# Patient Record
Sex: Female | Born: 1955
Health system: Southern US, Community
[De-identification: ages and names within clinical notes are randomized; demographics above are authoritative.]

## PROBLEM LIST (undated history)

## (undated) ENCOUNTER — Ambulatory Visit: Discharge status: Absent without leave | Payer: PPO

## (undated) DIAGNOSIS — E119 Type 2 diabetes mellitus without complications: Secondary | ICD-10-CM

## (undated) DIAGNOSIS — D509 Iron deficiency anemia, unspecified: Secondary | ICD-10-CM

## (undated) DIAGNOSIS — J449 Chronic obstructive pulmonary disease, unspecified: Secondary | ICD-10-CM

## (undated) DIAGNOSIS — I509 Heart failure, unspecified: Secondary | ICD-10-CM

## (undated) DIAGNOSIS — I739 Peripheral vascular disease, unspecified: Secondary | ICD-10-CM

## (undated) DIAGNOSIS — I493 Ventricular premature depolarization: Secondary | ICD-10-CM

## (undated) DIAGNOSIS — R0600 Dyspnea, unspecified: Secondary | ICD-10-CM

## (undated) DIAGNOSIS — E785 Hyperlipidemia, unspecified: Secondary | ICD-10-CM

## (undated) DIAGNOSIS — K219 Gastro-esophageal reflux disease without esophagitis: Secondary | ICD-10-CM

## (undated) DIAGNOSIS — Z8709 Personal history of other diseases of the respiratory system: Secondary | ICD-10-CM

## (undated) DIAGNOSIS — I10 Essential (primary) hypertension: Secondary | ICD-10-CM

## (undated) DIAGNOSIS — Z5189 Encounter for other specified aftercare: Secondary | ICD-10-CM

## (undated) DIAGNOSIS — Z9109 Other allergy status, other than to drugs and biological substances: Secondary | ICD-10-CM

## (undated) DIAGNOSIS — D649 Anemia, unspecified: Secondary | ICD-10-CM

## (undated) DIAGNOSIS — Z8719 Personal history of other diseases of the digestive system: Secondary | ICD-10-CM

## (undated) DIAGNOSIS — I471 Supraventricular tachycardia, unspecified: Secondary | ICD-10-CM

## (undated) DIAGNOSIS — R519 Headache, unspecified: Secondary | ICD-10-CM

## (undated) DIAGNOSIS — I2510 Atherosclerotic heart disease of native coronary artery without angina pectoris: Secondary | ICD-10-CM

## (undated) DIAGNOSIS — I429 Cardiomyopathy, unspecified: Secondary | ICD-10-CM

## (undated) DIAGNOSIS — M1990 Unspecified osteoarthritis, unspecified site: Secondary | ICD-10-CM

## (undated) DIAGNOSIS — F419 Anxiety disorder, unspecified: Secondary | ICD-10-CM

## (undated) DIAGNOSIS — I70223 Atherosclerosis of native arteries of extremities with rest pain, bilateral legs: Secondary | ICD-10-CM

## (undated) HISTORY — DX: Supraventricular tachycardia, unspecified: I47.10

## (undated) HISTORY — DX: Other allergy status, other than to drugs and biological substances: Z91.09

## (undated) HISTORY — DX: Encounter for other specified aftercare: Z51.89

## (undated) HISTORY — DX: Personal history of other diseases of the digestive system: Z87.19

## (undated) HISTORY — DX: Atherosclerosis of native arteries of extremities with rest pain, bilateral legs: I70.223

## (undated) HISTORY — DX: Chronic obstructive pulmonary disease, unspecified: J44.9

## (undated) HISTORY — DX: Type 2 diabetes mellitus without complications: E11.9

## (undated) HISTORY — DX: Heart failure, unspecified: I50.9

## (undated) HISTORY — DX: Atherosclerotic heart disease of native coronary artery without angina pectoris: I25.10

## (undated) HISTORY — DX: Hyperlipidemia, unspecified: E78.5

## (undated) HISTORY — DX: Supraventricular tachycardia: I47.1

## (undated) HISTORY — DX: Anemia, unspecified: D64.9

## (undated) HISTORY — PX: ABDOMINAL HYSTERECTOMY: SHX81

## (undated) HISTORY — DX: Anxiety disorder, unspecified: F41.9

## (undated) HISTORY — DX: Gastro-esophageal reflux disease without esophagitis: K21.9

## (undated) HISTORY — DX: Ventricular premature depolarization: I49.3

## (undated) HISTORY — DX: Iron deficiency anemia, unspecified: D50.9

---

## 1998-08-11 ENCOUNTER — Ambulatory Visit (HOSPITAL_BASED_OUTPATIENT_CLINIC_OR_DEPARTMENT_OTHER): Admission: RE | Admit: 1998-08-11 | Discharge: 1998-08-11 | Payer: Self-pay | Admitting: General Surgery

## 2001-05-09 ENCOUNTER — Other Ambulatory Visit: Admission: RE | Admit: 2001-05-09 | Discharge: 2001-05-09 | Payer: Self-pay | Admitting: Gynecology

## 2002-04-17 ENCOUNTER — Ambulatory Visit (HOSPITAL_COMMUNITY): Admission: RE | Admit: 2002-04-17 | Discharge: 2002-04-17 | Payer: Self-pay | Admitting: Family Medicine

## 2002-04-17 ENCOUNTER — Encounter: Payer: Self-pay | Admitting: Family Medicine

## 2003-12-03 ENCOUNTER — Other Ambulatory Visit: Admission: RE | Admit: 2003-12-03 | Discharge: 2003-12-03 | Payer: Self-pay | Admitting: Gynecology

## 2004-01-20 ENCOUNTER — Observation Stay (HOSPITAL_COMMUNITY): Admission: RE | Admit: 2004-01-20 | Discharge: 2004-01-21 | Payer: Self-pay | Admitting: Gynecology

## 2005-02-10 ENCOUNTER — Other Ambulatory Visit: Admission: RE | Admit: 2005-02-10 | Discharge: 2005-02-10 | Payer: Self-pay | Admitting: Gynecology

## 2012-03-07 ENCOUNTER — Encounter (HOSPITAL_COMMUNITY): Payer: Self-pay | Admitting: *Deleted

## 2012-03-07 ENCOUNTER — Emergency Department (HOSPITAL_COMMUNITY): Payer: 59

## 2012-03-07 ENCOUNTER — Emergency Department (HOSPITAL_COMMUNITY)
Admission: EM | Admit: 2012-03-07 | Discharge: 2012-03-07 | Disposition: A | Discharge status: Home Health | Payer: 59 | Attending: Emergency Medicine | Admitting: Emergency Medicine

## 2012-03-07 DIAGNOSIS — I10 Essential (primary) hypertension: Secondary | ICD-10-CM | POA: Insufficient documentation

## 2012-03-07 DIAGNOSIS — M542 Cervicalgia: Secondary | ICD-10-CM | POA: Insufficient documentation

## 2012-03-07 DIAGNOSIS — F17200 Nicotine dependence, unspecified, uncomplicated: Secondary | ICD-10-CM | POA: Insufficient documentation

## 2012-03-07 DIAGNOSIS — M25519 Pain in unspecified shoulder: Secondary | ICD-10-CM | POA: Insufficient documentation

## 2012-03-07 DIAGNOSIS — R079 Chest pain, unspecified: Secondary | ICD-10-CM | POA: Insufficient documentation

## 2012-03-07 MED ORDER — ONDANSETRON 4 MG PO TBDP
4.0000 mg | ORAL_TABLET | Freq: Once | ORAL | Status: AC
  Administered 2012-03-07: 4 mg via ORAL
  Filled 2012-03-07: qty 1

## 2012-03-07 MED ORDER — NAPROXEN 375 MG PO TABS: 375.0000 mg | ORAL_TABLET | Freq: Two times a day (BID) | ORAL | Status: DC

## 2012-03-07 MED ORDER — OXYCODONE-ACETAMINOPHEN 5-325 MG PO TABS
2.0000 | ORAL_TABLET | Freq: Once | ORAL | Status: AC
  Administered 2012-03-07: 2 via ORAL
  Filled 2012-03-07 (×2): qty 2

## 2012-03-07 NOTE — ED Provider Notes (Signed)
Medical screening examination/treatment/procedure(s) were performed by non-physician practitioner and as supervising physician I was immediately available for consultation/collaboration.   Dione Booze, MD 03/07/12 [phone]

## 2012-03-07 NOTE — Progress Notes (Signed)
Orthopedic Tech Progress Note Patient Details:  Tammy Mcpherson [birth year] [id number]  Ortho Devices Type of Ortho Device: Arm foam sling Ortho Device/Splint Interventions: Application   Cammer, Mickie Bail 03/07/2012, 12:33 PM

## 2012-03-07 NOTE — ED Notes (Signed)
Patient reports she has had left arm pain, neck pain, and chest pain for 3 days.  She denies trauma.   Patient denies sob

## 2012-03-07 NOTE — Discharge Instructions (Signed)
Come back to ED if you develop fever, rash, chills, worsening pain, numbness or tingling  Acromioclavicular Injuries The AC (acromioclavicular) joint is the joint in the shoulder where the collarbone (clavicle) meets the shoulder blade (scapula). The part of the shoulder blade connected to the collarbone is called the acromion. Common problems with and treatments for the [facility] joint are detailed below. ARTHRITIS Arthritis occurs when the joint has been injured and the smooth padding between the joints (cartilage) is lost. This is the wear and tear seen in most joints of the body if they have been overused. This causes the joint to produce pain and swelling which is worse with activity.  AC JOINT SEPARATION AC joint separation means that the ligaments connecting the acromion of the shoulder blade and collarbone have been damaged, and the two bones no longer line up. AC separations can be anywhere from mild to severe, and are "graded" depending upon which ligaments are torn and how badly they are torn.  Grade I Injury: the least damage is done, and the [facility] joint still lines up.   Grade II Injury: damage to the ligaments which reinforce the [facility] joint. In a Grade II injury, these ligaments are stretched but not entirely torn. When stressed, the [facility] joint becomes painful and unstable.   Grade III Injury: AC and secondary ligaments are completely torn, and the collarbone is no longer attached to the shoulder blade. This results in deformity; a prominence of the end of the clavicle.  AC JOINT FRACTURE AC joint fracture means that there has been a break in the bones of the [facility] joint, usually the end of the clavicle. TREATMENT TREATMENT OF AC ARTHRITIS  There is currently no way to replace the cartilage damaged by arthritis. The best way to improve the condition is to decrease the activities which aggravate the problem. Application of ice to the joint helps decrease pain and soreness (inflammation). The use of  non-steroidal anti-inflammatory medication is helpful.   If less conservative measures do not work, then cortisone shots (injections) may be used. These are anti-inflammatories; they decrease the soreness in the joint and swelling.   If non-surgical measures fail, surgery may be recommended. The procedure is generally removal of a portion of the end of the clavicle. This is the part of the collarbone closest to your acromion which is stabilized with ligaments to the acromion of the shoulder blade. This surgery may be performed using a tube-like instrument with a light (arthroscope) for looking into a joint. It may also be performed as an open surgery through a small incision by the surgeon. Most patients will have good range of motion within 6 weeks and may return to all activity including sports by 8-12 weeks, barring complications.  TREATMENT OF AN AC SEPARATION  The initial treatment is to decrease pain. This is best accomplished by immobilizing the arm in a sling and placing an ice pack to the shoulder for 20 to 30 minutes every 2 hours as needed. As the pain starts to subside, it is important to begin moving the fingers, wrist, elbow and eventually the shoulder in order to prevent a stiff or "frozen" shoulder. Instruction on when and how much to move the shoulder will be provided by your caregiver. The length of time needed to regain full motion and function depends on the amount or grade of the injury. Recovery from a Grade I AC separation usually takes 10 to 14 days, whereas a Grade III may take 6 to  8 weeks.   Grade I and II separations usually do not require surgery. Even Grade III injuries usually allow return to full activity with few restrictions. Treatment is also based on the activity demands of the injured shoulder. For example, a high level quarterback with an injured throwing arm will receive more aggressive treatment than someone with a desk job who rarely uses his/her arm for strenuous  activities. In some cases, a painful lump may persist which could require a later surgery. Surgery can be very successful, but the benefits must be weighed against the potential risks.  TREATMENT OF AN AC JOINT FRACTURE Fracture treatment depends on the type of fracture. Sometimes a splint or sling may be all that is required. Other times surgery may be required for repair. This is more frequently the case when the ligaments supporting the clavicle are completely torn. Your caregiver will help you with these decisions and together you can decide what will be the best treatment. HOME CARE INSTRUCTIONS   Apply ice to the injury for 15 to 20 minutes each hour while awake for 2 days. Put the ice in a plastic bag and place a towel between the bag of ice and skin.   If a sling has been applied, wear it constantly for as long as directed by your caregiver, even at night. The sling or splint can be removed for bathing or showering or as directed. Be sure to keep the shoulder in the same place as when the sling is on. Do not lift the arm.   If a figure-of-eight splint has been applied it should be tightened gently by another person every day. Tighten it enough to keep the shoulders held back. Allow enough room to place the index finger between the body and strap. Loosen the splint immediately if there is numbness or tingling in the hands.   Take over-the-counter or prescription medicines for pain, discomfort or fever as directed by your caregiver.   If you or your child has received a follow up appointment, it is very important to keep that appointment in order to avoid long term complications, chronic pain or disability.  SEEK MEDICAL CARE IF:   The pain is not relieved with medications.   There is increased swelling or discoloration that continues to get worse rather than better.   You or your child has been unable to follow up as instructed.   There is progressive numbness and tingling in the arm,  forearm or hand.  SEEK IMMEDIATE MEDICAL CARE IF:   The arm is numb, cold or pale.   There is increasing pain in the hand, forearm or fingers.  MAKE SURE YOU:   Understand these instructions.   Will watch your condition.   Will get help right away if you are not doing well or get worse.  Document Released: 06/28/2005 Document Revised: 09/07/2011 Document Reviewed: 12/21/2008 [facility] Patient Information 2012 [place], [place].Arthralgia Your caregiver has diagnosed you as suffering from an arthralgia. Arthralgia means there is pain in a joint. This can come from many reasons including:  Bruising the joint which causes soreness (inflammation) in the joint.   Wear and tear on the joints which occur as we grow older (osteoarthritis).   Overusing the joint.   Various forms of arthritis.   Infections of the joint.  Regardless of the cause of pain in your joint, most of these different pains respond to anti-inflammatory drugs and rest. The exception to this is when a joint is infected, and  these cases are treated with antibiotics, if it is a bacterial infection. HOME CARE INSTRUCTIONS   Rest the injured area for as long as directed by your caregiver. Then slowly start using the joint as directed by your caregiver and as the pain allows. Crutches as directed may be useful if the ankles, knees or hips are involved. If the knee was splinted or casted, continue use and care as directed. If an stretchy or elastic wrapping bandage has been applied today, it should be removed and re-applied every 3 to 4 hours. It should not be applied tightly, but firmly enough to keep swelling down. Watch toes and feet for swelling, bluish discoloration, coldness, numbness or excessive pain. If any of these problems (symptoms) occur, remove the ace bandage and re-apply more loosely. If these symptoms persist, contact your caregiver or return to this location.   For the first 24 hours, keep the injured extremity  elevated on pillows while lying down.   Apply ice for 15 to 20 minutes to the sore joint every couple hours while awake for the first half day. Then 3 to 4 times per day for the first 48 hours. Put the ice in a plastic bag and place a towel between the bag of ice and your skin.   Wear any splinting, casting, elastic bandage applications, or slings as instructed.   Only take over-the-counter or prescription medicines for pain, discomfort, or fever as directed by your caregiver. Do not use aspirin immediately after the injury unless instructed by your physician. Aspirin can cause increased bleeding and bruising of the tissues.   If you were given crutches, continue to use them as instructed and do not resume weight bearing on the sore joint until instructed.  Persistent pain and inability to use the sore joint as directed for more than 2 to 3 days are warning signs indicating that you should see a caregiver for a follow-up visit as soon as possible. Initially, a hairline fracture (break in bone) may not be evident on X-rays. Persistent pain and swelling indicate that further evaluation, non-weight bearing or use of the joint (use of crutches or slings as instructed), or further X-rays are indicated. X-rays may sometimes not show a small fracture until a week or 10 days later. Make a follow-up appointment with your own caregiver or one to whom we have referred you. A radiologist (specialist in reading X-rays) may read your X-rays. Make sure you know how you are to obtain your X-ray results. Do not assume everything is normal if you do not hear from [place]. SEEK MEDICAL CARE IF: Bruising, swelling, or pain increases. SEEK IMMEDIATE MEDICAL CARE IF:   Your fingers or toes are numb or blue.   The pain is not responding to medications and continues to stay the same or get worse.   The pain in your joint becomes severe.   You develop a fever over 102 F (38.9 C).   It becomes impossible to move or use the  joint.  MAKE SURE YOU:   Understand these instructions.   Will watch your condition.   Will get help right away if you are not doing well or get worse.  Document Released: 09/18/2005 Document Revised: 09/07/2011 Document Reviewed: 05/06/2008 [facility] Patient Information 2012 [place], [place].Shoulder Pain The shoulder is a ball and socket joint. The muscles and tendons (rotator cuff) are what keep the shoulder in its joint and stable. This collection of muscles and tendons holds in the head (ball) of the  humerus (upper arm bone) in the fossa (cup) of the scapula (shoulder blade). Today no reason was found for your shoulder pain. Often pain in the shoulder may be treated conservatively with temporary immobilization. For example, holding the shoulder in one place using a sling for rest. Physical therapy may be needed if problems continue. HOME CARE INSTRUCTIONS   Apply ice to the sore area for 15 to 20 minutes, 3 to 4 times per day for the first 2 days. Put the ice in a plastic bag. Place a towel between the bag of ice and your skin.   If you have or were given a shoulder sling and straps, do not remove for as long as directed by your caregiver or until you see a caregiver for a follow-up examination. If you need to remove it to shower or bathe, move your arm as little as possible.   Sleep on several pillows at night to lessen swelling and pain.   Only take over-the-counter or prescription medicines for pain, discomfort, or fever as directed by your caregiver.   Keep any follow-up appointments in order to avoid any type of permanent shoulder disability or chronic pain problems.  SEEK MEDICAL CARE IF:   Pain in your shoulder increases or new pain develops in your arm, hand, or fingers.   Your hand or fingers are colder than your other hand.   You do not obtain pain relief with the medications or your pain becomes worse.  SEEK IMMEDIATE MEDICAL CARE IF:   Your arm, hand, or fingers are numb  or tingling.   Your arm, hand, or fingers are swollen, painful, or turn [place] or blue.   You develop chest pain or shortness of breath.  MAKE SURE YOU:   Understand these instructions.   Will watch your condition.   Will get help right away if you are not doing well or get worse.  Document Released: 06/28/2005 Document Revised: 09/07/2011 Document Reviewed: 09/02/2011 [facility] Patient Information 2012 [place], [place].

## 2012-03-07 NOTE — ED Provider Notes (Signed)
History     CSN: [id number]  Arrival date & time 03/07/12  1012   First MD Initiated Contact with Patient 03/07/12 1017      Chief Complaint  Patient presents with  . Arm Pain  . Neck Pain  . Chest Pain    (Consider location/radiation/quality/duration/timing/severity/associated sxs/prior treatment) HPI  Patient to the ED with complaints of left shoulder pain and left wrist pain. She states that it started on Saturday morning when she rolled over in bed and then since then it has been continually getting worse. The patient does not want to move her arm because it hurts to bad. It hurts to touch and to move. She denies weakness and says she can pick things up but does not want to because it hurts too bad. She denies remembering falling or injuring her arm. She denies history of heart problems that she knows of. She is a smoker. No history of shoulder dislocations.No shortness of breath, no chest pain, no generalized weakness, fevers, N/V/D.  Past Medical History  Diagnosis Date  . Hypertension     Past Surgical History  Procedure Date  . Abdominal hysterectomy     No family history on file.  History  Substance Use Topics  . Smoking status: Current Everyday Smoker  . Smokeless tobacco: Not on file  . Alcohol Use: No    OB History    Grav Para Term Preterm Abortions TAB SAB Ect Mult Living                  Review of Systems   HEENT: denies blurry vision or change in hearing PULMONARY: Denies difficulty breathing and SOB CARDIAC: denies chest pain or heart palpitations MUSCULOSKELETAL:  denies being unable to ambulate ABDOMEN AL: denies abdominal pain GU: denies loss of bowel or urinary control NEURO: denies numbness and tingling in extremities SKIN: no new rashes PSYCH: patient behavior is normal NECK: pt does have some neck tenderness    Allergies  Penicillins  Home Medications   Current Outpatient Rx  Name Route Sig Dispense Refill  . NON FORMULARY  Oral Take 1 tablet by mouth daily.    Marland Kitchen NAPROXEN 375 MG PO TABS Oral Take 1 tablet (375 mg total) by mouth 2 (two) times daily. 20 tablet 0    BP 191/96  Pulse 101  Temp(Src) 98.7 F (37.1 C) (Oral)  Resp 16  Ht 5\' 4"  (1.626 m)  Wt 124 lb (56.246 kg)  BMI 21.28 kg/m2  SpO2 96%  Physical Exam  Nursing note and vitals reviewed. Constitutional: She appears well-developed and well-nourished. No distress.  HENT:  Head: Normocephalic and atraumatic.  Eyes: Pupils are equal, round, and reactive to light.  Neck: Normal range of motion. Neck supple.  Cardiovascular: Normal rate and regular rhythm.   Pulmonary/Chest: Effort normal.  Abdominal: Soft.  Musculoskeletal:       Left shoulder: She exhibits decreased range of motion, tenderness and pain. She exhibits no bony tenderness, no swelling, no effusion, no crepitus, no deformity, no laceration, no spasm, normal pulse and normal strength.       Pt has pain to palpation from her left shoulder all the way down through her fingers. Everywhere I touch on her left arm hurts.    Equal grip strength to bilateral upper extremities. Neurosensory  function adequate to both arms. Skin color is normal. Skin is warm and moist. I see no step off deformity of the neck. Pt is able to ambulate without limp.  Pain is relieved when at rest.  ROM is decreased due to pain. No crepitus, laceration, effusion, swelling.  Pulses are normal   Neurological: She is alert.  Skin: Skin is warm and dry.    ED Course  Procedures (including critical care time)  Labs Reviewed - No data to display Dg Shoulder Left  03/07/2012  *RADIOLOGY REPORT*  Clinical Data: Left neck, shoulder and arm pain.  No known injury.  LEFT SHOULDER - 2+ VIEW  Comparison: None.  Findings: Three views of the left shoulder demonstrate an artifact overlying the scapula on the frontal views.  Mild lower cervical spine degenerative changes.  Otherwise, normal appearing bones and soft tissues.   IMPRESSION: Normal appearing left shoulder.  Mild lower cervical spine degenerative changes.  Original Report Authenticated By: Darrol Angel, M.D.     1. Shoulder pain       MDM   Date: 03/07/2012  Rate: 114  Rhythm: sinus tachycardia  QRS Axis: normal  Intervals: normal  ST/T Wave abnormalities: nonspecific ST changes  Conduction Disutrbances:none  Narrative Interpretation: bi atrial enlargment  Old EKG Reviewed: none available  Patient given 2 x Percocet tabs in ED and her pain decreased some moderately.   Patient with left arm pain. No neurological deficits. Patient is ambulatory. No warning symptoms of  pain including:  h/o cancer, IVDU, recent trauma. No concern for cauda equina, epidural abscess, or other serious cause of back pain. Conservative measures such as rest, ice/heat and pain medicine indicated. Patient given referral to Orthopedist and told to call today to schedule an appointment for as soon as possible.  The patient given shoulder sling in ED and Rx for Naproxen TID. I discussed plan with patient in depth. She has been made aware not to stay in sling all day everyday and that she needs to do shoulder exercises at least 15 minutes a day t decrease risk of frozen shoulder.  NO futher imaging done at this time because of lack of neurological deficits and pain relieves with medication. Xray of shoulder shows some mild lower cervical degenerative changes.  I told her that she she gets worsening pain, any weakness, any numbness, any new symptoms to return to the ED sooner for re-evaluation.         Dorthula Matas, PA 03/07/12 1214

## 2012-03-07 NOTE — ED Notes (Signed)
Paged and spoke with Ortho 

## 2012-11-03 ENCOUNTER — Emergency Department (HOSPITAL_COMMUNITY): Payer: 59

## 2012-11-03 ENCOUNTER — Encounter (HOSPITAL_COMMUNITY): Payer: Self-pay | Admitting: Emergency Medicine

## 2012-11-03 ENCOUNTER — Emergency Department (HOSPITAL_COMMUNITY)
Admission: EM | Admit: 2012-11-03 | Discharge: 2012-11-03 | Disposition: A | Discharge status: Home / Self Care | Payer: 59 | Attending: Emergency Medicine | Admitting: Emergency Medicine

## 2012-11-03 DIAGNOSIS — R197 Diarrhea, unspecified: Secondary | ICD-10-CM | POA: Insufficient documentation

## 2012-11-03 DIAGNOSIS — R112 Nausea with vomiting, unspecified: Secondary | ICD-10-CM | POA: Insufficient documentation

## 2012-11-03 DIAGNOSIS — R059 Cough, unspecified: Secondary | ICD-10-CM | POA: Insufficient documentation

## 2012-11-03 DIAGNOSIS — J3489 Other specified disorders of nose and nasal sinuses: Secondary | ICD-10-CM | POA: Insufficient documentation

## 2012-11-03 DIAGNOSIS — E876 Hypokalemia: Secondary | ICD-10-CM | POA: Insufficient documentation

## 2012-11-03 DIAGNOSIS — R6883 Chills (without fever): Secondary | ICD-10-CM | POA: Insufficient documentation

## 2012-11-03 DIAGNOSIS — R5381 Other malaise: Secondary | ICD-10-CM | POA: Insufficient documentation

## 2012-11-03 DIAGNOSIS — E860 Dehydration: Secondary | ICD-10-CM

## 2012-11-03 DIAGNOSIS — R071 Chest pain on breathing: Secondary | ICD-10-CM | POA: Insufficient documentation

## 2012-11-03 DIAGNOSIS — F17200 Nicotine dependence, unspecified, uncomplicated: Secondary | ICD-10-CM | POA: Insufficient documentation

## 2012-11-03 DIAGNOSIS — R05 Cough: Secondary | ICD-10-CM | POA: Insufficient documentation

## 2012-11-03 DIAGNOSIS — I10 Essential (primary) hypertension: Secondary | ICD-10-CM | POA: Insufficient documentation

## 2012-11-03 LAB — BASIC METABOLIC PANEL
Calcium: 9.6 mg/dL (ref 8.4–10.5)
GFR calc Af Amer: >90 mL/min (ref >90)
GFR calc non Af Amer: >90 mL/min (ref >90)
Potassium: 2.8 mEq/L — ABNORMAL LOW (ref 3.5–5.1)
Sodium: 139 mEq/L (ref 135–145)

## 2012-11-03 LAB — URINALYSIS, ROUTINE W REFLEX MICROSCOPIC
Leukocytes, UA: NEGATIVE
Nitrite: NEGATIVE
Protein, ur: NEGATIVE mg/dL
Specific Gravity, Urine: <1.005 — ABNORMAL LOW (ref 1.005–1.030)
Urobilinogen, UA: 0.2 mg/dL (ref 0.0–1.0)

## 2012-11-03 LAB — CBC WITH DIFFERENTIAL/PLATELET
Basophils Relative: 0 % (ref 0–1)
Hemoglobin: 14.4 g/dL (ref 12.0–15.0)
Lymphs Abs: 1.3 10*3/uL (ref 0.7–4.0)
MCHC: 34.2 g/dL (ref 30.0–36.0)
Monocytes Relative: 16 % — ABNORMAL HIGH (ref 3–12)
Neutro Abs: 5.5 10*3/uL (ref 1.7–7.7)
Neutrophils Relative %: 68 % (ref 43–77)
Platelets: 360 10*3/uL (ref 150–400)
RBC: 4.81 MIL/uL (ref 3.87–5.11)

## 2012-11-03 LAB — URINE MICROSCOPIC-ADD ON

## 2012-11-03 MED ORDER — KETOROLAC TROMETHAMINE 30 MG/ML IJ SOLN
30.0000 mg | Freq: Once | INTRAMUSCULAR | Status: AC
  Administered 2012-11-03: 30 mg via INTRAVENOUS
  Filled 2012-11-03: qty 1

## 2012-11-03 MED ORDER — OSELTAMIVIR PHOSPHATE 75 MG PO CAPS: 75.0000 mg | ORAL_CAPSULE | Freq: Two times a day (BID) | ORAL | Status: DC

## 2012-11-03 MED ORDER — POTASSIUM CHLORIDE CRYS ER 20 MEQ PO TBCR
EXTENDED_RELEASE_TABLET | ORAL | Status: AC
  Filled 2012-11-03: qty 2

## 2012-11-03 MED ORDER — MUCINEX DM 30-600 MG PO TB12: ORAL_TABLET | ORAL | Status: DC

## 2012-11-03 MED ORDER — SODIUM CHLORIDE 0.9 % IV SOLN: 1000.0000 mL | INTRAVENOUS | Status: DC

## 2012-11-03 MED ORDER — SODIUM CHLORIDE 0.9 % IV SOLN
1000.0000 mL | Freq: Once | INTRAVENOUS | Status: AC
  Administered 2012-11-03: 1000 mL via INTRAVENOUS

## 2012-11-03 MED ORDER — LOPERAMIDE HCL 2 MG PO CAPS
4.0000 mg | ORAL_CAPSULE | Freq: Once | ORAL | Status: AC
Start: 2012-11-03 — End: 2012-11-03
  Administered 2012-11-03: 4 mg via ORAL
  Filled 2012-11-03: qty 2

## 2012-11-03 MED ORDER — ONDANSETRON 8 MG PO TBDP: 8.0000 mg | ORAL_TABLET | Freq: Three times a day (TID) | ORAL | Status: DC | PRN

## 2012-11-03 MED ORDER — POTASSIUM CHLORIDE ER 10 MEQ PO TBCR: 20.0000 meq | EXTENDED_RELEASE_TABLET | Freq: Two times a day (BID) | ORAL | Status: DC

## 2012-11-03 MED ORDER — POTASSIUM CHLORIDE CRYS ER 20 MEQ PO TBCR
40.0000 meq | EXTENDED_RELEASE_TABLET | Freq: Once | ORAL | Status: AC
  Administered 2012-11-03: 40 meq via ORAL

## 2012-11-03 MED ORDER — ONDANSETRON HCL 4 MG/2ML IJ SOLN
4.0000 mg | Freq: Once | INTRAMUSCULAR | Status: AC
  Administered 2012-11-03: 4 mg via INTRAVENOUS
  Filled 2012-11-03: qty 2

## 2012-11-03 MED ORDER — POTASSIUM CHLORIDE CRYS ER 20 MEQ PO TBCR: 40.0000 meq | EXTENDED_RELEASE_TABLET | Freq: Once | ORAL | Status: DC

## 2012-11-03 NOTE — ED Notes (Signed)
Pt c/o flu like sx x 3 days-n/v/cough/chills with central cp starting this am.

## 2012-11-03 NOTE — ED Notes (Signed)
Will wait approx after zofran given to give loperamide due to nausea.

## 2012-11-03 NOTE — ED Provider Notes (Addendum)
History   This chart was scribed for Ward Givens, MD by Melba Coon, ED Scribe. The patient was seen in room APA19/APA19 and the patient's care was started at 4:38PM.    CSN: [id number]  Arrival date & time 11/03/12  1616   First MD Initiated Contact with Patient 11/03/12 1622      Chief Complaint  Patient presents with  . Cough  . Emesis  . Chest Pain    (Consider location/radiation/quality/duration/timing/severity/associated sxs/prior treatment) The history is provided by the patient. No language interpreter was used.   Tammy Mcpherson is a 57 y.o. female who presents to the Emergency Department complaining of constant, moderate to severe chest pain with some persistent dry coughing, nausea and emesis 5-6 times a day, and watery diarrhea 5-6 times a day with an onset 2 days ago. She reports her chest pain is induced from and aggravated by her coughing; change in body position also aggravates her chest pain. She reports generalized weakness, clear rhinorrhea, and decreased appetite. She reports chills but has not checked her body temperature at home. Her husband had similar symptoms last week and got better when Tammy Mcpherson became sick. Neither Tammy Mcpherson nor her husband has received the flu shot this season. Denies HA, fever, neck pain, sore throat, rash, back pain, abdominal pain, dysuria, or extremity pain, edema, weakness, numbness, or tingling. She reports history of  hypertension but never takes her medications. No other pertinent medical symptoms.  PCP: Tammy Mcpherson (years ago)   Past Medical History  Diagnosis Date  . Hypertension     Past Surgical History  Procedure Date  . Abdominal hysterectomy     No family history on file.  History  Substance Use Topics  . Smoking status: Current Every Day Smoker  . Smokeless tobacco: Not on file  . Alcohol Use: No  She smokes about half a pack a day She works in a warehouse. Lives at home Lives with  spouse  OB History    Grav Para Term Preterm Abortions TAB SAB Ect Mult Living                  Review of Systems  Respiratory: Positive for cough.   Cardiovascular: Positive for chest pain.  Gastrointestinal: Positive for vomiting.   10 Systems reviewed and all are negative for acute change except as noted in the HPI.   Allergies  Penicillins  Home Medications   Current Outpatient Rx  Name  Route  Sig  Dispense  Refill  . PHENYLEPH-CPM-DM-APAP 01-31-09-250 MG PO TBEF   Oral   Take 2 tablets by mouth every 6 (six) hours as needed. Cold/flu           BP 174/68  Pulse 91  Temp 98.1 F (36.7 C) (Oral)  Resp 20  Ht 5\' 5"  (1.651 m)  Wt 126 lb (57.153 kg)  BMI 20.97 kg/m2  SpO2 99%  Vital signs normal except hypertension   Physical Exam  Constitutional: She is oriented to person, place, and time. She appears well-developed and well-nourished.  Non-toxic appearance. She does not appear ill. No distress.  HENT:  Head: Normocephalic and atraumatic.  Right Ear: External ear normal.  Left Ear: External ear normal.  Nose: Nose normal. No mucosal edema or rhinorrhea.  Mouth/Throat: Oropharynx is clear and moist and mucous membranes are normal. No dental abscesses or uvula swelling.       Edentulous. Dry tongue.   Eyes: Conjunctivae normal and EOM  are normal. Pupils are equal, round, and reactive to light.  Neck: Normal range of motion and full passive range of motion without pain. Neck supple.  Cardiovascular: Normal rate, regular rhythm and normal heart sounds.  Exam reveals no gallop and no friction rub.   No murmur heard. Pulmonary/Chest: Effort normal and breath sounds normal. No respiratory distress. She has no wheezes. She has no rhonchi. She has no rales. She exhibits no tenderness and no crepitus.    Abdominal: Soft. Normal appearance and bowel sounds are normal. She exhibits no distension. There is tenderness. There is no rebound and no guarding.       TTP lower  costochondral joints.  Musculoskeletal: Normal range of motion. She exhibits no edema and no tenderness.       Moves all extremities well.   Neurological: She is alert and oriented to person, place, and time. She has normal strength. No cranial nerve deficit.  Skin: Skin is warm, dry and intact. No rash noted. No erythema. No pallor.  Psychiatric: She has a normal mood and affect. Her speech is normal and behavior is normal. Her mood appears not anxious.    ED Course  Procedures (including critical care time)   Medications  0.9 %  sodium chloride infusion (0 mL Intravenous Stopped 11/03/12 1754)    Followed by  0.9 %  sodium chloride infusion (1000 mL Intravenous New Bag/Given 11/03/12 1753)    Followed by  0.9 %  sodium chloride infusion (not administered)  potassium chloride SA (K-DUR,KLOR-CON) CR tablet 40 mEq (not administered)  ondansetron (ZOFRAN) injection 4 mg (4 mg Intravenous Given 11/03/12 1711)  loperamide (IMODIUM) capsule 4 mg (4 mg Oral Given 11/03/12 1753)  ketorolac (TORADOL) 30 MG/ML injection 30 mg (30 mg Intravenous Given 11/03/12 1711)     DIAGNOSTIC STUDIES: Oxygen Saturation is 99% on room air, normal by my interpretation.    COORDINATION OF CARE:  4:43PM - EKG, IV fluids, Zofran, imodium, Toradol, CXR, BMP, CBC with differential, and UA will be ordered for Tammy Mcpherson.   6:42PM - recheck; imaging and lab results reviewed.Imaging was unremarkable except for hypokalemia. Tammy Mcpherson was normal. K is low. Her condition is improved with increased voiding after 2 bags of IV fluids. She will be Rx tamiflu and K supplement pills. She is ready for d/c. Pt feeling better. Feels ready to go home.     Results for orders placed during the hospital encounter of 11/03/12  BASIC METABOLIC PANEL      Component Value Range   Sodium 139  135 - 145 mEq/L   Potassium 2.8 (*) 3.5 - 5.1 mEq/L   Chloride 101  96 - 112 mEq/L   CO2 24  19 - 32 mEq/L   Glucose, Bld 112 (*) 70 - 99  mg/dL   BUN 8  6 - 23 mg/dL   Creatinine, Ser 1.61  0.50 - 1.10 mg/dL   Calcium 9.6  8.4 - 09.6 mg/dL   GFR calc non Af Amer >90  >90 mL/min   GFR calc Af Amer >90  >90 mL/min  CBC WITH DIFFERENTIAL      Component Value Range   WBC 8.1  4.0 - 10.5 K/uL   RBC 4.81  3.87 - 5.11 MIL/uL   Hemoglobin 14.4  12.0 - 15.0 g/dL   HCT 04.5  40.9 - 81.1 %   MCV 87.5  78.0 - 100.0 fL   MCH 29.9  26.0 - 34.0 pg   MCHC  34.2  30.0 - 36.0 g/dL   RDW 16.1  09.6 - 04.5 %   Platelets 360  150 - 400 K/uL   Neutrophils Relative 68  43 - 77 %   Neutro Abs 5.5  1.7 - 7.7 K/uL   Lymphocytes Relative 16  12 - 46 %   Lymphs Abs 1.3  0.7 - 4.0 K/uL   Monocytes Relative 16 (*) 3 - 12 %   Monocytes Absolute 1.3 (*) 0.1 - 1.0 K/uL   Eosinophils Relative 0  0 - 5 %   Eosinophils Absolute 0.0  0.0 - 0.7 K/uL   Basophils Relative 0  0 - 1 %   Basophils Absolute 0.0  0.0 - 0.1 K/uL  URINALYSIS, ROUTINE W REFLEX MICROSCOPIC      Component Value Range   Color, Urine STRAW (*) YELLOW   APPearance CLEAR  CLEAR   Specific Gravity, Urine <1.005 (*) 1.005 - 1.030   pH 6.5  5.0 - 8.0   Glucose, UA NEGATIVE  NEGATIVE mg/dL   Hgb urine dipstick TRACE (*) NEGATIVE   Bilirubin Urine NEGATIVE  NEGATIVE   Ketones, ur NEGATIVE  NEGATIVE mg/dL   Protein, ur NEGATIVE  NEGATIVE mg/dL   Urobilinogen, UA 0.2  0.0 - 1.0 mg/dL   Nitrite NEGATIVE  NEGATIVE   Leukocytes, UA NEGATIVE  NEGATIVE  URINE MICROSCOPIC-ADD ON      Component Value Range   Squamous Epithelial / LPF FEW (*) RARE   WBC, UA 0-2  <3 WBC/hpf   RBC / HPF 0-2  <3 RBC/hpf   Laboratory interpretation all normal except hypokalemia   Dg Chest 2 View  11/03/2012  *RADIOLOGY REPORT*  Clinical Data: Cough.  Emesis.  Chest pain.  Weakness and vomiting.  CHEST - 2 VIEW  Comparison: None.  Findings:  Cardiopericardial silhouette within normal limits. Mediastinal contours normal. Trachea midline.  No airspace disease or effusion.  IMPRESSION: No active cardiopulmonary  disease.   Original Report Authenticated By: Andreas Newport, M.D.     Date: 11/03/2012  Rate: 106  Rhythm: sinus tachycardia  QRS Axis: normal  Intervals: normal  ST/T Wave abnormalities: normal  Conduction Disutrbances:none  Narrative Interpretation:   Old EKG Reviewed: none available      1. Influenza-like illness   2. Vomiting and diarrhea   3. Hypokalemia   4. Dehydration    New Prescriptions   DEXTROMETHORPHAN-GUAIFENESIN (MUCINEX DM) 30-600 MG TB12    1 po BID for coughing   ONDANSETRON (ZOFRAN ODT) 8 MG DISINTEGRATING TABLET    Take 1 tablet (8 mg total) by mouth every 8 (eight) hours as needed for nausea.   OSELTAMIVIR (TAMIFLU) 75 MG CAPSULE    Take 1 capsule (75 mg total) by mouth 2 (two) times daily.   POTASSIUM CHLORIDE (K-DUR) 10 MEQ TABLET    Take 2 tablets (20 mEq total) by mouth 2 (two) times daily.    Plan discharge  Devoria Albe, MD, FACEP    MDM  I personally performed the services described in this documentation, which was scribed in my presence. The recorded information has been reviewed and considered.  Devoria Albe, MD, FACEP         Ward Givens, MD 11/03/12 Avelino Leeds  Ward Givens, MD 11/03/12 [phone]

## 2012-11-03 NOTE — ED Provider Notes (Signed)
We also discussed her hypertension and that it is the "silent killer" and she needs to get back with her doctor and take her BP meds.   Ward Givens, MD 11/03/12 1901

## 2013-06-03 ENCOUNTER — Other Ambulatory Visit: Payer: Self-pay | Admitting: Podiatry

## 2013-06-19 NOTE — Addendum Note (Signed)
Addended by: Kaydenn Mclear on: 06/19/2013 01:01 PM   Modules accepted: Orders  

## 2013-06-24 ENCOUNTER — Other Ambulatory Visit: Payer: Self-pay | Admitting: Podiatry

## 2013-06-27 ENCOUNTER — Encounter (HOSPITAL_COMMUNITY): Payer: Self-pay

## 2013-06-30 NOTE — Patient Instructions (Addendum)
Tammy Mcpherson  06/30/2013   Your procedure is scheduled on:  07/10/13  [facility] to [facility] [place] [facility].  Call this number if you have problems the morning of surgery: [phone]   Remember:   Do not eat food or drink liquids after midnight.   Take these medicines the morning of surgery with A SIP OF WATER: none   Do not wear jewelry, make-up or nail polish.  Do not wear lotions, powders, or perfumes. You may wear deodorant.  Do not shave 48 hours prior to surgery. Men may shave face and neck.  Do not bring valuables to the hospital.  [facility] is not responsible                  for any belongings or valuables.               Contacts, dentures or bridgework may not be worn into surgery.  Leave suitcase in the car. After surgery it may be brought to your room.  For patients admitted to the hospital, discharge time is determined by your                treatment team.               Patients discharged the day of surgery will not be allowed to drive  home.  Name and phone number of your driver: family  Special Instructions: Shower using CHG 2 nights before surgery and the night before surgery.  If you shower the day of surgery use CHG.  Use special wash - you have one bottle of CHG for all showers.  You should use approximately 1/3 of the bottle for each shower.   Please read over the following fact sheets that you were given: Pain Booklet, Coughing and Deep Breathing, Surgical Site Infection Prevention and Anesthesia Post-op Instructions   PATIENT INSTRUCTIONS POST-ANESTHESIA  IMMEDIATELY FOLLOWING SURGERY:  Do not drive or operate machinery for the first twenty four hours after surgery.  Do not make any important decisions for twenty four hours after surgery or while taking narcotic pain medications or sedatives.  If you develop intractable nausea and vomiting or a severe headache please notify your doctor immediately.  FOLLOW-UP:  Please make an appointment with your surgeon as  instructed. You do not need to follow up with anesthesia unless specifically instructed to do so.  WOUND CARE INSTRUCTIONS (if applicable):  Keep a dry clean dressing on the anesthesia/puncture wound site if there is drainage.  Once the wound has quit draining you may leave it open to air.  Generally you should leave the bandage intact for twenty four hours unless there is drainage.  If the epidural site drains for more than 36-48 hours please call the anesthesia department.  QUESTIONS?:  Please feel free to call your physician or the hospital operator if you have any questions, and they will be happy to assist you.      Bunionectomy A bunionectomy is surgery to remove a bunion. A bunion is an enlargement of the joint at the base of the big toe. It is made up of bone and soft tissue on the inside part of the joint. Over time, a painful lump appears on the inside of the joint. The big toe begins to point inward toward the second toe. New bone growth can occur and a bone spur may form. The pain eventually causes difficulty walking. A bunion usually results from inflammation caused by the irritation  of poorly fitting shoes. It often begins later in life. A bunionectomy is performed when nonsurgical treatment no longer works. When surgery is needed, the extent of the procedure will depend on the degree of deformity of the foot. Your surgeon will discuss with you the different procedures and what will work best for you depending on your age and health. LET YOUR CAREGIVER KNOW ABOUT:   Previous problems with anesthetics or medicines used to numb the skin.  Allergies to dyes, iodine, foods, and/or latex.  Medicines taken including herbs, eye drops, prescription medicines (especially medicines used to "thin the blood"), aspirin and other over-the-counter medicines, and steroids (by mouth or as a cream).  History of bleeding or blood problems.  Possibility of pregnancy, if this applies.  History of  blood clots in your legs and/or lungs .  Previous surgery.  Other important health problems. RISKS AND COMPLICATIONS   Infection.  Pain.  Nerve damage.  Possibility that the bunion will recur. BEFORE THE PROCEDURE  You should be present 60 minutes prior to your procedure or as directed.  PROCEDURE  Surgery is often done so that you can go home the same day (outpatient). It may be done in a hospital or in an outpatient surgical center. An anesthetic will be used to help you sleep during the procedure. Sometimes, a spinal anesthetic is used to make you numb below the waist. A cut (incision) is made over the swollen area at the first joint of the big toe. The enlarged lump will be removed. If there is a need to reposition the bones of the big toe, this may require more than 1 incision. The bone itself may need to be cut. Screws and wires may be used in the repair. These can be removed at a later date. In severe cases, the entire joint may need to be removed and a joint replacement inserted. When done, the incision is closed with stitches (sutures). Skin adhesive strips may be added for reinforcement. They help hold the incision closed.  AFTER THE PROCEDURE  Compression bandages (dressings) are then wrapped around the wound. This helps to keep the foot in alignment and reduce swelling. Your foot will be monitored for bleeding and swelling. You will need to stay for a few hours in the recovery area before being discharged. This allows time for the anesthesia to wear off. You will be discharged home when you are awake, stable, and doing well. HOME CARE INSTRUCTIONS   You can expect to return to normal activities within 6 to 8 weeks after surgery. The foot is at increased risk for swelling for several months. When you can expect to bear weight on the operated foot will depend on the extent of your surgery. The milder the deformity, the less tissue is removed and the sooner the return to normal  activity level. During the recovery period, a special shoe, boot, or cast may be worn to accommodate the surgical bandage and to help provide stability to the foot.  Once you are home, an ice pack applied to the operative site may help with discomfort and keep swelling down. Stop using the ice if it causes discomfort.  Keep your feet raised (elevated) when possible to lessen swelling.  If you have an elastic bandage on your foot and you have numbness, tingling, or your foot becomes cold and blue, adjust the bandage to make it comfortable.  Change dressings as directed.  Keep the wound dry and clean. The wound may be washed  gently with soap and water. Gently blot dry without rubbing. Do not take baths or use swimming pools or hot tubs for 10 days, or as instructed by your caregiver.  Only take over-the-counter or prescription medicines for pain, discomfort, or fever as directed by your caregiver.  You may continue a normal diet as directed.  For activity, use crutches with no weight bearing or your orthopedic shoe as directed. Continue to use crutches or a cane as directed until you can stand without causing pain. SEEK MEDICAL CARE IF:   You have redness, swelling, bruising, or increasing pain in the wound.  There is pus coming from the wound.  You have drainage from a wound lasting longer than 1 day.  You have an oral temperature above 102 F (38.9 C).  You notice a bad smell coming from the wound or dressing.  The wound breaks open after sutures have been removed.  You develop dizzy episodes or fainting while standing.  You have persistent nausea or vomiting.  Your toes become cold.  Pain is not relieved with medicines. SEEK IMMEDIATE MEDICAL CARE IF:   You develop a rash.  You have difficulty breathing.  You develop any reaction or side effects to medicines given.  Your toes are numb or blue, or you have severe pain. MAKE SURE YOU:   Understand these  instructions.  Will watch your condition.  Will get help right away if you are not doing well or get worse. Document Released: 09/01/2005 Document Revised: 12/11/2011 Document Reviewed: 10/07/2007 [facility] Patient Information 2014 [place], [place].

## 2013-07-01 ENCOUNTER — Encounter (HOSPITAL_COMMUNITY): Payer: Self-pay

## 2013-07-01 ENCOUNTER — Ambulatory Visit (HOSPITAL_COMMUNITY): Payer: 59 | Admitting: *Deleted

## 2013-07-01 ENCOUNTER — Encounter (HOSPITAL_COMMUNITY)
Admission: RE | Admit: 2013-07-01 | Discharge: 2013-07-01 | Disposition: A | Discharge status: Home / Self Care | Payer: 59 | Source: Ambulatory Visit | Attending: Podiatry | Admitting: Podiatry

## 2013-07-01 DIAGNOSIS — Z01812 Encounter for preprocedural laboratory examination: Secondary | ICD-10-CM | POA: Insufficient documentation

## 2013-07-01 DIAGNOSIS — Z01818 Encounter for other preprocedural examination: Secondary | ICD-10-CM | POA: Insufficient documentation

## 2013-07-01 LAB — BASIC METABOLIC PANEL
BUN: 11 mg/dL (ref 6–23)
CO2: 24 mEq/L (ref 19–32)
Calcium: 10.3 mg/dL (ref 8.4–10.5)
Chloride: 103 mEq/L (ref 96–112)
Creatinine, Ser: 0.53 mg/dL (ref 0.50–1.10)
Glucose, Bld: 99 mg/dL (ref 70–99)

## 2013-07-01 LAB — HEMOGLOBIN AND HEMATOCRIT, BLOOD: HCT: 43.3 % (ref 36.0–46.0)

## 2013-07-01 NOTE — Progress Notes (Signed)
Dr Jayme Cloud and Dr Nolen Mu notified of High BP during PAT appointment  (rt arm 200/110, 203/108 10 mins later, 225/117 20 mins later)  (Left arm 190/100, 10 mins later 190/108, 20 mins later 188/104.  Pt to see PA at [facility] care ASAP.  Katelyn Bowen notified at [facility] to get patient seen ASAP.

## 2013-07-10 ENCOUNTER — Encounter (HOSPITAL_COMMUNITY)
Admission: RE | Disposition: A | Discharge status: Home / Self Care | Payer: Self-pay | Source: Ambulatory Visit | Attending: Podiatry

## 2013-07-10 ENCOUNTER — Encounter (HOSPITAL_COMMUNITY): Payer: Self-pay | Admitting: *Deleted

## 2013-07-10 ENCOUNTER — Encounter (HOSPITAL_COMMUNITY): Payer: 59 | Admitting: Anesthesiology

## 2013-07-10 ENCOUNTER — Ambulatory Visit (HOSPITAL_COMMUNITY): Payer: 59

## 2013-07-10 ENCOUNTER — Ambulatory Visit (HOSPITAL_COMMUNITY): Payer: 59 | Admitting: Anesthesiology

## 2013-07-10 ENCOUNTER — Ambulatory Visit (HOSPITAL_COMMUNITY)
Admission: RE | Admit: 2013-07-10 | Discharge: 2013-07-10 | Disposition: A | Discharge status: Home / Self Care | Payer: 59 | Source: Ambulatory Visit | Attending: Podiatry | Admitting: Podiatry

## 2013-07-10 DIAGNOSIS — M2040 Other hammer toe(s) (acquired), unspecified foot: Secondary | ICD-10-CM | POA: Insufficient documentation

## 2013-07-10 DIAGNOSIS — M2011 Hallux valgus (acquired), right foot: Secondary | ICD-10-CM

## 2013-07-10 DIAGNOSIS — I10 Essential (primary) hypertension: Secondary | ICD-10-CM | POA: Insufficient documentation

## 2013-07-10 DIAGNOSIS — M2010 Hallux valgus (acquired), unspecified foot: Secondary | ICD-10-CM | POA: Insufficient documentation

## 2013-07-10 DIAGNOSIS — M24573 Contracture, unspecified ankle: Secondary | ICD-10-CM | POA: Insufficient documentation

## 2013-07-10 DIAGNOSIS — Z79899 Other long term (current) drug therapy: Secondary | ICD-10-CM | POA: Insufficient documentation

## 2013-07-10 HISTORY — PX: BUNIONECTOMY: SHX129

## 2013-07-10 HISTORY — PX: AIKEN OSTEOTOMY: SHX6331

## 2013-07-10 HISTORY — PX: PROXIMAL INTERPHALANGEAL FUSION (PIP): SHX6043

## 2013-07-10 HISTORY — PX: METATARSAL HEAD EXCISION: SHX5027

## 2013-07-10 SURGERY — BUNIONECTOMY
Anesthesia: Monitor Anesthesia Care | Site: Foot | Laterality: Right | Wound class: Clean

## 2013-07-10 MED ORDER — MIDAZOLAM HCL 2 MG/2ML IJ SOLN
INTRAMUSCULAR | Status: AC
  Filled 2013-07-10: qty 2

## 2013-07-10 MED ORDER — FENTANYL CITRATE 0.05 MG/ML IJ SOLN
25.0000 ug | INTRAMUSCULAR | Status: AC
  Administered 2013-07-10 (×2): 25 ug via INTRAVENOUS

## 2013-07-10 MED ORDER — CLINDAMYCIN PHOSPHATE 600 MG/50ML IV SOLN
INTRAVENOUS | Status: AC
  Filled 2013-07-10: qty 50

## 2013-07-10 MED ORDER — LIDOCAINE HCL (PF) 1 % IJ SOLN
INTRAMUSCULAR | Status: AC
  Filled 2013-07-10: qty 30

## 2013-07-10 MED ORDER — BUPIVACAINE HCL (PF) 0.5 % IJ SOLN
INTRAMUSCULAR | Status: AC
  Filled 2013-07-10: qty 30

## 2013-07-10 MED ORDER — FENTANYL CITRATE 0.05 MG/ML IJ SOLN
INTRAMUSCULAR | Status: DC | PRN
  Administered 2013-07-10 (×4): 25 ug via INTRAVENOUS

## 2013-07-10 MED ORDER — FENTANYL CITRATE 0.05 MG/ML IJ SOLN
INTRAMUSCULAR | Status: AC
  Filled 2013-07-10: qty 2

## 2013-07-10 MED ORDER — CLINDAMYCIN PHOSPHATE 600 MG/50ML IV SOLN
600.0000 mg | Freq: Once | INTRAVENOUS | Status: AC
  Administered 2013-07-10: 600 mg via INTRAVENOUS

## 2013-07-10 MED ORDER — BUPIVACAINE HCL (PF) 0.5 % IJ SOLN
INTRAMUSCULAR | Status: DC | PRN
  Administered 2013-07-10: 10 mL
  Administered 2013-07-10: 20 mL

## 2013-07-10 MED ORDER — ONDANSETRON HCL 4 MG/2ML IJ SOLN: 4.0000 mg | Freq: Once | INTRAMUSCULAR | Status: DC | PRN

## 2013-07-10 MED ORDER — PROPOFOL 10 MG/ML IV EMUL
INTRAVENOUS | Status: AC
  Filled 2013-07-10: qty 20

## 2013-07-10 MED ORDER — MIDAZOLAM HCL 5 MG/5ML IJ SOLN
INTRAMUSCULAR | Status: DC | PRN
  Administered 2013-07-10: 2 mg via INTRAVENOUS

## 2013-07-10 MED ORDER — FENTANYL CITRATE 0.05 MG/ML IJ SOLN: 25.0000 ug | INTRAMUSCULAR | Status: DC | PRN

## 2013-07-10 MED ORDER — LACTATED RINGERS IV SOLN
INTRAVENOUS | Status: DC
  Administered 2013-07-10: 07:00:00 via INTRAVENOUS

## 2013-07-10 MED ORDER — MIDAZOLAM HCL 2 MG/2ML IJ SOLN
1.0000 mg | INTRAMUSCULAR | Status: DC | PRN
  Administered 2013-07-10: 2 mg via INTRAVENOUS

## 2013-07-10 MED ORDER — SODIUM CHLORIDE 0.9 % IR SOLN
Status: DC | PRN
  Administered 2013-07-10: 1000 mL

## 2013-07-10 MED ORDER — PROPOFOL INFUSION 10 MG/ML OPTIME
INTRAVENOUS | Status: DC | PRN
  Administered 2013-07-10: 65 ug/kg/min via INTRAVENOUS
  Administered 2013-07-10: 60 ug/kg/min via INTRAVENOUS

## 2013-07-10 SURGICAL SUPPLY — 83 items
APL SKNCLS STERI-STRIP NONHPOA (GAUZE/BANDAGES/DRESSINGS) ×1
BAG HAMPER (MISCELLANEOUS) ×2 IMPLANT
BANDAGE CONFORM 2  STR LF (GAUZE/BANDAGES/DRESSINGS) ×2 IMPLANT
BANDAGE ELASTIC 3 VELCRO NS (GAUZE/BANDAGES/DRESSINGS) ×1 IMPLANT
BANDAGE ELASTIC 4 VELCRO NS (GAUZE/BANDAGES/DRESSINGS) ×2 IMPLANT
BANDAGE ELASTIC 6 VELCRO NS (GAUZE/BANDAGES/DRESSINGS) ×2 IMPLANT
BANDAGE ESMARK 4X12 BL STRL LF (DISPOSABLE) ×1 IMPLANT
BANDAGE GAUZE ELAST BULKY 4 IN (GAUZE/BANDAGES/DRESSINGS) ×2 IMPLANT
BENZOIN TINCTURE PRP APPL 2/3 (GAUZE/BANDAGES/DRESSINGS) ×2 IMPLANT
BIT DRILL CANN 2.7 (BIT) ×2
BIT DRILL SRG 2.7XCANN AO CPLG (BIT) IMPLANT
BIT DRL SRG 2.7XCANN AO CPLNG (BIT) ×1
BLADE AVERAGE 25X9 (BLADE) ×3 IMPLANT
BLADE OSC/SAGITTAL MD 5.5X18 (BLADE) ×2 IMPLANT
BLADE OSC/SAGITTAL MD 9X18.5 (BLADE) IMPLANT
BLADE SURG 15 STRL LF DISP TIS (BLADE) ×2 IMPLANT
BLADE SURG 15 STRL SS (BLADE) ×6
BNDG CMPR 12X4 ELC STRL LF (DISPOSABLE) ×1
BNDG ESMARK 4X12 BLUE STRL LF (DISPOSABLE) ×2
CAP PIN PROTECTOR ORTHO WHT (CAP) IMPLANT
CHLORAPREP W/TINT 26ML (MISCELLANEOUS) ×2 IMPLANT
CLIP EZ FIXATION 10X10X10 (Staple) ×1 IMPLANT
CLOTH BEACON ORANGE TIMEOUT ST (SAFETY) ×2 IMPLANT
COVER LIGHT HANDLE STERIS (MISCELLANEOUS) ×4 IMPLANT
CUFF TOURN SGL LL 12 (TOURNIQUET CUFF) ×1 IMPLANT
CUFF TOURNIQUET SINGLE 18IN (TOURNIQUET CUFF) ×2 IMPLANT
CUFF TOURNIQUET SINGLE 24IN (TOURNIQUET CUFF) ×1 IMPLANT
DECANTER SPIKE VIAL GLASS SM (MISCELLANEOUS) ×2 IMPLANT
DRAIN TLS ROUND 10FR (DRAIN) IMPLANT
DRAPE OEC MINIVIEW 54X84 (DRAPES) ×2 IMPLANT
DRILL BIT ALLOFIX 2.0MM (BIT) IMPLANT
DRSG ADAPTIC 3X8 NADH LF (GAUZE/BANDAGES/DRESSINGS) ×2 IMPLANT
DURA STEPPER LG (CAST SUPPLIES) IMPLANT
DURA STEPPER MED (CAST SUPPLIES) ×1 IMPLANT
DURA STEPPER SML (CAST SUPPLIES) IMPLANT
DURA STEPPER XL (SOFTGOODS) IMPLANT
ELECT REM PT RETURN 9FT ADLT (ELECTROSURGICAL) ×2
ELECTRODE REM PT RTRN 9FT ADLT (ELECTROSURGICAL) ×1 IMPLANT
FORMALIN 10 PREFIL 480ML (MISCELLANEOUS) ×1 IMPLANT
GLOVE BIO SURGEON STRL SZ7.5 (GLOVE) ×3 IMPLANT
GLOVE BIOGEL PI IND STRL 7.0 (GLOVE) IMPLANT
GLOVE BIOGEL PI INDICATOR 7.0 (GLOVE) ×2
GLOVE ECLIPSE 6.5 STRL STRAW (GLOVE) ×1 IMPLANT
GLOVE ECLIPSE 7.0 STRL STRAW (GLOVE) ×1 IMPLANT
GLOVE EXAM NITRILE MD LF STRL (GLOVE) ×1 IMPLANT
GOWN STRL REIN XL XLG (GOWN DISPOSABLE) ×6 IMPLANT
K-WIRE 1.4 (WIRE) ×2 IMPLANT
K-WIRE 6 (WIRE)
K-WIRE DUAL TROCAR .062X9 (WIRE) ×4
KIT ROOM TURNOVER AP CYSTO (KITS) ×2 IMPLANT
KIT ROOM TURNOVER APOR (KITS) ×2 IMPLANT
KWIRE 6 (WIRE) IMPLANT
KWIRE DUAL TROCAR .062X9 (WIRE) IMPLANT
MANIFOLD NEPTUNE II (INSTRUMENTS) ×2 IMPLANT
MANIFOLD NEPTUNE WASTE (CANNULA) ×2 IMPLANT
MARKER SKIN DUAL TIP RULER LAB (MISCELLANEOUS) ×2 IMPLANT
NDL HYPO 18GX1.5 BLUNT FILL (NEEDLE) ×1 IMPLANT
NDL HYPO 27GX1-1/4 (NEEDLE) ×4 IMPLANT
NEEDLE HYPO 18GX1.5 BLUNT FILL (NEEDLE) IMPLANT
NEEDLE HYPO 27GX1-1/4 (NEEDLE) ×4 IMPLANT
NS IRRIG 1000ML POUR BTL (IV SOLUTION) ×2 IMPLANT
PACK BASIC LIMB (CUSTOM PROCEDURE TRAY) ×2 IMPLANT
PAD ARMBOARD 7.5X6 YLW CONV (MISCELLANEOUS) ×2 IMPLANT
PIN CAPS ORTHO GREEN .062 (PIN) ×1 IMPLANT
RASP SM TEAR CROSS CUT (RASP) ×2 IMPLANT
SCREW CAN FULL THREAD 4.0X20 (Screw) ×2 IMPLANT
SCREW CANNULATED 4.0X18MM (Screw) ×1 IMPLANT
SCREW PART THREAD 4.0X20MM (Screw) ×1 IMPLANT
SET BASIN LINEN APH (SET/KITS/TRAYS/PACK) ×2 IMPLANT
SPONGE GAUZE 4X4 12PLY (GAUZE/BANDAGES/DRESSINGS) ×2 IMPLANT
SPONGE LAP 18X18 X RAY DECT (DISPOSABLE) ×2 IMPLANT
STRIP CLOSURE SKIN 1/2X4 (GAUZE/BANDAGES/DRESSINGS) ×4 IMPLANT
SUT BONE WAX W31G (SUTURE) ×1 IMPLANT
SUT MON AB 5-0 PS2 18 (SUTURE) ×4 IMPLANT
SUT PROLENE 4 0 PS 2 18 (SUTURE) ×2 IMPLANT
SUT VIC AB 2-0 CT2 27 (SUTURE) ×1 IMPLANT
SUT VIC AB 3-0 SH 27 (SUTURE) ×2
SUT VIC AB 3-0 SH 27X BRD (SUTURE) ×1 IMPLANT
SUT VIC AB 4-0 PS2 27 (SUTURE) ×4 IMPLANT
SUT VICRYL AB 3-0 FS1 BRD 27IN (SUTURE) ×1 IMPLANT
SYR CONTROL 10ML LL (SYRINGE) ×6 IMPLANT
WATER STERILE IRR 1000ML POUR (IV SOLUTION) ×2 IMPLANT
k-wire ×1 IMPLANT

## 2013-07-10 NOTE — H&P (Signed)
HISTORY AND PHYSICAL INTERVAL NOTE:  07/10/2013  7:26 AM  Tammy Mcpherson  has presented today for surgery, with the diagnosis of hallux valgus right foot, hammer toe 2nd digit right foot, plantar flex 3rd metararsal right foot.  The various methods of treatment have been discussed with the patient.  No guarantees were given.  After consideration of risks, benefits and other options for treatment, the patient has consented to surgery.  I have reviewed the patients' chart and labs.    Patient Vitals for the past 24 hrs:  BP Temp Temp src Pulse Resp SpO2  07/10/13 0646 177/107 mmHg 98.2 F (36.8 C) Oral 90 18 97 %    A history and physical examination was performed in my office.  The patient was reexamined.  She has been diagnosed with hypertension.  She has started taking metoprolol.  Dallas Schimke, DPM

## 2013-07-10 NOTE — Anesthesia Postprocedure Evaluation (Signed)
  Anesthesia Post-op Note  Patient: Tammy Mcpherson  Procedure(s) Performed: Procedure(s): VOGLER BUNIONECTOMY RIGHT FOOT (Right) AIKEN OSTEOTOMY RIGHT FOOT (Right) ARTHRODESIS PIPJ  2ND DIGIT RIGHT FOOT (Right) METATARSAL HEAD RESECTION OF DIGITS 2 AND 3 RIGHT FOOT (Right)  Patient Location: PACU  Anesthesia Type:MAC  Level of Consciousness: awake, alert , oriented and patient cooperative  Airway and Oxygen Therapy: Patient Spontanous Breathing  Post-op Pain: 2 /10, mild  Post-op Assessment: Post-op Vital signs reviewed  Post-op Vital Signs: Reviewed and stable  Complications: No apparent anesthesia complications

## 2013-07-10 NOTE — Op Note (Signed)
OPERATIVE NOTE  DATE OF PROCEDURE:  07/10/2013  SURGEON:   Dallas Schimke, DPM  OR STAFF:   Circulator: Cyndie Chime, RN Relief Circulator: Rogene Houston, RN Scrub Person: Diana Eves, CST RN First Assistant: Eliane Decree Page, RN   PREOPERATIVE DIAGNOSIS:   1.  Hallux valgus, right foot. 2.  Hammer toe 2nd digit, right foot. 3.  Plantarflexed 3rd metatarsal, right foot.  POSTOPERATIVE DIAGNOSIS: Same  PROCEDURE: 1.  Vogler bunionectomy, right foot. 2.  Akin osteotomy, right foot. 3.  2nd metatarsal head resection, right foot. 4.  Hammertoe repair 2nd digit, right foot. 5.  3rd metatarsal head resection, right foot.  ANESTHESIA:  Monitor Anesthesia Care   HEMOSTASIS:   Pneumatic ankle tourniquet set at 250 mmHg  ESTIMATED BLOOD LOSS:   Minimal (<5 cc)  MATERIALS USED:  Stryker 4.21mm screw x 2 Styker EasyClip 0.062" k-wire  INJECTABLES: Marcaine 0.5% plain  PATHOLOGY:   1.  Bone 1st metatarsal, right foot. 2.  2nd metatarsal head, right foot. 3.  3rd metatarsal head, right foot.  COMPLICATIONS:  8 None  INDICATIONS:  Painful bunion deformity with overlapping 2nd toe that did not respond to alterations in shoegear.  She also complained of pain plantar to the 3rd metatarsal head.  This did not improve with non-surgical care.  DESCRIPTION OF THE PROCEDURE:   The patient was brought to the operating room and placed on the operative table in the supine position.  A pneumatic ankle tourniquet was applied to the patient's ankle.  Following sedation, the surgical site was anesthetized with 0.5% Marcaine plain.  The foot was then prepped, scrubbed, and draped in the usual sterile technique.  The foot was elevated, exsanguinated and the pneumatic ankle tourniquet inflated to 250 mmHg.    Attention was then directed to the dorsomedial aspect of the first metatarsal phalangeal joint where a 6-cm linear incision was made medial and parallel to the  course of the extensor hallucis longus tendon.  The incision was deepened through subcutaneous tissues.  Vital neurovascular structures were identified and retracted.  All bleeders were identified and cauterized.  The incision was deepened to the level of the first metatarsal phalangeal joint capsule.  An inverted L-type capsulotomy was performed over the dorsal aspect of the first metatarsal phalangeal joint.  The capsular and periosteal structures were dissected free of their osseous attachments and reflected medially and laterally thus exposing the head of the first metatarsal at the operative site.  Utilizing a sagittal saw, the medial prominence was resected and passed from the operative field.  A dorsolateral prominence was identified extending from the lateral aspect of the first metatarsal bone.  It was resected using a bone saw.  It was sent to pathology.  The bone of the first metatarsal was soft.  All rough edges were smoothed.    Attention was directed to the first interspace via the original skin incision.  Using sharp and blunt dissection, the fibular sesamoid was freed of its soft tissue attachments proximally, laterally and distally.  The conjoined tendon of the adductor hallucis muscle was identified and transected at its attachment to the base of the proximal phalanx of the hallux.  The lateral contracture present on the hallux was noted to be reduced and the sesamoid apparatus was noted to float into a more corrected medial position.    Attention was redirected to the medial aspect of the first metatarsal head where a through and through chevron osteotomy with a long  dorsal arm was created in the first metatarsal bone using a sagittal bone saw.  The apex of the osteotomy pointed distally.  Upon completion of the osteotomy the capital fragment was distracted and shifted laterally into a more corrected position.  Two 4.37mm Stryker full threaded screws were inserted across the osteotomy site.   Adequate compression was noted.  The position of the screw was confirmed with fluoroscopy and noted to be in acceptable alignment.    The remaining medial bone shelf was resected utilizing a sagittal bone saw and passed from the operative field.  Attention was then directed to the dorsal aspect of the proximal phalanx of the hallux.  A wedge shaped osteotomy was performed in the proximal phalanx.  The base was oriented medially and the apex laterally.  The wedge of bone was removed and passed from the operative field.  The lateral cortex was feathered to allow for adequate closure of the osteotomy.  A Stryker EasyClip was then inserted across the osteotomy site with adequate compression noted.  Position was confirmed with C-arm fluoroscopy.  The area was copiously irrigated. The periosteal and capsular tissues were approximated with 2-0 Vicryl suture.  4-0 Vicryl was used to approximate the subcutaneous tissues. 4-0 Vicryl was used to approximate the skin in a subcuticular manner.    Attention was directed to the dorsal aspect of the second digit.  A linear longitudinal incision was made overlying the second digit and second metatarsophalangeal joint.  Dissection was continued deep down to the level of the extensor digitorum longus tendon.  A transverse tenotomy and capsulotomy was performed at the metatarsophalangeal joint.  The extensor tendon was reflected proximally thus exposing the second metatarsophalangeal joint at the operative site.  A transverse capsulotomy was performed at the level of proximal interphalangeal joint.  Using a power bone saw an osteotomy was performed proximal to the surgical neck of the second metatarsal bone.  The head of the second metatarsal was freed of all soft tissue attachments and removed.  The bone was passed from the operative field.  It was sent to pathology for evaluation.  The contracture on the hallux was noted to be reduced at the metatarsophalangeal joint.  However  contracture at the proximal interphalangeal joint was found persist.  The articular surface from the head of the proximal phalanx and base of the middle phalanx were removed and passed from the operative field.  Due to the soft nature of the bone, I was concerned that the Stryker toe implant would not provide the best form of fixation.  A K wire was used instead.  It was inserted through the middle phalanx exiting the distal aspect of the second toe and retrograded across the proximal phalanx and into the remaining portion of the second metatarsal bone.  Alignment of the second toe was noted to be improved from the preoperative presentation.  The capsule of the second metatarsophalangeal joint was reapproximated using 4-0 Vicryl.  The extensor tendon was reapproximated using 4-0 Vicryl.  The subcutaneous structures reapproximated using 4-0 Vicryl and the skin was reapproximated using 4-0 Vicryl in a running subcuticular manner.  The incision was reinforced with 4-0 Prolene.  Attention was directed to the dorsal aspect of the third metatarsophalangeal joint.  A linear longitudinal incision was performed.  Dissection was continued deep to the level of the third metatarsophalangeal joint.  A linear capsulotomy was performed.  The capsule was reflected medially and laterally thus exposing the head of the third metatarsal to  operative site.  Using a power bone saw an osteotomy was performed proximal to the surgical neck of the third metatarsal.  The head of the third metatarsal was freed of all soft tissue attachments.  It was removed and sent to pathology for evaluation.  The wound was irrigated with copious amounts of sterile irrigant.  The capsulotomy was reapproximated using 4-0 Vicryl.  The subcutaneous structures reapproximated using 4-0 Vicryl.  The skin was reapproximated using 4-0 Vicryl and 4-0 Prolene.  A sterile compressive dressing was applied to the right foot.  The pneumatic ankle tourniquet was  deflated and a prompt hyperemic response was noted to all digits of the right foot.   The patient tolerated the procedure well.  The patient was then transferred to PACU with vital signs stable and vascular status intact to all toes of the operative foot.  Following a period of postoperative monitoring, the patient will be discharged home.

## 2013-07-10 NOTE — Progress Notes (Signed)
Cam walker on. darco post op shoe given for future use. Ice pack in place. Pt states she has walker and crutches and knows how to ambulate with both. Putting weight on surgical foot even when frequently reminded no weight bearing.

## 2013-07-10 NOTE — Anesthesia Preprocedure Evaluation (Signed)
Anesthesia Evaluation  Patient identified by MRN, date of birth, ID band Patient awake    Reviewed: Allergy & Precautions, H&P , NPO status , Patient's Chart, lab work & pertinent test results, reviewed documented beta blocker date and time   Airway Mallampati: II TM Distance: >3 FB     Dental  (+) Edentulous Upper and Edentulous Lower   Pulmonary Current Smoker (am cough),  breath sounds clear to auscultation        Cardiovascular hypertension, Pt. on medications and Pt. on home beta blockers Rhythm:Regular Rate:Normal     Neuro/Psych    GI/Hepatic negative GI ROS,   Endo/Other    Renal/GU      Musculoskeletal   Abdominal   Peds  Hematology   Anesthesia Other Findings   Reproductive/Obstetrics                           Anesthesia Physical Anesthesia Plan  ASA: II  Anesthesia Plan: MAC   Post-op Pain Management:    Induction: Intravenous  Airway Management Planned: Nasal Cannula  Additional Equipment:   Intra-op Plan:   Post-operative Plan:   Informed Consent: I have reviewed the patients History and Physical, chart, labs and discussed the procedure including the risks, benefits and alternatives for the proposed anesthesia with the patient or authorized representative who has indicated his/her understanding and acceptance.     Plan Discussed with:   Anesthesia Plan Comments:         Anesthesia Quick Evaluation

## 2013-07-10 NOTE — Brief Op Note (Signed)
BRIEF OPERATIVE NOTE  SURGEON:   Dallas Schimke, DPM  OR STAFF:   Circulator: Cyndie Chime, RN Relief Circulator: Rogene Houston, RN Scrub Person: Diana Eves, CST RN First Assistant: Eliane Decree Page, RN   PREOPERATIVE DIAGNOSIS:   1.  Hallux valgus, right foot. 2.  Hammer toe 2nd digit, right foot. 3.  Plantarflexed 3rd metararsal, right foot.  POSTOPERATIVE DIAGNOSIS: Same  PROCEDURE: 1.  Vogler bunionectomy, right foot. 2.  Akin osteotomy, right foot. 3.  2nd metatarsal head resection, right foot. 4.  Hammertoe repair 2nd digit, right foot. 5.  3rd metatarsal head resection, right foot.  ANESTHESIA:  Monitor Anesthesia Care   HEMOSTASIS:   Pneumatic ankle tourniquet set at 250 mmHg  ESTIMATED BLOOD LOSS:   Minimal (<5 cc)  MATERIALS USED:  Stryker 4.3mm screw x 2 0.062" k-wire  INJECTABLES: Marcaine 0.5% plain  PATHOLOGY:   1.  Bone 1st metatarsal, right foot. 2.  2nd metatarsal head, right foot. 3.  3rd metatarsal head, right foot.  COMPLICATIONS:   None  INDICATIONS:  Painful bunion deformity with overlapping 2nd toe that did not respond to alterations in shoegear.  She also complained of pain plantar to the 3rd metatarsal head.  This did not improve with non-surgical care.  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

## 2013-07-10 NOTE — Transfer of Care (Signed)
Immediate Anesthesia Transfer of Care Note  Patient: Tammy Mcpherson  Procedure(s) Performed: Procedure(s): VOGLER BUNIONECTOMY RIGHT FOOT (Right) AIKEN OSTEOTOMY RIGHT FOOT (Right) ARTHRODESIS PIPJ  2ND DIGIT RIGHT FOOT (Right) METATARSAL HEAD RESECTION OF DIGITS 2 AND 3 RIGHT FOOT (Right)  Patient Location: PACU  Anesthesia Type:MAC  Level of Consciousness: sedated and patient cooperative  Airway & Oxygen Therapy: Patient Spontanous Breathing and Patient connected to face mask oxygen  Post-op Assessment: Report given to PACU RN, Post -op Vital signs reviewed and stable and Patient moving all extremities  Post vital signs: Reviewed and stable  Complications: No apparent anesthesia complications

## 2013-07-15 ENCOUNTER — Encounter (HOSPITAL_COMMUNITY): Payer: Self-pay | Admitting: Podiatry

## 2013-07-18 ENCOUNTER — Ambulatory Visit: Payer: 59

## 2013-08-15 ENCOUNTER — Emergency Department (HOSPITAL_COMMUNITY): Payer: 59

## 2013-08-15 ENCOUNTER — Encounter (HOSPITAL_COMMUNITY): Payer: Self-pay | Admitting: Emergency Medicine

## 2013-08-15 ENCOUNTER — Emergency Department (HOSPITAL_COMMUNITY)
Admission: EM | Admit: 2013-08-15 | Discharge: 2013-08-15 | Disposition: A | Discharge status: Home / Self Care | Payer: 59 | Attending: Emergency Medicine | Admitting: Emergency Medicine

## 2013-08-15 DIAGNOSIS — R0789 Other chest pain: Secondary | ICD-10-CM

## 2013-08-15 DIAGNOSIS — Z79899 Other long term (current) drug therapy: Secondary | ICD-10-CM | POA: Insufficient documentation

## 2013-08-15 DIAGNOSIS — I10 Essential (primary) hypertension: Secondary | ICD-10-CM | POA: Insufficient documentation

## 2013-08-15 DIAGNOSIS — Z880 Allergy status to penicillin: Secondary | ICD-10-CM | POA: Insufficient documentation

## 2013-08-15 DIAGNOSIS — F17200 Nicotine dependence, unspecified, uncomplicated: Secondary | ICD-10-CM | POA: Insufficient documentation

## 2013-08-15 DIAGNOSIS — R071 Chest pain on breathing: Secondary | ICD-10-CM | POA: Insufficient documentation

## 2013-08-15 DIAGNOSIS — R000 Tachycardia, unspecified: Secondary | ICD-10-CM | POA: Insufficient documentation

## 2013-08-15 LAB — CBC WITH DIFFERENTIAL/PLATELET
Basophils Absolute: 0 10*3/uL (ref 0.0–0.1)
Basophils Relative: 0 % (ref 0–1)
Eosinophils Absolute: 0.1 10*3/uL (ref 0.0–0.7)
HCT: 46.3 % — ABNORMAL HIGH (ref 36.0–46.0)
Lymphocytes Relative: 13 % (ref 12–46)
MCH: 30.7 pg (ref 26.0–34.0)
MCHC: 34.6 g/dL (ref 30.0–36.0)
Monocytes Absolute: 1.9 10*3/uL — ABNORMAL HIGH (ref 0.1–1.0)
Neutro Abs: 12.2 10*3/uL — ABNORMAL HIGH (ref 1.7–7.7)
Neutrophils Relative %: 75 % (ref 43–77)
Platelets: 436 10*3/uL — ABNORMAL HIGH (ref 150–400)
RDW: 14.2 % (ref 11.5–15.5)
WBC: 16.3 10*3/uL — ABNORMAL HIGH (ref 4.0–10.5)

## 2013-08-15 LAB — BASIC METABOLIC PANEL
CO2: 23 mEq/L (ref 19–32)
Chloride: 99 mEq/L (ref 96–112)
Creatinine, Ser: 0.71 mg/dL (ref 0.50–1.10)
GFR calc Af Amer: >90 mL/min (ref >90)
Sodium: 137 mEq/L (ref 135–145)

## 2013-08-15 LAB — TROPONIN I: Troponin I: <0.3 ng/mL (ref <0.30)

## 2013-08-15 LAB — D-DIMER, QUANTITATIVE: D-Dimer, Quant: <0.27 ug/mL-FEU (ref 0.00–0.48)

## 2013-08-15 MED ORDER — PROMETHAZINE HCL 25 MG PO TABS: 25.0000 mg | ORAL_TABLET | Freq: Four times a day (QID) | ORAL | Status: DC | PRN

## 2013-08-15 MED ORDER — MORPHINE SULFATE 4 MG/ML IJ SOLN
4.0000 mg | Freq: Once | INTRAMUSCULAR | Status: AC
  Administered 2013-08-15: 4 mg via INTRAVENOUS
  Filled 2013-08-15: qty 1

## 2013-08-15 MED ORDER — IOHEXOL 300 MG/ML  SOLN
80.0000 mL | Freq: Once | INTRAMUSCULAR | Status: AC | PRN
  Administered 2013-08-15: 80 mL via INTRAVENOUS

## 2013-08-15 MED ORDER — OXYCODONE-ACETAMINOPHEN 5-325 MG PO TABS: 2.0000 | ORAL_TABLET | ORAL | Status: DC | PRN

## 2013-08-15 MED ORDER — ONDANSETRON HCL 4 MG/2ML IJ SOLN
4.0000 mg | Freq: Once | INTRAMUSCULAR | Status: AC
  Administered 2013-08-15: 4 mg via INTRAVENOUS
  Filled 2013-08-15: qty 2

## 2013-08-15 NOTE — ED Provider Notes (Signed)
CSN: [id number]     Arrival date & time 08/15/13  1249 History  This chart was scribed for Donnetta Hutching, MD by Danella Maiers, ED Scribe. This patient was seen in room APA02/APA02 and the patient's care was started at 3:30 PM.   Chief Complaint  Patient presents with  . Chest Pain   The history is provided by the patient. No language interpreter was used.   HPI Comments: Tammy Mcpherson is a 57 y.o. female who presents to the Emergency Department complaining of aching CP in the lower sternum that started when she woke this morning at 10:30am with mild SOB and tachycardia. She did not take her beta blocker today. She reports increased pain with palpation. She checked her BP and it was elevated. She states she recently switched to a new BP medication, HCTZ 25mg  once daily and metoprolol 50mg  2x daily. She denies diaphoresis, emesis. She denies recent travel. She smokes one-third ppd E-cigarettes. No family history of early MI  PCP - Dr Lenise Herald  Past Medical History  Diagnosis Date  . Hypertension     diet control   Past Surgical History  Procedure Laterality Date  . Abdominal hysterectomy    . Bunionectomy Right 07/10/2013    Procedure: VOGLER BUNIONECTOMY RIGHT FOOT;  Surgeon: Dallas Schimke, DPM;  Location: [facility];  Service: Orthopedics;  Laterality: Right;  . Aiken osteotomy Right 07/10/2013    Procedure: Quintella Reichert OSTEOTOMY RIGHT FOOT;  Surgeon: Dallas Schimke, DPM;  Location: [facility];  Service: Orthopedics;  Laterality: Right;  . Proximal interphalangeal fusion (pip) Right 07/10/2013    Procedure: ARTHRODESIS PIPJ  2ND DIGIT RIGHT FOOT;  Surgeon: Dallas Schimke, DPM;  Location: [facility];  Service: Orthopedics;  Laterality: Right;  . Metatarsal head excision Right 07/10/2013    Procedure: METATARSAL HEAD RESECTION OF DIGITS 2 AND 3 RIGHT FOOT;  Surgeon: Dallas Schimke, DPM;  Location: [facility];  Service: Orthopedics;  Laterality: Right;   History reviewed. No  pertinent family history. History  Substance Use Topics  . Smoking status: Current Every Day Smoker -- 0.25 packs/day for 37 years    Types: Cigarettes  . Smokeless tobacco: Not on file  . Alcohol Use: No   OB History   Grav Para Term Preterm Abortions TAB SAB Ect Mult Living                 Review of Systems  Constitutional: Negative for diaphoresis.  Respiratory: Positive for shortness of breath.   Cardiovascular: Positive for chest pain.  Gastrointestinal: Negative for vomiting.   A complete 10 system review of systems was obtained and all systems are negative except as noted in the HPI and PMH.   Allergies  Penicillins and Sudafed  Home Medications   Current Outpatient Rx  Name  Route  Sig  Dispense  Refill  . METOPROLOL TARTRATE PO   Oral   Take 25 mg by mouth 2 (two) times daily.         . naproxen sodium (ANAPROX) 220 MG tablet   Oral   Take 220 mg by mouth 2 (two) times daily as needed (for foot pain.).          BP 145/97  Pulse 120  Temp(Src) 98.5 F (36.9 C) (Oral)  Resp 18  Ht 5\' 5"  (1.651 m)  Wt 132 lb (59.875 kg)  BMI 21.97 kg/m2  SpO2 100% Physical Exam  Nursing note and vitals reviewed. Constitutional: She is oriented to  person, place, and time. She appears well-developed and well-nourished.  HENT:  Head: Normocephalic and atraumatic.  Eyes: Conjunctivae and EOM are normal. Pupils are equal, round, and reactive to light.  Neck: Normal range of motion. Neck supple.  Cardiovascular: Regular rhythm and normal heart sounds.  Tachycardia present.   Pulmonary/Chest: Effort normal and breath sounds normal.  TTP on sternum  Abdominal: Soft. Bowel sounds are normal.  Musculoskeletal: Normal range of motion.  Neurological: She is alert and oriented to person, place, and time.  Skin: Skin is warm and dry.  Psychiatric: She has a normal mood and affect.    ED Course  Procedures (including critical care time) Medications - No data to  display  DIAGNOSTIC STUDIES: Oxygen Saturation is 100% on RA, normal by my interpretation.    COORDINATION OF CARE: 3:54 PM- Discussed treatment plan with pt which includes CXR, EKG, blood work. Pt agrees to plan.    Labs Review Labs Reviewed  CBC WITH DIFFERENTIAL - Abnormal; Notable for the following:    WBC 16.3 (*)    RBC 5.22 (*)    Hemoglobin 16.0 (*)    HCT 46.3 (*)    Platelets 436 (*)    Neutro Abs 12.2 (*)    Monocytes Absolute 1.9 (*)    All other components within normal limits  BASIC METABOLIC PANEL - Abnormal; Notable for the following:    Potassium 3.3 (*)    Glucose, Bld 124 (*)    Calcium 10.6 (*)    All other components within normal limits  TROPONIN I   Imaging Review Dg Chest 2 View  08/15/2013   CLINICAL DATA:  Chest pain  EXAM: CHEST  2 VIEW  COMPARISON:  11/03/2012  FINDINGS: Cardiac shadow is within normal limits. The lungs are well-aerated without focal infiltrate. Mild hyperinflation is again seen. There is a vague nodular density best seen on the lateral projection in the retrosternal clear space. Further evaluation is recommended. No acute bony abnormality is seen.  IMPRESSION: Vague nodular density on the lateral projection. This was not well seen on the prior exam and CT of the chest without contrast is recommended for further evaluation.   Electronically Signed   By: Alcide Clever M.D.   On: 08/15/2013 13:47   Ct Chest W Contrast  08/15/2013   CLINICAL DATA:  Chest pain.  Lung nodule.  EXAM: CT CHEST WITH CONTRAST  TECHNIQUE: Multidetector CT imaging of the chest was performed during intravenous contrast administration.  CONTRAST:  80mL OMNIPAQUE IOHEXOL 300 MG/ML  SOLN  COMPARISON:  Chest radiograph 08/15/2013.  FINDINGS: Mediastinal lymph nodes are not enlarged by CT size criteria. No hilar or axillary adenopathy. Atherosclerotic calcification of the arterial vasculature, including at least 2 vessel involvement of the coronary arteries. Heart size  normal. No pericardial effusion. Possible mild thickening of the distal esophageal wall, which can be seen with gastroesophageal reflux disease.  Mild biapical pleural parenchymal scarring. Minimal dependent atelectasis bilaterally. No pleural fluid. Airway is unremarkable.  Incidental imaging of the upper abdomen shows no acute findings. No worrisome lytic or sclerotic lesions.  IMPRESSION: 1. No findings to account for the questioned abnormality on recent chest radiograph. 2. At least 2 vessel coronary artery calcification.   Electronically Signed   By: Leanna Battles M.D.   On: 08/15/2013 18:31    EKG Interpretation     Ventricular Rate:  106 PR Interval:  142 QRS Duration: 86 QT Interval:  368 QTC Calculation: 488 R Axis:  74 Text Interpretation:  Sinus tachycardia ST \\T \ T wave abnormality, consider inferior ischemia Abnormal ECG When compared with ECG of 03-Nov-2012 16:24, T wave inversion now evident in Inferior leads Nonspecific T wave abnormality now evident in Lateral leads            MDM  No diagnosis found. Patient is exquisitely tender in her anterior chest wall. She is slightly tachycardic, however she did not take her beta blocker today. D-dimer and troponin were negative. No obvious source of infection.     I personally performed the services described in this documentation, which was scribed in my presence. The recorded information has been reviewed and is accurate.    Donnetta Hutching, MD 08/15/13 [phone]

## 2013-08-15 NOTE — ED Notes (Addendum)
On awakening today, felt her heart was beating fast, checked her bp and it was elevated.  Says she  Is on new med for bp.  Has post op shoe on lt foot.

## 2016-04-30 ENCOUNTER — Emergency Department (HOSPITAL_COMMUNITY): Payer: PRIVATE HEALTH INSURANCE

## 2016-04-30 ENCOUNTER — Encounter (HOSPITAL_COMMUNITY): Payer: Self-pay | Admitting: Emergency Medicine

## 2016-04-30 ENCOUNTER — Inpatient Hospital Stay (HOSPITAL_COMMUNITY)
Admission: EM | Admit: 2016-04-30 | Discharge: 2016-05-03 | DRG: 291 | Disposition: A | Discharge status: Home / Self Care | Payer: PRIVATE HEALTH INSURANCE | Attending: Family Medicine | Admitting: Family Medicine

## 2016-04-30 DIAGNOSIS — D509 Iron deficiency anemia, unspecified: Secondary | ICD-10-CM | POA: Diagnosis present

## 2016-04-30 DIAGNOSIS — J441 Chronic obstructive pulmonary disease with (acute) exacerbation: Secondary | ICD-10-CM | POA: Diagnosis present

## 2016-04-30 DIAGNOSIS — I110 Hypertensive heart disease with heart failure: Principal | ICD-10-CM | POA: Diagnosis present

## 2016-04-30 DIAGNOSIS — Z79891 Long term (current) use of opiate analgesic: Secondary | ICD-10-CM | POA: Diagnosis not present

## 2016-04-30 DIAGNOSIS — I429 Cardiomyopathy, unspecified: Secondary | ICD-10-CM | POA: Diagnosis present

## 2016-04-30 DIAGNOSIS — Z8249 Family history of ischemic heart disease and other diseases of the circulatory system: Secondary | ICD-10-CM

## 2016-04-30 DIAGNOSIS — D649 Anemia, unspecified: Secondary | ICD-10-CM

## 2016-04-30 DIAGNOSIS — I428 Other cardiomyopathies: Secondary | ICD-10-CM

## 2016-04-30 DIAGNOSIS — I2510 Atherosclerotic heart disease of native coronary artery without angina pectoris: Secondary | ICD-10-CM | POA: Diagnosis present

## 2016-04-30 DIAGNOSIS — I5021 Acute systolic (congestive) heart failure: Secondary | ICD-10-CM | POA: Diagnosis not present

## 2016-04-30 DIAGNOSIS — I10 Essential (primary) hypertension: Secondary | ICD-10-CM

## 2016-04-30 DIAGNOSIS — I5041 Acute combined systolic (congestive) and diastolic (congestive) heart failure: Secondary | ICD-10-CM | POA: Diagnosis present

## 2016-04-30 DIAGNOSIS — R0609 Other forms of dyspnea: Secondary | ICD-10-CM | POA: Diagnosis present

## 2016-04-30 DIAGNOSIS — I25709 Atherosclerosis of coronary artery bypass graft(s), unspecified, with unspecified angina pectoris: Secondary | ICD-10-CM | POA: Diagnosis not present

## 2016-04-30 DIAGNOSIS — I509 Heart failure, unspecified: Secondary | ICD-10-CM

## 2016-04-30 DIAGNOSIS — J9601 Acute respiratory failure with hypoxia: Secondary | ICD-10-CM | POA: Diagnosis present

## 2016-04-30 DIAGNOSIS — F17200 Nicotine dependence, unspecified, uncomplicated: Secondary | ICD-10-CM | POA: Diagnosis not present

## 2016-04-30 DIAGNOSIS — I248 Other forms of acute ischemic heart disease: Secondary | ICD-10-CM | POA: Diagnosis present

## 2016-04-30 DIAGNOSIS — R778 Other specified abnormalities of plasma proteins: Secondary | ICD-10-CM | POA: Diagnosis present

## 2016-04-30 DIAGNOSIS — T380X5A Adverse effect of glucocorticoids and synthetic analogues, initial encounter: Secondary | ICD-10-CM | POA: Diagnosis present

## 2016-04-30 DIAGNOSIS — Z720 Tobacco use: Secondary | ICD-10-CM | POA: Diagnosis present

## 2016-04-30 DIAGNOSIS — F17210 Nicotine dependence, cigarettes, uncomplicated: Secondary | ICD-10-CM | POA: Diagnosis present

## 2016-04-30 DIAGNOSIS — I5043 Acute on chronic combined systolic (congestive) and diastolic (congestive) heart failure: Secondary | ICD-10-CM | POA: Diagnosis present

## 2016-04-30 DIAGNOSIS — D72829 Elevated white blood cell count, unspecified: Secondary | ICD-10-CM | POA: Diagnosis present

## 2016-04-30 DIAGNOSIS — J449 Chronic obstructive pulmonary disease, unspecified: Secondary | ICD-10-CM | POA: Diagnosis present

## 2016-04-30 DIAGNOSIS — R7989 Other specified abnormal findings of blood chemistry: Secondary | ICD-10-CM | POA: Diagnosis present

## 2016-04-30 HISTORY — DX: Anemia, unspecified: D64.9

## 2016-04-30 HISTORY — DX: Essential (primary) hypertension: I10

## 2016-04-30 HISTORY — DX: Cardiomyopathy, unspecified: I42.9

## 2016-04-30 HISTORY — DX: Personal history of other diseases of the respiratory system: Z87.09

## 2016-04-30 LAB — RETICULOCYTES
RBC.: 3.72 MIL/uL — ABNORMAL LOW (ref 3.87–5.11)
Retic Count, Absolute: 89.3 10*3/uL (ref 19.0–186.0)
Retic Ct Pct: 2.4 % (ref 0.4–3.1)

## 2016-04-30 LAB — CBC WITH DIFFERENTIAL/PLATELET
Basophils Absolute: 0 10*3/uL (ref 0.0–0.1)
Basophils Relative: 0 %
EOS PCT: 1 %
Eosinophils Absolute: 0.1 10*3/uL (ref 0.0–0.7)
HEMATOCRIT: 29.3 % — AB (ref 36.0–46.0)
HEMOGLOBIN: 8.9 g/dL — AB (ref 12.0–15.0)
LYMPHS ABS: 1.9 10*3/uL (ref 0.7–4.0)
LYMPHS PCT: 14 %
MCH: 22.6 pg — ABNORMAL LOW (ref 26.0–34.0)
MCHC: 30.4 g/dL (ref 30.0–36.0)
MCV: 74.6 fL — AB (ref 78.0–100.0)
MONOS PCT: 12 %
Monocytes Absolute: 1.6 10*3/uL — ABNORMAL HIGH (ref 0.1–1.0)
NEUTROS ABS: 9.8 10*3/uL — AB (ref 1.7–7.7)
Neutrophils Relative %: 73 %
Platelets: 496 10*3/uL — ABNORMAL HIGH (ref 150–400)
RBC: 3.93 MIL/uL (ref 3.87–5.11)
RDW: 17.8 % — AB (ref 11.5–15.5)
WBC: 13.4 10*3/uL — ABNORMAL HIGH (ref 4.0–10.5)

## 2016-04-30 LAB — BASIC METABOLIC PANEL
ANION GAP: 8 (ref 5–15)
BUN: 11 mg/dL (ref 6–20)
CO2: 23 mmol/L (ref 22–32)
Calcium: 8.5 mg/dL — ABNORMAL LOW (ref 8.9–10.3)
Chloride: 110 mmol/L (ref 101–111)
Creatinine, Ser: 0.67 mg/dL (ref 0.44–1.00)
GFR calc non Af Amer: >60 mL/min (ref >60)
GLUCOSE: 135 mg/dL — AB (ref 65–99)
Potassium: 3.7 mmol/L (ref 3.5–5.1)
SODIUM: 141 mmol/L (ref 135–145)

## 2016-04-30 LAB — FOLATE: FOLATE: 22.3 ng/mL (ref >5.9)

## 2016-04-30 LAB — TSH: TSH: 1.727 u[IU]/mL (ref 0.350–4.500)

## 2016-04-30 LAB — BRAIN NATRIURETIC PEPTIDE: B Natriuretic Peptide: 446 pg/mL — ABNORMAL HIGH (ref 0.0–100.0)

## 2016-04-30 LAB — TROPONIN I
Troponin I: 0.03 ng/mL (ref <0.03)
Troponin I: 0.05 ng/mL (ref <0.03)

## 2016-04-30 MED ORDER — FUROSEMIDE 10 MG/ML IJ SOLN
40.0000 mg | Freq: Two times a day (BID) | INTRAMUSCULAR | Status: DC
  Administered 2016-04-30 – 2016-05-02 (×4): 40 mg via INTRAVENOUS
  Filled 2016-04-30 (×4): qty 4

## 2016-04-30 MED ORDER — METOPROLOL TARTRATE 25 MG PO TABS
25.0000 mg | ORAL_TABLET | Freq: Two times a day (BID) | ORAL | Status: DC
  Administered 2016-04-30 – 2016-05-02 (×4): 25 mg via ORAL
  Filled 2016-04-30 (×4): qty 1

## 2016-04-30 MED ORDER — IPRATROPIUM-ALBUTEROL 0.5-2.5 (3) MG/3ML IN SOLN
3.0000 mL | Freq: Four times a day (QID) | RESPIRATORY_TRACT | Status: DC
  Administered 2016-04-30 – 2016-05-01 (×5): 3 mL via RESPIRATORY_TRACT
  Filled 2016-04-30 (×5): qty 3

## 2016-04-30 MED ORDER — METHYLPREDNISOLONE SODIUM SUCC 125 MG IJ SOLR
60.0000 mg | Freq: Two times a day (BID) | INTRAMUSCULAR | Status: DC
Start: 2016-04-30 — End: 2016-05-02
  Administered 2016-04-30 – 2016-05-02 (×4): 60 mg via INTRAVENOUS
  Filled 2016-04-30 (×4): qty 2

## 2016-04-30 MED ORDER — ACETAMINOPHEN 325 MG PO TABS
650.0000 mg | ORAL_TABLET | ORAL | Status: DC | PRN
  Administered 2016-04-30 – 2016-05-01 (×2): 650 mg via ORAL
  Filled 2016-04-30 (×2): qty 2

## 2016-04-30 MED ORDER — AZITHROMYCIN 250 MG PO TABS
500.0000 mg | ORAL_TABLET | Freq: Every day | ORAL | Status: AC
  Administered 2016-04-30: 500 mg via ORAL
  Filled 2016-04-30: qty 2

## 2016-04-30 MED ORDER — ALBUTEROL (5 MG/ML) CONTINUOUS INHALATION SOLN
10.0000 mg/h | INHALATION_SOLUTION | Freq: Once | RESPIRATORY_TRACT | Status: AC
  Administered 2016-04-30: 10 mg/h via RESPIRATORY_TRACT
  Filled 2016-04-30: qty 20

## 2016-04-30 MED ORDER — GUAIFENESIN ER 600 MG PO TB12
1200.0000 mg | ORAL_TABLET | Freq: Two times a day (BID) | ORAL | Status: DC
  Administered 2016-04-30 – 2016-05-03 (×6): 1200 mg via ORAL
  Filled 2016-04-30 (×6): qty 2

## 2016-04-30 MED ORDER — LISINOPRIL 5 MG PO TABS
5.0000 mg | ORAL_TABLET | Freq: Every day | ORAL | Status: DC
  Administered 2016-04-30 – 2016-05-02 (×3): 5 mg via ORAL
  Filled 2016-04-30 (×3): qty 1

## 2016-04-30 MED ORDER — AZITHROMYCIN 250 MG PO TABS
250.0000 mg | ORAL_TABLET | Freq: Every day | ORAL | Status: DC
  Administered 2016-05-01 – 2016-05-03 (×3): 250 mg via ORAL
  Filled 2016-04-30 (×3): qty 1

## 2016-04-30 MED ORDER — ONDANSETRON HCL 4 MG/2ML IJ SOLN
4.0000 mg | Freq: Four times a day (QID) | INTRAMUSCULAR | Status: DC | PRN
  Administered 2016-05-02: 4 mg via INTRAVENOUS
  Filled 2016-04-30: qty 2

## 2016-04-30 MED ORDER — IOPAMIDOL (ISOVUE-370) INJECTION 76%
100.0000 mL | Freq: Once | INTRAVENOUS | Status: AC | PRN
  Administered 2016-04-30: 100 mL via INTRAVENOUS

## 2016-04-30 MED ORDER — ALBUTEROL SULFATE (2.5 MG/3ML) 0.083% IN NEBU: 2.5000 mg | INHALATION_SOLUTION | RESPIRATORY_TRACT | Status: DC | PRN

## 2016-04-30 MED ORDER — SODIUM CHLORIDE 0.9% FLUSH
3.0000 mL | Freq: Two times a day (BID) | INTRAVENOUS | Status: DC
  Administered 2016-04-30 – 2016-05-03 (×6): 3 mL via INTRAVENOUS

## 2016-04-30 MED ORDER — SODIUM CHLORIDE 0.9 % IV SOLN
250.0000 mL | INTRAVENOUS | Status: DC | PRN
Start: 2016-04-30 — End: 2016-05-03

## 2016-04-30 MED ORDER — SODIUM CHLORIDE 0.9% FLUSH: 3.0000 mL | INTRAVENOUS | Status: DC | PRN

## 2016-04-30 MED ORDER — CETYLPYRIDINIUM CHLORIDE 0.05 % MT LIQD
7.0000 mL | Freq: Two times a day (BID) | OROMUCOSAL | Status: DC
  Administered 2016-04-30 – 2016-05-03 (×6): 7 mL via OROMUCOSAL

## 2016-04-30 MED ORDER — ASPIRIN EC 81 MG PO TBEC
81.0000 mg | DELAYED_RELEASE_TABLET | Freq: Every day | ORAL | Status: DC
  Administered 2016-04-30 – 2016-05-03 (×4): 81 mg via ORAL
  Filled 2016-04-30 (×4): qty 1

## 2016-04-30 MED ORDER — FUROSEMIDE 10 MG/ML IJ SOLN
40.0000 mg | INTRAMUSCULAR | Status: AC
  Administered 2016-04-30: 40 mg via INTRAVENOUS
  Filled 2016-04-30: qty 4

## 2016-04-30 MED ORDER — ENOXAPARIN SODIUM 40 MG/0.4ML ~~LOC~~ SOLN
40.0000 mg | SUBCUTANEOUS | Status: DC
  Administered 2016-04-30 – 2016-05-02 (×3): 40 mg via SUBCUTANEOUS
  Filled 2016-04-30 (×4): qty 0.4

## 2016-04-30 MED ORDER — ONDANSETRON HCL 4 MG/2ML IJ SOLN: 4.0000 mg | Freq: Three times a day (TID) | INTRAMUSCULAR | Status: DC | PRN

## 2016-04-30 NOTE — Progress Notes (Signed)
Patient arrived on unit to room 330.  Telemetry box 7 applied and verified.  O2 3L [place] in place.  Patient alert and oriented and stable.  Dr. Roderic Palau notified via text page.

## 2016-04-30 NOTE — ED Notes (Addendum)
Continuous neb completed. Minimal improvement noted. Pt reports chest pain 8/10 at this time. Pt reports increase pain with movement, increase in dyspnea with exertion. Lung fields remain clear in all fields. Chest expansion symmetrical. nad noted.

## 2016-04-30 NOTE — Progress Notes (Signed)
CRITICAL VALUE ALERT  Critical value received:  Troponin 0.05  Date of notification:  04/30/2016  Time of notification:  [id number]  Critical value read back:Yes.    Nurse who received alert:  Gershon Cull, RN  MD notified (1st page):  Dr. Roderic Palau  Time of first page:  1836  MD notified (2nd page):    Time of second page:  Responding MD:  Dr. Roderic Palau  Time MD responded:  1836  Dr. Roderic Palau returned call and gave order to continue to monitor.

## 2016-04-30 NOTE — ED Triage Notes (Addendum)
Pt reports shortness of breath x1 week. EDP at bedside.  Pt reports chest pain and tightness. Pt reports intermittent coughing. Moderate swelling noted to LLE.

## 2016-04-30 NOTE — ED Notes (Signed)
RT aware of breathing treatment

## 2016-04-30 NOTE — ED Notes (Signed)
Pt brought back from xray by Kim Marley. 

## 2016-04-30 NOTE — ED Provider Notes (Signed)
[facility] DEPT Provider Note   CSN: [id number] Arrival date & time: 04/30/16  [id number]  First Provider Contact:  First MD Initiated Contact with Patient 04/30/16 [phone]        History   Chief Complaint Chief Complaint  Patient presents with  . Shortness of Breath    HPI Tammy Mcpherson is a 60 y.o. female.  The patient is a 60 year old female, she presents to the hospital with a complaint of shortness of breath which started in the last couple of weeks, is gradually worsening and is associated with coughing, swelling of her left leg and a heaviness on her chest which is right in the middle of the chest. She denies any prior cardiac history, she denies emphysema but does state that she has been diagnosed with bronchitis in the past. She does not use home oxygen, she does not use albuterol treatments and she has not been seen by a physician for this complaint prior to this evaluation.   The history is provided by the patient and the spouse.  Shortness of Breath  This is a new problem. The average episode lasts 2 weeks. The problem occurs frequently.The current episode started more than 1 week ago. The problem has been gradually worsening. Pertinent negatives include no fever. She has tried nothing for the symptoms. She has had no prior hospitalizations. She has had no prior ED visits. She has had no prior ICU admissions. Associated medical issues do not include asthma or pneumonia.    Past Medical History:  Diagnosis Date  . Bronchitis   . Hypertension    diet control    Patient Active Problem List   Diagnosis Date Noted  . CHF (congestive heart failure) ([place]) 04/30/2016    Past Surgical History:  Procedure Laterality Date  . ABDOMINAL HYSTERECTOMY    . Barbie Banner OSTEOTOMY Right 07/10/2013   Procedure: Barbie Banner OSTEOTOMY RIGHT FOOT;  Surgeon: Marcheta Grammes, DPM;  Location: [facility];  Service: Orthopedics;  Laterality: Right;  . BUNIONECTOMY Right 07/10/2013   Procedure: VOGLER  BUNIONECTOMY RIGHT FOOT;  Surgeon: Marcheta Grammes, DPM;  Location: [facility];  Service: Orthopedics;  Laterality: Right;  . METATARSAL HEAD EXCISION Right 07/10/2013   Procedure: METATARSAL HEAD RESECTION OF DIGITS 2 AND 3 RIGHT FOOT;  Surgeon: Marcheta Grammes, DPM;  Location: [facility];  Service: Orthopedics;  Laterality: Right;  . PROXIMAL INTERPHALANGEAL FUSION (PIP) Right 07/10/2013   Procedure: ARTHRODESIS PIPJ  2ND DIGIT RIGHT FOOT;  Surgeon: Marcheta Grammes, DPM;  Location: [facility];  Service: Orthopedics;  Laterality: Right;    OB History    No data available       Home Medications    Prior to Admission medications   Medication Sig Start Date End Date Taking? Authorizing Provider  Aspirin-Salicylamide-Caffeine (BC HEADACHE) 325-95-16 MG TABS Take 1 packet by mouth daily as needed (for headache/pain).    Historical Provider, MD  hydrochlorothiazide (HYDRODIURIL) 25 MG tablet Take 25 mg by mouth daily.    Historical Provider, MD  metoprolol (LOPRESSOR) 50 MG tablet Take 50 mg by mouth daily.    Historical Provider, MD  naproxen sodium (ALEVE) 220 MG tablet Take 220 mg by mouth daily as needed (for pain).    Historical Provider, MD  oxyCODONE-acetaminophen (PERCOCET) 5-325 MG per tablet Take 2 tablets by mouth every 4 (four) hours as needed. 08/15/13   Nat Christen, MD  promethazine (PHENERGAN) 25 MG tablet Take 1 tablet (25 mg total) by mouth every 6 (six)  hours as needed for nausea or vomiting. 08/15/13   Nat Christen, MD    Family History History reviewed. No pertinent family history.  Social History Social History  Substance Use Topics  . Smoking status: Current Every Day Smoker    Packs/day: 0.25    Years: 37.00    Types: Cigarettes  . Smokeless tobacco: Never Used  . Alcohol use No     Allergies   Penicillins and Sudafed [pseudoephedrine hcl]   Review of Systems Review of Systems  Constitutional: Negative for fever.  Respiratory: Positive for  shortness of breath.   All other systems reviewed and are negative.    Physical Exam Updated Vital Signs BP 154/67   Pulse 114   Temp 98.5 F (36.9 C) (Oral)   Resp 22   Ht 5\' 5"  (1.651 m)   Wt 138 lb (62.6 kg)   SpO2 98%   BMI 22.96 kg/m   Physical Exam  Constitutional: She appears well-developed and well-nourished. She appears distressed.  HENT:  Head: Normocephalic and atraumatic.  Mouth/Throat: Oropharynx is clear and moist. No oropharyngeal exudate.  Eyes: Conjunctivae and EOM are normal. Pupils are equal, round, and reactive to light. Right eye exhibits no discharge. Left eye exhibits no discharge. No scleral icterus.  Neck: Normal range of motion. Neck supple. No JVD present. No thyromegaly present.  Cardiovascular: Regular rhythm, normal heart sounds and intact distal pulses.  Exam reveals no gallop and no friction rub.   No murmur heard. Tachycardia present, no JVD  Pulmonary/Chest: Breath sounds normal. She is in respiratory distress. She has no wheezes. She has no rales.  No obvious wheezing or rales  Abdominal: Soft. Bowel sounds are normal. She exhibits no distension and no mass. There is no tenderness.  Musculoskeletal: Normal range of motion. She exhibits edema ( Focal edema to the left lower extremity greater than the right lower extremity). She exhibits no tenderness.  Lymphadenopathy:    She has no cervical adenopathy.  Neurological: She is alert. Coordination normal.  Skin: Skin is warm and dry. No rash noted. No erythema.  Psychiatric: She has a normal mood and affect. Her behavior is normal.  Nursing note and vitals reviewed.    ED Treatments / Results  Labs (all labs ordered are listed, but only abnormal results are displayed) Labs Reviewed  BASIC METABOLIC PANEL - Abnormal; Notable for the following:       Result Value   Glucose, Bld 135 (*)    Calcium 8.5 (*)    All other components within normal limits  CBC WITH DIFFERENTIAL/PLATELET -  Abnormal; Notable for the following:    WBC 13.4 (*)    Hemoglobin 8.9 (*)    HCT 29.3 (*)    MCV 74.6 (*)    MCH 22.6 (*)    RDW 17.8 (*)    Platelets 496 (*)    Neutro Abs 9.8 (*)    Monocytes Absolute 1.6 (*)    All other components within normal limits  TROPONIN I - Abnormal; Notable for the following:    Troponin I 0.03 (*)    All other components within normal limits  BRAIN NATRIURETIC PEPTIDE - Abnormal; Notable for the following:    B Natriuretic Peptide 446.0 (*)    All other components within normal limits    EKG  EKG Interpretation  Date/Time:  Sunday April 30 2016 07:08:22 EDT Ventricular Rate:  121 PR Interval:    QRS Duration: 92 QT Interval:  338 QTC Calculation:  480 R Axis:   71 Text Interpretation:  Sinus tachycardia Probable left atrial enlargement Minimal ST depression, inferior leads Since last tracing rate faster Confirmed by Marylon Verno  MD, Rosslyn Pasion ([id number]) on 04/30/2016 7:15:51 AM       Radiology Dg Chest 2 View  Result Date: 04/30/2016 CLINICAL DATA:  Shortness of Breath worsening last night. Chest pain. EXAM: CHEST  2 VIEW COMPARISON:  10/21/2015 FINDINGS: Bibasilar opacities, likely atelectasis. Heart is normal size. No visible effusions. No acute bony abnormality. IMPRESSION: Bibasilar atelectasis. Electronically Signed   By: Rolm Baptise M.D.   On: 04/30/2016 07:46  Ct Angio Chest Pe W Or Wo Contrast  Result Date: 04/30/2016 CLINICAL DATA:  60 year old female with acute chest pain and shortness of breath for 1 week. EXAM: CT ANGIOGRAPHY CHEST WITH CONTRAST TECHNIQUE: Multidetector CT imaging of the chest was performed using the standard protocol during bolus administration of intravenous contrast. Multiplanar CT image reconstructions and MIPs were obtained to evaluate the vascular anatomy. CONTRAST:  100 cc intravenous Isovue 370 COMPARISON:  04/30/2016 and prior radiographs.  08/15/2013 chest CT FINDINGS: Cardiovascular: Technically adequate within the  upper and mid lungs but extensive respiratory motion artifact in the lower lungs significantly decreases sensitivity. No large or central pulmonary emboli are identified. Cardiomegaly and mild to moderate coronary artery calcifications noted. There is no evidence of thoracic aortic aneurysm. Mediastinum/Nodes: Upper limits normal size mediastinal and hilar lymph nodes have minimally enlarged since 2014. No mediastinal mass or pericardial effusion identified. Lungs/Pleura: Bilateral interstitial thickening identified and likely representing edema. Very small bilateral pleural effusions and bibasilar atelectasis noted. Mild paraseptal emphysema identified. No definite airspace disease, mass or pneumothorax noted. Upper Abdomen: No significant abnormality Musculoskeletal: No acute or suspicious abnormality. Review of the MIP images confirms the above findings. IMPRESSION: Cardiomegaly with interstitial pulmonary edema and very small bilateral pleural effusions. Mild bibasilar atelectasis. No pulmonary emboli identified, but sub optimal evaluation in the lower lungs. Coronary artery disease. Electronically Signed   By: Margarette Canada M.D.   On: 04/30/2016 09:22   Procedures Procedures (including critical care time)  Medications Ordered in ED Medications  albuterol (PROVENTIL,VENTOLIN) solution continuous neb (10 mg/hr Nebulization Given 04/30/16 0757)  iopamidol (ISOVUE-370) 76 % injection 100 mL (100 mLs Intravenous Contrast Given 04/30/16 0853)  furosemide (LASIX) injection 40 mg (40 mg Intravenous Given 04/30/16 0951)     Initial Impression / Assessment and Plan / ED Course  I have reviewed the triage vital signs and the nursing notes.  Pertinent labs & imaging results that were available during my care of the patient were reviewed by me and considered in my medical decision making (see chart for details).  Discussed care with the hospitalist who will admit the patient to the hospital for what appears to  be congestive heart failure. CT scan is negative for pulmonary embolism but does confirm interstitial edema. Troponin was borderline elevated but I think this is more related to her new anemia and her congestive heart failure  Clinical Course  Comment By Time  Labs reviewed CBC with new anemia BMP normal Trop leaky positive BNP elevated > 400 CXR without answer CT ordered r/o PE Noemi Chapel, MD 07/30 [phone]    The patient appears to be in respiratory distress, I would consider etiologies to include pulmonary embolism, acute coronary syndrome, pneumonia, emphysema, COPD. The patient is in agreement with the plan to start with chest x-ray labs and a breathing treatment.  Final Clinical Impressions(s) / ED Diagnoses  Final diagnoses:  Congestive heart failure, unspecified congestive heart failure chronicity, unspecified congestive heart failure type [facility])    New Prescriptions New Prescriptions   No medications on file     Noemi Chapel, MD 04/30/16 1006

## 2016-04-30 NOTE — ED Notes (Signed)
Attempted to call report. Receiving RN not available at this time.

## 2016-04-30 NOTE — ED Notes (Signed)
Pt brought back from CT to room by Gaetana Michaelis.

## 2016-04-30 NOTE — ED Notes (Signed)
Pt given 12 oz cup of water per verbal order of EDP. Pt tolerating well.

## 2016-04-30 NOTE — H&P (Signed)
History and Physical    AMA MCFERRIN [id number] DOB: 1955-10-23 DOA: 04/30/2016  Referring MD/NP/PA: Noemi Chapel, MD PCP: Collene Mares, PA-C  Outpatient Specialists: none Patient coming from: home  Chief Complaint: Shortness of breath  HPI: Tammy Mcpherson is a 60 y.o. female with medical history significant of bronchitis and HTN, who presents to the ED with complaints of gradually worsening episodes of shortness of breath which started a couple of weeks ago. Symptoms have been progressive, but got significantly worse over the weekend. She has associated chest pain that is located in the substernal area and radiates to the left. No radiation into jaw or arm. Pain is described as sharp. It has been present over the past few weeks with varying intensities, but has never really resolved. She also has associated cough. She has felt chills at home, but no documented fevers. She reports being on blood pressure medications, but discontinued them several months ago, since they made her feel unwell.   ED Course: While being evaluated in the ED, BMP was found to be unremarkable. BNP was noted to be elevated and troponin was mildly elevated. CBC showed mildly elevated WBC, hgb 8.9, and platelets 496. CXR was negative, CT angio showed cardiomegaly with pulmonary edema, small bilateral pleural effusions, and CAD. No PE was identified. Hospitalist was asked to refer for admission for treatment of acute CHF.  Review of Systems: As per HPI otherwise 10 point review of systems negative.   Past Medical History:  Diagnosis Date  . Bronchitis   . Hypertension    diet control    Past Surgical History:  Procedure Laterality Date  . ABDOMINAL HYSTERECTOMY    . Barbie Banner OSTEOTOMY Right 07/10/2013   Procedure: Barbie Banner OSTEOTOMY RIGHT FOOT;  Surgeon: Marcheta Grammes, DPM;  Location: [facility];  Service: Orthopedics;  Laterality: Right;  . BUNIONECTOMY Right 07/10/2013   Procedure: VOGLER BUNIONECTOMY RIGHT  FOOT;  Surgeon: Marcheta Grammes, DPM;  Location: [facility];  Service: Orthopedics;  Laterality: Right;  . METATARSAL HEAD EXCISION Right 07/10/2013   Procedure: METATARSAL HEAD RESECTION OF DIGITS 2 AND 3 RIGHT FOOT;  Surgeon: Marcheta Grammes, DPM;  Location: [facility];  Service: Orthopedics;  Laterality: Right;  . PROXIMAL INTERPHALANGEAL FUSION (PIP) Right 07/10/2013   Procedure: ARTHRODESIS PIPJ  2ND DIGIT RIGHT FOOT;  Surgeon: Marcheta Grammes, DPM;  Location: [facility];  Service: Orthopedics;  Laterality: Right;     reports that she has been smoking Cigarettes.  She has a 9.25 pack-year smoking history. She has never used smokeless tobacco. She reports that she does not drink alcohol or use drugs.  Allergies  Allergen Reactions  . Penicillins Swelling and Rash  . Sudafed [Pseudoephedrine Hcl] Rash   Family history: reviewed. No pertinent family history.  Prior to Admission medications   Medication Sig Start Date End Date Taking? Authorizing Provider  Aspirin-Salicylamide-Caffeine (BC HEADACHE) 325-95-16 MG TABS Take 1 packet by mouth daily as needed (for headache/pain).    Historical Provider, MD  hydrochlorothiazide (HYDRODIURIL) 25 MG tablet Take 25 mg by mouth daily.    Historical Provider, MD  metoprolol (LOPRESSOR) 50 MG tablet Take 50 mg by mouth daily.    Historical Provider, MD  naproxen sodium (ALEVE) 220 MG tablet Take 220 mg by mouth daily as needed (for pain).    Historical Provider, MD  oxyCODONE-acetaminophen (PERCOCET) 5-325 MG per tablet Take 2 tablets by mouth every 4 (four) hours as needed. 08/15/13   Nat Christen, MD  promethazine (PHENERGAN) 25 MG tablet Take 1 tablet (25 mg total) by mouth every 6 (six) hours as needed for nausea or vomiting. 08/15/13   Nat Christen, MD    Physical Exam: Vitals:   04/30/16 0757 04/30/16 0830 04/30/16 0832 04/30/16 0900  BP:   171/75 174/94  Pulse:  117 119 (!) 122  Resp:  19 24   Temp:      TempSrc:      SpO2: 94% 92%  93% 95%  Weight:      Height:        Constitutional: NAD, calm, comfortable Vitals:   04/30/16 0757 04/30/16 0830 04/30/16 0832 04/30/16 0900  BP:   171/75 174/94  Pulse:  117 119 (!) 122  Resp:  19 24   Temp:      TempSrc:      SpO2: 94% 92% 93% 95%  Weight:      Height:       Eyes: PERRL, lids and conjunctivae normal ENMT: Mucous membranes are moist. Posterior pharynx clear of any exudate or lesions.Normal dentition.  Neck: normal, supple, no masses, no thyromegaly Respiratory: bilateral wheezes and crackles. Increased respiratory effort. No accessory muscle use.  Cardiovascular: tachycardic, no murmurs / rubs / gallops. trace extremity edema. 2+ pedal pulses. No carotid bruits.  Abdomen: no tenderness, no masses palpated. No hepatosplenomegaly. Bowel sounds positive.  Musculoskeletal: no clubbing / cyanosis. No joint deformity upper and lower extremities. Good ROM, no contractures. Normal muscle tone.  Skin: no rashes, lesions, ulcers. No induration Neurologic: CN 2-12 grossly intact. Sensation intact, DTR normal. Strength 5/5 in all 4.  Psychiatric: Normal judgment and insight. Alert and oriented x 3. Normal mood.   Labs on Admission: I have personally reviewed following labs and imaging studies  CBC:  Recent Labs Lab 04/30/16 0719  WBC 13.4*  NEUTROABS 9.8*  HGB 8.9*  HCT 29.3*  MCV 74.6*  PLT Q000111Q*   Basic Metabolic Panel:  Recent Labs Lab 04/30/16 0719  NA 141  K 3.7  CL 110  CO2 23  GLUCOSE 135*  BUN 11  CREATININE 0.67  CALCIUM 8.5*   Cardiac Enzymes:  Recent Labs Lab 04/30/16 0719  TROPONINI 0.03*   Urine analysis:    Component Value Date/Time   COLORURINE STRAW (A) 11/03/2012 1701   APPEARANCEUR CLEAR 11/03/2012 1701   LABSPEC <1.005 (L) 11/03/2012 1701   PHURINE 6.5 11/03/2012 1701   GLUCOSEU NEGATIVE 11/03/2012 1701   HGBUR TRACE (A) 11/03/2012 1701   BILIRUBINUR NEGATIVE 11/03/2012 1701   KETONESUR NEGATIVE 11/03/2012 1701    PROTEINUR NEGATIVE 11/03/2012 1701   UROBILINOGEN 0.2 11/03/2012 1701   NITRITE NEGATIVE 11/03/2012 1701   LEUKOCYTESUR NEGATIVE 11/03/2012 1701   Sepsis Labs: @LABRCNTIP (procalcitonin:4,lacticidven:4) )No results found for this or any previous visit (from the past 240 hour(s)).   Radiological Exams on Admission: Dg Chest 2 View  Result Date: 04/30/2016 CLINICAL DATA:  Shortness of Breath worsening last night. Chest pain. EXAM: CHEST  2 VIEW COMPARISON:  10/21/2015 FINDINGS: Bibasilar opacities, likely atelectasis. Heart is normal size. No visible effusions. No acute bony abnormality. IMPRESSION: Bibasilar atelectasis. Electronically Signed   By: Rolm Baptise M.D.   On: 04/30/2016 07:46  Ct Angio Chest Pe W Or Wo Contrast  Result Date: 04/30/2016 CLINICAL DATA:  60 year old female with acute chest pain and shortness of breath for 1 week. EXAM: CT ANGIOGRAPHY CHEST WITH CONTRAST TECHNIQUE: Multidetector CT imaging of the chest was performed using the standard protocol during bolus administration of  intravenous contrast. Multiplanar CT image reconstructions and MIPs were obtained to evaluate the vascular anatomy. CONTRAST:  100 cc intravenous Isovue 370 COMPARISON:  04/30/2016 and prior radiographs.  08/15/2013 chest CT FINDINGS: Cardiovascular: Technically adequate within the upper and mid lungs but extensive respiratory motion artifact in the lower lungs significantly decreases sensitivity. No large or central pulmonary emboli are identified. Cardiomegaly and mild to moderate coronary artery calcifications noted. There is no evidence of thoracic aortic aneurysm. Mediastinum/Nodes: Upper limits normal size mediastinal and hilar lymph nodes have minimally enlarged since 2014. No mediastinal mass or pericardial effusion identified. Lungs/Pleura: Bilateral interstitial thickening identified and likely representing edema. Very small bilateral pleural effusions and bibasilar atelectasis noted. Mild  paraseptal emphysema identified. No definite airspace disease, mass or pneumothorax noted. Upper Abdomen: No significant abnormality Musculoskeletal: No acute or suspicious abnormality. Review of the MIP images confirms the above findings. IMPRESSION: Cardiomegaly with interstitial pulmonary edema and very small bilateral pleural effusions. Mild bibasilar atelectasis. No pulmonary emboli identified, but sub optimal evaluation in the lower lungs. Coronary artery disease. Electronically Signed   By: Margarette Canada M.D.   On: 04/30/2016 09:22   EKG: Independently reviewed. Sinus tach  Assessment/Plan Active Problems:   CHF (congestive heart failure) ([place])  1. Acute CHF. This is a new diagnosis. Possibly related to uncontrolled blood pressure. BNP elevated at 496 with minimal troponin elevation. CTA showed cardiomegaly and vascular congestion and no evidence of PE. LLE edema noted on exam but patient reports this is a chronic finding. Will order ECHO. Will start the patient on lasix, monitor I+Os. Will start the patient on BB/ACE and aspirin. Cycle cardiac markers.  2. COPD exacerbation. With her long history of smoking, she likely has some degree of copd. She is actively wheezing. Will continue on neb treatments and start steroids/abx 3. Acute resp failure with hypoxia. Likely related to CHF/COPD. Wean off oxygen as tolerated. 4. Anemia. This is a new problem. Hgb noted to be 8.9. Patient's platelets are elevated and there are no obvious signs of bleeding. Check FOBT. Check anemia panel. 5. Leukocytosis. WBC mildly elevated. Patient is afebrile. Possibly related to stress. Continue to monitor. 6. HTN. Will start the patient on antihypertensives as above. Continue to monitor. 7. Tobacco abuse. Counseled on the importance of tobacco cessation.  DVT prophylaxis: Lovenox  Code Status: Full Family Communication: discussed with patient and family at the bedside Disposition Plan: admit as inpatient to medical  floor. Discharge home once improved. Consults called: none Admission status: admit as inpatient to telemetry.  Kathie Dike, MD [facility] Pager [phone]  If 7PM-7AM, please contact night-coverage [email or web address] Password [email or web address]  04/30/2016, 9:53 AM

## 2016-04-30 NOTE — ED Notes (Signed)
CRITICAL VALUE ALERT  Critical value received:  Troponin 0.03  Date of notification:  04/30/16  Time of notification:  [id number]  Critical value read back:Yes.    Nurse who received alert:  Parthenia Ames  MD notified (1st page):  Dr. Sabra Heck  Time of first page:  58  MD notified (2nd page):  Time of second page:  Responding MD:  Dr. Sabra Heck  Time MD responded:  755

## 2016-05-01 ENCOUNTER — Inpatient Hospital Stay (HOSPITAL_COMMUNITY): Payer: PRIVATE HEALTH INSURANCE

## 2016-05-01 DIAGNOSIS — I509 Heart failure, unspecified: Secondary | ICD-10-CM

## 2016-05-01 LAB — ECHOCARDIOGRAM COMPLETE
AVLVOTPG: 6 mmHg
CHL CUP DOP CALC LVOT VTI: 26 cm
CHL CUP MV DEC (S): 280
CHL CUP RV SYS PRESS: 26 mmHg
CHL CUP TV REG PEAK VELOCITY: 239 cm/s
E decel time: 280 msec
FS: 22 % — AB (ref 28–44)
Height: 65 in
IV/PV OW: 0.98
LA diam index: 2.6 cm/m2
LA vol: 82.1 mL
LASIZE: 45 mm
LAVOLA4C: 91.8 mL
LAVOLIN: 47.4 mL/m2
LEFT ATRIUM END SYS DIAM: 45 mm
LV SIMPSON'S DISK: 50
LV dias vol: 111 mL — AB (ref 46–106)
LV sys vol index: 32 mL/m2
LVDIAVOLIN: 64 mL/m2
LVOT area: 2.84 cm2
LVOT diameter: 19 mm
LVOT peak vel: 127 cm/s
LVOTSV: 74 mL
LVSYSVOL: 56 mL — AB (ref 14–42)
Lateral S' vel: 12.9 cm/s
MVPG: 8 mmHg
MVPKAVEL: 141 m/s
MVPKEVEL: 142 m/s
PW: 12.3 mm — AB (ref 0.6–1.1)
Stroke v: 55 ml
TAPSE: 24.4 mm
TR max vel: 239 cm/s
Weight: 2291.2 oz

## 2016-05-01 LAB — BASIC METABOLIC PANEL
ANION GAP: 9 (ref 5–15)
BUN: 19 mg/dL (ref 6–20)
CALCIUM: 8.8 mg/dL — AB (ref 8.9–10.3)
CO2: 25 mmol/L (ref 22–32)
CREATININE: 0.64 mg/dL (ref 0.44–1.00)
Chloride: 104 mmol/L (ref 101–111)
Glucose, Bld: 166 mg/dL — ABNORMAL HIGH (ref 65–99)
Potassium: 3.5 mmol/L (ref 3.5–5.1)
SODIUM: 138 mmol/L (ref 135–145)

## 2016-05-01 LAB — CBC WITH DIFFERENTIAL/PLATELET
BASOS ABS: 0 10*3/uL (ref 0.0–0.1)
Basophils Relative: 0 %
EOS ABS: 0 10*3/uL (ref 0.0–0.7)
Eosinophils Relative: 0 %
HCT: 31.3 % — ABNORMAL LOW (ref 36.0–46.0)
HEMOGLOBIN: 9.2 g/dL — AB (ref 12.0–15.0)
LYMPHS PCT: 5 %
Lymphs Abs: 0.7 10*3/uL (ref 0.7–4.0)
MCH: 21.8 pg — AB (ref 26.0–34.0)
MCHC: 29.4 g/dL — ABNORMAL LOW (ref 30.0–36.0)
MCV: 74.2 fL — ABNORMAL LOW (ref 78.0–100.0)
MONO ABS: 0.4 10*3/uL (ref 0.1–1.0)
Monocytes Relative: 3 %
NEUTROS PCT: 92 %
Neutro Abs: 12.2 10*3/uL — ABNORMAL HIGH (ref 1.7–7.7)
PLATELETS: 589 10*3/uL — AB (ref 150–400)
RBC: 4.22 MIL/uL (ref 3.87–5.11)
RDW: 17.9 % — ABNORMAL HIGH (ref 11.5–15.5)
WBC: 13.3 10*3/uL — AB (ref 4.0–10.5)

## 2016-05-01 LAB — VITAMIN B12: VITAMIN B 12: 323 pg/mL (ref 180–914)

## 2016-05-01 LAB — TROPONIN I
Troponin I: 0.04 ng/mL (ref <0.03)
Troponin I: <0.03 ng/mL (ref <0.03)

## 2016-05-01 LAB — IRON AND TIBC
IRON: 14 ug/dL — AB (ref 28–170)
SATURATION RATIOS: 3 % — AB (ref 10.4–31.8)
TIBC: 511 ug/dL — AB (ref 250–450)
UIBC: 497 ug/dL

## 2016-05-01 LAB — FERRITIN: Ferritin: 7 ng/mL — ABNORMAL LOW (ref 11–307)

## 2016-05-01 MED ORDER — IPRATROPIUM-ALBUTEROL 0.5-2.5 (3) MG/3ML IN SOLN
3.0000 mL | Freq: Three times a day (TID) | RESPIRATORY_TRACT | Status: DC
  Administered 2016-05-02 – 2016-05-03 (×4): 3 mL via RESPIRATORY_TRACT
  Filled 2016-05-01 (×5): qty 3

## 2016-05-01 NOTE — Progress Notes (Signed)
PROGRESS NOTE    Tammy Mcpherson  [id number] DOB: 1955-12-11 DOA: 04/30/2016 PCP: Collene Mares, PA-C  Outpatient Specialists: None    Brief Narrative:  13 of with medical history of bronchitis and HTN presented with complaints of worsening SOB associated chest pain described as sharp located in the substernal area and radiates to the left. In the ED, CT angio showed cardiomegaly with pulmonary edema, small bilateral pleural effusions, and CAD. Referred for admission for treatment of CHF.    Assessment & Plan:   Active Problems:   CHF (congestive heart failure) (HCC)   Acute respiratory failure with hypoxia (HCC)   COPD exacerbation (HCC)   Benign essential HTN   Anemia   Tobacco use disorder  1. Acute CHF. This is a new diagnosis. Possibly related to uncontrolled blood pressure. BNP elevated at 496 with minimal troponin elevation. CTA showed cardiomegaly and vascular congestion and no evidence of PE. LLE edema noted on exam but patient reports this is a chronic finding. Follow-up ECHO. Has been started on lasix, with good UOP. Has been started on BB/ACE and aspirin. Cardiac markers negative. Still has evidence of volume overload. Continue current treatments.  2. COPD exacerbation. With her long history of smoking, she likely has some degree of copd. Wheezing has resolved. Continue on neb treatments and steroids/abx. 3. Acute resp failure with hypoxia. Likely related to CHF/COPD. Wean off oxygen as tolerated. 4. Anemia. This is a new problem. Hgb noted to be 9.2 today. Patient's platelets are elevated and there are no obvious signs of bleeding. Check FOBT. Folate wnl remaining anemia panel in process. 5. Leukocytosis. WBC mildly elevated, possibly related to stress. Patient is afebrile. Continue to monitor. 6. HTN. Will start the patient on antihypertensives as above. Continue to monitor. 7. Tobacco abuse. Counseled on the importance of tobacco cessation.   DVT prophylaxis:  Lovenox Code Status: Full Family Communication: Family bedside  Disposition Plan: Anticipate discharge in 24 hours.    Consultants:   None   Procedures:   None   Antimicrobials:   Azithromycin 7/30>>   Subjective: Feels well. Breathing has improved. SOB with walking. Swelling BLE has improved. Has not had any bowel movements today.   Objective: Vitals:   05/01/16 0529 05/01/16 0533 05/01/16 0639 05/01/16 0758  BP:  (!) 143/74    Pulse:  97    Resp:  (!) 22    Temp:  98.6 F (37 C)    TempSrc:  Oral    SpO2: 90% 92%  92%  Weight:   65 kg (143 lb 3.2 oz)   Height:        Intake/Output Summary (Last 24 hours) at 05/01/16 1045 Last data filed at 04/30/16 1900  Gross per 24 hour  Intake              240 ml  Output             1800 ml  Net            -1560 ml   Filed Weights   04/30/16 0708 04/30/16 1139 05/01/16 0639  Weight: 62.6 kg (138 lb) 68.1 kg (150 lb 2.1 oz) 65 kg (143 lb 3.2 oz)    Examination:  General exam: Appears calm and comfortable  Respiratory system: Crackles at bases. No wheezes. Respiratory effort normal. Cardiovascular system: . No JVD, murmurs, rubs, gallops or clicks. Tachycardic. Trace pedal edema. Gastrointestinal system: Abdomen is nondistended, soft and nontender. No organomegaly or masses felt. Normal bowel  sounds heard. Central nervous system: Alert and oriented. No focal neurological deficits. Extremities: Symmetric 5 x 5 power. Skin: No rashes, lesions or ulcers Psychiatry: Judgement and insight appear normal. Mood & affect appropriate.     Data Reviewed: I have personally reviewed following labs and imaging studies  CBC:  Recent Labs Lab 04/30/16 0719 05/01/16 0619  WBC 13.4* 13.3*  NEUTROABS 9.8* 12.2*  HGB 8.9* 9.2*  HCT 29.3* 31.3*  MCV 74.6* 74.2*  PLT 496* [id number]*   Basic Metabolic Panel:  Recent Labs Lab 04/30/16 0719 05/01/16 0619  NA 141 138  K 3.7 3.5  CL 110 104  CO2 23 25  GLUCOSE 135* 166*  BUN  11 19  CREATININE 0.67 0.64  CALCIUM 8.5* 8.8*   GFR: Estimated Creatinine Clearance: 69 mL/min (by C-G formula based on SCr of 0.8 mg/dL). Liver Function Tests: No results for input(s): AST, ALT, ALKPHOS, BILITOT, PROT, ALBUMIN in the last 168 hours. No results for input(s): LIPASE, AMYLASE in the last 168 hours. No results for input(s): AMMONIA in the last 168 hours. Coagulation Profile: No results for input(s): INR, PROTIME in the last 168 hours. Cardiac Enzymes:  Recent Labs Lab 04/30/16 0719 04/30/16 1714 04/30/16 2326 05/01/16 0619  TROPONINI 0.03* 0.05* 0.04* <0.03   BNP (last 3 results) No results for input(s): PROBNP in the last 8760 hours. HbA1C: No results for input(s): HGBA1C in the last 72 hours. CBG: No results for input(s): GLUCAP in the last 168 hours. Lipid Profile: No results for input(s): CHOL, HDL, LDLCALC, TRIG, CHOLHDL, LDLDIRECT in the last 72 hours. Thyroid Function Tests:  Recent Labs  04/30/16 0719  TSH 1.727   Anemia Panel:  Recent Labs  04/30/16 1714  FOLATE 22.3  RETICCTPCT 2.4   Urine analysis:    Component Value Date/Time   COLORURINE STRAW (A) 11/03/2012 1701   APPEARANCEUR CLEAR 11/03/2012 1701   LABSPEC <1.005 (L) 11/03/2012 1701   PHURINE 6.5 11/03/2012 1701   GLUCOSEU NEGATIVE 11/03/2012 1701   HGBUR TRACE (A) 11/03/2012 1701   BILIRUBINUR NEGATIVE 11/03/2012 1701   KETONESUR NEGATIVE 11/03/2012 1701   PROTEINUR NEGATIVE 11/03/2012 1701   UROBILINOGEN 0.2 11/03/2012 1701   NITRITE NEGATIVE 11/03/2012 1701   LEUKOCYTESUR NEGATIVE 11/03/2012 1701   Sepsis Labs: @LABRCNTIP (procalcitonin:4,lacticidven:4)  )No results found for this or any previous visit (from the past 240 hour(s)).       Radiology Studies: Dg Chest 2 View  Result Date: 04/30/2016 CLINICAL DATA:  Shortness of Breath worsening last night. Chest pain. EXAM: CHEST  2 VIEW COMPARISON:  10/21/2015 FINDINGS: Bibasilar opacities, likely atelectasis.  Heart is normal size. No visible effusions. No acute bony abnormality. IMPRESSION: Bibasilar atelectasis. Electronically Signed   By: Rolm Baptise M.D.   On: 04/30/2016 07:46  Ct Angio Chest Pe W Or Wo Contrast  Result Date: 04/30/2016 CLINICAL DATA:  60 year old female with acute chest pain and shortness of breath for 1 week. EXAM: CT ANGIOGRAPHY CHEST WITH CONTRAST TECHNIQUE: Multidetector CT imaging of the chest was performed using the standard protocol during bolus administration of intravenous contrast. Multiplanar CT image reconstructions and MIPs were obtained to evaluate the vascular anatomy. CONTRAST:  100 cc intravenous Isovue 370 COMPARISON:  04/30/2016 and prior radiographs.  08/15/2013 chest CT FINDINGS: Cardiovascular: Technically adequate within the upper and mid lungs but extensive respiratory motion artifact in the lower lungs significantly decreases sensitivity. No large or central pulmonary emboli are identified. Cardiomegaly and mild to moderate coronary artery calcifications noted. There is  no evidence of thoracic aortic aneurysm. Mediastinum/Nodes: Upper limits normal size mediastinal and hilar lymph nodes have minimally enlarged since 2014. No mediastinal mass or pericardial effusion identified. Lungs/Pleura: Bilateral interstitial thickening identified and likely representing edema. Very small bilateral pleural effusions and bibasilar atelectasis noted. Mild paraseptal emphysema identified. No definite airspace disease, mass or pneumothorax noted. Upper Abdomen: No significant abnormality Musculoskeletal: No acute or suspicious abnormality. Review of the MIP images confirms the above findings. IMPRESSION: Cardiomegaly with interstitial pulmonary edema and very small bilateral pleural effusions. Mild bibasilar atelectasis. No pulmonary emboli identified, but sub optimal evaluation in the lower lungs. Coronary artery disease. Electronically Signed   By: Margarette Canada M.D.   On: 04/30/2016  09:22       Scheduled Meds: . antiseptic oral rinse  7 mL Mouth Rinse BID  . aspirin EC  81 mg Oral Daily  . azithromycin  250 mg Oral Daily  . enoxaparin (LOVENOX) injection  40 mg Subcutaneous Q24H  . furosemide  40 mg Intravenous BID  . guaiFENesin  1,200 mg Oral BID  . ipratropium-albuterol  3 mL Nebulization Q6H  . lisinopril  5 mg Oral Daily  . methylPREDNISolone (SOLU-MEDROL) injection  60 mg Intravenous Q12H  . metoprolol tartrate  25 mg Oral Q12H  . sodium chloride flush  3 mL Intravenous Q12H   Continuous Infusions:    LOS: 1 day    Time spent: 20 minutes    Kathie Dike, MD  [facility] If 7PM-7AM, please contact night-coverage [email or web address] Password [email or web address] 05/01/2016, 10:45 AM    By signing my name below, I, Collene Leyden, attest that this documentation has been prepared under the direction and in the presence of Kathie Dike, MD. Electronically signed: Collene Leyden, Scribe. 05/01/16 11:25AM   I, Dr. Kathie Dike, personally performed the services described in this documentaiton. All medical record entries made by the scribe were at my direction and in my presence. I have reviewed the chart and agree that the record reflects my personal performance and is accurate and complete  Kathie Dike, MD, 05/01/2016 11:33 AM

## 2016-05-01 NOTE — Progress Notes (Signed)
*  PRELIMINARY RESULTS* Echocardiogram 2D Echocardiogram has been performed.  Tammy Mcpherson, Tammy Mcpherson 05/01/2016, 3:40 PM

## 2016-05-01 NOTE — Care Management Note (Signed)
Case Management Note  Patient Details  Name: Tammy Mcpherson MRN: [id number] Date of Birth: 15-Sep-1956  Subjective/Objective:    Patient adm from home with acute CHF, new diagnosis. Patient is ind with ADL's. Has a PCP and insurance, reports no issues.                Action/Plan: Anticipate DC home with self care. May oxygen assessment prior to DC if not weaned off. Will follow.   Expected Discharge Date:                  Expected Discharge Plan:  Home/Self Care  In-House Referral:  NA  Discharge planning Services  CM Consult  Post Acute Care Choice:  NA Choice offered to:  NA  DME Arranged:    DME Agency:     HH Arranged:    HH Agency:     Status of Service:  In process, will continue to follow  If discussed at Long Length of Stay Meetings, dates discussed:    Additional Comments:  Keyry Iracheta, Chauncey Reading, RN 05/01/2016, 12:57 PM

## 2016-05-01 NOTE — Progress Notes (Signed)
Patient sates she is feeling better her saturation is still low 90 on 4 lpm / [place].

## 2016-05-02 ENCOUNTER — Encounter (HOSPITAL_COMMUNITY): Payer: Self-pay | Admitting: Cardiology

## 2016-05-02 DIAGNOSIS — I2510 Atherosclerotic heart disease of native coronary artery without angina pectoris: Secondary | ICD-10-CM | POA: Diagnosis present

## 2016-05-02 DIAGNOSIS — I5041 Acute combined systolic (congestive) and diastolic (congestive) heart failure: Secondary | ICD-10-CM

## 2016-05-02 DIAGNOSIS — I25709 Atherosclerosis of coronary artery bypass graft(s), unspecified, with unspecified angina pectoris: Secondary | ICD-10-CM

## 2016-05-02 DIAGNOSIS — I428 Other cardiomyopathies: Secondary | ICD-10-CM

## 2016-05-02 DIAGNOSIS — I5021 Acute systolic (congestive) heart failure: Secondary | ICD-10-CM

## 2016-05-02 DIAGNOSIS — R7989 Other specified abnormal findings of blood chemistry: Secondary | ICD-10-CM

## 2016-05-02 DIAGNOSIS — I429 Cardiomyopathy, unspecified: Secondary | ICD-10-CM

## 2016-05-02 DIAGNOSIS — R778 Other specified abnormalities of plasma proteins: Secondary | ICD-10-CM | POA: Diagnosis present

## 2016-05-02 LAB — BASIC METABOLIC PANEL
ANION GAP: 9 (ref 5–15)
BUN: 30 mg/dL — AB (ref 6–20)
CHLORIDE: 104 mmol/L (ref 101–111)
CO2: 26 mmol/L (ref 22–32)
Calcium: 9.1 mg/dL (ref 8.9–10.3)
Creatinine, Ser: 1.05 mg/dL — ABNORMAL HIGH (ref 0.44–1.00)
GFR, EST NON AFRICAN AMERICAN: 57 mL/min — AB (ref >60)
Glucose, Bld: 141 mg/dL — ABNORMAL HIGH (ref 65–99)
POTASSIUM: 4.2 mmol/L (ref 3.5–5.1)
SODIUM: 139 mmol/L (ref 135–145)

## 2016-05-02 LAB — CBC
HEMATOCRIT: 32.9 % — AB (ref 36.0–46.0)
Hemoglobin: 9.6 g/dL — ABNORMAL LOW (ref 12.0–15.0)
MCH: 21.7 pg — ABNORMAL LOW (ref 26.0–34.0)
MCHC: 29.2 g/dL — ABNORMAL LOW (ref 30.0–36.0)
MCV: 74.4 fL — ABNORMAL LOW (ref 78.0–100.0)
Platelets: 710 10*3/uL — ABNORMAL HIGH (ref 150–400)
RBC: 4.42 MIL/uL (ref 3.87–5.11)
RDW: 17.7 % — AB (ref 11.5–15.5)
WBC: 29.1 10*3/uL — AB (ref 4.0–10.5)

## 2016-05-02 MED ORDER — MAGNESIUM CITRATE PO SOLN
1.0000 | Freq: Once | ORAL | Status: AC
  Administered 2016-05-02: 1 via ORAL
  Filled 2016-05-02: qty 296

## 2016-05-02 MED ORDER — PREDNISONE 20 MG PO TABS
40.0000 mg | ORAL_TABLET | Freq: Every day | ORAL | Status: DC
  Administered 2016-05-03: 40 mg via ORAL
  Filled 2016-05-02: qty 2

## 2016-05-02 MED ORDER — BISOPROLOL FUMARATE 5 MG PO TABS
5.0000 mg | ORAL_TABLET | Freq: Every day | ORAL | Status: DC
  Administered 2016-05-02 – 2016-05-03 (×2): 5 mg via ORAL
  Filled 2016-05-02 (×4): qty 1

## 2016-05-02 MED ORDER — FERROUS GLUCONATE 324 (38 FE) MG PO TABS
324.0000 mg | ORAL_TABLET | Freq: Two times a day (BID) | ORAL | Status: DC
Start: 2016-05-02 — End: 2016-05-03
  Administered 2016-05-02 – 2016-05-03 (×2): 324 mg via ORAL
  Filled 2016-05-02 (×6): qty 1

## 2016-05-02 MED ORDER — FUROSEMIDE 10 MG/ML IJ SOLN
40.0000 mg | Freq: Every day | INTRAMUSCULAR | Status: DC
  Administered 2016-05-03: 40 mg via INTRAVENOUS
  Filled 2016-05-02: qty 4

## 2016-05-02 MED ORDER — SPIRONOLACTONE 25 MG PO TABS
12.5000 mg | ORAL_TABLET | Freq: Every day | ORAL | Status: DC
  Administered 2016-05-02 – 2016-05-03 (×2): 12.5 mg via ORAL
  Filled 2016-05-02 (×2): qty 1

## 2016-05-02 MED ORDER — LOSARTAN POTASSIUM 25 MG PO TABS
12.5000 mg | ORAL_TABLET | Freq: Every day | ORAL | Status: DC
  Administered 2016-05-02 – 2016-05-03 (×2): 12.5 mg via ORAL
  Filled 2016-05-02 (×4): qty 0.5

## 2016-05-02 NOTE — Progress Notes (Signed)
PROGRESS NOTE    Tammy Mcpherson  [id number] DOB: 1956/02/12 DOA: 04/30/2016 PCP: Collene Mares, PA-C  Outpatient Specialists: None    Brief Narrative:  49 of with medical history of bronchitis and HTN presented with complaints of worsening SOB associated chest pain described as sharp located in the substernal area and radiates to the left. In the ED, CT angio showed cardiomegaly with pulmonary edema, small bilateral pleural effusions, and CAD. Referred for admission for treatment of CHF. ECHO showed systolic CHF with EF [id number]. EKG showed sinus tachycardia. She has been treated with IV lasix, nebs and steroids. Overall respiratory status is improving. Cardiology consulted for cardiomyopathy.   Assessment & Plan:   Active Problems:   Acute systolic CHF (congestive heart failure) (HCC)   Acute respiratory failure with hypoxia (HCC)   COPD exacerbation (HCC)   Benign essential HTN   Anemia   Tobacco use disorder  1. Acute systolic CHF. This is a new diagnosis. Possibly related to uncontrolled blood pressure. BNP elevated at 496 with minimal troponin elevation. CTA showed cardiomegaly and vascular congestion and no evidence of PE. ECHO showed systolic CHF of EF [id number]. She has not had any prior invasive work up. Continue lasix, with good UOP. Continue BB/ACE and ASA. Cardiac markers negative. Still has evidence of volume overload. Will consult cardiology to see if any further work up for cardiomyopathy needs to be done at this time. Continue current treatments.  2. COPD exacerbation. With her long history of smoking, she likely has some degree of copd. Wheezing has resolved. Continue on neb treatments. Continue abx and taper steroids. She will need outpatient PFTs when stable. 3. Acute resp failure with hypoxia. Likely related to CHF/COPD. Wean off oxygen as tolerated. 4. Anemia. Hgb noted to be 9.6 today. Patient's platelets continue to be elevated and there are no obvious signs of  bleeding. Check FOBT. Folate/b12 wnl. Remaining anemia panel indicates iron deficiency. Will start on iron supplements. Stool for occult blood has been ordered. Further work up for iron def anemia can be done as an outpatient. 5. Leukocytosis. WBC elevated at 29.1, possibly related to steroids. Patient is afebrile and non toxic. Continue to monitor, now that steroids are being tapered. 6. HTN. Continue on antihypertensives as above. Continue to monitor. 7. Tobacco abuse. Counseled on the importance of cessation.   DVT prophylaxis: Lovenox Code Status: Full Family Communication: discussed with patient and family at the bedside Disposition Plan: Anticipate discharge in 1-2 days    Consultants:   None   Procedures:   ECHO Study Conclusions  - Left ventricle: The cavity size was moderately dilated. Wall   thickness was increased in a pattern of mild LVH. Systolic   function was mildly to moderately reduced. The estimated ejection   fraction was in the range of 40% to 45%. Diffuse hypokinesis. The   study is not technically sufficient to allow evaluation of LV   diastolic function. Doppler parameters are consistent with both   elevated ventricular end-diastolic filling pressure and elevated   left atrial filling pressure. - Mitral valve: Mildly thickened leaflets . There was trivial   regurgitation. - Left atrium: The atrium was moderately dilated. - Right atrium: Central venous pressure (est): 3 mm Hg. - Tricuspid valve: There was trivial regurgitation. - Pulmonary arteries: PA peak pressure: 26 mm Hg (S). - Pericardium, extracardiac: There was no pericardial effusion.  Antimicrobials:   Azithromycin 7/30>>   Subjective: Breathing is doing better. Had some nausea overnight. Last bowel  movement ws 3-4 days ago  Objective: Vitals:   05/01/16 1458 05/01/16 2046 05/01/16 2148 05/02/16 0602  BP:   (!) 138/53 136/61  Pulse:   (!) 103 95  Resp:   18 17  Temp:   98.5 F (36.9  C) 98.2 F (36.8 C)  TempSrc:   Oral Oral  SpO2: 94% 97% 97% 100%  Weight:    66.4 kg (146 lb 4.8 oz)  Height:        Intake/Output Summary (Last 24 hours) at 05/02/16 0749 Last data filed at 05/02/16 0604  Gross per 24 hour  Intake              600 ml  Output             2950 ml  Net            -2350 ml   Filed Weights   04/30/16 1139 05/01/16 0639 05/02/16 0602  Weight: 68.1 kg (150 lb 2.1 oz) 65 kg (143 lb 3.2 oz) 66.4 kg (146 lb 4.8 oz)    Examination:  General exam: Appears calm and comfortable  Respiratory system: Crackles at bases. No wheezes. Respiratory effort normal. Cardiovascular system: . No JVD, murmurs, rubs, gallops or clicks. Tachycardic. Trace pedal edema. Gastrointestinal system: Abdomen is nondistended, soft and nontender. No organomegaly or masses felt. Normal bowel sounds heard. Central nervous system: Alert and oriented. No focal neurological deficits. Extremities: Symmetric 5 x 5 power. Skin: No rashes, lesions or ulcers Psychiatry: Judgement and insight appear normal. Mood & affect appropriate.     Data Reviewed:  CBC:  Recent Labs Lab 04/30/16 0719 05/01/16 0619 05/02/16 0522  WBC 13.4* 13.3* 29.1*  NEUTROABS 9.8* 12.2*  --   HGB 8.9* 9.2* 9.6*  HCT 29.3* 31.3* 32.9*  MCV 74.6* 74.2* 74.4*  PLT 496* 589* [id number]*   Basic Metabolic Panel:  Recent Labs Lab 04/30/16 0719 05/01/16 0619 05/02/16 0522  NA 141 138 139  K 3.7 3.5 4.2  CL 110 104 104  CO2 23 25 26   GLUCOSE 135* 166* 141*  BUN 11 19 30*  CREATININE 0.67 0.64 1.05*  CALCIUM 8.5* 8.8* 9.1   GFR: Estimated Creatinine Clearance: 52.6 mL/min (by C-G formula based on SCr of 1.05 mg/dL). Liver Function Tests: No results for input(s): AST, ALT, ALKPHOS, BILITOT, PROT, ALBUMIN in the last 168 hours. No results for input(s): LIPASE, AMYLASE in the last 168 hours. No results for input(s): AMMONIA in the last 168 hours. Coagulation Profile: No results for input(s): INR, PROTIME  in the last 168 hours. Cardiac Enzymes:  Recent Labs Lab 04/30/16 0719 04/30/16 1714 04/30/16 2326 05/01/16 0619  TROPONINI 0.03* 0.05* 0.04* <0.03   BNP (last 3 results) No results for input(s): PROBNP in the last 8760 hours. HbA1C: No results for input(s): HGBA1C in the last 72 hours. CBG: No results for input(s): GLUCAP in the last 168 hours. Lipid Profile: No results for input(s): CHOL, HDL, LDLCALC, TRIG, CHOLHDL, LDLDIRECT in the last 72 hours. Thyroid Function Tests:  Recent Labs  04/30/16 0719  TSH 1.727   Anemia Panel:  Recent Labs  04/30/16 1714  VITAMINB12 323  FOLATE 22.3  FERRITIN 7*  TIBC 511*  IRON 14*  RETICCTPCT 2.4   Urine analysis:    Component Value Date/Time   COLORURINE STRAW (A) 11/03/2012 1701   APPEARANCEUR CLEAR 11/03/2012 1701   LABSPEC <1.005 (L) 11/03/2012 1701   PHURINE 6.5 11/03/2012 1701   GLUCOSEU NEGATIVE 11/03/2012 1701  HGBUR TRACE (A) 11/03/2012 1701   BILIRUBINUR NEGATIVE 11/03/2012 1701   KETONESUR NEGATIVE 11/03/2012 1701   PROTEINUR NEGATIVE 11/03/2012 1701   UROBILINOGEN 0.2 11/03/2012 1701   NITRITE NEGATIVE 11/03/2012 1701   LEUKOCYTESUR NEGATIVE 11/03/2012 1701   Sepsis Labs: @LABRCNTIP (procalcitonin:4,lacticidven:4)  )No results found for this or any previous visit (from the past 240 hour(s)).       Radiology Studies: Ct Angio Chest Pe W Or Wo Contrast  Result Date: 04/30/2016 CLINICAL DATA:  60 year old female with acute chest pain and shortness of breath for 1 week. EXAM: CT ANGIOGRAPHY CHEST WITH CONTRAST TECHNIQUE: Multidetector CT imaging of the chest was performed using the standard protocol during bolus administration of intravenous contrast. Multiplanar CT image reconstructions and MIPs were obtained to evaluate the vascular anatomy. CONTRAST:  100 cc intravenous Isovue 370 COMPARISON:  04/30/2016 and prior radiographs.  08/15/2013 chest CT FINDINGS: Cardiovascular: Technically adequate within the  upper and mid lungs but extensive respiratory motion artifact in the lower lungs significantly decreases sensitivity. No large or central pulmonary emboli are identified. Cardiomegaly and mild to moderate coronary artery calcifications noted. There is no evidence of thoracic aortic aneurysm. Mediastinum/Nodes: Upper limits normal size mediastinal and hilar lymph nodes have minimally enlarged since 2014. No mediastinal mass or pericardial effusion identified. Lungs/Pleura: Bilateral interstitial thickening identified and likely representing edema. Very small bilateral pleural effusions and bibasilar atelectasis noted. Mild paraseptal emphysema identified. No definite airspace disease, mass or pneumothorax noted. Upper Abdomen: No significant abnormality Musculoskeletal: No acute or suspicious abnormality. Review of the MIP images confirms the above findings. IMPRESSION: Cardiomegaly with interstitial pulmonary edema and very small bilateral pleural effusions. Mild bibasilar atelectasis. No pulmonary emboli identified, but sub optimal evaluation in the lower lungs. Coronary artery disease. Electronically Signed   By: Margarette Canada M.D.   On: 04/30/2016 09:22       Scheduled Meds: . antiseptic oral rinse  7 mL Mouth Rinse BID  . aspirin EC  81 mg Oral Daily  . azithromycin  250 mg Oral Daily  . enoxaparin (LOVENOX) injection  40 mg Subcutaneous Q24H  . furosemide  40 mg Intravenous BID  . guaiFENesin  1,200 mg Oral BID  . ipratropium-albuterol  3 mL Nebulization TID  . lisinopril  5 mg Oral Daily  . methylPREDNISolone (SOLU-MEDROL) injection  60 mg Intravenous Q12H  . metoprolol tartrate  25 mg Oral Q12H  . sodium chloride flush  3 mL Intravenous Q12H   Continuous Infusions:    LOS: 2 days    Time spent: 25 minutes    Kathie Dike, MD  [facility] If 7PM-7AM, please contact night-coverage [email or web address] Password [facility] 05/02/2016, 7:49 AM

## 2016-05-02 NOTE — Consult Note (Signed)
Requesting provider: Dr. Kathie Dike Consulting cardiologist: Dr. Satira Sark  Reason for consultation: Newly documented cardiomyopathy  Clinical Summary Tammy Mcpherson is a 60 y.o.female with past medical history outlined below, currently admitted to the hospital with one week history of shortness of breath and atypical, anterior lower costal discomfort. She does not report any productive cough, but had some mild wheezing and thought that it was bronchitis since she has had this in the past. She also describes orthopnea over this time period, sleeping in a recliner, no major degree of leg edema beyond some mild left ankle edema at presentation. Chest CT revealed cardiomegaly with interstitial edema and very small bilateral pleural effusions. She was also noted to have mild to moderate coronary artery calcifications. She has been treated for bronchitis with antibiotics and steroids, also placed on Lasix and her echocardiogram demonstrates evidence of cardiomyopathy with LVEF 40-45% as outlined below. She is not aware of any prior history of congestive heart failure or CAD.  She does describe history of hypertension, has not been on regular medications as an outpatient. She follows at [facility] for primary care. No definite history of arrhythmias, describes frequent sense of palpitations however. She has had no dizziness or syncope.   Allergies  Allergen Reactions  . Codeine Anaphylaxis  . Penicillins Swelling and Rash    Has patient had a PCN reaction causing immediate rash, facial/tongue/throat swelling, SOB or lightheadedness with hypotension: yes Has patient had a PCN reaction causing severe rash involving mucus membranes or skin necrosis: No Has patient had a PCN reaction that required hospitalization No Has patient had a PCN reaction occurring within the last 10 years: No If all of the above answers are "NO", then may proceed with Cephalosporin use.   Tammy Mcpherson [Pseudoephedrine  Hcl] Rash    Medications Scheduled Medications: . antiseptic oral rinse  7 mL Mouth Rinse BID  . aspirin EC  81 mg Oral Daily  . azithromycin  250 mg Oral Daily  . enoxaparin (LOVENOX) injection  40 mg Subcutaneous Q24H  . ferrous gluconate  324 mg Oral BID WC  . furosemide  40 mg Intravenous BID  . guaiFENesin  1,200 mg Oral BID  . ipratropium-albuterol  3 mL Nebulization TID  . lisinopril  5 mg Oral Daily  . metoprolol tartrate  25 mg Oral Q12H  . [START ON 05/03/2016] predniSONE  40 mg Oral Q breakfast  . sodium chloride flush  3 mL Intravenous Q12H    PRN Medications: sodium chloride, acetaminophen, albuterol, ondansetron (ZOFRAN) IV, sodium chloride flush   Past Medical History:  Diagnosis Date  . Cardiomyopathy [facility])    Diagnosed July 2017  . Combined congestive systolic and diastolic heart failure ([place])   . Essential hypertension   . History of bronchitis     Past Surgical History:  Procedure Laterality Date  . ABDOMINAL HYSTERECTOMY    . Barbie Banner OSTEOTOMY Right 07/10/2013   Procedure: Barbie Banner OSTEOTOMY RIGHT FOOT;  Surgeon: Marcheta Grammes, DPM;  Location: [facility];  Service: Orthopedics;  Laterality: Right;  . BUNIONECTOMY Right 07/10/2013   Procedure: VOGLER BUNIONECTOMY RIGHT FOOT;  Surgeon: Marcheta Grammes, DPM;  Location: [facility];  Service: Orthopedics;  Laterality: Right;  . METATARSAL HEAD EXCISION Right 07/10/2013   Procedure: METATARSAL HEAD RESECTION OF DIGITS 2 AND 3 RIGHT FOOT;  Surgeon: Marcheta Grammes, DPM;  Location: [facility];  Service: Orthopedics;  Laterality: Right;  . PROXIMAL INTERPHALANGEAL FUSION (PIP) Right 07/10/2013  Procedure: ARTHRODESIS PIPJ  2ND DIGIT RIGHT FOOT;  Surgeon: Marcheta Grammes, DPM;  Location: [facility];  Service: Orthopedics;  Laterality: Right;    Family History  Problem Relation Age of Onset  . Hypertension Father     Social History Tammy Mcpherson reports that she has been smoking Cigarettes.  She has a 9.25  pack-year smoking history. She has never used smokeless tobacco. Tammy Mcpherson reports that she does not drink alcohol.  Review of Systems Complete review of systems negative except as otherwise outlined in the clinical summary and also the following. Reports mild chronic dyspnea on exertion, episodes of bronchitis.  Physical Examination Blood pressure (!) 114/49, pulse 91, temperature 98.5 F (36.9 C), temperature source Oral, resp. rate 17, height 5\' 5"  (1.651 m), weight 146 lb 4.8 oz (66.4 kg), SpO2 95 %.  Intake/Output Summary (Last 24 hours) at 05/02/16 1538 Last data filed at 05/02/16 1406  Gross per 24 hour  Intake              720 ml  Output             3200 ml  Net            -2480 ml   Telemetry: Sinus rhythm with occasional PVCs, also burst of SVT.  Gen.: Chronically ill-appearing woman in no distress. Hoarse when talking. HEENT: Conjunctiva and lids normal, oropharynx clear with poor dentition. Neck: Supple, mildly elevated JVP, no carotid bruits, no thyromegaly. Lungs: Diminished breath sounds at the bases with scattered crackles in the lower lung zones, no wheezing, nonlabored breathing at rest. Cardiac: Regular rate and rhythm, no S3, soft apical systolic murmur, no pericardial rub. Abdomen: Soft, nontender, bowel sounds present, no guarding or rebound. Extremities: No pitting edema, distal pulses 2+. Skin: Warm and dry. Musculoskeletal: No kyphosis. Neuropsychiatric: Alert and oriented x3, affect grossly appropriate.  Lab Results  Basic Metabolic Panel:  Recent Labs Lab 04/30/16 0719 05/01/16 0619 05/02/16 0522  NA 141 138 139  K 3.7 3.5 4.2  CL 110 104 104  CO2 23 25 26   GLUCOSE 135* 166* 141*  BUN 11 19 30*  CREATININE 0.67 0.64 1.05*  CALCIUM 8.5* 8.8* 9.1    CBC:  Recent Labs Lab 04/30/16 0719 05/01/16 0619 05/02/16 0522  WBC 13.4* 13.3* 29.1*  NEUTROABS 9.8* 12.2*  --   HGB 8.9* 9.2* 9.6*  HCT 29.3* 31.3* 32.9*  MCV 74.6* 74.2* 74.4*  PLT  496* 589* 710*    Cardiac Enzymes:  Recent Labs Lab 04/30/16 0719 04/30/16 1714 04/30/16 2326 05/01/16 0619  TROPONINI 0.03* 0.05* 0.04* <0.03    BNP: 446  ECG I personally reviewed the tracing from 05/01/2016 which showed sinus tachycardia with left atrial enlargement and nonspecific ST changes.  Imaging  Chest CT 04/30/2016: FINDINGS: Cardiovascular: Technically adequate within the upper and mid lungs but extensive respiratory motion artifact in the lower lungs significantly decreases sensitivity. No large or central pulmonary emboli are identified. Cardiomegaly and mild to moderate coronary artery calcifications noted. There is no evidence of thoracic aortic aneurysm. Mediastinum/Nodes: Upper limits normal size mediastinal and hilar lymph nodes have minimally enlarged since 2014. No mediastinal mass or pericardial effusion identified. Lungs/Pleura: Bilateral interstitial thickening identified and likely representing edema. Very small bilateral pleural effusions and bibasilar atelectasis noted. Mild paraseptal emphysema identified. No definite airspace disease, mass or pneumothorax noted. Upper Abdomen: No significant abnormality Musculoskeletal: No acute or suspicious abnormality. Review of the MIP images confirms the above findings. IMPRESSION:  Cardiomegaly with interstitial pulmonary edema and very small bilateral pleural effusions. Mild bibasilar atelectasis. No pulmonary emboli identified, but sub optimal evaluation in the lower lungs. Coronary artery disease.  Echocardiogram 05/01/2016: Study Conclusions  - Left ventricle: The cavity size was moderately dilated. Wall   thickness was increased in a pattern of mild LVH. Systolic   function was mildly to moderately reduced. The estimated ejection   fraction was in the range of 40% to 45%. Diffuse hypokinesis. The   study is not technically sufficient to allow evaluation of LV   diastolic function. Doppler  parameters are consistent with both   elevated ventricular end-diastolic filling pressure and elevated   left atrial filling pressure. - Mitral valve: Mildly thickened leaflets . There was trivial   regurgitation. - Left atrium: The atrium was moderately dilated. - Right atrium: Central venous pressure (est): 3 mm Hg. - Tricuspid valve: There was trivial regurgitation. - Pulmonary arteries: PA peak pressure: 26 mm Hg (S). - Pericardium, extracardiac: There was no pericardial effusion.  Impressions:  - Mild LVH with moderate LV chamber dilatation and LVEF   approximately 40-45%. There is diffuse hypokinesis overall with   indeterminate diastolic function but evidence of increased LV   filling pressure. Moderate left atrial enlargement. Mildly   thickened mitral leaflets with trivial mitral regurgitation.   Trivial tricuspid regurgitation with PASP 26 mmHg.  Impression  1. Newly documented cardiomyopathy with LVEF 40-45%, diffuse hypokinesis, and suspected diastolic dysfunction with increased LV filling pressures. PASP normal range. Presentation is consistent with acute congestive heart failure exacerbation, although duration of this cardiomyopathy is not certain. She is improving with diuresis and concurrent treatment for bronchitis. Cardiac enzymes are not consistent with ACS and her chest pain is atypical.  2. Long-standing history of tobacco abuse with recurrent episodes of bronchitis.  3. History of essential hypertension, not on standing medical therapy as an outpatient.  4. Reported palpitations, no syncope. Telemetry shows occasional PVCs and a burst of SVT.  5. Coronary artery calcifications, mild to moderate. Documented incidentally on chest CT imaging.  Recommendations  Discussed findings with patient. At this time would recommend medical therapy for cardiomyopathy and then establishing outpatient follow-up to discuss symptom control, further medication titration, and a  basic ischemic workup. Continue aspirin, use ARB instead of ACE inhibitor, change Lopressor to bisoprolol (more beta-1 selective). She has diuresed reasonably well so far, approximately 5500 cc. Cut back Lasix to once daily but continue IV formulation. We will also add low-dose Aldactone. Our service will follow with you.  Satira Sark, M.D., F.A.C.C.

## 2016-05-03 ENCOUNTER — Encounter (HOSPITAL_COMMUNITY): Payer: Self-pay | Admitting: Cardiology

## 2016-05-03 DIAGNOSIS — J9601 Acute respiratory failure with hypoxia: Secondary | ICD-10-CM

## 2016-05-03 LAB — BASIC METABOLIC PANEL
Anion gap: 8 (ref 5–15)
BUN: 37 mg/dL — AB (ref 6–20)
CHLORIDE: 102 mmol/L (ref 101–111)
CO2: 30 mmol/L (ref 22–32)
CREATININE: 1.08 mg/dL — AB (ref 0.44–1.00)
Calcium: 8.8 mg/dL — ABNORMAL LOW (ref 8.9–10.3)
GFR calc Af Amer: >60 mL/min (ref >60)
GFR calc non Af Amer: 55 mL/min — ABNORMAL LOW (ref >60)
GLUCOSE: 103 mg/dL — AB (ref 65–99)
Potassium: 3.9 mmol/L (ref 3.5–5.1)
SODIUM: 140 mmol/L (ref 135–145)

## 2016-05-03 LAB — CBC
HEMATOCRIT: 30.9 % — AB (ref 36.0–46.0)
Hemoglobin: 9.3 g/dL — ABNORMAL LOW (ref 12.0–15.0)
MCH: 22.6 pg — AB (ref 26.0–34.0)
MCHC: 30.1 g/dL (ref 30.0–36.0)
MCV: 75 fL — AB (ref 78.0–100.0)
PLATELETS: 567 10*3/uL — AB (ref 150–400)
RBC: 4.12 MIL/uL (ref 3.87–5.11)
RDW: 17.7 % — AB (ref 11.5–15.5)
WBC: 19.2 10*3/uL — ABNORMAL HIGH (ref 4.0–10.5)

## 2016-05-03 MED ORDER — SPIRONOLACTONE 25 MG PO TABS: 12.5000 mg | ORAL_TABLET | Freq: Every day | ORAL | 0 refills | Status: DC

## 2016-05-03 MED ORDER — LOSARTAN POTASSIUM 25 MG PO TABS: 12.5000 mg | ORAL_TABLET | Freq: Every day | ORAL | 0 refills | Status: DC

## 2016-05-03 MED ORDER — AZITHROMYCIN 250 MG PO TABS: 250.0000 mg | ORAL_TABLET | Freq: Every day | ORAL | 0 refills | Status: DC

## 2016-05-03 MED ORDER — FUROSEMIDE 20 MG PO TABS: 20.0000 mg | ORAL_TABLET | Freq: Every day | ORAL | Status: DC

## 2016-05-03 MED ORDER — FERROUS GLUCONATE 324 (38 FE) MG PO TABS: 324.0000 mg | ORAL_TABLET | Freq: Two times a day (BID) | ORAL | Status: DC

## 2016-05-03 MED ORDER — FUROSEMIDE 20 MG PO TABS: 20.0000 mg | ORAL_TABLET | Freq: Every day | ORAL | 0 refills | Status: DC

## 2016-05-03 MED ORDER — BISOPROLOL FUMARATE 5 MG PO TABS: 5.0000 mg | ORAL_TABLET | Freq: Every day | ORAL | 0 refills | Status: DC

## 2016-05-03 MED ORDER — PREDNISONE 10 MG PO TABS: ORAL_TABLET | ORAL | 0 refills | Status: DC

## 2016-05-03 MED ORDER — ALBUTEROL SULFATE HFA 108 (90 BASE) MCG/ACT IN AERS: 2.0000 | INHALATION_SPRAY | Freq: Four times a day (QID) | RESPIRATORY_TRACT | 0 refills | Status: DC | PRN

## 2016-05-03 MED ORDER — ASPIRIN 81 MG PO TBEC: 81.0000 mg | DELAYED_RELEASE_TABLET | Freq: Every day | ORAL | Status: DC

## 2016-05-03 NOTE — Discharge Summary (Signed)
Physician Discharge Summary  Tammy Mcpherson [id number] DOB: 09/23/1956 DOA: 04/30/2016  PCP: Collene Mares, PA-C  Admit date: 04/30/2016 Discharge date: 05/03/2016  Recommendations for Outpatient Follow-up:  1. Follow up Cardiomyopathy, systolic CHF 2. Follow suspected COPD. Consider outpatient PFTs were pulmonology evaluation. 3. Follow-up iron deficiency anemia of unclear etiology. Consider outpatient GI or GYN evaluation. 4. Continue to encourage smoking abstinence.   Follow-up Information    Charlie Pitter, PA-C Follow up on 05/09/2016.   Specialties:  Cardiology, Radiology Why:  at 3:30 pm Contact information: [place] [address] [place], Marland Kitchen, PA-C. Schedule an appointment as soon as possible for a visit today.   Specialties:  Physician Assistant, Internal Medicine Why:  follow up in 1-2 weeks  Contact information: [address] [place] [id number] [phone]          Discharge Diagnoses:  1. Acute respiratory failure with hypoxia 2. Acute systolic CHF 3. COPD exacerbation 4. Elevated troponin, Demand ischemia. 5. Iron deficiency anemia 6. Tobacco use disorder  Discharge Condition: improved Disposition: discharge home  Diet recommendation: heart healthy  Filed Weights   05/01/16 0639 05/02/16 0602 05/03/16 0603  Weight: 65 kg (143 lb 3.2 oz) 66.4 kg (146 lb 4.8 oz) 66.2 kg (145 lb 15.5 oz)    History of present illness:  60 -year-old woman presented with chest pain and shortness of breath. CT chest showed cardiomegaly and pulmonary edema. Admitted for acute respiratory failure with hypoxia and acute systolic CHF. Treated with bronchodilators, steroids. Echocardiogram revealed diffuse hypokinesis. Seen by cardiology with recommendation for medical management at this point and outpatient follow-up.  Hospital Course:  Patient was treated with diuresis with rapid improvement as well as steroids, antibiotics and  bronchodilators for COPD. She was seen by cardiology for new cardiomyopathy with recommendations for medical management and close outpatient follow-up. Condition rapidly improved and hospitalization was uncomplicated. Individual issues as below  1. Acute respiratory failure with hypoxia, secondary to systolic CHF and COPD exacerbation. Resolved. 2. Acute systolic CHF, possibly related to uncontrolled blood pressure. This is a new diagnosis. ECHO revealed systolic CHF with EF of [id number] and diffuse hypokinesis, results as below. She is currently on BB and aspirin. ACE has been transitioned to ARB. Cardiac markers have not indicated ACS. 3. COPD exacerbation. Clinically resolved. Pt has a long history of smoking. Consider outpatient PFTs. 4. Leukocytosis secondary to steroids. Trending downwards. CTA chest negative 7/30. 5. Elevated troponin secondary to demand ischemia.  6. Iron deficiency anemia with microcytosis. No obvious signs of bleeding. Anemia panel indicates iron deficiency anemia. Patient has been started on iron supplements. Further workup can be pursued as an outpatient.  7. Essential HTN. Stable. 8. CAD.  9. Tobacco abuse. Counseled on the importance of cessation. She has expressed a desire to quit smoking.  Consultants:  Cardiology   Procedures:  ECHO Study Conclusions  - Left ventricle: The cavity size was moderately dilated. Wall thickness was increased in a pattern of mild LVH. Systolic function was mildly to moderately reduced. The estimated ejection fraction was in the range of 40% to 45%. Diffuse hypokinesis. The study is not technically sufficient to allow evaluation of LV diastolic function. Doppler parameters are consistent with both elevated ventricular end-diastolic filling pressure and elevated left atrial filling pressure. - Mitral valve: Mildly thickened leaflets . There was trivial regurgitation. - Left atrium: The atrium was moderately  dilated. - Right atrium: Central venous pressure (  est): 3 mm Hg. - Tricuspid valve: There was trivial regurgitation. - Pulmonary arteries: PA peak pressure: 26 mm Hg (S). - Pericardium, extracardiac: There was no pericardial effusion.  Antimicrobials:   Azithromycin 7/30>> 8/3   Discharge Instructions  Discharge Instructions    (HEART FAILURE PATIENTS) Call MD:  Anytime you have any of the following symptoms: 1) 3 pound weight gain in 24 hours or 5 pounds in 1 week 2) shortness of breath, with or without a dry hacking cough 3) swelling in the hands, feet or stomach 4) if you have to sleep on extra pillows at night in order to breathe.    Complete by:  As directed   Diet - low sodium heart healthy    Complete by:  As directed   Discharge instructions    Complete by:  As directed   Call your physician or seek immediate medical attention for swelling, weight gain, shortness of breath, difficulty lying flat, pain or worsening of condition.   Increase activity slowly    Complete by:  As directed       Medication List    STOP taking these medications   BC HEADACHE 325-95-16 MG Tabs Generic drug:  Aspirin-Salicylamide-Caffeine     TAKE these medications   albuterol 108 (90 Base) MCG/ACT inhaler Commonly known as:  PROVENTIL HFA;VENTOLIN HFA Inhale 2 puffs into the lungs every 6 (six) hours as needed for wheezing or shortness of breath. Please instruct in proper technique.   aspirin 81 MG EC tablet Take 1 tablet (81 mg total) by mouth daily.   azithromycin 250 MG tablet Commonly known as:  ZITHROMAX Take 1 tablet (250 mg total) by mouth daily. Take only dose 8/3 in AM.   bisoprolol 5 MG tablet Commonly known as:  ZEBETA Take 1 tablet (5 mg total) by mouth daily.   ferrous gluconate 324 MG tablet Commonly known as:  FERGON Take 1 tablet (324 mg total) by mouth 2 (two) times daily with a meal.   furosemide 20 MG tablet Commonly known as:  LASIX Take 1 tablet (20 mg total) by  mouth daily.   losartan 25 MG tablet Commonly known as:  COZAAR Take 0.5 tablets (12.5 mg total) by mouth daily.   MELATONIN PO Take 1 tablet by mouth daily as needed (sleep).   predniSONE 10 MG tablet Commonly known as:  DELTASONE Take daily by mouth: 40 mg x3 days, then 20 mg x3 days, then 10 mg x3 days, then stop.   spironolactone 25 MG tablet Commonly known as:  ALDACTONE Take 0.5 tablets (12.5 mg total) by mouth daily.      Allergies  Allergen Reactions  . Codeine Anaphylaxis  . Penicillins Swelling and Rash    Has patient had a PCN reaction causing immediate rash, facial/tongue/throat swelling, SOB or lightheadedness with hypotension: yes Has patient had a PCN reaction causing severe rash involving mucus membranes or skin necrosis: No Has patient had a PCN reaction that required hospitalization No Has patient had a PCN reaction occurring within the last 10 years: No If all of the above answers are "NO", then may proceed with Cephalosporin use.   Ebbie Ridge [Pseudoephedrine Hcl] Rash    The results of significant diagnostics from this hospitalization (including imaging, microbiology, ancillary and laboratory) are listed below for reference.    Significant Diagnostic Studies: Dg Chest 2 View  Result Date: 04/30/2016 CLINICAL DATA:  Shortness of Breath worsening last night. Chest pain. EXAM: CHEST  2 VIEW COMPARISON:  10/21/2015 FINDINGS: Bibasilar opacities, likely atelectasis. Heart is normal size. No visible effusions. No acute bony abnormality. IMPRESSION: Bibasilar atelectasis. Electronically Signed   By: Rolm Baptise M.D.   On: 04/30/2016 07:46  Ct Angio Chest Pe W Or Wo Contrast  Result Date: 04/30/2016 CLINICAL DATA:  60 year old female with acute chest pain and shortness of breath for 1 week. EXAM: CT ANGIOGRAPHY CHEST WITH CONTRAST TECHNIQUE: Multidetector CT imaging of the chest was performed using the standard protocol during bolus administration of intravenous  contrast. Multiplanar CT image reconstructions and MIPs were obtained to evaluate the vascular anatomy. CONTRAST:  100 cc intravenous Isovue 370 COMPARISON:  04/30/2016 and prior radiographs.  08/15/2013 chest CT FINDINGS: Cardiovascular: Technically adequate within the upper and mid lungs but extensive respiratory motion artifact in the lower lungs significantly decreases sensitivity. No large or central pulmonary emboli are identified. Cardiomegaly and mild to moderate coronary artery calcifications noted. There is no evidence of thoracic aortic aneurysm. Mediastinum/Nodes: Upper limits normal size mediastinal and hilar lymph nodes have minimally enlarged since 2014. No mediastinal mass or pericardial effusion identified. Lungs/Pleura: Bilateral interstitial thickening identified and likely representing edema. Very small bilateral pleural effusions and bibasilar atelectasis noted. Mild paraseptal emphysema identified. No definite airspace disease, mass or pneumothorax noted. Upper Abdomen: No significant abnormality Musculoskeletal: No acute or suspicious abnormality. Review of the MIP images confirms the above findings. IMPRESSION: Cardiomegaly with interstitial pulmonary edema and very small bilateral pleural effusions. Mild bibasilar atelectasis. No pulmonary emboli identified, but sub optimal evaluation in the lower lungs. Coronary artery disease. Electronically Signed   By: Margarette Canada M.D.   On: 04/30/2016 09:22  Labs: Basic Metabolic Panel:  Recent Labs Lab 04/30/16 0719 05/01/16 0619 05/02/16 0522 05/03/16 0533  NA 141 138 139 140  K 3.7 3.5 4.2 3.9  CL 110 104 104 102  CO2 23 25 26 30   GLUCOSE 135* 166* 141* 103*  BUN 11 19 30* 37*  CREATININE 0.67 0.64 1.05* 1.08*  CALCIUM 8.5* 8.8* 9.1 8.8*   CBC:  Recent Labs Lab 04/30/16 0719 05/01/16 0619 05/02/16 0522 05/03/16 0533  WBC 13.4* 13.3* 29.1* 19.2*  NEUTROABS 9.8* 12.2*  --   --   HGB 8.9* 9.2* 9.6* 9.3*  HCT 29.3* 31.3*  32.9* 30.9*  MCV 74.6* 74.2* 74.4* 75.0*  PLT 496* 589* 710* 567*   Cardiac Enzymes:  Recent Labs Lab 04/30/16 0719 04/30/16 1714 04/30/16 2326 05/01/16 0619  TROPONINI 0.03* 0.05* 0.04* <0.03    Recent Labs  04/30/16 0719  BNP 446.0*    Principal Problem:   Acute respiratory failure with hypoxia (HCC) Active Problems:   Acute combined systolic and diastolic heart failure (HCC)   COPD exacerbation (HCC)   Benign essential HTN   Anemia   Tobacco use disorder   Cardiomyopathy- etiology not yet determined   CAD-Ca++ coronaries on CTA   Elevated troponin   Time coordinating discharge: 35 minutes  Signed:  Murray Hodgkins, MD [facility] 05/03/2016, 6:51 PM   By signing my name below, I, Delene Ruffini, attest that this documentation has been prepared under the direction and in the presence of Anish Vana P. Sarajane Jews, MD. Electronically Signed: Delene Ruffini, Scribe.  05/03/16  I personally performed the services described in this documentation. All medical record entries made by the scribe were at my direction. I have reviewed the chart and agree that the record reflects my personal performance and is accurate and complete. Murray Hodgkins, MD

## 2016-05-03 NOTE — Discharge Instructions (Signed)
Heart Failure  Heart failure means your heart has trouble pumping blood. This makes it hard for your body to work well. Heart failure is usually a long-term (chronic) condition. You must take good care of yourself and follow your doctor's treatment plan.  HOME CARE   Take your heart medicine as told by your doctor.    Do not stop taking medicine unless your doctor tells you to.    Do not skip any dose of medicine.    Refill your medicines before they run out.    Take other medicines only as told by your doctor or pharmacist.   Stay active if told by your doctor. The elderly and people with severe heart failure should talk with a doctor about physical activity.   Eat heart-healthy foods. Choose foods that are without trans fat and are low in saturated fat, cholesterol, and salt (sodium). This includes fresh or frozen fruits and vegetables, fish, lean meats, fat-free or low-fat dairy foods, whole grains, and high-fiber foods. Lentils and dried peas and beans (legumes) are also good choices.   Limit salt if told by your doctor.   Cook in a healthy way. Roast, grill, broil, bake, poach, steam, or stir-fry foods.   Limit fluids as told by your doctor.   Weigh yourself every morning. Do this after you pee (urinate) and before you eat breakfast. Write down your weight to give to your doctor.   Take your blood pressure and write it down if your doctor tells you to.   Ask your doctor how to check your pulse. Check your pulse as told.   Lose weight if told by your doctor.   Stop smoking or chewing tobacco. Do not use gum or patches that help you quit without your doctor's approval.   Schedule and go to doctor visits as told.   Nonpregnant women should have no more than 1 drink a day. Men should have no more than 2 drinks a day. Talk to your doctor about drinking alcohol.   Stop illegal drug use.   Stay current with shots (immunizations).   Manage your health conditions as told by your doctor.   Learn to  manage your stress.   Rest when you are tired.   If it is really hot outside:    Avoid intense activities.    Use air conditioning or fans, or get in a cooler place.    Avoid caffeine and alcohol.    Wear loose-fitting, lightweight, and light-colored clothing.   If it is really cold outside:    Avoid intense activities.    Layer your clothing.    Wear mittens or gloves, a hat, and a scarf when going outside.    Avoid alcohol.   Learn about heart failure and get support as needed.   Get help to maintain or improve your quality of life and your ability to care for yourself as needed.  GET HELP IF:    You gain weight quickly.   You are more short of breath than usual.   You cannot do your normal activities.   You tire easily.   You cough more than normal, especially with activity.   You have any or more puffiness (swelling) in areas such as your hands, feet, ankles, or belly (abdomen).   You cannot sleep because it is hard to breathe.   You feel like your heart is beating fast (palpitations).   You get dizzy or light-headed when you stand up.  GET HELP   RIGHT AWAY IF:    You have trouble breathing.   There is a change in mental status, such as becoming less alert or not being able to focus.   You have chest pain or discomfort.   You faint.  MAKE SURE YOU:    Understand these instructions.   Will watch your condition.   Will get help right away if you are not doing well or get worse.     This information is not intended to replace advice given to you by your health care provider. Make sure you discuss any questions you have with your health care provider.     Document Released: 06/27/2008 Document Revised: 10/09/2014 Document Reviewed: 11/04/2012  Elsevier Interactive Patient Education 2016 Elsevier Inc[employer]

## 2016-05-03 NOTE — Care Management Note (Signed)
Case Management Note  Patient Details  Name: Tammy Mcpherson MRN: [id number] Date of Birth: Apr 19, 1956   Status of Service:  Completed, signed off  If discussed at Long Length of Stay Meetings, dates discussed:    Additional Comments: Patient DC home today with self care. Does not require oxygen set up . Will sign off.  Ethelle Ola, Chauncey Reading, RN 05/03/2016, 2:48 PM

## 2016-05-03 NOTE — Progress Notes (Signed)
PROGRESS NOTE  Tammy Mcpherson [id number] DOB: 12/28/1955 DOA: 04/30/2016 PCP: Collene Mares, PA-C  Brief Narrative: 60 -year-old woman presented with chest pain and shortness of breath. CT chest showed cardiomegaly and pulmonary edema. Admitted for acute respiratory failure with hypoxia and acute systolic CHF. Treated with bronchodilators, steroids. Echocardiogram revealed diffuse hypokinesis. Seen by cardiology with recommendation for medical management at this point an outpatient follow-up.  Assessment/Plan: 1. Acute respiratory failure with hypoxia, secondary to systolic CHF and COPD exacerbation. Resolved. 2. Acute systolic CHF, possibly related to uncontrolled blood pressure. This is a new diagnosis. ECHO revealed systolic CHF with EF of [id number] and diffuse hypokinesis, results as below. She is currently on BB and aspirin. ACE has been transitioned to ARB. Cardiac markers have not indicated ACS. 3. COPD exacerbation. Clinically resolved. Pt has a long history of smoking. Consider outpatient PFTs. 4. Leukocytosis secondary to steroids. Trending downwards. CTA chest negative 7/30. 5. Elevated troponin secondary to demand ischemia.  6. Iron deficiency anemia with microcytosis. No obvious signs of bleeding. Anemia panel indicates iron deficiency anemia. Patient has been started on iron supplements. Further workup can be pursued as an outpatient.  7. Essential HTN. Stable. 8. CAD.  9. Tobacco abuse. Counseled on the importance of cessation. She has expressed a desire to quit smoking.   Doing well. Complete antibiotics, steroid taper as an outpatient. Offer bronchodilator. Consider outpatient pulmonology evaluation.  Agree with cardiology management. Continue bisoprolol, aspirin, Cozaar, Aldactone, Lasix.  Home today   DVT prophylaxis: Lovenox Code Status: Full Family Communication: discussed with patient. No family present Disposition Plan: Anticipate discharge within 1-2 days    Murray Hodgkins, MD  [facility] Direct contact: [phone] --[place] [place]  --[email or web address]; password [email or web address]  7PM-7AM contact night coverage as above 05/03/2016, 9:15 AM  LOS: 3 days   Consultants:   None   Procedures:   ECHO Study Conclusions  - Left ventricle: The cavity size was moderately dilated. Wall thickness was increased in a pattern of mild LVH. Systolic function was mildly to moderately reduced. The estimated ejection fraction was in the range of 40% to 45%. Diffuse hypokinesis. The study is not technically sufficient to allow evaluation of LV diastolic function. Doppler parameters are consistent with both elevated ventricular end-diastolic filling pressure and elevated left atrial filling pressure. - Mitral valve: Mildly thickened leaflets . There was trivial regurgitation. - Left atrium: The atrium was moderately dilated. - Right atrium: Central venous pressure (est): 3 mm Hg. - Tricuspid valve: There was trivial regurgitation. - Pulmonary arteries: PA peak pressure: 26 mm Hg (S). - Pericardium, extracardiac: There was no pericardial effusion.  Antimicrobials:   Azithromycin 7/30>>  HPI/Subjective: Feels well. Reports vomiting after eating, but has no complaints otherwise. No chest pain or wheeze. No difficulty breathing.  Objective: Vitals:   05/02/16 2012 05/02/16 2055 05/03/16 0603 05/03/16 0757  BP:  128/62 132/70   Pulse:  88 87   Resp:  18 18   Temp:  98.4 F (36.9 C) 98.2 F (36.8 C)   TempSrc:  Oral Oral   SpO2: 98% 97% 97% 93%  Weight:   66.2 kg (145 lb 15.5 oz)   Height:        Intake/Output Summary (Last 24 hours) at 05/03/16 0915 Last data filed at 05/03/16 0700  Gross per 24 hour  Intake              480 ml  Output  1950 ml  Net            -1470 ml     Filed Weights   05/01/16 0639 05/02/16 0602 05/03/16 0603  Weight: 65 kg (143 lb 3.2 oz) 66.4 kg (146 lb 4.8 oz) 66.2 kg (145 lb 15.5  oz)    Exam:    Constitutional:  . Appears calm and comfortable Eyes:  . PERRL and irises appear normal . Conjunctivae and lids appear normal ENMT:  . Lips and tongue normal Respiratory:  . Bilateral posterior bibasilar crackles . Respiratory effort normal. No retractions or accessory muscle use Cardiovascular:  . RRR, no m/r/g . No LE extremity edema   Abdomen:  . no tenderness or masses Musculoskeletal:  o Moves all extremities. Psychiatric:  . judgement and insight appear normal . Mental status o Mood, affect appropriate   I have personally reviewed following labs and imaging studies:  BUN 37, Cr 1.08  Glucose 103  WBC 19.2, trending down  Hgb 9.3, stable  Platelets 567, trending down  UOP Minus 450mL over last 24 hours. Minus 6.2L since admission   Scheduled Meds: . antiseptic oral rinse  7 mL Mouth Rinse BID  . aspirin EC  81 mg Oral Daily  . azithromycin  250 mg Oral Daily  . bisoprolol  5 mg Oral Daily  . enoxaparin (LOVENOX) injection  40 mg Subcutaneous Q24H  . ferrous gluconate  324 mg Oral BID WC  . furosemide  40 mg Intravenous Daily  . guaiFENesin  1,200 mg Oral BID  . ipratropium-albuterol  3 mL Nebulization TID  . losartan  12.5 mg Oral Daily  . predniSONE  40 mg Oral Q breakfast  . sodium chloride flush  3 mL Intravenous Q12H  . spironolactone  12.5 mg Oral Daily   Continuous Infusions:   Principal Problem:   Acute respiratory failure with hypoxia (HCC) Active Problems:   Acute combined systolic and diastolic heart failure (HCC)   COPD exacerbation (HCC)   Benign essential HTN   Anemia   Tobacco use disorder   Cardiomyopathy- etiology not yet determined   CAD-Ca++ coronaries on CTA   Elevated troponin   LOS: 3 days   Time spent 25 minutes  By signing my name below, I, Delene Ruffini, attest that this documentation has been prepared under the direction and in the presence of Tanor Glaspy P. Sarajane Jews, MD. Electronically Signed:  Delene Ruffini, Scribe.  05/03/16    I personally performed the services described in this documentation. All medical record entries made by the scribe were at my direction. I have reviewed the chart and agree that the record reflects my personal performance and is accurate and complete. Murray Hodgkins, MD

## 2016-05-03 NOTE — Progress Notes (Signed)
Consulting Cardiologist: Dr. Satira Sark  Subjective:  Pt breathing better, wants to go home  Objective:  Vital Signs in the last 24 hours: Temp:  [98.2 F (36.8 C)-98.5 F (36.9 C)] 98.2 F (36.8 C) (08/02 0603) Pulse Rate:  [87-91] 87 (08/02 0603) Resp:  [18] 18 (08/02 0603) BP: (114-132)/(49-70) 132/70 (08/02 0603) SpO2:  [93 %-98 %] 93 % (08/02 0757) Weight:  [145 lb 15.5 oz (66.2 kg)] 145 lb 15.5 oz (66.2 kg) (08/02 0603)  Intake/Output from previous day:  Intake/Output Summary (Last 24 hours) at 05/03/16 1051 Last data filed at 05/03/16 1041  Gross per 24 hour  Intake              600 ml  Output             1600 ml  Net            -1000 ml    Physical Exam: General appearance: alert, cooperative, appears older than stated age, no distress and mildly obese Neck: no carotid bruit and no JVD Lungs: scattered rhonchi, no wheezing Heart: regular rate and rhythm Abdomen: soft, non-tender; bowel sounds normal; no masses,  no organomegaly Extremities: extremities normal, atraumatic, no cyanosis or edema Skin: Skin color, texture, turgor normal. No rashes or lesions Neurologic: Grossly normal   Rate: 86  Rhythm: normal sinus rhythm  Lab Results:  Recent Labs  05/02/16 0522 05/03/16 0533  WBC 29.1* 19.2*  HGB 9.6* 9.3*  PLT 710* 567*    Recent Labs  05/02/16 0522 05/03/16 0533  NA 139 140  K 4.2 3.9  CL 104 102  CO2 26 30  GLUCOSE 141* 103*  BUN 30* 37*  CREATININE 1.05* 1.08*    Recent Labs  04/30/16 2326 05/01/16 0619  TROPONINI 0.04* <0.03   No results for input(s): INR in the last 72 hours.  Scheduled Meds: . antiseptic oral rinse  7 mL Mouth Rinse BID  . aspirin EC  81 mg Oral Daily  . azithromycin  250 mg Oral Daily  . bisoprolol  5 mg Oral Daily  . enoxaparin (LOVENOX) injection  40 mg Subcutaneous Q24H  . ferrous gluconate  324 mg Oral BID WC  . furosemide  40 mg Intravenous Daily  . guaiFENesin  1,200 mg Oral BID  .  ipratropium-albuterol  3 mL Nebulization TID  . losartan  12.5 mg Oral Daily  . predniSONE  40 mg Oral Q breakfast  . sodium chloride flush  3 mL Intravenous Q12H  . spironolactone  12.5 mg Oral Daily   Continuous Infusions:  PRN Meds:.sodium chloride, acetaminophen, albuterol, ondansetron (ZOFRAN) IV, sodium chloride flush   Imaging: Imaging results have been reviewed  Cardiac Studies: Echo 05/01/16 Impressions:  - Mild LVH with moderate LV chamber dilatation and LVEF   approximately 40-45%. There is diffuse hypokinesis overall with   indeterminate diastolic function but evidence of increased LV   filling pressure. Moderate left atrial enlargement. Mildly   thickened mitral leaflets with trivial mitral regurgitation.   Trivial tricuspid regurgitation with PASP 26 mmHg.  Assessment/Plan:  60 y/o Caucasian female with medical history of bronchitis and HTN (not taking medications) who presented to [facility] 04/30/16 with complaints of orthopnea and worsening SOB associated chest pain described as sharp located in the substernal area and radiates to the left. In the ED, CT angio showed cardiomegaly with pulmonary edema, small bilateral pleural effusions, and CAD. Echo showed an EF of 40-45% with global HK. She  has improved with treatment of her bronchitis, CHF (>5L diuresis) and her HTN.   Principal Problem:   Acute respiratory failure with hypoxia (HCC) Active Problems:   Acute combined systolic and diastolic heart failure (HCC)   COPD exacerbation (HCC)   Cardiomyopathy- etiology not yet determined   Benign essential HTN   Anemia   CAD-Ca++ coronaries on CTA   Elevated troponin   Tobacco use disorder   PLAN: Will review with MD-? OK for discharge with plans for OP Myoview.   Kerin Ransom PA-C 05/03/2016, 10:51 AM [phone]   Attending note:  Patient seen and examined. Discussed with Mr. Reino Bellis. Please refer to my consultation note from yesterday. Patient clinically  improving. Tolerating current medications including aspirin, bisoprolol, Cozaar, Aldactone, and IV Lasix. Has diuresed nearly 7000 cc during hospital stay. Creatinine stable at 1.1. Recommend change to Lasix 20 mg daily and continue other standing cardiac regimen. Patient states that she feels better today, potentially consider discharge home with close outpatient follow-up arranged. We will schedule a visit with APP for one week.  Satira Sark, M.D., F.A.C.C.

## 2016-05-03 NOTE — Progress Notes (Signed)
SATURATION QUALIFICATIONS: (This note is used to comply with regulatory documentation for home oxygen)  Patient Saturations on Room Air at Rest = 95%  Patient Saturations on Room Air while Ambulating = 96% 

## 2016-05-09 ENCOUNTER — Encounter: Payer: Self-pay | Admitting: *Deleted

## 2016-05-09 ENCOUNTER — Encounter: Payer: Self-pay | Admitting: Physician Assistant

## 2016-05-09 ENCOUNTER — Ambulatory Visit (INDEPENDENT_AMBULATORY_CARE_PROVIDER_SITE_OTHER): Payer: BLUE CROSS/BLUE SHIELD | Admitting: Physician Assistant

## 2016-05-09 VITALS — BP 132/78 | HR 88 | Ht 65.0 in | Wt 144.0 lb

## 2016-05-09 DIAGNOSIS — D509 Iron deficiency anemia, unspecified: Secondary | ICD-10-CM

## 2016-05-09 DIAGNOSIS — I5042 Chronic combined systolic (congestive) and diastolic (congestive) heart failure: Secondary | ICD-10-CM | POA: Diagnosis not present

## 2016-05-09 DIAGNOSIS — I471 Supraventricular tachycardia: Secondary | ICD-10-CM | POA: Diagnosis not present

## 2016-05-09 DIAGNOSIS — I2510 Atherosclerotic heart disease of native coronary artery without angina pectoris: Secondary | ICD-10-CM

## 2016-05-09 DIAGNOSIS — R7989 Other specified abnormal findings of blood chemistry: Secondary | ICD-10-CM

## 2016-05-09 DIAGNOSIS — I10 Essential (primary) hypertension: Secondary | ICD-10-CM | POA: Diagnosis not present

## 2016-05-09 DIAGNOSIS — I493 Ventricular premature depolarization: Secondary | ICD-10-CM

## 2016-05-09 MED ORDER — BISOPROLOL FUMARATE 10 MG PO TABS: 10.0000 mg | ORAL_TABLET | Freq: Every day | ORAL | 6 refills | Status: DC

## 2016-05-09 NOTE — Progress Notes (Signed)
Cardiology Office Note    Date:  05/09/2016  ID:  Tammy Mcpherson, DOB [birth year], MRN [id number] PCP:  Collene Mares, PA-C  Cardiologist:  Dr. Domenic Polite  Chief Complaint: f/u CHF  History of Present Illness:  Tammy Mcpherson is a 60 y.o. female with history of HTN, suspected COPD, PVCs/PSVT (by tele 05/2016), tobacco abuse, and recently diagnosed combined CHF with coronary artery calcifications who presents for f/u.  She was admitted for acute hypoxic respiratory failure, accelerated HTN, new onset heart failure, & suspected AECOPD 04/30/16 with echo 05/01/16 showing mild LVH, EF 40-45%, diffuse HK, elevated LVEDP, mod LAE. CT angio r/o PE but showed mild-mod coronary calcifications. Telemetry showed occasional PVCs and a burst of SVT. She was treated with diuresis and placed on CHF regimen to include ARB, spironolactone, BB, Lasix. Labwork was notable for newly recognized anemia - Hgb 8.9-9.6 (prev 16 in 2014), thrombocytosis which appeared similar to 2014, and leukocytosis felt perhaps due to steroids, although also elevated in 08/2013. Trop peak 0.05, question demand ischemia. Her iron indices were felt to be consistent with iron deficiency and she was started on iron.  She returns back today for follow-up. Her SOB is better. She continues to have atypical chest discomfort, sometimes more than once a day, lasting up to 15 mins at a time. It is made exquisitely worse with even light palpation of the anterior chest wall and also worse lying on her left side. She can have it with exertion or with rest. She also feels it go to certain spots on her back at times. She denies any LEE. She denies prior h/o anemia. She states that several months ago she was seeing blood in her stool about twice a week, mostly on the toilet paper. She has not had prior workup for this.  Past Medical History:  Diagnosis Date  . Anemia 04/30/2016  . Cardiomyopathy [facility])    Diagnosed July 2017  . Combined congestive systolic and  diastolic heart failure ([place])   . COPD (chronic obstructive pulmonary disease) ([place])    a. suspected COPD.  Marland Kitchen Coronary artery calcification seen on CT scan   . Essential hypertension   . History of bronchitis   . PSVT (paroxysmal supraventricular tachycardia) ([place])    a. first noted on tele during adm 7-05/2016.  Marland Kitchen PVC's (premature ventricular contractions)    a. first noted on tele during adm 7-05/2016.  Marland Kitchen Thrombocytosis ([place])     Past Surgical History:  Procedure Laterality Date  . ABDOMINAL HYSTERECTOMY    . Barbie Banner OSTEOTOMY Right 07/10/2013   Procedure: Barbie Banner OSTEOTOMY RIGHT FOOT;  Surgeon: Marcheta Grammes, DPM;  Location: [facility];  Service: Orthopedics;  Laterality: Right;  . BUNIONECTOMY Right 07/10/2013   Procedure: VOGLER BUNIONECTOMY RIGHT FOOT;  Surgeon: Marcheta Grammes, DPM;  Location: [facility];  Service: Orthopedics;  Laterality: Right;  . METATARSAL HEAD EXCISION Right 07/10/2013   Procedure: METATARSAL HEAD RESECTION OF DIGITS 2 AND 3 RIGHT FOOT;  Surgeon: Marcheta Grammes, DPM;  Location: [facility];  Service: Orthopedics;  Laterality: Right;  . PROXIMAL INTERPHALANGEAL FUSION (PIP) Right 07/10/2013   Procedure: ARTHRODESIS PIPJ  2ND DIGIT RIGHT FOOT;  Surgeon: Marcheta Grammes, DPM;  Location: [facility];  Service: Orthopedics;  Laterality: Right;    Current Medications: Outpatient Medications Prior to Visit  Medication Sig Dispense Refill  . albuterol (PROVENTIL HFA;VENTOLIN HFA) 108 (90 Base) MCG/ACT inhaler Inhale 2 puffs into the lungs every 6 (six) hours as needed  for wheezing or shortness of breath. Please instruct in proper technique. 1 Inhaler 0  . aspirin EC 81 MG EC tablet Take 1 tablet (81 mg total) by mouth daily.    . bisoprolol (ZEBETA) 5 MG tablet Take 1 tablet (5 mg total) by mouth daily. 30 tablet 0  . ferrous gluconate (FERGON) 324 MG tablet Take 1 tablet (324 mg total) by mouth 2 (two) times daily with a meal.    . furosemide (LASIX) 20 MG  tablet Take 1 tablet (20 mg total) by mouth daily. 30 tablet 0  . losartan (COZAAR) 25 MG tablet Take 0.5 tablets (12.5 mg total) by mouth daily. 30 tablet 0  . predniSONE (DELTASONE) 10 MG tablet Take daily by mouth: 40 mg x3 days, then 20 mg x3 days, then 10 mg x3 days, then stop. 21 tablet 0  . spironolactone (ALDACTONE) 25 MG tablet Take 0.5 tablets (12.5 mg total) by mouth daily. 30 tablet 0  . azithromycin (ZITHROMAX) 250 MG tablet Take 1 tablet (250 mg total) by mouth daily. Take only dose 8/3 in AM. 1 tablet 0  . MELATONIN PO Take 1 tablet by mouth daily as needed (sleep).     No facility-administered medications prior to visit.      Allergies:   Codeine; Penicillins; and Sudafed [pseudoephedrine hcl]   Social History   Social History  . Marital status: Married    Spouse name: N/A  . Number of children: N/A  . Years of education: N/A   Social History Main Topics  . Smoking status: Current Every Day Smoker    Packs/day: 0.25    Years: 37.00    Types: Cigarettes    Start date: 05/10/1975  . Smokeless tobacco: Never Used  . Alcohol use No  . Drug use: No  . Sexual activity: Yes    Birth control/ protection: Surgical   Other Topics Concern  . None   Social History Narrative  . None     Family History:  The patient's family history includes Coronary artery disease in her sister; Hypertension in her father.   ROS:   Please see the history of present illness. All other systems are reviewed and otherwise negative.    PHYSICAL EXAM:   VS:  BP 132/78   Pulse 88   Ht 5\' 5"  (1.651 m)   Wt 144 lb (65.3 kg)   SpO2 97%   BMI 23.96 kg/m   BMI: Body mass index is 23.96 kg/m. GEN: Well nourished, well developed WF, in no acute distress  HEENT: normocephalic, atraumatic Neck: no JVD, carotid bruits, or masses Cardiac: RRR; no murmurs, rubs, or gallops, no edema. Left anterior chest wall TTP even with placement of stethoscope -"that's it, that's the pain" pt  states Respiratory:  clear to auscultation bilaterally, normal work of breathing GI: soft, nontender, nondistended, + BS MS: no deformity or atrophy  Skin: warm and dry, no rash Neuro:  Alert and Oriented x 3, Strength and sensation are intact, follows commands Psych: euthymic mood, full affect  Wt Readings from Last 3 Encounters:  05/09/16 144 lb (65.3 kg)  05/03/16 145 lb 15.5 oz (66.2 kg)  08/15/13 132 lb (59.9 kg)      Studies/Labs Reviewed:   EKG:  EKG was ordered today and personally reviewed by me and demonstrates NSR 85bpm, nonspecific ST-T changes in setting of LVH with ST sagging in I, II, avL, V6. EKG from 7/30 has lots of artifact and is difficult to compare but  EKG in 2014 also had nonspecific ST-T changes.  Recent Labs: 04/30/2016: B Natriuretic Peptide 446.0; TSH 1.727 05/03/2016: BUN 37; Creatinine, Ser 1.08; Hemoglobin 9.3; Platelets 567; Potassium 3.9; Sodium 140   Lipid Panel No results found for: CHOL, TRIG, HDL, CHOLHDL, VLDL, LDLCALC, LDLDIRECT  Additional studies/ records that were reviewed today include: Summarized above.    ASSESSMENT & PLAN:   1. Recently diagnosed chronic combined CHF with cardiomyopathy (etiology TBD) - she appears euvolemic. Recheck BMET today. Continue BB, ARB, spironolactone, Lasix for now. See below re: further w/u. 2. Coronary calcification on CT with atypical chest pain - difficult situation given newly diagnosed anemia. Given her LV dysfunction ideally would proceed with LHC for definitive evaluation in light of atypical CP and cardiac risk factors.  However, with history of rectal bleeding, she is not a great candidate until further workup by GI. I had discussed the case with Dr. Harl Bowie who also came to speak with the patient. He recommends nuclear stress test to risk stratify, and further titration of antianginal regimen with increase in bisoprolol to 10mg  daily. I know she's not fasting but we have no assessment of lipids on file  for her - her PO status may only affect triglycerides this PM so will add lipid panel this afternoon along with baseline LFTs. Warning sx reviewed with patient. Continue aspirin as tolerated. 3. Essential HTN - follow with med change above. 4. PSVT/PVCs - quiescent by exam and symptoms. 5. Abnormal CBC - she does give a hx of BRBPR several months ago that was never worked up. She hasn't noticed any recently. Will recheck CBC today and refer to GI for further evaluation. She is also trying to establish care with her PCP.  Disposition: F/u with Dr. Domenic Polite after nuc or APP on a day when Domenic Polite is here. A work note was given to remain out of work until [employer]. 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  Concerns regarding medicines are outlined above. Medication changes, Labs and Tests ordered today are summarized above and listed in the Patient Instructions accessible in Encounters.   Signed, Melina Copa PA-C  05/09/2016 3:50 PM    [facility] [place], [place] [address] Ph: [phone]; Fax [phone]

## 2016-05-09 NOTE — Patient Instructions (Signed)
Your physician recommends that you schedule a follow-up appointment after stress test.   Your physician has recommended you make the following change in your medication:   Increase Bisoprolol to 10 mg Daily ( May use up 5 mg Tablets)   You have been given a note for work   Your physician recommends that you have lab work done today.   If you need a refill on your cardiac medications before your next appointment, please call your pharmacy.  Thank you for choosing [facility]!

## 2016-05-10 ENCOUNTER — Encounter: Payer: Self-pay | Admitting: Gastroenterology

## 2016-05-10 LAB — CBC WITH DIFFERENTIAL/PLATELET
Basophils Absolute: 0 cells/uL (ref 0–200)
Basophils Relative: 0 %
EOS ABS: 259 {cells}/uL (ref 15–500)
Eosinophils Relative: 1 %
HEMATOCRIT: 37.2 % (ref 35.0–45.0)
Hemoglobin: 11 g/dL — ABNORMAL LOW (ref 11.7–15.5)
LYMPHS PCT: 26 %
Lymphs Abs: 6734 cells/uL — ABNORMAL HIGH (ref 850–3900)
MCH: 21.7 pg — ABNORMAL LOW (ref 27.0–33.0)
MCHC: 29.6 g/dL — AB (ref 32.0–36.0)
MCV: 73.4 fL — AB (ref 80.0–100.0)
MONO ABS: 2331 {cells}/uL — AB (ref 200–950)
MONOS PCT: 9 %
MPV: 9.6 fL (ref 7.5–12.5)
NEUTROS PCT: 64 %
Neutro Abs: 16576 cells/uL — ABNORMAL HIGH (ref 1500–7800)
PLATELETS: 802 10*3/uL — AB (ref 140–400)
RBC: 5.07 MIL/uL (ref 3.80–5.10)
RDW: 18.4 % — AB (ref 11.0–15.0)
WBC: 25.9 10*3/uL — AB (ref 3.8–10.8)

## 2016-05-11 ENCOUNTER — Telehealth: Payer: Self-pay

## 2016-05-11 DIAGNOSIS — D473 Essential (hemorrhagic) thrombocythemia: Secondary | ICD-10-CM

## 2016-05-11 DIAGNOSIS — D75839 Thrombocytosis, unspecified: Secondary | ICD-10-CM

## 2016-05-11 DIAGNOSIS — D72829 Elevated white blood cell count, unspecified: Secondary | ICD-10-CM

## 2016-05-11 LAB — COMPREHENSIVE METABOLIC PANEL
ALBUMIN: 3.7 g/dL (ref 3.6–5.1)
ALT: 12 U/L (ref 6–29)
AST: 10 U/L (ref 10–35)
Alkaline Phosphatase: 69 U/L (ref 33–130)
BILIRUBIN TOTAL: 0.2 mg/dL (ref 0.2–1.2)
BUN: 18 mg/dL (ref 7–25)
CHLORIDE: 98 mmol/L (ref 98–110)
CO2: 24 mmol/L (ref 20–31)
CREATININE: 0.92 mg/dL (ref 0.50–1.05)
Calcium: 9.3 mg/dL (ref 8.6–10.4)
Glucose, Bld: 76 mg/dL (ref 65–99)
Potassium: 5 mmol/L (ref 3.5–5.3)
SODIUM: 136 mmol/L (ref 135–146)
TOTAL PROTEIN: 6.5 g/dL (ref 6.1–8.1)

## 2016-05-11 LAB — LIPID PANEL
Cholesterol: 209 mg/dL — ABNORMAL HIGH (ref 125–200)
HDL: 50 mg/dL (ref >=46)
LDL CALC: 121 mg/dL (ref <130)
Total CHOL/HDL Ratio: 4.2 Ratio (ref <=5.0)
Triglycerides: 188 mg/dL — ABNORMAL HIGH (ref <150)
VLDL: 38 mg/dL — ABNORMAL HIGH (ref <30)

## 2016-05-11 LAB — PATHOLOGIST SMEAR REVIEW

## 2016-05-11 NOTE — Telephone Encounter (Signed)
Lmtcb, placed ref to hematology-cc

## 2016-05-11 NOTE — Telephone Encounter (Signed)
-----   Message from Charlie Pitter, [place] sent at 05/11/2016  9:20 AM EDT ----- Please see reply from Dr. Harl Bowie - "Significantly elevated WBCs and platelets, you are right apepars to have been elevated a week or so ago while in hospital. In absence of infectious symptoms, and given the duration I would be worried about a primary hematological issues. Since she does not have a pcp can we refer her to Heme/onc at [facility] to evaluate, earliest available.   Zandra Abts MD"  Please arrange this referal ASAP. Otherwise will continue plan as discussed. She probably needs to start a statin but I think we should defer until after workup of the abnormal blood count so we do not confuse the picture. If she is experiencing any signs of infection she needs to proceed to hospital. Melina Copa PA-C

## 2016-05-12 ENCOUNTER — Other Ambulatory Visit (HOSPITAL_COMMUNITY): Payer: Self-pay | Admitting: Oncology

## 2016-05-12 ENCOUNTER — Encounter (HOSPITAL_COMMUNITY): Payer: Self-pay | Admitting: Oncology

## 2016-05-12 DIAGNOSIS — D649 Anemia, unspecified: Secondary | ICD-10-CM

## 2016-05-12 DIAGNOSIS — R1032 Left lower quadrant pain: Secondary | ICD-10-CM | POA: Insufficient documentation

## 2016-05-12 DIAGNOSIS — D473 Essential (hemorrhagic) thrombocythemia: Secondary | ICD-10-CM

## 2016-05-12 DIAGNOSIS — D75839 Thrombocytosis, unspecified: Secondary | ICD-10-CM

## 2016-05-12 DIAGNOSIS — D509 Iron deficiency anemia, unspecified: Secondary | ICD-10-CM

## 2016-05-12 DIAGNOSIS — R109 Unspecified abdominal pain: Secondary | ICD-10-CM | POA: Insufficient documentation

## 2016-05-12 HISTORY — DX: Iron deficiency anemia, unspecified: D50.9

## 2016-05-18 ENCOUNTER — Encounter (HOSPITAL_COMMUNITY): Payer: BLUE CROSS/BLUE SHIELD | Attending: Oncology

## 2016-05-18 VITALS — BP 128/54 | HR 74 | Temp 97.8°F | Resp 18

## 2016-05-18 DIAGNOSIS — D509 Iron deficiency anemia, unspecified: Secondary | ICD-10-CM | POA: Diagnosis not present

## 2016-05-18 DIAGNOSIS — D473 Essential (hemorrhagic) thrombocythemia: Secondary | ICD-10-CM | POA: Insufficient documentation

## 2016-05-18 DIAGNOSIS — D649 Anemia, unspecified: Secondary | ICD-10-CM | POA: Insufficient documentation

## 2016-05-18 MED ORDER — SODIUM CHLORIDE 0.9 % IV SOLN
510.0000 mg | Freq: Once | INTRAVENOUS | Status: AC
  Administered 2016-05-18: 510 mg via INTRAVENOUS
  Filled 2016-05-18: qty 17

## 2016-05-18 MED ORDER — SODIUM CHLORIDE 0.9 % IV SOLN
Freq: Once | INTRAVENOUS | Status: AC
  Administered 2016-05-18: 14:00:00 via INTRAVENOUS

## 2016-05-18 NOTE — Patient Instructions (Signed)
[facility] at [facility] Discharge Instructions  RECOMMENDATIONS MADE BY THE CONSULTANT AND ANY TEST RESULTS WILL BE SENT TO YOUR REFERRING PHYSICIAN.  Received Feraheme today. Follow-up as scheduled. Call clinic for any questions or concerns  Thank you for choosing [facility] at [facility] to provide your oncology and hematology care.  To afford each patient quality time with our provider, please arrive at least 15 minutes before your scheduled appointment time.   Beginning January 23rd 2017 lab work for the Cancer Center will be done in the  Main lab at [facility] on 1st floor. If you have a lab appointment with the Cancer Center please come in thru the  Main Entrance and check in at the main information desk  You need to re-schedule your appointment should you arrive 10 or more minutes late.  We strive to give you quality time with our providers, and arriving late affects you and other patients whose appointments are after yours.  Also, if you no show three or more times for appointments you may be dismissed from the clinic at the providers discretion.     Again, thank you for choosing [facility].  Our hope is that these requests will decrease the amount of time that you wait before being seen by our physicians.       _____________________________________________________________  Should you have questions after your visit to [facility], please contact our office at [phone] between the hours of 8:30 a.m. and 4:30 p.m.  Voicemails left after 4:30 p.m. will not be returned until the following business day.  For prescription refill requests, have your pharmacy contact our office.         Resources For Cancer Patients and their Caregivers ? American Cancer Society: Can assist with transportation, wigs, general needs, runs Look Good Feel Better.        1-888-227-6333 ? Cancer Care: Provides financial  assistance, online support groups, medication/co-pay assistance.  1-800-813-HOPE (4673) ? [facility] Assists [place] cancer patients and their families through emotional , educational and financial support.  336-427-4357 ? [place] DSS Where to apply for food stamps, Medicaid and utility assistance. 336-342-1394 ? [facility]: Transportation to medical appointments. 336-347-2287 ? Social Security Administration: May apply for disability if have a Stage IV cancer. 336-342-7796 1-800-772-1213 ? [place] Aging, Disability and Transit Services: Assists with nutrition, care and transit needs. 336-349-2343  Cancer Center Support Programs: @10RELATIVEDAYS@ > Cancer Support Group  2nd Tuesday of the month 1pm-2pm, Journey Room  > Creative Journey  3rd Tuesday of the month 1130am-1pm, Journey Room  > Look Good Feel Better  1st Wednesday of the month 10am-12 noon, Journey Room (Call American Cancer Society to register 1-800-395-5775)   

## 2016-05-18 NOTE — Progress Notes (Signed)
Tammy Mcpherson tolerated Feraheme infusion well. Pt did experience a little nausea which passed quickly. Pt instructed in purpose and side effects of Feraheme and understanding was verbalized. Pt discharged self ambulatory in satisfactory condition with husband

## 2016-05-19 ENCOUNTER — Encounter (HOSPITAL_COMMUNITY)
Admission: RE | Admit: 2016-05-19 | Discharge: 2016-05-19 | Disposition: A | Discharge status: Home / Self Care | Payer: PRIVATE HEALTH INSURANCE | Source: Ambulatory Visit | Attending: Physician Assistant | Admitting: Physician Assistant

## 2016-05-19 ENCOUNTER — Inpatient Hospital Stay (HOSPITAL_COMMUNITY): Admission: RE | Admit: 2016-05-19 | Payer: PRIVATE HEALTH INSURANCE | Source: Ambulatory Visit

## 2016-05-19 ENCOUNTER — Encounter (HOSPITAL_COMMUNITY): Payer: Self-pay

## 2016-05-19 DIAGNOSIS — I471 Supraventricular tachycardia: Secondary | ICD-10-CM | POA: Diagnosis not present

## 2016-05-19 DIAGNOSIS — I493 Ventricular premature depolarization: Secondary | ICD-10-CM | POA: Diagnosis not present

## 2016-05-19 DIAGNOSIS — I5042 Chronic combined systolic (congestive) and diastolic (congestive) heart failure: Secondary | ICD-10-CM

## 2016-05-19 DIAGNOSIS — R7989 Other specified abnormal findings of blood chemistry: Secondary | ICD-10-CM | POA: Diagnosis not present

## 2016-05-19 DIAGNOSIS — I2510 Atherosclerotic heart disease of native coronary artery without angina pectoris: Secondary | ICD-10-CM | POA: Diagnosis not present

## 2016-05-19 DIAGNOSIS — I10 Essential (primary) hypertension: Secondary | ICD-10-CM

## 2016-05-19 LAB — NM MYOCAR MULTI W/SPECT W/WALL MOTION / EF
CHL CUP NUCLEAR SDS: 0
CHL CUP NUCLEAR SSS: 5
LHR: 0
LV dias vol: 175 mL (ref 46–106)
LV sys vol: 155 mL
Peak HR: 95 {beats}/min
Rest HR: 74 {beats}/min
SRS: 5
TID: 0.92

## 2016-05-19 MED ORDER — REGADENOSON 0.4 MG/5ML IV SOLN
INTRAVENOUS | Status: AC
  Administered 2016-05-19: 0.4 mg via INTRAVENOUS
  Filled 2016-05-19: qty 5

## 2016-05-19 MED ORDER — SODIUM CHLORIDE 0.9% FLUSH
INTRAVENOUS | Status: AC
Start: 2016-05-19 — End: 2016-05-19
  Administered 2016-05-19: 10 mL via INTRAVENOUS
  Filled 2016-05-19: qty 10

## 2016-05-19 MED ORDER — TECHNETIUM TC 99M TETROFOSMIN IV KIT
10.0000 | PACK | Freq: Once | INTRAVENOUS | Status: AC | PRN
  Administered 2016-05-19: 11 via INTRAVENOUS

## 2016-05-19 MED ORDER — TECHNETIUM TC 99M TETROFOSMIN IV KIT
30.0000 | PACK | Freq: Once | INTRAVENOUS | Status: AC | PRN
  Administered 2016-05-19: 30 via INTRAVENOUS

## 2016-05-22 ENCOUNTER — Telehealth: Payer: Self-pay | Admitting: Cardiovascular Disease

## 2016-05-22 NOTE — Telephone Encounter (Signed)
Tammy Mcpherson is calling to get her Stress test results , please call   Thanks

## 2016-05-23 ENCOUNTER — Ambulatory Visit (INDEPENDENT_AMBULATORY_CARE_PROVIDER_SITE_OTHER): Payer: PRIVATE HEALTH INSURANCE | Admitting: Adult Health

## 2016-05-23 ENCOUNTER — Encounter: Payer: Self-pay | Admitting: *Deleted

## 2016-05-23 ENCOUNTER — Encounter: Payer: Self-pay | Admitting: Adult Health

## 2016-05-23 VITALS — BP 150/80 | HR 78 | Ht 65.0 in | Wt 147.0 lb

## 2016-05-23 DIAGNOSIS — I420 Dilated cardiomyopathy: Secondary | ICD-10-CM

## 2016-05-23 DIAGNOSIS — Z720 Tobacco use: Secondary | ICD-10-CM

## 2016-05-23 DIAGNOSIS — I10 Essential (primary) hypertension: Secondary | ICD-10-CM

## 2016-05-23 DIAGNOSIS — I429 Cardiomyopathy, unspecified: Secondary | ICD-10-CM

## 2016-05-23 DIAGNOSIS — Z79899 Other long term (current) drug therapy: Secondary | ICD-10-CM

## 2016-05-23 MED ORDER — LOSARTAN POTASSIUM 25 MG PO TABS: 25.0000 mg | ORAL_TABLET | Freq: Every day | ORAL | 3 refills | Status: DC

## 2016-05-23 NOTE — Progress Notes (Signed)
Name: Tammy Mcpherson    DOB: Sep 17, 1956  Age: 60 y.o.  MR#: [id number]       PCP:  [place]: Payor: [employer]IC COMMERCIAL / Plan: GENERIC COMMERCIAL / Product Type: *No Product type* /   CC:   No chief complaint on file.   VS Vitals:   05/23/16 1349  BP: (!) 150/80  Pulse: 78  SpO2: 95%  Weight: 147 lb (66.7 kg)  Height: 5\' 5"  (1.651 m)    Weights Current Weight  05/23/16 147 lb (66.7 kg)  05/09/16 144 lb (65.3 kg)  05/03/16 145 lb 15.5 oz (66.2 kg)    Blood Pressure  BP Readings from Last 3 Encounters:  05/23/16 (!) 150/80  05/18/16 (!) 128/54  05/09/16 132/78     Admit date:  (Not on file) Last encounter with RMR:  Visit date not found   Allergy Codeine; Penicillins; Bee venom; and Sudafed [pseudoephedrine hcl]  Current Outpatient Prescriptions  Medication Sig Dispense Refill  . albuterol (PROVENTIL HFA;VENTOLIN HFA) 108 (90 Base) MCG/ACT inhaler Inhale 2 puffs into the lungs every 6 (six) hours as needed for wheezing or shortness of breath. Please instruct in proper technique. 1 Inhaler 0  . aspirin EC 81 MG EC tablet Take 1 tablet (81 mg total) by mouth daily.    . bisoprolol (ZEBETA) 10 MG tablet Take 1 tablet (10 mg total) by mouth daily. 30 tablet 6  . ferrous gluconate (FERGON) 324 MG tablet Take 1 tablet (324 mg total) by mouth 2 (two) times daily with a meal.    . furosemide (LASIX) 20 MG tablet Take 1 tablet (20 mg total) by mouth daily. 30 tablet 0  . losartan (COZAAR) 25 MG tablet Take 0.5 tablets (12.5 mg total) by mouth daily. 30 tablet 0  . spironolactone (ALDACTONE) 25 MG tablet Take 0.5 tablets (12.5 mg total) by mouth daily. 30 tablet 0   No current facility-administered medications for this visit.     Discontinued Meds:    Medications Discontinued During This Encounter  Medication Reason  . predniSONE (DELTASONE) 10 MG tablet Error    Patient Active Problem List   Diagnosis Date Noted  . Iron deficiency  anemia 05/12/2016  . Cardiomyopathy- etiology not yet determined 05/02/2016  . CAD-Ca++ coronaries on CTA 05/02/2016  . Elevated troponin 05/02/2016  . Acute combined systolic and diastolic heart failure ([place]) 04/30/2016  . Acute respiratory failure with hypoxia ([place]) 04/30/2016  . COPD exacerbation ([place]) 04/30/2016  . Essential hypertension 04/30/2016  . Anemia 04/30/2016  . Tobacco use disorder 04/30/2016    LABS    Component Value Date/Time   NA 136 05/09/2016 1027   NA 140 05/03/2016 0533   NA 139 05/02/2016 0522   K 5.0 05/09/2016 1027   K 3.9 05/03/2016 0533   K 4.2 05/02/2016 0522   CL 98 05/09/2016 1027   CL 102 05/03/2016 0533   CL 104 05/02/2016 0522   CO2 24 05/09/2016 1027   CO2 30 05/03/2016 0533   CO2 26 05/02/2016 0522   GLUCOSE 76 05/09/2016 1027   GLUCOSE 103 (H) 05/03/2016 0533   GLUCOSE 141 (H) 05/02/2016 0522   BUN 18 05/09/2016 1027   BUN 37 (H) 05/03/2016 0533   BUN 30 (H) 05/02/2016 0522   CREATININE 0.92 05/09/2016 1027   CREATININE 1.08 (H) 05/03/2016 0533   CREATININE 1.05 (H) 05/02/2016 0522   CREATININE 0.64 05/01/2016 0619   CALCIUM 9.3 05/09/2016  1027   CALCIUM 8.8 (L) 05/03/2016 0533   CALCIUM 9.1 05/02/2016 0522   GFRNONAA 55 (L) 05/03/2016 0533   GFRNONAA 57 (L) 05/02/2016 0522   GFRNONAA >60 05/01/2016 0619   GFRAA >60 05/03/2016 0533   GFRAA >60 05/02/2016 0522   GFRAA >60 05/01/2016 0619   CMP     Component Value Date/Time   NA 136 05/09/2016 1027   K 5.0 05/09/2016 1027   CL 98 05/09/2016 1027   CO2 24 05/09/2016 1027   GLUCOSE 76 05/09/2016 1027   BUN 18 05/09/2016 1027   CREATININE 0.92 05/09/2016 1027   CALCIUM 9.3 05/09/2016 1027   PROT 6.5 05/09/2016 1027   ALBUMIN 3.7 05/09/2016 1027   AST 10 05/09/2016 1027   ALT 12 05/09/2016 1027   ALKPHOS 69 05/09/2016 1027   BILITOT 0.2 05/09/2016 1027   GFRNONAA 55 (L) 05/03/2016 0533   GFRAA >60 05/03/2016 0533       Component Value Date/Time   WBC 25.9 (H)  05/09/2016 1027   WBC 19.2 (H) 05/03/2016 0533   WBC 29.1 (H) 05/02/2016 0522   HGB 11.0 (L) 05/09/2016 1027   HGB 9.3 (L) 05/03/2016 0533   HGB 9.6 (L) 05/02/2016 0522   HCT 37.2 05/09/2016 1027   HCT 30.9 (L) 05/03/2016 0533   HCT 32.9 (L) 05/02/2016 0522   MCV 73.4 (L) 05/09/2016 1027   MCV 75.0 (L) 05/03/2016 0533   MCV 74.4 (L) 05/02/2016 0522    Lipid Panel     Component Value Date/Time   CHOL 209 (H) 05/09/2016 1027   TRIG 188 (H) 05/09/2016 1027   HDL 50 05/09/2016 1027   CHOLHDL 4.2 05/09/2016 1027   VLDL 38 (H) 05/09/2016 1027   LDLCALC 121 05/09/2016 1027    ABG No results found for: PHART, PCO2ART, PO2ART, HCO3, TCO2, ACIDBASEDEF, O2SAT   Lab Results  Component Value Date   TSH 1.727 04/30/2016   BNP (last 3 results)  Recent Labs  04/30/16 0719  BNP 446.0*    ProBNP (last 3 results) No results for input(s): PROBNP in the last 8760 hours.  Cardiac Panel (last 3 results) No results for input(s): CKTOTAL, CKMB, TROPONINI, RELINDX in the last 72 hours.  Iron/TIBC/Ferritin/ %Sat    Component Value Date/Time   IRON 14 (L) 04/30/2016 1714   TIBC 511 (H) 04/30/2016 1714   FERRITIN 7 (L) 04/30/2016 1714   IRONPCTSAT 3 (L) 04/30/2016 1714     EKG Orders placed or performed in visit on 05/09/16  . EKG 12-Lead     Prior Assessment and Plan Problem List as of 05/23/2016 Reviewed: 05/09/2016  3:50 PM by Charlie Pitter, PA-C     Cardiovascular and Mediastinum   Acute combined systolic and diastolic heart failure ([place])   Essential hypertension   Cardiomyopathy- etiology not yet determined   CAD-Ca++ coronaries on CTA     Respiratory   Acute respiratory failure with hypoxia (HCC)   COPD exacerbation (HCC)     Other   Anemia   Tobacco use disorder   Elevated troponin   Iron deficiency anemia       Imaging: Dg Chest 2 View  Result Date: 04/30/2016 CLINICAL DATA:  Shortness of Breath worsening last night. Chest pain. EXAM: CHEST  2 VIEW COMPARISON:   10/21/2015 FINDINGS: Bibasilar opacities, likely atelectasis. Heart is normal size. No visible effusions. No acute bony abnormality. IMPRESSION: Bibasilar atelectasis. Electronically Signed   By: Rolm Baptise M.D.   On: 04/30/2016  07:46  Ct Angio Chest Pe W Or Wo Contrast  Result Date: 04/30/2016 CLINICAL DATA:  60 year old female with acute chest pain and shortness of breath for 1 week. EXAM: CT ANGIOGRAPHY CHEST WITH CONTRAST TECHNIQUE: Multidetector CT imaging of the chest was performed using the standard protocol during bolus administration of intravenous contrast. Multiplanar CT image reconstructions and MIPs were obtained to evaluate the vascular anatomy. CONTRAST:  100 cc intravenous Isovue 370 COMPARISON:  04/30/2016 and prior radiographs.  08/15/2013 chest CT FINDINGS: Cardiovascular: Technically adequate within the upper and mid lungs but extensive respiratory motion artifact in the lower lungs significantly decreases sensitivity. No large or central pulmonary emboli are identified. Cardiomegaly and mild to moderate coronary artery calcifications noted. There is no evidence of thoracic aortic aneurysm. Mediastinum/Nodes: Upper limits normal size mediastinal and hilar lymph nodes have minimally enlarged since 2014. No mediastinal mass or pericardial effusion identified. Lungs/Pleura: Bilateral interstitial thickening identified and likely representing edema. Very small bilateral pleural effusions and bibasilar atelectasis noted. Mild paraseptal emphysema identified. No definite airspace disease, mass or pneumothorax noted. Upper Abdomen: No significant abnormality Musculoskeletal: No acute or suspicious abnormality. Review of the MIP images confirms the above findings. IMPRESSION: Cardiomegaly with interstitial pulmonary edema and very small bilateral pleural effusions. Mild bibasilar atelectasis. No pulmonary emboli identified, but sub optimal evaluation in the lower lungs. Coronary artery disease.  Electronically Signed   By: Margarette Canada M.D.   On: 04/30/2016 09:22  Nm Myocar Multi W/spect W/wall Motion / Ef  Result Date: 05/19/2016  Upsloping ST segment depression ST segment depression was noted during stress in the II, III, aVF, V4, V5 and V6 leads. Overall nonspecific EKG changes.  The left ventricular ejection fraction is severely decreased (<30%).  The study is normal. There are no perfusion defects consistent with prior infarct or current ischemia  This is a high risk study. High risk based on decreased LVEF. There is no myocardium currently at jeopardy.

## 2016-05-23 NOTE — Patient Instructions (Signed)
Your physician recommends that you schedule a follow-up appointment in: 1 Week.   Your physician has recommended you make the following change in your medication: Increase Cozaar to 25 mg Daily.  Your physician recommends that you return for lab work on Friday (BMET)   You have been given a letter to stay out of work until your next visit.   If you need a refill on your cardiac medications before your next appointment, please call your pharmacy.  Thank you for choosing [facility]!

## 2016-05-23 NOTE — Progress Notes (Signed)
Cardiology Office Note   Date:  05/23/2016   ID:  Sami, Farinas December 12, 1955, MRN [id number]  PCP:  [place]  Cardiologist: [place]/  Jory Sims, NP   No chief complaint on file.     History of Present Illness: Tammy Mcpherson is a 60 y.o. female who presents for ongoing assessment and follow up of hypertension, PSVT/PVC's , CAD, and CHF. On last appointment, she was being seen for hospital follow up after admission for decompensated CHF, respiratory failure, and accelerated hypertension. She continued to have chest pain, reproducible with palpation, movement onto right side, with and without exertion. She was sent for stress test for diagnostic prognostic purposes. CT angio r/o PE but showed mild-mod coronary calcifications. Telemetry showed occasional PVCs and a burst of SVT. She was treated with diuresis and placed on CHF regimen to include ARB, spironolactone, BB, Lasix.   Upsloping ST segment depression ST segment depression was noted during stress in the II, III, aVF, V4, V5 and V6 leads. Overall nonspecific EKG changes.  The left ventricular ejection fraction is severely decreased (<30%).  The study is normal. There are no perfusion defects consistent with prior infarct or current ischemia  This is a high risk study. High risk based on decreased LVEF. There is no myocardium currently at jeopardy.  Echocardiogram 05/01/2016 Left ventricle: The cavity size was moderately dilated. Wall   thickness was increased in a pattern of mild LVH. Systolic   function was mildly to moderately reduced. The estimated ejection   fraction was in the range of 40% to 45%. Diffuse hypokinesis. The   study is not technically sufficient to allow evaluation of LV   diastolic function. Doppler parameters are consistent with both   elevated ventricular end-diastolic filling pressure and elevated   left atrial filling pressure. - Mitral valve: Mildly thickened leaflets .  There was trivial   regurgitation. - Left atrium: The atrium was moderately dilated. - Right atrium: Central venous pressure (est): 3 mm Hg. - Tricuspid valve: There was trivial regurgitation. - Pulmonary arteries: PA peak pressure: 26 mm Hg (S). - Pericardium, extracardiac: There was no pericardial effusion.  She is without complaint today. She continues to have some mild fatigue but this is gotten a lot better. She has cut down on her smoking from a pack a day to 4-5 cigarettes a day. She is anxious to return to work. She has concerns that she may need to have a pacemaker or an ICD. She also worries that her heart function is too weak to get better.  She denies any symptoms of chest pain dizziness palpitations or dyspnea.  Past Medical History:  Diagnosis Date  . Anemia 04/30/2016  . Cardiomyopathy [facility])    Diagnosed July 2017  . Combined congestive systolic and diastolic heart failure ([place])   . COPD (chronic obstructive pulmonary disease) ([place])    a. suspected COPD.  Marland Kitchen Coronary artery calcification seen on CT scan   . Essential hypertension   . History of bronchitis   . Iron deficiency anemia 05/12/2016  . PSVT (paroxysmal supraventricular tachycardia) ([place])    a. first noted on tele during adm 7-05/2016.  Marland Kitchen PVC's (premature ventricular contractions)    a. first noted on tele during adm 7-05/2016.  Marland Kitchen Thrombocytosis ([place])     Past Surgical History:  Procedure Laterality Date  . ABDOMINAL HYSTERECTOMY    . Barbie Banner OSTEOTOMY Right 07/10/2013   Procedure: Barbie Banner OSTEOTOMY RIGHT FOOT;  Surgeon: Marcheta Grammes,  DPM;  Location: [facility];  Service: Orthopedics;  Laterality: Right;  . BUNIONECTOMY Right 07/10/2013   Procedure: VOGLER BUNIONECTOMY RIGHT FOOT;  Surgeon: Marcheta Grammes, DPM;  Location: [facility];  Service: Orthopedics;  Laterality: Right;  . METATARSAL HEAD EXCISION Right 07/10/2013   Procedure: METATARSAL HEAD RESECTION OF DIGITS 2 AND 3 RIGHT FOOT;  Surgeon: Marcheta Grammes, DPM;  Location: [facility];  Service: Orthopedics;  Laterality: Right;  . PROXIMAL INTERPHALANGEAL FUSION (PIP) Right 07/10/2013   Procedure: ARTHRODESIS PIPJ  2ND DIGIT RIGHT FOOT;  Surgeon: Marcheta Grammes, DPM;  Location: [facility];  Service: Orthopedics;  Laterality: Right;     Current Outpatient Prescriptions  Medication Sig Dispense Refill  . albuterol (PROVENTIL HFA;VENTOLIN HFA) 108 (90 Base) MCG/ACT inhaler Inhale 2 puffs into the lungs every 6 (six) hours as needed for wheezing or shortness of breath. Please instruct in proper technique. 1 Inhaler 0  . aspirin EC 81 MG EC tablet Take 1 tablet (81 mg total) by mouth daily.    . bisoprolol (ZEBETA) 10 MG tablet Take 1 tablet (10 mg total) by mouth daily. 30 tablet 6  . ferrous gluconate (FERGON) 324 MG tablet Take 1 tablet (324 mg total) by mouth 2 (two) times daily with a meal.    . furosemide (LASIX) 20 MG tablet Take 1 tablet (20 mg total) by mouth daily. 30 tablet 0  . spironolactone (ALDACTONE) 25 MG tablet Take 0.5 tablets (12.5 mg total) by mouth daily. 30 tablet 0  . losartan (COZAAR) 25 MG tablet Take 1 tablet (25 mg total) by mouth daily. 90 tablet 3   No current facility-administered medications for this visit.     Allergies:   Codeine; Penicillins; Bee venom; and Sudafed [pseudoephedrine hcl]    Social History:  The patient  reports that she has been smoking Cigarettes.  She started smoking about 41 years ago. She has a 9.25 pack-year smoking history. She has never used smokeless tobacco. She reports that she does not drink alcohol or use drugs.   Family History:  The patient's family history includes Coronary artery disease in her sister; Hypertension in her father.    ROS: All other systems are reviewed and negative. Unless otherwise mentioned in H&P    PHYSICAL EXAM: VS:  BP (!) 150/80   Pulse 78   Ht 5\' 5"  (1.651 m)   Wt 147 lb (66.7 kg)   SpO2 95%   BMI 24.46 kg/m  , BMI Body mass index is  24.46 kg/m. GEN: Well nourished, well developed, in no acute distress  HEENT: normal  Neck: no JVD, carotid bruits, or masses Cardiac: RRR; 1/6 systolic murmur, rubs, or gallops,no edema  Respiratory: Clear to auscultation bilaterally, normal work of breathing, soft bibasilar crackles cleared with cough GI: soft, nontender, nondistended, + BS MS: no deformity or atrophy  Skin: warm and dry, no rash Neuro:  Strength and sensation are intact Psych: euthymic mood, full affect  Recent Labs: 04/30/2016: B Natriuretic Peptide 446.0; TSH 1.727 05/09/2016: ALT 12; BUN 18; Creat 0.92; Hemoglobin 11.0; Platelets 802; Potassium 5.0; Sodium 136    Lipid Panel    Component Value Date/Time   CHOL 209 (H) 05/09/2016 1027   TRIG 188 (H) 05/09/2016 1027   HDL 50 05/09/2016 1027   CHOLHDL 4.2 05/09/2016 1027   VLDL 38 (H) 05/09/2016 1027   LDLCALC 121 05/09/2016 1027      Wt Readings from Last 3 Encounters:  05/23/16 147 lb (66.7  kg)  05/09/16 144 lb (65.3 kg)  05/03/16 145 lb 15.5 oz (66.2 kg)     ASSESSMENT AND PLAN:  1. Nonischemic cardiomypathy: Reviewed stress MPI, echocardiogram, and discharge summaries. The patient is essentially asymptomatic but has multiple concerns about her overall heart function and recovery. I explained to her that it is not indicated for have a pacemaker or any type of defibrillation device. She is given reassurance that it is possible for her heart function to return with optimal medical therapy and blood pressure control.  I discussed this with Dr. Domenic Polite to be clear that she does not need a catheterization. As she has no evidence of ischemia on stress test we will not plan any kind of further invasive testing at this time. We will optimize her medications by increasing her Cozaar to 25 mg daily from 12.5 mg daily. We'll need to get her blood pressure better controlled with this medication in the setting of reduced systolic function. She continues on bisoprolol.  Consideration for changing to carvedilol on next appointment will be discussed. The patient will keep a blood pressure record at home and she does have a machine and write down her recordings to bring to next appointment. We'll see her again in a week with the BMET. To evaluate renal function on diuretics and increased dose of ARB. She is currently not a candidate Entresto.   2. Hypertension: Blood pressure is elevated on this office visit at 150/80. This is not optimal for her chronic systolic dysfunction. As stated above will increase Cozaar to 25 mg daily and reevaluate in one week. At that point we may be able to let her return to work. A letter is provided for her today to notify employers that we will see her again in a week and we'll release her at that time if her blood pressure is well-controlled.  3. Mixed systolic and diastolic CHF: This is chronic for her. Most recent echocardiogram revealing an EF of [id number], and diastolic dysfunction. Continue diuretics along with spironolactone. Checking BMET this week.  Current medicines are reviewed at length with the patient today.    Labs/ tests ordered today include:   Orders Placed This Encounter  Procedures  . Basic Metabolic Panel (BMET)     Disposition:   FU with One week   Signed, Jory Sims, NP  05/23/2016 3:18 PM    [facility] [address], [place], [place] [address] Phone: [phone]; Fax: [phone]

## 2016-05-26 ENCOUNTER — Other Ambulatory Visit (HOSPITAL_COMMUNITY)
Admission: RE | Admit: 2016-05-26 | Discharge: 2016-05-26 | Disposition: A | Discharge status: Home / Self Care | Payer: PRIVATE HEALTH INSURANCE | Source: Ambulatory Visit | Attending: Adult Health | Admitting: Adult Health

## 2016-05-26 DIAGNOSIS — Z79899 Other long term (current) drug therapy: Secondary | ICD-10-CM | POA: Insufficient documentation

## 2016-05-26 LAB — BASIC METABOLIC PANEL
ANION GAP: 10 (ref 5–15)
BUN: 14 mg/dL (ref 6–20)
CALCIUM: 9.4 mg/dL (ref 8.9–10.3)
CO2: 23 mmol/L (ref 22–32)
Chloride: 104 mmol/L (ref 101–111)
Creatinine, Ser: 0.89 mg/dL (ref 0.44–1.00)
GFR calc Af Amer: >60 mL/min (ref >60)
GFR calc non Af Amer: >60 mL/min (ref >60)
GLUCOSE: 140 mg/dL — AB (ref 65–99)
POTASSIUM: 4.1 mmol/L (ref 3.5–5.1)
Sodium: 137 mmol/L (ref 135–145)

## 2016-05-30 ENCOUNTER — Telehealth: Payer: Self-pay | Admitting: Adult Health

## 2016-05-30 NOTE — Telephone Encounter (Signed)
Pt made aware of lab results. Copy to pcp.  

## 2016-05-31 ENCOUNTER — Encounter (HOSPITAL_COMMUNITY): Payer: BLUE CROSS/BLUE SHIELD

## 2016-05-31 ENCOUNTER — Encounter (HOSPITAL_BASED_OUTPATIENT_CLINIC_OR_DEPARTMENT_OTHER): Payer: BLUE CROSS/BLUE SHIELD | Admitting: Hematology

## 2016-05-31 ENCOUNTER — Encounter (HOSPITAL_COMMUNITY): Payer: Self-pay | Admitting: Hematology

## 2016-05-31 VITALS — BP 175/70 | HR 73 | Temp 98.2°F | Resp 20 | Ht 65.0 in | Wt 147.0 lb

## 2016-05-31 DIAGNOSIS — Z720 Tobacco use: Secondary | ICD-10-CM

## 2016-05-31 DIAGNOSIS — R1084 Generalized abdominal pain: Secondary | ICD-10-CM | POA: Diagnosis not present

## 2016-05-31 DIAGNOSIS — D473 Essential (hemorrhagic) thrombocythemia: Secondary | ICD-10-CM

## 2016-05-31 DIAGNOSIS — D72829 Elevated white blood cell count, unspecified: Secondary | ICD-10-CM

## 2016-05-31 DIAGNOSIS — D696 Thrombocytopenia, unspecified: Secondary | ICD-10-CM | POA: Diagnosis not present

## 2016-05-31 DIAGNOSIS — D509 Iron deficiency anemia, unspecified: Secondary | ICD-10-CM | POA: Diagnosis not present

## 2016-05-31 DIAGNOSIS — D649 Anemia, unspecified: Secondary | ICD-10-CM | POA: Diagnosis not present

## 2016-05-31 DIAGNOSIS — D75839 Thrombocytosis, unspecified: Secondary | ICD-10-CM

## 2016-05-31 LAB — IRON AND TIBC
Iron: 92 ug/dL (ref 28–170)
SATURATION RATIOS: 21 % (ref 10.4–31.8)
TIBC: 441 ug/dL (ref 250–450)
UIBC: 349 ug/dL

## 2016-05-31 LAB — CBC WITH DIFFERENTIAL/PLATELET
Basophils Absolute: 0 10*3/uL (ref 0.0–0.1)
Basophils Relative: 0 %
EOS ABS: 0.2 10*3/uL (ref 0.0–0.7)
EOS PCT: 3 %
HCT: 41.5 % (ref 36.0–46.0)
HEMOGLOBIN: 13 g/dL (ref 12.0–15.0)
LYMPHS ABS: 2.8 10*3/uL (ref 0.7–4.0)
LYMPHS PCT: 30 %
MCH: 25.3 pg — AB (ref 26.0–34.0)
MCHC: 31.3 g/dL (ref 30.0–36.0)
MCV: 80.9 fL (ref 78.0–100.0)
MONOS PCT: 14 %
Monocytes Absolute: 1.3 10*3/uL — ABNORMAL HIGH (ref 0.1–1.0)
NEUTROS PCT: 53 %
Neutro Abs: 4.9 10*3/uL (ref 1.7–7.7)
Platelets: 384 10*3/uL (ref 150–400)
RBC: 5.13 MIL/uL — AB (ref 3.87–5.11)
RDW: 25.2 % — ABNORMAL HIGH (ref 11.5–15.5)
WBC: 9.3 10*3/uL (ref 4.0–10.5)

## 2016-05-31 LAB — VITAMIN B12: VITAMIN B 12: 326 pg/mL (ref 180–914)

## 2016-05-31 LAB — COMPREHENSIVE METABOLIC PANEL
ALK PHOS: 81 U/L (ref 38–126)
ALT: 21 U/L (ref 14–54)
ANION GAP: 6 (ref 5–15)
AST: 23 U/L (ref 15–41)
Albumin: 3.8 g/dL (ref 3.5–5.0)
BUN: 14 mg/dL (ref 6–20)
CALCIUM: 8.9 mg/dL (ref 8.9–10.3)
CO2: 24 mmol/L (ref 22–32)
CREATININE: 0.94 mg/dL (ref 0.44–1.00)
Chloride: 104 mmol/L (ref 101–111)
Glucose, Bld: 152 mg/dL — ABNORMAL HIGH (ref 65–99)
Potassium: 4 mmol/L (ref 3.5–5.1)
SODIUM: 134 mmol/L — AB (ref 135–145)
TOTAL PROTEIN: 7.2 g/dL (ref 6.5–8.1)
Total Bilirubin: 0.2 mg/dL — ABNORMAL LOW (ref 0.3–1.2)

## 2016-05-31 LAB — RETICULOCYTES
RBC.: 5.13 MIL/uL — ABNORMAL HIGH (ref 3.87–5.11)
RETIC CT PCT: 1.6 % (ref 0.4–3.1)
Retic Count, Absolute: 82.1 10*3/uL (ref 19.0–186.0)

## 2016-05-31 LAB — LACTATE DEHYDROGENASE: LDH: 134 U/L (ref 98–192)

## 2016-05-31 LAB — FERRITIN: FERRITIN: 242 ng/mL (ref 11–307)

## 2016-05-31 LAB — SEDIMENTATION RATE: SED RATE: 12 mm/h (ref 0–22)

## 2016-05-31 LAB — FOLATE: Folate: 23.3 ng/mL (ref >5.9)

## 2016-05-31 NOTE — Progress Notes (Signed)
Marland Kitchen    HEMATOLOGY/ONCOLOGY CONSULTATION NOTE  Date of Service: 05/31/2016  Patient Care Team: [facility] as PCP - General (Family Medicine) Danie Binder, MD as Consulting Physician (Gastroenterology)  CHIEF COMPLAINTS/PURPOSE OF CONSULTATION:  Leukocytosis, thrombocytosis and iron deficiency anemia  HISTORY OF PRESENTING ILLNESS:   Tammy HOLTHAUS is a wonderful 60 y.o. female who has been referred to [place] by Dr Collene Mares PA-C.[place]  for evaluation and management of leucocytosis, thrombocytosis and iron deficiency anemia.  Patient has a history of Likely COPD, newly diagnosed systolic and diastolic CHF, hypertension who was recently admitted to [facility] on 04/30/2016 with chest pain and shortness of breath angina CTA of the chest which showed cardiomegaly and pulmonary edema without any acute infiltrates. Further workup showed newly noted acute diastolic and systolic CHF diffuse hypokinesis. Patient was treated with bronchodilators, steroids and diuretics. Her CHF management was optimized. She continues to smoke cigarettes at this time.  During the hospitalization the patient was noted to have leukocytosis as high as 29k  with predominantly neutrophilia and some mild borderline lymphocytosis of 6.7k with associated thrombocytosis of as high as 800k. Patient was on high-dose steroids which she has been tapered off of now. She was also noted to be iron deficient with a ferritin of 7 and a hemoglobin in the low 9 range with MCV of around 73 .patient received 1 dose of IV Feraheme in the hospital.   Patient is here for a posthospitalization follow-up to further workup her leukocytosis/thrombocytosis and treatment of her iron deficiency.  Patient's hemoglobin has improved to 13 with an MCV of 80 . Her leukocytosis has resolved to 9.3 k with normal neutrophils and lymphocyte count suggesting that her leukocytosis was reactive likely  related to high-dose steroids and the stress of CHF exacerbation . Part of her leukocytosis could have also been from her ongoing smoking. Patient was also noted to have thrombocytosis of 800 k which has now resolved with a platelet count today of 384 k this suggests against a clonal thrombocytosis and appears to likely be related to her physiologic stressors and iron deficiency .  Patient notes no overt evidence of GI bleeding or hematuria or other source of overt blood loss . She notes that she had some blood on tissue paper a few months ago and feels that she might have hemorrhoids . She does note tenderness to palpation in the central abdomen and in the flanks which she cannot explain . Notes that she has some constipation but has been moving her bowels well with MiraLAX .   no significant weight loss in the last 6 months . Currently smoking 3-4 cigarettes a day down to 1 pack per day before admission . She has follow-up with cardiology with her primary care physician and with gastroenterology .   MEDICAL HISTORY:  Past Medical History:  Diagnosis Date  . Anemia 04/30/2016  . Cardiomyopathy [facility])    Diagnosed July 2017  . Combined congestive systolic and diastolic heart failure ([place])   . COPD (chronic obstructive pulmonary disease) ([place])    a. suspected COPD.  Marland Kitchen Coronary artery calcification seen on CT scan   . Essential hypertension   . History of bronchitis   . Iron deficiency anemia 05/12/2016  . PSVT (paroxysmal supraventricular tachycardia) ([place])    a. first noted on tele during adm 7-05/2016.  Marland Kitchen PVC's (premature ventricular contractions)    a. first noted on tele during adm 7-05/2016.  Marland Kitchen  Thrombocytosis ([place])     SURGICAL HISTORY: Past Surgical History:  Procedure Laterality Date  . ABDOMINAL HYSTERECTOMY    . Barbie Banner OSTEOTOMY Right 07/10/2013   Procedure: Barbie Banner OSTEOTOMY RIGHT FOOT;  Surgeon: Marcheta Grammes, DPM;  Location: [facility];  Service: Orthopedics;  Laterality:  Right;  . BUNIONECTOMY Right 07/10/2013   Procedure: VOGLER BUNIONECTOMY RIGHT FOOT;  Surgeon: Marcheta Grammes, DPM;  Location: [facility];  Service: Orthopedics;  Laterality: Right;  . METATARSAL HEAD EXCISION Right 07/10/2013   Procedure: METATARSAL HEAD RESECTION OF DIGITS 2 AND 3 RIGHT FOOT;  Surgeon: Marcheta Grammes, DPM;  Location: [facility];  Service: Orthopedics;  Laterality: Right;  . PROXIMAL INTERPHALANGEAL FUSION (PIP) Right 07/10/2013   Procedure: ARTHRODESIS PIPJ  2ND DIGIT RIGHT FOOT;  Surgeon: Marcheta Grammes, DPM;  Location: [facility];  Service: Orthopedics;  Laterality: Right;    SOCIAL HISTORY: Social History   Social History  . Marital status: Married    Spouse name: N/A  . Number of children: N/A  . Years of education: N/A   Occupational History  . Not on file.   Social History Main Topics  . Smoking status: Current Every Day Smoker    Packs/day: 0.25    Years: 37.00    Types: Cigarettes    Start date: 05/10/1975  . Smokeless tobacco: Never Used  . Alcohol use No  . Drug use: No  . Sexual activity: Yes    Birth control/ protection: Surgical   Other Topics Concern  . Not on file   Social History Narrative  . No narrative on file    FAMILY HISTORY: Family History  Problem Relation Age of Onset  . Hypertension Father   . Coronary artery disease Sister     ALLERGIES:  is allergic to codeine; penicillins; bee venom; and sudafed [pseudoephedrine hcl].  MEDICATIONS:  Current Outpatient Prescriptions  Medication Sig Dispense Refill  . albuterol (PROVENTIL HFA;VENTOLIN HFA) 108 (90 Base) MCG/ACT inhaler Inhale 2 puffs into the lungs every 6 (six) hours as needed for wheezing or shortness of breath. Please instruct in proper technique. 1 Inhaler 0  . aspirin EC 81 MG EC tablet Take 1 tablet (81 mg total) by mouth daily.    . bisoprolol (ZEBETA) 10 MG tablet Take 1 tablet (10 mg total) by mouth daily. 30 tablet 6  . ferrous gluconate (FERGON) 324  MG tablet Take 1 tablet (324 mg total) by mouth 2 (two) times daily with a meal.    . furosemide (LASIX) 20 MG tablet Take 1 tablet (20 mg total) by mouth daily. 30 tablet 0  . losartan (COZAAR) 25 MG tablet Take 1 tablet (25 mg total) by mouth daily. 90 tablet 3  . spironolactone (ALDACTONE) 25 MG tablet Take 0.5 tablets (12.5 mg total) by mouth daily. 30 tablet 0   No current facility-administered medications for this visit.     REVIEW OF SYSTEMS:    10 Point review of Systems was done is negative except as noted above.  PHYSICAL EXAMINATION: ECOG PERFORMANCE STATUS: 2 - Symptomatic, <50% confined to bed  . Vitals:   05/31/16 0937  BP: (!) 175/70  Pulse: 73  Resp: 20  Temp: 98.2 F (36.8 C)   Filed Weights   05/31/16 0937  Weight: 147 lb (66.7 kg)   .Body mass index is 24.46 kg/m.  GENERAL:alert, in no acute distress and comfortable SKIN: skin color, texture, turgor are normal, no rashes or significant lesions EYES: normal, conjunctiva are  pink and non-injected, sclera clear OROPHARYNX:no exudate, no erythema and lips, buccal mucosa, and tongue normal  NECK: supple, no JVD, thyroid normal size, non-tender, without nodularity LYMPH:  no palpable lymphadenopathy in the cervical, axillary or inguinal Decreased air entry bilaterally, no rales or rhonchi HEART: regular rate & rhythm,  no murmurs and no lower extremity edema ABDOMEN:  normoactive bowel sounds ,TTPin the central abdomen and flanks with no rigidity or rebound, no palpable hepatosplenomegaly noted  Musculoskeletal: no cyanosis of digits and no clubbing  PSYCH: alert & oriented x 3 with fluent speech NEURO: no focal motor/sensory deficits  LABORATORY DATA:  I have reviewed the data as listed  . CBC Latest Ref Rng & Units 05/31/2016 05/09/2016 05/03/2016  WBC 4.0 - 10.5 K/uL 9.3 25.9(H) 19.2(H)  Hemoglobin 12.0 - 15.0 g/dL 13.0 11.0(L) 9.3(L)  Hematocrit 36.0 - 46.0 % 41.5 37.2 30.9(L)  Platelets 150 - 400 K/uL  384 802(H) 567(H)    . CMP Latest Ref Rng & Units 05/31/2016 05/26/2016 05/09/2016  Glucose 65 - 99 mg/dL 152(H) 140(H) 76  BUN 6 - 20 mg/dL 14 14 18   Creatinine 0.44 - 1.00 mg/dL 0.94 0.89 0.92  Sodium 135 - 145 mmol/L 134(L) 137 136  Potassium 3.5 - 5.1 mmol/L 4.0 4.1 5.0  Chloride 101 - 111 mmol/L 104 104 98  CO2 22 - 32 mmol/L 24 23 24   Calcium 8.9 - 10.3 mg/dL 8.9 9.4 9.3  Total Protein 6.5 - 8.1 g/dL 7.2 - 6.5  Total Bilirubin 0.3 - 1.2 mg/dL 0.2(L) - 0.2  Alkaline Phos 38 - 126 U/L 81 - 69  AST 15 - 41 U/L 23 - 10  ALT 14 - 54 U/L 21 - 12     RADIOGRAPHIC STUDIES: I have personally reviewed the radiological images as listed and agreed with the findings in the report. Nm Myocar Multi W/spect W/wall Motion / Ef  Result Date: 05/19/2016  Upsloping ST segment depression ST segment depression was noted during stress in the II, III, aVF, V4, V5 and V6 leads. Overall nonspecific EKG changes.  The left ventricular ejection fraction is severely decreased (<30%).  The study is normal. There are no perfusion defects consistent with prior infarct or current ischemia  This is a high risk study. High risk based on decreased LVEF. There is no myocardium currently at jeopardy.    CT ANGIOGRAPHY CHEST WITH CONTRAST TECHNIQUE: Multidetector CT imaging of the chest was performed using the standard protocol during bolus administration of intravenous contrast. Multiplanar CT image reconstructions and MIPs were obtained to evaluate the vascular anatomy. CONTRAST:  100 cc intravenous Isovue 370 COMPARISON:  04/30/2016 and prior radiographs.  08/15/2013 chest CT FINDINGS: Cardiovascular: Technically adequate within the upper and mid lungs but extensive respiratory motion artifact in the lower lungs significantly decreases sensitivity. No large or central pulmonary emboli are identified. Cardiomegaly and mild to moderate coronary artery calcifications noted. There is no evidence of thoracic  aortic aneurysm. Mediastinum/Nodes: Upper limits normal size mediastinal and hilar lymph nodes have minimally enlarged since 2014. No mediastinal mass or pericardial effusion identified. Lungs/Pleura: Bilateral interstitial thickening identified and likely representing edema. Very small bilateral pleural effusions and bibasilar atelectasis noted. Mild paraseptal emphysema identified. No definite airspace disease, mass or pneumothorax noted. Upper Abdomen: No significant abnormality Musculoskeletal: No acute or suspicious abnormality. Review of the MIP images confirms the above findings. IMPRESSION: Cardiomegaly with interstitial pulmonary edema and very small bilateral pleural effusions. Mild bibasilar atelectasis. No pulmonary emboli identified, but  sub optimal evaluation in the lower lungs. Coronary artery disease. Electronically Signed   By: Margarette Canada M.D.   On: 04/30/2016 09:22   ASSESSMENT & PLAN:   60 year old female with   #1 Leucocytosis up as high as 29k with predominantly neutrophilia and mild lymphocytosis. Appears to be likely related to high-dose steroid use and physiologic stressors from her recent admission: Newly diagnosed systolic CHF. Repeat labs today shows resolution of leukocytosis -strongly suggesting that this is not a clonal process. CTA of the chest did not show lung cancer. She has identified abdominal pain and iron deficiency that needs to be worked up to rule out any evidence of colon cancer or other tumors that could cause paraneoplastic leukocytosis or thrombocytosis is less likely given the normalization. Leukocytosis but also related to ongoing smoking  #2 thrombocytosis -as high as 800 k . Likely reactive has normalized and labs today . could have been from physiologic stressors or her iron deficiency anemia.  #3 iron deficiency - unclear etiology . Will need a gastrointestinal workup . Has a follow-up with a gastric oncologist as outpatient on  06/14/2016 . #4 Poorly localized abdominal pain with some tenderness to palpation .   Plan -Lab results including normalization of WBC counts and platelets reviewed with the patient today. -She received 1 dose of IV Feraheme in the hospital we will set her up for her second dose as outpatient to correct her iron deficiency completely. -She should follow-up with gastroenterology for an outpatient GI workup with EGD and colonoscopy. -CT of the abdomen to workup her abdominal pain especially in the setting of iron deficiency . -Given normalization of the patient's leukocytosis and thrombocytosis less likely myeloproliferative neoplasm. Would hold off on clonal studies including BCR ABL and Jak2 V617F at this time.  RTC with Dr Whitney Muse in 6-8 weeks with CBC with diff, CMP , ferritin and Iron profile.   All of the patients questions were answered with apparent satisfaction. The patient knows to call the clinic with any problems, questions or concerns.  I spent 60 minutes counseling the patient face to face. The total time spent in the appointment was 60 minutes and more than 50% was on counseling and direct patient cares.    Sullivan Lone MD [place] AAHIVMS [facility] Hematology/Oncology Physician [facility]  (Office):       [phone] (Work cell):  [phone] (Fax):           [phone]  05/31/2016 9:57 AM

## 2016-05-31 NOTE — Patient Instructions (Signed)
[place] at [facility]  Discharge Instructions:  CT scan in 1-2 weeks  IV iron in 1 week  Follow up with Dr. Whitney Muse in 6 weeks.   _______________________________________________________________  Thank you for choosing [place] at [facility] to provide your oncology and hematology care.  To afford each patient quality time with our providers, please arrive at least 15 minutes before your scheduled appointment.  You need to re-schedule your appointment if you arrive 10 or more minutes late.  We strive to give you quality time with our providers, and arriving late affects you and other patients whose appointments are after yours.  Also, if you no show three or more times for appointments you may be dismissed from the clinic.  Again, thank you for choosing [place] at [place] hope is that these requests will allow you access to exceptional care and in a timely manner. _______________________________________________________________  If you have questions after your visit, please contact our office at [phone] between the hours of 8:30 a.m. and 5:00 p.m. Voicemails left after 4:30 p.m. will not be returned until the following business day. _______________________________________________________________  For prescription refill requests, have your pharmacy contact our office. _______________________________________________________________  Recommendations made by the consultant and any test results will be sent to your referring physician. _______________________________________________________________

## 2016-06-01 LAB — ERYTHROPOIETIN: Erythropoietin: 8.2 m[IU]/mL (ref 2.6–18.5)

## 2016-06-06 ENCOUNTER — Encounter: Payer: Self-pay | Admitting: Adult Health

## 2016-06-06 ENCOUNTER — Ambulatory Visit (INDEPENDENT_AMBULATORY_CARE_PROVIDER_SITE_OTHER): Payer: PRIVATE HEALTH INSURANCE | Admitting: Adult Health

## 2016-06-06 VITALS — BP 160/80 | HR 87 | Ht 65.0 in | Wt 144.0 lb

## 2016-06-06 DIAGNOSIS — I429 Cardiomyopathy, unspecified: Secondary | ICD-10-CM

## 2016-06-06 DIAGNOSIS — I428 Other cardiomyopathies: Secondary | ICD-10-CM

## 2016-06-06 DIAGNOSIS — I10 Essential (primary) hypertension: Secondary | ICD-10-CM

## 2016-06-06 DIAGNOSIS — Z720 Tobacco use: Secondary | ICD-10-CM | POA: Diagnosis not present

## 2016-06-06 MED ORDER — SPIRONOLACTONE 25 MG PO TABS
12.5000 mg | ORAL_TABLET | Freq: Every day | ORAL | 6 refills | Status: DC
Start: 2016-06-06 — End: 2017-06-13

## 2016-06-06 MED ORDER — BISOPROLOL FUMARATE 10 MG PO TABS: 10.0000 mg | ORAL_TABLET | Freq: Every day | ORAL | 6 refills | Status: DC

## 2016-06-06 MED ORDER — FUROSEMIDE 20 MG PO TABS: 20.0000 mg | ORAL_TABLET | Freq: Every day | ORAL | 6 refills | Status: DC

## 2016-06-06 NOTE — Patient Instructions (Signed)
Your physician recommends that you continue on your current medications as directed. Please refer to the Current Medication list given to you today.   Your physician recommends that you schedule a follow-up appointment in: 1 month   I have refilled cardiac medications   Please email your blood pressures to Jory Sims, N.P.   Thanks for choosing [facility]!!!

## 2016-06-06 NOTE — Progress Notes (Signed)
Cardiology Office Note   Date:  06/06/2016   ID:  Tammy Mcpherson, DOB 1956-03-07, MRN [id number]  PCP:  [facility]  Cardiologist:   Jory Sims, NP   No chief complaint on file.     History of Present Illness: Tammy Mcpherson is a 60 y.o. female who presents for worsening for ongoing assessment and management of NICM, hypertension, PSVT/PVCs, CAD, and CHF. She was last seen in the office on 05/23/2016 to discuss diagnostic testing.   Upsloping ST segment depression ST segment depression was noted during stress in the II, III, aVF, V4, V5 and V6 leads. Overall nonspecific EKG changes.  The left ventricular ejection fraction is severely decreased (<30%).  The study is normal. There are no perfusion defects consistent with prior infarct or current ischemia  This is a high risk study. High risk based on decreased LVEF. There is no myocardium currently at jeopardy.  Echocardiogram 05/01/2016 Left ventricle: The cavity size was moderately dilated. Wall thickness was increased in a pattern of mild LVH. Systolic function was mildly to moderately reduced. The estimated ejection fraction was in the range of 40% to 45%. Diffuse hypokinesis. The study is not technically sufficient to allow evaluation of LV diastolic function. Doppler parameters are consistent with both elevated ventricular end-diastolic filling pressure and elevated left atrial filling pressure. - Mitral valve: Mildly thickened leaflets . There was trivial regurgitation. - Left atrium: The atrium was moderately dilated. - Right atrium: Central venous pressure (est): 3 mm Hg. - Tricuspid valve: There was trivial regurgitation. - Pulmonary arteries: PA peak pressure: 26 mm Hg (S). - Pericardium, extracardiac: There was no pericardial effusion.  On last office visit, Cozaar was increased to 25 mg daily from 12.5 mg daily I BMET was ordered and she was to return with blood pressure  recording to evaluate her response. The patient may be able to return to work if blood pressures are stable. Will continue medical management but will discuss ICD if LV function does not improve.  Labs dated 05/31/2016 revealed a sodium 134 potassium 4.0 chloride 104 CO2 24 BUN 14 creatinine 0.94.  Anemia profile revealed iron of 92 view IBC 349 TIBC 441 ferritin 242 with folate 23.3.  She comes today not feeling any better. She states her blood pressure still elevated. She is under a lot of stress. She has no income. She is applying for disability. She has multiple appointments with hematology with follow-up labs and x-rays. She does not feel as if she can return to work although she states that she needs the money. Multiple somatic complaints. Ongoing tobacco abuse.  Past Medical History:  Diagnosis Date  . Anemia 04/30/2016  . Cardiomyopathy [facility])    Diagnosed July 2017  . Combined congestive systolic and diastolic heart failure ([place])   . COPD (chronic obstructive pulmonary disease) ([place])    a. suspected COPD.  Marland Kitchen Coronary artery calcification seen on CT scan   . Essential hypertension   . History of bronchitis   . Iron deficiency anemia 05/12/2016  . PSVT (paroxysmal supraventricular tachycardia) ([place])    a. first noted on tele during adm 7-05/2016.  Marland Kitchen PVC's (premature ventricular contractions)    a. first noted on tele during adm 7-05/2016.  Marland Kitchen Thrombocytosis ([place])     Past Surgical History:  Procedure Laterality Date  . ABDOMINAL HYSTERECTOMY    . Barbie Banner OSTEOTOMY Right 07/10/2013   Procedure: Barbie Banner OSTEOTOMY RIGHT FOOT;  Surgeon: Marcheta Grammes, DPM;  Location: [facility];  Service: Orthopedics;  Laterality: Right;  . BUNIONECTOMY Right 07/10/2013   Procedure: VOGLER BUNIONECTOMY RIGHT FOOT;  Surgeon: Marcheta Grammes, DPM;  Location: [facility];  Service: Orthopedics;  Laterality: Right;  . METATARSAL HEAD EXCISION Right 07/10/2013   Procedure: METATARSAL HEAD RESECTION OF DIGITS  2 AND 3 RIGHT FOOT;  Surgeon: Marcheta Grammes, DPM;  Location: [facility];  Service: Orthopedics;  Laterality: Right;  . PROXIMAL INTERPHALANGEAL FUSION (PIP) Right 07/10/2013   Procedure: ARTHRODESIS PIPJ  2ND DIGIT RIGHT FOOT;  Surgeon: Marcheta Grammes, DPM;  Location: [facility];  Service: Orthopedics;  Laterality: Right;     Current Outpatient Prescriptions  Medication Sig Dispense Refill  . albuterol (PROVENTIL HFA;VENTOLIN HFA) 108 (90 Base) MCG/ACT inhaler Inhale 2 puffs into the lungs every 6 (six) hours as needed for wheezing or shortness of breath. Please instruct in proper technique. 1 Inhaler 0  . aspirin EC 81 MG EC tablet Take 1 tablet (81 mg total) by mouth daily.    . bisoprolol (ZEBETA) 10 MG tablet Take 1 tablet (10 mg total) by mouth daily. 30 tablet 6  . ferrous gluconate (FERGON) 324 MG tablet Take 1 tablet (324 mg total) by mouth 2 (two) times daily with a meal.    . furosemide (LASIX) 20 MG tablet Take 1 tablet (20 mg total) by mouth daily. 30 tablet 6  . losartan (COZAAR) 25 MG tablet Take 1 tablet (25 mg total) by mouth daily. 90 tablet 3  . spironolactone (ALDACTONE) 25 MG tablet Take 0.5 tablets (12.5 mg total) by mouth daily. 30 tablet 6   No current facility-administered medications for this visit.     Allergies:   Codeine; Penicillins; Bee venom; and Sudafed [pseudoephedrine hcl]    Social History:  The patient  reports that she has been smoking Cigarettes.  She started smoking about 41 years ago. She has a 9.25 pack-year smoking history. She has never used smokeless tobacco. She reports that she does not drink alcohol or use drugs.   Family History:  The patient's family history includes Coronary artery disease in her sister; Hypertension in her father.    ROS: All other systems are reviewed and negative. Unless otherwise mentioned in H&P    PHYSICAL EXAM: VS:  BP (!) 160/80   Pulse 87   Ht 5\' 5"  (1.651 m)   Wt 144 lb (65.3 kg)   SpO2 94%   BMI  23.96 kg/m  , BMI Body mass index is 23.96 kg/m. GEN: Well nourished, well developed, in no acute distress  HEENT: normal  Neck: no JVD, carotid bruits, or masses Cardiac: RRR; no murmurs, rubs, or gallops,no edema  Respiratory:  clear to auscultation bilaterally, normal work of breathing GI: soft, nontender, nondistended, + BS MS: no deformity or atrophy  Skin: warm and dry, no rash Neuro:  Strength and sensation are intact Psych: euthymic mood, full affect  Recent Labs: 04/30/2016: B Natriuretic Peptide 446.0; TSH 1.727 05/31/2016: ALT 21; BUN 14; Creatinine, Ser 0.94; Hemoglobin 13.0; Platelets 384; Potassium 4.0; Sodium 134    Lipid Panel    Component Value Date/Time   CHOL 209 (H) 05/09/2016 1027   TRIG 188 (H) 05/09/2016 1027   HDL 50 05/09/2016 1027   CHOLHDL 4.2 05/09/2016 1027   VLDL 38 (H) 05/09/2016 1027   LDLCALC 121 05/09/2016 1027      Wt Readings from Last 3 Encounters:  06/06/16 144 lb (65.3 kg)  05/31/16 147 lb (66.7 kg)  05/23/16  147 lb (66.7 kg)     ASSESSMENT AND PLAN:  1.  Nonischemic cardiomyopathy: Most recent ejection fraction 40-45%. She is compliant with her medications currently. Would like to adjust but had no records of her blood pressures at home. She is elevated today but she states she is nervous and stressed about being in the office. She is applying for disability.  2. Hypertension: She is not keeping records of her blood pressure despite increase in doses of Cozaar from 12.5 daily to 25 mg daily. Blood pressure is elevated here in the office. She states is related to being nervous coming to the office. I've again encouraged her to please take her blood pressure daily and record this. She is signed up for my chart and therefore she can send me the results from her home. May need to increase Cozaar to 50 mg daily.  3. Ongoing tobacco abuse: I have again counseled her on smoking cessation. She does not appear ready to stop smoking and she  states that it helps her nerves and anxiety. I have stressed again the need to quit.  Current medicines are reviewed at length with the patient today.    Labs/ tests ordered today include:  No orders of the defined types were placed in this encounter.    Disposition:   FU with one month  Signed, Jory Sims, NP  06/06/2016 5:04 PM    [facility] [address] [place], [place] [address] Phone: [phone]; Fax: [phone]

## 2016-06-06 NOTE — Progress Notes (Signed)
Name: Tammy Mcpherson    DOB: 11-01-1955  Age: 60 y.o.  MR#: [id number]       PCP:  [place]: Payor: [place] / Plan: BCBS OTHER / Product Type: *No Product type* /   CC:   No chief complaint on file.   VS Vitals:   06/06/16 1510  Weight: 144 lb (65.3 kg)  Height: 5\' 5"  (1.651 m)    Weights Current Weight  06/06/16 144 lb (65.3 kg)  05/31/16 147 lb (66.7 kg)  05/23/16 147 lb (66.7 kg)    Blood Pressure  BP Readings from Last 3 Encounters:  05/31/16 (!) 175/70  05/23/16 (!) 150/80  05/18/16 (!) 128/54     Admit date:  (Not on file) Last encounter with RMR:  05/30/2016   Allergy Codeine; Penicillins; Bee venom; and Sudafed [pseudoephedrine hcl]  Current Outpatient Prescriptions  Medication Sig Dispense Refill  . albuterol (PROVENTIL HFA;VENTOLIN HFA) 108 (90 Base) MCG/ACT inhaler Inhale 2 puffs into the lungs every 6 (six) hours as needed for wheezing or shortness of breath. Please instruct in proper technique. 1 Inhaler 0  . aspirin EC 81 MG EC tablet Take 1 tablet (81 mg total) by mouth daily.    . bisoprolol (ZEBETA) 10 MG tablet Take 1 tablet (10 mg total) by mouth daily. 30 tablet 6  . ferrous gluconate (FERGON) 324 MG tablet Take 1 tablet (324 mg total) by mouth 2 (two) times daily with a meal.    . furosemide (LASIX) 20 MG tablet Take 1 tablet (20 mg total) by mouth daily. 30 tablet 0  . losartan (COZAAR) 25 MG tablet Take 1 tablet (25 mg total) by mouth daily. 90 tablet 3  . spironolactone (ALDACTONE) 25 MG tablet Take 0.5 tablets (12.5 mg total) by mouth daily. 30 tablet 0   No current facility-administered medications for this visit.     Discontinued Meds:   There are no discontinued medications.  Patient Active Problem List   Diagnosis Date Noted  . Iron deficiency anemia 05/12/2016  . Cardiomyopathy- etiology not yet determined 05/02/2016  . CAD-Ca++ coronaries on CTA 05/02/2016  . Elevated troponin  05/02/2016  . Acute combined systolic and diastolic heart failure ([place]) 04/30/2016  . Acute respiratory failure with hypoxia ([place]) 04/30/2016  . COPD exacerbation ([place]) 04/30/2016  . Essential hypertension 04/30/2016  . Anemia 04/30/2016  . Tobacco use disorder 04/30/2016    LABS    Component Value Date/Time   NA 134 (L) 05/31/2016 0816   NA 137 05/26/2016 1202   NA 136 05/09/2016 1027   K 4.0 05/31/2016 0816   K 4.1 05/26/2016 1202   K 5.0 05/09/2016 1027   CL 104 05/31/2016 0816   CL 104 05/26/2016 1202   CL 98 05/09/2016 1027   CO2 24 05/31/2016 0816   CO2 23 05/26/2016 1202   CO2 24 05/09/2016 1027   GLUCOSE 152 (H) 05/31/2016 0816   GLUCOSE 140 (H) 05/26/2016 1202   GLUCOSE 76 05/09/2016 1027   BUN 14 05/31/2016 0816   BUN 14 05/26/2016 1202   BUN 18 05/09/2016 1027   CREATININE 0.94 05/31/2016 0816   CREATININE 0.89 05/26/2016 1202   CREATININE 0.92 05/09/2016 1027   CREATININE 1.08 (H) 05/03/2016 0533   CALCIUM 8.9 05/31/2016 0816   CALCIUM 9.4 05/26/2016 1202   CALCIUM 9.3 05/09/2016 1027   GFRNONAA >60 05/31/2016 0816   GFRNONAA >60 05/26/2016 1202   GFRNONAA  55 (L) 05/03/2016 0533   GFRAA >60 05/31/2016 0816   GFRAA >60 05/26/2016 1202   GFRAA >60 05/03/2016 0533   CMP     Component Value Date/Time   NA 134 (L) 05/31/2016 0816   K 4.0 05/31/2016 0816   CL 104 05/31/2016 0816   CO2 24 05/31/2016 0816   GLUCOSE 152 (H) 05/31/2016 0816   BUN 14 05/31/2016 0816   CREATININE 0.94 05/31/2016 0816   CREATININE 0.92 05/09/2016 1027   CALCIUM 8.9 05/31/2016 0816   PROT 7.2 05/31/2016 0816   ALBUMIN 3.8 05/31/2016 0816   AST 23 05/31/2016 0816   ALT 21 05/31/2016 0816   ALKPHOS 81 05/31/2016 0816   BILITOT 0.2 (L) 05/31/2016 0816   GFRNONAA >60 05/31/2016 0816   GFRAA >60 05/31/2016 0816       Component Value Date/Time   WBC 9.3 05/31/2016 0816   WBC 25.9 (H) 05/09/2016 1027   WBC 19.2 (H) 05/03/2016 0533   HGB 13.0 05/31/2016 0816   HGB 11.0  (L) 05/09/2016 1027   HGB 9.3 (L) 05/03/2016 0533   HCT 41.5 05/31/2016 0816   HCT 37.2 05/09/2016 1027   HCT 30.9 (L) 05/03/2016 0533   MCV 80.9 05/31/2016 0816   MCV 73.4 (L) 05/09/2016 1027   MCV 75.0 (L) 05/03/2016 0533    Lipid Panel     Component Value Date/Time   CHOL 209 (H) 05/09/2016 1027   TRIG 188 (H) 05/09/2016 1027   HDL 50 05/09/2016 1027   CHOLHDL 4.2 05/09/2016 1027   VLDL 38 (H) 05/09/2016 1027   LDLCALC 121 05/09/2016 1027    ABG No results found for: PHART, PCO2ART, PO2ART, HCO3, TCO2, ACIDBASEDEF, O2SAT   Lab Results  Component Value Date   TSH 1.727 04/30/2016   BNP (last 3 results)  Recent Labs  04/30/16 0719  BNP 446.0*    ProBNP (last 3 results) No results for input(s): PROBNP in the last 8760 hours.  Cardiac Panel (last 3 results) No results for input(s): CKTOTAL, CKMB, TROPONINI, RELINDX in the last 72 hours.  Iron/TIBC/Ferritin/ %Sat    Component Value Date/Time   IRON 92 05/31/2016 0817   TIBC 441 05/31/2016 0817   FERRITIN 242 05/31/2016 0817   IRONPCTSAT 21 05/31/2016 0817     EKG Orders placed or performed in visit on 05/09/16  . EKG 12-Lead     Prior Assessment and Plan Problem List as of 06/06/2016 Reviewed: 05/31/2016 10:53 AM by Sullivan Lone, MD     Cardiovascular and Mediastinum   Acute combined systolic and diastolic heart failure ([place])   Essential hypertension   Cardiomyopathy- etiology not yet determined   CAD-Ca++ coronaries on CTA     Respiratory   Acute respiratory failure with hypoxia ([facility])   COPD exacerbation ([facility])     Other   Anemia   Tobacco use disorder   Elevated troponin   Iron deficiency anemia       Imaging: Nm Myocar Multi W/spect W/wall Motion / Ef  Result Date: 05/19/2016  Upsloping ST segment depression ST segment depression was noted during stress in the II, III, aVF, V4, V5 and V6 leads. Overall nonspecific EKG changes.  The left ventricular ejection fraction is severely decreased  (<30%).  The study is normal. There are no perfusion defects consistent with prior infarct or current ischemia  This is a high risk study. High risk based on decreased LVEF. There is no myocardium currently at jeopardy.

## 2016-06-08 ENCOUNTER — Telehealth (HOSPITAL_COMMUNITY): Payer: Self-pay | Admitting: *Deleted

## 2016-06-08 NOTE — Telephone Encounter (Signed)
-----   Message from [facility], PA-C sent at 06/07/2016  5:14 PM EDT ----- Stable

## 2016-06-08 NOTE — Telephone Encounter (Signed)
LMOM that labs are stable.   

## 2016-06-09 ENCOUNTER — Encounter (HOSPITAL_COMMUNITY): Payer: Self-pay

## 2016-06-09 ENCOUNTER — Encounter (HOSPITAL_COMMUNITY): Payer: PRIVATE HEALTH INSURANCE | Attending: Oncology

## 2016-06-09 VITALS — BP 162/63 | HR 72 | Temp 98.4°F | Resp 18

## 2016-06-09 DIAGNOSIS — D509 Iron deficiency anemia, unspecified: Secondary | ICD-10-CM

## 2016-06-09 DIAGNOSIS — D649 Anemia, unspecified: Secondary | ICD-10-CM | POA: Insufficient documentation

## 2016-06-09 DIAGNOSIS — D473 Essential (hemorrhagic) thrombocythemia: Secondary | ICD-10-CM | POA: Insufficient documentation

## 2016-06-09 MED ORDER — SODIUM CHLORIDE 0.9 % IV SOLN
INTRAVENOUS | Status: DC
  Administered 2016-06-09: 15:00:00 via INTRAVENOUS

## 2016-06-09 MED ORDER — SODIUM CHLORIDE 0.9 % IV SOLN
510.0000 mg | Freq: Once | INTRAVENOUS | Status: AC
  Administered 2016-06-09: 510 mg via INTRAVENOUS
  Filled 2016-06-09: qty 17

## 2016-06-09 NOTE — Patient Instructions (Signed)
[facility] at [facility] Discharge Instructions  RECOMMENDATIONS MADE BY THE CONSULTANT AND ANY TEST RESULTS WILL BE SENT TO YOUR REFERRING PHYSICIAN.  Iron infusion today. Return as scheduled for lab work and office visit.  Thank you for choosing [facility] at [facility] to provide your oncology and hematology care.  To afford each patient quality time with our provider, please arrive at least 15 minutes before your scheduled appointment time.   Beginning January 23rd 2017 lab work for the Cancer Center will be done in the  Main lab at [place] on 1st floor. If you have a lab appointment with the Cancer Center please come in thru the  Main Entrance and check in at the main information desk  You need to re-schedule your appointment should you arrive 10 or more minutes late.  We strive to give you quality time with our providers, and arriving late affects you and other patients whose appointments are after yours.  Also, if you no show three or more times for appointments you may be dismissed from the clinic at the providers discretion.     Again, thank you for choosing [facility].  Our hope is that these requests will decrease the amount of time that you wait before being seen by our physicians.       _____________________________________________________________  Should you have questions after your visit to [facility], please contact our office at [phone] between the hours of 8:30 a.m. and 4:30 p.m.  Voicemails left after 4:30 p.m. will not be returned until the following business day.  For prescription refill requests, have your pharmacy contact our office.         Resources For Cancer Patients and their Caregivers ? American Cancer Society: Can assist with transportation, wigs, general needs, runs Look Good Feel Better.        1-888-227-6333 ? Cancer Care: Provides financial assistance, online  support groups, medication/co-pay assistance.  1-800-813-HOPE (4673) ? [facility] Assists [place] cancer patients and their families through emotional , educational and financial support.  336-427-4357 ? [place] DSS Where to apply for food stamps, Medicaid and utility assistance. 336-342-1394 ? [facility]: Transportation to medical appointments. 336-347-2287 ? Social Security Administration: May apply for disability if have a Stage IV cancer. 336-342-7796 1-800-772-1213 ? [place] Aging, Disability and Transit Services: Assists with nutrition, care and transit needs. 336-349-2343  Cancer Center Support Programs: @10RELATIVEDAYS@ > Cancer Support Group  2nd Tuesday of the month 1pm-2pm, Journey Room  > Creative Journey  3rd Tuesday of the month 1130am-1pm, Journey Room  > Look Good Feel Better  1st Wednesday of the month 10am-12 noon, Journey Room (Call American Cancer Society to register 1-800-395-5775)   

## 2016-06-09 NOTE — Progress Notes (Signed)
Tolerated infusion w/o adverse reaction.  Alert, in no distress.  VSS.  Discharged ambulatory.  

## 2016-06-14 ENCOUNTER — Ambulatory Visit: Payer: PRIVATE HEALTH INSURANCE | Admitting: Gastroenterology

## 2016-06-14 ENCOUNTER — Telehealth: Payer: Self-pay | Admitting: *Deleted

## 2016-06-14 ENCOUNTER — Ambulatory Visit (HOSPITAL_COMMUNITY)
Admission: RE | Admit: 2016-06-14 | Discharge: 2016-06-14 | Disposition: A | Discharge status: Home / Self Care | Payer: PRIVATE HEALTH INSURANCE | Source: Ambulatory Visit | Attending: Hematology | Admitting: Hematology

## 2016-06-14 DIAGNOSIS — R1084 Generalized abdominal pain: Secondary | ICD-10-CM | POA: Diagnosis present

## 2016-06-14 DIAGNOSIS — I700 Atherosclerosis of aorta: Secondary | ICD-10-CM | POA: Insufficient documentation

## 2016-06-14 DIAGNOSIS — K829 Disease of gallbladder, unspecified: Secondary | ICD-10-CM | POA: Diagnosis not present

## 2016-06-14 MED ORDER — IOPAMIDOL (ISOVUE-300) INJECTION 61%
100.0000 mL | Freq: Once | INTRAVENOUS | Status: AC | PRN
  Administered 2016-06-14: 100 mL via INTRAVENOUS

## 2016-06-14 NOTE — Telephone Encounter (Signed)
"  [place] with [facility] radiology with a call report for Dr. Irene Limbo.  Patient S/P CT Abdomen and pelvis today.   IMPRESSION: Question mass at gallbladder fundus 2.8 x 1.8 x 2.2 cm versus changes related to stone disease within a Phrygian cap; sonographic assessment recommended to exclude neoplasm.  Wall thickening of distal esophagus, question esophagitis or reflux disease, tumor not excluded, recommend either esophagram or endoscopic assessment."

## 2016-06-21 ENCOUNTER — Telehealth: Payer: Self-pay | Admitting: *Deleted

## 2016-06-21 MED ORDER — LOSARTAN POTASSIUM 50 MG PO TABS
50.0000 mg | ORAL_TABLET | Freq: Every day | ORAL | 3 refills | Status: DC
Start: 2016-06-21 — End: 2017-01-01

## 2016-06-21 NOTE — Telephone Encounter (Signed)
Message left on private voice mail to continue BP log and increase Cozaar to 50 mg Daily and call with any questions.

## 2016-06-22 ENCOUNTER — Other Ambulatory Visit: Payer: Self-pay

## 2016-06-22 ENCOUNTER — Ambulatory Visit (INDEPENDENT_AMBULATORY_CARE_PROVIDER_SITE_OTHER): Payer: PRIVATE HEALTH INSURANCE | Admitting: Gastroenterology

## 2016-06-22 ENCOUNTER — Telehealth: Payer: Self-pay

## 2016-06-22 ENCOUNTER — Encounter: Payer: Self-pay | Admitting: Gastroenterology

## 2016-06-22 DIAGNOSIS — K625 Hemorrhage of anus and rectum: Secondary | ICD-10-CM

## 2016-06-22 DIAGNOSIS — Q441 Other congenital malformations of gallbladder: Secondary | ICD-10-CM

## 2016-06-22 DIAGNOSIS — D509 Iron deficiency anemia, unspecified: Secondary | ICD-10-CM

## 2016-06-22 DIAGNOSIS — K8020 Calculus of gallbladder without cholecystitis without obstruction: Secondary | ICD-10-CM | POA: Insufficient documentation

## 2016-06-22 DIAGNOSIS — R932 Abnormal findings on diagnostic imaging of liver and biliary tract: Secondary | ICD-10-CM

## 2016-06-22 MED ORDER — OMEPRAZOLE 20 MG PO CPDR: DELAYED_RELEASE_CAPSULE | ORAL | 11 refills | Status: DC

## 2016-06-22 NOTE — Assessment & Plan Note (Signed)
MAY BE DUE TO COLON POLYPS, ESOPHAGITIS, LESS LIKLEY GI MALIGNANCY-CHOLANGIOCA.  DRINK WATER TO KEEP YOUR URINE LIGHT YELLOW.   COMPLETE MRI OF GALLBLADDER.  SEE SURGERY IN 7-10 DAYS TO DISCUSS REMOVING GALLBLADDER.  COMPLETE COLONOSCOPY AND UPPER ENDOSCOPY IN 2-3 WEEKS. HOLD LASIX ON DAY OF COLONOSCOPY. HOLD IRON FOR 7 DAYS PRIOR TO COLONOSCOPY. DISCUSSED PROCEDURE, BENEFITS, & RISKS: < 1% chance of medication reaction, bleeding, perforation, or rupture of spleen/liver. PHENERGAN 12.5 MG IV IN PREOP.  START OMEPRAZOLE.  TAKE 30 MINUTES PRIOR TO YOUR FIRST MEAL.   FOLLOW UP IN 4 MOS.

## 2016-06-22 NOTE — Progress Notes (Signed)
cc'ed to pcp °

## 2016-06-22 NOTE — Progress Notes (Signed)
Subjective:    [name], female    DOB: Dec 27, 1955, 60 y.o.   MRN: [id number]  [facility]  HPI CONCERNED  ABOUT HER LIPS BEING BLUE AND LOW BLOOD COUNT. NO TCS.EGD EVER. 6 MOS AGO A LITTLE BLOOD ON TOILET PAPER. HOSPITALIZED IN AUGUST BECAUSE SHE COULDN'T BREATHE. WEIGHT LOSS: A LITTLE NOT TRYING. EATS AND GETS NAUSEA(MODERATE AND MAKES HER NOT WANT TO EAT). LASTS ~20 MINS.  HEARTBURN IF EATS SPICY FOODS.   CHANGE IN BOWEL IN HABITS-BMs: OCCASIONAL CONSTIPATION NEW THING SINCE AUGUST. APPETITE: DOESN'T HAVE ONE. NOW SMOKING 4-5 CIGS A DAY.    PT DENIES FEVER, CHILLS, HEMATEMESIS, nausea, vomiting, melena, diarrhea, CHEST PAIN, SHORTNESS OF BREATH, abdominal pain, problems swallowing, OR problems with sedation.  Past Medical History:  Diagnosis Date  . Anemia 04/30/2016  . Cardiomyopathy [facility])    Diagnosed July 2017  . Combined congestive systolic and diastolic heart failure ([place])   . COPD (chronic obstructive pulmonary disease) ([place])    a. suspected COPD.  Marland Kitchen Coronary artery calcification seen on CT scan   . Essential hypertension   . History of bronchitis   . Iron deficiency anemia 05/12/2016  . PSVT (paroxysmal supraventricular tachycardia) ([place])    a. first noted on tele during adm 7-05/2016.  Marland Kitchen PVC's (premature ventricular contractions)    a. first noted on tele during adm 7-05/2016.  Marland Kitchen Thrombocytosis ([place])     Past Surgical History:  Procedure Laterality Date  . ABDOMINAL HYSTERECTOMY    . Barbie Banner OSTEOTOMY Right 07/10/2013   Procedure: Barbie Banner OSTEOTOMY RIGHT FOOT;  Surgeon: Marcheta Grammes, DPM;  Location: [facility];  Service: Orthopedics;  Laterality: Right;  . BUNIONECTOMY Right 07/10/2013   Procedure: VOGLER BUNIONECTOMY RIGHT FOOT;  Surgeon: Marcheta Grammes, DPM;  Location: [facility];  Service: Orthopedics;  Laterality: Right;  . METATARSAL HEAD EXCISION Right 07/10/2013   Procedure: METATARSAL HEAD RESECTION OF DIGITS 2 AND 3  RIGHT FOOT;  Surgeon: Marcheta Grammes, DPM;  Location: [facility];  Service: Orthopedics;  Laterality: Right;  . PROXIMAL INTERPHALANGEAL FUSION (PIP) Right 07/10/2013   Procedure: ARTHRODESIS PIPJ  2ND DIGIT RIGHT FOOT;  Surgeon: Marcheta Grammes, DPM;  Location: [facility];  Service: Orthopedics;  Laterality: Right;    Allergies  Allergen Reactions  . Codeine Anaphylaxis  . Penicillins Swelling and Rash      . Bee Venom   . Sudafed [Pseudoephedrine Hcl] Rash    Current Outpatient Prescriptions  Medication Sig Dispense Refill  . albuterol (PROVENTIL HFA;VENTOLIN HFA) 108 (90 Base) MCG/ACT inhaler Inhale PRN hours as needed for wheezing  1 Inhaler 0  . aspirin EC 81 MG EC tablet Take 1 tablet (81 mg total) by mouth daily.    . bisoprolol (ZEBETA) 10 MG tablet Take 1 tablet (10 mg total) by mouth daily. 30 tablet 6  . ferrous gluconate (FERGON) 324 MG tablet Take 1 tablet (324 mg total) by mouth 2 (two) times daily with a meal.    . furosemide (LASIX) 20 MG tablet Take 1 tablet (20 mg total) by mouth daily. 30 tablet 6  . losartan (COZAAR) 50 MG tablet Take 1 tablet (50 mg total) by mouth daily. 90 tablet 3  . spironolactone (ALDACTONE) 25 MG tablet Take 0.5 tablets (12.5 mg total) by mouth daily. 30 tablet 6   Family History  Problem Relation Age of Onset  . Hypertension Father   . Coronary artery disease Sister   ADOPTED  Social History   Social History  . Marital status: Married    Spouse name: N/A  . Number of children: ONE CHILD 78 YO  . Years of education: 12TH GRADE   Social History Main Topics  . Smoking status: Current Every Day Smoker    Packs/day: 0.25    Years: 37.00    Types: Cigarettes    Start date: 05/10/1975  . Smokeless tobacco: Never Used  . Alcohol use No  . Drug use: No  . 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  Review of Systems PER HPI OTHERWISE ALL SYSTEMS ARE NEGATIVE.      Objective:   Physical Exam  Constitutional: She is oriented to person, place, and time. She appears well-developed and well-nourished. No distress.  HENT:  Head: Normocephalic and atraumatic.  Mouth/Throat: Oropharynx is clear and moist. No oropharyngeal exudate.  Eyes: Pupils are equal, round, and reactive to light. No scleral icterus.  Neck: Normal range of motion. Neck supple.  Cardiovascular: Normal rate, regular rhythm and normal heart sounds.   Pulmonary/Chest: Effort normal and breath sounds normal. No respiratory distress.  Abdominal: Soft. Bowel sounds are normal. She exhibits no distension. There is no tenderness.  Musculoskeletal: She exhibits no edema.  Lymphadenopathy:    She has no cervical adenopathy.  Neurological: She is alert and oriented to person, place, and time.  NO  NEW FOCAL DEFICITS  Psychiatric: She has a normal mood and affect.  Vitals reviewed.     Assessment & Plan:

## 2016-06-22 NOTE — Telephone Encounter (Signed)
Pt came back to office after her appt and stated she does not want to do MRI of gallbladder. She is claustrophobic and upset about doing it. Reassured her that I would let [facility] know she doesn't want to do MRI. Referral had been sent to Dr. Arnoldo Morale office. Claiborne Billings from Dr. Arnoldo Morale office called and said that Dr. Arnoldo Morale had reviewed pt's info and will see pt after her upcoming oncology and cardiology appts. Informed pt and appt was set-up with Dr. Arnoldo Morale 07/08/16.

## 2016-06-22 NOTE — Telephone Encounter (Signed)
Called pt to discuss concerns. Needs CA 19-9. MAIL ORDER TO PT. ALREADY HAS BLOOD DRAW SCHEDULED FOR SEP 27. PT WOULD LIKE TO HOLD OFF ON MRI. NO NEED FOR ONCOLOGY APPT. CANCEL ONCOLOGY APPT FOR NOW.

## 2016-06-22 NOTE — Progress Notes (Signed)
ON RECALL  °

## 2016-06-22 NOTE — Patient Instructions (Signed)
DRINK WATER TO KEEP YOUR URINE LIGHT YELLOW.   COMPLETE MRI OF GALLBLADDER.  SEE SURGERY IN 7-10 DAYS TO DISCUSS REMOVING GALLBLADDER.  COMPLETE COLONOSCOPY AND UPPER ENDOSCOPY IN 2-3 WEEKS. HOLD LASIX ON DAY OF COLONOSCOPY. HOLD IRON FOR 7 DAYS PRIOR TO COLONOSCOPY.  START OMEPRAZOLE.  TAKE 30 MINUTES PRIOR TO YOUR FIRST MEAL.   FOLLOW UP IN 4 MOS.

## 2016-06-22 NOTE — Assessment & Plan Note (Addendum)
FOUND ON CT AND NOT ASSOCIATED WITH ELEVATED LIVER ENZYMES  DRINK WATER TO KEEP YOUR URINE LIGHT YELLOW.   CANCELLED DUE TO COST.  SEE SURGERY IN 7-10 DAYS TO DISCUSS REMOVING GALLBLADDER. I PERSONALLY REVIEWED THE CT WITH DR. BOLES-GB ABNORMAL. START OMEPRAZOLE.  TAKE 30 MINUTES PRIOR TO YOUR FIRST MEAL.   FOLLOW UP IN 4 MOS.

## 2016-06-22 NOTE — Telephone Encounter (Signed)
Lab order mailed to pt per Dr. Oneida Alar request.

## 2016-06-26 ENCOUNTER — Telehealth: Payer: Self-pay

## 2016-06-26 NOTE — Telephone Encounter (Signed)
Called pt to clarify appt date with Dr. Arnoldo Morale. Appt is 07/18/16 at 10:00 am.

## 2016-06-28 ENCOUNTER — Encounter (HOSPITAL_COMMUNITY): Payer: PRIVATE HEALTH INSURANCE

## 2016-06-28 ENCOUNTER — Other Ambulatory Visit (HOSPITAL_COMMUNITY)
Admission: RE | Admit: 2016-06-28 | Discharge: 2016-06-28 | Disposition: A | Discharge status: Home / Self Care | Payer: PRIVATE HEALTH INSURANCE | Source: Ambulatory Visit | Attending: Gastroenterology | Admitting: Gastroenterology

## 2016-06-28 DIAGNOSIS — D696 Thrombocytopenia, unspecified: Secondary | ICD-10-CM

## 2016-06-28 DIAGNOSIS — D509 Iron deficiency anemia, unspecified: Secondary | ICD-10-CM | POA: Diagnosis not present

## 2016-06-28 DIAGNOSIS — D72829 Elevated white blood cell count, unspecified: Secondary | ICD-10-CM

## 2016-06-28 DIAGNOSIS — Q441 Other congenital malformations of gallbladder: Secondary | ICD-10-CM | POA: Diagnosis not present

## 2016-06-28 DIAGNOSIS — D649 Anemia, unspecified: Secondary | ICD-10-CM | POA: Diagnosis not present

## 2016-06-28 DIAGNOSIS — D473 Essential (hemorrhagic) thrombocythemia: Secondary | ICD-10-CM | POA: Diagnosis not present

## 2016-06-28 LAB — COMPREHENSIVE METABOLIC PANEL
ALT: 17 U/L (ref 14–54)
AST: 20 U/L (ref 15–41)
Albumin: 3.8 g/dL (ref 3.5–5.0)
Alkaline Phosphatase: 75 U/L (ref 38–126)
Anion gap: 7 (ref 5–15)
BILIRUBIN TOTAL: 0.3 mg/dL (ref 0.3–1.2)
BUN: 9 mg/dL (ref 6–20)
CHLORIDE: 104 mmol/L (ref 101–111)
CO2: 26 mmol/L (ref 22–32)
CREATININE: 0.77 mg/dL (ref 0.44–1.00)
Calcium: 9.3 mg/dL (ref 8.9–10.3)
Glucose, Bld: 140 mg/dL — ABNORMAL HIGH (ref 65–99)
POTASSIUM: 3.7 mmol/L (ref 3.5–5.1)
Sodium: 137 mmol/L (ref 135–145)
TOTAL PROTEIN: 7.2 g/dL (ref 6.5–8.1)

## 2016-06-28 LAB — CBC WITH DIFFERENTIAL/PLATELET
BASOS PCT: 0 %
Basophils Absolute: 0 10*3/uL (ref 0.0–0.1)
EOS PCT: 1 %
Eosinophils Absolute: 0.1 10*3/uL (ref 0.0–0.7)
HEMATOCRIT: 43.3 % (ref 36.0–46.0)
Hemoglobin: 14.1 g/dL (ref 12.0–15.0)
LYMPHS ABS: 2.3 10*3/uL (ref 0.7–4.0)
LYMPHS PCT: 21 %
MCH: 27.6 pg (ref 26.0–34.0)
MCHC: 32.6 g/dL (ref 30.0–36.0)
MCV: 84.7 fL (ref 78.0–100.0)
MONOS PCT: 11 %
Monocytes Absolute: 1.2 10*3/uL — ABNORMAL HIGH (ref 0.1–1.0)
NEUTROS ABS: 7.2 10*3/uL (ref 1.7–7.7)
Neutrophils Relative %: 67 %
PLATELETS: 387 10*3/uL (ref 150–400)
RBC: 5.11 MIL/uL (ref 3.87–5.11)
RDW: 23.5 % — AB (ref 11.5–15.5)
WBC: 10.8 10*3/uL — ABNORMAL HIGH (ref 4.0–10.5)

## 2016-06-28 LAB — IRON AND TIBC
IRON: 83 ug/dL (ref 28–170)
SATURATION RATIOS: 23 % (ref 10.4–31.8)
TIBC: 356 ug/dL (ref 250–450)
UIBC: 273 ug/dL

## 2016-06-28 LAB — FERRITIN: FERRITIN: 192 ng/mL (ref 11–307)

## 2016-06-29 LAB — CANCER ANTIGEN 19-9: CA 19 9: 9 U/mL (ref 0–35)

## 2016-07-05 ENCOUNTER — Telehealth: Payer: Self-pay

## 2016-07-05 NOTE — Telephone Encounter (Signed)
Called pt to change time for TCS/EGD 07/07/16. Time changed to 10:30am. New instruction times given to pt for morning of procedure and she verbalized understanding.

## 2016-07-06 ENCOUNTER — Ambulatory Visit: Payer: BLUE CROSS/BLUE SHIELD | Admitting: Adult Health

## 2016-07-07 ENCOUNTER — Encounter (HOSPITAL_COMMUNITY)
Admission: RE | Disposition: A | Discharge status: Home / Self Care | Payer: Self-pay | Source: Ambulatory Visit | Attending: Gastroenterology

## 2016-07-07 ENCOUNTER — Encounter (HOSPITAL_COMMUNITY): Payer: Self-pay | Admitting: *Deleted

## 2016-07-07 ENCOUNTER — Ambulatory Visit (HOSPITAL_COMMUNITY)
Admission: RE | Admit: 2016-07-07 | Discharge: 2016-07-07 | Disposition: A | Discharge status: Home / Self Care | Payer: PRIVATE HEALTH INSURANCE | Source: Ambulatory Visit | Attending: Gastroenterology | Admitting: Gastroenterology

## 2016-07-07 DIAGNOSIS — I429 Cardiomyopathy, unspecified: Secondary | ICD-10-CM | POA: Diagnosis not present

## 2016-07-07 DIAGNOSIS — R932 Abnormal findings on diagnostic imaging of liver and biliary tract: Secondary | ICD-10-CM

## 2016-07-07 DIAGNOSIS — Z8249 Family history of ischemic heart disease and other diseases of the circulatory system: Secondary | ICD-10-CM | POA: Insufficient documentation

## 2016-07-07 DIAGNOSIS — I110 Hypertensive heart disease with heart failure: Secondary | ICD-10-CM | POA: Diagnosis not present

## 2016-07-07 DIAGNOSIS — I471 Supraventricular tachycardia: Secondary | ICD-10-CM | POA: Insufficient documentation

## 2016-07-07 DIAGNOSIS — K259 Gastric ulcer, unspecified as acute or chronic, without hemorrhage or perforation: Secondary | ICD-10-CM

## 2016-07-07 DIAGNOSIS — K5730 Diverticulosis of large intestine without perforation or abscess without bleeding: Secondary | ICD-10-CM

## 2016-07-07 DIAGNOSIS — K2980 Duodenitis without bleeding: Secondary | ICD-10-CM | POA: Diagnosis not present

## 2016-07-07 DIAGNOSIS — Q2733 Arteriovenous malformation of digestive system vessel: Secondary | ICD-10-CM | POA: Diagnosis not present

## 2016-07-07 DIAGNOSIS — J449 Chronic obstructive pulmonary disease, unspecified: Secondary | ICD-10-CM | POA: Diagnosis not present

## 2016-07-07 DIAGNOSIS — K2970 Gastritis, unspecified, without bleeding: Secondary | ICD-10-CM | POA: Diagnosis not present

## 2016-07-07 DIAGNOSIS — Z882 Allergy status to sulfonamides status: Secondary | ICD-10-CM | POA: Insufficient documentation

## 2016-07-07 DIAGNOSIS — I2510 Atherosclerotic heart disease of native coronary artery without angina pectoris: Secondary | ICD-10-CM | POA: Insufficient documentation

## 2016-07-07 DIAGNOSIS — K644 Residual hemorrhoidal skin tags: Secondary | ICD-10-CM | POA: Diagnosis not present

## 2016-07-07 DIAGNOSIS — K648 Other hemorrhoids: Secondary | ICD-10-CM | POA: Insufficient documentation

## 2016-07-07 DIAGNOSIS — D509 Iron deficiency anemia, unspecified: Secondary | ICD-10-CM

## 2016-07-07 DIAGNOSIS — Z880 Allergy status to penicillin: Secondary | ICD-10-CM | POA: Insufficient documentation

## 2016-07-07 DIAGNOSIS — Q438 Other specified congenital malformations of intestine: Secondary | ICD-10-CM | POA: Diagnosis not present

## 2016-07-07 DIAGNOSIS — I5042 Chronic combined systolic (congestive) and diastolic (congestive) heart failure: Secondary | ICD-10-CM | POA: Diagnosis not present

## 2016-07-07 DIAGNOSIS — F17210 Nicotine dependence, cigarettes, uncomplicated: Secondary | ICD-10-CM | POA: Insufficient documentation

## 2016-07-07 DIAGNOSIS — K31819 Angiodysplasia of stomach and duodenum without bleeding: Secondary | ICD-10-CM | POA: Diagnosis not present

## 2016-07-07 DIAGNOSIS — K2950 Unspecified chronic gastritis without bleeding: Secondary | ICD-10-CM | POA: Diagnosis not present

## 2016-07-07 DIAGNOSIS — K625 Hemorrhage of anus and rectum: Secondary | ICD-10-CM

## 2016-07-07 DIAGNOSIS — Z7982 Long term (current) use of aspirin: Secondary | ICD-10-CM | POA: Insufficient documentation

## 2016-07-07 HISTORY — PX: COLONOSCOPY: SHX5424

## 2016-07-07 HISTORY — PX: ESOPHAGOGASTRODUODENOSCOPY: SHX5428

## 2016-07-07 SURGERY — COLONOSCOPY
Anesthesia: Moderate Sedation

## 2016-07-07 MED ORDER — MIDAZOLAM HCL 5 MG/5ML IJ SOLN
INTRAMUSCULAR | Status: DC | PRN
  Administered 2016-07-07: 1 mg via INTRAVENOUS
  Administered 2016-07-07 (×3): 2 mg via INTRAVENOUS

## 2016-07-07 MED ORDER — STERILE WATER FOR IRRIGATION IR SOLN
Status: DC | PRN
  Administered 2016-07-07: 2.5 mL

## 2016-07-07 MED ORDER — LIDOCAINE VISCOUS 2 % MT SOLN
OROMUCOSAL | Status: DC | PRN
  Administered 2016-07-07: 2 mL via OROMUCOSAL

## 2016-07-07 MED ORDER — MIDAZOLAM HCL 5 MG/5ML IJ SOLN
INTRAMUSCULAR | Status: AC
  Filled 2016-07-07: qty 10

## 2016-07-07 MED ORDER — SODIUM CHLORIDE 0.9% FLUSH
INTRAVENOUS | Status: AC
  Filled 2016-07-07: qty 10

## 2016-07-07 MED ORDER — PROMETHAZINE HCL 25 MG/ML IJ SOLN
12.5000 mg | Freq: Once | INTRAMUSCULAR | Status: AC
  Administered 2016-07-07: 12.5 mg via INTRAVENOUS

## 2016-07-07 MED ORDER — SODIUM CHLORIDE 0.9 % IV SOLN
INTRAVENOUS | Status: DC
  Administered 2016-07-07: 10:00:00 via INTRAVENOUS

## 2016-07-07 MED ORDER — MEPERIDINE HCL 100 MG/ML IJ SOLN
INTRAMUSCULAR | Status: DC
Start: 2016-07-07 — End: 2016-07-07
  Filled 2016-07-07: qty 2

## 2016-07-07 MED ORDER — PROMETHAZINE HCL 25 MG/ML IJ SOLN
INTRAMUSCULAR | Status: AC
  Filled 2016-07-07: qty 1

## 2016-07-07 MED ORDER — MEPERIDINE HCL 100 MG/ML IJ SOLN
INTRAMUSCULAR | Status: DC | PRN
  Administered 2016-07-07 (×4): 25 mg via INTRAVENOUS

## 2016-07-07 NOTE — Interval H&P Note (Signed)
History and Physical Interval Note:  07/07/2016 11:37 AM  Tammy Mcpherson  has presented today for surgery, with the diagnosis of iron deficiency anemia, rectal bleeding, abnormal CT-esophagus  The various methods of treatment have been discussed with the patient and family. After consideration of risks, benefits and other options for treatment, the patient has consented to  Procedure(s) with comments: COLONOSCOPY (N/A) - 2:00 pm - moved to 10:30 - office notified pt ESOPHAGOGASTRODUODENOSCOPY (EGD) (N/A) as a surgical intervention .  The patient's history has been reviewed, patient examined, no change in status, stable for surgery.  I have reviewed the patient's chart and labs.  Questions were answered to the patient's satisfaction.     [employer]

## 2016-07-07 NOTE — Op Note (Signed)
[facility] Patient Name: Tammy Mcpherson Procedure Date: 07/07/2016 12:10 PM MRN: [id number] Date of Birth: 10-27-1955 Attending MD: Barney Drain , MD CSN: [id number] Age: 60 Admit Type: Outpatient Procedure:                Upper GI endoscopy with COLD FORCEPS BIOPSY/APC                            DUODENAL AVMs Indications:              Iron deficiency anemia Providers:                Barney Drain, MD, Jeralyn Bennett, RN, Charlyne Petrin, RN Referring MD:             Redmond School, MD Medicines:                Meperidine 25 mg IV, Midazolam 2 mg IV Complications:            No immediate complications. Estimated Blood Loss:     Estimated blood loss was minimal. Procedure:                Pre-Anesthesia Assessment:                           - Prior to the procedure, a History and Physical                            was performed, and patient medications and                            allergies were reviewed. The patient's tolerance of                            previous anesthesia was also reviewed. The risks                            and benefits of the procedure and the sedation                            options and risks were discussed with the patient.                            All questions were answered, and informed consent                            was obtained. Prior Anticoagulants: The patient has                            taken aspirin, last dose was day of procedure. ASA                            Grade Assessment: II - A patient with mild systemic  disease. After reviewing the risks and benefits,                            the patient was deemed in satisfactory condition to                            undergo the procedure. After obtaining informed                            consent, the endoscope was passed under direct                            vision. Throughout the procedure, the patient's            blood pressure, pulse, and oxygen saturations were                            monitored continuously. The [id number]                            scope was introduced through the mouth, and                            advanced to the second part of duodenum. The upper                            GI endoscopy was accomplished without difficulty.                            The patient tolerated the procedure well. Scope In: 12:15:54 PM Scope Out: 12:29:52 PM Total Procedure Duration: 0 hours 13 minutes 58 seconds  Findings:      The examined esophagus was normal.      Diffuse moderate inflammation characterized by congestion (edema),       erosions and friability was found in the gastric fundus and in the       gastric body. Biopsies were taken with a cold forceps for Helicobacter       pylori testing.      Many non-bleeding cratered gastric ulcers with no stigmata of bleeding       were found in the gastric antrum. This was biopsied with a cold forceps       for histology.      Four 2 to 3 mm angioectasias without bleeding were found in the duodenal       bulb and in the second portion of the duodenum. Coagulation for bleeding       prevention using argon plasma at 0.5 liters/minute and 20 watts was       successful. Impression:               - FEDA DUE TO EROSIVE GASTRITIS/PUD/DUODENAL AVMs                           - MODERATE Gastritis AND DUODENITIS. Marland Kitchen                           - MANY SMALL Non-bleeding gastric ulcers.  Moderate Sedation:      Moderate (conscious) sedation was administered by the endoscopy nurse       and supervised by the endoscopist. The following parameters were       monitored: oxygen saturation, heart rate, blood pressure, and response       to care. Total physician intraservice time was 34 minutes. Recommendation:           - Resume previous diet.                           - Continue present medications. HOLD ASA FOR 2                             WEEKS.                           - Use Prilosec (omeprazole) 20 mg PO daily.                           - Await pathology results.                           - Repeat upper endoscopy in 3 months for                            surveillance.                           - Return to GI office in JAN 2018.                           - Patient has a contact number available for                            emergencies. The signs and symptoms of potential                            delayed complications were discussed with the                            patient. Return to normal activities tomorrow.                            Written discharge instructions were provided to the                            patient. Procedure Code(s):        --- Professional ---                           [id number], 60, Esophagogastroduodenoscopy, flexible,                            transoral; with control of bleeding, any method                           43239, Esophagogastroduodenoscopy, flexible,  transoral; with biopsy, single or multiple                           99152, Moderate sedation services provided by the                            same physician or other qualified health care                            professional performing the diagnostic or                            therapeutic service that the sedation supports,                            requiring the presence of an independent trained                            observer to assist in the monitoring of the                            patient's level of consciousness and physiological                            status; initial 15 minutes of intraservice time,                            patient age 33 years or older                           [phone], Moderate sedation services; each additional                            15 minutes intraservice time Diagnosis Code(s):        --- Professional ---                           K29.70, Gastritis,  unspecified, without bleeding                           K25.9, Gastric ulcer, unspecified as acute or                            chronic, without hemorrhage or perforation                           K31.819, Angiodysplasia of stomach and duodenum                            without bleeding                           D50.9, Iron deficiency anemia, unspecified CPT copyright 2016 American Medical Association. All rights reserved. The codes documented in this report are preliminary and upon coder review may  be revised to meet current compliance  requirements. Barney Drain, MD Barney Drain, MD 07/07/2016 12:52:39 PM This report has been signed electronically. Number of Addenda: 0

## 2016-07-07 NOTE — Op Note (Addendum)
[facility] Patient Name: Tammy Mcpherson Procedure Date: 07/07/2016 11:25 AM MRN: [id number] Date of Birth: Nov 14, 1955 Attending MD: Barney Drain , MD CSN: [id number] Age: 60 Admit Type: Outpatient Procedure:                Colonoscopy, DIAGNOSTIC Indications:              Iron deficiency anemia Providers:                Barney Drain, MD, Jeralyn Bennett, RN, Charlyne Petrin, RN Referring MD:             Redmond School, MD Medicines:                Meperidine 75 mg IV, Midazolam 5 mg IV,                            Promethazine [id number] mg IV Complications:            No immediate complications. Estimated Blood Loss:     Estimated blood loss was minimal. Procedure:                Pre-Anesthesia Assessment:                           - Prior to the procedure, a History and Physical                            was performed, and patient medications and                            allergies were reviewed. The patient's tolerance of                            previous anesthesia was also reviewed. The risks                            and benefits of the procedure and the sedation                            options and risks were discussed with the patient.                            All questions were answered, and informed consent                            was obtained. Prior Anticoagulants: The patient has                            taken aspirin, last dose was day of procedure. ASA                            Grade Assessment: II - A patient with mild systemic  disease. After reviewing the risks and benefits,                            the patient was deemed in satisfactory condition to                            undergo the procedure. After obtaining informed                            consent, the colonoscope was passed under direct                            vision. Throughout the procedure, the patient's   blood pressure, pulse, and oxygen saturations were                            monitored continuously. The Colonoscope was                            introduced through the anus and advanced to the 10                            cm into the ileum. The colonoscopy was somewhat                            difficult due to a tortuous colon. Successful                            completion of the procedure was aided by COLOWRAP.                            The patient tolerated the procedure fairly well.                            The quality of the bowel preparation was excellent.                            The terminal ileum, ileocecal valve, appendiceal                            orifice, and rectum were photographed. Scope In: 11:56:02 AM Scope Out: 12:08:08 PM Scope Withdrawal Time: 0 hours 8 minutes 55 seconds  Total Procedure Duration: 0 hours 12 minutes 6 seconds  Findings:      The digital rectal exam findings include non-thrombosed external       hemorrhoids.      The recto-sigmoid colon and sigmoid colon were moderately redundant. AND       COLOWRAP      A few small-mouthed diverticula were found in the hepatic flexure.      The terminal ileum appeared normal.      Non-bleeding internal hemorrhoids were found. The hemorrhoids were       moderate. Impression:               - Non-thrombosed external hemorrhoids found on  digital rectal exam.                           - Redundant LEFT colon.                           - Diverticulosis at the hepatic flexure.                           - The examined portion of the ileum was normal.                           - Non-bleeding internal hemorrhoids.                           - NO SOURCE FOR FEDA IDENTIFIED Moderate Sedation:      Moderate (conscious) sedation was administered by the endoscopy nurse       and supervised by the endoscopist. The following parameters were       monitored: oxygen saturation, heart rate,  blood pressure, and response       to care. Total physician intraservice time was 38 minutes. Recommendation:           - Resume previous diet.                           - Continue present medications.                           - Repeat colonoscopy in 10 years for surveillance.                           - Return to GI office in 3 months.                           - Patient has a contact number available for                            emergencies. The signs and symptoms of potential                            delayed complications were discussed with the                            patient. Return to normal activities tomorrow.                            Written discharge instructions were provided to the                            patient. Procedure Code(s):        --- Professional ---                           [phone], Colonoscopy, flexible; diagnostic, including  collection of specimen(s) by brushing or washing,                            when performed (separate procedure)                           99152, Moderate sedation services provided by the                            same physician or other qualified health care                            professional performing the diagnostic or                            therapeutic service that the sedation supports,                            requiring the presence of an independent trained                            observer to assist in the monitoring of the                            patient's level of consciousness and physiological                            status; initial 15 minutes of intraservice time,                            patient age 83 years or older                           [phone], Moderate sedation services; each additional                            15 minutes intraservice time                           99153, Moderate sedation services; each additional                            15 minutes  intraservice time Diagnosis Code(s):        --- Professional ---                           K64.4, Residual hemorrhoidal skin tags                           K64.8, Other hemorrhoids                           D50.9, Iron deficiency anemia, unspecified                           K57.30, Diverticulosis of large intestine  without                            perforation or abscess without bleeding                           Q43.8, Other specified congenital malformations of                            intestine CPT copyright 2016 American Medical Association. All rights reserved. The codes documented in this report are preliminary and upon coder review may  be revised to meet current compliance requirements. Barney Drain, MD Barney Drain, MD 07/07/2016 12:57:12 PM This report has been signed electronically. Number of Addenda: 0

## 2016-07-07 NOTE — H&P (View-Only) (Signed)
Subjective:    [name], female    DOB: 02/26/1956, 60 y.o.   MRN: [id number]  [name]  HPI CONCERNED  ABOUT HER LIPS BEING BLUE AND LOW BLOOD COUNT. NO TCS.EGD EVER. 6 MOS AGO A LITTLE BLOOD ON TOILET PAPER. HOSPITALIZED IN AUGUST BECAUSE SHE COULDN'T BREATHE. WEIGHT LOSS: A LITTLE NOT TRYING. EATS AND GETS NAUSEA(MODERATE AND MAKES HER NOT WANT TO EAT). LASTS ~20 MINS.  HEARTBURN IF EATS SPICY FOODS.   CHANGE IN BOWEL IN HABITS-BMs: OCCASIONAL CONSTIPATION NEW THING SINCE AUGUST. APPETITE: DOESN'T HAVE ONE. NOW SMOKING 4-5 CIGS A DAY.    PT DENIES FEVER, CHILLS, HEMATEMESIS, nausea, vomiting, melena, diarrhea, CHEST PAIN, SHORTNESS OF BREATH, abdominal pain, problems swallowing, OR problems with sedation.  Past Medical History:  Diagnosis Date  . Anemia 04/30/2016  . Cardiomyopathy [facility])    Diagnosed July 2017  . Combined congestive systolic and diastolic heart failure ([place])   . COPD (chronic obstructive pulmonary disease) ([place])    a. suspected COPD.  Marland Kitchen Coronary artery calcification seen on CT scan   . Essential hypertension   . History of bronchitis   . Iron deficiency anemia 05/12/2016  . PSVT (paroxysmal supraventricular tachycardia) ([place])    a. first noted on tele during adm 7-05/2016.  Marland Kitchen PVC's (premature ventricular contractions)    a. first noted on tele during adm 7-05/2016.  Marland Kitchen Thrombocytosis ([place])     Past Surgical History:  Procedure Laterality Date  . ABDOMINAL HYSTERECTOMY    . Barbie Banner OSTEOTOMY Right 07/10/2013   Procedure: Barbie Banner OSTEOTOMY RIGHT FOOT;  Surgeon: Marcheta Grammes, DPM;  Location: [facility];  Service: Orthopedics;  Laterality: Right;  . BUNIONECTOMY Right 07/10/2013   Procedure: VOGLER BUNIONECTOMY RIGHT FOOT;  Surgeon: Marcheta Grammes, DPM;  Location: [facility];  Service: Orthopedics;  Laterality: Right;  . METATARSAL HEAD EXCISION Right 07/10/2013   Procedure: METATARSAL HEAD RESECTION OF DIGITS 2 AND 3  RIGHT FOOT;  Surgeon: Marcheta Grammes, DPM;  Location: [facility];  Service: Orthopedics;  Laterality: Right;  . PROXIMAL INTERPHALANGEAL FUSION (PIP) Right 07/10/2013   Procedure: ARTHRODESIS PIPJ  2ND DIGIT RIGHT FOOT;  Surgeon: Marcheta Grammes, DPM;  Location: [facility];  Service: Orthopedics;  Laterality: Right;    Allergies  Allergen Reactions  . Codeine Anaphylaxis  . Penicillins Swelling and Rash      . Bee Venom   . Sudafed [Pseudoephedrine Hcl] Rash    Current Outpatient Prescriptions  Medication Sig Dispense Refill  . albuterol (PROVENTIL HFA;VENTOLIN HFA) 108 (90 Base) MCG/ACT inhaler Inhale PRN hours as needed for wheezing  1 Inhaler 0  . aspirin EC 81 MG EC tablet Take 1 tablet (81 mg total) by mouth daily.    . bisoprolol (ZEBETA) 10 MG tablet Take 1 tablet (10 mg total) by mouth daily. 30 tablet 6  . ferrous gluconate (FERGON) 324 MG tablet Take 1 tablet (324 mg total) by mouth 2 (two) times daily with a meal.    . furosemide (LASIX) 20 MG tablet Take 1 tablet (20 mg total) by mouth daily. 30 tablet 6  . losartan (COZAAR) 50 MG tablet Take 1 tablet (50 mg total) by mouth daily. 90 tablet 3  . spironolactone (ALDACTONE) 25 MG tablet Take 0.5 tablets (12.5 mg total) by mouth daily. 30 tablet 6   Family History  Problem Relation Age of Onset  . Hypertension Father   . Coronary artery disease Sister   ADOPTED  Social History   Social History  . Marital status: Married    Spouse name: N/A  . Number of children: ONE CHILD 20 YO  . Years of education: 12TH GRADE   Social History Main Topics  . Smoking status: Current Every Day Smoker    Packs/day: 0.25    Years: 37.00    Types: Cigarettes    Start date: 05/10/1975  . Smokeless tobacco: Never Used  . Alcohol use No  . Drug use: No  . 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  Review of Systems PER HPI OTHERWISE ALL SYSTEMS ARE NEGATIVE.      Objective:   Physical Exam  Constitutional: She is oriented to person, place, and time. She appears well-developed and well-nourished. No distress.  HENT:  Head: Normocephalic and atraumatic.  Mouth/Throat: Oropharynx is clear and moist. No oropharyngeal exudate.  Eyes: Pupils are equal, round, and reactive to light. No scleral icterus.  Neck: Normal range of motion. Neck supple.  Cardiovascular: Normal rate, regular rhythm and normal heart sounds.   Pulmonary/Chest: Effort normal and breath sounds normal. No respiratory distress.  Abdominal: Soft. Bowel sounds are normal. She exhibits no distension. There is no tenderness.  Musculoskeletal: She exhibits no edema.  Lymphadenopathy:    She has no cervical adenopathy.  Neurological: She is alert and oriented to person, place, and time.  NO  NEW FOCAL DEFICITS  Psychiatric: She has a normal mood and affect.  Vitals reviewed.     Assessment & Plan:

## 2016-07-07 NOTE — Discharge Instructions (Signed)
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 YOU HAVE MILD DIVERTICULOSIS IN YOUR RIGHT COLON.  THE LAST PART OF YOU SMALL BOWEL IS NORMAL. I BIOPSIED YOUR STOMACH. I APPLIED CAUTERY TO THE AVMs.   HOLD ASPIRIN FOR 2 WEEKS. RE-START OCT 20.  DRINK WATER TO KEEP YOUR URINE LIGHT YELLOW.   FOLLOW A LOW FAT/HIGH FIBER DIET. AVOID ITEMS THAT CAUSE BLOATING. SEE INFO BELOW.  CONTINUE YOUR WEIGHT LOSS EFFORTS.  CONTINUE OMEPRAZOLE.  TAKE 30 MINUTES PRIOR TO YOUR FIRST MEAL.  YOUR BIOPSY RESULTS WILL BE AVAILABLE IN MY CHART  OCT 10 AND MY OFFICE WILL CONTACT YOU IN 10-14 DAYS WITH YOUR RESULTS.   FOLLOW UP IN JAN 2018.   ENDOSCOPY Care After Read the instructions outlined below and refer to this sheet in the next week. These discharge instructions provide you with general information on caring for yourself after you leave the hospital. While your treatment has been planned according to the most current medical practices available, unavoidable complications occasionally occur. If you have any problems or questions after discharge, call DR. Ivalee Strauser, [phone].  ACTIVITY  You may resume your regular activity, but move at a slower pace for the next 24 hours.   Take frequent rest periods for the next 24 hours.   Walking will help get rid of the air and reduce the bloated feeling in your belly (abdomen).   No driving for 24 hours (because of the medicine (anesthesia) used during the test).   You may shower.   Do not sign any important legal documents or operate any machinery for 24 hours (because of the anesthesia used during the test).    NUTRITION  Drink plenty of fluids.   You may resume your normal diet as instructed by your doctor.   Begin with a light meal and progress to your normal diet. Heavy or fried foods are harder to digest and may make you feel sick to your stomach (nauseated).   Avoid alcoholic beverages for 24 hours or as instructed.     MEDICATIONS  You may resume your normal medications.   WHAT YOU CAN EXPECT TODAY  Some feelings of bloating in the abdomen.   Passage of more gas than usual.   Spotting of blood in your stool or on the toilet paper  .  IF YOU HAD POLYPS REMOVED DURING THE ENDOSCOPY:  Eat a soft diet IF YOU HAVE NAUSEA, BLOATING, ABDOMINAL PAIN, OR VOMITING.    FINDING OUT THE RESULTS OF YOUR TEST Not all test results are available during your visit. DR. Oneida Alar WILL CALL YOU WITHIN 14 DAYS OF YOUR PROCEDUE WITH YOUR RESULTS. Do not assume everything is normal if you have not heard from DR. Cassondra Stachowski, CALL HER OFFICE AT [phone].  SEEK IMMEDIATE MEDICAL ATTENTION AND CALL THE OFFICE: [phone] IF:  You have more than a spotting of blood in your stool.   Your belly is swollen (abdominal distention).   You are nauseated or vomiting.   You have a temperature over 101F.   You have abdominal pain or discomfort that is severe or gets worse throughout the day.  Arteriovenous Malformation An arteriovenous malformation (AVM) is a disorder that has been present since birth (congenital). It is characterized by a complex, tangled web of arteries and veins. An AVM may occur in the STOMACH, COLON, OR SMALL BOWEL.  SYMPTOMS  The most common problems (symptoms) of AVM include:  Bleeding (hemorrhaging).   ANEMIA (LOW BLOOD COUNT)  TREATMENT  There are three general forms of treatment for AVM: **ABLATION WITH HEAT     Gastritis  Gastritis is an inflammation (the body's way of reacting to injury and/or infection) of the stomach. It is often caused by bacterial (germ) infections. It can also be caused BY ASPIRIN, BC/GOODY POWDER'S, (IBUPROFEN) MOTRIN, OR ALEVE (NAPROXEN), chemicals (including alcohol), SPICY FOODS, and medications. This illness may be associated with generalized malaise (feeling tired, not well), UPPER ABDOMINAL STOMACH cramps, and fever. One common bacterial cause of  gastritis is an organism known as H. Pylori. This can be treated with antibiotics.    High-Fiber Diet A high-fiber diet changes your normal diet to include more whole grains, legumes, fruits, and vegetables. Changes in the diet involve replacing refined carbohydrates with unrefined foods. The calorie level of the diet is essentially unchanged. The Dietary Reference Intake (recommended amount) for adult males is 38 grams per day. For adult females, it is 25 grams per day. Pregnant and lactating women should consume 28 grams of fiber per day. Fiber is the intact part of a plant that is not broken down during digestion. Functional fiber is fiber that has been isolated from the plant to provide a beneficial effect in the body. PURPOSE  Increase stool bulk.   Ease and regulate bowel movements.   Lower cholesterol.  REDUCE RISK OF COLON CANCER  INDICATIONS THAT YOU NEED MORE FIBER  Constipation and hemorrhoids.   Uncomplicated diverticulosis (intestine condition) and irritable bowel syndrome.   Weight management.   As a protective measure against hardening of the arteries (atherosclerosis), diabetes, and cancer.   GUIDELINES FOR INCREASING FIBER IN THE DIET  Start adding fiber to the diet slowly. A gradual increase of about 5 more grams (2 slices of whole-wheat bread, 2 servings of most fruits or vegetables, or 1 bowl of high-fiber cereal) per day is best. Too rapid an increase in fiber may result in constipation, flatulence, and bloating.   Drink enough water and fluids to keep your urine clear or pale yellow. Water, juice, or caffeine-free drinks are recommended. Not drinking enough fluid may cause constipation.   Eat a variety of high-fiber foods rather than one type of fiber.   Try to increase your intake of fiber through using high-fiber foods rather than fiber pills or supplements that contain small amounts of fiber.   The goal is to change the types of food eaten. Do not  supplement your present diet with high-fiber foods, but replace foods in your present diet.    INCLUDE A VARIETY OF FIBER SOURCES  Replace refined and processed grains with whole grains, canned fruits with fresh fruits, and incorporate other fiber sources. Pas[place]lli rice, [place] breads, and most bakery goods contain little or no fiber.   Brown whole-grain rice, buckwheat oats, and many fruits and vegetables are all good sources of fiber. These include: broccoli, Brussels sprouts, cabbage, cauliflower, beets, sweet potatoes, [place] potatoes (skin on), carrots, tomatoes, eggplant, squash, berries, fresh fruits, and dried fruits.   Cereals appear to be the richest source of fiber. Cereal fiber is found in whole grains and bran. Bran is the fiber-rich outer coat of cereal grain, which is largely removed in refining. In whole-grain cereals, the bran remains. In breakfast cereals, the largest amount of fiber is found in those with "bran" in their names. The fiber content is sometimes indicated on the label.   You may need to include additional fruits and vegetables each day.   In baking, for 1 cup  [place] flour, you may use the following substitutions:   1 cup whole-wheat flour minus 2 tablespoons.   1/2 cup [place] flour plus 1/2 cup whole-wheat flour.   Low-Fat Diet BREADS, CEREALS, PASTA, RICE, DRIED PEAS, AND BEANS These products are high in carbohydrates and most are low in fat. Therefore, they can be increased in the diet as substitutes for fatty foods. They too, however, contain calories and should not be eaten in excess. Cereals can be eaten for snacks as well as for breakfast.  Include foods that contain fiber (fruits, vegetables, whole grains, and legumes). Research shows that fiber may lower blood cholesterol levels, especially the water-soluble fiber found in fruits, vegetables, oat products, and legumes. FRUITS AND VEGETABLES It is good to eat fruits and vegetables. Besides being sources of  fiber, both are rich in vitamins and some minerals. They help you get the daily allowances of these nutrients. Fruits and vegetables can be used for snacks and desserts. MEATS Limit lean meat, chicken, [place], and fish to no more than 6 ounces per day. Beef, Pork, and Lamb Use lean cuts of beef, pork, and lamb. Lean cuts include:  Extra-lean ground beef.  Arm roast.  Sirloin tip.  Center-cut ham.  Round steak.  Loin chops.  Rump roast.  Tenderloin.  Trim all fat off the outside of meats before cooking. It is not necessary to severely decrease the intake of red meat, but lean choices should be made. Lean meat is rich in protein and contains a highly absorbable form of iron. Premenopausal women, in particular, should avoid reducing lean red meat because this could increase the risk for low red blood cells (iron-deficiency anemia). The organ meats, such as liver, sweetbreads, kidneys, and brain are very rich in cholesterol. They should be limited. Chicken and [place] These are good sources of protein. The fat of poultry can be reduced by removing the skin and underlying fat layers before cooking. Chicken and [place] can be substituted for lean red meat in the diet. Poultry should not be fried or covered with high-fat sauces. Fish and Shellfish Fish is a good source of protein. Shellfish contain cholesterol, but they usually are low in saturated fatty acids. The preparation of fish is important. Like chicken and [place], they should not be fried or covered with high-fat sauces. EGGS Egg whites contain no fat or cholesterol. They can be eaten often. Try 1 to 2 egg whites instead of whole eggs in recipes or use egg substitutes that do not contain yolk. MILK AND DAIRY PRODUCTS Use skim or 1% milk instead of 2% or whole milk. Decrease whole milk, natural, and processed cheeses. Use nonfat or low-fat (2%) cottage cheese or low-fat cheeses made from vegetable oils. Choose nonfat or low-fat (1 to 2%) yogurt.  Experiment with evaporated skim milk in recipes that call for heavy cream. Substitute low-fat yogurt or low-fat cottage cheese for sour cream in dips and salad dressings. Have at least 2 servings of low-fat dairy products, such as 2 glasses of skim (or 1%) milk each day to help get your daily calcium intake.  FATS AND OILS Reduce the total intake of fats, especially saturated fat. Butterfat, lard, and beef fats are high in saturated fat and cholesterol. These should be avoided as much as possible. Vegetable fats do not contain cholesterol, but certain vegetable fats, such as coconut oil, palm oil, and palm kernel oil are very high in saturated fats. These should be limited. These fats are often used in bakery  goods, processed foods, popcorn, oils, and nondairy creamers. Vegetable shortenings and some peanut butters contain hydrogenated oils, which are also saturated fats. Read the labels on these foods and check for saturated vegetable oils. Unsaturated vegetable oils and fats do not raise blood cholesterol. However, they should be limited because they are fats and are high in calories. Total fat should still be limited to 30% of your daily caloric intake. Desirable liquid vegetable oils are corn oil, cottonseed oil, olive oil, canola oil, safflower oil, soybean oil, and sunflower oil. Peanut oil is not as good, but small amounts are acceptable. Buy a heart-healthy tub margarine that has no partially hydrogenated oils in the ingredients. Mayonnaise and salad dressings often are made from unsaturated fats, but they should also be limited because of their high calorie and fat content. Seeds, nuts, peanut butter, olives, and avocados are high in fat, but the fat is mainly the unsaturated type. These foods should be limited mainly to avoid excess calories and fat. OTHER EATING TIPS Snacks  Most sweets should be limited as snacks. They tend to be rich in calories and fats, and their caloric content outweighs  their nutritional value. Some good choices in snacks are graham crackers, melba toast, soda crackers, bagels (no egg), English muffins, fruits, and vegetables. These snacks are preferable to snack crackers, [place] fries, and chips. Popcorn should be air-popped or cooked in small amounts of liquid vegetable oil. Desserts Eat fruit, low-fat yogurt, and fruit ices. AVOID pastries, cake, and cookies. Sherbet, angel food cake, gelatin dessert, frozen low-fat yogurt, or other frozen products that do not contain saturated fat (pure fruit juice bars, frozen ice pops) are also acceptable.  COOKING METHODS Choose those methods that use little or no fat. They include: Poaching.  Braising.  Steaming.  Grilling.  Baking.  Stir-frying.  Broiling.  Microwaving.  Foods can be cooked in a nonstick pan without added fat, or use a nonfat cooking spray in regular cookware. Limit fried foods and avoid frying in saturated fat. Add moisture to lean meats by using water, broth, cooking wines, and other nonfat or low-fat sauces along with the cooking methods mentioned above. Soups and stews should be chilled after cooking. The fat that forms on top after a few hours in the refrigerator should be skimmed off. When preparing meals, avoid using excess salt. Salt can contribute to raising blood pressure in some people. EATING AWAY FROM HOME Order entres, potatoes, and vegetables without sauces or butter. When meat exceeds the size of a deck of cards (3 to 4 ounces), the rest can be taken home for another meal. Choose vegetable or fruit salads and ask for low-calorie salad dressings to be served on the side. Use dressings sparingly. Limit high-fat toppings, such as bacon, crumbled eggs, cheese, sunflower seeds, and olives. Ask for heart-healthy tub margarine instead of butter.  Diverticulosis Diverticulosis is a common condition that develops when small pouches (diverticula) form in the wall of the colon OR SMALL BOWEL. The  risk of diverticulosis increases with age. It happens more often in people who eat a low-fiber diet. Most individuals with diverticulosis have no symptoms. Those individuals with symptoms usually experience belly (abdominal) pain, constipation, or loose stools (diarrhea).  HOME CARE INSTRUCTIONS Increase the amount of fiber in your diet as directed by your caregiver or dietician. This may reduce symptoms of diverticulosis.  Drink at least 6 to 8 glasses of water each day to prevent constipation.  Try not to strain when you have  a bowel movement.  Avoiding nuts and seeds to prevent complications is NOT NECESSARY.   FOODS HAVING HIGH FIBER CONTENT INCLUDE: Fruits. Apple, peach, pear, tangerine, raisins, prunes.  Vegetables. Brussels sprouts, asparagus, broccoli, cabbage, carrot, cauliflower, romaine lettuce, spinach, summer squash, tomato, winter squash, zucchini.  Starchy Vegetables. Baked beans, kidney beans, lima beans, split peas, lentils, potatoes (with skin).  Grains. Whole wheat bread, brown rice, bran flake cereal, plain oatmeal, Agent rice, shredded wheat, bran muffins.   SEEK IMMEDIATE MEDICAL CARE IF: You develop increasing pain or severe bloating.  You have an oral temperature above 101F.  You develop vomiting or bowel movements that are bloody or black.

## 2016-07-12 ENCOUNTER — Ambulatory Visit (HOSPITAL_COMMUNITY): Payer: PRIVATE HEALTH INSURANCE | Admitting: Hematology & Oncology

## 2016-07-15 NOTE — Progress Notes (Signed)
This encounter was created in error - please disregard.

## 2016-07-19 ENCOUNTER — Encounter (HOSPITAL_COMMUNITY): Payer: Self-pay | Admitting: Gastroenterology

## 2016-07-24 ENCOUNTER — Telehealth: Payer: Self-pay

## 2016-07-24 NOTE — Telephone Encounter (Signed)
Tammy Mcpherson called this morning to let us know that her husband cancled her appointment to see Dr. Arnoldo Morale

## 2016-07-24 NOTE — Telephone Encounter (Signed)
Talked with husband and he said that they will reschedule her appointment with Arnoldo Morale in the future but he soul not give a day or time.

## 2016-07-27 NOTE — Telephone Encounter (Signed)
REVIEWED-NO ADDITIONAL RECOMMENDATIONS. 

## 2016-07-28 NOTE — Telephone Encounter (Signed)
On recall  °

## 2016-07-28 NOTE — Telephone Encounter (Signed)
LMOM to call.

## 2016-07-28 NOTE — Telephone Encounter (Signed)
PLEASE CALL PT. HER RESULTS WERE RELEASED IN MY CHART. HER STOMACH BIOPSIES SHOWED GASTRITIS FROM ASPIRIN. RE-START ASPIRIN OCT 20. FOLLOW A LOW FAT/HIGH FIBER DIET.  CONTINUE OMEPRAZOLE.  TAKE 30 MINUTES PRIOR TO YOUR FIRST MEAL. FOLLOW UP IN JAN 2018 E30 ABNORMAL GALLBLADDER/GASTRITIS.

## 2016-07-31 NOTE — Telephone Encounter (Signed)
Pt's husband called and is aware.

## 2016-08-07 ENCOUNTER — Encounter: Payer: Self-pay | Admitting: Gastroenterology

## 2016-09-12 ENCOUNTER — Encounter: Payer: Self-pay | Admitting: Gastroenterology

## 2016-11-02 ENCOUNTER — Other Ambulatory Visit: Payer: Self-pay | Admitting: Adult Health

## 2016-11-13 ENCOUNTER — Other Ambulatory Visit (HOSPITAL_COMMUNITY): Payer: Self-pay | Admitting: Podiatry

## 2016-11-13 DIAGNOSIS — M79671 Pain in right foot: Secondary | ICD-10-CM

## 2016-11-13 DIAGNOSIS — I739 Peripheral vascular disease, unspecified: Secondary | ICD-10-CM

## 2016-11-13 DIAGNOSIS — M79672 Pain in left foot: Secondary | ICD-10-CM

## 2016-11-15 ENCOUNTER — Ambulatory Visit (HOSPITAL_COMMUNITY)
Admission: RE | Admit: 2016-11-15 | Discharge: 2016-11-15 | Disposition: A | Discharge status: Home / Self Care | Payer: BLUE CROSS/BLUE SHIELD | Source: Ambulatory Visit | Attending: Podiatry | Admitting: Podiatry

## 2016-11-15 DIAGNOSIS — M79671 Pain in right foot: Secondary | ICD-10-CM | POA: Diagnosis present

## 2016-11-15 DIAGNOSIS — I739 Peripheral vascular disease, unspecified: Secondary | ICD-10-CM | POA: Diagnosis present

## 2016-11-15 DIAGNOSIS — M79672 Pain in left foot: Secondary | ICD-10-CM | POA: Insufficient documentation

## 2016-11-21 ENCOUNTER — Other Ambulatory Visit: Payer: Self-pay | Admitting: Podiatry

## 2016-11-21 DIAGNOSIS — I739 Peripheral vascular disease, unspecified: Secondary | ICD-10-CM

## 2016-12-05 ENCOUNTER — Ambulatory Visit
Admission: RE | Admit: 2016-12-05 | Discharge: 2016-12-05 | Disposition: A | Discharge status: Home / Self Care | Payer: BLUE CROSS/BLUE SHIELD | Source: Ambulatory Visit | Attending: Podiatry | Admitting: Podiatry

## 2016-12-05 DIAGNOSIS — I739 Peripheral vascular disease, unspecified: Secondary | ICD-10-CM

## 2016-12-05 HISTORY — PX: IR RADIOLOGIST EVAL & MGMT: IMG5224

## 2016-12-05 NOTE — Consult Note (Signed)
Chief Complaint: Foot pain and toe color changes.   Referring Physician(s): McKinney,Benjamin  History of Present Illness: Tammy Mcpherson is a 61 y.o. female presenting as a scheduled appointment [facility] as a scheduled outpatient, kindly referred by Dr. [name], for evaluation of right great toe pain.   The patient reports that the pain has been present for several months now.  She has never had a non-healing wound.  The pain is not present at night and does not wake her.  The pain is typically worst when she bumps the toe or manipulates the toe.    She does not get routine health care, with no routine primary physician.  She tells me she gets care at the [facility], but she has not had an appointment in over 3 years.  The last appointment was before she had surgical correction of her right foot with Dr. [name].   Her known cardiovascular risk factors include: smoking (since 61yo), HTN.  She does not know of any cholesterol problems, and she is not treated for DM.    She was hospitalized in 2017 for episode of CHF, though tells me she has no definitive history of heart attack.    Non-invasive study of the bilateral lower extremities was performed 11/15/2016.  This shows normal range ABI, with unremarkable doppler signal to the lower calf/ankle bilateral.  The toe waveforms/PPG's suggest distal small vessel disease.    Past Medical History:  Diagnosis Date  . Anemia 04/30/2016  . Cardiomyopathy [facility])    Diagnosed July 2017  . Combined congestive systolic and diastolic heart failure ([place])   . COPD (chronic obstructive pulmonary disease) ([place])    a. suspected COPD.  Marland Kitchen Coronary artery calcification seen on CT scan   . Essential hypertension   . History of bronchitis   . Iron deficiency anemia 05/12/2016  . PSVT (paroxysmal supraventricular tachycardia) ([place])    a. first noted on tele during adm 7-05/2016.  Marland Kitchen PVC's  (premature ventricular contractions)    a. first noted on tele during adm 7-05/2016.  Marland Kitchen Thrombocytosis ([place])     Past Surgical History:  Procedure Laterality Date  . ABDOMINAL HYSTERECTOMY    . Barbie Banner OSTEOTOMY Right 07/10/2013   Procedure: Barbie Banner OSTEOTOMY RIGHT FOOT;  Surgeon: Marcheta Grammes, DPM;  Location: [facility];  Service: Orthopedics;  Laterality: Right;  . BUNIONECTOMY Right 07/10/2013   Procedure: VOGLER BUNIONECTOMY RIGHT FOOT;  Surgeon: Marcheta Grammes, DPM;  Location: [facility];  Service: Orthopedics;  Laterality: Right;  . COLONOSCOPY N/A 07/07/2016   Procedure: COLONOSCOPY;  Surgeon: Danie Binder, MD;  Location: [facility] SU[facility];  Service: Endoscopy;  Laterality: N/A;  2:00 pm - moved to 10:30 - office notified pt  . ESOPHAGOGASTRODUODENOSCOPY N/A 07/07/2016   Procedure: ESOPHAGOGASTRODUODENOSCOPY (EGD);  Surgeon: Danie Binder, MD;  Location: [facility];  Service: Endoscopy;  Laterality: N/A;  . METATARSAL HEAD EXCISION Right 07/10/2013   Procedure: METATARSAL HEAD RESECTION OF DIGITS 2 AND 3 RIGHT FOOT;  Surgeon: Marcheta Grammes, DPM;  Location: [facility] ORS;  Service: Orthopedics;  Laterality: Right;  . PROXIMAL INTERPHALANGEAL FUSION (PIP) Right 07/10/2013   Procedure: ARTHRODESIS PIPJ  2ND DIGIT RIGHT FOOT;  Surgeon: Marcheta Grammes, DPM;  Location: [facility] OR[facility];  Service: Orthopedics;  Laterality: Right;    Allergies: Codeine; Penicillins; Bee venom; and Sudafed [pseudoephedrine hcl]  Medications: Prior to Admission medications   Medication Sig Start Date End Date  Taking? Authorizing Provider  aspirin 325 MG tablet Take 325 mg by mouth daily.   Yes Historical Provider, MD  bisoprolol (ZEBETA) 10 MG tablet Take 1 tablet (10 mg total) by mouth daily. 06/06/16  Yes Lendon Colonel, NP  furosemide (LASIX) 20 MG tablet Take 1 tablet (20 mg total) by mouth daily. 06/06/16  Yes Lendon Colonel, NP  losartan (COZAAR) 50 MG tablet Take 50 mg by mouth daily.   Yes  Historical Provider, MD  meloxicam (MOBIC) 7.5 MG tablet Take 7.5 mg by mouth daily.   Yes Historical Provider, MD  omeprazole (PRILOSEC) 20 MG capsule 1 po every morning 30 minutes prior to your first meal. 06/22/16  Yes Danie Binder, MD  PROAIR HFA 108 (90 Base) MCG/ACT inhaler INHALE 2 PUFFS INTO THE LUNGS EVERY 6 HOURS AS NEEDED FOR WHEEZING ORSHORTNESS OF BREATH. 11/03/16  Yes Lendon Colonel, NP  spironolactone (ALDACTONE) 25 MG tablet Take 0.5 tablets (12.5 mg total) by mouth daily. 06/06/16  Yes Lendon Colonel, NP  ferrous gluconate (FERGON) 324 MG tablet Take 1 tablet (324 mg total) by mouth 2 (two) times daily with a meal. Patient not taking: Reported on 12/05/2016 05/03/16   Samuella Cota, MD  losartan (COZAAR) 50 MG tablet Take 1 tablet (50 mg total) by mouth daily. 06/21/16 09/19/16  Lendon Colonel, NP     Family History  Problem Relation Age of Onset  . Hypertension Father   . Coronary artery disease Sister     Social History   Social History  . Marital status: Married    Spouse name: N/A  . Number of children: N/A  . Years of education: N/A   Social History Main Topics  . Smoking status: Current Every Day Smoker    Packs/day: 0.25    Years: 37.00    Types: Cigarettes    Start date: 05/10/1975  . Smokeless tobacco: Never Used  . Alcohol use No  . Drug use: No  . Sexual activity: Yes    Birth control/ protection: Surgical   Other Topics Concern  . Not on file   Social History Narrative  . No narrative on file       Review of Systems: A 12 point ROS discussed and pertinent positives are indicated in the HPI above.  All other systems are negative.  Review of Systems  Vital Signs: BP (!) 195/87 (BP Location: Left Arm, Patient Position: Sitting, Cuff Size: Normal)   Pulse 86   Temp 98.7 F (37.1 C) (Oral)   Resp 14   Ht 5\' 5"  (1.651 m)   Wt 138 lb (62.6 kg)   SpO2 97%   BMI 22.96 kg/m   Physical Exam  General: 61yo appearing older than  stated age.  Well-developed, well-nourished.  No distress. HEENT: Atraumatic, normocephalic.  Conjugate gaze, extra-ocular motor intact. No scleral icterus or scleral injection. No lesions on external ears, nose, lips, or gums.  Oral mucosa moist, pink.  Neck: Symmetric with no goiter enlargement.  Chest/Lungs:  Symmetric chest with inspiration/expiration.  No labored breathing.  Clear to auscultation with no wheezes, rhonchi, or rales.  Heart:  RRR, with no third heart sounds appreciated. No JVD appreciated.  Abdomen:  Soft, NT/ND, with + bowel sounds.   Genito-urinary: Deferred Neurologic: Alert & Oriented to person, place, and time.   Normal affect and insight.  Appropriate questions.  Moving all 4 extremities with gross sensory intact.  Pulse Exam:   Non palpable DP &  PT on the right and left.   Doppler positive.   No open wound. Purplish discoloration of toes, most pronounced at the first and second toes on the right.   Mallampati Score:  3  Imaging: US Arterial Seg Multiple  Result Date: 11/15/2016 CLINICAL DATA:  61 year old female with a history of left first toe pain and discoloration. Cardiovascular risk factors include smoking, hypertension EXAM: NONINVASIVE PHYSIOLOGIC VASCULAR STUDY OF BILATERAL LOWER EXTREMITIES TECHNIQUE: Evaluation of both lower extremities was performed at rest, including calculation of ankle-brachial indices, multiple segmental pressure evaluation, segmental Doppler and segmental pulse volume recording. COMPARISON:  No prior noninvasive FINDINGS: Right: Resting ankle brachial index:  0.99 Segmental blood pressure: Symmetric upper extremity pressures. Appropriate increased to the thigh. No significant drop within the leg segments. Doppler: Segmental Doppler of the right lower extremity demonstrates triphasic waveform throughout. Pulse volume recording: Segmental PVR of the right lower extremity demonstrates maintained amplitude and waveform with loss of  augmentation from thigh to below knee. Waveform maintained at the ankle. Toe PPG: Flat line signal of the first second toes, with compromise of the waveform in the fourth digit and fifth digit. Left: Resting ankle brachial index: 0.89 Segmental blood pressure: Symmetric upper extremity pressures. Appropriate increased to the thigh. No significant drop of the leg pressures. Doppler: Segmental Doppler of the left lower extremity demonstrates biphasic waveform of the CFA, SFA, and popliteal artery. Monophasic waveform of the posterior tibial artery and biphasic dorsalis pedis. Pulse volume recording: Segmental PVR of the left lower extremity demonstrates amplitude maintained in the thigh with loss of augmentation from high thigh to below knee. Toe PPG: Abnormal waveform in the second, third digit. Maintained waveform and first fourth and fifth digits. IMPRESSION: Right: Resting ankle-brachial within normal limits, with no evidence of significant arterial occlusive disease to the level of the ankle. The toe PPG waveforms are flat lined in the first, second toes with compromise of the waveform in the fourth and fifth digit. Uncertain if this represents small vessel disease such as that seen with diabetes, embolization to the pedal arteries/toes, or artifact. Left: Resting ankle-brachial index within the mild range of arterial occlusive disease. This value may decrease after exercise given that there appears to be proximal iliac disease. The waveforms of the left lower extremity suggest developing femoral popliteal disease and tibial disease. Abnormal toe PPG waveform of the second and third digits, which may represent developing small vessel disease such as that seen with diabetes, embolization, or potentially artifact. Signed, Dulcy Fanny. Earleen Newport, DO [facility] Radiology Electronically Signed   By: Corrie Mckusick D.O.   On: 11/15/2016 15:24    Labs:  CBC:  Recent  Labs  05/03/16 0533 05/09/16 1027 05/31/16 0816 06/28/16 1155  WBC 19.2* 25.9* 9.3 10.8*  HGB 9.3* 11.0* 13.0 14.1  HCT 30.9* 37.2 41.5 43.3  PLT 567* 802* 384 387    COAGS: No results for input(s): INR, APTT in the last 8760 hours.  BMP:  Recent Labs  05/03/16 0533 05/09/16 1027 05/26/16 1202 05/31/16 0816 06/28/16 1155  NA 140 136 137 134* 137  K 3.9 5.0 4.1 4.0 3.7  CL 102 98 104 104 104  CO2 30 24 23 24 26   GLUCOSE 103* 76 140* 152* 140*  BUN 37* 18 14 14 9   CALCIUM 8.8* 9.3 9.4 8.9 9.3  CREATININE 1.08* 0.92 0.89 0.94 0.77  GFRNONAA 55*  --  >60 >60 >60  GFRAA >60  --  >60 >  60 >60    LIVER FUNCTION TESTS:  Recent Labs  05/09/16 1027 05/31/16 0816 06/28/16 1155  BILITOT 0.2 0.2* 0.3  AST 10 23 20   ALT 12 21 17   ALKPHOS 69 81 75  PROT 6.5 7.2 7.2  ALBUMIN 3.7 3.8 3.8    TUMOR MARKERS:  Recent Labs  06/28/16 1155  CA199 9    Assessment and Plan:  Ms Cerezo is 61 year old female with right great toe pain, tissue color changes, and CV risk factors that are most suggestive of small vessel disease/atherosclerotic disease.    Non-invasive lower extremity exam shows [place] that although the ABI are within normal limits and doppler signals are relatively normal to the ankles, there is evidence of small vessel/distal disease worst on the right.  The differential would include routine athero in small vessel distribution, embolic disease ("blue-toe syndrome"), or potentially entity such as Buerger's disease.    I discussed atherosclerotic disease regarding anatomy, pathology/pathophysiology, natural history, and prognosis of PAD/CLI, as well as treatment strategies  include medical management,  And/or potential surgical or endovascular options, with risk/benefit discussion.   I did discuss the atypical presentation, and that a diagnostic angiogram or non-invasive study such as MRA or CTA would be useful.  She is concerned about the financial impact of any  angiogram, CT or MR, and did not want to pursue that at this time.   I encouraged healthy foot care including daily foot inspection, good nail care, avoiding barefoot walking, properly fitted footwear, seeking care with problems, and consideration of initiating routine podiatric care with at least annual inspection3.     Regarding medical management, maximal medical therapy for reduction of risk factors is indicated as recommended by updated AHA guidelines1.  This includes anti-platelet medication, tight blood glucose control to a HbA1c < 7, tight blood pressure control, maximum-dose HMG-CoA reductase inhibitor, and smoking cessation.  Smoking cessation was addressed, with strategies including counseling and nicotine replacement. She tells me that she is attempting to quit.   Annual flu vaccination is also recommended, with Class 1 recommendation1.   Plan: - She would like to defer angiogram at this time, and will discuss with her family.  If she would like to pursue the test or alternative such as CTA/MRA, she will contact the office.  - 3 month follow up check in our office to see progression - observe future appointments with her podiatry care. - recommend establishing routine primary care -Recommend maximal medical therapy for cardiovascular risk reduction, including anti-platelet therapy and she would likely benefit from HMG-CoA reductase inhibitor. -Recommend initiating smoking cessation measures, with options discussed.   Signed,  Dulcy Fanny. Earleen Newport, DO      1Morley Kos MD, et al. 2016 AHA/ACC Guideline on the Management of Patients With Lower Extremity Peripheral Artery Disease: Executive Summary: A Report of the American College of Cardiology/American Heart Association Task Force on Clinical Practice Guidelines. J Am Coll Cardiol. 2017 Mar 21;69(11):1465-1508. doi: 10.1016/j.jacc.2016.11.008.   2 - Norgren L, et al. TASC II Working Group. Inter-society consensus for the  management of peripheral arterial disease. Int Tressia Miners. 2007 Jun;26(2):81-157. Review. PubMed PMID: [id number]  3 - Hingorani A, et al. The management of diabetic foot: A clinical practice guideline by the Society for Vascular Surgery in collaboration with the [place] and the Society  for Vascular Medicine. J Vasc Surg. 2016 Feb;63(2 Suppl):3S-21S. doi: 10.1016/j.jvs.2015.10.003. PubMed PMID: [id number].  Thank you for this interesting consult.  I greatly enjoyed meeting Maybeth  K Kurkowski and look forward to participating in their care.  A copy of this report was sent to the requesting provider on this date.  Electronically Signed: Corrie Mckusick 12/05/2016, 6:21 PM   I spent a total of  40 Minutes   in face to face in clinical consultation, greater than 50% of which was counseling/coordinating care for PAD, right great toe, likely CLI, possible angiogram.

## 2017-01-01 ENCOUNTER — Ambulatory Visit (INDEPENDENT_AMBULATORY_CARE_PROVIDER_SITE_OTHER): Payer: BLUE CROSS/BLUE SHIELD | Admitting: Family Medicine

## 2017-01-01 ENCOUNTER — Encounter: Payer: Self-pay | Admitting: Family Medicine

## 2017-01-01 VITALS — BP 200/80 | HR 88 | Temp 97.8°F | Resp 20 | Ht 65.0 in | Wt 150.1 lb

## 2017-01-01 DIAGNOSIS — K250 Acute gastric ulcer with hemorrhage: Secondary | ICD-10-CM | POA: Diagnosis not present

## 2017-01-01 DIAGNOSIS — Z7689 Persons encountering health services in other specified circumstances: Secondary | ICD-10-CM | POA: Diagnosis not present

## 2017-01-01 DIAGNOSIS — R0609 Other forms of dyspnea: Secondary | ICD-10-CM | POA: Diagnosis not present

## 2017-01-01 DIAGNOSIS — I700 Atherosclerosis of aorta: Secondary | ICD-10-CM | POA: Diagnosis not present

## 2017-01-01 DIAGNOSIS — E785 Hyperlipidemia, unspecified: Secondary | ICD-10-CM | POA: Insufficient documentation

## 2017-01-01 DIAGNOSIS — K259 Gastric ulcer, unspecified as acute or chronic, without hemorrhage or perforation: Secondary | ICD-10-CM | POA: Insufficient documentation

## 2017-01-01 LAB — CBC
HCT: 38.4 % (ref 35.0–45.0)
HEMOGLOBIN: 12.2 g/dL (ref 11.7–15.5)
MCH: 28.2 pg (ref 27.0–33.0)
MCHC: 31.8 g/dL — AB (ref 32.0–36.0)
MCV: 88.7 fL (ref 80.0–100.0)
MPV: 10.1 fL (ref 7.5–12.5)
Platelets: 500 10*3/uL — ABNORMAL HIGH (ref 140–400)
RBC: 4.33 MIL/uL (ref 3.80–5.10)
RDW: 14 % (ref 11.0–15.0)
WBC: 11.6 10*3/uL — AB (ref 3.8–10.8)

## 2017-01-01 MED ORDER — ATORVASTATIN CALCIUM 20 MG PO TABS: 20.0000 mg | ORAL_TABLET | Freq: Every day | ORAL | 3 refills | Status: DC

## 2017-01-01 MED ORDER — LOSARTAN POTASSIUM 100 MG PO TABS: 100.0000 mg | ORAL_TABLET | Freq: Every day | ORAL | 3 refills | Status: DC

## 2017-01-01 MED ORDER — BUPROPION HCL ER (SR) 100 MG PO TB12: 100.0000 mg | ORAL_TABLET | Freq: Two times a day (BID) | ORAL | 1 refills | Status: DC

## 2017-01-01 NOTE — Progress Notes (Signed)
Chief Complaint  Patient presents with  . Establish Care   New to establish I have her cardiology notes, also some GI notes and testing. She is behind on primary care testing and prevention. Does not need s PAP due to hysterectomy status. Colonoscopy 2017 unknown Dexa and mammogram Refuses flu shots, pneumonia shot.  Thinks she has had a tetanus.  Will get records. She gets no exercise She is not trying to eat a heart healthy diet I have discussed the multiple health risks associated with cigarette smoking including, but not limited to, cardiovascular disease, lung disease and cancer.  I have strongly recommended that smoking be stopped.  I have reviewed the various methods of quitting including cold [place], classes, nicotine replacements and prescription medications.  I have offered assistance in this difficult process.  We had a strong conversation about her atherosclerosis, her CAD, her PVD and her COPD - and that she cannot ever expect to feel better as long as she smokes.  She will try a Rx for wellbutrin.  She had iron def anemia - and was found to have bleeding ulcers. She seems unaware that she was not supposed to take NSAIDs and still takes ibuprofen.  Recently prescribed mobic by her podiatrist.  I told her NOT to take any of these drugs, and to take her aspirin with food.  She is overdue for follow up EGD and I made her aware of 2 letters in the chart from Dr Oneida Alar.  For unknown reasons has never been given a statin.  BP is out of control and I am doubling her cozaar   Patient Active Problem List   Diagnosis Date Noted  . Aortic atherosclerosis ([place]) 01/01/2017  . Gastric ulcer 01/01/2017  . HLD (hyperlipidemia) 01/01/2017  . Dyspnea on effort 01/01/2017  . 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 Anemia, iron deficiency 05/12/2016  . Cardiomyopathy- etiology not yet determined 05/02/2016  . CAD-Ca++ coronaries on CTA 05/02/2016  . Acute combined systolic and diastolic heart  failure ([place]) 04/30/2016  . COPD exacerbation ([place]) 04/30/2016  . Essential hypertension 04/30/2016  . Tobacco use disorder 04/30/2016    Outpatient Encounter Prescriptions as of 01/01/2017  Medication Sig  . aspirin 325 MG tablet Take 325 mg by mouth daily.  . bisoprolol (ZEBETA) 10 MG tablet Take 1 tablet (10 mg total) by mouth daily.  . ferrous gluconate (FERGON) 324 MG tablet Take 1 tablet (324 mg total) by mouth 2 (two) times daily with a meal.  . furosemide (LASIX) 20 MG tablet Take 1 tablet (20 mg total) by mouth daily.  Marland Kitchen omeprazole (PRILOSEC) 20 MG capsule 1 po every morning 30 minutes prior to your first meal.  . PROAIR HFA 108 (90 Base) MCG/ACT inhaler INHALE 2 PUFFS INTO THE LUNGS EVERY 6 HOURS AS NEEDED FOR WHEEZING ORSHORTNESS OF BREATH.  Marland Kitchen spironolactone (ALDACTONE) 25 MG tablet Take 0.5 tablets (12.5 mg total) by mouth daily.  Marland Kitchen atorvastatin (LIPITOR) 20 MG tablet Take 1 tablet (20 mg total) by mouth daily.  Marland Kitchen buPROPion (WELLBUTRIN SR) 100 MG 12 hr tablet Take 1 tablet (100 mg total) by mouth 2 (two) times daily.  Marland Kitchen losartan (COZAAR) 100 MG tablet Take 1 tablet (100 mg total) by mouth daily.   No facility-administered encounter medications on file as of 01/01/2017.     Past Medical History:  Diagnosis Date  . Allergy    pollen  . Anemia 04/30/2016  . Cardiomyopathy [facility])    Diagnosed July 2017  .  Combined congestive systolic and diastolic heart failure ([place])   . COPD (chronic obstructive pulmonary disease) ([place])    a. suspected COPD.  Marland Kitchen Coronary artery calcification seen on CT scan   . Essential hypertension   . GERD (gastroesophageal reflux disease)   . History of bronchitis   . Hyperlipidemia   . Iron deficiency anemia 05/12/2016   bleeding ulcers  . PSVT (paroxysmal supraventricular tachycardia) ([place])    a. first noted on tele during adm 7-05/2016.  Marland Kitchen PVC's (premature ventricular contractions)    a. first noted on tele during adm 7-05/2016.  . Substance abuse     . Thrombocytosis ([place])   . Ulcer [facility])     Past Surgical History:  Procedure Laterality Date  . ABDOMINAL HYSTERECTOMY     polyps  about age 6  . Barbie Banner OSTEOTOMY Right 07/10/2013   Procedure: Barbie Banner OSTEOTOMY RIGHT FOOT;  Surgeon: Marcheta Grammes, DPM;  Location: [facility] OR[facility];  Service: Orthopedics;  Laterality: Right;  . BUNIONECTOMY Right 07/10/2013   Procedure: VOGLER BUNIONECTOMY RIGHT FOOT;  Surgeon: Marcheta Grammes, DPM;  Location: [facility] ORS;  Service: Orthopedics;  Laterality: Right;  . COLONOSCOPY N/A 07/07/2016   Procedure: COLONOSCOPY;  Surgeon: Danie Binder, MD;  Location: [facility] SU[facility];  Service: Endoscopy;  Laterality: N/A;  2:00 pm - moved to 10:30 - office notified pt  . ESOPHAGOGASTRODUODENOSCOPY N/A 07/07/2016   Procedure: ESOPHAGOGASTRODUODENOSCOPY (EGD);  Surgeon: Danie Binder, MD;  Location: [facility] SU[facility];  Service: Endoscopy;  Laterality: N/A;  . METATARSAL HEAD EXCISION Right 07/10/2013   Procedure: METATARSAL HEAD RESECTION OF DIGITS 2 AND 3 RIGHT FOOT;  Surgeon: Marcheta Grammes, DPM;  Location: [facility] ORS;  Service: Orthopedics;  Laterality: Right;  . PROXIMAL INTERPHALANGEAL FUSION (PIP) Right 07/10/2013   Procedure: ARTHRODESIS PIPJ  2ND DIGIT RIGHT FOOT;  Surgeon: Marcheta Grammes, DPM;  Location: [facility] ORS;  Service: Orthopedics;  Laterality: Right;    Social History   Social History  . Marital status: Married    Spouse name: [name]  . Number of children: 1  . Years of education: 30   Occupational History  . disabled     heart   Social History Main Topics  . Smoking status: Current Every Day Smoker    Packs/day: 0.25    Years: 37.00    Types: Cigarettes    Start date: 05/10/1975  . Smokeless tobacco: Never Used  . Alcohol use No  . Drug use: No  . Sexual activity: Yes    Birth control/ protection: Surgical   Other Topics Concern  . Not on file   Social History Narrative   Disabled   Lives with husband [name]   Also helping mother in  law and her friend   Two dogs    Family History  Problem Relation Age of Onset  . Adopted: Yes  . Hypertension Father   . Coronary artery disease Sister     Review of Systems  Constitutional: Positive for malaise/fatigue. Negative for weight loss.  HENT: Negative for congestion and hearing loss.   Eyes: Negative for blurred vision and redness.  Respiratory: Positive for cough and shortness of breath.        Dyspnea with exertion and when lies down at night  Cardiovascular: Negative for chest pain, palpitations and leg swelling.  Gastrointestinal: Negative for blood in stool, constipation and diarrhea.  Genitourinary: Negative for dysuria and urgency.  Musculoskeletal: Positive for back pain, joint pain, myalgias and neck pain.  Skin: Negative for itching and rash.  Neurological: Negative for dizziness and headaches.  Psychiatric/Behavioral: Negative for substance abuse. The patient is nervous/anxious. The patient does not have insomnia.        "nerves" bad, keeps her from stopping tobacco    BP (!) 200/80   Pulse 88   Temp 97.8 F (36.6 C) (Temporal)   Resp 20   Ht 5\' 5"  (1.651 m)   Wt 150 lb 1.9 oz (68.1 kg)   SpO2 94%   BMI 24.98 kg/m   Physical Exam  Constitutional: She is oriented to person, place, and time. She appears well-developed and well-nourished.  Overweight.  Speech impediment.  HENT:  Head: Normocephalic and atraumatic.  Right Ear: External ear normal.  Left Ear: External ear normal.  Mouth/Throat: Oropharynx is clear and moist.  Denture plates  Eyes: Conjunctivae are normal. Pupils are equal, round, and reactive to light.  Neck: Normal range of motion. Neck supple. No thyromegaly present.  Cardiovascular: Normal rate, regular rhythm and normal heart sounds.   Pulmonary/Chest: Effort normal and breath sounds normal. No respiratory distress.  Abdominal: Soft. Bowel sounds are normal.  Musculoskeletal: Normal range of motion. She exhibits no edema.    Lymphadenopathy:    She has no cervical adenopathy.  Neurological: She is alert and oriented to person, place, and time.  Gait normal  Skin: Skin is warm and dry.  Psychiatric: She has a normal mood and affect. Her behavior is normal. Thought content normal.  Poor fund knowledge   Nursing note and vitals reviewed.   1. Aortic atherosclerosis ([place]) On x ray  2. Acute gastric ulcer with hemorrhage On PPI, but taking NSAID  3. Hyperlipidemia, unspecified hyperlipidemia type  start atorvastatin  4. Dyspnea on effort STOP SMOKING  5. Encounter to establish care with new doctor Come back for PE   Patient Instructions  Walk every day that you are able Continue heart healthy diet Increase the cozaar to 100 mg a day Come back in 2 weeks for a physical Take the atorvastatin once a day to reduce cholesterol  You NEED to quit smoking This is not an option, it is essential for your health Start wellbutrin once a day In one week take twice a day morning and night Once you have been on the wellbutrin for a week or two STOP SMOKING  DO NOT TAKE ANY MOTRIN,IBUPROFEN, MELOXICAM ,OR NAPROXEN Only take arthritis strength tylenol-acetaminophen for pain Make sure you take your aspirin with food  YOU NEED TO CALL Dr Oneida Alar office They have sent you 2 letters to re schedule your upper endoscopy     Raylene Everts, MD

## 2017-01-01 NOTE — Patient Instructions (Signed)
Walk every day that you are able Continue heart healthy diet Increase the cozaar to 100 mg a day Come back in 2 weeks for a physical Take the atorvastatin once a day to reduce cholesterol  You NEED to quit smoking This is not an option, it is essential for your health Start wellbutrin once a day In one week take twice a day morning and night Once you have been on the wellbutrin for a week or two STOP SMOKING  DO NOT TAKE ANY MOTRIN,IBUPROFEN, MELOXICAM ,OR NAPROXEN Only take arthritis strength tylenol-acetaminophen for pain Make sure you take your aspirin with food  YOU NEED TO CALL Dr Oneida Alar office They have sent you 2 letters to re schedule your upper endoscopy

## 2017-01-02 LAB — COMPREHENSIVE METABOLIC PANEL
ALBUMIN: 3.8 g/dL (ref 3.6–5.1)
ALT: 15 U/L (ref 6–29)
AST: 14 U/L (ref 10–35)
Alkaline Phosphatase: 102 U/L (ref 33–130)
BILIRUBIN TOTAL: 0.2 mg/dL (ref 0.2–1.2)
BUN: 11 mg/dL (ref 7–25)
CO2: 25 mmol/L (ref 20–31)
CREATININE: 0.69 mg/dL (ref 0.50–1.05)
Calcium: 9.6 mg/dL (ref 8.6–10.4)
Chloride: 107 mmol/L (ref 98–110)
Glucose, Bld: 115 mg/dL — ABNORMAL HIGH (ref 65–99)
Potassium: 4.6 mmol/L (ref 3.5–5.3)
SODIUM: 142 mmol/L (ref 135–146)
Total Protein: 6.7 g/dL (ref 6.1–8.1)

## 2017-01-02 LAB — URINALYSIS, ROUTINE W REFLEX MICROSCOPIC
BILIRUBIN URINE: NEGATIVE
Glucose, UA: NEGATIVE
Hgb urine dipstick: NEGATIVE
KETONES UR: NEGATIVE
Leukocytes, UA: NEGATIVE
Nitrite: NEGATIVE
PROTEIN: NEGATIVE
Specific Gravity, Urine: 1.009 (ref 1.001–1.035)
pH: 6.5 (ref 5.0–8.0)

## 2017-01-02 LAB — LIPID PANEL
CHOL/HDL RATIO: 6.8 ratio — AB (ref <5.0)
Cholesterol: 223 mg/dL — ABNORMAL HIGH (ref <200)
HDL: 33 mg/dL — ABNORMAL LOW (ref >50)
LDL Cholesterol: 135 mg/dL — ABNORMAL HIGH (ref <100)
Triglycerides: 273 mg/dL — ABNORMAL HIGH (ref <150)
VLDL: 55 mg/dL — AB (ref <30)

## 2017-01-02 LAB — VITAMIN D 25 HYDROXY (VIT D DEFICIENCY, FRACTURES): Vit D, 25-Hydroxy: 12 ng/mL — ABNORMAL LOW (ref 30–100)

## 2017-01-02 LAB — HEMOGLOBIN A1C
Hgb A1c MFr Bld: 5.5 % (ref <5.7)
MEAN PLASMA GLUCOSE: 111 mg/dL

## 2017-01-09 ENCOUNTER — Encounter: Payer: Self-pay | Admitting: Family Medicine

## 2017-01-15 ENCOUNTER — Ambulatory Visit (INDEPENDENT_AMBULATORY_CARE_PROVIDER_SITE_OTHER): Payer: BLUE CROSS/BLUE SHIELD | Admitting: Family Medicine

## 2017-01-15 ENCOUNTER — Encounter: Payer: Self-pay | Admitting: Family Medicine

## 2017-01-15 VITALS — BP 178/86 | HR 76 | Temp 98.0°F | Resp 18 | Ht 65.0 in | Wt 152.1 lb

## 2017-01-15 DIAGNOSIS — E559 Vitamin D deficiency, unspecified: Secondary | ICD-10-CM

## 2017-01-15 DIAGNOSIS — I739 Peripheral vascular disease, unspecified: Secondary | ICD-10-CM | POA: Diagnosis not present

## 2017-01-15 DIAGNOSIS — F17200 Nicotine dependence, unspecified, uncomplicated: Secondary | ICD-10-CM | POA: Diagnosis not present

## 2017-01-15 DIAGNOSIS — E785 Hyperlipidemia, unspecified: Secondary | ICD-10-CM | POA: Diagnosis not present

## 2017-01-15 DIAGNOSIS — Z0000 Encounter for general adult medical examination without abnormal findings: Secondary | ICD-10-CM | POA: Diagnosis not present

## 2017-01-15 MED ORDER — ALPRAZOLAM 0.5 MG PO TABS: 0.2500 mg | ORAL_TABLET | Freq: Two times a day (BID) | ORAL | 0 refills | Status: DC | PRN

## 2017-01-15 MED ORDER — VITAMIN D (ERGOCALCIFEROL) 1.25 MG (50000 UNIT) PO CAPS: 50000.0000 [IU] | ORAL_CAPSULE | ORAL | 0 refills | Status: DC

## 2017-01-15 NOTE — Progress Notes (Signed)
Chief Complaint  Patient presents with  . Annual Exam   Here for PE Needs form filled in for insurance Still refuses mammogram and dexa Refuses immunizations Labs from March reviewed Vitamin D deficiency Hyperlipidemia treated No new complaints  Is trying to quit smoking.  took one wellbutrin but felt "funny"  Her sister gave her a xanax and a half pill made her better.  We discussed that I will give her limited xanax, only 30 d, not to be refilled.  It is habit forming and needs to be limited for her safety. Has not had a cig in 2 days and is doing well except is irritable with husband.  Patient Active Problem List   Diagnosis Date Noted  . Vitamin D deficiency 01/15/2017  . Aortic atherosclerosis ([place]) 01/01/2017  . Gastric ulcer 01/01/2017  . HLD (hyperlipidemia) 01/01/2017  . Dyspnea on effort 01/01/2017  . 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 Anemia, iron deficiency 05/12/2016  . Cardiomyopathy- etiology not yet determined 05/02/2016  . CAD-Ca++ coronaries on CTA 05/02/2016  . Acute combined systolic and diastolic heart failure ([place]) 04/30/2016  . COPD exacerbation ([place]) 04/30/2016  . Essential hypertension 04/30/2016  . Tobacco use disorder 04/30/2016    Outpatient Encounter Prescriptions as of 01/15/2017  Medication Sig  . aspirin 325 MG tablet Take 325 mg by mouth daily.  Marland Kitchen atorvastatin (LIPITOR) 20 MG tablet Take 1 tablet (20 mg total) by mouth daily.  . bisoprolol (ZEBETA) 10 MG tablet Take 1 tablet (10 mg total) by mouth daily.  . ferrous gluconate (FERGON) 324 MG tablet Take 1 tablet (324 mg total) by mouth 2 (two) times daily with a meal.  . furosemide (LASIX) 20 MG tablet Take 1 tablet (20 mg total) by mouth daily.  Marland Kitchen losartan (COZAAR) 100 MG tablet Take 1 tablet (100 mg total) by mouth daily.  Marland Kitchen omeprazole (PRILOSEC) 20 MG capsule 1 po every morning 30 minutes prior to your first meal.  . PROAIR HFA 108 (90 Base) MCG/ACT inhaler INHALE 2 PUFFS INTO THE LUNGS  EVERY 6 HOURS AS NEEDED FOR WHEEZING ORSHORTNESS OF BREATH.  Marland Kitchen spironolactone (ALDACTONE) 25 MG tablet Take 0.5 tablets (12.5 mg total) by mouth daily.  Marland Kitchen ALPRAZolam (XANAX) 0.5 MG tablet Take 0.5 tablets (0.25 mg total) by mouth 2 (two) times daily as needed for anxiety.  Marland Kitchen buPROPion (WELLBUTRIN SR) 100 MG 12 hr tablet Take 1 tablet (100 mg total) by mouth 2 (two) times daily. (Patient not taking: Reported on 01/15/2017)  . Vitamin D, Ergocalciferol, (DRISDOL) 50000 units CAPS capsule Take 1 capsule (50,000 Units total) by mouth every 7 (seven) days.   No facility-administered encounter medications on file as of 01/15/2017.     Allergies  Allergen Reactions  . Codeine Anaphylaxis    Tongue swelled  . Penicillins Swelling and Rash    Has patient had a PCN reaction causing immediate rash, facial/tongue/throat swelling, SOB or lightheadedness with hypotension: yes Has patient had a PCN reaction causing severe rash involving mucus membranes or skin necrosis: No Has patient had a PCN reaction that required hospitalization No Has patient had a PCN reaction occurring within the last 10 years: No If all of the above answers are "NO", then may proceed with Cephalosporin use.   [place] [Pseudoephedrine Hcl] Rash    Review of Systems  Constitutional: Negative for activity change, appetite change and unexpected weight change.  HENT: Negative for congestion, postnasal drip and rhinorrhea.   Eyes: Negative for redness and  visual disturbance.  Respiratory: Positive for shortness of breath. Negative for cough.   Cardiovascular: Negative for chest pain, palpitations and leg swelling.  Gastrointestinal: Negative for abdominal pain, constipation and diarrhea.  Genitourinary: Negative for difficulty urinating, frequency and menstrual problem.  Musculoskeletal: Negative for arthralgias and back pain.  Neurological: Negative for dizziness and headaches.  Psychiatric/Behavioral: Negative for dysphoric  mood and sleep disturbance. The patient is nervous/anxious.       Physical Exam  BP (!) 178/86 (BP Location: Right Arm, Patient Position: Sitting, Cuff Size: Normal)   Pulse 76   Temp 98 F (36.7 C) (Temporal)   Resp 18   Ht 5\' 5"  (1.651 m)   Wt 152 lb 1.3 oz (69 kg)   SpO2 97%   BMI 25.31 kg/m   General Appearance:    Alert, cooperative, no distress, appears older than stated age  Head:    Normocephalic, without obvious abnormality, atraumatic  Eyes:    PERRL, conjunctiva/corneas clear, EOM's intact, fundi    benign, both eyes  Ears:    Normal TM's and external ear canals, both ears  Nose:   Nares normal, septum midline, mucosa normal, no drainage    or sinus tenderness  Throat:   Lips, mucosa, and tongue normal; edentulous and gums normal  Neck:   Supple, symmetrical, trachea midline, no adenopathy;    thyroid:  no enlargement/tenderness/nodules; no carotid   bruit   Back:     Symmetric, no curvature, ROM normal, no CVA tenderness  Lungs:     Clear to auscultation bilaterally, respirations unlabored  Chest Wall:    No tenderness or deformity   Heart:    Regular rate and rhythm, S1 and S2 normal, no murmur, rub   or gallop  Breast Exam:    No tenderness, masses, or nipple abnormality  Abdomen:     Soft, non-tender, bowel sounds active all four quadrants,    no masses, no organomegaly  Extremities:   Extremities normal, atraumatic, no cyanosis or edema  Pulses:   Absent in feet, but symmetric all extremities  Skin:   Skin color, texture, turgor normal, no rashes or lesions, skin very dry  Lymph nodes:   Cervical, supraclavicular, and axillary nodes normal  Neurologic:   Normal strength, sensation and reflexes    throughout     ASSESSMENT/PLAN:  1. Annual physical exam   2. Vitamin D deficiency - VITAMIN D 25 Hydroxy (Vit-D Deficiency, Fractures)  3. Hyperlipidemia, unspecified hyperlipidemia type - CBC - Comprehensive metabolic panel - Lipid panel -  Urinalysis, Routine w reflex microscopic  4. Tobacco use disorder  5. PAD   Patient Instructions  Take the xanax as needed for anxiety This is habit forming and will not be refilled It is intended to help you quit smoking congratulations on quitting - keep up the good work Need follow up and blood work in 3 months  Take the vitamin D 50000 u once a week for 12 weeks After this take 2000 u a day     DASH Eating Plan DASH stands for "Dietary Approaches to Stop Hypertension." The DASH eating plan is a healthy eating plan that has been shown to reduce high blood pressure (hypertension). It may also reduce your risk for type 2 diabetes, heart disease, and stroke. The DASH eating plan may also help with weight loss. What are tips for following this plan? General guidelines   Avoid eating more than 2,300 mg (milligrams) of salt (sodium) a day. If you  have hypertension, you may need to reduce your sodium intake to 1,500 mg a day.  Limit alcohol intake to no more than 1 drink a day for nonpregnant women and 2 drinks a day for men. One drink equals 12 oz of beer, 5 oz of wine, or 1 oz of hard liquor.  Work with your health care provider to maintain a healthy body weight or to lose weight. Ask what an ideal weight is for you.  Get at least 30 minutes of exercise that causes your heart to beat faster (aerobic exercise) most days of the week. Activities may include walking, swimming, or biking.  Work with your health care provider or diet and nutrition specialist (dietitian) to adjust your eating plan to your individual calorie needs. Reading food labels   Check food labels for the amount of sodium per serving. Choose foods with less than 5 percent of the Daily Value of sodium. Generally, foods with less than 300 mg of sodium per serving fit into this eating plan.  To find whole grains, look for the word "whole" as the first word in the ingredient list. Shopping   Buy products labeled as  "low-sodium" or "no salt added."  Buy fresh foods. Avoid canned foods and premade or frozen meals. Cooking   Avoid adding salt when cooking. Use salt-free seasonings or herbs instead of table salt or sea salt. Check with your health care provider or pharmacist before using salt substitutes.  Do not fry foods. Cook foods using healthy methods such as baking, boiling, grilling, and broiling instead.  Cook with heart-healthy oils, such as olive, canola, soybean, or sunflower oil. Meal planning    Eat a balanced diet that includes:  5 or more servings of fruits and vegetables each day. At each meal, try to fill half of your plate with fruits and vegetables.  Up to 6-8 servings of whole grains each day.  Less than 6 oz of lean meat, poultry, or fish each day. A 3-oz serving of meat is about the same size as a deck of cards. One egg equals 1 oz.  2 servings of low-fat dairy each day.  A serving of nuts, seeds, or beans 5 times each week.  Heart-healthy fats. Healthy fats called Omega-3 fatty acids are found in foods such as flaxseeds and coldwater fish, like sardines, salmon, and mackerel.  Limit how much you eat of the following:  Canned or prepackaged foods.  Food that is high in trans fat, such as fried foods.  Food that is high in saturated fat, such as fatty meat.  Sweets, desserts, sugary drinks, and other foods with added sugar.  Full-fat dairy products.  Do not salt foods before eating.  Try to eat at least 2 vegetarian meals each week.  Eat more home-cooked food and less restaurant, buffet, and fast food.  When eating at a restaurant, ask that your food be prepared with less salt or no salt, if possible. What foods are recommended? The items listed may not be a complete list. Talk with your dietitian about what dietary choices are best for you. Grains  Whole-grain or whole-wheat bread. Whole-grain or whole-wheat pasta. Brown rice. [place] Morrow. Bulgur.  Whole-grain and low-sodium cereals. Pita bread. Low-fat, low-sodium crackers. Whole-wheat flour tortillas. Vegetables  Fresh or frozen vegetables (raw, steamed, roasted, or grilled). Low-sodium or reduced-sodium tomato and vegetable juice. Low-sodium or reduced-sodium tomato sauce and tomato paste. Low-sodium or reduced-sodium canned vegetables. Fruits  All fresh, dried, or frozen fruit. Canned  fruit in natural juice (without added sugar). Meat and other protein foods  Skinless chicken or [place]. Ground chicken or [place]. Pork with fat trimmed off. Fish and seafood. Egg whites. Dried beans, peas, or lentils. Unsalted nuts, nut butters, and seeds. Unsalted canned beans. Lean cuts of beef with fat trimmed off. Low-sodium, lean deli meat. Dairy  Low-fat (1%) or fat-free (skim) milk. Fat-free, low-fat, or reduced-fat cheeses. Nonfat, low-sodium ricotta or cottage cheese. Low-fat or nonfat yogurt. Low-fat, low-sodium cheese. Fats and oils  Soft margarine without trans fats. Vegetable oil. Low-fat, reduced-fat, or light mayonnaise and salad dressings (reduced-sodium). Canola, safflower, olive, soybean, and sunflower oils. Avocado. Seasoning and other foods  Herbs. Spices. Seasoning mixes without salt. Unsalted popcorn and pretzels. Fat-free sweets. What foods are not recommended? The items listed may not be a complete list. Talk with your dietitian about what dietary choices are best for you. Grains  Baked goods made with fat, such as croissants, muffins, or some breads. Dry pasta or rice meal packs. Vegetables  Creamed or fried vegetables. Vegetables in a cheese sauce. Regular canned vegetables (not low-sodium or reduced-sodium). Regular canned tomato sauce and paste (not low-sodium or reduced-sodium). Regular tomato and vegetable juice (not low-sodium or reduced-sodium). Angie Fava. Olives. Fruits  Canned fruit in a light or heavy syrup. Fried fruit. Fruit in cream or butter sauce. Meat and other  protein foods  Fatty cuts of meat. Ribs. Fried meat. Berniece Salines. Sausage. Bologna and other processed lunch meats. Salami. Fatback. Hotdogs. Bratwurst. Salted nuts and seeds. Canned beans with added salt. Canned or smoked fish. Whole eggs or egg yolks. Chicken or [place] with skin. Dairy  Whole or 2% milk, cream, and half-and-half. Whole or full-fat cream cheese. Whole-fat or sweetened yogurt. Full-fat cheese. Nondairy creamers. Whipped toppings. Processed cheese and cheese spreads. Fats and oils  Butter. Stick margarine. Lard. Shortening. Ghee. Bacon fat. Tropical oils, such as coconut, palm kernel, or palm oil. Seasoning and other foods  Salted popcorn and pretzels. Onion salt, garlic salt, seasoned salt, table salt, and sea salt. Worcestershire sauce. Tartar sauce. Barbecue sauce. Teriyaki sauce. Soy sauce, including reduced-sodium. Steak sauce. Canned and packaged gravies. Fish sauce. Oyster sauce. Cocktail sauce. Horseradish that you find on the shelf. Ketchup. Mustard. Meat flavorings and tenderizers. Bouillon cubes. Hot sauce and Tabasco sauce. Premade or packaged marinades. Premade or packaged taco seasonings. Relishes. Regular salad dressings. Where to find more information:  National Heart, Lung, and [place]: https://wilson-eaton.com/  American Heart Association: www.heart.org Summary  The DASH eating plan is a healthy eating plan that has been shown to reduce high blood pressure (hypertension). It may also reduce your risk for type 2 diabetes, heart disease, and stroke.  With the DASH eating plan, you should limit salt (sodium) intake to 2,300 mg a day. If you have hypertension, you may need to reduce your sodium intake to 1,500 mg a day.  When on the DASH eating plan, aim to eat more fresh fruits and vegetables, whole grains, lean proteins, low-fat dairy, and heart-healthy fats.  Work with your health care provider or diet and nutrition specialist (dietitian) to adjust your eating plan to  your individual calorie needs. This information is not intended to replace advice given to you by your health care provider. Make sure you discuss any questions you have with your health care provider. Document Released: 09/07/2011 Document Revised: 09/11/2016 Document Reviewed: 09/11/2016 Elsevier Interactive Patient Education  2017 [employer] Inc.    Raylene Everts, MD

## 2017-01-15 NOTE — Patient Instructions (Addendum)
Take the xanax as needed for anxiety This is habit forming and will not be refilled It is intended to help you quit smoking congratulations on quitting - keep up the good work Need follow up and blood work in 3 months  Take the vitamin D 50000 u once a week for 12 weeks After this take 2000 u a day     DASH Eating Plan DASH stands for "Dietary Approaches to Stop Hypertension." The DASH eating plan is a healthy eating plan that has been shown to reduce high blood pressure (hypertension). It may also reduce your risk for type 2 diabetes, heart disease, and stroke. The DASH eating plan may also help with weight loss. What are tips for following this plan? General guidelines   Avoid eating more than 2,300 mg (milligrams) of salt (sodium) a day. If you have hypertension, you may need to reduce your sodium intake to 1,500 mg a day.  Limit alcohol intake to no more than 1 drink a day for nonpregnant women and 2 drinks a day for men. One drink equals 12 oz of beer, 5 oz of wine, or 1 oz of hard liquor.  Work with your health care provider to maintain a healthy body weight or to lose weight. Ask what an ideal weight is for you.  Get at least 30 minutes of exercise that causes your heart to beat faster (aerobic exercise) most days of the week. Activities may include walking, swimming, or biking.  Work with your health care provider or diet and nutrition specialist (dietitian) to adjust your eating plan to your individual calorie needs. Reading food labels   Check food labels for the amount of sodium per serving. Choose foods with less than 5 percent of the Daily Value of sodium. Generally, foods with less than 300 mg of sodium per serving fit into this eating plan.  To find whole grains, look for the word "whole" as the first word in the ingredient list. Shopping   Buy products labeled as "low-sodium" or "no salt added."  Buy fresh foods. Avoid canned foods and premade or frozen  meals. Cooking   Avoid adding salt when cooking. Use salt-free seasonings or herbs instead of table salt or sea salt. Check with your health care provider or pharmacist before using salt substitutes.  Do not fry foods. Cook foods using healthy methods such as baking, boiling, grilling, and broiling instead.  Cook with heart-healthy oils, such as olive, canola, soybean, or sunflower oil. Meal planning    Eat a balanced diet that includes:  5 or more servings of fruits and vegetables each day. At each meal, try to fill half of your plate with fruits and vegetables.  Up to 6-8 servings of whole grains each day.  Less than 6 oz of lean meat, poultry, or fish each day. A 3-oz serving of meat is about the same size as a deck of cards. One egg equals 1 oz.  2 servings of low-fat dairy each day.  A serving of nuts, seeds, or beans 5 times each week.  Heart-healthy fats. Healthy fats called Omega-3 fatty acids are found in foods such as flaxseeds and coldwater fish, like sardines, salmon, and mackerel.  Limit how much you eat of the following:  Canned or prepackaged foods.  Food that is high in trans fat, such as fried foods.  Food that is high in saturated fat, such as fatty meat.  Sweets, desserts, sugary drinks, and other foods with added sugar.  Full-fat  dairy products.  Do not salt foods before eating.  Try to eat at least 2 vegetarian meals each week.  Eat more home-cooked food and less restaurant, buffet, and fast food.  When eating at a restaurant, ask that your food be prepared with less salt or no salt, if possible. What foods are recommended? The items listed may not be a complete list. Talk with your dietitian about what dietary choices are best for you. Grains  Whole-grain or whole-wheat bread. Whole-grain or whole-wheat pasta. Brown rice. [place]. Bulgur. Whole-grain and low-sodium cereals. Pita bread. Low-fat, low-sodium crackers. Whole-wheat flour  tortillas. Vegetables  Fresh or frozen vegetables (raw, steamed, roasted, or grilled). Low-sodium or reduced-sodium tomato and vegetable juice. Low-sodium or reduced-sodium tomato sauce and tomato paste. Low-sodium or reduced-sodium canned vegetables. Fruits  All fresh, dried, or frozen fruit. Canned fruit in natural juice (without added sugar). Meat and other protein foods  Skinless chicken or [place]. Ground chicken or [place]. Pork with fat trimmed off. Fish and seafood. Egg whites. Dried beans, peas, or lentils. Unsalted nuts, nut butters, and seeds. Unsalted canned beans. Lean cuts of beef with fat trimmed off. Low-sodium, lean deli meat. Dairy  Low-fat (1%) or fat-free (skim) milk. Fat-free, low-fat, or reduced-fat cheeses. Nonfat, low-sodium ricotta or cottage cheese. Low-fat or nonfat yogurt. Low-fat, low-sodium cheese. Fats and oils  Soft margarine without trans fats. Vegetable oil. Low-fat, reduced-fat, or light mayonnaise and salad dressings (reduced-sodium). Canola, safflower, olive, soybean, and sunflower oils. Avocado. Seasoning and other foods  Herbs. Spices. Seasoning mixes without salt. Unsalted popcorn and pretzels. Fat-free sweets. What foods are not recommended? The items listed may not be a complete list. Talk with your dietitian about what dietary choices are best for you. Grains  Baked goods made with fat, such as croissants, muffins, or some breads. Dry pasta or rice meal packs. Vegetables  Creamed or fried vegetables. Vegetables in a cheese sauce. Regular canned vegetables (not low-sodium or reduced-sodium). Regular canned tomato sauce and paste (not low-sodium or reduced-sodium). Regular tomato and vegetable juice (not low-sodium or reduced-sodium). Angie Fava. Olives. Fruits  Canned fruit in a light or heavy syrup. Fried fruit. Fruit in cream or butter sauce. Meat and other protein foods  Fatty cuts of meat. Ribs. Fried meat. Berniece Salines. Sausage. Bologna and other processed  lunch meats. Salami. Fatback. Hotdogs. Bratwurst. Salted nuts and seeds. Canned beans with added salt. Canned or smoked fish. Whole eggs or egg yolks. Chicken or [place] with skin. Dairy  Whole or 2% milk, cream, and half-and-half. Whole or full-fat cream cheese. Whole-fat or sweetened yogurt. Full-fat cheese. Nondairy creamers. Whipped toppings. Processed cheese and cheese spreads. Fats and oils  Butter. Stick margarine. Lard. Shortening. Ghee. Bacon fat. Tropical oils, such as coconut, palm kernel, or palm oil. Seasoning and other foods  Salted popcorn and pretzels. Onion salt, garlic salt, seasoned salt, table salt, and sea salt. Worcestershire sauce. Tartar sauce. Barbecue sauce. Teriyaki sauce. Soy sauce, including reduced-sodium. Steak sauce. Canned and packaged gravies. Fish sauce. Oyster sauce. Cocktail sauce. Horseradish that you find on the shelf. Ketchup. Mustard. Meat flavorings and tenderizers. Bouillon cubes. Hot sauce and Tabasco sauce. Premade or packaged marinades. Premade or packaged taco seasonings. Relishes. Regular salad dressings. Where to find more information:  National Heart, Lung, and [place]: https://wilson-eaton.com/  American Heart Association: www.heart.org Summary  The DASH eating plan is a healthy eating plan that has been shown to reduce high blood pressure (hypertension). It may also reduce your risk for type  2 diabetes, heart disease, and stroke.  With the DASH eating plan, you should limit salt (sodium) intake to 2,300 mg a day. If you have hypertension, you may need to reduce your sodium intake to 1,500 mg a day.  When on the DASH eating plan, aim to eat more fresh fruits and vegetables, whole grains, lean proteins, low-fat dairy, and heart-healthy fats.  Work with your health care provider or diet and nutrition specialist (dietitian) to adjust your eating plan to your individual calorie needs. This information is not intended to replace advice given to you by  your health care provider. Make sure you discuss any questions you have with your health care provider. Document Released: 09/07/2011 Document Revised: 09/11/2016 Document Reviewed: 09/11/2016 Elsevier Interactive Patient Education  2017 [employer].

## 2017-03-19 ENCOUNTER — Telehealth: Payer: Self-pay | Admitting: Orthopaedic Surgery

## 2017-03-19 ENCOUNTER — Encounter: Payer: Self-pay | Admitting: Interventional Radiology

## 2017-03-19 NOTE — Telephone Encounter (Signed)
FYI:  Patient left message on voice mail 03-16-17 @ 3:50 to canc her appt for this Tues 06-19 @ 1:30.  I called patient back to [facility] but got voice mail.  Left message for her to return call to [facility]   cb  [phone]

## 2017-03-20 ENCOUNTER — Ambulatory Visit: Payer: BLUE CROSS/BLUE SHIELD | Admitting: Orthopaedic Surgery

## 2017-03-23 ENCOUNTER — Ambulatory Visit: Payer: BLUE CROSS/BLUE SHIELD | Admitting: Family Medicine

## 2017-03-26 ENCOUNTER — Other Ambulatory Visit: Payer: Self-pay | Admitting: Interventional Radiology

## 2017-03-26 DIAGNOSIS — M79672 Pain in left foot: Secondary | ICD-10-CM

## 2017-03-26 DIAGNOSIS — M79671 Pain in right foot: Secondary | ICD-10-CM

## 2017-03-26 DIAGNOSIS — M79604 Pain in right leg: Secondary | ICD-10-CM

## 2017-03-26 DIAGNOSIS — M79605 Pain in left leg: Principal | ICD-10-CM

## 2017-03-27 ENCOUNTER — Ambulatory Visit (HOSPITAL_COMMUNITY)
Admission: RE | Admit: 2017-03-27 | Discharge: 2017-03-27 | Disposition: A | Discharge status: Home / Self Care | Payer: BLUE CROSS/BLUE SHIELD | Source: Ambulatory Visit | Attending: Interventional Radiology | Admitting: Interventional Radiology

## 2017-03-27 DIAGNOSIS — M79604 Pain in right leg: Secondary | ICD-10-CM | POA: Insufficient documentation

## 2017-03-27 DIAGNOSIS — I708 Atherosclerosis of other arteries: Secondary | ICD-10-CM | POA: Insufficient documentation

## 2017-03-27 DIAGNOSIS — M79671 Pain in right foot: Secondary | ICD-10-CM

## 2017-03-27 DIAGNOSIS — M79672 Pain in left foot: Secondary | ICD-10-CM | POA: Diagnosis present

## 2017-03-27 DIAGNOSIS — M79605 Pain in left leg: Secondary | ICD-10-CM | POA: Insufficient documentation

## 2017-03-28 ENCOUNTER — Ambulatory Visit
Admission: RE | Admit: 2017-03-28 | Discharge: 2017-03-28 | Disposition: A | Discharge status: Home / Self Care | Payer: BLUE CROSS/BLUE SHIELD | Source: Ambulatory Visit | Attending: Interventional Radiology | Admitting: Interventional Radiology

## 2017-03-28 DIAGNOSIS — M79604 Pain in right leg: Secondary | ICD-10-CM

## 2017-03-28 DIAGNOSIS — M79672 Pain in left foot: Secondary | ICD-10-CM

## 2017-03-28 DIAGNOSIS — M79605 Pain in left leg: Principal | ICD-10-CM

## 2017-03-28 DIAGNOSIS — M79671 Pain in right foot: Secondary | ICD-10-CM

## 2017-03-28 NOTE — Progress Notes (Signed)
Chief Complaint: Pain in the toes, pain with walking  Referring Physician(s): Dr. Marcheta Grammes  History of Present Illness: Tammy Mcpherson is a 61 y.o. female returning today as a scheduled follow up with [facility] for bilateral foot/toe pain and short distance claudication.   She is here today with her husband.    She is a woman who has a history of bilateral foot and toe pain going on for several months, with associated toe color changes.  She has not had any discrete wounds occur at the toes.  She endorses left calf pain with short distance ambulation.  I cannot elicit a history of typical resting pain of the legs.    Today our discussion was again regarding vascular disease and the symptoms/sequela, including the treatment strategies of maximizing medical therapy and eliminating risk factors.  This includes a discussion about quitting smoking.  She tells me she continues to smoke about a pack every 2-3 days.  She has smoked since the age of 53.  She tells me she has had a hard time cutting back, but is proud to say that she is less than 1 pack per day at this point.    Physical exam shows no wounds.  Doppler signal is positive at the bilateral AT and PT, with strong signal.  She complains of pain with manipulation of all of her toes to check intertriginous regions.    We discussed possible diagnostic tests to investigate for occult occlusive disease or lesions that may be contributing to embolic syndrome.    She is not interested in MRI, as she says she cannot tolerate the gantry.   CT is an option, and she feels that she can tolerate.   Conventional angio is an option, though she prefers not to undergo the invasive test.    Past Medical History:  Diagnosis Date  . Allergy    pollen  . Anemia 04/30/2016  . Cardiomyopathy [facility])    Diagnosed July 2017  . Combined congestive systolic and diastolic heart failure ([place])   . COPD  (chronic obstructive pulmonary disease) ([place])    a. suspected COPD.  Marland Kitchen Coronary artery calcification seen on CT scan   . Essential hypertension   . GERD (gastroesophageal reflux disease)   . History of bronchitis   . Hyperlipidemia   . Iron deficiency anemia 05/12/2016   bleeding ulcers  . PSVT (paroxysmal supraventricular tachycardia) ([place])    a. first noted on tele during adm 7-05/2016.  Marland Kitchen PVC's (premature ventricular contractions)    a. first noted on tele during adm 7-05/2016.  . Substance abuse   . Thrombocytosis ([place])   . Ulcer     Past Surgical History:  Procedure Laterality Date  . ABDOMINAL HYSTERECTOMY     polyps  about age 68  . Barbie Banner OSTEOTOMY Right 07/10/2013   Procedure: Barbie Banner OSTEOTOMY RIGHT FOOT;  Surgeon: Marcheta Grammes, DPM;  Location: [facility] ORS;  Service: Orthopedics;  Laterality: Right;  . BUNIONECTOMY Right 07/10/2013   Procedure: VOGLER BUNIONECTOMY RIGHT FOOT;  Surgeon: Marcheta Grammes, DPM;  Location: [facility] ORS;  Service: Orthopedics;  Laterality: Right;  . COLONOSCOPY N/A 07/07/2016   Procedure: COLONOSCOPY;  Surgeon: Danie Binder, MD;  Location: [facility] SUITE;  Service: Endoscopy;  Laterality: N/A;  2:00 pm - moved to 10:30 - office notified pt  . ESOPHAGOGASTRODUODENOSCOPY N/A 07/07/2016   Procedure: ESOPHAGOGASTRODUODENOSCOPY (EGD);  Surgeon: Danie Binder, MD;  Location: [facility] END[facility]  SU[facility];  Service: Endoscopy;  Laterality: N/A;  . IR RADIOLOGIST EVAL & MGMT  12/05/2016  . METATARSAL HEAD EXCISION Right 07/10/2013   Procedure: METATARSAL HEAD RESECTION OF DIGITS 2 AND 3 RIGHT FOOT;  Surgeon: Marcheta Grammes, DPM;  Location: [facility] OR[facility];  Service: Orthopedics;  Laterality: Right;  . PROXIMAL INTERPHALANGEAL FUSION (PIP) Right 07/10/2013   Procedure: ARTHRODESIS PIPJ  2ND DIGIT RIGHT FOOT;  Surgeon: Marcheta Grammes, DPM;  Location: [facility]S;  Service: Orthopedics;  Laterality: Right;    Allergies: Codeine; Penicillins; and Sudafed [pseudoephedrine  hcl]  Medications: Prior to Admission medications   Medication Sig Start Date End Date Taking? Authorizing Provider  Acetaminophen 500 MG coapsule Take 1 capsule by mouth every 4 (four) hours as needed for fever.   Yes [provider]  ALPRAZolam Duanne Moron) 0.5 MG tablet Take 0.5 tablets (0.25 mg total) by mouth 2 (two) times daily as needed for anxiety. 01/15/17  Yes Raylene Everts, MD  aspirin 81 MG chewable tablet Chew 81 mg by mouth daily.   Yes [provider]  atorvastatin (LIPITOR) 20 MG tablet Take 1 tablet (20 mg total) by mouth daily. 01/01/17  Yes Raylene Everts, MD  buPROPion [facility] SR) 100 MG 12 hr tablet Take 1 tablet (100 mg total) by mouth 2 (two) times daily. 01/01/17  Yes Raylene Everts, MD  furosemide (LASIX) 20 MG tablet Take 1 tablet (20 mg total) by mouth daily. 06/06/16  Yes Lendon Colonel, NP  losartan (COZAAR) 100 MG tablet Take 1 tablet (100 mg total) by mouth daily. 01/01/17  Yes Raylene Everts, MD  omeprazole (PRILOSEC) 20 MG capsule 1 po every morning 30 minutes prior to your first meal. 06/22/16  Yes Fields, Marga Melnick, MD  PROAIR HFA 108 (90 Base) MCG/ACT inhaler INHALE 2 PUFFS INTO THE LUNGS EVERY 6 HOURS AS NEEDED FOR WHEEZING ORSHORTNESS OF BREATH. 11/03/16  Yes Lendon Colonel, NP  spironolactone (ALDACTONE) 25 MG tablet Take 0.5 tablets (12.5 mg total) by mouth daily. 06/06/16  Yes Lendon Colonel, NP  Vitamin D, Ergocalciferol, (DRISDOL) 50000 units CAPS capsule Take 1 capsule (50,000 Units total) by mouth every 7 (seven) days. 01/15/17  Yes Raylene Everts, MD  aspirin 325 MG tablet Take 325 mg by mouth daily.    [provider]  bisoprolol (ZEBETA) 10 MG tablet Take 1 tablet (10 mg total) by mouth daily. Patient not taking: Reported on 03/28/2017 06/06/16   Lendon Colonel, NP  ferrous gluconate (FERGON) 324 MG tablet Take 1 tablet (324 mg total) by mouth 2 (two) times daily with a meal. Patient not taking:  Reported on 03/28/2017 05/03/16   Samuella Cota, MD     Family History  Problem Relation Age of Onset  . Adopted: Yes  . Hypertension Father   . Coronary artery disease Sister     Social History   Social History  . Marital status: Married    Spouse name: [name]  . Number of children: 1  . Years of education: 21   Occupational History  . disabled     heart   Social History Main Topics  . Smoking status: Former Smoker    Packs/day: 0.25    Years: 37.00    Types: Cigarettes    Start date: 05/10/1975    Quit date: 01/12/2017  . Smokeless tobacco: Never Used  . Alcohol use No  . Drug use: No  . Sexual activity: Yes  Birth control/ protection: Surgical   Other Topics Concern  . Not on file   Social History Narrative   Disabled   Lives with husband [name]   Also helping mother in law and her friend   Two dogs       Review of Systems: A 12 point ROS discussed and pertinent positives are indicated in the HPI above.  All other systems are negative.  Review of Systems  Vital Signs: BP (!) 186/8   Pulse 90   Temp 98.3 F (36.8 C) (Oral)   Resp 15   Ht 5\' 5"  (1.651 m)   Wt 140 lb (63.5 kg)   SpO2 97%   BMI 23.30 kg/m   Physical Exam Targeted exam at the feet shows toe deformities bilateral, with doppler positive AT and PT bilateral.  No wounds.    Mallampati Score:     Imaging: US Arterial Seg Multiple  Result Date: 03/27/2017 CLINICAL DATA:  61 year old female with a history of foot pain/claudication. Cardiovascular risk factors include smoking, hypertension, hyperlipidemia EXAM: NONINVASIVE PHYSIOLOGIC VASCULAR STUDY OF BILATERAL LOWER EXTREMITIES TECHNIQUE: Evaluation of both lower extremities was performed at rest, including calculation of ankle-brachial indices, multiple segmental pressure evaluation, segmental Doppler and segmental pulse volume recording. COMPARISON:  11/15/2016 FINDINGS: Right: Resting ankle brachial index:  1.08 Segmental blood  pressure: Symmetric upper extremity pressures. Appropriate increased to the thigh. No significant drop from segment a segment. Digital pressure measures 42 systolic Doppler: Segmental Doppler of the right lower extremity demonstrates triphasic femoral artery superficial femoral artery, and popliteal artery. Amplitude is decreased from the comparison. Artifact of the tibial vessels, though there appears to be at least biphasic waveform. Pulse volume recording: Segmental PVR of the right lower extremity demonstrates waveform amplitude and quality maintained in the thigh. Augmentation maintained. Amplitude at the ankle and metatarsal segment maintain. Flat line of the right great toe. Toe PPG waveforms flat line in the first and second digit. Abnormal waveform of the third fourth and fifth digits. Left: Resting ankle brachial index: 0.7 Segmental blood pressure: Symmetric upper extremity pressures. Thigh value decreased from the upper extremity value. No significant drop from segment a segment. Digital pressure measures 55 systolic. Doppler: Segmental Doppler of the left lower extremity demonstrates some artifact of the femoral arteries. There appears to be biphasic waveform. Waveform of the left popliteal artery an the tibial vessels appears monophasic. Pulse volume recording: Segmental PVR of the left lower extremity demonstrates loss of quality and amplitude, with loss of augmentation and deterioration at the ankle and metatarsal segment. Toe PPG ease on the left demonstrate abnormal waveform of the first digit, third fourth and fifth digits. Flat line of the left second toe. Additional: IMPRESSION: Right: Resting ankle-brachial index on the right within normal limits, with the segmental exam suggesting developing tibial/small vessel disease. Left: Resting ankle-brachial index in the moderate range arterial occlusive disease. Segmental exam suggests developing iliac disease as well as femoral popliteal and  tibial/small vessel disease. Signed, Dulcy Fanny. Earleen Newport, DO [facility] Electronically Signed   By: Corrie Mckusick D.O.   On: 03/27/2017 11:04    Labs:  CBC:  Recent Labs  05/09/16 1027 05/31/16 0816 06/28/16 1155 01/01/17 1352  WBC 25.9* 9.3 10.8* 11.6*  HGB 11.0* 13.0 14.1 12.2  HCT 37.2 41.5 43.3 38.4  PLT 802* 384 387 500*    COAGS: No results for input(s): INR, APTT in the last 8760 hours.  BMP:  Recent Labs  05/03/16 0533  05/26/16 1202 05/31/16 0816 06/28/16 1155 01/01/17 1352  NA 140  < > 137 134* 137 142  K 3.9  < > 4.1 4.0 3.7 4.6  CL 102  < > 104 104 104 107  CO2 30  < > 23 24 26 25   GLUCOSE 103*  < > 140* 152* 140* 115*  BUN 37*  < > 14 14 9 11   CALCIUM 8.8*  < > 9.4 8.9 9.3 9.6  CREATININE 1.08*  < > 0.89 0.94 0.77 0.69  GFRNONAA 55*  --  >60 >60 >60  --   GFRAA >60  --  >60 >60 >60  --   < > = values in this interval not displayed.  LIVER FUNCTION TESTS:  Recent Labs  05/09/16 1027 05/31/16 0816 06/28/16 1155 01/01/17 1352  BILITOT 0.2 0.2* 0.3 0.2  AST 10 23 20 14   ALT 12 21 17 15   ALKPHOS 69 81 75 102  PROT 6.5 7.2 7.2 6.7  ALBUMIN 3.7 3.8 3.8 3.8    TUMOR MARKERS:  Recent Labs  06/28/16 1155  CA199 9    Assessment and Plan:  Ms Saulnier is a 61 year old female with multiple CV risk factors, including ongoing smoking, with symptoms of bilateral foot and toe pain and left leg short distance claudication.    Given her relatively normal segmental non-invasive exam, it is not exactly clear whether this is related to occlusive disease, or if there is another process such as cardio-embolic disease or Buerger's disease.     She still remains uncertain regarding formal angiogram, but since she continues to have pain, she is willing to undergo CTA runoff.    It would be useful if she would quit smoking for multiple reasons, which we talked about. Potentially, superimposed Buerger's  disease could be ruled out if her pain improved after cessation.    We will plan on CTA to investigate for occlusive disease.    Plan: - Schedule CTA abd/pelvis with run-off of bilateral legs.  - Continue maximal medical therapy - Continue attempts for smoking cessation - 6 month follow up office visit for surveillance if there is no significant finding on the CTA   Electronically Signed: Corrie Mckusick 03/28/2017, 3:15 PM   I spent a total of    25 Minutes in face to face in clinical consultation, greater than 50% of which was counseling/coordinating care for PAD, possible occlusive disease contributing to painful toes/claudication.

## 2017-03-30 ENCOUNTER — Encounter: Payer: Self-pay | Admitting: Family Medicine

## 2017-03-30 ENCOUNTER — Ambulatory Visit (HOSPITAL_COMMUNITY): Payer: BLUE CROSS/BLUE SHIELD

## 2017-03-30 ENCOUNTER — Ambulatory Visit (INDEPENDENT_AMBULATORY_CARE_PROVIDER_SITE_OTHER): Payer: BLUE CROSS/BLUE SHIELD | Admitting: Family Medicine

## 2017-03-30 VITALS — BP 186/70 | HR 96 | Temp 98.4°F | Resp 20 | Ht 65.0 in | Wt 156.0 lb

## 2017-03-30 DIAGNOSIS — G4762 Sleep related leg cramps: Secondary | ICD-10-CM

## 2017-03-30 DIAGNOSIS — I70219 Atherosclerosis of native arteries of extremities with intermittent claudication, unspecified extremity: Secondary | ICD-10-CM

## 2017-03-30 DIAGNOSIS — I700 Atherosclerosis of aorta: Secondary | ICD-10-CM | POA: Diagnosis not present

## 2017-03-30 DIAGNOSIS — Z720 Tobacco use: Secondary | ICD-10-CM | POA: Diagnosis not present

## 2017-03-30 LAB — CBC
HCT: 31 % — ABNORMAL LOW (ref 35.0–45.0)
Hemoglobin: 9.6 g/dL — ABNORMAL LOW (ref 11.7–15.5)
MCH: 24.2 pg — ABNORMAL LOW (ref 27.0–33.0)
MCHC: 31 g/dL — ABNORMAL LOW (ref 32.0–36.0)
MCV: 78.1 fL — ABNORMAL LOW (ref 80.0–100.0)
MPV: 9.5 fL (ref 7.5–12.5)
PLATELETS: 649 10*3/uL — AB (ref 140–400)
RBC: 3.97 MIL/uL (ref 3.80–5.10)
RDW: 15.6 % — AB (ref 11.0–15.0)
WBC: 13.9 10*3/uL — AB (ref 3.8–10.8)

## 2017-03-30 MED ORDER — TIZANIDINE HCL 4 MG PO TABS: 4.0000 mg | ORAL_TABLET | Freq: Every day | ORAL | 0 refills | Status: DC

## 2017-03-30 MED ORDER — NICOTINE 10 MG IN INHA: 1.0000 | RESPIRATORY_TRACT | 0 refills | Status: DC | PRN

## 2017-03-30 MED ORDER — OMEPRAZOLE 20 MG PO CPDR: DELAYED_RELEASE_CAPSULE | ORAL | 3 refills | Status: DC

## 2017-03-30 NOTE — Patient Instructions (Signed)
Need blood today I will send you a letter with your test results.  If there is anything of concern, we will call right away.  Try to quit smoking Use the nicotrol INSTEAD of a cigarette Gradually reduce use of nicotrol  Stop the wellbutrin  Take a muscle relaxer an hour before bed to help with cramps Drink plenty of water  See me on usual schedule

## 2017-03-30 NOTE — Progress Notes (Signed)
Chief Complaint  Patient presents with  . Leg Pain    x 1 month  Patient is here for leg pain. It is bilateral. She states it's in her calves and and sometimes in the back of her thighs. She states every time she tries to walk her calves cramp terribly. In addition she's having cramping in her legs every night when she tries to sleep. She states she hasn't slept well for days. She is under the care of vascular surgery. She recently had an imaging study of her legs. This does document that she has arterial occlusive disease. I discussed these results with her. I explained that her symptoms are typical for claudication. Her symptoms, interestingly her bilateral while her occlusive disease appears more unilateral. Perhaps she has occlusion in her iliacs that is not yet detected. We discussed management of claudication includes lowering cholesterol, reducing blood pressure, smoking cessation, medications, exercise, and possible surgery. The nocturnal cramping may also be from her arterial insufficiency. I will check electrolytes today. She has a known vitamin D deficiency as well. We spent sometime discussing her tobacco use. We discussed the various methods of quitting. I explained to her that the most important thing she could do for her health was to quit smoking. I explained that if she doesn't quit smoking she will continue to have progressive problems with her heart and vasculature. She is also placing herself at risk for other disease such as cancer. Sure he has COPD and this will worsen as well. She is very reluctant to even make an effort to quit smoking. She isn't tolerating Wellbutrin, makes her "skin crawl". I worry about giving her Chantix. She is willing to try nicotine replacement. Her husband is here with her today and is supportive. He does not smoke.   Patient Active Problem List   Diagnosis Date Noted  . Vitamin D deficiency 01/15/2017  . PAD (peripheral artery disease) ([place])  01/15/2017  . Aortic atherosclerosis ([place]) 01/01/2017  . Gastric ulcer 01/01/2017  . HLD (hyperlipidemia) 01/01/2017  . Dyspnea on effort 01/01/2017  . 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 Anemia, iron deficiency 05/12/2016  . Cardiomyopathy- etiology not yet determined 05/02/2016  . CAD-Ca++ coronaries on CTA 05/02/2016  . Acute combined systolic and diastolic heart failure ([place]) 04/30/2016  . COPD exacerbation ([place]) 04/30/2016  . Essential hypertension 04/30/2016  . Tobacco use disorder 04/30/2016    Outpatient Encounter Prescriptions as of 03/30/2017  Medication Sig  . Acetaminophen 500 MG coapsule Take 1 capsule by mouth every 4 (four) hours as needed for fever.  . ALPRAZolam (XANAX) 0.5 MG tablet Take 0.5 tablets (0.25 mg total) by mouth 2 (two) times daily as needed for anxiety.  Marland Kitchen aspirin 81 MG chewable tablet Chew 81 mg by mouth daily.  Marland Kitchen atorvastatin (LIPITOR) 20 MG tablet Take 1 tablet (20 mg total) by mouth daily.  . bisoprolol (ZEBETA) 10 MG tablet Take 1 tablet (10 mg total) by mouth daily.  . cholecalciferol (VITAMIN D) 1000 units tablet Take 2,000 Units by mouth daily.  . furosemide (LASIX) 20 MG tablet Take 1 tablet (20 mg total) by mouth daily.  Marland Kitchen losartan (COZAAR) 100 MG tablet Take 1 tablet (100 mg total) by mouth daily.  Marland Kitchen omeprazole (PRILOSEC) 20 MG capsule 1 po every morning 30 minutes prior to your first meal.  . PROAIR HFA 108 (90 Base) MCG/ACT inhaler INHALE 2 PUFFS INTO THE LUNGS EVERY 6 HOURS AS NEEDED FOR WHEEZING ORSHORTNESS OF BREATH.  Marland Kitchen  spironolactone (ALDACTONE) 25 MG tablet Take 0.5 tablets (12.5 mg total) by mouth daily.  . [DISCONTINUED] buPROPion (WELLBUTRIN SR) 100 MG 12 hr tablet Take 1 tablet (100 mg total) by mouth 2 (two) times daily. (Patient taking differently: Take 100 mg by mouth as needed. )  . [DISCONTINUED] omeprazole (PRILOSEC) 20 MG capsule 1 po every morning 30 minutes prior to your first meal.  . nicotine (NICOTROL) 10 MG inhaler Inhale 1  Cartridge (1 continuous puffing total) into the lungs as needed for smoking cessation.  Marland Kitchen tiZANidine (ZANAFLEX) 4 MG tablet Take 1 tablet (4 mg total) by mouth at bedtime. May take 2 if needed  . [DISCONTINUED] aspirin 325 MG tablet Take 325 mg by mouth daily.  . [DISCONTINUED] ferrous gluconate (FERGON) 324 MG tablet Take 1 tablet (324 mg total) by mouth 2 (two) times daily with a meal. (Patient not taking: Reported on 03/28/2017)  . [DISCONTINUED] Vitamin D, Ergocalciferol, (DRISDOL) 50000 units CAPS capsule Take 1 capsule (50,000 Units total) by mouth every 7 (seven) days. (Patient not taking: Reported on 03/30/2017)   No facility-administered encounter medications on file as of 03/30/2017.     Allergies  Allergen Reactions  . Codeine Anaphylaxis    Tongue swelled  . Penicillins Swelling and Rash    Has patient had a PCN reaction causing immediate rash, facial/tongue/throat swelling, SOB or lightheadedness with hypotension: yes Has patient had a PCN reaction causing severe rash involving mucus membranes or skin necrosis: No Has patient had a PCN reaction that required hospitalization No Has patient had a PCN reaction occurring within the last 10 years: No If all of the above answers are "NO", then may proceed with Cephalosporin use.   [place] [Pseudoephedrine Hcl] Rash    Review of Systems  Constitutional: Negative for activity change, appetite change and unexpected weight change.  HENT: Negative for congestion, dental problem, postnasal drip and rhinorrhea.   Eyes: Negative for redness and visual disturbance.  Respiratory: Positive for shortness of breath. Negative for cough.   Cardiovascular: Negative for chest pain, palpitations and leg swelling.  Gastrointestinal: Negative for abdominal pain, constipation and diarrhea.  Genitourinary: Negative for difficulty urinating and frequency.  Musculoskeletal: Positive for gait problem. Negative for arthralgias and back pain.       Leg  cramps  Neurological: Negative for dizziness and headaches.  Psychiatric/Behavioral: Positive for sleep disturbance. Negative for dysphoric mood. The patient is nervous/anxious.    BP (!) 186/70 (BP Location: Right Arm, Patient Position: Sitting, Cuff Size: Normal)   Pulse 96   Temp 98.4 F (36.9 C) (Temporal)   Resp 20   Ht 5\' 5"  (1.651 m)   Wt 156 lb (70.8 kg)   SpO2 96%   BMI 25.96 kg/m   Physical Exam  Constitutional: She appears well-developed and well-nourished.  Appears older than stated age. Smells of tobacco. Guarded movements  HENT:  Head: Normocephalic and atraumatic.  Right Ear: External ear normal.  Left Ear: External ear normal.  Mouth/Throat: Oropharynx is clear and moist.  Eyes: Conjunctivae are normal. Pupils are equal, round, and reactive to light.  Neck: Normal range of motion.  No bruit  Cardiovascular: Normal rate and regular rhythm.   Pedal pulses diminished  Pulmonary/Chest: Effort normal and breath sounds normal.  Decreased breath sounds  Musculoskeletal: She exhibits deformity.  Orthopedic deformities toes  Lymphadenopathy:    She has no cervical adenopathy.  Neurological: She is alert.  Psychiatric: She has a normal mood and affect.  ASSESSMENT/PLAN:  1. Aortic atherosclerosis ([place])  2. Extremity atherosclerosis with intermittent claudication (HCC) - CBC - COMPLETE METABOLIC PANEL WITH GFR - Lipid panel - VITAMIN D 25 Hydroxy (Vit-D Deficiency, Fractures) - Magnesium  3. Tobacco abuse Patient reluctantly agrees to try to quit. Complains about her anxiety and "nerves". We'll try Nicotrol.  4. Nocturnal leg cramps Stretching at bedtime as recommended. Adequate hydration is recommended.   Patient Instructions  Need blood today I will send you a letter with your test results.  If there is anything of concern, we will call right away.  Try to quit smoking Use the nicotrol INSTEAD of a cigarette Gradually reduce use of  nicotrol  Stop the wellbutrin  Take a muscle relaxer an hour before bed to help with cramps Drink plenty of water  See me on usual schedule   Raylene Everts, MD

## 2017-03-31 LAB — LIPID PANEL
Cholesterol: 206 mg/dL — ABNORMAL HIGH (ref <200)
HDL: 28 mg/dL — AB (ref >50)
Total CHOL/HDL Ratio: 7.4 Ratio — ABNORMAL HIGH (ref <5.0)
Triglycerides: 421 mg/dL — ABNORMAL HIGH (ref <150)

## 2017-03-31 LAB — COMPLETE METABOLIC PANEL WITH GFR
ALT: 14 U/L (ref 6–29)
AST: 13 U/L (ref 10–35)
Albumin: 3.8 g/dL (ref 3.6–5.1)
Alkaline Phosphatase: 103 U/L (ref 33–130)
BILIRUBIN TOTAL: 0.2 mg/dL (ref 0.2–1.2)
BUN: 9 mg/dL (ref 7–25)
CHLORIDE: 108 mmol/L (ref 98–110)
CO2: 21 mmol/L (ref 20–31)
CREATININE: 0.84 mg/dL (ref 0.50–1.05)
Calcium: 9.2 mg/dL (ref 8.6–10.4)
GFR, Est African American: 88 mL/min (ref >=60)
GFR, Est Non African American: 76 mL/min (ref >=60)
GLUCOSE: 77 mg/dL (ref 65–99)
Potassium: 4.4 mmol/L (ref 3.5–5.3)
SODIUM: 140 mmol/L (ref 135–146)
TOTAL PROTEIN: 6.7 g/dL (ref 6.1–8.1)

## 2017-03-31 LAB — VITAMIN D 25 HYDROXY (VIT D DEFICIENCY, FRACTURES): Vit D, 25-Hydroxy: 20 ng/mL — ABNORMAL LOW (ref 30–100)

## 2017-03-31 LAB — MAGNESIUM: Magnesium: 2.3 mg/dL (ref 1.5–2.5)

## 2017-04-02 ENCOUNTER — Other Ambulatory Visit: Payer: Self-pay | Admitting: Interventional Radiology

## 2017-04-02 ENCOUNTER — Telehealth: Payer: Self-pay | Admitting: *Deleted

## 2017-04-02 DIAGNOSIS — M79671 Pain in right foot: Secondary | ICD-10-CM

## 2017-04-02 DIAGNOSIS — M79604 Pain in right leg: Secondary | ICD-10-CM

## 2017-04-02 DIAGNOSIS — M79605 Pain in left leg: Principal | ICD-10-CM

## 2017-04-02 DIAGNOSIS — M79672 Pain in left foot: Secondary | ICD-10-CM

## 2017-04-02 NOTE — Telephone Encounter (Signed)
Patient's husband called back stating to disregard last message. Patient is scheduled for tomorrow per husband

## 2017-04-02 NOTE — Telephone Encounter (Signed)
Patient's husband called about getting patient scheduled for a CT, per patient's husband patient did her ultrasound and now she needs a CT. Please advise [phone]

## 2017-04-03 ENCOUNTER — Ambulatory Visit (HOSPITAL_COMMUNITY)
Admission: RE | Admit: 2017-04-03 | Discharge: 2017-04-03 | Disposition: A | Discharge status: Home / Self Care | Payer: BLUE CROSS/BLUE SHIELD | Source: Ambulatory Visit | Attending: Interventional Radiology | Admitting: Interventional Radiology

## 2017-04-03 ENCOUNTER — Inpatient Hospital Stay: Admission: RE | Admit: 2017-04-03 | Payer: BLUE CROSS/BLUE SHIELD | Source: Ambulatory Visit

## 2017-04-03 DIAGNOSIS — I739 Peripheral vascular disease, unspecified: Secondary | ICD-10-CM | POA: Diagnosis not present

## 2017-04-03 DIAGNOSIS — M79604 Pain in right leg: Secondary | ICD-10-CM | POA: Diagnosis present

## 2017-04-03 DIAGNOSIS — M79605 Pain in left leg: Secondary | ICD-10-CM | POA: Insufficient documentation

## 2017-04-03 DIAGNOSIS — I700 Atherosclerosis of aorta: Secondary | ICD-10-CM | POA: Insufficient documentation

## 2017-04-03 DIAGNOSIS — M79671 Pain in right foot: Secondary | ICD-10-CM

## 2017-04-03 DIAGNOSIS — M79672 Pain in left foot: Secondary | ICD-10-CM | POA: Diagnosis not present

## 2017-04-03 MED ORDER — IOPAMIDOL (ISOVUE-370) INJECTION 76%
150.0000 mL | Freq: Once | INTRAVENOUS | Status: AC | PRN
  Administered 2017-04-03: 150 mL via INTRAVENOUS

## 2017-04-05 ENCOUNTER — Encounter: Payer: Self-pay | Admitting: Family Medicine

## 2017-04-05 ENCOUNTER — Ambulatory Visit: Payer: BLUE CROSS/BLUE SHIELD | Admitting: Family Medicine

## 2017-04-09 ENCOUNTER — Telehealth: Payer: Self-pay | Admitting: Family Medicine

## 2017-04-09 NOTE — Telephone Encounter (Signed)
Patient called and left message on nurse line. States that she had a CT scan done by Dr Jacqualyn Posey at [facility]. She states he told her he didn't see anything 'exciting' on CT. She is still having bad cramps, and wants to know if Dr Meda Coffee will take a look at CT scan as sort of a second opinion to see if she sees anything. She also asks that nothing be put on MyChart. She states it confuses her and she is unable to get her labs or anything off of it.   Please return patient call at [phone]

## 2017-04-10 NOTE — Telephone Encounter (Signed)
Patient informed of message below, verbalized understanding.  

## 2017-04-10 NOTE — Telephone Encounter (Signed)
Please see below, pt left vm on nurse line again for someone to contact her back [phone]

## 2017-04-10 NOTE — Telephone Encounter (Signed)
Called patient regarding message below. No answer, left generic message for patient to return call.   

## 2017-04-10 NOTE — Telephone Encounter (Signed)
Follow up  Pt lvm on nurse line returning your call.  Please f/u

## 2017-04-10 NOTE — Telephone Encounter (Signed)
The CT scan showed hardening of the arteries and narrowing of the arteries in her abdomen and her legs.  I will try to get Dr Pasty Arch notes to further evaluate his opinion.  I have deactivated My Chart

## 2017-04-10 NOTE — Telephone Encounter (Signed)
Follow up  Pt voiced on vm her phone is near her and we can return her call.

## 2017-04-17 ENCOUNTER — Telehealth: Payer: Self-pay | Admitting: Family Medicine

## 2017-04-17 NOTE — Telephone Encounter (Signed)
New Message  Pt voiced wanting to know if we can call gastro to schedule a sooner appt than 8.27.18 per pt.  Pt voiced gastro wants to know the urgency, if it is urgent to call the office for sooner appt than 8.27.18.  Pt voiced she has questions she would like to ask nurse also.  Please f/u

## 2017-04-17 NOTE — Telephone Encounter (Signed)
This is a follow up appointment for her.  They sent her letters in the chart to come back for repeat EGD.  She failed to call.  I didn't refer her and do not know of any urgency, just told her to keep her follow up.  She has anemia being worked up.

## 2017-04-18 ENCOUNTER — Other Ambulatory Visit: Payer: Self-pay

## 2017-04-18 DIAGNOSIS — I70249 Atherosclerosis of native arteries of left leg with ulceration of unspecified site: Secondary | ICD-10-CM

## 2017-04-19 ENCOUNTER — Encounter (HOSPITAL_COMMUNITY): Payer: Self-pay | Admitting: Nurse Practitioner

## 2017-04-19 ENCOUNTER — Emergency Department (HOSPITAL_COMMUNITY)
Admission: EM | Admit: 2017-04-19 | Discharge: 2017-04-19 | Disposition: A | Discharge status: Home / Self Care | Payer: BLUE CROSS/BLUE SHIELD | Attending: Emergency Medicine | Admitting: Emergency Medicine

## 2017-04-19 DIAGNOSIS — I110 Hypertensive heart disease with heart failure: Secondary | ICD-10-CM | POA: Insufficient documentation

## 2017-04-19 DIAGNOSIS — I5040 Unspecified combined systolic (congestive) and diastolic (congestive) heart failure: Secondary | ICD-10-CM | POA: Diagnosis not present

## 2017-04-19 DIAGNOSIS — I96 Gangrene, not elsewhere classified: Secondary | ICD-10-CM | POA: Insufficient documentation

## 2017-04-19 DIAGNOSIS — I998 Other disorder of circulatory system: Secondary | ICD-10-CM | POA: Insufficient documentation

## 2017-04-19 DIAGNOSIS — Z8679 Personal history of other diseases of the circulatory system: Secondary | ICD-10-CM | POA: Diagnosis not present

## 2017-04-19 DIAGNOSIS — J449 Chronic obstructive pulmonary disease, unspecified: Secondary | ICD-10-CM | POA: Insufficient documentation

## 2017-04-19 DIAGNOSIS — Z79899 Other long term (current) drug therapy: Secondary | ICD-10-CM | POA: Insufficient documentation

## 2017-04-19 DIAGNOSIS — F17210 Nicotine dependence, cigarettes, uncomplicated: Secondary | ICD-10-CM | POA: Insufficient documentation

## 2017-04-19 DIAGNOSIS — M79672 Pain in left foot: Secondary | ICD-10-CM | POA: Diagnosis present

## 2017-04-19 DIAGNOSIS — Z7982 Long term (current) use of aspirin: Secondary | ICD-10-CM | POA: Insufficient documentation

## 2017-04-19 LAB — CBC WITH DIFFERENTIAL/PLATELET
Basophils Absolute: 0 10*3/uL (ref 0.0–0.1)
Basophils Relative: 0 %
EOS ABS: 0.1 10*3/uL (ref 0.0–0.7)
EOS PCT: 1 %
HCT: 29.5 % — ABNORMAL LOW (ref 36.0–46.0)
Hemoglobin: 8.8 g/dL — ABNORMAL LOW (ref 12.0–15.0)
LYMPHS ABS: 1.8 10*3/uL (ref 0.7–4.0)
Lymphocytes Relative: 12 %
MCH: 22.7 pg — AB (ref 26.0–34.0)
MCHC: 29.8 g/dL — AB (ref 30.0–36.0)
MCV: 76.2 fL — ABNORMAL LOW (ref 78.0–100.0)
MONO ABS: 1.7 10*3/uL — AB (ref 0.1–1.0)
MONOS PCT: 11 %
Neutro Abs: 11.9 10*3/uL — ABNORMAL HIGH (ref 1.7–7.7)
Neutrophils Relative %: 76 %
PLATELETS: 551 10*3/uL — AB (ref 150–400)
RBC: 3.87 MIL/uL (ref 3.87–5.11)
RDW: 15.9 % — ABNORMAL HIGH (ref 11.5–15.5)
WBC: 15.5 10*3/uL — ABNORMAL HIGH (ref 4.0–10.5)

## 2017-04-19 LAB — BASIC METABOLIC PANEL
Anion gap: 8 (ref 5–15)
BUN: 8 mg/dL (ref 6–20)
CHLORIDE: 110 mmol/L (ref 101–111)
CO2: 21 mmol/L — AB (ref 22–32)
CREATININE: 0.85 mg/dL (ref 0.44–1.00)
Calcium: 9 mg/dL (ref 8.9–10.3)
GFR calc non Af Amer: >60 mL/min (ref >60)
Glucose, Bld: 152 mg/dL — ABNORMAL HIGH (ref 65–99)
Potassium: 4.3 mmol/L (ref 3.5–5.1)
SODIUM: 139 mmol/L (ref 135–145)

## 2017-04-19 LAB — I-STAT CG4 LACTIC ACID, ED: LACTIC ACID, VENOUS: 2.05 mmol/L — AB (ref 0.5–1.9)

## 2017-04-19 MED ORDER — ACETAMINOPHEN 325 MG PO TABS
650.0000 mg | ORAL_TABLET | Freq: Once | ORAL | Status: AC
Start: 2017-04-19 — End: 2017-04-19
  Administered 2017-04-19: 650 mg via ORAL
  Filled 2017-04-19: qty 2

## 2017-04-19 NOTE — ED Notes (Signed)
Waiting on vascular patient updated

## 2017-04-19 NOTE — Discharge Instructions (Signed)
Arrive at [facility] on [address] by 7am on Tuesday morning for your procedure.  (angiogram of left leg with possible intervention) Nothing to eat or drink after midnight Monday night. You may take a sip of water with you medications on Tuesday morning Start taking your plavix as well as 81mg  Aspirin Tramadol as needed for pain

## 2017-04-19 NOTE — Progress Notes (Signed)
[name], female   DOB: 05/14/1956, 61 y.o.   MRN: [id number]    Referring Physician(s): Dr. Varney Biles Patient of Dr. Corrie Mckusick  Supervising Physician: Marybelle Killings  Patient Status: [facility] - ED  Chief Complaint: Pain of her left foot/toes  Subjective: This patient is well-known to Dr. Earleen Newport and is followed in the clinic.  She was seen by him in June and underwent a CTA of her left leg to evaluate her PAD.  She was found to have SFA disease, greatest on the left side with focal stenosis.  The patient at that time did not want to really pursue an invasive procedure such as an angiogram and was told to maximize medical therapy and to RTC in 6 months for evaluation.  However, over the last 2 weeks she states the pain in her toes on the left foot have worsened.  Within the last 2-3 days it has worsened even more.  She noticed that there were some color changes to the bottom of her toes and so presented to the [facility] today for evaluation.  She is followed by a podiatrist as well.    Allergies: Codeine; Penicillins; and Sudafed [pseudoephedrine hcl]  Medications: Prior to Admission medications   Medication Sig Start Date End Date Taking? Authorizing Provider  Acetaminophen 500 MG coapsule Take 1 capsule by mouth every 4 (four) hours as needed for fever.    [provider]  ALPRAZolam Duanne Moron) 0.5 MG tablet Take 0.5 tablets (0.25 mg total) by mouth 2 (two) times daily as needed for anxiety. 01/15/17   Raylene Everts, MD  aspirin 81 MG chewable tablet Chew 81 mg by mouth daily.    [provider]  atorvastatin (LIPITOR) 20 MG tablet Take 1 tablet (20 mg total) by mouth daily. 01/01/17   Raylene Everts, MD  bisoprolol (ZEBETA) 10 MG tablet Take 1 tablet (10 mg total) by mouth daily. 06/06/16   Lendon Colonel, NP  cholecalciferol (VITAMIN D) 1000 units tablet Take 2,000 Units by mouth daily.    [provider]  furosemide (LASIX) 20 MG tablet  Take 1 tablet (20 mg total) by mouth daily. 06/06/16   Lendon Colonel, NP  losartan (COZAAR) 100 MG tablet Take 1 tablet (100 mg total) by mouth daily. 01/01/17   Raylene Everts, MD  nicotine (NICOTROL) 10 MG inhaler Inhale 1 Cartridge (1 continuous puffing total) into the lungs as needed for smoking cessation. 03/30/17   Raylene Everts, MD  omeprazole (PRILOSEC) 20 MG capsule 1 po every morning 30 minutes prior to your first meal. 03/30/17   Raylene Everts, MD  PROAIR HFA [phone] Base) MCG/ACT inhaler INHALE 2 PUFFS INTO THE LUNGS EVERY 6 HOURS AS NEEDED FOR WHEEZING ORSHORTNESS OF BREATH. 11/03/16   Lendon Colonel, NP  spironolactone (ALDACTONE) 25 MG tablet Take 0.5 tablets (12.5 mg total) by mouth daily. 06/06/16   Lendon Colonel, NP  tiZANidine (ZANAFLEX) 4 MG tablet Take 1 tablet (4 mg total) by mouth at bedtime. May take 2 if needed 03/30/17   Raylene Everts, MD    Vital Signs: BP 129/78   Pulse (!) 108   Temp 97.7 F (36.5 C) (Oral)   Resp 18   SpO2 98%   Physical Exam: Gen: WD WN female in NAD Heart: regular, slightly tachycardic, no murmurs, gallops, or rubs Lungs: CTAB Abd: soft, NT, ND, +BS Ext: LLE with no edema.  Her left foot is  warm and has good palpable PT and DP pulses.  She does have a small area of gangrene on the plantar surface of her left 5th toe.  She does have some faint purple/ischemia to the plantar surface of her left great toe as well.  These are tender to touch.  She has a malformation of her left second toe.  Her RLE has no evidence of ischemia or pain.  Imaging: No results found.  Labs:  CBC:  Recent Labs  06/28/16 1155 01/01/17 1352 03/30/17 1520 04/19/17 1246  WBC 10.8* 11.6* 13.9* 15.5*  HGB 14.1 12.2 9.6* 8.8*  HCT 43.3 38.4 31.0* 29.5*  PLT 387 500* 649* 551*    COAGS: No results for input(s): INR, APTT in the last 8760 hours.  BMP:  Recent Labs  05/31/16 0816 06/28/16 1155 01/01/17 1352 03/30/17 1520  04/19/17 1246  NA 134* 137 142 140 139  K 4.0 3.7 4.6 4.4 4.3  CL 104 104 107 108 110  CO2 24 26 25 21  21*  GLUCOSE 152* 140* 115* 77 152*  BUN 14 9 11 9 8   CALCIUM 8.9 9.3 9.6 9.2 9.0  CREATININE 0.94 0.77 0.69 0.84 0.85  GFRNONAA >60 >60  --  76 >60  GFRAA >60 >60  --  88 >60    LIVER FUNCTION TESTS:  Recent Labs  05/31/16 0816 06/28/16 1155 01/01/17 1352 03/30/17 1520  BILITOT 0.2* 0.3 0.2 0.2  AST 23 20 14 13   ALT 21 17 15 14   ALKPHOS 81 75 102 103  PROT 7.2 7.2 6.7 6.7  ALBUMIN 3.8 3.8 3.8 3.8    Assessment and Plan: 1. PAD with SFA stenosis causing some ischemia of her left toes  Dr. Barbie Banner as well as myself have evaluated the patient.  We have discussed her case with multiple IR doctors including Dr. Earleen Newport whom she is followed by.  Because her foot is warm and she has no limb threatening ischemia at this time, we will place on dual antiplatelet therapy including plavix and ASA.  She has tramadol at home which she states controls her pain.  We will plan for next Tuesday morning to bring her in for an angiogram with plans for intervention at that time, including possible angioplasty, stenting, etc.  The patient and her husband are agreeable with this plan.  Electronically Signed: Henreitta Cea 04/19/2017, 3:39 PM   I spent a total of 35 Minutes at the the patient's bedside AND on the patient's hospital floor or unit, greater than 50% of which was counseling/coordinating care for PAD of LLE

## 2017-04-19 NOTE — ED Provider Notes (Signed)
[place] DEPT Provider Note   CSN: [id number] Arrival date & time: 04/19/17  1148     History   Chief Complaint Chief Complaint  Patient presents with  . Foot Pain    HPI Tammy Mcpherson is a 61 y.o. female.  HPI Pt comes in with cc of foot pain. Pt has hx of PVD, COPD. Cardiomyopathy, Anemia. She has deformities in the 1st two toes of her LLE and she is seeing Podiatry for it. Pt was seen by vascular doctors due to ongoing pain and had a CT on 04/03/17. Pt reports that since the CT consultion with vascular surgery pt has developed new redness in her toe and her pain has gotten worse. Pt was seen by her podiatrist who recommended that pt come to the hospital. Pt has no n/v/f/c. Pt applies topical cream with nifedipine and lidocaine in it which given her some relief. Pt is an active smoker.   Past Medical History:  Diagnosis Date  . Allergy    pollen  . Anemia 04/30/2016  . Cardiomyopathy [facility])    Diagnosed July 2017  . Combined congestive systolic and diastolic heart failure ([place])   . COPD (chronic obstructive pulmonary disease) ([place])    a. suspected COPD.  Marland Kitchen Coronary artery calcification seen on CT scan   . Essential hypertension   . GERD (gastroesophageal reflux disease)   . History of bronchitis   . Hyperlipidemia   . Iron deficiency anemia 05/12/2016   bleeding ulcers  . PSVT (paroxysmal supraventricular tachycardia) ([place])    a. first noted on tele during adm 7-05/2016.  Marland Kitchen PVC's (premature ventricular contractions)    a. first noted on tele during adm 7-05/2016.  . Substance abuse   . Thrombocytosis ([place])   . Ulcer     Patient Active Problem List   Diagnosis Date Noted  . Vitamin D deficiency 01/15/2017  . PAD (peripheral artery disease) ([place]) 01/15/2017  . Aortic atherosclerosis ([place]) 01/01/2017  . Gastric ulcer 01/01/2017  . HLD (hyperlipidemia) 01/01/2017  . Dyspnea on effort 01/01/2017  . 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 Anemia, iron deficiency  05/12/2016  . Cardiomyopathy- etiology not yet determined 05/02/2016  . CAD-Ca++ coronaries on CTA 05/02/2016  . Acute combined systolic and diastolic heart failure ([place]) 04/30/2016  . COPD exacerbation ([place]) 04/30/2016  . Essential hypertension 04/30/2016  . Tobacco use disorder 04/30/2016    Past Surgical History:  Procedure Laterality Date  . ABDOMINAL HYSTERECTOMY     polyps  about age 67  . Barbie Banner OSTEOTOMY Right 07/10/2013   Procedure: Barbie Banner OSTEOTOMY RIGHT FOOT;  Surgeon: Marcheta Grammes, DPM;  Location: [facility] ORS;  Service: Orthopedics;  Laterality: Right;  . BUNIONECTOMY Right 07/10/2013   Procedure: VOGLER BUNIONECTOMY RIGHT FOOT;  Surgeon: Marcheta Grammes, DPM;  Location: [facility] ORS;  Service: Orthopedics;  Laterality: Right;  . COLONOSCOPY N/A 07/07/2016   Procedure: COLONOSCOPY;  Surgeon: Danie Binder, MD;  Location: [facility] ENDO SUITE;  Service: Endoscopy;  Laterality: N/A;  2:00 pm - moved to 10:30 - office notified pt  . ESOPHAGOGASTRODUODENOSCOPY N/A 07/07/2016   Procedure: ESOPHAGOGASTRODUODENOSCOPY (EGD);  Surgeon: Danie Binder, MD;  Location: [facility] ENDO SUITE;  Service: Endoscopy;  Laterality: N/A;  . IR RADIOLOGIST EVAL & MGMT  12/05/2016  . METATARSAL HEAD EXCISION Right 07/10/2013   Procedure: METATARSAL HEAD RESECTION OF DIGITS 2 AND 3 RIGHT FOOT;  Surgeon: Marcheta Grammes, DPM;  Location: [facility] ORS;  Service: Orthopedics;  Laterality: Right;  . PROXIMAL  INTERPHALANGEAL FUSION (PIP) Right 07/10/2013   Procedure: ARTHRODESIS PIPJ  2ND DIGIT RIGHT FOOT;  Surgeon: Marcheta Grammes, DPM;  Location: [facility] ORS;  Service: Orthopedics;  Laterality: Right;    OB History    No data available       Home Medications    Prior to Admission medications   Medication Sig Start Date End Date Taking? Authorizing Provider  Acetaminophen 500 MG coapsule Take 1 capsule by mouth every 4 (four) hours as needed for fever.    [provider]  ALPRAZolam Duanne Moron) 0.5 MG  tablet Take 0.5 tablets (0.25 mg total) by mouth 2 (two) times daily as needed for anxiety. 01/15/17   Raylene Everts, MD  aspirin 81 MG chewable tablet Chew 81 mg by mouth daily.    [provider]  atorvastatin (LIPITOR) 20 MG tablet Take 1 tablet (20 mg total) by mouth daily. 01/01/17   Raylene Everts, MD  bisoprolol (ZEBETA) 10 MG tablet Take 1 tablet (10 mg total) by mouth daily. 06/06/16   Lendon Colonel, NP  cholecalciferol (VITAMIN D) 1000 units tablet Take 2,000 Units by mouth daily.    [provider]  furosemide (LASIX) 20 MG tablet Take 1 tablet (20 mg total) by mouth daily. 06/06/16   Lendon Colonel, NP  losartan (COZAAR) 100 MG tablet Take 1 tablet (100 mg total) by mouth daily. 01/01/17   Raylene Everts, MD  nicotine (NICOTROL) 10 MG inhaler Inhale 1 Cartridge (1 continuous puffing total) into the lungs as needed for smoking cessation. 03/30/17   Raylene Everts, MD  omeprazole (PRILOSEC) 20 MG capsule 1 po every morning 30 minutes prior to your first meal. 03/30/17   Raylene Everts, MD  PROAIR HFA 108 [phone] Base) MCG/ACT inhaler INHALE 2 PUFFS INTO THE LUNGS EVERY 6 HOURS AS NEEDED FOR WHEEZING ORSHORTNESS OF BREATH. 11/03/16   Lendon Colonel, NP  spironolactone (ALDACTONE) 25 MG tablet Take 0.5 tablets (12.5 mg total) by mouth daily. 06/06/16   Lendon Colonel, NP  tiZANidine (ZANAFLEX) 4 MG tablet Take 1 tablet (4 mg total) by mouth at bedtime. May take 2 if needed 03/30/17   Raylene Everts, MD    Family History Family History  Problem Relation Age of Onset  . Adopted: Yes  . Hypertension Father   . Coronary artery disease Sister     Social History Social History  Substance Use Topics  . Smoking status: Current Every Day Smoker    Packs/day: 0.25    Years: 37.00    Types: Cigarettes    Start date: 05/10/1975  . Smokeless tobacco: Never Used  . Alcohol use No     Allergies   Codeine; Penicillins; and Sudafed [pseudoephedrine  hcl]   Review of Systems Review of Systems  Constitutional: Positive for activity change.  Respiratory: Negative for shortness of breath.   Cardiovascular: Negative for chest pain.  Skin: Positive for rash.  All other systems reviewed and are negative.    Physical Exam Updated Vital Signs BP 129/78   Pulse (!) 108   Temp 97.7 F (36.5 C) (Oral)   Resp 18   SpO2 98%   Physical Exam  Constitutional: She is oriented to person, place, and time. She appears well-developed.  HENT:  Head: Normocephalic and atraumatic.  Eyes: EOM are normal.  Neck: Normal range of motion. Neck supple.  Cardiovascular: Normal rate and intact distal pulses.   Palpable DP and PT - confirmed with  dopplers  Pulmonary/Chest: Effort normal.  Abdominal: Bowel sounds are normal.  Neurological: She is alert and oriented to person, place, and time.  Skin: Skin is warm and dry. Rash noted. There is erythema.  There is streaking going from the proximal foot medially to the 5th toe, and at the plantar region of the 5th toe there is hyperpigmentation.  Nursing note and vitals reviewed.          ED Treatments / Results  Labs (all labs ordered are listed, but only abnormal results are displayed) Labs Reviewed  BASIC METABOLIC PANEL - Abnormal; Notable for the following:       Result Value   CO2 21 (*)    Glucose, Bld 152 (*)    All other components within normal limits  CBC WITH DIFFERENTIAL/PLATELET - Abnormal; Notable for the following:    WBC 15.5 (*)    Hemoglobin 8.8 (*)    HCT 29.5 (*)    MCV 76.2 (*)    MCH 22.7 (*)    MCHC 29.8 (*)    RDW 15.9 (*)    Platelets 551 (*)    Neutro Abs 11.9 (*)    Monocytes Absolute 1.7 (*)    All other components within normal limits  I-STAT CG4 LACTIC ACID, ED - Abnormal; Notable for the following:    Lactic Acid, Venous 2.05 (*)    All other components within normal limits    EKG  EKG Interpretation None       Radiology No results  found.  April 03, 2017 - CT scan  Left lower extremity:  Small caliber left common iliac artery measuring approximately 7 mm. Mixed soft plaque and calcified plaque with no significant stenosis. Hypogastric artery appears patent. External iliac artery patent of small caliber with mild atherosclerotic changes.  Common femoral artery patent. Lateral circumflex femoral artery and the profunda femoris patent.  Mild mixed soft plaque and calcified plaque of the left superficial femoral artery. There is developing stenosis in the mid third just above the canal. SFA patent through the adductor canal. Popliteal artery patent to the trifurcation.  Proximal anterior tibial artery and the tibioperoneal trunk patent.  Proximal posterior tibial artery and peroneal artery patent.  Distally there is poor filling of the peroneal artery. The anterior tibial artery and posterior tibial artery appear patent at the ankle.  Veins: Unremarkable appearance of the venous system.  Procedures Procedures (including critical care time)  Medications Ordered in ED Medications  acetaminophen (TYLENOL) tablet 650 mg (650 mg Oral Given 04/19/17 1431)     Initial Impression / Assessment and Plan / ED Course  I have reviewed the triage vital signs and the nursing notes.  Pertinent labs & imaging results that were available during my care of the patient were reviewed by me and considered in my medical decision making (see chart for details).  Clinical Course as of Apr 19 1549  Thu Apr 19, 2017  1549 Vascular surgery / IR have assessed and set up an outpatient treatment plan. Strict ER return precautions have been discussed, and patient is agreeing with the plan and is comfortable with the workup done and the recommendations from the ER.   [AN]    Clinical Course User Index [AN] Varney Biles, MD    Pt comes in with cc of foot pain. Pt has hx of PVD. Pt appears to be having gangrene in her  toes and there is red streaking from the site.  Vascular studies just few  days ago were neg for acute process, but there certainly was flow limitations.  Embolic event also possible. Pt has no hx of AF however. We will speak with IR - who is managing her vascular disease to get insight on the next best step.  Final Clinical Impressions(s) / ED Diagnoses   Final diagnoses:  Gangrene of toe (HCC)  Ischemic toe    New Prescriptions New Prescriptions   No medications on file     Varney Biles, MD 04/19/17 1550

## 2017-04-19 NOTE — ED Notes (Signed)
ED Provider at bedside. 

## 2017-04-19 NOTE — ED Notes (Signed)
Pt requesting a tylenol

## 2017-04-19 NOTE — ED Triage Notes (Signed)
Pt presents with c/o right foot pain. The pain began about two weeks ago and has been increasingly worse since onset. The pain is in all of her toes and she has noticed a reddish-blue discoloration of the toes this week. Her doctor referred her to the emergency department for further evaluation.

## 2017-04-20 ENCOUNTER — Other Ambulatory Visit (HOSPITAL_COMMUNITY): Payer: Self-pay | Admitting: Interventional Radiology

## 2017-04-20 DIAGNOSIS — I739 Peripheral vascular disease, unspecified: Secondary | ICD-10-CM

## 2017-04-23 ENCOUNTER — Other Ambulatory Visit: Payer: Self-pay | Admitting: Physician Assistant

## 2017-04-24 ENCOUNTER — Telehealth: Payer: Self-pay | Admitting: Family Medicine

## 2017-04-24 ENCOUNTER — Encounter (HOSPITAL_COMMUNITY): Payer: Self-pay

## 2017-04-24 ENCOUNTER — Observation Stay (HOSPITAL_COMMUNITY)
Admission: RE | Admit: 2017-04-24 | Discharge: 2017-04-25 | Disposition: A | Discharge status: Home / Self Care | Payer: BLUE CROSS/BLUE SHIELD | Source: Ambulatory Visit | Attending: Interventional Radiology | Admitting: Interventional Radiology

## 2017-04-24 ENCOUNTER — Other Ambulatory Visit (HOSPITAL_COMMUNITY): Payer: Self-pay | Admitting: Interventional Radiology

## 2017-04-24 DIAGNOSIS — I739 Peripheral vascular disease, unspecified: Secondary | ICD-10-CM

## 2017-04-24 DIAGNOSIS — I75022 Atheroembolism of left lower extremity: Secondary | ICD-10-CM | POA: Diagnosis not present

## 2017-04-24 DIAGNOSIS — I10 Essential (primary) hypertension: Secondary | ICD-10-CM | POA: Diagnosis not present

## 2017-04-24 DIAGNOSIS — J449 Chronic obstructive pulmonary disease, unspecified: Secondary | ICD-10-CM | POA: Insufficient documentation

## 2017-04-24 DIAGNOSIS — K219 Gastro-esophageal reflux disease without esophagitis: Secondary | ICD-10-CM | POA: Insufficient documentation

## 2017-04-24 DIAGNOSIS — I471 Supraventricular tachycardia: Secondary | ICD-10-CM | POA: Diagnosis not present

## 2017-04-24 DIAGNOSIS — E785 Hyperlipidemia, unspecified: Secondary | ICD-10-CM | POA: Diagnosis not present

## 2017-04-24 DIAGNOSIS — I2510 Atherosclerotic heart disease of native coronary artery without angina pectoris: Secondary | ICD-10-CM | POA: Diagnosis not present

## 2017-04-24 DIAGNOSIS — I429 Cardiomyopathy, unspecified: Secondary | ICD-10-CM | POA: Diagnosis not present

## 2017-04-24 DIAGNOSIS — F17210 Nicotine dependence, cigarettes, uncomplicated: Secondary | ICD-10-CM | POA: Insufficient documentation

## 2017-04-24 HISTORY — PX: IR INFUSION THROMBOL ARTERIAL INITIAL (MS): IMG5376

## 2017-04-24 HISTORY — PX: IR FEM POP ART ATHERECT INC PTA MOD SED: IMG2310

## 2017-04-24 HISTORY — PX: IR ANGIOGRAM EXTREMITY LEFT: IMG651

## 2017-04-24 HISTORY — PX: IR US GUIDE VASC ACCESS RIGHT: IMG2390

## 2017-04-24 LAB — CBC
HCT: 28.8 % — ABNORMAL LOW (ref 36.0–46.0)
Hemoglobin: 8.7 g/dL — ABNORMAL LOW (ref 12.0–15.0)
MCH: 22.8 pg — ABNORMAL LOW (ref 26.0–34.0)
MCHC: 30.2 g/dL (ref 30.0–36.0)
MCV: 75.4 fL — ABNORMAL LOW (ref 78.0–100.0)
PLATELETS: 577 10*3/uL — AB (ref 150–400)
RBC: 3.82 MIL/uL — AB (ref 3.87–5.11)
RDW: 16.2 % — ABNORMAL HIGH (ref 11.5–15.5)
WBC: 15.5 10*3/uL — ABNORMAL HIGH (ref 4.0–10.5)

## 2017-04-24 LAB — BASIC METABOLIC PANEL
Anion gap: 10 (ref 5–15)
BUN: 8 mg/dL (ref 6–20)
CHLORIDE: 107 mmol/L (ref 101–111)
CO2: 22 mmol/L (ref 22–32)
CREATININE: 0.83 mg/dL (ref 0.44–1.00)
Calcium: 9.2 mg/dL (ref 8.9–10.3)
GFR calc Af Amer: >60 mL/min (ref >60)
GFR calc non Af Amer: >60 mL/min (ref >60)
GLUCOSE: 121 mg/dL — AB (ref 65–99)
Potassium: 4.1 mmol/L (ref 3.5–5.1)
SODIUM: 139 mmol/L (ref 135–145)

## 2017-04-24 LAB — PROTIME-INR
INR: 0.98
PROTHROMBIN TIME: 13 s (ref 11.4–15.2)

## 2017-04-24 LAB — POCT ACTIVATED CLOTTING TIME: Activated Clotting Time: 169 seconds

## 2017-04-24 LAB — APTT: APTT: 28 s (ref 24–36)

## 2017-04-24 MED ORDER — HYDROMORPHONE HCL 1 MG/ML IJ SOLN
0.5000 mg | INTRAMUSCULAR | Status: DC | PRN
  Filled 2017-04-24: qty 0.5

## 2017-04-24 MED ORDER — HYDROMORPHONE HCL 1 MG/ML IJ SOLN: 0.5000 mg | Freq: Once | INTRAMUSCULAR | Status: DC

## 2017-04-24 MED ORDER — NICOTINE 7 MG/24HR TD PT24
7.0000 mg | MEDICATED_PATCH | Freq: Every day | TRANSDERMAL | Status: DC
  Filled 2017-04-24: qty 1

## 2017-04-24 MED ORDER — IOPAMIDOL (ISOVUE-300) INJECTION 61%
INTRAVENOUS | Status: AC
  Filled 2017-04-24: qty 100

## 2017-04-24 MED ORDER — HYDROMORPHONE HCL 1 MG/ML IJ SOLN
0.5000 mg | Freq: Once | INTRAMUSCULAR | Status: AC
  Administered 2017-04-24: 0.5 mg via INTRAVENOUS

## 2017-04-24 MED ORDER — ALTEPLASE 2 MG IJ SOLR
INTRAMUSCULAR | Status: AC
  Administered 2017-04-24: 4 mg via INTRA_ARTERIAL
  Filled 2017-04-24: qty 4

## 2017-04-24 MED ORDER — FENTANYL CITRATE (PF) 100 MCG/2ML IJ SOLN
INTRAMUSCULAR | Status: AC
  Filled 2017-04-24: qty 6

## 2017-04-24 MED ORDER — ONDANSETRON HCL 4 MG/2ML IJ SOLN: 4.0000 mg | Freq: Four times a day (QID) | INTRAMUSCULAR | Status: DC | PRN

## 2017-04-24 MED ORDER — CLOPIDOGREL BISULFATE 75 MG PO TABS
300.0000 mg | ORAL_TABLET | Freq: Once | ORAL | Status: AC
  Administered 2017-04-24: 300 mg via ORAL

## 2017-04-24 MED ORDER — ALTEPLASE 2 MG IJ SOLR
INTRAMUSCULAR | Status: AC
  Administered 2017-04-24: 2 mg
  Filled 2017-04-24: qty 2

## 2017-04-24 MED ORDER — FUROSEMIDE 20 MG PO TABS
20.0000 mg | ORAL_TABLET | Freq: Every day | ORAL | Status: DC
  Administered 2017-04-25: 20 mg via ORAL
  Filled 2017-04-24: qty 1

## 2017-04-24 MED ORDER — CLOPIDOGREL BISULFATE 75 MG PO TABS
75.0000 mg | ORAL_TABLET | Freq: Every day | ORAL | Status: DC
  Administered 2017-04-25: 75 mg via ORAL
  Filled 2017-04-24: qty 1

## 2017-04-24 MED ORDER — LIDOCAINE HCL (PF) 1 % IJ SOLN
INTRAMUSCULAR | Status: AC | PRN
  Administered 2017-04-24: 5 mL

## 2017-04-24 MED ORDER — SODIUM CHLORIDE 0.9 % IV SOLN
INTRAVENOUS | Status: DC
  Administered 2017-04-24: 08:00:00 via INTRAVENOUS

## 2017-04-24 MED ORDER — SODIUM CHLORIDE 0.9 % IV SOLN: INTRAVENOUS | Status: AC

## 2017-04-24 MED ORDER — PANTOPRAZOLE SODIUM 20 MG PO TBEC: 20.0000 mg | DELAYED_RELEASE_TABLET | Freq: Every day | ORAL | 3 refills | Status: DC

## 2017-04-24 MED ORDER — HEPARIN SODIUM (PORCINE) 1000 UNIT/ML IJ SOLN
INTRAMUSCULAR | Status: AC | PRN
  Administered 2017-04-24: 2000 [IU] via INTRAVENOUS
  Administered 2017-04-24: 3000 [IU] via INTRAVENOUS

## 2017-04-24 MED ORDER — IOPAMIDOL (ISOVUE-300) INJECTION 61%
INTRAVENOUS | Status: AC
  Administered 2017-04-24: 75 mL
  Filled 2017-04-24: qty 100

## 2017-04-24 MED ORDER — HEPARIN SODIUM (PORCINE) 1000 UNIT/ML IJ SOLN
INTRAMUSCULAR | Status: AC
  Filled 2017-04-24: qty 1

## 2017-04-24 MED ORDER — MIDAZOLAM HCL 2 MG/2ML IJ SOLN
INTRAMUSCULAR | Status: AC
  Filled 2017-04-24: qty 6

## 2017-04-24 MED ORDER — ALBUTEROL SULFATE (2.5 MG/3ML) 0.083% IN NEBU
3.0000 mL | INHALATION_SOLUTION | Freq: Four times a day (QID) | RESPIRATORY_TRACT | Status: DC | PRN
  Administered 2017-04-25: 3 mL via RESPIRATORY_TRACT
  Filled 2017-04-24: qty 3

## 2017-04-24 MED ORDER — FENTANYL CITRATE (PF) 100 MCG/2ML IJ SOLN
INTRAMUSCULAR | Status: AC | PRN
  Administered 2017-04-24: 25 ug via INTRAVENOUS
  Administered 2017-04-24 (×2): 50 ug via INTRAVENOUS
  Administered 2017-04-24 (×2): 25 ug via INTRAVENOUS
  Administered 2017-04-24: 50 ug via INTRAVENOUS
  Administered 2017-04-24: 25 ug via INTRAVENOUS

## 2017-04-24 MED ORDER — LOSARTAN POTASSIUM 50 MG PO TABS
100.0000 mg | ORAL_TABLET | Freq: Every day | ORAL | Status: DC
  Administered 2017-04-25: 100 mg via ORAL
  Filled 2017-04-24: qty 2

## 2017-04-24 MED ORDER — NITROGLYCERIN 1 MG/10 ML FOR IR/CATH LAB
INTRA_ARTERIAL | Status: AC
  Filled 2017-04-24: qty 10

## 2017-04-24 MED ORDER — SPIRONOLACTONE 12.5 MG HALF TABLET
12.5000 mg | ORAL_TABLET | Freq: Every day | ORAL | Status: DC
  Administered 2017-04-25: 12.5 mg via ORAL
  Filled 2017-04-24: qty 1

## 2017-04-24 MED ORDER — IOPAMIDOL (ISOVUE-300) INJECTION 61%
INTRAVENOUS | Status: AC
  Administered 2017-04-24: 60 mL
  Filled 2017-04-24: qty 150

## 2017-04-24 MED ORDER — BISOPROLOL FUMARATE 10 MG PO TABS
10.0000 mg | ORAL_TABLET | Freq: Every day | ORAL | Status: DC
  Administered 2017-04-25: 10 mg via ORAL
  Filled 2017-04-24: qty 1

## 2017-04-24 MED ORDER — KETOROLAC TROMETHAMINE 30 MG/ML IJ SOLN: 30.0000 mg | Freq: Once | INTRAMUSCULAR | Status: DC | PRN

## 2017-04-24 MED ORDER — TIZANIDINE HCL 4 MG PO TABS
4.0000 mg | ORAL_TABLET | Freq: Every day | ORAL | Status: DC
  Administered 2017-04-24: 4 mg via ORAL
  Filled 2017-04-24: qty 1

## 2017-04-24 MED ORDER — LIDOCAINE HCL (PF) 1 % IJ SOLN
INTRAMUSCULAR | Status: AC
  Administered 2017-04-24: 10:00:00
  Filled 2017-04-24: qty 30

## 2017-04-24 MED ORDER — NICOTINE 10 MG IN INHA: 1.0000 | RESPIRATORY_TRACT | Status: DC | PRN

## 2017-04-24 MED ORDER — MIDAZOLAM HCL 2 MG/2ML IJ SOLN
INTRAMUSCULAR | Status: AC | PRN
  Administered 2017-04-24 (×4): 1 mg via INTRAVENOUS

## 2017-04-24 MED ORDER — HYDROMORPHONE HCL 1 MG/ML IJ SOLN
INTRAMUSCULAR | Status: AC
  Filled 2017-04-24: qty 0.5

## 2017-04-24 MED ORDER — ASPIRIN 81 MG PO CHEW
81.0000 mg | CHEWABLE_TABLET | Freq: Every day | ORAL | Status: DC
  Administered 2017-04-25: 81 mg via ORAL
  Filled 2017-04-24: qty 1

## 2017-04-24 MED ORDER — KETOROLAC TROMETHAMINE 30 MG/ML IJ SOLN
30.0000 mg | Freq: Four times a day (QID) | INTRAMUSCULAR | Status: DC | PRN
  Administered 2017-04-24: 30 mg via INTRAVENOUS
  Filled 2017-04-24: qty 1

## 2017-04-24 MED ORDER — ATORVASTATIN CALCIUM 20 MG PO TABS
20.0000 mg | ORAL_TABLET | Freq: Every day | ORAL | Status: DC
  Administered 2017-04-25: 20 mg via ORAL
  Filled 2017-04-24: qty 1

## 2017-04-24 MED ORDER — ACETAMINOPHEN 500 MG PO TABS: 500.0000 mg | ORAL_TABLET | ORAL | Status: DC | PRN

## 2017-04-24 MED ORDER — CLOPIDOGREL BISULFATE 75 MG PO TABS
ORAL_TABLET | ORAL | Status: AC
  Filled 2017-04-24: qty 4

## 2017-04-24 NOTE — Progress Notes (Signed)
Pt is resting sleeping. Groin is stable and pressure dsg is intact. VSS, Pt is being monitored by telemetry.

## 2017-04-24 NOTE — Progress Notes (Signed)
Report given to [facility]. Upon bedside assessment with SS RN Lattie Haw pt complains of pain at right groin site. Pt is very upset and crying. Heather, IR Tech at bedside to assess site. Dr Earleen Newport unavailable at this time.

## 2017-04-24 NOTE — Progress Notes (Signed)
Pt still reporting 10/10 groin/leg pain. No further bleeding from groin site Leg warm, sensation intact.  Given persistent pain, will plan to admit for overnight observation. Pt and family in agreement as they do live about 30 minutes away in the event of an emergency at home.  Ascencion Dike PA-C Interventional Radiology 04/24/2017 4:47 PM

## 2017-04-24 NOTE — Sedation Documentation (Signed)
Patient is resting comfortably. 

## 2017-04-24 NOTE — Sedation Documentation (Signed)
Pt states her pain in her toe is better but continues to rate the pain a 7 or 8 out of 10.

## 2017-04-24 NOTE — Sedation Documentation (Addendum)
Pt rates her toe pain a 10 out of 10. Medication given.

## 2017-04-24 NOTE — Sedation Documentation (Signed)
Pt became more alert and was re-oriented.  Pt continues to report toe pain.  Pt was informed we would make her comfortable but may not be able to relieve her pain completely.

## 2017-04-24 NOTE — Progress Notes (Signed)
Upon assessment of pt's groin during handoff with radiology RN, , pt noted to have hard ridge at top of dressing very painful to touch. Much firmer than pt;s other groin and half moon to upper half of thigh and lower abd.. Pressure dsg removed and pressure held by Irving Burton until [place]  IR tech arrived and took over pressure.  Pt complains that (L) toe pain is 20 out of 10 and groin pressure is unbearable.  Dr. Earleen Newport has been notified and is coming to assess pt.

## 2017-04-24 NOTE — Progress Notes (Signed)
ANAYELLI LAI [id number] Admission Data: 04/24/2017 6:13 PM Attending Provider: Corrie Mckusick, DO  POE:[id number], Jennette Banker, MD Consults/ Treatment Team: Treatment Team:  D[facility]er Radiology, MD  DAYRIN STALLONE is a 61 y.o. female patient admitted from ED awake, alert  & orientated  X 3,  Prior, VSS - Blood pressure (!) 183/46, pulse 77, temperature 97.8 F (36.6 C), resp. rate 14, height 5\' 5"  (1.651 m), weight 63.5 kg (140 lb), SpO2 95 %.,  Tele # 6 placed and pt is currently running:normal sinus rhythm.   IV site WDL:  hand right, condition patent and no redness with a transparent dsg that's clean dry and intact.  Allergies:   Allergies  Allergen Reactions  . Bee Venom Swelling  . Codeine Anaphylaxis    Tongue swelled  . Penicillins Swelling and Rash    Has patient had a PCN reaction causing immediate rash, facial/tongue/throat swelling, SOB or lightheadedness with hypotension: yes Has patient had a PCN reaction causing severe rash involving mucus membranes or skin necrosis: No Has patient had a PCN reaction that required hospitalization No Has patient had a PCN reaction occurring within the last 10 years: No If all of the above answers are "NO", then may proceed with Cephalosporin use.   Ebbie [place] [Pseudoephedrine Hcl] Rash     Past Medical History:  Diagnosis Date  . Allergy    pollen  . Anemia 04/30/2016  . Cardiomyopathy [facility])    Diagnosed July 2017  . Combined congestive systolic and diastolic heart failure ([place])   . COPD (chronic obstructive pulmonary disease) ([place])    a. suspected COPD.  Marland Kitchen Coronary artery calcification seen on CT scan   . Essential hypertension   . GERD (gastroesophageal reflux disease)   . History of bronchitis   . Hyperlipidemia   . Iron deficiency anemia 05/12/2016   bleeding ulcers  . PSVT (paroxysmal supraventricular tachycardia) ([place])    a. first noted on tele during adm 7-05/2016.  Marland Kitchen PVC's (premature ventricular contractions)    a. first noted on tele during adm 7-05/2016.  . Substance abuse   . Thrombocytosis ([place])   . Ulcer     History:  obtained from the patient. Tobacco/alcohol: Smokes 5 cigarettes a day for last 45 years Alcohol: none  Pt orientation to unit, room and routine. Information packet given to patient/family and safety video watched.  Admission INP armband ID verified with patient/family, and in place. SR up x 2, fall risk assessment complete with Patient and family verbalizing understanding of risks associated with falls. Pt verbalizes an understanding of how to use the call bell and to call for help before getting out of bed.  Patient's L 5th toe black.     Will cont to monitor and assist as needed.  Johm Pfannenstiel Margaretha Sheffield, RN 04/24/2017 6:13 PM

## 2017-04-24 NOTE — Telephone Encounter (Signed)
Left message for pt

## 2017-04-24 NOTE — Telephone Encounter (Signed)
Done

## 2017-04-24 NOTE — Progress Notes (Signed)
Dr Earleen Newport at bedside to assess pt.

## 2017-04-24 NOTE — Progress Notes (Signed)
Per Dr Earleen Newport, reapply pressure dressing and he will prescribe pain medication.

## 2017-04-24 NOTE — Progress Notes (Signed)
Tammy Sabin RN at bedside, along with Madelyn Brunner RN. Pressure dressing reapplied. Pt states she is still having some pain but the pain is better than previously. Pt rates her pain 10/10. Pt husband at bedside. Verbalizes understanding of Dr Pasty Arch instructions. Vitals stable. Dressing to Right groin CDI.

## 2017-04-24 NOTE — Telephone Encounter (Signed)
New Message  Pt c/o medication issue:  1. Name of Medication: plavix interacting with omeprazole (Prilosec) 20 mg capsulse  2. How are you currently taking this medication (dosage and times per day)? 75 mg tablets  3. Are you having a reaction (difficulty breathing--STAT)? N/A  4. What is your medication issue?  Per pts husband and pharmacist voiced Plavix interacts with omeprazole (Prilosec) 20 mg capsule and wanting to know if another medication can be prescribed

## 2017-04-24 NOTE — Sedation Documentation (Signed)
Pt ACT was 169.

## 2017-04-24 NOTE — Progress Notes (Signed)
Dressing removed to assess right groin site.

## 2017-04-24 NOTE — Sedation Documentation (Signed)
Pt continues to state her toe hurts.

## 2017-04-24 NOTE — Telephone Encounter (Signed)
Change to pantoprazole, same dose

## 2017-04-24 NOTE — H&P (Signed)
Chief Complaint: SFA occlusion of LLE  Supervising Physician: Tammy Mcpherson  Patient Status: [facility] - Out-pt  HPI: Tammy Mcpherson is a 61 y.o. female well-known to Dr. Earleen Mcpherson and is followed in the clinic.  She was seen by him in June and underwent a CTA of her left leg to evaluate her PAD.  She was found to have SFA disease, greatest on the left side with focal stenosis.  The patient at that time did not want to really pursue an invasive procedure such as an angiogram and was told to maximize medical therapy and to RTC in 6 months for evaluation.  However, over the last 2 weeks she states the pain in her toes on the left foot have worsened.  Within the last week it has worsened even more.  She noticed that there were some color changes to the bottom of her toes and so presented to the [facility] on 7/19 for evaluation.  She was felt to have some ischemia but nothing emergent as her foot was warm with good DP and PT pulses.  She was placed on plavix and brought back today for intervention for her leg.  Past Medical History:  Past Medical History:  Diagnosis Date  . Allergy    pollen  . Anemia 04/30/2016  . Cardiomyopathy [facility])    Diagnosed July 2017  . Combined congestive systolic and diastolic heart failure ([place])   . COPD (chronic obstructive pulmonary disease) ([place])    a. suspected COPD.  Marland Kitchen Coronary artery calcification seen on CT scan   . Essential hypertension   . GERD (gastroesophageal reflux disease)   . History of bronchitis   . Hyperlipidemia   . Iron deficiency anemia 05/12/2016   bleeding ulcers  . PSVT (paroxysmal supraventricular tachycardia) ([place])    a. first noted on tele during adm 7-05/2016.  Marland Kitchen PVC's (premature ventricular contractions)    a. first noted on tele during adm 7-05/2016.  . Substance abuse   . Thrombocytosis ([place])   . Ulcer     Past Surgical History:  Past Surgical History:  Procedure Laterality Date  . ABDOMINAL HYSTERECTOMY     polyps  about age 6  .  Barbie Banner OSTEOTOMY Right 07/10/2013   Procedure: Barbie Banner OSTEOTOMY RIGHT FOOT;  Surgeon: Marcheta Grammes, DPM;  Location: [facility] ORS;  Service: Orthopedics;  Laterality: Right;  . BUNIONECTOMY Right 07/10/2013   Procedure: VOGLER BUNIONECTOMY RIGHT FOOT;  Surgeon: Marcheta Grammes, DPM;  Location: [facility] ORS;  Service: Orthopedics;  Laterality: Right;  . COLONOSCOPY N/A 07/07/2016   Procedure: COLONOSCOPY;  Surgeon: Danie Binder, MD;  Location: [facility] ENDO SU[facility];  Service: Endoscopy;  Laterality: N/A;  2:00 pm - moved to 10:30 - office notified pt  . ESOPHAGOGASTRODUODENOSCOPY N/A 07/07/2016   Procedure: ESOPHAGOGASTRODUODENOSCOPY (EGD);  Surgeon: Danie Binder, MD;  Location: [facility] END[facility];  Service: Endoscopy;  Laterality: N/A;  . IR RADIOLOGIST EVAL & MGMT  12/05/2016  . METATARSAL HEAD EXCISION Right 07/10/2013   Procedure: METATARSAL HEAD RESECTION OF DIGITS 2 AND 3 RIGHT FOOT;  Surgeon: Marcheta Grammes, DPM;  Location: [facility];  Service: Orthopedics;  Laterality: Right;  . PROXIMAL INTERPHALANGEAL FUSION (PIP) Right 07/10/2013   Procedure: ARTHRODESIS PIPJ  2ND DIGIT RIGHT FOOT;  Surgeon: Marcheta Grammes, DPM;  Location: [facility];  Service: Orthopedics;  Laterality: Right;    Family History:  Family History  Problem Relation Age of Onset  . Adopted: Yes  . Hypertension Father   .  Coronary artery disease Sister     Social History:  reports that she has been smoking Cigarettes.  She started smoking about 41 years ago. She has a 9.25 pack-year smoking history. She has never used smokeless tobacco. She reports that she does not drink alcohol or use drugs.  Allergies:  Allergies  Allergen Reactions  . Bee Venom Swelling  . Codeine Anaphylaxis    Tongue swelled  . Penicillins Swelling and Rash    Has patient had a PCN reaction causing immediate rash, facial/tongue/throat swelling, SOB or lightheadedness with hypotension: yes Has patient had a PCN reaction causing severe rash  involving mucus membranes or skin necrosis: No Has patient had a PCN reaction that required hospitalization No Has patient had a PCN reaction occurring within the last 10 years: No If all of the above answers are "NO", then may proceed with Cephalosporin use.   [place] [Pseudoephedrine Hcl] Rash    Medications: Medications reviewed in epic  Please HPI for pertinent positives, otherwise complete 10 system ROS negative.  Mallampati Score: MD Evaluation Airway: WNL Heart: WNL Abdomen: WNL Chest/ Lungs: WNL ASA  Classification: 3 Mallampati/Airway Score: Two  Physical Exam: BP (!) 187/56   Pulse 80   Temp 97.7 F (36.5 C)   Resp 20   Ht 5' 5" (1.651 m)   Wt 140 lb (63.5 kg)   SpO2 100%   BMI 23.30 kg/m  Body mass index is 23.3 kg/m. General: pleasant, [name] female who is laying in bed in NAD HEENT: head is normocephalic, atraumatic.  Sclera are noninjected.  PERRL.  Ears and nose without any masses or lesions.  Mouth is pink and moist Heart: regular, rate, and rhythm.  Normal s1,s2. No obvious murmurs, gallops, or rubs noted.  Palpable radial and pedal pulses bilaterally Lungs: CTAB, no wheezes, rhonchi, or rales noted.  Respiratory effort nonlabored Abd: soft, NT, ND, +BS, no masses, hernias, or organomegaly MS: all 4 extremities are symmetrical with no cyanosis, clubbing, or edema. With the exception of the LLE foot.  She has some slight progression of ischemia to her 5th toe and some ischemic changes now noted on her deformed second toe.  Otherwise, no other changes since 7/19 Psych: A&Ox3 with an appropriate affect.   Labs: Results for orders placed or performed during the hospital encounter of 04/24/17 (from the past 48 hour(s))  Basic metabolic panel     Status: Abnormal   Collection Time: 04/24/17  7:12 AM  Result Value Ref Range   Sodium 139 135 - 145 mmol/L   Potassium 4.1 3.5 - 5.1 mmol/L   Chloride 107 101 - 111 mmol/L   CO2 22 22 - 32 mmol/L   Glucose,  Bld 121 (H) 65 - 99 mg/dL   BUN 8 6 - 20 mg/dL   Creatinine, Ser 0.83 0.44 - 1.00 mg/dL   Calcium 9.2 8.9 - 10.3 mg/dL   GFR calc non Af Amer >60 >60 mL/min   GFR calc Af Amer >60 >60 mL/min    Comment: (NOTE) The eGFR has been calculated using the CKD EPI equation. This calculation has not been validated in all clinical situations. eGFR's persistently <60 mL/min signify possible Chronic Kidney Disease.    Anion gap 10 5 - 15  APTT upon arrival     Status: None   Collection Time: 04/24/17  7:12 AM  Result Value Ref Range   aPTT 28 24 - 36 seconds  CBC upon arrival     Status: Abnormal  Collection Time: 04/24/17  7:12 AM  Result Value Ref Range   WBC 15.5 (H) 4.0 - 10.5 K/uL   RBC 3.82 (L) 3.87 - 5.11 MIL/uL   Hemoglobin 8.7 (L) 12.0 - 15.0 g/dL   HCT 28.8 (L) 36.0 - 46.0 %   MCV 75.4 (L) 78.0 - 100.0 fL   MCH 22.8 (L) 26.0 - 34.0 pg   MCHC 30.2 30.0 - 36.0 g/dL   RDW 16.2 (H) 11.5 - 15.5 %   Platelets 577 (H) 150 - 400 K/uL  Protime-INR upon arrival     Status: None   Collection Time: 04/24/17  7:12 AM  Result Value Ref Range   Prothrombin Time 13.0 11.4 - 15.2 seconds   INR 0.98     Imaging: No results found.  Assessment/Plan 1. SFA occlusion with some limb ischemia  We will plan today for LLE angiogram with intervention to treat this occlusion.  She is on her plavix and last took it yesterday.  She will continue on this post procedure as well.  Her labs and vitals have been reviewed.  Her WBC is likely elevated secondary to a reactive/inflammatory response to her ischemia as she has no other infectious symptoms.   Risks and Benefits discussed with the patient including, but not limited to bleeding, infection, vascular injury or contrast induced renal failure. All of the patient's questions were answered, patient is agreeable to proceed. Consent signed and in chart.  Thank you for this interesting consult.  I greatly enjoyed meeting Tammy Mcpherson and look forward to  participating in their care.  A copy of this report was sent to the requesting provider on this date.  Electronically Signed: Henreitta Cea 04/24/2017, 8:51 AM   I spent a total of    25 Minutes in face to face in clinical consultation, greater than 50% of which was counseling/coordinating care for LLE ischemia, SFA occlusion

## 2017-04-24 NOTE — Progress Notes (Signed)
Pt is in short stay. Received report from [place]. Groin level 0, pressure dressing clean dry and intact. Pt denies pain at site. Pt states that left pinky toe is sore, pt states the pain is the same as it was on arrival to hospital this morning. Pt vitals stable

## 2017-04-24 NOTE — Sedation Documentation (Signed)
TPA is mixed by [employer].

## 2017-04-24 NOTE — Progress Notes (Signed)
Phoned Dr Earleen Newport, he will come and assess pt at bedside.

## 2017-04-24 NOTE — Procedures (Signed)
Interventional Radiology Procedure Note   Procedure:   US guided RCFA access.  Bilateral runoff.  Atherectomy TASC A lesion SFA as the offending lesion. Intraoperative emolectomy/thrombectomy of tibial vessels.    Closure of RCFA with Exoseal.    Intraop meds: IV heparin 5000U, 6mg  IA tPA. 300mg  PO plavix  Complications: None  Recommendations:  - Right leg straight for 4 hours - Routine dry dressing on the access unless oozing.  Then manual pressure for 30 minutes, call PA/MD, and pressure dressing application - OK for advance diet. - OK for head of bed elevated to 30/reverse trendelenburg - Anticipate DC at 4pm - Follow up with Dr. Earleen Mcpherson in [place] - Follow up with Dr. [name] as scheduled.  - continue maximal medical   Signed,  Tammy Fanny. Earleen Newport, DO

## 2017-04-25 ENCOUNTER — Other Ambulatory Visit (HOSPITAL_COMMUNITY): Payer: Self-pay | Admitting: Student

## 2017-04-25 ENCOUNTER — Other Ambulatory Visit: Payer: Self-pay | Admitting: Interventional Radiology

## 2017-04-25 DIAGNOSIS — I739 Peripheral vascular disease, unspecified: Secondary | ICD-10-CM

## 2017-04-25 NOTE — Discharge Summary (Signed)
[name] MRN: [id number] DOB/AGE: [birth year] 61 y.o.  Admit date: 04/24/2017 Discharge date: 04/25/2017  Supervising Physician: Corrie Mckusick  Patient Status: [facility] - In-pt  Admission Diagnoses: Peripheral arterial disease  Discharge Diagnoses:  Active Problems:   PAD (peripheral artery disease) [facility])   Discharged Condition: good  Hospital Course: Tammy Mcpherson is a 61 y.o. female well-known to Dr. Earleen Newport and is followed in the clinic. She was last seen by him in June and underwent a CTA of her left leg to evaluate her PAD. She was found to have SFA disease, greatest on the left side with focal stenosis. Over the last 2 weeks she developed increased pain in her toes on the left foot with some color changes to the bottom of her toes.  She presented to the [facility] on 7/19 for evaluation and was found to have ischemia.  She was placed on plavix at home and returned to radiology department yesterday for angiogram with intervention.  Patient underwent succesful atherectomy TASC A lesion SFA with intraoperative embolectomy/thromectomy of the tibial vessels. Patient did have oozing from access site at CFA associated with pain.  This was manually reduced. Pressure dressing was applied and patient was admitted overnight for observation and pain control. Patient assessed this AM.  She denies complaint of pain other than her usual chronic pain in her left foot.  She denies pain or further oozing from puncture site.  She has been able eat her meals with tolerance.  She is able to get to the bathroom and has successfully voided.  She feels she is ready to go home.  She requests pain medication for use at home.  She is given a prescription for tramadol 50mg  to be used PRN. Patient is ready for discharge.   Consults: None  Discharge Exam: Blood pressure (!) 156/56, pulse 100, temperature 98.8 F (37.1 C), temperature source Oral, resp. rate 17, height 5\' 5"  (1.651 m), weight 139 lb 15.9  oz (63.5 kg), SpO2 99 %. General appearance: alert and no distress Resp: clear to auscultation bilaterally Cardio: regular rate and rhythm GI: soft, non-tender; bowel sounds normal; no masses,  no organomegaly Extremities: no edema, redness or tenderness in the calves or thighs Pulses: 2+ and symmetric Skin: Skin color, texture, turgor normal. No rashes or lesions.  Bruising to groin which appears stable.    Disposition: 01-Home or Self Care  Discharge Instructions    Call MD for:  persistant dizziness or light-headedness    Complete by:  As directed    Call MD for:  persistant nausea and vomiting    Complete by:  As directed    Call MD for:  redness, tenderness, or signs of infection (pain, swelling, redness, odor or green/yellow discharge around incision site)    Complete by:  As directed    Call MD for:  severe uncontrolled pain    Complete by:  As directed    Call MD for:  temperature >100.4    Complete by:  As directed    Diet - low sodium heart healthy    Complete by:  As directed    Discharge instructions    Complete by:  As directed    Do not drive if taking narcotic pain medication.  Keep puncture site clean and dry.  Increase activity slowly. See Dr. Earleen Newport in follow-up in ~4 weeks.  Keep appointments with Dr. [name] as scheduled. Continue taking aspirin and Plavix as prescribed.   Increase activity  slowly    Complete by:  As directed      Allergies as of 04/25/2017      Reactions   Bee Venom Swelling   Codeine Anaphylaxis   Tongue swelled   Penicillins Swelling, Rash   Has patient had a PCN reaction causing immediate rash, facial/tongue/throat swelling, SOB or lightheadedness with hypotension: yes Has patient had a PCN reaction causing severe rash involving mucus membranes or skin necrosis: No Has patient had a PCN reaction that required hospitalization No Has patient had a PCN reaction occurring within the last 10 years: No If all of the above answers are  "NO", then may proceed with Cephalosporin use.   Sudafed [pseudoephedrine Hcl] Rash      Medication List    TAKE these medications   Acetaminophen 500 MG coapsule Take 1 capsule by mouth every 4 (four) hours as needed for fever.   aspirin 81 MG chewable tablet Chew 81 mg by mouth daily.   atorvastatin 20 MG tablet Commonly known as:  LIPITOR Take 1 tablet (20 mg total) by mouth daily.   bisoprolol 10 MG tablet Commonly known as:  ZEBETA Take 1 tablet (10 mg total) by mouth daily.   cholecalciferol 1000 units tablet Commonly known as:  VITAMIN D Take 2,000 Units by mouth daily.   clopidogrel 75 MG tablet Commonly known as:  PLAVIX Take 75 mg by mouth daily.   furosemide 20 MG tablet Commonly known as:  LASIX Take 1 tablet (20 mg total) by mouth daily.   losartan 100 MG tablet Commonly known as:  COZAAR Take 1 tablet (100 mg total) by mouth daily.   nicotine 10 MG inhaler Commonly known as:  NICOTROL Inhale 1 Cartridge (1 continuous puffing total) into the lungs as needed for smoking cessation.   pantoprazole 20 MG tablet Commonly known as:  PROTONIX Take 1 tablet (20 mg total) by mouth daily.   PROAIR HFA 108 (90 Base) MCG/ACT inhaler Generic drug:  albuterol INHALE 2 PUFFS INTO THE LUNGS EVERY 6 HOURS AS NEEDED FOR WHEEZING ORSHORTNESS OF BREATH.   spironolactone 25 MG tablet Commonly known as:  ALDACTONE Take 0.5 tablets (12.5 mg total) by mouth daily.   tiZANidine 4 MG tablet Commonly known as:  ZANAFLEX Take 1 tablet (4 mg total) by mouth at bedtime. May take 2 if needed      Follow-up Information    Corrie Mckusick, DO Follow up.   Specialty:  Interventional Radiology Contact information: [address] [place] [address] [phone]            Electronically Signed: Docia Barrier, PA 04/25/2017, 10:20 AM   I have spent Greater Than 30 Minutes discharging Tammy Mcpherson.

## 2017-04-25 NOTE — Progress Notes (Signed)
Called by RN that patient was having trouble breathing. Upon arrival patient was sitting on side of bed, increased WOB. BBS were clear, diminished. No wheezing or rhonchi noted. RN getting vitals. On room air SPO2 was 100% and HR was 87, RR 22. Breathing treatment started.

## 2017-04-25 NOTE — Progress Notes (Signed)
Husband alerted RN that pt is having difficulties breathing.  On arrival at bedside, pt is sitting on side of bed labored breathing and respiration of 22.  Oxygen sat at 100%.  Vital sign taken and stable.  Respiratory therapist notified and breathing treatment given.  Will notify MD.

## 2017-04-25 NOTE — Discharge Instructions (Signed)
Angiogram An angiogram is an X-ray test. It is used to look at your blood vessels. For this test, a dye is put into the blood vessel being checked. The dye shows up on X-rays. It helps your doctor see if there is a blockage or other problem in the blood vessel. What happens before the procedure?  Follow your doctor's instructions about limiting what you eat or drink.  Ask your doctor if you may drink enough water to take any needed medicines the morning of the test.  Plan to have someone take you home after the test.  If you go home the same day as the test, plan to have someone stay with you for 24 hours. What happens during the procedure?  An IV tube will be put into one of your veins.  You will be given a medicine that makes you relax (sedative).  Your skin will be washed where the thin tube (catheter) will be put in. Hair may be removed from this area. The tube may be put into: ? Your upper leg area (groin). ? The fold of your arm, near your elbow. ? Your wrist.  You will be given a medicine that numbs the area where the tube will be inserted (local anesthetic).  The tube will be inserted into a blood vessel.  Using a type of X-ray (fluoroscopy) to see, your doctor will move the tube into the blood vessel to check it.  Dye will be put in through the tube. X-rays of your blood vessels will then be taken. Different doctors and hospitals may do this procedure differently. What happens after the procedure?  If the test is done through the leg, you will be kept in bed lying flat for several hours. You will be told to not bend or cross your legs.  The area where the tube was inserted will be checked often.  The pulse in your feet or wrist will be checked often.  More tests or X-rays may be done. This information is not intended to replace advice given to you by your health care provider. Make sure you discuss any questions you have with your health care provider. Document  Released: 12/15/2008 Document Revised: 02/24/2016 Document Reviewed: 02/19/2013 Elsevier Interactive Patient Education  2017 [employer]  Ankle-Brachial Index Test The ankle-brachial index (ABI) test is used to find peripheral vascular disease (PVD). PVD is also known as peripheral arterial disease (PAD). PVD is the blocking or hardening of the arteries anywhere within the circulatory system beyond the heart. PVD is caused by cholesterol deposits in your blood vessels (atherosclerosis). These deposits cause arteries to narrow. The delivery of oxygen to your tissues is impaired as a result. This can cause muscle pain and fatigue. This is called claudication. PVD means there may also be buildup of cholesterol in your:  Heart. This increases the risk of heart attacks.  Brain. This increases the risk of strokes.  The ankle-brachial index test measures the blood flow in your arms and legs. This test also determines if blood vessels in your leg are narrowed by cholesterol deposits. There are additional causes of a reduced ankle-brachial index, such as inflammation of vessels or a clot in the vessels. However, these are much less common than narrowing due to cholesterol deposits. What is being tested? The test is done while you are lying down and resting. Measurements are taken of the systolic pressure:  In your arm (brachial).  In your ankle at several points along your leg.  Systolic pressure is the pressure inside your arteries when your heart pumps. The measurements are taken several times on both sides. Then, the highest systolic pressure of the ankle is divided by the highest brachial systolic pressure. The result is the ankle-brachial pressure ratio, or ABI. Sometimes this test is repeated after you have exercised on a treadmill for five minutes. You may have leg pain during the exercise portion of the test if you suffer from PAD. If the index number drops after exercise, this may show that  PAD is present. A normal ABI ratio is between 0.9 and 1.4. A value below 0.9 is considered abnormal. This information is not intended to replace advice given to you by your health care provider. Make sure you discuss any questions you have with your health care provider. Document Released: 09/22/2004 Document Revised: 02/24/2016 Document Reviewed: 04/24/2014 Elsevier Interactive Patient Education  2018 [place]          An angiogram is an X-ray test. It is used to look at your blood vessels. For this test, a dye is put into the blood vessel being checked. The dye shows up on X-rays. It helps your doctor see if there is a blockage or other problem in the blood vessel.  What happens before the procedure?  Follow your doctor's instructions about limiting what you eat or drink.  Ask your doctor if you may drink enough water to take any needed medicines the morning of the test.  Plan to have someone take you home after the test.  If you go home the same day as the test, plan to have someone stay with you for 24 hours. What happens during the procedure?  An IV tube will be put into one of your veins.  You will be given a medicine that makes you relax (sedative).  Your skin will be washed where the thin tube (catheter) will be put in. Hair may be removed from this area. The tube may be put into: ? Your upper leg area (groin). ? The fold of your arm, near your elbow. ? Your wrist.  You will be given a medicine that numbs the area where the tube will be inserted (local anesthetic).  The tube will be inserted into a blood vessel.  Using a type of X-ray (fluoroscopy) to see, your doctor will move the tube into the blood vessel to check it.  Dye will be put in through the tube. X-rays of your blood vessels will then be taken. Different doctors and hospitals may do this procedure differently. What happens after the procedure?  If the test is done through the leg, you  will be kept in bed lying flat for several hours. You will be told to not bend or cross your legs.  The area where the tube was inserted will be checked often.  The pulse in your feet or wrist will be checked often.  More tests or X-rays may be done. This information is not intended to replace advice given to you by your health care provider. Make sure you discuss any questions you have with your health care provider. Document Released: 12/15/2008 Document Revised: 02/24/2016 Document Reviewed: 02/19/2013 Elsevier Interactive Patient Education  2017 [employer].

## 2017-05-07 NOTE — Patient Instructions (Addendum)
Tammy Mcpherson  05/07/2017     @PREFPERIOPPHARMACY @   Your procedure is scheduled on  05/09/2017   Report to [facility] at  750  A.M.  Call this number if you have problems the morning of surgery:  [phone]   Remember:  Do not eat food or drink liquids after midnight.  Take these medicines the morning of surgery with A SIP OF WATER  Bisoprolol, cozaar, protonix.   Do not wear jewelry, make-up or nail polish.  Do not wear lotions, powders, or perfumes, or deoderant.  Do not shave 48 hours prior to surgery.  Men may shave face and neck.  Do not bring valuables to the hospital.  [facility] is not responsible for any belongings or valuables.  Contacts, dentures or bridgework may not be worn into surgery.  Leave your suitcase in the car.  After surgery it may be brought to your room.  For patients admitted to the hospital, discharge time will be determined by your treatment team.  Patients discharged the day of surgery will not be allowed to drive home.   Name and phone number of your driver:   family Special instructions:  None  Please read over the following fact sheets that you were given. Anesthesia Post-op Instructions and Care and Recovery After Surgery      Toe Amputation Toe amputation is a surgical procedure to remove all or part of a toe. You may have this procedure if:  Tissue in your toe is dying because of poor blood supply.  You have a severe infection in your toe.  Removing your toe keeps nearby tissue healthy. If the toe is infected, removing it helps to keep the infection from spreading. Tell a health care provider about:  Any allergies you have.  All medicines you are taking, including vitamins, herbs, eye drops, creams, and over-the-counter medicines.  Any problems you or family members have had with anesthetic medicines.  Any blood disorders you have.  Any surgeries you have had.  Any medical conditions you  have.  Whether you are pregnant or may be pregnant. What are the risks? Generally, this is a safe procedure. However, problems may occur, including:  Bleeding.  Buildup of blood and fluid (hematoma).  Infection.  Allergic reactions to medicines.  Tissue death in the flap of skin (flap necrosis).  Trouble with healing.  Minor changes in the way that you walk (your gait).  Feeling pain in the area that was removed (phantom pain). This is rare.  What happens before the procedure?  Follow instructions from your health care provider about eating or drinking restrictions.  Ask your health care provider about: ? Changing or stopping your regular medicines. This is especially important if you are taking diabetes medicines or blood thinners. ? Taking medicines such as aspirin and ibuprofen. These medicines can thin your blood. Do not take these medicines before your procedure if your health care provider instructs you not to.  You may be given antibiotic medicine to help prevent infection.  Plan to have someone take you home after the procedure.  If you will be going home right after the procedure, plan to have someone with you for 24 hours. What happens during the procedure?  You will be given one or more of the following: ? A medicine to help you relax (sedative). ? A medicine to numb the area (local anesthetic). ? A medicine to make  you fall asleep (general anesthetic). ? A medicine that is injected into your spine to numb the area below and slightly above the injection site (spinal anesthetic). ? A medicine that is injected into an area of your body to numb everything below the injection site (regional anesthetic).  To reduce your risk of infection: ? Your health care team will wash or sanitize their hands. ? Your skin will be washed with soap.  Your surgeon will mark the area of your toe for removal.  Your surgeon will make a surgical cut (incision) in your toe.  The  dead tissue and bone will be removed.  Nerves and vessels will be tied or heated with a special tool to stop bleeding.  The area will be drained and cleaned.  If only part of a toe is removed, the remaining part will be covered with a flap of skin.  The incision will be treated in one of these ways: ? It will be closed with stitches (sutures). ? It will be left open to heal if there is an infection.  The incision area may be packed with gauze and covered with bandages (dressings).  Tissue samples may be sent to a lab to be examined under a microscope. The procedure may vary among health care providers and hospitals. What happens after the procedure?  Your blood pressure, heart rate, breathing rate, and blood oxygen level will be monitored often until the medicines you were given have worn off.  Your foot will be raised up high (elevated) to relieve swelling.  You will be monitored for pain.  You will be given pain medicines and antibiotics.  Your health care provider or physical therapist will help you to move around as soon as possible.  Do not drive for 24 hours if you received a sedative. This information is not intended to replace advice given to you by your health care provider. Make sure you discuss any questions you have with your health care provider. Document Released: 08/30/2015 Document Revised: 05/22/2016 Document Reviewed: 06/12/2015 Elsevier Interactive Patient Education  2018 [place].  Toe Amputation, Care After Refer to this sheet in the next few weeks. These instructions provide you with information on caring for yourself after your procedure. Your health care provider may also give you more specific instructions. Your treatment has been planned according to current medical practices, but problems sometimes occur. Call your health care provider if you have any problems or questions after your procedure. What can I expect after the procedure? After your  procedure, it is common to have some pain. Pain usually improves within a week. Follow these instructions at home: Medicines  Take your antibiotic medicine as told by your health care provider. Do not stop taking the antibiotic even if you start to feel better.  Take over-the-counter and prescription medicines only as told by your health care provider. Managing pain, stiffness, and swelling   If directed, apply ice to the surgical area: ? Put ice in a plastic bag. ? Place a towel between your skin and the bag. ? Leave the ice on for 20 minutes, 2-3 times a day.  Raise (elevate) your foot so it is above the level of your heart. This helps to reduce swelling.  Try to walk each day. Incision care   Follow instructions from your health care provider about how to take care of your cut from surgery (incision). Make sure you: ? Wash your hands with soap and water before you change your bandage (  dressing). If soap and water are not available, use hand sanitizer. ? Change your dressing as told by your health care provider. ? If you got stitches (sutures), leave them in place. They may need to stay in place for 2 weeks or longer.  Check your incision area every day for signs of infection. Check for: ? More redness, swelling, or pain. ? More fluid or blood. ? Warmth. ? Pus or a bad smell. Bathing  Do not take baths, swim, use a hot tub, or soak your foot until your health care provider approves.  You may shower unless you were told not to. When you shower, keep your dressing dry.  If your dressing has been removed, you may wash your skin with warm water and soap. Driving  Do not drive for 24 hours if you received a sedative.  Do not drive or operate heavy machinery while taking prescription pain medicine. Activity  Do exercises as told by your health care provider.  Return to your normal activities as told by your health care provider. Ask your health care provider what activities  are safe for you. General instructions  Do not use any tobacco products, such as cigarettes, chewing tobacco, and e-cigarettes. If you need help quitting, ask your health care provider.  Ask your health care provider about wearing special shoes or using inserts to support your foot.  If you have diabetes, keep your blood sugar under control.  Keep all follow-up visits as told by your health care provider. This is important. Contact a health care provider if:  You have more redness around your incision.  You have more fluid or blood coming from your incision.  Your incision feels warm to the touch.  You have pus or a bad smell coming from your incision.  You have a fever.  Your dressing is soaked with blood.  Your sutures tear or they separate.  You have numbness or tingling in your toes or foot.  Your foot is cool or pale, or it changes color.  Your pain does not improve after you take your medicine. Get help right away if:  You have pain or swelling that gets worse or does not go away.  You have red streaks on your skin near your toes, foot, or leg.  You have pain in your calf or behind your knee.  You have shortness of breath.  You have chest pain. This information is not intended to replace advice given to you by your health care provider. Make sure you discuss any questions you have with your health care provider. Document Released: 08/30/2015 Document Revised: 05/22/2016 Document Reviewed: 06/12/2015 Elsevier Interactive Patient Education  2018 [place]. Phantom Limb Pain Phantom limb pain occurs in an arm or leg following an amputation. It is pain in an extremity that no longer exists. This pain varies with different patients. Different activities may cause the pain. Some people with an amputated limb experience the opposite of phantom pain, which is phantom pleasure. The trouble may start in a part of the brain known as the sensory cortex. The sensory cortex  is the portion of your brain that processes sensations from the rest of your body. It is hypothesized that when a body part is lost, the corresponding part of the brain is not able to handle the loss and rewires its circuitry to make up for the signals it no longer receives from the missing extremity. The exact mechanism of how phantom limb pain occurs is not known.  The severity of pain seems to be correlated with personal problems such as stress and attitude. It also seems to correlate with the amount of pain a person had before the operation. What are the causes?  Damaged nerve endings.  Scar tissue at the amputation site. How is this treated? Phantom limb pain can be severe and debilitating. Most cases of phantom limb pain only last briefly. There are a number of different therapies and medications that may give relief. Keep working with your health care provider until relief is obtained. Some treatments that may be helpful include:  Hypnosis and mental imagery. Their techniques can give patients the needed impetus to recognize their ability to regain control.  Biofeedback.  Relaxation techniques. They are related to hypnosis techniques and use the mind and body to control pain.  Acupuncture.  Massage.  Exercise.  Antidepressant medicine.  Anticonvulsant medicine.  Narcotics or pain medicines.  Contact a health care provider if: Pain is not relieved or increases. This information is not intended to replace advice given to you by your health care provider. Make sure you discuss any questions you have with your health care provider. Document Released: 12/09/2002 Document Revised: 05/30/2016 Document Reviewed: 02/18/2013 Elsevier Interactive Patient Education  2017 [place] With an Amputation The most common causes of amputation from the hip down include:  Diseases that: ? Reduce the blood flow to an area of your body. ? Decrease your body's ability to fight  infection.  Traumatic injuries that cause significant damage to body tissues.  Birth defects.  Cancerous lumps (malignant tumors). Amputation above the hip is usually the result of trauma or birth defect. Disease is a less common cause. Living with an amputation can be challenging, but you can still live a long, productive life. WHAT ARE THE COMMON CHALLENGES OF LIVING WITH AN AMPUTATION? The most common challenges are mobility and self-care. With some new habits, though, it is often possible to do all of the activities you used to do. You may be able to use a device that substitutes for your limb (prosthesis). The prosthesis helps you adapt more quickly to these challenges. Your health care provider can help select a prosthesis to meet your needs. A person who helps you choose and fits you with a prosthesis (prosthetist) may also help. A rehabilitation program can also help you gain mobility and self-reliance. Your rehabilitation team may include:  Physicians.  Physical and occupational therapists.  Prosthetists.  Nurses.  Social workers.  Psychologists.  Dietitians. Your rehabilitation team will help you with all aspects of recovery and returning to work, home, sports, and your community. You will learn to:  Get around safely.  Adjust your home.  Exercise.  Use a prosthetic.  Work through Financial risk analyst.  Connect with other people who have gone through the same experience. Additional challenges may include:  Grieving period.  Body image issues.  Lifestyle issues, such as sex.  Maintaining a healthy weight. These issues are normal. Discuss these with your rehabilitation team. WHEN CAN I RETURN TO MY REGULAR ACTIVITIES?  Returning to your normal activities is part of healing. Changes can often be made to equipment that allow you to return to a sport or hobby. Some Teacher, English as a foreign language for this. Discuss all of your leisure interests with your  health care provider and prosthetist. WHEN CAN I RETURN TO WORK? When you are ready to return to work, your therapists can perform job site evaluations and make recommendations to help you  perform your job. You may not be able to return to your same job. Your local Office of Vocational Rehabilitation can assist you in job retraining. FOR MORE INFORMATION: Visit these online resources. You can find tips on everything from getting dressed and using bathrooms to driving and travel considerations. These resources can also connect you to a network of emotional support, activities, and innovations. You may also search the Internet to find a local support group.  Amputee Coalition: http://www.amputee-coalition.org/ensuring-fall-safety/  Amputee Support Groups: http://amputee.supportgroups.com  Daily Strength: ItCheaper.dk  [employer]: http://www.nationalamputation.[facility] on Health, Physical Activity and Disability: http://www.nchpad.org  [employer] [place]: http://www.disabledsportsusa.org  American Academy of Orthotists and Prosthetists: http://www.oandp.org This information is not intended to replace advice given to you by your health care provider. Make sure you discuss any questions you have with your health care provider. Document Released: 06/10/2002 Document Revised: 10/09/2014 Document Reviewed: 02/03/2014 Elsevier Interactive Patient Education  2017 [place] Anesthesia, Adult General anesthesia is the use of medicines to make a person "go to sleep" (be unconscious) for a medical procedure. General anesthesia is often recommended when a procedure:  Is long.  Requires you to be still or in an unusual position.  Is major and can cause you to lose blood.  Is impossible to do without general anesthesia.  The medicines used for general anesthesia are called general anesthetics. In addition to making  you sleep, the medicines:  Prevent pain.  Control your blood pressure.  Relax your muscles.  Tell a health care provider about:  Any allergies you have.  All medicines you are taking, including vitamins, herbs, eye drops, creams, and over-the-counter medicines.  Any problems you or family members have had with anesthetic medicines.  Types of anesthetics you have had in the past.  Any bleeding disorders you have.  Any surgeries you have had.  Any medical conditions you have.  Any history of heart or lung conditions, such as heart failure, sleep apnea, or chronic obstructive pulmonary disease (COPD).  Whether you are pregnant or may be pregnant.  Whether you use tobacco, alcohol, marijuana, or street drugs.  Any history of Armed forces logistics/support/administrative officer.  Any history of depression or anxiety. What are the risks? Generally, this is a safe procedure. However, problems may occur, including:  Allergic reaction to anesthetics.  Lung and heart problems.  Inhaling food or liquids from your stomach into your lungs (aspiration).  Injury to nerves.  Waking up during your procedure and being unable to move (rare).  Extreme agitation or a state of mental confusion (delirium) when you wake up from the anesthetic.  Air in the bloodstream, which can lead to stroke.  These problems are more likely to develop if you are having a major surgery or if you have an advanced medical condition. You can prevent some of these complications by answering all of your health care provider's questions thoroughly and by following all pre-procedure instructions. General anesthesia can cause side effects, including:  Nausea or vomiting  A sore throat from the breathing tube.  Feeling cold or shivery.  Feeling tired, washed out, or achy.  Sleepiness or drowsiness.  Confusion or agitation.  What happens before the procedure? Staying hydrated Follow instructions from your health care provider about  hydration, which may include:  Up to 2 hours before the procedure - you may continue to drink clear liquids, such as water, clear fruit juice, black coffee, and plain tea.  Eating and drinking restrictions Follow instructions from  your health care provider about eating and drinking, which may include:  8 hours before the procedure - stop eating heavy meals or foods such as meat, fried foods, or fatty foods.  6 hours before the procedure - stop eating light meals or foods, such as toast or cereal.  6 hours before the procedure - stop drinking milk or drinks that contain milk.  2 hours before the procedure - stop drinking clear liquids.  Medicines  Ask your health care provider about: ? Changing or stopping your regular medicines. This is especially important if you are taking diabetes medicines or blood thinners. ? Taking medicines such as aspirin and ibuprofen. These medicines can thin your blood. Do not take these medicines before your procedure if your health care provider instructs you not to. ? Taking new dietary supplements or medicines. Do not take these during the week before your procedure unless your health care provider approves them.  If you are told to take a medicine or to continue taking a medicine on the day of the procedure, take the medicine with sips of water. General instructions   Ask if you will be going home the same day, the following day, or after a longer hospital stay. ? Plan to have someone take you home. ? Plan to have someone stay with you for the first 24 hours after you leave the hospital or clinic.  For 3-6 weeks before the procedure, try not to use any tobacco products, such as cigarettes, chewing tobacco, and e-cigarettes.  You may brush your teeth on the morning of the procedure, but make sure to spit out the toothpaste. What happens during the procedure?  You will be given anesthetics through a mask and through an IV tube in one of your  veins.  You may receive medicine to help you relax (sedative).  As soon as you are asleep, a breathing tube may be used to help you breathe.  An anesthesia specialist will stay with you throughout the procedure. He or she will help keep you comfortable and safe by continuing to give you medicines and adjusting the amount of medicine that you get. He or she will also watch your blood pressure, pulse, and oxygen levels to make sure that the anesthetics do not cause any problems.  If a breathing tube was used to help you breathe, it will be removed before you wake up. The procedure may vary among health care providers and hospitals. What happens after the procedure?  You will wake up, often slowly, after the procedure is complete, usually in a recovery area.  Your blood pressure, heart rate, breathing rate, and blood oxygen level will be monitored until the medicines you were given have worn off.  You may be given medicine to help you calm down if you feel anxious or agitated.  If you will be going home the same day, your health care provider may check to make sure you can stand, drink, and urinate.  Your health care providers will treat your pain and side effects before you go home.  Do not drive for 24 hours if you received a sedative.  You may: ? Feel nauseous and vomit. ? Have a sore throat. ? Have mental slowness. ? Feel cold or shivery. ? Feel sleepy. ? Feel tired. ? Feel sore or achy, even in parts of your body where you did not have surgery. This information is not intended to replace advice given to you by your health care provider. Make sure  you discuss any questions you have with your health care provider. Document Released: 12/26/2007 Document Revised: 02/29/2016 Document Reviewed: 09/02/2015 Elsevier Interactive Patient Education  2018 [place] Anesthesia, Adult, Care After These instructions provide you with information about caring for yourself after your  procedure. Your health care provider may also give you more specific instructions. Your treatment has been planned according to current medical practices, but problems sometimes occur. Call your health care provider if you have any problems or questions after your procedure. What can I expect after the procedure? After the procedure, it is common to have:  Vomiting.  A sore throat.  Mental slowness.  It is common to feel:  Nauseous.  Cold or shivery.  Sleepy.  Tired.  Sore or achy, even in parts of your body where you did not have surgery.  Follow these instructions at home: For at least 24 hours after the procedure:  Do not: ? Participate in activities where you could fall or become injured. ? Drive. ? Use heavy machinery. ? Drink alcohol. ? Take sleeping pills or medicines that cause drowsiness. ? Make important decisions or sign legal documents. ? Take care of children on your own.  Rest. Eating and drinking  If you vomit, drink water, juice, or soup when you can drink without vomiting.  Drink enough fluid to keep your urine clear or pale yellow.  Make sure you have little or no nausea before eating solid foods.  Follow the diet recommended by your health care provider. General instructions  Have a responsible adult stay with you until you are awake and alert.  Return to your normal activities as told by your health care provider. Ask your health care provider what activities are safe for you.  Take over-the-counter and prescription medicines only as told by your health care provider.  If you smoke, do not smoke without supervision.  Keep all follow-up visits as told by your health care provider. This is important. Contact a health care provider if:  You continue to have nausea or vomiting at home, and medicines are not helpful.  You cannot drink fluids or start eating again.  You cannot urinate after 8-12 hours.  You develop a skin rash.  You have  fever.  You have increasing redness at the site of your procedure. Get help right away if:  You have difficulty breathing.  You have chest pain.  You have unexpected bleeding.  You feel that you are having a life-threatening or urgent problem. This information is not intended to replace advice given to you by your health care provider. Make sure you discuss any questions you have with your health care provider. Document Released: 12/25/2000 Document Revised: 02/21/2016 Document Reviewed: 09/02/2015 Elsevier Interactive Patient Education  [employer].

## 2017-05-08 ENCOUNTER — Ambulatory Visit (HOSPITAL_COMMUNITY)
Admission: RE | Admit: 2017-05-08 | Discharge: 2017-05-08 | Disposition: A | Discharge status: Home / Self Care | Payer: BLUE CROSS/BLUE SHIELD | Source: Ambulatory Visit | Attending: Podiatry | Admitting: Podiatry

## 2017-05-08 ENCOUNTER — Encounter (HOSPITAL_COMMUNITY)
Admission: RE | Admit: 2017-05-08 | Discharge: 2017-05-08 | Disposition: A | Discharge status: Home / Self Care | Payer: BLUE CROSS/BLUE SHIELD | Source: Ambulatory Visit | Attending: Podiatry | Admitting: Podiatry

## 2017-05-08 ENCOUNTER — Encounter (HOSPITAL_COMMUNITY): Payer: Self-pay

## 2017-05-08 ENCOUNTER — Other Ambulatory Visit (HOSPITAL_COMMUNITY): Payer: Self-pay | Admitting: Podiatry

## 2017-05-08 ENCOUNTER — Other Ambulatory Visit: Payer: Self-pay | Admitting: Podiatry

## 2017-05-08 ENCOUNTER — Other Ambulatory Visit: Payer: Self-pay

## 2017-05-08 DIAGNOSIS — M2012 Hallux valgus (acquired), left foot: Secondary | ICD-10-CM | POA: Diagnosis not present

## 2017-05-08 DIAGNOSIS — R52 Pain, unspecified: Secondary | ICD-10-CM | POA: Diagnosis not present

## 2017-05-08 DIAGNOSIS — M7732 Calcaneal spur, left foot: Secondary | ICD-10-CM | POA: Insufficient documentation

## 2017-05-08 LAB — CBC WITH DIFFERENTIAL/PLATELET
Basophils Absolute: 0 10*3/uL (ref 0.0–0.1)
Basophils Relative: 0 %
EOS ABS: 0.1 10*3/uL (ref 0.0–0.7)
EOS PCT: 1 %
HCT: 29.7 % — ABNORMAL LOW (ref 36.0–46.0)
Hemoglobin: 8.8 g/dL — ABNORMAL LOW (ref 12.0–15.0)
LYMPHS ABS: 2.1 10*3/uL (ref 0.7–4.0)
LYMPHS PCT: 13 %
MCH: 22.6 pg — AB (ref 26.0–34.0)
MCHC: 29.6 g/dL — AB (ref 30.0–36.0)
MCV: 76.3 fL — AB (ref 78.0–100.0)
MONO ABS: 1.7 10*3/uL — AB (ref 0.1–1.0)
Monocytes Relative: 11 %
Neutro Abs: 12.2 10*3/uL — ABNORMAL HIGH (ref 1.7–7.7)
Neutrophils Relative %: 75 %
PLATELETS: 779 10*3/uL — AB (ref 150–400)
RBC: 3.89 MIL/uL (ref 3.87–5.11)
RDW: 16.8 % — AB (ref 11.5–15.5)
WBC: 16.1 10*3/uL — ABNORMAL HIGH (ref 4.0–10.5)

## 2017-05-08 LAB — SURGICAL PCR SCREEN
MRSA, PCR: NEGATIVE
Staphylococcus aureus: NEGATIVE

## 2017-05-08 LAB — BASIC METABOLIC PANEL
Anion gap: 10 (ref 5–15)
BUN: 11 mg/dL (ref 6–20)
CALCIUM: 9.5 mg/dL (ref 8.9–10.3)
CO2: 22 mmol/L (ref 22–32)
CREATININE: 1.04 mg/dL — AB (ref 0.44–1.00)
Chloride: 107 mmol/L (ref 101–111)
GFR calc Af Amer: >60 mL/min (ref >60)
GFR, EST NON AFRICAN AMERICAN: 58 mL/min — AB (ref >60)
GLUCOSE: 148 mg/dL — AB (ref 65–99)
Potassium: 4.3 mmol/L (ref 3.5–5.1)
SODIUM: 139 mmol/L (ref 135–145)

## 2017-05-09 ENCOUNTER — Ambulatory Visit (HOSPITAL_COMMUNITY): Payer: BLUE CROSS/BLUE SHIELD | Admitting: Anesthesiology

## 2017-05-09 ENCOUNTER — Ambulatory Visit (HOSPITAL_COMMUNITY)
Admission: RE | Admit: 2017-05-09 | Discharge: 2017-05-09 | Disposition: A | Discharge status: Home / Self Care | Payer: BLUE CROSS/BLUE SHIELD | Source: Ambulatory Visit | Attending: Podiatry | Admitting: Podiatry

## 2017-05-09 ENCOUNTER — Encounter (HOSPITAL_COMMUNITY)
Admission: RE | Disposition: A | Discharge status: Home / Self Care | Payer: Self-pay | Source: Ambulatory Visit | Attending: Podiatry

## 2017-05-09 ENCOUNTER — Ambulatory Visit (HOSPITAL_COMMUNITY): Payer: BLUE CROSS/BLUE SHIELD

## 2017-05-09 ENCOUNTER — Encounter (HOSPITAL_COMMUNITY): Payer: Self-pay

## 2017-05-09 DIAGNOSIS — J449 Chronic obstructive pulmonary disease, unspecified: Secondary | ICD-10-CM | POA: Insufficient documentation

## 2017-05-09 DIAGNOSIS — M2012 Hallux valgus (acquired), left foot: Secondary | ICD-10-CM | POA: Insufficient documentation

## 2017-05-09 DIAGNOSIS — I2510 Atherosclerotic heart disease of native coronary artery without angina pectoris: Secondary | ICD-10-CM | POA: Diagnosis not present

## 2017-05-09 DIAGNOSIS — L97529 Non-pressure chronic ulcer of other part of left foot with unspecified severity: Secondary | ICD-10-CM | POA: Insufficient documentation

## 2017-05-09 DIAGNOSIS — F17210 Nicotine dependence, cigarettes, uncomplicated: Secondary | ICD-10-CM | POA: Diagnosis not present

## 2017-05-09 DIAGNOSIS — Z79899 Other long term (current) drug therapy: Secondary | ICD-10-CM | POA: Diagnosis not present

## 2017-05-09 DIAGNOSIS — Z791 Long term (current) use of non-steroidal anti-inflammatories (NSAID): Secondary | ICD-10-CM | POA: Diagnosis not present

## 2017-05-09 DIAGNOSIS — K219 Gastro-esophageal reflux disease without esophagitis: Secondary | ICD-10-CM | POA: Diagnosis not present

## 2017-05-09 DIAGNOSIS — I96 Gangrene, not elsewhere classified: Secondary | ICD-10-CM | POA: Insufficient documentation

## 2017-05-09 DIAGNOSIS — I509 Heart failure, unspecified: Secondary | ICD-10-CM | POA: Diagnosis not present

## 2017-05-09 DIAGNOSIS — Z9889 Other specified postprocedural states: Secondary | ICD-10-CM

## 2017-05-09 DIAGNOSIS — M2042 Other hammer toe(s) (acquired), left foot: Secondary | ICD-10-CM | POA: Insufficient documentation

## 2017-05-09 DIAGNOSIS — I110 Hypertensive heart disease with heart failure: Secondary | ICD-10-CM | POA: Diagnosis not present

## 2017-05-09 DIAGNOSIS — M879 Osteonecrosis, unspecified: Secondary | ICD-10-CM | POA: Insufficient documentation

## 2017-05-09 HISTORY — PX: AMPUTATION: SHX166

## 2017-05-09 SURGERY — AMPUTATION DIGIT
Anesthesia: Monitor Anesthesia Care | Laterality: Left

## 2017-05-09 MED ORDER — LIDOCAINE HCL (PF) 1 % IJ SOLN
INTRAMUSCULAR | Status: DC | PRN
  Administered 2017-05-09: 40 mg

## 2017-05-09 MED ORDER — PHENYLEPHRINE 40 MCG/ML (10ML) SYRINGE FOR IV PUSH (FOR BLOOD PRESSURE SUPPORT)
PREFILLED_SYRINGE | INTRAVENOUS | Status: AC
  Filled 2017-05-09: qty 10

## 2017-05-09 MED ORDER — PROPOFOL 10 MG/ML IV BOLUS
INTRAVENOUS | Status: DC | PRN
  Administered 2017-05-09: 20 mg via INTRAVENOUS

## 2017-05-09 MED ORDER — FENTANYL CITRATE (PF) 100 MCG/2ML IJ SOLN: 25.0000 ug | INTRAMUSCULAR | Status: DC | PRN

## 2017-05-09 MED ORDER — LIDOCAINE HCL (PF) 1 % IJ SOLN
INTRAMUSCULAR | Status: AC
  Filled 2017-05-09: qty 5

## 2017-05-09 MED ORDER — FENTANYL CITRATE (PF) 100 MCG/2ML IJ SOLN
INTRAMUSCULAR | Status: AC
  Filled 2017-05-09: qty 2

## 2017-05-09 MED ORDER — CLINDAMYCIN PHOSPHATE 600 MG/50ML IV SOLN
600.0000 mg | Freq: Once | INTRAVENOUS | Status: AC
  Administered 2017-05-09: 600 mg via INTRAVENOUS

## 2017-05-09 MED ORDER — CLINDAMYCIN PHOSPHATE 600 MG/50ML IV SOLN
INTRAVENOUS | Status: AC
  Filled 2017-05-09: qty 50

## 2017-05-09 MED ORDER — BUPIVACAINE HCL (PF) 0.5 % IJ SOLN
INTRAMUSCULAR | Status: DC | PRN
  Administered 2017-05-09: 16 mL

## 2017-05-09 MED ORDER — EPHEDRINE SULFATE 50 MG/ML IJ SOLN
INTRAMUSCULAR | Status: AC
  Filled 2017-05-09: qty 1

## 2017-05-09 MED ORDER — PHENYLEPHRINE HCL 10 MG/ML IJ SOLN
INTRAMUSCULAR | Status: DC | PRN
Start: 2017-05-09 — End: 2017-05-09
  Administered 2017-05-09: 40 ug via INTRAVENOUS
  Administered 2017-05-09 (×4): 80 ug via INTRAVENOUS

## 2017-05-09 MED ORDER — 0.9 % SODIUM CHLORIDE (POUR BTL) OPTIME
TOPICAL | Status: DC | PRN
  Administered 2017-05-09: 1000 mL

## 2017-05-09 MED ORDER — EPHEDRINE SULFATE 50 MG/ML IJ SOLN
INTRAMUSCULAR | Status: DC | PRN
  Administered 2017-05-09 (×2): 5 mg via INTRAVENOUS

## 2017-05-09 MED ORDER — FENTANYL CITRATE (PF) 100 MCG/2ML IJ SOLN
25.0000 ug | Freq: Once | INTRAMUSCULAR | Status: AC
  Administered 2017-05-09: 25 ug via INTRAVENOUS

## 2017-05-09 MED ORDER — LACTATED RINGERS IV SOLN
INTRAVENOUS | Status: DC
  Administered 2017-05-09: 09:00:00 via INTRAVENOUS

## 2017-05-09 MED ORDER — SODIUM CHLORIDE 0.9 % IJ SOLN
INTRAMUSCULAR | Status: AC
  Filled 2017-05-09: qty 10

## 2017-05-09 MED ORDER — BUPIVACAINE HCL (PF) 0.5 % IJ SOLN
INTRAMUSCULAR | Status: AC
  Filled 2017-05-09: qty 30

## 2017-05-09 MED ORDER — MIDAZOLAM HCL 2 MG/2ML IJ SOLN
1.0000 mg | INTRAMUSCULAR | Status: AC
  Administered 2017-05-09: 2 mg via INTRAVENOUS

## 2017-05-09 MED ORDER — PROPOFOL 500 MG/50ML IV EMUL
INTRAVENOUS | Status: DC | PRN
  Administered 2017-05-09: 150 ug/kg/min via INTRAVENOUS

## 2017-05-09 MED ORDER — PROPOFOL 10 MG/ML IV BOLUS
INTRAVENOUS | Status: AC
  Filled 2017-05-09: qty 20

## 2017-05-09 MED ORDER — MIDAZOLAM HCL 2 MG/2ML IJ SOLN
INTRAMUSCULAR | Status: AC
  Filled 2017-05-09: qty 2

## 2017-05-09 SURGICAL SUPPLY — 41 items
BAG HAMPER (MISCELLANEOUS) ×2 IMPLANT
BANDAGE ELASTIC 3 VELCRO ST LF (GAUZE/BANDAGES/DRESSINGS) ×1 IMPLANT
BANDAGE ELASTIC 4 VELCRO NS (GAUZE/BANDAGES/DRESSINGS) ×2 IMPLANT
BANDAGE ESMARK 4X12 BL STRL LF (DISPOSABLE) ×1 IMPLANT
BLADE AVERAGE 25X9 (BLADE) IMPLANT
BLADE SURG 15 STRL LF DISP TIS (BLADE) ×1 IMPLANT
BLADE SURG 15 STRL SS (BLADE) ×2
BNDG CMPR 12X4 ELC STRL LF (DISPOSABLE) ×1
BNDG CONFORM 2 STRL LF (GAUZE/BANDAGES/DRESSINGS) ×2 IMPLANT
BNDG ESMARK 4X12 BLUE STRL LF (DISPOSABLE) ×2
BNDG GAUZE ELAST 4 BULKY (GAUZE/BANDAGES/DRESSINGS) ×2 IMPLANT
CLOTH BEACON ORANGE TIMEOUT ST (SAFETY) ×2 IMPLANT
COVER LIGHT HANDLE STERIS (MISCELLANEOUS) ×4 IMPLANT
CUFF TOURNIQUET SINGLE 18IN (TOURNIQUET CUFF) ×2 IMPLANT
DECANTER SPIKE VIAL GLASS SM (MISCELLANEOUS) ×2 IMPLANT
DRSG ADAPTIC 3X8 NADH LF (GAUZE/BANDAGES/DRESSINGS) ×2 IMPLANT
DRSG EMULSION OIL 3X16 NADH (GAUZE/BANDAGES/DRESSINGS) ×1 IMPLANT
ELECT REM PT RETURN 9FT ADLT (ELECTROSURGICAL) ×2
ELECTRODE REM PT RTRN 9FT ADLT (ELECTROSURGICAL) ×1 IMPLANT
GAUZE KERLIX 2X3 DERM STRL LF (GAUZE/BANDAGES/DRESSINGS) ×1 IMPLANT
GAUZE SPONGE 4X4 12PLY STRL (GAUZE/BANDAGES/DRESSINGS) ×2 IMPLANT
GLOVE BIO SURGEON STRL SZ7.5 (GLOVE) ×2 IMPLANT
GLOVE BIOGEL PI IND STRL 7.0 (GLOVE) ×1 IMPLANT
GLOVE BIOGEL PI INDICATOR 7.0 (GLOVE) ×1
GOWN STRL REUS W/TWL LRG LVL3 (GOWN DISPOSABLE) ×4 IMPLANT
KIT ROOM TURNOVER APOR (KITS) ×2 IMPLANT
MANIFOLD NEPTUNE II (INSTRUMENTS) ×2 IMPLANT
NDL HYPO 27GX1-1/4 (NEEDLE) ×2 IMPLANT
NEEDLE HYPO 27GX1-1/4 (NEEDLE) ×4 IMPLANT
NS IRRIG 1000ML POUR BTL (IV SOLUTION) ×2 IMPLANT
PACK BASIC LIMB (CUSTOM PROCEDURE TRAY) ×2 IMPLANT
PAD ARMBOARD 7.5X6 YLW CONV (MISCELLANEOUS) ×2 IMPLANT
RASP SM TEAR CROSS CUT (RASP) IMPLANT
SET BASIN LINEN APH (SET/KITS/TRAYS/PACK) ×2 IMPLANT
SOL PREP PROV IODINE SCRUB 4OZ (MISCELLANEOUS) ×2 IMPLANT
SPONGE GAUZE 4X4 12PLY STER LF (GAUZE/BANDAGES/DRESSINGS) ×1 IMPLANT
SPONGE LAP 18X18 X RAY DECT (DISPOSABLE) ×2 IMPLANT
SUT ETHILON 4 0 PS 2 18 (SUTURE) IMPLANT
SUT PROLENE 4 0 PS 2 18 (SUTURE) ×2 IMPLANT
SUT VIC AB 4-0 PS2 27 (SUTURE) IMPLANT
SYR CONTROL 10ML LL (SYRINGE) ×4 IMPLANT

## 2017-05-09 NOTE — Brief Op Note (Signed)
BRIEF OPERATIVE NOTE  DATE OF PROCEDURE 05/09/2017  SURGEON Marcheta Grammes, DPM  ASSISTANT SURGEON Jilda Panda, DPM  OR STAFF Circulator: Jayme Cloud, RN Scrub Person: Karin Lieu, CST   PREOPERATIVE DIAGNOSIS 1.  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  Peripheral vascular disease  POSTOPERATIVE DIAGNOSIS Same  PROCEDURE Amputation of the 5th toe, left foot  ANESTHESIA Monitor Anesthesia Care   HEMOSTASIS Pneumatic ankle tourniquet applied but not inflated  ESTIMATED BLOOD LOSS Minimal (<5 cc)  MATERIALS USED None  INJECTABLES 0.5% Marcaine plain  PATHOLOGY 5th toe, left foot  COMPLICATIONS None

## 2017-05-09 NOTE — Anesthesia Preprocedure Evaluation (Signed)
Anesthesia Evaluation  Patient identified by MRN, date of birth, ID band Patient awake    Reviewed: Allergy & Precautions, H&P , NPO status , Patient's Chart, lab work & pertinent test results, reviewed documented beta blocker date and time   Airway Mallampati: II  TM Distance: >3 FB     Dental  (+) Edentulous Upper, Edentulous Lower   Pulmonary COPD, Current Smoker,    breath sounds clear to auscultation       Cardiovascular hypertension, Pt. on medications and Pt. on home beta blockers + CAD, + Peripheral Vascular Disease, +CHF and + Orthopnea  + dysrhythmias Supra Ventricular Tachycardia  Rhythm:Regular Rate:Normal     Neuro/Psych    GI/Hepatic negative GI ROS, PUD, GERD  Medicated,  Endo/Other    Renal/GU      Musculoskeletal   Abdominal   Peds  Hematology  (+) Blood dyscrasia, anemia ,   Anesthesia Other Findings   Reproductive/Obstetrics                             Anesthesia Physical Anesthesia Plan  ASA: III  Anesthesia Plan: MAC   Post-op Pain Management:    Induction: Intravenous  PONV Risk Score and Plan:   Airway Management Planned: Simple Face Mask  Additional Equipment:   Intra-op Plan:   Post-operative Plan:   Informed Consent: I have reviewed the patients History and Physical, chart, labs and discussed the procedure including the risks, benefits and alternatives for the proposed anesthesia with the patient or authorized representative who has indicated his/her understanding and acceptance.     Plan Discussed with:   Anesthesia Plan Comments:         Anesthesia Quick Evaluation

## 2017-05-09 NOTE — Discharge Instructions (Signed)
These instructions will give you an idea of what to expect after surgery and how to manage issues that may arise before your first post op office visit. ° °Pain Management °Pain is best managed by “staying ahead” of it. If pain gets out of control, it is difficult to get it back under control. Local anesthesia that lasts 6-8 hours is used to numb the foot and decrease pain.  For the best pain control, take the pain medication every 4 hours for the first 2 days post op. On the third day pain medication can be taken as needed.  ° °Post Op Nausea °Nausea is common after surgery, so it is managed proactively.  °If prescribed, use the prescribed nausea medication regularly for the first 2 days post op. ° °Bandages °Do not worry if there is blood on the bandage. What looks like a lot of blood on the bandage is actually a small amount. Blood on the dressing spreads out as it is absorbed by the gauze, the same way a drop of water spreads out on a paper towel.  °If the bandages feel wet or dry, stiff and uncomfortable, call the office during office hours and we will schedule a time for you to have the bandage changed.  °Unless you are specifically told otherwise, we will do the first bandage change in the office.  °Keep your bandage dry. If the bandage becomes wet or soiled, notify the office and we will schedule a time to change the bandage. ° °Activity °It is best to spend most of the first 2 days after surgery lying down with the foot elevated above the level of your heart. °You may put weight on your heel while wearing the surgical shoe.   °You may only get up to go to the restroom. ° °Driving °Do not drive until you are able to respond in an emergency (i.e. slam on the brakes). This usually occurs after the bone has healed - 6 to 8 weeks. ° °Call the Office °If you have a fever over 101°F.  °If you have increasing pain after the initial post op pain has settled down.  °If you have increasing redness, swelling, or  drainage.  °If you have any questions or concerns.  ° ° °PATIENT INSTRUCTIONS °POST-ANESTHESIA ° °IMMEDIATELY FOLLOWING SURGERY:  Do not drive or operate machinery for the first twenty four hours after surgery.  Do not make any important decisions for twenty four hours after surgery or while taking narcotic pain medications or sedatives.  If you develop intractable nausea and vomiting or a severe headache please notify your doctor immediately. ° °FOLLOW-UP:  Please make an appointment with your surgeon as instructed. You do not need to follow up with anesthesia unless specifically instructed to do so. ° °WOUND CARE INSTRUCTIONS (if applicable):  Keep a dry clean dressing on the anesthesia/puncture wound site if there is drainage.  Once the wound has quit draining you may leave it open to air.  Generally you should leave the bandage intact for twenty four hours unless there is drainage.  If the epidural site drains for more than 36-48 hours please call the anesthesia department. ° °QUESTIONS?:  Please feel free to call your physician or the hospital operator if you have any questions, and they will be happy to assist you.    ° ° ° °

## 2017-05-09 NOTE — Op Note (Signed)
OPERATIVE NOTE  DATE OF PROCEDURE 05/09/2017  SURGEON Marcheta Grammes, DPM  ASSISTANT SURGEON Jilda Panda, DPM  OR STAFF Circulator: Jayme Cloud, RN Scrub Person: Karin Lieu, CST   PREOPERATIVE DIAGNOSIS 1.  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  Peripheral vascular disease  POSTOPERATIVE DIAGNOSIS Same  PROCEDURE Amputation of the 5th toe, left foot  ANESTHESIA Monitor Anesthesia Care   HEMOSTASIS Pneumatic ankle tourniquet applied but not inflated  ESTIMATED BLOOD LOSS Minimal (<5 cc)  MATERIALS USED None  INJECTABLES 0.5% Marcaine plain  PATHOLOGY 5th toe, left foot  COMPLICATIONS None   INDICATIONS:   Gangrenous changes of the left fifth toe.  An interventional vascular procedure has been performed to address SFA stenosis.  DESCRIPTION OF THE PROCEDURE:  The patient was brought to the operating room and placed on the operative table in the supine position.  A pneumatic ankle tourniquet was placed about the patient's left ankle.  The tourniquet was not inflated during any portion of the procedure.  The left foot was anesthetized with 0.5% Marcaine plain.  The left foot was scrubbed, prepped and draped in the usual sterile manner.  Attention was directed to the left fifth toe.  2 converging semielliptical incisions were made encompassing the fifth toe circumferentially.  Dissection was continued deep to the level of the fifth metatarsal phalangeal joint.  The joint was disarticulated.  The toe was passed from the operative field and sent to pathology for evaluation.  Adequate bleeding of the skin edges was noted.  The surgical wound was irrigated with copious amounts of sterile irrigant.  The skin was reapproximated using 4-0 Prolene in a simple suture technique.  A sterile dressing was applied to the left foot.    The patient tolerated the procedure and anesthesia well.  She was transferred from the operating room to the postanesthesia anesthesia care  unit with vital signs stable.

## 2017-05-09 NOTE — Anesthesia Postprocedure Evaluation (Signed)
Anesthesia Post Note  Patient: Tammy Mcpherson  Procedure(s) Performed: Procedure(s) (LRB): AMPUTATION 5TH TOE LEFT FOOT (Left)  Patient location during evaluation: PACU Anesthesia Type: MAC Level of consciousness: awake, awake and alert, oriented and patient cooperative Pain management: pain level controlled Vital Signs Assessment: post-procedure vital signs reviewed and stable Respiratory status: spontaneous breathing, nonlabored ventilation, respiratory function stable and patient connected to face mask oxygen Cardiovascular status: stable Postop Assessment: no signs of nausea or vomiting Anesthetic complications: no     Last Vitals:  Vitals:   05/09/17 0855 05/09/17 0900  BP: (!) 146/58 139/65  Pulse:    Resp: 18 20  Temp:      Last Pain:  Vitals:   05/09/17 0827  TempSrc: Oral                 [name]

## 2017-05-09 NOTE — H&P (Signed)
HISTORY AND PHYSICAL INTERVAL NOTE:  05/09/2017  8:57 AM  Tammy Mcpherson  has presented today for surgery, with the diagnosis of gangrene 5th toe left foot.  The various methods of treatment have been discussed with the patient.  No guarantees were given.  After consideration of risks, benefits and other options for treatment, the patient has consented to surgery.  I have reviewed the patients' chart and labs.    Patient Vitals for the past 24 hrs:  BP Temp Temp src Pulse Resp SpO2 Height Weight  05/09/17 0832 - - - - - - 5\' 5"  (1.651 m) 147 lb (66.7 kg)  05/09/17 0827 129/60 98 F (36.7 C) Oral 75 16 98 % - -    A history and physical examination was performed in my office.  The patient was reexamined.  There have been no changes to this history and physical examination.  Marcheta Grammes, DPM

## 2017-05-09 NOTE — Transfer of Care (Signed)
Immediate Anesthesia Transfer of Care Note  Patient: Tammy Mcpherson  Procedure(s) Performed: Procedure(s): AMPUTATION 5TH TOE LEFT FOOT (Left)  Patient Location: PACU  Anesthesia Type:MAC  Level of Consciousness: awake, alert , oriented and patient cooperative  Airway & Oxygen Therapy: Patient Spontanous Breathing and Patient connected to face mask oxygen  Post-op Assessment: Report given to RN and Post -op Vital signs reviewed and stable  Post vital signs: Reviewed and stable  Last Vitals:  Vitals:   05/09/17 0855 05/09/17 0900  BP: (!) 146/58 139/65  Pulse:    Resp: 18 20  Temp:      Last Pain:  Vitals:   05/09/17 0827  TempSrc: Oral         Complications: No apparent anesthesia complications

## 2017-05-10 ENCOUNTER — Encounter (HOSPITAL_COMMUNITY): Payer: Self-pay | Admitting: Podiatry

## 2017-05-21 ENCOUNTER — Telehealth: Payer: Self-pay

## 2017-05-21 MED ORDER — ALBUTEROL SULFATE HFA 108 (90 BASE) MCG/ACT IN AERS: INHALATION_SPRAY | RESPIRATORY_TRACT | 3 refills | Status: DC

## 2017-05-21 NOTE — Telephone Encounter (Signed)
Seen [phone] 

## 2017-05-28 ENCOUNTER — Ambulatory Visit: Payer: BLUE CROSS/BLUE SHIELD | Admitting: Gastroenterology

## 2017-05-30 ENCOUNTER — Encounter (HOSPITAL_COMMUNITY): Payer: BLUE CROSS/BLUE SHIELD

## 2017-05-30 ENCOUNTER — Encounter: Payer: BLUE CROSS/BLUE SHIELD | Admitting: Vascular Surgery

## 2017-06-06 ENCOUNTER — Other Ambulatory Visit: Payer: Self-pay | Admitting: Podiatry

## 2017-06-11 NOTE — Pre-Procedure Instructions (Signed)
H&H of 8.8 and 29.7 from 05/08/2017 shown to Dr Patsey Berthold. He wants H&H repeated on arrival to pre-op 06/12/2017.

## 2017-06-11 NOTE — Patient Instructions (Signed)
Tammy Mcpherson  06/11/2017     @PREFPERIOPPHARMACY @   Your procedure is scheduled on  06/13/2017   Report to [facility] at  615  A.M.  Call this number if you have problems the morning of surgery:  [phone]   Remember:  Do not eat food or drink liquids after midnight.  Take these medicines the morning of surgery with A SIP OF WATER  Bisoprolol, losartan, protonix. Use your inhaler before you come.   Do not wear jewelry, make-up or nail polish.  Do not wear lotions, powders, or perfumes, or deoderant.  Do not shave 48 hours prior to surgery.  Men may shave face and neck.  Do not bring valuables to the hospital.  [facility] is not responsible for any belongings or valuables.  Contacts, dentures or bridgework may not be worn into surgery.  Leave your suitcase in the car.  After surgery it may be brought to your room.  For patients admitted to the hospital, discharge time will be determined by your treatment team.  Patients discharged the day of surgery will not be allowed to drive home.   Name and phone number of your driver:   family Special instructions:  None  Please read over the following fact sheets that you were given. Anesthesia Post-op Instructions and Care and Recovery After Surgery      Toe Amputation Toe amputation is a surgical procedure to remove all or part of a toe. You may have this procedure if:  Tissue in your toe is dying because of poor blood supply.  You have a severe infection in your toe.  Removing your toe keeps nearby tissue healthy. If the toe is infected, removing it helps to keep the infection from spreading. Tell a health care provider about:  Any allergies you have.  All medicines you are taking, including vitamins, herbs, eye drops, creams, and over-the-counter medicines.  Any problems you or family members have had with anesthetic medicines.  Any blood disorders you have.  Any surgeries you have had.  Any medical  conditions you have.  Whether you are pregnant or may be pregnant. What are the risks? Generally, this is a safe procedure. However, problems may occur, including:  Bleeding.  Buildup of blood and fluid (hematoma).  Infection.  Allergic reactions to medicines.  Tissue death in the flap of skin (flap necrosis).  Trouble with healing.  Minor changes in the way that you walk (your gait).  Feeling pain in the area that was removed (phantom pain). This is rare.  What happens before the procedure?  Follow instructions from your health care provider about eating or drinking restrictions.  Ask your health care provider about: ? Changing or stopping your regular medicines. This is especially important if you are taking diabetes medicines or blood thinners. ? Taking medicines such as aspirin and ibuprofen. These medicines can thin your blood. Do not take these medicines before your procedure if your health care provider instructs you not to.  You may be given antibiotic medicine to help prevent infection.  Plan to have someone take you home after the procedure.  If you will be going home right after the procedure, plan to have someone with you for 24 hours. What happens during the procedure?  You will be given one or more of the following: ? A medicine to help you relax (sedative). ? A medicine to numb the area (local anesthetic). ? A medicine to  make you fall asleep (general anesthetic). ? A medicine that is injected into your spine to numb the area below and slightly above the injection site (spinal anesthetic). ? A medicine that is injected into an area of your body to numb everything below the injection site (regional anesthetic).  To reduce your risk of infection: ? Your health care team will wash or sanitize their hands. ? Your skin will be washed with soap.  Your surgeon will mark the area of your toe for removal.  Your surgeon will make a surgical cut (incision) in  your toe.  The dead tissue and bone will be removed.  Nerves and vessels will be tied or heated with a special tool to stop bleeding.  The area will be drained and cleaned.  If only part of a toe is removed, the remaining part will be covered with a flap of skin.  The incision will be treated in one of these ways: ? It will be closed with stitches (sutures). ? It will be left open to heal if there is an infection.  The incision area may be packed with gauze and covered with bandages (dressings).  Tissue samples may be sent to a lab to be examined under a microscope. The procedure may vary among health care providers and hospitals. What happens after the procedure?  Your blood pressure, heart rate, breathing rate, and blood oxygen level will be monitored often until the medicines you were given have worn off.  Your foot will be raised up high (elevated) to relieve swelling.  You will be monitored for pain.  You will be given pain medicines and antibiotics.  Your health care provider or physical therapist will help you to move around as soon as possible.  Do not drive for 24 hours if you received a sedative. This information is not intended to replace advice given to you by your health care provider. Make sure you discuss any questions you have with your health care provider. Document Released: 08/30/2015 Document Revised: 05/22/2016 Document Reviewed: 06/12/2015 Elsevier Interactive Patient Education  2018 [place].  Toe Amputation, Care After Refer to this sheet in the next few weeks. These instructions provide you with information on caring for yourself after your procedure. Your health care provider may also give you more specific instructions. Your treatment has been planned according to current medical practices, but problems sometimes occur. Call your health care provider if you have any problems or questions after your procedure. What can I expect after the  procedure? After your procedure, it is common to have some pain. Pain usually improves within a week. Follow these instructions at home: Medicines  Take your antibiotic medicine as told by your health care provider. Do not stop taking the antibiotic even if you start to feel better.  Take over-the-counter and prescription medicines only as told by your health care provider. Managing pain, stiffness, and swelling   If directed, apply ice to the surgical area: ? Put ice in a plastic bag. ? Place a towel between your skin and the bag. ? Leave the ice on for 20 minutes, 2-3 times a day.  Raise (elevate) your foot so it is above the level of your heart. This helps to reduce swelling.  Try to walk each day. Incision care   Follow instructions from your health care provider about how to take care of your cut from surgery (incision). Make sure you: ? Wash your hands with soap and water before you change your  bandage (dressing). If soap and water are not available, use hand sanitizer. ? Change your dressing as told by your health care provider. ? If you got stitches (sutures), leave them in place. They may need to stay in place for 2 weeks or longer.  Check your incision area every day for signs of infection. Check for: ? More redness, swelling, or pain. ? More fluid or blood. ? Warmth. ? Pus or a bad smell. Bathing  Do not take baths, swim, use a hot tub, or soak your foot until your health care provider approves.  You may shower unless you were told not to. When you shower, keep your dressing dry.  If your dressing has been removed, you may wash your skin with warm water and soap. Driving  Do not drive for 24 hours if you received a sedative.  Do not drive or operate heavy machinery while taking prescription pain medicine. Activity  Do exercises as told by your health care provider.  Return to your normal activities as told by your health care provider. Ask your health care  provider what activities are safe for you. General instructions  Do not use any tobacco products, such as cigarettes, chewing tobacco, and e-cigarettes. If you need help quitting, ask your health care provider.  Ask your health care provider about wearing special shoes or using inserts to support your foot.  If you have diabetes, keep your blood sugar under control.  Keep all follow-up visits as told by your health care provider. This is important. Contact a health care provider if:  You have more redness around your incision.  You have more fluid or blood coming from your incision.  Your incision feels warm to the touch.  You have pus or a bad smell coming from your incision.  You have a fever.  Your dressing is soaked with blood.  Your sutures tear or they separate.  You have numbness or tingling in your toes or foot.  Your foot is cool or pale, or it changes color.  Your pain does not improve after you take your medicine. Get help right away if:  You have pain or swelling that gets worse or does not go away.  You have red streaks on your skin near your toes, foot, or leg.  You have pain in your calf or behind your knee.  You have shortness of breath.  You have chest pain. This information is not intended to replace advice given to you by your health care provider. Make sure you discuss any questions you have with your health care provider. Document Released: 08/30/2015 Document Revised: 05/22/2016 Document Reviewed: 06/12/2015 Elsevier Interactive Patient Education  2018 [place] Anesthesia is a term that refers to techniques, procedures, and medicines that help a person stay safe and comfortable during a medical procedure. Monitored anesthesia care, or sedation, is one type of anesthesia. Your anesthesia specialist may recommend sedation if you will be having a procedure that does not require you to be unconscious, such  as:  Cataract surgery.  A dental procedure.  A biopsy.  A colonoscopy.  During the procedure, you may receive a medicine to help you relax (sedative). There are three levels of sedation:  Mild sedation. At this level, you may feel awake and relaxed. You will be able to follow directions.  Moderate sedation. At this level, you will be sleepy. You may not remember the procedure.  Deep sedation. At this level, you will be asleep.  You will not remember the procedure.  The more medicine you are given, the deeper your level of sedation will be. Depending on how you respond to the procedure, the anesthesia specialist may change your level of sedation or the type of anesthesia to fit your needs. An anesthesia specialist will monitor you closely during the procedure. Let your health care provider know about:  Any allergies you have.  All medicines you are taking, including vitamins, herbs, eye drops, creams, and over-the-counter medicines.  Any use of steroids (by mouth or as a cream).  Any problems you or family members have had with sedatives and anesthetic medicines.  Any blood disorders you have.  Any surgeries you have had.  Any medical conditions you have, such as sleep apnea.  Whether you are pregnant or may be pregnant.  Any use of cigarettes, alcohol, or street drugs. What are the risks? Generally, this is a safe procedure. However, problems may occur, including:  Getting too much medicine (oversedation).  Nausea.  Allergic reaction to medicines.  Trouble breathing. If this happens, a breathing tube may be used to help with breathing. It will be removed when you are awake and breathing on your own.  Heart trouble.  Lung trouble.  Before the procedure Staying hydrated Follow instructions from your health care provider about hydration, which may include:  Up to 2 hours before the procedure - you may continue to drink clear liquids, such as water, clear fruit  juice, black coffee, and plain tea.  Eating and drinking restrictions Follow instructions from your health care provider about eating and drinking, which may include:  8 hours before the procedure - stop eating heavy meals or foods such as meat, fried foods, or fatty foods.  6 hours before the procedure - stop eating light meals or foods, such as toast or cereal.  6 hours before the procedure - stop drinking milk or drinks that contain milk.  2 hours before the procedure - stop drinking clear liquids.  Medicines Ask your health care provider about:  Changing or stopping your regular medicines. This is especially important if you are taking diabetes medicines or blood thinners.  Taking medicines such as aspirin and ibuprofen. These medicines can thin your blood. Do not take these medicines before your procedure if your health care provider instructs you not to.  Tests and exams  You will have a physical exam.  You may have blood tests done to show: ? How well your kidneys and liver are working. ? How well your blood can clot.  General instructions  Plan to have someone take you home from the hospital or clinic.  If you will be going home right after the procedure, plan to have someone with you for 24 hours.  What happens during the procedure?  Your blood pressure, heart rate, breathing, level of pain and overall condition will be monitored.  An IV tube will be inserted into one of your veins.  Your anesthesia specialist will give you medicines as needed to keep you comfortable during the procedure. This may mean changing the level of sedation.  The procedure will be performed. After the procedure  Your blood pressure, heart rate, breathing rate, and blood oxygen level will be monitored until the medicines you were given have worn off.  Do not drive for 24 hours if you received a sedative.  You may: ? Feel sleepy, clumsy, or nauseous. ? Feel forgetful about what  happened after the procedure. ? Have a sore  throat if you had a breathing tube during the procedure. ? Vomit. This information is not intended to replace advice given to you by your health care provider. Make sure you discuss any questions you have with your health care provider. Document Released: 06/14/2005 Document Revised: 02/25/2016 Document Reviewed: 01/09/2016 Elsevier Interactive Patient Education  2018 [place], Care After These instructions provide you with information about caring for yourself after your procedure. Your health care provider may also give you more specific instructions. Your treatment has been planned according to current medical practices, but problems sometimes occur. Call your health care provider if you have any problems or questions after your procedure. What can I expect after the procedure? After your procedure, it is common to:  Feel sleepy for several hours.  Feel clumsy and have poor balance for several hours.  Feel forgetful about what happened after the procedure.  Have poor judgment for several hours.  Feel nauseous or vomit.  Have a sore throat if you had a breathing tube during the procedure.  Follow these instructions at home: For at least 24 hours after the procedure:   Do not: ? Participate in activities in which you could fall or become injured. ? Drive. ? Use heavy machinery. ? Drink alcohol. ? Take sleeping pills or medicines that cause drowsiness. ? Make important decisions or sign legal documents. ? Take care of children on your own.  Rest. Eating and drinking  Follow the diet that is recommended by your health care provider.  If you vomit, drink water, juice, or soup when you can drink without vomiting.  Make sure you have little or no nausea before eating solid foods. General instructions  Have a responsible adult stay with you until you are awake and alert.  Take over-the-counter and  prescription medicines only as told by your health care provider.  If you smoke, do not smoke without supervision.  Keep all follow-up visits as told by your health care provider. This is important. Contact a health care provider if:  You keep feeling nauseous or you keep vomiting.  You feel light-headed.  You develop a rash.  You have a fever. Get help right away if:  You have trouble breathing. This information is not intended to replace advice given to you by your health care provider. Make sure you discuss any questions you have with your health care provider. Document Released: 01/09/2016 Document Revised: 05/10/2016 Document Reviewed: 01/09/2016 Elsevier Interactive Patient Education  [employer].

## 2017-06-12 ENCOUNTER — Encounter (HOSPITAL_COMMUNITY)
Admission: RE | Admit: 2017-06-12 | Discharge: 2017-06-12 | Disposition: A | Discharge status: Home / Self Care | Payer: BLUE CROSS/BLUE SHIELD | Source: Ambulatory Visit | Attending: Podiatry | Admitting: Podiatry

## 2017-06-12 ENCOUNTER — Encounter (HOSPITAL_COMMUNITY): Payer: Self-pay

## 2017-06-12 ENCOUNTER — Ambulatory Visit (HOSPITAL_COMMUNITY)
Admission: RE | Admit: 2017-06-12 | Discharge: 2017-06-12 | Disposition: A | Discharge status: Home / Self Care | Payer: BLUE CROSS/BLUE SHIELD | Source: Ambulatory Visit | Attending: Podiatry | Admitting: Podiatry

## 2017-06-12 DIAGNOSIS — M79672 Pain in left foot: Secondary | ICD-10-CM

## 2017-06-12 DIAGNOSIS — K259 Gastric ulcer, unspecified as acute or chronic, without hemorrhage or perforation: Secondary | ICD-10-CM | POA: Diagnosis not present

## 2017-06-12 DIAGNOSIS — M216X2 Other acquired deformities of left foot: Secondary | ICD-10-CM | POA: Insufficient documentation

## 2017-06-12 DIAGNOSIS — Z01812 Encounter for preprocedural laboratory examination: Secondary | ICD-10-CM | POA: Diagnosis present

## 2017-06-12 DIAGNOSIS — Z01818 Encounter for other preprocedural examination: Secondary | ICD-10-CM | POA: Insufficient documentation

## 2017-06-12 DIAGNOSIS — D509 Iron deficiency anemia, unspecified: Secondary | ICD-10-CM | POA: Diagnosis not present

## 2017-06-12 DIAGNOSIS — I110 Hypertensive heart disease with heart failure: Secondary | ICD-10-CM | POA: Insufficient documentation

## 2017-06-12 DIAGNOSIS — E785 Hyperlipidemia, unspecified: Secondary | ICD-10-CM | POA: Diagnosis not present

## 2017-06-12 DIAGNOSIS — I700 Atherosclerosis of aorta: Secondary | ICD-10-CM | POA: Insufficient documentation

## 2017-06-12 DIAGNOSIS — I2510 Atherosclerotic heart disease of native coronary artery without angina pectoris: Secondary | ICD-10-CM | POA: Diagnosis not present

## 2017-06-12 DIAGNOSIS — I5041 Acute combined systolic (congestive) and diastolic (congestive) heart failure: Secondary | ICD-10-CM | POA: Insufficient documentation

## 2017-06-12 DIAGNOSIS — J449 Chronic obstructive pulmonary disease, unspecified: Secondary | ICD-10-CM | POA: Insufficient documentation

## 2017-06-12 LAB — HEMOGLOBIN AND HEMATOCRIT, BLOOD
HCT: 28.7 % — ABNORMAL LOW (ref 36.0–46.0)
Hemoglobin: 8.6 g/dL — ABNORMAL LOW (ref 12.0–15.0)

## 2017-06-12 LAB — SURGICAL PCR SCREEN
MRSA, PCR: NEGATIVE
STAPHYLOCOCCUS AUREUS: NEGATIVE

## 2017-06-13 ENCOUNTER — Ambulatory Visit (HOSPITAL_COMMUNITY)
Admission: RE | Admit: 2017-06-13 | Discharge: 2017-06-13 | Disposition: A | Discharge status: Home / Self Care | Payer: BLUE CROSS/BLUE SHIELD | Source: Ambulatory Visit | Attending: Podiatry | Admitting: Podiatry

## 2017-06-13 ENCOUNTER — Ambulatory Visit (HOSPITAL_COMMUNITY): Payer: BLUE CROSS/BLUE SHIELD

## 2017-06-13 ENCOUNTER — Ambulatory Visit (HOSPITAL_COMMUNITY): Payer: BLUE CROSS/BLUE SHIELD | Admitting: Anesthesiology

## 2017-06-13 ENCOUNTER — Encounter (HOSPITAL_COMMUNITY)
Admission: RE | Disposition: A | Discharge status: Home / Self Care | Payer: Self-pay | Source: Ambulatory Visit | Attending: Podiatry

## 2017-06-13 DIAGNOSIS — Z9889 Other specified postprocedural states: Secondary | ICD-10-CM

## 2017-06-13 DIAGNOSIS — I739 Peripheral vascular disease, unspecified: Secondary | ICD-10-CM | POA: Insufficient documentation

## 2017-06-13 DIAGNOSIS — I509 Heart failure, unspecified: Secondary | ICD-10-CM | POA: Diagnosis not present

## 2017-06-13 DIAGNOSIS — I472 Ventricular tachycardia: Secondary | ICD-10-CM | POA: Insufficient documentation

## 2017-06-13 DIAGNOSIS — J449 Chronic obstructive pulmonary disease, unspecified: Secondary | ICD-10-CM | POA: Diagnosis not present

## 2017-06-13 DIAGNOSIS — L97529 Non-pressure chronic ulcer of other part of left foot with unspecified severity: Secondary | ICD-10-CM | POA: Insufficient documentation

## 2017-06-13 DIAGNOSIS — Z8711 Personal history of peptic ulcer disease: Secondary | ICD-10-CM | POA: Diagnosis not present

## 2017-06-13 DIAGNOSIS — M2012 Hallux valgus (acquired), left foot: Secondary | ICD-10-CM | POA: Diagnosis present

## 2017-06-13 DIAGNOSIS — I2510 Atherosclerotic heart disease of native coronary artery without angina pectoris: Secondary | ICD-10-CM | POA: Insufficient documentation

## 2017-06-13 DIAGNOSIS — M2042 Other hammer toe(s) (acquired), left foot: Secondary | ICD-10-CM | POA: Insufficient documentation

## 2017-06-13 DIAGNOSIS — F17200 Nicotine dependence, unspecified, uncomplicated: Secondary | ICD-10-CM | POA: Diagnosis not present

## 2017-06-13 DIAGNOSIS — K219 Gastro-esophageal reflux disease without esophagitis: Secondary | ICD-10-CM | POA: Diagnosis not present

## 2017-06-13 DIAGNOSIS — I110 Hypertensive heart disease with heart failure: Secondary | ICD-10-CM | POA: Diagnosis not present

## 2017-06-13 HISTORY — PX: AMPUTATION: SHX166

## 2017-06-13 SURGERY — AMPUTATION DIGIT
Anesthesia: Monitor Anesthesia Care | Site: Second Toe | Laterality: Left

## 2017-06-13 MED ORDER — CLINDAMYCIN PHOSPHATE 600 MG/50ML IV SOLN: 600.0000 mg | Freq: Once | INTRAVENOUS | Status: DC

## 2017-06-13 MED ORDER — MIDAZOLAM HCL 2 MG/2ML IJ SOLN
INTRAMUSCULAR | Status: AC
  Filled 2017-06-13: qty 2

## 2017-06-13 MED ORDER — CEFAZOLIN SODIUM-DEXTROSE 2-4 GM/100ML-% IV SOLN
INTRAVENOUS | Status: AC
  Filled 2017-06-13: qty 100

## 2017-06-13 MED ORDER — LACTATED RINGERS IV SOLN
INTRAVENOUS | Status: DC
  Administered 2017-06-13: 07:00:00 via INTRAVENOUS

## 2017-06-13 MED ORDER — BUPIVACAINE HCL (PF) 0.5 % IJ SOLN
INTRAMUSCULAR | Status: DC | PRN
Start: 2017-06-13 — End: 2017-06-13
  Administered 2017-06-13: 7 mL

## 2017-06-13 MED ORDER — FENTANYL CITRATE (PF) 100 MCG/2ML IJ SOLN
INTRAMUSCULAR | Status: AC
  Filled 2017-06-13: qty 2

## 2017-06-13 MED ORDER — CHLORHEXIDINE GLUCONATE CLOTH 2 % EX PADS: 6.0000 | MEDICATED_PAD | Freq: Once | CUTANEOUS | Status: DC

## 2017-06-13 MED ORDER — FENTANYL CITRATE (PF) 100 MCG/2ML IJ SOLN
INTRAMUSCULAR | Status: AC
Start: 2017-06-13 — End: 2017-06-13
  Filled 2017-06-13: qty 2

## 2017-06-13 MED ORDER — PROPOFOL 500 MG/50ML IV EMUL
INTRAVENOUS | Status: DC | PRN
  Administered 2017-06-13: 35 ug/kg/min via INTRAVENOUS

## 2017-06-13 MED ORDER — 0.9 % SODIUM CHLORIDE (POUR BTL) OPTIME
TOPICAL | Status: DC | PRN
  Administered 2017-06-13: 1000 mL

## 2017-06-13 MED ORDER — BUPIVACAINE HCL (PF) 0.5 % IJ SOLN
INTRAMUSCULAR | Status: AC
  Filled 2017-06-13: qty 30

## 2017-06-13 MED ORDER — CLINDAMYCIN PHOSPHATE 600 MG/50ML IV SOLN
600.0000 mg | Freq: Once | INTRAVENOUS | Status: AC
  Administered 2017-06-13: 600 mg via INTRAVENOUS
  Filled 2017-06-13: qty 50

## 2017-06-13 MED ORDER — LIDOCAINE HCL (PF) 1 % IJ SOLN
INTRAMUSCULAR | Status: AC
  Filled 2017-06-13: qty 30

## 2017-06-13 MED ORDER — DEXAMETHASONE SODIUM PHOSPHATE 4 MG/ML IJ SOLN
INTRAMUSCULAR | Status: AC
  Filled 2017-06-13: qty 1

## 2017-06-13 MED ORDER — FENTANYL CITRATE (PF) 100 MCG/2ML IJ SOLN
25.0000 ug | Freq: Once | INTRAMUSCULAR | Status: AC
  Administered 2017-06-13: 25 ug via INTRAVENOUS

## 2017-06-13 MED ORDER — FENTANYL CITRATE (PF) 100 MCG/2ML IJ SOLN: 25.0000 ug | INTRAMUSCULAR | Status: DC | PRN

## 2017-06-13 MED ORDER — FENTANYL CITRATE (PF) 100 MCG/2ML IJ SOLN
INTRAMUSCULAR | Status: DC | PRN
  Administered 2017-06-13 (×2): 25 ug via INTRAVENOUS

## 2017-06-13 MED ORDER — MIDAZOLAM HCL 5 MG/5ML IJ SOLN
INTRAMUSCULAR | Status: DC | PRN
  Administered 2017-06-13: 0.5 mg via INTRAVENOUS
  Administered 2017-06-13: 1 mg via INTRAVENOUS
  Administered 2017-06-13: 0.5 mg via INTRAVENOUS

## 2017-06-13 MED ORDER — MIDAZOLAM HCL 2 MG/2ML IJ SOLN
1.0000 mg | INTRAMUSCULAR | Status: AC
  Administered 2017-06-13: 2 mg via INTRAVENOUS

## 2017-06-13 MED ORDER — PROPOFOL 10 MG/ML IV BOLUS
INTRAVENOUS | Status: AC
  Filled 2017-06-13: qty 40

## 2017-06-13 SURGICAL SUPPLY — 39 items
BAG HAMPER (MISCELLANEOUS) ×3 IMPLANT
BANDAGE ELASTIC 3 VELCRO ST LF (GAUZE/BANDAGES/DRESSINGS) ×2 IMPLANT
BANDAGE ELASTIC 4 VELCRO NS (GAUZE/BANDAGES/DRESSINGS) ×3 IMPLANT
BANDAGE ESMARK 4X12 BL STRL LF (DISPOSABLE) ×1 IMPLANT
BLADE AVERAGE 25MMX9MM (BLADE)
BLADE AVERAGE 25X9 (BLADE) IMPLANT
BLADE SURG 15 STRL LF DISP TIS (BLADE) ×1 IMPLANT
BLADE SURG 15 STRL SS (BLADE) ×3
BNDG CMPR 12X4 ELC STRL LF (DISPOSABLE) ×1
BNDG CONFORM 2 STRL LF (GAUZE/BANDAGES/DRESSINGS) ×3 IMPLANT
BNDG ESMARK 4X12 BLUE STRL LF (DISPOSABLE) ×3
BNDG GAUZE ELAST 4 BULKY (GAUZE/BANDAGES/DRESSINGS) ×3 IMPLANT
CLOTH BEACON ORANGE TIMEOUT ST (SAFETY) ×3 IMPLANT
COVER LIGHT HANDLE STERIS (MISCELLANEOUS) ×6 IMPLANT
CUFF TOURNIQUET SINGLE 18IN (TOURNIQUET CUFF) ×3 IMPLANT
DECANTER SPIKE VIAL GLASS SM (MISCELLANEOUS) ×3 IMPLANT
DRSG ADAPTIC 3X8 NADH LF (GAUZE/BANDAGES/DRESSINGS) ×3 IMPLANT
ELECT REM PT RETURN 9FT ADLT (ELECTROSURGICAL) ×3
ELECTRODE REM PT RTRN 9FT ADLT (ELECTROSURGICAL) ×1 IMPLANT
GAUZE SPONGE 4X4 12PLY STRL (GAUZE/BANDAGES/DRESSINGS) ×3 IMPLANT
GLOVE BIO SURGEON STRL SZ7.5 (GLOVE) ×3 IMPLANT
GLOVE BIOGEL PI IND STRL 7.0 (GLOVE) ×1 IMPLANT
GLOVE BIOGEL PI INDICATOR 7.0 (GLOVE) ×4
GOWN STRL REUS W/TWL LRG LVL3 (GOWN DISPOSABLE) ×6 IMPLANT
KIT ROOM TURNOVER APOR (KITS) ×3 IMPLANT
MANIFOLD NEPTUNE II (INSTRUMENTS) ×3 IMPLANT
NDL HYPO 27GX1-1/4 (NEEDLE) ×2 IMPLANT
NEEDLE HYPO 27GX1-1/4 (NEEDLE) ×6 IMPLANT
NS IRRIG 1000ML POUR BTL (IV SOLUTION) ×3 IMPLANT
PACK BASIC LIMB (CUSTOM PROCEDURE TRAY) ×3 IMPLANT
PAD ARMBOARD 7.5X6 YLW CONV (MISCELLANEOUS) ×3 IMPLANT
RASP SM TEAR CROSS CUT (RASP) IMPLANT
SET BASIN LINEN APH (SET/KITS/TRAYS/PACK) ×3 IMPLANT
SOL PREP PROV IODINE SCRUB 4OZ (MISCELLANEOUS) ×3 IMPLANT
SPONGE LAP 18X18 X RAY DECT (DISPOSABLE) ×3 IMPLANT
SUT ETHILON 4 0 PS 2 18 (SUTURE) IMPLANT
SUT PROLENE 4 0 PS 2 18 (SUTURE) ×3 IMPLANT
SUT VIC AB 4-0 PS2 27 (SUTURE) ×2 IMPLANT
SYR CONTROL 10ML LL (SYRINGE) ×6 IMPLANT

## 2017-06-13 NOTE — Op Note (Signed)
OPERATIVE NOTE  DATE OF PROCEDURE 06/13/2017  SURGEON Marcheta Grammes, DPM  ASSISTANT SURGEON Jilda Panda, DPM  OR STAFF Circulator: Cox, Gershon Mussel, RN Scrub Person: Towanda Malkin, RN Circulator Assistant: Cipriano Bunker, RN   PREOPERATIVE DIAGNOSIS 1.  Gangrene of 2nd toe, left foot 2.  Hammertoe deformity of 2nd toe, left foot 3.  Hallux valgus, left foot  POSTOPERATIVE DIAGNOSIS Same  PROCEDURE Amputation of 2nd toe at metatarsophalangeal joint, left foot  ANESTHESIA Monitor Anesthesia Care   HEMOSTASIS Pneumatic ankle tourniquet set at 250 mmHg (applied but never inflated)  ESTIMATED BLOOD LOSS Minimal (<5 cc)  MATERIALS USED None  INJECTABLES 0.5% Marcaine plain  PATHOLOGY 2nd toe, left foot  COMPLICATIONS None  INDICATIONS:  Gangrenous changes of the distal aspect of the left second toe.  Severe hallux valgus deformity of the left foot with overlapping second digit.  DESCRIPTION OF THE PROCEDURE:  The patient was brought to the operating room and placed on the operative table in the supine position.  A pneumatic ankle tourniquet was applied to the operative extremity.  Following sedation, the surgical site was anesthetized with 0.5% Marcaine plain.  The foot was then prepped, scrubbed, and draped in the usual sterile technique.    Attention was directed to the left foot.  2 converging semielliptical incisions were made circumferentially encompassing the second toe in its entirety.  Dissection was continued deep down to the level of the second metatarsophalangeal joint.  The second metatarsophalangeal joint was disarticulated.  The toe was freed of all soft tissue attachments, passed from the operative field and sent to pathology for evaluation.  The subcutaneous tissues appeared healthy and viable.  The surgical wound was irrigated with copious amounts of sterile saline.  The subcutaneous structures were reapproximated using 4-0 Vicryl.  The skin  was reapproximated using 4-0 Prolene. A sterile compressive dressing was applied to the left foot.  The patient tolerated the procedure well.  The patient was then transferred to PACU with vital signs stable and vascular status intact to all remaining digits of the operative foot.

## 2017-06-13 NOTE — Brief Op Note (Addendum)
BRIEF OPERATIVE NOTE  DATE OF PROCEDURE 06/13/2017  SURGEON Marcheta Grammes, DPM  ASSISTANT SURGEON Jilda Panda, DPM  OR STAFF Circulator: Cox, Gershon Mussel, RN Scrub Person: Towanda Malkin, RN Circulator Assistant: Cipriano Bunker, RN   PREOPERATIVE DIAGNOSIS 1.  Gangrene of 2nd toe, left foot 2.  Hammertoe deformity of 2nd toe, left foot 3.  Hallux valgus, left foot  POSTOPERATIVE DIAGNOSIS Same  PROCEDURE Amputation of 2nd toe at metatarsophalangeal joint, left foot  ANESTHESIA Monitor Anesthesia Care   HEMOSTASIS Pneumatic ankle tourniquet set at 250 mmHg (applied but never inflated)  ESTIMATED BLOOD LOSS Minimal (<5 cc)  MATERIALS USED None  INJECTABLES 0.5% Marcaine plain  PATHOLOGY 2nd toe, left foot  COMPLICATIONS None

## 2017-06-13 NOTE — Discharge Instructions (Signed)
These instructions will give you an idea of what to expect after surgery and how to manage issues that may arise before your first post op office visit. ° °Pain Management °Pain is best managed by “staying ahead” of it. If pain gets out of control, it is difficult to get it back under control. Local anesthesia that lasts 6-8 hours is used to numb the foot and decrease pain.  For the best pain control, take the pain medication every 4 hours for the first 2 days post op. On the third day pain medication can be taken as needed.  ° °Post Op Nausea °Nausea is common after surgery, so it is managed proactively.  °If prescribed, use the prescribed nausea medication regularly for the first 2 days post op. ° °Bandages °Do not worry if there is blood on the bandage. What looks like a lot of blood on the bandage is actually a small amount. Blood on the dressing spreads out as it is absorbed by the gauze, the same way a drop of water spreads out on a paper towel.  °If the bandages feel wet or dry, stiff and uncomfortable, call the office during office hours and we will schedule a time for you to have the bandage changed.  °Unless you are specifically told otherwise, we will do the first bandage change in the office.  °Keep your bandage dry. If the bandage becomes wet or soiled, notify the office and we will schedule a time to change the bandage. ° °Activity °It is best to spend most of the first 2 days after surgery lying down with the foot elevated above the level of your heart. °You may put weight on your heel while wearing the surgical shoe.   °You may only get up to go to the restroom. ° °Driving °Do not drive until you are able to respond in an emergency (i.e. slam on the brakes). This usually occurs after the bone has healed - 6 to 8 weeks. ° °Call the Office °If you have a fever over 101°F.  °If you have increasing pain after the initial post op pain has settled down.  °If you have increasing redness, swelling, or  drainage.  °If you have any questions or concerns.  ° ° °PATIENT INSTRUCTIONS °POST-ANESTHESIA ° °IMMEDIATELY FOLLOWING SURGERY:  Do not drive or operate machinery for the first twenty four hours after surgery.  Do not make any important decisions for twenty four hours after surgery or while taking narcotic pain medications or sedatives.  If you develop intractable nausea and vomiting or a severe headache please notify your doctor immediately. ° °FOLLOW-UP:  Please make an appointment with your surgeon as instructed. You do not need to follow up with anesthesia unless specifically instructed to do so. ° °WOUND CARE INSTRUCTIONS (if applicable):  Keep a dry clean dressing on the anesthesia/puncture wound site if there is drainage.  Once the wound has quit draining you may leave it open to air.  Generally you should leave the bandage intact for twenty four hours unless there is drainage.  If the epidural site drains for more than 36-48 hours please call the anesthesia department. ° °QUESTIONS?:  Please feel free to call your physician or the hospital operator if you have any questions, and they will be happy to assist you.    ° ° ° °

## 2017-06-13 NOTE — Anesthesia Preprocedure Evaluation (Signed)
Anesthesia Evaluation  Patient identified by MRN, date of birth, ID band Patient awake    Reviewed: Allergy & Precautions, H&P , NPO status , Patient's Chart, lab work & pertinent test results, reviewed documented beta blocker date and time   Airway Mallampati: II  TM Distance: >3 FB     Dental  (+) Edentulous Upper, Edentulous Lower   Pulmonary COPD, Current Smoker,    breath sounds clear to auscultation       Cardiovascular hypertension, Pt. on medications + CAD, + Peripheral Vascular Disease, +CHF, + Orthopnea and + DOE  + dysrhythmias Supra Ventricular Tachycardia  Rhythm:Regular Rate:Normal     Neuro/Psych    GI/Hepatic negative GI ROS, PUD, GERD  Medicated,  Endo/Other    Renal/GU      Musculoskeletal   Abdominal   Peds  Hematology  (+) Blood dyscrasia, anemia ,   Anesthesia Other Findings   Reproductive/Obstetrics                             Anesthesia Physical Anesthesia Plan  ASA: III  Anesthesia Plan: MAC   Post-op Pain Management:    Induction: Intravenous  PONV Risk Score and Plan:   Airway Management Planned: Simple Face Mask  Additional Equipment:   Intra-op Plan:   Post-operative Plan:   Informed Consent: I have reviewed the patients History and Physical, chart, labs and discussed the procedure including the risks, benefits and alternatives for the proposed anesthesia with the patient or authorized representative who has indicated his/her understanding and acceptance.     Plan Discussed with:   Anesthesia Plan Comments:         Anesthesia Quick Evaluation

## 2017-06-13 NOTE — Transfer of Care (Signed)
Immediate Anesthesia Transfer of Care Note  Patient: Tammy Mcpherson  Procedure(s) Performed: Procedure(s): AMPUTATION 2ND TOE LEFT FOOT (Left)  Patient Location: PACU  Anesthesia Type:MAC  Level of Consciousness: awake and patient cooperative  Airway & Oxygen Therapy: Patient Spontanous Breathing and Patient connected to face mask oxygen  Post-op Assessment: Report given to RN, Post -op Vital signs reviewed and stable and Patient moving all extremities  Post vital signs: Reviewed and stable  Last Vitals:  Vitals:   06/13/17 0715 06/13/17 0730  BP: (!) 147/63 132/62  Pulse:    Resp: (!) 21 (!) 21  Temp:    SpO2: 98% 98%    Last Pain:  Vitals:   06/13/17 0636  TempSrc: Oral      Patients Stated Pain Goal: 8 (24/40/10 2725)  Complications: No apparent anesthesia complications

## 2017-06-13 NOTE — H&P (Signed)
HISTORY AND PHYSICAL INTERVAL NOTE:  06/13/2017  7:22 AM  Tammy Mcpherson  has presented today for surgery, with the diagnosis of gangrene 2nd toe left foot, hammertoe deformity 2nd toe left foot, hallux valgus left foot.  The various methods of treatment have been discussed with the patient.  No guarantees were given.  After consideration of risks, benefits and other options for treatment, the patient has consented to surgery.  I have reviewed the patients' chart and labs.    Patient Vitals for the past 24 hrs:  BP Temp Temp src Pulse Resp SpO2 Height Weight  06/13/17 0636 (!) 147/60 98.9 F (37.2 C) Oral 91 (!) 22 98 % 5\' 5"  (1.651 m) 154 lb (69.9 kg)    A history and physical examination was performed in my office.  The patient was reexamined.  There have been no changes to this history and physical examination.  Marcheta Grammes, DPM

## 2017-06-13 NOTE — Anesthesia Postprocedure Evaluation (Signed)
Anesthesia Post Note  Patient: Tammy Mcpherson  Procedure(s) Performed: Procedure(s) (LRB): AMPUTATION 2ND TOE LEFT FOOT (Left)  Patient location during evaluation: PACU Anesthesia Type: MAC Level of consciousness: awake and patient cooperative Pain management: pain level controlled Vital Signs Assessment: post-procedure vital signs reviewed and stable Respiratory status: spontaneous breathing, nonlabored ventilation and respiratory function stable Cardiovascular status: blood pressure returned to baseline Postop Assessment: no signs of nausea or vomiting Anesthetic complications: no     Last Vitals:  Vitals:   06/13/17 0730 06/13/17 0845  BP: 132/62 129/64  Pulse:  85  Resp: (!) 21 18  Temp:  (P) 36.7 C  SpO2: 98% 100%    Last Pain:  Vitals:   06/13/17 0636  TempSrc: Oral                 [place] J

## 2017-06-14 ENCOUNTER — Encounter (HOSPITAL_COMMUNITY): Payer: Self-pay | Admitting: Podiatry

## 2017-07-02 ENCOUNTER — Other Ambulatory Visit: Payer: Self-pay | Admitting: Adult Health

## 2017-07-04 ENCOUNTER — Ambulatory Visit: Payer: BLUE CROSS/BLUE SHIELD | Admitting: Family Medicine

## 2017-07-04 ENCOUNTER — Ambulatory Visit (INDEPENDENT_AMBULATORY_CARE_PROVIDER_SITE_OTHER): Payer: BLUE CROSS/BLUE SHIELD | Admitting: Family Medicine

## 2017-07-04 ENCOUNTER — Encounter: Payer: Self-pay | Admitting: Family Medicine

## 2017-07-04 VITALS — BP 138/70 | HR 88 | Temp 98.6°F | Resp 18 | Ht 65.0 in | Wt 155.0 lb

## 2017-07-04 DIAGNOSIS — R7303 Prediabetes: Secondary | ICD-10-CM | POA: Diagnosis not present

## 2017-07-04 DIAGNOSIS — M25521 Pain in right elbow: Secondary | ICD-10-CM | POA: Diagnosis not present

## 2017-07-04 DIAGNOSIS — I739 Peripheral vascular disease, unspecified: Secondary | ICD-10-CM | POA: Diagnosis not present

## 2017-07-04 DIAGNOSIS — Z720 Tobacco use: Secondary | ICD-10-CM | POA: Diagnosis not present

## 2017-07-04 DIAGNOSIS — Z23 Encounter for immunization: Secondary | ICD-10-CM

## 2017-07-04 DIAGNOSIS — E785 Hyperlipidemia, unspecified: Secondary | ICD-10-CM

## 2017-07-04 MED ORDER — BISOPROLOL FUMARATE 10 MG PO TABS: 10.0000 mg | ORAL_TABLET | Freq: Every day | ORAL | 3 refills | Status: DC

## 2017-07-04 MED ORDER — PANTOPRAZOLE SODIUM 40 MG PO TBEC: 40.0000 mg | DELAYED_RELEASE_TABLET | Freq: Every day | ORAL | 3 refills | Status: DC

## 2017-07-04 NOTE — Patient Instructions (Addendum)
Double the pantoprazole Let me know if this does not improve the heartburn  You may stop the lasix (furosemide)  Fluid pill You must check daily for swelling in the ankles and take as needed  Go back to cardiology   Take the bisoprolol daily  See me in 3 months Need labs next time

## 2017-07-04 NOTE — Progress Notes (Signed)
Chief Complaint  Patient presents with  . Arm Pain    right elbow x 1 week   Has had two toes amputated for gangrene due to vascular insufficiency.  Still smokes.  discussed that she will continue to have morbidity and possible mortality from the smoking if she continues.  Is down to 4 cig a day.  She has nicotrol and e - cigarettes.  Is advised strongly to quit.  Discussed for 5 minutes ov visit.  Here for elbow pain.  Present about a week.  ahs not taken any meds.  No injury or overuse.  She is on plavix and taken off of omeprazole.  Does not think the 20 mg of protonix works as welll.  Is asked to double dose and let me know if this does not help.  Wants to go off of lasix.  Has not had swelling in some time.  dicussed taking PRN if she weights self daily and watch for fluid overload. Is overdue to see cardiology and is reminded  Is overdue to see GI and is reminded  Patient Active Problem List   Diagnosis Date Noted  . Postoperative state 05/09/2017  . Vitamin D deficiency 01/15/2017  . PAD (peripheral artery disease) ([place]) 01/15/2017  . Aortic atherosclerosis ([place]) 01/01/2017  . Gastric ulcer 01/01/2017  . HLD (hyperlipidemia) 01/01/2017  . Dyspnea on effort 01/01/2017  . 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 Anemia, iron deficiency 05/12/2016  . Cardiomyopathy- etiology not yet determined 05/02/2016  . CAD-Ca++ coronaries on CTA 05/02/2016  . Acute combined systolic and diastolic heart failure ([place]) 04/30/2016  . COPD exacerbation ([place]) 04/30/2016  . Essential hypertension 04/30/2016  . Tobacco use disorder 04/30/2016    Outpatient Encounter Prescriptions as of 07/04/2017  Medication Sig  . acetaminophen (TYLENOL) 500 MG tablet Take 500 mg by mouth at bedtime as needed for moderate pain.  Marland Kitchen albuterol (PROAIR HFA) 108 (90 Base) MCG/ACT inhaler INHALE 2 PUFFS INTO THE LUNGS EVERY 6 HOURS AS NEEDED FOR WHEEZING OR SHORTNESS OF BREATH. (Patient taking differently: Inhale 1-2  puffs into the lungs every 6 (six) hours as needed for wheezing or shortness of breath. )  . aspirin 81 MG chewable tablet Chew 81 mg by mouth daily.  Marland Kitchen atorvastatin (LIPITOR) 20 MG tablet Take 1 tablet (20 mg total) by mouth daily.  . bisoprolol (ZEBETA) 10 MG tablet Take 1 tablet (10 mg total) by mouth daily.  . cholecalciferol (VITAMIN D) 1000 units tablet Take 2,000 Units by mouth daily.  . clopidogrel (PLAVIX) 75 MG tablet Take 75 mg by mouth at bedtime.   . furosemide (LASIX) 20 MG tablet Take 1 tablet (20 mg total) by mouth daily. (Patient taking differently: Take 20 mg by mouth daily as needed for fluid. )  . losartan (COZAAR) 100 MG tablet Take 1 tablet (100 mg total) by mouth daily.  . Multiple Vitamin (MULTIVITAMIN WITH MINERALS) TABS tablet Take 1 tablet by mouth daily.  . pantoprazole (PROTONIX) 40 MG tablet Take 1 tablet (40 mg total) by mouth daily.   No facility-administered encounter medications on file as of 07/04/2017.     Allergies  Allergen Reactions  . Bee Venom Swelling  . Codeine Anaphylaxis    Tongue swelled  . Penicillins Swelling and Rash    Has patient had a PCN reaction causing immediate rash, facial/tongue/throat swelling, SOB or lightheadedness with hypotension: yes Has patient had a PCN reaction causing severe rash involving mucus membranes or skin necrosis: No Has  patient had a PCN reaction that required hospitalization No Has patient had a PCN reaction occurring within the last 10 years: No If all of the above answers are "NO", then may proceed with Cephalosporin use.   [place] [Pseudoephedrine Hcl] Rash    Review of Systems  Constitutional: Negative for activity change, appetite change and unexpected weight change.  HENT: Negative for congestion, dental problem, postnasal drip and rhinorrhea.   Eyes: Negative for redness and visual disturbance.  Respiratory: Positive for shortness of breath. Negative for cough.   Cardiovascular: Negative for chest  pain, palpitations and leg swelling.  Gastrointestinal: Negative for abdominal pain, constipation and diarrhea.  Genitourinary: Negative for difficulty urinating and frequency.  Musculoskeletal: Positive for arthralgias and gait problem. Negative for back pain.       Leg cramps  Neurological: Negative for dizziness and headaches.  Psychiatric/Behavioral: Positive for sleep disturbance. Negative for dysphoric mood. The patient is nervous/anxious.     BP 138/70   Pulse 88   Temp 98.6 F (37 C) (Temporal)   Resp 18   Ht 5\' 5"  (1.651 m)   Wt 155 lb 0.6 oz (70.3 kg)   BMI 25.80 kg/m   Physical Exam  Constitutional: She is oriented to person, place, and time. She appears well-developed and well-nourished. No distress.  HENT:  Head: Normocephalic and atraumatic.  Mouth/Throat: Oropharynx is clear and moist.  Eyes: Pupils are equal, round, and reactive to light. Conjunctivae are normal.  Neck: Normal range of motion.  Cardiovascular: Normal rate, regular rhythm and normal heart sounds.   Pulmonary/Chest: Effort normal and breath sounds normal. No respiratory distress. She has no wheezes.  Musculoskeletal:  Right foot examined.  Has large hallux valgus deformity first toe, absent second toe, 3 and 4 toes present, absent 5th toe.  Skin very dry - discussed skin care..  Wounds healed well.  R elbow normal except mild tenderness med epicondyle - tendinitis  Lymphadenopathy:    She has no cervical adenopathy.  Neurological: She is alert and oriented to person, place, and time.  Psychiatric: She has a normal mood and affect. Her behavior is normal.    ASSESSMENT/PLAN:  1. Tobacco abuse counseled  2. Right elbow pain Tendinitis.  Ice and stretching discussed  3. Needs flu shot - Flu Vaccine QUAD 36+ mos IM  4. Hyperlipidemia, unspecified hyperlipidemia type   5. PAD (peripheral artery disease) (HCC) - CBC - COMPLETE METABOLIC PANEL WITH GFR - Lipid panel - Urinalysis, Routine w  reflex microscopic  6. Prediabetes - Hemoglobin A1c - Microalbumin / creatinine urine ratio   Patient Instructions  Double the pantoprazole Let me know if this does not improve the heartburn  You may stop the lasix (furosemide)  Fluid pill You must check daily for swelling in the ankles and take as needed  Go back to cardiology   Take the bisoprolol daily  See me in 3 months Need labs next time     Raylene Everts, MD

## 2017-07-13 ENCOUNTER — Ambulatory Visit: Payer: BLUE CROSS/BLUE SHIELD | Admitting: Gastroenterology

## 2017-07-16 ENCOUNTER — Encounter: Payer: Self-pay | Admitting: Gastroenterology

## 2017-07-16 ENCOUNTER — Telehealth: Payer: Self-pay | Admitting: Gastroenterology

## 2017-07-16 NOTE — Telephone Encounter (Signed)
[place], HUSBAND STATED SHE IS HAVING WEAKNESS

## 2017-07-17 NOTE — Telephone Encounter (Signed)
[name] called back and Pt has been scheduled for 07/18/2017 at 10:00 Am with Roseanne Kaufman, NP.

## 2017-07-17 NOTE — Telephone Encounter (Signed)
LMOM for a return call. ( pt last seen here 06/22/2016 by Dr. Oneida Alar).

## 2017-07-17 NOTE — Telephone Encounter (Signed)
Pt's husband, [name], called and said pt's appt had to be rescheduled due to the inclement weather on Friday. It was rescheduled to end of Nov. He said she was referred for her low hemoglobin. I offered her an appt today or tomorrow in cancellation slot and he is going to check and call me right back.

## 2017-07-18 ENCOUNTER — Ambulatory Visit (INDEPENDENT_AMBULATORY_CARE_PROVIDER_SITE_OTHER): Payer: BLUE CROSS/BLUE SHIELD | Admitting: Gastroenterology

## 2017-07-18 ENCOUNTER — Encounter: Payer: Self-pay | Admitting: Gastroenterology

## 2017-07-18 ENCOUNTER — Other Ambulatory Visit: Payer: Self-pay

## 2017-07-18 VITALS — BP 157/70 | HR 105 | Temp 97.3°F | Ht 64.0 in | Wt 154.0 lb

## 2017-07-18 DIAGNOSIS — Q441 Other congenital malformations of gallbladder: Secondary | ICD-10-CM

## 2017-07-18 DIAGNOSIS — D649 Anemia, unspecified: Secondary | ICD-10-CM

## 2017-07-18 DIAGNOSIS — K279 Peptic ulcer, site unspecified, unspecified as acute or chronic, without hemorrhage or perforation: Secondary | ICD-10-CM

## 2017-07-18 LAB — CBC WITH DIFFERENTIAL/PLATELET
BASOS ABS: 57 {cells}/uL (ref 0–200)
Basophils Relative: 0.4 %
EOS ABS: 213 {cells}/uL (ref 15–500)
EOS PCT: 1.5 %
HCT: 26.5 % — ABNORMAL LOW (ref 35.0–45.0)
HEMOGLOBIN: 7.8 g/dL — AB (ref 11.7–15.5)
Lymphs Abs: 1832 cells/uL (ref 850–3900)
MCH: 20.8 pg — AB (ref 27.0–33.0)
MCHC: 29.4 g/dL — ABNORMAL LOW (ref 32.0–36.0)
MCV: 70.7 fL — ABNORMAL LOW (ref 80.0–100.0)
MPV: 10.1 fL (ref 7.5–12.5)
Monocytes Relative: 10.2 %
NEUTROS ABS: 10650 {cells}/uL — AB (ref 1500–7800)
NEUTROS PCT: 75 %
PLATELETS: 601 10*3/uL — AB (ref 140–400)
RBC: 3.75 10*6/uL — ABNORMAL LOW (ref 3.80–5.10)
RDW: 16.8 % — ABNORMAL HIGH (ref 11.0–15.0)
Total Lymphocyte: 12.9 %
WBC mixed population: 1448 cells/uL — ABNORMAL HIGH (ref 200–950)
WBC: 14.2 10*3/uL — ABNORMAL HIGH (ref 3.8–10.8)

## 2017-07-18 LAB — FOLATE: FOLATE: 12.4 ng/mL

## 2017-07-18 LAB — VITAMIN B12: VITAMIN B 12: 325 pg/mL (ref 200–1100)

## 2017-07-18 LAB — FERRITIN: FERRITIN: 6 ng/mL — AB (ref 10–232)

## 2017-07-18 NOTE — Patient Instructions (Signed)
We have scheduled you for an upper endoscopy with Dr. Oneida Alar in the near future.  I would like to check your blood work today. We will call with results.  It was a pleasure to see you today. I strive to create trusting relationships with patients to provide genuine, compassionate, and quality care. I value your feedback. If you receive a survey regarding your visit,  I greatly appreciate you the taking time to fill this out.   Annitta Needs, PhD, ANP-BC [facility]

## 2017-07-18 NOTE — Progress Notes (Addendum)
REVIEWED-NO ADDITIONAL RECOMMENDATIONS.  Referring Provider: Raylene Everts, MD Primary Care Physician:  Raylene Everts, MD Primary GI: Dr. Oneida Alar   Chief Complaint  Patient presents with  . low hemoglobin    C/o feeling dizzy    HPI:   Tammy Mcpherson is a 61 y.o. female presenting today with a history of PUD, IDA. Lost to follow-up after EGD in Oct 2017, which showed non-bleeding cratered gastric ulcers of gastric antrum, four 2-3 mm angioectasias without bleeding s/p APC. Colonoscopy at that time with diverticulosis and non-bleeding internal hemorrhoids. She has not had surveillance EGD. Steady decline in Hgb over past year. Hgb in the 13/14 range in Sept 2017, now most recently 8.6 in Sept 2018. History of SFA occlusion with limb ischemia, undergoing toe amputations recently. Known gallbladder anamoly on CT in Sept 2017 with questionable mass at gallbladder fundus. She was to see surgery but never completed. Did not want to pursue MRI due to cost.   Having dizzy spells, starting about a month ago. No falling. Feels short of breath. Feels fatigued, tired. No abdominal pain. No N/V. No melena. No bright red blood per rectum. Just tylenol products. No lower GI issues. Appetite is decent. No dysphagia. No weight loss. Protonix once daily.   Past Medical History:  Diagnosis Date  . Allergy    pollen  . Anemia 04/30/2016  . Cardiomyopathy [facility])    Diagnosed July 2017  . Combined congestive systolic and diastolic heart failure ([place])   . COPD (chronic obstructive pulmonary disease) ([place])    a. suspected COPD.  Marland Kitchen Coronary artery calcification seen on CT scan   . Essential hypertension   . GERD (gastroesophageal reflux disease)   . History of bronchitis   . Hyperlipidemia   . Iron deficiency anemia 05/12/2016   bleeding ulcers  . PSVT (paroxysmal supraventricular tachycardia) ([place])    a. first noted on tele during adm 7-05/2016.  Marland Kitchen PVC's (premature ventricular contractions)    a. first noted on tele during adm 7-05/2016.  . Substance abuse ([place])   . Thrombocytosis ([place])   . Ulcer     Past Surgical History:  Procedure Laterality Date  . ABDOMINAL HYSTERECTOMY     polyps  about age 66  . Barbie Banner OSTEOTOMY Right 07/10/2013   Procedure: Barbie Banner OSTEOTOMY RIGHT FOOT;  Surgeon: Marcheta Grammes, DPM;  Location: [facility];  Service: Orthopedics;  Laterality: Right;  . AMPUTATION Left 05/09/2017   Procedure: AMPUTATION 5TH TOE LEFT FOOT;  Surgeon: [name], DPM;  Location: [facility];  Service: Podiatry;  Laterality: Left;  . AMPUTATION Left 06/13/2017   Procedure: AMPUTATION 2ND TOE LEFT FOOT;  Surgeon: [name], DPM;  Location: [facility];  Service: Podiatry;  Laterality: Left;  . BUNIONECTOMY Right 07/10/2013   Procedure: VOGLER BUNIONECTOMY RIGHT FOOT;  Surgeon: Marcheta Grammes, DPM;  Location: [facility];  Service: Orthopedics;  Laterality: Right;  . COLONOSCOPY N/A 07/07/2016   Procedure: COLONOSCOPY;  Surgeon: Danie Binder, MD;  Location: [facility];  Service: Endoscopy;  Laterality: N/A;  2:00 pm - moved to 10:30 - office notified pt  . ESOPHAGOGASTRODUODENOSCOPY N/A 07/07/2016   Procedure: ESOPHAGOGASTRODUODENOSCOPY (EGD);  Surgeon: Danie Binder, MD;  Location: [facility];  Service: Endoscopy;  Laterality: N/A;  . IR ANGIOGRAM EXTREMITY LEFT  04/24/2017  . IR FEM POP ART ATHERECT INC PTA MOD SED  04/24/2017  . IR INFUSION THROMBOL ARTERIAL INITIAL (MS)  04/24/2017  . IR RADIOLOGIST  EVAL & MGMT  12/05/2016  . IR US GUIDE VASC ACCESS RIGHT  04/24/2017  . METATARSAL HEAD EXCISION Right 07/10/2013   Procedure: METATARSAL HEAD RESECTION OF DIGITS 2 AND 3 RIGHT FOOT;  Surgeon: Marcheta Grammes, DPM;  Location: [facility];  Service: Orthopedics;  Laterality: Right;  . PROXIMAL INTERPHALANGEAL FUSION (PIP) Right 07/10/2013   Procedure: ARTHRODESIS PIPJ  2ND DIGIT RIGHT FOOT;  Surgeon: Marcheta Grammes, DPM;  Location: [facility];  Service: Orthopedics;   Laterality: Right;    Current Outpatient Prescriptions  Medication Sig Dispense Refill  . acetaminophen (TYLENOL) 500 MG tablet Take 500 mg by mouth at bedtime as needed for moderate pain.    Marland Kitchen albuterol (PROAIR HFA) 108 (90 Base) MCG/ACT inhaler INHALE 2 PUFFS INTO THE LUNGS EVERY 6 HOURS AS NEEDED FOR WHEEZING OR SHORTNESS OF BREATH. (Patient taking differently: Inhale 1-2 puffs into the lungs every 6 (six) hours as needed for wheezing or shortness of breath. ) 18 g 3  . aspirin 81 MG chewable tablet Chew 81 mg by mouth daily.    Marland Kitchen atorvastatin (LIPITOR) 20 MG tablet Take 1 tablet (20 mg total) by mouth daily. 90 tablet 3  . bisoprolol (ZEBETA) 10 MG tablet Take 1 tablet (10 mg total) by mouth daily. 90 tablet 3  . cholecalciferol (VITAMIN D) 1000 units tablet Take 2,000 Units by mouth daily.    . clopidogrel (PLAVIX) 75 MG tablet Take 75 mg by mouth at bedtime.     . furosemide (LASIX) 20 MG tablet Take 1 tablet (20 mg total) by mouth daily. (Patient taking differently: Take 20 mg by mouth daily as needed for fluid. ) 30 tablet 6  . losartan (COZAAR) 100 MG tablet Take 1 tablet (100 mg total) by mouth daily. 90 tablet 3  . Multiple Vitamin (MULTIVITAMIN WITH MINERALS) TABS tablet Take 1 tablet by mouth daily.    . pantoprazole (PROTONIX) 40 MG tablet Take 1 tablet (40 mg total) by mouth daily. 90 tablet 3   No current facility-administered medications for this visit.     Allergies as of 07/18/2017 - Review Complete 07/18/2017  Allergen Reaction Noted  . Bee venom Swelling 05/19/2016  . Codeine Anaphylaxis 04/30/2016  . Penicillins Swelling and Rash 03/07/2012  . Sudafed [pseudoephedrine hcl] Rash 06/27/2013    Family History  Problem Relation Age of Onset  . Adopted: Yes  . Hypertension Father   . Coronary artery disease Sister   . Ulcers Maternal Grandmother   . Colon cancer Neg Hx        unknown, was adopted    Social History   Social History  . Marital status: Married      Spouse name: [name]  . Number of children: 1  . Years of education: 11   Occupational History  . disabled     heart   Social History Main Topics  . Smoking status: Current Every Day Smoker    Packs/day: 0.25    Years: 37.00    Types: Cigarettes    Start date: 05/10/1975  . Smokeless tobacco: Never Used     Comment: on nicotrol inhalers   . Alcohol use No  . Drug use: No  . Sexual activity: Yes    Birth control/ protection: Surgical   Other Topics Concern  . None   Social History Narrative   Disabled   Lives with husband [name]   Also helping mother in law and her friend   Two dogs  Review of Systems: Gen: Denies fever, chills, anorexia. Denies fatigue, weakness, weight loss.  CV: Denies chest pain, palpitations, syncope, peripheral edema, and claudication. Resp: Denies dyspnea at rest, cough, wheezing, coughing up blood, and pleurisy. GI: see HPI  Derm: Denies rash, itching, dry skin MSK: right arm hurts  Psych: Denies depression, anxiety, memory loss, confusion. No homicidal or suicidal ideation.  Heme: see HPI   Physical Exam: BP (!) 157/70   Pulse (!) 105   Temp (!) 97.3 F (36.3 C) (Oral)   Ht 5\' 4"  (1.626 m)   Wt 154 lb (69.9 kg)   BMI 26.43 kg/m  General:   Alert and oriented. No distress noted. Pleasant and cooperative.  Head:  Normocephalic and atraumatic. Eyes:  Conjuctiva clear without scleral icterus. Mouth:  Oral mucosa pink and moist.  Abdomen:  +BS, soft, non-tender and non-distended. No rebound or guarding. No HSM or masses noted. Msk:  Symmetrical without gross deformities. Normal posture. Extremities:  Without edema. Neurologic:  Alert and  oriented x4 Psych:  Alert and cooperative. Normal mood and affect.

## 2017-07-19 ENCOUNTER — Encounter: Payer: Self-pay | Admitting: Gastroenterology

## 2017-07-19 ENCOUNTER — Other Ambulatory Visit: Payer: Self-pay

## 2017-07-19 ENCOUNTER — Telehealth: Payer: Self-pay | Admitting: Gastroenterology

## 2017-07-19 ENCOUNTER — Encounter (HOSPITAL_COMMUNITY)
Admission: RE | Admit: 2017-07-19 | Discharge: 2017-07-19 | Disposition: A | Discharge status: Home / Self Care | Payer: BLUE CROSS/BLUE SHIELD | Source: Ambulatory Visit | Attending: Gastroenterology | Admitting: Gastroenterology

## 2017-07-19 DIAGNOSIS — D509 Iron deficiency anemia, unspecified: Secondary | ICD-10-CM

## 2017-07-19 DIAGNOSIS — E538 Deficiency of other specified B group vitamins: Secondary | ICD-10-CM | POA: Insufficient documentation

## 2017-07-19 DIAGNOSIS — K828 Other specified diseases of gallbladder: Secondary | ICD-10-CM

## 2017-07-19 DIAGNOSIS — F17210 Nicotine dependence, cigarettes, uncomplicated: Secondary | ICD-10-CM | POA: Diagnosis not present

## 2017-07-19 DIAGNOSIS — Z885 Allergy status to narcotic agent status: Secondary | ICD-10-CM | POA: Insufficient documentation

## 2017-07-19 DIAGNOSIS — K219 Gastro-esophageal reflux disease without esophagitis: Secondary | ICD-10-CM | POA: Insufficient documentation

## 2017-07-19 DIAGNOSIS — Z880 Allergy status to penicillin: Secondary | ICD-10-CM | POA: Insufficient documentation

## 2017-07-19 DIAGNOSIS — I2510 Atherosclerotic heart disease of native coronary artery without angina pectoris: Secondary | ICD-10-CM | POA: Diagnosis not present

## 2017-07-19 DIAGNOSIS — Z7982 Long term (current) use of aspirin: Secondary | ICD-10-CM | POA: Insufficient documentation

## 2017-07-19 DIAGNOSIS — I5042 Chronic combined systolic (congestive) and diastolic (congestive) heart failure: Secondary | ICD-10-CM | POA: Diagnosis not present

## 2017-07-19 DIAGNOSIS — Z79899 Other long term (current) drug therapy: Secondary | ICD-10-CM | POA: Insufficient documentation

## 2017-07-19 DIAGNOSIS — E785 Hyperlipidemia, unspecified: Secondary | ICD-10-CM | POA: Insufficient documentation

## 2017-07-19 DIAGNOSIS — I110 Hypertensive heart disease with heart failure: Secondary | ICD-10-CM | POA: Diagnosis not present

## 2017-07-19 DIAGNOSIS — I429 Cardiomyopathy, unspecified: Secondary | ICD-10-CM | POA: Insufficient documentation

## 2017-07-19 DIAGNOSIS — R7989 Other specified abnormal findings of blood chemistry: Secondary | ICD-10-CM

## 2017-07-19 DIAGNOSIS — I471 Supraventricular tachycardia: Secondary | ICD-10-CM | POA: Diagnosis not present

## 2017-07-19 LAB — PREPARE RBC (CROSSMATCH)

## 2017-07-19 LAB — HEMOGLOBIN AND HEMATOCRIT, BLOOD
HCT: 28.1 % — ABNORMAL LOW (ref 36.0–46.0)
HEMOGLOBIN: 8 g/dL — AB (ref 12.0–15.0)

## 2017-07-19 LAB — ABO/RH: ABO/RH(D): O POS

## 2017-07-19 NOTE — Telephone Encounter (Signed)
Paperwork on Tammy Mcpherson's desk to be signed.

## 2017-07-19 NOTE — Assessment & Plan Note (Signed)
Declined MRI. Abnormal gallbladder on CT. Discussed with patient. Will address acute issues first with anemia. She will need referral to surgeon as previously recommended. Will start this process in interim while evaluating anemia.

## 2017-07-19 NOTE — Telephone Encounter (Signed)
I reviewed blood work patient completed (CBC and anemia panel).   Her CBC is abnormal, and I reviewed with Mike Craze, NP with Oncology. Concern she has a hematological issue going on. There is definitely evidence of IDA. Her ferritin is significantly low, and this was normal last year. Hgb is now 7.8, down from 8.6 a month ago. She is symptomatic with shortness of breath, fatigue, feeling dizzy. I contacted patient about results. Here is what I recommend, and she agrees.   1. [facility] clinical pool: Cancel EGD on Friday, Oct 19th. This needs to be postponed to next available (ASAP if we can), as it is a priority to get her blood count back up due to symptomatic anemia. She STILL needs EGD as previously recommended, but we need to do a blood transfusion first.   2. [place]: let's set her up for 1 unit PRBCs. I have discussed this with her. She needs this Friday if at all possible, by latest Monday.   3. [facility] clinical pool: needs referral to HEM/ONC due to abnormal CBC, concern for myeloproliferative disorder, ?CML?, etc.

## 2017-07-19 NOTE — Assessment & Plan Note (Addendum)
61 year old female with history of anemia and known PUD, AVMs on EGD last year. Lost to follow-up and needs surveillance EGD. Feels fatigued, with Hgb dropping over the past year. Concern for worsening IDA. No overt GI bleeding. Colonoscopy is on file from last year.  1. CBC, anemia panel today completed: evidence of IDA with significantly low ferritin and decreased Hgb now to 7.8. Multiple abnormalities on CBC (Pancytopenia, chronic leukocytosis and thrombocytosis, etc). Will give 1 unit PRBCs now due to symptomatic anemia and refer to Hem/Onc for hematological evaluation (discussed with Mike Craze, NP).  2. Proceed with upper endoscopy in the near future with Dr. Oneida Alar. The risks, benefits, and alternatives have been discussed in detail with patient. They have stated understanding and desire to proceed.  Phenergan 12.5 mg IV on call 3. Continue Protonix once daily.  4. May need capsule study depending on EGD findings.   11/1 addendum: Completed blood transfusion, Hgb improvement to 9.7. Has established care with Hem/Onc. Await EGD.

## 2017-07-19 NOTE — Telephone Encounter (Signed)
Referral sent to Dr. Jenkins via Epic. °

## 2017-07-19 NOTE — Telephone Encounter (Signed)
[facility]

## 2017-07-19 NOTE — Progress Notes (Signed)
cc'ed to pcp °

## 2017-07-19 NOTE — Telephone Encounter (Signed)
Pt will go to the lab on Monday at [facility]. She is aware of the referral to Dr. Arnoldo Morale.

## 2017-07-19 NOTE — Telephone Encounter (Signed)
Called pt. EGD rescheduled to 08/03/17 at 9:15am. New instructions mailed. She is aware on hem/onc referral. She is also aware that Tamela Oddi will be in touch with her in regards to receiving blood. Called and informed Endo scheduler about EGD being rescheduled.  Referral sent to hem/onc via Epic.

## 2017-07-19 NOTE — Telephone Encounter (Signed)
Looks like she may be getting this Friday. Let's check CBC next week, Monday if possible.   [facility] clinical pool: please start referral to Dr. Arnoldo Morale due to gallbladder mass. Declining MRI.

## 2017-07-20 ENCOUNTER — Encounter (HOSPITAL_COMMUNITY): Payer: Self-pay

## 2017-07-20 ENCOUNTER — Encounter (HOSPITAL_COMMUNITY)
Admission: RE | Admit: 2017-07-20 | Discharge: 2017-07-20 | Disposition: A | Discharge status: Home / Self Care | Payer: BLUE CROSS/BLUE SHIELD | Source: Ambulatory Visit | Attending: Gastroenterology | Admitting: Gastroenterology

## 2017-07-20 DIAGNOSIS — D509 Iron deficiency anemia, unspecified: Secondary | ICD-10-CM | POA: Diagnosis not present

## 2017-07-20 MED ORDER — DIPHENHYDRAMINE HCL 25 MG PO CAPS
25.0000 mg | ORAL_CAPSULE | Freq: Once | ORAL | Status: AC
  Administered 2017-07-20: 25 mg via ORAL
  Filled 2017-07-20: qty 1

## 2017-07-20 MED ORDER — SODIUM CHLORIDE 0.9 % IV SOLN
Freq: Once | INTRAVENOUS | Status: AC
  Administered 2017-07-20: 11:00:00 via INTRAVENOUS

## 2017-07-20 MED ORDER — ACETAMINOPHEN 325 MG PO TABS
650.0000 mg | ORAL_TABLET | Freq: Once | ORAL | Status: AC
  Administered 2017-07-20: 650 mg via ORAL
  Filled 2017-07-20: qty 2

## 2017-07-20 NOTE — Progress Notes (Signed)
Results for NAKENYA, THEALL (MRN [id number]) as of 07/20/2017 11:50  Ref. Range 07/19/2017 13:52  Hemoglobin Latest Ref Range: 12.0 - 15.0 g/dL 8.0 (L)  HCT Latest Ref Range: 36.0 - 46.0 % 28.1 (L)  Sample Expiration Unknown   Antibody Screen Unknown   ABO/RH(D) Unknown O POS

## 2017-07-21 LAB — BPAM RBC
Blood Product Expiration Date: 201811162359
ISSUE DATE / TIME: 201810191034
Unit Type and Rh: 5100

## 2017-07-21 LAB — TYPE AND SCREEN
ABO/RH(D): O POS
ANTIBODY SCREEN: NEGATIVE
UNIT DIVISION: 0

## 2017-07-23 LAB — CBC WITH DIFFERENTIAL/PLATELET
BASOS ABS: 57 {cells}/uL (ref 0–200)
Basophils Relative: 0.5 %
EOS PCT: 1.6 %
Eosinophils Absolute: 182 cells/uL (ref 15–500)
HEMATOCRIT: 32.6 % — AB (ref 35.0–45.0)
HEMOGLOBIN: 9.7 g/dL — AB (ref 11.7–15.5)
LYMPHS ABS: 2314 {cells}/uL (ref 850–3900)
MCH: 21.5 pg — ABNORMAL LOW (ref 27.0–33.0)
MCHC: 29.8 g/dL — ABNORMAL LOW (ref 32.0–36.0)
MCV: 72.3 fL — AB (ref 80.0–100.0)
MPV: 10.2 fL (ref 7.5–12.5)
Monocytes Relative: 10.8 %
NEUTROS ABS: 7615 {cells}/uL (ref 1500–7800)
Neutrophils Relative %: 66.8 %
Platelets: 579 10*3/uL — ABNORMAL HIGH (ref 140–400)
RBC: 4.51 10*6/uL (ref 3.80–5.10)
RDW: 18.2 % — ABNORMAL HIGH (ref 11.0–15.0)
Total Lymphocyte: 20.3 %
WBC: 11.4 10*3/uL — ABNORMAL HIGH (ref 3.8–10.8)
WBCMIX: 1231 {cells}/uL — AB (ref 200–950)

## 2017-07-24 ENCOUNTER — Other Ambulatory Visit: Payer: Self-pay | Admitting: General Surgery

## 2017-07-24 ENCOUNTER — Ambulatory Visit (INDEPENDENT_AMBULATORY_CARE_PROVIDER_SITE_OTHER): Payer: BLUE CROSS/BLUE SHIELD | Admitting: General Surgery

## 2017-07-24 ENCOUNTER — Encounter: Payer: Self-pay | Admitting: General Surgery

## 2017-07-24 VITALS — BP 168/71 | HR 86 | Temp 98.2°F | Resp 18 | Ht 65.0 in | Wt 157.0 lb

## 2017-07-24 DIAGNOSIS — Q441 Other congenital malformations of gallbladder: Secondary | ICD-10-CM | POA: Diagnosis not present

## 2017-07-24 DIAGNOSIS — K828 Other specified diseases of gallbladder: Secondary | ICD-10-CM | POA: Diagnosis not present

## 2017-07-24 NOTE — Progress Notes (Signed)
[facility] History and Physical  Reason for Referral: Gallbladder Mass  Referring Physician:  Roseanne Kaufman, [facility]     Tammy Mcpherson is a 61 y.o. female.  HPI: Tammy Mcpherson is a 61 yo with multiple medical issues including CHF, PVD, toe gangrene, COPD, PUD and AVMs and anemia thought to be related to this who required blood this past Friday.  She was seen in the Gastroenterologist office to get surveillance of these issues given her continued anemia, and they noted the finding of a gallbladder mass on her CT scan that was obtained 06/2016 by the hematologist who was seeing her for chronic leukocytosis and thrombocytosis thought to be related to steroids.  She has not undergone any repeat imaging specifically for this finding, but did have a CT angiogram 04/2017 where they comment on the gallbladder mass being present and similar to 06/2016.  She denies any abdominal pain in the RUQ or nausea/bloating with food. She reports she does have reflux and some epigastric pain occasionally but she feels this is related to her ulcer issues.  She denies any fevers, chills or weight loss. She is chronically anemic, but denies any dark or tarry stools or BRBRP.    Past Medical History:  Diagnosis Date  . Allergy    pollen  . Anemia 04/30/2016  . Cardiomyopathy [facility]    Diagnosed July 2017  . Combined congestive systolic and diastolic heart failure ([place])   . COPD (chronic obstructive pulmonary disease) ([place])    a. suspected COPD.  Marland Kitchen Coronary artery calcification seen on CT scan   . Essential hypertension   . GERD (gastroesophageal reflux disease)   . History of bronchitis   . Hyperlipidemia   . Iron deficiency anemia 05/12/2016   bleeding ulcers  . PSVT (paroxysmal supraventricular tachycardia) ([place])    a. first noted on tele during adm 7-05/2016.  Marland Kitchen PVC's (premature ventricular contractions)    a. first noted on tele during adm 7-05/2016.  . Substance abuse  ([place])   . Thrombocytosis ([place])   . Ulcer     Past Surgical History:  Procedure Laterality Date  . ABDOMINAL HYSTERECTOMY     polyps  about age 21  . Barbie Banner OSTEOTOMY Right 07/10/2013   Procedure: Barbie Banner OSTEOTOMY RIGHT FOOT;  Surgeon: Marcheta Grammes, DPM;  Location: [facility];  Service: Orthopedics;  Laterality: Right;  . AMPUTATION Left 05/09/2017   Procedure: AMPUTATION 5TH TOE LEFT FOOT;  Surgeon: [name], DPM;  Location: [facility];  Service: Podiatry;  Laterality: Left;  . AMPUTATION Left 06/13/2017   Procedure: AMPUTATION 2ND TOE LEFT FOOT;  Surgeon: [name], DPM;  Location: [facility];  Service: Podiatry;  Laterality: Left;  . BUNIONECTOMY Right 07/10/2013   Procedure: VOGLER BUNIONECTOMY RIGHT FOOT;  Surgeon: Marcheta Grammes, DPM;  Location: [facility];  Service: Orthopedics;  Laterality: Right;  . COLONOSCOPY N/A 07/07/2016   Dr. Oneida Alar; redundant left colon, diverticulosis at hepatic flexure, non-bleeding internal hemorrhoids  . ESOPHAGOGASTRODUODENOSCOPY N/A 07/07/2016   Dr. Oneida Alar: many non-bleeding cratered gastric ulcers without stigmata of bleeding in gastric antrum. four 2-3 mm angioectasias without bleeding in duodenal bulb and second portion of duodenum s/p APC. Chroni gastritis on path.   . IR ANGIOGRAM EXTREMITY LEFT  04/24/2017  . IR FEM POP ART ATHERECT INC PTA MOD SED  04/24/2017  . IR INFUSION THROMBOL ARTERIAL INITIAL (MS)  04/24/2017  . IR RADIOLOGIST EVAL & MGMT  12/05/2016  . IR US GUIDE VASC  ACCESS RIGHT  04/24/2017  . METATARSAL HEAD EXCISION Right 07/10/2013   Procedure: METATARSAL HEAD RESECTION OF DIGITS 2 AND 3 RIGHT FOOT;  Surgeon: Marcheta Grammes, DPM;  Location: [facility];  Service: Orthopedics;  Laterality: Right;  . PROXIMAL INTERPHALANGEAL FUSION (PIP) Right 07/10/2013   Procedure: ARTHRODESIS PIPJ  2ND DIGIT RIGHT FOOT;  Surgeon: Marcheta Grammes, DPM;  Location: [facility];  Service: Orthopedics;  Laterality: Right;    Family  History  Problem Relation Age of Onset  . Adopted: Yes  . Hypertension Father   . Coronary artery disease Sister   . Ulcers Maternal Grandmother   . Colon cancer Neg Hx        unknown, was adopted    Social History  Substance Use Topics  . Smoking status: Current Every Day Smoker    Packs/day: 0.25    Years: 37.00    Types: Cigarettes    Start date: 05/10/1975  . Smokeless tobacco: Never Used     Comment: on nicotrol inhalers   . Alcohol use No    Medications: I have reviewed the patient's current medications. Allergies as of 07/24/2017      Reactions   Bee Venom Swelling   Codeine Anaphylaxis   Tongue swelled   Penicillins Swelling, Rash   Has patient had a PCN reaction causing immediate rash, facial/tongue/throat swelling, SOB or lightheadedness with hypotension: yes Has patient had a PCN reaction causing severe rash involving mucus membranes or skin necrosis: No Has patient had a PCN reaction that required hospitalization No Has patient had a PCN reaction occurring within the last 10 years: No If all of the above answers are "NO", then may proceed with Cephalosporin use.   Sudafed [pseudoephedrine Hcl] Rash      Medication List       Accurate as of 07/24/17  4:50 PM. Always use your most recent med list.          acetaminophen 500 MG tablet Commonly known as:  TYLENOL Take 500 mg by mouth at bedtime as needed for moderate pain.   albuterol 108 (90 Base) MCG/ACT inhaler Commonly known as:  PROAIR HFA INHALE 2 PUFFS INTO THE LUNGS EVERY 6 HOURS AS NEEDED FOR WHEEZING OR SHORTNESS OF BREATH.   aspirin 81 MG chewable tablet Chew 81 mg by mouth daily.   atorvastatin 20 MG tablet Commonly known as:  LIPITOR Take 1 tablet (20 mg total) by mouth daily.   bisoprolol 10 MG tablet Commonly known as:  ZEBETA Take 1 tablet (10 mg total) by mouth daily.   cholecalciferol 1000 units tablet Commonly known as:  VITAMIN D Take 1,000 Units by mouth daily.    clopidogrel 75 MG tablet Commonly known as:  PLAVIX Take 75 mg by mouth at bedtime.   furosemide 20 MG tablet Commonly known as:  LASIX Take 1 tablet (20 mg total) by mouth daily.   losartan 100 MG tablet Commonly known as:  COZAAR Take 1 tablet (100 mg total) by mouth daily.   multivitamin with minerals Tabs tablet Take 1 tablet by mouth daily.   pantoprazole 40 MG tablet Commonly known as:  PROTONIX Take 1 tablet (40 mg total) by mouth daily.        ROS:  A comprehensive review of systems was negative except for: Gastrointestinal: positive for reflux symptoms Hematologic/lymphatic: positive for anemia  Blood pressure (!) 168/71, pulse 86, temperature 98.2 F (36.8 C), resp. rate 18, height 5\' 5"  (1.651 m), weight 157 lb (  71.2 kg). Physical Exam  Constitutional: She is oriented to person, place, and time and well-developed, well-nourished, and in no distress.  HENT:  Head: Normocephalic.  Eyes: Pupils are equal, round, and reactive to light.  Cardiovascular: Normal rate and regular rhythm.   Pulmonary/Chest: Effort normal and breath sounds normal.  Abdominal: Soft. She exhibits no distension and no mass. There is no tenderness.  Musculoskeletal: Normal range of motion. She exhibits no edema.  Neurological: She is alert and oriented to person, place, and time.  Skin: Skin is warm and dry.  Psychiatric: Mood, memory, affect and judgment normal.  Vitals reviewed.   Results: Results for orders placed or performed in visit on 07/19/17 (from the past 48 hour(s))  CBC with Differential     Status: Abnormal   Collection Time: 07/23/17 11:36 AM  Result Value Ref Range   WBC 11.4 (H) 3.8 - 10.8 Thousand/uL   RBC 4.51 3.80 - 5.10 Million/uL   Hemoglobin 9.7 (L) 11.7 - 15.5 g/dL   HCT 32.6 (L) 35.0 - 45.0 %   MCV 72.3 (L) 80.0 - 100.0 fL   MCH 21.5 (L) 27.0 - 33.0 pg   MCHC 29.8 (L) 32.0 - 36.0 g/dL   RDW 18.2 (H) 11.0 - 15.0 %   Platelets 579 (H) 140 - 400 Thousand/uL    MPV 10.2 7.5 - 12.5 fL   Neutro Abs 7,615 1,500 - 7,800 cells/uL   Lymphs Abs 2,314 850 - 3,900 cells/uL   WBC mixed population 1,231 (H) 200 - 950 cells/uL   Eosinophils Absolute 182 15 - 500 cells/uL   Basophils Absolute 57 0 - 200 cells/uL   Neutrophils Relative % 66.8 %   Total Lymphocyte 20.3 %   Monocytes Relative 10.8 %   Eosinophils Relative 1.6 %   Basophils Relative 0.5 %    CT 06/2016 reviewed- fundus of gallbladder with some contraction/ calcification, could be mass CT 04/2017- similar in appearance    Assessment & Plan:  CATHLINE DOWEN is a 61 y.o. female with concern for a gallbladder mass from CT 06/2016. She has not undergone a dedicated US to evaluate this mass or to see if this is stones/ polyp.   -[place] RUQ to assess this further  -Will follow up after the [place]  -Given results of the [place], will discuss plan/ need for surgery and what that would require  Future Appointments Date Time Provider [place]  07/25/2017 3:20 PM [facility] COVERING PROVIDER [facility] None  07/30/2017 9:30 AM [facility] 5 [facility] [place] [facility]  09/12/2017 1:30 PM Annitta Needs, NP [facility]  10/04/2017 9:40 AM Raylene Everts, MD [facility]RPC RPC     All questions were answered to the satisfaction of the patient and family. Discussed that gallbladder masses can be polyps versus cancer, and that we would have more information after the [place] was performed.     Virl Cagey 07/24/2017, 4:50 PM

## 2017-07-24 NOTE — Patient Instructions (Signed)
[place] of your gallbladder to assess possible mass.   Abdominal or Pelvic Ultrasound An ultrasound is a test that takes pictures of the inside of the body. An abdominal ultrasound takes pictures of your belly. A pelvic ultrasound takes pictures of the area between your belly and thighs. An ultrasound may be done to check an organ or look for problems. If you are pregnant, it may be done to learn about your baby. What happens before the procedure?  Follow instructions from your doctor about what you cannot eat or drink.  Wear clothing that is easy to wash. Gel from the test might get on your clothes. What happens during the procedure?  A gel will be put on your skin. It may feel cool.  A wand called a transducer will be put on your skin.  The wand will take pictures. They will show on small TV screens. What happens after the procedure?  Get your test results. Ask your doctor or the department that did the test when your results will be ready.  Keep follow-up visits as told by your doctor. This is important. This information is not intended to replace advice given to you by your health care provider. Make sure you discuss any questions you have with your health care provider. Document Released: 10/21/2010 Document Revised: 05/18/2016 Document Reviewed: 06/11/2015 Elsevier Interactive Patient Education  [employer].

## 2017-07-25 ENCOUNTER — Encounter (HOSPITAL_BASED_OUTPATIENT_CLINIC_OR_DEPARTMENT_OTHER): Payer: BLUE CROSS/BLUE SHIELD | Admitting: Oncology

## 2017-07-25 ENCOUNTER — Encounter (HOSPITAL_COMMUNITY): Payer: Self-pay

## 2017-07-25 VITALS — BP 170/59 | HR 87 | Temp 98.3°F | Resp 20 | Wt 157.0 lb

## 2017-07-25 DIAGNOSIS — D508 Other iron deficiency anemias: Secondary | ICD-10-CM | POA: Diagnosis not present

## 2017-07-25 DIAGNOSIS — E538 Deficiency of other specified B group vitamins: Secondary | ICD-10-CM

## 2017-07-25 DIAGNOSIS — K829 Disease of gallbladder, unspecified: Secondary | ICD-10-CM

## 2017-07-25 NOTE — Progress Notes (Signed)
[facility] Follow Up Visit:  Patient Care Team: Raylene Everts, MD as PCP - General (Family Medicine) Danie Binder, MD as Consulting Physician (Gastroenterology)  CHIEF COMPLAINTS/PURPOSE OF CONSULTATION:  Iron deficiency anemia  HISTORY OF PRESENTING ILLNESS: Tammy Mcpherson 61 y.o. female Patient presents today for continued follow-up for her anemia and chronic leukocytosis.  She has been lost to follow-up since August 2017.  She recently had blood work done on 07/18/2017 which demonstrated a vitamin B12 level 325, folate 12.4, ferritin 6, hemoglobin 7.8.  She received 1 unit PRBC transfusion on 07/20/2017.  Hemoglobin today is up to 9.7 g/dL post transfusion.  Patient denies any evidence of blood loss including melena, hematochezia, hematuria, hemoptysis. Patient states that she feels fatigued.  Her appetite is 50%.  She denies any chest pain, shortness of breath, abdominal pain.  She has intermittent dizziness.  She is scheduled for an EGD on November 2 to rule out peptic ulcer disease.    She is also seeing GI for this questionable gallbladder mass. CTabd/pelvis 06/14/16: Question mass at gallbladder fundus 2.8 x 1.8 x 2.2 cm versus changes related to stone disease within a Phrygian cap. CTA 04/03/17 :Re- demonstration of irregular appearance of gallbladder fundus. Again, this configuration is likely related to "phrygian cap" , an incidental finding, however, a tumor difficult to rule out. Further evaluation with ultrasound may be considered.  An abdominal ultrasound is now scheduled for 07/30/2017.    Review of Systems - Oncology ROS as per HPI otherwise 12 point ROS is negative.  MEDICAL HISTORY: Past Medical History:  Diagnosis Date  . Allergy    pollen  . Anemia 04/30/2016  . Cardiomyopathy [facility])    Diagnosed July 2017  . Combined congestive systolic and diastolic heart failure ([place])   . COPD (chronic obstructive pulmonary disease) ([place])    a. suspected  COPD.  Marland Kitchen Coronary artery calcification seen on CT scan   . Essential hypertension   . GERD (gastroesophageal reflux disease)   . History of bronchitis   . Hyperlipidemia   . Iron deficiency anemia 05/12/2016   bleeding ulcers  . PSVT (paroxysmal supraventricular tachycardia) ([place])    a. first noted on tele during adm 7-05/2016.  Marland Kitchen PVC's (premature ventricular contractions)    a. first noted on tele during adm 7-05/2016.  . Substance abuse ([place])   . Thrombocytosis ([place])   . Ulcer     SURGICAL HISTORY: Past Surgical History:  Procedure Laterality Date  . ABDOMINAL HYSTERECTOMY     polyps  about age 89  . Barbie Banner OSTEOTOMY Right 07/10/2013   Procedure: Barbie Banner OSTEOTOMY RIGHT FOOT;  Surgeon: Marcheta Grammes, DPM;  Location: [facility];  Service: Orthopedics;  Laterality: Right;  . AMPUTATION Left 05/09/2017   Procedure: AMPUTATION 5TH TOE LEFT FOOT;  Surgeon: [name], DPM;  Location: [facility];  Service: Podiatry;  Laterality: Left;  . AMPUTATION Left 06/13/2017   Procedure: AMPUTATION 2ND TOE LEFT FOOT;  Surgeon: [name], DPM;  Location: [facility];  Service: Podiatry;  Laterality: Left;  . BUNIONECTOMY Right 07/10/2013   Procedure: VOGLER BUNIONECTOMY RIGHT FOOT;  Surgeon: Marcheta Grammes, DPM;  Location: [facility];  Service: Orthopedics;  Laterality: Right;  . COLONOSCOPY N/A 07/07/2016   Dr. Oneida Alar; redundant left colon, diverticulosis at hepatic flexure, non-bleeding internal hemorrhoids  . ESOPHAGOGASTRODUODENOSCOPY N/A 07/07/2016   Dr. Oneida Alar: many non-bleeding cratered gastric ulcers without stigmata of bleeding in gastric antrum. four 2-3 mm angioectasias without  bleeding in duodenal bulb and second portion of duodenum s/p APC. Chroni gastritis on path.   . IR ANGIOGRAM EXTREMITY LEFT  04/24/2017  . IR FEM POP ART ATHERECT INC PTA MOD SED  04/24/2017  . IR INFUSION THROMBOL ARTERIAL INITIAL (MS)  04/24/2017  . IR RADIOLOGIST EVAL & MGMT  12/05/2016  . IR US GUIDE VASC  ACCESS RIGHT  04/24/2017  . METATARSAL HEAD EXCISION Right 07/10/2013   Procedure: METATARSAL HEAD RESECTION OF DIGITS 2 AND 3 RIGHT FOOT;  Surgeon: Marcheta Grammes, DPM;  Location: [facility];  Service: Orthopedics;  Laterality: Right;  . PROXIMAL INTERPHALANGEAL FUSION (PIP) Right 07/10/2013   Procedure: ARTHRODESIS PIPJ  2ND DIGIT RIGHT FOOT;  Surgeon: Marcheta Grammes, DPM;  Location: [facility];  Service: Orthopedics;  Laterality: Right;    SOCIAL HISTORY: Social History   Social History  . Marital status: Married    Spouse name: [name]  . Number of children: 1  . Years of education: 60   Occupational History  . disabled     heart   Social History Main Topics  . Smoking status: Current Every Day Smoker    Packs/day: 0.25    Years: 37.00    Types: Cigarettes    Start date: 05/10/1975  . Smokeless tobacco: Never Used     Comment: on nicotrol inhalers   . Alcohol use No  . Drug use: No  . Sexual activity: Yes    Birth control/ protection: Surgical   Other Topics Concern  . Not on file   Social History Narrative   Disabled   Lives with husband [name]   Also helping mother in law and her friend   Two dogs    FAMILY HISTORY Family History  Problem Relation Age of Onset  . Adopted: Yes  . Hypertension Father   . Coronary artery disease Sister   . Ulcers Maternal Grandmother   . Colon cancer Neg Hx        unknown, was adopted    ALLERGIES:  is allergic to bee venom; codeine; penicillins; and sudafed [pseudoephedrine hcl].  MEDICATIONS:  Current Outpatient Prescriptions  Medication Sig Dispense Refill  . acetaminophen (TYLENOL) 500 MG tablet Take 500 mg by mouth at bedtime as needed for moderate pain.    Marland Kitchen albuterol (PROAIR HFA) 108 (90 Base) MCG/ACT inhaler INHALE 2 PUFFS INTO THE LUNGS EVERY 6 HOURS AS NEEDED FOR WHEEZING OR SHORTNESS OF BREATH. (Patient taking differently: Inhale 1-2 puffs into the lungs every 6 (six) hours as needed for wheezing or shortness of  breath. ) 18 g 3  . aspirin 81 MG chewable tablet Chew 81 mg by mouth daily.    Marland Kitchen atorvastatin (LIPITOR) 20 MG tablet Take 1 tablet (20 mg total) by mouth daily. 90 tablet 3  . bisoprolol (ZEBETA) 10 MG tablet Take 1 tablet (10 mg total) by mouth daily. 90 tablet 3  . cholecalciferol (VITAMIN D) 1000 units tablet Take 1,000 Units by mouth daily.     . clopidogrel (PLAVIX) 75 MG tablet Take 75 mg by mouth at bedtime.     . furosemide (LASIX) 20 MG tablet Take 1 tablet (20 mg total) by mouth daily. (Patient taking differently: Take 20 mg by mouth daily as needed for fluid. ) 30 tablet 6  . losartan (COZAAR) 100 MG tablet Take 1 tablet (100 mg total) by mouth daily. 90 tablet 3  . Multiple Vitamin (MULTIVITAMIN WITH MINERALS) TABS tablet Take 1 tablet by mouth daily.    Marland Kitchen  pantoprazole (PROTONIX) 40 MG tablet Take 1 tablet (40 mg total) by mouth daily. 90 tablet 3   No current facility-administered medications for this visit.     PHYSICAL EXAMINATION:   Vitals:   07/25/17 1530  BP: (!) 170/59  Pulse: 87  Resp: 20  Temp: 98.3 F (36.8 C)  SpO2: 97%    Filed Weights   07/25/17 1530  Weight: 157 lb (71.2 kg)     Physical Exam Constitutional: Well-developed, well-nourished, and in no distress.   HENT:  Head: Normocephalic and atraumatic.  Mouth/Throat: No oropharyngeal exudate. Mucosa moist. Eyes: Pupils are equal, round, and reactive to light. Conjunctivae are normal. No scleral icterus.  Neck: Normal range of motion. Neck supple. No JVD present.  Cardiovascular: Normal rate, regular rhythm and normal heart sounds.  Exam reveals no gallop and no friction rub.   No murmur heard. Pulmonary/Chest: Effort normal and breath sounds normal. No respiratory distress. No wheezes.No rales.  Abdominal: Soft. Bowel sounds are normal. No distension. There is no tenderness. There is no guarding.  Musculoskeletal: No edema or tenderness.  Lymphadenopathy:    No cervical or supraclavicular  adenopathy.  Neurological: Alert and oriented to person, place, and time. No cranial nerve deficit.  Skin: Skin is warm and dry. No rash noted. No erythema. No pallor.  Psychiatric: Affect and judgment normal.    LABORATORY DATA: I have personally reviewed the data as listed:  Hospital Outpatient Visit on 07/19/2017  Component Date Value Ref Range Status  . ABO/RH(D) 07/19/2017 O POS   Final  . Antibody Screen 07/19/2017 NEG   Final  . Sample Expiration 07/19/2017 07/22/2017   Final  . Unit Number 07/19/2017 [id number]   Final  . Blood Component Type 07/19/2017 RED CELLS,LR   Final  . Unit division 07/19/2017 00   Final  . Status of Unit 07/19/2017 ISSUED,FINAL   Final  . Transfusion Status 07/19/2017 OK TO TRANSFUSE   Final  . Crossmatch Result 07/19/2017 Compatible   Final  . Hemoglobin 07/19/2017 8.0* 12.0 - 15.0 g/dL Final  . HCT 07/19/2017 28.1* 36.0 - 46.0 % Final  . Order Confirmation 07/19/2017 ORDER PROCESSED BY BLOOD BANK   Final  . ABO/RH(D) 07/19/2017 O POS   Final  . ISSUE DATE / TIME 07/19/2017 [id number]   Final  . Blood Product Unit Number 07/19/2017 [id number]   Final  . PRODUCT CODE 07/19/2017 [id number]   Final  . Unit Type and Rh 07/19/2017 5100   Final  . Blood Product Expiration Date 07/19/2017 [id number]   Final  Orders Only on 07/19/2017  Component Date Value Ref Range Status  . WBC 07/23/2017 11.4* 3.8 - 10.8 Thousand/uL Final  . RBC 07/23/2017 4.51  3.80 - 5.10 Million/uL Final  . Hemoglobin 07/23/2017 9.7* 11.7 - 15.5 g/dL Final  . HCT 07/23/2017 32.6* 35.0 - 45.0 % Final  . MCV 07/23/2017 72.3* 80.0 - 100.0 fL Final  . MCH 07/23/2017 21.5* 27.0 - 33.0 pg Final  . MCHC 07/23/2017 29.8* 32.0 - 36.0 g/dL Final  . RDW 07/23/2017 18.2* 11.0 - 15.0 % Final  . Platelets 07/23/2017 579* 140 - 400 Thousand/uL Final  . MPV 07/23/2017 10.2  7.5 - 12.5 fL Final  . Neutro Abs 07/23/2017 7615  1,500 - 7,800 cells/uL Final  . Lymphs Abs 07/23/2017 2314   850 - 3,900 cells/uL Final  . WBC mixed population 07/23/2017 1231* 200 - 950 cells/uL Final  . Eosinophils Absolute 07/23/2017 182  15 - 500  cells/uL Final  . Basophils Absolute 07/23/2017 57  0 - 200 cells/uL Final  . Neutrophils Relative % 07/23/2017 66.8  % Final  . Total Lymphocyte 07/23/2017 20.3  % Final  . Monocytes Relative 07/23/2017 10.8  % Final  . Eosinophils Relative 07/23/2017 1.6  % Final  . Basophils Relative 07/23/2017 0.5  % Final  Office Visit on 07/18/2017  Component Date Value Ref Range Status  . WBC 07/18/2017 14.2* 3.8 - 10.8 Thousand/uL Final  . RBC 07/18/2017 3.75* 3.80 - 5.10 Million/uL Final  . Hemoglobin 07/18/2017 7.8* 11.7 - 15.5 g/dL Final  . HCT 07/18/2017 26.5* 35.0 - 45.0 % Final  . MCV 07/18/2017 70.7* 80.0 - 100.0 fL Final  . MCH 07/18/2017 20.8* 27.0 - 33.0 pg Final  . MCHC 07/18/2017 29.4* 32.0 - 36.0 g/dL Final  . RDW 07/18/2017 16.8* 11.0 - 15.0 % Final  . Platelets 07/18/2017 601* 140 - 400 Thousand/uL Final  . MPV 07/18/2017 10.1  7.5 - 12.5 fL Final  . Neutro Abs 07/18/2017 10650* 1,500 - 7,800 cells/uL Final  . Lymphs Abs 07/18/2017 1832  850 - 3,900 cells/uL Final  . WBC mixed population 07/18/2017 1448* 200 - 950 cells/uL Final  . Eosinophils Absolute 07/18/2017 213  15 - 500 cells/uL Final  . Basophils Absolute 07/18/2017 57  0 - 200 cells/uL Final  . Neutrophils Relative % 07/18/2017 75  % Final  . Total Lymphocyte 07/18/2017 12.9  % Final  . Monocytes Relative 07/18/2017 10.2  % Final  . Eosinophils Relative 07/18/2017 1.5  % Final  . Basophils Relative 07/18/2017 0.4  % Final  . Vitamin B-12 07/18/2017 325  200 - 1,100 pg/mL Final   Comment: . Please Note: Although the reference range for vitamin B12 is [phone] pg/mL, it has been reported that between 5 and 10% of patients with values between 200 and 400 pg/mL may experience neuropsychiatric and hematologic abnormalities due to occult B12 deficiency; less than 1% of patients  with values above 400 pg/mL will have symptoms. .   . Folate 07/18/2017 12.4  ng/mL Final   Comment:                            Reference Range                            Low:           <3.4                            Borderline:    3.4-5.4                            Normal:        >5.4 .   Marland Kitchen Ferritin 07/18/2017 6* 10 - 232 ng/mL Final    RADIOGRAPHIC STUDIES: I have personally reviewed the radiological images as listed and agree with the findings in the report  No results found.  ASSESSMENT/PLAN 1. Iron deficiency anemia- -Rule out GI bleed. Patient has a history of AVMs. -She is scheduled for an EGD on November 2 to rule out peptic ulcer disease.   -s/p recent 1 unit pRBC transfusion. -Ferritin is 6. Will set her up for 2 doses of feraheme.  2. Mild B12 deficiency -Set patient up for monthly B12. Check B12  level on next visit.  3. Gallbladder mass initially seen on CT abd/pelvis in 06/2016 -Agree with abd [place] which is scheduled for 07/30/17 by GI. If it is a mass, recommend biopsy of the mass.  RTC in 3 months for f/u with labs below.   Orders Placed This Encounter  Procedures  . CBC with Differential    Standing Status:   Future    Standing Expiration Date:   07/25/2018  . Comprehensive metabolic panel    Standing Status:   Future    Standing Expiration Date:   07/25/2018  . Iron and TIBC    Standing Status:   Future    Standing Expiration Date:   07/25/2018  . Ferritin    Standing Status:   Future    Standing Expiration Date:   07/25/2018  . Vitamin B12    Standing Status:   Future    Standing Expiration Date:   07/25/2018    All questions were answered. The patient knows to call the clinic with any problems, questions or concerns.  This note was electronically signed.    Twana First, MD  07/25/2017 3:47 PM

## 2017-07-25 NOTE — Patient Instructions (Signed)
[facility] at [facility] Discharge Instructions  RECOMMENDATIONS MADE BY THE CONSULTANT AND ANY TEST RESULTS WILL BE SENT TO YOUR REFERRING PHYSICIAN.  You were seen today by Dr. Louise Zhou    Thank you for choosing [facility] at [facility] to provide your oncology and hematology care.  To afford each patient quality time with our provider, please arrive at least 15 minutes before your scheduled appointment time.    If you have a lab appointment with the Cancer Center please come in thru the  Main Entrance and check in at the main information desk  You need to re-schedule your appointment should you arrive 10 or more minutes late.  We strive to give you quality time with our providers, and arriving late affects you and other patients whose appointments are after yours.  Also, if you no show three or more times for appointments you may be dismissed from the clinic at the providers discretion.     Again, thank you for choosing [facility].  Our hope is that these requests will decrease the amount of time that you wait before being seen by our physicians.       _____________________________________________________________  Should you have questions after your visit to [facility], please contact our office at [phone] between the hours of 8:30 a.m. and 4:30 p.m.  Voicemails left after 4:30 p.m. will not be returned until the following business day.  For prescription refill requests, have your pharmacy contact our office.       Resources For Cancer Patients and their Caregivers ? American Cancer Society: Can assist with transportation, wigs, general needs, runs Look Good Feel Better.        1-888-227-6333 ? Cancer Care: Provides financial assistance, online support groups, medication/co-pay assistance.  1-800-813-HOPE (4673) ? [facility] Assists [place] cancer patients and their  families through emotional , educational and financial support.  336-427-4357 ? [place] DSS Where to apply for food stamps, Medicaid and utility assistance. 336-342-1394 ? [facility]: Transportation to medical appointments. 336-347-2287 ? Social Security Administration: May apply for disability if have a Stage IV cancer. 336-342-7796 1-800-772-1213 ? [place] Aging, Disability and Transit Services: Assists with nutrition, care and transit needs. 336-349-2343  Cancer Center Support Programs: @10RELATIVEDAYS@ > Cancer Support Group  2nd Tuesday of the month 1pm-2pm, Journey Room  > Creative Journey  3rd Tuesday of the month 1130am-1pm, Journey Room  > Look Good Feel Better  1st Wednesday of the month 10am-12 noon, Journey Room (Call American Cancer Society to register 1-800-395-5775)    

## 2017-07-30 ENCOUNTER — Encounter (HOSPITAL_COMMUNITY): Payer: Self-pay

## 2017-07-30 ENCOUNTER — Encounter (HOSPITAL_BASED_OUTPATIENT_CLINIC_OR_DEPARTMENT_OTHER): Payer: BLUE CROSS/BLUE SHIELD

## 2017-07-30 ENCOUNTER — Ambulatory Visit (HOSPITAL_COMMUNITY)
Admission: RE | Admit: 2017-07-30 | Discharge: 2017-07-30 | Disposition: A | Discharge status: Home / Self Care | Payer: BLUE CROSS/BLUE SHIELD | Source: Ambulatory Visit | Attending: General Surgery | Admitting: General Surgery

## 2017-07-30 VITALS — BP 183/68 | HR 96 | Temp 98.1°F | Resp 20

## 2017-07-30 DIAGNOSIS — D508 Other iron deficiency anemias: Secondary | ICD-10-CM

## 2017-07-30 DIAGNOSIS — K769 Liver disease, unspecified: Secondary | ICD-10-CM | POA: Diagnosis not present

## 2017-07-30 DIAGNOSIS — E538 Deficiency of other specified B group vitamins: Secondary | ICD-10-CM | POA: Diagnosis not present

## 2017-07-30 DIAGNOSIS — K828 Other specified diseases of gallbladder: Secondary | ICD-10-CM

## 2017-07-30 DIAGNOSIS — K829 Disease of gallbladder, unspecified: Secondary | ICD-10-CM | POA: Insufficient documentation

## 2017-07-30 MED ORDER — CYANOCOBALAMIN 1000 MCG/ML IJ SOLN
1000.0000 ug | Freq: Once | INTRAMUSCULAR | Status: AC
  Administered 2017-07-30: 1000 ug via INTRAMUSCULAR

## 2017-07-30 MED ORDER — CYANOCOBALAMIN 1000 MCG/ML IJ SOLN
INTRAMUSCULAR | Status: AC
  Filled 2017-07-30: qty 1

## 2017-07-30 NOTE — Progress Notes (Signed)
Tammy Mcpherson presents today for injection per the provider's orders.  B12 administration without incident; see MAR for injection details.  Patient tolerated procedure well and without incident.  No questions or complaints noted at this time.  Discharged ambulatory.  

## 2017-07-31 ENCOUNTER — Ambulatory Visit (HOSPITAL_COMMUNITY): Payer: BLUE CROSS/BLUE SHIELD

## 2017-07-31 ENCOUNTER — Telehealth: Payer: Self-pay | Admitting: Radiology

## 2017-07-31 ENCOUNTER — Telehealth: Payer: Self-pay | Admitting: General Surgery

## 2017-07-31 NOTE — Telephone Encounter (Signed)
Notified patient of [place] results of likely bile / sludge at fundus and less likely any gallbladder cancer.  Discussed laparoscopic cholecystectomy to remove gallbladder. Want the patient to have preoperative risk stratification before getting surgery.   Bunnie Domino follows her for cardiology, will refer over.  Will discuss with Dr. Patsey Berthold, anesthesia regarding lap chole in patient with cardiomyopathy.  Curlene Labrum, MD [facility] [address] [place], [place] [address] [phone] (office)

## 2017-07-31 NOTE — Telephone Encounter (Signed)
Phoned patient to schedule follow up appointments as requested by Dr Corrie Mckusick.  Patient states that she prefers to postpone follow up for Dec 2018.  She will call back when ready to schedule.  Tammy Mcpherson [place], RN 07/31/2017 10:34 AM

## 2017-08-02 ENCOUNTER — Encounter (HOSPITAL_COMMUNITY): Payer: BLUE CROSS/BLUE SHIELD | Attending: Oncology

## 2017-08-02 ENCOUNTER — Encounter (HOSPITAL_COMMUNITY): Payer: Self-pay

## 2017-08-02 VITALS — BP 151/45 | HR 78 | Temp 98.1°F | Resp 18

## 2017-08-02 DIAGNOSIS — I471 Supraventricular tachycardia: Secondary | ICD-10-CM | POA: Insufficient documentation

## 2017-08-02 DIAGNOSIS — E785 Hyperlipidemia, unspecified: Secondary | ICD-10-CM | POA: Insufficient documentation

## 2017-08-02 DIAGNOSIS — I110 Hypertensive heart disease with heart failure: Secondary | ICD-10-CM | POA: Insufficient documentation

## 2017-08-02 DIAGNOSIS — D509 Iron deficiency anemia, unspecified: Secondary | ICD-10-CM | POA: Insufficient documentation

## 2017-08-02 DIAGNOSIS — K219 Gastro-esophageal reflux disease without esophagitis: Secondary | ICD-10-CM | POA: Insufficient documentation

## 2017-08-02 DIAGNOSIS — D508 Other iron deficiency anemias: Secondary | ICD-10-CM | POA: Diagnosis not present

## 2017-08-02 DIAGNOSIS — Z880 Allergy status to penicillin: Secondary | ICD-10-CM | POA: Insufficient documentation

## 2017-08-02 DIAGNOSIS — F17210 Nicotine dependence, cigarettes, uncomplicated: Secondary | ICD-10-CM | POA: Insufficient documentation

## 2017-08-02 DIAGNOSIS — Z885 Allergy status to narcotic agent status: Secondary | ICD-10-CM | POA: Insufficient documentation

## 2017-08-02 DIAGNOSIS — E538 Deficiency of other specified B group vitamins: Secondary | ICD-10-CM | POA: Insufficient documentation

## 2017-08-02 DIAGNOSIS — I429 Cardiomyopathy, unspecified: Secondary | ICD-10-CM | POA: Insufficient documentation

## 2017-08-02 DIAGNOSIS — I2510 Atherosclerotic heart disease of native coronary artery without angina pectoris: Secondary | ICD-10-CM | POA: Insufficient documentation

## 2017-08-02 DIAGNOSIS — Z79899 Other long term (current) drug therapy: Secondary | ICD-10-CM | POA: Insufficient documentation

## 2017-08-02 DIAGNOSIS — I5042 Chronic combined systolic (congestive) and diastolic (congestive) heart failure: Secondary | ICD-10-CM | POA: Insufficient documentation

## 2017-08-02 DIAGNOSIS — Z7982 Long term (current) use of aspirin: Secondary | ICD-10-CM | POA: Insufficient documentation

## 2017-08-02 MED ORDER — SODIUM CHLORIDE 0.9 % IV SOLN
510.0000 mg | Freq: Once | INTRAVENOUS | Status: AC
  Administered 2017-08-02: 510 mg via INTRAVENOUS
  Filled 2017-08-02: qty 17

## 2017-08-02 MED ORDER — SODIUM CHLORIDE 0.9 % IV SOLN
Freq: Once | INTRAVENOUS | Status: AC
  Administered 2017-08-02: 13:00:00 via INTRAVENOUS

## 2017-08-02 NOTE — Patient Instructions (Signed)
[facility] at [facility] Discharge Instructions  RECOMMENDATIONS MADE BY THE CONSULTANT AND ANY TEST RESULTS WILL BE SENT TO YOUR REFERRING PHYSICIAN.  Received Feraheme infusion today.Follow-up as scheduled. Call clinic for any questions or concerns  Thank you for choosing [facility] at [facility] to provide your oncology and hematology care.  To afford each patient quality time with our provider, please arrive at least 15 minutes before your scheduled appointment time.    If you have a lab appointment with the Cancer Center please come in thru the  Main Entrance and check in at the main information desk  You need to re-schedule your appointment should you arrive 10 or more minutes late.  We strive to give you quality time with our providers, and arriving late affects you and other patients whose appointments are after yours.  Also, if you no show three or more times for appointments you may be dismissed from the clinic at the providers discretion.     Again, thank you for choosing [facility].  Our hope is that these requests will decrease the amount of time that you wait before being seen by our physicians.       _____________________________________________________________  Should you have questions after your visit to [facility], please contact our office at [phone] between the hours of 8:30 a.m. and 4:30 p.m.  Voicemails left after 4:30 p.m. will not be returned until the following business day.  For prescription refill requests, have your pharmacy contact our office.       Resources For Cancer Patients and their Caregivers ? American Cancer Society: Can assist with transportation, wigs, general needs, runs Look Good Feel Better.        1-888-227-6333 ? Cancer Care: Provides financial assistance, online support groups, medication/co-pay assistance.  1-800-813-HOPE (4673) ? [facility] Assists [place] cancer patients and their families through emotional , educational and financial support.  336-427-4357 ? [place] DSS Where to apply for food stamps, Medicaid and utility assistance. 336-342-1394 ? RCATS: Transportation to medical appointments. 336-347-2287 ? Social Security Administration: May apply for disability if have a Stage IV cancer. 336-342-7796 1-800-772-1213 ? [place] Aging, Disability and Transit Services: Assists with nutrition, care and transit needs. 336-349-2343  Cancer Center Support Programs: @10RELATIVEDAYS@ > Cancer Support Group  2nd Tuesday of the month 1pm-2pm, Journey Room  > Creative Journey  3rd Tuesday of the month 1130am-1pm, Journey Room  > Look Good Feel Better  1st Wednesday of the month 10am-12 noon, Journey Room (Call American Cancer Society to register 1-800-395-5775)   

## 2017-08-02 NOTE — Progress Notes (Signed)
Tammy Mcpherson tolerated Feraheme infusion well without complaints or incident. VSS upon discharge. Pt discharged self ambulatory in satisfactory condition accompanied by her husband

## 2017-08-03 ENCOUNTER — Encounter (HOSPITAL_COMMUNITY)
Admission: RE | Disposition: A | Discharge status: Home / Self Care | Payer: Self-pay | Source: Ambulatory Visit | Attending: Gastroenterology

## 2017-08-03 ENCOUNTER — Ambulatory Visit (HOSPITAL_COMMUNITY)
Admission: RE | Admit: 2017-08-03 | Discharge: 2017-08-03 | Disposition: A | Discharge status: Home / Self Care | Payer: BLUE CROSS/BLUE SHIELD | Source: Ambulatory Visit | Attending: Gastroenterology | Admitting: Gastroenterology

## 2017-08-03 ENCOUNTER — Encounter (HOSPITAL_COMMUNITY): Payer: Self-pay | Admitting: *Deleted

## 2017-08-03 DIAGNOSIS — I5042 Chronic combined systolic (congestive) and diastolic (congestive) heart failure: Secondary | ICD-10-CM | POA: Diagnosis not present

## 2017-08-03 DIAGNOSIS — J449 Chronic obstructive pulmonary disease, unspecified: Secondary | ICD-10-CM | POA: Diagnosis not present

## 2017-08-03 DIAGNOSIS — Z79899 Other long term (current) drug therapy: Secondary | ICD-10-CM | POA: Insufficient documentation

## 2017-08-03 DIAGNOSIS — Z7902 Long term (current) use of antithrombotics/antiplatelets: Secondary | ICD-10-CM | POA: Insufficient documentation

## 2017-08-03 DIAGNOSIS — K31819 Angiodysplasia of stomach and duodenum without bleeding: Secondary | ICD-10-CM | POA: Diagnosis not present

## 2017-08-03 DIAGNOSIS — E785 Hyperlipidemia, unspecified: Secondary | ICD-10-CM | POA: Diagnosis not present

## 2017-08-03 DIAGNOSIS — K2960 Other gastritis without bleeding: Secondary | ICD-10-CM | POA: Diagnosis not present

## 2017-08-03 DIAGNOSIS — I429 Cardiomyopathy, unspecified: Secondary | ICD-10-CM | POA: Diagnosis not present

## 2017-08-03 DIAGNOSIS — K289 Gastrojejunal ulcer, unspecified as acute or chronic, without hemorrhage or perforation: Secondary | ICD-10-CM | POA: Insufficient documentation

## 2017-08-03 DIAGNOSIS — I471 Supraventricular tachycardia: Secondary | ICD-10-CM | POA: Diagnosis not present

## 2017-08-03 DIAGNOSIS — K3189 Other diseases of stomach and duodenum: Secondary | ICD-10-CM | POA: Diagnosis not present

## 2017-08-03 DIAGNOSIS — K219 Gastro-esophageal reflux disease without esophagitis: Secondary | ICD-10-CM | POA: Insufficient documentation

## 2017-08-03 DIAGNOSIS — Z7982 Long term (current) use of aspirin: Secondary | ICD-10-CM | POA: Diagnosis not present

## 2017-08-03 DIAGNOSIS — I110 Hypertensive heart disease with heart failure: Secondary | ICD-10-CM | POA: Diagnosis not present

## 2017-08-03 DIAGNOSIS — K5520 Angiodysplasia of colon without hemorrhage: Secondary | ICD-10-CM

## 2017-08-03 DIAGNOSIS — F17210 Nicotine dependence, cigarettes, uncomplicated: Secondary | ICD-10-CM | POA: Diagnosis not present

## 2017-08-03 DIAGNOSIS — D509 Iron deficiency anemia, unspecified: Secondary | ICD-10-CM | POA: Insufficient documentation

## 2017-08-03 DIAGNOSIS — I2510 Atherosclerotic heart disease of native coronary artery without angina pectoris: Secondary | ICD-10-CM | POA: Insufficient documentation

## 2017-08-03 DIAGNOSIS — K279 Peptic ulcer, site unspecified, unspecified as acute or chronic, without hemorrhage or perforation: Secondary | ICD-10-CM

## 2017-08-03 HISTORY — PX: ESOPHAGOGASTRODUODENOSCOPY: SHX5428

## 2017-08-03 SURGERY — EGD (ESOPHAGOGASTRODUODENOSCOPY)
Anesthesia: Moderate Sedation

## 2017-08-03 MED ORDER — SODIUM CHLORIDE 0.9 % IV SOLN
INTRAVENOUS | Status: DC
  Administered 2017-08-03: 1000 mL via INTRAVENOUS

## 2017-08-03 MED ORDER — MEPERIDINE HCL 100 MG/ML IJ SOLN
INTRAMUSCULAR | Status: AC
  Filled 2017-08-03: qty 2

## 2017-08-03 MED ORDER — STERILE WATER FOR IRRIGATION IR SOLN
Status: DC | PRN
  Administered 2017-08-03: 2.5 mL

## 2017-08-03 MED ORDER — LIDOCAINE VISCOUS 2 % MT SOLN
OROMUCOSAL | Status: DC | PRN
  Administered 2017-08-03: 1 via OROMUCOSAL

## 2017-08-03 MED ORDER — LIDOCAINE VISCOUS 2 % MT SOLN
OROMUCOSAL | Status: AC
  Filled 2017-08-03: qty 15

## 2017-08-03 MED ORDER — MIDAZOLAM HCL 5 MG/5ML IJ SOLN
INTRAMUSCULAR | Status: AC
  Filled 2017-08-03: qty 10

## 2017-08-03 MED ORDER — MEPERIDINE HCL 100 MG/ML IJ SOLN
INTRAMUSCULAR | Status: DC | PRN
  Administered 2017-08-03: 25 mg via INTRAVENOUS
  Administered 2017-08-03: 50 mg via INTRAVENOUS

## 2017-08-03 MED ORDER — MIDAZOLAM HCL 5 MG/5ML IJ SOLN
INTRAMUSCULAR | Status: DC | PRN
  Administered 2017-08-03 (×2): 2 mg via INTRAVENOUS

## 2017-08-03 NOTE — Discharge Instructions (Signed)
You have EROSIVE GASTRITIS DUE TO ASPIRIN USE. I CAUTERIZED THE RED SPOTS(AVMs) IN YOUR STOMACH AND SMALL BOWEL.    TRY TO STOP SMOKING.  DRINK WATER TO KEEP YOUR URINE LIGHT YELLOW.  FOLLOW A LOW FAT DIET. SEE INFO BELOW.  CONTINUE ASPIRIN AND PLAVIX.  CONTINUE PROTONIX. TAKE 30 MINUTES PRIOR TO BREAKFAST EVERY DAY.  FOLLOW UP IN 4 MOS.  UPPER ENDOSCOPY AFTER CARE Read the instructions outlined below and refer to this sheet in the next week. These discharge instructions provide you with general information on caring for yourself after you leave the hospital. While your treatment has been planned according to the most current medical practices available, unavoidable complications occasionally occur. If you have any problems or questions after discharge, call DR. Mahnoor Mathisen, [phone].  ACTIVITY  You may resume your regular activity, but move at a slower pace for the next 24 hours.   Take frequent rest periods for the next 24 hours.   Walking will help get rid of the air and reduce the bloated feeling in your belly (abdomen).   No driving for 24 hours (because of the medicine (anesthesia) used during the test).   You may shower.   Do not sign any important legal documents or operate any machinery for 24 hours (because of the anesthesia used during the test).    NUTRITION  Drink plenty of fluids.   You may resume your normal diet as instructed by your doctor.   Begin with a light meal and progress to your normal diet. Heavy or fried foods are harder to digest and may make you feel sick to your stomach (nauseated).   Avoid alcoholic beverages for 24 hours or as instructed.    MEDICATIONS  You may resume your normal medications.   WHAT YOU CAN EXPECT TODAY  Some feelings of bloating in the abdomen.   Passage of more gas than usual.    IF YOU HAD A BIOPSY TAKEN DURING THE UPPER ENDOSCOPY:  Eat a soft diet IF YOU HAVE NAUSEA, BLOATING, ABDOMINAL PAIN, OR  VOMITING.    FINDING OUT THE RESULTS OF YOUR TEST Not all test results are available during your visit. DR. Oneida Alar WILL CALL YOU WITHIN 14 DAYS OF YOUR PROCEDUE WITH YOUR RESULTS. Do not assume everything is normal if you have not heard from DR. Bay Wayson IN ONE WEEK, CALL HER OFFICE AT [phone].  SEEK IMMEDIATE MEDICAL ATTENTION AND CALL THE OFFICE: [phone] IF:  You have more than a spotting of blood in your stool.   Your belly is swollen (abdominal distention).   You are nauseated or vomiting.   You have a temperature over 101F.   You have abdominal pain or discomfort that is severe or gets worse throughout the day.   Gastritis  Gastritis is an inflammation (the body's way of reacting to injury and/or infection) of the stomach. It is often caused by viral or bacterial (germ) infections. It can also be caused BY ASPIRIN, BC/GOODY POWDER'S, (IBUPROFEN) MOTRIN, OR ALEVE (NAPROXEN), chemicals (including alcohol), SPICY FOODS, and medications. This illness may be associated with generalized malaise (feeling tired, not well), UPPER ABDOMINAL STOMACH cramps, and fever. One common bacterial cause of gastritis is an organism known as H. Pylori. This can be treated with antibiotics.

## 2017-08-03 NOTE — H&P (Signed)
Primary Care Physician:  Raylene Everts, MD Primary Gastroenterologist:  Dr. Oneida Alar  Pre-Procedure History & Physical: HPI:  Tammy Mcpherson is a 61 y.o. female here for PUD-Oct 2017/anemia.  Past Medical History:  Diagnosis Date  . Allergy    pollen  . Anemia 04/30/2016  . Cardiomyopathy [facility])    Diagnosed July 2017  . Combined congestive systolic and diastolic heart failure ([place])   . COPD (chronic obstructive pulmonary disease) ([place])    a. suspected COPD.  Marland Kitchen Coronary artery calcification seen on CT scan   . Essential hypertension   . GERD (gastroesophageal reflux disease)   . History of bronchitis   . Hyperlipidemia   . Iron deficiency anemia 05/12/2016   bleeding ulcers  . PSVT (paroxysmal supraventricular tachycardia) ([place])    a. first noted on tele during adm 7-05/2016.  Marland Kitchen PVC's (premature ventricular contractions)    a. first noted on tele during adm 7-05/2016.  . Substance abuse ([place])   . Thrombocytosis ([place])   . Ulcer     Past Surgical History:  Procedure Laterality Date  . ABDOMINAL HYSTERECTOMY     polyps  about age 41  . Barbie Banner OSTEOTOMY Right 07/10/2013   Procedure: Barbie Banner OSTEOTOMY RIGHT FOOT;  Surgeon: Marcheta Grammes, DPM;  Location: [facility];  Service: Orthopedics;  Laterality: Right;  . AMPUTATION Left 05/09/2017   Procedure: AMPUTATION 5TH TOE LEFT FOOT;  Surgeon: [name], DPM;  Location: [facility];  Service: Podiatry;  Laterality: Left;  . AMPUTATION Left 06/13/2017   Procedure: AMPUTATION 2ND TOE LEFT FOOT;  Surgeon: [name], DPM;  Location: [facility];  Service: Podiatry;  Laterality: Left;  . BUNIONECTOMY Right 07/10/2013   Procedure: VOGLER BUNIONECTOMY RIGHT FOOT;  Surgeon: Marcheta Grammes, DPM;  Location: [facility];  Service: Orthopedics;  Laterality: Right;  . COLONOSCOPY N/A 07/07/2016   Dr. Oneida Alar; redundant left colon, diverticulosis at hepatic flexure, non-bleeding internal hemorrhoids  . ESOPHAGOGASTRODUODENOSCOPY N/A  07/07/2016   Dr. Oneida Alar: many non-bleeding cratered gastric ulcers without stigmata of bleeding in gastric antrum. four 2-3 mm angioectasias without bleeding in duodenal bulb and second portion of duodenum s/p APC. Chroni gastritis on path.   . IR ANGIOGRAM EXTREMITY LEFT  04/24/2017  . IR FEM POP ART ATHERECT INC PTA MOD SED  04/24/2017  . IR INFUSION THROMBOL ARTERIAL INITIAL (MS)  04/24/2017  . IR RADIOLOGIST EVAL & MGMT  12/05/2016  . IR US GUIDE VASC ACCESS RIGHT  04/24/2017  . METATARSAL HEAD EXCISION Right 07/10/2013   Procedure: METATARSAL HEAD RESECTION OF DIGITS 2 AND 3 RIGHT FOOT;  Surgeon: Marcheta Grammes, DPM;  Location: [facility];  Service: Orthopedics;  Laterality: Right;  . PROXIMAL INTERPHALANGEAL FUSION (PIP) Right 07/10/2013   Procedure: ARTHRODESIS PIPJ  2ND DIGIT RIGHT FOOT;  Surgeon: Marcheta Grammes, DPM;  Location: [facility];  Service: Orthopedics;  Laterality: Right;    Prior to Admission medications   Medication Sig Start Date End Date Taking? Authorizing Provider  acetaminophen (TYLENOL) 500 MG tablet Take 500 mg by mouth at bedtime as needed for moderate pain.   Yes [provider]  albuterol (PROAIR HFA) 108 (90 Base) MCG/ACT inhaler INHALE 2 PUFFS INTO THE LUNGS EVERY 6 HOURS AS NEEDED FOR WHEEZING OR SHORTNESS OF BREATH. Patient taking differently: Inhale 1-2 puffs into the lungs every 6 (six) hours as needed for wheezing or shortness of breath.  05/21/17  Yes Raylene Everts, MD  aspirin 81 MG chewable tablet Chew 81  mg by mouth daily.   Yes [provider]  atorvastatin (LIPITOR) 20 MG tablet Take 1 tablet (20 mg total) by mouth daily. 01/01/17  Yes Raylene Everts, MD  bisoprolol (ZEBETA) 10 MG tablet Take 1 tablet (10 mg total) by mouth daily. 07/04/17  Yes Raylene Everts, MD  cholecalciferol (VITAMIN D) 1000 units tablet Take 1,000 Units by mouth daily.    Yes [provider]  clopidogrel (PLAVIX) 75 MG tablet Take 75 mg by  mouth at bedtime.    Yes [provider]  furosemide (LASIX) 20 MG tablet Take 1 tablet (20 mg total) by mouth daily. Patient taking differently: Take 20 mg by mouth daily as needed for fluid.  06/06/16  Yes Lendon Colonel, NP  losartan (COZAAR) 100 MG tablet Take 1 tablet (100 mg total) by mouth daily. 01/01/17  Yes Raylene Everts, MD  Multiple Vitamin (MULTIVITAMIN WITH MINERALS) TABS tablet Take 1 tablet by mouth daily.   Yes [provider]  pantoprazole (PROTONIX) 40 MG tablet Take 1 tablet (40 mg total) by mouth daily. 07/04/17  Yes Raylene Everts, MD    Allergies as of 07/18/2017 - Review Complete 07/18/2017  Allergen Reaction Noted  . Bee venom Swelling 05/19/2016  . Codeine Anaphylaxis 04/30/2016  . Penicillins Swelling and Rash 03/07/2012  . Sudafed [pseudoephedrine hcl] Rash 06/27/2013    Family History  Problem Relation Age of Onset  . Adopted: Yes  . Hypertension Father   . Coronary artery disease Sister   . Ulcers Maternal Grandmother   . Colon cancer Neg Hx        unknown, was adopted    Social History   Social History  . Marital status: Married    Spouse name: [name]  . Number of children: 1  . Years of education: 63   Occupational History  . disabled     heart   Social History Main Topics  . Smoking status: Current Every Day Smoker    Packs/day: 0.25    Years: 37.00    Types: Cigarettes    Start date: 05/10/1975  . Smokeless tobacco: Never Used     Comment: on nicotrol inhalers   . Alcohol use No  . Drug use: No  . Sexual activity: Yes    Birth control/ protection: Surgical   Other Topics Concern  . Not on file   Social History Narrative   Disabled   Lives with husband [name]   Also helping mother in law and her friend   Two dogs    Review of Systems: See HPI, otherwise negative ROS   Physical Exam: BP (!) 145/61   Pulse 83   Temp 97.6 F (36.4 C) (Oral)   Resp 18   Ht 5\' 5"  (1.651 m)   Wt 154 lb (69.9 kg)    SpO2 98%   BMI 25.63 kg/m  General:   Alert,  pleasant and cooperative in NAD Head:  Normocephalic and atraumatic. Neck:  Supple; Lungs:  Clear throughout to auscultation.    Heart:  Regular rate and rhythm. Abdomen:  Soft, nontender and nondistended. Normal bowel sounds, without guarding, and without rebound.   Neurologic:  Alert and  oriented x4;  grossly normal neurologically.  Impression/Plan:     PUD-Oct 2017/anemia  PLAN:  REPEAT EGD TO CONFIRM ULCERS ARE HEALED. DISCUSSED PROCEDURE, BENEFITS, & RISKS: < 1% chance of medication reaction, OR bleeding.

## 2017-08-03 NOTE — Op Note (Signed)
[facility] Patient Name: Tammy Mcpherson Procedure Date: 08/03/2017 9:09 AM MRN: [id number] Date of Birth: January 18, 1956 Attending MD: Barney Drain MD, MD CSN: [id number] Age: 61 Admit Type: Outpatient Procedure:                Upper GI endoscopy Indications:              Iron deficiency anemia due to suspected upper                            gastrointestinal bleeding, Follow-up of ulcer of                            the GI tract Providers:                Barney Drain MD, MD, Lurline Del, RN, Aram Candela Referring MD:             Lysle Morales Medicines:                Promethazine 12.5 mg IV, Meperidine 75 mg IV,                            Midazolam 4 mg IV Complications:            No immediate complications. Estimated Blood Loss:     Estimated blood loss: none. Procedure:                Pre-Anesthesia Assessment:                           - Prior to the procedure, a History and Physical                            was performed, and patient medications and                            allergies were reviewed. The patient's tolerance of                            previous anesthesia was also reviewed. The risks                            and benefits of the procedure and the sedation                            options and risks were discussed with the patient.                            All questions were answered, and informed consent                            was obtained. Prior Anticoagulants: The patient has                            taken aspirin, last dose was day of procedure. ASA  Grade Assessment: II - A patient with mild systemic                            disease. After reviewing the risks and benefits,                            the patient was deemed in satisfactory condition to                            undergo the procedure.                           After obtaining informed consent, the endoscope was                            passed under  direct vision. Throughout the                            procedure, the patient's blood pressure, pulse, and                            oxygen saturations were monitored continuously. The                            [id number] ([id number]) scope was introduced through the                            mouth, and advanced to the second part of duodenum.                            After obtaining informed consent, the endoscope was                            passed under direct vision. Throughout the                            procedure, the patient's blood pressure, pulse, and                            oxygen saturations were monitored continuously.The                            upper GI endoscopy was accomplished without                            difficulty. The patient tolerated the procedure                            well. Scope In: 9:41:37 AM Scope Out: 9:48:01 AM Total Procedure Duration: 0 hours 6 minutes 24 seconds  Findings:      The examined esophagus was normal.      A single no bleeding angioectasia was found on the greater curvature of       the gastric body. Coagulation for bleeding prevention using argon  plasma       at 0.5 liters/minute and 20 watts was successful.      A single localized, small non-bleeding erosion was found in the gastric       antrum. There were no stigmata of recent bleeding.      Four 1 to 3 mm angioectasias without bleeding were found in the duodenal       bulb and in the first portion of the duodenum. Coagulation for bleeding       prevention using argon plasma at 0.5 liters/minute and 20 watts was       successful. Impression:               - TRANSFUION DEPENDENT ANEMIA/IRON DEFICIENCY DUE                            TO EROSIVE GASTRITIS/AVMs IN SETTING OF ASA/PLAVIX                            USE                           - A single non-bleeding angioectasia in the                            stomach. Treated with argon plasma coagulation                             (APC).                           - Non-bleeding erosive gastropathy.                           - Four non-bleeding angioectasias in the duodenum.                            Treated with argon plasma coagulation (APC). Moderate Sedation:      Moderate (conscious) sedation was administered by the endoscopy nurse       and supervised by the endoscopist. The following parameters were       monitored: oxygen saturation, heart rate, blood pressure, and response       to care. Total physician intraservice time was 15 minutes. Recommendation:           - High fiber diet and low fat diet. ASA/PLAVIX OK                            DUE TO CAD.                           - Continue present medications. PROTONIX DAILY.                            CONTINUE TO FOLLOW WITH HEMATOLOGY.                           - Return to my office in 4 months.                           -  Patient has a contact number available for                            emergencies. The signs and symptoms of potential                            delayed complications were discussed with the                            patient. Return to normal activities tomorrow.                            Written discharge instructions were provided to the                            patient. Procedure Code(s):        --- Professional ---                           [phone], Esophagogastroduodenoscopy, flexible,                            transoral; with control of bleeding, any method                           99152, Moderate sedation services provided by the                            same physician or other qualified health care                            professional performing the diagnostic or                            therapeutic service that the sedation supports,                            requiring the presence of an independent trained                            observer to assist in the monitoring of the                             patient's level of consciousness and physiological                            status; initial 15 minutes of intraservice time,                            patient age 102 years or older Diagnosis Code(s):        --- Professional ---                           K31.819, Angiodysplasia of stomach and duodenum  without bleeding                           K31.89, Other diseases of stomach and duodenum                           D50.9, Iron deficiency anemia, unspecified                           K28.9, Gastrojejunal ulcer, unspecified as acute or                            chronic, without hemorrhage or perforation CPT copyright 2016 American Medical Association. All rights reserved. The codes documented in this report are preliminary and upon coder review may  be revised to meet current compliance requirements. Barney Drain, MD Barney Drain MD, MD 08/03/2017 10:00:16 AM This report has been signed electronically. Number of Addenda: 0

## 2017-08-09 ENCOUNTER — Encounter (HOSPITAL_BASED_OUTPATIENT_CLINIC_OR_DEPARTMENT_OTHER): Payer: BLUE CROSS/BLUE SHIELD

## 2017-08-09 ENCOUNTER — Encounter (HOSPITAL_COMMUNITY): Payer: Self-pay

## 2017-08-09 ENCOUNTER — Encounter: Payer: Self-pay | Admitting: Cardiology

## 2017-08-09 VITALS — BP 177/71 | HR 108 | Temp 98.8°F | Resp 20

## 2017-08-09 DIAGNOSIS — D508 Other iron deficiency anemias: Secondary | ICD-10-CM | POA: Diagnosis not present

## 2017-08-09 MED ORDER — SODIUM CHLORIDE 0.9 % IV SOLN
Freq: Once | INTRAVENOUS | Status: AC
  Administered 2017-08-09: 14:00:00 via INTRAVENOUS

## 2017-08-09 MED ORDER — SODIUM CHLORIDE 0.9 % IV SOLN
510.0000 mg | Freq: Once | INTRAVENOUS | Status: AC
  Administered 2017-08-09: 510 mg via INTRAVENOUS
  Filled 2017-08-09: qty 17

## 2017-08-09 NOTE — Progress Notes (Signed)
Tolerated infusion w/o adverse reaction.  Alert, in no distress.  VSS.  Discharged ambulatory.  

## 2017-08-09 NOTE — Progress Notes (Signed)
Cardiology Office Note  Date: 08/10/2017   ID: Tammy Mcpherson, DOB 12/01/1955, MRN [id number]  PCP: Tammy Everts, MD  Primary Cardiologist: Tammy Lesches, MD   Chief Complaint  Patient presents with  . Preoperative cardiac evaluation    History of Present Illness: Tammy Mcpherson is a 61 y.o. adult last seen in the office by Tammy Mcpherson in September 2017.  She has a history of suspected nonischemic cardiomyopathy with last assessment of LVEF 40-45% range as of 2017.  She has not maintained regular cardiology follow-up.  She is referred to the office by Tammy Mcpherson for preoperative cardiac evaluation prior to anticipated laparoscopic cholecystectomy.  She presents today reporting no progressive shortness of breath, palpitations, or chest pain.  She has some shortness of breath in the mornings when she gets up, improves with inhalers.  She states that she has been compliant with her medications.  Current cardiac regimen includes Zebeta and Cozaar.  She is also on antiplatelet regimen including aspirin and Plavix with history of PAD.  She follows with Oncology, history of chronic leukocytosis and iron deficiency anemia.  She has not had follow-up cardiac structural testing since last year.  Past Medical History:  Diagnosis Date  . Anemia 04/30/2016  . Cardiomyopathy [facility])    Diagnosed July 2017  . Coronary artery calcification seen on CT scan   . Essential hypertension   . GERD (gastroesophageal reflux disease)   . History of bronchitis   . History of GI bleed   . Hyperlipidemia   . Iron deficiency anemia 05/12/2016   Bleeding ulcers  . Pollen allergy   . PSVT (paroxysmal supraventricular tachycardia) ([place])   . PVC's (premature ventricular contractions)     Past Surgical History:  Procedure Laterality Date  . ABDOMINAL HYSTERECTOMY     polyps  about age 51  . IR ANGIOGRAM EXTREMITY LEFT  04/24/2017  . IR FEM POP ART ATHERECT INC PTA MOD SED  04/24/2017  . IR  INFUSION THROMBOL ARTERIAL INITIAL (MS)  04/24/2017  . IR RADIOLOGIST EVAL & MGMT  12/05/2016  . IR US GUIDE VASC ACCESS RIGHT  04/24/2017    Current Outpatient Medications  Medication Sig Dispense Refill  . acetaminophen (TYLENOL) 500 MG tablet Take 500 mg by mouth at bedtime as needed for moderate pain.    Marland Kitchen albuterol (PROAIR HFA) 108 (90 Base) MCG/ACT inhaler INHALE 2 PUFFS INTO THE LUNGS EVERY 6 HOURS AS NEEDED FOR WHEEZING OR SHORTNESS OF BREATH. (Patient taking differently: Inhale 1-2 puffs into the lungs every 6 (six) hours as needed for wheezing or shortness of breath. ) 18 g 3  . aspirin 81 MG chewable tablet Chew 81 mg by mouth daily.    Marland Kitchen atorvastatin (LIPITOR) 20 MG tablet Take 1 tablet (20 mg total) by mouth daily. 90 tablet 3  . bisoprolol (ZEBETA) 10 MG tablet Take 1 tablet (10 mg total) by mouth daily. 90 tablet 3  . cholecalciferol (VITAMIN D) 1000 units tablet Take 1,000 Units by mouth daily.     . clopidogrel (PLAVIX) 75 MG tablet Take 75 mg by mouth at bedtime.     . furosemide (LASIX) 20 MG tablet Take 1 tablet (20 mg total) by mouth daily. (Patient taking differently: Take 20 mg by mouth daily as needed for fluid. ) 30 tablet 6  . losartan (COZAAR) 100 MG tablet Take 1 tablet (100 mg total) by mouth daily. 90 tablet 3  . Multiple Vitamin (MULTIVITAMIN WITH MINERALS)  TABS tablet Take 1 tablet by mouth daily.    . pantoprazole (PROTONIX) 40 MG tablet Take 1 tablet (40 mg total) by mouth daily. 90 tablet 3   No current facility-administered medications for this visit.    Allergies:  Bee venom; Codeine; Penicillins; and Sudafed [pseudoephedrine hcl]   Social History: The patient  reports that she has been smoking cigarettes.  She started smoking about 42 years ago. She has a 9.25 pack-year smoking history. she has never used smokeless tobacco. She reports that she does not drink alcohol or use drugs.   Family History: The patient's family history includes Coronary artery  disease in her sister; Hypertension in her father; Ulcers in her maternal grandmother. She was adopted.   ROS:  Please see the history of present illness. Otherwise, complete review of systems is positive for intermittent abdominal pain.  All other systems are reviewed and negative.   Physical Exam: VS:  BP (!) 158/66   Pulse 93   Ht 5\' 4"  (1.626 m)   Wt 157 lb (71.2 kg)   SpO2 97%   BMI 26.95 kg/m , BMI Body mass index is 26.95 kg/m.  Wt Readings from Last 3 Encounters:  08/10/17 157 lb (71.2 kg)  08/03/17 154 lb (69.9 kg)  07/25/17 157 lb (71.2 kg)    General: Chronically ill-appearing woman in no distress. HEENT: Conjunctiva and lids normal, oropharynx clear with poor dentition. Neck: Supple, no elevated JVP or carotid bruits, no thyromegaly. Lungs: Decreased breath sounds without wheezing, nonlabored breathing at rest. Cardiac: Regular rate and rhythm, no S3, soft systolic murmur, no pericardial rub. Abdomen: Soft, nontender, bowel sounds present, no guarding or rebound. Extremities: No pitting edema, distal pulses diminished. Skin: Warm and dry. Musculoskeletal: No kyphosis. Neuropsychiatric: Alert and oriented x3, affect grossly appropriate.  ECG: I personally reviewed the tracing from 05/08/2017 which showed sinus rhythm with borderline low voltage.  Recent Labwork: 03/30/2017: ALT 14; AST 13; Magnesium 2.3 05/08/2017: BUN 11; Creatinine, Ser 1.04; Potassium 4.3; Sodium 139 07/23/2017: Hemoglobin 9.7; Platelets 579     Component Value Date/Time   CHOL 206 (H) 03/30/2017 1520   TRIG 421 (H) 03/30/2017 1520   HDL 28 (L) 03/30/2017 1520   CHOLHDL 7.4 (H) 03/30/2017 1520   VLDL NOT CALC 03/30/2017 1520   LDLCALC NOT CALC 03/30/2017 1520    Other Studies Reviewed Today:  Echocardiogram 05/01/2016: Study Conclusions  - Left ventricle: The cavity size was moderately dilated. Wall   thickness was increased in a pattern of mild LVH. Systolic   function was mildly to  moderately reduced. The estimated ejection   fraction was in the range of 40% to 45%. Diffuse hypokinesis. The   study is not technically sufficient to allow evaluation of LV   diastolic function. Doppler parameters are consistent with both   elevated ventricular end-diastolic filling pressure and elevated   left atrial filling pressure. - Mitral valve: Mildly thickened leaflets . There was trivial   regurgitation. - Left atrium: The atrium was moderately dilated. - Right atrium: Central venous pressure (est): 3 mm Hg. - Tricuspid valve: There was trivial regurgitation. - Pulmonary arteries: PA peak pressure: 26 mm Hg (S). - Pericardium, extracardiac: There was no pericardial effusion.  Impressions:  - Mild LVH with moderate LV chamber dilatation and LVEF   approximately 40-45%. There is diffuse hypokinesis overall with   indeterminate diastolic function but evidence of increased LV   filling pressure. Moderate left atrial enlargement. Mildly   thickened mitral  leaflets with trivial mitral regurgitation.   Trivial tricuspid regurgitation with PASP 26 mmHg.  Lexiscan Myoview 05/19/2016:  Upsloping ST segment depression ST segment depression was noted during stress in the II, III, aVF, V4, V5 and V6 leads. Overall nonspecific EKG changes.  The left ventricular ejection fraction is severely decreased (<30%).  The study is normal. There are no perfusion defects consistent with prior infarct or current ischemia  This is a high risk study. High risk based on decreased LVEF. There is no myocardium currently at jeopardy.  Assessment and Plan:  1.  Preoperative evaluation in a 61 year old woman with history of PAD, hypertension, PSVT, nonischemic cardiomyopathy and coronary artery calcifications by CT imaging.  LVEF was 40-45% by echocardiogram last year with diffuse hypokinesis.  She is being considered for a laparoscopic cholecystectomy.  Myoview study showed no ischemia at that time  with under calculated LVEF.  She reports relative stability in terms of chronic dyspnea on exertion on current cardiac regimen including bisoprolol and Cozaar.  We will update her echocardiogram to reassess LVEF.  Doubt that further ischemic workup will be needed at this point.  2.  Essential hypertension, continue on current regimen and keep follow-up with PCP.  3.  Mixed hyperlipidemia, she continues on Lipitor.  4.  Tobacco abuse.  Smoking cessation has been discussed.  Current medicines were reviewed with the patient today.   Orders Placed This Encounter  Procedures  . ECHOCARDIOGRAM COMPLETE    Disposition:  Signed, Satira Sark, MD, [facility] 08/10/2017 2:12 PM     [facility] [address] [place], [place] [address] Phone: [phone]; Fax: [phone]

## 2017-08-10 ENCOUNTER — Encounter: Payer: Self-pay | Admitting: Cardiology

## 2017-08-10 ENCOUNTER — Ambulatory Visit: Payer: BLUE CROSS/BLUE SHIELD | Admitting: Cardiology

## 2017-08-10 VITALS — BP 158/66 | HR 93 | Ht 64.0 in | Wt 157.0 lb

## 2017-08-10 DIAGNOSIS — Z720 Tobacco use: Secondary | ICD-10-CM | POA: Diagnosis not present

## 2017-08-10 DIAGNOSIS — I428 Other cardiomyopathies: Secondary | ICD-10-CM | POA: Diagnosis not present

## 2017-08-10 DIAGNOSIS — Z01810 Encounter for preprocedural cardiovascular examination: Secondary | ICD-10-CM | POA: Diagnosis not present

## 2017-08-10 DIAGNOSIS — I10 Essential (primary) hypertension: Secondary | ICD-10-CM | POA: Diagnosis not present

## 2017-08-10 DIAGNOSIS — E782 Mixed hyperlipidemia: Secondary | ICD-10-CM

## 2017-08-10 NOTE — Patient Instructions (Addendum)
Your physician wants you to follow-up in: 6 months with Dr McDowell You will receive a reminder letter in the mail two months in advance. If you don't receive a letter, please call our office to schedule the follow-up appointment.   Your physician has requested that you have an echocardiogram. Echocardiography is a painless test that uses sound waves to create images of your heart. It provides your doctor with information about the size and shape of your heart and how well your heart's chambers and valves are working. This procedure takes approximately one hour. There are no restrictions for this procedure.    Your physician recommends that you continue on your current medications as directed. Please refer to the Current Medication list given to you today.    If you need a refill on your cardiac medications before your next appointment, please call your pharmacy.     No lab work ordered today     Thank you for choosing [facility] !        

## 2017-08-13 ENCOUNTER — Ambulatory Visit (HOSPITAL_COMMUNITY)
Admission: RE | Admit: 2017-08-13 | Discharge: 2017-08-13 | Disposition: A | Discharge status: Home / Self Care | Payer: BLUE CROSS/BLUE SHIELD | Source: Ambulatory Visit | Attending: Cardiology | Admitting: Cardiology

## 2017-08-13 DIAGNOSIS — I428 Other cardiomyopathies: Secondary | ICD-10-CM | POA: Diagnosis not present

## 2017-08-13 DIAGNOSIS — E785 Hyperlipidemia, unspecified: Secondary | ICD-10-CM | POA: Diagnosis not present

## 2017-08-13 DIAGNOSIS — J449 Chronic obstructive pulmonary disease, unspecified: Secondary | ICD-10-CM | POA: Insufficient documentation

## 2017-08-13 DIAGNOSIS — I119 Hypertensive heart disease without heart failure: Secondary | ICD-10-CM | POA: Insufficient documentation

## 2017-08-13 DIAGNOSIS — F17210 Nicotine dependence, cigarettes, uncomplicated: Secondary | ICD-10-CM | POA: Insufficient documentation

## 2017-08-13 NOTE — Progress Notes (Signed)
*  PRELIMINARY RESULTS* Echocardiogram 2D Echocardiogram has been performed.  Leavy Cella 08/13/2017, 1:04 PM

## 2017-08-16 NOTE — H&P (Addendum)
[facility] History and Physical  Reason for Referral: Gallbladder Mass  Referring Physician:  Roseanne Kaufman, [facility]    EULALA Tammy Mcpherson is a 61 y.o. female.  HPI: Tammy Mcpherson is a 61 yo with multiple medical issues including CHF, PVD, toe gangrene, COPD, PUD and AVMs and anemia thought to be related to this who required blood this past Friday.  She was seen in the Gastroenterologist office to get surveillance of these issues given her continued anemia, and they noted the finding of a gallbladder mass on her CT scan that was obtained 06/2016 by the hematologist who was seeing her for chronic leukocytosis and thrombocytosis thought to be related to steroids.  She has not undergone any repeat imaging specifically for this finding, but did have a CT angiogram 04/2017 where they comment on the gallbladder mass being present and similar to 06/2016.  She denies any abdominal pain in the RUQ or nausea/bloating with food. She reports she does have reflux and some epigastric pain occasionally but she feels this is related to her ulcer issues.  She denies any fevers, chills or weight loss. She is chronically anemic, but denies any dark or tarry stools or BRBRP.        Past Medical History:  Diagnosis Date  . Allergy    pollen  . Anemia 04/30/2016  . Cardiomyopathy [facility])    Diagnosed July 2017  . Combined congestive systolic and diastolic heart failure ([place])   . COPD (chronic obstructive pulmonary disease) ([place])    a. suspected COPD.  Marland Kitchen Coronary artery calcification seen on CT scan   . Essential hypertension   . GERD (gastroesophageal reflux disease)   . History of bronchitis   . Hyperlipidemia   . Iron deficiency anemia 05/12/2016   bleeding ulcers  . PSVT (paroxysmal supraventricular tachycardia) ([place])    a. first noted on tele during adm 7-05/2016.  Marland Kitchen PVC's (premature ventricular contractions)    a. first noted on tele during adm  7-05/2016.  . Substance abuse ([place])   . Thrombocytosis ([place])   . Ulcer          Past Surgical History:  Procedure Laterality Date  . ABDOMINAL HYSTERECTOMY     polyps  about age 29  . Barbie Banner OSTEOTOMY Right 07/10/2013   Procedure: Barbie Banner OSTEOTOMY RIGHT FOOT;  Surgeon: Marcheta Grammes, DPM;  Location: [facility];  Service: Orthopedics;  Laterality: Right;  . AMPUTATION Left 05/09/2017   Procedure: AMPUTATION 5TH TOE LEFT FOOT;  Surgeon: [name], DPM;  Location: [facility];  Service: Podiatry;  Laterality: Left;  . AMPUTATION Left 06/13/2017   Procedure: AMPUTATION 2ND TOE LEFT FOOT;  Surgeon: [name], DPM;  Location: [facility];  Service: Podiatry;  Laterality: Left;  . BUNIONECTOMY Right 07/10/2013   Procedure: VOGLER BUNIONECTOMY RIGHT FOOT;  Surgeon: Marcheta Grammes, DPM;  Location: [facility];  Service: Orthopedics;  Laterality: Right;  . COLONOSCOPY N/A 07/07/2016   Dr. Oneida Alar; redundant left colon, diverticulosis at hepatic flexure, non-bleeding internal hemorrhoids  . ESOPHAGOGASTRODUODENOSCOPY N/A 07/07/2016   Dr. Oneida Alar: many non-bleeding cratered gastric ulcers without stigmata of bleeding in gastric antrum. four 2-3 mm angioectasias without bleeding in duodenal bulb and second portion of duodenum s/p APC. Chroni gastritis on path.   . IR ANGIOGRAM EXTREMITY LEFT  04/24/2017  . IR FEM POP ART ATHERECT INC PTA MOD SED  04/24/2017  . IR INFUSION THROMBOL ARTERIAL INITIAL (MS)  04/24/2017  . IR RADIOLOGIST EVAL & MGMT  12/05/2016  . IR US GUIDE VASC ACCESS RIGHT  04/24/2017  . METATARSAL HEAD EXCISION Right 07/10/2013   Procedure: METATARSAL HEAD RESECTION OF DIGITS 2 AND 3 RIGHT FOOT;  Surgeon: Marcheta Grammes, DPM;  Location: [facility];  Service: Orthopedics;  Laterality: Right;  . PROXIMAL INTERPHALANGEAL FUSION (PIP) Right 07/10/2013   Procedure: ARTHRODESIS PIPJ  2ND DIGIT RIGHT FOOT;  Surgeon: Marcheta Grammes, DPM;  Location: [facility];   Service: Orthopedics;  Laterality: Right;         Family History  Problem Relation Age of Onset  . Adopted: Yes  . Hypertension Father   . Coronary artery disease Sister   . Ulcers Maternal Grandmother   . Colon cancer Neg Hx        unknown, was adopted          Social History  Substance Use Topics  . Smoking status: Current Every Day Smoker    Packs/day: 0.25    Years: 37.00    Types: Cigarettes    Start date: 05/10/1975  . Smokeless tobacco: Never Used     Comment: on nicotrol inhalers   . Alcohol use No    Medications: I have reviewed the patient's current medications.     Allergies as of 07/24/2017      Reactions   Bee Venom Swelling   Codeine Anaphylaxis   Tongue swelled   Penicillins Swelling, Rash   Has patient had a PCN reaction causing immediate rash, facial/tongue/throat swelling, SOB or lightheadedness with hypotension: yes Has patient had a PCN reaction causing severe rash involving mucus membranes or skin necrosis: No Has patient had a PCN reaction that required hospitalization No Has patient had a PCN reaction occurring within the last 10 years: No If all of the above answers are "NO", then may proceed with Cephalosporin use.   Sudafed [pseudoephedrine Hcl] Rash                  Medication List            Accurate as of 07/24/17  4:50 PM. Always use your most recent med list.           acetaminophen 500 MG tablet Commonly known as:  TYLENOL Take 500 mg by mouth at bedtime as needed for moderate pain.   albuterol 108 (90 Base) MCG/ACT inhaler Commonly known as:  PROAIR HFA INHALE 2 PUFFS INTO THE LUNGS EVERY 6 HOURS AS NEEDED FOR WHEEZING OR SHORTNESS OF BREATH.   aspirin 81 MG chewable tablet Chew 81 mg by mouth daily.   atorvastatin 20 MG tablet Commonly known as:  LIPITOR Take 1 tablet (20 mg total) by mouth daily.   bisoprolol 10 MG tablet Commonly known as:  ZEBETA Take 1 tablet (10 mg  total) by mouth daily.   cholecalciferol 1000 units tablet Commonly known as:  VITAMIN D Take 1,000 Units by mouth daily.   clopidogrel 75 MG tablet Commonly known as:  PLAVIX Take 75 mg by mouth at bedtime.   furosemide 20 MG tablet Commonly known as:  LASIX Take 1 tablet (20 mg total) by mouth daily.   losartan 100 MG tablet Commonly known as:  COZAAR Take 1 tablet (100 mg total) by mouth daily.   multivitamin with minerals Tabs tablet Take 1 tablet by mouth daily.   pantoprazole 40 MG tablet Commonly known as:  PROTONIX Take 1 tablet (40 mg total) by mouth daily.        ROS:  A  comprehensive review of systems was negative except for: Gastrointestinal: positive for reflux symptoms Hematologic/lymphatic: positive for anemia  Blood pressure (!) 168/71, pulse 86, temperature 98.2 F (36.8 C), resp. rate 18, height 5\' 5"  (1.651 m), weight 157 lb (71.2 kg). Physical Exam  Constitutional: She is oriented to person, place, and time and well-developed, well-nourished, and in no distress.  HENT:  Head: Normocephalic.  Eyes: Pupils are equal, round, and reactive to light.  Cardiovascular: Normal rate and regular rhythm.   Pulmonary/Chest: Effort normal and breath sounds normal.  Abdominal: Soft. She exhibits no distension and no mass. There is no tenderness.  Musculoskeletal: Normal range of motion. She exhibits no edema.  Neurological: She is alert and oriented to person, place, and time.  Skin: Skin is warm and dry.  Psychiatric: Mood, memory, affect and judgment normal.  Vitals reviewed.   Results:      Results for orders placed or performed in visit on 07/19/17 (from the past 48 hour(s))  CBC with Differential     Status: Abnormal   Collection Time: 07/23/17 11:36 AM  Result Value Ref Range   WBC 11.4 (H) 3.8 - 10.8 Thousand/uL   RBC 4.51 3.80 - 5.10 Million/uL   Hemoglobin 9.7 (L) 11.7 - 15.5 g/dL   HCT 32.6 (L) 35.0 - 45.0 %   MCV 72.3 (L)  80.0 - 100.0 fL   MCH 21.5 (L) 27.0 - 33.0 pg   MCHC 29.8 (L) 32.0 - 36.0 g/dL   RDW 18.2 (H) 11.0 - 15.0 %   Platelets 579 (H) 140 - 400 Thousand/uL   MPV 10.2 7.5 - 12.5 fL   Neutro Abs 7,615 1,500 - 7,800 cells/uL   Lymphs Abs 2,314 850 - 3,900 cells/uL   WBC mixed population 1,231 (H) 200 - 950 cells/uL   Eosinophils Absolute 182 15 - 500 cells/uL   Basophils Absolute 57 0 - 200 cells/uL   Neutrophils Relative % 66.8 %   Total Lymphocyte 20.3 %   Monocytes Relative 10.8 %   Eosinophils Relative 1.6 %   Basophils Relative 0.5 %    CT 06/2016 reviewed- fundus of gallbladder with some contraction/ calcification, could be mass CT 04/2017- similar in appearance    Assessment & Plan:  LUBNA STEGEMAN is a 61 y.o. female with concern for a gallbladder mass from CT 06/2016. She has not undergone a dedicated US to evaluate this mass or to see if this is stones/ polyp.   -[place] RUQ to assess this further  -Will follow up after the [place]  -Given results of the [place], will discuss plan/ need for surgery and what that would require  Future Appointments Date Time Provider [place]  07/25/2017 3:20 PM [facility] COVERING PROVIDER [facility] None  07/30/2017 9:30 AM [facility] 5 [facility] [place] [facility]  09/12/2017 1:30 PM Annitta Needs, NP [facility]  10/04/2017 9:40 AM Raylene Everts, MD [facility] R[facility]     All questions were answered to the satisfaction of the patient and family. Discussed that gallbladder masses can be polyps versus cancer, and that we would have more information after the [place] was performed.     Virl Cagey 07/24/2017, 4:50 PM   Cardiology risk stratification performed. ECHO repeated, Dr. Domenic Polite reports " Results reviewed. LVEF is now normal range at 60-65%, shows significant improvement compared to last study. She should be able to proceed with planned laparoscopic cholecystectomy at an acceptable perioperative cardiac risk." [place] ordered and  demonstrates -  IMPRESSION: Chronically abnormal appearance of the gallbladder fundus with echogenic shadowing material present which may reflect inspissated bile or sludge. Malignancy cannot be excluded based on this study but given the lack of significant change since the CT scan of September 2017 it is felt less likely.  No typical gallstones. No sonographic evidence of acute cholecystitis.  Mildly increased hepatic echotexture most compatible with fatty infiltrative change.  Electronically Signed   By: David  Martinique M.D.   On: 07/30/2017 09:56  Given the [place] results, unlikely to be a malignancy. Will proceed with Lap chole as planned.   Curlene Labrum, MD [facility] [address] [place], [place] [address] [phone] (office)

## 2017-08-17 NOTE — Patient Instructions (Addendum)
Tammy Mcpherson  08/17/2017     @PREFPERIOPPHARMACY @   Your procedure is scheduled on  08/22/2017 .  Report to [facility] at  725   A.M.  Call this number if you have problems the morning of surgery:  [phone]   Remember:  Do not eat food or drink liquids after midnight.  Take these medicines the morning of surgery with A SIP OF WATER  Bisoprolol, cozaar, protonix.   Do not wear jewelry, make-up or nail polish.  Do not wear lotions, powders, or perfumes, or deoderant.  Do not shave 48 hours prior to surgery.  Men may shave face and neck.  Do not bring valuables to the hospital.  [facility] is not responsible for any belongings or valuables.  Contacts, dentures or bridgework may not be worn into surgery.  Leave your suitcase in the car.  After surgery it may be brought to your room.  For patients admitted to the hospital, discharge time will be determined by your treatment team.  Patients discharged the day of surgery will not be allowed to drive home.   Name and phone number of your driver:   family Special instructions:  None  Please read over the following fact sheets that you were given. Anesthesia Post-op Instructions and Care and Recovery After Surgery       Laparoscopic Cholecystectomy Laparoscopic cholecystectomy is surgery to remove the gallbladder. The gallbladder is a pear-shaped organ that lies beneath the liver on the right side of the body. The gallbladder stores bile, which is a fluid that helps the body to digest fats. Cholecystectomy is often done for inflammation of the gallbladder (cholecystitis). This condition is usually caused by a buildup of gallstones (cholelithiasis) in the gallbladder. Gallstones can block the flow of bile, which can result in inflammation and pain. In severe cases, emergency surgery may be required. This procedure is done though small incisions in your abdomen (laparoscopic surgery). A thin scope with a camera  (laparoscope) is inserted through one incision. Thin surgical instruments are inserted through the other incisions. In some cases, a laparoscopic procedure may be turned into a type of surgery that is done through a larger incision (open surgery). Tell a health care provider about:  Any allergies you have.  All medicines you are taking, including vitamins, herbs, eye drops, creams, and over-the-counter medicines.  Any problems you or family members have had with anesthetic medicines.  Any blood disorders you have.  Any surgeries you have had.  Any medical conditions you have.  Whether you are pregnant or may be pregnant. What are the risks? Generally, this is a safe procedure. However, problems may occur, including:  Infection.  Bleeding.  Allergic reactions to medicines.  Damage to other structures or organs.  A stone remaining in the common bile duct. The common bile duct carries bile from the gallbladder into the small intestine.  A bile leak from the cyst duct that is clipped when your gallbladder is removed.  What happens before the procedure? Staying hydrated Follow instructions from your health care provider about hydration, which may include:  Up to 2 hours before the procedure - you may continue to drink clear liquids, such as water, clear fruit juice, black coffee, and plain tea.  Eating and drinking restrictions Follow instructions from your health care provider about eating and drinking, which may include:  8 hours before the procedure - stop eating heavy meals or foods  such as meat, fried foods, or fatty foods.  6 hours before the procedure - stop eating light meals or foods, such as toast or cereal.  6 hours before the procedure - stop drinking milk or drinks that contain milk.  2 hours before the procedure - stop drinking clear liquids.  Medicines  Ask your health care provider about: ? Changing or stopping your regular medicines. This is especially  important if you are taking diabetes medicines or blood thinners. ? Taking medicines such as aspirin and ibuprofen. These medicines can thin your blood. Do not take these medicines before your procedure if your health care provider instructs you not to.  You may be given antibiotic medicine to help prevent infection. General instructions  Let your health care provider know if you develop a cold or an infection before surgery.  Plan to have someone take you home from the hospital or clinic.  Ask your health care provider how your surgical site will be marked or identified. What happens during the procedure?  To reduce your risk of infection: ? Your health care team will wash or sanitize their hands. ? Your skin will be washed with soap. ? Hair may be removed from the surgical area.  An IV tube may be inserted into one of your veins.  You will be given one or more of the following: ? A medicine to help you relax (sedative). ? A medicine to make you fall asleep (general anesthetic).  A breathing tube will be placed in your mouth.  Your surgeon will make several small cuts (incisions) in your abdomen.  The laparoscope will be inserted through one of the small incisions. The camera on the laparoscope will send images to a TV screen (monitor) in the operating room. This lets your surgeon see inside your abdomen.  Air-like gas will be pumped into your abdomen. This will expand your abdomen to give the surgeon more room to perform the surgery.  Other tools that are needed for the procedure will be inserted through the other incisions. The gallbladder will be removed through one of the incisions.  Your common bile duct may be examined. If stones are found in the common bile duct, they may be removed.  After your gallbladder has been removed, the incisions will be closed with stitches (sutures), staples, or skin glue.  Your incisions may be covered with a bandage (dressing). The  procedure may vary among health care providers and hospitals. What happens after the procedure?  Your blood pressure, heart rate, breathing rate, and blood oxygen level will be monitored until the medicines you were given have worn off.  You will be given medicines as needed to control your pain.  Do not drive for 24 hours if you were given a sedative. This information is not intended to replace advice given to you by your health care provider. Make sure you discuss any questions you have with your health care provider. Document Released: 09/18/2005 Document Revised: 04/09/2016 Document Reviewed: 03/06/2016 Elsevier Interactive Patient Education  2018 [employer].  Laparoscopic Cholecystectomy, Care After This sheet gives you information about how to care for yourself after your procedure. Your health care provider may also give you more specific instructions. If you have problems or questions, contact your health care provider. What can I expect after the procedure? After the procedure, it is common to have:  Pain at your incision sites. You will be given medicines to control this pain.  Mild nausea or vomiting.  Bloating  and possible shoulder pain from the air-like gas that was used during the procedure.  Follow these instructions at home: Incision care   Follow instructions from your health care provider about how to take care of your incisions. Make sure you: ? Wash your hands with soap and water before you change your bandage (dressing). If soap and water are not available, use hand sanitizer. ? Change your dressing as told by your health care provider. ? Leave stitches (sutures), skin glue, or adhesive strips in place. These skin closures may need to be in place for 2 weeks or longer. If adhesive strip edges start to loosen and curl up, you may trim the loose edges. Do not remove adhesive strips completely unless your health care provider tells you to do that.  Do not take  baths, swim, or use a hot tub until your health care provider approves. Ask your health care provider if you can take showers. You may only be allowed to take sponge baths for bathing.  Check your incision area every day for signs of infection. Check for: ? More redness, swelling, or pain. ? More fluid or blood. ? Warmth. ? Pus or a bad smell. Activity  Do not drive or use heavy machinery while taking prescription pain medicine.  Do not lift anything that is heavier than 10 lb (4.5 kg) until your health care provider approves.  Do not play contact sports until your health care provider approves.  Do not drive for 24 hours if you were given a medicine to help you relax (sedative).  Rest as needed. Do not return to work or school until your health care provider approves. General instructions  Take over-the-counter and prescription medicines only as told by your health care provider.  To prevent or treat constipation while you are taking prescription pain medicine, your health care provider may recommend that you: ? Drink enough fluid to keep your urine clear or pale yellow. ? Take over-the-counter or prescription medicines. ? Eat foods that are high in fiber, such as fresh fruits and vegetables, whole grains, and beans. ? Limit foods that are high in fat and processed sugars, such as fried and sweet foods. Contact a health care provider if:  You develop a rash.  You have more redness, swelling, or pain around your incisions.  You have more fluid or blood coming from your incisions.  Your incisions feel warm to the touch.  You have pus or a bad smell coming from your incisions.  You have a fever.  One or more of your incisions breaks open. Get help right away if:  You have trouble breathing.  You have chest pain.  You have increasing pain in your shoulders.  You faint or feel dizzy when you stand.  You have severe pain in your abdomen.  You have nausea or vomiting  that lasts for more than one day.  You have leg pain. This information is not intended to replace advice given to you by your health care provider. Make sure you discuss any questions you have with your health care provider. Document Released: 09/18/2005 Document Revised: 04/08/2016 Document Reviewed: 03/06/2016 Elsevier Interactive Patient Education  2017 [place] Anesthesia, Adult General anesthesia is the use of medicines to make a person "go to sleep" (be unconscious) for a medical procedure. General anesthesia is often recommended when a procedure:  Is long.  Requires you to be still or in an unusual position.  Is major and can cause you  to lose blood.  Is impossible to do without general anesthesia.  The medicines used for general anesthesia are called general anesthetics. In addition to making you sleep, the medicines:  Prevent pain.  Control your blood pressure.  Relax your muscles.  Tell a health care provider about:  Any allergies you have.  All medicines you are taking, including vitamins, herbs, eye drops, creams, and over-the-counter medicines.  Any problems you or family members have had with anesthetic medicines.  Types of anesthetics you have had in the past.  Any bleeding disorders you have.  Any surgeries you have had.  Any medical conditions you have.  Any history of heart or lung conditions, such as heart failure, sleep apnea, or chronic obstructive pulmonary disease (COPD).  Whether you are pregnant or may be pregnant.  Whether you use tobacco, alcohol, marijuana, or street drugs.  Any history of Armed forces logistics/support/administrative officer.  Any history of depression or anxiety. What are the risks? Generally, this is a safe procedure. However, problems may occur, including:  Allergic reaction to anesthetics.  Lung and heart problems.  Inhaling food or liquids from your stomach into your lungs (aspiration).  Injury to nerves.  Waking up during  your procedure and being unable to move (rare).  Extreme agitation or a state of mental confusion (delirium) when you wake up from the anesthetic.  Air in the bloodstream, which can lead to stroke.  These problems are more likely to develop if you are having a major surgery or if you have an advanced medical condition. You can prevent some of these complications by answering all of your health care provider's questions thoroughly and by following all pre-procedure instructions. General anesthesia can cause side effects, including:  Nausea or vomiting  A sore throat from the breathing tube.  Feeling cold or shivery.  Feeling tired, washed out, or achy.  Sleepiness or drowsiness.  Confusion or agitation.  What happens before the procedure? Staying hydrated Follow instructions from your health care provider about hydration, which may include:  Up to 2 hours before the procedure - you may continue to drink clear liquids, such as water, clear fruit juice, black coffee, and plain tea.  Eating and drinking restrictions Follow instructions from your health care provider about eating and drinking, which may include:  8 hours before the procedure - stop eating heavy meals or foods such as meat, fried foods, or fatty foods.  6 hours before the procedure - stop eating light meals or foods, such as toast or cereal.  6 hours before the procedure - stop drinking milk or drinks that contain milk.  2 hours before the procedure - stop drinking clear liquids.  Medicines  Ask your health care provider about: ? Changing or stopping your regular medicines. This is especially important if you are taking diabetes medicines or blood thinners. ? Taking medicines such as aspirin and ibuprofen. These medicines can thin your blood. Do not take these medicines before your procedure if your health care provider instructs you not to. ? Taking new dietary supplements or medicines. Do not take these during  the week before your procedure unless your health care provider approves them.  If you are told to take a medicine or to continue taking a medicine on the day of the procedure, take the medicine with sips of water. General instructions   Ask if you will be going home the same day, the following day, or after a longer hospital stay. ? Plan to have someone take  you home. ? Plan to have someone stay with you for the first 24 hours after you leave the hospital or clinic.  For 3-6 weeks before the procedure, try not to use any tobacco products, such as cigarettes, chewing tobacco, and e-cigarettes.  You may brush your teeth on the morning of the procedure, but make sure to spit out the toothpaste. What happens during the procedure?  You will be given anesthetics through a mask and through an IV tube in one of your veins.  You may receive medicine to help you relax (sedative).  As soon as you are asleep, a breathing tube may be used to help you breathe.  An anesthesia specialist will stay with you throughout the procedure. He or she will help keep you comfortable and safe by continuing to give you medicines and adjusting the amount of medicine that you get. He or she will also watch your blood pressure, pulse, and oxygen levels to make sure that the anesthetics do not cause any problems.  If a breathing tube was used to help you breathe, it will be removed before you wake up. The procedure may vary among health care providers and hospitals. What happens after the procedure?  You will wake up, often slowly, after the procedure is complete, usually in a recovery area.  Your blood pressure, heart rate, breathing rate, and blood oxygen level will be monitored until the medicines you were given have worn off.  You may be given medicine to help you calm down if you feel anxious or agitated.  If you will be going home the same day, your health care provider may check to make sure you can stand,  drink, and urinate.  Your health care providers will treat your pain and side effects before you go home.  Do not drive for 24 hours if you received a sedative.  You may: ? Feel nauseous and vomit. ? Have a sore throat. ? Have mental slowness. ? Feel cold or shivery. ? Feel sleepy. ? Feel tired. ? Feel sore or achy, even in parts of your body where you did not have surgery. This information is not intended to replace advice given to you by your health care provider. Make sure you discuss any questions you have with your health care provider. Document Released: 12/26/2007 Document Revised: 02/29/2016 Document Reviewed: 09/02/2015 Elsevier Interactive Patient Education  2018 [place] Anesthesia, Adult, Care After These instructions provide you with information about caring for yourself after your procedure. Your health care provider may also give you more specific instructions. Your treatment has been planned according to current medical practices, but problems sometimes occur. Call your health care provider if you have any problems or questions after your procedure. What can I expect after the procedure? After the procedure, it is common to have:  Vomiting.  A sore throat.  Mental slowness.  It is common to feel:  Nauseous.  Cold or shivery.  Sleepy.  Tired.  Sore or achy, even in parts of your body where you did not have surgery.  Follow these instructions at home: For at least 24 hours after the procedure:  Do not: ? Participate in activities where you could fall or become injured. ? Drive. ? Use heavy machinery. ? Drink alcohol. ? Take sleeping pills or medicines that cause drowsiness. ? Make important decisions or sign legal documents. ? Take care of children on your own.  Rest. Eating and drinking  If you vomit, drink water, juice, or  soup when you can drink without vomiting.  Drink enough fluid to keep your urine clear or pale  yellow.  Make sure you have little or no nausea before eating solid foods.  Follow the diet recommended by your health care provider. General instructions  Have a responsible adult stay with you until you are awake and alert.  Return to your normal activities as told by your health care provider. Ask your health care provider what activities are safe for you.  Take over-the-counter and prescription medicines only as told by your health care provider.  If you smoke, do not smoke without supervision.  Keep all follow-up visits as told by your health care provider. This is important. Contact a health care provider if:  You continue to have nausea or vomiting at home, and medicines are not helpful.  You cannot drink fluids or start eating again.  You cannot urinate after 8-12 hours.  You develop a skin rash.  You have fever.  You have increasing redness at the site of your procedure. Get help right away if:  You have difficulty breathing.  You have chest pain.  You have unexpected bleeding.  You feel that you are having a life-threatening or urgent problem. This information is not intended to replace advice given to you by your health care provider. Make sure you discuss any questions you have with your health care provider. Document Released: 12/25/2000 Document Revised: 02/21/2016 Document Reviewed: 09/02/2015 Elsevier Interactive Patient Education  [employer].

## 2017-08-20 ENCOUNTER — Other Ambulatory Visit: Payer: Self-pay

## 2017-08-20 ENCOUNTER — Encounter (HOSPITAL_COMMUNITY): Payer: Self-pay

## 2017-08-20 ENCOUNTER — Encounter (HOSPITAL_COMMUNITY)
Admission: RE | Admit: 2017-08-20 | Discharge: 2017-08-20 | Disposition: A | Discharge status: Home / Self Care | Payer: BLUE CROSS/BLUE SHIELD | Source: Ambulatory Visit | Attending: General Surgery | Admitting: General Surgery

## 2017-08-20 DIAGNOSIS — I5040 Unspecified combined systolic (congestive) and diastolic (congestive) heart failure: Secondary | ICD-10-CM | POA: Diagnosis not present

## 2017-08-20 DIAGNOSIS — K811 Chronic cholecystitis: Secondary | ICD-10-CM | POA: Diagnosis not present

## 2017-08-20 DIAGNOSIS — J449 Chronic obstructive pulmonary disease, unspecified: Secondary | ICD-10-CM | POA: Diagnosis not present

## 2017-08-20 DIAGNOSIS — D649 Anemia, unspecified: Secondary | ICD-10-CM | POA: Diagnosis not present

## 2017-08-20 DIAGNOSIS — F17210 Nicotine dependence, cigarettes, uncomplicated: Secondary | ICD-10-CM | POA: Diagnosis not present

## 2017-08-20 DIAGNOSIS — K219 Gastro-esophageal reflux disease without esophagitis: Secondary | ICD-10-CM | POA: Diagnosis not present

## 2017-08-20 DIAGNOSIS — I110 Hypertensive heart disease with heart failure: Secondary | ICD-10-CM | POA: Diagnosis not present

## 2017-08-20 DIAGNOSIS — I739 Peripheral vascular disease, unspecified: Secondary | ICD-10-CM | POA: Diagnosis not present

## 2017-08-20 DIAGNOSIS — I2510 Atherosclerotic heart disease of native coronary artery without angina pectoris: Secondary | ICD-10-CM | POA: Diagnosis not present

## 2017-08-20 DIAGNOSIS — E785 Hyperlipidemia, unspecified: Secondary | ICD-10-CM | POA: Diagnosis not present

## 2017-08-20 DIAGNOSIS — R1909 Other intra-abdominal and pelvic swelling, mass and lump: Secondary | ICD-10-CM | POA: Diagnosis present

## 2017-08-20 DIAGNOSIS — I429 Cardiomyopathy, unspecified: Secondary | ICD-10-CM | POA: Diagnosis not present

## 2017-08-20 DIAGNOSIS — K828 Other specified diseases of gallbladder: Secondary | ICD-10-CM | POA: Diagnosis not present

## 2017-08-20 LAB — BASIC METABOLIC PANEL
Anion gap: 9 (ref 5–15)
BUN: 8 mg/dL (ref 6–20)
CHLORIDE: 105 mmol/L (ref 101–111)
CO2: 21 mmol/L — AB (ref 22–32)
Calcium: 9 mg/dL (ref 8.9–10.3)
Creatinine, Ser: 0.77 mg/dL (ref 0.44–1.00)
GFR calc Af Amer: >60 mL/min (ref >60)
GFR calc non Af Amer: >60 mL/min (ref >60)
GLUCOSE: 158 mg/dL — AB (ref 65–99)
POTASSIUM: 3.7 mmol/L (ref 3.5–5.1)
SODIUM: 135 mmol/L (ref 135–145)

## 2017-08-20 LAB — CBC
HEMATOCRIT: 37.2 % (ref 36.0–46.0)
HEMOGLOBIN: 11 g/dL — AB (ref 12.0–15.0)
MCH: 25.7 pg — AB (ref 26.0–34.0)
MCHC: 29.6 g/dL — ABNORMAL LOW (ref 30.0–36.0)
MCV: 86.9 fL (ref 78.0–100.0)
Platelets: 476 10*3/uL — ABNORMAL HIGH (ref 150–400)
RBC: 4.28 MIL/uL (ref 3.87–5.11)
RDW: 28.5 % — ABNORMAL HIGH (ref 11.5–15.5)
WBC: 13 10*3/uL — ABNORMAL HIGH (ref 4.0–10.5)

## 2017-08-20 MED ORDER — CHLORHEXIDINE GLUCONATE CLOTH 2 % EX PADS: 6.0000 | MEDICATED_PAD | Freq: Once | CUTANEOUS | Status: DC

## 2017-08-20 MED ORDER — CHLORHEXIDINE GLUCONATE CLOTH 2 % EX PADS
6.0000 | MEDICATED_PAD | Freq: Once | CUTANEOUS | Status: DC
Start: 2017-08-20 — End: 2017-08-21

## 2017-08-20 NOTE — Pre-Procedure Instructions (Signed)
Patient stopped Plavix a week and a half, previous to this visit. States she feels like it is making her dizzy.  Advised her to contact the prescribing physician to make him aware.  Verbalized understanding.

## 2017-08-21 NOTE — Pre-Procedure Instructions (Signed)
Dr Constance Haw made aware of WBC 13 and non compliance with Plavix, as she hasnt taken for over 2 weeks.  No further action needed.

## 2017-08-22 ENCOUNTER — Ambulatory Visit (HOSPITAL_COMMUNITY): Payer: BLUE CROSS/BLUE SHIELD | Admitting: Anesthesiology

## 2017-08-22 ENCOUNTER — Encounter (HOSPITAL_COMMUNITY)
Admission: RE | Disposition: A | Discharge status: Home / Self Care | Payer: Self-pay | Source: Ambulatory Visit | Attending: General Surgery

## 2017-08-22 ENCOUNTER — Encounter (HOSPITAL_COMMUNITY): Payer: Self-pay | Admitting: *Deleted

## 2017-08-22 ENCOUNTER — Ambulatory Visit (HOSPITAL_COMMUNITY)
Admission: RE | Admit: 2017-08-22 | Discharge: 2017-08-22 | Disposition: A | Discharge status: Home / Self Care | Payer: BLUE CROSS/BLUE SHIELD | Source: Ambulatory Visit | Attending: General Surgery | Admitting: General Surgery

## 2017-08-22 DIAGNOSIS — K8020 Calculus of gallbladder without cholecystitis without obstruction: Secondary | ICD-10-CM

## 2017-08-22 DIAGNOSIS — K828 Other specified diseases of gallbladder: Secondary | ICD-10-CM | POA: Diagnosis not present

## 2017-08-22 DIAGNOSIS — E785 Hyperlipidemia, unspecified: Secondary | ICD-10-CM | POA: Insufficient documentation

## 2017-08-22 DIAGNOSIS — K769 Liver disease, unspecified: Secondary | ICD-10-CM | POA: Diagnosis not present

## 2017-08-22 DIAGNOSIS — I739 Peripheral vascular disease, unspecified: Secondary | ICD-10-CM | POA: Insufficient documentation

## 2017-08-22 DIAGNOSIS — D649 Anemia, unspecified: Secondary | ICD-10-CM | POA: Insufficient documentation

## 2017-08-22 DIAGNOSIS — I5040 Unspecified combined systolic (congestive) and diastolic (congestive) heart failure: Secondary | ICD-10-CM | POA: Insufficient documentation

## 2017-08-22 DIAGNOSIS — I2510 Atherosclerotic heart disease of native coronary artery without angina pectoris: Secondary | ICD-10-CM | POA: Insufficient documentation

## 2017-08-22 DIAGNOSIS — F17210 Nicotine dependence, cigarettes, uncomplicated: Secondary | ICD-10-CM | POA: Insufficient documentation

## 2017-08-22 DIAGNOSIS — I110 Hypertensive heart disease with heart failure: Secondary | ICD-10-CM | POA: Insufficient documentation

## 2017-08-22 DIAGNOSIS — K811 Chronic cholecystitis: Secondary | ICD-10-CM | POA: Diagnosis not present

## 2017-08-22 DIAGNOSIS — K219 Gastro-esophageal reflux disease without esophagitis: Secondary | ICD-10-CM | POA: Insufficient documentation

## 2017-08-22 DIAGNOSIS — I429 Cardiomyopathy, unspecified: Secondary | ICD-10-CM | POA: Insufficient documentation

## 2017-08-22 DIAGNOSIS — J449 Chronic obstructive pulmonary disease, unspecified: Secondary | ICD-10-CM | POA: Insufficient documentation

## 2017-08-22 HISTORY — PX: CHOLECYSTECTOMY: SHX55

## 2017-08-22 HISTORY — PX: LIVER BIOPSY: SHX301

## 2017-08-22 LAB — GLUCOSE, CAPILLARY
Glucose-Capillary: 158 mg/dL — ABNORMAL HIGH (ref 65–99)
Glucose-Capillary: 182 mg/dL — ABNORMAL HIGH (ref 65–99)

## 2017-08-22 SURGERY — LAPAROSCOPIC CHOLECYSTECTOMY
Anesthesia: General | Site: Abdomen

## 2017-08-22 MED ORDER — HEMOSTATIC AGENTS (NO CHARGE) OPTIME
TOPICAL | Status: DC | PRN
  Administered 2017-08-22: 1 via TOPICAL

## 2017-08-22 MED ORDER — GLYCOPYRROLATE 0.2 MG/ML IJ SOLN
INTRAMUSCULAR | Status: AC
  Filled 2017-08-22: qty 4

## 2017-08-22 MED ORDER — FENTANYL CITRATE (PF) 100 MCG/2ML IJ SOLN
25.0000 ug | INTRAMUSCULAR | Status: DC | PRN
  Administered 2017-08-22: 50 ug via INTRAVENOUS
  Filled 2017-08-22: qty 2

## 2017-08-22 MED ORDER — SUCCINYLCHOLINE 20MG/ML (10ML) SYRINGE FOR MEDFUSION PUMP - OPTIME
INTRAMUSCULAR | Status: DC | PRN
  Administered 2017-08-22: 100 mg via INTRAVENOUS

## 2017-08-22 MED ORDER — FENTANYL CITRATE (PF) 250 MCG/5ML IJ SOLN
INTRAMUSCULAR | Status: AC
  Filled 2017-08-22: qty 5

## 2017-08-22 MED ORDER — LACTATED RINGERS IV SOLN
INTRAVENOUS | Status: DC
  Administered 2017-08-22 (×2): via INTRAVENOUS

## 2017-08-22 MED ORDER — SUGAMMADEX SODIUM 500 MG/5ML IV SOLN
INTRAVENOUS | Status: AC
  Filled 2017-08-22: qty 5

## 2017-08-22 MED ORDER — BUPIVACAINE HCL (PF) 0.5 % IJ SOLN
INTRAMUSCULAR | Status: DC | PRN
  Administered 2017-08-22: 6 mL

## 2017-08-22 MED ORDER — ONDANSETRON HCL 4 MG/2ML IJ SOLN
INTRAMUSCULAR | Status: AC
  Filled 2017-08-22: qty 2

## 2017-08-22 MED ORDER — NEOSTIGMINE METHYLSULFATE 10 MG/10ML IV SOLN
INTRAVENOUS | Status: AC
  Filled 2017-08-22: qty 2

## 2017-08-22 MED ORDER — ROCURONIUM BROMIDE 50 MG/5ML IV SOLN
INTRAVENOUS | Status: AC
  Filled 2017-08-22: qty 1

## 2017-08-22 MED ORDER — SODIUM CHLORIDE 0.9 % IR SOLN
Status: DC | PRN
  Administered 2017-08-22: 1000 mL

## 2017-08-22 MED ORDER — ROCURONIUM 10MG/ML (10ML) SYRINGE FOR MEDFUSION PUMP - OPTIME
INTRAVENOUS | Status: DC | PRN
  Administered 2017-08-22: 25 mg via INTRAVENOUS
  Administered 2017-08-22: 5 mg via INTRAVENOUS

## 2017-08-22 MED ORDER — SUCCINYLCHOLINE CHLORIDE 20 MG/ML IJ SOLN
INTRAMUSCULAR | Status: AC
  Filled 2017-08-22: qty 1

## 2017-08-22 MED ORDER — NEOSTIGMINE METHYLSULFATE 10 MG/10ML IV SOLN
INTRAVENOUS | Status: DC | PRN
  Administered 2017-08-22: 3.5 mg via INTRAVENOUS
  Administered 2017-08-22: 1.5 mg via INTRAVENOUS

## 2017-08-22 MED ORDER — SODIUM CHLORIDE 0.9 % IV SOLN
1.0000 g | INTRAVENOUS | Status: AC
  Administered 2017-08-22: 1 g via INTRAVENOUS

## 2017-08-22 MED ORDER — TRAMADOL HCL 50 MG PO TABS: 50.0000 mg | ORAL_TABLET | Freq: Four times a day (QID) | ORAL | 0 refills | Status: DC | PRN

## 2017-08-22 MED ORDER — ETOMIDATE 2 MG/ML IV SOLN
INTRAVENOUS | Status: DC | PRN
  Administered 2017-08-22: 12 mg via INTRAVENOUS

## 2017-08-22 MED ORDER — LIDOCAINE HCL (PF) 1 % IJ SOLN
INTRAMUSCULAR | Status: AC
  Filled 2017-08-22: qty 5

## 2017-08-22 MED ORDER — DEXAMETHASONE SODIUM PHOSPHATE 4 MG/ML IJ SOLN
4.0000 mg | Freq: Once | INTRAMUSCULAR | Status: AC
  Administered 2017-08-22: 4 mg via INTRAVENOUS

## 2017-08-22 MED ORDER — PHENYLEPHRINE HCL 10 MG/ML IJ SOLN
INTRAMUSCULAR | Status: DC | PRN
  Administered 2017-08-22: 40 ug via INTRAVENOUS
  Administered 2017-08-22: 80 ug via INTRAVENOUS

## 2017-08-22 MED ORDER — ONDANSETRON HCL 4 MG/2ML IJ SOLN
4.0000 mg | Freq: Once | INTRAMUSCULAR | Status: AC
  Administered 2017-08-22: 4 mg via INTRAVENOUS

## 2017-08-22 MED ORDER — MIDAZOLAM HCL 2 MG/2ML IJ SOLN
INTRAMUSCULAR | Status: AC
  Filled 2017-08-22: qty 2

## 2017-08-22 MED ORDER — DOCUSATE SODIUM 100 MG PO CAPS: 100.0000 mg | ORAL_CAPSULE | Freq: Two times a day (BID) | ORAL | 2 refills | Status: DC

## 2017-08-22 MED ORDER — SUGAMMADEX SODIUM 200 MG/2ML IV SOLN
INTRAVENOUS | Status: DC | PRN
  Administered 2017-08-22: 150 mg via INTRAVENOUS

## 2017-08-22 MED ORDER — MIDAZOLAM HCL 2 MG/2ML IJ SOLN
1.0000 mg | INTRAMUSCULAR | Status: AC
  Administered 2017-08-22: 2 mg via INTRAVENOUS

## 2017-08-22 MED ORDER — BUPIVACAINE HCL (PF) 0.5 % IJ SOLN
INTRAMUSCULAR | Status: AC
  Filled 2017-08-22: qty 30

## 2017-08-22 MED ORDER — GLYCOPYRROLATE 0.2 MG/ML IJ SOLN
INTRAMUSCULAR | Status: DC | PRN
  Administered 2017-08-22: .6 mg via INTRAVENOUS
  Administered 2017-08-22: .2 mg via INTRAVENOUS

## 2017-08-22 MED ORDER — DEXAMETHASONE SODIUM PHOSPHATE 4 MG/ML IJ SOLN
INTRAMUSCULAR | Status: AC
  Filled 2017-08-22: qty 1

## 2017-08-22 MED ORDER — SODIUM CHLORIDE 0.9 % IV SOLN
INTRAVENOUS | Status: AC
  Filled 2017-08-22: qty 1

## 2017-08-22 MED ORDER — IPRATROPIUM-ALBUTEROL 0.5-2.5 (3) MG/3ML IN SOLN
3.0000 mL | Freq: Once | RESPIRATORY_TRACT | Status: AC
  Administered 2017-08-22: 3 mL via RESPIRATORY_TRACT

## 2017-08-22 MED ORDER — IPRATROPIUM-ALBUTEROL 0.5-2.5 (3) MG/3ML IN SOLN
RESPIRATORY_TRACT | Status: AC
  Filled 2017-08-22: qty 3

## 2017-08-22 MED ORDER — FENTANYL CITRATE (PF) 100 MCG/2ML IJ SOLN
INTRAMUSCULAR | Status: DC | PRN
  Administered 2017-08-22 (×3): 50 ug via INTRAVENOUS

## 2017-08-22 SURGICAL SUPPLY — 52 items
ADH SKN CLS APL DERMABOND .7 (GAUZE/BANDAGES/DRESSINGS) ×1
APPLIER CLIP ROT 10 11.4 M/L (STAPLE) ×3
APR CLP MED LRG 11.4X10 (STAPLE) ×1
BAG HAMPER (MISCELLANEOUS) ×3 IMPLANT
BAG RETRIEVAL 10 (BASKET) ×1
BAG RETRIEVAL 10MM (BASKET) ×1
BLADE SURG 15 STRL LF DISP TIS (BLADE) ×1 IMPLANT
BLADE SURG 15 STRL SS (BLADE) ×3
CHLORAPREP W/TINT 26ML (MISCELLANEOUS) ×3 IMPLANT
CLIP APPLIE ROT 10 11.4 M/L (STAPLE) ×1 IMPLANT
CLOTH BEACON ORANGE TIMEOUT ST (SAFETY) ×3 IMPLANT
COVER LIGHT HANDLE STERIS (MISCELLANEOUS) ×6 IMPLANT
DECANTER SPIKE VIAL GLASS SM (MISCELLANEOUS) ×3 IMPLANT
DERMABOND ADVANCED (GAUZE/BANDAGES/DRESSINGS) ×2
DERMABOND ADVANCED .7 DNX12 (GAUZE/BANDAGES/DRESSINGS) ×1 IMPLANT
ELECT REM PT RETURN 9FT ADLT (ELECTROSURGICAL) ×3
ELECTRODE REM PT RTRN 9FT ADLT (ELECTROSURGICAL) ×1 IMPLANT
FILTER SMOKE EVAC LAPAROSHD (FILTER) ×3 IMPLANT
FORMALIN 10 PREFIL 120ML (MISCELLANEOUS) ×3 IMPLANT
GLOVE BIO SURGEON STRL SZ 6.5 (GLOVE) ×2 IMPLANT
GLOVE BIO SURGEONS STRL SZ 6.5 (GLOVE) ×1
GLOVE BIOGEL PI IND STRL 6.5 (GLOVE) ×1 IMPLANT
GLOVE BIOGEL PI IND STRL 7.0 (GLOVE) ×1 IMPLANT
GLOVE BIOGEL PI INDICATOR 6.5 (GLOVE) ×4
GLOVE BIOGEL PI INDICATOR 7.0 (GLOVE) ×2
GOWN STRL REUS W/TWL LRG LVL3 (GOWN DISPOSABLE) ×9 IMPLANT
HEMOSTAT SNOW SURGICEL 2X4 (HEMOSTASIS) ×3 IMPLANT
INST SET LAPROSCOPIC AP (KITS) ×3 IMPLANT
IV NS IRRIG 3000ML ARTHROMATIC (IV SOLUTION) IMPLANT
KIT ROOM TURNOVER APOR (KITS) ×3 IMPLANT
MANIFOLD NEPTUNE II (INSTRUMENTS) ×3 IMPLANT
NDL BIOPSY 14X6 SOFT TISS (NEEDLE) IMPLANT
NDL INSUFFLATION 14GA 120MM (NEEDLE) ×1 IMPLANT
NEEDLE BIOPSY 14X6 SOFT TISS (NEEDLE) ×3 IMPLANT
NEEDLE INSUFFLATION 14GA 120MM (NEEDLE) ×3 IMPLANT
NS IRRIG 1000ML POUR BTL (IV SOLUTION) ×3 IMPLANT
PACK LAP CHOLE LZT030E (CUSTOM PROCEDURE TRAY) ×3 IMPLANT
PAD ARMBOARD 7.5X6 YLW CONV (MISCELLANEOUS) ×3 IMPLANT
SET BASIN LINEN APH (SET/KITS/TRAYS/PACK) ×3 IMPLANT
SET TUBE IRRIG SUCTION NO TIP (IRRIGATION / IRRIGATOR) IMPLANT
SLEEVE ENDOPATH XCEL 5M (ENDOMECHANICALS) ×3 IMPLANT
SUT MNCRL AB 4-0 PS2 18 (SUTURE) ×3 IMPLANT
SUT VICRYL 0 UR6 27IN ABS (SUTURE) ×3 IMPLANT
SYS BAG RETRIEVAL 10MM (BASKET) ×1
SYSTEM BAG RETRIEVAL 10MM (BASKET) ×1 IMPLANT
TROCAR XCEL NON-BLD 11X100MML (ENDOMECHANICALS) ×3 IMPLANT
TROCAR XCEL NON-BLD 5MMX100MML (ENDOMECHANICALS) ×3 IMPLANT
TROCAR Z-THREAD SLEEVE 11X100 (TROCAR) ×3 IMPLANT
TUBE CONNECTING 12'X1/4 (SUCTIONS) ×1
TUBE CONNECTING 12X1/4 (SUCTIONS) ×2 IMPLANT
TUBING INSUFFLATION (TUBING) ×3 IMPLANT
WARMER LAPAROSCOPE (MISCELLANEOUS) ×3 IMPLANT

## 2017-08-22 NOTE — Anesthesia Postprocedure Evaluation (Signed)
Anesthesia Post Note  Patient: Tammy Mcpherson  Procedure(s) Performed: LAPAROSCOPIC CHOLECYSTECTOMY (N/A Abdomen) LIVER BIOPSY (N/A Abdomen)  Patient location during evaluation: PACU Anesthesia Type: General Level of consciousness: awake Pain management: pain level controlled Vital Signs Assessment: post-procedure vital signs reviewed and stable Respiratory status: spontaneous breathing, nonlabored ventilation and respiratory function stable Postop Assessment: no apparent nausea or vomiting Anesthetic complications: no     Last Vitals:  Vitals:   08/22/17 1100 08/22/17 1101  BP: 140/71   Pulse: 87   Resp: 18   Temp:    SpO2: 93% 93%    Last Pain:  Vitals:   08/22/17 1045  TempSrc:   PainSc: 10-Worst pain ever                 Sostenes Kauffmann J

## 2017-08-22 NOTE — Transfer of Care (Signed)
Immediate Anesthesia Transfer of Care Note  Patient: Tammy Mcpherson  Procedure(s) Performed: LAPAROSCOPIC CHOLECYSTECTOMY (N/A Abdomen) LIVER BIOPSY (N/A Abdomen)  Patient Location: PACU  Anesthesia Type:General  Level of Consciousness: awake and alert   Airway & Oxygen Therapy: Patient Spontanous Breathing and Patient connected to face mask oxygen  Post-op Assessment: Report given to RN  Post vital signs: Reviewed and stable  Last Vitals:  Vitals:   08/22/17 0820 08/22/17 0945  BP: 136/62 (!) (P) 174/86  Resp: (!) 48   Temp:  (P) 36.4 C  SpO2: 92%     Last Pain:  Vitals:   08/22/17 0736  TempSrc: Oral  PainSc: 0-No pain      Patients Stated Pain Goal: 8 (71/85/50 1586)  Complications: No apparent anesthesia complications

## 2017-08-22 NOTE — Discharge Instructions (Signed)
Discharge Instructions: Shower per your regular routine. Take tylenol and ibuprofen as needed for pain control, alternating every 4-6 hours.  Take tramadol for breakthrough pain. Take colace for constipation related to narcotic pain medication. Do not pick at the dermabond glue on your incision sites.  Do not restart your plavix until 08/25/17 given that you had a liver biopsy.    Liver Biopsy, Care After These instructions give you information on caring for yourself after your procedure. Your doctor may also give you more specific instructions. Call your doctor if you have any problems or questions after your procedure. Follow these instructions at home:  Rest at home for 1-2 days or as told by your doctor.  Have someone stay with you for at least 24 hours.  Do not do these things in the first 24 hours: ? Drive. ? Use machinery. ? Take care of other people. ? Sign legal documents. ? Take a bath or shower.  There are many different ways to close and cover a cut (incision). For example, a cut can be closed with stitches, skin glue, or adhesive strips. Follow your doctor's instructions on: ? Taking care of your cut. ? Changing and removing your bandage (dressing). ? Removing whatever was used to close your cut.  Do not drink alcohol in the first week.  Do not lift more than 5 pounds or play contact sports for the first 2 weeks.  Take medicines only as told by your doctor. For 1 week, do not take medicine that has aspirin in it or medicines like ibuprofen.  Get your test results. Contact a doctor if:  A cut bleeds and leaves more than just a small spot of blood.  A cut is red, puffs up (swells), or hurts more than before.  Fluid or something else comes from a cut.  A cut smells bad.  You have a fever or chills. Get help right away if:  You have swelling, bloating, or pain in your belly (abdomen).  You get dizzy or faint.  You have a rash.  You feel sick to your  stomach (nauseous) or throw up (vomit).  You have trouble breathing, feel short of breath, or feel faint.  Your chest hurts.  You have problems talking or seeing.  You have trouble balancing or moving your arms or legs. This information is not intended to replace advice given to you by your health care provider. Make sure you discuss any questions you have with your health care provider. Document Released: 06/27/2008 Document Revised: 02/24/2016 Document Reviewed: 11/14/2013 Elsevier Interactive Patient Education  2018 [employer].  Laparoscopic Cholecystectomy, Care After This sheet gives you information about how to care for yourself after your procedure. Your doctor may also give you more specific instructions. If you have problems or questions, contact your doctor. Follow these instructions at home: Care for cuts from surgery (incisions)   Follow instructions from your doctor about how to take care of your cuts from surgery. Make sure you: ? Wash your hands with soap and water before you change your bandage (dressing). If you cannot use soap and water, use hand sanitizer. ? Change your bandage as told by your doctor. ? Leave stitches (sutures), skin glue, or skin tape (adhesive) strips in place. They may need to stay in place for 2 weeks or longer. If tape strips get loose and curl up, you may trim the loose edges. Do not remove tape strips completely unless your doctor says it is okay.  Do  not take baths, swim, or use a hot tub until your doctor says it is okay. Ask your doctor if you can take showers. You may only be allowed to take sponge baths for bathing.  Check your surgical cut area every day for signs of infection. Check for: ? More redness, swelling, or pain. ? More fluid or blood. ? Warmth. ? Pus or a bad smell. Activity  Do not drive or use heavy machinery while taking prescription pain medicine.  Do not lift anything that is heavier than 10 lb (4.5 kg) until your  doctor says it is okay.  Do not play contact sports until your doctor says it is okay.  Do not drive for 24 hours if you were given a medicine to help you relax (sedative).  Rest as needed. Do not return to work or school until your doctor says it is okay. General instructions  Take over-the-counter and prescription medicines only as told by your doctor.  To prevent or treat constipation while you are taking prescription pain medicine, your doctor may recommend that you: ? Drink enough fluid to keep your pee (urine) clear or pale yellow. ? Take over-the-counter or prescription medicines. ? Eat foods that are high in fiber, such as fresh fruits and vegetables, whole grains, and beans. ? Limit foods that are high in fat and processed sugars, such as fried and sweet foods. Contact a doctor if:  You develop a rash.  You have more redness, swelling, or pain around your surgical cuts.  You have more fluid or blood coming from your surgical cuts.  Your surgical cuts feel warm to the touch.  You have pus or a bad smell coming from your surgical cuts.  You have a fever.  One or more of your surgical cuts breaks open. Get help right away if:  You have trouble breathing.  You have chest pain.  You have pain that is getting worse in your shoulders.  You faint or feel dizzy when you stand.  You have very bad pain in your belly (abdomen).  You are sick to your stomach (nauseous) for more than one day.  You have throwing up (vomiting) that lasts for more than one day.  You have leg pain. This information is not intended to replace advice given to you by your health care provider. Make sure you discuss any questions you have with your health care provider. Document Released: 06/27/2008 Document Revised: 04/08/2016 Document Reviewed: 03/06/2016 Elsevier Interactive Patient Education  2017 [employer].

## 2017-08-22 NOTE — Anesthesia Preprocedure Evaluation (Signed)
Anesthesia Evaluation  Patient identified by MRN, date of birth, ID band Patient awake    Reviewed: Allergy & Precautions, H&P , NPO status , Patient's Chart, lab work & pertinent test results, reviewed documented beta blocker date and time   Airway Mallampati: II  TM Distance: >3 FB     Dental  (+) Edentulous Upper, Edentulous Lower   Pulmonary COPD, Current Smoker,    breath sounds clear to auscultation       Cardiovascular hypertension, Pt. on medications + CAD, + Peripheral Vascular Disease, +CHF, + Orthopnea and + DOE  + dysrhythmias Supra Ventricular Tachycardia  Rhythm:Regular Rate:Normal  08-13-17 echo: Left ventricle:   Wall thickness was increased in a pattern of mild LVH.   Systolic function was normal. The estimated ejection fraction was in the range of 60% to 65%. Wall motion was normal; there were no regional wall motion abnormalities.    Neuro/Psych    GI/Hepatic negative GI ROS, PUD, GERD  Medicated,  Endo/Other    Renal/GU      Musculoskeletal   Abdominal   Peds  Hematology  (+) Blood dyscrasia, anemia ,   Anesthesia Other Findings   Reproductive/Obstetrics                             Anesthesia Physical Anesthesia Plan  ASA: IV  Anesthesia Plan: General   Post-op Pain Management:    Induction: Intravenous  PONV Risk Score and Plan:   Airway Management Planned: Oral ETT  Additional Equipment:   Intra-op Plan:   Post-operative Plan: Extubation in OR  Informed Consent: I have reviewed the patients History and Physical, chart, labs and discussed the procedure including the risks, benefits and alternatives for the proposed anesthesia with the patient or authorized representative who has indicated his/her understanding and acceptance.     Plan Discussed with:   Anesthesia Plan Comments: (amidate induction)        Anesthesia Quick Evaluation

## 2017-08-22 NOTE — Anesthesia Procedure Notes (Signed)
Procedure Name: Intubation Date/Time: 08/22/2017 8:36 AM Performed by: Ollen Bowl, CRNA Pre-anesthesia Checklist: Patient identified, Patient being monitored, Timeout performed, Emergency Drugs available and Suction available Patient Re-evaluated:Patient Re-evaluated prior to induction Oxygen Delivery Method: Circle system utilized Preoxygenation: Pre-oxygenation with 100% oxygen Induction Type: IV induction Ventilation: Mask ventilation without difficulty Laryngoscope Size: Mac and 3 Grade View: Grade I Tube type: Oral Tube size: 7.0 mm Number of attempts: 1 Airway Equipment and Method: Stylet Placement Confirmation: ETT inserted through vocal cords under direct vision,  positive ETCO2 and breath sounds checked- equal and bilateral Secured at: 21 cm Tube secured with: Tape Dental Injury: Teeth and Oropharynx as per pre-operative assessment

## 2017-08-22 NOTE — Op Note (Signed)
Operative Note   Preoperative Diagnosis: Gallbladder Mass    Postoperative Diagnosis: Same   Procedure(s) Performed: Laparoscopic cholecystectomy and liver biopsy    Surgeon: Lanell Matar. Constance Haw, MD   Assistants: Aviva Signs, MD    Anesthesia: General endotracheal   Anesthesiologist: Lerry Liner, MD    Specimens: Gallbladder and liver biopsy    Estimated Blood Loss: Minimal    Blood Replacement: None    Complications: None   Wound Class: Clean contaminated    Operative Findings: Gallbladder with stones, fatty soft dome; liver was sclerosed at the dome and biopsied    Procedure: The patient was taken to the operating room and placed supine. General endotracheal anesthesia was induced. Intravenous antibiotics were administered per protocol. An orogastric tube positioned to decompress the stomach. The abdomen was prepared and draped in the usual sterile fashion.    A supraumbilical incision was made and a towel clip was used to elevate the abdominal wall.  The Veress technique was utilized to achieve pneumoperitoneum to 15 mmHg with carbon dioxide through this incision. A 11 mm optiview port was placed through the supraumbilical region, and a 10 mm 0-degree operative laparoscope was introduced. The area underlying the trocar and Veress needle were inspected and without evidence of injury.  Remaining trocars were placed under direct vision. Two 5 mm ports were placed in the right abdomen, between the anterior axillary and midclavicular line.  A final 11 mm port was placed through the mid-epigastrium, near the falciform ligament.   The gallbladder appeared soft and had minimal peritoneal attachments. These were released.  The gallbladder dome where the concerning mass was reported on CT 2017 was soft but fatty.  There was no evidence of any spread of disease or extension of any process through the wall of the gallbladder.  The liver in the area of the dome had some minor sclerosis.      The gallbladder fundus was elevated cephalad and the infundibulum was retracted to the patient's right. The gallbladder/cystic duct junction was skeletonized. The cystic artery noted in the triangle of Calot and was also skeletonized.  We then continued liberal medial and lateral dissection until the critical view of safety was achieved.    The cystic duct was triply clipped and divided and cystic artery was doubly clipped and divided. The gallbladder was then dissected from the liver bed with electrocautery. A biopsy at the liver edge where the dome of the gallbladder had been was performed in the area of sclerosis.  The specimen was placed in an Endopouch and was retrieved through the epigastric site.   Final inspection revealed acceptable hemostasis.  The biopsy site was cauterized and a surgical Emogene Morgan was placed.  The epigastric and umbilical port sites were smaller than my finger and unable to be closed.  Trocars were removed and pneumoperitoneum was released. Skin incisions were closed with 4-0 Monocryl subcuticular sutures and Dermabond. The patient was awakened from anesthesia and extubated without complication.   All counts were correct at the end of the case.    Curlene Labrum, MD [facility] [address] [place], [place] [address] [phone] (office)

## 2017-08-22 NOTE — Interval H&P Note (Signed)
History and Physical Interval Note:  08/22/2017 8:15 AM  Tammy Mcpherson  has presented today for surgery, with the diagnosis of gallbladder mass  The various methods of treatment have been discussed with the patient and family. After consideration of risks, benefits and other options for treatment, the patient has consented to  Procedure(s): LAPAROSCOPIC CHOLECYSTECTOMY (N/A) as a surgical intervention .  The patient's history has been reviewed, patient examined, no change in status, stable for surgery.  I have reviewed the patient's chart and labs.  Questions were answered to the patient's satisfaction.    No new changes. Reports that she had tenderness under the left breast with her echo.  I reviewed CTA from 04/2017 and see this area and do not see anything unusual.  Cardiology deemed acceptable risk. Discussed that this is unlikely to be a cancer but is still in the differential given her imaging history with CT last year.    Will proceed with laparoscopic cholecystectomy, warned against need for open procedure if any concern, and need for further surgery if pathology demonstrated a cancer.  Virl Cagey

## 2017-08-27 ENCOUNTER — Telehealth: Payer: Self-pay | Admitting: General Surgery

## 2017-08-27 ENCOUNTER — Encounter (HOSPITAL_COMMUNITY): Payer: Self-pay | Admitting: General Surgery

## 2017-08-27 ENCOUNTER — Ambulatory Visit: Payer: BLUE CROSS/BLUE SHIELD | Admitting: Gastroenterology

## 2017-08-27 NOTE — Telephone Encounter (Addendum)
Called patient to inform about pathology results, no answer. Called again and left message.   Diagnosis 1. Gallbladder - CHRONIC CHOLECYSTITIS WITH ROKITANSKY-ASCHOFF SINUSES (ADENOMYOMATOSIS). - NO EVIDENCE OF MALIGNANCY. 2. Liver, biopsy - BENIGN LIVER TISSUE WITH MILD STEATOSIS. - NO EVIDENCE OF MALIGNANCY.  Curlene Labrum, MD [facility] [address] [place], [place] [address] [phone] (office)

## 2017-08-30 ENCOUNTER — Encounter (HOSPITAL_BASED_OUTPATIENT_CLINIC_OR_DEPARTMENT_OTHER): Payer: BLUE CROSS/BLUE SHIELD

## 2017-08-30 ENCOUNTER — Encounter (HOSPITAL_COMMUNITY): Payer: Self-pay

## 2017-08-30 ENCOUNTER — Other Ambulatory Visit: Payer: Self-pay

## 2017-08-30 VITALS — BP 172/62 | HR 84 | Temp 98.4°F | Resp 20

## 2017-08-30 DIAGNOSIS — D508 Other iron deficiency anemias: Secondary | ICD-10-CM

## 2017-08-30 MED ORDER — CYANOCOBALAMIN 1000 MCG/ML IJ SOLN
1000.0000 ug | Freq: Once | INTRAMUSCULAR | Status: AC
  Administered 2017-08-30: 1000 ug via INTRAMUSCULAR

## 2017-08-30 NOTE — Progress Notes (Signed)
Tammy Mcpherson presents today for injection per the provider's orders.  B12 administration without incident; see MAR for injection details.  Patient tolerated procedure well and without incident.  No questions or complaints noted at this time.  Discharged ambulatory.  

## 2017-09-06 ENCOUNTER — Encounter: Payer: Self-pay | Admitting: General Surgery

## 2017-09-06 ENCOUNTER — Ambulatory Visit (INDEPENDENT_AMBULATORY_CARE_PROVIDER_SITE_OTHER): Payer: Self-pay | Admitting: General Surgery

## 2017-09-06 VITALS — BP 161/98 | HR 88 | Temp 98.9°F | Resp 18 | Ht 65.0 in | Wt 157.0 lb

## 2017-09-06 DIAGNOSIS — K811 Chronic cholecystitis: Secondary | ICD-10-CM

## 2017-09-06 NOTE — Progress Notes (Signed)
[facility] Note   HPI:  61 y.o. Adult presents to clinic for post-op follow-up evaluation after laparoscopic cholecystectomy. Patient reports no pain or nausea/vomiting. Feeling well.  Review of Systems:  No fevers or chills All other review of systems: otherwise negative   Pathology:NOSIS Diagnosis 1. Gallbladder - CHRONIC CHOLECYSTITIS WITH ROKITANSKY-ASCHOFF SINUSES (ADENOMYOMATOSIS). - NO EVIDENCE OF MALIGNANCY. 2. Liver, biopsy - BENIGN LIVER TISSUE WITH MILD STEATOSIS. - NO EVIDENCE OF MALIGNANCY.  Microscopic Comment 2. The core biopsies show liver tissue with normal architecture and focal, mild hepatocyte steatosis. The features are not diagnostic of steatohepatitis and there are no granulomas or evidence of malignancy. No increase fibrosis is identifie with trichrome or reticulin stains, no increase iron is identified with iron stain and no alpha on antitrypsin deposits are identified with PAS stain.  Vital Signs:  BP (!) 161/98   Pulse 88   Temp 98.9 F (37.2 C)   Resp 18   Ht 5\' 5"  (1.651 m)   Wt 157 lb (71.2 kg)   BMI 26.13 kg/m    Physical Exam:  Physical Exam  Constitutional: She is well-developed, well-nourished, and in no distress.  Cardiovascular: Normal rate.  Pulmonary/Chest: Effort normal.  Abdominal: Soft. She exhibits no distension. There is no tenderness.  Port sites clean, dry and intact, dermabond peeling, no erythema  Vitals reviewed.   Laboratory studies: None   Imaging:  None   Assessment:  61 y.o. yo Adult s/p laparoscopic cholecystectomy for suspected gallbladder mass with pathology confirming only cholecystitis and no malignancy. Overall doing great.  Plan:  - Follow up PRN    Curlene Labrum, MD [facility] [place],  [address] [phone] (office) [place]

## 2017-09-12 ENCOUNTER — Ambulatory Visit: Payer: BLUE CROSS/BLUE SHIELD | Admitting: Gastroenterology

## 2017-09-12 ENCOUNTER — Encounter: Payer: Self-pay | Admitting: Gastroenterology

## 2017-09-12 VITALS — BP 178/80 | HR 80 | Temp 97.0°F | Ht 65.0 in | Wt 160.6 lb

## 2017-09-12 DIAGNOSIS — D500 Iron deficiency anemia secondary to blood loss (chronic): Secondary | ICD-10-CM

## 2017-09-12 NOTE — Assessment & Plan Note (Signed)
61 year old female with IDA in setting of gastritis/AVMs, on ASA/Plavix. Denies any other NSAIDs, no aspirin powders. EGD up-to-date, colonoscopy on file from last year. Hold on capsule study as obvious source noted on recent EGD. However, we discussed following closely with Hematology, and if worsening anemia could consider small bowel evaluation. Will have her return in 6 months.

## 2017-09-12 NOTE — Patient Instructions (Signed)
I am glad you are doing well!  I will see you in 6 months. Continue to see the Hematology specialists.  Please call if you see any black stool, feel fatigued, abdominal pain.   Have a great holiday season!

## 2017-09-12 NOTE — Progress Notes (Signed)
Referring Provider: Raylene Everts, MD Primary Care Physician:  Raylene Everts, MD Primary GI: Dr. Oneida Alar   Chief Complaint  Patient presents with  . Anemia    f/u.     HPI:   Tammy Mcpherson is a 61 y.o. adult presenting today with a history of PUD, IDA. EGD for surveillance recently done with non-bleeding erosive gastropathy. Anemia due to erosive gastritis/AVMs in setting of ASA/Plavix use. Has completed cholecystectomy in interim from last visit. Referred to Hematology due to [place]. Had blood transfusion due to drop in Hgb prior to EGD.    No melena. No abdominal pain, N/V. Denies any NSAIDs or aspirin powders. Remains on aspirin and Plavix. Feels better. Most recent Hgb improved to 11 range. Close follow-up with hematology. On PPI.      Past Medical History:  Diagnosis Date  . Anemia 04/30/2016  . Cardiomyopathy [facility])    Diagnosed July 2017  . Coronary artery calcification seen on CT scan   . Essential hypertension   . GERD (gastroesophageal reflux disease)   . History of bronchitis   . History of GI bleed   . Hyperlipidemia   . Iron deficiency anemia 05/12/2016   Bleeding ulcers  . Pollen allergy   . PSVT (paroxysmal supraventricular tachycardia) ([place])   . PVC's (premature ventricular contractions)     Past Surgical History:  Procedure Laterality Date  . ABDOMINAL HYSTERECTOMY     polyps  about age 63  . Barbie Banner OSTEOTOMY Right 07/10/2013   Procedure: Barbie Banner OSTEOTOMY RIGHT FOOT;  Surgeon: Marcheta Grammes, DPM;  Location: [facility];  Service: Orthopedics;  Laterality: Right;  . AMPUTATION Left 05/09/2017   Procedure: AMPUTATION 5TH TOE LEFT FOOT;  Surgeon: [name], DPM;  Location: [facility];  Service: Podiatry;  Laterality: Left;  . AMPUTATION Left 06/13/2017   Procedure: AMPUTATION 2ND TOE LEFT FOOT;  Surgeon: [name], DPM;  Location: [facility];  Service: Podiatry;  Laterality: Left;  . BUNIONECTOMY Right 07/10/2013   Procedure: VOGLER  BUNIONECTOMY RIGHT FOOT;  Surgeon: Marcheta Grammes, DPM;  Location: [facility];  Service: Orthopedics;  Laterality: Right;  . CHOLECYSTECTOMY N/A 08/22/2017   Procedure: LAPAROSCOPIC CHOLECYSTECTOMY;  Surgeon: Virl Cagey, MD;  Location: [facility];  Service: General;  Laterality: N/A;  . COLONOSCOPY N/A 07/07/2016   Dr. Oneida Alar; redundant left colon, diverticulosis at hepatic flexure, non-bleeding internal hemorrhoids  . ESOPHAGOGASTRODUODENOSCOPY N/A 07/07/2016   Dr. Oneida Alar: many non-bleeding cratered gastric ulcers without stigmata of bleeding in gastric antrum. four 2-3 mm angioectasias without bleeding in duodenal bulb and second portion of duodenum s/p APC. Chroni gastritis on path.   . ESOPHAGOGASTRODUODENOSCOPY N/A 08/03/2017   Dr. Oneida Alar: erosive gastritis, AVMs. Found a single non-bleeding angioectasia in stomach, s/p APC therapy. Four non-bleeding angioectasias in duodenum s/p APC. Non-bleeding erosive gastropathy  . IR ANGIOGRAM EXTREMITY LEFT  04/24/2017  . IR FEM POP ART ATHERECT INC PTA MOD SED  04/24/2017  . IR INFUSION THROMBOL ARTERIAL INITIAL (MS)  04/24/2017  . IR RADIOLOGIST EVAL & MGMT  12/05/2016  . IR US GUIDE VASC ACCESS RIGHT  04/24/2017  . LIVER BIOPSY N/A 08/22/2017   Procedure: LIVER BIOPSY;  Surgeon: Virl Cagey, MD;  Location: [facility];  Service: General;  Laterality: N/A;  . METATARSAL HEAD EXCISION Right 07/10/2013   Procedure: METATARSAL HEAD RESECTION OF DIGITS 2 AND 3 RIGHT FOOT;  Surgeon: Marcheta Grammes, DPM;  Location: [facility];  Service: Orthopedics;  Laterality: Right;  . PROXIMAL INTERPHALANGEAL FUSION (PIP) Right 07/10/2013   Procedure: ARTHRODESIS PIPJ  2ND DIGIT RIGHT FOOT;  Surgeon: Marcheta Grammes, DPM;  Location: [facility];  Service: Orthopedics;  Laterality: Right;    Current Outpatient Medications  Medication Sig Dispense Refill  . acetaminophen (TYLENOL) 500 MG tablet Take 500 mg by mouth at bedtime as needed for moderate pain.    Marland Kitchen  albuterol (PROAIR HFA) 108 (90 Base) MCG/ACT inhaler INHALE 2 PUFFS INTO THE LUNGS EVERY 6 HOURS AS NEEDED FOR WHEEZING OR SHORTNESS OF BREATH. (Patient taking differently: Inhale 1-2 puffs into the lungs every 6 (six) hours as needed for wheezing or shortness of breath. ) 18 g 3  . aspirin 81 MG chewable tablet Chew 81 mg by mouth daily.    Marland Kitchen atorvastatin (LIPITOR) 20 MG tablet Take 1 tablet (20 mg total) by mouth daily. 90 tablet 3  . bisoprolol (ZEBETA) 10 MG tablet Take 1 tablet (10 mg total) by mouth daily. 90 tablet 3  . cholecalciferol (VITAMIN D) 1000 units tablet Take 1,000 Units by mouth daily.     . clopidogrel (PLAVIX) 75 MG tablet Take 75 mg by mouth at bedtime.     . docusate sodium (COLACE) 100 MG capsule Take 1 capsule (100 mg total) by mouth 2 (two) times daily. (Patient taking differently: Take 100 mg by mouth 2 (two) times daily as needed. ) 60 capsule 2  . furosemide (LASIX) 20 MG tablet Take 1 tablet (20 mg total) by mouth daily. (Patient taking differently: Take 20 mg by mouth daily as needed for fluid. ) 30 tablet 6  . losartan (COZAAR) 100 MG tablet Take 1 tablet (100 mg total) by mouth daily. 90 tablet 3  . Multiple Vitamin (MULTIVITAMIN WITH MINERALS) TABS tablet Take 1 tablet by mouth daily.    . pantoprazole (PROTONIX) 40 MG tablet Take 1 tablet (40 mg total) by mouth daily. 90 tablet 3  . traMADol (ULTRAM) 50 MG tablet Take 1 tablet (50 mg total) by mouth every 6 (six) hours as needed for severe pain. 20 tablet 0   No current facility-administered medications for this visit.     Allergies as of 09/12/2017 - Review Complete 09/12/2017  Allergen Reaction Noted  . Bee venom Swelling 05/19/2016  . Codeine Anaphylaxis 04/30/2016  . Penicillins Swelling and Rash 03/07/2012  . Sudafed [pseudoephedrine hcl] Rash 06/27/2013    Family History  Adopted: Yes  Problem Relation Age of Onset  . Hypertension Father   . Coronary artery disease Sister   . Ulcers Maternal  Grandmother   . Colon cancer Neg Hx        unknown, was adopted    Social History   Socioeconomic History  . Marital status: Married    Spouse name: [name]  . Number of children: 1  . Years of education: 54  . Highest education level: None  Social Needs  . Financial resource strain: None  . Food insecurity - worry: None  . Food insecurity - inability: None  . Transportation needs - medical: None  . Transportation needs - non-medical: None  Occupational History  . Occupation: disabled    Comment: heart  Tobacco Use  . Smoking status: Current Every Day Smoker    Packs/day: 0.25    Years: 37.00    Pack years: 9.25    Types: Cigarettes    Start date: 05/10/1975  . Smokeless tobacco: Never Used  . Tobacco comment: on nicotrol inhalers  Substance and Sexual Activity  . Alcohol use: No  . Drug use: No  . Sexual activity: Yes    Birth control/protection: Surgical  Other Topics Concern  . None  Social History Narrative   Disabled   Lives with husband [name]   Also helping mother in law and her friend   Two dogs    Review of Systems: Gen: Denies fever, chills, anorexia. Denies fatigue, weakness, weight loss.  CV: Denies chest pain, palpitations, syncope, peripheral edema, and claudication. Resp: Denies dyspnea at rest, cough, wheezing, coughing up blood, and pleurisy. GI: see HPI  Derm: Denies rash, itching, dry skin Psych: Denies depression, anxiety, memory loss, confusion. No homicidal or suicidal ideation.  Heme: Denies bruising, bleeding, and enlarged lymph nodes.  Physical Exam: BP (!) 178/80   Pulse 80   Temp (!) 97 F (36.1 C) (Oral)   Ht 5\' 5"  (1.651 m)   Wt 160 lb 9.6 oz (72.8 kg)   BMI 26.73 kg/m  General:   Alert and oriented. No distress noted. Pleasant and cooperative.  Head:  Normocephalic and atraumatic. Eyes:  Conjuctiva clear without scleral icterus. Mouth:  Oral mucosa pink and moist.  Abdomen:  +BS, soft, non-tender and non-distended. No rebound  or guarding. No HSM or masses noted. Msk:  Symmetrical without gross deformities. Normal posture. Extremities:  Without edema. Neurologic:  Alert and  oriented x4 Psych:  Alert and cooperative. Normal mood and affect.  Lab Results  Component Value Date   WBC 13.0 (H) 08/20/2017   HGB 11.0 (L) 08/20/2017   HCT 37.2 08/20/2017   MCV 86.9 08/20/2017   PLT 476 (H) 08/20/2017

## 2017-09-12 NOTE — Progress Notes (Signed)
cc'ed to pcp °

## 2017-09-29 LAB — COMPLETE METABOLIC PANEL WITH GFR
AG Ratio: 1.4 (calc) (ref 1.0–2.5)
ALKALINE PHOSPHATASE (APISO): 107 U/L (ref 33–130)
ALT: 11 U/L (ref 6–29)
AST: 11 U/L (ref 10–35)
Albumin: 3.6 g/dL (ref 3.6–5.1)
BUN: 7 mg/dL (ref 7–25)
CO2: 23 mmol/L (ref 20–32)
CREATININE: 0.73 mg/dL (ref 0.50–0.99)
Calcium: 9.2 mg/dL (ref 8.6–10.4)
Chloride: 106 mmol/L (ref 98–110)
GFR, Est African American: 104 mL/min/{1.73_m2} (ref >=60)
GFR, Est Non African American: 90 mL/min/{1.73_m2} (ref >=60)
GLOBULIN: 2.5 g/dL (ref 1.9–3.7)
GLUCOSE: 199 mg/dL — AB (ref 65–139)
Potassium: 4.2 mmol/L (ref 3.5–5.3)
SODIUM: 139 mmol/L (ref 135–146)
Total Bilirubin: 0.2 mg/dL (ref 0.2–1.2)
Total Protein: 6.1 g/dL (ref 6.1–8.1)

## 2017-09-29 LAB — CBC
HEMATOCRIT: 36.6 % (ref 35.0–45.0)
Hemoglobin: 12 g/dL (ref 11.7–15.5)
MCH: 28.8 pg (ref 27.0–33.0)
MCHC: 32.8 g/dL (ref 32.0–36.0)
MCV: 88 fL (ref 80.0–100.0)
MPV: 10.2 fL (ref 7.5–12.5)
Platelets: 450 10*3/uL — ABNORMAL HIGH (ref 140–400)
RBC: 4.16 10*6/uL (ref 3.80–5.10)
RDW: 21.1 % — AB (ref 11.0–15.0)
WBC: 10.4 10*3/uL (ref 3.8–10.8)

## 2017-09-29 LAB — URINALYSIS, ROUTINE W REFLEX MICROSCOPIC
BILIRUBIN URINE: NEGATIVE
GLUCOSE, UA: NEGATIVE
Hgb urine dipstick: NEGATIVE
KETONES UR: NEGATIVE
Leukocytes, UA: NEGATIVE
Nitrite: NEGATIVE
PH: 6 (ref 5.0–8.0)
Protein, ur: NEGATIVE
SPECIFIC GRAVITY, URINE: 1.01 (ref 1.001–1.03)

## 2017-09-29 LAB — LIPID PANEL
CHOL/HDL RATIO: 4.4 (calc) (ref <5.0)
CHOLESTEROL: 148 mg/dL (ref <200)
HDL: 34 mg/dL — AB (ref >50)
LDL Cholesterol (Calc): 88 mg/dL (calc)
Non-HDL Cholesterol (Calc): 114 mg/dL (calc) (ref <130)
Triglycerides: 164 mg/dL — ABNORMAL HIGH (ref <150)

## 2017-09-29 LAB — HEMOGLOBIN A1C
EAG (MMOL/L): 6.5 (calc)
Hgb A1c MFr Bld: 5.7 % of total Hgb — ABNORMAL HIGH (ref <5.7)
Mean Plasma Glucose: 117 (calc)

## 2017-09-29 LAB — MICROALBUMIN / CREATININE URINE RATIO
CREATININE, URINE: 58 mg/dL (ref 20–275)
Microalb Creat Ratio: 5 mcg/mg creat (ref <30)
Microalb, Ur: 0.3 mg/dL

## 2017-10-01 ENCOUNTER — Ambulatory Visit (HOSPITAL_COMMUNITY): Payer: BLUE CROSS/BLUE SHIELD

## 2017-10-03 ENCOUNTER — Encounter: Payer: Self-pay | Admitting: Family Medicine

## 2017-10-04 ENCOUNTER — Encounter: Payer: Self-pay | Admitting: Family Medicine

## 2017-10-04 ENCOUNTER — Ambulatory Visit: Payer: BLUE CROSS/BLUE SHIELD | Admitting: Family Medicine

## 2017-10-04 ENCOUNTER — Other Ambulatory Visit: Payer: Self-pay

## 2017-10-04 VITALS — BP 138/78 | HR 90 | Temp 98.6°F | Resp 18 | Ht 65.0 in | Wt 157.0 lb

## 2017-10-04 DIAGNOSIS — F411 Generalized anxiety disorder: Secondary | ICD-10-CM

## 2017-10-04 DIAGNOSIS — R7303 Prediabetes: Secondary | ICD-10-CM

## 2017-10-04 DIAGNOSIS — I739 Peripheral vascular disease, unspecified: Secondary | ICD-10-CM

## 2017-10-04 DIAGNOSIS — F17200 Nicotine dependence, unspecified, uncomplicated: Secondary | ICD-10-CM | POA: Diagnosis not present

## 2017-10-04 DIAGNOSIS — E785 Hyperlipidemia, unspecified: Secondary | ICD-10-CM

## 2017-10-04 DIAGNOSIS — R4586 Emotional lability: Secondary | ICD-10-CM

## 2017-10-04 MED ORDER — ALBUTEROL SULFATE HFA 108 (90 BASE) MCG/ACT IN AERS: INHALATION_SPRAY | RESPIRATORY_TRACT | 3 refills | Status: DC

## 2017-10-04 NOTE — Patient Instructions (Signed)
I am referring you for a psychology consultation Please make every effort to go  STOP SMOKING  I will inquire about your plavix need/safety  See me in 3 months

## 2017-10-04 NOTE — Progress Notes (Signed)
Chief Complaint  Patient presents with  . Follow-up    lab rev   Patient is here for routine follow-up.  We reviewed her recent laboratory work.  She has an improvement in her lipid panel since taking her statin.  Results for Tammy Mcpherson, Tammy Mcpherson (MRN [id number]) as of 10/04/2017 12:31  Ref. Range 01/01/2017 13:52 03/30/2017 15:20 09/28/2017 12:01  Total CHOL/HDL Ratio Latest Ref Range: <5.0 (calc) 6.8 (H) 7.4 (H) 4.4  Cholesterol Latest Ref Range: <200 mg/dL 223 (H) 206 (H) 148  HDL Cholesterol Latest Ref Range: >50 mg/dL 33 (L) 28 (L) 34 (L)  LDL (calc) Latest Ref Range: <100 mg/dL 135 (H) NOT CALC 88  Triglycerides Latest Ref Range: <150 mg/dL 273 (H) 421 (H) 164 (H)  She is encouraged to stay on a low-cholesterol diet.  She needs to exercise daily.  She needs to continue to take her statin. We discussed again today her nicotine addiction.  She is unwilling and unable to quit smoking.  She still smokes 1/2 pack a day.  She claims that her "nerves" prevent her from quitting.  Her husband tells me that she has developed violent mood swings and is quite militant about her smoking.  He calls her "bipolar".  I explained to the patient I believe she needs to see a psychiatrist regarding her anxiety and mood.  It is important for her happiness as well as her health and wellness.  She initially refuses stating she cannot talk to strangers.  I told her that I would like for her to go for 1 visit, see how she likes the provider, and at least get medical recommendations regarding her diagnosis and treatment.  She reluctantly agrees to "try".  We had a discussion that her marriage was important and of her husband has noticed her behavior changes, that perhaps both of them could go and talk to a counselor.  She says, in front of her husband, I am not sure I want to stay married. Since I saw her last she has had multiple surgeries.  She has had 2 amputations of her toes.  She had a vascular procedure to improve her  circulation.  She has had a cholecystectomy.  She was placed on Plavix for her circulation.  She had a GI bleed.  She was taken back off of the Plavix.  I told her that I did not believe she should take Plavix until cleared by gastroenterology.   Patient Active Problem List   Diagnosis Date Noted  . Gallbladder mass   . Lesion of liver   . Gastric AVM   . AVM (arteriovenous malformation) of small bowel, acquired ([place])   . Postoperative state 05/09/2017  . Vitamin D deficiency 01/15/2017  . PAD (peripheral artery disease) ([place]) 01/15/2017  . Aortic atherosclerosis ([place]) 01/01/2017  . Gastric ulcer 01/01/2017  . HLD (hyperlipidemia) 01/01/2017  . Dyspnea on effort 01/01/2017  . Calculus of gallbladder without cholecystitis without obstruction 06/22/2016  . Anemia, iron deficiency 05/12/2016  . Cardiomyopathy- etiology not yet determined 05/02/2016  . CAD-Ca++ coronaries on CTA 05/02/2016  . Acute combined systolic and diastolic heart failure ([place]) 04/30/2016  . COPD exacerbation ([place]) 04/30/2016  . Essential hypertension 04/30/2016  . Anemia 04/30/2016  . Tobacco use disorder 04/30/2016    Outpatient Encounter Medications as of 10/04/2017  Medication Sig  . acetaminophen (TYLENOL) 500 MG tablet Take 500 mg by mouth at bedtime as needed for moderate pain.  Marland Kitchen albuterol (PROAIR HFA) 108 (90  Base) MCG/ACT inhaler INHALE 2 PUFFS INTO THE LUNGS EVERY 6 HOURS AS NEEDED FOR WHEEZING OR SHORTNESS OF BREATH.  Marland Kitchen aspirin 81 MG chewable tablet Chew 81 mg by mouth daily.  Marland Kitchen atorvastatin (LIPITOR) 20 MG tablet Take 1 tablet (20 mg total) by mouth daily.  . bisoprolol (ZEBETA) 10 MG tablet Take 1 tablet (10 mg total) by mouth daily.  . cholecalciferol (VITAMIN D) 1000 units tablet Take 1,000 Units by mouth daily.   Marland Kitchen docusate sodium (COLACE) 100 MG capsule Take 1 capsule (100 mg total) by mouth 2 (two) times daily. (Patient taking differently: Take 100 mg by mouth 2 (two) times daily as needed. )  .  furosemide (LASIX) 20 MG tablet Take 1 tablet (20 mg total) by mouth daily. (Patient taking differently: Take 20 mg by mouth daily as needed for fluid. )  . losartan (COZAAR) 100 MG tablet Take 1 tablet (100 mg total) by mouth daily.  . Multiple Vitamin (MULTIVITAMIN WITH MINERALS) TABS tablet Take 1 tablet by mouth daily.  . pantoprazole (PROTONIX) 40 MG tablet Take 1 tablet (40 mg total) by mouth daily.  . traMADol (ULTRAM) 50 MG tablet Take 1 tablet (50 mg total) by mouth every 6 (six) hours as needed for severe pain.   No facility-administered encounter medications on file as of 10/04/2017.     Allergies  Allergen Reactions  . Bee Venom Swelling  . Codeine Anaphylaxis    Tongue swelled  . Penicillins Swelling and Rash    Has patient had a PCN reaction causing immediate rash, facial/tongue/throat swelling, SOB or lightheadedness with hypotension: yes Has patient had a PCN reaction causing severe rash involving mucus membranes or skin necrosis: No Has patient had a PCN reaction that required hospitalization No Has patient had a PCN reaction occurring within the last 10 years: No If all of the above answers are "NO", then may proceed with Cephalosporin use.   [place] [Pseudoephedrine Hcl] Rash    Review of Systems  Constitutional: Negative for activity change, appetite change and unexpected weight change.  HENT: Negative for congestion, dental problem, postnasal drip and rhinorrhea.   Eyes: Negative for redness and visual disturbance.  Respiratory: Positive for cough and shortness of breath.        Chronic  Cardiovascular: Negative for chest pain, palpitations and leg swelling.  Gastrointestinal: Negative for abdominal pain, constipation and diarrhea.  Genitourinary: Negative for difficulty urinating and frequency.  Musculoskeletal: Positive for arthralgias and gait problem. Negative for back pain.       Leg cramps  Neurological: Negative for dizziness and headaches.    Psychiatric/Behavioral: Positive for sleep disturbance. Negative for dysphoric mood. The patient is nervous/anxious.     BP 138/78   Pulse 90   Temp 98.6 F (37 C) (Temporal)   Resp 18   Ht 5\' 5"  (1.651 m)   Wt 157 lb 0.6 oz (71.2 kg)   SpO2 95%   BMI 26.13 kg/m   Physical Exam  Constitutional: She appears well-developed and well-nourished.  Appears older than stated age. Smells of tobacco.   HENT:  Head: Normocephalic and atraumatic.  Right Ear: External ear normal.  Left Ear: External ear normal.  Mouth/Throat: Oropharynx is clear and moist.  Eyes: Conjunctivae are normal. Pupils are equal, round, and reactive to light.  Neck: Normal range of motion.  No bruit  Cardiovascular: Normal rate and regular rhythm.  Pedal pulses diminished  Pulmonary/Chest: Effort normal and breath sounds normal.  Decreased breath sounds,  few rhonchi  Musculoskeletal: Normal range of motion. She exhibits no edema.  Lymphadenopathy:    She has no cervical adenopathy.  Neurological: She is alert.  Psychiatric: She has a normal mood and affect. Her speech is normal.  Patient becomes defensive, somewhat angry when discussing both her smoking and her anger    ASSESSMENT/PLAN:  1. Tobacco use disorder Unwilling to quit.  Intolerant of Wellbutrin.  Nicotine replacement "does not work"  2. Mood swings Per husband. - Ambulatory referral to Psychiatry  3. GAD (generalized anxiety disorder) Patient.  Her stated reason for smoking. - Ambulatory referral to Psychiatry 4.  Hyperlipidemia 5.  PAD 6.  Prediabetes  Patient Instructions  I am referring you for a psychology consultation Please make every effort to go  STOP SMOKING  I will inquire about your plavix need/safety  See me in 3 months    Raylene Everts, MD

## 2017-10-05 ENCOUNTER — Other Ambulatory Visit: Payer: Self-pay

## 2017-10-05 ENCOUNTER — Encounter (HOSPITAL_COMMUNITY): Payer: Self-pay

## 2017-10-05 ENCOUNTER — Inpatient Hospital Stay (HOSPITAL_COMMUNITY): Payer: BLUE CROSS/BLUE SHIELD | Attending: Oncology

## 2017-10-05 VITALS — BP 171/65 | HR 84 | Temp 98.6°F | Resp 18

## 2017-10-05 DIAGNOSIS — D508 Other iron deficiency anemias: Secondary | ICD-10-CM

## 2017-10-05 MED ORDER — CYANOCOBALAMIN 1000 MCG/ML IJ SOLN
1000.0000 ug | Freq: Once | INTRAMUSCULAR | Status: AC
  Administered 2017-10-05: 1000 ug via INTRAMUSCULAR

## 2017-10-05 NOTE — Progress Notes (Signed)
Tammy Mcpherson presents today for injection per MD orders. B12 1,000 mg administered IM in left Upper Arm. Administration without incident. Patient tolerated well.  Treatment given per orders. Patient tolerated it well without problems. Vitals stable and discharged home from clinic ambulatory. Follow up as scheduled.

## 2017-10-05 NOTE — Patient Instructions (Signed)
[facility] at [facility] Discharge Instructions  RECOMMENDATIONS MADE BY THE CONSULTANT AND ANY TEST RESULTS WILL BE SENT TO YOUR REFERRING PHYSICIAN.  B12 injection given Follow up as scheduled.  Thank you for choosing [facility] at [facility] to provide your oncology and hematology care.  To afford each patient quality time with our provider, please arrive at least 15 minutes before your scheduled appointment time.    If you have a lab appointment with the Cancer Center please come in thru the  Main Entrance and check in at the main information desk  You need to re-schedule your appointment should you arrive 10 or more minutes late.  We strive to give you quality time with our providers, and arriving late affects you and other patients whose appointments are after yours.  Also, if you no show three or more times for appointments you may be dismissed from the clinic at the providers discretion.     Again, thank you for choosing [facility].  Our hope is that these requests will decrease the amount of time that you wait before being seen by our physicians.       _____________________________________________________________  Should you have questions after your visit to  Cancer Center, please contact our office at [phone] between the hours of 8:30 a.m. and 4:30 p.m.  Voicemails left after 4:30 p.m. will not be returned until the following business day.  For prescription refill requests, have your pharmacy contact our office.       Resources For Cancer Patients and their Caregivers ? American Cancer Society: Can assist with transportation, wigs, general needs, runs Look Good Feel Better.        1-888-227-6333 ? Cancer Care: Provides financial assistance, online support groups, medication/co-pay assistance.  1-800-813-HOPE (4673) ? [facility] Assists [place] cancer patients and  their families through emotional , educational and financial support.  336-427-4357 ? [place] DSS Where to apply for food stamps, Medicaid and utility assistance. 336-342-1394 ? [facility]: Transportation to medical appointments. 336-347-2287 ? Social Security Administration: May apply for disability if have a Stage IV cancer. 336-342-7796 1-800-772-1213 ? [place] Aging, Disability and Transit Services: Assists with nutrition, care and transit needs. 336-349-2343  Cancer Center Support Programs: @10RELATIVEDAYS@ > Cancer Support Group  2nd Tuesday of the month 1pm-2pm, Journey Room  > Creative Journey  3rd Tuesday of the month 1130am-1pm, Journey Room  > Look Good Feel Better  1st Wednesday of the month 10am-12 noon, Journey Room (Call American Cancer Society to register 1-800-395-5775)   

## 2017-10-09 ENCOUNTER — Telehealth: Payer: Self-pay | Admitting: Family Medicine

## 2017-10-09 NOTE — Telephone Encounter (Signed)
I have sent an email to her gastroenterologist and I am awaiting a response

## 2017-10-09 NOTE — Telephone Encounter (Signed)
Called Tammy Mcpherson, aware.

## 2017-10-09 NOTE — Telephone Encounter (Signed)
-----   Message from Raylene Everts, MD sent at 10/09/2017 12:53 PM EST ----- Call Butch Penny , she may go back on her Plavix

## 2017-10-09 NOTE — Telephone Encounter (Signed)
Patient left message wanting to know she should start back taking Plavix?

## 2017-10-26 ENCOUNTER — Telehealth: Payer: Self-pay | Admitting: Family Medicine

## 2017-10-26 NOTE — Telephone Encounter (Signed)
Called patient regarding message below. No answer, left generic message for patient to return call.   

## 2017-10-26 NOTE — Telephone Encounter (Signed)
Patient called in requesting information about over the counter medication recommended for headache and stopped up nose. Cb#: [phone]

## 2017-10-26 NOTE — Telephone Encounter (Signed)
Her history of high bp, what medications are okay to recommend.

## 2017-10-26 NOTE — Telephone Encounter (Signed)
She can use chloricedin HBP. This is an OTC cold medication for patients with high BP.

## 2017-10-29 NOTE — Progress Notes (Deleted)
Psychiatric Initial Adult Assessment   Patient Identification: Tammy Mcpherson MRN:  [id number] Date of Evaluation:  10/29/2017 Referral Source: *** Chief Complaint:   Visit Diagnosis: No diagnosis found.  History of Present Illness:   Tammy Mcpherson is a 62 y.o. year old adult with a history of anxiety, cardiomyopathy, hypertension, GERD, PSVT, hyperlipidemia, who presents for follow up appointment for No diagnosis found.    Associated Signs/Symptoms: Depression Symptoms:  {DEPRESSION SYMPTOMS:20000} (Hypo) Manic Symptoms:  {BHH MANIC SYMPTOMS:22872} Anxiety Symptoms:  {BHH ANXIETY SYMPTOMS:22873} Psychotic Symptoms:  {BHH PSYCHOTIC SYMPTOMS:22874} PTSD Symptoms: {BHH PTSD SYMPTOMS:22875}  Past Psychiatric History:  Outpatient:  Psychiatry admission:  Previous suicide attempt:  Past trials of medication:  History of violence:   Previous Psychotropic Medications: {YES/NO:21197}  Substance Abuse History in the last 12 months:  {yes no:314532}  Consequences of Substance Abuse: {BHH CONSEQUENCES OF SUBSTANCE ABUSE:22880}  Past Medical History:  Past Medical History:  Diagnosis Date  . Anemia 04/30/2016  . Cardiomyopathy [facility])    Diagnosed July 2017  . Coronary artery calcification seen on CT scan   . Essential hypertension   . GERD (gastroesophageal reflux disease)   . History of bronchitis   . History of GI bleed   . Hyperlipidemia   . Iron deficiency anemia 05/12/2016   Bleeding ulcers  . Pollen allergy   . PSVT (paroxysmal supraventricular tachycardia) ([place])   . PVC's (premature ventricular contractions)     Past Surgical History:  Procedure Laterality Date  . ABDOMINAL HYSTERECTOMY     polyps  about age 15  . Barbie Banner OSTEOTOMY Right 07/10/2013   Procedure: Barbie Banner OSTEOTOMY RIGHT FOOT;  Surgeon: Marcheta Grammes, DPM;  Location: [facility];  Service: Orthopedics;  Laterality: Right;  . AMPUTATION Left 05/09/2017   Procedure: AMPUTATION 5TH TOE LEFT FOOT;  Surgeon:  [name], DPM;  Location: [facility];  Service: Podiatry;  Laterality: Left;  . AMPUTATION Left 06/13/2017   Procedure: AMPUTATION 2ND TOE LEFT FOOT;  Surgeon: [name], DPM;  Location: [facility];  Service: Podiatry;  Laterality: Left;  . BUNIONECTOMY Right 07/10/2013   Procedure: VOGLER BUNIONECTOMY RIGHT FOOT;  Surgeon: Marcheta Grammes, DPM;  Location: [facility];  Service: Orthopedics;  Laterality: Right;  . CHOLECYSTECTOMY N/A 08/22/2017   Procedure: LAPAROSCOPIC CHOLECYSTECTOMY;  Surgeon: Virl Cagey, MD;  Location: [facility];  Service: General;  Laterality: N/A;  . COLONOSCOPY N/A 07/07/2016   Dr. Oneida Alar; redundant left colon, diverticulosis at hepatic flexure, non-bleeding internal hemorrhoids  . ESOPHAGOGASTRODUODENOSCOPY N/A 07/07/2016   Dr. Oneida Alar: many non-bleeding cratered gastric ulcers without stigmata of bleeding in gastric antrum. four 2-3 mm angioectasias without bleeding in duodenal bulb and second portion of duodenum s/p APC. Chroni gastritis on path.   . ESOPHAGOGASTRODUODENOSCOPY N/A 08/03/2017   Dr. Oneida Alar: erosive gastritis, AVMs. Found a single non-bleeding angioectasia in stomach, s/p APC therapy. Four non-bleeding angioectasias in duodenum s/p APC. Non-bleeding erosive gastropathy  . IR ANGIOGRAM EXTREMITY LEFT  04/24/2017  . IR FEM POP ART ATHERECT INC PTA MOD SED  04/24/2017  . IR INFUSION THROMBOL ARTERIAL INITIAL (MS)  04/24/2017  . IR RADIOLOGIST EVAL & MGMT  12/05/2016  . IR US GUIDE VASC ACCESS RIGHT  04/24/2017  . LIVER BIOPSY N/A 08/22/2017   Procedure: LIVER BIOPSY;  Surgeon: Virl Cagey, MD;  Location: [facility];  Service: General;  Laterality: N/A;  . METATARSAL HEAD EXCISION Right 07/10/2013   Procedure: METATARSAL HEAD RESECTION OF DIGITS 2 AND 3 RIGHT FOOT;  Surgeon: Marcheta Grammes, DPM;  Location: [facility];  Service: Orthopedics;  Laterality: Right;  . PROXIMAL INTERPHALANGEAL FUSION (PIP) Right 07/10/2013   Procedure: ARTHRODESIS PIPJ   2ND DIGIT RIGHT FOOT;  Surgeon: Marcheta Grammes, DPM;  Location: [facility];  Service: Orthopedics;  Laterality: Right;    Family Psychiatric History: ***  Family History:  Family History  Adopted: Yes  Problem Relation Age of Onset  . Hypertension Father   . Coronary artery disease Sister   . Ulcers Maternal Grandmother   . Colon cancer Neg Hx        unknown, was adopted    Social History:   Social History   Socioeconomic History  . Marital status: Married    Spouse name: Tammy Mcpherson  . Number of children: 1  . Years of education: 57  . Highest education level: Not on file  Social Needs  . Financial resource strain: Not on file  . Food insecurity - worry: Not on file  . Food insecurity - inability: Not on file  . Transportation needs - medical: Not on file  . Transportation needs - non-medical: Not on file  Occupational History  . Occupation: disabled    Comment: heart  Tobacco Use  . Smoking status: Current Every Day Smoker    Packs/day: 0.25    Years: 37.00    Pack years: 9.25    Types: Cigarettes    Start date: 05/10/1975  . Smokeless tobacco: Never Used  . Tobacco comment: on nicotrol inhalers   Substance and Sexual Activity  . Alcohol use: No  . Drug use: No  . Sexual activity: Yes    Birth control/protection: Surgical  Other Topics Concern  . Not on file  Social History Narrative   Disabled   Lives with husband Tammy Mcpherson   Two dogs    Additional Social History: ***  Allergies:   Allergies  Allergen Reactions  . Bee Venom Swelling  . Codeine Anaphylaxis    Tongue swelled  . Penicillins Swelling and Rash    Has patient had a PCN reaction causing immediate rash, facial/tongue/throat swelling, SOB or lightheadedness with hypotension: No, delayed Has patient had a PCN reaction causing severe rash involving mucus membranes or skin necrosis: No Has patient had a PCN reaction that required hospitalization No Has patient had a PCN reaction occurring within the  last 10 years: No If all of the above answers are "NO", then may proceed with Cephalosporin use.   . Bupropion Other (See Comments)    "Made my skin crawl"  . Sudafed [Pseudoephedrine Hcl] Rash    Metabolic Disorder Labs: Lab Results  Component Value Date   HGBA1C 5.7 (H) 09/28/2017   MPG 117 09/28/2017   MPG 111 01/01/2017   No results found for: PROLACTIN Lab Results  Component Value Date   CHOL 148 09/28/2017   TRIG 164 (H) 09/28/2017   HDL 34 (L) 09/28/2017   CHOLHDL 4.4 09/28/2017   VLDL NOT CALC 03/30/2017   LDLCALC NOT CALC 03/30/2017   LDLCALC 135 (H) 01/01/2017     Current Medications: Current Outpatient Medications  Medication Sig Dispense Refill  . acetaminophen (TYLENOL) 500 MG tablet Take 500 mg by mouth at bedtime as needed for moderate pain.    Marland Kitchen albuterol (PROAIR HFA) 108 (90 Base) MCG/ACT inhaler INHALE 2 PUFFS INTO THE LUNGS EVERY 6 HOURS AS NEEDED FOR WHEEZING OR SHORTNESS OF BREATH. 18 g 3  . aspirin 81 MG chewable tablet Chew 81 mg by  mouth daily.    Marland Kitchen atorvastatin (LIPITOR) 20 MG tablet Take 1 tablet (20 mg total) by mouth daily. 90 tablet 3  . bisoprolol (ZEBETA) 10 MG tablet Take 1 tablet (10 mg total) by mouth daily. 90 tablet 3  . cholecalciferol (VITAMIN D) 1000 units tablet Take 1,000 Units by mouth daily.     . clopidogrel (PLAVIX) 75 MG tablet Take 75 mg by mouth at bedtime.     . docusate sodium (COLACE) 100 MG capsule Take 1 capsule (100 mg total) by mouth 2 (two) times daily. (Patient taking differently: Take 100 mg by mouth 2 (two) times daily as needed. ) 60 capsule 2  . furosemide (LASIX) 20 MG tablet Take 1 tablet (20 mg total) by mouth daily. (Patient taking differently: Take 20 mg by mouth daily as needed for fluid. ) 30 tablet 6  . losartan (COZAAR) 100 MG tablet Take 1 tablet (100 mg total) by mouth daily. 90 tablet 3  . Multiple Vitamin (MULTIVITAMIN WITH MINERALS) TABS tablet Take 1 tablet by mouth daily.    . pantoprazole  (PROTONIX) 40 MG tablet Take 1 tablet (40 mg total) by mouth daily. 90 tablet 3  . traMADol (ULTRAM) 50 MG tablet Take 1 tablet (50 mg total) by mouth every 6 (six) hours as needed for severe pain. 20 tablet 0   No current facility-administered medications for this visit.     Neurologic: Headache: No Seizure: No Paresthesias:No  Musculoskeletal: Strength & Muscle Tone: within normal limits Gait & Station: normal Patient leans: N/A  Psychiatric Specialty Exam: ROS  There were no vitals taken for this visit.There is no height or weight on file to calculate BMI.  General Appearance: Fairly Groomed  Eye Contact:  Good  Speech:  Clear and Coherent  Volume:  Normal  Mood:  {BHH MOOD:22306}  Affect:  {Affect (PAA):22687}  Thought Process:  Coherent and Goal Directed  Orientation:  Full (Time, Place, and Person)  Thought Content:  Logical  Suicidal Thoughts:  {ST/HT (PAA):22692}  Homicidal Thoughts:  {ST/HT (PAA):22692}  Memory:  Immediate;   Good Recent;   Good Remote;   Good  Judgement:  {Judgement (PAA):22694}  Insight:  {Insight (PAA):22695}  Psychomotor Activity:  Normal  Concentration:  Concentration: Good and Attention Span: Good  Recall:  Good  Fund of Knowledge:Good  Language: Good  Akathisia:  No  Handed:  Right  AIMS (if indicated):  N/A  Assets:  Communication Skills Desire for Improvement  ADL's:  Intact  Cognition: WNL  Sleep:  ***   Assessment  Plan  The patient demonstrates the following risk factors for suicide: Chronic risk factors for suicide include: {Chronic Risk Factors for XIPJASN:05397673}. Acute risk factors for suicide include: {Acute Risk Factors for ALPFXTK:24097353}. Protective factors for this patient include: {Protective Factors for Suicide GDJM:42683419}. Considering these factors, the overall suicide risk at this point appears to be {Desc; low/moderate/high:110033}. Patient {ACTION; IS/IS QQI:29798921} appropriate for outpatient follow  up.   Treatment Plan Summary: Plan as above   Norman Clay, MD 1/28/201911:47 AM

## 2017-10-29 NOTE — Telephone Encounter (Signed)
Pt lvm that she missed our call wanted to know what she could take. I called back and advised of the message from Dr Mannie Stabile

## 2017-10-30 ENCOUNTER — Telehealth: Payer: Self-pay | Admitting: Radiology

## 2017-10-30 ENCOUNTER — Other Ambulatory Visit: Payer: Self-pay | Admitting: Family Medicine

## 2017-10-30 ENCOUNTER — Ambulatory Visit (HOSPITAL_COMMUNITY): Payer: BLUE CROSS/BLUE SHIELD | Admitting: Psychiatry

## 2017-10-30 NOTE — Telephone Encounter (Signed)
clopidogrel (PLAVIX) 75 MG tablet--Please call in a refill

## 2017-10-30 NOTE — Telephone Encounter (Signed)
Patient's pharmacy called requesting refill for Plavix.  Patient has not returned for follow up appointment and [place] as recommended by Dr. Corrie Mckusick.  Although our office has left several messages for patient requesting that she call to schedule, she has not returned our calls.  Therefore, Dr Earleen Newport will not refill this prescription.  Per Dr. Earleen Newport:  If patient is willing to come in for her follow up appointments, he will discuss his recommendations with her.  Left message on patient's voice mail to advise her.    [place] Levasy, RN 10/30/2017 10:10 AM

## 2017-10-31 ENCOUNTER — Ambulatory Visit (HOSPITAL_COMMUNITY): Payer: BLUE CROSS/BLUE SHIELD

## 2017-10-31 ENCOUNTER — Ambulatory Visit (HOSPITAL_COMMUNITY): Payer: BLUE CROSS/BLUE SHIELD | Admitting: Internal Medicine

## 2017-10-31 ENCOUNTER — Other Ambulatory Visit (HOSPITAL_COMMUNITY): Payer: BLUE CROSS/BLUE SHIELD

## 2017-11-05 ENCOUNTER — Other Ambulatory Visit (HOSPITAL_COMMUNITY): Payer: Self-pay | Admitting: *Deleted

## 2017-11-05 DIAGNOSIS — D500 Iron deficiency anemia secondary to blood loss (chronic): Secondary | ICD-10-CM

## 2017-11-06 ENCOUNTER — Other Ambulatory Visit: Payer: Self-pay

## 2017-11-06 ENCOUNTER — Encounter (HOSPITAL_COMMUNITY): Payer: Self-pay | Admitting: Oncology

## 2017-11-06 ENCOUNTER — Inpatient Hospital Stay (HOSPITAL_COMMUNITY): Payer: BLUE CROSS/BLUE SHIELD | Attending: Oncology

## 2017-11-06 ENCOUNTER — Inpatient Hospital Stay (HOSPITAL_BASED_OUTPATIENT_CLINIC_OR_DEPARTMENT_OTHER): Payer: BLUE CROSS/BLUE SHIELD | Admitting: Oncology

## 2017-11-06 ENCOUNTER — Inpatient Hospital Stay (HOSPITAL_COMMUNITY): Payer: BLUE CROSS/BLUE SHIELD

## 2017-11-06 DIAGNOSIS — D508 Other iron deficiency anemias: Secondary | ICD-10-CM

## 2017-11-06 DIAGNOSIS — E538 Deficiency of other specified B group vitamins: Secondary | ICD-10-CM | POA: Diagnosis not present

## 2017-11-06 DIAGNOSIS — R5382 Chronic fatigue, unspecified: Secondary | ICD-10-CM | POA: Diagnosis not present

## 2017-11-06 DIAGNOSIS — D500 Iron deficiency anemia secondary to blood loss (chronic): Secondary | ICD-10-CM

## 2017-11-06 DIAGNOSIS — F17210 Nicotine dependence, cigarettes, uncomplicated: Secondary | ICD-10-CM

## 2017-11-06 DIAGNOSIS — D72829 Elevated white blood cell count, unspecified: Secondary | ICD-10-CM | POA: Diagnosis not present

## 2017-11-06 LAB — COMPREHENSIVE METABOLIC PANEL
ALBUMIN: 3.3 g/dL — AB (ref 3.5–5.0)
ALT: 18 U/L (ref 14–54)
AST: 25 U/L (ref 15–41)
Alkaline Phosphatase: 122 U/L (ref 38–126)
Anion gap: 11 (ref 5–15)
BUN: 12 mg/dL (ref 6–20)
CHLORIDE: 105 mmol/L (ref 101–111)
CO2: 20 mmol/L — AB (ref 22–32)
Calcium: 8.7 mg/dL — ABNORMAL LOW (ref 8.9–10.3)
Creatinine, Ser: 0.79 mg/dL (ref 0.44–1.00)
GFR calc Af Amer: >60 mL/min (ref >60)
GLUCOSE: 266 mg/dL — AB (ref 65–99)
Potassium: 4 mmol/L (ref 3.5–5.1)
SODIUM: 136 mmol/L (ref 135–145)
Total Bilirubin: 0.2 mg/dL — ABNORMAL LOW (ref 0.3–1.2)
Total Protein: 6.9 g/dL (ref 6.5–8.1)

## 2017-11-06 LAB — FERRITIN: FERRITIN: 10 ng/mL — AB (ref 11–307)

## 2017-11-06 LAB — CBC WITH DIFFERENTIAL/PLATELET
BASOS ABS: 0 10*3/uL (ref 0.0–0.1)
BASOS PCT: 0 %
EOS ABS: 0.2 10*3/uL (ref 0.0–0.7)
EOS PCT: 1 %
HCT: 37.3 % (ref 36.0–46.0)
Hemoglobin: 11.5 g/dL — ABNORMAL LOW (ref 12.0–15.0)
Lymphocytes Relative: 15 %
Lymphs Abs: 1.9 10*3/uL (ref 0.7–4.0)
MCH: 28.4 pg (ref 26.0–34.0)
MCHC: 30.8 g/dL (ref 30.0–36.0)
MCV: 92.1 fL (ref 78.0–100.0)
MONO ABS: 1.4 10*3/uL — AB (ref 0.1–1.0)
Monocytes Relative: 11 %
Neutro Abs: 9.5 10*3/uL — ABNORMAL HIGH (ref 1.7–7.7)
Neutrophils Relative %: 73 %
PLATELETS: 477 10*3/uL — AB (ref 150–400)
RBC: 4.05 MIL/uL (ref 3.87–5.11)
RDW: 16.2 % — ABNORMAL HIGH (ref 11.5–15.5)
WBC: 13 10*3/uL — AB (ref 4.0–10.5)

## 2017-11-06 LAB — IRON AND TIBC
IRON: 23 ug/dL — AB (ref 28–170)
Saturation Ratios: 5 % — ABNORMAL LOW (ref 10.4–31.8)
TIBC: 434 ug/dL (ref 250–450)
UIBC: 411 ug/dL

## 2017-11-06 LAB — VITAMIN B12: Vitamin B-12: 323 pg/mL (ref 180–914)

## 2017-11-06 MED ORDER — CYANOCOBALAMIN 1000 MCG/ML IJ SOLN
1000.0000 ug | Freq: Once | INTRAMUSCULAR | Status: AC
  Administered 2017-11-06: 1000 ug via INTRAMUSCULAR
  Filled 2017-11-06: qty 1

## 2017-11-06 NOTE — Patient Instructions (Signed)
[facility] Discharge Instructions  RECOMMENDATIONS MADE BY THE CONSULTANT AND ANY TEST RESULTS WILL BE SENT TO YOUR REFERRING PHYSICIAN.  Received Vit B12 injection today. Follow-up as scheduled. Call clinic for any questions or concerns  Thank you for choosing [facility] at [facility] to provide your oncology and hematology care.  To afford each patient quality time with our provider, please arrive at least 15 minutes before your scheduled appointment time.    If you have a lab appointment with the Cancer Center please come in thru the  Main Entrance and check in at the main information desk  You need to re-schedule your appointment should you arrive 10 or more minutes late.  We strive to give you quality time with our providers, and arriving late affects you and other patients whose appointments are after yours.  Also, if you no show three or more times for appointments you may be dismissed from the clinic at the providers discretion.     Again, thank you for choosing [facility].  Our hope is that these requests will decrease the amount of time that you wait before being seen by our physicians.       _____________________________________________________________  Should you have questions after your visit to [facility], please contact our office at [phone] between the hours of 8:30 a.m. and 4:30 p.m.  Voicemails left after 4:30 p.m. will not be returned until the following business day.  For prescription refill requests, have your pharmacy contact our office.       Resources For Cancer Patients and their Caregivers ? American Cancer Society: Can assist with transportation, wigs, general needs, runs Look Good Feel Better.        1-888-227-6333 ? Cancer Care: Provides financial assistance, online support groups, medication/co-pay assistance.  1-800-813-HOPE (4673) ? [facility] Assists [place] cancer patients and their families through emotional , educational and financial support.  336-427-4357 ? [place] DSS Where to apply for food stamps, Medicaid and utility assistance. 336-342-1394 ? [facility]: Transportation to medical appointments. 336-347-2287 ? Social Security Administration: May apply for disability if have a Stage IV cancer. 336-342-7796 1-800-772-1213 ? [place] Aging, Disability and Transit Services: Assists with nutrition, care and transit needs. 336-349-2343  Cancer Center Support Programs: @10RELATIVEDAYS@ > Cancer Support Group  2nd Tuesday of the month 1pm-2pm, Journey Room  > Creative Journey  3rd Tuesday of the month 1130am-1pm, Journey Room  > Look Good Feel Better  1st Wednesday of the month 10am-12 noon, Journey Room (Call American Cancer Society to register 1-800-395-5775)   

## 2017-11-06 NOTE — Progress Notes (Addendum)
[facility] Follow Up Visit:  Patient Care Team: Raylene Everts, MD as PCP - General (Family Medicine) Danie Binder, MD as Consulting Physician (Gastroenterology)  CHIEF COMPLAINTS/PURPOSE OF CONSULTATION:  Iron deficiency anemia  HISTORY OF PRESENTING ILLNESS:  Patient presents today for continued follow-up for her anemia and chronic leukocytosis. Today her hemoglobin has much improved and is 11.5. Her [place] blood cell count is 13.0 and platelets are 477. Electrolytes are normal.     Patient was last seen by Dr. Talbert Cage in October 2018. Blood work indicated vitamin B12 and folate deficiency, low ferritin and severe anemia at 7.8. She received 1 unit PRBCs on 07/20/2017. Additionally she received 2 doses of Feraheme on 11/1 and 08/09/2017.  Patient denied any evidence of blood loss including melena, hematochezia, hematuria, hemoptysis or gingival bleeding. Patient stated that she had been feeling fatigued. Her appetite is 50%. But she denied any chest pain, shortness of breath, abdominal pain. She does endorse some intermittent dizziness.  Patient had an upper endoscopy conducted on 08/03/2017 that showed several nonbleeding angiectasias.   Patient was seen by Dr. Blake Divine for a gallbladder mass. Mass was identified on a CT scan from 9 2017 by the hematologist who was evaluating the patient for chronic looses had leukocytosis and thrombocytosis. It was mentioned again during repeat imaging in July 2018. A laparoscopic cholecystectomy was performed on 08/22/2017. Findings were benign for cancer.    Review of Systems  Constitutional: Positive for appetite change (Getting better per patient) and fatigue (Chronically). Negative for fever and unexpected weight change.  HENT:  Negative.   Eyes: Negative.   Respiratory: Negative.   Cardiovascular: Negative.   Gastrointestinal: Negative.  Negative for abdominal pain, blood in stool, constipation, diarrhea and  nausea.  Endocrine: Negative.   Genitourinary: Negative.    Musculoskeletal: Negative.   Skin: Negative.   Neurological: Negative.   Hematological: Negative.   Psychiatric/Behavioral: Negative.    ROS as per HPI otherwise 12 point ROS is negative.  MEDICAL HISTORY: Past Medical History:  Diagnosis Date  . Anemia 04/30/2016  . Cardiomyopathy [facility])    Diagnosed July 2017  . Coronary artery calcification seen on CT scan   . Essential hypertension   . GERD (gastroesophageal reflux disease)   . History of bronchitis   . History of GI bleed   . Hyperlipidemia   . Iron deficiency anemia 05/12/2016   Bleeding ulcers  . Pollen allergy   . PSVT (paroxysmal supraventricular tachycardia) ([place])   . PVC's (premature ventricular contractions)     SURGICAL HISTORY: Past Surgical History:  Procedure Laterality Date  . ABDOMINAL HYSTERECTOMY     polyps  about age 38  . Barbie Banner OSTEOTOMY Right 07/10/2013   Procedure: Barbie Banner OSTEOTOMY RIGHT FOOT;  Surgeon: Marcheta Grammes, DPM;  Location: [facility];  Service: Orthopedics;  Laterality: Right;  . AMPUTATION Left 05/09/2017   Procedure: AMPUTATION 5TH TOE LEFT FOOT;  Surgeon: [name], DPM;  Location: [facility];  Service: Podiatry;  Laterality: Left;  . AMPUTATION Left 06/13/2017   Procedure: AMPUTATION 2ND TOE LEFT FOOT;  Surgeon: [name], DPM;  Location: [facility];  Service: Podiatry;  Laterality: Left;  . BUNIONECTOMY Right 07/10/2013   Procedure: VOGLER BUNIONECTOMY RIGHT FOOT;  Surgeon: Marcheta Grammes, DPM;  Location: [facility];  Service: Orthopedics;  Laterality: Right;  . CHOLECYSTECTOMY N/A 08/22/2017   Procedure: LAPAROSCOPIC CHOLECYSTECTOMY;  Surgeon: Virl Cagey, MD;  Location: [facility];  Service: General;  Laterality: N/A;  . COLONOSCOPY N/A 07/07/2016   Dr. Oneida Alar; redundant left colon, diverticulosis at hepatic flexure, non-bleeding internal hemorrhoids  . ESOPHAGOGASTRODUODENOSCOPY N/A 07/07/2016   Dr.  Oneida Alar: many non-bleeding cratered gastric ulcers without stigmata of bleeding in gastric antrum. four 2-3 mm angioectasias without bleeding in duodenal bulb and second portion of duodenum s/p APC. Chroni gastritis on path.   . ESOPHAGOGASTRODUODENOSCOPY N/A 08/03/2017   Dr. Oneida Alar: erosive gastritis, AVMs. Found a single non-bleeding angioectasia in stomach, s/p APC therapy. Four non-bleeding angioectasias in duodenum s/p APC. Non-bleeding erosive gastropathy  . IR ANGIOGRAM EXTREMITY LEFT  04/24/2017  . IR FEM POP ART ATHERECT INC PTA MOD SED  04/24/2017  . IR INFUSION THROMBOL ARTERIAL INITIAL (MS)  04/24/2017  . IR RADIOLOGIST EVAL & MGMT  12/05/2016  . IR US GUIDE VASC ACCESS RIGHT  04/24/2017  . LIVER BIOPSY N/A 08/22/2017   Procedure: LIVER BIOPSY;  Surgeon: Virl Cagey, MD;  Location: [facility];  Service: General;  Laterality: N/A;  . METATARSAL HEAD EXCISION Right 07/10/2013   Procedure: METATARSAL HEAD RESECTION OF DIGITS 2 AND 3 RIGHT FOOT;  Surgeon: Marcheta Grammes, DPM;  Location: [facility];  Service: Orthopedics;  Laterality: Right;  . PROXIMAL INTERPHALANGEAL FUSION (PIP) Right 07/10/2013   Procedure: ARTHRODESIS PIPJ  2ND DIGIT RIGHT FOOT;  Surgeon: Marcheta Grammes, DPM;  Location: [facility];  Service: Orthopedics;  Laterality: Right;    SOCIAL HISTORY: Social History   Socioeconomic History  . Marital status: Married    Spouse name: [name]  . Number of children: 1  . Years of education: 75  . Highest education level: Not on file  Social Needs  . Financial resource strain: Not on file  . Food insecurity - worry: Not on file  . Food insecurity - inability: Not on file  . Transportation needs - medical: Not on file  . Transportation needs - non-medical: Not on file  Occupational History  . Occupation: disabled    Comment: heart  Tobacco Use  . Smoking status: Current Every Day Smoker    Packs/day: 0.25    Years: 37.00    Pack years: 9.25    Types: Cigarettes     Start date: 05/10/1975  . Smokeless tobacco: Never Used  . Tobacco comment: on nicotrol inhalers   Substance and Sexual Activity  . Alcohol use: No  . Drug use: No  . Sexual activity: Yes    Birth control/protection: Surgical  Other Topics Concern  . Not on file  Social History Narrative   Disabled   Lives with husband [name]   Two dogs    FAMILY HISTORY Family History  Adopted: Yes  Problem Relation Age of Onset  . Hypertension Father   . Coronary artery disease Sister   . Ulcers Maternal Grandmother   . Colon cancer Neg Hx        unknown, was adopted    ALLERGIES:  is allergic to bee venom; codeine; penicillins; bupropion; and sudafed [pseudoephedrine hcl].  MEDICATIONS:  Current Outpatient Medications  Medication Sig Dispense Refill  . acetaminophen (TYLENOL) 500 MG tablet Take 500 mg by mouth at bedtime as needed for moderate pain.    Marland Kitchen albuterol (PROAIR HFA) 108 (90 Base) MCG/ACT inhaler INHALE 2 PUFFS INTO THE LUNGS EVERY 6 HOURS AS NEEDED FOR WHEEZING OR SHORTNESS OF BREATH. 18 g 3  . aspirin 81 MG chewable tablet Chew 81 mg by mouth daily.    Marland Kitchen atorvastatin (LIPITOR) 20 MG tablet  Take 1 tablet (20 mg total) by mouth daily. 90 tablet 3  . bisoprolol (ZEBETA) 10 MG tablet Take 1 tablet (10 mg total) by mouth daily. 90 tablet 3  . cholecalciferol (VITAMIN D) 1000 units tablet Take 1,000 Units by mouth daily.     . clopidogrel (PLAVIX) 75 MG tablet TAKE ONE (1) TABLET EACH DAY 30 tablet 0  . docusate sodium (COLACE) 100 MG capsule Take 1 capsule (100 mg total) by mouth 2 (two) times daily. (Patient taking differently: Take 100 mg by mouth 2 (two) times daily as needed. ) 60 capsule 2  . furosemide (LASIX) 20 MG tablet Take 1 tablet (20 mg total) by mouth daily. (Patient taking differently: Take 20 mg by mouth daily as needed for fluid. ) 30 tablet 6  . losartan (COZAAR) 100 MG tablet Take 1 tablet (100 mg total) by mouth daily. 90 tablet 3  . Multiple Vitamin (MULTIVITAMIN  WITH MINERALS) TABS tablet Take 1 tablet by mouth daily.    . pantoprazole (PROTONIX) 40 MG tablet Take 1 tablet (40 mg total) by mouth daily. 90 tablet 3  . traMADol (ULTRAM) 50 MG tablet Take 1 tablet (50 mg total) by mouth every 6 (six) hours as needed for severe pain. 20 tablet 0   No current facility-administered medications for this visit.     PHYSICAL EXAMINATION:   There were no vitals filed for this visit.  There were no vitals filed for this visit.   Physical Exam Constitutional: Well-developed, well-nourished, and in no distress.   HENT:  Head: Normocephalic and atraumatic.  Mouth/Throat: No oropharyngeal exudate. Mucosa moist. Eyes: Pupils are equal, round, and reactive to light. Conjunctivae are normal. No scleral icterus.  Neck: Normal range of motion. Neck supple. No JVD present.  Cardiovascular: Normal rate, regular rhythm and normal heart sounds.  Exam reveals no gallop and no friction rub.   No murmur heard. Pulmonary/Chest: Effort normal and breath sounds normal. No respiratory distress. No wheezes.No rales.  Abdominal: Soft. Bowel sounds are normal. No distension. There is no tenderness. There is no guarding.  Musculoskeletal: No edema or tenderness.  Lymphadenopathy:    No cervical or supraclavicular adenopathy.  Neurological: Alert and oriented to person, place, and time. No cranial nerve deficit.  Skin: Skin is warm and dry. No rash noted. No erythema. No pallor.  Psychiatric: Affect and judgment normal.    LABORATORY DATA: I have personally reviewed the data as listed:  Lab on 11/06/2017  Component Date Value Ref Range Status  . WBC 11/06/2017 13.0* 4.0 - 10.5 K/uL Final  . RBC 11/06/2017 4.05  3.87 - 5.11 MIL/uL Final  . Hemoglobin 11/06/2017 11.5* 12.0 - 15.0 g/dL Final  . HCT 11/06/2017 37.3  36.0 - 46.0 % Final  . MCV 11/06/2017 92.1  78.0 - 100.0 fL Final  . MCH 11/06/2017 28.4  26.0 - 34.0 pg Final  . MCHC 11/06/2017 30.8  30.0 - 36.0 g/dL  Final  . RDW 11/06/2017 16.2* 11.5 - 15.5 % Final  . Platelets 11/06/2017 477* 150 - 400 K/uL Final  . Neutrophils Relative % 11/06/2017 73  % Final  . Neutro Abs 11/06/2017 9.5* 1.7 - 7.7 K/uL Final  . Lymphocytes Relative 11/06/2017 15  % Final  . Lymphs Abs 11/06/2017 1.9  0.7 - 4.0 K/uL Final  . Monocytes Relative 11/06/2017 11  % Final  . Monocytes Absolute 11/06/2017 1.4* 0.1 - 1.0 K/uL Final  . Eosinophils Relative 11/06/2017 1  % Final  .  Eosinophils Absolute 11/06/2017 0.2  0.0 - 0.7 K/uL Final  . Basophils Relative 11/06/2017 0  % Final  . Basophils Absolute 11/06/2017 0.0  0.0 - 0.1 K/uL Final   Performed at [facility], [address] [place], [place] [address]  . Sodium 11/06/2017 136  135 - 145 mmol/L Final  . Potassium 11/06/2017 4.0  3.5 - 5.1 mmol/L Final  . Chloride 11/06/2017 105  101 - 111 mmol/L Final  . CO2 11/06/2017 20* 22 - 32 mmol/L Final  . Glucose, Bld 11/06/2017 266* 65 - 99 mg/dL Final  . BUN 11/06/2017 12  6 - 20 mg/dL Final  . Creatinine, Ser 11/06/2017 0.79  0.44 - 1.00 mg/dL Final  . Calcium 11/06/2017 8.7* 8.9 - 10.3 mg/dL Final  . Total Protein 11/06/2017 6.9  6.5 - 8.1 g/dL Final  . Albumin 11/06/2017 3.3* 3.5 - 5.0 g/dL Final  . AST 11/06/2017 25  15 - 41 U/L Final  . ALT 11/06/2017 18  14 - 54 U/L Final  . Alkaline Phosphatase 11/06/2017 122  38 - 126 U/L Final  . Total Bilirubin 11/06/2017 0.2* 0.3 - 1.2 mg/dL Final  . GFR calc non Af Amer 11/06/2017 >60  >60 mL/min Final  . GFR calc Af Amer 11/06/2017 >60  >60 mL/min Final   Comment: (NOTE) The eGFR has been calculated using the CKD EPI equation. This calculation has not been validated in all clinical situations. eGFR's persistently <60 mL/min signify possible Chronic Kidney Disease.   Georgiann Hahn gap 11/06/2017 11  5 - 15 Final   Performed at [facility], [address] [place], [place] [address]    RADIOGRAPHIC STUDIES: I have personally reviewed the radiological images as listed and  agree with the findings in the report  No results found.  ASSESSMENT/PLAN 1. Iron deficiency anemia- -Patient last received 2 doses of IV Feraheme at the beginning of November 2018. Has not required iron since then. -Had an upper EGD in November. Negative for active bleeding. Several nonbleeding angio actasias noted. -Iron panel, ferritin and B12 level labs pending during dictation.   2. Mild B12 deficiency -Continue monthly B12 injections.  3. Leukocytosis: -Continue to monitor this. This is an ongoing problem.  4. Thrombocytosis: Platelet count 477 today. This appears to be baseline. Will continue to monitor.  5. Peripheral vascular disease s/p angioplasty: 04/2017: left SFA atherectomy and drug eluting balloon angioplasty of TASC A lesion as the offending lesion contributing to recent atheroembolic event to the left foot. Patient was started on Plavix. She continues to take a daily. 05/2017: gangrene of second toe left foot and fifth toe left foot- amputation of both by Dr. [name].   Dispo: RTC monthly for B12 injections. Return to clinic in 3 months for labs and M.D. assessment. (CBC with diff, iron panel, ferritin, CMET).   Addendum: Iron panel reveals a ferritin of 10 and saturation ratio of 5%.  Patient will require IV iron at this time.  Please set patient up for 1 dose of IV Feraheme 510 mg.  Calculated iron deficit is ~ 381 mg with a target HGB of 14 g/dL.  Supportive therapy plan is built.  No orders of the defined types were placed in this encounter.   All questions were answered. The patient knows to call the clinic with any problems, questions or concerns.  This note was electronically signed.    Jacquelin Hawking, NP  11/06/2017 3:20 PM

## 2017-11-06 NOTE — Progress Notes (Signed)
Tammy Mcpherson tolerated Vit B12 injection well without complaints or incident. Pt discharged self ambulatory in satisfactory condition accompanied by her husband

## 2017-11-06 NOTE — Patient Instructions (Signed)
[place] at [facility] Discharge Instructions  RECOMMENDATIONS MADE BY THE CONSULTANT AND ANY TEST RESULTS WILL BE SENT TO YOUR REFERRING PHYSICIAN.  Today you saw Rulon Abide NP  Thank you for choosing [place] at [facility] to provide your oncology and hematology care.  To afford each patient quality time with our provider, please arrive at least 15 minutes before your scheduled appointment time.    If you have a lab appointment with the [facility] please come in thru the  Main Entrance and check in at the main information desk  You need to re-schedule your appointment should you arrive 10 or more minutes late.  We strive to give you quality time with our providers, and arriving late affects you and other patients whose appointments are after yours.  Also, if you no show three or more times for appointments you may be dismissed from the clinic at the providers discretion.     Again, thank you for choosing [facility].  Our hope is that these requests will decrease the amount of time that you wait before being seen by our physicians.       _____________________________________________________________  Should you have questions after your visit to [facility], please contact our office at [phone] between the hours of 8:30 a.m. and 4:30 p.m.  Voicemails left after 4:30 p.m. will not be returned until the following business day.  For prescription refill requests, have your pharmacy contact our office.       Resources For Cancer Patients and their Caregivers ? American Cancer Society: Can assist with transportation, wigs, general needs, runs Look Good Feel Better.        929-748-8114 ? Cancer Care: Provides financial assistance, online support groups, medication/co-pay assistance.  1-800-813-HOPE (606)079-2234) ? [facility] Assists [place] cancer patients and their families  through emotional , educational and financial support.  (224)675-4022 ? [place] DSS Where to apply for food stamps, Medicaid and utility assistance. 249-472-9264 ? [facility]: Transportation to medical appointments. 360-432-5513 ? Social Security Administration: May apply for disability if have a Stage IV cancer. 315-271-9257 228-166-0726 ? [employer], Disability and Transit Services: Assists with nutrition, care and transit needs. [place] Support Programs: @10RELATIVEDAYS @ > Cancer Support Group  2nd Tuesday of the month 1pm-2pm, Journey Room  > Creative Journey  3rd Tuesday of the month 1130am-1pm, Journey Room  > Look Good Feel Better  1st Wednesday of the month 10am-12 noon, Journey Room (Call [place] to register [phone])

## 2017-11-08 ENCOUNTER — Telehealth (HOSPITAL_COMMUNITY): Payer: Self-pay | Admitting: *Deleted

## 2017-11-08 ENCOUNTER — Other Ambulatory Visit (HOSPITAL_COMMUNITY): Payer: Self-pay | Admitting: Oncology

## 2017-11-08 NOTE — Telephone Encounter (Signed)
I spoke with patient via telephone and advised of lab results. Patient agrees and has been scheduled for feraheme.

## 2017-11-08 NOTE — Telephone Encounter (Signed)
-----   Message from Jacquelin Hawking, NP sent at 11/08/2017 11:52 AM EST ----- With someone be able to call this patient and let her know she needs 1 dose of IV Feraheme.  Plan is already built.  Her iron saturations 5% and her ferritin was 10.  Thanks Sonia Baller

## 2017-11-09 ENCOUNTER — Other Ambulatory Visit: Payer: Self-pay | Admitting: Adult Health

## 2017-11-09 ENCOUNTER — Other Ambulatory Visit: Payer: Self-pay | Admitting: Family Medicine

## 2017-11-12 NOTE — Telephone Encounter (Signed)
Seen 1 3 19 

## 2017-11-13 ENCOUNTER — Inpatient Hospital Stay (HOSPITAL_COMMUNITY): Payer: BLUE CROSS/BLUE SHIELD

## 2017-11-13 ENCOUNTER — Other Ambulatory Visit: Payer: Self-pay

## 2017-11-13 ENCOUNTER — Encounter (HOSPITAL_COMMUNITY): Payer: Self-pay

## 2017-11-13 DIAGNOSIS — D508 Other iron deficiency anemias: Secondary | ICD-10-CM | POA: Diagnosis not present

## 2017-11-13 MED ORDER — SODIUM CHLORIDE 0.9 % IV SOLN
INTRAVENOUS | Status: DC
  Administered 2017-11-13: 14:00:00 via INTRAVENOUS

## 2017-11-13 MED ORDER — SODIUM CHLORIDE 0.9 % IV SOLN
510.0000 mg | Freq: Once | INTRAVENOUS | Status: AC
  Administered 2017-11-13: 510 mg via INTRAVENOUS
  Filled 2017-11-13: qty 17

## 2017-11-13 NOTE — Progress Notes (Signed)
Tolerated infusion w/o adverse reaction.  Alert, in no distress.  VSS.  Discharged ambulatory in c/o spouse.  

## 2017-11-14 IMAGING — CR DG FOOT COMPLETE 3+V*L*
1 series · 3 of 3 positions shown · non-contrast
Comparison: Preoperative films from yesterday.

CLINICAL DATA: Status post amputation of the second toe.

EXAM:
LEFT FOOT - COMPLETE 3+ VIEW

[Series 1: ap · 0.17mm/px · 3 of 3 slices shown]
[im 1/3]
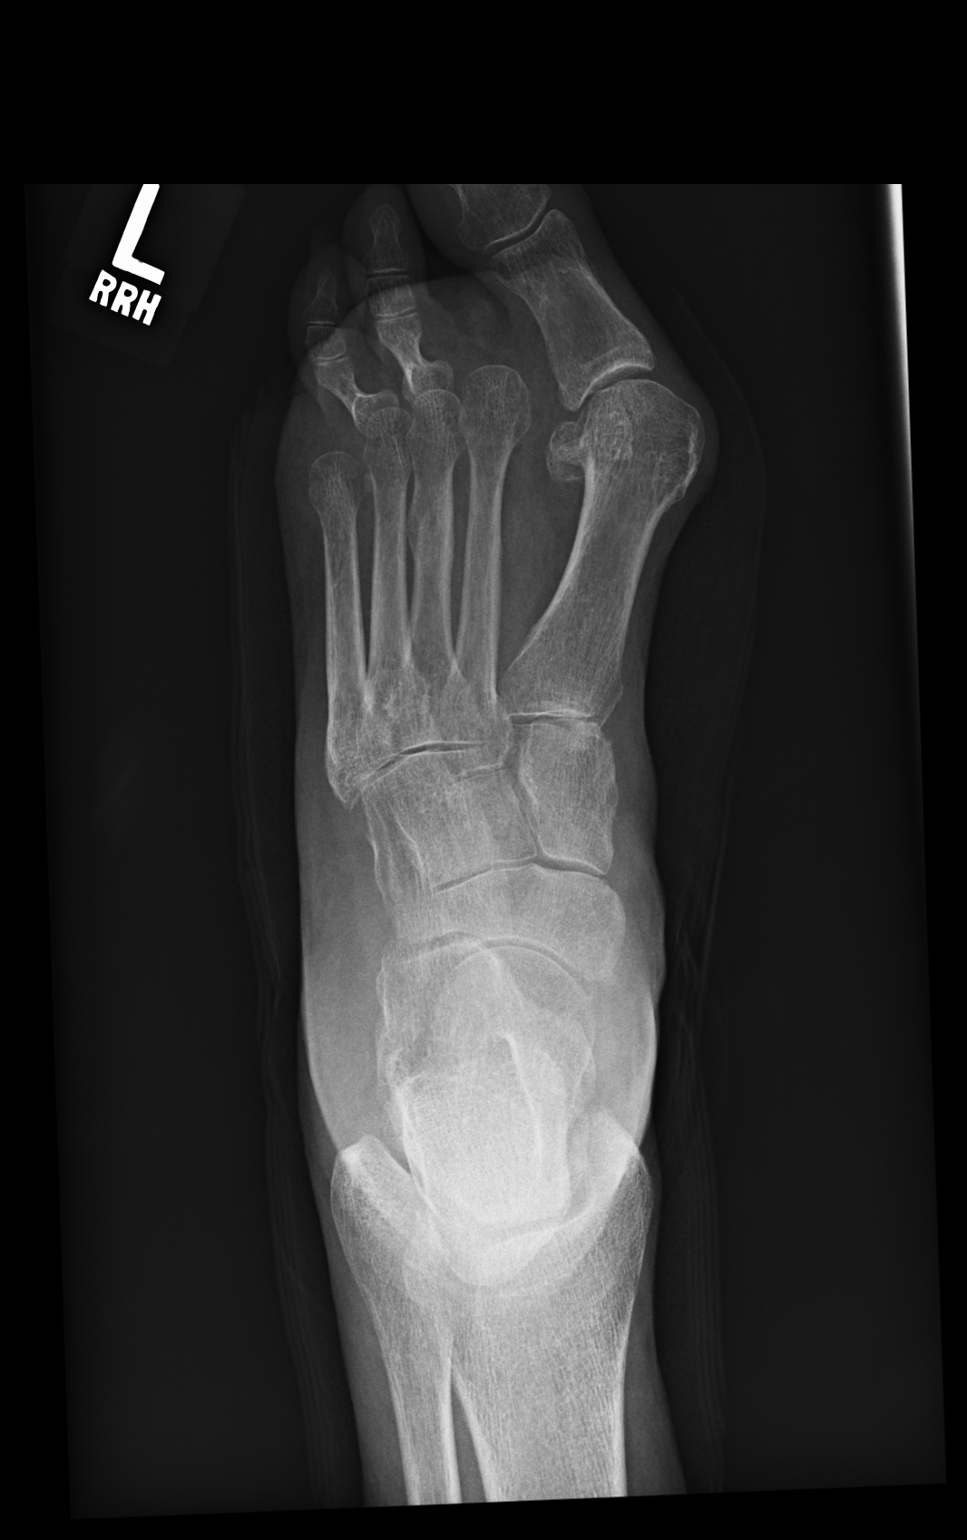
[im 2/3]
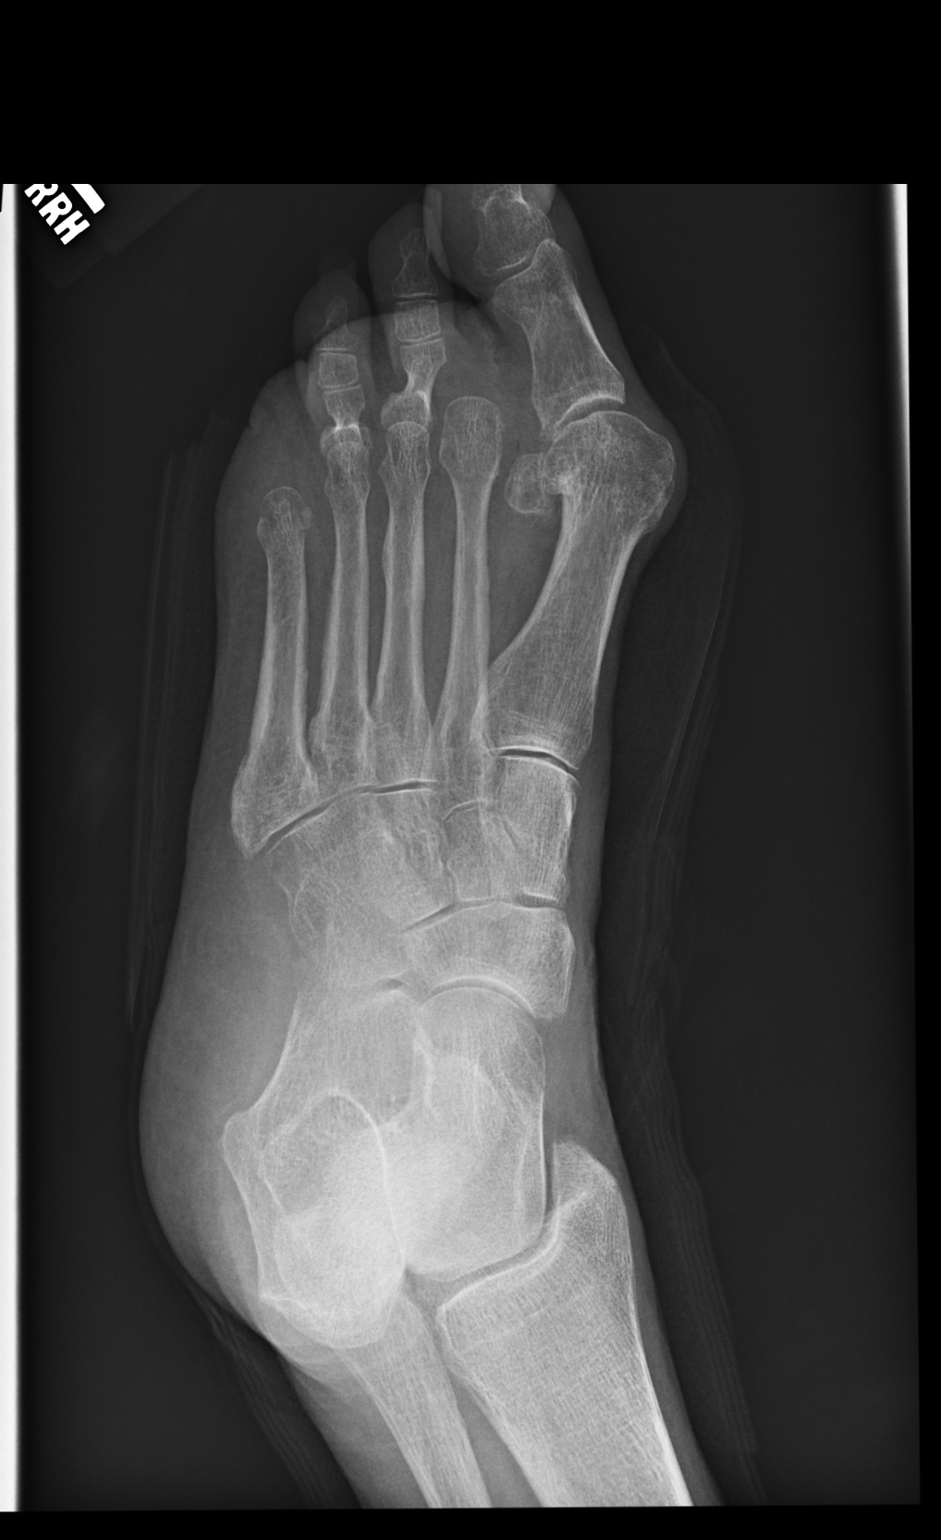
[im 3/3]
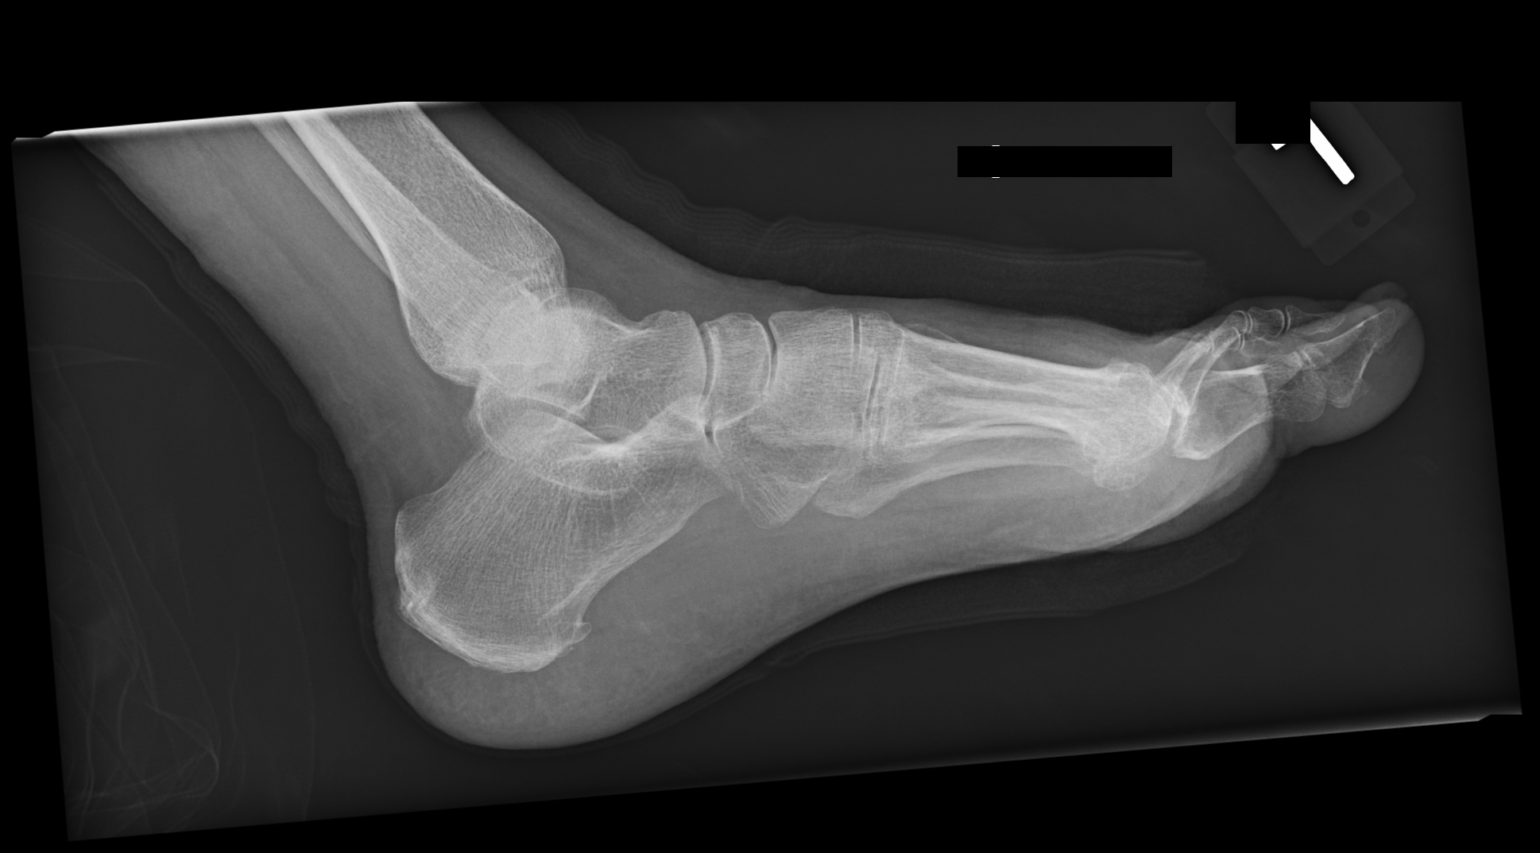

[3 of 3 positions shown; findings below may reference images not displayed]

FINDINGS: Status post amputation of the second toe. No complicating features.
The fifth toe has also been amputated. Again demonstrated is a
significant hallux valgus deformity and degenerative changes at the
first MTP joint.
IMPRESSION: Status post amputation of the second toe.  No complicating features.

## 2017-11-27 ENCOUNTER — Other Ambulatory Visit: Payer: Self-pay | Admitting: Family Medicine

## 2017-11-27 NOTE — Progress Notes (Deleted)
Psychiatric Initial Adult Assessment   Patient Identification: Tammy Mcpherson MRN:  [id number] Date of Evaluation:  11/27/2017 Referral Source: *** Chief Complaint:   Visit Diagnosis: No diagnosis found.  History of Present Illness:   Tammy Mcpherson is a 62 y.o. year old adult with a history of anxiety,cardiomyopathy, hypertension, GERD, PSVT, hyperlipidemia , who is referred for     Associated Signs/Symptoms: Depression Symptoms:  {DEPRESSION SYMPTOMS:20000} (Hypo) Manic Symptoms:  {BHH MANIC SYMPTOMS:22872} Anxiety Symptoms:  {BHH ANXIETY SYMPTOMS:22873} Psychotic Symptoms:  {BHH PSYCHOTIC SYMPTOMS:22874} PTSD Symptoms: {BHH PTSD SYMPTOMS:22875}  Past Psychiatric History:  Outpatient:  Psychiatry admission:  Previous suicide attempt:  Past trials of medication:  History of violence:   Previous Psychotropic Medications: {YES/NO:21197}  Substance Abuse History in the last 12 months:  {yes no:314532}  Consequences of Substance Abuse: {BHH CONSEQUENCES OF SUBSTANCE ABUSE:22880}  Past Medical History:  Past Medical History:  Diagnosis Date  . Anemia 04/30/2016  . Cardiomyopathy [facility])    Diagnosed July 2017  . Coronary artery calcification seen on CT scan   . Essential hypertension   . GERD (gastroesophageal reflux disease)   . History of bronchitis   . History of GI bleed   . Hyperlipidemia   . Iron deficiency anemia 05/12/2016   Bleeding ulcers  . Pollen allergy   . PSVT (paroxysmal supraventricular tachycardia) ([place])   . PVC's (premature ventricular contractions)     Past Surgical History:  Procedure Laterality Date  . ABDOMINAL HYSTERECTOMY     polyps  about age 69  . Barbie Banner OSTEOTOMY Right 07/10/2013   Procedure: Barbie Banner OSTEOTOMY RIGHT FOOT;  Surgeon: Marcheta Grammes, DPM;  Location: [facility];  Service: Orthopedics;  Laterality: Right;  . AMPUTATION Left 05/09/2017   Procedure: AMPUTATION 5TH TOE LEFT FOOT;  Surgeon: [name], DPM;  Location: [facility]  [facility];  Service: Podiatry;  Laterality: Left;  . AMPUTATION Left 06/13/2017   Procedure: AMPUTATION 2ND TOE LEFT FOOT;  Surgeon: [name], DPM;  Location: [facility];  Service: Podiatry;  Laterality: Left;  . BUNIONECTOMY Right 07/10/2013   Procedure: VOGLER BUNIONECTOMY RIGHT FOOT;  Surgeon: Marcheta Grammes, DPM;  Location: [facility];  Service: Orthopedics;  Laterality: Right;  . CHOLECYSTECTOMY N/A 08/22/2017   Procedure: LAPAROSCOPIC CHOLECYSTECTOMY;  Surgeon: Virl Cagey, MD;  Location: [facility];  Service: General;  Laterality: N/A;  . COLONOSCOPY N/A 07/07/2016   Dr. Oneida Alar; redundant left colon, diverticulosis at hepatic flexure, non-bleeding internal hemorrhoids  . ESOPHAGOGASTRODUODENOSCOPY N/A 07/07/2016   Dr. Oneida Alar: many non-bleeding cratered gastric ulcers without stigmata of bleeding in gastric antrum. four 2-3 mm angioectasias without bleeding in duodenal bulb and second portion of duodenum s/p APC. Chroni gastritis on path.   . ESOPHAGOGASTRODUODENOSCOPY N/A 08/03/2017   Dr. Oneida Alar: erosive gastritis, AVMs. Found a single non-bleeding angioectasia in stomach, s/p APC therapy. Four non-bleeding angioectasias in duodenum s/p APC. Non-bleeding erosive gastropathy  . IR ANGIOGRAM EXTREMITY LEFT  04/24/2017  . IR FEM POP ART ATHERECT INC PTA MOD SED  04/24/2017  . IR INFUSION THROMBOL ARTERIAL INITIAL (MS)  04/24/2017  . IR RADIOLOGIST EVAL & MGMT  12/05/2016  . IR US GUIDE VASC ACCESS RIGHT  04/24/2017  . LIVER BIOPSY N/A 08/22/2017   Procedure: LIVER BIOPSY;  Surgeon: Virl Cagey, MD;  Location: [facility];  Service: General;  Laterality: N/A;  . METATARSAL HEAD EXCISION Right 07/10/2013   Procedure: METATARSAL HEAD RESECTION OF DIGITS 2 AND 3 RIGHT FOOT;  Surgeon: Marcheta Grammes, DPM;  Location: [facility];  Service: Orthopedics;  Laterality: Right;  . PROXIMAL INTERPHALANGEAL FUSION (PIP) Right 07/10/2013   Procedure: ARTHRODESIS PIPJ  2ND DIGIT RIGHT FOOT;  Surgeon:  Marcheta Grammes, DPM;  Location: [facility];  Service: Orthopedics;  Laterality: Right;    Family Psychiatric History: ***  Family History:  Family History  Adopted: Yes  Problem Relation Age of Onset  . Hypertension Father   . Coronary artery disease Sister   . Ulcers Maternal Grandmother   . Colon cancer Neg Hx        unknown, was adopted    Social History:   Social History   Socioeconomic History  . Marital status: Married    Spouse name: [name]  . Number of children: 1  . Years of education: 7  . Highest education level: Not on file  Social Needs  . Financial resource strain: Not on file  . Food insecurity - worry: Not on file  . Food insecurity - inability: Not on file  . Transportation needs - medical: Not on file  . Transportation needs - non-medical: Not on file  Occupational History  . Occupation: disabled    Comment: heart  Tobacco Use  . Smoking status: Current Every Day Smoker    Packs/day: 0.25    Years: 37.00    Pack years: 9.25    Types: Cigarettes    Start date: 05/10/1975  . Smokeless tobacco: Never Used  . Tobacco comment: on nicotrol inhalers   Substance and Sexual Activity  . Alcohol use: No  . Drug use: No  . Sexual activity: Yes    Birth control/protection: Surgical  Other Topics Concern  . Not on file  Social History Narrative   Disabled   Lives with husband [name]   Two dogs    Additional Social History: ***  Allergies:   Allergies  Allergen Reactions  . Bee Venom Swelling  . Codeine Anaphylaxis    Tongue swelled  . Penicillins Swelling and Rash    Has patient had a PCN reaction causing immediate rash, facial/tongue/throat swelling, SOB or lightheadedness with hypotension: No, delayed Has patient had a PCN reaction causing severe rash involving mucus membranes or skin necrosis: No Has patient had a PCN reaction that required hospitalization No Has patient had a PCN reaction occurring within the last 10 years: No If all of the  above answers are "NO", then may proceed with Cephalosporin use.   . Bupropion Other (See Comments)    "Made my skin crawl"  . Sudafed [Pseudoephedrine Hcl] Rash    Metabolic Disorder Labs: Lab Results  Component Value Date   HGBA1C 5.7 (H) 09/28/2017   MPG 117 09/28/2017   MPG 111 01/01/2017   No results found for: PROLACTIN Lab Results  Component Value Date   CHOL 148 09/28/2017   TRIG 164 (H) 09/28/2017   HDL 34 (L) 09/28/2017   CHOLHDL 4.4 09/28/2017   VLDL NOT CALC 03/30/2017   LDLCALC NOT CALC 03/30/2017   LDLCALC 135 (H) 01/01/2017     Current Medications: Current Outpatient Medications  Medication Sig Dispense Refill  . acetaminophen (TYLENOL) 500 MG tablet Take 500 mg by mouth at bedtime as needed for moderate pain.    Marland Kitchen albuterol (PROAIR HFA) 108 (90 Base) MCG/ACT inhaler INHALE 2 PUFFS INTO THE LUNGS EVERY 6 HOURS AS NEEDED FOR WHEEZING OR SHORTNESS OF BREATH. 18 g 3  . aspirin 81 MG chewable tablet Chew 81 mg by mouth daily.    Marland Kitchen  atorvastatin (LIPITOR) 20 MG tablet Take 1 tablet (20 mg total) by mouth daily. 90 tablet 3  . bisoprolol (ZEBETA) 10 MG tablet Take 1 tablet (10 mg total) by mouth daily. 90 tablet 3  . cholecalciferol (VITAMIN D) 1000 units tablet Take 1,000 Units by mouth daily.     . clopidogrel (PLAVIX) 75 MG tablet TAKE ONE TABLET ONCE DAILY 30 tablet 0  . docusate sodium (COLACE) 100 MG capsule Take 1 capsule (100 mg total) by mouth 2 (two) times daily. (Patient taking differently: Take 100 mg by mouth 2 (two) times daily as needed. ) 60 capsule 2  . furosemide (LASIX) 20 MG tablet Take 1 tablet (20 mg total) by mouth daily. (Patient taking differently: Take 20 mg by mouth daily as needed for fluid. ) 30 tablet 6  . losartan (COZAAR) 100 MG tablet Take 1 tablet (100 mg total) by mouth daily. 90 tablet 3  . Multiple Vitamin (MULTIVITAMIN WITH MINERALS) TABS tablet Take 1 tablet by mouth daily.    . pantoprazole (PROTONIX) 40 MG tablet Take 1 tablet  (40 mg total) by mouth daily. 90 tablet 3  . spironolactone (ALDACTONE) 25 MG tablet TAKE ONE-HALF TABLET BY MOUTH DAILY. 45 tablet 0  . traMADol (ULTRAM) 50 MG tablet Take 1 tablet (50 mg total) by mouth every 6 (six) hours as needed for severe pain. 20 tablet 0   No current facility-administered medications for this visit.     Neurologic: Headache: No Seizure: No Paresthesias:No  Musculoskeletal: Strength & Muscle Tone: within normal limits Gait & Station: normal Patient leans: N/A  Psychiatric Specialty Exam: ROS  There were no vitals taken for this visit.There is no height or weight on file to calculate BMI.  General Appearance: Fairly Groomed  Eye Contact:  Good  Speech:  Clear and Coherent  Volume:  Normal  Mood:  {BHH MOOD:22306}  Affect:  {Affect (PAA):22687}  Thought Process:  Coherent and Goal Directed  Orientation:  Full (Time, Place, and Person)  Thought Content:  Logical  Suicidal Thoughts:  {ST/HT (PAA):22692}  Homicidal Thoughts:  {ST/HT (PAA):22692}  Memory:  Immediate;   Good Recent;   Good Remote;   Good  Judgement:  {Judgement (PAA):22694}  Insight:  {Insight (PAA):22695}  Psychomotor Activity:  Normal  Concentration:  Concentration: Good and Attention Span: Good  Recall:  Good  Fund of Knowledge:Good  Language: Good  Akathisia:  No  Handed:  Right  AIMS (if indicated):  N/A  Assets:  Communication Skills Desire for Improvement  ADL's:  Intact  Cognition: WNL  Sleep:  ***   Assessment  Plan  The patient demonstrates the following risk factors for suicide: Chronic risk factors for suicide include: {Chronic Risk Factors for QBHALPF:79024097}. Acute risk factors for suicide include: {Acute Risk Factors for DZHGDJM:42683419}. Protective factors for this patient include: {Protective Factors for Suicide QQIW:97989211}. Considering these factors, the overall suicide risk at this point appears to be {Desc; low/moderate/high:110033}. Patient {ACTION;  IS/IS HER:74081448} appropriate for outpatient follow up.   Treatment Plan Summary: Plan as above   Norman Clay, MD 2/26/20192:06 PM

## 2017-11-30 ENCOUNTER — Ambulatory Visit (HOSPITAL_COMMUNITY): Payer: BLUE CROSS/BLUE SHIELD | Admitting: Psychiatry

## 2017-12-04 ENCOUNTER — Inpatient Hospital Stay (HOSPITAL_COMMUNITY): Payer: BLUE CROSS/BLUE SHIELD | Attending: Oncology

## 2017-12-04 ENCOUNTER — Encounter (HOSPITAL_COMMUNITY): Payer: Self-pay

## 2017-12-04 VITALS — BP 168/51 | HR 74 | Temp 98.2°F | Resp 18

## 2017-12-04 DIAGNOSIS — D500 Iron deficiency anemia secondary to blood loss (chronic): Secondary | ICD-10-CM

## 2017-12-04 DIAGNOSIS — D649 Anemia, unspecified: Secondary | ICD-10-CM

## 2017-12-04 DIAGNOSIS — E538 Deficiency of other specified B group vitamins: Secondary | ICD-10-CM | POA: Insufficient documentation

## 2017-12-04 DIAGNOSIS — K31819 Angiodysplasia of stomach and duodenum without bleeding: Secondary | ICD-10-CM

## 2017-12-04 MED ORDER — CYANOCOBALAMIN 1000 MCG/ML IJ SOLN
1000.0000 ug | Freq: Once | INTRAMUSCULAR | Status: AC
  Administered 2017-12-04: 1000 ug via INTRAMUSCULAR
  Filled 2017-12-04: qty 1

## 2017-12-04 NOTE — Patient Instructions (Signed)
[facility]  Discharge Instructions:  You received a b12 shot today.  _______________________________________________________________  Thank you for choosing [facility] at [facility] to provide your oncology and hematology care.  To afford each patient quality time with our providers, please arrive at least 15 minutes before your scheduled appointment.  You need to re-schedule your appointment if you arrive 10 or more minutes late.  We strive to give you quality time with our providers, and arriving late affects you and other patients whose appointments are after yours.  Also, if you no show three or more times for appointments you may be dismissed from the clinic.  Again, thank you for choosing [facility] at [facility]. Our hope is that these requests will allow you access to exceptional care and in a timely manner. _______________________________________________________________  If you have questions after your visit, please contact our office at [phone] between the hours of 8:30 a.m. and 5:00 p.m. Voicemails left after 4:30 p.m. will not be returned until the following business day. _______________________________________________________________  For prescription refill requests, have your pharmacy contact our office. _______________________________________________________________  Recommendations made by the consultant and any test results will be sent to your referring physician. _______________________________________________________________ 

## 2017-12-04 NOTE — Progress Notes (Signed)
Patient tolerated b12 shot with no complaints voiced.  Injection site clean and dry with no bruising or swelling noted at site.  Band aid applied.  VSS with discharge and left ambulatory with family. No s/s of distress noted.  

## 2017-12-10 ENCOUNTER — Encounter: Payer: Self-pay | Admitting: Family Medicine

## 2017-12-31 ENCOUNTER — Other Ambulatory Visit: Payer: Self-pay

## 2017-12-31 ENCOUNTER — Other Ambulatory Visit: Payer: Self-pay | Admitting: Family Medicine

## 2017-12-31 MED ORDER — CLOPIDOGREL BISULFATE 75 MG PO TABS: 75.0000 mg | ORAL_TABLET | Freq: Every day | ORAL | 0 refills | Status: DC

## 2018-01-03 ENCOUNTER — Ambulatory Visit: Payer: BLUE CROSS/BLUE SHIELD | Admitting: Family Medicine

## 2018-01-04 ENCOUNTER — Ambulatory Visit (INDEPENDENT_AMBULATORY_CARE_PROVIDER_SITE_OTHER): Payer: BLUE CROSS/BLUE SHIELD | Admitting: Family Medicine

## 2018-01-04 ENCOUNTER — Other Ambulatory Visit: Payer: Self-pay

## 2018-01-04 ENCOUNTER — Encounter (HOSPITAL_COMMUNITY): Payer: Self-pay

## 2018-01-04 ENCOUNTER — Encounter: Payer: Self-pay | Admitting: Family Medicine

## 2018-01-04 ENCOUNTER — Inpatient Hospital Stay (HOSPITAL_COMMUNITY): Payer: BLUE CROSS/BLUE SHIELD | Attending: Oncology

## 2018-01-04 VITALS — BP 170/82 | HR 77 | Temp 98.4°F | Ht 65.0 in | Wt 161.1 lb

## 2018-01-04 VITALS — BP 185/68 | HR 84 | Temp 97.8°F | Resp 18

## 2018-01-04 DIAGNOSIS — F17200 Nicotine dependence, unspecified, uncomplicated: Secondary | ICD-10-CM

## 2018-01-04 DIAGNOSIS — J441 Chronic obstructive pulmonary disease with (acute) exacerbation: Secondary | ICD-10-CM

## 2018-01-04 DIAGNOSIS — D508 Other iron deficiency anemias: Secondary | ICD-10-CM

## 2018-01-04 DIAGNOSIS — Z1231 Encounter for screening mammogram for malignant neoplasm of breast: Secondary | ICD-10-CM | POA: Diagnosis not present

## 2018-01-04 DIAGNOSIS — E538 Deficiency of other specified B group vitamins: Secondary | ICD-10-CM | POA: Insufficient documentation

## 2018-01-04 DIAGNOSIS — R0609 Other forms of dyspnea: Secondary | ICD-10-CM

## 2018-01-04 DIAGNOSIS — I10 Essential (primary) hypertension: Secondary | ICD-10-CM

## 2018-01-04 DIAGNOSIS — Z1239 Encounter for other screening for malignant neoplasm of breast: Secondary | ICD-10-CM

## 2018-01-04 MED ORDER — IPRATROPIUM-ALBUTEROL 20-100 MCG/ACT IN AERS: 1.0000 | INHALATION_SPRAY | Freq: Four times a day (QID) | RESPIRATORY_TRACT | 11 refills | Status: DC

## 2018-01-04 MED ORDER — CYANOCOBALAMIN 1000 MCG/ML IJ SOLN
1000.0000 ug | Freq: Once | INTRAMUSCULAR | Status: AC
  Administered 2018-01-04: 1000 ug via INTRAMUSCULAR

## 2018-01-04 MED ORDER — CYANOCOBALAMIN 1000 MCG/ML IJ SOLN
INTRAMUSCULAR | Status: AC
Start: 2018-01-04 — End: 2018-01-04
  Filled 2018-01-04: qty 1

## 2018-01-04 NOTE — Progress Notes (Signed)
Chief Complaint  Patient presents with  . Nicotine Dependence    follow up  . Cough    has had x 2 mths   Patient is here for routine follow-up. She states she is compliant with her blood pressure medication, she thinks her blood pressure is elevated because she is upset today. She states that her blood pressure at home is been well controlled.  Her blood pressures taken elsewhere have been well controlled.  She does not want to start a new blood pressure medicine today.  She agrees to come back in 2 weeks for another blood pressure check.  No headache, dizzy spells, visual disturbance, nausea, or symptoms from the elevated blood pressure. She continues to smoke cigarettes.  She feels unable to quit.  She is unwilling to try any additional treatment to quit smoking at this time. At her last visit she was emotionally upset.  We discussed referral to psychiatry.  She states that she gradually improved over the last month that her health is better, her attitude is better, she is sleeping better and she does not feel the need for any additional treatment this is corroborated by her husband who is present. She is having increased shortness of breath and increased cough.  Her only treatment for her COPD currently is as needed albuterol.  She states that she is using it 3 or 4 times a day every day.  She does not want to start "steroids".  I am going to switch her to Combivent and see if this will help her.  Offered her a referral to pulmonary medicine which she declines. She goes to the cancer center regularly to monitor her anemia, she has iron infusions and B12 shots with regular follow-ups. She is concerned about the appearance of the skin on her breasts.  They both have little spider veins across the upper surface.  She wonders if this is an indicator of a problem, or cancer.  She has chronic breast tenderness, but admits that she drinks a lot of caffeine.  She does not do self breast exam.  She is  way overdue for mammogram.  After discussion today, she agrees to a mammogram.  Patient Active Problem List   Diagnosis Date Noted  . Gallbladder mass   . Lesion of liver   . Gastric AVM   . AVM (arteriovenous malformation) of small bowel, acquired   . Vitamin D deficiency 01/15/2017  . PAD (peripheral artery disease) ([place]) 01/15/2017  . Aortic atherosclerosis ([place]) 01/01/2017  . Gastric ulcer 01/01/2017  . HLD (hyperlipidemia) 01/01/2017  . Dyspnea on effort 01/01/2017  . Calculus of gallbladder without cholecystitis without obstruction 06/22/2016  . Anemia, iron deficiency 05/12/2016  . Cardiomyopathy- etiology not yet determined 05/02/2016  . CAD-Ca++ coronaries on CTA 05/02/2016  . Acute combined systolic and diastolic heart failure ([place]) 04/30/2016  . COPD exacerbation ([place]) 04/30/2016  . Essential hypertension 04/30/2016  . Tobacco use disorder 04/30/2016    Outpatient Encounter Medications as of 01/04/2018  Medication Sig  . acetaminophen (TYLENOL) 500 MG tablet Take 500 mg by mouth at bedtime as needed for moderate pain.  Marland Kitchen albuterol (PROAIR HFA) 108 (90 Base) MCG/ACT inhaler INHALE 2 PUFFS INTO THE LUNGS EVERY 6 HOURS AS NEEDED FOR WHEEZING OR SHORTNESS OF BREATH.  Marland Kitchen aspirin 81 MG chewable tablet Chew 81 mg by mouth daily.  Marland Kitchen atorvastatin (LIPITOR) 20 MG tablet Take 1 tablet (20 mg total) by mouth daily.  . bisoprolol (ZEBETA) 10 MG  tablet Take 1 tablet (10 mg total) by mouth daily.  . cholecalciferol (VITAMIN D) 1000 units tablet Take 1,000 Units by mouth daily.   . clopidogrel (PLAVIX) 75 MG tablet Take 1 tablet (75 mg total) by mouth daily.  Marland Kitchen docusate sodium (COLACE) 100 MG capsule Take 1 capsule (100 mg total) by mouth 2 (two) times daily. (Patient taking differently: Take 100 mg by mouth 2 (two) times daily as needed. )  . furosemide (LASIX) 20 MG tablet Take 1 tablet (20 mg total) by mouth daily. (Patient taking differently: Take 20 mg by mouth daily as needed for  fluid. )  . losartan (COZAAR) 100 MG tablet Take 1 tablet (100 mg total) by mouth daily.  . Multiple Vitamin (MULTIVITAMIN WITH MINERALS) TABS tablet Take 1 tablet by mouth daily.  . pantoprazole (PROTONIX) 40 MG tablet Take 1 tablet (40 mg total) by mouth daily.  Marland Kitchen spironolactone (ALDACTONE) 25 MG tablet TAKE ONE-HALF TABLET BY MOUTH DAILY.  Marland Kitchen Ipratropium-Albuterol (COMBIVENT) 20-100 MCG/ACT AERS respimat Inhale 1 puff into the lungs every 6 (six) hours.  . traMADol (ULTRAM) 50 MG tablet Take 1 tablet (50 mg total) by mouth every 6 (six) hours as needed for severe pain. (Patient not taking: Reported on 01/04/2018)   No facility-administered encounter medications on file as of 01/04/2018.     Allergies  Allergen Reactions  . Bee Venom Swelling  . Codeine Anaphylaxis    Tongue swelled  . Penicillins Swelling and Rash    Has patient had a PCN reaction causing immediate rash, facial/tongue/throat swelling, SOB or lightheadedness with hypotension: No, delayed Has patient had a PCN reaction causing severe rash involving mucus membranes or skin necrosis: No Has patient had a PCN reaction that required hospitalization No Has patient had a PCN reaction occurring within the last 10 years: No If all of the above answers are "NO", then may proceed with Cephalosporin use.   . Bupropion Other (See Comments)    "Made my skin crawl"  . Sudafed [Pseudoephedrine Hcl] Rash    Review of Systems  Constitutional: Negative for activity change, appetite change and unexpected weight change.  HENT: Negative for congestion, dental problem, postnasal drip and rhinorrhea.   Eyes: Negative for redness and visual disturbance.  Respiratory: Positive for cough and shortness of breath.        Chronic  Cardiovascular: Negative for chest pain, palpitations and leg swelling.  Gastrointestinal: Negative for abdominal pain, constipation and diarrhea.  Genitourinary: Negative for difficulty urinating and frequency.    Musculoskeletal: Positive for arthralgias and gait problem. Negative for back pain.       Leg cramps  Skin: Positive for color change.       Breasts  Neurological: Negative for dizziness and headaches.  Psychiatric/Behavioral: Positive for sleep disturbance. Negative for dysphoric mood. The patient is nervous/anxious.        Patient states improving      BP (!) 170/82   Pulse 77   Temp 98.4 F (36.9 C) (Oral)   Ht 5\' 5"  (1.651 m)   Wt 161 lb 1.3 oz (73.1 kg)   SpO2 96%   BMI 26.81 kg/m   Physical Exam  Constitutional: She appears well-developed and well-nourished.  Appears older than stated age. Smells of tobacco.   HENT:  Head: Normocephalic and atraumatic.  Right Ear: External ear normal.  Left Ear: External ear normal.  Mouth/Throat: Oropharynx is clear and moist.  Eyes: Pupils are equal, round, and reactive to light. Conjunctivae are  normal.  Neck: Normal range of motion.  No bruit  Cardiovascular: Normal rate and regular rhythm.  Pedal pulses diminished  Pulmonary/Chest: Effort normal and breath sounds normal.  Decreased breath sounds, few rhonchi  Abdominal: Soft. Bowel sounds are normal.  Rounded obese abdomen  Genitourinary:  Genitourinary Comments: Breasts appear symmetric.  No dimpling or retraction.  Examination reveals no mass.  Moderate tenderness throughout.  Axilla are clear.  Musculoskeletal: Normal range of motion. She exhibits no edema.  Lymphadenopathy:    She has no cervical adenopathy.  Neurological: She is alert.  Psychiatric: She has a normal mood and affect. Her speech is normal.  Normal mood and affect today    ASSESSMENT/PLAN:  1. Tobacco use disorder Patient pre-contemplative/not at all interested in discussing cessation today.  2. Dyspnea on effort Chronic.  Related to untreated COPD.  3. COPD exacerbation ([place]) As above.  I am going to switch her from albuterol to Combivent.  She may need Spiriva.  Discussed referral to pulmonary  medicine, declined.  4. Screening for breast cancer Mammogram is ordered - MM Digital Screening; Future  5. Essential hypertension Not well controlled.  Patient will come back in 2 weeks for another blood pressure check.   Patient Instructions  Stop the albuterol 4 times a day Start the combivent in its place It should last longer May use albuterol as needed  Go for mammogram  Follow up with Dr Anastasio Champion as scheduled     Raylene Everts, MD

## 2018-01-04 NOTE — Patient Instructions (Signed)
[facility] Discharge Instructions  Received Vit B12 injection today. Follow-up as scheduled. Call clinic for any questions or concerns   Thank you for choosing [facility] at [facility] to provide your oncology and hematology care.  To afford each patient quality time with our provider, please arrive at least 15 minutes before your scheduled appointment time.   If you have a lab appointment with the Cancer Center please come in thru the  Main Entrance and check in at the main information desk  You need to re-schedule your appointment should you arrive 10 or more minutes late.  We strive to give you quality time with our providers, and arriving late affects you and other patients whose appointments are after yours.  Also, if you no show three or more times for appointments you may be dismissed from the clinic at the providers discretion.     Again, thank you for choosing [facility].  Our hope is that these requests will decrease the amount of time that you wait before being seen by our physicians.       _____________________________________________________________  Should you have questions after your visit to [facility], please contact our office at [phone] between the hours of 8:30 a.m. and 4:30 p.m.  Voicemails left after 4:30 p.m. will not be returned until the following business day.  For prescription refill requests, have your pharmacy contact our office.       Resources For Cancer Patients and their Caregivers ? American Cancer Society: Can assist with transportation, wigs, general needs, runs Look Good Feel Better.        1-888-227-6333 ? Cancer Care: Provides financial assistance, online support groups, medication/co-pay assistance.  1-800-813-HOPE (4673) ? [facility] Assists [place] cancer patients and their families through emotional , educational and  financial support.  336-427-4357 ? [place] DSS Where to apply for food stamps, Medicaid and utility assistance. 336-342-1394 ? [facility]: Transportation to medical appointments. 336-347-2287 ? Social Security Administration: May apply for disability if have a Stage IV cancer. 336-342-7796 1-800-772-1213 ? [place] Aging, Disability and Transit Services: Assists with nutrition, care and transit needs. 336-349-2343  Cancer Center Support Programs:   > Cancer Support Group  2nd Tuesday of the month 1pm-2pm, Journey Room   > Creative Journey  3rd Tuesday of the month 1130am-1pm, Journey Room    

## 2018-01-04 NOTE — Progress Notes (Signed)
Tammy Mcpherson tolerated Vit B12 injection well without complaints or incident. VSS Pt discharged self ambulatory in satisfactory condition accompanied by her husband 

## 2018-01-04 NOTE — Patient Instructions (Addendum)
Stop the albuterol 4 times a day Start the combivent in its place It should last longer May use albuterol as needed  Go for mammogram  Follow up with Dr Anastasio Champion as scheduled

## 2018-01-15 ENCOUNTER — Other Ambulatory Visit: Payer: Self-pay | Admitting: Family Medicine

## 2018-01-21 NOTE — Progress Notes (Deleted)
Psychiatric Initial Adult Assessment   Patient Identification: Tammy Mcpherson MRN:  [id number] Date of Evaluation:  01/21/2018 Referral Source: *** Chief Complaint:   Visit Diagnosis: No diagnosis found.  History of Present Illness:   Tammy Mcpherson is a 62 y.o. year old adult with a history of anxiety, hypertension, cardiomyopathy, PAD, who is referred for mood swing.     Associated Signs/Symptoms: Depression Symptoms:  {DEPRESSION SYMPTOMS:20000} (Hypo) Manic Symptoms:  {BHH MANIC SYMPTOMS:22872} Anxiety Symptoms:  {BHH ANXIETY SYMPTOMS:22873} Psychotic Symptoms:  {BHH PSYCHOTIC SYMPTOMS:22874} PTSD Symptoms: {BHH PTSD SYMPTOMS:22875}  Past Psychiatric History:  Outpatient:  Psychiatry admission:  Previous suicide attempt:  Past trials of medication:  History of violence:   Previous Psychotropic Medications: {YES/NO:21197}  Substance Abuse History in the last 12 months:  {yes no:314532}  Consequences of Substance Abuse: {BHH CONSEQUENCES OF SUBSTANCE ABUSE:22880}  Past Medical History:  Past Medical History:  Diagnosis Date  . Anemia 04/30/2016  . Cardiomyopathy [facility])    Diagnosed July 2017  . Coronary artery calcification seen on CT scan   . Essential hypertension   . GERD (gastroesophageal reflux disease)   . History of bronchitis   . History of GI bleed   . Hyperlipidemia   . Iron deficiency anemia 05/12/2016   Bleeding ulcers  . Pollen allergy   . PSVT (paroxysmal supraventricular tachycardia) ([place])   . PVC's (premature ventricular contractions)     Past Surgical History:  Procedure Laterality Date  . ABDOMINAL HYSTERECTOMY     polyps  about age 24  . Barbie Banner OSTEOTOMY Right 07/10/2013   Procedure: Barbie Banner OSTEOTOMY RIGHT FOOT;  Surgeon: Marcheta Grammes, DPM;  Location: [facility];  Service: Orthopedics;  Laterality: Right;  . AMPUTATION Left 05/09/2017   Procedure: AMPUTATION 5TH TOE LEFT FOOT;  Surgeon: [name], DPM;  Location: [facility];  Service:  Podiatry;  Laterality: Left;  . AMPUTATION Left 06/13/2017   Procedure: AMPUTATION 2ND TOE LEFT FOOT;  Surgeon: [name], DPM;  Location: [facility];  Service: Podiatry;  Laterality: Left;  . BUNIONECTOMY Right 07/10/2013   Procedure: VOGLER BUNIONECTOMY RIGHT FOOT;  Surgeon: Marcheta Grammes, DPM;  Location: [facility];  Service: Orthopedics;  Laterality: Right;  . CHOLECYSTECTOMY N/A 08/22/2017   Procedure: LAPAROSCOPIC CHOLECYSTECTOMY;  Surgeon: Virl Cagey, MD;  Location: [facility];  Service: General;  Laterality: N/A;  . COLONOSCOPY N/A 07/07/2016   Dr. Oneida Alar; redundant left colon, diverticulosis at hepatic flexure, non-bleeding internal hemorrhoids  . ESOPHAGOGASTRODUODENOSCOPY N/A 07/07/2016   Dr. Oneida Alar: many non-bleeding cratered gastric ulcers without stigmata of bleeding in gastric antrum. four 2-3 mm angioectasias without bleeding in duodenal bulb and second portion of duodenum s/p APC. Chroni gastritis on path.   . ESOPHAGOGASTRODUODENOSCOPY N/A 08/03/2017   Dr. Oneida Alar: erosive gastritis, AVMs. Found a single non-bleeding angioectasia in stomach, s/p APC therapy. Four non-bleeding angioectasias in duodenum s/p APC. Non-bleeding erosive gastropathy  . IR ANGIOGRAM EXTREMITY LEFT  04/24/2017  . IR FEM POP ART ATHERECT INC PTA MOD SED  04/24/2017  . IR INFUSION THROMBOL ARTERIAL INITIAL (MS)  04/24/2017  . IR RADIOLOGIST EVAL & MGMT  12/05/2016  . IR US GUIDE VASC ACCESS RIGHT  04/24/2017  . LIVER BIOPSY N/A 08/22/2017   Procedure: LIVER BIOPSY;  Surgeon: Virl Cagey, MD;  Location: [facility];  Service: General;  Laterality: N/A;  . METATARSAL HEAD EXCISION Right 07/10/2013   Procedure: METATARSAL HEAD RESECTION OF DIGITS 2 AND 3 RIGHT FOOT;  Surgeon: Marcheta Grammes, DPM;  Location: [facility];  Service: Orthopedics;  Laterality: Right;  . PROXIMAL INTERPHALANGEAL FUSION (PIP) Right 07/10/2013   Procedure: ARTHRODESIS PIPJ  2ND DIGIT RIGHT FOOT;  Surgeon: Marcheta Grammes, DPM;  Location: [facility];  Service: Orthopedics;  Laterality: Right;    Family Psychiatric History: ***  Family History:  Family History  Adopted: Yes  Problem Relation Age of Onset  . Hypertension Father   . Coronary artery disease Sister   . Ulcers Maternal Grandmother   . Colon cancer Neg Hx        unknown, was adopted    Social History:   Social History   Socioeconomic History  . Marital status: Married    Spouse name: [name]  . Number of children: 1  . Years of education: 41  . Highest education level: Not on file  Occupational History  . Occupation: disabled    Comment: heart  Social Needs  . Financial resource strain: Not on file  . Food insecurity:    Worry: Not on file    Inability: Not on file  . Transportation needs:    Medical: Not on file    Non-medical: Not on file  Tobacco Use  . Smoking status: Current Every Day Smoker    Packs/day: 0.25    Years: 37.00    Pack years: 9.25    Types: Cigarettes    Start date: 05/10/1975  . Smokeless tobacco: Never Used  . Tobacco comment: on nicotrol inhalers   Substance and Sexual Activity  . Alcohol use: No  . Drug use: No  . Sexual activity: Yes    Birth control/protection: Surgical  Lifestyle  . Physical activity:    Days per week: Not on file    Minutes per session: Not on file  . Stress: Not on file  Relationships  . Social connections:    Talks on phone: Not on file    Gets together: Not on file    Attends religious service: Not on file    Active member of club or organization: Not on file    Attends meetings of clubs or organizations: Not on file    Relationship status: Not on file  Other Topics Concern  . Not on file  Social History Narrative   Disabled   Lives with husband [name]   Two dogs    Additional Social History: ***  Allergies:   Allergies  Allergen Reactions  . Bee Venom Swelling  . Codeine Anaphylaxis    Tongue swelled  . Penicillins Swelling and Rash    Has patient  had a PCN reaction causing immediate rash, facial/tongue/throat swelling, SOB or lightheadedness with hypotension: No, delayed Has patient had a PCN reaction causing severe rash involving mucus membranes or skin necrosis: No Has patient had a PCN reaction that required hospitalization No Has patient had a PCN reaction occurring within the last 10 years: No If all of the above answers are "NO", then may proceed with Cephalosporin use.   . Bupropion Other (See Comments)    "Made my skin crawl"  . Sudafed [Pseudoephedrine Hcl] Rash    Metabolic Disorder Labs: Lab Results  Component Value Date   HGBA1C 5.7 (H) 09/28/2017   MPG 117 09/28/2017   MPG 111 01/01/2017   No results found for: PROLACTIN Lab Results  Component Value Date   CHOL 148 09/28/2017   TRIG 164 (H) 09/28/2017   HDL 34 (L) 09/28/2017   CHOLHDL 4.4 09/28/2017   VLDL NOT CALC 03/30/2017  [place] 88 09/28/2017   LDLCALC NOT CALC 03/30/2017     Current Medications: Current Outpatient Medications  Medication Sig Dispense Refill  . acetaminophen (TYLENOL) 500 MG tablet Take 500 mg by mouth at bedtime as needed for moderate pain.    Marland Kitchen albuterol (PROAIR HFA) 108 (90 Base) MCG/ACT inhaler INHALE 2 PUFFS INTO THE LUNGS EVERY 6 HOURS AS NEEDED FOR WHEEZING OR SHORTNESS OF BREATH. 18 g 3  . aspirin 81 MG chewable tablet Chew 81 mg by mouth daily.    Marland Kitchen atorvastatin (LIPITOR) 20 MG tablet Take 1 tablet (20 mg total) by mouth daily. 90 tablet 3  . bisoprolol (ZEBETA) 10 MG tablet Take 1 tablet (10 mg total) by mouth daily. 90 tablet 3  . cholecalciferol (VITAMIN D) 1000 units tablet Take 1,000 Units by mouth daily.     . clopidogrel (PLAVIX) 75 MG tablet Take 1 tablet (75 mg total) by mouth daily. 90 tablet 0  . docusate sodium (COLACE) 100 MG capsule Take 1 capsule (100 mg total) by mouth 2 (two) times daily. (Patient taking differently: Take 100 mg by mouth 2 (two) times daily as needed. ) 60 capsule 2  . furosemide (LASIX)  20 MG tablet Take 1 tablet (20 mg total) by mouth daily. (Patient taking differently: Take 20 mg by mouth daily as needed for fluid. ) 30 tablet 6  . Ipratropium-Albuterol (COMBIVENT) 20-100 MCG/ACT AERS respimat Inhale 1 puff into the lungs every 6 (six) hours. 1 Inhaler 11  . losartan (COZAAR) 100 MG tablet TAKE ONE (1) TABLET BY MOUTH EVERY DAY 90 tablet 0  . Multiple Vitamin (MULTIVITAMIN WITH MINERALS) TABS tablet Take 1 tablet by mouth daily.    . pantoprazole (PROTONIX) 40 MG tablet Take 1 tablet (40 mg total) by mouth daily. 90 tablet 3  . spironolactone (ALDACTONE) 25 MG tablet TAKE ONE-HALF TABLET BY MOUTH DAILY. 45 tablet 0  . traMADol (ULTRAM) 50 MG tablet Take 1 tablet (50 mg total) by mouth every 6 (six) hours as needed for severe pain. (Patient not taking: Reported on 01/04/2018) 20 tablet 0   No current facility-administered medications for this visit.     Neurologic: Headache: No Seizure: No Paresthesias:No  Musculoskeletal: Strength & Muscle Tone: within normal limits Gait & Station: normal Patient leans: N/A  Psychiatric Specialty Exam: ROS  There were no vitals taken for this visit.There is no height or weight on file to calculate BMI.  General Appearance: Fairly Groomed  Eye Contact:  Good  Speech:  Clear and Coherent  Volume:  Normal  Mood:  {BHH MOOD:22306}  Affect:  {Affect (PAA):22687}  Thought Process:  Coherent and Goal Directed  Orientation:  Full (Time, Place, and Person)  Thought Content:  Logical  Suicidal Thoughts:  {ST/HT (PAA):22692}  Homicidal Thoughts:  {ST/HT (PAA):22692}  Memory:  Immediate;   Good  Judgement:  {Judgement (PAA):22694}  Insight:  {Insight (PAA):22695}  Psychomotor Activity:  Normal  Concentration:  Concentration: Good and Attention Span: Good  Recall:  Good  Fund of Knowledge:Good  Language: Good  Akathisia:  No  Handed:  Right  AIMS (if indicated):  N/A  Assets:  Communication Skills Desire for Improvement  ADL's:   Intact  Cognition: WNL  Sleep:  ***   Assessment  Plan  The patient demonstrates the following risk factors for suicide: Chronic risk factors for suicide include: {Chronic Risk Factors for BLTJQZE:09233007}. Acute risk factors for suicide include: {Acute Risk Factors for MAUQJFH:54562563}. Protective factors for this patient include: {  Protective Factors for Suicide EHUD:14970263}. Considering these factors, the overall suicide risk at this point appears to be {Desc; low/moderate/high:110033}. Patient {ACTION; IS/IS ZCH:88502774} appropriate for outpatient follow up.   Treatment Plan Summary: Plan as above   Norman Clay, MD 4/22/201912:57 PM

## 2018-01-23 ENCOUNTER — Ambulatory Visit (HOSPITAL_COMMUNITY): Payer: BLUE CROSS/BLUE SHIELD | Admitting: Psychiatry

## 2018-01-29 ENCOUNTER — Other Ambulatory Visit: Payer: Self-pay | Admitting: Family Medicine

## 2018-01-29 ENCOUNTER — Other Ambulatory Visit: Payer: Self-pay | Admitting: Cardiology

## 2018-01-29 MED ORDER — ATORVASTATIN CALCIUM 20 MG PO TABS: 20.0000 mg | ORAL_TABLET | Freq: Every day | ORAL | 3 refills | Status: DC

## 2018-01-29 NOTE — Telephone Encounter (Signed)
Dr. Meda Coffee is not longer practicing here in [place] and pt is needing a refill on her atorvastatin (LIPITOR) 20 MG tablet [[id number]]  It needs to be sent to [facility]

## 2018-01-29 NOTE — Telephone Encounter (Signed)
Refilled atorvastatin as requested 

## 2018-02-01 ENCOUNTER — Other Ambulatory Visit (HOSPITAL_COMMUNITY): Payer: Self-pay

## 2018-02-01 DIAGNOSIS — D75839 Thrombocytosis, unspecified: Secondary | ICD-10-CM

## 2018-02-01 DIAGNOSIS — D500 Iron deficiency anemia secondary to blood loss (chronic): Secondary | ICD-10-CM

## 2018-02-01 DIAGNOSIS — D649 Anemia, unspecified: Secondary | ICD-10-CM

## 2018-02-01 DIAGNOSIS — D508 Other iron deficiency anemias: Secondary | ICD-10-CM

## 2018-02-01 DIAGNOSIS — D696 Thrombocytopenia, unspecified: Secondary | ICD-10-CM

## 2018-02-01 DIAGNOSIS — D473 Essential (hemorrhagic) thrombocythemia: Secondary | ICD-10-CM

## 2018-02-04 ENCOUNTER — Inpatient Hospital Stay (HOSPITAL_BASED_OUTPATIENT_CLINIC_OR_DEPARTMENT_OTHER): Payer: BLUE CROSS/BLUE SHIELD | Admitting: Hematology

## 2018-02-04 ENCOUNTER — Inpatient Hospital Stay (HOSPITAL_COMMUNITY): Payer: BLUE CROSS/BLUE SHIELD | Attending: Hematology

## 2018-02-04 ENCOUNTER — Inpatient Hospital Stay (HOSPITAL_COMMUNITY): Payer: BLUE CROSS/BLUE SHIELD

## 2018-02-04 ENCOUNTER — Encounter (HOSPITAL_COMMUNITY): Payer: Self-pay | Admitting: Hematology

## 2018-02-04 ENCOUNTER — Other Ambulatory Visit: Payer: Self-pay

## 2018-02-04 DIAGNOSIS — Z7982 Long term (current) use of aspirin: Secondary | ICD-10-CM | POA: Diagnosis not present

## 2018-02-04 DIAGNOSIS — D649 Anemia, unspecified: Secondary | ICD-10-CM

## 2018-02-04 DIAGNOSIS — D500 Iron deficiency anemia secondary to blood loss (chronic): Secondary | ICD-10-CM | POA: Insufficient documentation

## 2018-02-04 DIAGNOSIS — R5382 Chronic fatigue, unspecified: Secondary | ICD-10-CM | POA: Insufficient documentation

## 2018-02-04 DIAGNOSIS — D75839 Thrombocytosis, unspecified: Secondary | ICD-10-CM

## 2018-02-04 DIAGNOSIS — Z79899 Other long term (current) drug therapy: Secondary | ICD-10-CM

## 2018-02-04 DIAGNOSIS — D473 Essential (hemorrhagic) thrombocythemia: Secondary | ICD-10-CM

## 2018-02-04 DIAGNOSIS — D508 Other iron deficiency anemias: Secondary | ICD-10-CM

## 2018-02-04 DIAGNOSIS — D696 Thrombocytopenia, unspecified: Secondary | ICD-10-CM

## 2018-02-04 LAB — CBC WITH DIFFERENTIAL/PLATELET
BASOS PCT: 0 %
Basophils Absolute: 0 10*3/uL (ref 0.0–0.1)
Eosinophils Absolute: 0.2 10*3/uL (ref 0.0–0.7)
Eosinophils Relative: 1 %
HEMATOCRIT: 32.1 % — AB (ref 36.0–46.0)
HEMOGLOBIN: 9.8 g/dL — AB (ref 12.0–15.0)
LYMPHS ABS: 2.5 10*3/uL (ref 0.7–4.0)
LYMPHS PCT: 17 %
MCH: 26.6 pg (ref 26.0–34.0)
MCHC: 30.5 g/dL (ref 30.0–36.0)
MCV: 87 fL (ref 78.0–100.0)
MONO ABS: 1.2 10*3/uL — AB (ref 0.1–1.0)
MONOS PCT: 9 %
NEUTROS ABS: 10.2 10*3/uL — AB (ref 1.7–7.7)
Neutrophils Relative %: 73 %
Platelets: 509 10*3/uL — ABNORMAL HIGH (ref 150–400)
RBC: 3.69 MIL/uL — ABNORMAL LOW (ref 3.87–5.11)
RDW: 16 % — AB (ref 11.5–15.5)
WBC: 14.1 10*3/uL — ABNORMAL HIGH (ref 4.0–10.5)

## 2018-02-04 LAB — COMPREHENSIVE METABOLIC PANEL
ALK PHOS: 111 U/L (ref 38–126)
ALT: 13 U/L — ABNORMAL LOW (ref 14–54)
ANION GAP: 9 (ref 5–15)
AST: 14 U/L — ABNORMAL LOW (ref 15–41)
Albumin: 3.3 g/dL — ABNORMAL LOW (ref 3.5–5.0)
BILIRUBIN TOTAL: 0.3 mg/dL (ref 0.3–1.2)
BUN: 12 mg/dL (ref 6–20)
CHLORIDE: 104 mmol/L (ref 101–111)
CO2: 22 mmol/L (ref 22–32)
Calcium: 8.8 mg/dL — ABNORMAL LOW (ref 8.9–10.3)
Creatinine, Ser: 0.74 mg/dL (ref 0.44–1.00)
GFR calc Af Amer: >60 mL/min (ref >60)
GLUCOSE: 239 mg/dL — AB (ref 65–99)
POTASSIUM: 3.8 mmol/L (ref 3.5–5.1)
Sodium: 135 mmol/L (ref 135–145)
Total Protein: 6.8 g/dL (ref 6.5–8.1)

## 2018-02-04 MED ORDER — CYANOCOBALAMIN 1000 MCG/ML IJ SOLN
1000.0000 ug | Freq: Once | INTRAMUSCULAR | Status: AC
  Administered 2018-02-04: 1000 ug via INTRAMUSCULAR
  Filled 2018-02-04: qty 1

## 2018-02-04 NOTE — Progress Notes (Signed)
Patient Care Team: Danie Binder, MD as Consulting Physician (Gastroenterology)  DIAGNOSIS:  Encounter Diagnosis  Name Primary?  . Iron deficiency anemia due to chronic blood loss      CHIEF COMPLIANT: Follow-up of iron deficiency anemia.  INTERVAL HISTORY: Tammy Mcpherson is a very pleasant 62 year old female seen for follow-up of iron deficiency anemia.  She denies any bleeding per rectum or melena.  However she complains of severe shortness of breath in the last 2 weeks.  She also complains of severe tiredness.  Her last Feraheme infusion was in February 2019.  Denies any infections or hospitalizations.  Denies any new onset pains.  REVIEW OF SYSTEMS:   Constitutional: Denies fevers, chills or abnormal weight loss.  Complains of severe fatigue in the last 2 weeks. Eyes: Denies blurriness of vision Ears, nose, mouth, throat, and face: Denies mucositis or sore throat Respiratory: Denies cough, dyspnea or wheezes.  Has dyspnea on exertion. Cardiovascular: Denies palpitation, chest discomfort Gastrointestinal:  Denies nausea, heartburn or change in bowel habits Skin: Denies abnormal skin rashes Lymphatics: Denies new lymphadenopathy or easy bruising Neurological:Denies numbness, tingling or new weaknesses Behavioral/Psych: Mood is stable, no new changes  Extremities: No lower extremity edema   I have reviewed the past medical history, past surgical history, social history and family history with the patient and they are unchanged from previous note.  ALLERGIES:  is allergic to bee venom; codeine; penicillins; bupropion; and sudafed [pseudoephedrine hcl].  MEDICATIONS:  Current Outpatient Medications  Medication Sig Dispense Refill  . acetaminophen (TYLENOL) 500 MG tablet Take 500 mg by mouth at bedtime as needed for moderate pain.    Marland Kitchen albuterol (PROAIR HFA) 108 (90 Base) MCG/ACT inhaler INHALE 2 PUFFS INTO THE LUNGS EVERY 6 HOURS AS NEEDED FOR WHEEZING OR SHORTNESS OF BREATH.  18 g 3  . aspirin 81 MG chewable tablet Chew 81 mg by mouth daily.    Marland Kitchen atorvastatin (LIPITOR) 20 MG tablet Take 1 tablet (20 mg total) by mouth daily. 90 tablet 3  . bisoprolol (ZEBETA) 10 MG tablet Take 1 tablet (10 mg total) by mouth daily. 90 tablet 3  . cholecalciferol (VITAMIN D) 1000 units tablet Take 1,000 Units by mouth daily.     . clopidogrel (PLAVIX) 75 MG tablet Take 1 tablet (75 mg total) by mouth daily. 90 tablet 0  . docusate sodium (COLACE) 100 MG capsule Take 1 capsule (100 mg total) by mouth 2 (two) times daily. (Patient taking differently: Take 100 mg by mouth 2 (two) times daily as needed. ) 60 capsule 2  . furosemide (LASIX) 20 MG tablet Take 1 tablet (20 mg total) by mouth daily. (Patient taking differently: Take 20 mg by mouth daily as needed for fluid. ) 30 tablet 6  . Ipratropium-Albuterol (COMBIVENT) 20-100 MCG/ACT AERS respimat Inhale 1 puff into the lungs every 6 (six) hours. 1 Inhaler 11  . losartan (COZAAR) 100 MG tablet TAKE ONE (1) TABLET BY MOUTH EVERY DAY 90 tablet 0  . Multiple Vitamin (MULTIVITAMIN WITH MINERALS) TABS tablet Take 1 tablet by mouth daily.    . pantoprazole (PROTONIX) 40 MG tablet Take 1 tablet (40 mg total) by mouth daily. 90 tablet 3  . spironolactone (ALDACTONE) 25 MG tablet TAKE ONE-HALF TABLET BY MOUTH DAILY. 45 tablet 0   No current facility-administered medications for this visit.     PHYSICAL EXAMINATION: ECOG PERFORMANCE STATUS: 1 - Symptomatic but completely ambulatory  Vitals:   02/04/18 1600  BP: (!) 160/65  Pulse: 83  Resp: 20  Temp: 98.4 F (36.9 C)  SpO2: 99%   Filed Weights   02/04/18 1600  Weight: 163 lb (73.9 kg)    GENERAL:alert, no distress and comfortable Rest of the physical exam was deferred.   LABORATORY DATA:  I have reviewed the data as listed CMP Latest Ref Rng & Units 02/04/2018 11/06/2017 09/28/2017  Glucose 65 - 99 mg/dL 239(H) 266(H) 199(H)  BUN 6 - 20 mg/dL 12 12 7   Creatinine 0.44 - 1.00 mg/dL  0.74 0.79 0.73  Sodium 135 - 145 mmol/L 135 136 139  Potassium 3.5 - 5.1 mmol/L 3.8 4.0 4.2  Chloride 101 - 111 mmol/L 104 105 106  CO2 22 - 32 mmol/L 22 20(L) 23  Calcium 8.9 - 10.3 mg/dL 8.8(L) 8.7(L) 9.2  Total Protein 6.5 - 8.1 g/dL 6.8 6.9 6.1  Total Bilirubin 0.3 - 1.2 mg/dL 0.3 0.2(L) 0.2  Alkaline Phos 38 - 126 U/L 111 122 -  AST 15 - 41 U/L 14(L) 25 11  ALT 14 - 54 U/L 13(L) 18 11   No results found for: PYP950   Lab Results  Component Value Date   WBC 14.1 (H) 02/04/2018   HGB 9.8 (L) 02/04/2018   HCT 32.1 (L) 02/04/2018   MCV 87.0 02/04/2018   PLT 509 (H) 02/04/2018   NEUTROABS 10.2 (H) 02/04/2018    ASSESSMENT & PLAN:  Anemia, iron deficiency 1.  Iron deficiency anemia: - Etiology is chronic GI loss from gastric and small bowel AVMs, patient on aspirin and Plavix.  History of having received blood transfusions. -Last Feraheme infusion on 11/13/2017, for a ferritin level of 10. Today hemoglobin is down to 9.8.  Ferritin and iron panel is pending.  She complains of severe tiredness for the past 2 weeks.  She also complains of shortness of breath on exertion.  She denies any bleeding per rectum or melena.  I have recommended to proceed with Feraheme weekly infusions x2.  We will watch her labs closely.  She will be seen back in 2 months for follow-up.  She will likely need maintenance infusions.    The patient has a good understanding of the overall plan. she agrees with it. she will call with any problems that may develop before the next visit here.   Derek Jack, MD 02/04/18

## 2018-02-04 NOTE — Progress Notes (Signed)
Tammy Mcpherson presents today for injection per the provider's orders.  B12 administration without incident; see MAR for injection details.  Patient tolerated procedure well and without incident.  No questions or complaints noted at this time.  Discharged ambulatory.  

## 2018-02-04 NOTE — Assessment & Plan Note (Signed)
1.  Iron deficiency anemia: - Etiology is chronic GI loss from gastric and small bowel AVMs, patient on aspirin and Plavix.  History of having received blood transfusions. -Last Feraheme infusion on 11/13/2017, for a ferritin level of 10. Today hemoglobin is down to 9.8.  Ferritin and iron panel is pending.  She complains of severe tiredness for the past 2 weeks.  She also complains of shortness of breath on exertion.  She denies any bleeding per rectum or melena.  I have recommended to proceed with Feraheme weekly infusions x2.  We will watch her labs closely.  She will be seen back in 2 months for follow-up.  She will likely need maintenance infusions.

## 2018-02-05 LAB — IRON AND TIBC
Iron: 24 ug/dL — ABNORMAL LOW (ref 28–170)
Saturation Ratios: 5 % — ABNORMAL LOW (ref 10.4–31.8)
TIBC: 454 ug/dL — AB (ref 250–450)
UIBC: 430 ug/dL

## 2018-02-05 LAB — FERRITIN: Ferritin: 6 ng/mL — ABNORMAL LOW (ref 11–307)

## 2018-02-06 ENCOUNTER — Other Ambulatory Visit: Payer: Self-pay

## 2018-02-06 ENCOUNTER — Inpatient Hospital Stay (HOSPITAL_COMMUNITY): Payer: BLUE CROSS/BLUE SHIELD

## 2018-02-06 ENCOUNTER — Encounter (HOSPITAL_COMMUNITY): Payer: Self-pay

## 2018-02-06 DIAGNOSIS — D500 Iron deficiency anemia secondary to blood loss (chronic): Secondary | ICD-10-CM | POA: Diagnosis not present

## 2018-02-06 MED ORDER — SODIUM CHLORIDE 0.9 % IV SOLN
510.0000 mg | Freq: Once | INTRAVENOUS | Status: AC
  Administered 2018-02-06: 510 mg via INTRAVENOUS
  Filled 2018-02-06: qty 17

## 2018-02-06 MED ORDER — SODIUM CHLORIDE 0.9 % IV SOLN
INTRAVENOUS | Status: DC
  Administered 2018-02-06: 14:00:00 via INTRAVENOUS

## 2018-02-06 NOTE — Progress Notes (Signed)
Tolerated infusion w/o adverse reaction.  Alert, in no distress.  VSS.  Discharged ambulatory in c/o family.  

## 2018-02-07 ENCOUNTER — Ambulatory Visit: Payer: BLUE CROSS/BLUE SHIELD | Admitting: Gastroenterology

## 2018-02-07 ENCOUNTER — Encounter: Payer: Self-pay | Admitting: Gastroenterology

## 2018-02-07 DIAGNOSIS — K909 Intestinal malabsorption, unspecified: Secondary | ICD-10-CM

## 2018-02-07 DIAGNOSIS — R197 Diarrhea, unspecified: Secondary | ICD-10-CM | POA: Insufficient documentation

## 2018-02-07 DIAGNOSIS — D500 Iron deficiency anemia secondary to blood loss (chronic): Secondary | ICD-10-CM | POA: Diagnosis not present

## 2018-02-07 NOTE — Patient Instructions (Addendum)
YOUR LOW BLOOD COUNT IS DUE TO Arteriovenous Malformation(AVMs), WHICH ARE SUPERFICIAL COLLECTION OF BLOOD VESSELS ON THE SURFACE OF THE SMALL BOWEL THAT CAUSE YOU TO LOSE BLOOD BUT YOU DO NOT SEE ANYTHING COME OUT.  An AVM may occur in the STOMACH, COLON, OR SMALL BOWEL.  CONTINUE TO FOLLOW WITH HEMATOLOGY FOR IV IRON INFUSIONS. YOU WILL NEED THEM FOR THE REST OF YOUR LIFE.   TO PREVENT LOOSE STOOLS AFTER EATING BECAUSE YOU DO NOT HAVE A GALLBLADDER:    1.  FOLLOW A LOW FAT DIET. MEATS SHOULD BE BAKED, BROILED, OR BOILED. AVOID FRIED FOODS.    2. CHEW ONE TUMS WITH MEALS THREE TIMES A DAY TO PREVENT DIARRHEA. IT CAN CAUSE CONSTIPATION.   TO REDUCE LEFT SIDE PAIN:    1. ICE PACK THREE TIMES A DAY FOR 2 WEEKS. DO NOT APPLY ICE PACKS DIRECTLY TO YOUR SKIN.    2. USE ARNICARE OR TURMERIC PILLS FOR LEFT SIDE PAIN.  CONTINUE TO FOLLOW WITH HEMATOLOGY FOR IV IRON INFUSIONS. YOU WILL NEED THEM FOR THE REST OF YOUR LIFE.   FOLLOW UP IN 6 MOS OR NEXT YEAR.  PLEASE CALL WITH QUESTIONS OR CONCERNS.

## 2018-02-07 NOTE — Assessment & Plan Note (Signed)
LOW BLOOD COUNT IS DUE TO Arteriovenous Malformation(AVMs)-NO BRBPR OR MELENA.  CONTINUE TO FOLLOW WITH HEMATOLOGY FOR IV IRON INFUSIONS.  FOLLOW UP IN 6 MOS OR NEXT YEAR.  PLEASE CALL WITH QUESTIONS OR CONCERNS.

## 2018-02-07 NOTE — Progress Notes (Signed)
Subjective:    [name], adult    DOB: [birth year], 62 y.o.   MRN: [id number]  Doree Albee, MD  HPI FEELS COLD ALL THE TIME. WANTS TO KNOW WHY SHE'S LOSING IRON/LOW BLOOD COUNT. SOB: SMOKING 1PK Q2 DAYS, NOT AT BASELINE. BMs: NONE. OCCASIONALLY DIARRHEA AFTER EATING. OCCASIONAL LEFT SIDE PAIN FOR PAST WEEK. HURTS WORSE AT NIGHT THAN DURING THE DAY BUT NOW NOT HURTING. USE COLACE < 1-2X/MO.   PT DENIES FEVER, CHILLS, HEMATOCHEZIA, HEMATEMESIS, nausea, vomiting, melena, CHEST PAIN, SHORTNESS OF BREATH, CHANGE IN BOWEL IN HABITS, constipation, abdominal pain, problems swallowing, OR heartburn or indigestion.  Past Medical History:  Diagnosis Date  . Anemia 04/30/2016  . Cardiomyopathy [facility])    Diagnosed July 2017  . Coronary artery calcification seen on CT scan   . Essential hypertension   . GERD (gastroesophageal reflux disease)   . History of bronchitis   . History of GI bleed   . Hyperlipidemia   . Iron deficiency anemia 05/12/2016   Bleeding ulcers  . Pollen allergy   . PSVT (paroxysmal supraventricular tachycardia) ([place])   . PVC's (premature ventricular contractions)     Past Surgical History:  Procedure Laterality Date  . ABDOMINAL HYSTERECTOMY     polyps  about age 21  . Barbie Banner OSTEOTOMY Right 07/10/2013   Procedure: Barbie Banner OSTEOTOMY RIGHT FOOT;  Surgeon: Marcheta Grammes, DPM;  Location: [facility];  Service: Orthopedics;  Laterality: Right;  . AMPUTATION Left 05/09/2017   Procedure: AMPUTATION 5TH TOE LEFT FOOT;  Surgeon: [name], DPM;  Location: [facility];  Service: Podiatry;  Laterality: Left;  . AMPUTATION Left 06/13/2017   Procedure: AMPUTATION 2ND TOE LEFT FOOT;  Surgeon: [name], DPM;  Location: [facility];  Service: Podiatry;  Laterality: Left;  . BUNIONECTOMY Right 07/10/2013   Procedure: VOGLER BUNIONECTOMY RIGHT FOOT;  Surgeon: Marcheta Grammes, DPM;  Location: [facility];  Service: Orthopedics;  Laterality: Right;  .  CHOLECYSTECTOMY N/A 08/22/2017   Procedure: LAPAROSCOPIC CHOLECYSTECTOMY;  Surgeon: Virl Cagey, MD;  Location: [facility];  Service: General;  Laterality: N/A;  . COLONOSCOPY N/A 07/07/2016   Dr. Oneida Alar; redundant left colon, diverticulosis at hepatic flexure, non-bleeding internal hemorrhoids  . ESOPHAGOGASTRODUODENOSCOPY N/A 07/07/2016   Dr. Oneida Alar: many non-bleeding cratered gastric ulcers without stigmata of bleeding in gastric antrum. four 2-3 mm angioectasias without bleeding in duodenal bulb and second portion of duodenum s/p APC. Chroni gastritis on path.   . ESOPHAGOGASTRODUODENOSCOPY N/A 08/03/2017   Dr. Oneida Alar: erosive gastritis, AVMs. Found a single non-bleeding angioectasia in stomach, s/p APC therapy. Four non-bleeding angioectasias in duodenum s/p APC. Non-bleeding erosive gastropathy  . IR ANGIOGRAM EXTREMITY LEFT  04/24/2017  . IR FEM POP ART ATHERECT INC PTA MOD SED  04/24/2017  . IR INFUSION THROMBOL ARTERIAL INITIAL (MS)  04/24/2017  . IR RADIOLOGIST EVAL & MGMT  12/05/2016  . IR US GUIDE VASC ACCESS RIGHT  04/24/2017  . LIVER BIOPSY N/A 08/22/2017   Procedure: LIVER BIOPSY;  Surgeon: Virl Cagey, MD;  Location: [facility];  Service: General;  Laterality: N/A;  . METATARSAL HEAD EXCISION Right 07/10/2013   Procedure: METATARSAL HEAD RESECTION OF DIGITS 2 AND 3 RIGHT FOOT;  Surgeon: Marcheta Grammes, DPM;  Location: [facility];  Service: Orthopedics;  Laterality: Right;  . PROXIMAL INTERPHALANGEAL FUSION (PIP) Right 07/10/2013   Procedure: ARTHRODESIS PIPJ  2ND DIGIT RIGHT FOOT;  Surgeon: Marcheta Grammes, DPM;  Location: [facility];  Service: Orthopedics;  Laterality: Right;   Allergies  Allergen Reactions  . Bee Venom Swelling  . Codeine Anaphylaxis    Tongue swelled  . Penicillins Swelling and Rash      . Bupropion Other (See Comments)    "Made my skin crawl"  . Sudafed [Pseudoephedrine Hcl] Rash   Current Outpatient Medications  Medication Sig    .  acetaminophen (TYLENOL) 500 MG tablet Take 500 mg by mouth at bedtime as needed for moderate pain.    Marland Kitchen albuterol (PROAIR HFA) 108 (90 Base) MCG/ACT inhaler INHALE 2 PUFFS INTO THE LUNGS EVERY 6 HOURS AS NEEDED FOR WHEEZING OR SHORTNESS OF BREATH.    Marland Kitchen aspirin 81 MG chewable tablet Chew 81 mg by mouth daily.    Marland Kitchen atorvastatin (LIPITOR) 20 MG tablet Take 1 tablet (20 mg total) by mouth daily.    . bisoprolol (ZEBETA) 10 MG tablet Take 1 tablet (10 mg total) by mouth daily.    . cholecalciferol (VITAMIN D) 1000 units tablet Take 1,000 Units by mouth daily.     . clopidogrel (PLAVIX) 75 MG tablet Take 1 tablet (75 mg total) by mouth daily.    . Colace 100 mg BID prn   . furosemide (LASIX) 20 MG tablet Take 1 tablet (20 mg total) by mouth daily.  as needed for fluid. ) PRN   . Ipratropium-Albuterol (COMBIVENT) 20-100 MCG/ACT AERS respimat Inhale 1 puff into the lungs every 6 (six) hours.    Marland Kitchen losartan (COZAAR) 100 MG tablet TAKE ONE (1) TABLET BY MOUTH EVERY DAY    . Multiple Vitamin (MULTIVITAMIN WITH MINERALS) TABS tablet Take 1 tablet by mouth daily.    . pantoprazole (PROTONIX) 40 MG tablet Take 1 tablet (40 mg total) by mouth daily.    Marland Kitchen spironolactone (ALDACTONE) 25 MG tablet  TAKE ONE-HALF TABLET BY MOUTH DAILY. Takes as needed)     Review of Systems PER HPI OTHERWISE ALL SYSTEMS ARE NEGATIVE.    Objective:   Physical Exam  Constitutional: She is oriented to person, place, and time. She appears well-developed and well-nourished. No distress.  HENT:  Head: Normocephalic and atraumatic.  Mouth/Throat: Oropharynx is clear and moist. No oropharyngeal exudate.  Eyes: Pupils are equal, round, and reactive to light. No scleral icterus.  Neck: Normal range of motion. Neck supple.  Cardiovascular: Normal rate, regular rhythm and normal heart sounds.  Pulmonary/Chest: Effort normal and breath sounds normal. No respiratory distress. She exhibits tenderness (LEFT T10-11 RIBS, REPRODUCES LEFT SIDE  COMPLAINT).  Abdominal: Soft. Bowel sounds are normal. She exhibits no distension. There is no tenderness.  Musculoskeletal: She exhibits no edema.  Lymphadenopathy:    She has no cervical adenopathy.  Neurological: She is alert and oriented to person, place, and time.  NO  NEW FOCAL DEFICITS  Psychiatric: She has a normal mood and affect.  Vitals reviewed.     Assessment & Plan:

## 2018-02-07 NOTE — Assessment & Plan Note (Signed)
DUE TO BILE SALT.

## 2018-02-07 NOTE — Progress Notes (Signed)
ON RECALL  °

## 2018-02-13 ENCOUNTER — Inpatient Hospital Stay (HOSPITAL_COMMUNITY): Payer: BLUE CROSS/BLUE SHIELD

## 2018-02-13 ENCOUNTER — Ambulatory Visit (HOSPITAL_COMMUNITY): Payer: BLUE CROSS/BLUE SHIELD

## 2018-02-13 ENCOUNTER — Encounter (HOSPITAL_COMMUNITY): Payer: Self-pay

## 2018-02-13 VITALS — BP 137/54 | HR 81 | Temp 98.4°F | Resp 18 | Wt 164.0 lb

## 2018-02-13 DIAGNOSIS — D500 Iron deficiency anemia secondary to blood loss (chronic): Secondary | ICD-10-CM | POA: Diagnosis not present

## 2018-02-13 DIAGNOSIS — D508 Other iron deficiency anemias: Secondary | ICD-10-CM

## 2018-02-13 DIAGNOSIS — K31819 Angiodysplasia of stomach and duodenum without bleeding: Secondary | ICD-10-CM

## 2018-02-13 MED ORDER — SODIUM CHLORIDE 0.9 % IV SOLN
INTRAVENOUS | Status: DC
  Administered 2018-02-13: 11:00:00 via INTRAVENOUS

## 2018-02-13 MED ORDER — SODIUM CHLORIDE 0.9% FLUSH
10.0000 mL | Freq: Once | INTRAVENOUS | Status: DC
Start: 2018-02-13 — End: 2018-02-13

## 2018-02-13 MED ORDER — SODIUM CHLORIDE 0.9 % IV SOLN
510.0000 mg | Freq: Once | INTRAVENOUS | Status: AC
  Administered 2018-02-13: 510 mg via INTRAVENOUS
  Filled 2018-02-13: qty 17

## 2018-02-13 MED ORDER — SODIUM CHLORIDE 0.9% FLUSH
10.0000 mL | INTRAVENOUS | Status: DC | PRN
  Administered 2018-02-13: 10 mL
  Filled 2018-02-13: qty 10

## 2018-02-13 NOTE — Progress Notes (Signed)
Patient tolerated iron infusion with no complaints.  Good blood return noted before and after administration of iron.  No complaints of pain with peripheral IV site.  Site clean and dry with no bruising or swelling noted at site.  Band aid applied.  VSS with discharge and left ambulatory with family with no s/s of distress noted.

## 2018-02-13 NOTE — Patient Instructions (Signed)
[facility]  Discharge Instructions:  You received an iron infusion today.  Ferumoxytol injection What is this medicine? FERUMOXYTOL is an iron complex. Iron is used to make healthy red blood cells, which carry oxygen and nutrients throughout the body. This medicine is used to treat iron deficiency anemia in people with chronic kidney disease. This medicine may be used for other purposes; ask your health care provider or pharmacist if you have questions. COMMON BRAND NAME(S): Feraheme What should I tell my health care provider before I take this medicine? They need to know if you have any of these conditions: -anemia not caused by low iron levels -high levels of iron in the blood -magnetic resonance imaging (MRI) test scheduled -an unusual or allergic reaction to iron, other medicines, foods, dyes, or preservatives -pregnant or trying to get pregnant -breast-feeding How should I use this medicine? This medicine is for injection into a vein. It is given by a health care professional in a hospital or clinic setting. Talk to your pediatrician regarding the use of this medicine in children. Special care may be needed. Overdosage: If you think you have taken too much of this medicine contact a poison control center or emergency room at once. NOTE: This medicine is only for you. Do not share this medicine with others. What if I miss a dose? It is important not to miss your dose. Call your doctor or health care professional if you are unable to keep an appointment. What may interact with this medicine? This medicine may interact with the following medications: -other iron products This list may not describe all possible interactions. Give your health care provider a list of all the medicines, herbs, non-prescription drugs, or dietary supplements you use. Also tell them if you smoke, drink alcohol, or use illegal drugs. Some items may interact with your  medicine. What should I watch for while using this medicine? Visit your doctor or healthcare professional regularly. Tell your doctor or healthcare professional if your symptoms do not start to get better or if they get worse. You may need blood work done while you are taking this medicine. You may need to follow a special diet. Talk to your doctor. Foods that contain iron include: whole grains/cereals, dried fruits, beans, or peas, leafy green vegetables, and organ meats (liver, kidney). What side effects may I notice from receiving this medicine? Side effects that you should report to your doctor or health care professional as soon as possible: -allergic reactions like skin rash, itching or hives, swelling of the face, lips, or tongue -breathing problems -changes in blood pressure -feeling faint or lightheaded, falls -fever or chills -flushing, sweating, or hot feelings -swelling of the ankles or feet Side effects that usually do not require medical attention (report to your doctor or health care professional if they continue or are bothersome): -diarrhea -headache -nausea, vomiting -stomach pain This list may not describe all possible side effects. Call your doctor for medical advice about side effects. You may report side effects to FDA at 1-800-FDA-1088. Where should I keep my medicine? This drug is given in a hospital or clinic and will not be stored at home. NOTE: This sheet is a summary. It may not cover all possible information. If you have questions about this medicine, talk to your doctor, pharmacist, or health care provider.  2018 Elsevier/Gold Standard (2015-10-21 12:41:49)  _______________________________________________________________  Thank you for choosing [facility] to provide   your oncology and hematology care.  To afford each patient quality time with our providers, please arrive at least 15 minutes before your scheduled  appointment.  You need to re-schedule your appointment if you arrive 10 or more minutes late.  We strive to give you quality time with our providers, and arriving late affects you and other patients whose appointments are after yours.  Also, if you no show three or more times for appointments you may be dismissed from the clinic.  Again, thank you for choosing [facility] at [facility]. Our hope is that these requests will allow you access to exceptional care and in a timely manner. _______________________________________________________________  If you have questions after your visit, please contact our office at [phone] between the hours of 8:30 a.m. and 5:00 p.m. Voicemails left after 4:30 p.m. will not be returned until the following business day. _______________________________________________________________  For prescription refill requests, have your pharmacy contact our office. _______________________________________________________________  Recommendations made by the consultant and any test results will be sent to your referring physician. _______________________________________________________________ 

## 2018-02-28 ENCOUNTER — Encounter: Payer: Self-pay | Admitting: Physician Assistant

## 2018-02-28 ENCOUNTER — Other Ambulatory Visit: Payer: Self-pay

## 2018-02-28 ENCOUNTER — Ambulatory Visit: Payer: BLUE CROSS/BLUE SHIELD | Admitting: Physician Assistant

## 2018-02-28 VITALS — BP 159/85 | HR 105 | Temp 97.6°F | Resp 20 | Ht 62.21 in | Wt 163.0 lb

## 2018-02-28 DIAGNOSIS — I739 Peripheral vascular disease, unspecified: Secondary | ICD-10-CM

## 2018-02-28 DIAGNOSIS — R7303 Prediabetes: Secondary | ICD-10-CM

## 2018-02-28 DIAGNOSIS — Z7689 Persons encountering health services in other specified circumstances: Secondary | ICD-10-CM | POA: Diagnosis not present

## 2018-02-28 DIAGNOSIS — I10 Essential (primary) hypertension: Secondary | ICD-10-CM

## 2018-02-28 DIAGNOSIS — D500 Iron deficiency anemia secondary to blood loss (chronic): Secondary | ICD-10-CM

## 2018-02-28 DIAGNOSIS — E785 Hyperlipidemia, unspecified: Secondary | ICD-10-CM | POA: Diagnosis not present

## 2018-02-28 DIAGNOSIS — K31819 Angiodysplasia of stomach and duodenum without bleeding: Secondary | ICD-10-CM

## 2018-02-28 DIAGNOSIS — F17200 Nicotine dependence, unspecified, uncomplicated: Secondary | ICD-10-CM | POA: Diagnosis not present

## 2018-02-28 DIAGNOSIS — I428 Other cardiomyopathies: Secondary | ICD-10-CM

## 2018-02-28 DIAGNOSIS — K250 Acute gastric ulcer with hemorrhage: Secondary | ICD-10-CM

## 2018-02-28 DIAGNOSIS — J449 Chronic obstructive pulmonary disease, unspecified: Secondary | ICD-10-CM | POA: Diagnosis not present

## 2018-02-28 MED ORDER — HYDROCHLOROTHIAZIDE 12.5 MG PO TABS: 12.5000 mg | ORAL_TABLET | Freq: Every day | ORAL | 0 refills | Status: DC

## 2018-02-28 NOTE — Progress Notes (Signed)
MRN: [id number] DOB: 12/24/1955  Subjective:   Tammy Mcpherson is a 62 y.o. adult presenting for follow up on Hypertension and to establish care. Was last seen by PCP on 01/04/18 and bp was elevated at 170/82. Was told to f/u in 2 weeks. Changing PCPs because her current one is leaving. CMP from 02/04/18 showed glucose of 239. Last A1C 5 mths ago was 5.7. Denies polyuria, polyphagia, polydipsia, dry mouth, blurred vision, and abdominal pain.   Chronic medical conditions: 1) HTN: Takes losartan 175m daily, bisoprolol 137mdaily. Has Rx for lasix 2023mnd spironolactone 69m88mat she uses prn for edema?Patient is not checking blood pressure at home. Denies lightheadedness, dizziness, chronic headache, double vision, chest pain, shortness of breath, heart racing, palpitations, nausea, vomiting, abdominal pain, hematuria, lower leg swelling. Lifestyle: Avoiding excessive salt intake. Does not exercise due to feet issues.    2) HLD: Takes lipitor 20mg83mly  3) Nonischemic cardiomyopathy: Last echo in 08/2017 with grade 1 diastolic dysfunction. Normal EF. Followed closely by cardiology.   4) CAD and 5)PAD: s/p amputation of 2nd and 5th toe of left foot. Takes plavix 75mg 80mASA 81mg d48m.   6) Anemia: Secondary to erosive gastritis/AVMs in setting of ASA/Plavix use. Followed closely by oncology for iron infusions and B12 shots. Last seen 02/13/18  7) PUD and 8) AVM of small bowel: Followed closely by gastro. UTD on endoscopy and colonoscopy. Takes protonix 40mg da70m  9) COPD: Dx ~2 years ago. Cannot remember if she did PFTs. Has baseline DOE and daily cough. Uses albuterol prn. Typically needs it ~1-3 x per day. Was given Rx for combivent last month but it was really expensive so she only uses that if she really needs to.   10) Tobacco use disorder: Smokes 0.5 ppd x 37 years. Not interested in quitting.  Her sig other is present today.    Ivis haDelaneeurrent medication list which includes  the following prescription(s): acetaminophen, albuterol, aspirin, atorvastatin, bisoprolol, cholecalciferol, clopidogrel, docusate sodium, furosemide, ipratropium-albuterol, losartan, multivitamin with minerals, pantoprazole, and spironolactone. Also is allergic to bee venom; codeine; penicillins; bupropion; and sudafed [pseudoephedrine hcl].  Tammy  Mcpherson medical history of Anemia (04/30/2016), Anxiety, Blood transfusion without reported diagnosis, Cardiomyopathy (HCC), [place] heart [place] (HCC), [place]chronic obstructive [place] disease[place] (HCC), [place] artery calcification seen on CT scan, Essential hypertension, GERD (gastroesophageal reflux disease), History of bronchitis, History of GI bleed, Hyperlipidemia, Iron deficiency anemia (05/12/2016), Pollen allergy, PSVT (paroxysmal supraventricular tachycardia) (HCC), anCubaVC's (premature ventricular contractions). Also  has a past surgical history that includes Bunionectomy (Right, 07/10/2013); [place] osteotomy (Right, 07/10/2013); Proximal interphalangeal fusion (pip) (Right, 07/10/2013); Metatarsal head excision (Right, 07/10/2013); Colonoscopy (N/A, 07/07/2016); Esophagogastroduodenoscopy (N/A, 07/07/2016); Abdominal hysterectomy; IR Radiologist Eval & Mgmt (12/05/2016); IR INFUSION THROMBOL ARTERIAL INITIAL (MS) (04/24/2017); IR US GuideKoreaasc Access Right (04/24/2017); IR Angiogram Extremity Left (04/24/2017); IR FEM POP ART ATHERECT INC PTA MOD SED (04/24/2017); Amputation (Left, 05/09/2017); Amputation (Left, 06/13/2017); Esophagogastroduodenoscopy (N/A, 08/03/2017); Cholecystectomy (N/A, 08/22/2017); and Liver biopsy (N/A, 08/22/2017).    Social History   Socioeconomic History  . Marital status: Married    Spouse name: Wade  . Alveta Heimlicher of children: 1  . Years of education: 12  . Hi43est education level: Not on file  Occupational History  . Occupation: disabled    Comment: heart  Social Needs  . Financial resource strain: Not on file  . Food  insecurity:    Worry: Not on file  Inability: Not on file  . Transportation needs:    Medical: Not on file    Non-medical: Not on file  Tobacco Use  . Smoking status: Current Every Day Smoker    Packs/day: 0.50    Years: 37.00    Pack years: 18.50    Types: Cigarettes    Start date: 05/10/1975  . Smokeless tobacco: Never Used  . Tobacco comment: on nicotrol inhalers   Substance and Sexual Activity  . Alcohol use: Yes    Alcohol/week: 1.2 oz    Types: 2 Glasses of wine per week  . Drug use: No  . Sexual activity: Yes    Birth control/protection: Surgical  Lifestyle  . Physical activity:    Days per week: Not on file    Minutes per session: Not on file  . Stress: Not on file  Relationships  . Social connections:    Talks on phone: Not on file    Gets together: Not on file    Attends religious service: Not on file    Active member of club or organization: Not on file    Attends meetings of clubs or organizations: Not on file    Relationship status: Not on file  . Intimate partner violence:    Fear of current or ex partner: Not on file    Emotionally abused: Not on file    Physically abused: Not on file    Forced sexual activity: Not on file  Other Topics Concern  . Not on file  Social History Narrative   Disabled   Lives with husband [name]   Two dogs    Objective:   Vitals: BP (!) 159/85 (BP Location: Right Arm, Patient Position: Sitting, Cuff Size: Normal)   Pulse (!) 105   Temp 97.6 F (36.4 C) (Oral)   Resp 20   Ht 5' 2.21" (1.58 m)   Wt 163 lb (73.9 kg)   SpO2 95%   BMI 29.62 kg/m   Physical Exam  Constitutional: She is oriented to person, place, and time. She appears well-developed and well-nourished. No distress.  Appears older than stated age. Smells of tobacco.   HENT:  Head: Normocephalic and atraumatic.  Mouth/Throat: Uvula is midline, oropharynx is clear and moist and mucous membranes are normal.  Eyes: Pupils are equal, round, and reactive  to light. Conjunctivae and EOM are normal.  Neck: Normal range of motion.  Cardiovascular: Normal rate, regular rhythm, normal heart sounds and intact distal pulses.  Pulses:      Dorsalis pedis pulses are 1+ on the right side, and 1+ on the left side.       Posterior tibial pulses are 1+ on the right side, and 1+ on the left side.  Pulmonary/Chest: Effort normal. She has decreased breath sounds (diminished breath sounds b/l). She has no wheezes. She has no rhonchi. She has no rales.  Musculoskeletal:       Right lower leg: She exhibits no swelling.       Left lower leg: She exhibits no swelling.  Neurological: She is alert and oriented to person, place, and time.  Skin: Skin is warm and dry.  S/p amputation of 2nd and 5th digit of left foot.   Skin of BLE is dry and flaky.   Psychiatric: She has a normal mood and affect.  Vitals reviewed.   No results found for this or any previous visit (from the past 24 hour(s)).   BP Readings from Last 3 Encounters:  02/28/18 Marland Kitchen)  159/85  02/13/18 (!) 137/54  02/07/18 (!) 156/77   Past pertinent OV notes and procedure notes were personally reviewed by myself.   Assessment and Plan :  This was my first day meeting the patient. She has extensive PMH.  1. Essential hypertension Uncontrolled. Recommend adding daily HCTZ 12.51m to current regimen of losartan and bisoprolol. Not sure why she has a Rx for spironolactone prn. Per pt, cardiology typically manages her HTN. She has f/u with them in 2 weeks. Recommended discussing changes made today and if she is supposed to be using spironolactone prn. She is asymptomatic today. Instructed to check bp outside of office over the next couple of weeks. Given strict ED precautions. Plan to f/u with me after she sees cardiology, bring cardiology notes with her.  - CMP14+EGFR - hydrochlorothiazide (HYDRODIURIL) 12.5 MG tablet; Take 1 tablet (12.5 mg total) by mouth daily.  Dispense: 30 tablet; Refill: 0  2.  Prediabetes A1C pending.  - Hemoglobin A1c  3. Tobacco use disorder 4. Chronic obstructive pulmonary disease, unspecified COPD type ([place] Pt is using SABA frequently. She is unwilling to d/c smoking at this time. Encouraged her to reconsider smoking cessation. Would like to do outpt PFTs, CAT, and mMRC at at f/u visit.   5. PAD (peripheral artery disease) ([place] Followed by cardiology  6. Hyperlipidemia, unspecified hyperlipidemia type 7. Gastric AVM Followed by GI 8. Nonischemic cardiomyopathy ([place] Followed by cardiology  9. Iron deficiency anemia due to chronic blood loss Followed by oncology 10. Acute gastric ulcer with hemorrhage Followed by GI 11. Encounter to establish care We are happy to take over pt's care.    A total of 45 minutes was spent in the room with the patient, greater than 50% of which was in counseling/coordination of care regarding of chronic medical conditions.   BTenna Delaine PA-C  [facility] 02/28/2018 4:24 PM

## 2018-02-28 NOTE — Patient Instructions (Addendum)
In terms of elevated blood pressure, I would like you to continue daily losartan and bisoprolol. Start daily HCTZ 12.5 mg. Check blood pressure while outside the office. IIt is best if you check the blood pressure at different times in the day. Your goal is <140/90. Follow up with cardiology in 2 weeks as planned. Schedule appointment with me afterwards. If you start to have chest pain, blurred vision, shortness of breath, severe headache, lower leg swelling, or nausea/vomiting please seek care immediately here or at the ED.   DASH Eating Plan DASH stands for "Dietary Approaches to Stop Hypertension." The DASH eating plan is a healthy eating plan that has been shown to reduce high blood pressure (hypertension). It may also reduce your risk for type 2 diabetes, heart disease, and stroke. The DASH eating plan may also help with weight loss. What are tips for following this plan? General guidelines  Avoid eating more than 2,300 mg (milligrams) of salt (sodium) a day. If you have hypertension, you may need to reduce your sodium intake to 1,500 mg a day.  Limit alcohol intake to no more than 1 drink a day for nonpregnant women and 2 drinks a day for men. One drink equals 12 oz of beer, 5 oz of wine, or 1 oz of hard liquor.  Work with your health care provider to maintain a healthy body weight or to lose weight. Ask what an ideal weight is for you.  Get at least 30 minutes of exercise that causes your heart to beat faster (aerobic exercise) most days of the week. Activities may include walking, swimming, or biking.  Work with your health care provider or diet and nutrition specialist (dietitian) to adjust your eating plan to your individual calorie needs. Reading food labels  Check food labels for the amount of sodium per serving. Choose foods with less than 5 percent of the Daily Value of sodium. Generally, foods with less than 300 mg of sodium per serving fit into this eating plan.  To find  whole grains, look for the word "whole" as the first word in the ingredient list. Shopping  Buy products labeled as "low-sodium" or "no salt added."  Buy fresh foods. Avoid canned foods and premade or frozen meals. Cooking  Avoid adding salt when cooking. Use salt-free seasonings or herbs instead of table salt or sea salt. Check with your health care provider or pharmacist before using salt substitutes.  Do not fry foods. Cook foods using healthy methods such as baking, boiling, grilling, and broiling instead.  Cook with heart-healthy oils, such as olive, canola, soybean, or sunflower oil. Meal planning   Eat a balanced diet that includes: ? 5 or more servings of fruits and vegetables each day. At each meal, try to fill half of your plate with fruits and vegetables. ? Up to 6-8 servings of whole grains each day. ? Less than 6 oz of lean meat, poultry, or fish each day. A 3-oz serving of meat is about the same size as a deck of cards. One egg equals 1 oz. ? 2 servings of low-fat dairy each day. ? A serving of nuts, seeds, or beans 5 times each week. ? Heart-healthy fats. Healthy fats called Omega-3 fatty acids are found in foods such as flaxseeds and coldwater fish, like sardines, salmon, and mackerel.  Limit how much you eat of the following: ? Canned or prepackaged foods. ? Food that is high in trans fat, such as fried foods. ? Food that is  high in saturated fat, such as fatty meat. ? Sweets, desserts, sugary drinks, and other foods with added sugar. ? Full-fat dairy products.  Do not salt foods before eating.  Try to eat at least 2 vegetarian meals each week.  Eat more home-cooked food and less restaurant, buffet, and fast food.  When eating at a restaurant, ask that your food be prepared with less salt or no salt, if possible. What foods are recommended? The items listed may not be a complete list. Talk with your dietitian about what dietary choices are best for  you. Grains Whole-grain or whole-wheat bread. Whole-grain or whole-wheat pasta. Brown rice. [place]. Bulgur. Whole-grain and low-sodium cereals. Pita bread. Low-fat, low-sodium crackers. Whole-wheat flour tortillas. Vegetables Fresh or frozen vegetables (raw, steamed, roasted, or grilled). Low-sodium or reduced-sodium tomato and vegetable juice. Low-sodium or reduced-sodium tomato sauce and tomato paste. Low-sodium or reduced-sodium canned vegetables. Fruits All fresh, dried, or frozen fruit. Canned fruit in natural juice (without added sugar). Meat and other protein foods Skinless chicken or [place]. Ground chicken or [place]. Pork with fat trimmed off. Fish and seafood. Egg whites. Dried beans, peas, or lentils. Unsalted nuts, nut butters, and seeds. Unsalted canned beans. Lean cuts of beef with fat trimmed off. Low-sodium, lean deli meat. Dairy Low-fat (1%) or fat-free (skim) milk. Fat-free, low-fat, or reduced-fat cheeses. Nonfat, low-sodium ricotta or cottage cheese. Low-fat or nonfat yogurt. Low-fat, low-sodium cheese. Fats and oils Soft margarine without trans fats. Vegetable oil. Low-fat, reduced-fat, or light mayonnaise and salad dressings (reduced-sodium). Canola, safflower, olive, soybean, and sunflower oils. Avocado. Seasoning and other foods Herbs. Spices. Seasoning mixes without salt. Unsalted popcorn and pretzels. Fat-free sweets. What foods are not recommended? The items listed may not be a complete list. Talk with your dietitian about what dietary choices are best for you. Grains Baked goods made with fat, such as croissants, muffins, or some breads. Dry pasta or rice meal packs. Vegetables Creamed or fried vegetables. Vegetables in a cheese sauce. Regular canned vegetables (not low-sodium or reduced-sodium). Regular canned tomato sauce and paste (not low-sodium or reduced-sodium). Regular tomato and vegetable juice (not low-sodium or reduced-sodium). Angie Fava.  Olives. Fruits Canned fruit in a light or heavy syrup. Fried fruit. Fruit in cream or butter sauce. Meat and other protein foods Fatty cuts of meat. Ribs. Fried meat. Berniece Salines. Sausage. Bologna and other processed lunch meats. Salami. Fatback. Hotdogs. Bratwurst. Salted nuts and seeds. Canned beans with added salt. Canned or smoked fish. Whole eggs or egg yolks. Chicken or [place] with skin. Dairy Whole or 2% milk, cream, and half-and-half. Whole or full-fat cream cheese. Whole-fat or sweetened yogurt. Full-fat cheese. Nondairy creamers. Whipped toppings. Processed cheese and cheese spreads. Fats and oils Butter. Stick margarine. Lard. Shortening. Ghee. Bacon fat. Tropical oils, such as coconut, palm kernel, or palm oil. Seasoning and other foods Salted popcorn and pretzels. Onion salt, garlic salt, seasoned salt, table salt, and sea salt. Worcestershire sauce. Tartar sauce. Barbecue sauce. Teriyaki sauce. Soy sauce, including reduced-sodium. Steak sauce. Canned and packaged gravies. Fish sauce. Oyster sauce. Cocktail sauce. Horseradish that you find on the shelf. Ketchup. Mustard. Meat flavorings and tenderizers. Bouillon cubes. Hot sauce and Tabasco sauce. Premade or packaged marinades. Premade or packaged taco seasonings. Relishes. Regular salad dressings. Where to find more information:  National Heart, Lung, and [place]: https://wilson-eaton.com/  American Heart Association: www.heart.org Summary  The DASH eating plan is a healthy eating plan that has been shown to reduce high blood pressure (hypertension). It  may also reduce your risk for type 2 diabetes, heart disease, and stroke.  With the DASH eating plan, you should limit salt (sodium) intake to 2,300 mg a day. If you have hypertension, you may need to reduce your sodium intake to 1,500 mg a day.  When on the DASH eating plan, aim to eat more fresh fruits and vegetables, whole grains, lean proteins, low-fat dairy, and heart-healthy  fats.  Work with your health care provider or diet and nutrition specialist (dietitian) to adjust your eating plan to your individual calorie needs. This information is not intended to replace advice given to you by your health care provider. Make sure you discuss any questions you have with your health care provider. Document Released: 09/07/2011 Document Revised: 09/11/2016 Document Reviewed: 09/11/2016 Elsevier Interactive Patient Education  2018 [employer].   IF you received an x-ray today, you will receive an invoice from [facility]. Please contact [facility] at [phone] with questions or concerns regarding your invoice.   IF you received labwork today, you will receive an invoice from [facility]. Please contact [facility] at [phone] with questions or concerns regarding your invoice.   Our billing staff will not be able to assist you with questions regarding bills from these companies.  You will be contacted with the lab results as soon as they are available. The fastest way to get your results is to activate your My Chart account. Instructions are located on the last page of this paperwork. If you have not heard from [place] regarding the results in 2 weeks, please contact this office.

## 2018-03-01 ENCOUNTER — Encounter: Payer: Self-pay | Admitting: Physician Assistant

## 2018-03-01 ENCOUNTER — Encounter: Payer: Self-pay | Admitting: Radiology

## 2018-03-01 DIAGNOSIS — R7303 Prediabetes: Secondary | ICD-10-CM | POA: Insufficient documentation

## 2018-03-01 LAB — CMP14+EGFR
A/G RATIO: 1.4 (ref 1.2–2.2)
ALK PHOS: 120 IU/L — AB (ref 39–117)
ALT: 13 IU/L (ref 0–32)
AST: 12 IU/L (ref 0–40)
Albumin: 4.1 g/dL (ref 3.6–4.8)
BUN/Creatinine Ratio: 13 (ref 12–28)
BUN: 10 mg/dL (ref 8–27)
CHLORIDE: 105 mmol/L (ref 96–106)
CO2: 19 mmol/L — ABNORMAL LOW (ref 20–29)
Calcium: 9.5 mg/dL (ref 8.7–10.3)
Creatinine, Ser: 0.77 mg/dL (ref 0.57–1.00)
GFR calc Af Amer: 97 mL/min/{1.73_m2} (ref >59)
GFR calc non Af Amer: 84 mL/min/{1.73_m2} (ref >59)
Globulin, Total: 2.9 g/dL (ref 1.5–4.5)
Glucose: 98 mg/dL (ref 65–99)
POTASSIUM: 4.4 mmol/L (ref 3.5–5.2)
Sodium: 143 mmol/L (ref 134–144)
Total Protein: 7 g/dL (ref 6.0–8.5)

## 2018-03-01 LAB — HEMOGLOBIN A1C
ESTIMATED AVERAGE GLUCOSE: 117 mg/dL
HEMOGLOBIN A1C: 5.7 % — AB (ref 4.8–5.6)

## 2018-03-08 ENCOUNTER — Other Ambulatory Visit: Payer: Self-pay

## 2018-03-08 ENCOUNTER — Inpatient Hospital Stay (HOSPITAL_COMMUNITY): Payer: BLUE CROSS/BLUE SHIELD | Attending: Oncology

## 2018-03-08 ENCOUNTER — Encounter (HOSPITAL_COMMUNITY): Payer: Self-pay

## 2018-03-08 VITALS — BP 147/62 | HR 85 | Temp 97.7°F | Resp 20

## 2018-03-08 DIAGNOSIS — D500 Iron deficiency anemia secondary to blood loss (chronic): Secondary | ICD-10-CM | POA: Diagnosis present

## 2018-03-08 DIAGNOSIS — K31819 Angiodysplasia of stomach and duodenum without bleeding: Secondary | ICD-10-CM | POA: Insufficient documentation

## 2018-03-08 DIAGNOSIS — D508 Other iron deficiency anemias: Secondary | ICD-10-CM

## 2018-03-08 MED ORDER — CYANOCOBALAMIN 1000 MCG/ML IJ SOLN
1000.0000 ug | Freq: Once | INTRAMUSCULAR | Status: AC
  Administered 2018-03-08: 1000 ug via INTRAMUSCULAR

## 2018-03-08 MED ORDER — CYANOCOBALAMIN 1000 MCG/ML IJ SOLN
INTRAMUSCULAR | Status: AC
Start: 2018-03-08 — End: ?
  Filled 2018-03-08: qty 1

## 2018-03-08 NOTE — Progress Notes (Signed)
Tammy Mcpherson presents today for injection per MD orders. B12 1062mcg administered IM in right Upper Arm. Administration without incident. Patient tolerated well.

## 2018-03-13 ENCOUNTER — Encounter

## 2018-03-13 ENCOUNTER — Ambulatory Visit: Payer: BLUE CROSS/BLUE SHIELD | Admitting: Gastroenterology

## 2018-03-13 NOTE — Progress Notes (Signed)
Cardiology Office Note  Date: 03/14/2018   ID: MORRISSA SHEIN, DOB [birth year], MRN [id number]  PCP: [name], PA-C  Primary Cardiologist: Rozann Lesches, MD   Chief Complaint  Patient presents with  . Cardiomyopathy    History of Present Illness: Tammy Mcpherson is a 62 y.o. adult last seen in November 2018.  She is here with her husband today for a follow-up visit.  She tells me that over the last few weeks she has felt more short of breath, vague sense of fluttering in her chest.  She has also noticed weight gain, she is up almost 10 pounds compared to November of last year.  I reviewed her medications.  She has not been taking Aldactone on a regular basis, and has not used any as needed Lasix.  She was just started on HCTZ by her PCP.  Otherwise, she reports compliance with bisoprolol, Cozaar, and Lipitor.  She remains on aspirin and Plavix with history of PAD and previous angiography/intervention. She has had no regular follow-up since procedure by interventional radiology a year ago.  She reports regular claudication symptoms.  She also has a history of iron deficiency anemia with recurrent GI bleeding, follows with Dr. Oneida Alar.  She receives iron supplementation.  It has not been specifically recommended that she stop antiplatelet therapy.  Hemoglobin in May was 9.8.  Last echocardiogram was in 2017 as outlined below.  Past Medical History:  Diagnosis Date  . Anemia 04/30/2016  . Anxiety   . Blood transfusion without reported diagnosis   . Cardiomyopathy [facility])    Diagnosed July 2017  . CHF (congestive heart failure) ([place])   . COPD (chronic obstructive pulmonary disease) ([place])   . Coronary artery calcification seen on CT scan   . Essential hypertension   . GERD (gastroesophageal reflux disease)   . History of bronchitis   . History of GI bleed   . Hyperlipidemia   . Iron deficiency anemia 05/12/2016   Bleeding ulcers  . Pollen allergy   . PSVT (paroxysmal  supraventricular tachycardia) ([place])   . PVC's (premature ventricular contractions)     Past Surgical History:  Procedure Laterality Date  . ABDOMINAL HYSTERECTOMY     polyps  about age 26  . Barbie Banner OSTEOTOMY Right 07/10/2013   Procedure: Barbie Banner OSTEOTOMY RIGHT FOOT;  Surgeon: Marcheta Grammes, DPM;  Location: [facility];  Service: Orthopedics;  Laterality: Right;  . AMPUTATION Left 05/09/2017   Procedure: AMPUTATION 5TH TOE LEFT FOOT;  Surgeon: [name], DPM;  Location: [facility];  Service: Podiatry;  Laterality: Left;  . AMPUTATION Left 06/13/2017   Procedure: AMPUTATION 2ND TOE LEFT FOOT;  Surgeon: [name], DPM;  Location: [facility];  Service: Podiatry;  Laterality: Left;  . BUNIONECTOMY Right 07/10/2013   Procedure: VOGLER BUNIONECTOMY RIGHT FOOT;  Surgeon: Marcheta Grammes, DPM;  Location: [facility];  Service: Orthopedics;  Laterality: Right;  . CHOLECYSTECTOMY N/A 08/22/2017   Procedure: LAPAROSCOPIC CHOLECYSTECTOMY;  Surgeon: Virl Cagey, MD;  Location: [facility];  Service: General;  Laterality: N/A;  . COLONOSCOPY N/A 07/07/2016   Dr. Oneida Alar; redundant left colon, diverticulosis at hepatic flexure, non-bleeding internal hemorrhoids  . ESOPHAGOGASTRODUODENOSCOPY N/A 07/07/2016   Dr. Oneida Alar: many non-bleeding cratered gastric ulcers without stigmata of bleeding in gastric antrum. four 2-3 mm angioectasias without bleeding in duodenal bulb and second portion of duodenum s/p APC. Chroni gastritis on path.   . ESOPHAGOGASTRODUODENOSCOPY N/A 08/03/2017   Dr. Oneida Alar: erosive gastritis, AVMs. Found  a single non-bleeding angioectasia in stomach, s/p APC therapy. Four non-bleeding angioectasias in duodenum s/p APC. Non-bleeding erosive gastropathy  . IR ANGIOGRAM EXTREMITY LEFT  04/24/2017  . IR FEM POP ART ATHERECT INC PTA MOD SED  04/24/2017  . IR INFUSION THROMBOL ARTERIAL INITIAL (MS)  04/24/2017  . IR RADIOLOGIST EVAL & MGMT  12/05/2016  . IR US GUIDE VASC ACCESS RIGHT   04/24/2017  . LIVER BIOPSY N/A 08/22/2017   Procedure: LIVER BIOPSY;  Surgeon: Virl Cagey, MD;  Location: [facility];  Service: General;  Laterality: N/A;  . METATARSAL HEAD EXCISION Right 07/10/2013   Procedure: METATARSAL HEAD RESECTION OF DIGITS 2 AND 3 RIGHT FOOT;  Surgeon: Marcheta Grammes, DPM;  Location: [facility];  Service: Orthopedics;  Laterality: Right;  . PROXIMAL INTERPHALANGEAL FUSION (PIP) Right 07/10/2013   Procedure: ARTHRODESIS PIPJ  2ND DIGIT RIGHT FOOT;  Surgeon: Marcheta Grammes, DPM;  Location: [facility];  Service: Orthopedics;  Laterality: Right;    Current Outpatient Medications  Medication Sig Dispense Refill  . acetaminophen (TYLENOL) 500 MG tablet Take 500 mg by mouth at bedtime as needed for moderate pain.    Marland Kitchen albuterol (PROAIR HFA) 108 (90 Base) MCG/ACT inhaler INHALE 2 PUFFS INTO THE LUNGS EVERY 6 HOURS AS NEEDED FOR WHEEZING OR SHORTNESS OF BREATH. 18 g 3  . aspirin 81 MG chewable tablet Chew 81 mg by mouth daily.    Marland Kitchen atorvastatin (LIPITOR) 20 MG tablet Take 1 tablet (20 mg total) by mouth daily. 90 tablet 3  . bisoprolol (ZEBETA) 10 MG tablet Take 1 tablet (10 mg total) by mouth daily. 90 tablet 3  . cholecalciferol (VITAMIN D) 1000 units tablet Take 1,000 Units by mouth daily.     . clopidogrel (PLAVIX) 75 MG tablet Take 1 tablet (75 mg total) by mouth daily. 90 tablet 0  . docusate sodium (COLACE) 100 MG capsule Take 1 capsule (100 mg total) by mouth 2 (two) times daily. (Patient taking differently: Take 100 mg by mouth 2 (two) times daily as needed. ) 60 capsule 2  . furosemide (LASIX) 20 MG tablet Take 1 tablet (20 mg total) by mouth daily as needed for fluid. 30 tablet 1  . Ipratropium-Albuterol (COMBIVENT) 20-100 MCG/ACT AERS respimat Inhale 1 puff into the lungs every 6 (six) hours. 1 Inhaler 11  . losartan (COZAAR) 100 MG tablet TAKE ONE (1) TABLET BY MOUTH EVERY DAY 90 tablet 0  . Multiple Vitamin (MULTIVITAMIN WITH MINERALS) TABS tablet Take 1  tablet by mouth daily.    . pantoprazole (PROTONIX) 40 MG tablet Take 1 tablet (40 mg total) by mouth daily. 90 tablet 3  . spironolactone (ALDACTONE) 25 MG tablet Take 0.5 tablets (12.5 mg total) by mouth daily. 45 tablet 3   No current facility-administered medications for this visit.    Allergies:  Bee venom; Codeine; Penicillins; Bupropion; and Sudafed [pseudoephedrine hcl]   Social History: The patient  reports that she has been smoking cigarettes.  She started smoking about 42 years ago. She has a 18.50 pack-year smoking history. She has never used smokeless tobacco. She reports that she drinks about 1.2 oz of alcohol per week. She reports that she does not use drugs.   ROS:  Please see the history of present illness. Otherwise, complete review of systems is positive for none.  All other systems are reviewed and negative.   Physical Exam: VS:  BP (!) 145/73   Pulse 79   Ht 5\' 4"  (1.626 m)  Wt 165 lb (74.8 kg)   SpO2 97%   BMI 28.32 kg/m , BMI Body mass index is 28.32 kg/m.  Wt Readings from Last 3 Encounters:  03/14/18 165 lb (74.8 kg)  02/28/18 163 lb (73.9 kg)  02/13/18 164 lb (74.4 kg)    General: Chronically ill-appearing woman, no distress. HEENT: Conjunctiva and lids normal, oropharynx clear. Neck: Supple, no elevated JVP or carotid bruits, no thyromegaly. Lungs: Decreased breath sounds, nonlabored breathing at rest. Cardiac: Regular rate and rhythm, no S3, soft systolic murmur. Abdomen: Soft, nontender, bowel sounds present. Extremities: No pitting edema, distal pulses 2+. Skin: Warm and dry. Musculoskeletal: No kyphosis. Neuropsychiatric: Alert and oriented x3, affect grossly appropriate.  ECG: I personally reviewed the tracing from 05/08/2017 which showed sinus rhythm with borderline low voltage.  Recent Labwork: 03/30/2017: Magnesium 2.3 02/04/2018: Hemoglobin 9.8; Platelets 509 02/28/2018: ALT 13; AST 12; BUN 10; Creatinine, Ser 0.77; Potassium 4.4; Sodium 143       Component Value Date/Time   CHOL 148 09/28/2017 1201   TRIG 164 (H) 09/28/2017 1201   HDL 34 (L) 09/28/2017 1201   CHOLHDL 4.4 09/28/2017 1201   VLDL NOT CALC 03/30/2017 1520   LDLCALC 88 09/28/2017 1201    Other Studies Reviewed Today:  Echocardiogram 05/01/2016: Study Conclusions  - Left ventricle: The cavity size was moderately dilated. Wall thickness was increased in a pattern of mild LVH. Systolic function was mildly to moderately reduced. The estimated ejection fraction was in the range of 40% to 45%. Diffuse hypokinesis. The study is not technically sufficient to allow evaluation of LV diastolic function. Doppler parameters are consistent with both elevated ventricular end-diastolic filling pressure and elevated left atrial filling pressure. - Mitral valve: Mildly thickened leaflets . There was trivial regurgitation. - Left atrium: The atrium was moderately dilated. - Right atrium: Central venous pressure (est): 3 mm Hg. - Tricuspid valve: There was trivial regurgitation. - Pulmonary arteries: PA peak pressure: 26 mm Hg (S). - Pericardium, extracardiac: There was no pericardial effusion.  Impressions:  - Mild LVH with moderate LV chamber dilatation and LVEF approximately 40-45%. There is diffuse hypokinesis overall with indeterminate diastolic function but evidence of increased LV filling pressure. Moderate left atrial enlargement. Mildly thickened mitral leaflets with trivial mitral regurgitation. Trivial tricuspid regurgitation with PASP 26 mmHg.  Lexiscan Myoview 05/19/2016:  Upsloping ST segment depression ST segment depression was noted during stress in the II, III, aVF, V4, V5 and V6 leads. Overall nonspecific EKG changes.  The left ventricular ejection fraction is severely decreased (<30%).  The study is normal. There are no perfusion defects consistent with prior infarct or current ischemia  This is a high risk study.  High risk based on decreased LVEF. There is no myocardium currently at jeopardy.  Assessment and Plan:  1.  History of nonischemic cardiomyopathy with chronic systolic heart failure.  She has had weight gain of about 10 pounds over the last year and recent symptoms suggesting component of fluid overload.  I reviewed her medications and we will start her back on Aldactone 12.5 mg daily.  She will also use Lasix 20 mg daily for the next 2 to 3 days checking weights to get down at least about 5 pounds if possible.  She could then go to an as needed dose.  We will obtain a follow-up echocardiogram to reassess LVEF and help guide further medication adjustments.  Follow-up visit made within the next few weeks in [place] with BMET.  2.  PAD with  previous angiography/intervention.  She has not had regular follow-up and is reporting regular claudication symptoms.  Continue aspirin and Plavix.  Referral made to [facility] for follow-up and management.  3.  Essential hypertension.  Medications being adjusted as noted above.  4.  Tobacco abuse.  Smoking cessation discussed.  5.  Mixed hyperlipidemia, continues on statin therapy.  Current medicines were reviewed with the patient today.   Orders Placed This Encounter  Procedures  . Basic metabolic panel  . Ambulatory referral to Vascular Surgery  . EKG 12-Lead  . ECHOCARDIOGRAM COMPLETE    Disposition: Follow-up in the next few weeks.  Signed, Satira Sark, MD, [facility] 03/14/2018 2:38 PM    [facility] [place], [place], [place] [address] Phone: [phone]; Fax: [phone]

## 2018-03-14 ENCOUNTER — Telehealth: Payer: Self-pay | Admitting: Cardiology

## 2018-03-14 ENCOUNTER — Encounter: Payer: Self-pay | Admitting: Cardiology

## 2018-03-14 ENCOUNTER — Ambulatory Visit (INDEPENDENT_AMBULATORY_CARE_PROVIDER_SITE_OTHER): Payer: BLUE CROSS/BLUE SHIELD | Admitting: Cardiology

## 2018-03-14 VITALS — BP 145/73 | HR 79 | Ht 64.0 in | Wt 165.0 lb

## 2018-03-14 DIAGNOSIS — I10 Essential (primary) hypertension: Secondary | ICD-10-CM | POA: Diagnosis not present

## 2018-03-14 DIAGNOSIS — I5022 Chronic systolic (congestive) heart failure: Secondary | ICD-10-CM | POA: Diagnosis not present

## 2018-03-14 DIAGNOSIS — Z720 Tobacco use: Secondary | ICD-10-CM

## 2018-03-14 DIAGNOSIS — R0789 Other chest pain: Secondary | ICD-10-CM

## 2018-03-14 DIAGNOSIS — I428 Other cardiomyopathies: Secondary | ICD-10-CM | POA: Diagnosis not present

## 2018-03-14 DIAGNOSIS — E782 Mixed hyperlipidemia: Secondary | ICD-10-CM

## 2018-03-14 DIAGNOSIS — I739 Peripheral vascular disease, unspecified: Secondary | ICD-10-CM

## 2018-03-14 MED ORDER — FUROSEMIDE 20 MG PO TABS: 20.0000 mg | ORAL_TABLET | Freq: Every day | ORAL | 1 refills | Status: DC | PRN

## 2018-03-14 MED ORDER — SPIRONOLACTONE 25 MG PO TABS: 12.5000 mg | ORAL_TABLET | Freq: Every day | ORAL | 3 refills | Status: DC

## 2018-03-14 NOTE — Patient Instructions (Signed)
Medication Instructions:  Your physician has recommended you make the following change in your medication:   STOP Hydrochlorothiazide    START Spironolactone 12.5 mg daily   INCREASE Lasix to daily until you are down about 5 pounds   Please continue all other medications as prescribed  Labwork: BMET ORDERS GIVEN TODAY  Testing/Procedures: Your physician has requested that you have an echocardiogram. Echocardiography is a painless test that uses sound waves to create images of your heart. It provides your doctor with information about the size and shape of your heart and how well your heart's chambers and valves are working. This procedure takes approximately one hour. There are no restrictions for this procedure.  Follow-Up: Your physician recommends that you schedule a follow-up appointment in: 2-3 WEEKS WITH B.STRADER  Any Other Special Instructions Will Be Listed Below (If Applicable).  If you need a refill on your cardiac medications before your next appointment, please call your pharmacy.

## 2018-03-14 NOTE — Telephone Encounter (Signed)
Echo Scheduled at [facility] March 20, 2018

## 2018-03-20 ENCOUNTER — Other Ambulatory Visit (HOSPITAL_COMMUNITY)
Admission: RE | Admit: 2018-03-20 | Discharge: 2018-03-20 | Disposition: A | Discharge status: Home / Self Care | Payer: BLUE CROSS/BLUE SHIELD | Source: Ambulatory Visit | Attending: Cardiology | Admitting: Cardiology

## 2018-03-20 ENCOUNTER — Other Ambulatory Visit: Payer: Self-pay

## 2018-03-20 ENCOUNTER — Ambulatory Visit (HOSPITAL_COMMUNITY)
Admission: RE | Admit: 2018-03-20 | Discharge: 2018-03-20 | Disposition: A | Discharge status: Home / Self Care | Payer: BLUE CROSS/BLUE SHIELD | Source: Ambulatory Visit | Attending: Cardiology | Admitting: Cardiology

## 2018-03-20 ENCOUNTER — Telehealth: Payer: Self-pay

## 2018-03-20 DIAGNOSIS — Z720 Tobacco use: Secondary | ICD-10-CM | POA: Insufficient documentation

## 2018-03-20 DIAGNOSIS — I70249 Atherosclerosis of native arteries of left leg with ulceration of unspecified site: Secondary | ICD-10-CM

## 2018-03-20 DIAGNOSIS — I428 Other cardiomyopathies: Secondary | ICD-10-CM | POA: Insufficient documentation

## 2018-03-20 DIAGNOSIS — R7303 Prediabetes: Secondary | ICD-10-CM | POA: Insufficient documentation

## 2018-03-20 DIAGNOSIS — E785 Hyperlipidemia, unspecified: Secondary | ICD-10-CM | POA: Diagnosis not present

## 2018-03-20 DIAGNOSIS — I2510 Atherosclerotic heart disease of native coronary artery without angina pectoris: Secondary | ICD-10-CM | POA: Insufficient documentation

## 2018-03-20 DIAGNOSIS — I5041 Acute combined systolic (congestive) and diastolic (congestive) heart failure: Secondary | ICD-10-CM | POA: Diagnosis not present

## 2018-03-20 DIAGNOSIS — J449 Chronic obstructive pulmonary disease, unspecified: Secondary | ICD-10-CM | POA: Insufficient documentation

## 2018-03-20 LAB — BASIC METABOLIC PANEL
ANION GAP: 11 (ref 5–15)
BUN: 11 mg/dL (ref 6–20)
CO2: 20 mmol/L — ABNORMAL LOW (ref 22–32)
Calcium: 8.9 mg/dL (ref 8.9–10.3)
Chloride: 104 mmol/L (ref 101–111)
Creatinine, Ser: 0.83 mg/dL (ref 0.44–1.00)
Glucose, Bld: 214 mg/dL — ABNORMAL HIGH (ref 65–99)
Potassium: 4.2 mmol/L (ref 3.5–5.1)
Sodium: 135 mmol/L (ref 135–145)

## 2018-03-20 NOTE — Telephone Encounter (Signed)
-----   Message from Erma Heritage, [place] sent at 03/20/2018 12:47 PM EDT ----- Covering for Dr. Domenic Polite - Please let the patient know that her echocardiogram shows normal pumping function of the heart. EF remains within a normal range at 65-70%. No regional wall motion abnormalities. No significant changes when compared to prior imaging in 08/2017. BMET also shows kidney function and electrolytes remain stable. Please forward a copy to [place], [place] D, PA-C.

## 2018-03-20 NOTE — Progress Notes (Signed)
*  PRELIMINARY RESULTS* Echocardiogram 2D Echocardiogram has been performed.  Tammy Mcpherson, Tammy Mcpherson 03/20/2018, 12:13 PM

## 2018-03-20 NOTE — Telephone Encounter (Signed)
Patient notified. Routed to PCP 

## 2018-03-26 ENCOUNTER — Encounter: Payer: Self-pay | Admitting: Physician Assistant

## 2018-03-26 ENCOUNTER — Ambulatory Visit: Payer: BLUE CROSS/BLUE SHIELD | Admitting: Physician Assistant

## 2018-03-26 ENCOUNTER — Telehealth: Payer: Self-pay | Admitting: Physician Assistant

## 2018-03-26 ENCOUNTER — Other Ambulatory Visit: Payer: Self-pay

## 2018-03-26 VITALS — BP 135/73 | HR 80 | Temp 98.1°F | Resp 16 | Ht 64.0 in | Wt 163.8 lb

## 2018-03-26 DIAGNOSIS — I10 Essential (primary) hypertension: Secondary | ICD-10-CM | POA: Diagnosis not present

## 2018-03-26 DIAGNOSIS — J449 Chronic obstructive pulmonary disease, unspecified: Secondary | ICD-10-CM | POA: Diagnosis not present

## 2018-03-26 DIAGNOSIS — F17200 Nicotine dependence, unspecified, uncomplicated: Secondary | ICD-10-CM | POA: Diagnosis not present

## 2018-03-26 NOTE — Patient Instructions (Addendum)
I am going to check with your pharmacy and find out what inhalers they cover. I will contact you with this information. Follow up in 3 months for reevaluation. Thank you for letting me participate in your health and well being.     Chronic Obstructive Pulmonary Disease Chronic obstructive pulmonary disease (COPD) is a long-term (chronic) lung problem. When you have COPD, it is hard for air to get in and out of your lungs. The way your lungs work will never return to normal. Usually the condition gets worse over time. There are things you can do to keep yourself as healthy as possible. Your doctor may treat your condition with:  Medicines.  Quitting smoking, if you smoke.  Rehabilitation. This may involve a team of specialists.  Oxygen.  Exercise and changes to your diet.  Lung surgery.  Comfort measures (palliative care).  Follow these instructions at home: Medicines  Take over-the-counter and prescription medicines only as told by your doctor.  Talk to your doctor before taking any cough or allergy medicines. You may need to avoid medicines that cause your lungs to be dry. Lifestyle  If you smoke, stop. Smoking makes the problem worse. If you need help quitting, ask your doctor.  Avoid being around things that make your breathing worse. This may include smoke, chemicals, and fumes.  Stay active, but remember to also rest.  Learn and use tips on how to relax.  Make sure you get enough sleep. Most adults need at least 7 hours a night.  Eat healthy foods. Eat smaller meals more often. Rest before meals. Controlled breathing  Learn and use tips on how to control your breathing as told by your doctor. Try: ? Breathing in (inhaling) through your nose for 1 second. Then, pucker your lips and breath out (exhale) through your lips for 2 seconds. ? Putting one hand on your belly (abdomen). Breathe in slowly through your nose for 1 second. Your hand on your belly should move out.  Pucker your lips and breathe out slowly through your lips. Your hand on your belly should move in as you breathe out. Controlled coughing  Learn and use controlled coughing to clear mucus from your lungs. The steps are: 1. Lean your head a little forward. 2. Breathe in deeply. 3. Try to hold your breath for 3 seconds. 4. Keep your mouth slightly open while coughing 2 times. 5. Spit any mucus out into a tissue. 6. Rest and do the steps again 1 or 2 times as needed. General instructions  Make sure you get all the shots (vaccines) that your doctor recommends. Ask your doctor about a flu shot and a pneumonia shot.  Use oxygen therapy and therapy to help improve your lungs (pulmonary rehabilitation) if told by your doctor. If you need home oxygen therapy, ask your doctor if you should buy a tool to measure your oxygen level (oximeter).  Make a COPD action plan with your doctor. This helps you know what to do if you feel worse than usual.  Manage any other conditions you have as told by your doctor.  Avoid going outside when it is very hot, cold, or humid.  Avoid people who have a sickness you can catch (contagious).  Keep all follow-up visits as told by your doctor. This is important. Contact a doctor if:  You cough up more mucus than usual.  There is a change in the color or thickness of the mucus.  It is harder to breathe than usual.  Your breathing is faster than usual.  You have trouble sleeping.  You need to use your medicines more often than usual.  You have trouble doing your normal activities such as getting dressed or walking around the house. Get help right away if:  You have shortness of breath while resting.  You have shortness of breath that stops you from: ? Being able to talk. ? Doing normal activities.  Your chest hurts for longer than 5 minutes.  Your skin color is more blue than usual.  Your pulse oximeter shows that you have low oxygen for longer than  5 minutes.  You have a fever.  You feel too tired to breathe normally. Summary  Chronic obstructive pulmonary disease (COPD) is a long-term lung problem.  The way your lungs work will never return to normal. Usually the condition gets worse over time. There are things you can do to keep yourself as healthy as possible.  Take over-the-counter and prescription medicines only as told by your doctor.  If you smoke, stop. Smoking makes the problem worse. This information is not intended to replace advice given to you by your health care provider. Make sure you discuss any questions you have with your health care provider. Document Released: 03/06/2008 Document Revised: 02/24/2016 Document Reviewed: 05/15/2013 Elsevier Interactive Patient Education  2017 [employer].  Steps to Quit Smoking Smoking tobacco can be bad for your health. It can also affect almost every organ in your body. Smoking puts you and people around you at risk for many serious long-lasting (chronic) diseases. Quitting smoking is hard, but it is one of the best things that you can do for your health. It is never too late to quit. What are the benefits of quitting smoking? When you quit smoking, you lower your risk for getting serious diseases and conditions. They can include:  Lung cancer or lung disease.  Heart disease.  Stroke.  Heart attack.  Not being able to have children (infertility).  Weak bones (osteoporosis) and broken bones (fractures).  If you have coughing, wheezing, and shortness of breath, those symptoms may get better when you quit. You may also get sick less often. If you are pregnant, quitting smoking can help to lower your chances of having a baby of low birth weight. What can I do to help me quit smoking? Talk with your doctor about what can help you quit smoking. Some things you can do (strategies) include:  Quitting smoking totally, instead of slowly cutting back how much you smoke over a  period of time.  Going to in-person counseling. You are more likely to quit if you go to many counseling sessions.  Using resources and support systems, such as: ? Database administrator with a Social worker. ? Phone quitlines. ? Careers information officer. ? Support groups or group counseling. ? Text messaging programs. ? Mobile phone apps or applications.  Taking medicines. Some of these medicines may have nicotine in them. If you are pregnant or breastfeeding, do not take any medicines to quit smoking unless your doctor says it is okay. Talk with your doctor about counseling or other things that can help you.  Talk with your doctor about using more than one strategy at the same time, such as taking medicines while you are also going to in-person counseling. This can help make quitting easier. What things can I do to make it easier to quit? Quitting smoking might feel very hard at first, but there is a lot that you can do  to make it easier. Take these steps:  Talk to your family and friends. Ask them to support and encourage you.  Call phone quitlines, reach out to support groups, or work with a Social worker.  Ask people who smoke to not smoke around you.  Avoid places that make you want (trigger) to smoke, such as: ? Bars. ? Parties. ? Smoke-break areas at work.  Spend time with people who do not smoke.  Lower the stress in your life. Stress can make you want to smoke. Try these things to help your stress: ? Getting regular exercise. ? Deep-breathing exercises. ? Yoga. ? Meditating. ? Doing a body scan. To do this, close your eyes, focus on one area of your body at a time from head to toe, and notice which parts of your body are tense. Try to relax the muscles in those areas.  Download or buy apps on your mobile phone or tablet that can help you stick to your quit plan. There are many free apps, such as QuitGuide from the [employer] Office manager for Disease Control and Prevention). You can find more  support from smokefree.gov and other websites.  This information is not intended to replace advice given to you by your health care provider. Make sure you discuss any questions you have with your health care provider. Document Released: 07/15/2009 Document Revised: 05/16/2016 Document Reviewed: 02/02/2015 Elsevier Interactive Patient Education  2018 [employer].    Bupropion tablets (Depression/Mood Disorders) What is this medicine? BUPROPION (byoo PROE pee on) is used to treat depression. This medicine may be used for other purposes; ask your health care provider or pharmacist if you have questions. COMMON BRAND NAME(S): Wellbutrin What should I tell my health care provider before I take this medicine? They need to know if you have any of these conditions: -an eating disorder, such as anorexia or bulimia -bipolar disorder or psychosis -diabetes or high blood sugar, treated with medication -glaucoma -heart disease, previous heart attack, or irregular heart beat -head injury or brain tumor -high blood pressure -kidney or liver disease -seizures -suicidal thoughts or a previous suicide attempt -Tourette's syndrome -weight loss -an unusual or allergic reaction to bupropion, other medicines, foods, dyes, or preservatives -breast-feeding -pregnant or trying to become pregnant How should I use this medicine? Take this medicine by mouth with a glass of water. Follow the directions on the prescription label. You can take it with or without food. If it upsets your stomach, take it with food. Take your medicine at regular intervals. Do not take your medicine more often than directed. Do not stop taking this medicine suddenly except upon the advice of your doctor. Stopping this medicine too quickly may cause serious side effects or your condition may worsen. A special MedGuide will be given to you by the pharmacist with each prescription and refill. Be sure to read this information carefully  each time. Talk to your pediatrician regarding the use of this medicine in children. Special care may be needed. Overdosage: If you think you have taken too much of this medicine contact a poison control center or emergency room at once. NOTE: This medicine is only for you. Do not share this medicine with others. What if I miss a dose? If you miss a dose, take it as soon as you can. If it is less than four hours to your next dose, take only that dose and skip the missed dose. Do not take double or extra doses. What may interact with this medicine?  Do not take this medicine with any of the following medications: -linezolid -MAOIs like Azilect, Carbex, Eldepryl, Marplan, Nardil, and Parnate -methylene blue (injected into a vein) -other medicines that contain bupropion like Zyban This medicine may also interact with the following medications: -alcohol -certain medicines for anxiety or sleep -certain medicines for blood pressure like metoprolol, propranolol -certain medicines for depression or psychotic disturbances -certain medicines for HIV or AIDS like efavirenz, lopinavir, nelfinavir, ritonavir -certain medicines for irregular heart beat like propafenone, flecainide -certain medicines for Parkinson's disease like amantadine, levodopa -certain medicines for seizures like carbamazepine, phenytoin, phenobarbital -cimetidine -clopidogrel -cyclophosphamide -digoxin -furazolidone -isoniazid -nicotine -orphenadrine -procarbazine -steroid medicines like prednisone or cortisone -stimulant medicines for attention disorders, weight loss, or to stay awake -tamoxifen -theophylline -thiotepa -ticlopidine -tramadol -warfarin This list may not describe all possible interactions. Give your health care provider a list of all the medicines, herbs, non-prescription drugs, or dietary supplements you use. Also tell them if you smoke, drink alcohol, or use illegal drugs. Some items may interact with  your medicine. What should I watch for while using this medicine? Tell your doctor if your symptoms do not get better or if they get worse. Visit your doctor or health care professional for regular checks on your progress. Because it may take several weeks to see the full effects of this medicine, it is important to continue your treatment as prescribed by your doctor. Patients and their families should watch out for new or worsening thoughts of suicide or depression. Also watch out for sudden changes in feelings such as feeling anxious, agitated, panicky, irritable, hostile, aggressive, impulsive, severely restless, overly excited and hyperactive, or not being able to sleep. If this happens, especially at the beginning of treatment or after a change in dose, call your health care professional. Avoid alcoholic drinks while taking this medicine. Drinking excessive alcoholic beverages, using sleeping or anxiety medicines, or quickly stopping the use of these agents while taking this medicine may increase your risk for a seizure. Do not drive or use heavy machinery until you know how this medicine affects you. This medicine can impair your ability to perform these tasks. Do not take this medicine close to bedtime. It may prevent you from sleeping. Your mouth may get dry. Chewing sugarless gum or sucking hard candy, and drinking plenty of water may help. Contact your doctor if the problem does not go away or is severe. What side effects may I notice from receiving this medicine? Side effects that you should report to your doctor or health care professional as soon as possible: -allergic reactions like skin rash, itching or hives, swelling of the face, lips, or tongue -breathing problems -changes in vision -confusion -elevated mood, decreased need for sleep, racing thoughts, impulsive behavior -fast or irregular heartbeat -hallucinations, loss of contact with reality -increased blood pressure -redness,  blistering, peeling or loosening of the skin, including inside the mouth -seizures -suicidal thoughts or other mood changes -unusually weak or tired -vomiting Side effects that usually do not require medical attention (report to your doctor or health care professional if they continue or are bothersome): -constipation -headache -loss of appetite -nausea -tremors -weight loss This list may not describe all possible side effects. Call your doctor for medical advice about side effects. You may report side effects to FDA at 1-800-FDA-1088. Where should I keep my medicine? Keep out of the reach of children. Store at room temperature between 20 and 25 degrees C (68 and 77 degrees F), away from  direct sunlight and moisture. Keep tightly closed. Throw away any unused medicine after the expiration date. NOTE: This sheet is a summary. It may not cover all possible information. If you have questions about this medicine, talk to your doctor, pharmacist, or health care provider.  2018 Elsevier/Gold Standard (2016-03-10 13:44:21)     IF you received an x-ray today, you will receive an invoice from [facility]. Please contact [facility] at [phone] with questions or concerns regarding your invoice.   IF you received labwork today, you will receive an invoice from [facility]. Please contact [facility] at [phone] with questions or concerns regarding your invoice.   Our billing staff will not be able to assist you with questions regarding bills from these companies.  You will be contacted with the lab results as soon as they are available. The fastest way to get your results is to activate your My Chart account. Instructions are located on the last page of this paperwork. If you have not heard from [place] regarding the results in 2 weeks, please contact this office.

## 2018-03-26 NOTE — Telephone Encounter (Signed)
Copied from [place] [phone] [phone]. Topic: General - Other >> Mar 26, 2018  2:15 PM Yvette Rack wrote: Reason for CRM: [place] with [facility] requests names of other medications for albuterol (PROAIR HFA) 108 (90 Base) MCG/ACT inhaler that may be covered by patients insurance plan. Cb# [phone] Ext [phone]

## 2018-03-26 NOTE — Progress Notes (Signed)
MRN: [id number] DOB: Jun 12, 1956  Subjective:   Tammy Mcpherson is a 62 y.o. adult presenting for one-month follow-up.  I initially met patient on 02/28/18 to establish care.  Please see that OV note for chronic medical conditions.  Last seen by cardiology on 03/14/2018.  BP mildly elevated at 145/73.  She was up some weight and reported mild shortness of breath.  Hypertension medications were altered.  Aldactone 12.5 mg was added to daily regimen.  Encouraged to take Lasix for 2 to 3 days until she is down 5 pounds and then use as needed.  Stopped HCTZ. Continue with losartan 168m daily and bisoprolol 142mdaily.  Reports her blood pressure has been controlled outside the office since last cardiology visit.  I also noted her echo which had normal EF and essentially unchanged since prior echo in 2018. Follows up with cardiology on 04/05/18.  At last visit, briefly discussed COPD. Dx ~2 years ago. She is using SABA prn.  Cannot remember if she did PFTs. Has baseline DOE and daily cough. Uses albuterol prn. Typically needs it ~1-3 x per day. Was given Rx for combivent 2 months ago but it was really expensive so she only uses that if she really needs to.  Recommended she return in 1 month and have PFTs and COPD questionnaires.  Today, patient reports that she has frequent cough with phlegm, chest tightness, shortness of breath, dyspnea on exertion, and decreased energy.  She has not had a hospitalization due to her COPD and greater than 2 years.  Recently, patient has cut down from 1 ppd to 0.5 ppd.  Smokes 0.5 ppd x 44 years.  Has tried nicotrol inhalers with no relief. Denies any other aggravating or relieving factors, no other questions or concerns.  ROS per HPI  Tammy Mcpherson a current medication list which includes the following prescription(s): acetaminophen, albuterol, aspirin, atorvastatin, bisoprolol, cholecalciferol, clopidogrel, docusate sodium, furosemide, ipratropium-albuterol, losartan,  multivitamin with minerals, pantoprazole, and spironolactone. Also is allergic to bee venom; codeine; penicillins; bupropion; and sudafed [pseudoephedrine hcl].  Tammy Mcpherson a past medical history of Anemia (04/30/2016), Anxiety, Blood transfusion without reported diagnosis, Cardiomyopathy ([facility] CHF (congestive heart failure) ([facility] COPD (chronic obstructive pulmonary disease) ([facility] Coronary artery calcification seen on CT scan, Essential hypertension, GERD (gastroesophageal reflux disease), History of bronchitis, History of GI bleed, Hyperlipidemia, Iron deficiency anemia (05/12/2016), Pollen allergy, PSVT (paroxysmal supraventricular tachycardia) ([facility] and PVC's (premature ventricular contractions). Also  has a past surgical history that includes Bunionectomy (Right, 07/10/2013); [place] osteotomy (Right, 07/10/2013); Proximal interphalangeal fusion (pip) (Right, 07/10/2013); Metatarsal head excision (Right, 07/10/2013); Colonoscopy (N/A, 07/07/2016); Esophagogastroduodenoscopy (N/A, 07/07/2016); Abdominal hysterectomy; IR Radiologist Eval & Mgmt (12/05/2016); IR INFUSION THROMBOL ARTERIAL INITIAL (MS) (04/24/2017); IR USKoreauide Vasc Access Right (04/24/2017); IR Angiogram Extremity Left (04/24/2017); IR FEM POP ART ATHERECT INC PTA MOD SED (04/24/2017); Amputation (Left, 05/09/2017); Amputation (Left, 06/13/2017); Esophagogastroduodenoscopy (N/A, 08/03/2017); Cholecystectomy (N/A, 08/22/2017); and Liver biopsy (N/A, 08/22/2017).   Objective:   Vitals: BP 135/73   Pulse 80   Temp 98.1 F (36.7 C)   Resp 16   Ht _0  (1.626 m)   Wt 163 lb 12.8 oz (74.3 kg)   SpO2 95%   BMI 28.12 kg/m   Physical Exam  Constitutional: She is oriented to person, place, and time. She appears well-developed and well-nourished.  Appears older than stated age. Smells of tobacco.    HENT:  Head: Normocephalic and atraumatic.  Eyes: Conjunctivae are normal.  Neck:  Normal range of motion.  Cardiovascular: Normal rate, regular rhythm  and normal heart sounds.  Pulmonary/Chest: Effort normal. No accessory muscle usage. No respiratory distress. She has decreased breath sounds ( Diminished breath sounds auscultated bilaterally.). She has no wheezes. She has no rhonchi. She has no rales.  Neurological: She is alert and oriented to person, place, and time.  Skin: Skin is warm and dry.  Psychiatric: She has a normal mood and affect.  Vitals reviewed.  No results found for this or any previous visit (from the past 24 hour(s)).    PFTs performed.  Pretest FVC 75%, FEV1 63% of predicted.  Albuterol breathing treatment administered.  Post test FVC 75%, FEV1 61%.  Lung age 43.  CAT questionnaire score of 20.  mMRC questionnaire score of 2.   Assessment and Plan :  1. COPD, group B, by GOLD 2017 classification ([place]) Per Gold 2019 guidelines, PFTs and COPD questionnaires consistent with GOLD grade 2, group B.  Patient would benefit from daily [place] or [place] inhaler.  Use albuterol as needed.  Discussed importance of gaining better control of COPD.  Unfortunately, patient does not know what her insurance covers in terms of inhaler use.  I have personally contacted her pharmacy and they are unaware as well.  I have sent in a prescription for tiotropium.  Educated patient to see if this is affordable.  If not, recommended patient contact her insurance company and ask which laba or lama inhalers are covered and let me know.  Plan to follow-up in 3 months for reevaluation. - Tiotropium Bromide Monohydrate 1.25 MCG/ACT AERS; Inhale 2 puffs into the lungs daily.  Dispense: 4 g; Refill: 3  2. Tobacco use disorder Discussed biochemical addiction to nicotine Also discussed that there is also a hand-to-mouth component Discussed ways to quit smoking including Chantix, Zyban, Zoloft and Nicotine replacement options Discussed smoking cessation hotlines and support groups Pt is in pre-contemplation stage of change  3. Essential  hypertension Controlled today.  Continue following with cardiology.   Tenna Delaine, PA-C  [facility]  03/26/2018 11:23 AM

## 2018-03-27 ENCOUNTER — Other Ambulatory Visit: Payer: Self-pay | Admitting: Physician Assistant

## 2018-03-27 NOTE — Telephone Encounter (Signed)
See Message below: [employer] requestw

## 2018-03-27 NOTE — Telephone Encounter (Signed)
Please call patient.  She is supposed to ask which LABA or L AMA inhalers her insurance covers.  We understand that her albuterol is covered.  We need to know which other inhalers are covered.  If she does not understand this, can we please contact her insurance and ask?

## 2018-03-27 NOTE — Telephone Encounter (Signed)
Patient spouse is calling and states that he contacted his insurance company and everything is fixed now. Patient is needing a refill on albuterol (PROAIR HFA) 108 (90 Base) MCG/ACT inhaler.  [place], [place]  [place] [address]  Phone: [phone] Fax: [phone]

## 2018-03-28 ENCOUNTER — Other Ambulatory Visit: Payer: Self-pay | Admitting: Family Medicine

## 2018-03-28 ENCOUNTER — Telehealth: Payer: Self-pay | Admitting: Physician Assistant

## 2018-03-28 MED ORDER — TIOTROPIUM BROMIDE MONOHYDRATE 1.25 MCG/ACT IN AERS: 2.0000 | INHALATION_SPRAY | Freq: Every day | RESPIRATORY_TRACT | 3 refills | Status: DC

## 2018-03-28 NOTE — Telephone Encounter (Signed)
Please see note below. 

## 2018-03-28 NOTE — Telephone Encounter (Signed)
Copied from [place] [phone]. Topic: General - Other >> Mar 28, 2018  1:48 PM Mcneil, Ja-Kwan wrote: Reason for CRM: Pt states she contacted her insurance company and they will cover the Tiotropium Bromide Monohydrate 1.25 MCG/ACT AERS. Pt states she will pick it up from the pharmacy and begin taking it. Cb# [phone]

## 2018-03-28 NOTE — Telephone Encounter (Signed)
Spoke with pts husband states he was called by pharmacist and med will be ordered  And he will pick up tomorrow.

## 2018-03-29 NOTE — Telephone Encounter (Signed)
Patient was advised. Voiced understanding 

## 2018-03-29 NOTE — Telephone Encounter (Signed)
Perfect, can you please call her and tell her to use the Tiotropium inhaler daily.  Use albuterol only as needed.  Do not use Combivent with these inhalers.  Thank you.

## 2018-04-02 ENCOUNTER — Inpatient Hospital Stay (HOSPITAL_COMMUNITY): Payer: BLUE CROSS/BLUE SHIELD | Attending: Oncology

## 2018-04-02 DIAGNOSIS — R5383 Other fatigue: Secondary | ICD-10-CM | POA: Diagnosis not present

## 2018-04-02 DIAGNOSIS — R0602 Shortness of breath: Secondary | ICD-10-CM | POA: Insufficient documentation

## 2018-04-02 DIAGNOSIS — G479 Sleep disorder, unspecified: Secondary | ICD-10-CM | POA: Diagnosis not present

## 2018-04-02 DIAGNOSIS — Z7982 Long term (current) use of aspirin: Secondary | ICD-10-CM | POA: Diagnosis not present

## 2018-04-02 DIAGNOSIS — R200 Anesthesia of skin: Secondary | ICD-10-CM | POA: Diagnosis not present

## 2018-04-02 DIAGNOSIS — D500 Iron deficiency anemia secondary to blood loss (chronic): Secondary | ICD-10-CM | POA: Diagnosis present

## 2018-04-02 DIAGNOSIS — F17210 Nicotine dependence, cigarettes, uncomplicated: Secondary | ICD-10-CM | POA: Diagnosis not present

## 2018-04-02 DIAGNOSIS — D72829 Elevated white blood cell count, unspecified: Secondary | ICD-10-CM | POA: Diagnosis not present

## 2018-04-02 LAB — COMPREHENSIVE METABOLIC PANEL
ALT: 14 U/L (ref 0–44)
ANION GAP: 7 (ref 5–15)
AST: 14 U/L — ABNORMAL LOW (ref 15–41)
Albumin: 3.5 g/dL (ref 3.5–5.0)
Alkaline Phosphatase: 118 U/L (ref 38–126)
BUN: 12 mg/dL (ref 6–20)
CO2: 23 mmol/L (ref 22–32)
CREATININE: 0.86 mg/dL (ref 0.44–1.00)
Calcium: 9 mg/dL (ref 8.9–10.3)
Chloride: 107 mmol/L (ref 98–111)
Glucose, Bld: 176 mg/dL — ABNORMAL HIGH (ref 70–99)
Potassium: 4 mmol/L (ref 3.5–5.1)
Sodium: 137 mmol/L (ref 135–145)
Total Bilirubin: 0.2 mg/dL — ABNORMAL LOW (ref 0.3–1.2)
Total Protein: 7.4 g/dL (ref 6.5–8.1)

## 2018-04-02 LAB — CBC
HCT: 33.1 % — ABNORMAL LOW (ref 36.0–46.0)
Hemoglobin: 10.2 g/dL — ABNORMAL LOW (ref 12.0–15.0)
MCH: 28.7 pg (ref 26.0–34.0)
MCHC: 30.8 g/dL (ref 30.0–36.0)
MCV: 93.2 fL (ref 78.0–100.0)
PLATELETS: 613 10*3/uL — AB (ref 150–400)
RBC: 3.55 MIL/uL — AB (ref 3.87–5.11)
RDW: 17.5 % — AB (ref 11.5–15.5)
WBC: 17.6 10*3/uL — ABNORMAL HIGH (ref 4.0–10.5)

## 2018-04-02 LAB — VITAMIN B12: VITAMIN B 12: 450 pg/mL (ref 180–914)

## 2018-04-02 LAB — FERRITIN: FERRITIN: 14 ng/mL (ref 11–307)

## 2018-04-02 LAB — IRON AND TIBC
IRON: 21 ug/dL — AB (ref 28–170)
SATURATION RATIOS: 5 % — AB (ref 10.4–31.8)
TIBC: 444 ug/dL (ref 250–450)
UIBC: 423 ug/dL

## 2018-04-02 LAB — FOLATE: Folate: 9.1 ng/mL (ref >5.9)

## 2018-04-05 ENCOUNTER — Encounter: Payer: Self-pay | Admitting: Student

## 2018-04-05 ENCOUNTER — Ambulatory Visit: Payer: BLUE CROSS/BLUE SHIELD | Admitting: Student

## 2018-04-05 VITALS — BP 128/66 | HR 78 | Ht 65.0 in | Wt 162.0 lb

## 2018-04-05 DIAGNOSIS — I420 Dilated cardiomyopathy: Secondary | ICD-10-CM

## 2018-04-05 DIAGNOSIS — E782 Mixed hyperlipidemia: Secondary | ICD-10-CM | POA: Diagnosis not present

## 2018-04-05 DIAGNOSIS — I739 Peripheral vascular disease, unspecified: Secondary | ICD-10-CM | POA: Diagnosis not present

## 2018-04-05 DIAGNOSIS — I10 Essential (primary) hypertension: Secondary | ICD-10-CM

## 2018-04-05 DIAGNOSIS — Z720 Tobacco use: Secondary | ICD-10-CM | POA: Diagnosis not present

## 2018-04-05 NOTE — Progress Notes (Signed)
Cardiology Office Note    Date:  04/05/2018   ID:  Tammy Mcpherson, DOB Feb 17, 1956, MRN [id number]  PCP:  [name], PA-C  Cardiologist: Rozann Lesches, MD    Chief Complaint  Patient presents with  . Follow-up    3 week visit; s/p echocardiogram    History of Present Illness:    Tammy Mcpherson is a 61 y.o. female with past medical history of nonischemic cardiomyopathy (EF 40-45% by echo in 04/2016, improved to 60-65% by repeat imaging in 2018), PAD, HTN, HLD, and tobacco use who presents to the office today for 3-week follow-up.  She was last examined by Dr. Domenic Polite on 03/14/2018 and reported having worsening shortness of breath and a vague sensation of fluttering in her chest over the past several weeks. She was restarted on Aldactone 12.5 mg daily and instructed to use Lasix 20 mg daily for the next 2 to 3 days. Was continued on Bisoprolol and Losartan. A repeat echocardiogram was obtained and showed a preserved EF of 65 to 70% with no regional wall motion abnormalities and grade 1 diastolic dysfunction. There were no significant changes when compared to prior imaging in 08/2017.  In talking with the patient today, she reports improvement in her respiratory status since being started on Aldactone. She did take Lasix for approximately 5 days but has not utilized this within the past 2 weeks. Reports weight had overall been stable until yesterday as her weight increased by 3 pounds overnight. She did attend a cookout yesterday for [employer] and consumed a hamburger, baked beans, potato salad, and chips.   Denies any recent chest discomfort, palpitations, orthopnea, PND, or lower extremity edema. Despite her recent dietary indiscretion yesterday, she has been trying to consume a low-sodium diet.   Past Medical History:  Diagnosis Date  . Anemia 04/30/2016  . Anxiety   . Blood transfusion without reported diagnosis   . Cardiomyopathy ([place])    a. EF 40-45% by echo in  04/2016 b. Improved to 60-65% by repeat imaging in 2018  . CHF (congestive heart failure) ([place])   . COPD (chronic obstructive pulmonary disease) ([place])   . Coronary artery calcification seen on CT scan   . Essential hypertension   . GERD (gastroesophageal reflux disease)   . History of bronchitis   . History of GI bleed   . Hyperlipidemia   . Iron deficiency anemia 05/12/2016   Bleeding ulcers  . Pollen allergy   . PSVT (paroxysmal supraventricular tachycardia) ([place])   . PVC's (premature ventricular contractions)     Past Surgical History:  Procedure Laterality Date  . ABDOMINAL HYSTERECTOMY     polyps  about age 25  . Barbie Banner OSTEOTOMY Right 07/10/2013   Procedure: Barbie Banner OSTEOTOMY RIGHT FOOT;  Surgeon: Marcheta Grammes, DPM;  Location: [facility];  Service: Orthopedics;  Laterality: Right;  . AMPUTATION Left 05/09/2017   Procedure: AMPUTATION 5TH TOE LEFT FOOT;  Surgeon: [name], DPM;  Location: [facility];  Service: Podiatry;  Laterality: Left;  . AMPUTATION Left 06/13/2017   Procedure: AMPUTATION 2ND TOE LEFT FOOT;  Surgeon: [name], DPM;  Location: [facility];  Service: Podiatry;  Laterality: Left;  . BUNIONECTOMY Right 07/10/2013   Procedure: VOGLER BUNIONECTOMY RIGHT FOOT;  Surgeon: Marcheta Grammes, DPM;  Location: [facility];  Service: Orthopedics;  Laterality: Right;  . CHOLECYSTECTOMY N/A 08/22/2017   Procedure: LAPAROSCOPIC CHOLECYSTECTOMY;  Surgeon: Virl Cagey, MD;  Location: [facility];  Service: General;  Laterality:  N/A;  . COLONOSCOPY N/A 07/07/2016   Dr. Oneida Alar; redundant left colon, diverticulosis at hepatic flexure, non-bleeding internal hemorrhoids  . ESOPHAGOGASTRODUODENOSCOPY N/A 07/07/2016   Dr. Oneida Alar: many non-bleeding cratered gastric ulcers without stigmata of bleeding in gastric antrum. four 2-3 mm angioectasias without bleeding in duodenal bulb and second portion of duodenum s/p APC. Chroni gastritis on path.   . ESOPHAGOGASTRODUODENOSCOPY  N/A 08/03/2017   Dr. Oneida Alar: erosive gastritis, AVMs. Found a single non-bleeding angioectasia in stomach, s/p APC therapy. Four non-bleeding angioectasias in duodenum s/p APC. Non-bleeding erosive gastropathy  . IR ANGIOGRAM EXTREMITY LEFT  04/24/2017  . IR FEM POP ART ATHERECT INC PTA MOD SED  04/24/2017  . IR INFUSION THROMBOL ARTERIAL INITIAL (MS)  04/24/2017  . IR RADIOLOGIST EVAL & MGMT  12/05/2016  . IR US GUIDE VASC ACCESS RIGHT  04/24/2017  . LIVER BIOPSY N/A 08/22/2017   Procedure: LIVER BIOPSY;  Surgeon: Virl Cagey, MD;  Location: [facility];  Service: General;  Laterality: N/A;  . METATARSAL HEAD EXCISION Right 07/10/2013   Procedure: METATARSAL HEAD RESECTION OF DIGITS 2 AND 3 RIGHT FOOT;  Surgeon: Marcheta Grammes, DPM;  Location: [facility];  Service: Orthopedics;  Laterality: Right;  . PROXIMAL INTERPHALANGEAL FUSION (PIP) Right 07/10/2013   Procedure: ARTHRODESIS PIPJ  2ND DIGIT RIGHT FOOT;  Surgeon: Marcheta Grammes, DPM;  Location: [facility];  Service: Orthopedics;  Laterality: Right;    Current Medications: Outpatient Medications Prior to Visit  Medication Sig Dispense Refill  . acetaminophen (TYLENOL) 500 MG tablet Take 500 mg by mouth at bedtime as needed for moderate pain.    Marland Kitchen albuterol (PROAIR HFA) 108 (90 Base) MCG/ACT inhaler INHALE 2 PUFFS INTO THE LUNGS EVERY 6 HOURS AS NEEDED FOR WHEEZING OR SHORTNESS OF BREATH. 18 g 3  . aspirin 81 MG chewable tablet Chew 81 mg by mouth daily.    Marland Kitchen atorvastatin (LIPITOR) 20 MG tablet Take 1 tablet (20 mg total) by mouth daily. 90 tablet 3  . bisoprolol (ZEBETA) 10 MG tablet Take 1 tablet (10 mg total) by mouth daily. 90 tablet 3  . cholecalciferol (VITAMIN D) 1000 units tablet Take 1,000 Units by mouth daily.     . clopidogrel (PLAVIX) 75 MG tablet Take 1 tablet (75 mg total) by mouth daily. 90 tablet 0  . docusate sodium (COLACE) 100 MG capsule Take 1 capsule (100 mg total) by mouth 2 (two) times daily. (Patient taking  differently: Take 100 mg by mouth 2 (two) times daily as needed. ) 60 capsule 2  . furosemide (LASIX) 20 MG tablet Take 1 tablet (20 mg total) by mouth daily as needed for fluid. 30 tablet 1  . losartan (COZAAR) 100 MG tablet TAKE ONE (1) TABLET BY MOUTH EVERY DAY 90 tablet 0  . Multiple Vitamin (MULTIVITAMIN WITH MINERALS) TABS tablet Take 1 tablet by mouth daily.    . pantoprazole (PROTONIX) 40 MG tablet Take 1 tablet (40 mg total) by mouth daily. 90 tablet 3  . spironolactone (ALDACTONE) 25 MG tablet Take 0.5 tablets (12.5 mg total) by mouth daily. 45 tablet 3  . Ipratropium-Albuterol (COMBIVENT) 20-100 MCG/ACT AERS respimat Inhale 1 puff into the lungs every 6 (six) hours. 1 Inhaler 11  . Tiotropium Bromide Monohydrate 1.25 MCG/ACT AERS Inhale 2 puffs into the lungs daily. 4 g 3   No facility-administered medications prior to visit.      Allergies:   Bee venom; Codeine; Penicillins; Bupropion; and Sudafed [pseudoephedrine hcl]   Social History  Socioeconomic History  . Marital status: Married    Spouse name: [name]  . Number of children: 1  . Years of education: 21  . Highest education level: Not on file  Occupational History  . Occupation: disabled    Comment: heart  Social Needs  . Financial resource strain: Not on file  . Food insecurity:    Worry: Not on file    Inability: Not on file  . Transportation needs:    Medical: Not on file    Non-medical: Not on file  Tobacco Use  . Smoking status: Current Every Day Smoker    Packs/day: 0.50    Years: 44.00    Pack years: 22.00    Types: Cigarettes    Start date: 05/10/1975  . Smokeless tobacco: Never Used  . Tobacco comment: 1 pack per 2 days   Substance and Sexual Activity  . Alcohol use: Yes    Alcohol/week: 1.2 oz    Types: 2 Glasses of wine per week  . Drug use: No  . Sexual activity: Yes    Birth control/protection: Surgical  Lifestyle  . Physical activity:    Days per week: Not on file    Minutes per session:  Not on file  . Stress: Not on file  Relationships  . Social connections:    Talks on phone: Not on file    Gets together: Not on file    Attends religious service: Not on file    Active member of club or organization: Not on file    Attends meetings of clubs or organizations: Not on file    Relationship status: Not on file  Other Topics Concern  . Not on file  Social History Narrative   Disabled   Lives with husband [name]   Two dogs     Family History:  The patient's family history includes Coronary artery disease in her sister; Hypertension in her father; Ulcers in her maternal grandmother. She was adopted.   Review of Systems:   Please see the history of present illness.     General:  No chills, fever, night sweats or weight changes.  Cardiovascular:  No chest pain, edema, orthopnea, palpitations, paroxysmal nocturnal dyspnea. Positive for dyspnea on exertion.  Dermatological: No rash, lesions/masses Respiratory: No cough, dyspnea Urologic: No hematuria, dysuria Abdominal:   No nausea, vomiting, diarrhea, bright red blood per rectum, melena, or hematemesis Neurologic:  No visual changes, wkns, changes in mental status. All other systems reviewed and are otherwise negative except as noted above.   Physical Exam:    VS:  BP 128/66   Pulse 78   Ht 5\' 5"  (1.651 m)   Wt 162 lb (73.5 kg)   SpO2 95%   BMI 26.96 kg/m    General: Well developed, well nourished Caucasian female appearing in no acute distress. Head: Normocephalic, atraumatic, sclera non-icteric, no xanthomas, nares are without discharge.  Neck: No carotid bruits. JVD not elevated.  Lungs: Respirations regular and unlabored, without wheezes or rales.  Heart: Regular rate and rhythm. No S3 or S4.  No murmur, no rubs, or gallops appreciated. Abdomen: Soft, non-tender, non-distended with normoactive bowel sounds. No hepatomegaly. No rebound/guarding. No obvious abdominal masses. Msk:  Strength and tone appear normal  for age. No joint deformities or effusions. Extremities: No clubbing or cyanosis. No lower extremity edema.  Distal pedal pulses are 2+ bilaterally. Neuro: Alert and oriented X 3. Moves all extremities spontaneously. No focal deficits noted. Psych:  Responds to questions  appropriately with a normal affect. Skin: No rashes or lesions noted  Wt Readings from Last 3 Encounters:  04/05/18 162 lb (73.5 kg)  03/26/18 163 lb 12.8 oz (74.3 kg)  03/14/18 165 lb (74.8 kg)     Studies/Labs Reviewed:   EKG:  EKG is not ordered today.   Recent Labs: 04/02/2018: ALT 14; BUN 12; Creatinine, Ser 0.86; Hemoglobin 10.2; Platelets 613; Potassium 4.0; Sodium 137   Lipid Panel    Component Value Date/Time   CHOL 148 09/28/2017 1201   TRIG 164 (H) 09/28/2017 1201   HDL 34 (L) 09/28/2017 1201   CHOLHDL 4.4 09/28/2017 1201   VLDL NOT CALC 03/30/2017 1520   LDLCALC 88 09/28/2017 1201    Additional studies/ records that were reviewed today include:   Echocardiogram: 03/20/2018 Study Conclusions  - Left ventricle: The cavity size was normal. Wall thickness was   increased in a pattern of mild LVH. Systolic function was   vigorous. The estimated ejection fraction was in the range of 65%   to 70%. Wall motion was normal; there were no regional wall   motion abnormalities. Doppler parameters are consistent with   abnormal left ventricular relaxation (grade 1 diastolic   dysfunction). Doppler parameters are consistent with   indeterminate ventricular filling pressure. - Mitral valve: Mildly thickened leaflets .  Impressions:  - LV systolic function is vigorous with grade 1 diastolic   dysfuntion. No significant changes when compared to study dated   08/13/2017.  Assessment:    1. Cardiomyopathy, dilated, nonischemic ([place])   2. PAD (peripheral artery disease) ([place])   3. Essential hypertension   4. Mixed hyperlipidemia   5. Tobacco abuse      Plan:   In order of problems listed  above:  1. History of Nonischemic Cardiomyopathy - EF previously reduced to 40-45% by echo in 04/2016, improved to 60-65% by repeat imaging in 2018.  Recently evaluated for worsening dyspnea and a repeat echocardiogram was performed which showed her EF remained preserved at 65 to 70% with no regional wall motion abnormalities identified. - Reports improvement in her symptoms with the addition of Spironolactone 12.5 mg daily. Has noticed a weight gain in the past 2 days in the setting of dietary indiscretion over the recent holiday. She appears euvolemic by examination today.  - We reviewed the importance of daily weights along with limiting sodium intake. She will continue on PRN Lasix with instructions to take a tablet for weight gain > 3 lbs overnight or > 5 lbs in one week. Continue Bisoprolol, Losartan, and Spironolactone at current dosing.   2. Peripheral Arterial Disease - She reports having multiple toe amputations in the past due to PVD but is unaware of any prior stent placement. Has scheduled lower extremity dopplers and follow-up with Dr. Donnetta Hutching scheduled in 05/2018. - continue ASA, Plavix, and statin therapy.   3. HTN -BP is well controlled at 128/66 during today's visit. Continue Bisoprolol 10 mg daily, Losartan 100 mg daily, and Spironolactone 12.5 mg daily.  4. HLD - Followed by PCP. FLP in 09/2017 showed total cholesterol 148, HDL 34, triglycerides 164, and LDL 88.  Goal LDL is < 70 in the setting of known PAD. She remains on Atorvastatin 20 mg daily.  5. Tobacco Use - She continues to smoke 0.5 packs/day. Cessation was advised.   Medication Adjustments/Labs and Tests Ordered: Current medicines are reviewed at length with the patient today.  Concerns regarding medicines are outlined above.  Medication changes, Labs  and Tests ordered today are listed in the Patient Instructions below. Patient Instructions  Limit daily fluid intake to less than 2 Liters per day. Please limit  salt intake.  Please weight yourself every morning. Take a Lasix tablet if weight increases by 3 pounds overnight or 5 pounds in a single week.   Medication Instructions:  Your physician recommends that you continue on your current medications as directed. Please refer to the Current Medication list given to you today.  Labwork: NONE   Testing/Procedures: NONE   Follow-Up: Your physician wants you to follow-up in: 6 Months You will receive a reminder letter in the mail two months in advance. If you don't receive a letter, please call our office to schedule the follow-up appointment.  Any Other Special Instructions Will Be Listed Below (If Applicable).  If you need a refill on your cardiac medications before your next appointment, please call your pharmacy.  Thank you for choosing [facility]!      Signed, Erma Heritage, PA-C  04/05/2018 5:56 PM    [address] [place], [place] [address] Phone: [phone]

## 2018-04-05 NOTE — Patient Instructions (Addendum)
Limit daily fluid intake to less than 2 Liters per day. Please limit salt intake.  Please weight yourself every morning. Take a Lasix tablet if weight increases by 3 pounds overnight or 5 pounds in a single week. Medication Instructions:  Your physician recommends that you continue on your current medications as directed. Please refer to the Current Medication list given to you today.   Labwork: NONE   Testing/Procedures: NONE   Follow-Up: Your physician wants you to follow-up in: 6 Months You will receive a reminder letter in the mail two months in advance. If you don't receive a letter, please call our office to schedule the follow-up appointment.   Any Other Special Instructions Will Be Listed Below (If Applicable).     If you need a refill on your cardiac medications before your next appointment, please call your pharmacy.  Thank you for choosing [facility]!

## 2018-04-09 ENCOUNTER — Inpatient Hospital Stay (HOSPITAL_COMMUNITY): Payer: BLUE CROSS/BLUE SHIELD

## 2018-04-09 ENCOUNTER — Inpatient Hospital Stay (HOSPITAL_BASED_OUTPATIENT_CLINIC_OR_DEPARTMENT_OTHER): Payer: BLUE CROSS/BLUE SHIELD | Admitting: Hematology

## 2018-04-09 ENCOUNTER — Encounter (HOSPITAL_COMMUNITY): Payer: Self-pay | Admitting: Hematology

## 2018-04-09 VITALS — BP 149/52 | HR 101 | Temp 98.7°F | Resp 18 | Wt 162.6 lb

## 2018-04-09 DIAGNOSIS — R5383 Other fatigue: Secondary | ICD-10-CM | POA: Diagnosis not present

## 2018-04-09 DIAGNOSIS — R0602 Shortness of breath: Secondary | ICD-10-CM | POA: Diagnosis not present

## 2018-04-09 DIAGNOSIS — R200 Anesthesia of skin: Secondary | ICD-10-CM

## 2018-04-09 DIAGNOSIS — D500 Iron deficiency anemia secondary to blood loss (chronic): Secondary | ICD-10-CM

## 2018-04-09 DIAGNOSIS — G479 Sleep disorder, unspecified: Secondary | ICD-10-CM

## 2018-04-09 DIAGNOSIS — F17210 Nicotine dependence, cigarettes, uncomplicated: Secondary | ICD-10-CM

## 2018-04-09 DIAGNOSIS — D508 Other iron deficiency anemias: Secondary | ICD-10-CM

## 2018-04-09 DIAGNOSIS — D72829 Elevated white blood cell count, unspecified: Secondary | ICD-10-CM | POA: Diagnosis not present

## 2018-04-09 DIAGNOSIS — C773 Secondary and unspecified malignant neoplasm of axilla and upper limb lymph nodes: Secondary | ICD-10-CM

## 2018-04-09 DIAGNOSIS — C50912 Malignant neoplasm of unspecified site of left female breast: Secondary | ICD-10-CM | POA: Insufficient documentation

## 2018-04-09 DIAGNOSIS — Z7982 Long term (current) use of aspirin: Secondary | ICD-10-CM

## 2018-04-09 MED ORDER — CYANOCOBALAMIN 1000 MCG/ML IJ SOLN
INTRAMUSCULAR | Status: AC
  Filled 2018-04-09: qty 1

## 2018-04-09 MED ORDER — CYANOCOBALAMIN 1000 MCG/ML IJ SOLN
1000.0000 ug | Freq: Once | INTRAMUSCULAR | Status: AC
  Administered 2018-04-09: 1000 ug via INTRAMUSCULAR

## 2018-04-09 NOTE — Progress Notes (Signed)
Tammy Mcpherson tolerated Vit B12 injection well without complaints or incident. VSS Pt to see Dr. Delton Coombes today

## 2018-04-09 NOTE — Patient Instructions (Signed)
[facility] at [facility] Discharge Instructions  Received Vit B12 injection today. Follow-up as scheduled. Call clinic for any questions or concerns   Thank you for choosing [facility] at [facility] to provide your oncology and hematology care.  To afford each patient quality time with our provider, please arrive at least 15 minutes before your scheduled appointment time.   If you have a lab appointment with the Cancer Center please come in thru the  Main Entrance and check in at the main information desk  You need to re-schedule your appointment should you arrive 10 or more minutes late.  We strive to give you quality time with our providers, and arriving late affects you and other patients whose appointments are after yours.  Also, if you no show three or more times for appointments you may be dismissed from the clinic at the providers discretion.     Again, thank you for choosing [facility].  Our hope is that these requests will decrease the amount of time that you wait before being seen by our physicians.       _____________________________________________________________  Should you have questions after your visit to [facility], please contact our office at [phone] between the hours of 8:30 a.m. and 4:30 p.m.  Voicemails left after 4:30 p.m. will not be returned until the following business day.  For prescription refill requests, have your pharmacy contact our office.       Resources For Cancer Patients and their Caregivers ? American Cancer Society: Can assist with transportation, wigs, general needs, runs Look Good Feel Better.        1-888-227-6333 ? Cancer Care: Provides financial assistance, online support groups, medication/co-pay assistance.  1-800-813-HOPE (4673) ? [facility] Assists [place] cancer patients and their families through emotional , educational and  financial support.  336-427-4357 ? [place] DSS Where to apply for food stamps, Medicaid and utility assistance. 336-342-1394 ? [facility]: Transportation to medical appointments. 336-347-2287 ? Social Security Administration: May apply for disability if have a Stage IV cancer. 336-342-7796 1-800-772-1213 ? [place] Aging, Disability and Transit Services: Assists with nutrition, care and transit needs. 336-349-2343  Cancer Center Support Programs:   > Cancer Support Group  2nd Tuesday of the month 1pm-2pm, Journey Room   > Creative Journey  3rd Tuesday of the month 1130am-1pm, Journey Room    

## 2018-04-09 NOTE — Patient Instructions (Signed)
[facility] at [facility] Discharge Instructions  You saw Dr. Katragadda today.   Thank you for choosing  [facility] Center at [facility] to provide your oncology and hematology care.  To afford each patient quality time with our provider, please arrive at least 15 minutes before your scheduled appointment time.   If you have a lab appointment with the Cancer Center please come in thru the  Main Entrance and check in at the main information desk  You need to re-schedule your appointment should you arrive 10 or more minutes late.  We strive to give you quality time with our providers, and arriving late affects you and other patients whose appointments are after yours.  Also, if you no show three or more times for appointments you may be dismissed from the clinic at the providers discretion.     Again, thank you for choosing [facility].  Our hope is that these requests will decrease the amount of time that you wait before being seen by our physicians.       _____________________________________________________________  Should you have questions after your visit to  Cancer Center, please contact our office at [phone] between the hours of 8:30 a.m. and 4:30 p.m.  Voicemails left after 4:30 p.m. will not be returned until the following business day.  For prescription refill requests, have your pharmacy contact our office.       Resources For Cancer Patients and their Caregivers ? American Cancer Society: Can assist with transportation, wigs, general needs, runs Look Good Feel Better.        1-888-227-6333 ? Cancer Care: Provides financial assistance, online support groups, medication/co-pay assistance.  1-800-813-HOPE (4673) ? [facility] Assists [place] cancer patients and their families through emotional , educational and financial support.  336-427-4357 ? [place] DSS Where to apply for  food stamps, Medicaid and utility assistance. 336-342-1394 ? [facility]: Transportation to medical appointments. 336-347-2287 ? Social Security Administration: May apply for disability if have a Stage IV cancer. 336-342-7796 1-800-772-1213 ? [place] Aging, Disability and Transit Services: Assists with nutrition, care and transit needs. 336-349-2343  Cancer Center Support Programs:   > Cancer Support Group  2nd Tuesday of the month 1pm-2pm, Journey Room   > Creative Journey  3rd Tuesday of the month 1130am-1pm, Journey Room     

## 2018-04-09 NOTE — Progress Notes (Signed)
[place], [place] [address]   CLINIC:  Medical Oncology/Hematology  PCP:  [name], PA-C [place] [address] [phone]   REASON FOR VISIT:  Follow-up for iron deficiency anemia.  CURRENT THERAPY: Intermittent Feraheme infusions.   INTERVAL HISTORY:  Tammy Mcpherson 62 y.o. adult returns for follow-up of iron deficiency anemia.  She has few specks of blood for a few days, last seen 5 days ago.  Denies any dark stools.  Denies any fevers, night sweats or weight loss.  Numbness in the feet has been stable.  Complains of fatigue and shortness of breath on exertion.  She also has problems with sleep.   REVIEW OF SYSTEMS:  Review of Systems  Constitutional: Positive for fatigue.  Respiratory: Positive for shortness of breath.   Neurological: Positive for numbness.  Psychiatric/Behavioral: Positive for sleep disturbance.  All other systems reviewed and are negative.    PAST MEDICAL/SURGICAL HISTORY:  Past Medical History:  Diagnosis Date  . Anemia 04/30/2016  . Anxiety   . Blood transfusion without reported diagnosis   . Cardiomyopathy ([place])    a. EF 40-45% by echo in 04/2016 b. Improved to 60-65% by repeat imaging in 2018  . CHF (congestive heart failure) ([place])   . COPD (chronic obstructive pulmonary disease) ([place])   . Coronary artery calcification seen on CT scan   . Essential hypertension   . GERD (gastroesophageal reflux disease)   . History of bronchitis   . History of GI bleed   . Hyperlipidemia   . Iron deficiency anemia 05/12/2016   Bleeding ulcers  . Pollen allergy   . PSVT (paroxysmal supraventricular tachycardia) ([place])   . PVC's (premature ventricular contractions)    Past Surgical History:  Procedure Laterality Date  . ABDOMINAL HYSTERECTOMY     polyps  about age 89  . Barbie Banner OSTEOTOMY Right 07/10/2013   Procedure: Barbie Banner OSTEOTOMY RIGHT FOOT;  Surgeon: Marcheta Grammes, DPM;  Location: [facility];  Service: Orthopedics;  Laterality: Right;  . AMPUTATION Left 05/09/2017   Procedure: AMPUTATION 5TH TOE LEFT FOOT;  Surgeon: [name], DPM;  Location: [facility];  Service: Podiatry;  Laterality: Left;  . AMPUTATION Left 06/13/2017   Procedure: AMPUTATION 2ND TOE LEFT FOOT;  Surgeon: [name], DPM;  Location: [facility];  Service: Podiatry;  Laterality: Left;  . BUNIONECTOMY Right 07/10/2013   Procedure: VOGLER BUNIONECTOMY RIGHT FOOT;  Surgeon: Marcheta Grammes, DPM;  Location: [facility];  Service: Orthopedics;  Laterality: Right;  . CHOLECYSTECTOMY N/A 08/22/2017   Procedure: LAPAROSCOPIC CHOLECYSTECTOMY;  Surgeon: Virl Cagey, MD;  Location: [facility];  Service: General;  Laterality: N/A;  . COLONOSCOPY N/A 07/07/2016   Dr. Oneida Alar; redundant left colon, diverticulosis at hepatic flexure, non-bleeding internal hemorrhoids  . ESOPHAGOGASTRODUODENOSCOPY N/A 07/07/2016   Dr. Oneida Alar: many non-bleeding cratered gastric ulcers without stigmata of bleeding in gastric antrum. four 2-3 mm angioectasias without bleeding in duodenal bulb and second portion of duodenum s/p APC. Chroni gastritis on path.   . ESOPHAGOGASTRODUODENOSCOPY N/A 08/03/2017   Dr. Oneida Alar: erosive gastritis, AVMs. Found a single non-bleeding angioectasia in stomach, s/p APC therapy. Four non-bleeding angioectasias in duodenum s/p APC. Non-bleeding erosive gastropathy  . IR ANGIOGRAM EXTREMITY LEFT  04/24/2017  . IR FEM POP ART ATHERECT INC PTA MOD SED  04/24/2017  . IR INFUSION THROMBOL ARTERIAL INITIAL (MS)  04/24/2017  . IR RADIOLOGIST EVAL & MGMT  12/05/2016  . IR US GUIDE  VASC ACCESS RIGHT  04/24/2017  . LIVER BIOPSY N/A 08/22/2017   Procedure: LIVER BIOPSY;  Surgeon: Virl Cagey, MD;  Location: [facility];  Service: General;  Laterality: N/A;  . METATARSAL HEAD EXCISION Right 07/10/2013   Procedure: METATARSAL HEAD RESECTION OF DIGITS 2 AND 3 RIGHT FOOT;  Surgeon: Marcheta Grammes, DPM;  Location: [facility];   Service: Orthopedics;  Laterality: Right;  . PROXIMAL INTERPHALANGEAL FUSION (PIP) Right 07/10/2013   Procedure: ARTHRODESIS PIPJ  2ND DIGIT RIGHT FOOT;  Surgeon: Marcheta Grammes, DPM;  Location: [facility];  Service: Orthopedics;  Laterality: Right;     SOCIAL HISTORY:  Social History   Socioeconomic History  . Marital status: Married    Spouse name: [name]  . Number of children: 1  . Years of education: 24  . Highest education level: Not on file  Occupational History  . Occupation: disabled    Comment: heart  Social Needs  . Financial resource strain: Not on file  . Food insecurity:    Worry: Not on file    Inability: Not on file  . Transportation needs:    Medical: Not on file    Non-medical: Not on file  Tobacco Use  . Smoking status: Current Every Day Smoker    Packs/day: 0.50    Years: 44.00    Pack years: 22.00    Types: Cigarettes    Start date: 05/10/1975  . Smokeless tobacco: Never Used  . Tobacco comment: 1 pack per 2 days   Substance and Sexual Activity  . Alcohol use: Yes    Alcohol/week: 1.2 oz    Types: 2 Glasses of wine per week  . Drug use: No  . Sexual activity: Yes    Birth control/protection: Surgical  Lifestyle  . Physical activity:    Days per week: Not on file    Minutes per session: Not on file  . Stress: Not on file  Relationships  . Social connections:    Talks on phone: Not on file    Gets together: Not on file    Attends religious service: Not on file    Active member of club or organization: Not on file    Attends meetings of clubs or organizations: Not on file    Relationship status: Not on file  . Intimate partner violence:    Fear of current or ex partner: Not on file    Emotionally abused: Not on file    Physically abused: Not on file    Forced sexual activity: Not on file  Other Topics Concern  . Not on file  Social History Narrative   Disabled   Lives with husband [name]   Two dogs    FAMILY HISTORY:  Family History    Adopted: Yes  Problem Relation Age of Onset  . Hypertension Father   . Coronary artery disease Sister   . Ulcers Maternal Grandmother   . Colon cancer Neg Hx        unknown, was adopted    CURRENT MEDICATIONS:  Outpatient Encounter Medications as of 04/09/2018  Medication Sig  . acetaminophen (TYLENOL) 500 MG tablet Take 500 mg by mouth at bedtime as needed for moderate pain.  Marland Kitchen albuterol (PROAIR HFA) 108 (90 Base) MCG/ACT inhaler INHALE 2 PUFFS INTO THE LUNGS EVERY 6 HOURS AS NEEDED FOR WHEEZING OR SHORTNESS OF BREATH.  Marland Kitchen aspirin 81 MG chewable tablet Chew 81 mg by mouth daily.  Marland Kitchen atorvastatin (LIPITOR) 20 MG tablet Take 1  tablet (20 mg total) by mouth daily.  . bisoprolol (ZEBETA) 10 MG tablet Take 1 tablet (10 mg total) by mouth daily.  . cholecalciferol (VITAMIN D) 1000 units tablet Take 1,000 Units by mouth daily.   . clopidogrel (PLAVIX) 75 MG tablet Take 1 tablet (75 mg total) by mouth daily.  Marland Kitchen docusate sodium (COLACE) 100 MG capsule Take 1 capsule (100 mg total) by mouth 2 (two) times daily. (Patient taking differently: Take 100 mg by mouth 2 (two) times daily as needed. )  . furosemide (LASIX) 20 MG tablet Take 1 tablet (20 mg total) by mouth daily as needed for fluid.  Marland Kitchen losartan (COZAAR) 100 MG tablet TAKE ONE (1) TABLET BY MOUTH EVERY DAY  . Multiple Vitamin (MULTIVITAMIN WITH MINERALS) TABS tablet Take 1 tablet by mouth daily.  . pantoprazole (PROTONIX) 40 MG tablet Take 1 tablet (40 mg total) by mouth daily.  Marland Kitchen spironolactone (ALDACTONE) 25 MG tablet Take 0.5 tablets (12.5 mg total) by mouth daily.  . [EXPIRED] cyanocobalamin ((VITAMIN B-12)) injection 1,000 mcg    No facility-administered encounter medications on file as of 04/09/2018.     ALLERGIES:  Allergies  Allergen Reactions  . Bee Venom Swelling  . Codeine Anaphylaxis    Tongue swelled  . Penicillins Swelling and Rash    Has patient had a PCN reaction causing immediate rash, facial/tongue/throat swelling, SOB  or lightheadedness with hypotension: No, delayed Has patient had a PCN reaction causing severe rash involving mucus membranes or skin necrosis: No Has patient had a PCN reaction that required hospitalization No Has patient had a PCN reaction occurring within the last 10 years: No If all of the above answers are "NO", then may proceed with Cephalosporin use.   . Bupropion Other (See Comments)    "Made my skin crawl"  . Sudafed [Pseudoephedrine Hcl] Rash     PHYSICAL EXAM:  ECOG Performance status: 1  Vitals:   04/09/18 1610  BP: (!) 149/52  Pulse: (!) 101  Resp: 18  Temp: 98.7 F (37.1 C)  SpO2: 97%   Filed Weights   04/09/18 1610  Weight: 162 lb 9.6 oz (73.8 kg)    Physical Exam   LABORATORY DATA:  I have reviewed the labs as listed.  CBC    Component Value Date/Time   WBC 17.6 (H) 04/02/2018 1235   RBC 3.55 (L) 04/02/2018 1235   HGB 10.2 (L) 04/02/2018 1235   HCT 33.1 (L) 04/02/2018 1235   PLT 613 (H) 04/02/2018 1235   MCV 93.2 04/02/2018 1235   MCH 28.7 04/02/2018 1235   MCHC 30.8 04/02/2018 1235   RDW 17.5 (H) 04/02/2018 1235   LYMPHSABS 2.5 02/04/2018 1423   MONOABS 1.2 (H) 02/04/2018 1423   EOSABS 0.2 02/04/2018 1423   BASOSABS 0.0 02/04/2018 1423   CMP Latest Ref Rng & Units 04/02/2018 03/20/2018 02/28/2018  Glucose 70 - 99 mg/dL 176(H) 214(H) 98  BUN 6 - 20 mg/dL 12 11 10   Creatinine 0.44 - 1.00 mg/dL 0.86 0.83 0.77  Sodium 135 - 145 mmol/L 137 135 143  Potassium 3.5 - 5.1 mmol/L 4.0 4.2 4.4  Chloride 98 - 111 mmol/L 107 104 105  CO2 22 - 32 mmol/L 23 20(L) 19(L)  Calcium 8.9 - 10.3 mg/dL 9.0 8.9 9.5  Total Protein 6.5 - 8.1 g/dL 7.4 - 7.0  Total Bilirubin 0.3 - 1.2 mg/dL 0.2(L) - <0.2  Alkaline Phos 38 - 126 U/L 118 - 120(H)  AST 15 - 41 U/L 14(L) -  12  ALT 0 - 44 U/L 14 - 13        ASSESSMENT & PLAN:   Anemia, iron deficiency 1.  Iron deficiency anemia: - Etiology is chronic GI loss from gastric and small bowel AVMs, patient on aspirin  and Plavix.  History of having received blood transfusions. -Last Feraheme infusion was on 02/06/2018 and 02/13/2018. - Hemoglobin improved to 10.2.  Ferritin is still low at 14.  She is also feeling very tired.  Hence I have recommended 2 more infusions of Feraheme 1 week apart starting next week.  She is concerned about increasing medical bills. -She will be seen back in 3 months for follow-up with repeat blood work.  2.  Elevated Balderrama count and platelet count: -Her Basinski count has been elevated since 2014.  This is predominantly neutrophilic leukocytosis.  She also has elevated platelet count which can be high in iron deficiency state.  However as both are elevated, I will check for myeloproliferative disorders.      Orders placed this encounter:  Orders Placed This Encounter  Procedures  . CBC with Differential  . Ferritin  . Iron and TIBC  . Vitamin B12      Derek Jack, [place] [phone]

## 2018-04-09 NOTE — Assessment & Plan Note (Signed)
1.  Iron deficiency anemia: - Etiology is chronic GI loss from gastric and small bowel AVMs, patient on aspirin and Plavix.  History of having received blood transfusions. -Last Feraheme infusion was on 02/06/2018 and 02/13/2018. - Hemoglobin improved to 10.2.  Ferritin is still low at 14.  She is also feeling very tired.  Hence I have recommended 2 more infusions of Feraheme 1 week apart starting next week.  She is concerned about increasing medical bills. -She will be seen back in 3 months for follow-up with repeat blood work.  2.  Elevated [place] count and platelet count: -Her Seier count has been elevated since 2014.  This is predominantly neutrophilic leukocytosis.  She also has elevated platelet count which can be high in iron deficiency state.  However as both are elevated, I will check for myeloproliferative disorders.

## 2018-04-12 ENCOUNTER — Other Ambulatory Visit: Payer: Self-pay

## 2018-04-12 ENCOUNTER — Encounter (HOSPITAL_COMMUNITY): Payer: Self-pay

## 2018-04-12 ENCOUNTER — Inpatient Hospital Stay (HOSPITAL_COMMUNITY): Payer: BLUE CROSS/BLUE SHIELD

## 2018-04-12 DIAGNOSIS — D500 Iron deficiency anemia secondary to blood loss (chronic): Secondary | ICD-10-CM | POA: Diagnosis not present

## 2018-04-12 MED ORDER — SODIUM CHLORIDE 0.9 % IV SOLN
510.0000 mg | Freq: Once | INTRAVENOUS | Status: AC
  Administered 2018-04-12: 510 mg via INTRAVENOUS
  Filled 2018-04-12: qty 17

## 2018-04-12 MED ORDER — SODIUM CHLORIDE 0.9 % IV SOLN
INTRAVENOUS | Status: DC
  Administered 2018-04-12: 10:00:00 via INTRAVENOUS

## 2018-04-19 ENCOUNTER — Inpatient Hospital Stay (HOSPITAL_COMMUNITY): Payer: BLUE CROSS/BLUE SHIELD

## 2018-04-19 ENCOUNTER — Encounter (HOSPITAL_COMMUNITY): Payer: Self-pay

## 2018-04-19 VITALS — BP 114/54 | HR 98 | Temp 98.0°F | Resp 18

## 2018-04-19 DIAGNOSIS — D508 Other iron deficiency anemias: Secondary | ICD-10-CM

## 2018-04-19 DIAGNOSIS — C50912 Malignant neoplasm of unspecified site of left female breast: Secondary | ICD-10-CM

## 2018-04-19 DIAGNOSIS — C773 Secondary and unspecified malignant neoplasm of axilla and upper limb lymph nodes: Secondary | ICD-10-CM

## 2018-04-19 DIAGNOSIS — D500 Iron deficiency anemia secondary to blood loss (chronic): Secondary | ICD-10-CM | POA: Diagnosis not present

## 2018-04-19 DIAGNOSIS — D649 Anemia, unspecified: Secondary | ICD-10-CM

## 2018-04-19 MED ORDER — SODIUM CHLORIDE 0.9% FLUSH
10.0000 mL | Freq: Once | INTRAVENOUS | Status: AC
  Administered 2018-04-19: 10 mL via INTRAVENOUS

## 2018-04-19 MED ORDER — SODIUM CHLORIDE 0.9 % IV SOLN
510.0000 mg | Freq: Once | INTRAVENOUS | Status: AC
  Administered 2018-04-19: 510 mg via INTRAVENOUS
  Filled 2018-04-19: qty 17

## 2018-04-19 MED ORDER — SODIUM CHLORIDE 0.9 % IV SOLN
INTRAVENOUS | Status: DC
  Administered 2018-04-19: 13:00:00 via INTRAVENOUS

## 2018-04-19 NOTE — Patient Instructions (Signed)
[facility] at [facility]  Discharge Instructions:  You received an iron infusion today.  _______________________________________________________________  Thank you for choosing [facility] at [facility] to provide your oncology and hematology care.  To afford each patient quality time with our providers, please arrive at least 15 minutes before your scheduled appointment.  You need to re-schedule your appointment if you arrive 10 or more minutes late.  We strive to give you quality time with our providers, and arriving late affects you and other patients whose appointments are after yours.  Also, if you no show three or more times for appointments you may be dismissed from the clinic.  Again, thank you for choosing [facility] at [facility]. Our hope is that these requests will allow you access to exceptional care and in a timely manner. _______________________________________________________________  If you have questions after your visit, please contact our office at [phone] between the hours of 8:30 a.m. and 5:00 p.m. Voicemails left after 4:30 p.m. will not be returned until the following business day. _______________________________________________________________  For prescription refill requests, have your pharmacy contact our office. _______________________________________________________________  Recommendations made by the consultant and any test results will be sent to your referring physician. _______________________________________________________________ 

## 2018-04-19 NOTE — Progress Notes (Signed)
Patient tolerated iron infusion with no complaints voiced.  Good blood return noted before and after administration of therapy.  Peripheral IV site clean and dry with no bruising or swelling noted at site..  Band aid applied.  VSs with discharge and left ambulatory with no s/s of distress noted.

## 2018-05-10 ENCOUNTER — Encounter (HOSPITAL_COMMUNITY): Payer: Self-pay

## 2018-05-10 ENCOUNTER — Inpatient Hospital Stay (HOSPITAL_COMMUNITY): Payer: BLUE CROSS/BLUE SHIELD | Attending: Internal Medicine

## 2018-05-10 VITALS — BP 140/49 | HR 83 | Temp 97.7°F | Resp 18

## 2018-05-10 DIAGNOSIS — D508 Other iron deficiency anemias: Secondary | ICD-10-CM

## 2018-05-10 DIAGNOSIS — Z7982 Long term (current) use of aspirin: Secondary | ICD-10-CM | POA: Diagnosis not present

## 2018-05-10 DIAGNOSIS — G479 Sleep disorder, unspecified: Secondary | ICD-10-CM | POA: Diagnosis not present

## 2018-05-10 DIAGNOSIS — F17210 Nicotine dependence, cigarettes, uncomplicated: Secondary | ICD-10-CM | POA: Diagnosis not present

## 2018-05-10 DIAGNOSIS — R5383 Other fatigue: Secondary | ICD-10-CM | POA: Insufficient documentation

## 2018-05-10 DIAGNOSIS — R0602 Shortness of breath: Secondary | ICD-10-CM | POA: Diagnosis not present

## 2018-05-10 DIAGNOSIS — D72829 Elevated white blood cell count, unspecified: Secondary | ICD-10-CM | POA: Insufficient documentation

## 2018-05-10 DIAGNOSIS — R200 Anesthesia of skin: Secondary | ICD-10-CM | POA: Diagnosis not present

## 2018-05-10 DIAGNOSIS — D500 Iron deficiency anemia secondary to blood loss (chronic): Secondary | ICD-10-CM | POA: Diagnosis not present

## 2018-05-10 MED ORDER — CYANOCOBALAMIN 1000 MCG/ML IJ SOLN
1000.0000 ug | Freq: Once | INTRAMUSCULAR | Status: AC
  Administered 2018-05-10: 1000 ug via INTRAMUSCULAR

## 2018-05-10 MED ORDER — CYANOCOBALAMIN 1000 MCG/ML IJ SOLN
INTRAMUSCULAR | Status: AC
  Filled 2018-05-10: qty 1

## 2018-05-10 NOTE — Patient Instructions (Signed)
[facility] at [facility] Discharge Instructions  Received Vit B12 injection today. Follow-up as scheduled. Call clinic for any questions or concerns   Thank you for choosing [facility] at [facility] to provide your oncology and hematology care.  To afford each patient quality time with our provider, please arrive at least 15 minutes before your scheduled appointment time.   If you have a lab appointment with the Cancer Center please come in thru the  Main Entrance and check in at the main information desk  You need to re-schedule your appointment should you arrive 10 or more minutes late.  We strive to give you quality time with our providers, and arriving late affects you and other patients whose appointments are after yours.  Also, if you no show three or more times for appointments you may be dismissed from the clinic at the providers discretion.     Again, thank you for choosing [facility].  Our hope is that these requests will decrease the amount of time that you wait before being seen by our physicians.       _____________________________________________________________  Should you have questions after your visit to [facility], please contact our office at [phone] between the hours of 8:00 a.m. and 4:30 p.m.  Voicemails left after 4:00 p.m. will not be returned until the following business day.  For prescription refill requests, have your pharmacy contact our office and allow 72 hours.    Cancer Center Support Programs:   > Cancer Support Group  2nd Tuesday of the month 1pm-2pm, Journey Room   

## 2018-05-10 NOTE — Progress Notes (Signed)
Tammy Mcpherson tolerated Vit B12 injection well without complaints or incident. VSS Pt discharged self ambulatory in satisfactory condition accompanied by her husband 

## 2018-05-14 ENCOUNTER — Encounter: Payer: BLUE CROSS/BLUE SHIELD | Admitting: Vascular Surgery

## 2018-05-14 ENCOUNTER — Encounter (HOSPITAL_COMMUNITY): Payer: BLUE CROSS/BLUE SHIELD

## 2018-06-10 ENCOUNTER — Ambulatory Visit (HOSPITAL_COMMUNITY): Payer: BLUE CROSS/BLUE SHIELD

## 2018-06-11 ENCOUNTER — Inpatient Hospital Stay (HOSPITAL_COMMUNITY): Payer: BLUE CROSS/BLUE SHIELD | Attending: Oncology

## 2018-06-11 VITALS — BP 177/55 | HR 107 | Temp 97.9°F | Resp 18

## 2018-06-11 DIAGNOSIS — Z7902 Long term (current) use of antithrombotics/antiplatelets: Secondary | ICD-10-CM | POA: Diagnosis not present

## 2018-06-11 DIAGNOSIS — G479 Sleep disorder, unspecified: Secondary | ICD-10-CM | POA: Insufficient documentation

## 2018-06-11 DIAGNOSIS — R0602 Shortness of breath: Secondary | ICD-10-CM | POA: Diagnosis not present

## 2018-06-11 DIAGNOSIS — R5383 Other fatigue: Secondary | ICD-10-CM | POA: Insufficient documentation

## 2018-06-11 DIAGNOSIS — D72829 Elevated white blood cell count, unspecified: Secondary | ICD-10-CM | POA: Insufficient documentation

## 2018-06-11 DIAGNOSIS — D500 Iron deficiency anemia secondary to blood loss (chronic): Secondary | ICD-10-CM | POA: Insufficient documentation

## 2018-06-11 DIAGNOSIS — R200 Anesthesia of skin: Secondary | ICD-10-CM | POA: Diagnosis not present

## 2018-06-11 DIAGNOSIS — F17210 Nicotine dependence, cigarettes, uncomplicated: Secondary | ICD-10-CM | POA: Insufficient documentation

## 2018-06-11 DIAGNOSIS — D508 Other iron deficiency anemias: Secondary | ICD-10-CM

## 2018-06-11 MED ORDER — CYANOCOBALAMIN 1000 MCG/ML IJ SOLN
1000.0000 ug | Freq: Once | INTRAMUSCULAR | Status: AC
  Administered 2018-06-11: 1000 ug via INTRAMUSCULAR
  Filled 2018-06-11: qty 1

## 2018-06-11 NOTE — Progress Notes (Signed)
Tammy Mcpherson tolerated B12 injection without incident or complaint. VSS. BP 177/55, pt states she just took her BP medicine but feels like it is elevated because she has not eaten yet. No distress or complaints noted. Discharged self ambulatory in satisfactory condition in presence of husband.

## 2018-06-11 NOTE — Patient Instructions (Signed)
[place] at [facility]  Discharge Instructions:  You received a B12 injection today. Follow up as scheduled. Call clinic for any questions or concerns.  _______________________________________________________________  Thank you for choosing [place] at [facility] to provide your oncology and hematology care.  To afford each patient quality time with our providers, please arrive at least 15 minutes before your scheduled appointment.  You need to re-schedule your appointment if you arrive 10 or more minutes late.  We strive to give you quality time with our providers, and arriving late affects you and other patients whose appointments are after yours.  Also, if you no show three or more times for appointments you may be dismissed from the clinic.  Again, thank you for choosing [place] at [place] hope is that these requests will allow you access to exceptional care and in a timely manner. _______________________________________________________________  If you have questions after your visit, please contact our office at [phone] between the hours of 8:30 a.m. and 5:00 p.m. Voicemails left after 4:30 p.m. will not be returned until the following business day. _______________________________________________________________  For prescription refill requests, have your pharmacy contact our office. _______________________________________________________________  Recommendations made by the consultant and any test results will be sent to your referring physician. _______________________________________________________________

## 2018-06-20 ENCOUNTER — Ambulatory Visit: Payer: BLUE CROSS/BLUE SHIELD | Admitting: Physician Assistant

## 2018-07-02 ENCOUNTER — Encounter: Payer: Self-pay | Admitting: Vascular Surgery

## 2018-07-02 ENCOUNTER — Ambulatory Visit: Payer: BLUE CROSS/BLUE SHIELD | Admitting: Vascular Surgery

## 2018-07-02 ENCOUNTER — Other Ambulatory Visit: Payer: Self-pay

## 2018-07-02 ENCOUNTER — Ambulatory Visit (HOSPITAL_COMMUNITY)
Admission: RE | Admit: 2018-07-02 | Discharge: 2018-07-02 | Disposition: A | Discharge status: Home / Self Care | Payer: BLUE CROSS/BLUE SHIELD | Source: Ambulatory Visit | Attending: Vascular Surgery | Admitting: Vascular Surgery

## 2018-07-02 VITALS — BP 145/72 | HR 114 | Temp 98.0°F | Resp 14 | Ht 65.0 in | Wt 158.0 lb

## 2018-07-02 DIAGNOSIS — I739 Peripheral vascular disease, unspecified: Secondary | ICD-10-CM

## 2018-07-02 DIAGNOSIS — I70249 Atherosclerosis of native arteries of left leg with ulceration of unspecified site: Secondary | ICD-10-CM | POA: Diagnosis present

## 2018-07-02 NOTE — Progress Notes (Signed)
Vitals:   07/02/18 1501  BP: (!) 153/74  Pulse: (!) 115  Resp: 14  Temp: 98 F (36.7 C)  TempSrc: Oral  SpO2: 98%  Weight: 158 lb (71.7 kg)  Height: 5\' 5"  (1.651 m)

## 2018-07-02 NOTE — Progress Notes (Signed)
[facility] [place]  Patient name: Tammy Mcpherson MRN: [id number] DOB: 31-Dec-1955 Sex: adult  REASON FOR CONSULT: Evaluation of bilateral lower extremity discomfort  HPI: Tammy Mcpherson is a 62 y.o. adult, who is here today for evaluation of bilateral lower extremity discomfort.  She is here with her husband.  She reports that this occurs with exercise and at rest.  She reports a discomfort that begins in her buttocks bilaterally extends to the posterior thigh or calves.  She reports that she has pain at night and has difficulty with the covers of her bed lying on her legs but also has difficulty becoming comfortable even when they are uncovered.  She has no history of tissue loss.  Has had multiple prior podiatry surgeries on her feet.  She did have event of "blue toe syndrome" in July 2018.  She underwent treatment with interventional radiology.  She had a atherectomy of a proximal superficial femoral artery lesion and drug-eluting balloon angioplasty.  She had a very good result with this.  She does have a history of chronic anemia and apparently was felt to be due to what sounds like AV malformations although I do not have documentation of this.  Past Medical History:  Diagnosis Date  . Anemia 04/30/2016  . Anxiety   . Blood transfusion without reported diagnosis   . Cardiomyopathy ([place])    a. EF 40-45% by echo in 04/2016 b. Improved to 60-65% by repeat imaging in 2018  . CHF (congestive heart failure) ([place])   . COPD (chronic obstructive pulmonary disease) ([place])   . Coronary artery calcification seen on CT scan   . Essential hypertension   . GERD (gastroesophageal reflux disease)   . History of bronchitis   . History of GI bleed   . Hyperlipidemia   . Iron deficiency anemia 05/12/2016   Bleeding ulcers  . Pollen allergy   . PSVT (paroxysmal supraventricular tachycardia) ([place])   . PVC's (premature ventricular contractions)      Family History  Adopted: Yes  Problem Relation Age of Onset  . Hypertension Father   . Coronary artery disease Sister   . Ulcers Maternal Grandmother   . Colon cancer Neg Hx        unknown, was adopted    SOCIAL HISTORY: Social History   Socioeconomic History  . Marital status: Married    Spouse name: [name]  . Number of children: 1  . Years of education: 64  . Highest education level: Not on file  Occupational History  . Occupation: disabled    Comment: heart  Social Needs  . Financial resource strain: Not on file  . Food insecurity:    Worry: Not on file    Inability: Not on file  . Transportation needs:    Medical: Not on file    Non-medical: Not on file  Tobacco Use  . Smoking status: Current Every Day Smoker    Packs/day: 0.50    Years: 44.00    Pack years: 22.00    Types: Cigarettes    Start date: 05/10/1975  . Smokeless tobacco: Never Used  . Tobacco comment: 1 pack per 2 days   Substance and Sexual Activity  . Alcohol use: Yes    Alcohol/week: 2.0 standard drinks    Types: 2 Glasses of wine per week  . Drug use: No  . Sexual activity: Yes    Birth control/protection: Surgical  Lifestyle  . Physical activity:  Days per week: Not on file    Minutes per session: Not on file  . Stress: Not on file  Relationships  . Social connections:    Talks on phone: Not on file    Gets together: Not on file    Attends religious service: Not on file    Active member of club or organization: Not on file    Attends meetings of clubs or organizations: Not on file    Relationship status: Not on file  . Intimate partner violence:    Fear of current or ex partner: Not on file    Emotionally abused: Not on file    Physically abused: Not on file    Forced sexual activity: Not on file  Other Topics Concern  . Not on file  Social History Narrative   Disabled   Lives with husband [name]   Two dogs    Allergies  Allergen Reactions  . Bee Venom Swelling  .  Codeine Anaphylaxis    Tongue swelled  . Penicillins Swelling and Rash    Has patient had a PCN reaction causing immediate rash, facial/tongue/throat swelling, SOB or lightheadedness with hypotension: No, delayed Has patient had a PCN reaction causing severe rash involving mucus membranes or skin necrosis: No Has patient had a PCN reaction that required hospitalization No Has patient had a PCN reaction occurring within the last 10 years: No If all of the above answers are "NO", then may proceed with Cephalosporin use.   . Bupropion Other (See Comments)    "Made my skin crawl"  . Sudafed [Pseudoephedrine Hcl] Rash    Current Outpatient Medications  Medication Sig Dispense Refill  . acetaminophen (TYLENOL) 500 MG tablet Take 500 mg by mouth at bedtime as needed for moderate pain.    Marland Kitchen albuterol (PROAIR HFA) 108 (90 Base) MCG/ACT inhaler INHALE 2 PUFFS INTO THE LUNGS EVERY 6 HOURS AS NEEDED FOR WHEEZING OR SHORTNESS OF BREATH. 18 g 3  . aspirin 81 MG chewable tablet Chew 81 mg by mouth daily.    Marland Kitchen atorvastatin (LIPITOR) 20 MG tablet Take 1 tablet (20 mg total) by mouth daily. 90 tablet 3  . bisoprolol (ZEBETA) 10 MG tablet Take 1 tablet (10 mg total) by mouth daily. 90 tablet 3  . cholecalciferol (VITAMIN D) 1000 units tablet Take 1,000 Units by mouth daily.     . clopidogrel (PLAVIX) 75 MG tablet Take 1 tablet (75 mg total) by mouth daily. 90 tablet 0  . furosemide (LASIX) 20 MG tablet Take 1 tablet (20 mg total) by mouth daily as needed for fluid. 30 tablet 1  . losartan (COZAAR) 100 MG tablet TAKE ONE (1) TABLET BY MOUTH EVERY DAY 90 tablet 0  . Multiple Vitamin (MULTIVITAMIN WITH MINERALS) TABS tablet Take 1 tablet by mouth daily.    . pantoprazole (PROTONIX) 40 MG tablet Take 1 tablet (40 mg total) by mouth daily. 90 tablet 3  . docusate sodium (COLACE) 100 MG capsule Take 1 capsule (100 mg total) by mouth 2 (two) times daily. (Patient not taking: Reported on 07/02/2018) 60 capsule 2  .  spironolactone (ALDACTONE) 25 MG tablet Take 0.5 tablets (12.5 mg total) by mouth daily. 45 tablet 3   No current facility-administered medications for this visit.     REVIEW OF SYSTEMS:  [X]  denotes positive finding, [ ]  denotes negative finding Cardiac  Comments:  Chest pain or chest pressure:    Shortness of breath upon exertion:    Short of  breath when lying flat: x   Irregular heart rhythm:        Vascular    Pain in calf, thigh, or hip brought on by ambulation: x   Pain in feet at night that wakes you up from your sleep:  x   Blood clot in your veins:    Leg swelling:         Pulmonary    Oxygen at home:    Productive cough:     Wheezing:         Neurologic    Sudden weakness in arms or legs:     Sudden numbness in arms or legs:  x   Sudden onset of difficulty speaking or slurred speech:    Temporary loss of vision in one eye:     Problems with dizziness:  x       Gastrointestinal    Blood in stool:     Vomited blood:         Genitourinary    Burning when urinating:     Blood in urine:        Psychiatric    Major depression:         Hematologic    Bleeding problems:    Problems with blood clotting too easily:        Skin    Rashes or ulcers:        Constitutional    Fever or chills:      PHYSICAL EXAM: Vitals:   07/02/18 1501 07/02/18 1506  BP: (!) 153/74 (!) 145/72  Pulse: (!) 115 (!) 114  Resp: 14   Temp: 98 F (36.7 C)   TempSrc: Oral   SpO2: 98%   Weight: 158 lb (71.7 kg)   Height: 5\' 5"  (1.651 m)     GENERAL: The patient is a well-nourished adult, in no acute distress. The vital signs are documented above. CARDIOVASCULAR: Carotid arteries are without bruits bilaterally.  2+ radial 2+ popliteal and 2+ dorsalis pedis and posterior tibial pulses bilaterally PULMONARY: There is good air exchange  ABDOMEN: Soft and non-tender  MUSCULOSKELETAL: There are no major deformities or cyanosis. NEUROLOGIC: No focal weakness or paresthesias are  detected. SKIN: There are no ulcers or rashes noted. PSYCHIATRIC: The patient has a normal affect.  DATA:  Lower extremity duplex studies today reveal mild narrowing in the mid superficial femoral artery bilaterally.  She has triphasic and biphasic pedal waveforms.  MEDICAL ISSUES: Had a long discussion with the patient and her husband.  I do not see any evidence of significant arterial insufficiency.  I explained that it would be nearly impossible for her resting symptoms to be attributable to arterial insufficiency.  This sounds more neurogenic with pain in her buttocks posterior thigh and extending down into her calves that is equal with exercise and at rest.  I would not recommend any further arterial evaluation.  Her arteriogram from July 2018 was reviewed and showed completely normal aortoiliac segments.  She will follow-up with her primary care provider for potential neurologic referral.   Rosetta Posner, MD [facility] Tel [phone] Pager [phone]

## 2018-07-03 ENCOUNTER — Inpatient Hospital Stay (HOSPITAL_COMMUNITY): Payer: BLUE CROSS/BLUE SHIELD | Attending: Hematology

## 2018-07-03 DIAGNOSIS — R531 Weakness: Secondary | ICD-10-CM | POA: Insufficient documentation

## 2018-07-03 DIAGNOSIS — C773 Secondary and unspecified malignant neoplasm of axilla and upper limb lymph nodes: Secondary | ICD-10-CM

## 2018-07-03 DIAGNOSIS — F17210 Nicotine dependence, cigarettes, uncomplicated: Secondary | ICD-10-CM | POA: Diagnosis not present

## 2018-07-03 DIAGNOSIS — Z79899 Other long term (current) drug therapy: Secondary | ICD-10-CM | POA: Insufficient documentation

## 2018-07-03 DIAGNOSIS — D500 Iron deficiency anemia secondary to blood loss (chronic): Secondary | ICD-10-CM | POA: Diagnosis present

## 2018-07-03 DIAGNOSIS — Z23 Encounter for immunization: Secondary | ICD-10-CM | POA: Insufficient documentation

## 2018-07-03 DIAGNOSIS — C50912 Malignant neoplasm of unspecified site of left female breast: Secondary | ICD-10-CM

## 2018-07-03 DIAGNOSIS — Z7982 Long term (current) use of aspirin: Secondary | ICD-10-CM | POA: Diagnosis not present

## 2018-07-03 DIAGNOSIS — R5382 Chronic fatigue, unspecified: Secondary | ICD-10-CM | POA: Diagnosis not present

## 2018-07-03 LAB — CBC WITH DIFFERENTIAL/PLATELET
BASOS ABS: 0 10*3/uL (ref 0.0–0.1)
Basophils Relative: 0 %
EOS PCT: 1 %
Eosinophils Absolute: 0.2 10*3/uL (ref 0.0–0.7)
HCT: 22.6 % — ABNORMAL LOW (ref 36.0–46.0)
Hemoglobin: 6.4 g/dL — CL (ref 12.0–15.0)
LYMPHS ABS: 1.7 10*3/uL (ref 0.7–4.0)
LYMPHS PCT: 11 %
MCH: 22.8 pg — ABNORMAL LOW (ref 26.0–34.0)
MCHC: 28.3 g/dL — ABNORMAL LOW (ref 30.0–36.0)
MCV: 80.4 fL (ref 78.0–100.0)
MONO ABS: 1.9 10*3/uL — AB (ref 0.1–1.0)
Monocytes Relative: 12 %
Neutro Abs: 12.2 10*3/uL — ABNORMAL HIGH (ref 1.7–7.7)
Neutrophils Relative %: 76 %
Platelets: 685 10*3/uL — ABNORMAL HIGH (ref 150–400)
RBC: 2.81 MIL/uL — ABNORMAL LOW (ref 3.87–5.11)
RDW: 20 % — AB (ref 11.5–15.5)
WBC: 16.1 10*3/uL — ABNORMAL HIGH (ref 4.0–10.5)

## 2018-07-03 LAB — FERRITIN: Ferritin: 6 ng/mL — ABNORMAL LOW (ref 11–307)

## 2018-07-03 LAB — IRON AND TIBC
IRON: 12 ug/dL — AB (ref 28–170)
SATURATION RATIOS: 2 % — AB (ref 10.4–31.8)
TIBC: 491 ug/dL — ABNORMAL HIGH (ref 250–450)
UIBC: 479 ug/dL

## 2018-07-03 LAB — VITAMIN B12: Vitamin B-12: 507 pg/mL (ref 180–914)

## 2018-07-03 NOTE — Progress Notes (Unsigned)
CRITICAL VALUE ALERT Critical value received:  Hgb 6.4 Date of notification:  07/03/18 Time of notification: 9584 Critical value read back:  Yes.   Nurse who received alert:  Venita Lick LPN MD notified (1st page):  Dr. Delton Coombes

## 2018-07-04 ENCOUNTER — Inpatient Hospital Stay (HOSPITAL_COMMUNITY): Payer: BLUE CROSS/BLUE SHIELD

## 2018-07-04 ENCOUNTER — Other Ambulatory Visit (HOSPITAL_COMMUNITY): Payer: Self-pay

## 2018-07-04 DIAGNOSIS — D509 Iron deficiency anemia, unspecified: Secondary | ICD-10-CM

## 2018-07-04 DIAGNOSIS — D500 Iron deficiency anemia secondary to blood loss (chronic): Secondary | ICD-10-CM | POA: Diagnosis not present

## 2018-07-04 LAB — PREPARE RBC (CROSSMATCH)

## 2018-07-05 ENCOUNTER — Encounter (HOSPITAL_COMMUNITY): Payer: Self-pay

## 2018-07-05 ENCOUNTER — Inpatient Hospital Stay (HOSPITAL_COMMUNITY): Payer: BLUE CROSS/BLUE SHIELD

## 2018-07-05 VITALS — BP 145/60 | HR 99 | Temp 98.2°F | Resp 18

## 2018-07-05 DIAGNOSIS — R252 Cramp and spasm: Secondary | ICD-10-CM

## 2018-07-05 DIAGNOSIS — D500 Iron deficiency anemia secondary to blood loss (chronic): Secondary | ICD-10-CM | POA: Diagnosis not present

## 2018-07-05 DIAGNOSIS — D509 Iron deficiency anemia, unspecified: Secondary | ICD-10-CM

## 2018-07-05 MED ORDER — ACETAMINOPHEN 325 MG PO TABS
650.0000 mg | ORAL_TABLET | Freq: Once | ORAL | Status: AC
  Administered 2018-07-05: 650 mg via ORAL

## 2018-07-05 MED ORDER — DIPHENHYDRAMINE HCL 25 MG PO CAPS
25.0000 mg | ORAL_CAPSULE | Freq: Once | ORAL | Status: AC
  Administered 2018-07-05: 25 mg via ORAL

## 2018-07-05 MED ORDER — SODIUM CHLORIDE 0.9% IV SOLUTION
250.0000 mL | Freq: Once | INTRAVENOUS | Status: AC
  Administered 2018-07-05: 250 mL via INTRAVENOUS

## 2018-07-05 MED ORDER — DIPHENHYDRAMINE HCL 25 MG PO CAPS
ORAL_CAPSULE | ORAL | Status: AC
  Filled 2018-07-05: qty 1

## 2018-07-05 MED ORDER — SODIUM CHLORIDE 0.9% FLUSH
10.0000 mL | INTRAVENOUS | Status: AC | PRN
  Administered 2018-07-05: 10 mL

## 2018-07-05 MED ORDER — OCTREOTIDE ACETATE 30 MG IM KIT
PACK | INTRAMUSCULAR | Status: AC
  Filled 2018-07-05: qty 1

## 2018-07-05 MED ORDER — ACETAMINOPHEN 325 MG PO TABS
ORAL_TABLET | ORAL | Status: AC
  Filled 2018-07-05: qty 2

## 2018-07-05 MED ORDER — CYCLOBENZAPRINE HCL 5 MG PO TABS
5.0000 mg | ORAL_TABLET | Freq: Once | ORAL | Status: AC
  Administered 2018-07-05: 5 mg via ORAL
  Filled 2018-07-05: qty 1

## 2018-07-05 NOTE — Patient Instructions (Signed)

## 2018-07-05 NOTE — Progress Notes (Signed)
1020-Patient stated the "bottom of my feet" are hurting but denied any other pain.  No s/s of distress noted.  1035-patient stated her lower legs were hurting and reviewed the side effects of a blood transfusion with the patient and instructed the patient I would review her symptoms with the doctor.   No s/s of distress noted.   1040-VSS with no s/s of distress noted.  Reviewed symptoms with the oncologist and Nurse Practitioner with verbal order ok to give second unit of blood.    1050-After further review with the patient she stated she has been having leg cramping prn for the past three weeks and uses tylenol for relief.  Notified the nurse practitioner with verbal order Flexeril 5mg  by mouth for one time order.  Patient rated leg cramping 5 on scale.    1116-flexeril 5mg  by mouth given.  1143-Patient stated leg cramping was better and rated 3 on scale.    Patient tolerated blood transfusions with no complaints voiced.  Information given for side effects and when to call or go to the emergency room.  Verbalized understanding.  Peripheral IV clean and dry with no bruising or swelling noted at site.  Band aid applied.  VSS with discharge and left ambulatory with family with no s/s of distress noted.

## 2018-07-06 ENCOUNTER — Telehealth: Payer: Self-pay | Admitting: Physician Assistant

## 2018-07-06 LAB — TYPE AND SCREEN
ABO/RH(D): O POS
ANTIBODY SCREEN: NEGATIVE
UNIT DIVISION: 0
Unit division: 0

## 2018-07-06 LAB — BPAM RBC
Blood Product Expiration Date: 201910272359
Blood Product Expiration Date: 201910272359
ISSUE DATE / TIME: 201910040854
ISSUE DATE / TIME: 201910041125
UNIT TYPE AND RH: 5100
Unit Type and Rh: 5100

## 2018-07-08 LAB — JAK2 GENOTYPR

## 2018-07-08 NOTE — Telephone Encounter (Signed)
Pt returning call for prescription refill of Plavix. Previous prescription was refused. Prescription was last filled by a different provider. Pt has appt with Lorelei Pont on 07/23/18.

## 2018-07-08 NOTE — Telephone Encounter (Signed)
Requested medication (s) are due for refill today: Yes  Requested medication (s) are on the active medication list: Yes  Last refill:  12/31/17  Future visit scheduled: Yes  Notes to clinic:  Unable to refill per protocol due to labs out of range     Requested Prescriptions  Pending Prescriptions Disp Refills   clopidogrel (PLAVIX) 75 MG tablet [Pharmacy Med Name: CLOPIDOGREL BISULFATE 75 MG TAB] 90 tablet 0    Sig: TAKE ONE TABLET (75MG  TOTAL) BY MOUTH DAILY     Hematology: Antiplatelets - clopidogrel Failed - 07/06/2018  9:09 AM      Failed - Evaluate AST, ALT within 2 months of therapy initiation.      Failed - AST in normal range and within 360 days    AST  Date Value Ref Range Status  04/02/2018 14 (L) 15 - 41 U/L Final         Failed - HCT in normal range and within 180 days    HCT  Date Value Ref Range Status  07/03/2018 22.6 (L) 36.0 - 46.0 % Final         Failed - HGB in normal range and within 180 days    Hemoglobin  Date Value Ref Range Status  07/03/2018 6.4 (LL) 12.0 - 15.0 g/dL Final    Comment:    REPEATED TO VERIFY CRITICAL RESULT CALLED TO, READ BACK BY AND VERIFIED WITH: ASHLEY TRAVIS AT 1325 BY HFLYNT 07/03/18          Failed - PLT in normal range and within 180 days    Platelets  Date Value Ref Range Status  07/03/2018 685 (H) 150 - 400 K/uL Final         Passed - ALT in normal range and within 360 days    ALT  Date Value Ref Range Status  04/02/2018 14 0 - 44 U/L Final    Comment:    Please note change in reference range.         Passed - Valid encounter within last 6 months    Recent Outpatient Visits          3 months ago COPD, group B, by GOLD 2017 classification [facility])   [facility] at [place], [place] D, PA-C   4 months ago Essential hypertension   [facility], Reather Laurence, PA-C      Future Appointments            In 2 days Derek Jack, MD [facility]   In 2 weeks [name], PA-C [facility] at [place], [facility]

## 2018-07-08 NOTE — Telephone Encounter (Signed)
Copied from [place] [phone]. Topic: Quick Communication - Rx Refill/Question >> Jul 08, 2018  5:11 PM Tammy Mcpherson wrote: Medication: clopidogrel (PLAVIX) 75 MG tablet [[id number]]   Has the patient contacted their pharmacy? Yes.   (Agent: If no, request that the patient contact the pharmacy for the refill.) (Agent: If yes, when and what did the pharmacy advise?)  Preferred Pharmacy (with phone number or street name): [place]: Please be advised that RX refills may take up to 3 business days. We ask that you follow-up with your pharmacy.

## 2018-07-09 ENCOUNTER — Other Ambulatory Visit: Payer: Self-pay | Admitting: Cardiology

## 2018-07-09 MED ORDER — CLOPIDOGREL BISULFATE 75 MG PO TABS: 75.0000 mg | ORAL_TABLET | Freq: Every day | ORAL | 1 refills | Status: DC

## 2018-07-09 NOTE — Telephone Encounter (Signed)
Refill completed.

## 2018-07-09 NOTE — Telephone Encounter (Signed)
It appears this was filled by her cardiologist today. Please let pt know.

## 2018-07-09 NOTE — Telephone Encounter (Signed)
Patient's husband, [name], calling to check the status of the refill. Please advise. Would like a call with an update.

## 2018-07-09 NOTE — Telephone Encounter (Signed)
Refill on Plavix sent to [facility] / [facility]

## 2018-07-10 ENCOUNTER — Encounter (HOSPITAL_COMMUNITY): Payer: Self-pay | Admitting: Hematology

## 2018-07-10 ENCOUNTER — Inpatient Hospital Stay (HOSPITAL_COMMUNITY): Payer: BLUE CROSS/BLUE SHIELD

## 2018-07-10 ENCOUNTER — Ambulatory Visit (HOSPITAL_COMMUNITY): Payer: BLUE CROSS/BLUE SHIELD

## 2018-07-10 ENCOUNTER — Inpatient Hospital Stay (HOSPITAL_COMMUNITY): Payer: BLUE CROSS/BLUE SHIELD | Attending: Hematology | Admitting: Hematology

## 2018-07-10 VITALS — BP 156/48 | HR 84 | Temp 98.0°F | Resp 20 | Wt 162.0 lb

## 2018-07-10 DIAGNOSIS — F17210 Nicotine dependence, cigarettes, uncomplicated: Secondary | ICD-10-CM | POA: Insufficient documentation

## 2018-07-10 DIAGNOSIS — Z7982 Long term (current) use of aspirin: Secondary | ICD-10-CM | POA: Diagnosis not present

## 2018-07-10 DIAGNOSIS — R5382 Chronic fatigue, unspecified: Secondary | ICD-10-CM | POA: Diagnosis not present

## 2018-07-10 DIAGNOSIS — Z79899 Other long term (current) drug therapy: Secondary | ICD-10-CM | POA: Insufficient documentation

## 2018-07-10 DIAGNOSIS — R531 Weakness: Secondary | ICD-10-CM | POA: Insufficient documentation

## 2018-07-10 DIAGNOSIS — D500 Iron deficiency anemia secondary to blood loss (chronic): Secondary | ICD-10-CM | POA: Diagnosis not present

## 2018-07-10 DIAGNOSIS — D509 Iron deficiency anemia, unspecified: Secondary | ICD-10-CM | POA: Insufficient documentation

## 2018-07-10 DIAGNOSIS — D508 Other iron deficiency anemias: Secondary | ICD-10-CM

## 2018-07-10 MED ORDER — INFLUENZA VAC SPLIT QUAD 0.5 ML IM SUSY
PREFILLED_SYRINGE | INTRAMUSCULAR | Status: AC
  Filled 2018-07-10: qty 0.5

## 2018-07-10 MED ORDER — CYANOCOBALAMIN 1000 MCG/ML IJ SOLN
1000.0000 ug | Freq: Once | INTRAMUSCULAR | Status: AC
  Administered 2018-07-10: 1000 ug via INTRAMUSCULAR
  Filled 2018-07-10: qty 1

## 2018-07-10 MED ORDER — INFLUENZA VAC SPLIT QUAD 0.5 ML IM SUSY
0.5000 mL | PREFILLED_SYRINGE | Freq: Once | INTRAMUSCULAR | Status: AC
  Administered 2018-07-10: 0.5 mL via INTRAMUSCULAR

## 2018-07-10 MED ORDER — INFLUENZA VAC SPLIT QUAD 0.5 ML IM SUSY: 0.5000 mL | PREFILLED_SYRINGE | Freq: Once | INTRAMUSCULAR | Status: DC

## 2018-07-10 NOTE — Addendum Note (Signed)
Addended by: Charlyne Petrin B on: 07/10/2018 03:40 PM   Modules accepted: Orders

## 2018-07-10 NOTE — Progress Notes (Signed)
[place], [place] [address]   CLINIC:  Medical Oncology/Hematology  PCP:  [name], PA-C [place] [address] [phone]   REASON FOR VISIT: Follow-up for iron deficiency anemia  CURRENT THERAPY: Intermittent iron infusions.    INTERVAL HISTORY:  Tammy Mcpherson 62 y.o. adult returns for routine follow-up for iron deficiency anemia. Patient is here today with her family. She is feeling fatigued and weak. She had 2 units of blood on 07/04/18. She denies any blood in her stool or dark stools. She does have diarrhea occasionally. Denies fevers, night sweats, or chills. She reports her appetite at 75%. She is maintaining her weight. Her energy level is 25%.     REVIEW OF SYSTEMS:  Review of Systems  Constitutional: Positive for fatigue.  Neurological: Positive for extremity weakness.  All other systems reviewed and are negative.    PAST MEDICAL/SURGICAL HISTORY:  Past Medical History:  Diagnosis Date  . Anemia 04/30/2016  . Anxiety   . Blood transfusion without reported diagnosis   . Cardiomyopathy ([place])    a. EF 40-45% by echo in 04/2016 b. Improved to 60-65% by repeat imaging in 2018  . CHF (congestive heart failure) ([place])   . COPD (chronic obstructive pulmonary disease) ([place])   . Coronary artery calcification seen on CT scan   . Essential hypertension   . GERD (gastroesophageal reflux disease)   . History of bronchitis   . History of GI bleed   . Hyperlipidemia   . Iron deficiency anemia 05/12/2016   Bleeding ulcers  . Pollen allergy   . PSVT (paroxysmal supraventricular tachycardia) ([place])   . PVC's (premature ventricular contractions)    Past Surgical History:  Procedure Laterality Date  . ABDOMINAL HYSTERECTOMY     polyps  about age 89  . Barbie Banner OSTEOTOMY Right 07/10/2013   Procedure: Barbie Banner OSTEOTOMY RIGHT FOOT;  Surgeon: Marcheta Grammes, DPM;  Location: [facility];  Service: Orthopedics;   Laterality: Right;  . AMPUTATION Left 05/09/2017   Procedure: AMPUTATION 5TH TOE LEFT FOOT;  Surgeon: [name], DPM;  Location: [facility];  Service: Podiatry;  Laterality: Left;  . AMPUTATION Left 06/13/2017   Procedure: AMPUTATION 2ND TOE LEFT FOOT;  Surgeon: [name], DPM;  Location: [facility];  Service: Podiatry;  Laterality: Left;  . BUNIONECTOMY Right 07/10/2013   Procedure: VOGLER BUNIONECTOMY RIGHT FOOT;  Surgeon: Marcheta Grammes, DPM;  Location: [facility];  Service: Orthopedics;  Laterality: Right;  . CHOLECYSTECTOMY N/A 08/22/2017   Procedure: LAPAROSCOPIC CHOLECYSTECTOMY;  Surgeon: Virl Cagey, MD;  Location: [facility];  Service: General;  Laterality: N/A;  . COLONOSCOPY N/A 07/07/2016   Dr. Oneida Alar; redundant left colon, diverticulosis at hepatic flexure, non-bleeding internal hemorrhoids  . ESOPHAGOGASTRODUODENOSCOPY N/A 07/07/2016   Dr. Oneida Alar: many non-bleeding cratered gastric ulcers without stigmata of bleeding in gastric antrum. four 2-3 mm angioectasias without bleeding in duodenal bulb and second portion of duodenum s/p APC. Chroni gastritis on path.   . ESOPHAGOGASTRODUODENOSCOPY N/A 08/03/2017   Dr. Oneida Alar: erosive gastritis, AVMs. Found a single non-bleeding angioectasia in stomach, s/p APC therapy. Four non-bleeding angioectasias in duodenum s/p APC. Non-bleeding erosive gastropathy  . IR ANGIOGRAM EXTREMITY LEFT  04/24/2017  . IR FEM POP ART ATHERECT INC PTA MOD SED  04/24/2017  . IR INFUSION THROMBOL ARTERIAL INITIAL (MS)  04/24/2017  . IR RADIOLOGIST EVAL & MGMT  12/05/2016  . IR US GUIDE VASC ACCESS RIGHT  04/24/2017  .  LIVER BIOPSY N/A 08/22/2017   Procedure: LIVER BIOPSY;  Surgeon: Virl Cagey, MD;  Location: [facility];  Service: General;  Laterality: N/A;  . METATARSAL HEAD EXCISION Right 07/10/2013   Procedure: METATARSAL HEAD RESECTION OF DIGITS 2 AND 3 RIGHT FOOT;  Surgeon: Marcheta Grammes, DPM;  Location: [facility];  Service: Orthopedics;   Laterality: Right;  . PROXIMAL INTERPHALANGEAL FUSION (PIP) Right 07/10/2013   Procedure: ARTHRODESIS PIPJ  2ND DIGIT RIGHT FOOT;  Surgeon: Marcheta Grammes, DPM;  Location: [facility];  Service: Orthopedics;  Laterality: Right;     SOCIAL HISTORY:  Social History   Socioeconomic History  . Marital status: Married    Spouse name: [name]  . Number of children: 1  . Years of education: 65  . Highest education level: Not on file  Occupational History  . Occupation: disabled    Comment: heart  Social Needs  . Financial resource strain: Not on file  . Food insecurity:    Worry: Not on file    Inability: Not on file  . Transportation needs:    Medical: Not on file    Non-medical: Not on file  Tobacco Use  . Smoking status: Current Every Day Smoker    Packs/day: 0.50    Years: 44.00    Pack years: 22.00    Types: Cigarettes    Start date: 05/10/1975  . Smokeless tobacco: Never Used  . Tobacco comment: 1 pack per 2 days   Substance and Sexual Activity  . Alcohol use: Yes    Alcohol/week: 2.0 standard drinks    Types: 2 Glasses of wine per week  . Drug use: No  . Sexual activity: Yes    Birth control/protection: Surgical  Lifestyle  . Physical activity:    Days per week: Not on file    Minutes per session: Not on file  . Stress: Not on file  Relationships  . Social connections:    Talks on phone: Not on file    Gets together: Not on file    Attends religious service: Not on file    Active member of club or organization: Not on file    Attends meetings of clubs or organizations: Not on file    Relationship status: Not on file  . Intimate partner violence:    Fear of current or ex partner: Not on file    Emotionally abused: Not on file    Physically abused: Not on file    Forced sexual activity: Not on file  Other Topics Concern  . Not on file  Social History Narrative   Disabled   Lives with husband [name]   Two dogs    FAMILY HISTORY:  Family History  Adopted:  Yes  Problem Relation Age of Onset  . Hypertension Father   . Coronary artery disease Sister   . Ulcers Maternal Grandmother   . Colon cancer Neg Hx        unknown, was adopted    CURRENT MEDICATIONS:  Outpatient Encounter Medications as of 07/10/2018  Medication Sig  . acetaminophen (TYLENOL) 500 MG tablet Take 500 mg by mouth at bedtime as needed for moderate pain.  Marland Kitchen albuterol (PROAIR HFA) 108 (90 Base) MCG/ACT inhaler INHALE 2 PUFFS INTO THE LUNGS EVERY 6 HOURS AS NEEDED FOR WHEEZING OR SHORTNESS OF BREATH.  Marland Kitchen aspirin 81 MG chewable tablet Chew 81 mg by mouth daily.  Marland Kitchen atorvastatin (LIPITOR) 20 MG tablet Take 1 tablet (20 mg total) by mouth daily.  Marland Kitchen  bisoprolol (ZEBETA) 10 MG tablet Take 1 tablet (10 mg total) by mouth daily.  . cholecalciferol (VITAMIN D) 1000 units tablet Take 1,000 Units by mouth daily.   . clopidogrel (PLAVIX) 75 MG tablet Take 1 tablet (75 mg total) by mouth daily.  Marland Kitchen docusate sodium (COLACE) 100 MG capsule Take 1 capsule (100 mg total) by mouth 2 (two) times daily. (Patient not taking: Reported on 07/02/2018)  . furosemide (LASIX) 20 MG tablet Take 1 tablet (20 mg total) by mouth daily as needed for fluid.  Marland Kitchen losartan (COZAAR) 100 MG tablet TAKE ONE (1) TABLET BY MOUTH EVERY DAY  . Multiple Vitamin (MULTIVITAMIN WITH MINERALS) TABS tablet Take 1 tablet by mouth daily.  . pantoprazole (PROTONIX) 40 MG tablet Take 1 tablet (40 mg total) by mouth daily.  Marland Kitchen spironolactone (ALDACTONE) 25 MG tablet Take 0.5 tablets (12.5 mg total) by mouth daily.  . [DISCONTINUED] clopidogrel (PLAVIX) 75 MG tablet Take 1 tablet (75 mg total) by mouth daily.   Facility-Administered Encounter Medications as of 07/10/2018  Medication  . [COMPLETED] cyanocobalamin ((VITAMIN B-12)) injection 1,000 mcg  . Influenza vac split quadrivalent PF (FLUARIX) injection 0.5 mL    ALLERGIES:  Allergies  Allergen Reactions  . Bee Venom Swelling  . Codeine Anaphylaxis    Tongue swelled  .  Penicillins Swelling and Rash    Has patient had a PCN reaction causing immediate rash, facial/tongue/throat swelling, SOB or lightheadedness with hypotension: No, delayed Has patient had a PCN reaction causing severe rash involving mucus membranes or skin necrosis: No Has patient had a PCN reaction that required hospitalization No Has patient had a PCN reaction occurring within the last 10 years: No If all of the above answers are "NO", then may proceed with Cephalosporin use.   . Bupropion Other (See Comments)    "Made my skin crawl"  . Sudafed [Pseudoephedrine Hcl] Rash     PHYSICAL EXAM:  ECOG Performance status: 1  Vitals:   07/10/18 1450  BP: (!) 156/48  Pulse: 84  Resp: 20  Temp: 98 F (36.7 C)  SpO2: 99%   Filed Weights   07/10/18 1450  Weight: 162 lb (73.5 kg)    Physical Exam  Constitutional: She is oriented to person, place, and time. She appears well-developed and well-nourished.  Cardiovascular: Normal rate, regular rhythm and normal heart sounds.  Pulmonary/Chest: Effort normal and breath sounds normal.  Musculoskeletal: Normal range of motion.  Neurological: She is alert and oriented to person, place, and time.  Skin: Skin is warm and dry.  Psychiatric: She has a normal mood and affect. Her behavior is normal. Judgment and thought content normal.     LABORATORY DATA:  I have reviewed the labs as listed.  CBC    Component Value Date/Time   WBC 16.1 (H) 07/03/2018 1305   RBC 2.81 (L) 07/03/2018 1305   HGB 6.4 (LL) 07/03/2018 1305   HCT 22.6 (L) 07/03/2018 1305   PLT 685 (H) 07/03/2018 1305   MCV 80.4 07/03/2018 1305   MCH 22.8 (L) 07/03/2018 1305   MCHC 28.3 (L) 07/03/2018 1305   RDW 20.0 (H) 07/03/2018 1305   LYMPHSABS 1.7 07/03/2018 1305   MONOABS 1.9 (H) 07/03/2018 1305   EOSABS 0.2 07/03/2018 1305   BASOSABS 0.0 07/03/2018 1305   CMP Latest Ref Rng & Units 04/02/2018 03/20/2018 02/28/2018  Glucose 70 - 99 mg/dL 176(H) 214(H) 98  BUN 6 - 20  mg/dL 12 11 10   Creatinine 0.44 - 1.00 mg/dL  0.86 0.83 0.77  Sodium 135 - 145 mmol/L 137 135 143  Potassium 3.5 - 5.1 mmol/L 4.0 4.2 4.4  Chloride 98 - 111 mmol/L 107 104 105  CO2 22 - 32 mmol/L 23 20(L) 19(L)  Calcium 8.9 - 10.3 mg/dL 9.0 8.9 9.5  Total Protein 6.5 - 8.1 g/dL 7.4 - 7.0  Total Bilirubin 0.3 - 1.2 mg/dL 0.2(L) - <0.2  Alkaline Phos 38 - 126 U/L 118 - 120(H)  AST 15 - 41 U/L 14(L) - 12  ALT 0 - 44 U/L 14 - 13          ASSESSMENT & PLAN:   Anemia, iron deficiency 1.  Iron deficiency anemia: - Etiology is chronic GI loss from gastric and small bowel AVMs, patient on aspirin and Plavix.  History of having received blood transfusions. - Last Feraheme infusion was on 04/12/2018 and 04/19/2018. - Her hemoglobin dropped to 6.4.  She was having dark stools for the past few weeks.  We have given her 2 units of blood transfusion.  Her ferritin also dropped to 6 from 14 in July.  I have recommended 2 more infusions of Feraheme.  We will have to watch her closely and she will likely need Feraheme once a month. -She was told to contact her PMD or cardiologist to see if she can come off of Plavix.  She thinks she was placed on Plavix for peripheral vascular disease.  2.  Elevated [place] count and platelet count: -Her Magner count has been elevated since 2014, predominantly neutrophils.  She also has elevated platelet count. -I have done Jak 2 V6 81F mutation testing which was negative.  BCR/ABL by FISH was pending at this time.      Orders placed this encounter:  Orders Placed This Encounter  Procedures  . CBC with Differential/Platelet  . Comprehensive metabolic panel  . Ferritin  . Iron and TIBC  . CBC with Differential/Platelet  . Comprehensive metabolic panel      Derek Jack, MD [place] [phone]

## 2018-07-10 NOTE — Assessment & Plan Note (Signed)
1.  Iron deficiency anemia: - Etiology is chronic GI loss from gastric and small bowel AVMs, patient on aspirin and Plavix.  History of having received blood transfusions. - Last Feraheme infusion was on 04/12/2018 and 04/19/2018. - Her hemoglobin dropped to 6.4.  She was having dark stools for the past few weeks.  We have given her 2 units of blood transfusion.  Her ferritin also dropped to 6 from 14 in July.  I have recommended 2 more infusions of Feraheme.  We will have to watch her closely and she will likely need Feraheme once a month. -She was told to contact her PMD or cardiologist to see if she can come off of Plavix.  She thinks she was placed on Plavix for peripheral vascular disease.  2.  Elevated Savant count and platelet count: -Her Tarr count has been elevated since 2014, predominantly neutrophils.  She also has elevated platelet count. -I have done Jak 2 V6 35F mutation testing which was negative.  BCR/ABL by FISH was pending at this time.

## 2018-07-10 NOTE — Progress Notes (Signed)
Tammy Mcpherson presents today for injection per the provider's orders.  B12 administration without incident; see MAR for injection details.  Patient tolerated procedure well and without incident.  No questions or complaints noted at this time.  Discharged ambulatory.

## 2018-07-10 NOTE — Patient Instructions (Signed)
[facility] at [facility] Discharge Instructions  Follow up in 4 weeks with labs    Thank you for choosing [facility] at [facility] to provide your oncology and hematology care.  To afford each patient quality time with our provider, please arrive at least 15 minutes before your scheduled appointment time.   If you have a lab appointment with the Cancer Center please come in thru the  Main Entrance and check in at the main information desk  You need to re-schedule your appointment should you arrive 10 or more minutes late.  We strive to give you quality time with our providers, and arriving late affects you and other patients whose appointments are after yours.  Also, if you no show three or more times for appointments you may be dismissed from the clinic at the providers discretion.     Again, thank you for choosing [facility].  Our hope is that these requests will decrease the amount of time that you wait before being seen by our physicians.       _____________________________________________________________  Should you have questions after your visit to [facility], please contact our office at [phone] between the hours of 8:00 a.m. and 4:30 p.m.  Voicemails left after 4:00 p.m. will not be returned until the following business day.  For prescription refill requests, have your pharmacy contact our office and allow 72 hours.    Cancer Center Support Programs:   > Cancer Support Group  2nd Tuesday of the month 1pm-2pm, Journey Room    

## 2018-07-11 ENCOUNTER — Encounter: Payer: Self-pay | Admitting: Gastroenterology

## 2018-07-11 ENCOUNTER — Encounter (HOSPITAL_COMMUNITY): Payer: Self-pay

## 2018-07-11 ENCOUNTER — Inpatient Hospital Stay (HOSPITAL_COMMUNITY): Payer: BLUE CROSS/BLUE SHIELD

## 2018-07-11 DIAGNOSIS — D509 Iron deficiency anemia, unspecified: Secondary | ICD-10-CM

## 2018-07-11 DIAGNOSIS — D500 Iron deficiency anemia secondary to blood loss (chronic): Secondary | ICD-10-CM | POA: Diagnosis not present

## 2018-07-11 LAB — CBC WITH DIFFERENTIAL/PLATELET
Abs Immature Granulocytes: 0.13 10*3/uL — ABNORMAL HIGH (ref 0.00–0.07)
BASOS ABS: 0.1 10*3/uL (ref 0.0–0.1)
BASOS PCT: 0 %
EOS PCT: 1 %
Eosinophils Absolute: 0.2 10*3/uL (ref 0.0–0.5)
HCT: 30.8 % — ABNORMAL LOW (ref 36.0–46.0)
Hemoglobin: 8.8 g/dL — ABNORMAL LOW (ref 12.0–15.0)
IMMATURE GRANULOCYTES: 1 %
Lymphocytes Relative: 11 %
Lymphs Abs: 1.5 10*3/uL (ref 0.7–4.0)
MCH: 24 pg — ABNORMAL LOW (ref 26.0–34.0)
MCHC: 28.6 g/dL — AB (ref 30.0–36.0)
MCV: 83.9 fL (ref 80.0–100.0)
Monocytes Absolute: 1.3 10*3/uL — ABNORMAL HIGH (ref 0.1–1.0)
Monocytes Relative: 10 %
NEUTROS PCT: 77 %
NRBC: 0 % (ref 0.0–0.2)
Neutro Abs: 10.7 10*3/uL — ABNORMAL HIGH (ref 1.7–7.7)
PLATELETS: 521 10*3/uL — AB (ref 150–400)
RBC: 3.67 MIL/uL — ABNORMAL LOW (ref 3.87–5.11)
RDW: 19.9 % — ABNORMAL HIGH (ref 11.5–15.5)
WBC: 13.9 10*3/uL — ABNORMAL HIGH (ref 4.0–10.5)

## 2018-07-11 LAB — COMPREHENSIVE METABOLIC PANEL
ALT: 15 U/L (ref 0–44)
AST: 14 U/L — AB (ref 15–41)
Albumin: 3.4 g/dL — ABNORMAL LOW (ref 3.5–5.0)
Alkaline Phosphatase: 119 U/L (ref 38–126)
Anion gap: 7 (ref 5–15)
BUN: 8 mg/dL (ref 6–20)
CALCIUM: 8.8 mg/dL — AB (ref 8.9–10.3)
CO2: 23 mmol/L (ref 22–32)
CREATININE: 0.68 mg/dL (ref 0.44–1.00)
Chloride: 108 mmol/L (ref 98–111)
GFR calc Af Amer: >60 mL/min (ref >60)
GFR calc non Af Amer: >60 mL/min (ref >60)
Glucose, Bld: 189 mg/dL — ABNORMAL HIGH (ref 70–99)
Potassium: 4 mmol/L (ref 3.5–5.1)
Sodium: 138 mmol/L (ref 135–145)
TOTAL PROTEIN: 7.2 g/dL (ref 6.5–8.1)
Total Bilirubin: 0.4 mg/dL (ref 0.3–1.2)

## 2018-07-11 MED ORDER — SODIUM CHLORIDE 0.9 % IV SOLN
510.0000 mg | Freq: Once | INTRAVENOUS | Status: AC
  Administered 2018-07-11: 510 mg via INTRAVENOUS
  Filled 2018-07-11: qty 17

## 2018-07-11 MED ORDER — SODIUM CHLORIDE 0.9 % IV SOLN
INTRAVENOUS | Status: DC
  Administered 2018-07-11: 15:00:00 via INTRAVENOUS

## 2018-07-11 NOTE — Patient Instructions (Signed)
[facility] at [facility] Discharge Instructions  Received Feraheme infusion today. Follow-up as scheduled. Call clinic for any questions or concerns   Thank you for choosing [facility] at [facility] to provide your oncology and hematology care.  To afford each patient quality time with our provider, please arrive at least 15 minutes before your scheduled appointment time.   If you have a lab appointment with the Cancer Center please come in thru the  Main Entrance and check in at the main information desk  You need to re-schedule your appointment should you arrive 10 or more minutes late.  We strive to give you quality time with our providers, and arriving late affects you and other patients whose appointments are after yours.  Also, if you no show three or more times for appointments you may be dismissed from the clinic at the providers discretion.     Again, thank you for choosing [facility].  Our hope is that these requests will decrease the amount of time that you wait before being seen by our physicians.       _____________________________________________________________  Should you have questions after your visit to [facility], please contact our office at [phone] between the hours of 8:00 a.m. and 4:30 p.m.  Voicemails left after 4:00 p.m. will not be returned until the following business day.  For prescription refill requests, have your pharmacy contact our office and allow 72 hours.    Cancer Center Support Programs:   > Cancer Support Group  2nd Tuesday of the month 1pm-2pm, Journey Room   

## 2018-07-11 NOTE — Progress Notes (Signed)
Tammy Mcpherson tolerated Feraheme infusion well without complaints or incident. Lab results reviewed with Dr. Delton Coombes and no new orders obtained. VSS upon discharge. Pt discharged self ambulatory in satisfactory condition accompanied by her husband

## 2018-07-15 ENCOUNTER — Telehealth: Payer: Self-pay | Admitting: Gastroenterology

## 2018-07-15 LAB — BCR-ABL1 FISH
CELLS COUNTED: 200
Cells Analyzed: 200

## 2018-07-15 NOTE — Telephone Encounter (Signed)
Lmom, waiting on a return call.  

## 2018-07-15 NOTE — Telephone Encounter (Signed)
PATIENT CALLED AND WANTS [facility] TO LOOK AT HER MOST RECENT LAB WORK THAT IS IN THE SYSTEM.  SHE FEELS Tammy Mcpherson

## 2018-07-16 ENCOUNTER — Telehealth: Payer: Self-pay | Admitting: Gastroenterology

## 2018-07-16 ENCOUNTER — Encounter: Payer: Self-pay | Admitting: Gastroenterology

## 2018-07-16 ENCOUNTER — Other Ambulatory Visit: Payer: Self-pay

## 2018-07-16 ENCOUNTER — Telehealth (HOSPITAL_COMMUNITY): Payer: Self-pay

## 2018-07-16 DIAGNOSIS — R197 Diarrhea, unspecified: Secondary | ICD-10-CM

## 2018-07-16 NOTE — Telephone Encounter (Signed)
Pt called to cancel her OV with SF on 10/23 at 1130 because she has to take her husband to the doctor. She said she would follow up in January. She also wanted DS to know that her cardiologist said it was ok to stop her Plavix.

## 2018-07-16 NOTE — Telephone Encounter (Signed)
Pt is aware and orders were faxed to [facility] lab.

## 2018-07-16 NOTE — Telephone Encounter (Signed)
PLEASE CALL PT. She STRICTLY AVOID DAIRY. SUBMIT STOOL FOR C DIFF PCR. AND USE IMODIUM TID 30 MINS PRIOR TO MEALS. HOLD FOR CONSTIPATION.

## 2018-07-16 NOTE — Telephone Encounter (Signed)
FYI to Dr. Fields.  

## 2018-07-16 NOTE — Telephone Encounter (Signed)
Pt called to get results of FISH and JAK 2 Genotype test. Results were printed and reviewed with Dr. Delton Coombes and pt was informed that both test were negative. Pt verbalized understanding

## 2018-07-16 NOTE — Telephone Encounter (Signed)
Please call patient back [phone]

## 2018-07-16 NOTE — Telephone Encounter (Signed)
I called pt and it went straight to VM. Left message for a return call to discuss.  Pt was last seen by Dr. Oneida Alar on 02/07/2018. Last seen by oncology on 07/10/2018. Labs were ordered by oncology. Will forward this FYI to Dr. Oneida Alar since I have been unable to speak to pt.

## 2018-07-16 NOTE — Telephone Encounter (Signed)
PLEASE CALL PT. She needs to talk to her cardiologist regarding the need for both ASA and PLAVIX. Those meds make it more likely that she will bleed from her AVMs. SHE NEEDS TO CONTINUE TO FOLLOW WITH HEMATOLOGY. THERE IS NO OTHER WAY TO MANAGE HER AVMs. IF SHE DEVELOPS BLACK STOOL THAT LOOKS LIKE TAR OR RECTAL BLEEDING SHE SHOULD CALL THE OFC.  SHE CAN COME SEE ME IN THE OFC oct 23@1130a , Dx: GASTRIC/DUODENAL AVMS. WE MAY NEED TO REPEAT EGD TO CAUTERIZE HER AVMs.

## 2018-07-16 NOTE — Telephone Encounter (Signed)
REVIEWED. CHECK CBC IN ONE MO.

## 2018-07-16 NOTE — Telephone Encounter (Signed)
PT is aware. OK to put in slot for 07/24/2018 at 11:30 AM. Pt did want Dr. Oneida Alar to know that everything she eats goes straight through her.

## 2018-07-17 ENCOUNTER — Other Ambulatory Visit: Payer: Self-pay

## 2018-07-17 ENCOUNTER — Telehealth: Payer: Self-pay | Admitting: Gastroenterology

## 2018-07-17 ENCOUNTER — Other Ambulatory Visit (HOSPITAL_COMMUNITY): Payer: Self-pay | Admitting: Hematology

## 2018-07-17 DIAGNOSIS — D649 Anemia, unspecified: Secondary | ICD-10-CM

## 2018-07-17 NOTE — Telephone Encounter (Signed)
Pt's husband, [name], called with questions about patient being able to have lab work done either today or tomorrow. She is scheduled tomorrow for an iron infusion. [phone]

## 2018-07-17 NOTE — Telephone Encounter (Signed)
PT's husband is aware she will need CBC in one month, see separate note. He is also aware her orders for the stool test was faxed to [facility] and she needs to stop in and get container and then do specimen and return to lab.

## 2018-07-17 NOTE — Telephone Encounter (Signed)
PATIENT SCHEDULED IN THAT APPOINTMENT SLOT

## 2018-07-18 ENCOUNTER — Inpatient Hospital Stay (HOSPITAL_COMMUNITY): Payer: BLUE CROSS/BLUE SHIELD

## 2018-07-18 ENCOUNTER — Other Ambulatory Visit (HOSPITAL_COMMUNITY)
Admission: RE | Admit: 2018-07-18 | Discharge: 2018-07-18 | Disposition: A | Discharge status: Home / Self Care | Payer: BLUE CROSS/BLUE SHIELD | Source: Ambulatory Visit | Attending: Gastroenterology | Admitting: Gastroenterology

## 2018-07-18 ENCOUNTER — Other Ambulatory Visit: Payer: Self-pay

## 2018-07-18 ENCOUNTER — Encounter (HOSPITAL_COMMUNITY): Payer: Self-pay

## 2018-07-18 VITALS — BP 150/61 | HR 92 | Temp 97.6°F | Resp 18

## 2018-07-18 DIAGNOSIS — R197 Diarrhea, unspecified: Secondary | ICD-10-CM | POA: Diagnosis not present

## 2018-07-18 DIAGNOSIS — D500 Iron deficiency anemia secondary to blood loss (chronic): Secondary | ICD-10-CM | POA: Diagnosis not present

## 2018-07-18 DIAGNOSIS — D508 Other iron deficiency anemias: Secondary | ICD-10-CM

## 2018-07-18 LAB — C DIFFICILE QUICK SCREEN W PCR REFLEX
C DIFFICLE (CDIFF) ANTIGEN: NEGATIVE
C Diff interpretation: NOT DETECTED
C Diff toxin: NEGATIVE

## 2018-07-18 MED ORDER — SODIUM CHLORIDE 0.9 % IV SOLN
510.0000 mg | Freq: Once | INTRAVENOUS | Status: AC
  Administered 2018-07-18: 510 mg via INTRAVENOUS
  Filled 2018-07-18: qty 17

## 2018-07-18 MED ORDER — SODIUM CHLORIDE 0.9 % IV SOLN
INTRAVENOUS | Status: DC
  Administered 2018-07-18: 14:00:00 via INTRAVENOUS

## 2018-07-18 NOTE — Progress Notes (Signed)
Tolerated infusion w/o adverse reaction.  Alert, in no distress.  VSS.  Discharged ambulatory in c/o spouse.  

## 2018-07-22 ENCOUNTER — Other Ambulatory Visit: Payer: Self-pay | Admitting: Gastroenterology

## 2018-07-22 NOTE — Progress Notes (Signed)
Pt is aware of results. Said diarrhea is much better and she now has it only 3-4 times a day and it is not as watery as it was. Her appt is in Jan.

## 2018-07-23 ENCOUNTER — Encounter: Payer: Self-pay | Admitting: Physician Assistant

## 2018-07-23 ENCOUNTER — Other Ambulatory Visit: Payer: Self-pay

## 2018-07-23 ENCOUNTER — Ambulatory Visit: Payer: BLUE CROSS/BLUE SHIELD | Admitting: Physician Assistant

## 2018-07-23 VITALS — BP 164/72 | HR 100 | Temp 98.0°F | Resp 18 | Ht 65.0 in | Wt 160.0 lb

## 2018-07-23 DIAGNOSIS — I10 Essential (primary) hypertension: Secondary | ICD-10-CM

## 2018-07-23 DIAGNOSIS — K31819 Angiodysplasia of stomach and duodenum without bleeding: Secondary | ICD-10-CM | POA: Diagnosis not present

## 2018-07-23 DIAGNOSIS — K279 Peptic ulcer, site unspecified, unspecified as acute or chronic, without hemorrhage or perforation: Secondary | ICD-10-CM

## 2018-07-23 DIAGNOSIS — F17200 Nicotine dependence, unspecified, uncomplicated: Secondary | ICD-10-CM | POA: Diagnosis not present

## 2018-07-23 DIAGNOSIS — J449 Chronic obstructive pulmonary disease, unspecified: Secondary | ICD-10-CM

## 2018-07-23 MED ORDER — PANTOPRAZOLE SODIUM 40 MG PO TBEC: DELAYED_RELEASE_TABLET | ORAL | 3 refills | Status: DC

## 2018-07-23 NOTE — Patient Instructions (Addendum)
Please continue with spiriva at this time. I have placed a referral to pulmonology for further evaluation. I have provided you refills for protonix. Follow up with me in 3-6 months.  In terms of elevated blood pressure, I would like you to check your blood pressure at least a couple times over the next week outside of the office and document these values. It is best if you check the blood pressure at different times in the day. Your goal is <140/90. If your values are consistently above this goal, please return to office for further evaluation. If you start to have chest pain, blurred vision, shortness of breath, severe headache, lower leg swelling, or nausea/vomiting please seek care immediately here or at the ED.     If you have lab work done today you will be contacted with your lab results within the next 2 weeks.  If you have not heard from [place] then please contact us. The fastest way to get your results is to register for My Chart.   IF you received an x-ray today, you will receive an invoice from [facility]. Please contact [facility] at [phone] with questions or concerns regarding your invoice.   IF you received labwork today, you will receive an invoice from [facility]. Please contact [facility] at [phone] with questions or concerns regarding your invoice.   Our billing staff will not be able to assist you with questions regarding bills from these companies.  You will be contacted with the lab results as soon as they are available. The fastest way to get your results is to activate your My Chart account. Instructions are located on the last page of this paperwork. If you have not heard from [place] regarding the results in 2 weeks, please contact this office.

## 2018-07-23 NOTE — Progress Notes (Signed)
Tammy Mcpherson  MRN: [id number] DOB: 11/22/1955  Subjective:  Tammy Mcpherson is a 62 y.o. adult seen in office today for a chief complaint of f/u on COPD and med refill for protonix.   Last visit on 03/26/18: Started on inhalers for COPD.  Currently taking Spiriva twice daily and albuterol as needed.  Typically has to use albuterol once a day.  Feels about 50% better in terms of her lung symptoms.  She has a history of anemia and always feels tired. It is hard to determine if her shortness of breath, fatigue, and difficulty performing ADLs is because of her anemia or her COPD.  Her cough has improved.  Still feels wheezing occasionally.  Continues to smoke about half a pack per day.  Takes Protonix 40 mg daily for PUD and AVM of small bowel.  Followed closely by GI.  Does not have an appointment with them for a few months and is already out of her Protonix.  Up-to-date on endoscopy and colonoscopy.  Was told that she would have to be on this daily.  Followed closely by cardiology for hypertension, cardiomyopathy,and CAD.   Was recently taken off Plavix due to chronic GI bleed and denies that she was evaluated by vascular specialist and not found to have any significant arterial insufficiency.  Review of Systems  Constitutional: Negative for chills, diaphoresis and fever.  Cardiovascular: Negative for chest pain and leg swelling.  Neurological: Negative for dizziness and light-headedness.    Patient Active Problem List   Diagnosis Date Noted  . Prediabetes 03/01/2018  . Diarrhea 02/07/2018  . Gallbladder mass   . Lesion of liver   . Gastric AVM   . AVM (arteriovenous malformation) of small bowel, acquired   . Vitamin D deficiency 01/15/2017  . PAD (peripheral artery disease) ([place]) 01/15/2017  . Aortic atherosclerosis ([place]) 01/01/2017  . Gastric ulcer 01/01/2017  . Hyperlipidemia 01/01/2017  . Dyspnea on effort 01/01/2017  . Calculus of gallbladder without cholecystitis without  obstruction 06/22/2016  . Anemia, iron deficiency 05/12/2016  . Nonischemic cardiomyopathy ([place]) 05/02/2016  . CAD-Ca++ coronaries on CTA 05/02/2016  . Acute combined systolic and diastolic heart failure ([place]) 04/30/2016  . Chronic obstructive pulmonary disease ([place]) 04/30/2016  . Essential hypertension 04/30/2016  . Tobacco use disorder 04/30/2016    Current Outpatient Medications on File Prior to Visit  Medication Sig Dispense Refill  . acetaminophen (TYLENOL) 500 MG tablet Take 500 mg by mouth at bedtime as needed for moderate pain.    Marland Kitchen albuterol (PROAIR HFA) 108 (90 Base) MCG/ACT inhaler INHALE 2 PUFFS INTO THE LUNGS EVERY 6 HOURS AS NEEDED FOR WHEEZING OR SHORTNESS OF BREATH. 18 g 3  . aspirin 81 MG chewable tablet Chew 81 mg by mouth daily.    Marland Kitchen atorvastatin (LIPITOR) 20 MG tablet Take 1 tablet (20 mg total) by mouth daily. 90 tablet 3  . bisoprolol (ZEBETA) 10 MG tablet Take 1 tablet (10 mg total) by mouth daily. 90 tablet 3  . cholecalciferol (VITAMIN D) 1000 units tablet Take 1,000 Units by mouth daily.     Marland Kitchen losartan (COZAAR) 100 MG tablet TAKE ONE (1) TABLET BY MOUTH EVERY DAY 90 tablet 0  . Multiple Vitamin (MULTIVITAMIN WITH MINERALS) TABS tablet Take 1 tablet by mouth daily.    . pantoprazole (PROTONIX) 40 MG tablet TAKE ONE (1) TABLET BY MOUTH EVERY DAY 90 tablet 3  . clopidogrel (PLAVIX) 75 MG tablet Take 1 tablet (75 mg total) by mouth  daily. (Patient not taking: Reported on 07/23/2018) 90 tablet 1  . docusate sodium (COLACE) 100 MG capsule Take 1 capsule (100 mg total) by mouth 2 (two) times daily. (Patient not taking: Reported on 07/02/2018) 60 capsule 2  . furosemide (LASIX) 20 MG tablet Take 1 tablet (20 mg total) by mouth daily as needed for fluid. (Patient not taking: Reported on 07/23/2018) 30 tablet 1  . spironolactone (ALDACTONE) 25 MG tablet Take 0.5 tablets (12.5 mg total) by mouth daily. 45 tablet 3   No current facility-administered medications on file prior to  visit.     Allergies  Allergen Reactions  . Bee Venom Swelling  . Codeine Anaphylaxis    Tongue swelled  . Penicillins Swelling and Rash    Has patient had a PCN reaction causing immediate rash, facial/tongue/throat swelling, SOB or lightheadedness with hypotension: No, delayed Has patient had a PCN reaction causing severe rash involving mucus membranes or skin necrosis: No Has patient had a PCN reaction that required hospitalization No Has patient had a PCN reaction occurring within the last 10 years: No If all of the above answers are "NO", then may proceed with Cephalosporin use.   . Bupropion Other (See Comments)    "Made my skin crawl"  . Sudafed [Pseudoephedrine Hcl] Rash     Objective:  BP (!) 187/69   Pulse 100   Temp 98 F (36.7 C) (Oral)   Resp 18   Ht 5\' 5"  (1.651 m)   Wt 160 lb (72.6 kg)   SpO2 96%   BMI 26.63 kg/m   Physical Exam Constitutional: She is oriented to person, place, and time. She appears well-developed and well-nourished.  Appears older than stated age. Smells of tobacco.    HENT:  Head: Normocephalic and atraumatic.  Eyes: Conjunctivae are normal.  Neck: Normal range of motion.  Cardiovascular: Normal rate, regular rhythm and normal heart sounds.  Pulmonary/Chest: Effort normal. No accessory muscle usage. No respiratory distress. She has decreased breath sounds ( Diminished breath sounds auscultated bilaterally.). She has no wheezes. She has no rhonchi. She has no rales.  Neurological: She is alert and oriented to person, place, and time.  Skin: Skin is warm and dry.  Psychiatric: She has a normal mood and affect.  Vitals reviewed.   CAT questionairre score of 14. mMRC score of 2.  Assessment and Plan :  1. COPD, group B, by GOLD 2017 classification ([place]) Pt has had some improvement with spiriva, which is reassuring.  Attempted to get peak flow today but poor effort on patient's behalf.  Refused after 3 attempts.  CAT questionnaire has  improved.  She would likely benefit from lama/laba combo.  However, with her extensive PMH of cardiomyopathy, CAD, PSVT, PVCs, and uncontrolled HTN, would like pulmonology's opinion as to which inhaler would be the safest option for patient.  Referral has been placed.  - Care order/instruction - Ambulatory referral to Pulmonology  2. Tobacco use disorder Not interested in quitting at this time.  3. Essential hypertension Uncontrolled. Asymptomatic. Instructed to check bp outside of office over the next couple of weeks. Return if consistently >140/90. Given strict ED precautions.   4. Gastric AVM Refills provided. Follow up with GI as planned.  - pantoprazole (PROTONIX) 40 MG tablet; TAKE ONE (1) TABLET BY MOUTH EVERY DAY  Dispense: 90 tablet; Refill: 3  5. Peptic ulcer disease - pantoprazole (PROTONIX) 40 MG tablet; TAKE ONE (1) TABLET BY MOUTH EVERY DAY  Dispense: 90 tablet; Refill: 3  Tenna Delaine PA-C  [facility] 07/23/2018 2:37 PM

## 2018-07-24 ENCOUNTER — Ambulatory Visit: Payer: BLUE CROSS/BLUE SHIELD | Admitting: Gastroenterology

## 2018-07-28 ENCOUNTER — Encounter: Payer: Self-pay | Admitting: Physician Assistant

## 2018-08-08 ENCOUNTER — Other Ambulatory Visit (HOSPITAL_COMMUNITY)
Admission: RE | Admit: 2018-08-08 | Discharge: 2018-08-08 | Disposition: A | Discharge status: Home / Self Care | Payer: BLUE CROSS/BLUE SHIELD | Source: Ambulatory Visit | Attending: Gastroenterology | Admitting: Gastroenterology

## 2018-08-08 ENCOUNTER — Inpatient Hospital Stay (HOSPITAL_COMMUNITY): Payer: BLUE CROSS/BLUE SHIELD | Attending: Hematology

## 2018-08-08 ENCOUNTER — Telehealth: Payer: Self-pay | Admitting: Gastroenterology

## 2018-08-08 DIAGNOSIS — K5521 Angiodysplasia of colon with hemorrhage: Secondary | ICD-10-CM | POA: Diagnosis not present

## 2018-08-08 DIAGNOSIS — Z7982 Long term (current) use of aspirin: Secondary | ICD-10-CM | POA: Diagnosis not present

## 2018-08-08 DIAGNOSIS — D500 Iron deficiency anemia secondary to blood loss (chronic): Secondary | ICD-10-CM | POA: Insufficient documentation

## 2018-08-08 DIAGNOSIS — D72819 Decreased white blood cell count, unspecified: Secondary | ICD-10-CM | POA: Diagnosis not present

## 2018-08-08 DIAGNOSIS — Z7902 Long term (current) use of antithrombotics/antiplatelets: Secondary | ICD-10-CM | POA: Insufficient documentation

## 2018-08-08 DIAGNOSIS — R7989 Other specified abnormal findings of blood chemistry: Secondary | ICD-10-CM | POA: Insufficient documentation

## 2018-08-08 DIAGNOSIS — D509 Iron deficiency anemia, unspecified: Secondary | ICD-10-CM

## 2018-08-08 DIAGNOSIS — D649 Anemia, unspecified: Secondary | ICD-10-CM | POA: Insufficient documentation

## 2018-08-08 LAB — CBC WITH DIFFERENTIAL/PLATELET
Abs Immature Granulocytes: 0.09 10*3/uL — ABNORMAL HIGH (ref 0.00–0.07)
BASOS PCT: 0 %
Basophils Absolute: 0.1 10*3/uL (ref 0.0–0.1)
EOS ABS: 0.1 10*3/uL (ref 0.0–0.5)
EOS PCT: 1 %
HEMATOCRIT: 41.6 % (ref 36.0–46.0)
Hemoglobin: 12.7 g/dL (ref 12.0–15.0)
Immature Granulocytes: 1 %
Lymphocytes Relative: 14 %
Lymphs Abs: 1.6 10*3/uL (ref 0.7–4.0)
MCH: 28 pg (ref 26.0–34.0)
MCHC: 30.5 g/dL (ref 30.0–36.0)
MCV: 91.6 fL (ref 80.0–100.0)
MONO ABS: 1.1 10*3/uL — AB (ref 0.1–1.0)
MONOS PCT: 9 %
Neutro Abs: 8.8 10*3/uL — ABNORMAL HIGH (ref 1.7–7.7)
Neutrophils Relative %: 75 %
PLATELETS: 387 10*3/uL (ref 150–400)
RBC: 4.54 MIL/uL (ref 3.87–5.11)
RDW: 22.5 % — AB (ref 11.5–15.5)
WBC: 11.8 10*3/uL — AB (ref 4.0–10.5)
nRBC: 0 % (ref 0.0–0.2)

## 2018-08-08 LAB — COMPREHENSIVE METABOLIC PANEL
ALT: 20 U/L (ref 0–44)
AST: 18 U/L (ref 15–41)
Albumin: 3.7 g/dL (ref 3.5–5.0)
Alkaline Phosphatase: 109 U/L (ref 38–126)
Anion gap: 9 (ref 5–15)
BILIRUBIN TOTAL: 0.3 mg/dL (ref 0.3–1.2)
BUN: 9 mg/dL (ref 6–20)
CHLORIDE: 106 mmol/L (ref 98–111)
CO2: 22 mmol/L (ref 22–32)
Calcium: 9.4 mg/dL (ref 8.9–10.3)
Creatinine, Ser: 0.71 mg/dL (ref 0.44–1.00)
GFR calc non Af Amer: >60 mL/min (ref >60)
GLUCOSE: 155 mg/dL — AB (ref 70–99)
POTASSIUM: 4 mmol/L (ref 3.5–5.1)
Sodium: 137 mmol/L (ref 135–145)
Total Protein: 7.5 g/dL (ref 6.5–8.1)

## 2018-08-08 LAB — IRON AND TIBC
Iron: 53 ug/dL (ref 28–170)
SATURATION RATIOS: 15 % (ref 10.4–31.8)
TIBC: 348 ug/dL (ref 250–450)
UIBC: 295 ug/dL

## 2018-08-08 LAB — FERRITIN: Ferritin: 114 ng/mL (ref 11–307)

## 2018-08-08 NOTE — Telephone Encounter (Signed)
PT is aware.

## 2018-08-08 NOTE — Telephone Encounter (Signed)
LMOM for a return call.  

## 2018-08-08 NOTE — Telephone Encounter (Signed)
[place], SHE HAS QUESTIONS ABOUT LABS

## 2018-08-08 NOTE — Telephone Encounter (Signed)
PLEASE CALL PT. HER RED CELL BLOOD COUNT IS NORMAL.

## 2018-08-08 NOTE — Telephone Encounter (Signed)
Pt just wanted to let us know that she had blood work done for another doctor this Am and to let Dr. Oneida Alar know the CBC was done.

## 2018-08-09 ENCOUNTER — Inpatient Hospital Stay (HOSPITAL_COMMUNITY): Payer: BLUE CROSS/BLUE SHIELD

## 2018-08-09 ENCOUNTER — Encounter (HOSPITAL_COMMUNITY): Payer: Self-pay | Admitting: Hematology

## 2018-08-09 ENCOUNTER — Other Ambulatory Visit: Payer: Self-pay | Admitting: Physician Assistant

## 2018-08-09 ENCOUNTER — Inpatient Hospital Stay (HOSPITAL_BASED_OUTPATIENT_CLINIC_OR_DEPARTMENT_OTHER): Payer: BLUE CROSS/BLUE SHIELD | Admitting: Hematology

## 2018-08-09 ENCOUNTER — Other Ambulatory Visit: Payer: Self-pay

## 2018-08-09 VITALS — BP 149/65 | HR 90 | Temp 98.2°F | Resp 18 | Wt 160.1 lb

## 2018-08-09 DIAGNOSIS — D508 Other iron deficiency anemias: Secondary | ICD-10-CM

## 2018-08-09 DIAGNOSIS — K5521 Angiodysplasia of colon with hemorrhage: Secondary | ICD-10-CM | POA: Diagnosis not present

## 2018-08-09 DIAGNOSIS — Z7982 Long term (current) use of aspirin: Secondary | ICD-10-CM

## 2018-08-09 DIAGNOSIS — D509 Iron deficiency anemia, unspecified: Secondary | ICD-10-CM

## 2018-08-09 DIAGNOSIS — D72829 Elevated white blood cell count, unspecified: Secondary | ICD-10-CM

## 2018-08-09 DIAGNOSIS — Z7902 Long term (current) use of antithrombotics/antiplatelets: Secondary | ICD-10-CM

## 2018-08-09 DIAGNOSIS — D500 Iron deficiency anemia secondary to blood loss (chronic): Secondary | ICD-10-CM

## 2018-08-09 DIAGNOSIS — R7989 Other specified abnormal findings of blood chemistry: Secondary | ICD-10-CM

## 2018-08-09 MED ORDER — CYANOCOBALAMIN 1000 MCG/ML IJ SOLN
INTRAMUSCULAR | Status: AC
  Filled 2018-08-09: qty 1

## 2018-08-09 MED ORDER — ALBUTEROL SULFATE HFA 108 (90 BASE) MCG/ACT IN AERS: INHALATION_SPRAY | RESPIRATORY_TRACT | 3 refills | Status: DC

## 2018-08-09 MED ORDER — CYANOCOBALAMIN 1000 MCG/ML IJ SOLN
1000.0000 ug | Freq: Once | INTRAMUSCULAR | Status: AC
  Administered 2018-08-09: 1000 ug via INTRAMUSCULAR

## 2018-08-09 NOTE — Telephone Encounter (Signed)
Requested medication (s) are due for refill today: yes  Requested medication (s) are on the active medication list: yes for albuterol but not spiriva  Last refill:  By a different provider  Future visit scheduled: No  Notes to clinic:  LOV on 07/23/18 with Brittany,PA. Office notes mention for pt to continue Spiriva but medication not on current medication list. Pt is asking for a refill of albuterol inhaler and spiriva before Brittany,PA leaves the office.     Requested Prescriptions  Pending Prescriptions Disp Refills   albuterol (PROAIR HFA) 108 (90 Base) MCG/ACT inhaler 18 g 3    Sig: INHALE 2 PUFFS INTO THE LUNGS EVERY 6 HOURS AS NEEDED FOR WHEEZING OR SHORTNESS OF BREATH.     Pulmonology:  Beta Agonists Failed - 08/09/2018  4:01 PM      Failed - One inhaler should last at least one month. If the patient is requesting refills earlier, contact the patient to check for uncontrolled symptoms.      Passed - Valid encounter within last 12 months    Recent Outpatient Visits          2 weeks ago COPD, group B, by GOLD 2017 classification [facility])   [facility] [place], [place] D, PA-C   4 months ago COPD, group B, by GOLD 2017 classification [facility])   [facility] [place], [place] D, PA-C   5 months ago Essential hypertension   [facility], Reather Laurence, PA-C      Future Appointments            In 2 months Derek Jack, MD [facility]

## 2018-08-09 NOTE — Progress Notes (Signed)
Tammy Mcpherson presents today for injection per the provider's orders.  B12 administration without incident; see MAR for injection details.  Patient tolerated procedure well and without incident.  No questions or complaints noted at this time.  Discharged ambulatory in c/o spouse.

## 2018-08-09 NOTE — Assessment & Plan Note (Signed)
1.  Iron deficiency anemia: - Etiology is chronic GI loss from gastric and small bowel AVMs, patient was on aspirin and Plavix.  History of having received blood transfusions. - Last Feraheme infusion was on 04/12/2018 and 04/19/2018. - She had received 2 more infusions of Feraheme on 07/11/2018 on 07/18/2018. -Her hemoglobin has improved to 12.7 from 8.6.  She is also feeling very well. -Her Plavix was discontinued around mid-October.  She is currently on aspirin 81 mg daily. -She does not require any parenteral iron at this time.  She will come back in 2 months with repeat blood work.   2.  Elevated [place] count and platelet count: -Her Strassner count has been elevated since 2014, predominantly neutrophils.  She also has elevated platelet count. - BCR/ABL by FISH and Jak 2 V6 76F testing were negative ruling out myeloproliferative disorders.

## 2018-08-09 NOTE — Telephone Encounter (Signed)
Copied from [place] [id number]. Topic: Quick Communication - Rx Refill/Question >> Aug 09, 2018  3:44 PM [place], [place] D wrote: Medication: Pt would like to know if the following can be refilled before [facility]'s last day. Please advise. albuterol (PROAIR HFA) 108 (90 Base) MCG/ACT inhaler / Spiriva?  Has the patient contacted their pharmacy? No. (Agent: If no, request that the patient contact the pharmacy for the refill.) (Agent: If yes, when and what did the pharmacy advise?)  Preferred Pharmacy (with phone number or street name): [facility], [place] [phone] (Phone) [phone] (Fax)    Agent: Please be advised that RX refills may take up to 3 business days. We ask that you follow-up with your pharmacy.

## 2018-08-09 NOTE — Patient Instructions (Signed)
[facility] at [facility] Discharge Instructions  Follow up in 2 months with labs prior to your visit.    Thank you for choosing [facility] at [facility] to provide your oncology and hematology care.  To afford each patient quality time with our provider, please arrive at least 15 minutes before your scheduled appointment time.   If you have a lab appointment with the Cancer Center please come in thru the  Main Entrance and check in at the main information desk  You need to re-schedule your appointment should you arrive 10 or more minutes late.  We strive to give you quality time with our providers, and arriving late affects you and other patients whose appointments are after yours.  Also, if you no show three or more times for appointments you may be dismissed from the clinic at the providers discretion.     Again, thank you for choosing [facility].  Our hope is that these requests will decrease the amount of time that you wait before being seen by our physicians.       _____________________________________________________________  Should you have questions after your visit to [facility], please contact our office at [phone] between the hours of 8:00 a.m. and 4:30 p.m.  Voicemails left after 4:00 p.m. will not be returned until the following business day.  For prescription refill requests, have your pharmacy contact our office and allow 72 hours.    Cancer Center Support Programs:   > Cancer Support Group  2nd Tuesday of the month 1pm-2pm, Journey Room    

## 2018-08-09 NOTE — Progress Notes (Signed)
[address] [place], [place] [address]   CLINIC:  Medical Oncology/Hematology  PCP:  [name], PA-C [place] [address] [phone]   REASON FOR VISIT: Follow-up for iron deficency anemia  CURRENT THERAPY: intermittent iron infusions.    INTERVAL HISTORY:  Ms. Hamme 62 y.o. adult returns for routine follow-up iron deficiency anemia. She is here today with her son and doing great. She reports her GI dr took her off blood thinners and she has felt great ever since. She has no complaints and reports more energy and able to do more of her own activities. She denies any bleeding or dark stools. Denies any nausea, vomiting, or diarrhea. Denies any new pains. Denies any easy bruising. She reports her appetite at 100% and her energy level at 50%.     REVIEW OF SYSTEMS:  Review of Systems  All other systems reviewed and are negative.    PAST MEDICAL/SURGICAL HISTORY:  Past Medical History:  Diagnosis Date  . Anemia 04/30/2016  . Anxiety   . Blood transfusion without reported diagnosis   . Cardiomyopathy ([place])    a. EF 40-45% by echo in 04/2016 b. Improved to 60-65% by repeat imaging in 2018  . CHF (congestive heart failure) ([place])   . COPD (chronic obstructive pulmonary disease) ([place])   . Coronary artery calcification seen on CT scan   . Essential hypertension   . GERD (gastroesophageal reflux disease)   . History of bronchitis   . History of GI bleed   . Hyperlipidemia   . Iron deficiency anemia 05/12/2016   Bleeding ulcers  . Pollen allergy   . PSVT (paroxysmal supraventricular tachycardia) ([place])   . PVC's (premature ventricular contractions)    Past Surgical History:  Procedure Laterality Date  . ABDOMINAL HYSTERECTOMY     polyps  about age 65  . Barbie Banner OSTEOTOMY Right 07/10/2013   Procedure: Barbie Banner OSTEOTOMY RIGHT FOOT;  Surgeon: Marcheta Grammes, DPM;  Location: [facility];  Service: Orthopedics;  Laterality:  Right;  . AMPUTATION Left 05/09/2017   Procedure: AMPUTATION 5TH TOE LEFT FOOT;  Surgeon: [name], DPM;  Location: [facility];  Service: Podiatry;  Laterality: Left;  . AMPUTATION Left 06/13/2017   Procedure: AMPUTATION 2ND TOE LEFT FOOT;  Surgeon: [name], DPM;  Location: [facility];  Service: Podiatry;  Laterality: Left;  . BUNIONECTOMY Right 07/10/2013   Procedure: VOGLER BUNIONECTOMY RIGHT FOOT;  Surgeon: Marcheta Grammes, DPM;  Location: [facility];  Service: Orthopedics;  Laterality: Right;  . CHOLECYSTECTOMY N/A 08/22/2017   Procedure: LAPAROSCOPIC CHOLECYSTECTOMY;  Surgeon: Virl Cagey, MD;  Location: [facility];  Service: General;  Laterality: N/A;  . COLONOSCOPY N/A 07/07/2016   Dr. Oneida Alar; redundant left colon, diverticulosis at hepatic flexure, non-bleeding internal hemorrhoids  . ESOPHAGOGASTRODUODENOSCOPY N/A 07/07/2016   Dr. Oneida Alar: many non-bleeding cratered gastric ulcers without stigmata of bleeding in gastric antrum. four 2-3 mm angioectasias without bleeding in duodenal bulb and second portion of duodenum s/p APC. Chroni gastritis on path.   . ESOPHAGOGASTRODUODENOSCOPY N/A 08/03/2017   Dr. Oneida Alar: erosive gastritis, AVMs. Found a single non-bleeding angioectasia in stomach, s/p APC therapy. Four non-bleeding angioectasias in duodenum s/p APC. Non-bleeding erosive gastropathy  . IR ANGIOGRAM EXTREMITY LEFT  04/24/2017  . IR FEM POP ART ATHERECT INC PTA MOD SED  04/24/2017  . IR INFUSION THROMBOL ARTERIAL INITIAL (MS)  04/24/2017  . IR RADIOLOGIST EVAL & MGMT  12/05/2016  . IR US GUIDE  VASC ACCESS RIGHT  04/24/2017  . LIVER BIOPSY N/A 08/22/2017   Procedure: LIVER BIOPSY;  Surgeon: Virl Cagey, MD;  Location: [facility];  Service: General;  Laterality: N/A;  . METATARSAL HEAD EXCISION Right 07/10/2013   Procedure: METATARSAL HEAD RESECTION OF DIGITS 2 AND 3 RIGHT FOOT;  Surgeon: Marcheta Grammes, DPM;  Location: [facility];  Service: Orthopedics;  Laterality:  Right;  . PROXIMAL INTERPHALANGEAL FUSION (PIP) Right 07/10/2013   Procedure: ARTHRODESIS PIPJ  2ND DIGIT RIGHT FOOT;  Surgeon: Marcheta Grammes, DPM;  Location: [facility];  Service: Orthopedics;  Laterality: Right;     SOCIAL HISTORY:  Social History   Socioeconomic History  . Marital status: Married    Spouse name: [name]  . Number of children: 1  . Years of education: 59  . Highest education level: Not on file  Occupational History  . Occupation: disabled    Comment: heart  Social Needs  . Financial resource strain: Not on file  . Food insecurity:    Worry: Not on file    Inability: Not on file  . Transportation needs:    Medical: Not on file    Non-medical: Not on file  Tobacco Use  . Smoking status: Current Every Day Smoker    Packs/day: 0.50    Years: 44.00    Pack years: 22.00    Types: Cigarettes    Start date: 05/10/1975  . Smokeless tobacco: Never Used  . Tobacco comment: 1 pack per 2 days   Substance and Sexual Activity  . Alcohol use: Yes    Alcohol/week: 2.0 standard drinks    Types: 2 Glasses of wine per week  . Drug use: No  . Sexual activity: Yes    Birth control/protection: Surgical  Lifestyle  . Physical activity:    Days per week: Not on file    Minutes per session: Not on file  . Stress: Not on file  Relationships  . Social connections:    Talks on phone: Not on file    Gets together: Not on file    Attends religious service: Not on file    Active member of club or organization: Not on file    Attends meetings of clubs or organizations: Not on file    Relationship status: Not on file  . Intimate partner violence:    Fear of current or ex partner: Not on file    Emotionally abused: Not on file    Physically abused: Not on file    Forced sexual activity: Not on file  Other Topics Concern  . Not on file  Social History Narrative   Disabled   Lives with husband [name]   Two dogs    FAMILY HISTORY:  Family History  Adopted: Yes    Problem Relation Age of Onset  . Hypertension Father   . Coronary artery disease Sister   . Ulcers Maternal Grandmother   . Colon cancer Neg Hx        unknown, was adopted    CURRENT MEDICATIONS:  Outpatient Encounter Medications as of 08/09/2018  Medication Sig  . acetaminophen (TYLENOL) 500 MG tablet Take 500 mg by mouth at bedtime as needed for moderate pain.  Marland Kitchen aspirin 81 MG chewable tablet Chew 81 mg by mouth daily.  Marland Kitchen atorvastatin (LIPITOR) 20 MG tablet Take 1 tablet (20 mg total) by mouth daily.  . bisoprolol (ZEBETA) 10 MG tablet Take 1 tablet (10 mg total) by mouth daily.  . cholecalciferol (  VITAMIN D) 1000 units tablet Take 1,000 Units by mouth daily.   . clopidogrel (PLAVIX) 75 MG tablet Take 1 tablet (75 mg total) by mouth daily.  Marland Kitchen losartan (COZAAR) 100 MG tablet TAKE ONE (1) TABLET BY MOUTH EVERY DAY  . Multiple Vitamin (MULTIVITAMIN WITH MINERALS) TABS tablet Take 1 tablet by mouth daily.  . pantoprazole (PROTONIX) 40 MG tablet TAKE ONE (1) TABLET BY MOUTH EVERY DAY  . [DISCONTINUED] albuterol (PROAIR HFA) 108 (90 Base) MCG/ACT inhaler INHALE 2 PUFFS INTO THE LUNGS EVERY 6 HOURS AS NEEDED FOR WHEEZING OR SHORTNESS OF BREATH.  . [DISCONTINUED] docusate sodium (COLACE) 100 MG capsule Take 1 capsule (100 mg total) by mouth 2 (two) times daily.  . [DISCONTINUED] furosemide (LASIX) 20 MG tablet Take 1 tablet (20 mg total) by mouth daily as needed for fluid.  Marland Kitchen spironolactone (ALDACTONE) 25 MG tablet Take 0.5 tablets (12.5 mg total) by mouth daily.  . [EXPIRED] cyanocobalamin ((VITAMIN B-12)) injection 1,000 mcg    No facility-administered encounter medications on file as of 08/09/2018.     ALLERGIES:  Allergies  Allergen Reactions  . Bee Venom Swelling  . Codeine Anaphylaxis    Tongue swelled  . Penicillins Swelling and Rash    Has patient had a PCN reaction causing immediate rash, facial/tongue/throat swelling, SOB or lightheadedness with hypotension: No, delayed Has  patient had a PCN reaction causing severe rash involving mucus membranes or skin necrosis: No Has patient had a PCN reaction that required hospitalization No Has patient had a PCN reaction occurring within the last 10 years: No If all of the above answers are "NO", then may proceed with Cephalosporin use.   . Bupropion Other (See Comments)    "Made my skin crawl"  . Sudafed [Pseudoephedrine Hcl] Rash     PHYSICAL EXAM:  ECOG Performance status: 1  Vitals:   08/09/18 1050  BP: (!) 149/65  Pulse: 90  Resp: 18  Temp: 98.2 F (36.8 C)  SpO2: 99%   Filed Weights   08/09/18 1050  Weight: 160 lb 1.6 oz (72.6 kg)    Physical Exam  Constitutional: She is oriented to person, place, and time. She appears well-developed and well-nourished.  Musculoskeletal: Normal range of motion.  Neurological: She is alert and oriented to person, place, and time.  Skin: Skin is warm and dry.  Psychiatric: She has a normal mood and affect. Her behavior is normal. Judgment and thought content normal.     LABORATORY DATA:  I have reviewed the labs as listed.  CBC    Component Value Date/Time   WBC 11.8 (H) 08/08/2018 1234   RBC 4.54 08/08/2018 1234   HGB 12.7 08/08/2018 1234   HCT 41.6 08/08/2018 1234   PLT 387 08/08/2018 1234   MCV 91.6 08/08/2018 1234   MCH 28.0 08/08/2018 1234   MCHC 30.5 08/08/2018 1234   RDW 22.5 (H) 08/08/2018 1234   LYMPHSABS 1.6 08/08/2018 1234   MONOABS 1.1 (H) 08/08/2018 1234   EOSABS 0.1 08/08/2018 1234   BASOSABS 0.1 08/08/2018 1234   CMP Latest Ref Rng & Units 08/08/2018 07/11/2018 04/02/2018  Glucose 70 - 99 mg/dL 155(H) 189(H) 176(H)  BUN 6 - 20 mg/dL 9 8 12   Creatinine 0.44 - 1.00 mg/dL 0.71 0.68 0.86  Sodium 135 - 145 mmol/L 137 138 137  Potassium 3.5 - 5.1 mmol/L 4.0 4.0 4.0  Chloride 98 - 111 mmol/L 106 108 107  CO2 22 - 32 mmol/L 22 23 23   Calcium 8.9 -  10.3 mg/dL 9.4 8.8(L) 9.0  Total Protein 6.5 - 8.1 g/dL 7.5 7.2 7.4  Total Bilirubin 0.3 - 1.2  mg/dL 0.3 0.4 0.2(L)  Alkaline Phos 38 - 126 U/L 109 119 118  AST 15 - 41 U/L 18 14(L) 14(L)  ALT 0 - 44 U/L 20 15 14         ASSESSMENT & PLAN:   Anemia, iron deficiency 1.  Iron deficiency anemia: - Etiology is chronic GI loss from gastric and small bowel AVMs, patient was on aspirin and Plavix.  History of having received blood transfusions. - Last Feraheme infusion was on 04/12/2018 and 04/19/2018. - She had received 2 more infusions of Feraheme on 07/11/2018 on 07/18/2018. -Her hemoglobin has improved to 12.7 from 8.6.  She is also feeling very well. -Her Plavix was discontinued around mid-October.  She is currently on aspirin 81 mg daily. -She does not require any parenteral iron at this time.  She will come back in 2 months with repeat blood work.   2.  Elevated [place] count and platelet count: -Her [place]di count has been elevated since 2014, predominantly neutrophils.  She also has elevated platelet count. - BCR/ABL by FISH and Jak 2 V6 27F testing were negative ruling out myeloproliferative disorders.       Orders placed this encounter:  Orders Placed This Encounter  Procedures  . CBC with Differential/Platelet  . Comprehensive metabolic panel  . Ferritin  . Iron and TIBC  . Folate  . Vitamin B12      Derek Jack, [place] [phone]

## 2018-08-30 ENCOUNTER — Telehealth: Payer: Self-pay | Admitting: Physician Assistant

## 2018-08-30 ENCOUNTER — Other Ambulatory Visit: Payer: Self-pay | Admitting: Physician Assistant

## 2018-08-30 DIAGNOSIS — J449 Chronic obstructive pulmonary disease, unspecified: Secondary | ICD-10-CM

## 2018-08-30 NOTE — Telephone Encounter (Signed)
Requested medication (s) are due for refill today: yes  Requested medication (s) are on the active medication list: No, shows medication was discontinued in error  Last refill:  Last filled on 07/30/18  Future visit scheduled: yes  Notes to clinic:  Medication not on current med list    Requested Prescriptions  Pending Prescriptions Disp [place] 1.25 MCG/ACT AERS [Pharmacy Med Name: SPIRIVA RESPIMAT 1.25 MCG/ACT INH A] 4 g 3    Sig: INHALE TWO PUFFS INTO THE LUNGS DAILY     Pulmonology:  Anticholinergic Agents Passed - 08/30/2018 11:28 AM      Passed - Valid encounter within last 12 months    Recent Outpatient Visits          1 month ago COPD, group B, by GOLD 2017 classification [facility])   Primary [facility] at [place], [place] D, PA-C   5 months ago COPD, group B, by GOLD 2017 classification [facility])   Primary Care at [place], [place] D, PA-C   6 months ago Essential hypertension   Primary Care at [facility], Reather Laurence, PA-C      Future Appointments            In 1 month Derek Jack, MD [facility]

## 2018-08-30 NOTE — Telephone Encounter (Signed)
Pharmacy is calling about dosage on inhaler. Please review for possible change in Rx or change in pharmacy location.

## 2018-08-30 NOTE — Telephone Encounter (Signed)
Copied from [place] [phone]. Topic: Quick Communication - Rx Refill/Question >> Aug 30, 2018  4:14 PM Virl Axe D wrote: Medication: SPIRIVA RESPIMAT 1.25 MCG/ACT AERS / Pharmacy stated that there they do not have the 1.25 strength in stock and there is not another pharmacy in [place] that does. They want to know if the pt can wait until Monday 09/02/18 for an order to come in or would PCP prefer to change rx. Please advise  Has the patient contacted their pharmacy? Yes.   (Agent: If no, request that the patient contact the pharmacy for the refill.) (Agent: If yes, when and what did the pharmacy advise?)  Preferred Pharmacy (with phone number or street name): [facility] 1.25 MCG/ACT AERS  Agent: Please be advised that RX refills may take up to 3 business days. We ask that you follow-up with your pharmacy.

## 2018-08-31 NOTE — Telephone Encounter (Signed)
Medication Spiriva refilled on 08/30/18 by stallings. Dgaddy, CMA

## 2018-09-05 ENCOUNTER — Encounter: Payer: Self-pay | Admitting: Gastroenterology

## 2018-09-10 ENCOUNTER — Inpatient Hospital Stay (HOSPITAL_COMMUNITY): Payer: BLUE CROSS/BLUE SHIELD | Attending: Oncology

## 2018-09-10 ENCOUNTER — Institutional Professional Consult (permissible substitution): Payer: BLUE CROSS/BLUE SHIELD | Admitting: Internal Medicine

## 2018-09-10 VITALS — BP 181/72 | HR 75 | Temp 98.0°F | Resp 18

## 2018-09-10 DIAGNOSIS — D508 Other iron deficiency anemias: Secondary | ICD-10-CM | POA: Diagnosis present

## 2018-09-10 MED ORDER — CYANOCOBALAMIN 1000 MCG/ML IJ SOLN
1000.0000 ug | Freq: Once | INTRAMUSCULAR | Status: AC
  Administered 2018-09-10: 1000 ug via INTRAMUSCULAR

## 2018-09-10 NOTE — Patient Instructions (Signed)
[facility] at [facility] _______________________________________________________________  Thank you for choosing [facility] at [facility] to provide your oncology and hematology care.  To afford each patient quality time with our providers, please arrive at least 15 minutes before your scheduled appointment.  You need to re-schedule your appointment if you arrive 10 or more minutes late.  We strive to give you quality time with our providers, and arriving late affects you and other patients whose appointments are after yours.  Also, if you no show three or more times for appointments you may be dismissed from the clinic.  Again, thank you for choosing [facility] at [facility]. Our hope is that these requests will allow you access to exceptional care and in a timely manner. _______________________________________________________________  If you have questions after your visit, please contact our office at [phone] between the hours of 8:30 a.m. and 5:00 p.m. Voicemails left after 4:30 p.m. will not be returned until the following business day. _______________________________________________________________  For prescription refill requests, have your pharmacy contact our office. _______________________________________________________________  Recommendations made by the consultant and any test results will be sent to your referring physician. _______________________________________________________________ 

## 2018-09-10 NOTE — Progress Notes (Signed)
Tammy Mcpherson Summa tolerated B12 injection without incident or complaint. VSS, BP elevated but pt reports she only took one of her BP medications this morning. She states she will take her other BP medication when she gets home. Pt asked about having her husband do her B12 injections at home. I told her I would speak with the MD and get a B12 Rx sent to her pharmacy and that we will instruct her husband on proper procedure for administration. Pt verbalizes understanding. Discharged self ambulatory in satisfactory condition in presence of husband.

## 2018-10-04 ENCOUNTER — Telehealth: Payer: Self-pay

## 2018-10-04 NOTE — Telephone Encounter (Signed)
LMTCB. Please reschedule patients consult appt with BI. BI has Bronchs this day and his first time slot it at 230.

## 2018-10-08 ENCOUNTER — Inpatient Hospital Stay (HOSPITAL_COMMUNITY): Payer: PPO | Attending: Hematology

## 2018-10-08 DIAGNOSIS — D509 Iron deficiency anemia, unspecified: Secondary | ICD-10-CM | POA: Insufficient documentation

## 2018-10-08 LAB — CBC WITH DIFFERENTIAL/PLATELET
ABS IMMATURE GRANULOCYTES: 0.13 10*3/uL — AB (ref 0.00–0.07)
BASOS ABS: 0.1 10*3/uL (ref 0.0–0.1)
Basophils Relative: 0 %
EOS PCT: 1 %
Eosinophils Absolute: 0.2 10*3/uL (ref 0.0–0.5)
HEMATOCRIT: 42.3 % (ref 36.0–46.0)
HEMOGLOBIN: 13.3 g/dL (ref 12.0–15.0)
Immature Granulocytes: 1 %
LYMPHS ABS: 1.8 10*3/uL (ref 0.7–4.0)
LYMPHS PCT: 12 %
MCH: 29.5 pg (ref 26.0–34.0)
MCHC: 31.4 g/dL (ref 30.0–36.0)
MCV: 93.8 fL (ref 80.0–100.0)
MONO ABS: 1.4 10*3/uL — AB (ref 0.1–1.0)
Monocytes Relative: 9 %
NEUTROS ABS: 11.6 10*3/uL — AB (ref 1.7–7.7)
Neutrophils Relative %: 77 %
Platelets: 421 10*3/uL — ABNORMAL HIGH (ref 150–400)
RBC: 4.51 MIL/uL (ref 3.87–5.11)
RDW: 14.9 % (ref 11.5–15.5)
WBC: 15.1 10*3/uL — ABNORMAL HIGH (ref 4.0–10.5)
nRBC: 0 % (ref 0.0–0.2)

## 2018-10-08 LAB — COMPREHENSIVE METABOLIC PANEL
ALBUMIN: 3.7 g/dL (ref 3.5–5.0)
ALK PHOS: 109 U/L (ref 38–126)
ALT: 22 U/L (ref 0–44)
AST: 21 U/L (ref 15–41)
Anion gap: 9 (ref 5–15)
BILIRUBIN TOTAL: 0.4 mg/dL (ref 0.3–1.2)
BUN: 9 mg/dL (ref 8–23)
CO2: 21 mmol/L — AB (ref 22–32)
CREATININE: 0.74 mg/dL (ref 0.44–1.00)
Calcium: 9.3 mg/dL (ref 8.9–10.3)
Chloride: 107 mmol/L (ref 98–111)
GFR calc Af Amer: >60 mL/min (ref >60)
GFR calc non Af Amer: >60 mL/min (ref >60)
GLUCOSE: 250 mg/dL — AB (ref 70–99)
Potassium: 3.9 mmol/L (ref 3.5–5.1)
SODIUM: 137 mmol/L (ref 135–145)
TOTAL PROTEIN: 7.4 g/dL (ref 6.5–8.1)

## 2018-10-08 LAB — IRON AND TIBC
Iron: 31 ug/dL (ref 28–170)
Saturation Ratios: 8 % — ABNORMAL LOW (ref 10.4–31.8)
TIBC: 404 ug/dL (ref 250–450)
UIBC: 373 ug/dL

## 2018-10-08 LAB — FOLATE: FOLATE: 10 ng/mL (ref >5.9)

## 2018-10-08 LAB — VITAMIN B12: VITAMIN B 12: 421 pg/mL (ref 180–914)

## 2018-10-08 LAB — FERRITIN: Ferritin: 23 ng/mL (ref 11–307)

## 2018-10-09 ENCOUNTER — Ambulatory Visit: Payer: BLUE CROSS/BLUE SHIELD | Admitting: Gastroenterology

## 2018-10-10 ENCOUNTER — Ambulatory Visit: Payer: BLUE CROSS/BLUE SHIELD | Admitting: Cardiology

## 2018-10-10 ENCOUNTER — Encounter: Payer: Self-pay | Admitting: Cardiology

## 2018-10-10 VITALS — BP 146/78 | HR 91 | Ht 65.0 in | Wt 161.0 lb

## 2018-10-10 DIAGNOSIS — R42 Dizziness and giddiness: Secondary | ICD-10-CM | POA: Diagnosis not present

## 2018-10-10 DIAGNOSIS — Z720 Tobacco use: Secondary | ICD-10-CM

## 2018-10-10 DIAGNOSIS — I428 Other cardiomyopathies: Secondary | ICD-10-CM | POA: Diagnosis not present

## 2018-10-10 DIAGNOSIS — I10 Essential (primary) hypertension: Secondary | ICD-10-CM

## 2018-10-10 MED ORDER — BISOPROLOL FUMARATE 10 MG PO TABS: 10.0000 mg | ORAL_TABLET | Freq: Every day | ORAL | 3 refills | Status: DC

## 2018-10-10 MED ORDER — LOSARTAN POTASSIUM 100 MG PO TABS: ORAL_TABLET | ORAL | 1 refills | Status: DC

## 2018-10-10 NOTE — Progress Notes (Signed)
Cardiology Office Note  Date: 10/10/2018   ID: Tammy Mcpherson, DOB [birth year], MRN [id number]  PCP: [name], PA-C  Primary Cardiologist: Rozann Lesches, MD   Chief Complaint  Patient presents with  . Cardiac follow-up    History of Present Illness: Tammy Mcpherson is a 63 y.o. adult last seen in July 2019 by Ms. Strader PA-C.  She is here today with her husband for a follow-up visit.  She does not report any obvious angina symptoms, no palpitations or syncope.  She has had stable NYHA class II dyspnea on exertion.  She does report occasional episodes of lightheadedness, usually occurs with sudden standing or with turning her head suddenly.  Orthostatic measurements were obtained today in clinic and found to be normal.  She states that she has had similar symptoms in the past when found to be anemic, however had recent lab work obtained by her hematologist and hemoglobin is normal at 13.3.  She was seen by Dr. Donnetta Hutching in October 2019 with lower extremity Dopplers at that time demonstrating mild stenosis in the mid superficial femoral artery bilaterally.  It was not felt that her leg pain was related to arterial insufficiency, possibly neurogenic.  Reviewed her medications which are outlined below.  She needed refills for Cozaar and Zebeta.  Past Medical History:  Diagnosis Date  . Anemia 04/30/2016  . Anxiety   . Blood transfusion without reported diagnosis   . Cardiomyopathy ([place])    a. EF 40-45% by echo in 04/2016 b. Improved to 60-65% by repeat imaging in 2018  . CHF (congestive heart failure) ([place])   . COPD (chronic obstructive pulmonary disease) ([place])   . Coronary artery calcification seen on CT scan   . Essential hypertension   . GERD (gastroesophageal reflux disease)   . History of bronchitis   . History of GI bleed   . Hyperlipidemia   . Iron deficiency anemia 05/12/2016   Bleeding ulcers  . Pollen allergy   . PSVT (paroxysmal supraventricular tachycardia)  ([place])   . PVC's (premature ventricular contractions)     Past Surgical History:  Procedure Laterality Date  . ABDOMINAL HYSTERECTOMY     polyps  about age 71  . Barbie Banner OSTEOTOMY Right 07/10/2013   Procedure: Barbie Banner OSTEOTOMY RIGHT FOOT;  Surgeon: Marcheta Grammes, DPM;  Location: [facility];  Service: Orthopedics;  Laterality: Right;  . AMPUTATION Left 05/09/2017   Procedure: AMPUTATION 5TH TOE LEFT FOOT;  Surgeon: [name], DPM;  Location: [facility];  Service: Podiatry;  Laterality: Left;  . AMPUTATION Left 06/13/2017   Procedure: AMPUTATION 2ND TOE LEFT FOOT;  Surgeon: [name], DPM;  Location: [facility];  Service: Podiatry;  Laterality: Left;  . BUNIONECTOMY Right 07/10/2013   Procedure: VOGLER BUNIONECTOMY RIGHT FOOT;  Surgeon: Marcheta Grammes, DPM;  Location: [facility];  Service: Orthopedics;  Laterality: Right;  . CHOLECYSTECTOMY N/A 08/22/2017   Procedure: LAPAROSCOPIC CHOLECYSTECTOMY;  Surgeon: Virl Cagey, MD;  Location: [facility];  Service: General;  Laterality: N/A;  . COLONOSCOPY N/A 07/07/2016   Dr. Oneida Alar; redundant left colon, diverticulosis at hepatic flexure, non-bleeding internal hemorrhoids  . ESOPHAGOGASTRODUODENOSCOPY N/A 07/07/2016   Dr. Oneida Alar: many non-bleeding cratered gastric ulcers without stigmata of bleeding in gastric antrum. four 2-3 mm angioectasias without bleeding in duodenal bulb and second portion of duodenum s/p APC. Chroni gastritis on path.   . ESOPHAGOGASTRODUODENOSCOPY N/A 08/03/2017   Dr. Oneida Alar: erosive gastritis, AVMs. Found a single non-bleeding angioectasia in stomach,  s/p APC therapy. Four non-bleeding angioectasias in duodenum s/p APC. Non-bleeding erosive gastropathy  . IR ANGIOGRAM EXTREMITY LEFT  04/24/2017  . IR FEM POP ART ATHERECT INC PTA MOD SED  04/24/2017  . IR INFUSION THROMBOL ARTERIAL INITIAL (MS)  04/24/2017  . IR RADIOLOGIST EVAL & MGMT  12/05/2016  . IR US GUIDE VASC ACCESS RIGHT  04/24/2017  . LIVER BIOPSY N/A  08/22/2017   Procedure: LIVER BIOPSY;  Surgeon: Virl Cagey, MD;  Location: [facility];  Service: General;  Laterality: N/A;  . METATARSAL HEAD EXCISION Right 07/10/2013   Procedure: METATARSAL HEAD RESECTION OF DIGITS 2 AND 3 RIGHT FOOT;  Surgeon: Marcheta Grammes, DPM;  Location: [facility];  Service: Orthopedics;  Laterality: Right;  . PROXIMAL INTERPHALANGEAL FUSION (PIP) Right 07/10/2013   Procedure: ARTHRODESIS PIPJ  2ND DIGIT RIGHT FOOT;  Surgeon: Marcheta Grammes, DPM;  Location: [facility];  Service: Orthopedics;  Laterality: Right;    Current Outpatient Medications  Medication Sig Dispense Refill  . acetaminophen (TYLENOL) 500 MG tablet Take 500 mg by mouth at bedtime as needed for moderate pain.    Marland Kitchen albuterol (PROAIR HFA) 108 (90 Base) MCG/ACT inhaler INHALE 2 PUFFS INTO THE LUNGS EVERY 6 HOURS AS NEEDED FOR WHEEZING OR SHORTNESS OF BREATH. 18 g 3  . aspirin 81 MG chewable tablet Chew 81 mg by mouth daily.    Marland Kitchen atorvastatin (LIPITOR) 20 MG tablet Take 1 tablet (20 mg total) by mouth daily. 90 tablet 3  . cholecalciferol (VITAMIN D) 1000 units tablet Take 1,000 Units by mouth daily.     . Multiple Vitamin (MULTIVITAMIN WITH MINERALS) TABS tablet Take 1 tablet by mouth daily.    . pantoprazole (PROTONIX) 40 MG tablet TAKE ONE (1) TABLET BY MOUTH EVERY DAY 90 tablet 3  . SPIRIVA RESPIMAT 1.25 MCG/ACT AERS INHALE TWO PUFFS INTO THE LUNGS DAILY 4 g 3  . Tiotropium Bromide Monohydrate (SPIRIVA RESPIMAT IN) Inhale into the lungs.    . bisoprolol (ZEBETA) 10 MG tablet Take 1 tablet (10 mg total) by mouth daily. 90 tablet 3  . losartan (COZAAR) 100 MG tablet TAKE ONE (1) TABLET BY MOUTH EVERY DAY 90 tablet 1   No current facility-administered medications for this visit.    Allergies:  Bee venom; Codeine; Penicillins; Bupropion; and Sudafed [pseudoephedrine hcl]   Social History: The patient  reports that she has been smoking cigarettes. She started smoking about 43 years ago. She  has a 22.00 pack-year smoking history. She has never used smokeless tobacco. She reports current alcohol use of about 2.0 standard drinks of alcohol per week. She reports that she does not use drugs.   ROS:  Please see the history of present illness. Otherwise, complete review of systems is positive for intermittent leg and back pain.  All other systems are reviewed and negative.   Physical Exam: VS:  BP (!) 146/78 (BP Location: Left Arm, Cuff Size: Normal)   Pulse 91   Ht 5\' 5"  (1.651 m)   Wt 161 lb (73 kg)   SpO2 96%   BMI 26.79 kg/m , BMI Body mass index is 26.79 kg/m.  Wt Readings from Last 3 Encounters:  10/10/18 161 lb (73 kg)  08/09/18 160 lb 1.6 oz (72.6 kg)  07/23/18 160 lb (72.6 kg)    General: Chronically ill-appearing woman, no distress. HEENT: Conjunctiva and lids normal, oropharynx clear. Neck: Supple, no elevated JVP or carotid bruits, no thyromegaly. Lungs: Diminished breath sounds without wheezing, nonlabored  breathing at rest. Cardiac: Regular rate and rhythm, no S3, soft systolic murmur. Abdomen: Soft, nontender, bowel sounds present. Extremities: No pitting edema, distal pulses 2+. Skin: Warm and dry. Musculoskeletal: No kyphosis. Neuropsychiatric: Alert and oriented x3, affect grossly appropriate.  ECG: I personally reviewed the tracing from 03/14/2018 which showed normal sinus rhythm.  Recent Labwork: 10/08/2018: ALT 22; AST 21; BUN 9; Creatinine, Ser 0.74; Hemoglobin 13.3; Platelets 421; Potassium 3.9; Sodium 137     Component Value Date/Time   CHOL 148 09/28/2017 1201   TRIG 164 (H) 09/28/2017 1201   HDL 34 (L) 09/28/2017 1201   CHOLHDL 4.4 09/28/2017 1201   VLDL NOT CALC 03/30/2017 1520   LDLCALC 88 09/28/2017 1201    Other Studies Reviewed Today:  Echocardiogram 03/20/2018: Study Conclusions  - Left ventricle: The cavity size was normal. Wall thickness was   increased in a pattern of mild LVH. Systolic function was   vigorous. The estimated  ejection fraction was in the range of 65%   to 70%. Wall motion was normal; there were no regional wall   motion abnormalities. Doppler parameters are consistent with   abnormal left ventricular relaxation (grade 1 diastolic   dysfunction). Doppler parameters are consistent with   indeterminate ventricular filling pressure. - Mitral valve: Mildly thickened leaflets .  Impressions:  - LV systolic function is vigorous with grade 1 diastolic   dysfuntion. No significant changes when compared to study dated   08/13/2017.  Assessment and Plan:  1.  Nonischemic cardiomyopathy with normalization of LVEF on medical therapy.  Echocardiogram in June 2019 revealed LVEF 65 to 70% range.  Would continue with current regimen, refills provided for Cozaar and Zebeta.  2.  Intermittent lightheadedness, orthostatics were negative today.  States that this occurs with standing sometimes with turning her head, could potentially be more vertiginous in etiology.  Keep follow-up with PCP.  3.  Ongoing tobacco abuse, smoking cessation has been discussed.  4.  Essential hypertension, no medication changes were made today.  Current medicines were reviewed with the patient today.  Disposition: Follow-up in 6 months.  Signed, Satira Sark, MD, [facility] 10/10/2018 2:18 PM    [facility] [place], [place], [place] [address] Phone: [phone]; Fax: [phone]

## 2018-10-10 NOTE — Patient Instructions (Addendum)

## 2018-10-11 ENCOUNTER — Other Ambulatory Visit: Payer: Self-pay

## 2018-10-11 ENCOUNTER — Inpatient Hospital Stay (HOSPITAL_COMMUNITY): Payer: PPO | Attending: Hematology | Admitting: Hematology

## 2018-10-11 ENCOUNTER — Inpatient Hospital Stay (HOSPITAL_COMMUNITY): Payer: PPO

## 2018-10-11 ENCOUNTER — Encounter (HOSPITAL_COMMUNITY): Payer: Self-pay | Admitting: Hematology

## 2018-10-11 VITALS — BP 153/69 | HR 77 | Temp 98.1°F | Resp 17 | Wt 161.8 lb

## 2018-10-11 DIAGNOSIS — R42 Dizziness and giddiness: Secondary | ICD-10-CM | POA: Diagnosis not present

## 2018-10-11 DIAGNOSIS — D508 Other iron deficiency anemias: Secondary | ICD-10-CM

## 2018-10-11 DIAGNOSIS — D509 Iron deficiency anemia, unspecified: Secondary | ICD-10-CM | POA: Diagnosis not present

## 2018-10-11 DIAGNOSIS — Z79899 Other long term (current) drug therapy: Secondary | ICD-10-CM | POA: Diagnosis not present

## 2018-10-11 DIAGNOSIS — Z7982 Long term (current) use of aspirin: Secondary | ICD-10-CM | POA: Diagnosis not present

## 2018-10-11 DIAGNOSIS — F17210 Nicotine dependence, cigarettes, uncomplicated: Secondary | ICD-10-CM | POA: Diagnosis not present

## 2018-10-11 DIAGNOSIS — Z8249 Family history of ischemic heart disease and other diseases of the circulatory system: Secondary | ICD-10-CM | POA: Insufficient documentation

## 2018-10-11 MED ORDER — CYANOCOBALAMIN 1000 MCG/ML IJ SOLN
1000.0000 ug | Freq: Once | INTRAMUSCULAR | Status: AC
  Administered 2018-10-11: 1000 ug via INTRAMUSCULAR

## 2018-10-11 MED ORDER — CYANOCOBALAMIN 1000 MCG/ML IJ SOLN
1000.0000 ug | Freq: Once | INTRAMUSCULAR | Status: DC
  Filled 2018-10-11: qty 1

## 2018-10-11 NOTE — Assessment & Plan Note (Signed)
1.  Iron deficiency anemia: - Etiology is chronic GI loss from gastric and small bowel AVMs, patient was on aspirin and Plavix.  History of having received blood transfusions. -Last Feraheme infusion on 07/11/2018 and 07/18/2018. -She is continuing B12 injections monthly. -Plavix was discontinued in October 2019.  She is continuing aspirin 81 mg daily. -She denies any bleeding per rectum or melena.  She does complain of fatigue in the last 2 to 3 weeks. -We reviewed her blood work.  Ferritin has decreased to 23.  I have recommended 2 more infusions of Feraheme.  We will schedule it. -We will see her back in 4 months for follow-up with repeat blood work.  B12 was within normal limits today.  2.  Elevated [place] count and platelet count: -Her [place] count has been elevated since 2014, predominantly neutrophils.  She also has elevated platelet count. - BCR/ABL by FISH and Jak 2 V6 23F testing were negative ruling out myeloproliferative disorders.

## 2018-10-11 NOTE — Patient Instructions (Addendum)
[place] at [facility] Discharge Instructions   Follow up in 4 months with labs prior to your appointment.   Thank you for choosing [place] at [facility] to provide your oncology and hematology care.  To afford each patient quality time with our provider, please arrive at least 15 minutes before your scheduled appointment time.   If you have a lab appointment with the [place] please come in thru the  Main Entrance and check in at the main information desk  You need to re-schedule your appointment should you arrive 10 or more minutes late.  We strive to give you quality time with our providers, and arriving late affects you and other patients whose appointments are after yours.  Also, if you no show three or more times for appointments you may be dismissed from the clinic at the providers discretion.     Again, thank you for choosing [facility].  Our hope is that these requests will decrease the amount of time that you wait before being seen by our physicians.       _____________________________________________________________  Should you have questions after your visit to [facility], please contact our office at [phone] between the hours of 8:00 a.m. and 4:30 p.m.  Voicemails left after 4:00 p.m. will not be returned until the following business day.  For prescription refill requests, have your pharmacy contact our office and allow 72 hours.    Cancer Center Support Programs:   > Cancer Support Group  2nd Tuesday of the month 1pm-2pm, Journey Room

## 2018-10-11 NOTE — Progress Notes (Signed)
[address], [place] [address]   CLINIC:  Medical Oncology/Hematology  PCP:  [name], PA-C [place] [address] [phone]   REASON FOR VISIT: Follow-up for iron deficency anemia  CURRENT THERAPY: intermittent iron infusions.     INTERVAL HISTORY:  Tammy Mcpherson 63 y.o. adult returns for routine follow-up for iron deficiency anemia. She is here today with her son. She felt good after her last iron infusions. She is now starting to feel low energy and fatigued again. She is having dizzy episodes they have been going on for a week now. Denies any nausea, vomiting, or diarrhea. Denies any new pains. Had not noticed any recent bleeding such as epistaxis, hematuria or hematochezia. Denies recent chest pain on exertion, shortness of breath on minimal exertion, pre-syncopal episodes, or palpitations. Denies any numbness or tingling in hands or feet. Denies any recent fevers, infections, or recent hospitalizations. She reports her appetite and energy level at 100%. She is maintaining her weight at this time.    REVIEW OF SYSTEMS:  Review of Systems  Respiratory: Positive for cough and shortness of breath.   Neurological: Positive for dizziness.  All other systems reviewed and are negative.    PAST MEDICAL/SURGICAL HISTORY:  Past Medical History:  Diagnosis Date  . Anemia 04/30/2016  . Anxiety   . Blood transfusion without reported diagnosis   . Cardiomyopathy ([place])    a. EF 40-45% by echo in 04/2016 b. Improved to 60-65% by repeat imaging in 2018  . CHF (congestive heart failure) ([place])   . COPD (chronic obstructive pulmonary disease) ([place])   . Coronary artery calcification seen on CT scan   . Essential hypertension   . GERD (gastroesophageal reflux disease)   . History of bronchitis   . History of GI bleed   . Hyperlipidemia   . Iron deficiency anemia 05/12/2016   Bleeding ulcers  . Pollen allergy   . PSVT  (paroxysmal supraventricular tachycardia) ([place])   . PVC's (premature ventricular contractions)    Past Surgical History:  Procedure Laterality Date  . ABDOMINAL HYSTERECTOMY     polyps  about age 37  . Barbie Banner OSTEOTOMY Right 07/10/2013   Procedure: Barbie Banner OSTEOTOMY RIGHT FOOT;  Surgeon: Marcheta Grammes, DPM;  Location: [facility];  Service: Orthopedics;  Laterality: Right;  . AMPUTATION Left 05/09/2017   Procedure: AMPUTATION 5TH TOE LEFT FOOT;  Surgeon: [name], DPM;  Location: [facility];  Service: Podiatry;  Laterality: Left;  . AMPUTATION Left 06/13/2017   Procedure: AMPUTATION 2ND TOE LEFT FOOT;  Surgeon: [name], DPM;  Location: [facility];  Service: Podiatry;  Laterality: Left;  . BUNIONECTOMY Right 07/10/2013   Procedure: VOGLER BUNIONECTOMY RIGHT FOOT;  Surgeon: Marcheta Grammes, DPM;  Location: [facility];  Service: Orthopedics;  Laterality: Right;  . CHOLECYSTECTOMY N/A 08/22/2017   Procedure: LAPAROSCOPIC CHOLECYSTECTOMY;  Surgeon: Virl Cagey, MD;  Location: [facility];  Service: General;  Laterality: N/A;  . COLONOSCOPY N/A 07/07/2016   Dr. Oneida Alar; redundant left colon, diverticulosis at hepatic flexure, non-bleeding internal hemorrhoids  . ESOPHAGOGASTRODUODENOSCOPY N/A 07/07/2016   Dr. Oneida Alar: many non-bleeding cratered gastric ulcers without stigmata of bleeding in gastric antrum. four 2-3 mm angioectasias without bleeding in duodenal bulb and second portion of duodenum s/p APC. Chroni gastritis on path.   . ESOPHAGOGASTRODUODENOSCOPY N/A 08/03/2017   Dr. Oneida Alar: erosive gastritis, AVMs. Found a single non-bleeding angioectasia in stomach, s/p APC therapy. Four non-bleeding angioectasias in  duodenum s/p APC. Non-bleeding erosive gastropathy  . IR ANGIOGRAM EXTREMITY LEFT  04/24/2017  . IR FEM POP ART ATHERECT INC PTA MOD SED  04/24/2017  . IR INFUSION THROMBOL ARTERIAL INITIAL (MS)  04/24/2017  . IR RADIOLOGIST EVAL & MGMT  12/05/2016  . IR US GUIDE VASC ACCESS  RIGHT  04/24/2017  . LIVER BIOPSY N/A 08/22/2017   Procedure: LIVER BIOPSY;  Surgeon: Virl Cagey, MD;  Location: [facility];  Service: General;  Laterality: N/A;  . METATARSAL HEAD EXCISION Right 07/10/2013   Procedure: METATARSAL HEAD RESECTION OF DIGITS 2 AND 3 RIGHT FOOT;  Surgeon: Marcheta Grammes, DPM;  Location: [facility];  Service: Orthopedics;  Laterality: Right;  . PROXIMAL INTERPHALANGEAL FUSION (PIP) Right 07/10/2013   Procedure: ARTHRODESIS PIPJ  2ND DIGIT RIGHT FOOT;  Surgeon: Marcheta Grammes, DPM;  Location: [facility];  Service: Orthopedics;  Laterality: Right;     SOCIAL HISTORY:  Social History   Socioeconomic History  . Marital status: Married    Spouse name: [name]  . Number of children: 1  . Years of education: 84  . Highest education level: Not on file  Occupational History  . Occupation: disabled    Comment: heart  Social Needs  . Financial resource strain: Not on file  . Food insecurity:    Worry: Not on file    Inability: Not on file  . Transportation needs:    Medical: Not on file    Non-medical: Not on file  Tobacco Use  . Smoking status: Current Every Day Smoker    Packs/day: 0.50    Years: 44.00    Pack years: 22.00    Types: Cigarettes    Start date: 05/10/1975  . Smokeless tobacco: Never Used  . Tobacco comment: 1 pack per 2 days   Substance and Sexual Activity  . Alcohol use: Yes    Alcohol/week: 2.0 standard drinks    Types: 2 Glasses of wine per week  . Drug use: No  . Sexual activity: Yes    Birth control/protection: Surgical  Lifestyle  . Physical activity:    Days per week: Not on file    Minutes per session: Not on file  . Stress: Not on file  Relationships  . Social connections:    Talks on phone: Not on file    Gets together: Not on file    Attends religious service: Not on file    Active member of club or organization: Not on file    Attends meetings of clubs or organizations: Not on file    Relationship status: Not  on file  . Intimate partner violence:    Fear of current or ex partner: Not on file    Emotionally abused: Not on file    Physically abused: Not on file    Forced sexual activity: Not on file  Other Topics Concern  . Not on file  Social History Narrative   Disabled   Lives with husband [name]   Two dogs    FAMILY HISTORY:  Family History  Adopted: Yes  Problem Relation Age of Onset  . Hypertension Father   . Coronary artery disease Sister   . Ulcers Maternal Grandmother   . Colon cancer Neg Hx        unknown, was adopted    CURRENT MEDICATIONS:  Outpatient Encounter Medications as of 10/11/2018  Medication Sig  . acetaminophen (TYLENOL) 500 MG tablet Take 500 mg by mouth at bedtime as needed for  moderate pain.  Marland Kitchen albuterol (PROAIR HFA) 108 (90 Base) MCG/ACT inhaler INHALE 2 PUFFS INTO THE LUNGS EVERY 6 HOURS AS NEEDED FOR WHEEZING OR SHORTNESS OF BREATH.  Marland Kitchen aspirin 81 MG chewable tablet Chew 81 mg by mouth daily.  Marland Kitchen atorvastatin (LIPITOR) 20 MG tablet Take 1 tablet (20 mg total) by mouth daily.  . bisoprolol (ZEBETA) 10 MG tablet Take 1 tablet (10 mg total) by mouth daily.  Marland Kitchen losartan (COZAAR) 100 MG tablet TAKE ONE (1) TABLET BY MOUTH EVERY DAY  . Multiple Vitamin (MULTIVITAMIN WITH MINERALS) TABS tablet Take 1 tablet by mouth daily.  . pantoprazole (PROTONIX) 40 MG tablet TAKE ONE (1) TABLET BY MOUTH EVERY DAY  . SPIRIVA RESPIMAT 1.25 MCG/ACT AERS INHALE TWO PUFFS INTO THE LUNGS DAILY  . Tiotropium Bromide Monohydrate (SPIRIVA RESPIMAT IN) Inhale into the lungs.  . cholecalciferol (VITAMIN D) 1000 units tablet Take 1,000 Units by mouth daily.    Facility-Administered Encounter Medications as of 10/11/2018  Medication  . [COMPLETED] cyanocobalamin ((VITAMIN B-12)) injection 1,000 mcg    ALLERGIES:  Allergies  Allergen Reactions  . Bee Venom Swelling  . Codeine Anaphylaxis    Tongue swelled  . Penicillins Swelling and Rash    Has patient had a PCN reaction causing  immediate rash, facial/tongue/throat swelling, SOB or lightheadedness with hypotension: No, delayed Has patient had a PCN reaction causing severe rash involving mucus membranes or skin necrosis: No Has patient had a PCN reaction that required hospitalization No Has patient had a PCN reaction occurring within the last 10 years: No If all of the above answers are "NO", then may proceed with Cephalosporin use.   . Bupropion Other (See Comments)    "Made my skin crawl"  . Sudafed [Pseudoephedrine Hcl] Rash     PHYSICAL EXAM:  ECOG Performance status: 1  Vitals:   10/11/18 1125  BP: (!) 153/69  Pulse: 77  Resp: 17  Temp: 98.1 F (36.7 C)  SpO2: 96%   Filed Weights   10/11/18 1125  Weight: 161 lb 12.8 oz (73.4 kg)    Physical Exam Constitutional:      Appearance: Normal appearance. She is normal weight.  Musculoskeletal: Normal range of motion.  Skin:    General: Skin is warm and dry.  Neurological:     Mental Status: She is alert and oriented to person, place, and time. Mental status is at baseline.  Psychiatric:        Mood and Affect: Mood normal.        Behavior: Behavior normal.        Thought Content: Thought content normal.        Judgment: Judgment normal.      LABORATORY DATA:  I have reviewed the labs as listed.  CBC    Component Value Date/Time   WBC 15.1 (H) 10/08/2018 1110   RBC 4.51 10/08/2018 1110   HGB 13.3 10/08/2018 1110   HCT 42.3 10/08/2018 1110   PLT 421 (H) 10/08/2018 1110   MCV 93.8 10/08/2018 1110   MCH 29.5 10/08/2018 1110   MCHC 31.4 10/08/2018 1110   RDW 14.9 10/08/2018 1110   LYMPHSABS 1.8 10/08/2018 1110   MONOABS 1.4 (H) 10/08/2018 1110   EOSABS 0.2 10/08/2018 1110   BASOSABS 0.1 10/08/2018 1110   CMP Latest Ref Rng & Units 10/08/2018 08/08/2018 07/11/2018  Glucose 70 - 99 mg/dL 250(H) 155(H) 189(H)  BUN 8 - 23 mg/dL 9 9 8   Creatinine 0.44 - 1.00 mg/dL 0.74 0.71  0.68  Sodium 135 - 145 mmol/L 137 137 138  Potassium 3.5 - 5.1  mmol/L 3.9 4.0 4.0  Chloride 98 - 111 mmol/L 107 106 108  CO2 22 - 32 mmol/L 21(L) 22 23  Calcium 8.9 - 10.3 mg/dL 9.3 9.4 8.8(L)  Total Protein 6.5 - 8.1 g/dL 7.4 7.5 7.2  Total Bilirubin 0.3 - 1.2 mg/dL 0.4 0.3 0.4  Alkaline Phos 38 - 126 U/L 109 109 119  AST 15 - 41 U/L 21 18 14(L)  ALT 0 - 44 U/L 22 20 15        DIAGNOSTIC IMAGING:  I have independently reviewed the scans and discussed with the patient.   I have reviewed Francene Finders, NP's note and agree with the documentation.  I personally performed a face-to-face visit, made revisions and my assessment and plan is as follows.    ASSESSMENT & PLAN:   Anemia, iron deficiency 1.  Iron deficiency anemia: - Etiology is chronic GI loss from gastric and small bowel AVMs, patient was on aspirin and Plavix.  History of having received blood transfusions. -Last Feraheme infusion on 07/11/2018 and 07/18/2018. -She is continuing B12 injections monthly. -Plavix was discontinued in October 2019.  She is continuing aspirin 81 mg daily. -She denies any bleeding per rectum or melena.  She does complain of fatigue in the last 2 to 3 weeks. -We reviewed her blood work.  Ferritin has decreased to 23.  I have recommended 2 more infusions of Feraheme.  We will schedule it. -We will see her back in 4 months for follow-up with repeat blood work.  B12 was within normal limits today.  2.  Elevated Turan count and platelet count: -Her Nunnery count has been elevated since 2014, predominantly neutrophils.  She also has elevated platelet count. - BCR/ABL by FISH and Jak 2 V6 23F testing were negative ruling out myeloproliferative disorders.       Orders placed this encounter:  Orders Placed This Encounter  Procedures  . CBC with Differential/Platelet  . Comprehensive metabolic panel  . Ferritin  . Iron and TIBC  . Lactate dehydrogenase  . Vitamin B12  . Folate      Derek Jack, MD [place] [phone]

## 2018-10-11 NOTE — Telephone Encounter (Signed)
This has been addressed. Nothing further is needed at this time.  

## 2018-10-11 NOTE — Progress Notes (Signed)
Tammy Mcpherson presents today for injection per MD orders. B12 1,000 mcg administered SQ in right Upper Arm. Administration without incident. Patient tolerated well.

## 2018-10-15 ENCOUNTER — Other Ambulatory Visit (HOSPITAL_COMMUNITY): Payer: Self-pay | Admitting: Pharmacist

## 2018-10-16 ENCOUNTER — Ambulatory Visit: Payer: PPO | Admitting: Pulmonary Disease

## 2018-10-16 ENCOUNTER — Encounter: Payer: Self-pay | Admitting: Pulmonary Disease

## 2018-10-16 ENCOUNTER — Institutional Professional Consult (permissible substitution): Payer: BLUE CROSS/BLUE SHIELD | Admitting: Pulmonary Disease

## 2018-10-16 VITALS — BP 164/84 | HR 89 | Ht 65.0 in | Wt 161.0 lb

## 2018-10-16 DIAGNOSIS — Z716 Tobacco abuse counseling: Secondary | ICD-10-CM

## 2018-10-16 DIAGNOSIS — Z720 Tobacco use: Secondary | ICD-10-CM

## 2018-10-16 DIAGNOSIS — R0602 Shortness of breath: Secondary | ICD-10-CM | POA: Diagnosis not present

## 2018-10-16 DIAGNOSIS — J449 Chronic obstructive pulmonary disease, unspecified: Secondary | ICD-10-CM

## 2018-10-16 DIAGNOSIS — R0609 Other forms of dyspnea: Secondary | ICD-10-CM

## 2018-10-16 MED ORDER — ALBUTEROL SULFATE (2.5 MG/3ML) 0.083% IN NEBU: 2.5000 mg | INHALATION_SOLUTION | RESPIRATORY_TRACT | 5 refills | Status: DC | PRN

## 2018-10-16 MED ORDER — BUDESONIDE-FORMOTEROL FUMARATE 160-4.5 MCG/ACT IN AERO: 2.0000 | INHALATION_SPRAY | Freq: Two times a day (BID) | RESPIRATORY_TRACT | 0 refills | Status: DC

## 2018-10-16 MED ORDER — TIOTROPIUM BROMIDE MONOHYDRATE 2.5 MCG/ACT IN AERS: 2.0000 | INHALATION_SPRAY | Freq: Every day | RESPIRATORY_TRACT | 0 refills | Status: DC

## 2018-10-16 NOTE — Addendum Note (Signed)
Addended by: Karmen Stabs on: 10/16/2018 05:07 PM   Modules accepted: Orders

## 2018-10-16 NOTE — Progress Notes (Signed)
Synopsis: Referred in Jan 2020 for COPD evaluation by [name], PA*  Subjective:   [name] GENDER: female DOB: 12-Jan-1956, MRN: [id number]  Chief Complaint  Patient presents with  . Consult    Consult for COPD. States if she is active for about 30 minutes she gets SOB. She sleeps with 2 pillows. States she does have intermittent wheezing.     This is a 63 year old female with a past medical history of significant tobacco abuse.  She has smoked since she was a teenager, highest pack-year smoking at 2 packs/day throughout her 45s and 76s.  Currently smoking 1 pack/day.  She has been told many times that she needs to quit but she gets stressed and she feels like smoking helps decrease this anxiety and stress.  At this point she is very limited on her physical activity.  She is only able to walk short distances.  She no longer goes to the grocery store or goes to [employer].  She is unable to walk around the store and push a grocery cart.  She is also unable to climb stairs.  She avoids them at any cost because she is too dyspneic with any significant exertion.  She has not had any recent chest imaging.  She has not had full PFTs ever before.  She has had office spirometry at her primary care doctor's office and was told she had COPD.  She was started on Spiriva Respimat 1.25 and has not noticed much difference in her breathing.  She has had breathing treatments in the past which have made her shortness of breath much better.  She denies hemoptysis.  She does have daily cough.   Past Medical History:  Diagnosis Date  . Anemia 04/30/2016  . Anxiety   . Blood transfusion without reported diagnosis   . Cardiomyopathy ([place])    a. EF 40-45% by echo in 04/2016 b. Improved to 60-65% by repeat imaging in 2018  . CHF (congestive heart failure) ([place])   . COPD (chronic obstructive pulmonary disease) ([place])   . Coronary artery calcification seen on CT scan   . Essential  hypertension   . GERD (gastroesophageal reflux disease)   . History of bronchitis   . History of GI bleed   . Hyperlipidemia   . Iron deficiency anemia 05/12/2016   Bleeding ulcers  . Pollen allergy   . PSVT (paroxysmal supraventricular tachycardia) ([place])   . PVC's (premature ventricular contractions)      Family History  Adopted: Yes  Problem Relation Age of Onset  . Hypertension Father   . Coronary artery disease Sister   . Ulcers Maternal Grandmother   . Colon cancer Neg Hx        unknown, was adopted     Past Surgical History:  Procedure Laterality Date  . ABDOMINAL HYSTERECTOMY     polyps  about age 3  . Barbie Banner OSTEOTOMY Right 07/10/2013   Procedure: Barbie Banner OSTEOTOMY RIGHT FOOT;  Surgeon: Marcheta Grammes, DPM;  Location: [facility];  Service: Orthopedics;  Laterality: Right;  . AMPUTATION Left 05/09/2017   Procedure: AMPUTATION 5TH TOE LEFT FOOT;  Surgeon: [name], DPM;  Location: [facility];  Service: Podiatry;  Laterality: Left;  . AMPUTATION Left 06/13/2017   Procedure: AMPUTATION 2ND TOE LEFT FOOT;  Surgeon: [name], DPM;  Location: [facility];  Service: Podiatry;  Laterality: Left;  . BUNIONECTOMY Right 07/10/2013   Procedure: VOGLER BUNIONECTOMY RIGHT FOOT;  Surgeon: Marcheta Grammes,  DPM;  Location: [facility];  Service: Orthopedics;  Laterality: Right;  . CHOLECYSTECTOMY N/A 08/22/2017   Procedure: LAPAROSCOPIC CHOLECYSTECTOMY;  Surgeon: Virl Cagey, MD;  Location: [facility];  Service: General;  Laterality: N/A;  . COLONOSCOPY N/A 07/07/2016   Dr. Oneida Alar; redundant left colon, diverticulosis at hepatic flexure, non-bleeding internal hemorrhoids  . ESOPHAGOGASTRODUODENOSCOPY N/A 07/07/2016   Dr. Oneida Alar: many non-bleeding cratered gastric ulcers without stigmata of bleeding in gastric antrum. four 2-3 mm angioectasias without bleeding in duodenal bulb and second portion of duodenum s/p APC. Chroni gastritis on path.   . ESOPHAGOGASTRODUODENOSCOPY N/A  08/03/2017   Dr. Oneida Alar: erosive gastritis, AVMs. Found a single non-bleeding angioectasia in stomach, s/p APC therapy. Four non-bleeding angioectasias in duodenum s/p APC. Non-bleeding erosive gastropathy  . IR ANGIOGRAM EXTREMITY LEFT  04/24/2017  . IR FEM POP ART ATHERECT INC PTA MOD SED  04/24/2017  . IR INFUSION THROMBOL ARTERIAL INITIAL (MS)  04/24/2017  . IR RADIOLOGIST EVAL & MGMT  12/05/2016  . IR US GUIDE VASC ACCESS RIGHT  04/24/2017  . LIVER BIOPSY N/A 08/22/2017   Procedure: LIVER BIOPSY;  Surgeon: Virl Cagey, MD;  Location: [facility];  Service: General;  Laterality: N/A;  . METATARSAL HEAD EXCISION Right 07/10/2013   Procedure: METATARSAL HEAD RESECTION OF DIGITS 2 AND 3 RIGHT FOOT;  Surgeon: Marcheta Grammes, DPM;  Location: [facility];  Service: Orthopedics;  Laterality: Right;  . PROXIMAL INTERPHALANGEAL FUSION (PIP) Right 07/10/2013   Procedure: ARTHRODESIS PIPJ  2ND DIGIT RIGHT FOOT;  Surgeon: Marcheta Grammes, DPM;  Location: [facility];  Service: Orthopedics;  Laterality: Right;    Social History   Socioeconomic History  . Marital status: Married    Spouse name: [name]  . Number of children: 1  . Years of education: 9  . Highest education level: Not on file  Occupational History  . Occupation: disabled    Comment: heart  Social Needs  . Financial resource strain: Not on file  . Food insecurity:    Worry: Not on file    Inability: Not on file  . Transportation needs:    Medical: Not on file    Non-medical: Not on file  Tobacco Use  . Smoking status: Current Every Day Smoker    Packs/day: 0.50    Years: 44.00    Pack years: 22.00    Types: Cigarettes    Start date: 05/10/1975  . Smokeless tobacco: Never Used  . Tobacco comment: pack per day 1.15.20  Substance and Sexual Activity  . Alcohol use: Yes    Alcohol/week: 2.0 standard drinks    Types: 2 Glasses of wine per week  . Drug use: No  . Sexual activity: Yes    Birth control/protection: Surgical    Lifestyle  . Physical activity:    Days per week: Not on file    Minutes per session: Not on file  . Stress: Not on file  Relationships  . Social connections:    Talks on phone: Not on file    Gets together: Not on file    Attends religious service: Not on file    Active member of club or organization: Not on file    Attends meetings of clubs or organizations: Not on file    Relationship status: Not on file  . Intimate partner violence:    Fear of current or ex partner: Not on file    Emotionally abused: Not on file    Physically abused: Not on  file    Forced sexual activity: Not on file  Other Topics Concern  . Not on file  Social History Narrative   Disabled   Lives with husband [name]   Two dogs     Allergies  Allergen Reactions  . Bee Venom Swelling  . Codeine Anaphylaxis    Tongue swelled  . Penicillins Swelling and Rash    Has patient had a PCN reaction causing immediate rash, facial/tongue/throat swelling, SOB or lightheadedness with hypotension: No, delayed Has patient had a PCN reaction causing severe rash involving mucus membranes or skin necrosis: No Has patient had a PCN reaction that required hospitalization No Has patient had a PCN reaction occurring within the last 10 years: No If all of the above answers are "NO", then may proceed with Cephalosporin use.   . Bupropion Other (See Comments)    "Made my skin crawl"  . Sudafed [Pseudoephedrine Hcl] Rash     Outpatient Medications Prior to Visit  Medication Sig Dispense Refill  . acetaminophen (TYLENOL) 500 MG tablet Take 500 mg by mouth at bedtime as needed for moderate pain.    Marland Kitchen albuterol (PROAIR HFA) 108 (90 Base) MCG/ACT inhaler INHALE 2 PUFFS INTO THE LUNGS EVERY 6 HOURS AS NEEDED FOR WHEEZING OR SHORTNESS OF BREATH. 18 g 3  . aspirin 81 MG chewable tablet Chew 81 mg by mouth daily.    Marland Kitchen atorvastatin (LIPITOR) 20 MG tablet Take 1 tablet (20 mg total) by mouth daily. 90 tablet 3  . bisoprolol (ZEBETA)  10 MG tablet Take 1 tablet (10 mg total) by mouth daily. 90 tablet 3  . losartan (COZAAR) 100 MG tablet TAKE ONE (1) TABLET BY MOUTH EVERY DAY 90 tablet 1  . Multiple Vitamin (MULTIVITAMIN WITH MINERALS) TABS tablet Take 1 tablet by mouth daily.    . pantoprazole (PROTONIX) 40 MG tablet TAKE ONE (1) TABLET BY MOUTH EVERY DAY 90 tablet 3  . SPIRIVA RESPIMAT 1.25 MCG/ACT AERS INHALE TWO PUFFS INTO THE LUNGS DAILY 4 g 3  . cholecalciferol (VITAMIN D) 1000 units tablet Take 1,000 Units by mouth daily.     . Tiotropium Bromide Monohydrate (SPIRIVA RESPIMAT IN) Inhale into the lungs.     No facility-administered medications prior to visit.     Review of Systems  Constitutional: Negative for chills, fever, malaise/fatigue and weight loss.  HENT: Negative for hearing loss, sore throat and tinnitus.   Eyes: Negative for blurred vision and double vision.  Respiratory: Positive for cough, sputum production, shortness of breath and wheezing. Negative for hemoptysis and stridor.   Cardiovascular: Negative for chest pain, palpitations, orthopnea, leg swelling and PND.  Gastrointestinal: Negative for abdominal pain, constipation, diarrhea, heartburn, nausea and vomiting.  Genitourinary: Negative for dysuria, hematuria and urgency.  Musculoskeletal: Negative for joint pain and myalgias.  Skin: Negative for itching and rash.  Neurological: Negative for dizziness, tingling, weakness and headaches.  Endo/Heme/Allergies: Negative for environmental allergies. Does not bruise/bleed easily.  Psychiatric/Behavioral: Negative for depression. The patient is not nervous/anxious and does not have insomnia.   All other systems reviewed and are negative.    Objective:  Physical Exam Vitals signs reviewed.  Constitutional:      General: She is not in acute distress.    Appearance: She is well-developed.  HENT:     Head: Normocephalic and atraumatic.     Mouth/Throat:     Pharynx: No oropharyngeal exudate.    Eyes:     Conjunctiva/sclera: Conjunctivae normal.  Pupils: Pupils are equal, round, and reactive to light.  Neck:     Vascular: No JVD.     Trachea: No tracheal deviation.     Comments: Loss of supraclavicular fat Cardiovascular:     Rate and Rhythm: Normal rate and regular rhythm.     Heart sounds: S1 normal and S2 normal.     Comments: Distant heart tones Pulmonary:     Effort: No tachypnea or accessory muscle usage.     Breath sounds: No stridor. Decreased breath sounds (throughout all lung fields) present. No wheezing, rhonchi or rales.  Abdominal:     General: Bowel sounds are normal. There is no distension.     Palpations: Abdomen is soft.     Tenderness: There is no abdominal tenderness.  Musculoskeletal:        General: Deformity (muscle wasting ) present.  Skin:    General: Skin is warm and dry.     Capillary Refill: Capillary refill takes less than 2 seconds.     Findings: No rash.  Neurological:     Mental Status: She is alert and oriented to person, place, and time.  Psychiatric:        Behavior: Behavior normal.      Vitals:   10/16/18 1420  BP: (!) 164/84  Pulse: 89  SpO2: 97%  Weight: 161 lb (73 kg)  Height: 5\' 5"  (1.651 m)   97% on RA BMI Readings from Last 3 Encounters:  10/16/18 26.79 kg/m  10/11/18 26.92 kg/m  10/10/18 26.79 kg/m   Wt Readings from Last 3 Encounters:  10/16/18 161 lb (73 kg)  10/11/18 161 lb 12.8 oz (73.4 kg)  10/10/18 161 lb (73 kg)     CBC    Component Value Date/Time   WBC 15.1 (H) 10/08/2018 1110   RBC 4.51 10/08/2018 1110   HGB 13.3 10/08/2018 1110   HCT 42.3 10/08/2018 1110   PLT 421 (H) 10/08/2018 1110   MCV 93.8 10/08/2018 1110   MCH 29.5 10/08/2018 1110   MCHC 31.4 10/08/2018 1110   RDW 14.9 10/08/2018 1110   LYMPHSABS 1.8 10/08/2018 1110   MONOABS 1.4 (H) 10/08/2018 1110   EOSABS 0.2 10/08/2018 1110   BASOSABS 0.1 10/08/2018 1110    Chest Imaging: CT chest 2017: No evidence of pulmonary  embolism, cardiomegaly, mild pulmonary edema No pulmonary nodules. The patient's images have been independently reviewed by me.    Pulmonary Functions Testing Results: No flowsheet data found.  FeNO: None   Pathology: None   Echocardiogram:  Preserved EF - 65-70%   Heart Catheterization: None     Assessment & Plan:   Chronic obstructive pulmonary disease, unspecified COPD type ([place]) - Plan: Pulmonary function test, Ambulatory Referral for DME  DOE (dyspnea on exertion)  SOB (shortness of breath)  Tobacco abuse  Tobacco abuse counseling  Discussion: This is a 63 year old female that likely has severe stage COPD based on symptomatology.  She has significant dyspnea on exertion.  She also has physical limitation to her activities of daily living to include inability to climb stairs or walk any distance.  At this point she is modifying behavior to become less mobile no longer grocery shopping for herself.  Unfortunately she is still smoking.  I spent greater than 15 minutes of the office visit discussing techniques to quit smoking and the importance of what this entails to her pulmonary health.  She is currently maintained on Spiriva Respimat 1.25. I think we should increase this to  Spiriva Respimat 2.5 We shall also add Symbicort 160, 2 puffs twice daily We will also get her an albuterol nebulizer and nebulized solution We will also get refills of her albuterol HFA inhaler for shortness of breath and wheezing. She should continue her over-the-counter vitamin D supplementation  She was offered referral to our lung cancer screening program however she has declined.  We will obtain full pulmonary function tests and she can return to have these reviewed with one of our nurse practitioners in 2 weeks.   Current Outpatient Medications:  .  acetaminophen (TYLENOL) 500 MG tablet, Take 500 mg by mouth at bedtime as needed for moderate pain., Disp: , Rfl:  .  albuterol (PROAIR HFA)  108 (90 Base) MCG/ACT inhaler, INHALE 2 PUFFS INTO THE LUNGS EVERY 6 HOURS AS NEEDED FOR WHEEZING OR SHORTNESS OF BREATH., Disp: 18 g, Rfl: 3 .  aspirin 81 MG chewable tablet, Chew 81 mg by mouth daily., Disp: , Rfl:  .  atorvastatin (LIPITOR) 20 MG tablet, Take 1 tablet (20 mg total) by mouth daily., Disp: 90 tablet, Rfl: 3 .  bisoprolol (ZEBETA) 10 MG tablet, Take 1 tablet (10 mg total) by mouth daily., Disp: 90 tablet, Rfl: 3 .  losartan (COZAAR) 100 MG tablet, TAKE ONE (1) TABLET BY MOUTH EVERY DAY, Disp: 90 tablet, Rfl: 1 .  Multiple Vitamin (MULTIVITAMIN WITH MINERALS) TABS tablet, Take 1 tablet by mouth daily., Disp: , Rfl:  .  pantoprazole (PROTONIX) 40 MG tablet, TAKE ONE (1) TABLET BY MOUTH EVERY DAY, Disp: 90 tablet, Rfl: 3 .  SPIRIVA RESPIMAT 1.25 MCG/ACT AERS, INHALE TWO PUFFS INTO THE LUNGS DAILY, Disp: 4 g, Rfl: 3 .  cholecalciferol (VITAMIN D) 1000 units tablet, Take 1,000 Units by mouth daily. , Disp: , Rfl:    Garner Nash, DO [facility] 10/16/2018 2:28 PM

## 2018-10-16 NOTE — Patient Instructions (Addendum)
Thank you for visiting Dr. [name] at [facility]. Today we recommend the following: Orders Placed This Encounter  Procedures  . Ambulatory Referral for DME  . Pulmonary function test   Meds ordered this encounter  Medications  . budesonide-formoterol (SYMBICORT) 160-4.5 MCG/ACT inhaler    Sig: Inhale 2 puffs into the lungs every 12 (twelve) hours.    Dispense:  1 Inhaler    Refill:  0    Order Specific Question:   Lot Number?    Answer:   [id number]    Order Specific Question:   Expiration Date?    Answer:   10/03/2019    Order Specific Question:   Manufacturer?    Answer:   [employer] [[id number]]    Order Specific Question:   Quantity    Answer:   1  . albuterol (PROVENTIL) (2.5 MG/3ML) 0.083% nebulizer solution    Sig: Take 3 mLs (2.5 mg total) by nebulization every 4 (four) hours as needed for wheezing or shortness of breath.    Dispense:  75 mL    Refill:  5  . Tiotropium Bromide Monohydrate (SPIRIVA RESPIMAT) 2.5 MCG/ACT AERS    Sig: Inhale 2 puffs into the lungs daily.    Dispense:  1 Inhaler    Refill:  0    Order Specific Question:   Lot Number?    Answer:   [id number]    Order Specific Question:   Expiration Date?    Answer:   11/16/2020    Order Specific Question:   Quantity    Answer:   1   Would start and over the counter vitamin D supplement 2000 IU daily.   Return in about 2 weeks (around 10/30/2018). Please schedule follow up with [name], NP to review PFTs and Inhaler regimen.

## 2018-10-17 ENCOUNTER — Ambulatory Visit (HOSPITAL_COMMUNITY): Payer: PPO

## 2018-10-17 ENCOUNTER — Inpatient Hospital Stay (HOSPITAL_COMMUNITY): Payer: PPO

## 2018-10-17 ENCOUNTER — Other Ambulatory Visit: Payer: Self-pay

## 2018-10-17 ENCOUNTER — Encounter (HOSPITAL_COMMUNITY): Payer: Self-pay

## 2018-10-17 VITALS — BP 153/62 | HR 70 | Temp 97.9°F | Resp 18

## 2018-10-17 DIAGNOSIS — D509 Iron deficiency anemia, unspecified: Secondary | ICD-10-CM | POA: Diagnosis not present

## 2018-10-17 DIAGNOSIS — D508 Other iron deficiency anemias: Secondary | ICD-10-CM

## 2018-10-17 MED ORDER — SODIUM CHLORIDE 0.9 % IV SOLN
510.0000 mg | Freq: Once | INTRAVENOUS | Status: AC
  Administered 2018-10-17: 510 mg via INTRAVENOUS
  Filled 2018-10-17: qty 17

## 2018-10-17 MED ORDER — SODIUM CHLORIDE 0.9 % IV SOLN
INTRAVENOUS | Status: DC
  Administered 2018-10-17: 09:00:00 via INTRAVENOUS

## 2018-10-17 NOTE — Progress Notes (Signed)
Pt. Here today for iron infusion. No new issues at this time.  Treatment given per orders. Patient tolerated it well without problems. Vitals stable and discharged home from clinic ambulatory. Follow up as scheduled.

## 2018-10-17 NOTE — Patient Instructions (Signed)
[facility] at [facility]  Discharge Instructions:   _______________________________________________________________  Thank you for choosing [facility] at [facility] to provide your oncology and hematology care.  To afford each patient quality time with our providers, please arrive at least 15 minutes before your scheduled appointment.  You need to re-schedule your appointment if you arrive 10 or more minutes late.  We strive to give you quality time with our providers, and arriving late affects you and other patients whose appointments are after yours.  Also, if you no show three or more times for appointments you may be dismissed from the clinic.  Again, thank you for choosing [facility] at [facility]. Our hope is that these requests will allow you access to exceptional care and in a timely manner. _______________________________________________________________  If you have questions after your visit, please contact our office at [phone] between the hours of 8:30 a.m. and 5:00 p.m. Voicemails left after 4:30 p.m. will not be returned until the following business day. _______________________________________________________________  For prescription refill requests, have your pharmacy contact our office. _______________________________________________________________  Recommendations made by the consultant and any test results will be sent to your referring physician. _______________________________________________________________ 

## 2018-10-21 ENCOUNTER — Telehealth: Payer: Self-pay | Admitting: Pulmonary Disease

## 2018-10-21 NOTE — Telephone Encounter (Signed)
Spoke with pt  She is asking about her neb machine that we ordered  Not contacted by any DME yet  We already sent the order to [facility] and so I will forward to [facility] per protocol to check on this

## 2018-10-22 DIAGNOSIS — J449 Chronic obstructive pulmonary disease, unspecified: Secondary | ICD-10-CM | POA: Diagnosis not present

## 2018-10-22 NOTE — Telephone Encounter (Signed)
I spoke to [place] w/ [facility] who was not able to locate the order that was faxed to them on 1/15 & that we rec'd a fax confirm.  Tan states that respiratory does not get in until 10 today & will have them call me.  Has asked me to fax the order them again in the meantime.  Thinks it may be possible that they are waiting on an authorization from patient's insurance.  I will contact the patient once I speak with the respiratory representative from [facility].  LM on pt's VM to give her an update.

## 2018-10-22 NOTE — Telephone Encounter (Signed)
Waiting on documentation from [facility].

## 2018-10-22 NOTE — Telephone Encounter (Signed)
Spoke to pt who verbalized she has obtained the nebulizer from [facility] & nothing further needed at this time.

## 2018-10-22 NOTE — Telephone Encounter (Signed)
I spoke to [facility] w/ Respiratory for [employer].  States they were waiting on an auth from pt's ins.  States they were able to get the pt taken care of today.  Nothing further needed at this time.

## 2018-10-24 ENCOUNTER — Encounter (HOSPITAL_COMMUNITY): Payer: Self-pay

## 2018-10-24 ENCOUNTER — Inpatient Hospital Stay (HOSPITAL_COMMUNITY): Payer: PPO

## 2018-10-24 VITALS — BP 170/60 | HR 90 | Temp 98.1°F | Resp 18

## 2018-10-24 DIAGNOSIS — D509 Iron deficiency anemia, unspecified: Secondary | ICD-10-CM | POA: Diagnosis not present

## 2018-10-24 DIAGNOSIS — D508 Other iron deficiency anemias: Secondary | ICD-10-CM

## 2018-10-24 MED ORDER — SODIUM CHLORIDE 0.9% FLUSH
10.0000 mL | INTRAVENOUS | Status: DC | PRN
  Administered 2018-10-24: 10 mL
  Filled 2018-10-24: qty 10

## 2018-10-24 MED ORDER — SODIUM CHLORIDE 0.9 % IV SOLN
510.0000 mg | Freq: Once | INTRAVENOUS | Status: AC
  Administered 2018-10-24: 510 mg via INTRAVENOUS
  Filled 2018-10-24: qty 510

## 2018-10-24 MED ORDER — SODIUM CHLORIDE 0.9 % IV SOLN
INTRAVENOUS | Status: DC
  Administered 2018-10-24: 14:00:00 via INTRAVENOUS

## 2018-10-24 NOTE — Patient Instructions (Signed)
[facility] at [facility]  Discharge Instructions:   _______________________________________________________________  Thank you for choosing [facility] at [facility] to provide your oncology and hematology care.  To afford each patient quality time with our providers, please arrive at least 15 minutes before your scheduled appointment.  You need to re-schedule your appointment if you arrive 10 or more minutes late.  We strive to give you quality time with our providers, and arriving late affects you and other patients whose appointments are after yours.  Also, if you no show three or more times for appointments you may be dismissed from the clinic.  Again, thank you for choosing [facility]. Our hope is that these requests will allow you access to exceptional care and in a timely manner. _______________________________________________________________  If you have questions after your visit, please contact our office at [phone] between the hours of 8:30 a.m. and 5:00 p.m. Voicemails left after 4:30 p.m. will not be returned until the following business day. _______________________________________________________________  For prescription refill requests, have your pharmacy contact our office. _______________________________________________________________  Recommendations made by the consultant and any test results will be sent to your referring physician. _______________________________________________________________ 

## 2018-10-24 NOTE — Progress Notes (Signed)
Patient tolerated iron infusion with no complaints voiced.  Peripheral IV site clean and dry with good blood return noted before and after treatment.  Band aid applied. No bruising or swelling noted at site.  VSS with discharge and left ambulatory with no s/s of distress noted and left with family.

## 2018-10-29 ENCOUNTER — Ambulatory Visit: Payer: PPO | Admitting: Pulmonary Disease

## 2018-11-01 ENCOUNTER — Ambulatory Visit (INDEPENDENT_AMBULATORY_CARE_PROVIDER_SITE_OTHER): Payer: PPO | Admitting: Pulmonary Disease

## 2018-11-01 ENCOUNTER — Encounter: Payer: Self-pay | Admitting: Pulmonary Disease

## 2018-11-01 VITALS — BP 148/80 | HR 97 | Ht 63.0 in | Wt 163.0 lb

## 2018-11-01 DIAGNOSIS — F17210 Nicotine dependence, cigarettes, uncomplicated: Secondary | ICD-10-CM

## 2018-11-01 DIAGNOSIS — J449 Chronic obstructive pulmonary disease, unspecified: Secondary | ICD-10-CM | POA: Diagnosis not present

## 2018-11-01 DIAGNOSIS — Z23 Encounter for immunization: Secondary | ICD-10-CM | POA: Diagnosis not present

## 2018-11-01 DIAGNOSIS — Z720 Tobacco use: Secondary | ICD-10-CM

## 2018-11-01 LAB — PULMONARY FUNCTION TEST
DL/VA % pred: 65 %
DL/VA: 3.05 ml/min/mmHg/L
DLCO unc % pred: 51 %
DLCO unc: 11.79 ml/min/mmHg
FEF 25-75 Post: 0.5 L/sec
FEF 25-75 Pre: 0.76 L/sec
FEF2575-%Change-Post: -33 %
FEF2575-%Pred-Post: 22 %
FEF2575-%Pred-Pre: 33 %
FEV1-%Change-Post: -2 %
FEV1-%Pred-Post: 62 %
FEV1-%Pred-Pre: 64 %
FEV1-POST: 1.52 L
FEV1-Pre: 1.57 L
FEV1FVC-%Change-Post: 13 %
FEV1FVC-%Pred-Pre: 83 %
FEV6-%Change-Post: -11 %
FEV6-%PRED-POST: 68 %
FEV6-%Pred-Pre: 77 %
FEV6-Post: 2.07 L
FEV6-Pre: 2.35 L
FEV6FVC-%Change-Post: 2 %
FEV6FVC-%Pred-Post: 103 %
FEV6FVC-%Pred-Pre: 101 %
FVC-%Change-Post: -14 %
FVC-%PRED-POST: 65 %
FVC-%Pred-Pre: 76 %
FVC-Post: 2.07 L
PRE FEV6/FVC RATIO: 98 %
Post FEV1/FVC ratio: 74 %
Post FEV6/FVC ratio: 100 %
Pre FEV1/FVC ratio: 65 %
RV % pred: 90 %
RV: 1.77 L
TLC % pred: 84 %
TLC: 4.12 L

## 2018-11-01 MED ORDER — VARENICLINE TARTRATE 0.5 MG X 11 & 1 MG X 42 PO MISC: ORAL | 0 refills | Status: DC

## 2018-11-01 NOTE — Assessment & Plan Note (Addendum)
Assessment: 33-pack-year smoking history Current every day smoker Willing to try to quit Has not set quit date  Plan: Chantix prescription today >>>Patient to contact our office with any concerns regarding Chantix >>> Counseled to monitor for changes in mental health status, mood swings, vivid dreams >>> Patient and spouse discussed the patient will start Chantix when spouse is on a break from work and has 4 days off to monitor for symptoms If patient fails Chantix then will prescribe 21 mg nicotine patches as well as 4 mg nicotine lozenges/or gum Referred patient to lung cancer screening program Pneumovax 23 today  Follow-up with our office in 4 weeks

## 2018-11-01 NOTE — Assessment & Plan Note (Addendum)
Assessment: Concavity and flow volume loops on pulmonary function test Ratio 74 and FEV1 of 1.52, with patient using Spiriva 2.5 this morning Maintained well on Symbicort 160 and Spiriva 2.5 Saba use 4 times daily DLCO 51 Lungs diminished but clear  Plan: Continue Symbicort 160 Continue Spiriva 2.5 Walk in office today due to decreased DLCO Pneumovax 23 today Referral to lung cancer screening program Emphasized the importance of stopping smoking Chantix prescribed today Follow-up with our office in 4 weeks

## 2018-11-01 NOTE — Patient Instructions (Addendum)
Walk today in office on room air  Continue Symbicort 160 >>> 2 puffs in the morning right when you wake up, rinse out your mouth after use, 12 hours later 2 puffs, rinse after use >>> Take this daily, no matter what >>> This is not a rescue inhaler   Spiriva Respimat 2.5 >>> 2 puffs daily >>> Do this every day >>>This is not a rescue inhaler  Albuterol every 4-6 hours as needed for shortness of breath or wheezing    Note your daily symptoms > remember "red flags" for COPD:   >>>Increase in cough >>>increase in sputum production >>>increase in shortness of breath or activity  intolerance.   If you notice these symptoms, please call the office to be seen.    We will refer you today to her lung cancer screening program >>>This is based off of your 33 pack-year smoking history >>> This is a recommendation from the [place] preventative services task force (USPSTF) >>>The USPSTF recommends annual screening for lung cancer with low-dose computed tomography (LDCT) in adults aged 43 to 80 years who have a 30 pack-year smoking history and currently smoke or have quit within the past 15 years. Screening should be discontinued once a person has not smoked for 15 years or develops a health problem that substantially limits life expectancy or the ability or willingness to have curative lung surgery.   Our office will call you and set up an appointment with Eric Form (Nurse Practitioner) who leads this program.  This appointment takes place in our office.  After completing this meeting with Eric Form NP you will get a low-dose CT as the screening >>>We will call you with those results     I have prescribed Chantix >>>Follow the instructions on the prescription >>> It is a printed prescription >>>Review the information listed below >>>If you have mood swings or worsen dreams please contact our office and stop this medication    Pneumovax23 today    We recommend that you stop smoking.    >>>You need to set a quit date >>>If you have friends or family who smoke, let them know you are trying to quit and not to smoke around you or in your living environment  Smoking Cessation Resources:  1 800 QUIT NOW  >>> Patient to call this resource and utilize it to help support her quit smoking >>> Keep up your hard work with stopping smoking  You can also contact the [facility] >>>For smoking cessation classes call [phone]  We do not recommend using e-cigarettes as a form of stopping smoking  You can sign up for smoking cessation support texts and information:  >>>https://smokefree.gov/smokefreetxt   Follow-up with Dr. [name] or [name] NP in  4 weeks to see how you are doing as well as smoking cessation   It is flu season:   >>>Remember to be washing your hands regularly, using hand sanitizer, be careful to use around herself with has contact with people who are sick will increase her chances of getting sick yourself. >>> Best ways to protect herself from the flu: Receive the yearly flu vaccine, practice good hand hygiene washing with soap and also using hand sanitizer when available, eat a nutritious meals, get adequate rest, hydrate appropriately   Please contact the office if your symptoms worsen or you have concerns that you are not improving.   Thank you for choosing [facility] for your healthcare, and for allowing [place] to partner with you  on your healthcare journey. I am thankful to be able to provide care to you today.   [name] FNP-C       Pneumococcal Vaccine, Polyvalent solution for injection What is this medicine? PNEUMOCOCCAL VACCINE, POLYVALENT (NEU mo KOK al vak SEEN, pol ee VEY luhnt) is a vaccine to prevent pneumococcus bacteria infection. These bacteria are a major cause of ear infections, Strep throat infections, and serious pneumonia, meningitis, or blood infections worldwide. These vaccines help the body to produce  antibodies (protective substances) that help your body defend against these bacteria. This vaccine is recommended for people 85 years of age and older with health problems. It is also recommended for all adults over 25 years old. This vaccine will not treat an infection. This medicine may be used for other purposes; ask your health care provider or pharmacist if you have questions. COMMON BRAND NAME(S): Pneumovax 23 What should I tell my health care provider before I take this medicine? They need to know if you have any of these conditions: -bleeding problems -bone marrow or organ transplant -cancer, Hodgkin's disease -fever -infection -immune system problems -low platelet count in the blood -seizures -an unusual or allergic reaction to pneumococcal vaccine, diphtheria toxoid, other vaccines, latex, other medicines, foods, dyes, or preservatives -pregnant or trying to get pregnant -breast-feeding How should I use this medicine? This vaccine is for injection into a muscle or under the skin. It is given by a health care professional. A copy of Vaccine Information Statements will be given before each vaccination. Read this sheet carefully each time. The sheet may change frequently. Talk to your pediatrician regarding the use of this medicine in children. While this drug may be prescribed for children as young as 35 years of age for selected conditions, precautions do apply. Overdosage: If you think you have taken too much of this medicine contact a poison control center or emergency room at once. NOTE: This medicine is only for you. Do not share this medicine with others. What if I miss a dose? It is important not to miss your dose. Call your doctor or health care professional if you are unable to keep an appointment. What may interact with this medicine? -medicines for cancer chemotherapy -medicines that suppress your immune function -medicines that treat or prevent blood clots like warfarin,  enoxaparin, and dalteparin -steroid medicines like prednisone or cortisone This list may not describe all possible interactions. Give your health care provider a list of all the medicines, herbs, non-prescription drugs, or dietary supplements you use. Also tell them if you smoke, drink alcohol, or use illegal drugs. Some items may interact with your medicine. What should I watch for while using this medicine? Mild fever and pain should go away in 3 days or less. Report any unusual symptoms to your doctor or health care professional. What side effects may I notice from receiving this medicine? Side effects that you should report to your doctor or health care professional as soon as possible: -allergic reactions like skin rash, itching or hives, swelling of the face, lips, or tongue -breathing problems -confused -fever over 102 degrees F -pain, tingling, numbness in the hands or feet -seizures -unusual bleeding or bruising -unusual muscle weakness Side effects that usually do not require medical attention (report to your doctor or health care professional if they continue or are bothersome): -aches and pains -diarrhea -fever of 102 degrees F or less -headache -irritable -loss of appetite -pain, tender at site where injected -trouble sleeping This  list may not describe all possible side effects. Call your doctor for medical advice about side effects. You may report side effects to FDA at 1-800-FDA-1088. Where should I keep my medicine? This does not apply. This vaccine is given in a clinic, pharmacy, doctor's office, or other health care setting and will not be stored at home. NOTE: This sheet is a summary. It may not cover all possible information. If you have questions about this medicine, talk to your doctor, pharmacist, or health care provider.  2019 Elsevier/Gold Standard (2008-04-24 14:32:37)     Varenicline oral tablets What is this medicine? VARENICLINE (var EN i kleen) is  used to help people quit smoking. It is used with a patient support program recommended by your physician. This medicine may be used for other purposes; ask your health care provider or pharmacist if you have questions. COMMON BRAND NAME(S): Chantix What should I tell my health care provider before I take this medicine? They need to know if you have any of these conditions: -heart disease -if you often drink alcohol -kidney disease -mental illness -on hemodialysis -seizures -history of stroke -suicidal thoughts, plans, or attempt; a previous suicide attempt by you or a family member -an unusual or allergic reaction to varenicline, other medicines, foods, dyes, or preservatives -pregnant or trying to get pregnant -breast-feeding How should I use this medicine? Take this medicine by mouth after eating. Take with a full glass of water. Follow the directions on the prescription label. Take your doses at regular intervals. Do not take your medicine more often than directed. There are 3 ways you can use this medicine to help you quit smoking; talk to your health care professional to decide which plan is right for you: 1) you can choose a quit date and start this medicine 1 week before the quit date, or, 2) you can start taking this medicine before you choose a quit date, and then pick a quit date between day 8 and 35 days of treatment, or, 3) if you are not sure that you are able or willing to quit smoking right away, start taking this medicine and slowly decrease the amount you smoke as directed by your health care professional with the goal of being cigarette-free by week 12 of treatment. Stick to your plan; ask about support groups or other ways to help you remain cigarette-free. If you are motivated to quit smoking and did not succeed during a previous attempt with this medicine for reasons other than side effects, or if you returned to smoking after this treatment, speak with your health care  professional about whether another course of this medicine may be right for you. A special MedGuide will be given to you by the pharmacist with each prescription and refill. Be sure to read this information carefully each time. Talk to your pediatrician regarding the use of this medicine in children. This medicine is not approved for use in children. Overdosage: If you think you have taken too much of this medicine contact a poison control center or emergency room at once. NOTE: This medicine is only for you. Do not share this medicine with others. What if I miss a dose? If you miss a dose, take it as soon as you can. If it is almost time for your next dose, take only that dose. Do not take double or extra doses. What may interact with this medicine? -alcohol -insulin -other medicines used to help people quit smoking -theophylline -warfarin This list may not  describe all possible interactions. Give your health care provider a list of all the medicines, herbs, non-prescription drugs, or dietary supplements you use. Also tell them if you smoke, drink alcohol, or use illegal drugs. Some items may interact with your medicine. What should I watch for while using this medicine? It is okay if you do not succeed at your attempt to quit and have a cigarette. You can still continue your quit attempt and keep using this medicine as directed. Just throw away your cigarettes and get back to your quit plan. Talk to your health care provider before using other treatments to quit smoking. Using this medicine with other treatments to quit smoking may increase the risk for side effects compared to using a treatment alone. You may get drowsy or dizzy. Do not drive, use machinery, or do anything that needs mental alertness until you know how this medicine affects you. Do not stand or sit up quickly, especially if you are an older patient. This reduces the risk of dizzy or fainting spells. Decrease the number of  alcoholic beverages that you drink during treatment with this medicine until you know if this medicine affects your ability to tolerate alcohol. Some people have experienced increased drunkenness (intoxication), unusual or sometimes aggressive behavior, or no memory of things that have happened (amnesia) during treatment with this medicine. Sleepwalking can happen during treatment with this medicine, and can sometimes lead to behavior that is harmful to you, other people, or property. Stop taking this medicine and tell your doctor if you start sleepwalking or have other unusual sleep-related activity. After taking this medicine, you may get up out of bed and do an activity that you do not know you are doing. The next morning, you may have no memory of this. Activities include driving a car ("sleep-driving"), making and eating food, talking on the phone, sexual activity, and sleep-walking. Serious injuries have occurred. Stop the medicine and call your doctor right away if you find out you have done any of these activities. Do not take this medicine if you have used alcohol that evening. Do not take it if you have taken another medicine for sleep. The risk of doing these sleep-related activities is higher. Patients and their families should watch out for new or worsening depression or thoughts of suicide. Also watch out for sudden changes in feelings such as feeling anxious, agitated, panicky, irritable, hostile, aggressive, impulsive, severely restless, overly excited and hyperactive, or not being able to sleep. If this happens, call your health care professional. If you have diabetes and you quit smoking, the effects of insulin may be increased and you may need to reduce your insulin dose. Check with your doctor or health care professional about how you should adjust your insulin dose. What side effects may I notice from receiving this medicine? Side effects that you should report to your doctor or health  care professional as soon as possible: -allergic reactions like skin rash, itching or hives, swelling of the face, lips, tongue, or throat -acting aggressive, being angry or violent, or acting on dangerous impulses -breathing problems -changes in emotions or moods -chest pain or chest tightness -feeling faint or lightheaded, falls -hallucination, loss of contact with reality -mouth sores -redness, blistering, peeling or loosening of the skin, including inside the mouth -signs and symptoms of a stroke like changes in vision; confusion; trouble speaking or understanding; severe headaches; sudden numbness or weakness of the face, arm or leg; trouble walking; dizziness; loss of balance or  coordination -seizures -sleepwalking -suicidal thoughts or other mood changes Side effects that usually do not require medical attention (report to your doctor or health care professional if they continue or are bothersome): -constipation -gas -headache -nausea, vomiting -strange dreams -trouble sleeping This list may not describe all possible side effects. Call your doctor for medical advice about side effects. You may report side effects to FDA at 1-800-FDA-1088. Where should I keep my medicine? Keep out of the reach of children. Store at room temperature between 15 and 30 degrees C (59 and 86 degrees F). Throw away any unused medicine after the expiration date. NOTE: This sheet is a summary. It may not cover all possible information. If you have questions about this medicine, talk to your doctor, pharmacist, or health care provider.  2019 Elsevier/Gold Standard (2018-03-15 12:48:08)    Health Risks of Smoking Smoking cigarettes is very bad for your health. Tobacco smoke has over 200 known poisons in it. It contains the poisonous gases nitrogen oxide and carbon monoxide. There are over 60 chemicals in tobacco smoke that cause cancer. Smoking is difficult to quit because a chemical in tobacco, called  nicotine, causes addiction or dependence. When you smoke and inhale, nicotine is absorbed rapidly into the bloodstream through your lungs. Both inhaled and non-inhaled nicotine may be addictive. What are the risks of cigarette smoke? Cigarette smokers have an increased risk of many serious medical problems, including:  Lung cancer.  Lung disease, such as pneumonia, bronchitis, and emphysema.  Chest pain (angina) and heart attack because the heart is not getting enough oxygen.  Heart disease and peripheral blood vessel disease.  High blood pressure (hypertension).  Stroke.  Oral cancer, including cancer of the lip, mouth, or voice box.  Bladder cancer.  Pancreatic cancer.  Cervical cancer.  Pregnancy complications, including premature birth.  Stillbirths and smaller newborn babies, birth defects, and genetic damage to sperm.  Early menopause.  Lower estrogen level for women.  Infertility.  Facial wrinkles.  Blindness.  Increased risk of broken bones (fractures).  Senile dementia.  Stomach ulcers and internal bleeding.  Delayed wound healing and increased risk of complications during surgery.  Even smoking lightly shortens your life expectancy by several years. Because of secondhand smoke exposure, children of smokers have an increased risk of the following:  Sudden infant death syndrome (SIDS).  Respiratory infections.  Lung cancer.  Heart disease.  Ear infections. What are the benefits of quitting? There are many health benefits of quitting smoking. Here are some of them:  Within days of quitting smoking, your risk of having a heart attack decreases, your blood flow improves, and your lung capacity improves. Blood pressure, pulse rate, and breathing patterns start returning to normal soon after quitting.  Within months, your lungs may clear up completely.  Quitting for 10 years reduces your risk of developing lung cancer and heart disease to almost  that of a nonsmoker.  People who quit may see an improvement in their overall quality of life. How do I quit smoking?     Smoking is an addiction with both physical and psychological effects, and longtime habits can be hard to change. Your health care provider can recommend:  Programs and community resources, which may include group support, education, or talk therapy.  Prescription medicines to help reduce cravings.  Nicotine replacement products, such as patches, gum, and nasal sprays. Use these products only as directed. Do not replace cigarette smoking with electronic cigarettes, which are commonly called e-cigarettes. The safety  of e-cigarettes is not known, and some may contain harmful chemicals.  A combination of two or more of these methods. Where to find more information  American Lung Association: www.lung.org  American Cancer Society: www.cancer.org Summary  Smoking cigarettes is very bad for your health. Cigarette smokers have an increased risk of many serious medical problems, including several cancers, heart disease, and stroke.  Smoking is an addiction with both physical and psychological effects, and longtime habits can be hard to change.  By stopping right away, you can greatly reduce the risk of medical problems for you and your family.  To help you quit smoking, your health care provider can recommend programs, community resources, prescription medicines, and nicotine replacement products such as patches, gum, and nasal sprays. This information is not intended to replace advice given to you by your health care provider. Make sure you discuss any questions you have with your health care provider. Document Released: 10/26/2004 Document Revised: 12/20/2017 Document Reviewed: 09/22/2016 Elsevier Interactive Patient Education  2019 [employer].   Steps to Quit Smoking  Smoking tobacco can be bad for your health. It can also affect almost every organ in your body.  Smoking puts you and people around you at risk for many serious long-lasting (chronic) diseases. Quitting smoking is hard, but it is one of the best things that you can do for your health. It is never too late to quit. What are the benefits of quitting smoking? When you quit smoking, you lower your risk for getting serious diseases and conditions. They can include:  Lung cancer or lung disease.  Heart disease.  Stroke.  Heart attack.  Not being able to have children (infertility).  Weak bones (osteoporosis) and broken bones (fractures). If you have coughing, wheezing, and shortness of breath, those symptoms may get better when you quit. You may also get sick less often. If you are pregnant, quitting smoking can help to lower your chances of having a baby of low birth weight. What can I do to help me quit smoking? Talk with your doctor about what can help you quit smoking. Some things you can do (strategies) include:  Quitting smoking totally, instead of slowly cutting back how much you smoke over a period of time.  Going to in-person counseling. You are more likely to quit if you go to many counseling sessions.  Using resources and support systems, such as: ? Database administrator with a Social worker. ? Phone quitlines. ? Careers information officer. ? Support groups or group counseling. ? Text messaging programs. ? Mobile phone apps or applications.  Taking medicines. Some of these medicines may have nicotine in them. If you are pregnant or breastfeeding, do not take any medicines to quit smoking unless your doctor says it is okay. Talk with your doctor about counseling or other things that can help you. Talk with your doctor about using more than one strategy at the same time, such as taking medicines while you are also going to in-person counseling. This can help make quitting easier. What things can I do to make it easier to quit? Quitting smoking might feel very hard at first, but there is  a lot that you can do to make it easier. Take these steps:  Talk to your family and friends. Ask them to support and encourage you.  Call phone quitlines, reach out to support groups, or work with a Social worker.  Ask people who smoke to not smoke around you.  Avoid places that make you want (  trigger) to smoke, such as: ? Bars. ? Parties. ? Smoke-break areas at work.  Spend time with people who do not smoke.  Lower the stress in your life. Stress can make you want to smoke. Try these things to help your stress: ? Getting regular exercise. ? Deep-breathing exercises. ? Yoga. ? Meditating. ? Doing a body scan. To do this, close your eyes, focus on one area of your body at a time from head to toe, and notice which parts of your body are tense. Try to relax the muscles in those areas.  Download or buy apps on your mobile phone or tablet that can help you stick to your quit plan. There are many free apps, such as QuitGuide from the [employer] Office manager for Disease Control and Prevention). You can find more support from smokefree.gov and other websites. This information is not intended to replace advice given to you by your health care provider. Make sure you discuss any questions you have with your health care provider. Document Released: 07/15/2009 Document Revised: 05/16/2016 Document Reviewed: 02/02/2015 Elsevier Interactive Patient Education  2019 Elsevier Inc.      Chronic Obstructive Pulmonary Disease Chronic obstructive pulmonary disease (COPD) is a long-term (chronic) lung problem. When you have COPD, it is hard for air to get in and out of your lungs. Usually the condition gets worse over time, and your lungs will never return to normal. There are things you can do to keep yourself as healthy as possible.  Your doctor may treat your condition with: ? Medicines. ? Oxygen. ? Lung surgery.  Your doctor may also recommend: ? Rehabilitation. This includes steps to make your body work  better. It may involve a team of specialists. ? Quitting smoking, if you smoke. ? Exercise and changes to your diet. ? Comfort measures (palliative care). Follow these instructions at home: Medicines  Take over-the-counter and prescription medicines only as told by your doctor.  Talk to your doctor before taking any cough or allergy medicines. You may need to avoid medicines that cause your lungs to be dry. Lifestyle  If you smoke, stop. Smoking makes the problem worse. If you need help quitting, ask your doctor.  Avoid being around things that make your breathing worse. This may include smoke, chemicals, and fumes.  Stay active, but remember to rest as well.  Learn and use tips on how to relax.  Make sure you get enough sleep. Most adults need at least 7 hours of sleep every night.  Eat healthy foods. Eat smaller meals more often. Rest before meals. Controlled breathing Learn and use tips on how to control your breathing as told by your doctor. Try:  Breathing in (inhaling) through your nose for 1 second. Then, pucker your lips and breath out (exhale) through your lips for 2 seconds.  Putting one hand on your belly (abdomen). Breathe in slowly through your nose for 1 second. Your hand on your belly should move out. Pucker your lips and breathe out slowly through your lips. Your hand on your belly should move in as you breathe out.  Controlled coughing Learn and use controlled coughing to clear mucus from your lungs. Follow these steps: 1. Lean your head a little forward. 2. Breathe in deeply. 3. Try to hold your breath for 3 seconds. 4. Keep your mouth slightly open while coughing 2 times. 5. Spit any mucus out into a tissue. 6. Rest and do the steps again 1 or 2 times as needed. General instructions  Make sure you get all the shots (vaccines) that your doctor recommends. Ask your doctor about a flu shot and a pneumonia shot.  Use oxygen therapy and pulmonary rehabilitation  if told by your doctor. If you need home oxygen therapy, ask your doctor if you should buy a tool to measure your oxygen level (oximeter).  Make a COPD action plan with your doctor. This helps you to know what to do if you feel worse than usual.  Manage any other conditions you have as told by your doctor.  Avoid going outside when it is very hot, cold, or humid.  Avoid people who have a sickness you can catch (contagious).  Keep all follow-up visits as told by your doctor. This is important. Contact a doctor if:  You cough up more mucus than usual.  There is a change in the color or thickness of the mucus.  It is harder to breathe than usual.  Your breathing is faster than usual.  You have trouble sleeping.  You need to use your medicines more often than usual.  You have trouble doing your normal activities such as getting dressed or walking around the house. Get help right away if:  You have shortness of breath while resting.  You have shortness of breath that stops you from: ? Being able to talk. ? Doing normal activities.  Your chest hurts for longer than 5 minutes.  Your skin color is more blue than usual.  Your pulse oximeter shows that you have low oxygen for longer than 5 minutes.  You have a fever.  You feel too tired to breathe normally. Summary  Chronic obstructive pulmonary disease (COPD) is a long-term lung problem.  The way your lungs work will never return to normal. Usually the condition gets worse over time. There are things you can do to keep yourself as healthy as possible.  Take over-the-counter and prescription medicines only as told by your doctor.  If you smoke, stop. Smoking makes the problem worse. This information is not intended to replace advice given to you by your health care provider. Make sure you discuss any questions you have with your health care provider. Document Released: 03/06/2008 Document Revised: 10/23/2016 Document  Reviewed: 10/23/2016 Elsevier Interactive Patient Education  2019 [employer].

## 2018-11-01 NOTE — Progress Notes (Signed)
@Patient  ID: Tammy Mcpherson, adult    DOB: [birth year], 63 y.o.   MRN: [id number]  Chief Complaint  Patient presents with  . Follow-up    PFT results    Referring provider: [name], PA*  HPI:  63 year old female current every day smoker followed in our office for suspected COPD  PMH: Hypertension, anemia, hyperlipidemia, gastric AVM, PAD, vitamin D deficiency, vitamin B12 deficiency, prediabetes Smoker/ Smoking History: Current Everyday Smoker.  Smoking half a pack per day.  33-pack-year smoking history Maintenance: Symbicort 160, Spiriva Respimat 2.5 Pt of: Dr. [name]   11/01/2018  - Visit   63 year old female current every day smoker (smoking about 10 cigarettes a day) presenting to our office after completing pulmonary function testing.  Pulmonary function test results listed below:  11/01/2018- pulmonary function test-FVC 2.42 (76% predicted), postbronchodilator ratio 74, postbronchodilator FEV1 1.52 (62% predicted), no significant bronchodilator response, DLCO was 51 >>>Patient did take Spiriva prior to testing >>>Patient has not taken Symbicort today  Patient reports that she continues to be short of breath but she feels this may be back to her baseline.  She continues to report adherence to Symbicort 160 as well as Spiriva 2.5.  She reports use her rescue inhaler about 4 times a day.  Patient denies baseline cough or sputum production.  MMRC - Breathlessness Score 3 - I stop for breath after walking about 100 yards or after a few minutes on level ground (isle at grocery store is 137ft)     Tests:   10/08/2018-CBC with differential-eosinophils relative 1, eosinophils absolute 0.2  04/30/2016-CT angios chest- cardiomegaly with interstitial pulmonary edema very small bilateral pleural effusions, no pulmonary emboli identified  03/20/2018-echocardiogram-LV ejection fraction 65 to 57%, grade 1 diastolic dysfunction, mild LVH  11/01/2018- pulmonary function test-FVC  2.42 (76% predicted), postbronchodilator ratio 74, postbronchodilator FEV1 1.52 (62% predicted), no significant bronchodilator response, DLCO was 51 >>>Patient did take Spiriva prior to testing >>>Patient has not taken Symbicort today  FENO:  No results found for: NITRICOXIDE  PFT: No flowsheet data found.  Imaging: No results found.    Specialty Problems      Pulmonary Problems   Chronic obstructive pulmonary disease ([place])    11/01/2018- pulmonary function test-FVC 2.42 (76% predicted), postbronchodilator ratio 74, postbronchodilator FEV1 1.52 (62% predicted), no significant bronchodilator response, DLCO was 51 >>>Patient did take Spiriva prior to testing >>>Patient has not taken Symbicort today      Dyspnea on effort      Allergies  Allergen Reactions  . Bee Venom Swelling  . Codeine Anaphylaxis    Tongue swelled  . Penicillins Swelling and Rash    Has patient had a PCN reaction causing immediate rash, facial/tongue/throat swelling, SOB or lightheadedness with hypotension: No, delayed Has patient had a PCN reaction causing severe rash involving mucus membranes or skin necrosis: No Has patient had a PCN reaction that required hospitalization No Has patient had a PCN reaction occurring within the last 10 years: No If all of the above answers are "NO", then may proceed with Cephalosporin use.   . Bupropion Other (See Comments)    "Made my skin crawl"  . Sudafed [Pseudoephedrine Hcl] Rash    Immunization History  Administered Date(s) Administered  . Influenza,inj,Quad PF,6+ Mos 07/04/2017, 07/10/2018  . Pneumococcal Polysaccharide-23 11/01/2018   Pneumovax 23 will be given today  Past Medical History:  Diagnosis Date  . Anemia 04/30/2016  . Anxiety   . Blood transfusion without reported diagnosis   .  Cardiomyopathy ([place])    a. EF 40-45% by echo in 04/2016 b. Improved to 60-65% by repeat imaging in 2018  . CHF (congestive heart failure) ([place])   . COPD (chronic  obstructive pulmonary disease) ([place])   . Coronary artery calcification seen on CT scan   . Essential hypertension   . GERD (gastroesophageal reflux disease)   . History of bronchitis   . History of GI bleed   . Hyperlipidemia   . Iron deficiency anemia 05/12/2016   Bleeding ulcers  . Pollen allergy   . PSVT (paroxysmal supraventricular tachycardia) ([place])   . PVC's (premature ventricular contractions)     Tobacco History: Social History   Tobacco Use  Smoking Status Current Every Day Smoker  . Packs/day: 0.75  . Years: 44.00  . Pack years: 33.00  . Types: Cigarettes  . Start date: 05/10/1975  Smokeless Tobacco Never Used  Tobacco Comment   pack per day 1.15.20   Ready to quit: Yes Counseling given: Yes Comment: pack per day 1.15.20  Smoking assessment and cessation counseling  Patient currently smoking: 10 cigarettes I have advised the patient to quit/stop smoking as soon as possible due to high risk for multiple medical problems.  It will also be very difficult for [place] to manage patient's  respiratory symptoms and status if we continue to expose her lungs to a known irritant.  We do not advise e-cigarettes as a form of stopping smoking.  Patient is willing to stop smoking. Pt hasnt set quit date.   I have advised the patient that we can assist and have options of nicotine replacement therapy, provided smoking cessation education today, provided smoking cessation counseling, and provided cessation resources.  Discussed with patient at length importance of stopping smoking.  Discussed nicotine replacement therapies versus Chantix.  Patient is willing to try Chantix at this time.  We will also refer patient to lung cancer screening program based off of her 33-pack-year smoking history.  Follow-up next office visit office visit for assessment of smoking cessation.  Smoking cessation counseling advised for: 14 min    Outpatient Encounter Medications as of 11/01/2018    Medication Sig  . acetaminophen (TYLENOL) 500 MG tablet Take 500 mg by mouth at bedtime as needed for moderate pain.  Marland Kitchen albuterol (PROAIR HFA) 108 (90 Base) MCG/ACT inhaler INHALE 2 PUFFS INTO THE LUNGS EVERY 6 HOURS AS NEEDED FOR WHEEZING OR SHORTNESS OF BREATH.  Marland Kitchen albuterol (PROVENTIL) (2.5 MG/3ML) 0.083% nebulizer solution Take 3 mLs (2.5 mg total) by nebulization every 4 (four) hours as needed for wheezing or shortness of breath.  Marland Kitchen aspirin 81 MG chewable tablet Chew 81 mg by mouth daily.  Marland Kitchen atorvastatin (LIPITOR) 20 MG tablet Take 1 tablet (20 mg total) by mouth daily.  . bisoprolol (ZEBETA) 10 MG tablet Take 1 tablet (10 mg total) by mouth daily.  . budesonide-formoterol (SYMBICORT) 160-4.5 MCG/ACT inhaler Inhale 2 puffs into the lungs every 12 (twelve) hours.  . cholecalciferol (VITAMIN D) 1000 units tablet Take 1,000 Units by mouth daily.   Marland Kitchen losartan (COZAAR) 100 MG tablet TAKE ONE (1) TABLET BY MOUTH EVERY DAY  . Multiple Vitamin (MULTIVITAMIN WITH MINERALS) TABS tablet Take 1 tablet by mouth daily.  . pantoprazole (PROTONIX) 40 MG tablet TAKE ONE (1) TABLET BY MOUTH EVERY DAY  . SPIRIVA RESPIMAT 1.25 MCG/ACT AERS INHALE TWO PUFFS INTO THE LUNGS DAILY  . Tiotropium Bromide Monohydrate (SPIRIVA RESPIMAT) 2.5 MCG/ACT AERS Inhale 2 puffs into the lungs daily.  Marland Kitchen  varenicline (CHANTIX PAK) 0.5 MG X 11 & 1 MG X 42 tablet Take one 0.5 mg tablet by mouth once daily for 3 days, then increase to one 0.5 mg tablet twice daily for 4 days, then increase to one 1 mg tablet twice daily.   No facility-administered encounter medications on file as of 11/01/2018.      Review of Systems  Review of Systems  Constitutional: Positive for fatigue. Negative for chills, fever and unexpected weight change.  HENT: Negative for congestion, ear pain, postnasal drip, sinus pressure and sinus pain.   Respiratory: Positive for shortness of breath. Negative for cough, chest tightness and wheezing.    Cardiovascular: Negative for chest pain and palpitations.  Gastrointestinal: Negative for diarrhea, nausea and vomiting.  Musculoskeletal: Negative for arthralgias.  Skin: Negative for color change.  Allergic/Immunologic: Negative for environmental allergies and food allergies.  Neurological: Negative for dizziness, light-headedness and headaches.  Psychiatric/Behavioral: Negative for dysphoric mood. The patient is not nervous/anxious.   All other systems reviewed and are negative.    Physical Exam  BP (!) 148/80 (BP Location: Left Arm, Cuff Size: Normal)   Pulse 97   Ht 5\' 3"  (1.6 m)   Wt 163 lb (73.9 kg)   SpO2 96%   BMI 28.87 kg/m   Wt Readings from Last 5 Encounters:  11/01/18 163 lb (73.9 kg)  10/16/18 161 lb (73 kg)  10/11/18 161 lb 12.8 oz (73.4 kg)  10/10/18 161 lb (73 kg)  08/09/18 160 lb 1.6 oz (72.6 kg)   Room air  Physical Exam  Constitutional: She is oriented to person, place, and time and well-developed, well-nourished, and in no distress. Vital signs are normal. She does not have a sickly appearance. No distress.  Frail female  HENT:  Head: Normocephalic and atraumatic.  Right Ear: Hearing, tympanic membrane, external ear and ear canal normal.  Left Ear: Hearing, tympanic membrane, external ear and ear canal normal.  Nose: Mucosal edema and rhinorrhea present. Right sinus exhibits no maxillary sinus tenderness and no frontal sinus tenderness. Left sinus exhibits no maxillary sinus tenderness and no frontal sinus tenderness.  Mouth/Throat: Uvula is midline and oropharynx is clear and moist. No oropharyngeal exudate.  + Postnasal drip  Eyes: Pupils are equal, round, and reactive to light.  Neck: Normal range of motion. Neck supple. No JVD present.  Cardiovascular: Normal rate, regular rhythm and normal heart sounds.  Pulmonary/Chest: Effort normal and breath sounds normal. No accessory muscle usage. No respiratory distress. She has no decreased breath sounds.  She has no wheezes. She has no rhonchi.  Musculoskeletal: Normal range of motion.  Lymphadenopathy:    She has no cervical adenopathy.  Neurological: She is alert and oriented to person, place, and time. Gait normal.  Skin: Skin is warm and dry. She is not diaphoretic.  Psychiatric: Mood, memory, affect and judgment normal.  Nursing note and vitals reviewed.   SIX MIN WALK 11/01/2018  Supplimental Oxygen during Test? (L/min) No  Tech Comments: Pt did not desat, however, she could not complete all 3x laps due to SOB, mild dizziness, and mild chest pain.      Lab Results:  CBC    Component Value Date/Time   WBC 15.1 (H) 10/08/2018 1110   RBC 4.51 10/08/2018 1110   HGB 13.3 10/08/2018 1110   HCT 42.3 10/08/2018 1110   PLT 421 (H) 10/08/2018 1110   MCV 93.8 10/08/2018 1110   MCH 29.5 10/08/2018 1110   MCHC 31.4 10/08/2018 1110  RDW 14.9 10/08/2018 1110   LYMPHSABS 1.8 10/08/2018 1110   MONOABS 1.4 (H) 10/08/2018 1110   EOSABS 0.2 10/08/2018 1110   BASOSABS 0.1 10/08/2018 1110    BMET    Component Value Date/Time   NA 137 10/08/2018 1110   NA 143 02/28/2018 1732   K 3.9 10/08/2018 1110   CL 107 10/08/2018 1110   CO2 21 (L) 10/08/2018 1110   GLUCOSE 250 (H) 10/08/2018 1110   BUN 9 10/08/2018 1110   BUN 10 02/28/2018 1732   CREATININE 0.74 10/08/2018 1110   CREATININE 0.73 09/28/2017 1201   CALCIUM 9.3 10/08/2018 1110   GFRNONAA >60 10/08/2018 1110   GFRNONAA 90 09/28/2017 1201   GFRAA >60 10/08/2018 1110   GFRAA 104 09/28/2017 1201    BNP    Component Value Date/Time   BNP 446.0 (H) 04/30/2016 0719    ProBNP No results found for: PROBNP    Assessment & Plan:     Chronic obstructive pulmonary disease (HCC) Assessment: Concavity and flow volume loops on pulmonary function test Ratio 74 and FEV1 of 1.52, with patient using Spiriva 2.5 this morning Maintained well on Symbicort 160 and Spiriva 2.5 [place] use 4 times daily DLCO 51 Lungs diminished but  clear  Plan: Continue Symbicort 160 Continue Spiriva 2.5 Walk in office today due to decreased DLCO Pneumovax 23 today Referral to lung cancer screening program Emphasized the importance of stopping smoking Chantix prescribed today Follow-up with our office in 4 weeks  Tobacco abuse Assessment: 33-pack-year smoking history Current every day smoker Willing to try to quit Has not set quit date  Plan: Chantix prescription today >>>Patient to contact our office with any concerns regarding Chantix >>> Counseled to monitor for changes in mental health status, mood swings, vivid dreams >>> Patient and spouse discussed the patient will start Chantix when spouse is on a break from work and has 4 days off to monitor for symptoms If patient fails Chantix then will prescribe 21 mg nicotine patches as well as 4 mg nicotine lozenges/or gum Referred patient to lung cancer screening program Pneumovax 23 today  Follow-up with our office in 4 weeks     Lauraine Rinne, NP 11/01/2018   This appointment was 42 min long with over 50% of the time in direct face-to-face patient care, assessment, plan of care, and follow-up.

## 2018-11-01 NOTE — Progress Notes (Signed)
PFT done today. 

## 2018-11-04 NOTE — Progress Notes (Signed)
PCCM: Agree. Referral to LDCT Screening program. Thanks for seeing her.  [facility] 11/04/2018 8:49 AM

## 2018-11-05 ENCOUNTER — Telehealth: Payer: Self-pay | Admitting: Pulmonary Disease

## 2018-11-05 DIAGNOSIS — F17210 Nicotine dependence, cigarettes, uncomplicated: Principal | ICD-10-CM

## 2018-11-05 DIAGNOSIS — Z122 Encounter for screening for malignant neoplasm of respiratory organs: Secondary | ICD-10-CM

## 2018-11-05 DIAGNOSIS — Z87891 Personal history of nicotine dependence: Secondary | ICD-10-CM

## 2018-11-07 NOTE — Telephone Encounter (Signed)
LMTC x 1  

## 2018-11-07 NOTE — Telephone Encounter (Signed)
Spoke with pt's husband and scheduled pt for [facility] 12/04/18 12:00 CT ordered Nothing further needed

## 2018-11-11 ENCOUNTER — Ambulatory Visit (HOSPITAL_COMMUNITY): Payer: PPO

## 2018-11-14 ENCOUNTER — Encounter (HOSPITAL_COMMUNITY): Payer: Self-pay

## 2018-11-14 ENCOUNTER — Inpatient Hospital Stay (HOSPITAL_COMMUNITY): Payer: PPO | Attending: Oncology

## 2018-11-14 VITALS — BP 165/67 | HR 83 | Temp 97.6°F | Resp 18

## 2018-11-14 DIAGNOSIS — D508 Other iron deficiency anemias: Secondary | ICD-10-CM | POA: Diagnosis not present

## 2018-11-14 MED ORDER — CYANOCOBALAMIN 1000 MCG/ML IJ SOLN
1000.0000 ug | Freq: Once | INTRAMUSCULAR | Status: AC
  Administered 2018-11-14: 1000 ug via INTRAMUSCULAR
  Filled 2018-11-14: qty 1

## 2018-11-14 NOTE — Patient Instructions (Signed)
[facility] at [facility]  Discharge Instructions:   _______________________________________________________________  Thank you for choosing [facility] at [facility] to provide your oncology and hematology care.  To afford each patient quality time with our providers, please arrive at least 15 minutes before your scheduled appointment.  You need to re-schedule your appointment if you arrive 10 or more minutes late.  We strive to give you quality time with our providers, and arriving late affects you and other patients whose appointments are after yours.  Also, if you no show three or more times for appointments you may be dismissed from the clinic.  Again, thank you for choosing [facility] at [facility]. Our hope is that these requests will allow you access to exceptional care and in a timely manner. _______________________________________________________________  If you have questions after your visit, please contact our office at [phone] between the hours of 8:30 a.m. and 5:00 p.m. Voicemails left after 4:30 p.m. will not be returned until the following business day. _______________________________________________________________  For prescription refill requests, have your pharmacy contact our office. _______________________________________________________________  Recommendations made by the consultant and any test results will be sent to your referring physician. _______________________________________________________________ 

## 2018-11-14 NOTE — Progress Notes (Signed)
Patient tolerated B12 injection with no complaints voiced.  Site clean and dry with band aid applied.  VSS with discharge and left ambulatory with no s/s of distress noted.

## 2018-11-18 ENCOUNTER — Telehealth: Payer: Self-pay | Admitting: Gastroenterology

## 2018-11-18 ENCOUNTER — Other Ambulatory Visit: Payer: Self-pay | Admitting: Gastroenterology

## 2018-11-18 DIAGNOSIS — K279 Peptic ulcer, site unspecified, unspecified as acute or chronic, without hemorrhage or perforation: Secondary | ICD-10-CM

## 2018-11-18 DIAGNOSIS — K31819 Angiodysplasia of stomach and duodenum without bleeding: Secondary | ICD-10-CM

## 2018-11-18 NOTE — Telephone Encounter (Signed)
Forwarding to refill box.  

## 2018-11-18 NOTE — Telephone Encounter (Signed)
Pt is wanting a 90 day supply of pantoprazole 40 mg called into [facility]. She has OV with [facility] on 2/19 and wants to be able to leave here and go straight to pharmacy to pick up prescription. [phone]

## 2018-11-20 ENCOUNTER — Encounter: Payer: Self-pay | Admitting: Gastroenterology

## 2018-11-20 ENCOUNTER — Ambulatory Visit: Payer: BLUE CROSS/BLUE SHIELD | Admitting: Gastroenterology

## 2018-11-20 DIAGNOSIS — D500 Iron deficiency anemia secondary to blood loss (chronic): Secondary | ICD-10-CM

## 2018-11-20 DIAGNOSIS — K909 Intestinal malabsorption, unspecified: Secondary | ICD-10-CM

## 2018-11-20 DIAGNOSIS — R197 Diarrhea, unspecified: Secondary | ICD-10-CM | POA: Diagnosis not present

## 2018-11-20 DIAGNOSIS — Z1159 Encounter for screening for other viral diseases: Secondary | ICD-10-CM | POA: Diagnosis not present

## 2018-11-20 NOTE — Patient Instructions (Addendum)
YOUR WATERY STOOLS ARE MOST LIKELY DUE TO HIGH FAT CONTENT IN YOUR DIET AND DAIRY.   TO REDUCE EPISODE OF WATERY STOOLS:   1. REDUCE FAT IN YOUR DUR DIET. OR IF YOU EAT A HIGH FAT MEAL, CHEW ONE TUMS WITH MEALS UP TO THREE TIMES A  DAY.   2. FOLLOW A LACTOSE FREE OR LOW LACTOSE DIET.    3. ADD LACTASE 2-3 PILLS WITH MEALS UP TO THREE TIMES A DAY.  DRINK WATER TO KEEP YOUR URINE LIGHT YELLOW.   EVERYONE BORN BETWEEN 1944-1965 SHOULD BE SCREENED FOR HEPATITIS C. YOU CAN COMPLETE THE SCREENING TEST WHEN YOU HAVE YOUR NEXT BLOOD DRAW.  FOLLOW UP IN 4 MOS.  PLEASE CALL or SEND me A MY CHART MESSAGE IF YOU HAVE QUESTIONS OR CONCERNS.   Lactose Free Diet Lactose is a carbohydrate that is found mainly in milk and milk products, as well as in foods with added milk or whey. Lactose must be digested by the enzyme in order to be used by the body. Lactose intolerance occurs when there is a shortage of lactase. When your body is not able to digest lactose, you may feel sick to your stomach (nausea), bloating, cramping, gas and diarrhea.  There are many dairy products that may be tolerated better than milk by some people:  The use of cultured dairy products such as yogurt, buttermilk, cottage cheese, and sweet acidophilus milk (Kefir) for lactase-deficient individuals is usually well tolerated. This is because the healthy bacteria help digest lactose.   Lactose-hydrolyzed milk (Lactaid) contains 40-90% less lactose than milk and may also be well tolerated.    SPECIAL NOTES  Lactose is a carbohydrates. The major food source is dairy products. Reading food labels is important. Many products contain lactose even when they are not made from milk. Look for the following words: whey, milk solids, dry milk solids, nonfat dry milk powder. Typical sources of lactose other than dairy products include breads, candies, cold cuts, prepared and processed foods, and commercial sauces and gravies.   All foods must  be prepared without milk, cream, or other dairy foods.   Soy milk and lactose-free supplements (LACTASE) may be used as an alternative to milk.   FOOD GROUP ALLOWED/RECOMMENDED AVOID/USE SPARINGLY  BREADS / STARCHES 4 servings or more* Breads and rolls made without milk. [place], [place], or [place] bread. Breads and rolls that contain milk. Prepared mixes such as muffins, biscuits, waffles, pancakes. Sweet rolls, donuts, [place] toast (if made with milk or lactose).  Crackers: Soda crackers, graham crackers. Any crackers prepared without lactose. Zwieback crackers, corn curls, or any that contain lactose.  Cereals: Cooked or dry cereals prepared without lactose (read labels). Cooked or dry cereals prepared with lactose (read labels). Total, Cocoa Krispies. Special K.  Potatoes / Pasta / Rice: Any prepared without milk or lactose. Popcorn. Instant potatoes, frozen [place] fries, scalloped or au gratin potatoes.  VEGETABLES 2 servings or more Fresh, frozen, and canned vegetables. Creamed or breaded vegetables. Vegetables in a cheese sauce or with lactose-containing margarines.  FRUIT 2 servings or more All fresh, canned, or frozen fruits that are not processed with lactose. Any canned or frozen fruits processed with lactose.  MEAT & SUBSTITUTES 2 servings or more (4 to 6 oz. total per day) Plain beef, chicken, fish, [place], lamb, veal, pork, or ham. Kosher prepared meat products. Strained or junior meats that do not contain milk. Eggs, soy meat substitutes, nuts. Scrambled eggs, omelets, and souffles that contain milk.  Creamed or breaded meat, fish, or fowl. Sausage products such as wieners, liver sausage, or cold cuts that contain milk solids. Cheese, cottage cheese, or cheese spreads.  MILK None. (See "BEVERAGES" for milk substitutes. See "DESSERTS" for ice cream and frozen desserts.) Milk (whole, 2%, skim, or chocolate). Evaporated, powdered, or condensed milk; malted milk.  SOUPS & COMBINATION  FOODS Bouillon, broth, vegetable soups, clear soups, consomms. Homemade soups made with allowed ingredients. Combination or prepared foods that do not contain milk or milk products (read labels). Cream soups, chowders, commercially prepared soups containing lactose. Macaroni and cheese, pizza. Combination or prepared foods that contain milk or milk products.  DESSERTS & SWEETS In moderation Water and fruit ices; gelatin; angel food cake. Homemade cookies, pies, or cakes made from allowed ingredients. Pudding (if made with water or a milk substitute). Lactose-free tofu desserts. Sugar, honey, corn syrup, jam, jelly; marmalade; molasses (beet sugar); Pure sugar candy; marshmallows. Ice cream, ice milk, sherbet, custard, pudding, frozen yogurt. Commercial cake and cookie mixes. Desserts that contain chocolate. Pie crust made with milk-containing margarine; reduced-calorie desserts made with a sugar substitute that contains lactose. Toffee, peppermint, butterscotch, chocolate, caramels.  FATS & OILS In moderation Butter (as tolerated; contains very small amounts of lactose). Margarines and dressings that do not contain milk, Vegetable oils, shortening, Miracle Whip, mayonnaise, nondairy cream & whipped toppings without lactose or milk solids added (examples: Coffee Rich, Carnation Coffeemate, Rich's Whipped Topping, PolyRich). Berniece Salines. Margarines and salad dressings containing milk; cream, cream cheese; peanut butter with added milk solids, sour cream, chip dips, made with sour cream.  BEVERAGES Carbonated drinks; tea; coffee and freeze-dried coffee; some instant coffees (check labels). Fruit drinks; fruit and vegetable juice; Rice or Soy milk. Ovaltine, hot chocolate. Some cocoas; some instant coffees; instant iced teas; powdered fruit drinks (read labels).   CONDIMENTS / MISCELLANEOUS Soy sauce, carob powder, olives, gravy made with water, baker's cocoa, pickles, pure seasonings and spices, wine, pure  monosodium glutamate, catsup, mustard. Some chewing gums, chocolate, some cocoas. Certain antibiotics and vitamin / mineral preparations. Spice blends if they contain milk products. MSG extender. Artificial sweeteners that contain lactose such as Equal (Nutra-Sweet) and Sweet 'n Low. Some nondairy creamers (read labels).

## 2018-11-20 NOTE — Assessment & Plan Note (Signed)
SYMPTOMS NOT IDEALLY CONTROLLED AND ARE MOST LIKELY DUE TO BILE SALT INDUCED DIARRHEA AND LACTOSE INTOLERANCE,LESS LIKELY SMALL INTESTINE BACTERIAL OVERGROWTH.  TO REDUCE EPISODE OF WATERY STOOLS:   1. REDUCE FAT IN YOUR DUR DIET. OR IF YOU EAT A HIGH FAT MEAL, CHEW ONE TUMS WITH MEALS UP TO THREE TIMES A  DAY.   2. FOLLOW A LACTOSE FREE OR LOW LACTOSE DIET.    3. ADD LACTASE 2-3 PILLS WITH MEALS UP TO THREE TIMES A DAY.  DRINK WATER TO KEEP YOUR URINE LIGHT YELLOW.  FOLLOW UP IN 4 MOS.  PLEASE CALL or SEND me A MY CHART MESSAGE IF YOU HAVE QUESTIONS OR CONCERNS.

## 2018-11-20 NOTE — Progress Notes (Signed)
Subjective:    [name], adult    DOB: August 05, 1956, 63 y.o.   MRN: [id number]  [name], PA-C  HPI Having watery stools 2-3 times a week: large quantity. Accidents: at least 2x/week. MILK: A 1% GLASS/OATMEAL/LITTLE DEBBIE OATMEAL PIES OR CINNAMON DONUTS: 2-3 TIMES A WEEK. CHEESE: 1/2 SLICE A DAY. ICE CREAM: NONE. NO TRAVEL OR ABX RECENTLY. WELL WATER: CITY/BOTTLED. MAY SEE A COUPLE DROPS IN STOOL. SEES NL STOOL: 2X/WEEK. MAY NOTICE WHEN SHE EATS SHE MAY NOTICE STOMACH SWELLS OR BURNS. TALES A TUMS AND IT GOES AWAY(1X/WEEK). EATS FRIED FOODS: STOPPED FOR THE MOST PART. HAS BEEN TOLD SHE WAS LACTOSE INTOLERANCE.   PT DENIES FEVER, CHILLS, HEMATEMESIS, nausea, vomiting, melena, CHEST PAIN, SHORTNESS OF BREATH,  CHANGE IN BOWEL IN HABITS, constipation, problems swallowing, problems with sedation, OR heartburn.  Past Medical History:  Diagnosis Date  . Anemia 04/30/2016  . Anxiety   . Blood transfusion without reported diagnosis   . Cardiomyopathy ([place])    a. EF 40-45% by echo in 04/2016 b. Improved to 60-65% by repeat imaging in 2018  . CHF (congestive heart failure) ([place])   . COPD (chronic obstructive pulmonary disease) ([place])   . Coronary artery calcification seen on CT scan   . Essential hypertension   . GERD (gastroesophageal reflux disease)   . History of bronchitis   . History of GI bleed   . Hyperlipidemia   . Iron deficiency anemia 05/12/2016   Bleeding ulcers  . Pollen allergy   . PSVT (paroxysmal supraventricular tachycardia) ([place])   . PVC's (premature ventricular contractions)     Past Surgical History:  Procedure Laterality Date  . ABDOMINAL HYSTERECTOMY     polyps  about age 83  . Barbie Banner OSTEOTOMY Right 07/10/2013   Procedure: Barbie Banner OSTEOTOMY RIGHT FOOT;  Surgeon: Marcheta Grammes, DPM;  Location: [facility];  Service: Orthopedics;  Laterality: Right;  . AMPUTATION Left 05/09/2017   Procedure: AMPUTATION 5TH TOE LEFT FOOT;  Surgeon:  [name], DPM;  Location: [facility];  Service: Podiatry;  Laterality: Left;  . AMPUTATION Left 06/13/2017   Procedure: AMPUTATION 2ND TOE LEFT FOOT;  Surgeon: [name], DPM;  Location: [facility];  Service: Podiatry;  Laterality: Left;  . BUNIONECTOMY Right 07/10/2013   Procedure: VOGLER BUNIONECTOMY RIGHT FOOT;  Surgeon: Marcheta Grammes, DPM;  Location: [facility];  Service: Orthopedics;  Laterality: Right;  . CHOLECYSTECTOMY N/A 08/22/2017   Procedure: LAPAROSCOPIC CHOLECYSTECTOMY;  Surgeon: Virl Cagey, MD;  Location: [facility];  Service: General;  Laterality: N/A;  . COLONOSCOPY N/A 07/07/2016   Dr. Oneida Alar; redundant left colon, diverticulosis at hepatic flexure, non-bleeding internal hemorrhoids  . ESOPHAGOGASTRODUODENOSCOPY N/A 07/07/2016   Dr. Oneida Alar: many non-bleeding cratered gastric ulcers without stigmata of bleeding in gastric antrum. four 2-3 mm angioectasias without bleeding in duodenal bulb and second portion of duodenum s/p APC. Chroni gastritis on path.   . ESOPHAGOGASTRODUODENOSCOPY N/A 08/03/2017   Dr. Oneida Alar: erosive gastritis, AVMs. Found a single non-bleeding angioectasia in stomach, s/p APC therapy. Four non-bleeding angioectasias in duodenum s/p APC. Non-bleeding erosive gastropathy  . IR ANGIOGRAM EXTREMITY LEFT  04/24/2017  . IR FEM POP ART ATHERECT INC PTA MOD SED  04/24/2017  . IR INFUSION THROMBOL ARTERIAL INITIAL (MS)  04/24/2017  . IR RADIOLOGIST EVAL & MGMT  12/05/2016  . IR US GUIDE VASC ACCESS RIGHT  04/24/2017  . LIVER BIOPSY N/A 08/22/2017   Procedure: LIVER BIOPSY;  Surgeon: Virl Cagey,  MD;  Location: [facility];  Service: General;  Laterality: N/A;  . METATARSAL HEAD EXCISION Right 07/10/2013   Procedure: METATARSAL HEAD RESECTION OF DIGITS 2 AND 3 RIGHT FOOT;  Surgeon: Marcheta Grammes, DPM;  Location: [facility];  Service: Orthopedics;  Laterality: Right;  . PROXIMAL INTERPHALANGEAL FUSION (PIP) Right 07/10/2013   Procedure: ARTHRODESIS PIPJ   2ND DIGIT RIGHT FOOT;  Surgeon: Marcheta Grammes, DPM;  Location: [facility];  Service: Orthopedics;  Laterality: Right;   Allergies  Allergen Reactions  . Bee Venom Swelling  . Codeine Anaphylaxis    Tongue swelled  . Penicillins Swelling and Rash      . Bupropion Other (See Comments)    "Made my skin crawl"  . Sudafed [Pseudoephedrine Hcl] Rash    Current Outpatient Medications  Medication Sig    . acetaminophen (TYLENOL) 500 MG tablet Take 500 mg by mouth at bedtime as needed for moderate pain.    Marland Kitchen albuterol (PROVENTIL) (2.5 MG/3ML) 0.083% nebulizer solution Take 3 mLs (2.5 mg total) by nebulization every 4 (four) hours as needed for wheezing or shortness of breath.    Marland Kitchen aspirin 81 MG chewable tablet Chew 81 mg by mouth daily.    Marland Kitchen atorvastatin (LIPITOR) 20 MG tablet Take 1 tablet (20 mg total) by mouth daily.    . bisoprolol (ZEBETA) 10 MG tablet Take 1 tablet (10 mg total) by mouth daily.    Marland Kitchen losartan (COZAAR) 100 MG tablet TAKE ONE (1) TABLET BY MOUTH EVERY DAY    . Multiple Vitamin (MULTIVITAMIN WITH MINERALS) TABS tablet Take 1 tablet by mouth daily.    . pantoprazole (PROTONIX) 40 MG tablet TAKE ONE (1) TABLET BY MOUTH EVERY DAY    . SPIRIVA RESPIMAT 1.25 MCG/ACT AERS INHALE TWO PUFFS INTO THE LUNGS DAILY    . Tiotropium Bromide Monohydrate (SPIRIVA RESPIMAT) 2.5 MCG/ACT AERS Inhale 2 puffs into the lungs daily.    .      . budesonide-formoterol (SYMBICORT) 160-4.5 MCG/ACT inhaler Inhale 2 puffs into the lungs every 12 (twelve) hours. (Patient not taking: Reported on 11/20/2018)    . cholecalciferol (VITAMIN D) 1000 units tablet Take 1,000 Units by mouth daily.     .       Review of Systems PER HPI OTHERWISE ALL SYSTEMS ARE NEGATIVE.    Objective:   Physical Exam Vitals signs reviewed.  Constitutional:      General: She is not in acute distress.    Appearance: She is well-developed.  HENT:     Head: Normocephalic and atraumatic.     Mouth/Throat:     Pharynx:  No oropharyngeal exudate.  Eyes:     General: No scleral icterus.    Pupils: Pupils are equal, round, and reactive to light.  Neck:     Musculoskeletal: Normal range of motion and neck supple.  Cardiovascular:     Rate and Rhythm: Normal rate and regular rhythm.     Heart sounds: Normal heart sounds.  Pulmonary:     Effort: Pulmonary effort is normal. No respiratory distress.     Breath sounds: Normal breath sounds.  Abdominal:     General: Bowel sounds are normal. There is no distension.     Palpations: Abdomen is soft.     Tenderness: There is no abdominal tenderness.  Musculoskeletal:     Right lower leg: No edema.     Left lower leg: No edema.  Lymphadenopathy:     Cervical: No cervical adenopathy.  Neurological:  General: No focal deficit present.     Mental Status: She is alert and oriented to person, place, and time. Mental status is at baseline.  Psychiatric:        Mood and Affect: Mood normal.        Behavior: Behavior normal.     Comments: SLIGHTLY ANXIOUS MOOD, NL AFFECT       Assessment & Plan:

## 2018-11-20 NOTE — Assessment & Plan Note (Signed)
WILL ORDER HEP C AB WITH NEXT BLOOD DRAW.

## 2018-11-20 NOTE — Assessment & Plan Note (Signed)
LAST HB JAN 2020 NL. NO MELENA. HAS LOW VOLUME HEMATOCHEZIA. TCS UP TO DATE.  CONTINUE TO MONITOR SYMPTOMS. CBC Q6 MOS. FOLLOW UP IN 4 MOS.

## 2018-11-20 NOTE — Progress Notes (Signed)
ON RECALL  °

## 2018-11-21 NOTE — Progress Notes (Signed)
CC'D TO PCP °

## 2018-11-22 DIAGNOSIS — J449 Chronic obstructive pulmonary disease, unspecified: Secondary | ICD-10-CM | POA: Diagnosis not present

## 2018-11-22 NOTE — Telephone Encounter (Signed)
Looks like EG took care of yesterday

## 2018-12-04 ENCOUNTER — Ambulatory Visit (INDEPENDENT_AMBULATORY_CARE_PROVIDER_SITE_OTHER): Payer: PPO | Admitting: Acute Care

## 2018-12-04 ENCOUNTER — Encounter: Payer: Self-pay | Admitting: Acute Care

## 2018-12-04 ENCOUNTER — Ambulatory Visit (INDEPENDENT_AMBULATORY_CARE_PROVIDER_SITE_OTHER)
Admission: RE | Admit: 2018-12-04 | Discharge: 2018-12-04 | Disposition: A | Discharge status: Home / Self Care | Payer: PPO | Source: Ambulatory Visit | Attending: Acute Care | Admitting: Acute Care

## 2018-12-04 VITALS — BP 180/80 | HR 94 | Ht 64.0 in | Wt 163.4 lb

## 2018-12-04 DIAGNOSIS — F17210 Nicotine dependence, cigarettes, uncomplicated: Secondary | ICD-10-CM | POA: Diagnosis not present

## 2018-12-04 DIAGNOSIS — Z122 Encounter for screening for malignant neoplasm of respiratory organs: Secondary | ICD-10-CM

## 2018-12-04 DIAGNOSIS — Z87891 Personal history of nicotine dependence: Secondary | ICD-10-CM

## 2018-12-04 NOTE — Progress Notes (Signed)
Shared Decision Making Visit Lung Cancer Screening Program [phone])   Eligibility:  Age 63 y.o.  Pack Years Smoking History Calculation 34 pack year smoking history (# packs/per year x # years smoked)  Recent History of coughing up blood  no  Unexplained weight loss? no ( >Than 15 pounds within the last 6 months )  Prior History Lung / other cancer no (Diagnosis within the last 5 years already requiring surveillance chest CT Scans).  Smoking Status Current Smoker  Former Smokers: Years since quit: NA  Quit Date: NA  Visit Components:  Discussion included one or more decision making aids. yes  Discussion included risk/benefits of screening. yes  Discussion included potential follow up diagnostic testing for abnormal scans. yes  Discussion included meaning and risk of over diagnosis. yes  Discussion included meaning and risk of False Positives. yes  Discussion included meaning of total radiation exposure. yes  Counseling Included:  Importance of adherence to annual lung cancer LDCT screening. yes  Impact of comorbidities on ability to participate in the program. yes  Ability and willingness to under diagnostic treatment. yes  Smoking Cessation Counseling:  Current Smokers:   Discussed importance of smoking cessation. yes  Information about tobacco cessation classes and interventions provided to patient. yes  Patient provided with "ticket" for LDCT Scan. yes  Symptomatic Patient. no  Counseling  Diagnosis Code: Tobacco Use Z72.0  Asymptomatic Patient yes  Counseling (Intermediate counseling: > three minutes counseling) M0947  Former Smokers:   Discussed the importance of maintaining cigarette abstinence. yes  Diagnosis Code: Personal History of Nicotine Dependence. S96.283  Information about tobacco cessation classes and interventions provided to patient. Yes  Patient provided with "ticket" for LDCT Scan. yes  Written Order for Lung Cancer  Screening with LDCT placed in Epic. Yes (CT Chest Lung Cancer Screening Low Dose W/O CM) [id number] Z12.2-Screening of respiratory organs Z87.891-Personal history of nicotine dependence  I have spent 25 minutes of face to face time with Tammy Mcpherson discussing the risks and benefits of lung cancer screening. We viewed a power point together that explained in detail the above noted topics. We paused at intervals to allow for questions to be asked and answered to ensure understanding.We discussed that the single most powerful action that she can take to decrease her risk of developing lung cancer is to quit smoking. We discussed whether or not she is ready to commit to setting a quit date. We discussed options for tools to aid in quitting smoking including nicotine replacement therapy, non-nicotine medications, support groups, Quit Smart classes, and behavior modification. We discussed that often times setting smaller, more achievable goals, such as eliminating 1 cigarette a day for a week and then 2 cigarettes a day for a week can be helpful in slowly decreasing the number of cigarettes smoked. This allows for a sense of accomplishment as well as providing a clinical benefit. I gave her the " Be Stronger Than Your Excuses" card with contact information for community resources, classes, free nicotine replacement therapy, and access to mobile apps, text messaging, and on-line smoking cessation help. I have also given her my card and contact information in the event she needs to contact me. We discussed the time and location of the scan, and that either Doroteo Glassman RN or I will call with the results within 24-48 hours of receiving them. I have offered her  a copy of the power point we viewed  as a resource in the event they need reinforcement of  the concepts we discussed today in the office. The patient verbalized understanding of all of  the above and had no further questions upon leaving the office. They have my  contact information in the event they have any further questions.  I spent 4 minutes counseling on smoking cessation and the health risks of continued tobacco abuse.  I explained to the patient that there has been a high incidence of coronary artery disease noted on these exams. I explained that this is a non-gated exam therefore degree or severity cannot be determined. This patient is currently on statin therapy. I have asked the patient to follow-up with their PCP regarding any incidental finding of coronary artery disease and management with diet or medication as their PCP  feels is clinically indicated. The patient verbalized understanding of the above and had no further questions upon completion of the visit.      Tammy Spatz, NP 12/04/2018 5:45 PM

## 2018-12-05 ENCOUNTER — Telehealth: Payer: Self-pay | Admitting: Acute Care

## 2018-12-05 ENCOUNTER — Other Ambulatory Visit: Payer: Self-pay | Admitting: Acute Care

## 2018-12-05 DIAGNOSIS — Z122 Encounter for screening for malignant neoplasm of respiratory organs: Secondary | ICD-10-CM

## 2018-12-05 DIAGNOSIS — F17210 Nicotine dependence, cigarettes, uncomplicated: Principal | ICD-10-CM

## 2018-12-05 DIAGNOSIS — Z87891 Personal history of nicotine dependence: Secondary | ICD-10-CM

## 2018-12-05 NOTE — Telephone Encounter (Signed)
Called and spoke with patient, she is requesting a call back from [facility] to answer some more questions and have her results faxed over.   [name] please advise thank you.

## 2018-12-09 NOTE — Telephone Encounter (Signed)
Spoke with pt and advised that I faxed results to Dr Domenic Polite.  Pt wanted to scheduled f/u with Dr [name] or [name].  Appt with Dr [name] 12/18/18 1:45.  Pt verbalized understanding.  Nothing further needed.

## 2018-12-09 NOTE — Telephone Encounter (Signed)
[place] x 1 for pt - CT results faxed to Dr Domenic Polite per pt request.

## 2018-12-09 NOTE — Telephone Encounter (Signed)
CT results reviewed.  Incidental mention of coronary atherosclerosis in the LAD and left circumflex.  This is not a new finding, she had description of coronary artery calcifications by prior chest CT in 2017 at which point she had a Myoview study that showed no perfusion defects.  Unless she is having any angina symptoms now that would require follow-up testing, would likely continue with medical therapy which now includes aspirin and statin.

## 2018-12-10 ENCOUNTER — Inpatient Hospital Stay (HOSPITAL_COMMUNITY): Payer: PPO | Attending: Oncology

## 2018-12-10 ENCOUNTER — Encounter (HOSPITAL_COMMUNITY): Payer: Self-pay

## 2018-12-10 ENCOUNTER — Other Ambulatory Visit: Payer: Self-pay

## 2018-12-10 VITALS — BP 153/67 | HR 77 | Temp 97.7°F | Resp 18

## 2018-12-10 DIAGNOSIS — D508 Other iron deficiency anemias: Secondary | ICD-10-CM | POA: Insufficient documentation

## 2018-12-10 MED ORDER — CYANOCOBALAMIN 1000 MCG/ML IJ SOLN
INTRAMUSCULAR | Status: AC
  Filled 2018-12-10: qty 1

## 2018-12-10 MED ORDER — CYANOCOBALAMIN 1000 MCG/ML IJ SOLN
1000.0000 ug | Freq: Once | INTRAMUSCULAR | Status: AC
  Administered 2018-12-10: 1000 ug via INTRAMUSCULAR

## 2018-12-10 NOTE — Progress Notes (Signed)
Tammy Mcpherson tolerated Vit B12 injection well without complaints or incident. VSS Pt discharged self ambulatory in satisfactory condition accompanied by her husband

## 2018-12-10 NOTE — Patient Instructions (Signed)
[facility] at [facility] Discharge Instructions  Received Vit B12 injection today. Follow-up as scheduled. Call clinic for any questions or concerns   Thank you for choosing [facility] at [facility] to provide your oncology and hematology care.  To afford each patient quality time with our provider, please arrive at least 15 minutes before your scheduled appointment time.   If you have a lab appointment with the Cancer Center please come in thru the  Main Entrance and check in at the main information desk  You need to re-schedule your appointment should you arrive 10 or more minutes late.  We strive to give you quality time with our providers, and arriving late affects you and other patients whose appointments are after yours.  Also, if you no show three or more times for appointments you may be dismissed from the clinic at the providers discretion.     Again, thank you for choosing [facility].  Our hope is that these requests will decrease the amount of time that you wait before being seen by our physicians.       _____________________________________________________________  Should you have questions after your visit to [facility], please contact our office at [phone] between the hours of 8:00 a.m. and 4:30 p.m.  Voicemails left after 4:00 p.m. will not be returned until the following business day.  For prescription refill requests, have your pharmacy contact our office and allow 72 hours.    Cancer Center Support Programs:   > Cancer Support Group  2nd Tuesday of the month 1pm-2pm, Journey Room   

## 2018-12-17 ENCOUNTER — Telehealth: Payer: Self-pay | Admitting: *Deleted

## 2018-12-17 NOTE — Telephone Encounter (Signed)
-----   Message from Satira Sark, MD sent at 12/09/2018  9:25 AM EDT -----   ----- Message ----- From: Christie Beckers, RN Sent: 12/09/2018   9:06 AM EDT To: Satira Sark, MD

## 2018-12-17 NOTE — Telephone Encounter (Signed)
Per Domenic Polite: CT results reviewed.  Incidental mention of coronary atherosclerosis in the LAD and left circumflex.  This is not a new finding, she had description of coronary artery calcifications by prior chest CT in 2017 at which point she had a Myoview study that showed no perfusion defects.  Unless she is having any angina symptoms now that would require follow-up testing, would likely continue with medical therapy which now includes aspirin and statin

## 2018-12-18 ENCOUNTER — Encounter: Payer: Self-pay | Admitting: Nurse Practitioner

## 2018-12-18 ENCOUNTER — Other Ambulatory Visit: Payer: Self-pay

## 2018-12-18 ENCOUNTER — Ambulatory Visit (INDEPENDENT_AMBULATORY_CARE_PROVIDER_SITE_OTHER): Payer: PPO | Admitting: Nurse Practitioner

## 2018-12-18 DIAGNOSIS — Z720 Tobacco use: Secondary | ICD-10-CM | POA: Diagnosis not present

## 2018-12-18 DIAGNOSIS — J449 Chronic obstructive pulmonary disease, unspecified: Secondary | ICD-10-CM | POA: Diagnosis not present

## 2018-12-18 MED ORDER — TIOTROPIUM BROMIDE MONOHYDRATE 1.25 MCG/ACT IN AERS: 2.0000 | INHALATION_SPRAY | Freq: Every day | RESPIRATORY_TRACT | 3 refills | Status: DC

## 2018-12-18 MED ORDER — BUDESONIDE-FORMOTEROL FUMARATE 160-4.5 MCG/ACT IN AERO: 2.0000 | INHALATION_SPRAY | Freq: Two times a day (BID) | RESPIRATORY_TRACT | 0 refills | Status: DC

## 2018-12-18 NOTE — Patient Instructions (Addendum)
Continue to cut back on smoking - please quit before next visit  Continue Symbicort 160 >>> 2 puffs in the morning right when you wake up, rinse out your mouth after use, 12 hours later 2 puffs, rinse after use >>> Take this daily, no matter what >>> This is not a rescue inhaler   Spiriva Respimat 2.5 >>> 2 puffs daily >>> Do this every day >>>This is not a rescue inhaler  Albuterol every 4-6 hours as needed for shortness of breath or wheezing    Note your daily symptoms >remember "red flags" for COPD:  >>>Increase in cough >>>increase in sputum production >>>increase in shortness of breath or activity  intolerance.   If you notice these symptoms, please call the office to be seen.    We recommend that you stop smoking.  >>>You need to set a quit date >>>If you have friends or family who smoke, let them know you are trying to quit and not to smoke around you or in your living environment  Smoking Cessation Resources:  1 800 QUIT NOW  >>> Patient to call this resource and utilize it to help support her quit smoking >>> Keep up your hard work with stopping smoking  You can also contact the [facility] >>>For smoking cessation classes call [phone]  We do not recommend using e-cigarettes as a form of stopping smoking  You can sign up for smoking cessation support texts and information:  >>>https://smokefree.gov/smokefreetxt  Follow up with Dr. [name] in 3 months or sooner if needed

## 2018-12-18 NOTE — Progress Notes (Signed)
@Patient  ID: Tammy Mcpherson, adult    DOB: Feb 17, 1956, 63 y.o.   MRN: [id number]  No chief complaint on file.   Referring provider: [name], PA*  HPI  63 year old female current smoker with COPD who is followed by Dr. [name].  Tests: CT Chest 12/19/18 - Lung-RADS 1, negative. Continue annual screening with low-dose chest CT without contrast in 12 months. Aortic Atherosclerosis  10/08/2018-CBC with differential-eosinophils relative 1, eosinophils absolute 0.2  04/30/2016-CT angios chest- cardiomegaly with interstitial pulmonary edema very small bilateral pleural effusions, no pulmonary emboli identified  03/20/2018-echocardiogram-LV ejection fraction 65 to 34%, grade 1 diastolic dysfunction, mild LVH  PFT Results Latest Ref Rng & Units 11/01/2018  FVC-Predicted Pre % 76  FVC-Post L 2.07  FVC-Predicted Post % 65  Pre FEV1/FVC % % 65  Post FEV1/FCV % % 74  FEV1-Pre L 1.57  FEV1-Predicted Pre % 64  FEV1-Post L 1.52  DLCO UNC% % 51  DLCO COR %Predicted % 65  TLC L 4.12  TLC % Predicted % 84  RV % Predicted % 90    OV 12/19/18 - Follow up Patient presents today for follow-up visit.  Patient was last seen by Judson Roch gross on 12/04/2018 for lung cancer screening.  She was prescribed Chantix by [name] on 11/01/2018 but did not take the medication.  She has been trying to quit smoking on her own.  She states that she is now down to 6 cigarettes a day and will continue to wean herself off.  She continues on Symbicort and Spiriva.  She states that these inhalers are working well for her.  She does use her albuterol neb treatments as needed. Denies f/c/s, n/v/d, hemoptysis, PND, leg swelling.   Allergies  Allergen Reactions  . Bee Venom Swelling  . Codeine Anaphylaxis    Tongue swelled  . Penicillins Swelling and Rash    Has patient had a PCN reaction causing immediate rash, facial/tongue/throat swelling, SOB or lightheadedness with hypotension: No, delayed Has patient had a  PCN reaction causing severe rash involving mucus membranes or skin necrosis: No Has patient had a PCN reaction that required hospitalization No Has patient had a PCN reaction occurring within the last 10 years: No If all of the above answers are "NO", then may proceed with Cephalosporin use.   . Bupropion Other (See Comments)    "Made my skin crawl"  . Sudafed [Pseudoephedrine Hcl] Rash    Immunization History  Administered Date(s) Administered  . Influenza,inj,Quad PF,6+ Mos 07/04/2017, 07/10/2018  . Pneumococcal Polysaccharide-23 11/01/2018    Past Medical History:  Diagnosis Date  . Anemia 04/30/2016  . Anxiety   . Blood transfusion without reported diagnosis   . Cardiomyopathy ([place])    a. EF 40-45% by echo in 04/2016 b. Improved to 60-65% by repeat imaging in 2018  . CHF (congestive heart failure) ([place])   . COPD (chronic obstructive pulmonary disease) ([place])   . Coronary artery calcification seen on CT scan   . Essential hypertension   . GERD (gastroesophageal reflux disease)   . History of bronchitis   . History of GI bleed   . Hyperlipidemia   . Iron deficiency anemia 05/12/2016   Bleeding ulcers  . Pollen allergy   . PSVT (paroxysmal supraventricular tachycardia) ([place])   . PVC's (premature ventricular contractions)     Tobacco History: Social History   Tobacco Use  Smoking Status Current Every Day Smoker  . Packs/day: 1.00  . Years: 44.00  . Pack  years: 44.00  . Types: Cigarettes  . Start date: 05/10/1975  Smokeless Tobacco Never Used  Tobacco Comment   pack per day 1.15.20   Ready to quit: Not Answered Counseling given: Not Answered Comment: pack per day 1.15.20   Outpatient Encounter Medications as of 12/18/2018  Medication Sig  . acetaminophen (TYLENOL) 500 MG tablet Take 500 mg by mouth at bedtime as needed for moderate pain.  Marland Kitchen albuterol (PROAIR HFA) 108 (90 Base) MCG/ACT inhaler INHALE 2 PUFFS INTO THE LUNGS EVERY 6 HOURS AS NEEDED FOR WHEEZING OR  SHORTNESS OF BREATH.  Marland Kitchen albuterol (PROVENTIL) (2.5 MG/3ML) 0.083% nebulizer solution Take 3 mLs (2.5 mg total) by nebulization every 4 (four) hours as needed for wheezing or shortness of breath.  Marland Kitchen aspirin 81 MG chewable tablet Chew 81 mg by mouth daily.  Marland Kitchen atorvastatin (LIPITOR) 20 MG tablet Take 1 tablet (20 mg total) by mouth daily.  . bisoprolol (ZEBETA) 10 MG tablet Take 1 tablet (10 mg total) by mouth daily.  . budesonide-formoterol (SYMBICORT) 160-4.5 MCG/ACT inhaler Inhale 2 puffs into the lungs every 12 (twelve) hours.  . cholecalciferol (VITAMIN D) 1000 units tablet Take 1,000 Units by mouth daily.   Marland Kitchen losartan (COZAAR) 100 MG tablet TAKE ONE (1) TABLET BY MOUTH EVERY DAY  . Multiple Vitamin (MULTIVITAMIN WITH MINERALS) TABS tablet Take 1 tablet by mouth daily.  . pantoprazole (PROTONIX) 40 MG tablet TAKE ONE (1) TABLET BY MOUTH EVERY DAY  . Tiotropium Bromide Monohydrate (SPIRIVA RESPIMAT) 1.25 MCG/ACT AERS Inhale 2 puffs into the lungs daily.  . [DISCONTINUED] budesonide-formoterol (SYMBICORT) 160-4.5 MCG/ACT inhaler Inhale 2 puffs into the lungs every 12 (twelve) hours.  . [DISCONTINUED] SPIRIVA RESPIMAT 1.25 MCG/ACT AERS INHALE TWO PUFFS INTO THE LUNGS DAILY  . [DISCONTINUED] Tiotropium Bromide Monohydrate (SPIRIVA RESPIMAT) 2.5 MCG/ACT AERS Inhale 2 puffs into the lungs daily.  . varenicline (CHANTIX PAK) 0.5 MG X 11 & 1 MG X 42 tablet Take one 0.5 mg tablet by mouth once daily for 3 days, then increase to one 0.5 mg tablet twice daily for 4 days, then increase to one 1 mg tablet twice daily. (Patient not taking: Reported on 12/18/2018)   No facility-administered encounter medications on file as of 12/18/2018.      Review of Systems  Review of Systems  Constitutional: Negative.  Negative for chills and fever.  HENT: Negative.   Respiratory: Negative for cough, shortness of breath and wheezing.   Cardiovascular: Negative.  Negative for chest pain, palpitations and leg swelling.   Gastrointestinal: Negative.   Allergic/Immunologic: Negative.   Neurological: Negative.   Psychiatric/Behavioral: Negative.        Physical Exam  BP (!) 158/88 (BP Location: Left Arm, Patient Position: Sitting, Cuff Size: Normal)   Pulse 79   Ht 5\' 4"  (1.626 m)   Wt 161 lb 3.2 oz (73.1 kg)   SpO2 98%   BMI 27.67 kg/m   Wt Readings from Last 5 Encounters:  12/18/18 161 lb 3.2 oz (73.1 kg)  12/04/18 163 lb 6.4 oz (74.1 kg)  11/20/18 163 lb 12.8 oz (74.3 kg)  11/01/18 163 lb (73.9 kg)  10/16/18 161 lb (73 kg)     Physical Exam Vitals signs and nursing note reviewed.  Constitutional:      General: She is not in acute distress.    Appearance: She is well-developed.  Cardiovascular:     Rate and Rhythm: Normal rate and regular rhythm.  Pulmonary:     Effort: Pulmonary effort is  normal. No respiratory distress.     Breath sounds: Normal breath sounds. No wheezing or rhonchi.  Skin:    General: Skin is warm and dry.  Neurological:     Mental Status: She is alert and oriented to person, place, and time.     Imaging: Ct Chest Lung Ca Screen Low Dose W/o Cm  Result Date: 12/04/2018 CLINICAL DATA:  62 year old female current smoker, with 35 pack-year history of smoking, for initial lung cancer screening EXAM: CT CHEST WITHOUT CONTRAST LOW-DOSE FOR LUNG CANCER SCREENING TECHNIQUE: Multidetector CT imaging of the chest was performed following the standard protocol without IV contrast. COMPARISON:  CTA chest dated 04/30/2016 FINDINGS: Cardiovascular: Heart is normal in size.  No pericardial effusion. No evidence of thoracic aortic aneurysm. Atherosclerotic calcifications of the aortic arch. Coronary atherosclerosis the LAD and left circumflex. Mediastinum/Nodes: No suspicious mediastinal lymphadenopathy. Visualized thyroid is unremarkable. Lungs/Pleura: Biapical pleural-parenchymal scarring. Mild centrilobular emphysematous changes. No focal consolidation. No suspicious pulmonary  nodules. No pleural effusion or pneumothorax. Upper Abdomen: Visualized upper abdomen is notable for a mildly nodular hepatic contour. Musculoskeletal: Mild degenerative changes of the visualized thoracolumbar spine. IMPRESSION: Lung-RADS 1, negative. Continue annual screening with low-dose chest CT without contrast in 12 months. Aortic Atherosclerosis (ICD10-I70.0) and Emphysema (ICD10-J43.9). Electronically Signed   By: Julian Hy M.D.   On: 12/04/2018 15:18     Assessment & Plan:   Chronic obstructive pulmonary disease ([place]) Patient presents today for follow-up on smoking cessation.  She is down to 6 cigarettes a day and continues to wean herself down.  He is compliant with Symbicort and Spiriva.  She states that these inhalers are working well for her.  Patient Instructions  Continue to cut back on smoking - please quit before next visit  Continue Symbicort 160 >>> 2 puffs in the morning right when you wake up, rinse out your mouth after use, 12 hours later 2 puffs, rinse after use >>> Take this daily, no matter what >>> This is not a rescue inhaler   Spiriva Respimat 2.5 >>> 2 puffs daily >>> Do this every day >>>This is not a rescue inhaler  Albuterol every 4-6 hours as needed for shortness of breath or wheezing    Note your daily symptoms >remember "red flags" for COPD:  >>>Increase in cough >>>increase in sputum production >>>increase in shortness of breath or activity  intolerance.   If you notice these symptoms, please call the office to be seen.    We recommend that you stop smoking.  >>>You need to set a quit date >>>If you have friends or family who smoke, let them know you are trying to quit and not to smoke around you or in your living environment  Smoking Cessation Resources:  1 800 QUIT NOW  >>> Patient to call this resource and utilize it to help support her quit smoking >>> Keep up your hard work with stopping smoking  You can also  contact the [facility] >>>For smoking cessation classes call [phone]  We do not recommend using e-cigarettes as a form of stopping smoking  You can sign up for smoking cessation support texts and information:  >>>https://smokefree.gov/smokefreetxt  Follow up with Dr. [name] in 3 months or sooner if needed      Tobacco abuse Patient presents today for smoking cessation she is trying to wean herself down.  Patient is currently down to 6 cigarettes/day.  She does not have a quit date set.  She did not want  to do Chantix because she was concerned about side effects.  Patient Instructions  Continue to cut back on smoking - please quit before next visit  Continue Symbicort 160 >>> 2 puffs in the morning right when you wake up, rinse out your mouth after use, 12 hours later 2 puffs, rinse after use >>> Take this daily, no matter what >>> This is not a rescue inhaler   Spiriva Respimat 2.5 >>> 2 puffs daily >>> Do this every day >>>This is not a rescue inhaler  Albuterol every 4-6 hours as needed for shortness of breath or wheezing    Note your daily symptoms >remember "red flags" for COPD:  >>>Increase in cough >>>increase in sputum production >>>increase in shortness of breath or activity  intolerance.   If you notice these symptoms, please call the office to be seen.    We recommend that you stop smoking.  >>>You need to set a quit date >>>If you have friends or family who smoke, let them know you are trying to quit and not to smoke around you or in your living environment  Smoking Cessation Resources:  1 800 QUIT NOW  >>> Patient to call this resource and utilize it to help support her quit smoking >>> Keep up your hard work with stopping smoking  You can also contact the [facility] >>>For smoking cessation classes call [phone]  We do not recommend using e-cigarettes as a form of stopping smoking  You can sign  up for smoking cessation support texts and information:  >>>https://smokefree.gov/smokefreetxt  Follow up with Dr. [name] in 3 months or sooner if needed         Fenton Foy, NP 12/19/2018

## 2018-12-19 ENCOUNTER — Encounter: Payer: Self-pay | Admitting: Nurse Practitioner

## 2018-12-19 NOTE — Assessment & Plan Note (Signed)
Patient presents today for follow-up on smoking cessation.  She is down to 6 cigarettes a day and continues to wean herself down.  He is compliant with Symbicort and Spiriva.  She states that these inhalers are working well for her.  Patient Instructions  Continue to cut back on smoking - please quit before next visit  Continue Symbicort 160 >>> 2 puffs in the morning right when you wake up, rinse out your mouth after use, 12 hours later 2 puffs, rinse after use >>> Take this daily, no matter what >>> This is not a rescue inhaler   Spiriva Respimat 2.5 >>> 2 puffs daily >>> Do this every day >>>This is not a rescue inhaler  Albuterol every 4-6 hours as needed for shortness of breath or wheezing    Note your daily symptoms >remember "red flags" for COPD:  >>>Increase in cough >>>increase in sputum production >>>increase in shortness of breath or activity  intolerance.   If you notice these symptoms, please call the office to be seen.    We recommend that you stop smoking.  >>>You need to set a quit date >>>If you have friends or family who smoke, let them know you are trying to quit and not to smoke around you or in your living environment  Smoking Cessation Resources:  1 800 QUIT NOW  >>> Patient to call this resource and utilize it to help support her quit smoking >>> Keep up your hard work with stopping smoking  You can also contact the [facility] >>>For smoking cessation classes call [phone]  We do not recommend using e-cigarettes as a form of stopping smoking  You can sign up for smoking cessation support texts and information:  >>>https://smokefree.gov/smokefreetxt  Follow up with Dr. [name] in 3 months or sooner if needed

## 2018-12-19 NOTE — Progress Notes (Signed)
PCCM: agree. Thanks for seeing.  Garner Nash, DO [facility] 12/19/2018 12:11 PM

## 2018-12-19 NOTE — Assessment & Plan Note (Signed)
Patient presents today for smoking cessation she is trying to wean herself down.  Patient is currently down to 6 cigarettes/day.  She does not have a quit date set.  She did not want to do Chantix because she was concerned about side effects.  Patient Instructions  Continue to cut back on smoking - please quit before next visit  Continue Symbicort 160 >>> 2 puffs in the morning right when you wake up, rinse out your mouth after use, 12 hours later 2 puffs, rinse after use >>> Take this daily, no matter what >>> This is not a rescue inhaler   Spiriva Respimat 2.5 >>> 2 puffs daily >>> Do this every day >>>This is not a rescue inhaler  Albuterol every 4-6 hours as needed for shortness of breath or wheezing    Note your daily symptoms >remember "red flags" for COPD:  >>>Increase in cough >>>increase in sputum production >>>increase in shortness of breath or activity  intolerance.   If you notice these symptoms, please call the office to be seen.    We recommend that you stop smoking.  >>>You need to set a quit date >>>If you have friends or family who smoke, let them know you are trying to quit and not to smoke around you or in your living environment  Smoking Cessation Resources:  1 800 QUIT NOW  >>> Patient to call this resource and utilize it to help support her quit smoking >>> Keep up your hard work with stopping smoking  You can also contact the [facility] >>>For smoking cessation classes call [phone]  We do not recommend using e-cigarettes as a form of stopping smoking  You can sign up for smoking cessation support texts and information:  >>>https://smokefree.gov/smokefreetxt  Follow up with Dr. [name] in 3 months or sooner if needed

## 2018-12-21 DIAGNOSIS — J449 Chronic obstructive pulmonary disease, unspecified: Secondary | ICD-10-CM | POA: Diagnosis not present

## 2018-12-24 ENCOUNTER — Other Ambulatory Visit (HOSPITAL_COMMUNITY): Payer: Self-pay | Admitting: *Deleted

## 2018-12-24 MED ORDER — MISC. DEVICES MISC: 12 refills | Status: DC

## 2018-12-24 MED ORDER — CYANOCOBALAMIN 1000 MCG/ML IJ SOLN: 1000.0000 ug | INTRAMUSCULAR | 11 refills | Status: DC

## 2018-12-24 NOTE — Progress Notes (Signed)
Patient called and is wanting to start her B-12 injections at home. She states her husband will give them.  I have sent her prescription to the pharmacy and instructed her to get directions from the pharmacist on how to administer the medication.  Her and her husband verbalize understanding.

## 2019-01-03 ENCOUNTER — Encounter: Payer: Self-pay | Admitting: *Deleted

## 2019-01-08 ENCOUNTER — Other Ambulatory Visit: Payer: Self-pay | Admitting: Cardiology

## 2019-01-09 ENCOUNTER — Ambulatory Visit (HOSPITAL_COMMUNITY): Payer: PPO

## 2019-01-21 ENCOUNTER — Encounter (HOSPITAL_COMMUNITY): Payer: Self-pay | Admitting: Emergency Medicine

## 2019-01-21 ENCOUNTER — Telehealth (HOSPITAL_COMMUNITY): Payer: Self-pay | Admitting: *Deleted

## 2019-01-21 ENCOUNTER — Emergency Department (HOSPITAL_COMMUNITY): Payer: PPO

## 2019-01-21 ENCOUNTER — Emergency Department (HOSPITAL_COMMUNITY)
Admission: EM | Admit: 2019-01-21 | Discharge: 2019-01-21 | Disposition: A | Discharge status: Home / Self Care | Payer: PPO | Attending: Emergency Medicine | Admitting: Emergency Medicine

## 2019-01-21 ENCOUNTER — Other Ambulatory Visit (HOSPITAL_COMMUNITY)
Admission: RE | Admit: 2019-01-21 | Discharge: 2019-01-21 | Disposition: A | Discharge status: Home / Self Care | Payer: PPO | Source: Ambulatory Visit | Attending: Gastroenterology | Admitting: Gastroenterology

## 2019-01-21 ENCOUNTER — Other Ambulatory Visit: Payer: Self-pay

## 2019-01-21 ENCOUNTER — Telehealth: Payer: Self-pay | Admitting: Family Medicine

## 2019-01-21 ENCOUNTER — Inpatient Hospital Stay (HOSPITAL_COMMUNITY): Payer: PPO | Attending: Oncology

## 2019-01-21 DIAGNOSIS — Z1159 Encounter for screening for other viral diseases: Secondary | ICD-10-CM

## 2019-01-21 DIAGNOSIS — F17210 Nicotine dependence, cigarettes, uncomplicated: Secondary | ICD-10-CM | POA: Diagnosis not present

## 2019-01-21 DIAGNOSIS — I110 Hypertensive heart disease with heart failure: Secondary | ICD-10-CM | POA: Diagnosis not present

## 2019-01-21 DIAGNOSIS — J449 Chronic obstructive pulmonary disease, unspecified: Secondary | ICD-10-CM | POA: Insufficient documentation

## 2019-01-21 DIAGNOSIS — R51 Headache: Secondary | ICD-10-CM | POA: Diagnosis not present

## 2019-01-21 DIAGNOSIS — R739 Hyperglycemia, unspecified: Secondary | ICD-10-CM | POA: Diagnosis not present

## 2019-01-21 DIAGNOSIS — I5042 Chronic combined systolic (congestive) and diastolic (congestive) heart failure: Secondary | ICD-10-CM | POA: Diagnosis not present

## 2019-01-21 DIAGNOSIS — R05 Cough: Secondary | ICD-10-CM | POA: Diagnosis not present

## 2019-01-21 DIAGNOSIS — Z7982 Long term (current) use of aspirin: Secondary | ICD-10-CM | POA: Diagnosis not present

## 2019-01-21 DIAGNOSIS — Z79899 Other long term (current) drug therapy: Secondary | ICD-10-CM | POA: Diagnosis not present

## 2019-01-21 DIAGNOSIS — I2510 Atherosclerotic heart disease of native coronary artery without angina pectoris: Secondary | ICD-10-CM | POA: Insufficient documentation

## 2019-01-21 DIAGNOSIS — D508 Other iron deficiency anemias: Secondary | ICD-10-CM

## 2019-01-21 LAB — COMPREHENSIVE METABOLIC PANEL
ALT: 22 U/L (ref 0–44)
AST: 17 U/L (ref 15–41)
Albumin: 3.7 g/dL (ref 3.5–5.0)
Alkaline Phosphatase: 133 U/L — ABNORMAL HIGH (ref 38–126)
Anion gap: 12 (ref 5–15)
BUN: 8 mg/dL (ref 8–23)
CO2: 19 mmol/L — ABNORMAL LOW (ref 22–32)
Calcium: 8.9 mg/dL (ref 8.9–10.3)
Chloride: 97 mmol/L — ABNORMAL LOW (ref 98–111)
Creatinine, Ser: 0.79 mg/dL (ref 0.44–1.00)
GFR calc Af Amer: >60 mL/min (ref >60)
GFR calc non Af Amer: >60 mL/min (ref >60)
Glucose, Bld: 552 mg/dL (ref 70–99)
Potassium: 4 mmol/L (ref 3.5–5.1)
Sodium: 128 mmol/L — ABNORMAL LOW (ref 135–145)
Total Bilirubin: 0.5 mg/dL (ref 0.3–1.2)
Total Protein: 7.2 g/dL (ref 6.5–8.1)

## 2019-01-21 LAB — CBC
HCT: 41.4 % (ref 36.0–46.0)
Hemoglobin: 13.7 g/dL (ref 12.0–15.0)
MCH: 31.3 pg (ref 26.0–34.0)
MCHC: 33.1 g/dL (ref 30.0–36.0)
MCV: 94.5 fL (ref 80.0–100.0)
Platelets: 352 10*3/uL (ref 150–400)
RBC: 4.38 MIL/uL (ref 3.87–5.11)
RDW: 13.5 % (ref 11.5–15.5)
WBC: 13.1 10*3/uL — ABNORMAL HIGH (ref 4.0–10.5)
nRBC: 0 % (ref 0.0–0.2)

## 2019-01-21 LAB — CBC WITH DIFFERENTIAL/PLATELET
Abs Immature Granulocytes: 0.08 10*3/uL — ABNORMAL HIGH (ref 0.00–0.07)
Basophils Absolute: 0.1 10*3/uL (ref 0.0–0.1)
Basophils Relative: 1 %
Eosinophils Absolute: 0.1 10*3/uL (ref 0.0–0.5)
Eosinophils Relative: 1 %
HCT: 41.8 % (ref 36.0–46.0)
Hemoglobin: 13.9 g/dL (ref 12.0–15.0)
Immature Granulocytes: 1 %
Lymphocytes Relative: 10 %
Lymphs Abs: 1.3 10*3/uL (ref 0.7–4.0)
MCH: 31.3 pg (ref 26.0–34.0)
MCHC: 33.3 g/dL (ref 30.0–36.0)
MCV: 94.1 fL (ref 80.0–100.0)
Monocytes Absolute: 1.3 10*3/uL — ABNORMAL HIGH (ref 0.1–1.0)
Monocytes Relative: 10 %
Neutro Abs: 10.5 10*3/uL — ABNORMAL HIGH (ref 1.7–7.7)
Neutrophils Relative %: 77 %
Platelets: 346 10*3/uL (ref 150–400)
RBC: 4.44 MIL/uL (ref 3.87–5.11)
RDW: 13.5 % (ref 11.5–15.5)
WBC: 13.4 10*3/uL — ABNORMAL HIGH (ref 4.0–10.5)
nRBC: 0 % (ref 0.0–0.2)

## 2019-01-21 LAB — CBG MONITORING, ED
Glucose-Capillary: 494 mg/dL — ABNORMAL HIGH (ref 70–99)
Glucose-Capillary: 423 mg/dL — ABNORMAL HIGH (ref 70–99)
Glucose-Capillary: 321 mg/dL — ABNORMAL HIGH (ref 70–99)

## 2019-01-21 LAB — BASIC METABOLIC PANEL
Anion gap: 12 (ref 5–15)
BUN: 9 mg/dL (ref 8–23)
CO2: 20 mmol/L — ABNORMAL LOW (ref 22–32)
Calcium: 9.1 mg/dL (ref 8.9–10.3)
Chloride: 99 mmol/L (ref 98–111)
Creatinine, Ser: 0.87 mg/dL (ref 0.44–1.00)
GFR calc Af Amer: >60 mL/min (ref >60)
GFR calc non Af Amer: >60 mL/min (ref >60)
Glucose, Bld: 548 mg/dL (ref 70–99)
Potassium: 4.1 mmol/L (ref 3.5–5.1)
Sodium: 131 mmol/L — ABNORMAL LOW (ref 135–145)

## 2019-01-21 LAB — IRON AND TIBC
Iron: 73 ug/dL (ref 28–170)
Saturation Ratios: 22 % (ref 10.4–31.8)
TIBC: 337 ug/dL (ref 250–450)
UIBC: 264 ug/dL

## 2019-01-21 LAB — URINALYSIS, ROUTINE W REFLEX MICROSCOPIC
Bilirubin Urine: NEGATIVE
Glucose, UA: >=500 mg/dL — AB
Hgb urine dipstick: NEGATIVE
Ketones, ur: NEGATIVE mg/dL
Leukocytes,Ua: NEGATIVE
Nitrite: NEGATIVE
Protein, ur: NEGATIVE mg/dL
Specific Gravity, Urine: 1.029 (ref 1.005–1.030)
pH: 6 (ref 5.0–8.0)

## 2019-01-21 LAB — FERRITIN: Ferritin: 114 ng/mL (ref 11–307)

## 2019-01-21 LAB — VITAMIN B12: Vitamin B-12: 900 pg/mL (ref 180–914)

## 2019-01-21 LAB — LACTATE DEHYDROGENASE: LDH: 121 U/L (ref 98–192)

## 2019-01-21 LAB — FOLATE: Folate: 11.6 ng/mL (ref >5.9)

## 2019-01-21 MED ORDER — SODIUM CHLORIDE 0.9 % IV BOLUS (SEPSIS)
1000.0000 mL | Freq: Once | INTRAVENOUS | Status: AC
  Administered 2019-01-21: 1000 mL via INTRAVENOUS

## 2019-01-21 MED ORDER — INSULIN ASPART 100 UNIT/ML ~~LOC~~ SOLN
10.0000 [IU] | Freq: Once | SUBCUTANEOUS | Status: AC
  Administered 2019-01-21: 10 [IU] via INTRAVENOUS
  Filled 2019-01-21: qty 1

## 2019-01-21 MED ORDER — BLOOD GLUCOSE MONITOR KIT: PACK | 0 refills | Status: DC

## 2019-01-21 MED ORDER — SODIUM CHLORIDE 0.9 % IV SOLN
1000.0000 mL | INTRAVENOUS | Status: DC
  Administered 2019-01-21: 1000 mL via INTRAVENOUS

## 2019-01-21 MED ORDER — METFORMIN HCL 500 MG PO TABS: ORAL_TABLET | ORAL | 0 refills | Status: DC

## 2019-01-21 MED ORDER — SODIUM CHLORIDE 0.9 % IV BOLUS (SEPSIS)
1000.0000 mL | Freq: Once | INTRAVENOUS | Status: AC
  Administered 2019-01-21: 17:00:00 1000 mL via INTRAVENOUS

## 2019-01-21 MED ORDER — SODIUM CHLORIDE 0.9 % IV SOLN: 1000.0000 mL | INTRAVENOUS | Status: DC

## 2019-01-21 NOTE — Discharge Instructions (Signed)
Your initial glucose was over 500.  This most likely indicates that you are no longer prediabetic, but a diabetic patient.  Please utilize the diabetic eating plan in your discharge instructions until you can be seen by your primary physician.  Please call your primary physician tomorrow morning and set up an appointment as soon as possible.  Please use metformin daily at dinner until you are primary physician set you up on a more permanent dosing schedule.  Please increase water.  Please increase your exercise.  Return to the emergency department if any unusual weakness, changes in your vision, changes in your condition, problems, or concerns.

## 2019-01-21 NOTE — Telephone Encounter (Signed)
01/21/2019 - WE RECEIVED A CALL FROM [name]. PATIENT WAS AT THEIR OFFICE AND HAD A BLOOD SUGAR OF 552. THEY WANTED TO KNOW IF SHE SHOULD GO TO THE EMERGENCY ROOM OR COME TO SEE HER PRIMARY CARE PHYSICIAN? SHE IS FORMERLY A [name] PATIENT WHO SHE SAW LAST IN November 2019. I ASKED CHANDA WHO IS OUR CLINICAL RN MANAGER. SHE SAID THE PATIENT NEEDS TO GO TO THE ER AND THAT WE WILL BE HAPPY TO FOLLOW-UP WITH HER. [place]

## 2019-01-21 NOTE — Telephone Encounter (Signed)
CRITICAL VALUE STICKER  CRITICAL VALUE: Glucose 552  RECEIVER (on-site recipient of call): Rhona Fusilier  DATE & TIME NOTIFIED: 1438 01/21/19  MESSENGER (representative from lab): B. Rodman Key lab tech MD NOTIFIED: Dr. Delton Coombes  TIME OF NOTIFICATION:1439  RESPONSE: Reviewed labs with Dr. Delton Coombes with verbal order to send the patient to the ER or PCP. Called pt to verify not diabetic or on any steroid medications. Pt stated she is having excessive thirst, increased urination, and increased appetite. Called the PCP for the pt who instructed [place] to send the pt to the Er due to increased glucose and hasn't been in the office in a year. Also instructed the pt per PCP to call the office to schedule an appointment for high blood sugar. Pt verbalized understanding

## 2019-01-21 NOTE — ED Notes (Signed)
CRITICAL VALUE ALERT  Critical Value:  Glucose 548  Date & Time Notied:  01/21/19, 1724  Provider Notified: Sima Matas  Orders Received/Actions taken: no new orders at this time.

## 2019-01-21 NOTE — Telephone Encounter (Signed)
Called the emergency room to notify of patients lab results and being sent to the ER by her PCP.

## 2019-01-21 NOTE — ED Provider Notes (Signed)
[facility] EMERGENCY DEPARTMENT Provider Note   CSN: [id number] Arrival date & time: 01/21/19  1550    History   Chief Complaint Chief Complaint  Patient presents with  . Hyperglycemia    HPI Tammy Mcpherson is a 63 y.o. adult.     Patient is a 63 year old female who presents to the emergency department with a complaint of hyperglycemia.  The patient states that she had blood work done today because of complaint of headache and increased urine frequency.  The patient received a call from the doctor's office that her glucose was 552, and she was advised to come to the emergency department.  The patient states that she has not had history of previous diabetic related issues.  She does have a history of cardiomyopathy with an ejection fraction of 40 to 45%.  Chronic obstructive pulmonary disease, GERD, bronchitis, stomach ulcers with bleeding, and peripheral vascular disease.  The patient states she has been having increased thirst,  some fatigue, and increased headache recently.  Patient states that last week she had numbness in the tips of the ring finger and little finger on the left hand.  Today she has numbness in the tips of the fingers on the right.  The history is provided by the patient.    Past Medical History:  Diagnosis Date  . Anemia 04/30/2016  . Anxiety   . Blood transfusion without reported diagnosis   . Cardiomyopathy ([place])    a. EF 40-45% by echo in 04/2016 b. Improved to 60-65% by repeat imaging in 2018  . CHF (congestive heart failure) ([place])   . COPD (chronic obstructive pulmonary disease) ([place])   . Coronary artery calcification seen on CT scan   . Essential hypertension   . GERD (gastroesophageal reflux disease)   . History of bronchitis   . History of GI bleed   . Hyperlipidemia   . Iron deficiency anemia 05/12/2016   Bleeding ulcers  . Pollen allergy   . PSVT (paroxysmal supraventricular tachycardia) ([place])   . PVC's (premature ventricular contractions)      Patient Active Problem List   Diagnosis Date Noted  . Need for hepatitis C screening test 11/20/2018  . Prediabetes 03/01/2018  . Diarrhea 02/07/2018  . Lesion of liver   . Gastric AVM   . AVM (arteriovenous malformation) of small bowel, acquired   . Vitamin D deficiency 01/15/2017  . PAD (peripheral artery disease) ([place]) 01/15/2017  . Aortic atherosclerosis ([place]) 01/01/2017  . Gastric ulcer 01/01/2017  . Hyperlipidemia 01/01/2017  . Dyspnea on effort 01/01/2017  . Anemia, iron deficiency 05/12/2016  . Nonischemic cardiomyopathy ([place]) 05/02/2016  . CAD-Ca++ coronaries on CTA 05/02/2016  . Acute combined systolic and diastolic heart failure ([place]) 04/30/2016  . Chronic obstructive pulmonary disease ([place]) 04/30/2016  . Essential hypertension 04/30/2016  . Tobacco abuse 04/30/2016    Past Surgical History:  Procedure Laterality Date  . ABDOMINAL HYSTERECTOMY     polyps  about age 51  . Barbie Banner OSTEOTOMY Right 07/10/2013   Procedure: Barbie Banner OSTEOTOMY RIGHT FOOT;  Surgeon: Marcheta Grammes, DPM;  Location: [facility];  Service: Orthopedics;  Laterality: Right;  . AMPUTATION Left 05/09/2017   Procedure: AMPUTATION 5TH TOE LEFT FOOT;  Surgeon: [name], DPM;  Location: [facility];  Service: Podiatry;  Laterality: Left;  . AMPUTATION Left 06/13/2017   Procedure: AMPUTATION 2ND TOE LEFT FOOT;  Surgeon: [name], DPM;  Location: [facility];  Service: Podiatry;  Laterality: Left;  . BUNIONECTOMY Right 07/10/2013  Procedure: VOGLER BUNIONECTOMY RIGHT FOOT;  Surgeon: Marcheta Grammes, DPM;  Location: [facility];  Service: Orthopedics;  Laterality: Right;  . CHOLECYSTECTOMY N/A 08/22/2017   Procedure: LAPAROSCOPIC CHOLECYSTECTOMY;  Surgeon: Virl Cagey, MD;  Location: [facility];  Service: General;  Laterality: N/A;  . COLONOSCOPY N/A 07/07/2016   Dr. Oneida Alar; redundant left colon, diverticulosis at hepatic flexure, non-bleeding internal hemorrhoids  .  ESOPHAGOGASTRODUODENOSCOPY N/A 07/07/2016   Dr. Oneida Alar: many non-bleeding cratered gastric ulcers without stigmata of bleeding in gastric antrum. four 2-3 mm angioectasias without bleeding in duodenal bulb and second portion of duodenum s/p APC. Chroni gastritis on path.   . ESOPHAGOGASTRODUODENOSCOPY N/A 08/03/2017   Dr. Oneida Alar: erosive gastritis, AVMs. Found a single non-bleeding angioectasia in stomach, s/p APC therapy. Four non-bleeding angioectasias in duodenum s/p APC. Non-bleeding erosive gastropathy  . IR ANGIOGRAM EXTREMITY LEFT  04/24/2017  . IR FEM POP ART ATHERECT INC PTA MOD SED  04/24/2017  . IR INFUSION THROMBOL ARTERIAL INITIAL (MS)  04/24/2017  . IR RADIOLOGIST EVAL & MGMT  12/05/2016  . IR US GUIDE VASC ACCESS RIGHT  04/24/2017  . LIVER BIOPSY N/A 08/22/2017   Procedure: LIVER BIOPSY;  Surgeon: Virl Cagey, MD;  Location: [facility];  Service: General;  Laterality: N/A;  . METATARSAL HEAD EXCISION Right 07/10/2013   Procedure: METATARSAL HEAD RESECTION OF DIGITS 2 AND 3 RIGHT FOOT;  Surgeon: Marcheta Grammes, DPM;  Location: [facility];  Service: Orthopedics;  Laterality: Right;  . PROXIMAL INTERPHALANGEAL FUSION (PIP) Right 07/10/2013   Procedure: ARTHRODESIS PIPJ  2ND DIGIT RIGHT FOOT;  Surgeon: Marcheta Grammes, DPM;  Location: [facility];  Service: Orthopedics;  Laterality: Right;     OB History   No obstetric history on file.      Home Medications    Prior to Admission medications   Medication Sig Start Date End Date Taking? Authorizing Provider  acetaminophen (TYLENOL) 500 MG tablet Take 500 mg by mouth at bedtime as needed for moderate pain.    [provider]  albuterol (PROAIR HFA) 108 (90 Base) MCG/ACT inhaler INHALE 2 PUFFS INTO THE LUNGS EVERY 6 HOURS AS NEEDED FOR WHEEZING OR SHORTNESS OF BREATH. 08/09/18   Tenna Delaine D, PA-C  albuterol (PROVENTIL) (2.5 MG/3ML) 0.083% nebulizer solution Take 3 mLs (2.5 mg total) by nebulization every 4 (four)  hours as needed for wheezing or shortness of breath. 10/16/18   Garner Nash, DO  aspirin 81 MG chewable tablet Chew 81 mg by mouth daily.    [provider]  atorvastatin (LIPITOR) 20 MG tablet TAKE ONE TABLET (20MG TOTAL) BY MOUTH DAILY 01/08/19   Satira Sark, MD  bisoprolol (ZEBETA) 10 MG tablet Take 1 tablet (10 mg total) by mouth daily. 10/10/18   Satira Sark, MD  budesonide-formoterol [facility]) 160-4.5 MCG/ACT inhaler Inhale 2 puffs into the lungs every 12 (twelve) hours. 12/18/18   Fenton Foy, NP  cholecalciferol (VITAMIN D) 1000 units tablet Take 1,000 Units by mouth daily.     [provider]  cyanocobalamin (,VITAMIN B-12,) 1000 MCG/ML injection Inject 1 mL (1,000 mcg total) into the muscle every 30 (thirty) days. 12/24/18   Derek Jack, MD  losartan (COZAAR) 100 MG tablet TAKE ONE (1) TABLET BY MOUTH EVERY DAY 10/10/18   Satira Sark, MD  Misc. Devices MISC Please provide supplies (needle, syringe, alcohol swabs) needed for patient to self-administer B-12 injections monthly. 12/24/18   Derek Jack, MD  Multiple Vitamin (MULTIVITAMIN WITH  MINERALS) TABS tablet Take 1 tablet by mouth daily.    [provider]  pantoprazole (PROTONIX) 40 MG tablet TAKE ONE (1) TABLET BY MOUTH EVERY DAY 11/21/18   Carlis Stable, NP  Tiotropium Bromide Monohydrate (SPIRIVA RESPIMAT) 1.25 MCG/ACT AERS Inhale 2 puffs into the lungs daily. 12/18/18   Fenton Foy, NP  varenicline (CHANTIX PAK) 0.5 MG X 11 & 1 MG X 42 tablet Take one 0.5 mg tablet by mouth once daily for 3 days, then increase to one 0.5 mg tablet twice daily for 4 days, then increase to one 1 mg tablet twice daily. Patient not taking: Reported on 12/18/2018 11/01/18   Lauraine Rinne, NP    Family History Family History  Adopted: Yes  Problem Relation Age of Onset  . Hypertension Father   . Coronary artery disease Sister   . Ulcers Maternal Grandmother   . Colon cancer Neg Hx         unknown, was adopted    Social History Social History   Tobacco Use  . Smoking status: Current Every Day Smoker    Packs/day: 1.00    Years: 44.00    Pack years: 44.00    Types: Cigarettes    Start date: 05/10/1975  . Smokeless tobacco: Never Used  . Tobacco comment: pack per day 1.15.20  Substance Use Topics  . Alcohol use: Yes    Alcohol/week: 2.0 standard drinks    Types: 2 Glasses of wine per week  . Drug use: No     Allergies   Bee venom; Codeine; Penicillins; Bupropion; and Sudafed [pseudoephedrine hcl]   Review of Systems Review of Systems  Constitutional: Positive for fatigue. Negative for activity change.       All ROS Neg except as noted in HPI  HENT: Negative for nosebleeds.   Eyes: Negative for photophobia and discharge.  Respiratory: Negative for cough, shortness of breath and wheezing.   Cardiovascular: Negative for chest pain and palpitations.  Gastrointestinal: Negative for abdominal pain, blood in stool, nausea and vomiting.  Endocrine: Positive for polydipsia.  Genitourinary: Negative for dysuria, frequency and hematuria.  Musculoskeletal: Negative for arthralgias, back pain and neck pain.  Skin: Negative.   Neurological: Positive for headaches. Negative for dizziness, seizures and speech difficulty.  Psychiatric/Behavioral: Negative for confusion and hallucinations.     Physical Exam Updated Vital Signs Pulse 89   Temp 98.3 F (36.8 C) (Oral)   Resp 17   Ht _0  (1.626 m)   Wt 72.6 kg   SpO2 96%   BMI 27.46 kg/m   Physical Exam Vitals signs and nursing note reviewed.  Constitutional:      Appearance: She is well-developed. She is not toxic-appearing.  HENT:     Head: Normocephalic.     Right Ear: Tympanic membrane and external ear normal.     Left Ear: Tympanic membrane and external ear normal.  Eyes:     General: Lids are normal.     Pupils: Pupils are equal, round, and reactive to light.  Neck:     Musculoskeletal: Normal  range of motion and neck supple.     Vascular: No carotid bruit.  Cardiovascular:     Rate and Rhythm: Normal rate and regular rhythm.     Pulses: Normal pulses.     Heart sounds: Normal heart sounds.  Pulmonary:     Effort: No respiratory distress.     Breath sounds: Rhonchi present.  Abdominal:     General:  Bowel sounds are normal.     Palpations: Abdomen is soft.     Tenderness: There is no abdominal tenderness. There is no guarding.  Musculoskeletal: Normal range of motion.  Lymphadenopathy:     Head:     Right side of head: No submandibular adenopathy.     Left side of head: No submandibular adenopathy.     Cervical: No cervical adenopathy.  Skin:    General: Skin is warm and dry.  Neurological:     Mental Status: She is alert and oriented to person, place, and time.     Cranial Nerves: No cranial nerve deficit.     Sensory: No sensory deficit.  Psychiatric:        Speech: Speech normal.      ED Treatments / Results  Labs (all labs ordered are listed, but only abnormal results are displayed) Labs Reviewed  CBC - Abnormal; Notable for the following components:      Result Value   WBC 13.1 (*)    All other components within normal limits  CBG MONITORING, ED - Abnormal; Notable for the following components:   Glucose-Capillary 494 (*)    All other components within normal limits  BASIC METABOLIC PANEL  URINALYSIS, ROUTINE W REFLEX MICROSCOPIC  CBG MONITORING, ED    EKG None  Radiology No results found.  Procedures Procedures (including critical care time)  Medications Ordered in ED Medications  sodium chloride 0.9 % bolus 1,000 mL (has no administration in time range)    Followed by  0.9 %  sodium chloride infusion (has no administration in time range)     Initial Impression / Assessment and Plan / ED Course  I have reviewed the triage vital signs and the nursing notes.  Pertinent labs & imaging results that were available during my care of the  patient were reviewed by me and considered in my medical decision making (see chart for details).          Final Clinical Impressions(s) / ED Diagnoses MDM  Vital signs reviewed.  Pulse oximetry is normal at 96% on room air by my interpretation.  Patient was noted to have an elevation in her blood glucose by outpatient lab evaluation, and was sent to the emergency department  Urine analysis shows a clear straw-colored specimen with a specific gravity of 1.029.  Glucose is elevated at greater than 500.  The urine also shows a budding yeast present.  CBG here in the emergency department elevated of 494.  IV fluids started.  The complete blood count shows the Dimitroff blood cells to be elevated at 13,100, the hemoglobin is 13.7 and hematocrit is 41.4.  The platelets are normal at 352,000.  Critical result for the glucose was noted at 548.  Sodium was 131, the CO2 was slightly low at 20, otherwise within normal limits.  The anion gap is normal at 12.  After a liter of fluid, the CBG is still elevated at 423.  Patient given another liter of fluid as well as 10 units of Humalog IV.  Recheck.  CBG down to 321.  I asked the patient to allow [place] to give an additional dose of insulin and observe her here in the emergency department.  The patient states that she is ready to go home, and she is not sure that her ride can stay any longer.  Prescription for metformin 500 mg given to the patient to use.  A prescription also for blood glucose meter kit was given  to the patient.  I have strongly advised the patient to see her primary physician tomorrow, or as soon as possible.  I have also given the patient instructions on diet and exercise to use until she is seen by her doctor.  The patient is advised to return to the emergency department immediately if any vision changes, increased thirst, unusual weakness, changes in her condition, problems, or concerns.   Final diagnoses:  Hyperglycemia    ED Discharge  Orders         Ordered    metFORMIN (GLUCOPHAGE) 500 MG tablet     01/21/19 1959    blood glucose meter kit and supplies KIT     01/21/19 2014           Lily Kocher, PA-C 01/22/19 [place], Ankit, MD 01/23/19 [phone]

## 2019-01-21 NOTE — ED Triage Notes (Signed)
Pt hd blood work today and was told her glucose 552. Pt denies being a diabetic but states increased urinary frequency and headache

## 2019-01-22 ENCOUNTER — Ambulatory Visit (HOSPITAL_COMMUNITY): Payer: PPO | Admitting: Nurse Practitioner

## 2019-01-22 LAB — HEPATITIS C ANTIBODY: HCV Ab: <0.1 s/co ratio (ref 0.0–0.9)

## 2019-01-22 LAB — HEMOGLOBIN A1C
Hgb A1c MFr Bld: 11.8 % — ABNORMAL HIGH (ref 4.8–5.6)
Mean Plasma Glucose: 291.96 mg/dL

## 2019-01-22 NOTE — Progress Notes (Signed)
Pt called office and was informed.

## 2019-01-23 NOTE — Progress Notes (Signed)
Yes I saw it. Thank you very much.

## 2019-01-24 ENCOUNTER — Ambulatory Visit (INDEPENDENT_AMBULATORY_CARE_PROVIDER_SITE_OTHER): Payer: PPO | Admitting: Family Medicine

## 2019-01-24 ENCOUNTER — Encounter: Payer: Self-pay | Admitting: Family Medicine

## 2019-01-24 VITALS — Ht 64.0 in | Wt 158.0 lb

## 2019-01-24 DIAGNOSIS — Z7689 Persons encountering health services in other specified circumstances: Secondary | ICD-10-CM | POA: Diagnosis not present

## 2019-01-24 DIAGNOSIS — E119 Type 2 diabetes mellitus without complications: Secondary | ICD-10-CM | POA: Diagnosis not present

## 2019-01-24 MED ORDER — FREESTYLE SYSTEM KIT: PACK | 0 refills | Status: DC

## 2019-01-24 MED ORDER — METFORMIN HCL 500 MG PO TABS: 500.0000 mg | ORAL_TABLET | Freq: Two times a day (BID) | ORAL | 3 refills | Status: DC

## 2019-01-24 NOTE — Progress Notes (Signed)
Virtual Visit via Video Note  I connected with Tammy Mcpherson on 01/24/19 at  2:00 PM EDT by a video enabled telemedicine application and verified that I am speaking with the correct person using two identifiers. Location patient: home Location provider: work Persons participating in the virtual visit: patient, provider  I discussed the limitations of evaluation and management by telemedicine and the availability of in person appointments. The patient expressed understanding and agreed to proceed.  Chief Complaint  Patient presents with  . Establish Care    est care/hosp f/u/ hasn't checked BP sugar since hospital visit/ hasn't started taking metformin      HPI: Tammy Mcpherson is a 63 y.o. adult to establish care with our office and to f/u on newly diagnosed DM. She had labs done as an outpatient and was told to go to ER when BS was found to be >500. A1C = 11.8.  She was discharged with Rx for metformin 527m daily. She is already on ASA 890mdaily, lipitor 2068maily, and losartan 100m63mily. Pt was given Rx for glucometer per chart but pt states she did not receive this. She has not started taking her metformin because she didn't want to do that without being able to check her BS.  Pt is not very physically active. She is not very conscious of her diet.  Pt has a h/o  CHF, COPD, HTN, CAD, hyperlipidemia.  Past Medical History:  Diagnosis Date  . Anemia 04/30/2016  . Anxiety   . Blood transfusion without reported diagnosis   . Cardiomyopathy (HCC)[place] a. EF 40-45% by echo in 04/2016 b. Improved to 60-65% by repeat imaging in 2018  . CHF (congestive heart failure) (HCC)[place]. COPD (chronic obstructive pulmonary disease) (HCC)[place]. Coronary artery calcification seen on CT scan   . Essential hypertension   . GERD (gastroesophageal reflux disease)   . History of bronchitis   . History of GI bleed   . Hyperlipidemia   . Iron deficiency anemia 05/12/2016   Bleeding ulcers  . Pollen allergy    . PSVT (paroxysmal supraventricular tachycardia) (HCC)[place]. PVC's (premature ventricular contractions)     Past Surgical History:  Procedure Laterality Date  . ABDOMINAL HYSTERECTOMY     polyps  about age 11  61AIKEBarbie BannerEOTOMY Right 07/10/2013   Procedure: AIKEBarbie BannerEOTOMY RIGHT FOOT;  Surgeon: BenjMarcheta GrammesM;  Location: [facility];  Service: Orthopedics;  Laterality: Right;  . AMPUTATION Left 05/09/2017   Procedure: AMPUTATION 5TH TOE LEFT FOOT;  Surgeon: McKiCaprice BeaverM;  Location: [facility];  Service: Podiatry;  Laterality: Left;  . AMPUTATION Left 06/13/2017   Procedure: AMPUTATION 2ND TOE LEFT FOOT;  Surgeon: McKiCaprice BeaverM;  Location: [facility];  Service: Podiatry;  Laterality: Left;  . BUNIONECTOMY Right 07/10/2013   Procedure: VOGLER BUNIONECTOMY RIGHT FOOT;  Surgeon: BenjMarcheta GrammesM;  Location: [facility];  Service: Orthopedics;  Laterality: Right;  . CHOLECYSTECTOMY N/A 08/22/2017   Procedure: LAPAROSCOPIC CHOLECYSTECTOMY;  Surgeon: BridVirl Cagey;  Location: [facility];  Service: General;  Laterality: N/A;  . COLONOSCOPY N/A 07/07/2016   Dr. FielOneida Alardundant left colon, diverticulosis at hepatic flexure, non-bleeding internal hemorrhoids  . ESOPHAGOGASTRODUODENOSCOPY N/A 07/07/2016   Dr. FielOneida Alarny non-bleeding cratered gastric ulcers without stigmata of bleeding in gastric antrum. four 2-3 mm angioectasias without bleeding in duodenal bulb and second portion of duodenum s/p APC. Chroni gastritis on path.   .Marland Kitchen  ESOPHAGOGASTRODUODENOSCOPY N/A 08/03/2017   Dr. Oneida Alar: erosive gastritis, AVMs. Found a single non-bleeding angioectasia in stomach, s/p APC therapy. Four non-bleeding angioectasias in duodenum s/p APC. Non-bleeding erosive gastropathy  . IR ANGIOGRAM EXTREMITY LEFT  04/24/2017  . IR FEM POP ART ATHERECT INC PTA MOD SED  04/24/2017  . IR INFUSION THROMBOL ARTERIAL INITIAL (MS)  04/24/2017  . IR RADIOLOGIST EVAL & MGMT  12/05/2016  . IR US GUIDE VASC  ACCESS RIGHT  04/24/2017  . LIVER BIOPSY N/A 08/22/2017   Procedure: LIVER BIOPSY;  Surgeon: Virl Cagey, MD;  Location: [facility];  Service: General;  Laterality: N/A;  . METATARSAL HEAD EXCISION Right 07/10/2013   Procedure: METATARSAL HEAD RESECTION OF DIGITS 2 AND 3 RIGHT FOOT;  Surgeon: Marcheta Grammes, DPM;  Location: [facility];  Service: Orthopedics;  Laterality: Right;  . PROXIMAL INTERPHALANGEAL FUSION (PIP) Right 07/10/2013   Procedure: ARTHRODESIS PIPJ  2ND DIGIT RIGHT FOOT;  Surgeon: Marcheta Grammes, DPM;  Location: [facility];  Service: Orthopedics;  Laterality: Right;    Family History  Adopted: Yes  Problem Relation Age of Onset  . Hypertension Father   . Coronary artery disease Sister   . Ulcers Maternal Grandmother   . Colon cancer Neg Hx        unknown, was adopted    Social History   Tobacco Use  . Smoking status: Current Every Day Smoker    Packs/day: 1.00    Years: 44.00    Pack years: 44.00    Types: Cigarettes    Start date: 05/10/1975  . Smokeless tobacco: Never Used  . Tobacco comment: pack per day 1.15.20  Substance Use Topics  . Alcohol use: Yes    Alcohol/week: 2.0 standard drinks    Types: 2 Glasses of wine per week  . Drug use: No     Current Outpatient Medications:  .  acetaminophen (TYLENOL) 500 MG tablet, Take 500 mg by mouth at bedtime as needed for moderate pain., Disp: , Rfl:  .  albuterol (PROVENTIL) (2.5 MG/3ML) 0.083% nebulizer solution, Take 3 mLs (2.5 mg total) by nebulization every 4 (four) hours as needed for wheezing or shortness of breath., Disp: 75 mL, Rfl: 5 .  aspirin 81 MG chewable tablet, Chew 81 mg by mouth every morning. , Disp: , Rfl:  .  atorvastatin (LIPITOR) 20 MG tablet, TAKE ONE TABLET (20MG TOTAL) BY MOUTH DAILY (Patient taking differently: Take 20 mg by mouth every morning. ), Disp: 90 tablet, Rfl: 3 .  bisoprolol (ZEBETA) 10 MG tablet, Take 1 tablet (10 mg total) by mouth daily., Disp: 90 tablet, Rfl: 3 .   losartan (COZAAR) 100 MG tablet, TAKE ONE (1) TABLET BY MOUTH EVERY DAY (Patient taking differently: Take 100 mg by mouth every morning. TAKE ONE (1) TABLET BY MOUTH EVERY DAY), Disp: 90 tablet, Rfl: 1 .  pantoprazole (PROTONIX) 40 MG tablet, TAKE ONE (1) TABLET BY MOUTH EVERY DAY (Patient taking differently: Take 40 mg by mouth every morning. TAKE ONE (1) TABLET BY MOUTH EVERY DAY), Disp: 90 tablet, Rfl: 3 .  Tiotropium Bromide Monohydrate (SPIRIVA RESPIMAT) 1.25 MCG/ACT AERS, Inhale 2 puffs into the lungs daily. (Patient taking differently: Inhale 1 puff into the lungs 2 (two) times a day. ), Disp: 4 g, Rfl: 3 .  albuterol (PROAIR HFA) 108 (90 Base) MCG/ACT inhaler, INHALE 2 PUFFS INTO THE LUNGS EVERY 6 HOURS AS NEEDED FOR WHEEZING OR SHORTNESS OF BREATH. (Patient not taking: Reported on 01/24/2019), Disp: 18  g, Rfl: 3 .  budesonide-formoterol (SYMBICORT) 160-4.5 MCG/ACT inhaler, Inhale 2 puffs into the lungs every 12 (twelve) hours. (Patient taking differently: Inhale 2 puffs into the lungs 2 (two) times daily as needed (for shortness of breath). ), Disp: 1 Inhaler, Rfl: 0 .  cyanocobalamin (,VITAMIN B-12,) 1000 MCG/ML injection, Inject 1 mL (1,000 mcg total) into the muscle every 30 (thirty) days., Disp: 1 mL, Rfl: 11 .  glucose monitoring kit (FREESTYLE) monitoring kit, Use to check blood sugar BID as directed, Disp: 1 each, Rfl: 0 .  metFORMIN (GLUCOPHAGE) 500 MG tablet, Take 1 tablet (500 mg total) by mouth 2 (two) times daily with a meal., Disp: 180 tablet, Rfl: 3 .  Misc. Devices MISC, Please provide supplies (needle, syringe, alcohol swabs) needed for patient to self-administer B-12 injections monthly., Disp: 1 each, Rfl: 12 .  varenicline (CHANTIX PAK) 0.5 MG X 11 & 1 MG X 42 tablet, Take one 0.5 mg tablet by mouth once daily for 3 days, then increase to one 0.5 mg tablet twice daily for 4 days, then increase to one 1 mg tablet twice daily. (Patient not taking: Reported on 12/18/2018), Disp: 53  tablet, Rfl: 0  Allergies  Allergen Reactions  . Bee Venom Swelling  . Codeine Anaphylaxis    Tongue swelled  . Penicillins Swelling and Rash    Has patient had a PCN reaction causing immediate rash, facial/tongue/throat swelling, SOB or lightheadedness with hypotension: No, delayed Has patient had a PCN reaction causing severe rash involving mucus membranes or skin necrosis: No Has patient had a PCN reaction that required hospitalization No Has patient had a PCN reaction occurring within the last 10 years: No If all of the above answers are "NO", then may proceed with Cephalosporin use.   . Bupropion Other (See Comments)    "Made my skin crawl"  . Sudafed [Pseudoephedrine Hcl] Rash      ROS: See pertinent positives and negatives per HPI.   EXAM:  VITALS per patient if applicable:  GENERAL: alert, oriented, appears well and in no acute distress  HEENT: atraumatic, conjunctiva clear, no obvious abnormalities on inspection of external nose and ears  NECK: normal movements of the head and neck  LUNGS: on inspection no signs of respiratory distress, breathing rate appears normal, no obvious gross SOB, gasping or wheezing, no conversational dyspnea  CV: no obvious cyanosis  PSYCH/NEURO: pleasant and cooperative, speech and thought processing grossly intact   ASSESSMENT AND PLAN: 1. Encounter to establish care with new doctor 2. Newly diagnosed diabetes ([place]) - A1C = 11.8 (previously 5.7 in 01/2018) Rx: - glucose monitoring kit (FREESTYLE) monitoring kit; Use to check blood sugar BID as directed  Dispense: 1 each; Refill: 0 - pt will check BS BID (fasting and post-prandial) and keep log for f/u visit in 2 mo Rx: - metFORMIN (GLUCOPHAGE) 500 MG tablet; Take 1 tablet (500 mg total) by mouth 2 (two) times daily with a meal.  Dispense: 180 tablet; Refill: 3 - cont with ASA 38m daily, statin, ARB daily - pt declines nutrition referral - stressed importance of regular  exercise/walking in order to help lower BS - f/u in 2 mo or sooner PRN   I discussed the assessment and treatment plan with the patient. The patient was provided an opportunity to ask questions and all were answered. The patient agreed with the plan and demonstrated an understanding of the instructions.   The patient was advised to call back or seek an in-person evaluation  if the symptoms worsen or if the condition fails to improve as anticipated.  I spent 30 min in direct care of the patient today and greater than 50% was spent in counseling, coordination of care, education   Letta Median, DO

## 2019-01-30 ENCOUNTER — Inpatient Hospital Stay (HOSPITAL_COMMUNITY): Payer: PPO | Admitting: Nurse Practitioner

## 2019-01-30 ENCOUNTER — Encounter (HOSPITAL_COMMUNITY): Payer: Self-pay | Admitting: Nurse Practitioner

## 2019-01-30 ENCOUNTER — Other Ambulatory Visit: Payer: Self-pay

## 2019-01-30 DIAGNOSIS — Z7951 Long term (current) use of inhaled steroids: Secondary | ICD-10-CM | POA: Diagnosis not present

## 2019-01-30 DIAGNOSIS — D72829 Elevated white blood cell count, unspecified: Secondary | ICD-10-CM

## 2019-01-30 DIAGNOSIS — Z90710 Acquired absence of both cervix and uterus: Secondary | ICD-10-CM | POA: Insufficient documentation

## 2019-01-30 DIAGNOSIS — Z7982 Long term (current) use of aspirin: Secondary | ICD-10-CM | POA: Diagnosis not present

## 2019-01-30 DIAGNOSIS — I10 Essential (primary) hypertension: Secondary | ICD-10-CM

## 2019-01-30 DIAGNOSIS — D508 Other iron deficiency anemias: Secondary | ICD-10-CM | POA: Diagnosis not present

## 2019-01-30 DIAGNOSIS — J449 Chronic obstructive pulmonary disease, unspecified: Secondary | ICD-10-CM | POA: Insufficient documentation

## 2019-01-30 DIAGNOSIS — F17210 Nicotine dependence, cigarettes, uncomplicated: Secondary | ICD-10-CM | POA: Insufficient documentation

## 2019-01-30 DIAGNOSIS — Z7902 Long term (current) use of antithrombotics/antiplatelets: Secondary | ICD-10-CM | POA: Diagnosis not present

## 2019-01-30 DIAGNOSIS — E119 Type 2 diabetes mellitus without complications: Secondary | ICD-10-CM | POA: Insufficient documentation

## 2019-01-30 DIAGNOSIS — E538 Deficiency of other specified B group vitamins: Secondary | ICD-10-CM | POA: Insufficient documentation

## 2019-01-30 DIAGNOSIS — Z79899 Other long term (current) drug therapy: Secondary | ICD-10-CM

## 2019-01-30 DIAGNOSIS — F419 Anxiety disorder, unspecified: Secondary | ICD-10-CM

## 2019-01-30 DIAGNOSIS — E785 Hyperlipidemia, unspecified: Secondary | ICD-10-CM | POA: Diagnosis not present

## 2019-01-30 DIAGNOSIS — Z7984 Long term (current) use of oral hypoglycemic drugs: Secondary | ICD-10-CM | POA: Insufficient documentation

## 2019-01-30 DIAGNOSIS — D500 Iron deficiency anemia secondary to blood loss (chronic): Secondary | ICD-10-CM

## 2019-01-30 NOTE — Progress Notes (Signed)
[name], [place] [address]   CLINIC:  Medical Oncology/Hematology  PCP:  Ronnald Nian, DO [place] [address] [phone]   REASON FOR VISIT: Follow-up for iron deficiency anemia.  CURRENT THERAPY: Intermittent iron infusions   INTERVAL HISTORY:  Tammy Mcpherson 63 y.o. adult returns for routine follow-up for iron deficiency anemia.  Tammy Mcpherson reports her COPD has been acting up and causing her shortness of breath and chest pain.  Tammy Mcpherson has seen her primary care doctor for this recently.  Tammy Mcpherson is also been recently diagnosed with diabetes.  Tammy Mcpherson was started on metformin 500 mg twice daily.  Tammy Mcpherson reports as her blood sugars are coming down Tammy Mcpherson feels fatigued and dizzy.  Tammy Mcpherson is having a hard time adjusting her diet.  Tammy Mcpherson denies any bleeding in her stool or urine. Denies any nausea, vomiting, or diarrhea. Denies any new pains. Had not noticed any recent bleeding such as epistaxis, hematuria or hematochezia. Denies recent chest pain on exertion, shortness of breath on minimal exertion, pre-syncopal episodes, or palpitations. Denies any numbness or tingling in hands or feet. Denies any recent fevers, infections, or recent hospitalizations. Patient reports appetite at 75% and energy level at 25%.  Tammy Mcpherson is eating well and maintaining her weight at this time.    REVIEW OF SYSTEMS:  Review of Systems  Constitutional: Positive for fatigue.  Respiratory: Positive for cough and shortness of breath.   Cardiovascular: Positive for chest pain.  Neurological: Positive for dizziness and numbness (fingertips).  All other systems reviewed and are negative.    PAST MEDICAL/SURGICAL HISTORY:  Past Medical History:  Diagnosis Date  . Anemia 04/30/2016  . Anxiety   . Blood transfusion without reported diagnosis   . Cardiomyopathy ([place])    a. EF 40-45% by echo in 04/2016 b. Improved to 60-65% by repeat imaging in 2018  . CHF (congestive heart  failure) ([place])   . COPD (chronic obstructive pulmonary disease) ([place])   . Coronary artery calcification seen on CT scan   . Essential hypertension   . GERD (gastroesophageal reflux disease)   . History of bronchitis   . History of GI bleed   . Hyperlipidemia   . Iron deficiency anemia 05/12/2016   Bleeding ulcers  . Pollen allergy   . PSVT (paroxysmal supraventricular tachycardia) ([place])   . PVC's (premature ventricular contractions)    Past Surgical History:  Procedure Laterality Date  . ABDOMINAL HYSTERECTOMY     polyps  about age 19  . Barbie Banner OSTEOTOMY Right 07/10/2013   Procedure: Barbie Banner OSTEOTOMY RIGHT FOOT;  Surgeon: Marcheta Grammes, DPM;  Location: [facility];  Service: Orthopedics;  Laterality: Right;  . AMPUTATION Left 05/09/2017   Procedure: AMPUTATION 5TH TOE LEFT FOOT;  Surgeon: [name], DPM;  Location: [facility];  Service: Podiatry;  Laterality: Left;  . AMPUTATION Left 06/13/2017   Procedure: AMPUTATION 2ND TOE LEFT FOOT;  Surgeon: [name], DPM;  Location: [facility];  Service: Podiatry;  Laterality: Left;  . BUNIONECTOMY Right 07/10/2013   Procedure: VOGLER BUNIONECTOMY RIGHT FOOT;  Surgeon: Marcheta Grammes, DPM;  Location: [facility];  Service: Orthopedics;  Laterality: Right;  . CHOLECYSTECTOMY N/A 08/22/2017   Procedure: LAPAROSCOPIC CHOLECYSTECTOMY;  Surgeon: Virl Cagey, MD;  Location: [facility];  Service: General;  Laterality: N/A;  . COLONOSCOPY N/A 07/07/2016   Dr. Oneida Alar; redundant left colon, diverticulosis at hepatic flexure, non-bleeding internal hemorrhoids  . ESOPHAGOGASTRODUODENOSCOPY N/A 07/07/2016  Dr. Oneida Alar: many non-bleeding cratered gastric ulcers without stigmata of bleeding in gastric antrum. four 2-3 mm angioectasias without bleeding in duodenal bulb and second portion of duodenum s/p APC. Chroni gastritis on path.   . ESOPHAGOGASTRODUODENOSCOPY N/A 08/03/2017   Dr. Oneida Alar: erosive gastritis, AVMs. Found a single non-bleeding  angioectasia in stomach, s/p APC therapy. Four non-bleeding angioectasias in duodenum s/p APC. Non-bleeding erosive gastropathy  . IR ANGIOGRAM EXTREMITY LEFT  04/24/2017  . IR FEM POP ART ATHERECT INC PTA MOD SED  04/24/2017  . IR INFUSION THROMBOL ARTERIAL INITIAL (MS)  04/24/2017  . IR RADIOLOGIST EVAL & MGMT  12/05/2016  . IR US GUIDE VASC ACCESS RIGHT  04/24/2017  . LIVER BIOPSY N/A 08/22/2017   Procedure: LIVER BIOPSY;  Surgeon: Virl Cagey, MD;  Location: [facility];  Service: General;  Laterality: N/A;  . METATARSAL HEAD EXCISION Right 07/10/2013   Procedure: METATARSAL HEAD RESECTION OF DIGITS 2 AND 3 RIGHT FOOT;  Surgeon: Marcheta Grammes, DPM;  Location: [facility];  Service: Orthopedics;  Laterality: Right;  . PROXIMAL INTERPHALANGEAL FUSION (PIP) Right 07/10/2013   Procedure: ARTHRODESIS PIPJ  2ND DIGIT RIGHT FOOT;  Surgeon: Marcheta Grammes, DPM;  Location: [facility];  Service: Orthopedics;  Laterality: Right;     SOCIAL HISTORY:  Social History   Socioeconomic History  . Marital status: Married    Spouse name: [name]  . Number of children: 1  . Years of education: 61  . Highest education level: Not on file  Occupational History  . Occupation: disabled    Comment: heart  Social Needs  . Financial resource strain: Not on file  . Food insecurity:    Worry: Not on file    Inability: Not on file  . Transportation needs:    Medical: Not on file    Non-medical: Not on file  Tobacco Use  . Smoking status: Current Every Day Smoker    Packs/day: 1.00    Years: 44.00    Pack years: 44.00    Types: Cigarettes    Start date: 05/10/1975  . Smokeless tobacco: Never Used  . Tobacco comment: pack per day 1.15.20  Substance and Sexual Activity  . Alcohol use: Yes    Alcohol/week: 2.0 standard drinks    Types: 2 Glasses of wine per week  . Drug use: No  . Sexual activity: Yes    Birth control/protection: Surgical  Lifestyle  . Physical activity:    Days per week: Not on  file    Minutes per session: Not on file  . Stress: Not on file  Relationships  . Social connections:    Talks on phone: Not on file    Gets together: Not on file    Attends religious service: Not on file    Active member of club or organization: Not on file    Attends meetings of clubs or organizations: Not on file    Relationship status: Not on file  . Intimate partner violence:    Fear of current or ex partner: Not on file    Emotionally abused: Not on file    Physically abused: Not on file    Forced sexual activity: Not on file  Other Topics Concern  . Not on file  Social History Narrative   Disabled   Lives with husband [name]   Two dogs    FAMILY HISTORY:  Family History  Adopted: Yes  Problem Relation Age of Onset  . Hypertension Father   .  Coronary artery disease Sister   . Ulcers Maternal Grandmother   . Colon cancer Neg Hx        unknown, was adopted    CURRENT MEDICATIONS:  Outpatient Encounter Medications as of 01/30/2019  Medication Sig Note  . acetaminophen (TYLENOL) 500 MG tablet Take 500 mg by mouth at bedtime as needed for moderate pain.   Marland Kitchen albuterol (PROAIR HFA) 108 (90 Base) MCG/ACT inhaler INHALE 2 PUFFS INTO THE LUNGS EVERY 6 HOURS AS NEEDED FOR WHEEZING OR SHORTNESS OF BREATH.   Marland Kitchen albuterol (PROVENTIL) (2.5 MG/3ML) 0.083% nebulizer solution Take 3 mLs (2.5 mg total) by nebulization every 4 (four) hours as needed for wheezing or shortness of breath.   Marland Kitchen aspirin 81 MG chewable tablet Chew 81 mg by mouth every morning.    Marland Kitchen atorvastatin (LIPITOR) 20 MG tablet TAKE ONE TABLET (20MG TOTAL) BY MOUTH DAILY (Patient taking differently: Take 20 mg by mouth every morning. )   . bisoprolol (ZEBETA) 10 MG tablet Take 1 tablet (10 mg total) by mouth daily.   . Blood Glucose Monitoring Suppl (ONE TOUCH ULTRA 2) w/Device KIT    . budesonide-formoterol (SYMBICORT) 160-4.5 MCG/ACT inhaler Inhale 2 puffs into the lungs every 12 (twelve) hours. (Patient taking  differently: Inhale 2 puffs into the lungs 2 (two) times daily as needed (for shortness of breath). )   . cyanocobalamin (,VITAMIN B-12,) 1000 MCG/ML injection Inject 1 mL (1,000 mcg total) into the muscle every 30 (thirty) days. 01/21/2019: 01/09/2019  . glucose monitoring kit (FREESTYLE) monitoring kit Use to check blood sugar BID as directed   . Lancets (ONETOUCH DELICA PLUS [id number]) MISC    . losartan (COZAAR) 100 MG tablet TAKE ONE (1) TABLET BY MOUTH EVERY DAY (Patient taking differently: Take 100 mg by mouth every morning. TAKE ONE (1) TABLET BY MOUTH EVERY DAY)   . metFORMIN (GLUCOPHAGE) 500 MG tablet Take 1 tablet (500 mg total) by mouth 2 (two) times daily with a meal.   . Misc. Devices MISC Please provide supplies (needle, syringe, alcohol swabs) needed for patient to self-administer B-12 injections monthly.   . ONE TOUCH ULTRA TEST test strip    . pantoprazole (PROTONIX) 40 MG tablet TAKE ONE (1) TABLET BY MOUTH EVERY DAY (Patient taking differently: Take 40 mg by mouth every morning. TAKE ONE (1) TABLET BY MOUTH EVERY DAY)   . Tiotropium Bromide Monohydrate (SPIRIVA RESPIMAT) 1.25 MCG/ACT AERS Inhale 2 puffs into the lungs daily. (Patient taking differently: Inhale 1 puff into the lungs 2 (two) times a day. )   . varenicline (CHANTIX PAK) 0.5 MG X 11 & 1 MG X 42 tablet Take one 0.5 mg tablet by mouth once daily for 3 days, then increase to one 0.5 mg tablet twice daily for 4 days, then increase to one 1 mg tablet twice daily. (Patient not taking: Reported on 12/18/2018)    No facility-administered encounter medications on file as of 01/30/2019.     ALLERGIES:  Allergies  Allergen Reactions  . Bee Venom Swelling  . Codeine Anaphylaxis    Tongue swelled  . Penicillins Swelling and Rash    Has patient had a PCN reaction causing immediate rash, facial/tongue/throat swelling, SOB or lightheadedness with hypotension: No, delayed Has patient had a PCN reaction causing severe rash involving  mucus membranes or skin necrosis: No Has patient had a PCN reaction that required hospitalization No Has patient had a PCN reaction occurring within the last 10 years: No If all of the  above answers are "NO", then may proceed with Cephalosporin use.   . Bupropion Other (See Comments)    "Made my skin crawl"  . Sudafed [Pseudoephedrine Hcl] Rash     PHYSICAL EXAM:  ECOG Performance status: 1  Vitals:   01/30/19 0900  BP: (!) 165/74  Pulse: 85  Resp: 20  Temp: 97.9 F (36.6 C)  SpO2: 100%   Filed Weights   01/30/19 0900  Weight: 154 lb 14.4 oz (70.3 kg)    Physical Exam Constitutional:      Appearance: Normal appearance. Tammy Mcpherson is normal weight.  Cardiovascular:     Rate and Rhythm: Normal rate and regular rhythm.     Heart sounds: Normal heart sounds.  Pulmonary:     Effort: Pulmonary effort is normal.     Breath sounds: Normal breath sounds.  Abdominal:     General: Bowel sounds are normal.     Palpations: Abdomen is soft.  Musculoskeletal: Normal range of motion.  Skin:    General: Skin is warm and dry.  Neurological:     Mental Status: Tammy Mcpherson is alert and oriented to person, place, and time. Mental status is at baseline.  Psychiatric:        Mood and Affect: Mood normal.        Behavior: Behavior normal.        Thought Content: Thought content normal.        Judgment: Judgment normal.      LABORATORY DATA:  I have reviewed the labs as listed.  CBC    Component Value Date/Time   WBC 13.1 (H) 01/21/2019 1620   RBC 4.38 01/21/2019 1620   HGB 13.7 01/21/2019 1620   HCT 41.4 01/21/2019 1620   PLT 352 01/21/2019 1620   MCV 94.5 01/21/2019 1620   MCH 31.3 01/21/2019 1620   MCHC 33.1 01/21/2019 1620   RDW 13.5 01/21/2019 1620   LYMPHSABS 1.3 01/21/2019 1354   MONOABS 1.3 (H) 01/21/2019 1354   EOSABS 0.1 01/21/2019 1354   BASOSABS 0.1 01/21/2019 1354   CMP Latest Ref Rng & Units 01/21/2019 01/21/2019 10/08/2018  Glucose 70 - 99 mg/dL 548(HH) 552(HH) 250(H)   BUN 8 - 23 mg/dL '9 8 9  ' Creatinine 0.44 - 1.00 mg/dL 0.87 0.79 0.74  Sodium 135 - 145 mmol/L 131(L) 128(L) 137  Potassium 3.5 - 5.1 mmol/L 4.1 4.0 3.9  Chloride 98 - 111 mmol/L 99 97(L) 107  CO2 22 - 32 mmol/L 20(L) 19(L) 21(L)  Calcium 8.9 - 10.3 mg/dL 9.1 8.9 9.3  Total Protein 6.5 - 8.1 g/dL - 7.2 7.4  Total Bilirubin 0.3 - 1.2 mg/dL - 0.5 0.4  Alkaline Phos 38 - 126 U/L - 133(H) 109  AST 15 - 41 U/L - 17 21  ALT 0 - 44 U/L - 22 22    I personally performed a face-to-face visit.  All questions were answered to patient's stated satisfaction. Encouraged patient to call with any new concerns or questions before his next visit to the cancer center and we can certain see him sooner, if needed.     ASSESSMENT & PLAN:   Anemia, iron deficiency 1.  Iron deficiency anemia: - Likely due to from chronic GI blood loss from gastric and small bowel AVMs, patient was on aspirin and Plavix. -Plavix was discontinued in October 2019.  Tammy Mcpherson does continue taking her aspirin 81 mg daily -Tammy Mcpherson has a history of receiving multiple blood transfusions. -Tammy Mcpherson denies any bleeding per rectum or melena.  Tammy Mcpherson does complain of fatigue. - Her last Feraheme infusion was on 10/17/2018 and 10/24/2018. - Tammy Mcpherson had labs on 01/21/2019 showed her hemoglobin 13.7, ferritin 114, and percent saturation 22. -Tammy Mcpherson has normally been getting iron infusions every 3 months.  Her hemoglobin ferritin are higher today.  We will spot check her labs in 1 month. -We will see her back in 2 months with repeat labs.  2.  Leukocytosis and thrombocytosis: - Her [place] count has been elevated since 2004, predominantly neutrophils.  Tammy Mcpherson also has had a elevated platelet count. - Her blood work was evaluated and BCR/ABL by FISH and Jak 2 V6 17 F testing were negative ruling out myeloproliferative disorders. -Labs on 01/21/2019 showed her WBC at 13.1 and platelets at 352. -We will continue to monitor.  3.  B12 deficiency: - Labs on 01/21/2019 showed  her B12 level at 900. -Tammy Mcpherson has been getting monthly B12 injections. -We will continue to monitor her levels.  4.  Newly diagnosed diabetes: - Her blood sugars were found to be over 500. - Her PCP placed her on metformin 500 mg twice daily. - Tammy Mcpherson reports the lowest Tammy Mcpherson seen her blood sugar was 213. - Tammy Mcpherson reports when her blood sugars are in the 200s Tammy Mcpherson feels fatigued and dizzy. -Tammy Mcpherson will follow-up with her PCP in a few weeks.      Orders placed this encounter:  Orders Placed This Encounter  Procedures  . Lactate dehydrogenase  . CBC with Differential/Platelet  . Comprehensive metabolic panel  . Vitamin B12  . VITAMIN D 25 Hydroxy (Vit-D Deficiency, Fractures)  . Folate  . CBC with Differential/Platelet  . Comprehensive metabolic panel  . Ferritin  . Iron and TIBC     Francene Finders, FNP-C [facility] [phone]

## 2019-01-30 NOTE — Patient Instructions (Signed)
[place] at [facility] Discharge Instructions  We will check your labs in 3 weeks Follow up with [place] in 2 months with labs    Thank you for choosing [place] at [facility] to provide your oncology and hematology care.  To afford each patient quality time with our provider, please arrive at least 15 minutes before your scheduled appointment time.   If you have a lab appointment with the [facility] please come in thru the  Main Entrance and check in at the main information desk  You need to re-schedule your appointment should you arrive 10 or more minutes late.  We strive to give you quality time with our providers, and arriving late affects you and other patients whose appointments are after yours.  Also, if you no show three or more times for appointments you may be dismissed from the clinic at the providers discretion.     Again, thank you for choosing [facility].  Our hope is that these requests will decrease the amount of time that you wait before being seen by our physicians.       _____________________________________________________________  Should you have questions after your visit to [facility], please contact our office at [phone] between the hours of 8:00 a.m. and 4:30 p.m.  Voicemails left after 4:00 p.m. will not be returned until the following business day.  For prescription refill requests, have your pharmacy contact our office and allow 72 hours.    Cancer Center Support Programs:   > Cancer Support Group  2nd Tuesday of the month 1pm-2pm, Journey Room

## 2019-01-30 NOTE — Assessment & Plan Note (Addendum)
1.  Iron deficiency anemia: - Likely due to from chronic GI blood loss from gastric and small bowel AVMs, patient was on aspirin and Plavix. -Plavix was discontinued in October 2019.  She does continue taking her aspirin 81 mg daily -She has a history of receiving multiple blood transfusions. -She denies any bleeding per rectum or melena.  She does complain of fatigue. - Her last Feraheme infusion was on 10/17/2018 and 10/24/2018. - She had labs on 01/21/2019 showed her hemoglobin 13.7, ferritin 114, and percent saturation 22. -She has normally been getting iron infusions every 3 months.  Her hemoglobin ferritin are higher today.  We will spot check her labs in 1 month. -We will see her back in 2 months with repeat labs.  2.  Leukocytosis and thrombocytosis: - Her [place] count has been elevated since 2004, predominantly neutrophils.  She also has had a elevated platelet count. - Her blood work was evaluated and BCR/ABL by FISH and Jak 2 V6 17 F testing were negative ruling out myeloproliferative disorders. -Labs on 01/21/2019 showed her WBC at 13.1 and platelets at 352. -We will continue to monitor.  3.  B12 deficiency: - Labs on 01/21/2019 showed her B12 level at 900. -She has been getting monthly B12 injections. -We will continue to monitor her levels.  4.  Newly diagnosed diabetes: - Her blood sugars were found to be over 500. - Her PCP placed her on metformin 500 mg twice daily. - She reports the lowest she seen her blood sugar was 213. - She reports when her blood sugars are in the 200s she feels fatigued and dizzy. -She will follow-up with her PCP in a few weeks.

## 2019-02-03 ENCOUNTER — Other Ambulatory Visit (HOSPITAL_COMMUNITY): Payer: PPO

## 2019-02-04 ENCOUNTER — Other Ambulatory Visit (HOSPITAL_COMMUNITY): Payer: PPO

## 2019-02-07 ENCOUNTER — Ambulatory Visit: Payer: BLUE CROSS/BLUE SHIELD | Admitting: Family Medicine

## 2019-02-10 ENCOUNTER — Ambulatory Visit (HOSPITAL_COMMUNITY): Payer: PPO | Admitting: Nurse Practitioner

## 2019-02-10 ENCOUNTER — Ambulatory Visit (HOSPITAL_COMMUNITY): Payer: PPO

## 2019-02-11 ENCOUNTER — Ambulatory Visit (HOSPITAL_COMMUNITY): Payer: PPO

## 2019-02-11 ENCOUNTER — Ambulatory Visit (HOSPITAL_COMMUNITY): Payer: PPO | Admitting: Nurse Practitioner

## 2019-02-12 ENCOUNTER — Other Ambulatory Visit: Payer: Self-pay | Admitting: Family Medicine

## 2019-02-12 NOTE — Telephone Encounter (Signed)
Copied from [place] [phone]. Topic: Quick Communication - Rx Refill/Question >> Feb 12, 2019  4:02 PM Reyne Dumas L wrote: Medication: ONE TOUCH ULTRA TEST test strip   Has the patient contacted their pharmacy? Yes - states that they haven't heard from the office (Agent: If no, request that the patient contact the pharmacy for the refill.) (Agent: If yes, when and what did the pharmacy advise?)  Preferred Pharmacy (with phone number or street name): [place], [place] [phone] (Phone) [phone] (Fax)  Agent: Please be advised that RX refills may take up to 3 business days. We ask that you follow-up with your pharmacy.

## 2019-02-13 MED ORDER — ONETOUCH ULTRA BLUE VI STRP: ORAL_STRIP | 3 refills | Status: DC

## 2019-02-13 NOTE — Telephone Encounter (Signed)
Sent rx to pharmacy

## 2019-02-20 ENCOUNTER — Other Ambulatory Visit: Payer: Self-pay

## 2019-02-20 ENCOUNTER — Inpatient Hospital Stay (HOSPITAL_COMMUNITY): Payer: PPO | Attending: Hematology

## 2019-02-20 DIAGNOSIS — D500 Iron deficiency anemia secondary to blood loss (chronic): Secondary | ICD-10-CM | POA: Insufficient documentation

## 2019-02-20 DIAGNOSIS — J449 Chronic obstructive pulmonary disease, unspecified: Secondary | ICD-10-CM | POA: Diagnosis not present

## 2019-02-20 LAB — COMPREHENSIVE METABOLIC PANEL
ALT: 25 U/L (ref 0–44)
AST: 21 U/L (ref 15–41)
Albumin: 3.6 g/dL (ref 3.5–5.0)
Alkaline Phosphatase: 107 U/L (ref 38–126)
Anion gap: 13 (ref 5–15)
BUN: 9 mg/dL (ref 8–23)
CO2: 22 mmol/L (ref 22–32)
Calcium: 9.4 mg/dL (ref 8.9–10.3)
Chloride: 103 mmol/L (ref 98–111)
Creatinine, Ser: 0.69 mg/dL (ref 0.44–1.00)
GFR calc Af Amer: >60 mL/min (ref >60)
GFR calc non Af Amer: >60 mL/min (ref >60)
Glucose, Bld: 259 mg/dL — ABNORMAL HIGH (ref 70–99)
Potassium: 4 mmol/L (ref 3.5–5.1)
Sodium: 138 mmol/L (ref 135–145)
Total Bilirubin: 0.6 mg/dL (ref 0.3–1.2)
Total Protein: 6.9 g/dL (ref 6.5–8.1)

## 2019-02-20 LAB — CBC WITH DIFFERENTIAL/PLATELET
Abs Immature Granulocytes: 0.1 10*3/uL — ABNORMAL HIGH (ref 0.00–0.07)
Basophils Absolute: 0.1 10*3/uL (ref 0.0–0.1)
Basophils Relative: 1 %
Eosinophils Absolute: 0.2 10*3/uL (ref 0.0–0.5)
Eosinophils Relative: 2 %
HCT: 44.3 % (ref 36.0–46.0)
Hemoglobin: 13.9 g/dL (ref 12.0–15.0)
Immature Granulocytes: 1 %
Lymphocytes Relative: 14 %
Lymphs Abs: 1.9 10*3/uL (ref 0.7–4.0)
MCH: 30.7 pg (ref 26.0–34.0)
MCHC: 31.4 g/dL (ref 30.0–36.0)
MCV: 97.8 fL (ref 80.0–100.0)
Monocytes Absolute: 1.4 10*3/uL — ABNORMAL HIGH (ref 0.1–1.0)
Monocytes Relative: 10 %
Neutro Abs: 10.5 10*3/uL — ABNORMAL HIGH (ref 1.7–7.7)
Neutrophils Relative %: 72 %
Platelets: 420 10*3/uL — ABNORMAL HIGH (ref 150–400)
RBC: 4.53 MIL/uL (ref 3.87–5.11)
RDW: 13.1 % (ref 11.5–15.5)
WBC: 14.3 10*3/uL — ABNORMAL HIGH (ref 4.0–10.5)
nRBC: 0 % (ref 0.0–0.2)

## 2019-02-20 LAB — IRON AND TIBC
Iron: 44 ug/dL (ref 28–170)
Saturation Ratios: 12 % (ref 10.4–31.8)
TIBC: 352 ug/dL (ref 250–450)
UIBC: 308 ug/dL

## 2019-02-20 LAB — FERRITIN: Ferritin: 36 ng/mL (ref 11–307)

## 2019-02-25 ENCOUNTER — Telehealth (HOSPITAL_COMMUNITY): Payer: Self-pay | Admitting: Emergency Medicine

## 2019-02-25 NOTE — Telephone Encounter (Signed)
Return call made to patient. VM left for her to call back. Needs to be made aware that Iron is on lower side of normal but HGB is stable. F/u labs in one month per Randi,NP.

## 2019-02-26 ENCOUNTER — Telehealth: Payer: Self-pay | Admitting: Family Medicine

## 2019-02-26 MED ORDER — ONETOUCH DELICA PLUS LANCET33G MISC: 1.0000 | Freq: Two times a day (BID) | 3 refills | Status: DC

## 2019-02-26 MED ORDER — GLUCOSE BLOOD VI STRP: ORAL_STRIP | 12 refills | Status: DC

## 2019-02-26 NOTE — Telephone Encounter (Signed)
rx refilled and sent to pharmacy requested.

## 2019-02-26 NOTE — Telephone Encounter (Signed)
Copied from [place] [phone]. Topic: Quick Communication - Rx Refill/Question >> Feb 26, 2019  2:44 PM Celene Kras A wrote: Medication: Lancets (ONETOUCH DELICA PLUS [id number]) [place]  Has the patient contacted their pharmacy? Yes.  Pts husband called in stating pt will be out of strips tomorrow. He also states pt will need lancets for the ultra 2 meter. Please advise.  (Agent: If no, request that the patient contact the pharmacy for the refill.) (Agent: If yes, when and what did the pharmacy advise?)  Preferred Pharmacy (with phone number or street name): [place], [place] [address] Phone: [phone] Fax: [phone] Not a 24 hour pharmacy; exact hours not known.    Agent: Please be advised that RX refills may take up to 3 business days. We ask that you follow-up with your pharmacy.

## 2019-02-28 ENCOUNTER — Other Ambulatory Visit: Payer: Self-pay | Admitting: *Deleted

## 2019-02-28 NOTE — Patient Outreach (Signed)
[place] [facility]) Care Management  02/28/2019  Tammy Mcpherson [birth year] [id number]   Health risk screening call has been completed. Patient will receive appropriate information to the [name] care management program.   Patient will be followed in the HTA, health risk assessment engaged program.  Plan Will plan follow up call in the next 6 month Will send patient [name]TA  welcome letter  Tammy Draft, RN, [place] Management Coordinator  [phone]- Mobile [phone]- [place]

## 2019-03-05 ENCOUNTER — Encounter: Payer: Self-pay | Admitting: Gastroenterology

## 2019-03-21 ENCOUNTER — Other Ambulatory Visit: Payer: Self-pay | Admitting: *Deleted

## 2019-03-21 NOTE — Patient Outreach (Signed)
[place] [facility]) Care Management  03/21/2019  SADHANA FRATER 03-19-1956 [id number]   Patient  case has been transitioned to [facility] to continue Care Management services.    [name], RN, [place] Management Coordinator  [phone]- Mobile [phone]- Toll Free Main Office

## 2019-03-23 DIAGNOSIS — J449 Chronic obstructive pulmonary disease, unspecified: Secondary | ICD-10-CM | POA: Diagnosis not present

## 2019-03-25 ENCOUNTER — Other Ambulatory Visit: Payer: Self-pay

## 2019-03-25 ENCOUNTER — Inpatient Hospital Stay (HOSPITAL_COMMUNITY): Payer: PPO | Attending: Hematology

## 2019-03-25 DIAGNOSIS — D500 Iron deficiency anemia secondary to blood loss (chronic): Secondary | ICD-10-CM

## 2019-03-25 DIAGNOSIS — E119 Type 2 diabetes mellitus without complications: Secondary | ICD-10-CM | POA: Diagnosis not present

## 2019-03-25 DIAGNOSIS — E538 Deficiency of other specified B group vitamins: Secondary | ICD-10-CM | POA: Insufficient documentation

## 2019-03-25 DIAGNOSIS — Z79899 Other long term (current) drug therapy: Secondary | ICD-10-CM | POA: Diagnosis not present

## 2019-03-25 DIAGNOSIS — Z7984 Long term (current) use of oral hypoglycemic drugs: Secondary | ICD-10-CM | POA: Diagnosis not present

## 2019-03-25 DIAGNOSIS — D508 Other iron deficiency anemias: Secondary | ICD-10-CM | POA: Insufficient documentation

## 2019-03-25 LAB — CBC WITH DIFFERENTIAL/PLATELET
Abs Immature Granulocytes: 0.09 10*3/uL — ABNORMAL HIGH (ref 0.00–0.07)
Basophils Absolute: 0.1 10*3/uL (ref 0.0–0.1)
Basophils Relative: 0 %
Eosinophils Absolute: 0.2 10*3/uL (ref 0.0–0.5)
Eosinophils Relative: 1 %
HCT: 43 % (ref 36.0–46.0)
Hemoglobin: 13.8 g/dL (ref 12.0–15.0)
Immature Granulocytes: 1 %
Lymphocytes Relative: 11 %
Lymphs Abs: 1.7 10*3/uL (ref 0.7–4.0)
MCH: 30.1 pg (ref 26.0–34.0)
MCHC: 32.1 g/dL (ref 30.0–36.0)
MCV: 93.9 fL (ref 80.0–100.0)
Monocytes Absolute: 1.4 10*3/uL — ABNORMAL HIGH (ref 0.1–1.0)
Monocytes Relative: 10 %
Neutro Abs: 11.2 10*3/uL — ABNORMAL HIGH (ref 1.7–7.7)
Neutrophils Relative %: 77 %
Platelets: 468 10*3/uL — ABNORMAL HIGH (ref 150–400)
RBC: 4.58 MIL/uL (ref 3.87–5.11)
RDW: 13.3 % (ref 11.5–15.5)
WBC: 14.7 10*3/uL — ABNORMAL HIGH (ref 4.0–10.5)
nRBC: 0 % (ref 0.0–0.2)

## 2019-03-25 LAB — LACTATE DEHYDROGENASE: LDH: 111 U/L (ref 98–192)

## 2019-03-25 LAB — COMPREHENSIVE METABOLIC PANEL
ALT: 21 U/L (ref 0–44)
AST: 18 U/L (ref 15–41)
Albumin: 3.7 g/dL (ref 3.5–5.0)
Alkaline Phosphatase: 104 U/L (ref 38–126)
Anion gap: 13 (ref 5–15)
BUN: 13 mg/dL (ref 8–23)
CO2: 21 mmol/L — ABNORMAL LOW (ref 22–32)
Calcium: 9.3 mg/dL (ref 8.9–10.3)
Chloride: 105 mmol/L (ref 98–111)
Creatinine, Ser: 0.7 mg/dL (ref 0.44–1.00)
GFR calc Af Amer: >60 mL/min (ref >60)
GFR calc non Af Amer: >60 mL/min (ref >60)
Glucose, Bld: 200 mg/dL — ABNORMAL HIGH (ref 70–99)
Potassium: 4 mmol/L (ref 3.5–5.1)
Sodium: 139 mmol/L (ref 135–145)
Total Bilirubin: 0.4 mg/dL (ref 0.3–1.2)
Total Protein: 7.2 g/dL (ref 6.5–8.1)

## 2019-03-25 LAB — FOLATE: Folate: 11.7 ng/mL

## 2019-03-25 LAB — VITAMIN B12: Vitamin B-12: 544 pg/mL (ref 180–914)

## 2019-03-26 ENCOUNTER — Encounter: Payer: Self-pay | Admitting: Family Medicine

## 2019-03-26 ENCOUNTER — Ambulatory Visit (INDEPENDENT_AMBULATORY_CARE_PROVIDER_SITE_OTHER): Payer: PPO | Admitting: Family Medicine

## 2019-03-26 DIAGNOSIS — Z89422 Acquired absence of other left toe(s): Secondary | ICD-10-CM

## 2019-03-26 DIAGNOSIS — M79672 Pain in left foot: Secondary | ICD-10-CM | POA: Diagnosis not present

## 2019-03-26 DIAGNOSIS — E1165 Type 2 diabetes mellitus with hyperglycemia: Secondary | ICD-10-CM | POA: Diagnosis not present

## 2019-03-26 DIAGNOSIS — E1140 Type 2 diabetes mellitus with diabetic neuropathy, unspecified: Secondary | ICD-10-CM

## 2019-03-26 LAB — VITAMIN D 25 HYDROXY (VIT D DEFICIENCY, FRACTURES): Vit D, 25-Hydroxy: 15.5 ng/mL — ABNORMAL LOW (ref 30.0–100.0)

## 2019-03-26 MED ORDER — FREESTYLE LIBRE SENSOR SYSTEM MISC: 0 refills | Status: DC

## 2019-03-26 MED ORDER — GABAPENTIN 300 MG PO CAPS: 300.0000 mg | ORAL_CAPSULE | Freq: Every day | ORAL | 3 refills | Status: DC

## 2019-03-26 MED ORDER — METFORMIN HCL ER 500 MG PO TB24: 1000.0000 mg | ORAL_TABLET | Freq: Every day | ORAL | 3 refills | Status: DC

## 2019-03-26 NOTE — Progress Notes (Signed)
Virtual Visit via Video Note  I connected with Tammy Mcpherson on 03/26/19 at  1:30 PM EDT by a video enabled telemedicine application and verified that I am speaking with the correct person using two identifiers. Location patient: home Location provider:  home office Persons participating in the virtual visit: patient, provider  I discussed the limitations of evaluation and management by telemedicine and the availability of in person appointments. The patient expressed understanding and agreed to proceed.  Chief Complaint  Patient presents with  . Diabetes    DM concerns pt states shes had some high readings the lowest 160 last night it was 300 and back down this morening at 190 she states she feels drained/ pts having concerns of her 2nd toe was amputated 2 years ago and hasn't healed pt states feels like its on fire/ Pt also concerned with some numbness and "deflating" of her 3 fingers on her right hand and 2 fingers on her left unable to get blood when checking her BS.     HPI: Tammy Mcpherson is a 63 y.o. adult female for DM f/u. She was diagnosed with DM in 01/2019 with A1C = 11.8 at that time.  She has been checking her BS at home. 30 day average is 205 per pts glucometer. Fasting AM - 170 this AM and this is about average Highest in PM - 309 (pt is not sure of post-prandial average) Pt takes metformin 536m qAM consistently but often does not take PM dose. She takes her PM metformin about 3x/wk. She feels it makes her feel very tired and weak in AM. She denies GI side effects.  Pt eats 1 Little Debbie cookie at bedtime nightly right before bed. No exercise.  Pt would like to try Freestyle Libre glucose monitor as her insurance will cover about 80% of the cost. [facility] glucose [facility].  She complains of numbness and tinging in fingers on both hands 4-5 mornings per week that is worse in the AM and improves within a few  hours of being up.   She complains of 1 mo h/o "burning pain" in the area on her Lt foot, 2nd MTP joint where she had 2nd toe amputated 2 years ago. She does not feel the site ever fully healed. She has pain that seems to be worse at night, at times to the point where it hurts for the bed sheet to touch the area. No redness. Maybe slight swelling at times. No drainage. No open wound. No fever, chills.   Pt denies HA, CP, SOB, n/v/d/c.   Past Medical History:  Diagnosis Date  . Anemia 04/30/2016  . Anxiety   . Blood transfusion without reported diagnosis   . [place]omyopathy [place]    a. EF 40-45% by echo in 04/2016 b. Improved to 60-65% by repeat imaging in 2018  . CHF (congestive heart failure) ([facility]   . COPD (chronic obstructive pulmonary disease) ([place]   . Coronary artery calcification seen on CT scan   . Essential hypertension   . GERD (gastroesophageal reflux disease)   . History of bronchitis   . History of GI bleed   . Hyperlipidemia   . Iron deficiency anemia 05/12/2016   Bleeding ulcers  . Pollen allergy   . PSVT (paroxysmal supraventricular tachycardia) ([facility] [place]   . PVC's (premature ventricular contractions)     Past Surgical History:  Procedure Laterality Date  . ABDOMINAL HYSTERECTOMY  polyps  about age 89  . Barbie Banner OSTEOTOMY Right 07/10/2013   Procedure: Barbie Banner OSTEOTOMY RIGHT FOOT;  Surgeon: Marcheta Grammes, DPM;  Location: [facility];  Service: Orthopedics;  Laterality: Right;  . AMPUTATION Left 05/09/2017   Procedure: AMPUTATION 5TH TOE LEFT FOOT;  Surgeon: [name], DPM;  Location: [facility];  Service: Podiatry;  Laterality: Left;  . AMPUTATION Left 06/13/2017   Procedure: AMPUTATION 2ND TOE LEFT FOOT;  Surgeon: [name], DPM;  Location: [facility];  Service: Podiatry;  Laterality: Left;  . BUNIONECTOMY Right 07/10/2013   Procedure: VOGLER BUNIONECTOMY RIGHT FOOT;  Surgeon: Marcheta Grammes, DPM;  Location: [facility];  Service: Orthopedics;  Laterality:  Right;  . CHOLECYSTECTOMY N/A 08/22/2017   Procedure: LAPAROSCOPIC CHOLECYSTECTOMY;  Surgeon: Virl Cagey, MD;  Location: [facility];  Service: General;  Laterality: N/A;  . COLONOSCOPY N/A 07/07/2016   Dr. Oneida Alar; redundant left colon, diverticulosis at hepatic flexure, non-bleeding internal hemorrhoids  . ESOPHAGOGASTRODUODENOSCOPY N/A 07/07/2016   Dr. Oneida Alar: many non-bleeding cratered gastric ulcers without stigmata of bleeding in gastric antrum. four 2-3 mm angioectasias without bleeding in duodenal bulb and second portion of duodenum s/p APC. Chroni gastritis on path.   . ESOPHAGOGASTRODUODENOSCOPY N/A 08/03/2017   Dr. Oneida Alar: erosive gastritis, AVMs. Found a single non-bleeding angioectasia in stomach, s/p APC therapy. Four non-bleeding angioectasias in duodenum s/p APC. Non-bleeding erosive gastropathy  . IR ANGIOGRAM EXTREMITY LEFT  04/24/2017  . IR FEM POP ART ATHERECT INC PTA MOD SED  04/24/2017  . IR INFUSION THROMBOL ARTERIAL INITIAL (MS)  04/24/2017  . IR RADIOLOGIST EVAL & MGMT  12/05/2016  . IR US GUIDE VASC ACCESS RIGHT  04/24/2017  . LIVER BIOPSY N/A 08/22/2017   Procedure: LIVER BIOPSY;  Surgeon: Virl Cagey, MD;  Location: [facility];  Service: General;  Laterality: N/A;  . METATARSAL HEAD EXCISION Right 07/10/2013   Procedure: METATARSAL HEAD RESECTION OF DIGITS 2 AND 3 RIGHT FOOT;  Surgeon: Marcheta Grammes, DPM;  Location: [facility];  Service: Orthopedics;  Laterality: Right;  . PROXIMAL INTERPHALANGEAL FUSION (PIP) Right 07/10/2013   Procedure: ARTHRODESIS PIPJ  2ND DIGIT RIGHT FOOT;  Surgeon: Marcheta Grammes, DPM;  Location: [facility];  Service: Orthopedics;  Laterality: Right;    Family History  Adopted: Yes  Problem Relation Age of Onset  . Hypertension Father   . Coronary artery disease Sister   . Ulcers Maternal Grandmother   . Colon cancer Neg Hx        unknown, was adopted    Social History   Tobacco Use  . Smoking status: Current Every Day Smoker     Packs/day: 1.00    Years: 44.00    Pack years: 44.00    Types: Cigarettes    Start date: 05/10/1975  . Smokeless tobacco: Never Used  . Tobacco comment: pack per day 1.15.20  Substance Use Topics  . Alcohol use: Yes    Alcohol/week: 2.0 standard drinks    Types: 2 Glasses of wine per week  . Drug use: No     Current Outpatient Medications:  .  acetaminophen (TYLENOL) 500 MG tablet, Take 500 mg by mouth at bedtime as needed for moderate pain., Disp: , Rfl:  .  albuterol (PROVENTIL) (2.5 MG/3ML) 0.083% nebulizer solution, Take 3 mLs (2.5 mg total) by nebulization every 4 (four) hours as needed for wheezing or shortness of breath., Disp: 75 mL, Rfl: 5 .  aspirin 81 MG chewable tablet, Chew 81 mg by mouth  every morning. , Disp: , Rfl:  .  atorvastatin (LIPITOR) 20 MG tablet, TAKE ONE TABLET (20MG TOTAL) BY MOUTH DAILY (Patient taking differently: Take 20 mg by mouth every morning. ), Disp: 90 tablet, Rfl: 3 .  bisoprolol (ZEBETA) 10 MG tablet, Take 1 tablet (10 mg total) by mouth daily., Disp: 90 tablet, Rfl: 3 .  Blood Glucose Monitoring Suppl (ONE TOUCH ULTRA 2) w/Device KIT, , Disp: , Rfl:  .  budesonide-formoterol (SYMBICORT) 160-4.5 MCG/ACT inhaler, Inhale 2 puffs into the lungs every 12 (twelve) hours. (Patient taking differently: Inhale 2 puffs into the lungs 2 (two) times daily as needed (for shortness of breath). ), Disp: 1 Inhaler, Rfl: 0 .  cyanocobalamin (,VITAMIN B-12,) 1000 MCG/ML injection, Inject 1 mL (1,000 mcg total) into the muscle every 30 (thirty) days., Disp: 1 mL, Rfl: 11 .  glucose blood (ONETOUCH ULTRA) test strip, Use as instructed, Disp: 100 each, Rfl: 12 .  glucose monitoring kit (FREESTYLE) monitoring kit, Use to check blood sugar BID as directed, Disp: 1 each, Rfl: 0 .  Lancets (ONETOUCH DELICA PLUS [id number]) MISC, 1 each by Does not apply route 2 (two) times a day., Disp: 100 each, Rfl: 3 .  losartan (COZAAR) 100 MG tablet, TAKE ONE (1) TABLET BY MOUTH EVERY  DAY (Patient taking differently: Take 100 mg by mouth every morning. TAKE ONE (1) TABLET BY MOUTH EVERY DAY), Disp: 90 tablet, Rfl: 1 .  metFORMIN (GLUCOPHAGE) 500 MG tablet, Take 1 tablet (500 mg total) by mouth 2 (two) times daily with a meal., Disp: 180 tablet, Rfl: 3 .  Misc. Devices MISC, Please provide supplies (needle, syringe, alcohol swabs) needed for patient to self-administer B-12 injections monthly., Disp: 1 each, Rfl: 12 .  pantoprazole (PROTONIX) 40 MG tablet, TAKE ONE (1) TABLET BY MOUTH EVERY DAY (Patient taking differently: Take 40 mg by mouth every morning. TAKE ONE (1) TABLET BY MOUTH EVERY DAY), Disp: 90 tablet, Rfl: 3 .  Tiotropium Bromide Monohydrate (SPIRIVA RESPIMAT) 1.25 MCG/ACT AERS, Inhale 2 puffs into the lungs daily. (Patient taking differently: Inhale 1 puff into the lungs 2 (two) times a day. ), Disp: 4 g, Rfl: 3 .  varenicline (CHANTIX PAK) 0.5 MG X 11 & 1 MG X 42 tablet, Take one 0.5 mg tablet by mouth once daily for 3 days, then increase to one 0.5 mg tablet twice daily for 4 days, then increase to one 1 mg tablet twice daily., Disp: 53 tablet, Rfl: 0 .  albuterol (PROAIR HFA) 108 (90 Base) MCG/ACT inhaler, INHALE 2 PUFFS INTO THE LUNGS EVERY 6 HOURS AS NEEDED FOR WHEEZING OR SHORTNESS OF BREATH. (Patient not taking: Reported on 03/26/2019), Disp: 18 g, Rfl: 3  Allergies  Allergen Reactions  . Bee Venom Swelling  . Codeine Anaphylaxis    Tongue swelled  . Penicillins Swelling and Rash    Has patient had a PCN reaction causing immediate rash, facial/tongue/throat swelling, SOB or lightheadedness with hypotension: No, delayed Has patient had a PCN reaction causing severe rash involving mucus membranes or skin necrosis: No Has patient had a PCN reaction that required hospitalization No Has patient had a PCN reaction occurring within the last 10 years: No If all of the above answers are "NO", then may proceed with Cephalosporin use.   . Bupropion Other (See Comments)     "Made my skin crawl"  . Sudafed [Pseudoephedrine Hcl] Rash      ROS: See pertinent positives and negatives per HPI.   EXAM:  VITALS per patient if applicable: There were no vitals taken for this visit.   GENERAL: alert, oriented, appears well and in no acute distress  NECK: normal movements of the head and neck  LUNGS: on inspection no signs of respiratory distress, breathing rate appears normal, no obvious gross SOB, gasping or wheezing, no conversational dyspnea  CV: no obvious cyanosis  EXTREM: pt was not able to turn camera around to show me the affected area on her Lt foot  PSYCH/NEURO: pleasant and cooperative, speech and thought processing grossly intact   ASSESSMENT AND PLAN: 1. Uncontrolled type 2 diabetes mellitus with hyperglycemia (HCC) - Hemoglobin A1c; Future - pt will go to [facility] to have lab done on Fri 6/26 - pt has not been taking metformin 540m PM dose consistently so will switch to XR [id number] to help with compliance and reduce side effects - metFORMIN (GLUCOPHAGE XR) 500 MG 24 hr tablet; Take 2 tablets (1,000 mg total) by mouth daily with breakfast.  Dispense: 90 tablet; Refill: 3 - Continuous Blood Gluc Sensor (FRL'AnseMISC; Use as directed  Dispense: 1 each; Refill: 0 - cont ASA, statin, ARB - needs to schedule appt with podiatry for foot exam - f/u in 57m9mo sooner PRN  2. Left foot pain 3. H/O amputation of lesser toe, left (HCC) - could be d/t neuropathy from pts uncontrolled DM but pt also has a h/o toe amputation x 2 and PAD - will check xray to r/o osteomyelitis  - DG Foot Complete Left; Future - advised pt to call vascular surg to schedule f/u appt for eval as well  4. Type 2 diabetes mellitus with diabetic neuropathy, without long-term current use of insulin (HCC) Rx: - gabapentin 300m70mcap qHS  - f/u in 2-4 wks or sooner PRN  I discussed the assessment and treatment plan with the patient. The  patient was provided an opportunity to ask questions and all were answered. The patient agreed with the plan and demonstrated an understanding of the instructions.   The patient was advised to call back or seek an in-person evaluation if the symptoms worsen or if the condition fails to improve as anticipated.   MaryLetta Median

## 2019-03-27 ENCOUNTER — Ambulatory Visit: Payer: PPO | Admitting: Pulmonary Disease

## 2019-03-28 ENCOUNTER — Other Ambulatory Visit (HOSPITAL_COMMUNITY)
Admission: RE | Admit: 2019-03-28 | Discharge: 2019-03-28 | Disposition: A | Discharge status: Home / Self Care | Payer: PPO | Source: Ambulatory Visit | Attending: Family Medicine | Admitting: Family Medicine

## 2019-03-28 ENCOUNTER — Other Ambulatory Visit: Payer: Self-pay

## 2019-03-28 ENCOUNTER — Ambulatory Visit (HOSPITAL_COMMUNITY)
Admission: RE | Admit: 2019-03-28 | Discharge: 2019-03-28 | Disposition: A | Discharge status: Home / Self Care | Payer: PPO | Source: Ambulatory Visit | Attending: Family Medicine | Admitting: Family Medicine

## 2019-03-28 DIAGNOSIS — M79672 Pain in left foot: Secondary | ICD-10-CM | POA: Diagnosis not present

## 2019-03-28 DIAGNOSIS — E1165 Type 2 diabetes mellitus with hyperglycemia: Secondary | ICD-10-CM | POA: Insufficient documentation

## 2019-03-28 DIAGNOSIS — Z89422 Acquired absence of other left toe(s): Secondary | ICD-10-CM | POA: Diagnosis not present

## 2019-03-28 LAB — HEMOGLOBIN A1C
Hgb A1c MFr Bld: 9.6 % — ABNORMAL HIGH (ref 4.8–5.6)
Mean Plasma Glucose: 228.82 mg/dL

## 2019-04-01 ENCOUNTER — Inpatient Hospital Stay (HOSPITAL_BASED_OUTPATIENT_CLINIC_OR_DEPARTMENT_OTHER): Payer: PPO | Admitting: Nurse Practitioner

## 2019-04-01 ENCOUNTER — Other Ambulatory Visit: Payer: Self-pay

## 2019-04-01 DIAGNOSIS — D508 Other iron deficiency anemias: Secondary | ICD-10-CM | POA: Diagnosis not present

## 2019-04-01 DIAGNOSIS — Z79899 Other long term (current) drug therapy: Secondary | ICD-10-CM | POA: Diagnosis not present

## 2019-04-01 DIAGNOSIS — E538 Deficiency of other specified B group vitamins: Secondary | ICD-10-CM

## 2019-04-01 DIAGNOSIS — Z7984 Long term (current) use of oral hypoglycemic drugs: Secondary | ICD-10-CM | POA: Diagnosis not present

## 2019-04-01 DIAGNOSIS — E119 Type 2 diabetes mellitus without complications: Secondary | ICD-10-CM | POA: Diagnosis not present

## 2019-04-01 NOTE — Progress Notes (Signed)
Virtual Visit via Telephone Note  I connected with Tammy Mcpherson on 04/01/19 at  1:00 PM EDT by telephone and verified that I am speaking with the correct person using two identifiers.   I discussed the limitations, risks, security and privacy concerns of performing an evaluation and management service by telephone and the availability of in person appointments. I also discussed with the patient that there may be a patient responsible charge related to this service. The patient expressed understanding and agreed to proceed.   History of Present Illness: -Patient was called today for a telephone interview for her iron deficiency anemia.  She reports that she has felt better since her last iron infusions, and she has no complaints at this time. Denies any nausea, vomiting, or diarrhea. Denies any new pains. Had not noticed any recent bleeding such as epistaxis, hematuria or hematochezia. Denies recent chest pain on exertion, shortness of breath on minimal exertion, pre-syncopal episodes, or palpitations. Denies any numbness or tingling in hands or feet. Denies any recent fevers, infections, or recent hospitalizations. Patient reports appetite at 100% and energy level at 75%.  Is eating well maintaining her weight at this time.    Observations/Objective: - Patient was alert and oriented over the phone and in no distress - Physical assessment deferred due to telephone visit.  Assessment and Plan: 1.  Iron deficiency anemia: - Likely due to from chronic GI blood loss from gastric and small bowel AVMs, patient was on aspirin and Plavix. -Plavix was discontinued in October 2019.  She does continue taking her aspirin 81 mg daily -She has a history of receiving multiple blood transfusions. -She denies any bleeding per rectum or melena.  She does complain of fatigue. - Her last Feraheme infusion was on 10/17/2018 and 10/24/2018. - She had labs on 03/25/2019 showed her hemoglobin holding steady at  13.8. -She has normally been getting iron infusions every 3 months.   -We will see her back in 2 months with repeat labs.  2.  Leukocytosis and thrombocytosis: - Her Batra count has been elevated since 2004, predominantly neutrophils.  She also has had a elevated platelet count. - Her blood work was evaluated and BCR/ABL by FISH and Jak 2 V6 17 F testing were negative ruling out myeloproliferative disorders. -Labs on 03/25/2019 showed her WBC at 10.7 and her platelets are at 468. -We will continue to monitor.  3.  B12 deficiency: - Labs on 03/25/2019 showed her B12 levels at 544. -She has been getting monthly B12 injections. -We will continue to monitor her levels.  4.  Newly diagnosed diabetes: - Her blood sugars were found to be over 500. - Her PCP placed her on metformin 500 mg twice daily. - She reports the lowest she seen her blood sugar was 213. - She reports when her blood sugars are in the 200s she feels fatigued and dizzy. -Her last hemoglobin A1c was 9.6 -She will follow-up with her PCP in a few weeks.  Follow Up Instructions: -She will follow-up in 2 months with repeat labs. -She will start taking vitamin D twice daily.   I discussed the assessment and treatment plan with the patient. The patient was provided an opportunity to ask questions and all were answered. The patient agreed with the plan and demonstrated an understanding of the instructions.   The patient was advised to call back or seek an in-person evaluation if the symptoms worsen or if the condition fails to improve as anticipated.  I provided  20 minutes of non-face-to-face time during this encounter. Patient did not have an option of video chat.  Glennie Isle, NP-C

## 2019-04-01 NOTE — Assessment & Plan Note (Signed)
1.  Iron deficiency anemia: - Likely due to from chronic GI blood loss from gastric and small bowel AVMs, patient was on aspirin and Plavix. -Plavix was discontinued in October 2019.  She does continue taking her aspirin 81 mg daily -She has a history of receiving multiple blood transfusions. -She denies any bleeding per rectum or melena.  She does complain of fatigue. - Her last Feraheme infusion was on 10/17/2018 and 10/24/2018. - She had labs on 01/21/2019 showed her hemoglobin 13.7, ferritin 114, and percent saturation 22. -She has normally been getting iron infusions every 3 months.  Her hemoglobin ferritin are higher today.  We will spot check her labs in 1 month. -We will see her back in 2 months with repeat labs.  2.  Leukocytosis and thrombocytosis: - Her [place] count has been elevated since 2004, predominantly neutrophils.  She also has had a elevated platelet count. - Her blood work was evaluated and BCR/ABL by FISH and Jak 2 V6 17 F testing were negative ruling out myeloproliferative disorders. -Labs on 01/21/2019 showed her WBC at 13.1 and platelets at 352. -We will continue to monitor.  3.  B12 deficiency: - Labs on 01/21/2019 showed her B12 level at 900. -She has been getting monthly B12 injections. -We will continue to monitor her levels.  4.  Newly diagnosed diabetes: - Her blood sugars were found to be over 500. - Her PCP placed her on metformin 500 mg twice daily. - She reports the lowest she seen her blood sugar was 213. - She reports when her blood sugars are in the 200s she feels fatigued and dizzy. -She will follow-up with her PCP in a few weeks.

## 2019-04-02 ENCOUNTER — Telehealth: Payer: Self-pay | Admitting: Family Medicine

## 2019-04-02 NOTE — Telephone Encounter (Signed)
Patient called with concerns about Libre 14 day Freestyle system. Patient states that she is having trouble getting blood from her fingers so she wants to know if this could be done for the patient. Please advise

## 2019-04-03 NOTE — Telephone Encounter (Signed)
LVM for the pt to call back.

## 2019-04-08 NOTE — Telephone Encounter (Signed)
Spoke with pt and also spoke with [employer]. Their faxing over some paperwork to be filled out will complete this and fax over and let pt know. thanks

## 2019-04-08 NOTE — Telephone Encounter (Signed)
Left Pt VM to call back.

## 2019-04-09 ENCOUNTER — Encounter: Payer: Self-pay | Admitting: Family Medicine

## 2019-04-09 ENCOUNTER — Ambulatory Visit: Payer: PPO | Admitting: Pulmonary Disease

## 2019-04-11 ENCOUNTER — Other Ambulatory Visit: Payer: Self-pay

## 2019-04-11 ENCOUNTER — Telehealth: Payer: Self-pay | Admitting: Family Medicine

## 2019-04-11 DIAGNOSIS — E1165 Type 2 diabetes mellitus with hyperglycemia: Secondary | ICD-10-CM

## 2019-04-11 MED ORDER — FREESTYLE LIBRE SENSOR SYSTEM MISC: 0 refills | Status: DC

## 2019-04-11 NOTE — Telephone Encounter (Signed)
Spoke with [facility] care. Let them know that I did receive the fax but the pt decided to [facility] another pharmacy that had the Velma in stock that they could get today. Thanks

## 2019-04-11 NOTE — Telephone Encounter (Signed)
Manon Hilding is calling from [facility] is calling to see if a fax was received for Medical necessity for sensors. Please advise. [phone] option 4 documentation dept

## 2019-04-11 NOTE — Progress Notes (Signed)
Resent Libre to [place] as requested.

## 2019-04-11 NOTE — Telephone Encounter (Signed)
Sent rx to [facility] as directed by pt husband. The [facility] had [facility] stock.

## 2019-04-22 ENCOUNTER — Encounter: Payer: Self-pay | Admitting: Pulmonary Disease

## 2019-04-22 ENCOUNTER — Other Ambulatory Visit: Payer: Self-pay

## 2019-04-22 ENCOUNTER — Ambulatory Visit (INDEPENDENT_AMBULATORY_CARE_PROVIDER_SITE_OTHER): Payer: PPO | Admitting: Pulmonary Disease

## 2019-04-22 VITALS — BP 138/76 | HR 78 | Temp 98.7°F | Ht 65.0 in | Wt 156.6 lb

## 2019-04-22 DIAGNOSIS — R0609 Other forms of dyspnea: Secondary | ICD-10-CM | POA: Diagnosis not present

## 2019-04-22 DIAGNOSIS — E1140 Type 2 diabetes mellitus with diabetic neuropathy, unspecified: Secondary | ICD-10-CM | POA: Diagnosis not present

## 2019-04-22 DIAGNOSIS — Z720 Tobacco use: Secondary | ICD-10-CM

## 2019-04-22 DIAGNOSIS — J449 Chronic obstructive pulmonary disease, unspecified: Secondary | ICD-10-CM | POA: Diagnosis not present

## 2019-04-22 DIAGNOSIS — F17210 Nicotine dependence, cigarettes, uncomplicated: Secondary | ICD-10-CM | POA: Diagnosis not present

## 2019-04-22 MED ORDER — ALBUTEROL SULFATE HFA 108 (90 BASE) MCG/ACT IN AERS: INHALATION_SPRAY | RESPIRATORY_TRACT | 3 refills | Status: DC

## 2019-04-22 MED ORDER — NICOTINE POLACRILEX 4 MG MT LOZG: 4.0000 mg | LOZENGE | OROMUCOSAL | 3 refills | Status: DC | PRN

## 2019-04-22 MED ORDER — TRELEGY ELLIPTA 100-62.5-25 MCG/INH IN AEPB: 1.0000 | INHALATION_SPRAY | Freq: Every day | RESPIRATORY_TRACT | 0 refills | Status: DC

## 2019-04-22 NOTE — Progress Notes (Addendum)
'@Patient'  ID: Tammy Mcpherson, adult    DOB: 02-Apr-1956, 63 y.o.   MRN: [id number]  Chief Complaint  Patient presents with  . Follow-up    COPD follow-up, still smoking    Referring provider: [name], PA*  HPI:  63 year old female current every day smoker followed in our office for COPD  PMH: Hypertension, anemia, hyperlipidemia, gastric AVM, PAD, vitamin D deficiency, vitamin B12 deficiency, prediabetes Smoker/ Smoking History: Current Everyday Smoker.  Smoking 1-2 cigarettes per day.  44-pack-year smoking history Maintenance: Symbicort 160, Spiriva Respimat 1.25 Pt of: Dr. [name]   04/22/2019  - Visit   63 year old female current every day smoker (smoking 1 to 2 cigarettes a day) currently trying to stop smoking.  Patient is presenting today for a follow-up visit for management of her COPD.  Patient reports that overall her breathing has been doing better.  Patient recently has enrolled in the lung cancer screening program and recent low-dose CT was a lung RADS 1 with plans to repeat next year.  Patient is currently maintained on Symbicort 160 and Spiriva Respimat 1.25.  Patient reports that this does manage her breathing well.  She still knows that she needs to actively work on stopping smoking.  She has been down to 1 to 2 cigarettes a day for the last 1 to 2 months.  She never started Chantix to help her with stopping smoking based off of the reported side effects.  Patient never started the medication to see if she would have the side effects.  She is willing to consider nicotine replacement therapies to help her get to 0 cigarettes daily.  Patient does have concerns regarding her inhalers as they are $30 each a month and this cost is high to her.  She is wondering if there is any other options that are available to her.  MMRC - Breathlessness Score 1 - I get short of breath when hurrying on level ground or walking up a slight hill     Tests:   10/08/2018-CBC with  differential-eosinophils relative 1, eosinophils absolute 0.2  04/30/2016-CT angios chest- cardiomegaly with interstitial pulmonary edema very small bilateral pleural effusions, no pulmonary emboli identified  12/04/2018-CT chest lung cancer screening- biapical pleural-parenchymal scarring, mild centrilobular emphysema, lung RADS 1  03/20/2018-echocardiogram-LV ejection fraction 65 to 02%, grade 1 diastolic dysfunction, mild LVH  11/01/2018- pulmonary function test-FVC 2.42 (76% predicted), postbronchodilator ratio 74, postbronchodilator FEV1 1.52 (62% predicted), no significant bronchodilator response, DLCO was 51 >>>Patient did take Spiriva prior to testing >>>Patient has not taken Symbicort today  FENO:  No results found for: NITRICOXIDE  PFT: PFT Results Latest Ref Rng & Units 11/01/2018  FVC-Predicted Pre % 76  FVC-Post L 2.07  FVC-Predicted Post % 65  Pre FEV1/FVC % % 65  Post FEV1/FCV % % 74  FEV1-Pre L 1.57  FEV1-Predicted Pre % 64  FEV1-Post L 1.52  DLCO UNC% % 51  DLCO COR %Predicted % 65  TLC L 4.12  TLC % Predicted % 84  RV % Predicted % 90    Imaging: Dg Foot Complete Left  Result Date: 03/28/2019 CLINICAL DATA:  Left foot pain history of amputation EXAM: LEFT FOOT - COMPLETE 3+ VIEW COMPARISON:  06/13/2017 FINDINGS: No acute displaced fracture. Marked hallux valgus deformity at the first MTP joint with moderate bunion formation. Lateral subluxation base of first proximal phalanx with respect to head of first metatarsal. 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 No osseous  erosion. No bone destruction. Old fracture deformity distal third metacarpal. IMPRESSION: 1. Prior amputation of the second and fifth digits at the level of the MCP joints. No acute osseous abnormality. 2. Marked hallux valgus deformity at the first MTP joint with associated degenerative change. Electronically Signed   By: Donavan Foil M.D.   On: 03/28/2019 16:39       Specialty Problems      Pulmonary Problems   Chronic obstructive pulmonary disease ([place])    11/01/2018- pulmonary function test-FVC 2.42 (76% predicted), postbronchodilator ratio 74, postbronchodilator FEV1 1.52 (62% predicted), no significant bronchodilator response, DLCO was 51 >>>Patient did take Spiriva prior to testing >>>Patient has not taken Symbicort today  12/04/2018-CT chest lung cancer screening- biapical pleural-parenchymal scarring, mild centrilobular emphysema, lung RADS 1      Dyspnea on effort      Allergies  Allergen Reactions  . Bee Venom Swelling  . Codeine Anaphylaxis    Tongue swelled  . Penicillins Swelling and Rash    Has patient had a PCN reaction causing immediate rash, facial/tongue/throat swelling, SOB or lightheadedness with hypotension: No, delayed Has patient had a PCN reaction causing severe rash involving mucus membranes or skin necrosis: No Has patient had a PCN reaction that required hospitalization No Has patient had a PCN reaction occurring within the last 10 years: No If all of the above answers are "NO", then may proceed with Cephalosporin use.   . Bupropion Other (See Comments)    "Made my skin crawl"  . Sudafed [Pseudoephedrine Hcl] Rash    Immunization History  Administered Date(s) Administered  . Influenza,inj,Quad PF,6+ Mos 07/04/2017, 07/10/2018  . Pneumococcal Polysaccharide-23 11/01/2018    Past Medical History:  Diagnosis Date  . Anemia 04/30/2016  . Anxiety   . Blood transfusion without reported diagnosis   . Cardiomyopathy ([place])    a. EF 40-45% by echo in 04/2016 b. Improved to 60-65% by repeat imaging in 2018  . CHF (congestive heart failure) ([place])   . COPD (chronic obstructive pulmonary disease) ([place])   . Coronary artery calcification seen on CT scan   . Essential hypertension   . GERD (gastroesophageal reflux disease)   . History of bronchitis   . History of GI bleed   . Hyperlipidemia   . Iron deficiency anemia  05/12/2016   Bleeding ulcers  . Pollen allergy   . PSVT (paroxysmal supraventricular tachycardia) ([place])   . PVC's (premature ventricular contractions)     Tobacco History: Social History   Tobacco Use  Smoking Status Current Every Day Smoker  . Packs/day: 1.00  . Years: 44.00  . Pack years: 44.00  . Types: Cigarettes  . Start date: 05/10/1975  Smokeless Tobacco Never Used  Tobacco Comment   pack per day 1.15.20   Ready to quit: Not Answered Counseling given: Not Answered Comment: pack per day 1.15.20   Smoking assessment and cessation counseling  Patient currently smoking: 1 to 2 cigarettes daily I have advised the patient to quit/stop smoking as soon as possible due to high risk for multiple medical problems.  It will also be very difficult for [place] to manage patient's  respiratory symptoms and status if we continue to expose her lungs to a known irritant.  We do not advise e-cigarettes as a form of stopping smoking.  Patient is willing to quit smoking.  Patient needs to set a quit date.  I have advised the patient that we can assist and have options of nicotine replacement therapy, provided  smoking cessation education today, provided smoking cessation counseling, and provided cessation resources.  Previous reaction to Wellbutrin, never started Chantix, is willing to consider nicotine replacement therapies.  Patient agrees to prescription for 4 mg nicotine lozenges.  Patient needs cigarette within the first 30 minutes of waking up with her morning coffee.  Follow-up next office visit office visit for assessment of smoking cessation.    Smoking cessation counseling advised for: 4 min     Outpatient Encounter Medications as of 04/22/2019  Medication Sig  . acetaminophen (TYLENOL) 500 MG tablet Take 500 mg by mouth at bedtime as needed for moderate pain.  Marland Kitchen albuterol (PROAIR HFA) 108 (90 Base) MCG/ACT inhaler INHALE 2 PUFFS INTO THE LUNGS EVERY 6 HOURS AS NEEDED FOR WHEEZING OR  SHORTNESS OF BREATH.  Marland Kitchen albuterol (PROVENTIL) (2.5 MG/3ML) 0.083% nebulizer solution Take 3 mLs (2.5 mg total) by nebulization every 4 (four) hours as needed for wheezing or shortness of breath.  Marland Kitchen aspirin 81 MG chewable tablet Chew 81 mg by mouth every morning.   Marland Kitchen atorvastatin (LIPITOR) 20 MG tablet TAKE ONE TABLET (20MG TOTAL) BY MOUTH DAILY (Patient taking differently: Take 20 mg by mouth every morning. )  . bisoprolol (ZEBETA) 10 MG tablet Take 1 tablet (10 mg total) by mouth daily.  . Blood Glucose Monitoring Suppl (ONE TOUCH ULTRA 2) w/Device KIT   . budesonide-formoterol (SYMBICORT) 160-4.5 MCG/ACT inhaler Inhale 2 puffs into the lungs every 12 (twelve) hours. (Patient taking differently: Inhale 2 puffs into the lungs 2 (two) times daily as needed (for shortness of breath). )  . Continuous Blood Gluc Sensor (FREESTYLE LIBRE SENSOR SYSTEM) MISC Use as directed  . cyanocobalamin (,VITAMIN B-12,) 1000 MCG/ML injection Inject 1 mL (1,000 mcg total) into the muscle every 30 (thirty) days.  Marland Kitchen gabapentin (NEURONTIN) 300 MG capsule Take 1 capsule (300 mg total) by mouth at bedtime.  Marland Kitchen glucose blood (ONETOUCH ULTRA) test strip Use as instructed  . glucose monitoring kit (FREESTYLE) monitoring kit Use to check blood sugar BID as directed  . Lancets (ONETOUCH DELICA PLUS [id number]) MISC 1 each by Does not apply route 2 (two) times a day.  . losartan (COZAAR) 100 MG tablet TAKE ONE (1) TABLET BY MOUTH EVERY DAY (Patient taking differently: Take 100 mg by mouth every morning. TAKE ONE (1) TABLET BY MOUTH EVERY DAY)  . metFORMIN (GLUCOPHAGE XR) 500 MG 24 hr tablet Take 2 tablets (1,000 mg total) by mouth daily with breakfast.  . Misc. Devices MISC Please provide supplies (needle, syringe, alcohol swabs) needed for patient to self-administer B-12 injections monthly.  . pantoprazole (PROTONIX) 40 MG tablet TAKE ONE (1) TABLET BY MOUTH EVERY DAY (Patient taking differently: Take 40 mg by mouth every morning.  TAKE ONE (1) TABLET BY MOUTH EVERY DAY)  . Tiotropium Bromide Monohydrate (SPIRIVA RESPIMAT) 1.25 MCG/ACT AERS Inhale 2 puffs into the lungs daily. (Patient taking differently: Inhale 1 puff into the lungs 2 (two) times a day. )  . varenicline (CHANTIX PAK) 0.5 MG X 11 & 1 MG X 42 tablet Take one 0.5 mg tablet by mouth once daily for 3 days, then increase to one 0.5 mg tablet twice daily for 4 days, then increase to one 1 mg tablet twice daily.  . [DISCONTINUED] albuterol (PROAIR HFA) 108 (90 Base) MCG/ACT inhaler INHALE 2 PUFFS INTO THE LUNGS EVERY 6 HOURS AS NEEDED FOR WHEEZING OR SHORTNESS OF BREATH.  Marland Kitchen Fluticasone-Umeclidin-Vilant (TRELEGY ELLIPTA) 100-62.5-25 MCG/INH AEPB Inhale 1 puff into the lungs  daily.  . nicotine polacrilex (COMMIT) 4 MG lozenge Take 1 lozenge (4 mg total) by mouth as needed for smoking cessation.   No facility-administered encounter medications on file as of 04/22/2019.      Review of Systems  Review of Systems  Constitutional: Negative for activity change, chills, fatigue, fever and unexpected weight change.  HENT: Positive for congestion. Negative for rhinorrhea, sinus pressure, sinus pain and sore throat.   Eyes: Negative.   Respiratory: Positive for shortness of breath. Negative for cough and wheezing.   Cardiovascular: Negative for chest pain and palpitations.  Gastrointestinal: Negative for diarrhea, nausea and vomiting.  Endocrine: Negative.   Genitourinary: Negative.   Musculoskeletal: Negative.   Skin: Negative.   Neurological: Positive for numbness (Patient reports she has numbness in her toes as well as fingertips). Negative for dizziness and headaches.  Psychiatric/Behavioral: Negative.  Negative for dysphoric mood. The patient is not nervous/anxious.   All other systems reviewed and are negative.    Physical Exam  BP 138/76 (BP Location: Left Arm, Patient Position: Sitting, Cuff Size: Normal)   Pulse 78   Temp 98.7 F (37.1 C)   Ht '5\' 5"'   (1.651 m)   Wt 156 lb 9.6 oz (71 kg)   SpO2 96%   BMI 26.06 kg/m   Wt Readings from Last 5 Encounters:  04/22/19 156 lb 9.6 oz (71 kg)  01/30/19 154 lb 14.4 oz (70.3 kg)  01/24/19 158 lb (71.7 kg)  01/21/19 160 lb (72.6 kg)  12/18/18 161 lb 3.2 oz (73.1 kg)     Physical Exam Vitals signs and nursing note reviewed.  Constitutional:      General: She is not in acute distress.    Appearance: Normal appearance.  HENT:     Head: Normocephalic and atraumatic.     Right Ear: Tympanic membrane, ear canal and external ear normal. There is no impacted cerumen.     Left Ear: Tympanic membrane, ear canal and external ear normal. There is no impacted cerumen.     Nose: Nose normal. No congestion.     Mouth/Throat:     Mouth: Mucous membranes are moist.     Dentition: Abnormal dentition.     Pharynx: Oropharynx is clear.  Eyes:     Pupils: Pupils are equal, round, and reactive to light.  Neck:     Musculoskeletal: Normal range of motion.  Cardiovascular:     Rate and Rhythm: Normal rate and regular rhythm.     Pulses: Normal pulses.     Heart sounds: Normal heart sounds. No murmur.  Pulmonary:     Effort: Pulmonary effort is normal. No respiratory distress.     Breath sounds: Normal breath sounds. No decreased air movement. No decreased breath sounds, wheezing or rales.  Skin:    General: Skin is warm and dry.     Capillary Refill: Capillary refill takes less than 2 seconds.  Neurological:     General: No focal deficit present.     Mental Status: She is alert and oriented to person, place, and time. Mental status is at baseline.     Gait: Gait normal.  Psychiatric:        Mood and Affect: Mood normal.        Behavior: Behavior normal.        Thought Content: Thought content normal.        Judgment: Judgment normal.      Lab Results:  CBC    Component Value Date/Time  WBC 14.7 (H) 03/25/2019 1219   RBC 4.58 03/25/2019 1219   HGB 13.8 03/25/2019 1219   HCT 43.0  03/25/2019 1219   PLT 468 (H) 03/25/2019 1219   MCV 93.9 03/25/2019 1219   MCH 30.1 03/25/2019 1219   MCHC 32.1 03/25/2019 1219   RDW 13.3 03/25/2019 1219   LYMPHSABS 1.7 03/25/2019 1219   MONOABS 1.4 (H) 03/25/2019 1219   EOSABS 0.2 03/25/2019 1219   BASOSABS 0.1 03/25/2019 1219    BMET    Component Value Date/Time   NA 139 03/25/2019 1219   NA 143 02/28/2018 1732   K 4.0 03/25/2019 1219   CL 105 03/25/2019 1219   CO2 21 (L) 03/25/2019 1219   GLUCOSE 200 (H) 03/25/2019 1219   BUN 13 03/25/2019 1219   BUN 10 02/28/2018 1732   CREATININE 0.70 03/25/2019 1219   CREATININE 0.73 09/28/2017 1201   CALCIUM 9.3 03/25/2019 1219   GFRNONAA >60 03/25/2019 1219   GFRNONAA 90 09/28/2017 1201   GFRAA >60 03/25/2019 1219   GFRAA 104 09/28/2017 1201    BNP    Component Value Date/Time   BNP 446.0 (H) 04/30/2016 0719    ProBNP No results found for: PROBNP    Assessment & Plan:   Tobacco abuse Assessment: 33-pack-year smoking history Current everyday smoker, 1 to 2 cigarettes daily Previous reaction to Wellbutrin, patient never started trial of Chantix, patient has interested in nicotine replacement therapies Lung cancer screening CT in 2020 showing lung RADS 1, repeat in 1 year  Plan: Patient needs to set a quit date within the next 4 weeks Prescription for 4 mg nicotine lozenges sent to pharmacy for patient to use in place of cigarettes to successfully get to quit date Repeat lung cancer screening CT in 2021 Follow-up in 3 months, contact her office sooner if you are unsuccessful with stopping smoking or relapse  Chronic obstructive pulmonary disease ([place]) Plan: Trial of Trelegy Ellipta inhaler Stop Symbicort 160, stop Spiriva Respimat 1.25 Complete lung cancer screening CT in March/2021 as outlined Emphasized again the importance with the patient to stop smoking [facility] of [facility] as well as assistance with inhaler cost  Follow-up in 3 months  Neuropathy due to type 2 diabetes mellitus ([place]) Suspect patient's current tingling in fingers as well as toes is likely diabetic neuropathy.  Patient is already managed on gabapentin.  Plan: Contact primary care and discuss your current symptoms May need to consider increase gabapentin Ensure blood sugars are well managed as elevated blood sugars can worsen her neuropathy    Return in about 3 months (around 07/23/2019), or if symptoms worsen or fail to improve, for Follow up with Dr. [name].   Lauraine Rinne, NP 04/22/2019   This appointment was 26 minutes long with over 50% of the time in direct face-to-face patient care, assessment, plan of care, and follow-up.

## 2019-04-22 NOTE — Assessment & Plan Note (Signed)
Plan: Trial of Trelegy Ellipta inhaler Stop Symbicort 160, stop Spiriva Respimat 1.25 Complete lung cancer screening CT in March/2021 as outlined Emphasized again the importance with the patient to stop smoking [facility] of COPD as well as assistance with inhaler cost Follow-up in 3 months

## 2019-04-22 NOTE — Patient Instructions (Addendum)
Trial of Trelegy Ellipta  >>> 1 puff daily in the morning >>>rinse mouth out after use  >>> This inhaler contains 3 medications that help manage her respiratory status, contact our office if you cannot afford this medication or unable to remain on this medication  Call me in a week and let me know how you are doing on Trelegy Ellipta   STOP symbicort 160 and Spiriva Resp 1.25  Albuterol every 4-6 hours as needed for shortness of breath or wheezing    Note your daily symptoms >remember "red flags" for COPD:  >>>Increase in cough >>>increase in sputum production >>>increase in shortness of breath or activity  intolerance.   If you notice these symptoms, please call the office to be seen.     We recommend that you stop smoking.  >>>You need to set a quit date >>>If you have friends or family who smoke, let them know you are trying to quit and not to smoke around you or in your living environment  Smoking Cessation Resources:  1 800 QUIT NOW  >>> Patient to call this resource and utilize it to help support her quit smoking >>> Keep up your hard work with stopping smoking  You can also contact the [facility] >>>For smoking cessation classes call [phone]  We do not recommend using e-cigarettes as a form of stopping smoking  You can sign up for smoking cessation support texts and information:  >>>https://smokefree.gov/smokefreetxt   >>>Avoid acidic beverages such as coffee, carbonated beverages before and during gum/lozenges use.  A soft acidic beverages lower oral pH which cause nicotine to not be absorbed properly >>>If you chew the gum too quickly or vigorously you could have nausea, vomiting, abdominal pain, constipation, hiccups, headache, sore jaw, mouth irritation ulcers  Nicotine lozenge: Lozenges are commonly uses short acting NRT product  >>>Smokers who smoke within 30 minutes of awakening should use 4 mg dose >>>Smokers who wait more  than 30 minutes after awakening to smoke should use 2 mg dose  Can use up to 1 lozenge every 1-2 hours for 6 weeks >>>Total amount of lozenges that can be used per day as 20 >>>Gradually reduce number of lozenges used per day after 2 weeks of use  Place lozenge in mouth and allowed to dissolve for 30 minutes loss and does not need to be chewed  Lozenges have advantages to be able to be used in people with TMG, poor dentition, dentures    Return in about 3 months (around 07/23/2019), or if symptoms worsen or fail to improve, for Follow up with Dr. [name].   Coronavirus (COVID-19) Are you at risk?  Are you at risk for the Coronavirus (COVID-19)?  To be considered HIGH RISK for Coronavirus (COVID-19), you have to meet the following criteria:  . Traveled to [place], [place], [place], [place] or [place]; or in the [place] to [place], [place], [place], or [place]; and have fever, cough, and shortness of breath within the last 2 weeks of travel OR . Been in close contact with a person diagnosed with COVID-19 within the last 2 weeks and have fever, cough, and shortness of breath . IF YOU DO NOT MEET THESE CRITERIA, YOU ARE CONSIDERED LOW RISK FOR COVID-19.  What to do if you are HIGH RISK for COVID-19?  Marland Kitchen If you are having a medical emergency, call 911. . Seek medical care right away. Before you go to a doctor's office, urgent care or emergency department, call  ahead and tell them about your recent travel, contact with someone diagnosed with COVID-19, and your symptoms. You should receive instructions from your physician's office regarding next steps of care.  . When you arrive at healthcare provider, tell the healthcare staff immediately you have returned from visiting [place], [place], [place], [place] or [place]; or traveled in the [place] to [place], [place], [place], or [place]; in the last two weeks or you have been in close contact with a person diagnosed with  COVID-19 in the last 2 weeks.   . Tell the health care staff about your symptoms: fever, cough and shortness of breath. . After you have been seen by a medical provider, you will be either: o Tested for (COVID-19) and discharged home on quarantine except to seek medical care if symptoms worsen, and asked to  - Stay home and avoid contact with others until you get your results (4-5 days)  - Avoid travel on public transportation if possible (such as bus, train, or airplane) or o Sent to the Emergency Department by EMS for evaluation, COVID-19 testing, and possible admission depending on your condition and test results.  What to do if you are LOW RISK for COVID-19?  Reduce your risk of any infection by using the same precautions used for avoiding the common cold or flu:  Marland Kitchen Wash your hands often with soap and warm water for at least 20 seconds.  If soap and water are not readily available, use an alcohol-based hand sanitizer with at least 60% alcohol.  . If coughing or sneezing, cover your mouth and nose by coughing or sneezing into the elbow areas of your shirt or coat, into a tissue or into your sleeve (not your hands). . Avoid shaking hands with others and consider head nods or verbal greetings only. . Avoid touching your eyes, nose, or mouth with unwashed hands.  . Avoid close contact with people who are sick. . Avoid places or events with large numbers of people in one location, like concerts or sporting events. . Carefully consider travel plans you have or are making. . If you are planning any travel outside or inside the [place], visit the CDC's Travelers' Health webpage for the latest health notices. . If you have some symptoms but not all symptoms, continue to monitor at home and seek medical attention if your symptoms worsen. . If you are having a medical emergency, call 911.   [facility] / e-Visit:  [email or web address]         [facility]: [facility]: [phone]                   [facility]: [phone]           It is flu season:   >>> Best ways to protect herself from the flu: Receive the yearly flu vaccine, practice good hand hygiene washing with soap and also using hand sanitizer when available, eat a nutritious meals, get adequate rest, hydrate appropriately   Please contact the office if your symptoms worsen or you have concerns that you are not improving.   Thank you for choosing [facility] for your healthcare, and for allowing [place] to partner with you on your healthcare journey. I am thankful to be able to provide care to you today.   [name] FNP-C

## 2019-04-22 NOTE — Assessment & Plan Note (Signed)
Assessment: 33-pack-year smoking history Current everyday smoker, 1 to 2 cigarettes daily Previous reaction to Wellbutrin, patient never started trial of Chantix, patient has interested in nicotine replacement therapies Lung cancer screening CT in 2020 showing lung RADS 1, repeat in 1 year  Plan: Patient needs to set a quit date within the next 4 weeks Prescription for 4 mg nicotine lozenges sent to pharmacy for patient to use in place of cigarettes to successfully get to quit date Repeat lung cancer screening CT in 2021 Follow-up in 3 months, contact her office sooner if you are unsuccessful with stopping smoking or relapse

## 2019-04-22 NOTE — Assessment & Plan Note (Signed)
Suspect patient's current tingling in fingers as well as toes is likely diabetic neuropathy.  Patient is already managed on gabapentin.  Plan: Contact primary care and discuss your current symptoms May need to consider increase gabapentin Ensure blood sugars are well managed as elevated blood sugars can worsen her neuropathy

## 2019-04-23 ENCOUNTER — Telehealth: Payer: Self-pay | Admitting: Pulmonary Disease

## 2019-04-23 DIAGNOSIS — J449 Chronic obstructive pulmonary disease, unspecified: Secondary | ICD-10-CM

## 2019-04-23 NOTE — Telephone Encounter (Signed)
04/23/2019 0901  Tammy Mcpherson, and Rachael,   Patient was seen in office yesterday.  Patient was placed on a trial of Trelegy Ellipta.  Sample was provided.  Patient had concerns regarding cost of Symbicort 160 as well as Spiriva Respimat.  She reports that her out-of-pocket cost was $30 for each inhaler.  Likely patient's cost for Trelegy Ellipta will also be $30.  Can we see if the patient can get qualified for any sort of patient assistance programs to help with cost of inhalers.  Ideally Trelegy Ellipta will be the inhaler that she tolerates and does well on and she may be able to qualify for [employer] for you patient assistance.  [name] FNP

## 2019-04-23 NOTE — Telephone Encounter (Signed)
Findings of benefits investigation via test claims for Trelegy Ellipta for 1 month supply :  Patient's copay is $45  Insurance: [employer]- Medicare D plan  Amber Rph will follow up with patient this Friday when she is in office.  Info needed to apply for [place] Patient Assistance:  1. Patient must meet program's Maximum monthly gross income limit: 1 < $2,658.33, 2 < $3,591.67, 3 < $4,525.00, 4 < $5458.33   For each additional person $933.33   2. Include all pages of your most recent Medicare Part D prescription drug plan statement (Explanation of Benefits - EOB) indicating you have paid a total of $600 for prescriptions in the current calendar year.  *Proof of income is not required  9:58 AM Beatriz Chancellor, CPhT

## 2019-04-25 ENCOUNTER — Other Ambulatory Visit: Payer: Self-pay | Admitting: *Deleted

## 2019-04-25 MED ORDER — TRELEGY ELLIPTA 100-62.5-25 MCG/INH IN AEPB: 1.0000 | INHALATION_SPRAY | Freq: Every day | RESPIRATORY_TRACT | 6 refills | Status: DC

## 2019-04-25 NOTE — Progress Notes (Signed)
PCCM: Thanks for seeing her.  [facility] 04/25/2019 5:12 PM

## 2019-04-25 NOTE — Telephone Encounter (Signed)
Patient is returning phone call.  Patient phone number is [phone].

## 2019-04-25 NOTE — Telephone Encounter (Signed)
Called to discuss patient assistance for Trelegy.  No answer.  Left voicemail.  Mariella Saa, PharmD, [place], [place] Clinical Specialty Pharmacist [phone]  04/25/2019 10:54 AM

## 2019-04-25 NOTE — Telephone Encounter (Signed)
Routing to Tammy Mcpherson as she was the one who had tried to reach pt in regards to patient assistance for her Trelegy.

## 2019-04-25 NOTE — Telephone Encounter (Signed)
Returned patient call. Informed patient that Trelegy would be a co-pay of $45 which would be a more cost effective alternative as she is currently paying $40 for Symbicort and $40 for Spiriva.    She has been using Trelegy samples that were provided in office for the past 2 days.  She has noticed an improvement in her symptoms and likes that it is once a day.  She would like to proceed with Trelegy.  Will send prescription to [facility] per patient request.    All questions encouraged and answered.  Instructed patient to call with any further questions or concerns.  Mariella Saa, PharmD, [place], [place] Clinical Specialty Pharmacist [phone]  04/25/2019 11:35 AM

## 2019-04-25 NOTE — Patient Outreach (Signed)
[facility]) Care Management  04/25/2019  Tammy Mcpherson July 26, 1956 [id number]   Telephone Screen  Referral Date: 04/23/19 Referral Source: MD referral Tammy Quaker NP Referral Reason: Emphysema, medication management - inhaler cost (per NP 04/22/19 notemanagement of COPD as well as assistance with inhaler cost) Insurance: HTA  Last admission on 08/22/17 Last ED visit on 01/21/19 for hyperglycemia  Outreach attempt # 1  Patient is able to verify HIPAA Reviewed and addressed referral to [facility] with patient  [facility] RN CM unable to find pt on most recent engaged [facility]/prism list    Conditions: combined systolic and diastolic HG, HTN, CAD, PAD, 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  Sample was provided from MD office .   Stopped symbicort 160 and Spirivia Respimat 1.25  Had concerns regarding cost of Symbicort 160 as well as Spiriva Respimat.  She reports that her out-of-pocket cost was $30 for each inhaler.  Likely patient's cost for Trelegy Ellipta will also be $30.   Appointments: Last virtual primary care MD visit on 03/26/19   Outreach attempt # 1 No answer. [facility] RN CM left HIPAA compliant voicemail message along with CM's contact info.   Plan: [facility] RN CM sent an unsuccessful outreach letter and scheduled this patient for another call attempt within 4 business days [facility] RN CM will  Tammy Mcpherson L. Lavina Hamman, RN, BSN, [place] Coordinator Office number [phone] Mobile number [phone]  Main [facility] number [phone] Fax number [phone]

## 2019-04-28 ENCOUNTER — Other Ambulatory Visit: Payer: Self-pay | Admitting: *Deleted

## 2019-04-28 NOTE — Patient Outreach (Addendum)
  [facility]) Care Management  04/28/2019  Tammy Mcpherson 02-09-1956 [id number]   Case closure/Referral reason resolved by MD office    Call attempts made on 04/25/19 and 04/28/19 Unsuccessful outreach letter sent on 04/25/19 without a response  Epic message received from [name], NP informing CM update that medication assistance received by the office pharmacy for this patient. Virl Cagey discussed closure of this referral for Tammy [facility] RN CM will close case- Physician request discharge from program Case closure letters sent to patient   Joelene Millin L. Lavina Hamman, RN, BSN, [place] Coordinator Office number [phone] Mobile number [phone]  Main [facility] number [phone] Fax number [phone]

## 2019-04-28 NOTE — Telephone Encounter (Signed)
Agreed.   Thank you for working with the patient.  Aaron Edelman

## 2019-04-28 NOTE — Patient Outreach (Signed)
[facility]) Care Management  04/28/2019  Tammy Mcpherson [birth year] [id number]   Telephone Screen  Referral Date: 04/23/19 Referral Source: MD referral Tammy Quaker NP Referral Reason: Emphysema, medication management - inhaler cost (per NP 04/22/19 notemanagement of COPD as well as assistance with inhaler cost) Insurance: HTA  Last admission on 08/22/17 Last ED visit on 01/21/19 for hyperglycemia  Outreach attempt # 2  Patient is able to verify HIPAA Reviewed and addressed referral to [facility] patient  [facility] RN CM unable to find pt on most recent engaged [facility]mark/prism list    Conditions: combined systolic and diastolic HG, HTN, CAD, PAD, 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 Sample was provided from MD office .  Stopped symbicort 160 and Spirivia Respimat 1.25  Had concerns regarding cost of Symbicort 160 as well as Spiriva Respimat. She reports that her out-of-pocket cost was $30 for each inhaler. Likely patient's cost for Trelegy Ellipta will also be $30.   Appointments: Last virtual primary care MD visit on 03/26/19   Outreach attempt # 1 No answer. [facility] RN CM left HIPAA compliant voicemail message along with CM's contact info.   Plan: [facility] RN CM sent an unsuccessful outreach letter on 04/25/19 [facility] RN CM left voices messages on 04/25/19 and 04/28/19 [facility] RN CM scheduled this patient for another call attempt within 4 business days  Routed note  Kimberly L. Lavina Hamman, RN, BSN, [place] Coordinator Office number [phone] Mobile number [phone]  Main [facility] number [phone] Fax number [phone]

## 2019-04-29 ENCOUNTER — Other Ambulatory Visit: Payer: Self-pay | Admitting: *Deleted

## 2019-04-29 ENCOUNTER — Ambulatory Visit: Payer: Self-pay | Admitting: *Deleted

## 2019-04-29 NOTE — Patient Outreach (Signed)
[facility]) Care Management  04/29/2019  LATECIA MILER 10-Apr-1956 [id number]   Care coordination  [facility] RN CM received a message from Mrs Strohmeier  [facility] RN CM returned a call to her  [facility] RN CM discussed the case closure per [name], NP as her medication concerns had been resolved by the office pharmacy staff. She confirms this [facility] RN CM discussed [facility] services and her upcoming HTA care management program (Prism, [facility]). She voiced understanding   Kimberly L. Lavina Hamman, RN, BSN, [place] Coordinator Office number [phone] Mobile number [phone]  Main [facility] number [phone] Fax number [phone]

## 2019-05-02 ENCOUNTER — Other Ambulatory Visit: Payer: Self-pay | Admitting: *Deleted

## 2019-05-02 NOTE — Patient Outreach (Addendum)
  [facility]) Care Management  05/02/2019  Tammy Mcpherson 05-Nov-1955 [id number]   Care coordination  [facility] RN CM received a call from [name] after the [facility] case closure letter was received for clarity [facility] RN CM explained the letter was sent to inform [name] that the initial referral from [name] had been closed as he and [name] confirmed medication assistance concern were resolved. Discussed the contact also made with [name] on 04/28/19 and [name] on 04/29/19 [name] voiced understanding. It was confirmed the letter did not state services had been d/c by [name]. Lavina Hamman, RN, BSN, [place] Coordinator Office number [phone] Mobile number [phone]  Main [facility] number [phone] Fax number [phone]

## 2019-05-07 IMAGING — CT CT CHEST LUNG CANCER SCREENING LOW DOSE W/O CM
2 of 3 series · 15 of 36 positions shown, 18 images · non-contrast
Comparison: CTA chest dated 04/30/2016

CLINICAL DATA: 62-year-old female current smoker, with 35 pack-year
history of smoking, for initial lung cancer screening

EXAM:
CT CHEST WITHOUT CONTRAST LOW-DOSE FOR LUNG CANCER SCREENING
TECHNIQUE: Multidetector CT imaging of the chest was performed following the
standard protocol without IV contrast.

[Series 2: thorax 5.0 i31f 3 · axial · 0.63mm/px · z∈[-233,-3]mm · 12 of 56 slices shown, 15 images]
[im 5/56  mediastinal]
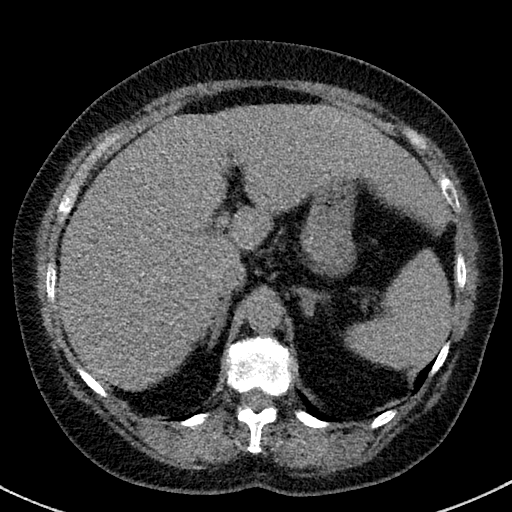
[im 5/56  lung]
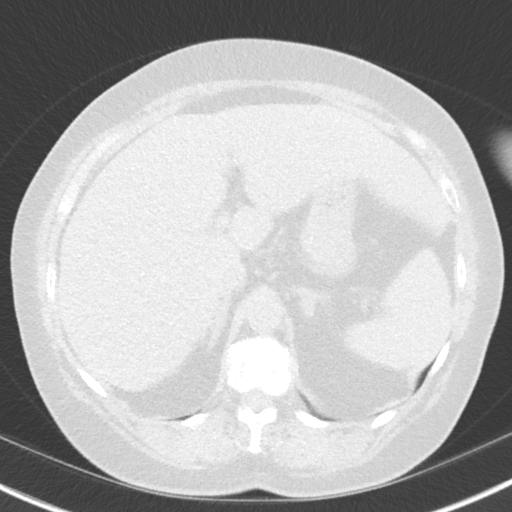
[im 9/56  lung]
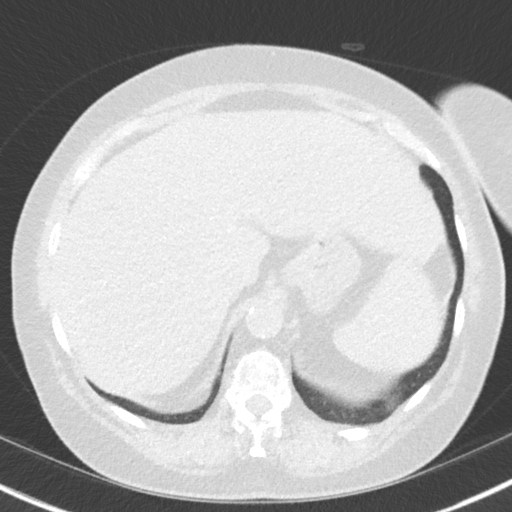
[im 13/56  lung]
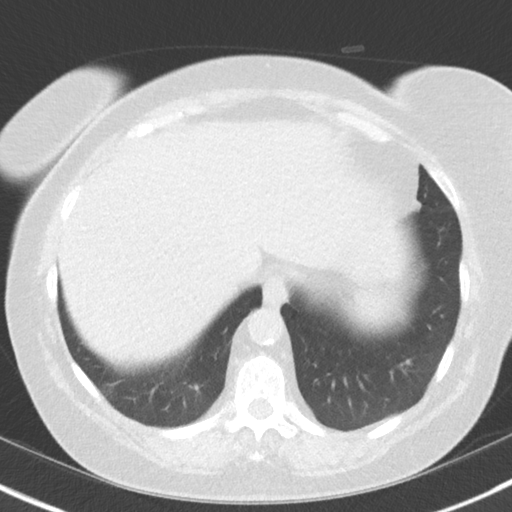
[im 17/56  lung]
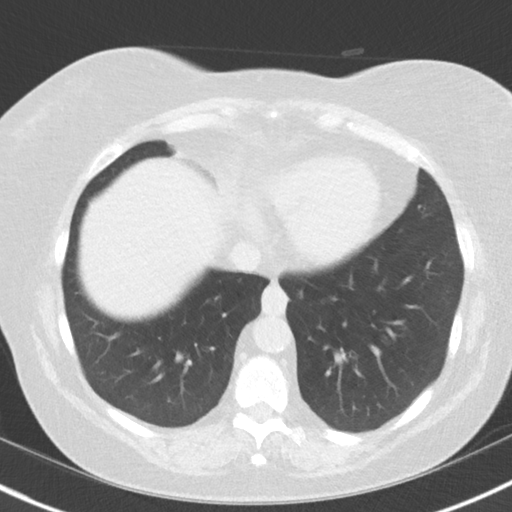
[im 21/56  mediastinal]
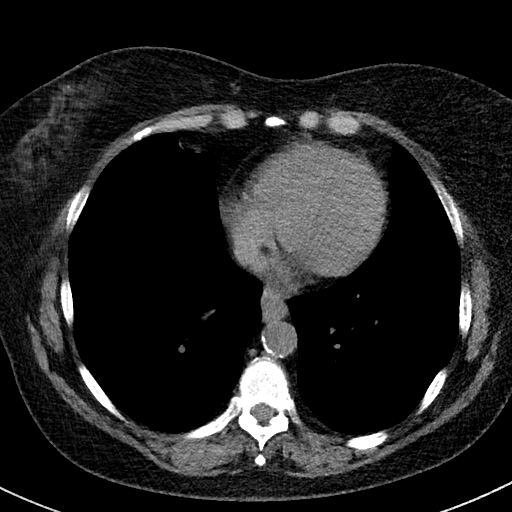
[im 21/56  lung]
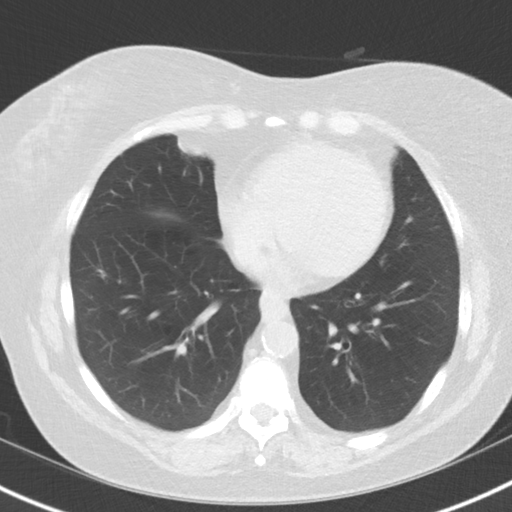
[im 25/56  lung]
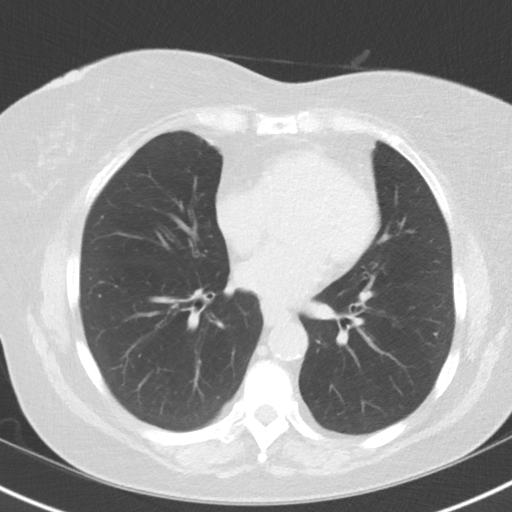
[im 31/56  lung]
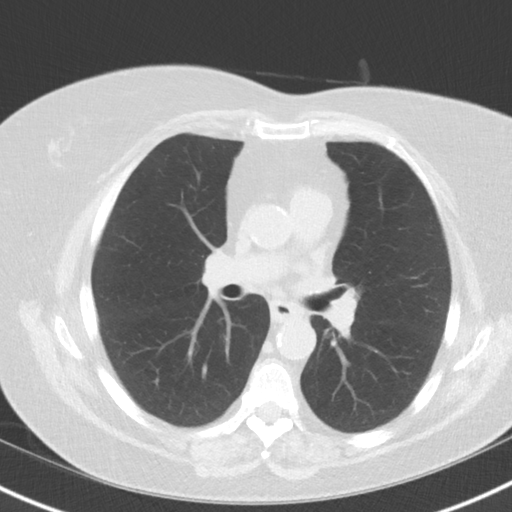
[im 35/56  lung]
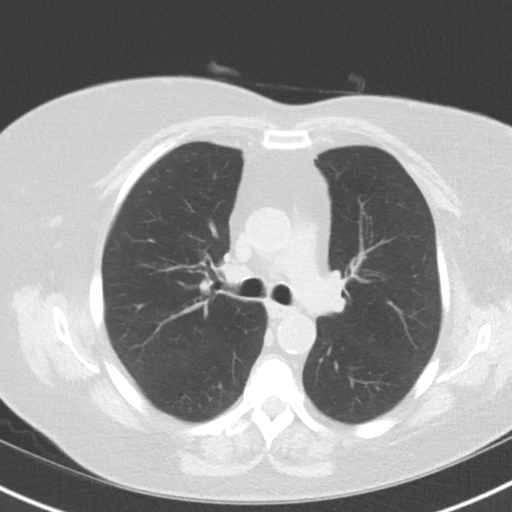
[im 39/56  mediastinal]
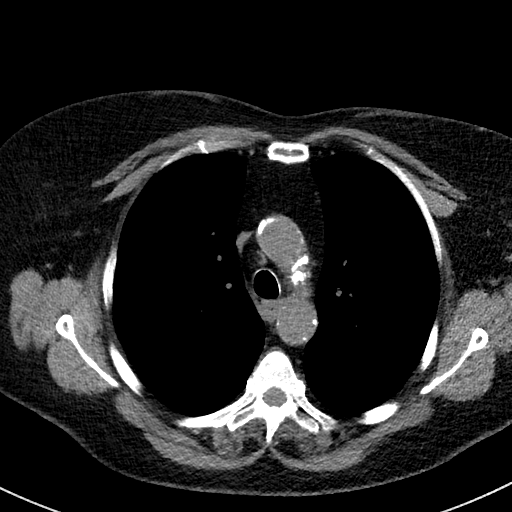
[im 39/56  lung]
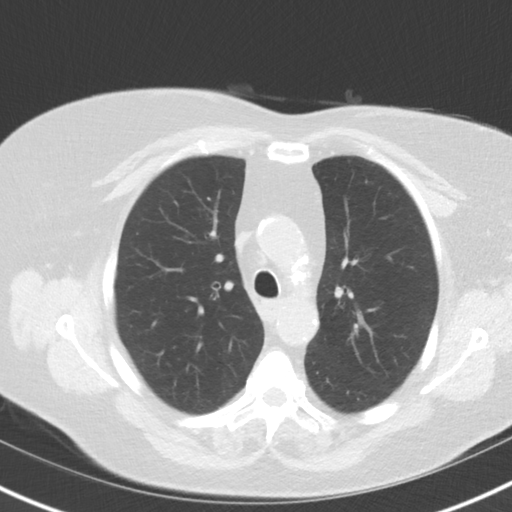
[im 43/56  lung]
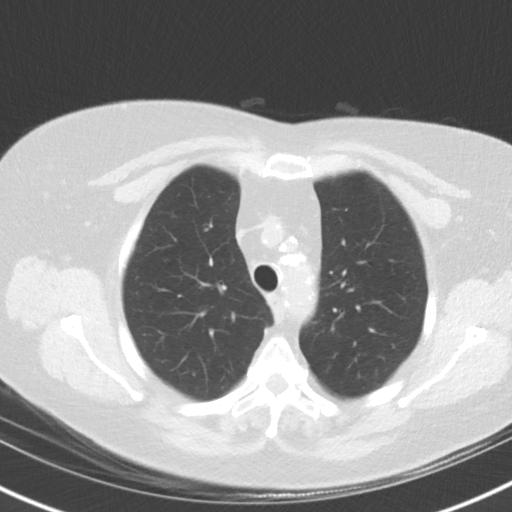
[im 47/56  lung]
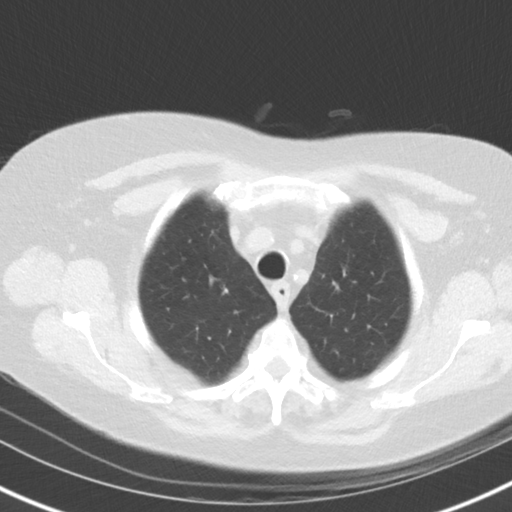
[im 51/56  lung]
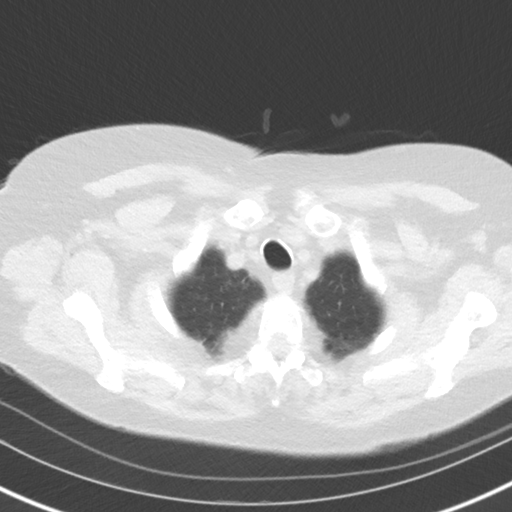

[Series 5: coronal · coronal · 0.57mm/px · 3 of 136 slices shown]
[im 28/136  lung]
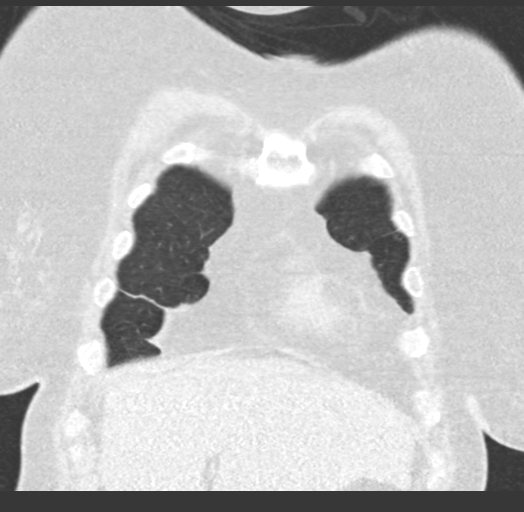
[im 55/136  lung]
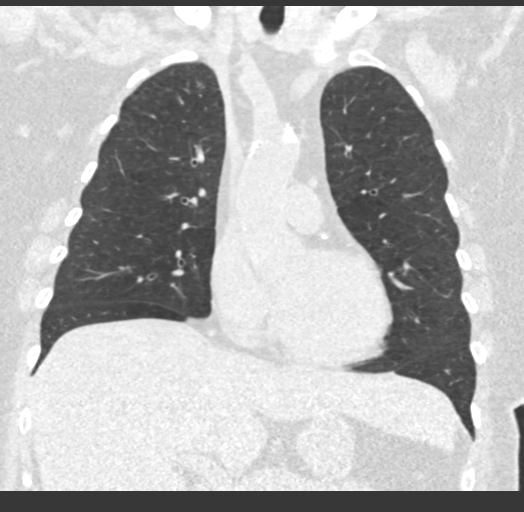
[im 82/136  lung]
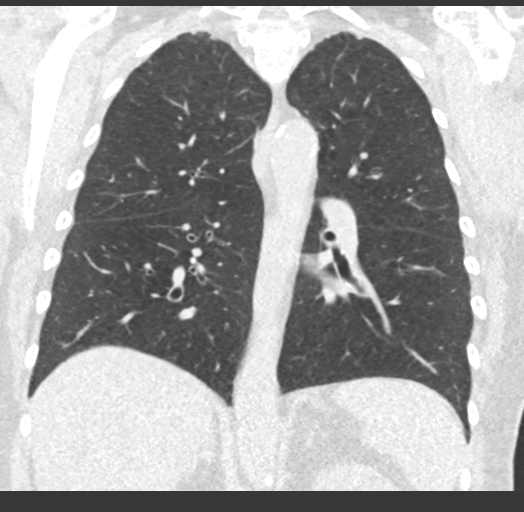

[15 of 36 positions shown; findings below may reference images not displayed]

FINDINGS: Cardiovascular: Heart is normal in size.  No pericardial effusion.

No evidence of thoracic aortic aneurysm. Atherosclerotic
calcifications of the aortic arch.

Coronary atherosclerosis the LAD and left circumflex.

Mediastinum/Nodes: No suspicious mediastinal lymphadenopathy.

Visualized thyroid is unremarkable.

Lungs/Pleura: Biapical pleural-parenchymal scarring.

Mild centrilobular emphysematous changes.

No focal consolidation.

No suspicious pulmonary nodules.

No pleural effusion or pneumothorax.

Upper Abdomen: Visualized upper abdomen is notable for a mildly
nodular hepatic contour.

Musculoskeletal: Mild degenerative changes of the visualized
thoracolumbar spine.
IMPRESSION: Lung-RADS 1, negative. Continue annual screening with low-dose chest
CT without contrast in 12 months.

Aortic Atherosclerosis (ACMEY-12J.J) and Emphysema (ACMEY-POR.6).

## 2019-05-17 ENCOUNTER — Encounter: Payer: Self-pay | Admitting: Family Medicine

## 2019-05-18 ENCOUNTER — Encounter: Payer: Self-pay | Admitting: Family Medicine

## 2019-05-21 ENCOUNTER — Telehealth: Payer: PPO | Admitting: Family Medicine

## 2019-05-22 ENCOUNTER — Encounter: Payer: Self-pay | Admitting: Family Medicine

## 2019-05-22 ENCOUNTER — Telehealth (INDEPENDENT_AMBULATORY_CARE_PROVIDER_SITE_OTHER): Payer: PPO | Admitting: Family Medicine

## 2019-05-22 DIAGNOSIS — M67431 Ganglion, right wrist: Secondary | ICD-10-CM | POA: Diagnosis not present

## 2019-05-22 DIAGNOSIS — M109 Gout, unspecified: Secondary | ICD-10-CM

## 2019-05-22 MED ORDER — COLCHICINE 0.6 MG PO TABS: ORAL_TABLET | ORAL | 0 refills | Status: DC

## 2019-05-22 NOTE — Progress Notes (Signed)
Virtual Visit via Video Note  I connected with Tammy Mcpherson on 05/22/19 at  1:00 PM EDT by a video enabled telemedicine application and verified that I am speaking with the correct person using two identifiers. Location patient: home Location provider: work Persons participating in the virtual visit: patient, provider  I discussed the limitations of evaluation and management by telemedicine and the availability of in person appointments. The patient expressed understanding and agreed to proceed.  Chief Complaint  Patient presents with  . Pain    right wrist     HPI: Tammy Mcpherson is a 63 y.o. adult who complains of Rt wrist pain and a "growth" located on the volar aspect of her wrist. Pt feels it has increased in size. Pain is present with movement and lifting. Symptoms x 6 mo. She has much smaller one on Lt wrist.   She also complains of redness, pain x 6 mo. Pain has increased and is now present if pt leans on  No injury or trauma. Pt has been using moisturizing lotion to area. She has taking tylenol 553m 5-6 tabs per day.  Pt is Rt hand dominant.    Past Medical History:  Diagnosis Date  . Anemia 04/30/2016  . Anxiety   . Blood transfusion without reported diagnosis   . Cardiomyopathy ([facility]    a. EF 40-45% by echo in 04/2016 b. Improved to 60-65% by repeat imaging in 2018  . CHF (congestive heart failure) ([facility]   . COPD (chronic obstructive pulmonary disease) ([facility]   . Coronary artery calcification seen on CT scan   . Essential hypertension   . GERD (gastroesophageal reflux disease)   . History of bronchitis   . History of GI bleed   . Hyperlipidemia   . Iron deficiency anemia 05/12/2016   Bleeding ulcers  . Pollen allergy   . PSVT (paroxysmal supraventricular tachycardia) ([facility]   . PVC's (premature ventricular contractions)     Past Surgical History:  Procedure Laterality Date  . ABDOMINAL HYSTERECTOMY     polyps  about age 63 . ABarbie BannerOSTEOTOMY Right 07/10/2013    Procedure: ABarbie BannerOSTEOTOMY RIGHT FOOT;  Surgeon: BMarcheta Grammes DPM;  Location: [facility];  Service: Orthopedics;  Laterality: Right;  . AMPUTATION Left 05/09/2017   Procedure: AMPUTATION 5TH TOE LEFT FOOT;  Surgeon: MCaprice Beaver DPM;  Location: [facility];  Service: Podiatry;  Laterality: Left;  . AMPUTATION Left 06/13/2017   Procedure: AMPUTATION 2ND TOE LEFT FOOT;  Surgeon: MCaprice Beaver DPM;  Location: [facility];  Service: Podiatry;  Laterality: Left;  . BUNIONECTOMY Right 07/10/2013   Procedure: VOGLER BUNIONECTOMY RIGHT FOOT;  Surgeon: BMarcheta Grammes DPM;  Location: [facility];  Service: Orthopedics;  Laterality: Right;  . CHOLECYSTECTOMY N/A 08/22/2017   Procedure: LAPAROSCOPIC CHOLECYSTECTOMY;  Surgeon: BVirl Cagey MD;  Location: [facility];  Service: General;  Laterality: N/A;  . COLONOSCOPY N/A 07/07/2016   Dr. FOneida Alar redundant left colon, diverticulosis at hepatic flexure, non-bleeding internal hemorrhoids  . ESOPHAGOGASTRODUODENOSCOPY N/A 07/07/2016   Dr. FOneida Alar many non-bleeding cratered gastric ulcers without stigmata of bleeding in gastric antrum. four 2-3 mm angioectasias without bleeding in duodenal bulb and second portion of duodenum s/p APC. Chroni gastritis on path.   . ESOPHAGOGASTRODUODENOSCOPY N/A 08/03/2017   Dr. FOneida Alar erosive gastritis, AVMs. Found a single non-bleeding angioectasia in stomach, s/p APC therapy. Four non-bleeding angioectasias in duodenum s/p APC. Non-bleeding erosive gastropathy  . IR ANGIOGRAM EXTREMITY LEFT  04/24/2017  . IR  FEM POP ART ATHERECT INC PTA MOD SED  04/24/2017  . IR INFUSION THROMBOL ARTERIAL INITIAL (MS)  04/24/2017  . IR RADIOLOGIST EVAL & MGMT  12/05/2016  . IR US GUIDE VASC ACCESS RIGHT  04/24/2017  . LIVER BIOPSY N/A 08/22/2017   Procedure: LIVER BIOPSY;  Surgeon: Virl Cagey, MD;  Location: [facility];  Service: General;  Laterality: N/A;  . METATARSAL HEAD EXCISION Right 07/10/2013   Procedure: METATARSAL HEAD  RESECTION OF DIGITS 2 AND 3 RIGHT FOOT;  Surgeon: Marcheta Grammes, DPM;  Location: [facility];  Service: Orthopedics;  Laterality: Right;  . PROXIMAL INTERPHALANGEAL FUSION (PIP) Right 07/10/2013   Procedure: ARTHRODESIS PIPJ  2ND DIGIT RIGHT FOOT;  Surgeon: Marcheta Grammes, DPM;  Location: [facility];  Service: Orthopedics;  Laterality: Right;    Family History  Adopted: Yes  Problem Relation Age of Onset  . Hypertension Father   . Coronary artery disease Sister   . Ulcers Maternal Grandmother   . Colon cancer Neg Hx        unknown, was adopted    Social History   Tobacco Use  . Smoking status: Current Every Day Smoker    Packs/day: 1.00    Years: 44.00    Pack years: 44.00    Types: Cigarettes    Start date: 05/10/1975  . Smokeless tobacco: Never Used  . Tobacco comment: pack per day 1.15.20  Substance Use Topics  . Alcohol use: Yes    Alcohol/week: 2.0 standard drinks    Types: 2 Glasses of wine per week  . Drug use: No     Current Outpatient Medications:  .  acetaminophen (TYLENOL) 500 MG tablet, Take 500 mg by mouth at bedtime as needed for moderate pain., Disp: , Rfl:  .  albuterol (PROAIR HFA) 108 (90 Base) MCG/ACT inhaler, INHALE 2 PUFFS INTO THE LUNGS EVERY 6 HOURS AS NEEDED FOR WHEEZING OR SHORTNESS OF BREATH., Disp: 18 g, Rfl: 3 .  albuterol (PROVENTIL) (2.5 MG/3ML) 0.083% nebulizer solution, Take 3 mLs (2.5 mg total) by nebulization every 4 (four) hours as needed for wheezing or shortness of breath., Disp: 75 mL, Rfl: 5 .  aspirin 81 MG chewable tablet, Chew 81 mg by mouth every morning. , Disp: , Rfl:  .  atorvastatin (LIPITOR) 20 MG tablet, TAKE ONE TABLET (20MG TOTAL) BY MOUTH DAILY (Patient taking differently: Take 20 mg by mouth every morning. ), Disp: 90 tablet, Rfl: 3 .  bisoprolol (ZEBETA) 10 MG tablet, Take 1 tablet (10 mg total) by mouth daily., Disp: 90 tablet, Rfl: 3 .  Blood Glucose Monitoring Suppl (ONE TOUCH ULTRA 2) w/Device KIT, , Disp: , Rfl:   .  Continuous Blood Gluc Receiver (FREESTYLE LIBRE 34 DAY READER) DEVI, See admin instructions., Disp: , Rfl:  .  Continuous Blood Gluc Sensor (FREESTYLE LIBRE SENSOR SYSTEM) MISC, Use as directed, Disp: 1 each, Rfl: 0 .  cyanocobalamin (,VITAMIN B-12,) 1000 MCG/ML injection, Inject 1 mL (1,000 mcg total) into the muscle every 30 (thirty) days., Disp: 1 mL, Rfl: 11 .  Fluticasone-Umeclidin-Vilant (TRELEGY ELLIPTA) 100-62.5-25 MCG/INH AEPB, Inhale 1 puff into the lungs daily., Disp: 60 each, Rfl: 6 .  gabapentin (NEURONTIN) 300 MG capsule, Take 1 capsule (300 mg total) by mouth at bedtime., Disp: 90 capsule, Rfl: 3 .  glucose blood (ONETOUCH ULTRA) test strip, Use as instructed, Disp: 100 each, Rfl: 12 .  glucose monitoring kit (FREESTYLE) monitoring kit, Use to check blood sugar BID as directed, Disp: 1  each, Rfl: 0 .  Lancets (ONETOUCH DELICA PLUS [id number]) MISC, 1 each by Does not apply route 2 (two) times a day., Disp: 100 each, Rfl: 3 .  losartan (COZAAR) 100 MG tablet, TAKE ONE (1) TABLET BY MOUTH EVERY DAY (Patient taking differently: Take 100 mg by mouth every morning. TAKE ONE (1) TABLET BY MOUTH EVERY DAY), Disp: 90 tablet, Rfl: 1 .  metFORMIN (GLUCOPHAGE XR) 500 MG 24 hr tablet, Take 2 tablets (1,000 mg total) by mouth daily with breakfast., Disp: 90 tablet, Rfl: 3 .  Misc. Devices MISC, Please provide supplies (needle, syringe, alcohol swabs) needed for patient to self-administer B-12 injections monthly., Disp: 1 each, Rfl: 12 .  nicotine polacrilex (COMMIT) 4 MG lozenge, Take 1 lozenge (4 mg total) by mouth as needed for smoking cessation., Disp: 100 tablet, Rfl: 3 .  pantoprazole (PROTONIX) 40 MG tablet, TAKE ONE (1) TABLET BY MOUTH EVERY DAY (Patient taking differently: Take 40 mg by mouth every morning. TAKE ONE (1) TABLET BY MOUTH EVERY DAY), Disp: 90 tablet, Rfl: 3 .  varenicline (CHANTIX PAK) 0.5 MG X 11 & 1 MG X 42 tablet, Take one 0.5 mg tablet by mouth once daily for 3 days, then  increase to one 0.5 mg tablet twice daily for 4 days, then increase to one 1 mg tablet twice daily., Disp: 53 tablet, Rfl: 0  Allergies  Allergen Reactions  . Bee Venom Swelling  . Codeine Anaphylaxis    Tongue swelled  . Penicillins Swelling and Rash    Has patient had a PCN reaction causing immediate rash, facial/tongue/throat swelling, SOB or lightheadedness with hypotension: No, delayed Has patient had a PCN reaction causing severe rash involving mucus membranes or skin necrosis: No Has patient had a PCN reaction that required hospitalization No Has patient had a PCN reaction occurring within the last 10 years: No If all of the above answers are "NO", then may proceed with Cephalosporin use.   . Bupropion Other (See Comments)    "Made my skin crawl"  . Sudafed [Pseudoephedrine Hcl] Rash      ROS: See pertinent positives and negatives per HPI.   EXAM:  VITALS per patient if applicable: There were no vitals taken for this visit.   GENERAL: alert, oriented, appears well and in no acute distress  NECK: normal movements of the head and neck  LUNGS: on inspection no signs of respiratory distress, breathing rate appears normal, no obvious gross SOB, gasping or wheezing, no conversational dyspnea  CV: no obvious cyanosis  MS: volar aspect of Rt wrist with visible subcutaneous nodule/cyst; Rt olecranon with visible erythema, mild swelling  PSYCH/NEURO: pleasant and cooperative, speech and thought processing grossly intact   ASSESSMENT AND PLAN:  1. Ganglion cyst of volar aspect of right wrist - present x 6 mo and increasing in size. Pt also with pain present with movement, lifting. - pt could benefit from aspiration, if recurrent issue will need excision - Ambulatory referral to Sports Medicine  2. Acute gout of right elbow, unspecified cause - Rt elbow pain, redness increasing x 4-6 mo, no injury/trauma - very difficult to assess virtually but doubt infection due to  length of symptoms. No trauma. Based on significant patient-reported tenderness, increasing redness, will treat as gout. Pt cannot tolerate NSAIDs and has uncontrolled DM so will avoid prednisone. Rx: - colchicine 0.6 MG tablet; 2 tab po x 1 dose then 1 tab 1hr later. Continue 1 tab po BID x 5 days  Dispense: 14  tablet; Refill: 0 - f/u PRN Discussed plan and reviewed medications with patient, including risks, benefits, and potential side effects. Pt expressed understand. All questions answered.   I discussed the assessment and treatment plan with the patient. The patient was provided an opportunity to ask questions and all were answered. The patient agreed with the plan and demonstrated an understanding of the instructions.   The patient was advised to call back or seek an in-person evaluation if the symptoms worsen or if the condition fails to improve as anticipated.   Letta Median, DO

## 2019-05-22 NOTE — Progress Notes (Signed)
Tammy Mcpherson - 63 y.o. adult MRN [id number]  Date of birth: Jul 14, 1956  SUBJECTIVE:  Including CC & ROS.  Chief Complaint  Patient presents with  . Wrist Pain    right wrist    Tammy Mcpherson is a 63 y.o. adult that is presenting with right wrist pain and a cyst.  This is ongoing for about 6 months now.  It has gotten worse as of late.  She has pain when she is writing.  She is not tried anything for it.  This is localized to the palmar surface of the radial side of the wrist.  Does not feel like it is gotten larger in size.  The symptoms are intermittent.  They are moderate in severity.  Denies any numbness or tingling.    Review of Systems  Constitutional: Negative for fever.  HENT: Negative for congestion.   Respiratory: Negative for cough.   Cardiovascular: Negative for chest pain.  Gastrointestinal: Negative for abdominal pain.  Musculoskeletal: Negative for gait problem.  Skin: Negative for color change.  Neurological: Negative for weakness.  Hematological: Negative for adenopathy.    HISTORY: Past Medical, Surgical, Social, and Family History Reviewed & Updated per EMR.   Pertinent Historical Findings include:  Past Medical History:  Diagnosis Date  . Anemia 04/30/2016  . Anxiety   . Blood transfusion without reported diagnosis   . Cardiomyopathy ([place])    a. EF 40-45% by echo in 04/2016 b. Improved to 60-65% by repeat imaging in 2018  . CHF (congestive heart failure) ([place])   . COPD (chronic obstructive pulmonary disease) ([place])   . Coronary artery calcification seen on CT scan   . Essential hypertension   . GERD (gastroesophageal reflux disease)   . History of bronchitis   . History of GI bleed   . Hyperlipidemia   . Iron deficiency anemia 05/12/2016   Bleeding ulcers  . Pollen allergy   . PSVT (paroxysmal supraventricular tachycardia) ([place])   . PVC's (premature ventricular contractions)     Past Surgical History:  Procedure Laterality Date  . ABDOMINAL  HYSTERECTOMY     polyps  about age 53  . Barbie Banner OSTEOTOMY Right 07/10/2013   Procedure: Barbie Banner OSTEOTOMY RIGHT FOOT;  Surgeon: Marcheta Grammes, DPM;  Location: [facility];  Service: Orthopedics;  Laterality: Right;  . AMPUTATION Left 05/09/2017   Procedure: AMPUTATION 5TH TOE LEFT FOOT;  Surgeon: [name], DPM;  Location: [facility];  Service: Podiatry;  Laterality: Left;  . AMPUTATION Left 06/13/2017   Procedure: AMPUTATION 2ND TOE LEFT FOOT;  Surgeon: [name], DPM;  Location: [facility];  Service: Podiatry;  Laterality: Left;  . BUNIONECTOMY Right 07/10/2013   Procedure: VOGLER BUNIONECTOMY RIGHT FOOT;  Surgeon: Marcheta Grammes, DPM;  Location: [facility];  Service: Orthopedics;  Laterality: Right;  . CHOLECYSTECTOMY N/A 08/22/2017   Procedure: LAPAROSCOPIC CHOLECYSTECTOMY;  Surgeon: Virl Cagey, MD;  Location: [facility];  Service: General;  Laterality: N/A;  . COLONOSCOPY N/A 07/07/2016   Dr. Oneida Alar; redundant left colon, diverticulosis at hepatic flexure, non-bleeding internal hemorrhoids  . ESOPHAGOGASTRODUODENOSCOPY N/A 07/07/2016   Dr. Oneida Alar: many non-bleeding cratered gastric ulcers without stigmata of bleeding in gastric antrum. four 2-3 mm angioectasias without bleeding in duodenal bulb and second portion of duodenum s/p APC. Chroni gastritis on path.   . ESOPHAGOGASTRODUODENOSCOPY N/A 08/03/2017   Dr. Oneida Alar: erosive gastritis, AVMs. Found a single non-bleeding angioectasia in stomach, s/p APC therapy. Four non-bleeding angioectasias in duodenum s/p APC. Non-bleeding erosive  gastropathy  . IR ANGIOGRAM EXTREMITY LEFT  04/24/2017  . IR FEM POP ART ATHERECT INC PTA MOD SED  04/24/2017  . IR INFUSION THROMBOL ARTERIAL INITIAL (MS)  04/24/2017  . IR RADIOLOGIST EVAL & MGMT  12/05/2016  . IR US GUIDE VASC ACCESS RIGHT  04/24/2017  . LIVER BIOPSY N/A 08/22/2017   Procedure: LIVER BIOPSY;  Surgeon: Virl Cagey, MD;  Location: [facility];  Service: General;  Laterality: N/A;  .  METATARSAL HEAD EXCISION Right 07/10/2013   Procedure: METATARSAL HEAD RESECTION OF DIGITS 2 AND 3 RIGHT FOOT;  Surgeon: Marcheta Grammes, DPM;  Location: [facility];  Service: Orthopedics;  Laterality: Right;  . PROXIMAL INTERPHALANGEAL FUSION (PIP) Right 07/10/2013   Procedure: ARTHRODESIS PIPJ  2ND DIGIT RIGHT FOOT;  Surgeon: Marcheta Grammes, DPM;  Location: [facility];  Service: Orthopedics;  Laterality: Right;    Allergies  Allergen Reactions  . Bee Venom Swelling  . Codeine Anaphylaxis    Tongue swelled  . Penicillins Swelling and Rash    Has patient had a PCN reaction causing immediate rash, facial/tongue/throat swelling, SOB or lightheadedness with hypotension: No, delayed Has patient had a PCN reaction causing severe rash involving mucus membranes or skin necrosis: No Has patient had a PCN reaction that required hospitalization No Has patient had a PCN reaction occurring within the last 10 years: No If all of the above answers are "NO", then may proceed with Cephalosporin use.   . Bupropion Other (See Comments)    "Made my skin crawl"  . Sudafed [Pseudoephedrine Hcl] Rash    Family History  Adopted: Yes  Problem Relation Age of Onset  . Hypertension Father   . Coronary artery disease Sister   . Ulcers Maternal Grandmother   . Colon cancer Neg Hx        unknown, was adopted     Social History   Socioeconomic History  . Marital status: Married    Spouse name: Tammy Mcpherson  . Number of children: 1  . Years of education: 19  . Highest education level: Not on file  Occupational History  . Occupation: disabled    Comment: heart  Social Needs  . Financial resource strain: Not on file  . Food insecurity    Worry: Not on file    Inability: Not on file  . Transportation needs    Medical: Not on file    Non-medical: Not on file  Tobacco Use  . Smoking status: Current Every Day Smoker    Packs/day: 1.00    Years: 44.00    Pack years: 44.00    Types: Cigarettes     Start date: 05/10/1975  . Smokeless tobacco: Never Used  . Tobacco comment: pack per day 1.15.20  Substance and Sexual Activity  . Alcohol use: Yes    Alcohol/week: 2.0 standard drinks    Types: 2 Glasses of wine per week  . Drug use: No  . Sexual activity: Yes    Birth control/protection: Surgical  Lifestyle  . Physical activity    Days per week: Not on file    Minutes per session: Not on file  . Stress: Not on file  Relationships  . Social Herbalist on phone: Not on file    Gets together: Not on file    Attends religious service: Not on file    Active member of club or organization: Not on file    Attends meetings of clubs or organizations: Not on file  Relationship status: Not on file  . Intimate partner violence    Fear of current or ex partner: Not on file    Emotionally abused: Not on file    Physically abused: Not on file    Forced sexual activity: Not on file  Other Topics Concern  . Not on file  Social History Narrative   Disabled   Lives with husband Tammy Mcpherson   Two dogs     PHYSICAL EXAM:  VS: BP (!) 165/82   Ht 5\' 5"  (1.651 m)   Wt 145 lb (65.8 kg)   BMI 24.13 kg/m  Physical Exam Gen: NAD, alert, cooperative with exam, well-appearing ENT: normal lips, normal nasal mucosa,  Eye: normal EOM, normal conjunctiva and lids CV:  no edema, +2 pedal pulses   Resp: no accessory muscle use, non-labored,  Skin: no rashes, no areas of induration  Neuro: normal tone, normal sensation to touch Psych:  normal insight, alert and oriented MSK:  Right wrist:  Fluctuant cyst on the palmar aspect of the distal radius Normal wrist range of motion. Normal thumb opposition and range of motion. Normal strength to resistance of the thumb. Normal grip strength. No redness or streaking. Neurovascular intact  Limited ultrasound: right wrist:  There appears to be a loculated cyst on the palmar distal radius.  There appears to be a vein coursing through but no  arterial contribution.  The does not appear to be involving any flexor tendon.  Appears to be superficial to the carpal tunnel  Summary: Ganglion cyst  Ultrasound and interpretation by Clearance Coots, MD   Aspiration/Injection Procedure Note SHONTERIA ABELN 10/14/1955  Procedure: Aspiration Indications: right wrist ganglion cyst  Procedure Details Consent: Risks of procedure as well as the alternatives and risks of each were explained to the (patient/caregiver).  Consent for procedure obtained. Time Out: Verified patient identification, verified procedure, site/side was marked, verified correct patient position, special equipment/implants available, medications/allergies/relevent history reviewed, required imaging and test results available.  Performed.  The area was cleaned with iodine and alcohol swabs.    The right wrist ganglion cyst was injected using 6 cc's of 1% lidocaine with a 25 1 1/2" needle.  An 18-gauge needle was inserted into the cyst.  Ultrasound was used. Images were obtained in long views showing the injection.    Amount of Fluid Aspirated: minimal amount Character of Fluid: Gelatinous, clear Fluid was sent for:n/a  A sterile dressing was applied.  Patient did tolerate procedure well.       ASSESSMENT & PLAN:   Ganglion cyst of volar aspect of right wrist Needle was inserted with ultrasound.  The cyst was then expressed with manipulation to achieve reduction. -Aspiration today. -Counseled on supportive care. -Can repeat if needed.

## 2019-05-22 NOTE — Patient Instructions (Signed)
Ganglion Cyst  A ganglion cyst is a non-cancerous, fluid-filled lump that occurs near a joint or tendon. The cyst grows out of a joint or the lining of a tendon. Ganglion cysts most often develop in the hand or wrist, but they can also develop in the shoulder, elbow, hip, knee, ankle, or foot. Ganglion cysts are ball-shaped or egg-shaped. Their size can range from the size of a pea to larger than a grape. Increased activity may cause the cyst to get bigger because more fluid starts to build up. What are the causes? The exact cause of this condition is not known, but it may be related to:  Inflammation or irritation around the joint.  An injury.  Repetitive movements or overuse.  Arthritis. What increases the risk? You are more likely to develop this condition if:  You are a woman.  You are 40-59 years old. What are the signs or symptoms? The main symptom of this condition is a lump. It most often appears on the hand or wrist. In many cases, there are no other symptoms, but a cyst can sometimes cause:  Tingling.  Pain.  Numbness.  Muscle weakness.  Weak grip.  Less range of motion in a joint. How is this diagnosed? Ganglion cysts are usually diagnosed based on a physical exam. Your health care provider will feel the lump and may shine a light next to it. If it is a ganglion cyst, the light will likely shine through it. Your health care provider may order an X-ray, ultrasound, or MRI to rule out other conditions. How is this treated? Ganglion cysts often go away on their own without treatment. If you have pain or other symptoms, treatment may be needed. Treatment is also needed if the ganglion cyst limits your movement or if it gets infected. Treatment may include:  Wearing a brace or splint on your wrist or finger.  Taking anti-inflammatory medicine.  Having fluid drained from the lump with a needle (aspiration).  Getting a steroid injected into the joint.  Having  surgery to remove the ganglion cyst.  Placing a pad on your shoe or wearing shoes that will not rub against the cyst if it is on your foot. Follow these instructions at home:  Do not press on the ganglion cyst, poke it with a needle, or hit it.  Take over-the-counter and prescription medicines only as told by your health care provider.  If you have a brace or splint: ? Wear it as told by your health care provider. ? Remove it as told by your health care provider. Ask if you need to remove it when you take a shower or a bath.  Watch your ganglion cyst for any changes.  Keep all follow-up visits as told by your health care provider. This is important. Contact a health care provider if:  Your ganglion cyst becomes larger or more painful.  You have pus coming from the lump.  You have weakness or numbness in the affected area.  You have a fever or chills. Get help right away if:  You have a fever and have any of these in the cyst area: ? Increased redness. ? Red streaks. ? Swelling. Summary  A ganglion cyst is a non-cancerous, fluid-filled lump that occurs near a joint or tendon.  Ganglion cysts most often develop in the hand or wrist, but they can also develop in the shoulder, elbow, hip, knee, ankle, or foot.  Ganglion cysts often go away on their own without treatment.  This information is not intended to replace advice given to you by your health care provider. Make sure you discuss any questions you have with your health care provider. Document Released: 09/15/2000 Document Revised: 08/31/2017 Document Reviewed: 05/18/2017 Elsevier Patient Education  2020 [employer]  

## 2019-05-23 ENCOUNTER — Ambulatory Visit: Payer: PPO | Admitting: Family Medicine

## 2019-05-23 ENCOUNTER — Other Ambulatory Visit: Payer: Self-pay

## 2019-05-23 ENCOUNTER — Encounter: Payer: Self-pay | Admitting: Family Medicine

## 2019-05-23 ENCOUNTER — Ambulatory Visit: Payer: Self-pay

## 2019-05-23 VITALS — BP 165/82 | Ht 65.0 in | Wt 145.0 lb

## 2019-05-23 DIAGNOSIS — M67431 Ganglion, right wrist: Secondary | ICD-10-CM

## 2019-05-23 DIAGNOSIS — J449 Chronic obstructive pulmonary disease, unspecified: Secondary | ICD-10-CM | POA: Diagnosis not present

## 2019-05-23 DIAGNOSIS — M25531 Pain in right wrist: Secondary | ICD-10-CM

## 2019-05-23 NOTE — Assessment & Plan Note (Signed)
Needle was inserted with ultrasound.  The cyst was then expressed with manipulation to achieve reduction. -Aspiration today. -Counseled on supportive care. -Can repeat if needed.

## 2019-05-23 NOTE — Patient Instructions (Signed)
Nice to meet you  Please ice the area  Please keep the area clean.  Please send [place] a message in Mychart with any questions or updates.  Please see [place] back in 4 weeks.   --Dr. Raeford Razor

## 2019-05-30 ENCOUNTER — Other Ambulatory Visit (HOSPITAL_COMMUNITY): Payer: Self-pay | Admitting: Nurse Practitioner

## 2019-05-30 DIAGNOSIS — D500 Iron deficiency anemia secondary to blood loss (chronic): Secondary | ICD-10-CM

## 2019-06-02 ENCOUNTER — Inpatient Hospital Stay (HOSPITAL_COMMUNITY): Payer: PPO | Attending: Hematology

## 2019-06-02 ENCOUNTER — Other Ambulatory Visit: Payer: Self-pay

## 2019-06-02 DIAGNOSIS — D500 Iron deficiency anemia secondary to blood loss (chronic): Secondary | ICD-10-CM | POA: Diagnosis not present

## 2019-06-02 DIAGNOSIS — K922 Gastrointestinal hemorrhage, unspecified: Secondary | ICD-10-CM | POA: Insufficient documentation

## 2019-06-02 LAB — COMPREHENSIVE METABOLIC PANEL
ALT: 43 U/L (ref 0–44)
AST: 26 U/L (ref 15–41)
Albumin: 3.8 g/dL (ref 3.5–5.0)
Alkaline Phosphatase: 119 U/L (ref 38–126)
Anion gap: 11 (ref 5–15)
BUN: 9 mg/dL (ref 8–23)
CO2: 21 mmol/L — ABNORMAL LOW (ref 22–32)
Calcium: 9.2 mg/dL (ref 8.9–10.3)
Chloride: 106 mmol/L (ref 98–111)
Creatinine, Ser: 0.74 mg/dL (ref 0.44–1.00)
GFR calc Af Amer: >60 mL/min (ref >60)
GFR calc non Af Amer: >60 mL/min (ref >60)
Glucose, Bld: 213 mg/dL — ABNORMAL HIGH (ref 70–99)
Potassium: 4.2 mmol/L (ref 3.5–5.1)
Sodium: 138 mmol/L (ref 135–145)
Total Bilirubin: 0.4 mg/dL (ref 0.3–1.2)
Total Protein: 7.1 g/dL (ref 6.5–8.1)

## 2019-06-02 LAB — IRON AND TIBC
Iron: 57 ug/dL (ref 28–170)
Saturation Ratios: 11 % (ref 10.4–31.8)
TIBC: 513 ug/dL — ABNORMAL HIGH (ref 250–450)
UIBC: 456 ug/dL

## 2019-06-02 LAB — FERRITIN: Ferritin: 15 ng/mL (ref 11–307)

## 2019-06-02 LAB — VITAMIN B12: Vitamin B-12: 518 pg/mL (ref 180–914)

## 2019-06-02 LAB — FOLATE: Folate: 11.4 ng/mL (ref >5.9)

## 2019-06-02 LAB — LACTATE DEHYDROGENASE: LDH: 137 U/L (ref 98–192)

## 2019-06-03 ENCOUNTER — Inpatient Hospital Stay (HOSPITAL_COMMUNITY): Payer: PPO

## 2019-06-03 ENCOUNTER — Inpatient Hospital Stay (HOSPITAL_COMMUNITY): Payer: PPO | Attending: Hematology | Admitting: Nurse Practitioner

## 2019-06-03 ENCOUNTER — Other Ambulatory Visit: Payer: Self-pay

## 2019-06-03 ENCOUNTER — Other Ambulatory Visit (HOSPITAL_COMMUNITY): Payer: PPO

## 2019-06-03 DIAGNOSIS — Z885 Allergy status to narcotic agent status: Secondary | ICD-10-CM | POA: Insufficient documentation

## 2019-06-03 DIAGNOSIS — Z7289 Other problems related to lifestyle: Secondary | ICD-10-CM | POA: Insufficient documentation

## 2019-06-03 DIAGNOSIS — Z880 Allergy status to penicillin: Secondary | ICD-10-CM | POA: Insufficient documentation

## 2019-06-03 DIAGNOSIS — Z7902 Long term (current) use of antithrombotics/antiplatelets: Secondary | ICD-10-CM | POA: Insufficient documentation

## 2019-06-03 DIAGNOSIS — Z7984 Long term (current) use of oral hypoglycemic drugs: Secondary | ICD-10-CM | POA: Diagnosis not present

## 2019-06-03 DIAGNOSIS — Z840 Family history of diseases of the skin and subcutaneous tissue: Secondary | ICD-10-CM | POA: Insufficient documentation

## 2019-06-03 DIAGNOSIS — E119 Type 2 diabetes mellitus without complications: Secondary | ICD-10-CM | POA: Insufficient documentation

## 2019-06-03 DIAGNOSIS — D500 Iron deficiency anemia secondary to blood loss (chronic): Secondary | ICD-10-CM | POA: Diagnosis not present

## 2019-06-03 DIAGNOSIS — Z8249 Family history of ischemic heart disease and other diseases of the circulatory system: Secondary | ICD-10-CM | POA: Insufficient documentation

## 2019-06-03 DIAGNOSIS — E538 Deficiency of other specified B group vitamins: Secondary | ICD-10-CM | POA: Insufficient documentation

## 2019-06-03 DIAGNOSIS — R5383 Other fatigue: Secondary | ICD-10-CM | POA: Diagnosis not present

## 2019-06-03 DIAGNOSIS — D72829 Elevated white blood cell count, unspecified: Secondary | ICD-10-CM | POA: Insufficient documentation

## 2019-06-03 DIAGNOSIS — F17210 Nicotine dependence, cigarettes, uncomplicated: Secondary | ICD-10-CM | POA: Diagnosis not present

## 2019-06-03 DIAGNOSIS — Z79899 Other long term (current) drug therapy: Secondary | ICD-10-CM | POA: Diagnosis not present

## 2019-06-03 DIAGNOSIS — R42 Dizziness and giddiness: Secondary | ICD-10-CM | POA: Insufficient documentation

## 2019-06-03 LAB — CBC WITH DIFFERENTIAL/PLATELET
Abs Immature Granulocytes: 0.06 10*3/uL (ref 0.00–0.07)
Basophils Absolute: 0.1 10*3/uL (ref 0.0–0.1)
Basophils Relative: 1 %
Eosinophils Absolute: 0.3 10*3/uL (ref 0.0–0.5)
Eosinophils Relative: 2 %
HCT: 40.9 % (ref 36.0–46.0)
Hemoglobin: 12.7 g/dL (ref 12.0–15.0)
Immature Granulocytes: 0 %
Lymphocytes Relative: 14 %
Lymphs Abs: 1.9 10*3/uL (ref 0.7–4.0)
MCH: 28.8 pg (ref 26.0–34.0)
MCHC: 31.1 g/dL (ref 30.0–36.0)
MCV: 92.7 fL (ref 80.0–100.0)
Monocytes Absolute: 1.6 10*3/uL — ABNORMAL HIGH (ref 0.1–1.0)
Monocytes Relative: 12 %
Neutro Abs: 9.5 10*3/uL — ABNORMAL HIGH (ref 1.7–7.7)
Neutrophils Relative %: 71 %
Platelets: 457 10*3/uL — ABNORMAL HIGH (ref 150–400)
RBC: 4.41 MIL/uL (ref 3.87–5.11)
RDW: 14.2 % (ref 11.5–15.5)
WBC: 13.4 10*3/uL — ABNORMAL HIGH (ref 4.0–10.5)
nRBC: 0 % (ref 0.0–0.2)

## 2019-06-03 LAB — VITAMIN D 25 HYDROXY (VIT D DEFICIENCY, FRACTURES): Vit D, 25-Hydroxy: 27.1 ng/mL — ABNORMAL LOW (ref 30.0–100.0)

## 2019-06-03 NOTE — Assessment & Plan Note (Signed)
1.  Iron deficiency anemia: - Likely due to from chronic GI blood loss from gastric and small bowel AVMs, patient was on aspirin and Plavix. -Plavix was discontinued in October 2019.  She does continue taking her aspirin 81 mg daily -She has a history of receiving multiple blood transfusions. -She denies any bleeding per rectum or melena.  She does complain of fatigue. - Her last Feraheme infusion was on 10/17/2018 and 10/24/2018. - She had labs on 06/03/2019 showed her hemoglobin 12.7, ferritin 15, percent saturation 11. -We will see her back in 3 months with repeat labs.  2.  Leukocytosis and thrombocytosis: - Her [place] count has been elevated since 2004, predominantly neutrophils.  She also has had a elevated platelet count. - Her blood work was evaluated and BCR/ABL by FISH and Jak 2 V6 17 F testing were negative ruling out myeloproliferative disorders. -Labs on 06/03/2019 showed her WBC at 13.4 and platelets at 457. -We will continue to monitor.  3.  B12 deficiency: - Labs on 01/21/2019 showed her B12 level at 900. -She has been getting monthly B12 injections. -We will continue to monitor her levels.  4.  Newly diagnosed diabetes: - Her blood sugars were found to be over 500. - Her PCP placed her on metformin 500 mg twice daily. - She reports the lowest she seen her blood sugar was 213. - She reports when her blood sugars are in the 200s she feels fatigued and dizzy. -She will follow-up with her PCP in a few weeks.

## 2019-06-03 NOTE — Progress Notes (Signed)
Tammy Mcpherson, [place] [address]   CLINIC:  Medical Oncology/Hematology  PCP:  Ronnald Nian, DO [place] [address] [phone]   REASON FOR VISIT: Follow-up for iron deficiency anemia, leukocytosis and thrombocytosis  CURRENT THERAPY: Intermittent iron infusions   INTERVAL HISTORY:  Tammy Mcpherson 63 y.o. adult returns for routine follow-up for iron deficiency anemia.  She reports she has been doing well since her last visit.  She denies any bright red bleeding per rectum or melena.  She denies any extreme fatigue.  She denies any easy bruising. Denies any nausea, vomiting, or diarrhea. Denies any new pains. Had not noticed any recent bleeding such as epistaxis, hematuria or hematochezia. Denies recent chest pain on exertion, shortness of breath on minimal exertion, pre-syncopal episodes, or palpitations. Denies any numbness or tingling in hands or feet. Denies any recent fevers, infections, or recent hospitalizations. Patient reports appetite at 100 % and energy level at 50 %.  She is eating well maintaining her weight at this time.     REVIEW OF SYSTEMS:  Review of Systems  Constitutional: Positive for fatigue.  All other systems reviewed and are negative.    PAST MEDICAL/SURGICAL HISTORY:  Past Medical History:  Diagnosis Date   Anemia 04/30/2016   Anxiety    Blood transfusion without reported diagnosis    Cardiomyopathy ([facility])    a. EF 40-45% by echo in 04/2016 b. Improved to 60-65% by repeat imaging in 2018   CHF (congestive heart failure) (HCC)    COPD (chronic obstructive pulmonary disease) (HCC)    Coronary artery calcification seen on CT scan    Essential hypertension    GERD (gastroesophageal reflux disease)    History of bronchitis    History of GI bleed    Hyperlipidemia    Iron deficiency anemia 05/12/2016   Bleeding ulcers   Pollen allergy    PSVT (paroxysmal supraventricular  tachycardia) (HCC)    PVC's (premature ventricular contractions)    Past Surgical History:  Procedure Laterality Date   ABDOMINAL HYSTERECTOMY     polyps  about age 3   AIKEN OSTEOTOMY Right 07/10/2013   Procedure: Barbie Banner OSTEOTOMY RIGHT FOOT;  Surgeon: Marcheta Grammes, DPM;  Location: [facility];  Service: Orthopedics;  Laterality: Right;   AMPUTATION Left 05/09/2017   Procedure: AMPUTATION 5TH TOE LEFT FOOT;  Surgeon: [name], DPM;  Location: [facility];  Service: Podiatry;  Laterality: Left;   AMPUTATION Left 06/13/2017   Procedure: AMPUTATION 2ND TOE LEFT FOOT;  Surgeon: [name], DPM;  Location: [facility];  Service: Podiatry;  Laterality: Left;   BUNIONECTOMY Right 07/10/2013   Procedure: VOGLER BUNIONECTOMY RIGHT FOOT;  Surgeon: Marcheta Grammes, DPM;  Location: [facility];  Service: Orthopedics;  Laterality: Right;   CHOLECYSTECTOMY N/A 08/22/2017   Procedure: LAPAROSCOPIC CHOLECYSTECTOMY;  Surgeon: Virl Cagey, MD;  Location: [facility];  Service: General;  Laterality: N/A;   COLONOSCOPY N/A 07/07/2016   Dr. Oneida Alar; redundant left colon, diverticulosis at hepatic flexure, non-bleeding internal hemorrhoids   ESOPHAGOGASTRODUODENOSCOPY N/A 07/07/2016   Dr. Oneida Alar: many non-bleeding cratered gastric ulcers without stigmata of bleeding in gastric antrum. four 2-3 mm angioectasias without bleeding in duodenal bulb and second portion of duodenum s/p APC. Chroni gastritis on path.    ESOPHAGOGASTRODUODENOSCOPY N/A 08/03/2017   Dr. Oneida Alar: erosive gastritis, AVMs. Found a single non-bleeding angioectasia in stomach, s/p APC therapy. Four non-bleeding angioectasias in duodenum s/p APC. Non-bleeding erosive  IR ANGIOGRAM EXTREMITY LEFT  04/24/2017  °• IR FEM POP ART ATHERECT INC PTA MOD SED  04/24/2017  °• IR INFUSION THROMBOL ARTERIAL INITIAL (MS)  04/24/2017  °• IR RADIOLOGIST EVAL & MGMT  12/05/2016  °• IR US GUIDE VASC ACCESS RIGHT  04/24/2017  °• LIVER BIOPSY  N/A 08/22/2017  ° Procedure: LIVER BIOPSY;  Surgeon: Bridges, Lindsay C, MD;  Location: [facility];  Service: General;  Laterality: N/A;  °• METATARSAL HEAD EXCISION Right 07/10/2013  ° Procedure: METATARSAL HEAD RESECTION OF DIGITS 2 AND 3 RIGHT FOOT;  Surgeon: Benjamin Ivan McKinney, DPM;  Location: [facility];  Service: Orthopedics;  Laterality: Right;  °• PROXIMAL INTERPHALANGEAL FUSION (PIP) Right 07/10/2013  ° Procedure: ARTHRODESIS PIPJ  2ND DIGIT RIGHT FOOT;  Surgeon: Benjamin Ivan McKinney, DPM;  Location: [facility];  Service: Orthopedics;  Laterality: Right;  ° ° ° °SOCIAL HISTORY:  °Social History  ° °Socioeconomic History  °• Marital status: Married  °  Spouse name: Wade  °• Number of children: 1  °• Years of education: 12  °• Highest education level: Not on file  °Occupational History  °• Occupation: disabled  °  Comment: heart  °Social Needs  °• Financial resource strain: Not on file  °• Food insecurity  °  Worry: Not on file  °  Inability: Not on file  °• Transportation needs  °  Medical: Not on file  °  Non-medical: Not on file  °Tobacco Use  °• Smoking status: Current Every Day Smoker  °  Packs/day: 1.00  °  Years: 44.00  °  Pack years: 44.00  °  Types: Cigarettes  °  Start date: 05/10/1975  °• Smokeless tobacco: Never Used  °• Tobacco comment: pack per day 1.15.20  °Substance and Sexual Activity  °• Alcohol use: Yes  °  Alcohol/week: 2.0 standard drinks  °  Types: 2 Glasses of wine per week  °• Drug use: No  °• Sexual activity: Yes  °  Birth control/protection: Surgical  °Lifestyle  °• Physical activity  °  Days per week: Not on file  °  Minutes per session: Not on file  °• Stress: Not on file  °Relationships  °• Social connections  °  Talks on phone: Not on file  °  Gets together: Not on file  °  Attends religious service: Not on file  °  Active member of club or organization: Not on file  °  Attends meetings of clubs or organizations: Not on file  °  Relationship status: Not on file  °• Intimate partner  violence  °  Fear of current or ex partner: Not on file  °  Emotionally abused: Not on file  °  Physically abused: Not on file  °  Forced sexual activity: Not on file  °Other Topics Concern  °• Not on file  °Social History Narrative  ° Disabled  ° Lives with husband Wade  ° Two dogs  ° ° °FAMILY HISTORY:  °Family History  °Adopted: Yes  °Problem Relation Age of Onset  °• Hypertension Father   °• Coronary artery disease Sister   °• Ulcers Maternal Grandmother   °• Colon cancer Neg Hx   °     unknown, was adopted  ° ° °CURRENT MEDICATIONS:  °Outpatient Encounter Medications as of 06/03/2019  °Medication Sig Note  °• acetaminophen (TYLENOL) 500 MG tablet Take 500 mg by mouth at bedtime as needed for moderate pain.   °• albuterol (PROAIR HFA) 108 (  90 Base) MCG/ACT inhaler INHALE 2 PUFFS INTO THE LUNGS EVERY 6 HOURS AS NEEDED FOR WHEEZING OR SHORTNESS OF BREATH.   °• albuterol (PROVENTIL) (2.5 MG/3ML) 0.083% nebulizer solution Take 3 mLs (2.5 mg total) by nebulization every 4 (four) hours as needed for wheezing or shortness of breath.   °• aspirin 81 MG chewable tablet Chew 81 mg by mouth every morning.    °• atorvastatin (LIPITOR) 20 MG tablet TAKE ONE TABLET (20MG TOTAL) BY MOUTH DAILY (Patient taking differently: Take 20 mg by mouth every morning. )   °• bisoprolol (ZEBETA) 10 MG tablet Take 1 tablet (10 mg total) by mouth daily.   °• Blood Glucose Monitoring Suppl (ONE TOUCH ULTRA 2) w/Device KIT    °• colchicine 0.6 MG tablet 2 tab po x 1 dose then 1 tab 1hr later. Continue 1 tab po BID x 5 days   °• Continuous Blood Gluc Receiver (FREESTYLE LIBRE 14 DAY READER) DEVI See admin instructions.   °• Continuous Blood Gluc Sensor (FREESTYLE LIBRE SENSOR SYSTEM) MISC Use as directed   °• cyanocobalamin (,VITAMIN B-12,) 1000 MCG/ML injection Inject 1 mL (1,000 mcg total) into the muscle every 30 (thirty) days. 01/21/2019: 01/09/2019  °• Fluticasone-Umeclidin-Vilant (TRELEGY ELLIPTA) 100-62.5-25 MCG/INH AEPB Inhale 1 puff into  the lungs daily.   °• gabapentin (NEURONTIN) 300 MG capsule Take 1 capsule (300 mg total) by mouth at bedtime.   °• glucose blood (ONETOUCH ULTRA) test strip Use as instructed   °• glucose monitoring kit (FREESTYLE) monitoring kit Use to check blood sugar BID as directed   °• Lancets (ONETOUCH DELICA PLUS LANCET33G) MISC 1 each by Does not apply route 2 (two) times a day.   °• losartan (COZAAR) 100 MG tablet TAKE ONE (1) TABLET BY MOUTH EVERY DAY (Patient taking differently: Take 100 mg by mouth every morning. TAKE ONE (1) TABLET BY MOUTH EVERY DAY)   °• metFORMIN (GLUCOPHAGE XR) 500 MG 24 hr tablet Take 2 tablets (1,000 mg total) by mouth daily with breakfast.   °• Misc. Devices MISC Please provide supplies (needle, syringe, alcohol swabs) needed for patient to self-administer B-12 injections monthly.   °• nicotine polacrilex (COMMIT) 4 MG lozenge Take 1 lozenge (4 mg total) by mouth as needed for smoking cessation.   °• pantoprazole (PROTONIX) 40 MG tablet TAKE ONE (1) TABLET BY MOUTH EVERY DAY (Patient taking differently: Take 40 mg by mouth every morning. TAKE ONE (1) TABLET BY MOUTH EVERY DAY)   °• varenicline (CHANTIX PAK) 0.5 MG X 11 & 1 MG X 42 tablet Take one 0.5 mg tablet by mouth once daily for 3 days, then increase to one 0.5 mg tablet twice daily for 4 days, then increase to one 1 mg tablet twice daily.   ° °No facility-administered encounter medications on file as of 06/03/2019.   ° ° °ALLERGIES:  °Allergies  °Allergen Reactions  °• Bee Venom Swelling  °• Codeine Anaphylaxis  °  Tongue swelled  °• Penicillins Swelling and Rash  °  Has patient had a PCN reaction causing immediate rash, facial/tongue/throat swelling, SOB or lightheadedness with hypotension: No, delayed °Has patient had a PCN reaction causing severe rash involving mucus membranes or skin necrosis: No °Has patient had a PCN reaction that required hospitalization No °Has patient had a PCN reaction occurring within the last 10 years: No °If  all of the above answers are "NO", then may proceed with Cephalosporin use. °  °• Bupropion Other (See Comments)  °  "Made   Made my skin crawl"   Sudafed [Pseudoephedrine Hcl] Rash     PHYSICAL EXAM:  ECOG Performance status: 1  Vital signs deferred due to telephone visit   Physical Exam Neurological:     Mental Status: She is alert.  Psychiatric:        Mood and Affect: Mood normal.        Behavior: Behavior normal.        Thought Content: Thought content normal.        Judgment: Judgment normal.   Physical assessment minimal due to telephone visit   LABORATORY DATA:  I have reviewed the labs as listed.  CBC    Component Value Date/Time   WBC 13.4 (H) 06/03/2019 1128   RBC 4.41 06/03/2019 1128   HGB 12.7 06/03/2019 1128   HCT 40.9 06/03/2019 1128   PLT 457 (H) 06/03/2019 1128   MCV 92.7 06/03/2019 1128   MCH 28.8 06/03/2019 1128   MCHC 31.1 06/03/2019 1128   RDW 14.2 06/03/2019 1128   LYMPHSABS 1.9 06/03/2019 1128   MONOABS 1.6 (H) 06/03/2019 1128   EOSABS 0.3 06/03/2019 1128   BASOSABS 0.1 06/03/2019 1128   CMP Latest Ref Rng & Units 06/02/2019 03/25/2019 02/20/2019  Glucose 70 - 99 mg/dL 213(H) 200(H) 259(H)  BUN 8 - 23 mg/dL _0 Creatinine 0.44 - 1.00 mg/dL 0.74 0.70 0.69  Sodium 135 - 145 mmol/L 138 139 138  Potassium 3.5 - 5.1 mmol/L 4.2 4.0 4.0  Chloride 98 - 111 mmol/L 106 105 103  CO2 22 - 32 mmol/L 21(L) 21(L) 22  Calcium 8.9 - 10.3 mg/dL 9.2 9.3 9.4  Total Protein 6.5 - 8.1 g/dL 7.1 7.2 6.9  Total Bilirubin 0.3 - 1.2 mg/dL 0.4 0.4 0.6  Alkaline Phos 38 - 126 U/L 119 104 107  AST 15 - 41 U/L _1 ALT 0 - 44 U/L 43 21 25     I personally performed a face-to-face visit.  All questions were answered to patient's stated satisfaction. Encouraged patient to call with any new concerns or questions before his next visit to the cancer center and we can certain see him sooner, if needed.     ASSESSMENT & PLAN:   Anemia, iron deficiency 1.  Iron  deficiency anemia: - Likely due to from chronic GI blood loss from gastric and small bowel AVMs, patient was on aspirin and Plavix. -Plavix was discontinued in October 2019.  She does continue taking her aspirin 81 mg daily -She has a history of receiving multiple blood transfusions. -She denies any bleeding per rectum or melena.  She does complain of fatigue. - Her last Feraheme infusion was on 10/17/2018 and 10/24/2018. - She had labs on 06/03/2019 showed her hemoglobin 12.7, ferritin 15, percent saturation 11. -We will see her back in 3 months with repeat labs.  2.  Leukocytosis and thrombocytosis: - Her Meline count has been elevated since 2004, predominantly neutrophils.  She also has had a elevated platelet count. - Her blood work was evaluated and BCR/ABL by FISH and Jak 2 V6 17 F testing were negative ruling out myeloproliferative disorders. -Labs on 06/03/2019 showed her WBC at 13.4 and platelets at 457. -We will continue to monitor.  3.  B12 deficiency: - Labs on 01/21/2019 showed her B12 level at 900. -She has been getting monthly B12 injections. -We will continue to monitor her levels.  4.  Newly diagnosed diabetes: - Her blood sugars were found to be over  Her PCP placed her on metformin 500 mg twice daily. °- She reports the lowest she seen her blood sugar was 213. °- She reports when her blood sugars are in the 200s she feels fatigued and dizzy. °-She will follow-up with her PCP in a few weeks. ° ° °  ° °Orders placed this encounter:  °Orders Placed This Encounter  °Procedures  °• CBC with Differential  °• Lactate dehydrogenase  °• CBC with Differential/Platelet  °• Comprehensive metabolic panel  °• Ferritin  °• Iron and TIBC  °• Vitamin B12  °• VITAMIN D 25 Hydroxy (Vit-D Deficiency, Fractures)  ° ° ° ° °Randi Lockamy, FNP-C °[facility] °[phone] ° °

## 2019-06-04 ENCOUNTER — Encounter: Payer: Self-pay | Admitting: Family Medicine

## 2019-06-10 ENCOUNTER — Telehealth: Payer: Self-pay | Admitting: Family Medicine

## 2019-06-10 DIAGNOSIS — M67431 Ganglion, right wrist: Secondary | ICD-10-CM

## 2019-06-10 NOTE — Telephone Encounter (Signed)
Patient requesting a referral to a surgeon for the ganglion cyst on her right wrist. She states the cyst came back two days after the aspiration

## 2019-06-10 NOTE — Telephone Encounter (Signed)
Spoke with patient about the cyst and will refer to ortho hand.   Rosemarie Ax, MD [facility] 06/10/2019, 5:33 PM

## 2019-06-17 DIAGNOSIS — R200 Anesthesia of skin: Secondary | ICD-10-CM | POA: Diagnosis not present

## 2019-06-17 DIAGNOSIS — M1811 Unilateral primary osteoarthritis of first carpometacarpal joint, right hand: Secondary | ICD-10-CM | POA: Diagnosis not present

## 2019-06-20 ENCOUNTER — Ambulatory Visit: Payer: PPO | Admitting: Family Medicine

## 2019-06-23 DIAGNOSIS — J449 Chronic obstructive pulmonary disease, unspecified: Secondary | ICD-10-CM | POA: Diagnosis not present

## 2019-06-30 DIAGNOSIS — R200 Anesthesia of skin: Secondary | ICD-10-CM | POA: Diagnosis not present

## 2019-07-02 ENCOUNTER — Encounter: Payer: Self-pay | Admitting: Registered Nurse

## 2019-07-02 ENCOUNTER — Other Ambulatory Visit: Payer: Self-pay

## 2019-07-02 ENCOUNTER — Ambulatory Visit (INDEPENDENT_AMBULATORY_CARE_PROVIDER_SITE_OTHER): Payer: PPO | Admitting: Registered Nurse

## 2019-07-02 VITALS — BP 152/70 | HR 96 | Temp 98.0°F | Resp 16 | Ht 62.8 in | Wt 157.0 lb

## 2019-07-02 DIAGNOSIS — E1165 Type 2 diabetes mellitus with hyperglycemia: Secondary | ICD-10-CM

## 2019-07-02 DIAGNOSIS — Z23 Encounter for immunization: Secondary | ICD-10-CM | POA: Diagnosis not present

## 2019-07-02 DIAGNOSIS — K279 Peptic ulcer, site unspecified, unspecified as acute or chronic, without hemorrhage or perforation: Secondary | ICD-10-CM

## 2019-07-02 DIAGNOSIS — Z13220 Encounter for screening for lipoid disorders: Secondary | ICD-10-CM

## 2019-07-02 NOTE — Progress Notes (Signed)
Established Patient Office Visit  Subjective:  [name], adult    DOB: 09/13/1956  Age: 63 y.o. MRN: [id number]  CC:  Chief Complaint  Patient presents with  . Establish Care    need new pcp to manage care and medications for Chronic Conditions     HPI [name] presents for visit to establish care. Had formerly established within [facility], as such, history is up to date.   She has a number of ailments that are being managed through specialists. These include: CHF, COPD, hx of PUD, Anemia. She regularly attends follow up visits with cardiology and pulmonology. Next follow up for anemia is in December. History of PUD managed with dietary changes and pantoprazole   Feels good today - no urgent concerns. No medications that need to be refilled. We will do some basic lab work to get her established and monitor her lipids and T2DM. She also requests flu shot today.   Past Medical History:  Diagnosis Date  . Anemia 04/30/2016  . Anxiety   . Blood transfusion without reported diagnosis   . Cardiomyopathy ([place])    a. EF 40-45% by echo in 04/2016 b. Improved to 60-65% by repeat imaging in 2018  . CHF (congestive heart failure) ([place])   . COPD (chronic obstructive pulmonary disease) ([place])   . Coronary artery calcification seen on CT scan   . Essential hypertension   . GERD (gastroesophageal reflux disease)   . History of bronchitis   . History of GI bleed   . Hyperlipidemia   . Iron deficiency anemia 05/12/2016   Bleeding ulcers  . Pollen allergy   . PSVT (paroxysmal supraventricular tachycardia) ([place])   . PVC's (premature ventricular contractions)     Past Surgical History:  Procedure Laterality Date  . ABDOMINAL HYSTERECTOMY     polyps  about age 9  . Barbie Banner OSTEOTOMY Right 07/10/2013   Procedure: Barbie Banner OSTEOTOMY RIGHT FOOT;  Surgeon: Marcheta Grammes, DPM;  Location: [facility];  Service: Orthopedics;  Laterality: Right;  . AMPUTATION Left 05/09/2017   Procedure: AMPUTATION 5TH TOE LEFT FOOT;  Surgeon: [name], DPM;  Location: [facility];  Service: Podiatry;  Laterality: Left;  . AMPUTATION Left 06/13/2017   Procedure: AMPUTATION 2ND TOE LEFT FOOT;  Surgeon: [name], DPM;  Location: [facility];  Service: Podiatry;  Laterality: Left;  . BUNIONECTOMY Right 07/10/2013   Procedure: VOGLER BUNIONECTOMY RIGHT FOOT;  Surgeon: Marcheta Grammes, DPM;  Location: [facility];  Service: Orthopedics;  Laterality: Right;  . CHOLECYSTECTOMY N/A 08/22/2017   Procedure: LAPAROSCOPIC CHOLECYSTECTOMY;  Surgeon: Virl Cagey, MD;  Location: [facility];  Service: General;  Laterality: N/A;  . COLONOSCOPY N/A 07/07/2016   Dr. Oneida Alar; redundant left colon, diverticulosis at hepatic flexure, non-bleeding internal hemorrhoids  . ESOPHAGOGASTRODUODENOSCOPY N/A 07/07/2016   Dr. Oneida Alar: many non-bleeding cratered gastric ulcers without stigmata of bleeding in gastric antrum. four 2-3 mm angioectasias without bleeding in duodenal bulb and second portion of duodenum s/p APC. Chroni gastritis on path.   . ESOPHAGOGASTRODUODENOSCOPY N/A 08/03/2017   Dr. Oneida Alar: erosive gastritis, AVMs. Found a single non-bleeding angioectasia in stomach, s/p APC therapy. Four non-bleeding angioectasias in duodenum s/p APC. Non-bleeding erosive gastropathy  . IR ANGIOGRAM EXTREMITY LEFT  04/24/2017  . IR FEM POP ART ATHERECT INC PTA MOD SED  04/24/2017  . IR INFUSION THROMBOL ARTERIAL INITIAL (MS)  04/24/2017  . IR RADIOLOGIST EVAL & MGMT  12/05/2016  . IR US GUIDE VASC  ACCESS RIGHT  04/24/2017  . LIVER BIOPSY N/A 08/22/2017   Procedure: LIVER BIOPSY;  Surgeon: Virl Cagey, MD;  Location: [facility];  Service: General;  Laterality: N/A;  . METATARSAL HEAD EXCISION Right 07/10/2013   Procedure: METATARSAL HEAD RESECTION OF DIGITS 2 AND 3 RIGHT FOOT;  Surgeon: Marcheta Grammes, DPM;  Location: [facility];  Service: Orthopedics;  Laterality: Right;  . PROXIMAL INTERPHALANGEAL FUSION  (PIP) Right 07/10/2013   Procedure: ARTHRODESIS PIPJ  2ND DIGIT RIGHT FOOT;  Surgeon: Marcheta Grammes, DPM;  Location: [facility];  Service: Orthopedics;  Laterality: Right;    Family History  Adopted: Yes  Problem Relation Age of Onset  . Hypertension Father   . Coronary artery disease Sister   . Ulcers Maternal Grandmother   . Colon cancer Neg Hx        unknown, was adopted    Social History   Socioeconomic History  . Marital status: Married    Spouse name: [name]  . Number of children: 1  . Years of education: 18  . Highest education level: Not on file  Occupational History  . Occupation: disabled    Comment: heart  Social Needs  . Financial resource strain: Not on file  . Food insecurity    Worry: Not on file    Inability: Not on file  . Transportation needs    Medical: Not on file    Non-medical: Not on file  Tobacco Use  . Smoking status: Current Every Day Smoker    Packs/day: 1.00    Years: 44.00    Pack years: 44.00    Types: Cigarettes    Start date: 05/10/1975  . Smokeless tobacco: Never Used  . Tobacco comment: pack per day 1.15.20  Substance and Sexual Activity  . Alcohol use: Yes    Alcohol/week: 2.0 standard drinks    Types: 2 Glasses of wine per week  . Drug use: No  . Sexual activity: Yes    Birth control/protection: Surgical  Lifestyle  . Physical activity    Days per week: Not on file    Minutes per session: Not on file  . Stress: Not on file  Relationships  . Social Herbalist on phone: Not on file    Gets together: Not on file    Attends religious service: Not on file    Active member of club or organization: Not on file    Attends meetings of clubs or organizations: Not on file    Relationship status: Not on file  . Intimate partner violence    Fear of current or ex partner: Not on file    Emotionally abused: Not on file    Physically abused: Not on file    Forced sexual activity: Not on file  Other Topics Concern  . Not  on file  Social History Narrative   Disabled   Lives with husband [name]   Two dogs    Outpatient Medications Prior to Visit  Medication Sig Dispense Refill  . acetaminophen (TYLENOL) 500 MG tablet Take 500 mg by mouth at bedtime as needed for moderate pain.    Marland Kitchen albuterol (PROAIR HFA) 108 (90 Base) MCG/ACT inhaler INHALE 2 PUFFS INTO THE LUNGS EVERY 6 HOURS AS NEEDED FOR WHEEZING OR SHORTNESS OF BREATH. 18 g 3  . albuterol (PROVENTIL) (2.5 MG/3ML) 0.083% nebulizer solution Take 3 mLs (2.5 mg total) by nebulization every 4 (four) hours as needed for wheezing or shortness of breath.  75 mL 5  . aspirin 81 MG chewable tablet Chew 81 mg by mouth every morning.     Marland Kitchen atorvastatin (LIPITOR) 20 MG tablet TAKE ONE TABLET (20MG TOTAL) BY MOUTH DAILY (Patient taking differently: Take 20 mg by mouth every morning. ) 90 tablet 3  . bisoprolol (ZEBETA) 10 MG tablet Take 1 tablet (10 mg total) by mouth daily. 90 tablet 3  . Blood Glucose Monitoring Suppl (ONE TOUCH ULTRA 2) w/Device KIT     . colchicine 0.6 MG tablet 2 tab po x 1 dose then 1 tab 1hr later. Continue 1 tab po BID x 5 days 14 tablet 0  . Continuous Blood Gluc Receiver (FREESTYLE LIBRE 14 DAY READER) DEVI See admin instructions.    . Continuous Blood Gluc Sensor (FREESTYLE LIBRE SENSOR SYSTEM) MISC Use as directed 1 each 0  . cyanocobalamin (,VITAMIN B-12,) 1000 MCG/ML injection Inject 1 mL (1,000 mcg total) into the muscle every 30 (thirty) days. 1 mL 11  . Fluticasone-Umeclidin-Vilant (TRELEGY ELLIPTA) 100-62.5-25 MCG/INH AEPB Inhale 1 puff into the lungs daily. 60 each 6  . gabapentin (NEURONTIN) 300 MG capsule Take 1 capsule (300 mg total) by mouth at bedtime. 90 capsule 3  . glucose blood (ONETOUCH ULTRA) test strip Use as instructed 100 each 12  . glucose monitoring kit (FREESTYLE) monitoring kit Use to check blood sugar BID as directed 1 each 0  . Lancets (ONETOUCH DELICA PLUS [id number]) MISC 1 each by Does not apply route 2 (two) times a  day. 100 each 3  . losartan (COZAAR) 100 MG tablet TAKE ONE (1) TABLET BY MOUTH EVERY DAY (Patient taking differently: Take 100 mg by mouth every morning. TAKE ONE (1) TABLET BY MOUTH EVERY DAY) 90 tablet 1  . metFORMIN (GLUCOPHAGE XR) 500 MG 24 hr tablet Take 2 tablets (1,000 mg total) by mouth daily with breakfast. 90 tablet 3  . Misc. Devices MISC Please provide supplies (needle, syringe, alcohol swabs) needed for patient to self-administer B-12 injections monthly. 1 each 12  . nicotine polacrilex (COMMIT) 4 MG lozenge Take 1 lozenge (4 mg total) by mouth as needed for smoking cessation. 100 tablet 3  . pantoprazole (PROTONIX) 40 MG tablet TAKE ONE (1) TABLET BY MOUTH EVERY DAY (Patient taking differently: Take 40 mg by mouth every morning. TAKE ONE (1) TABLET BY MOUTH EVERY DAY) 90 tablet 3  . varenicline (CHANTIX PAK) 0.5 MG X 11 & 1 MG X 42 tablet Take one 0.5 mg tablet by mouth once daily for 3 days, then increase to one 0.5 mg tablet twice daily for 4 days, then increase to one 1 mg tablet twice daily. 53 tablet 0   No facility-administered medications prior to visit.     Allergies  Allergen Reactions  . Bee Venom Swelling  . Codeine Anaphylaxis    Tongue swelled  . Penicillins Swelling and Rash    Has patient had a PCN reaction causing immediate rash, facial/tongue/throat swelling, SOB or lightheadedness with hypotension: No, delayed Has patient had a PCN reaction causing severe rash involving mucus membranes or skin necrosis: No Has patient had a PCN reaction that required hospitalization No Has patient had a PCN reaction occurring within the last 10 years: No If all of the above answers are "NO", then may proceed with Cephalosporin use.   . Bupropion Other (See Comments)    "Made my skin crawl"  . Sudafed [Pseudoephedrine Hcl] Rash    ROS Review of Systems  Constitutional: Negative.  HENT: Negative.   Eyes: Negative.   Respiratory: Negative.   Cardiovascular: Negative.    Gastrointestinal: Negative.   Endocrine: Negative.   Genitourinary: Negative.   Musculoskeletal: Negative.   Skin: Negative.   Allergic/Immunologic: Negative.   Neurological: Negative.   Hematological: Negative.   Psychiatric/Behavioral: Negative.   All other systems reviewed and are negative.     Objective:    Physical Exam  Constitutional: She is oriented to person, place, and time. She appears well-developed and well-nourished. No distress.  Cardiovascular: Normal rate and regular rhythm.  Pulmonary/Chest: Effort normal. No respiratory distress.  Neurological: She is alert and oriented to person, place, and time.  Skin: Skin is warm and dry. No rash noted. She is not diaphoretic. No erythema. No pallor.  Psychiatric: She has a normal mood and affect. Her behavior is normal. Judgment and thought content normal.  Nursing note and vitals reviewed.   BP (!) 152/70   Pulse 96   Temp 98 F (36.7 C) (Oral)   Resp 16   Ht 5' 2.8" (1.595 m)   Wt 157 lb (71.2 kg)   SpO2 95%   BMI 27.99 kg/m  Wt Readings from Last 3 Encounters:  07/02/19 157 lb (71.2 kg)  05/23/19 145 lb (65.8 kg)  04/22/19 156 lb 9.6 oz (71 kg)     Health Maintenance Due  Topic Date Due  . FOOT EXAM  09/01/1967  . OPHTHALMOLOGY EXAM  09/01/1967  . HIV Screening  08/31/1972  . TETANUS/TDAP  08/31/1976  . PAP SMEAR-Modifier  08/31/1978  . MAMMOGRAM  09/01/2007    There are no preventive care reminders to display for this patient.  Lab Results  Component Value Date   TSH 1.727 04/30/2016   Lab Results  Component Value Date   WBC 13.4 (H) 06/03/2019   HGB 12.7 06/03/2019   HCT 40.9 06/03/2019   MCV 92.7 06/03/2019   PLT 457 (H) 06/03/2019   Lab Results  Component Value Date   NA 138 06/02/2019   K 4.2 06/02/2019   CO2 21 (L) 06/02/2019   GLUCOSE 213 (H) 06/02/2019   BUN 9 06/02/2019   CREATININE 0.74 06/02/2019   BILITOT 0.4 06/02/2019   ALKPHOS 119 06/02/2019   AST 26 06/02/2019    ALT 43 06/02/2019   PROT 7.1 06/02/2019   ALBUMIN 3.8 06/02/2019   CALCIUM 9.2 06/02/2019   ANIONGAP 11 06/02/2019   Lab Results  Component Value Date   CHOL 148 09/28/2017   Lab Results  Component Value Date   HDL 34 (L) 09/28/2017   Lab Results  Component Value Date   LDLCALC 88 09/28/2017   Lab Results  Component Value Date   TRIG 164 (H) 09/28/2017   Lab Results  Component Value Date   CHOLHDL 4.4 09/28/2017   Lab Results  Component Value Date   HGBA1C 9.6 (H) 03/28/2019      Assessment & Plan:   Problem List Items Addressed This Visit    None    Visit Diagnoses    Flu vaccine need    -  Primary   Relevant Orders   Flu Vaccine QUAD 36+ mos IM (Completed)   Lipid screening       Relevant Orders   Lipid panel   Uncontrolled type 2 diabetes mellitus with hyperglycemia ([place])       Relevant Orders   CBC with Differential/Platelet   Comprehensive metabolic panel   Hemoglobin A1c   Peptic ulcer disease  Relevant Orders   CBC with Differential/Platelet      No orders of the defined types were placed in this encounter.   Follow-up: Return in about 6 months (around 12/30/2019).   PLAN  Reviewed history and helped coordinate care today. Patient is very on top of her care and manages appointments well.   Labs drawn - will follow up as warranted. If we see any undesirable changes in her A1c or lipids, we may plan for a sooner follow up - if no change or positive impact, plan on 6 mo follow up for med check and A1c  Suggest CPE at next visit - due for pap, mammo, diabetic foot and eye exams  Patient encouraged to call clinic with any questions, comments, or concerns.   [name], NP

## 2019-07-02 NOTE — Patient Instructions (Signed)
° ° ° °  If you have lab work done today you will be contacted with your lab results within the next 2 weeks.  If you have not heard from us then please contact us. The fastest way to get your results is to register for My Chart. ° ° °IF you received an x-ray today, you will receive an invoice from [facility]. Please contact [facility] at [phone] with questions or concerns regarding your invoice.  ° °IF you received labwork today, you will receive an invoice from [facility]. Please contact [facility] at [phone] with questions or concerns regarding your invoice.  ° °Our billing staff will not be able to assist you with questions regarding bills from these companies. ° °You will be contacted with the lab results as soon as they are available. The fastest way to get your results is to activate your My Chart account. Instructions are located on the last page of this paperwork. If you have not heard from us regarding the results in 2 weeks, please contact this office. °  ° ° ° °

## 2019-07-03 LAB — CBC WITH DIFFERENTIAL/PLATELET
Basophils Absolute: 0.1 10*3/uL (ref 0.0–0.2)
Basos: 1 %
EOS (ABSOLUTE): 0.1 10*3/uL (ref 0.0–0.4)
Eos: 1 %
Hematocrit: 35.9 % (ref 34.0–46.6)
Hemoglobin: 11.8 g/dL (ref 11.1–15.9)
Immature Grans (Abs): 0.1 10*3/uL (ref 0.0–0.1)
Immature Granulocytes: 1 %
Lymphocytes Absolute: 1.8 10*3/uL (ref 0.7–3.1)
Lymphs: 14 %
MCH: 28.2 pg (ref 26.6–33.0)
MCHC: 32.9 g/dL (ref 31.5–35.7)
MCV: 86 fL (ref 79–97)
Monocytes Absolute: 1.2 10*3/uL — ABNORMAL HIGH (ref 0.1–0.9)
Monocytes: 10 %
Neutrophils Absolute: 9.4 10*3/uL — ABNORMAL HIGH (ref 1.4–7.0)
Neutrophils: 73 %
Platelets: 518 10*3/uL — ABNORMAL HIGH (ref 150–450)
RBC: 4.18 x10E6/uL (ref 3.77–5.28)
RDW: 13.6 % (ref 11.7–15.4)
WBC: 12.7 10*3/uL — ABNORMAL HIGH (ref 3.4–10.8)

## 2019-07-03 LAB — LIPID PANEL
Chol/HDL Ratio: 5.2 ratio — ABNORMAL HIGH (ref 0.0–4.4)
Cholesterol, Total: 165 mg/dL (ref 100–199)
HDL: 32 mg/dL — ABNORMAL LOW (ref >39)
LDL Chol Calc (NIH): 98 mg/dL (ref 0–99)
Triglycerides: 200 mg/dL — ABNORMAL HIGH (ref 0–149)
VLDL Cholesterol Cal: 35 mg/dL (ref 5–40)

## 2019-07-03 LAB — COMPREHENSIVE METABOLIC PANEL
ALT: 13 IU/L (ref 0–32)
AST: 10 IU/L (ref 0–40)
Albumin/Globulin Ratio: 1.5 (ref 1.2–2.2)
Albumin: 3.9 g/dL (ref 3.8–4.8)
Alkaline Phosphatase: 140 IU/L — ABNORMAL HIGH (ref 39–117)
BUN/Creatinine Ratio: 14 (ref 12–28)
BUN: 10 mg/dL (ref 8–27)
Bilirubin Total: <0.2 mg/dL (ref 0.0–1.2)
CO2: 20 mmol/L (ref 20–29)
Calcium: 10 mg/dL (ref 8.7–10.3)
Chloride: 104 mmol/L (ref 96–106)
Creatinine, Ser: 0.72 mg/dL (ref 0.57–1.00)
GFR calc Af Amer: 105 mL/min/{1.73_m2} (ref >59)
GFR calc non Af Amer: 91 mL/min/{1.73_m2} (ref >59)
Globulin, Total: 2.6 g/dL (ref 1.5–4.5)
Glucose: 230 mg/dL — ABNORMAL HIGH (ref 65–99)
Potassium: 5 mmol/L (ref 3.5–5.2)
Sodium: 139 mmol/L (ref 134–144)
Total Protein: 6.5 g/dL (ref 6.0–8.5)

## 2019-07-03 LAB — HEMOGLOBIN A1C
Est. average glucose Bld gHb Est-mCnc: 203 mg/dL
Hgb A1c MFr Bld: 8.7 % — ABNORMAL HIGH (ref 4.8–5.6)

## 2019-07-04 ENCOUNTER — Encounter: Payer: Self-pay | Admitting: Registered Nurse

## 2019-07-04 NOTE — Progress Notes (Signed)
Results letter sent to patient via MyChart Will follow up in 6 mos with labs Plt elevated to [place], NP

## 2019-07-07 ENCOUNTER — Ambulatory Visit: Payer: PPO | Admitting: *Deleted

## 2019-07-08 ENCOUNTER — Other Ambulatory Visit: Payer: Self-pay | Admitting: Cardiology

## 2019-07-16 ENCOUNTER — Encounter: Payer: Self-pay | Admitting: Registered Nurse

## 2019-07-16 ENCOUNTER — Ambulatory Visit: Payer: Self-pay

## 2019-07-16 NOTE — Telephone Encounter (Signed)
Incoming call from Pt. Who state that she woke up this morning. Blood sugar was 315 then it went down . Pt.quested why  The variations in readings.  Pt made an appointment to have her medication reviewed and adjusted.             Reason for Disposition . [1] Blood glucose > 240 mg/dL (13.3 mmol/L) AND [2] rapid breathing  Answer Assessment - Initial Assessment Questions 1. BLOOD GLUCOSE: "What is your blood glucose level?"      315 2. ONSET: "When did you check the blood glucose?"     This am 3. USUAL RANGE: "What is your glucose level usually?" (e.g., usual fasting morning value, usual evening value)     4. KETONES: "Do you check for ketones (urine or blood test strips)?" If yes, ask: "What does the test show now?"      *No Answer* 5. TYPE 1 or 2:  "Do you know what type of diabetes you have?"  (e.g., Type 1, Type 2, Gestational; doesn't know)      Type 2 6. INSULIN: "Do you take insulin?" "What type of insulin(s) do you use? What is the mode of delivery? (syringe, pen; injection or pump)?"      denies 7. DIABETES PILLS: "Do you take any pills for your diabetes?" If yes, ask: "Have you missed taking any pills recently?"     yes 8. OTHER SYMPTOMS: "Do you have any symptoms?" (e.g., fever, frequent urination, difficulty breathing, dizziness, weakness, vomiting)    COPD , bad lungs  little  difficulty  9. PREGNANCY: "Is there any chance you are pregnant?" "When was your last menstrual period?"     Na.  Protocols used: DIABETES - HIGH BLOOD SUGAR-A-AH

## 2019-07-22 ENCOUNTER — Ambulatory Visit: Payer: Self-pay | Admitting: Registered Nurse

## 2019-07-23 DIAGNOSIS — J449 Chronic obstructive pulmonary disease, unspecified: Secondary | ICD-10-CM | POA: Diagnosis not present

## 2019-07-23 DIAGNOSIS — R200 Anesthesia of skin: Secondary | ICD-10-CM | POA: Diagnosis not present

## 2019-07-29 DIAGNOSIS — M6740 Ganglion, unspecified site: Secondary | ICD-10-CM | POA: Diagnosis not present

## 2019-07-29 DIAGNOSIS — M1811 Unilateral primary osteoarthritis of first carpometacarpal joint, right hand: Secondary | ICD-10-CM | POA: Diagnosis not present

## 2019-07-29 DIAGNOSIS — R200 Anesthesia of skin: Secondary | ICD-10-CM | POA: Diagnosis not present

## 2019-07-30 ENCOUNTER — Ambulatory Visit: Payer: Self-pay | Admitting: Registered Nurse

## 2019-08-12 ENCOUNTER — Telehealth: Payer: Self-pay | Admitting: Cardiology

## 2019-08-12 NOTE — Progress Notes (Signed)
Virtual Visit via Telephone Note   This visit type was conducted due to national recommendations for restrictions regarding the COVID-19 Pandemic (e.g. social distancing) in an effort to limit this patient's exposure and mitigate transmission in our community.  Due to her co-morbid illnesses, this patient is at least at moderate risk for complications without adequate follow up.  This format is felt to be most appropriate for this patient at this time.  The patient did not have access to video technology/had technical difficulties with video requiring transitioning to audio format only (telephone).  All issues noted in this document were discussed and addressed.  No physical exam could be performed with this format.  Please refer to the patient's chart for her  consent to telehealth for [facility].   Date:  08/13/2019   ID:  Tammy Mcpherson, DOB Jun 14, 1956, MRN [id number]  Patient Location: Home Provider Location: Office  PCP:  [name], NP  Cardiologist:  Rozann Lesches, MD Electrophysiologist:  None   Evaluation Performed:  Follow-Up Visit  Chief Complaint:  Cardiac follow-up  History of Present Illness:    Tammy Mcpherson is a 63 y.o. adult last seen in January.  We spoke by phone today.  She does not report any obvious angina symptoms or worsening shortness of breath with typical activities.  She is preparing to undergo hand surgery.  I reviewed her medications which are outlined below.  Cardiac regimen includes aspirin, Lipitor, bisoprolol, and losartan.  Echocardiogram from last year showed normal LVEF.  She has tolerated her medications well.  She does tell me that she was he was diagnosed with diabetes back in April.  She is currently on Metformin with follow-up by PCP.  The patient does not have symptoms concerning for COVID-19 infection (fever, chills, cough, or new shortness of breath).    Past Medical History:  Diagnosis Date   Anemia 04/30/2016   Anxiety     Blood transfusion without reported diagnosis    Cardiomyopathy ([place])    a. EF 40-45% by echo in 04/2016 b. Improved to 60-65% by repeat imaging in 2018   CHF (congestive heart failure) (HCC)    COPD (chronic obstructive pulmonary disease) (HCC)    Coronary artery calcification seen on CT scan    Essential hypertension    GERD (gastroesophageal reflux disease)    History of bronchitis    History of GI bleed    Hyperlipidemia    Iron deficiency anemia 05/12/2016   Bleeding ulcers   Pollen allergy    PSVT (paroxysmal supraventricular tachycardia) (HCC)    PVC's (premature ventricular contractions)    Past Surgical History:  Procedure Laterality Date   ABDOMINAL HYSTERECTOMY     polyps  about age 78   AIKEN OSTEOTOMY Right 07/10/2013   Procedure: Barbie Banner OSTEOTOMY RIGHT FOOT;  Surgeon: Marcheta Grammes, DPM;  Location: [facility];  Service: Orthopedics;  Laterality: Right;   AMPUTATION Left 05/09/2017   Procedure: AMPUTATION 5TH TOE LEFT FOOT;  Surgeon: [name], DPM;  Location: [facility];  Service: Podiatry;  Laterality: Left;   AMPUTATION Left 06/13/2017   Procedure: AMPUTATION 2ND TOE LEFT FOOT;  Surgeon: [name], DPM;  Location: [facility];  Service: Podiatry;  Laterality: Left;   BUNIONECTOMY Right 07/10/2013   Procedure: VOGLER BUNIONECTOMY RIGHT FOOT;  Surgeon: Marcheta Grammes, DPM;  Location: [facility];  Service: Orthopedics;  Laterality: Right;   CHOLECYSTECTOMY N/A 08/22/2017   Procedure: LAPAROSCOPIC CHOLECYSTECTOMY;  Surgeon: Virl Cagey, MD;  Location: [facility];  Service: General;  Laterality: N/A;   COLONOSCOPY N/A 07/07/2016   Dr. Oneida Alar; redundant left colon, diverticulosis at hepatic flexure, non-bleeding internal hemorrhoids   ESOPHAGOGASTRODUODENOSCOPY N/A 07/07/2016   Dr. Oneida Alar: many non-bleeding cratered gastric ulcers without stigmata of bleeding in gastric antrum. four 2-3 mm angioectasias without bleeding in duodenal bulb and  second portion of duodenum s/p APC. Chroni gastritis on path.    ESOPHAGOGASTRODUODENOSCOPY N/A 08/03/2017   Dr. Oneida Alar: erosive gastritis, AVMs. Found a single non-bleeding angioectasia in stomach, s/p APC therapy. Four non-bleeding angioectasias in duodenum s/p APC. Non-bleeding erosive gastropathy   IR ANGIOGRAM EXTREMITY LEFT  04/24/2017   IR FEM POP ART ATHERECT INC PTA MOD SED  04/24/2017   IR INFUSION THROMBOL ARTERIAL INITIAL (MS)  04/24/2017   IR RADIOLOGIST EVAL & MGMT  12/05/2016   IR US GUIDE VASC ACCESS RIGHT  04/24/2017   LIVER BIOPSY N/A 08/22/2017   Procedure: LIVER BIOPSY;  Surgeon: Virl Cagey, MD;  Location: [facility];  Service: General;  Laterality: N/A;   METATARSAL HEAD EXCISION Right 07/10/2013   Procedure: METATARSAL HEAD RESECTION OF DIGITS 2 AND 3 RIGHT FOOT;  Surgeon: Marcheta Grammes, DPM;  Location: [facility];  Service: Orthopedics;  Laterality: Right;   PROXIMAL INTERPHALANGEAL FUSION (PIP) Right 07/10/2013   Procedure: ARTHRODESIS PIPJ  2ND DIGIT RIGHT FOOT;  Surgeon: Marcheta Grammes, DPM;  Location: [facility];  Service: Orthopedics;  Laterality: Right;     Current Meds  Medication Sig   acetaminophen (TYLENOL) 500 MG tablet Take 500 mg by mouth at bedtime as needed for moderate pain.   albuterol (PROVENTIL) (2.5 MG/3ML) 0.083% nebulizer solution Take 3 mLs (2.5 mg total) by nebulization every 4 (four) hours as needed for wheezing or shortness of breath.   aspirin 81 MG chewable tablet Chew 81 mg by mouth every morning.    atorvastatin (LIPITOR) 20 MG tablet TAKE ONE TABLET (20MG TOTAL) BY MOUTH DAILY (Patient taking differently: Take 20 mg by mouth every morning. )   bisoprolol (ZEBETA) 10 MG tablet Take 1 tablet (10 mg total) by mouth daily.   Blood Glucose Monitoring Suppl (ONE TOUCH ULTRA 2) w/Device KIT    Continuous Blood Gluc Receiver (FREESTYLE LIBRE 14 DAY READER) DEVI See admin instructions.   Continuous Blood Gluc Sensor (FREESTYLE  LIBRE SENSOR SYSTEM) MISC Use as directed   cyanocobalamin (,VITAMIN B-12,) 1000 MCG/ML injection Inject 1 mL (1,000 mcg total) into the muscle every 30 (thirty) days.   Fluticasone-Umeclidin-Vilant (TRELEGY ELLIPTA) 100-62.5-25 MCG/INH AEPB Inhale 1 puff into the lungs daily.   gabapentin (NEURONTIN) 300 MG capsule Take 1 capsule (300 mg total) by mouth at bedtime.   glucose blood (ONETOUCH ULTRA) test strip Use as instructed   glucose monitoring kit (FREESTYLE) monitoring kit Use to check blood sugar BID as directed   Lancets (ONETOUCH DELICA PLUS [id number]) MISC 1 each by Does not apply route 2 (two) times a day.   losartan (COZAAR) 100 MG tablet TAKE ONE (1) TABLET BY MOUTH EVERY DAY   metFORMIN (GLUCOPHAGE XR) 500 MG 24 hr tablet Take 2 tablets (1,000 mg total) by mouth daily with breakfast.   Misc. Devices MISC Please provide supplies (needle, syringe, alcohol swabs) needed for patient to self-administer B-12 injections monthly.   nicotine polacrilex (COMMIT) 4 MG lozenge Take 1 lozenge (4 mg total) by mouth as needed for smoking cessation.   pantoprazole (PROTONIX) 40 MG tablet TAKE ONE (1) TABLET BY MOUTH EVERY DAY (Patient  taking differently: Take 40 mg by mouth every morning. TAKE ONE (1) TABLET BY MOUTH EVERY DAY)   [DISCONTINUED] albuterol (PROAIR HFA) 108 (90 Base) MCG/ACT inhaler INHALE 2 PUFFS INTO THE LUNGS EVERY 6 HOURS AS NEEDED FOR WHEEZING OR SHORTNESS OF BREATH.   [DISCONTINUED] colchicine 0.6 MG tablet 2 tab po x 1 dose then 1 tab 1hr later. Continue 1 tab po BID x 5 days   [DISCONTINUED] varenicline (CHANTIX PAK) 0.5 MG X 11 & 1 MG X 42 tablet Take one 0.5 mg tablet by mouth once daily for 3 days, then increase to one 0.5 mg tablet twice daily for 4 days, then increase to one 1 mg tablet twice daily.     Allergies:   Bee venom, Codeine, Penicillins, Bupropion, and Sudafed [pseudoephedrine hcl]   Social History   Tobacco Use   Smoking status: Current Every  Day Smoker    Packs/day: 1.00    Years: 44.00    Pack years: 44.00    Types: Cigarettes    Start date: 05/10/1975   Smokeless tobacco: Never Used   Tobacco comment: pack per day 1.15.20  Substance Use Topics   Alcohol use: Yes    Alcohol/week: 2.0 standard drinks    Types: 2 Glasses of wine per week   Drug use: No     Family Hx: The patient's family history includes Coronary artery disease in her sister; Hypertension in her father; Ulcers in her maternal grandmother. There is no history of Colon cancer. She was adopted.  ROS:   Please see the history of present illness. All other systems reviewed and are negative.   Prior CV studies:   The following studies were reviewed today:  Echocardiogram 03/20/2018: Study Conclusions  - Left ventricle: The cavity size was normal. Wall thickness was increased in a pattern of mild LVH. Systolic function was vigorous. The estimated ejection fraction was in the range of 65% to 70%. Wall motion was normal; there were no regional wall motion abnormalities. Doppler parameters are consistent with abnormal left ventricular relaxation (grade 1 diastolic dysfunction). Doppler parameters are consistent with indeterminate ventricular filling pressure. - Mitral valve: Mildly thickened leaflets .  Impressions:  - LV systolic function is vigorous with grade 1 diastolic dysfuntion. No significant changes when compared to study dated 08/13/2017.  Labs/Other Tests and Data Reviewed:    EKG:  An ECG dated 03/14/2018 was personally reviewed today and demonstrated:  Normal sinus rhythm.  Recent Labs: 07/02/2019: ALT 13; BUN 10; Creatinine, Ser 0.72; Hemoglobin 11.8; Platelets 518; Potassium 5.0; Sodium 139   Recent Lipid Panel Lab Results  Component Value Date/Time   CHOL 165 07/02/2019 03:39 PM   TRIG 200 (H) 07/02/2019 03:39 PM   HDL 32 (L) 07/02/2019 03:39 PM   CHOLHDL 5.2 (H) 07/02/2019 03:39 PM   CHOLHDL 4.4  09/28/2017 12:01 PM   LDLCALC 98 07/02/2019 03:39 PM   LDLCALC 88 09/28/2017 12:01 PM    Wt Readings from Last 3 Encounters:  08/13/19 154 lb (69.9 kg)  07/02/19 157 lb (71.2 kg)  05/23/19 145 lb (65.8 kg)     Objective:    Vital Signs:  BP (!) 153/63    Ht '5\' 4"'  (1.626 m)    Wt 154 lb (69.9 kg)    BMI 26.43 kg/m    Patient spoke in full sentences, not short of breath. No audible wheezing or coughing. Speech pattern normal.  ASSESSMENT & PLAN:    1.  History of nonischemic cardiomyopathy with  normalization of LVEF, 65 to 70% by echocardiogram last year.  She does not report any increasing shortness of breath or unusual weight gain.  Continue current medications including Cozaar and Zebeta.  2.  Longstanding tobacco abuse.  Smoking cessation has been discussed over time.  Continue to readdress.  3.  Essential hypertension, systolic in the 998S today.  Continue with current medications and keep follow-up with PCP.  COVID-19 Education: The signs and symptoms of COVID-19 were discussed with the patient and how to seek care for testing (follow up with PCP or arrange E-visit).  The importance of social distancing was discussed today.  Time:   Today, I have spent 7 minutes with the patient with telehealth technology discussing the above problems.     Medication Adjustments/Labs and Tests Ordered: Current medicines are reviewed at length with the patient today.  Concerns regarding medicines are outlined above.   Tests Ordered: No orders of the defined types were placed in this encounter.   Medication Changes: No orders of the defined types were placed in this encounter.   Follow Up:  In Person 6 months in the [place] office.  Signed, Rozann Lesches, MD  08/13/2019 11:07 AM    [place]

## 2019-08-12 NOTE — Telephone Encounter (Signed)
Virtual Visit Pre-Appointment Phone Call  "(Name), I am calling you today to discuss your upcoming appointment. We are currently trying to limit exposure to the virus that causes COVID-19 by seeing patients at home rather than in the office."  1. "What is the BEST phone number to call the day of the visit?" -  [phone]  2. Do you have or have access to (through a family member/friend) a smartphone with video capability that we can use for your visit?" a. If yes - list this number in appt notes as cell (if different from BEST phone #) and list the appointment type as a VIDEO visit in appointment notes b. If no - list the appointment type as a PHONE visit in appointment notes  3. Confirm consent - "In the setting of the current Covid19 crisis, you are scheduled for a (phone or video) visit with your provider on (date) at (time).  Just as we do with many in-office visits, in order for you to participate in this visit, we must obtain consent.  If you'd like, I can send this to your mychart (if signed up) or email for you to review.  Otherwise, I can obtain your verbal consent now.  All virtual visits are billed to your insurance company just like a normal visit would be.  By agreeing to a virtual visit, we'd like you to understand that the technology does not allow for your provider to perform an examination, and thus may limit your provider's ability to fully assess your condition. If your provider identifies any concerns that need to be evaluated in person, we will make arrangements to do so.  Finally, though the technology is pretty good, we cannot assure that it will always work on either your or our end, and in the setting of a video visit, we may have to convert it to a phone-only visit.  In either situation, we cannot ensure that we have a secure connection.  Are you willing to proceed?" STAFF: Did the patient verbally acknowledge consent to telehealth visit? Document YES/NO here: YES    4. Advise patient to be prepared - "Two hours prior to your appointment, go ahead and check your blood pressure, pulse, oxygen saturation, and your weight (if you have the equipment to check those) and write them all down. When your visit starts, your provider will ask you for this information. If you have an Apple Watch or Kardia device, please plan to have heart rate information ready on the day of your appointment. Please have a pen and paper handy nearby the day of the visit as well."  5. Give patient instructions for MyChart download to smartphone OR [email or web address] as below if video visit (depending on what platform provider is using)  6. Inform patient they will receive a phone call 15 minutes prior to their appointment time (may be from unknown caller ID) so they should be prepared to answer    TELEPHONE CALL NOTE  Tammy Mcpherson has been deemed a candidate for a follow-up tele-health visit to limit community exposure during the Covid-19 pandemic. I spoke with the patient via phone to ensure availability of phone/video source, confirm preferred email & phone number, and discuss instructions and expectations.  I reminded Tammy Mcpherson to be prepared with any vital sign and/or heart rhythm information that could potentially be obtained via home monitoring, at the time of her visit. I reminded Tammy Mcpherson to expect a phone call prior to her visit.  Lynnda Child Slaughter 08/12/2019 10:45 AM   INSTRUCTIONS FOR DOWNLOADING THE MYCHART APP TO SMARTPHONE  - The patient must first make sure to have activated MyChart and know their login information - If Apple, go to [employer] and type in MyChart in the search bar and download the app. If Android, ask patient to go to [employer] and type in [place] in the search bar and download the app. The app is free but as with any other app downloads, their phone may require them to verify saved payment information or Apple/Android password.  - The  patient will need to then log into the app with their MyChart username and password, and select [facility] as their healthcare provider to link the account. When it is time for your visit, go to the MyChart app, find appointments, and click Begin Video Visit. Be sure to Select Allow for your device to access the Microphone and Camera for your visit. You will then be connected, and your provider will be with you shortly.  **If they have any issues connecting, or need assistance please contact MyChart service desk [phone])**  **If using a computer, in order to ensure the best quality for their visit they will need to use either of the following Internet Browsers: [employer], or Google Chrome**  IF USING DOXIMITY or DOXY.ME - The patient will receive a link just prior to their visit by text.     FULL LENGTH CONSENT FOR TELE-HEALTH VISIT   I hereby voluntarily request, consent and authorize [facility] and its employed or contracted physicians, physician assistants, nurse practitioners or other licensed health care professionals (the Practitioner), to provide me with telemedicine health care services (the Services") as deemed necessary by the treating Practitioner. I acknowledge and consent to receive the Services by the Practitioner via telemedicine. I understand that the telemedicine visit will involve communicating with the Practitioner through live audiovisual communication technology and the disclosure of certain medical information by electronic transmission. I acknowledge that I have been given the opportunity to request an in-person assessment or other available alternative prior to the telemedicine visit and am voluntarily participating in the telemedicine visit.  I understand that I have the right to withhold or withdraw my consent to the use of telemedicine in the course of my care at any time, without affecting my right to future care or treatment, and that the  Practitioner or I may terminate the telemedicine visit at any time. I understand that I have the right to inspect all information obtained and/or recorded in the course of the telemedicine visit and may receive copies of available information for a reasonable fee.  I understand that some of the potential risks of receiving the Services via telemedicine include:   Delay or interruption in medical evaluation due to technological equipment failure or disruption;  Information transmitted may not be sufficient (e.g. poor resolution of images) to allow for appropriate medical decision making by the Practitioner; and/or   In rare instances, security protocols could fail, causing a breach of personal health information.  Furthermore, I acknowledge that it is my responsibility to provide information about my medical history, conditions and care that is complete and accurate to the best of my ability. I acknowledge that Practitioner's advice, recommendations, and/or decision may be based on factors not within their control, such as incomplete or inaccurate data provided by me or distortions of diagnostic images or specimens that may result from electronic transmissions. I understand that the  practice of medicine is not an Chief Strategy Officer and that Practitioner makes no warranties or guarantees regarding treatment outcomes. I acknowledge that I will receive a copy of this consent concurrently upon execution via email to the email address I last provided but may also request a printed copy by calling the office of [facility].    I understand that my insurance will be billed for this visit.   I have read or had this consent read to me.  I understand the contents of this consent, which adequately explains the benefits and risks of the Services being provided via telemedicine.   I have been provided ample opportunity to ask questions regarding this consent and the Services and have had my questions answered to my  satisfaction.  I give my informed consent for the services to be provided through the use of telemedicine in my medical care  By participating in this telemedicine visit I agree to the above.

## 2019-08-13 ENCOUNTER — Encounter: Payer: Self-pay | Admitting: Cardiology

## 2019-08-13 ENCOUNTER — Telehealth (INDEPENDENT_AMBULATORY_CARE_PROVIDER_SITE_OTHER): Payer: PPO | Admitting: Cardiology

## 2019-08-13 VITALS — BP 153/63 | Ht 64.0 in | Wt 154.0 lb

## 2019-08-13 DIAGNOSIS — Z8679 Personal history of other diseases of the circulatory system: Secondary | ICD-10-CM | POA: Diagnosis not present

## 2019-08-13 DIAGNOSIS — Z720 Tobacco use: Secondary | ICD-10-CM | POA: Diagnosis not present

## 2019-08-13 DIAGNOSIS — I10 Essential (primary) hypertension: Secondary | ICD-10-CM

## 2019-08-13 DIAGNOSIS — E782 Mixed hyperlipidemia: Secondary | ICD-10-CM

## 2019-08-13 NOTE — Patient Instructions (Signed)

## 2019-08-15 DIAGNOSIS — M1811 Unilateral primary osteoarthritis of first carpometacarpal joint, right hand: Secondary | ICD-10-CM | POA: Diagnosis not present

## 2019-08-15 DIAGNOSIS — M24841 Other specific joint derangements of right hand, not elsewhere classified: Secondary | ICD-10-CM | POA: Diagnosis not present

## 2019-08-15 DIAGNOSIS — G5601 Carpal tunnel syndrome, right upper limb: Secondary | ICD-10-CM | POA: Diagnosis not present

## 2019-08-15 DIAGNOSIS — M19041 Primary osteoarthritis, right hand: Secondary | ICD-10-CM | POA: Diagnosis not present

## 2019-08-19 ENCOUNTER — Ambulatory Visit (INDEPENDENT_AMBULATORY_CARE_PROVIDER_SITE_OTHER): Payer: PPO | Admitting: Registered Nurse

## 2019-08-19 ENCOUNTER — Encounter: Payer: Self-pay | Admitting: Registered Nurse

## 2019-08-19 ENCOUNTER — Other Ambulatory Visit: Payer: Self-pay

## 2019-08-19 VITALS — BP 166/74 | HR 93 | Temp 98.1°F | Resp 16 | Wt 155.0 lb

## 2019-08-19 DIAGNOSIS — E1165 Type 2 diabetes mellitus with hyperglycemia: Secondary | ICD-10-CM

## 2019-08-19 LAB — HEMOGLOBIN A1C
Est. average glucose Bld gHb Est-mCnc: 203 mg/dL
Hgb A1c MFr Bld: 8.7 % — ABNORMAL HIGH (ref 4.8–5.6)

## 2019-08-19 MED ORDER — METFORMIN HCL 1000 MG PO TABS: 1000.0000 mg | ORAL_TABLET | Freq: Two times a day (BID) | ORAL | 3 refills | Status: DC

## 2019-08-19 NOTE — Progress Notes (Signed)
Established Patient Office Visit  Subjective:  [name], adult    DOB: 1955/10/14  Age: 63 y.o. MRN: [id number]  CC:  Chief Complaint  Patient presents with  . Medication Management    pt states she had surgery on her hand Friday and they informed her that her glucose levels has been abnormal and she needed to follow-up.   . Diabetes    HPI Tammy Mcpherson presents for med check  She had surgery on her hand on Friday, and that provider noted that she had an elevated blood glucose and suggested follow up with her PCP She does check her sugars regularly - states that they are low 100s on waking, low 300s postprandial. Highest at home has been 380. No symptoms of DKA at that time - unfortunately, she is familiar with these symptoms as she was in DKA when she was first diagnosed with T2DM.  Feels well for the most part - recovering from surgery well and showing strong investment in her care and health.   Past Medical History:  Diagnosis Date  . Anemia 04/30/2016  . Anxiety   . Blood transfusion without reported diagnosis   . Cardiomyopathy ([place])    a. EF 40-45% by echo in 04/2016 b. Improved to 60-65% by repeat imaging in 2018  . CHF (congestive heart failure) ([place])   . COPD (chronic obstructive pulmonary disease) ([place])   . Coronary artery calcification seen on CT scan   . Essential hypertension   . GERD (gastroesophageal reflux disease)   . History of bronchitis   . History of GI bleed   . Hyperlipidemia   . Iron deficiency anemia 05/12/2016   Bleeding ulcers  . Pollen allergy   . PSVT (paroxysmal supraventricular tachycardia) ([place])   . PVC's (premature ventricular contractions)     Past Surgical History:  Procedure Laterality Date  . ABDOMINAL HYSTERECTOMY     polyps  about age 31  . Barbie Banner OSTEOTOMY Right 07/10/2013   Procedure: Barbie Banner OSTEOTOMY RIGHT FOOT;  Surgeon: Marcheta Grammes, DPM;  Location: [facility];  Service: Orthopedics;  Laterality: Right;   . AMPUTATION Left 05/09/2017   Procedure: AMPUTATION 5TH TOE LEFT FOOT;  Surgeon: [name], DPM;  Location: [facility];  Service: Podiatry;  Laterality: Left;  . AMPUTATION Left 06/13/2017   Procedure: AMPUTATION 2ND TOE LEFT FOOT;  Surgeon: [name], DPM;  Location: [facility];  Service: Podiatry;  Laterality: Left;  . BUNIONECTOMY Right 07/10/2013   Procedure: VOGLER BUNIONECTOMY RIGHT FOOT;  Surgeon: Marcheta Grammes, DPM;  Location: [facility];  Service: Orthopedics;  Laterality: Right;  . CHOLECYSTECTOMY N/A 08/22/2017   Procedure: LAPAROSCOPIC CHOLECYSTECTOMY;  Surgeon: Virl Cagey, MD;  Location: [facility];  Service: General;  Laterality: N/A;  . COLONOSCOPY N/A 07/07/2016   Dr. Oneida Alar; redundant left colon, diverticulosis at hepatic flexure, non-bleeding internal hemorrhoids  . ESOPHAGOGASTRODUODENOSCOPY N/A 07/07/2016   Dr. Oneida Alar: many non-bleeding cratered gastric ulcers without stigmata of bleeding in gastric antrum. four 2-3 mm angioectasias without bleeding in duodenal bulb and second portion of duodenum s/p APC. Chroni gastritis on path.   . ESOPHAGOGASTRODUODENOSCOPY N/A 08/03/2017   Dr. Oneida Alar: erosive gastritis, AVMs. Found a single non-bleeding angioectasia in stomach, s/p APC therapy. Four non-bleeding angioectasias in duodenum s/p APC. Non-bleeding erosive gastropathy  . IR ANGIOGRAM EXTREMITY LEFT  04/24/2017  . IR FEM POP ART ATHERECT INC PTA MOD SED  04/24/2017  . IR INFUSION THROMBOL ARTERIAL INITIAL (MS)  04/24/2017  .  IR RADIOLOGIST EVAL & MGMT  12/05/2016  . IR US GUIDE VASC ACCESS RIGHT  04/24/2017  . LIVER BIOPSY N/A 08/22/2017   Procedure: LIVER BIOPSY;  Surgeon: Virl Cagey, MD;  Location: [facility];  Service: General;  Laterality: N/A;  . METATARSAL HEAD EXCISION Right 07/10/2013   Procedure: METATARSAL HEAD RESECTION OF DIGITS 2 AND 3 RIGHT FOOT;  Surgeon: Marcheta Grammes, DPM;  Location: [facility];  Service: Orthopedics;  Laterality: Right;  .  PROXIMAL INTERPHALANGEAL FUSION (PIP) Right 07/10/2013   Procedure: ARTHRODESIS PIPJ  2ND DIGIT RIGHT FOOT;  Surgeon: Marcheta Grammes, DPM;  Location: [facility];  Service: Orthopedics;  Laterality: Right;    Family History  Adopted: Yes  Problem Relation Age of Onset  . Hypertension Father   . Coronary artery disease Sister   . Ulcers Maternal Grandmother   . Colon cancer Neg Hx        unknown, was adopted    Social History   Socioeconomic History  . Marital status: Married    Spouse name: [name]  . Number of children: 1  . Years of education: 49  . Highest education level: Not on file  Occupational History  . Occupation: disabled    Comment: heart  Social Needs  . Financial resource strain: Not on file  . Food insecurity    Worry: Not on file    Inability: Not on file  . Transportation needs    Medical: Not on file    Non-medical: Not on file  Tobacco Use  . Smoking status: Current Every Day Smoker    Packs/day: 1.00    Years: 44.00    Pack years: 44.00    Types: Cigarettes    Start date: 05/10/1975  . Smokeless tobacco: Never Used  . Tobacco comment: pack per day 1.15.20  Substance and Sexual Activity  . Alcohol use: Yes    Alcohol/week: 2.0 standard drinks    Types: 2 Glasses of wine per week  . Drug use: No  . Sexual activity: Yes    Birth control/protection: Surgical  Lifestyle  . Physical activity    Days per week: Not on file    Minutes per session: Not on file  . Stress: Not on file  Relationships  . Social Herbalist on phone: Not on file    Gets together: Not on file    Attends religious service: Not on file    Active member of club or organization: Not on file    Attends meetings of clubs or organizations: Not on file    Relationship status: Not on file  . Intimate partner violence    Fear of current or ex partner: Not on file    Emotionally abused: Not on file    Physically abused: Not on file    Forced sexual activity: Not on  file  Other Topics Concern  . Not on file  Social History Narrative   Disabled   Lives with husband [name]   Two dogs    Outpatient Medications Prior to Visit  Medication Sig Dispense Refill  . acetaminophen (TYLENOL) 500 MG tablet Take 500 mg by mouth at bedtime as needed for moderate pain.    Marland Kitchen albuterol (PROVENTIL) (2.5 MG/3ML) 0.083% nebulizer solution Take 3 mLs (2.5 mg total) by nebulization every 4 (four) hours as needed for wheezing or shortness of breath. 75 mL 5  . aspirin 81 MG chewable tablet Chew 81 mg by mouth every morning.     Marland Kitchen  atorvastatin (LIPITOR) 20 MG tablet TAKE ONE TABLET ('20MG'$  TOTAL) BY MOUTH DAILY (Patient taking differently: Take 20 mg by mouth every morning. ) 90 tablet 3  . bisoprolol (ZEBETA) 10 MG tablet Take 1 tablet (10 mg total) by mouth daily. 90 tablet 3  . Blood Glucose Monitoring Suppl (ONE TOUCH ULTRA 2) w/Device KIT     . Continuous Blood Gluc Receiver (FREESTYLE LIBRE 14 DAY READER) DEVI See admin instructions.    . Continuous Blood Gluc Sensor (FREESTYLE LIBRE SENSOR SYSTEM) MISC Use as directed 1 each 0  . cyanocobalamin (,VITAMIN B-12,) 1000 MCG/ML injection Inject 1 mL (1,000 mcg total) into the muscle every 30 (thirty) days. 1 mL 11  . Fluticasone-Umeclidin-Vilant (TRELEGY ELLIPTA) 100-62.5-25 MCG/INH AEPB Inhale 1 puff into the lungs daily. 60 each 6  . gabapentin (NEURONTIN) 300 MG capsule Take 1 capsule (300 mg total) by mouth at bedtime. 90 capsule 3  . glucose blood (ONETOUCH ULTRA) test strip Use as instructed 100 each 12  . glucose monitoring kit (FREESTYLE) monitoring kit Use to check blood sugar BID as directed 1 each 0  . Lancets (ONETOUCH DELICA PLUS [id number]) MISC 1 each by Does not apply route 2 (two) times a day. 100 each 3  . losartan (COZAAR) 100 MG tablet TAKE ONE (1) TABLET BY MOUTH EVERY DAY 90 tablet 0  . metFORMIN (GLUCOPHAGE XR) 500 MG 24 hr tablet Take 2 tablets (1,000 mg total) by mouth daily with breakfast. 90 tablet 3   . Misc. Devices MISC Please provide supplies (needle, syringe, alcohol swabs) needed for patient to self-administer B-12 injections monthly. 1 each 12  . nicotine polacrilex (COMMIT) 4 MG lozenge Take 1 lozenge (4 mg total) by mouth as needed for smoking cessation. 100 tablet 3  . oxyCODONE (OXY IR/ROXICODONE) 5 MG immediate release tablet Take 0-1 tablet every 6 hours as needed only for severe postop pain    . pantoprazole (PROTONIX) 40 MG tablet TAKE ONE (1) TABLET BY MOUTH EVERY DAY (Patient taking differently: Take 40 mg by mouth every morning. TAKE ONE (1) TABLET BY MOUTH EVERY DAY) 90 tablet 3  . traMADol (ULTRAM) 50 MG tablet Take 50 mg by mouth every 6 (six) hours as needed.     No facility-administered medications prior to visit.     Allergies  Allergen Reactions  . Bee Venom Swelling  . Codeine Anaphylaxis    Tongue swelled  . Penicillins Swelling and Rash    Has patient had a PCN reaction causing immediate rash, facial/tongue/throat swelling, SOB or lightheadedness with hypotension: No, delayed Has patient had a PCN reaction causing severe rash involving mucus membranes or skin necrosis: No Has patient had a PCN reaction that required hospitalization No Has patient had a PCN reaction occurring within the last 10 years: No If all of the above answers are "NO", then may proceed with Cephalosporin use.   . Bupropion Other (See Comments)    "Made my skin crawl"  . Sudafed [Pseudoephedrine Hcl] Rash    ROS Review of Systems Per hpi    Objective:    Physical Exam  Constitutional: She is oriented to person, place, and time. She appears well-developed and well-nourished. No distress.  Cardiovascular: Normal rate and regular rhythm.  Pulmonary/Chest: Effort normal. No respiratory distress.  Neurological: She is alert and oriented to person, place, and time.  Skin: Skin is warm and dry. No rash noted. She is not diaphoretic. No erythema. No pallor.  Psychiatric: She has a  normal  mood and affect. Her behavior is normal. Judgment and thought content normal.  Nursing note and vitals reviewed.   BP (!) 166/74   Pulse 93   Temp 98.1 F (36.7 C) (Oral)   Resp 16   Wt 155 lb (70.3 kg)   SpO2 93%   BMI 26.61 kg/m  Wt Readings from Last 3 Encounters:  08/19/19 155 lb (70.3 kg)  08/13/19 154 lb (69.9 kg)  07/02/19 157 lb (71.2 kg)     Health Maintenance Due  Topic Date Due  . FOOT EXAM  09/01/1967  . OPHTHALMOLOGY EXAM  09/01/1967  . HIV Screening  08/31/1972  . TETANUS/TDAP  08/31/1976  . PAP SMEAR-Modifier  08/31/1978  . MAMMOGRAM  09/01/2007    There are no preventive care reminders to display for this patient.  Lab Results  Component Value Date   TSH 1.727 04/30/2016   Lab Results  Component Value Date   WBC 12.7 (H) 07/02/2019   HGB 11.8 07/02/2019   HCT 35.9 07/02/2019   MCV 86 07/02/2019   PLT 518 (H) 07/02/2019   Lab Results  Component Value Date   NA 139 07/02/2019   K 5.0 07/02/2019   CO2 20 07/02/2019   GLUCOSE 230 (H) 07/02/2019   BUN 10 07/02/2019   CREATININE 0.72 07/02/2019   BILITOT <0.2 07/02/2019   ALKPHOS 140 (H) 07/02/2019   AST 10 07/02/2019   ALT 13 07/02/2019   PROT 6.5 07/02/2019   ALBUMIN 3.9 07/02/2019   CALCIUM 10.0 07/02/2019   ANIONGAP 11 06/02/2019   Lab Results  Component Value Date   CHOL 165 07/02/2019   Lab Results  Component Value Date   HDL 32 (L) 07/02/2019   Lab Results  Component Value Date   LDLCALC 98 07/02/2019   Lab Results  Component Value Date   TRIG 200 (H) 07/02/2019   Lab Results  Component Value Date   CHOLHDL 5.2 (H) 07/02/2019   Lab Results  Component Value Date   HGBA1C 8.7 (H) 07/02/2019      Assessment & Plan:   Problem List Items Addressed This Visit    None    Visit Diagnoses    Uncontrolled type 2 diabetes mellitus with hyperglycemia ([place])    -  Primary   Relevant Medications   metFORMIN (GLUCOPHAGE) 1000 MG tablet   Other Relevant Orders    Hemoglobin A1c      Meds ordered this encounter  Medications  . metFORMIN (GLUCOPHAGE) 1000 MG tablet    Sig: Take 1 tablet (1,000 mg total) by mouth 2 (two) times daily with a meal.    Dispense:  180 tablet    Refill:  3    Order Specific Question:   Supervising Provider    Answer:   Forrest Moron [id number]    Follow-up: Return in about 3 months (around 11/19/2019) for a1c check, med check w provider.   PLAN  Increase dose of metformin to 1064m PO bid.   Return in 3 mos for a1c med check provider visit.   Discussed some pathophysiology of diabetes - ideally her postprandial glucose will be lower - would prefer to see 200-250 range rather than any 300s.   Discussed dietary modifications to address high blood glucose.  Patient encouraged to call clinic with any questions, comments, or concerns.   RMaximiano Coss NP

## 2019-08-19 NOTE — Patient Instructions (Signed)
° ° ° °  If you have lab work done today you will be contacted with your lab results within the next 2 weeks.  If you have not heard from us then please contact us. The fastest way to get your results is to register for My Chart. ° ° °IF you received an x-ray today, you will receive an invoice from [facility]. Please contact [facility] at [phone] with questions or concerns regarding your invoice.  ° °IF you received labwork today, you will receive an invoice from [facility]. Please contact [facility] at [phone] with questions or concerns regarding your invoice.  ° °Our billing staff will not be able to assist you with questions regarding bills from these companies. ° °You will be contacted with the lab results as soon as they are available. The fastest way to get your results is to activate your My Chart account. Instructions are located on the last page of this paperwork. If you have not heard from us regarding the results in 2 weeks, please contact this office. °  ° ° ° °

## 2019-08-23 DIAGNOSIS — J449 Chronic obstructive pulmonary disease, unspecified: Secondary | ICD-10-CM | POA: Diagnosis not present

## 2019-09-01 DIAGNOSIS — M25641 Stiffness of right hand, not elsewhere classified: Secondary | ICD-10-CM | POA: Diagnosis not present

## 2019-09-01 DIAGNOSIS — M1811 Unilateral primary osteoarthritis of first carpometacarpal joint, right hand: Secondary | ICD-10-CM | POA: Diagnosis not present

## 2019-09-02 ENCOUNTER — Other Ambulatory Visit (HOSPITAL_COMMUNITY): Payer: PPO

## 2019-09-02 DIAGNOSIS — G5601 Carpal tunnel syndrome, right upper limb: Secondary | ICD-10-CM | POA: Diagnosis not present

## 2019-09-03 ENCOUNTER — Other Ambulatory Visit: Payer: Self-pay

## 2019-09-03 ENCOUNTER — Inpatient Hospital Stay (HOSPITAL_COMMUNITY): Payer: PPO | Attending: Hematology

## 2019-09-03 DIAGNOSIS — Z8719 Personal history of other diseases of the digestive system: Secondary | ICD-10-CM | POA: Diagnosis not present

## 2019-09-03 DIAGNOSIS — K922 Gastrointestinal hemorrhage, unspecified: Secondary | ICD-10-CM | POA: Insufficient documentation

## 2019-09-03 DIAGNOSIS — Z79899 Other long term (current) drug therapy: Secondary | ICD-10-CM | POA: Insufficient documentation

## 2019-09-03 DIAGNOSIS — F17210 Nicotine dependence, cigarettes, uncomplicated: Secondary | ICD-10-CM | POA: Diagnosis not present

## 2019-09-03 DIAGNOSIS — D500 Iron deficiency anemia secondary to blood loss (chronic): Secondary | ICD-10-CM | POA: Diagnosis not present

## 2019-09-03 DIAGNOSIS — Z7289 Other problems related to lifestyle: Secondary | ICD-10-CM | POA: Insufficient documentation

## 2019-09-03 DIAGNOSIS — R42 Dizziness and giddiness: Secondary | ICD-10-CM | POA: Diagnosis not present

## 2019-09-03 DIAGNOSIS — D72829 Elevated white blood cell count, unspecified: Secondary | ICD-10-CM | POA: Diagnosis not present

## 2019-09-03 DIAGNOSIS — E785 Hyperlipidemia, unspecified: Secondary | ICD-10-CM | POA: Insufficient documentation

## 2019-09-03 DIAGNOSIS — J449 Chronic obstructive pulmonary disease, unspecified: Secondary | ICD-10-CM | POA: Diagnosis not present

## 2019-09-03 DIAGNOSIS — Z8249 Family history of ischemic heart disease and other diseases of the circulatory system: Secondary | ICD-10-CM | POA: Insufficient documentation

## 2019-09-03 DIAGNOSIS — Z7902 Long term (current) use of antithrombotics/antiplatelets: Secondary | ICD-10-CM | POA: Insufficient documentation

## 2019-09-03 DIAGNOSIS — E119 Type 2 diabetes mellitus without complications: Secondary | ICD-10-CM | POA: Insufficient documentation

## 2019-09-03 DIAGNOSIS — I429 Cardiomyopathy, unspecified: Secondary | ICD-10-CM | POA: Insufficient documentation

## 2019-09-03 DIAGNOSIS — E538 Deficiency of other specified B group vitamins: Secondary | ICD-10-CM | POA: Insufficient documentation

## 2019-09-03 LAB — COMPREHENSIVE METABOLIC PANEL
ALT: 15 U/L (ref 0–44)
AST: 16 U/L (ref 15–41)
Albumin: 3.5 g/dL (ref 3.5–5.0)
Alkaline Phosphatase: 105 U/L (ref 38–126)
Anion gap: 11 (ref 5–15)
BUN: 10 mg/dL (ref 8–23)
CO2: 22 mmol/L (ref 22–32)
Calcium: 8.8 mg/dL — ABNORMAL LOW (ref 8.9–10.3)
Chloride: 103 mmol/L (ref 98–111)
Creatinine, Ser: 0.7 mg/dL (ref 0.44–1.00)
GFR calc Af Amer: >60 mL/min (ref >60)
GFR calc non Af Amer: >60 mL/min (ref >60)
Glucose, Bld: 238 mg/dL — ABNORMAL HIGH (ref 70–99)
Potassium: 4 mmol/L (ref 3.5–5.1)
Sodium: 136 mmol/L (ref 135–145)
Total Bilirubin: 0.4 mg/dL (ref 0.3–1.2)
Total Protein: 7.1 g/dL (ref 6.5–8.1)

## 2019-09-03 LAB — CBC WITH DIFFERENTIAL/PLATELET
Abs Immature Granulocytes: 0.09 10*3/uL — ABNORMAL HIGH (ref 0.00–0.07)
Basophils Absolute: 0.1 10*3/uL (ref 0.0–0.1)
Basophils Relative: 1 %
Eosinophils Absolute: 0.2 10*3/uL (ref 0.0–0.5)
Eosinophils Relative: 2 %
HCT: 37.5 % (ref 36.0–46.0)
Hemoglobin: 11 g/dL — ABNORMAL LOW (ref 12.0–15.0)
Immature Granulocytes: 1 %
Lymphocytes Relative: 12 %
Lymphs Abs: 1.8 10*3/uL (ref 0.7–4.0)
MCH: 24.4 pg — ABNORMAL LOW (ref 26.0–34.0)
MCHC: 29.3 g/dL — ABNORMAL LOW (ref 30.0–36.0)
MCV: 83.3 fL (ref 80.0–100.0)
Monocytes Absolute: 1.7 10*3/uL — ABNORMAL HIGH (ref 0.1–1.0)
Monocytes Relative: 11 %
Neutro Abs: 11.6 10*3/uL — ABNORMAL HIGH (ref 1.7–7.7)
Neutrophils Relative %: 73 %
Platelets: 628 10*3/uL — ABNORMAL HIGH (ref 150–400)
RBC: 4.5 MIL/uL (ref 3.87–5.11)
RDW: 15.4 % (ref 11.5–15.5)
WBC: 15.4 10*3/uL — ABNORMAL HIGH (ref 4.0–10.5)
nRBC: 0 % (ref 0.0–0.2)

## 2019-09-03 LAB — FERRITIN: Ferritin: 7 ng/mL — ABNORMAL LOW (ref 11–307)

## 2019-09-03 LAB — IRON AND TIBC
Iron: 49 ug/dL (ref 28–170)
Saturation Ratios: 10 % — ABNORMAL LOW (ref 10.4–31.8)
TIBC: 510 ug/dL — ABNORMAL HIGH (ref 250–450)
UIBC: 461 ug/dL

## 2019-09-03 LAB — VITAMIN D 25 HYDROXY (VIT D DEFICIENCY, FRACTURES): Vit D, 25-Hydroxy: 34.19 ng/mL (ref 30–100)

## 2019-09-03 LAB — VITAMIN B12: Vitamin B-12: 460 pg/mL (ref 180–914)

## 2019-09-03 LAB — LACTATE DEHYDROGENASE: LDH: 134 U/L (ref 98–192)

## 2019-09-08 DIAGNOSIS — M25641 Stiffness of right hand, not elsewhere classified: Secondary | ICD-10-CM | POA: Diagnosis not present

## 2019-09-08 DIAGNOSIS — M1811 Unilateral primary osteoarthritis of first carpometacarpal joint, right hand: Secondary | ICD-10-CM | POA: Diagnosis not present

## 2019-09-09 ENCOUNTER — Inpatient Hospital Stay (HOSPITAL_BASED_OUTPATIENT_CLINIC_OR_DEPARTMENT_OTHER): Payer: PPO | Admitting: Nurse Practitioner

## 2019-09-09 DIAGNOSIS — D500 Iron deficiency anemia secondary to blood loss (chronic): Secondary | ICD-10-CM | POA: Diagnosis not present

## 2019-09-09 DIAGNOSIS — D519 Vitamin B12 deficiency anemia, unspecified: Secondary | ICD-10-CM | POA: Diagnosis not present

## 2019-09-09 DIAGNOSIS — D72829 Elevated white blood cell count, unspecified: Secondary | ICD-10-CM

## 2019-09-09 DIAGNOSIS — E1165 Type 2 diabetes mellitus with hyperglycemia: Secondary | ICD-10-CM | POA: Diagnosis not present

## 2019-09-09 NOTE — Assessment & Plan Note (Signed)
1.  Iron deficiency anemia: - Likely due to from chronic GI blood loss from gastric and small bowel AVMs, patient was on aspirin and Plavix. -Plavix was discontinued in October 2019.  She does continue taking her aspirin 81 mg daily -She has a history of receiving multiple blood transfusions. -She denies any bleeding per rectum or melena.  She does complain of fatigue. - Her last Feraheme infusion was on 10/17/2018 and 10/24/2018. - She had labs on 09/03/2019 showed her hemoglobin 11.0, ferritin 7, percent saturation 10. -We will set her up with 2 iron infusions -We will see her back in 3 months with repeat labs.  2.  Leukocytosis and thrombocytosis: - Her Zbikowski count has been elevated since 2004, predominantly neutrophils.  She also has had a elevated platelet count. - Her blood work was evaluated and BCR/ABL by FISH and Jak 2 V6 17 F testing were negative ruling out myeloproliferative disorders. -Labs on 09/03/2019 showed her WBC at 15.4 and platelets at 628. -We will continue to monitor.  3.  B12 deficiency: - Labs on 09/03/2019 showed her B12 level at 460. -She has been getting monthly B12 injections. -We will continue to monitor her levels.  4.  Newly diagnosed diabetes: - Her blood sugars were found to be over 500. - Her PCP placed her on metformin 500 mg twice daily. - She reports the lowest she seen her blood sugar was 213. - She reports when her blood sugars are in the 200s she feels fatigued and dizzy. -Labs on 09/03/2019 showed her glucose was 238 -She will follow-up with her PCP in a few weeks.

## 2019-09-09 NOTE — Progress Notes (Signed)
Tammy Mcpherson, [place] [address]   CLINIC:  Medical Oncology/Hematology  PCP:  [name], NP [place] [address] [phone]   REASON FOR VISIT: Follow-up for iron deficiency anemia  CURRENT THERAPY: Intermittent iron infusions   INTERVAL HISTORY:  Tammy Mcpherson 63 y.o. adult was called for telephone visit today for iron deficiency anemia.  Patient reports she has been doing well since her last visit.  She denies any bright red bleeding per rectum or melena.  She denies any easy bruising or bleeding. Denies any nausea, vomiting, or diarrhea. Denies any new pains. Had not noticed any recent bleeding such as epistaxis, hematuria or hematochezia. Denies recent chest pain on exertion, shortness of breath on minimal exertion, pre-syncopal episodes, or palpitations. Denies any numbness or tingling in hands or feet. Denies any recent fevers, infections, or recent hospitalizations. Patient reports appetite at 100% and energy level at 50%.  She is eating well maintain her weight at this time.    REVIEW OF SYSTEMS:  Review of Systems  All other systems reviewed and are negative.    PAST MEDICAL/SURGICAL HISTORY:  Past Medical History:  Diagnosis Date  . Anemia 04/30/2016  . Anxiety   . Blood transfusion without reported diagnosis   . Cardiomyopathy ([place])    a. EF 40-45% by echo in 04/2016 b. Improved to 60-65% by repeat imaging in 2018  . CHF (congestive heart failure) ([place])   . COPD (chronic obstructive pulmonary disease) ([place])   . Coronary artery calcification seen on CT scan   . Essential hypertension   . GERD (gastroesophageal reflux disease)   . History of bronchitis   . History of GI bleed   . Hyperlipidemia   . Iron deficiency anemia 05/12/2016   Bleeding ulcers  . Pollen allergy   . PSVT (paroxysmal supraventricular tachycardia) ([place])   . PVC's (premature ventricular contractions)    Past Surgical History:  Procedure  Laterality Date  . ABDOMINAL HYSTERECTOMY     polyps  about age 57  . Barbie Banner OSTEOTOMY Right 07/10/2013   Procedure: Barbie Banner OSTEOTOMY RIGHT FOOT;  Surgeon: Marcheta Grammes, DPM;  Location: [facility];  Service: Orthopedics;  Laterality: Right;  . AMPUTATION Left 05/09/2017   Procedure: AMPUTATION 5TH TOE LEFT FOOT;  Surgeon: [name], DPM;  Location: [facility];  Service: Podiatry;  Laterality: Left;  . AMPUTATION Left 06/13/2017   Procedure: AMPUTATION 2ND TOE LEFT FOOT;  Surgeon: [name], DPM;  Location: [facility];  Service: Podiatry;  Laterality: Left;  . BUNIONECTOMY Right 07/10/2013   Procedure: VOGLER BUNIONECTOMY RIGHT FOOT;  Surgeon: Marcheta Grammes, DPM;  Location: [facility];  Service: Orthopedics;  Laterality: Right;  . CHOLECYSTECTOMY N/A 08/22/2017   Procedure: LAPAROSCOPIC CHOLECYSTECTOMY;  Surgeon: Virl Cagey, MD;  Location: [facility];  Service: General;  Laterality: N/A;  . COLONOSCOPY N/A 07/07/2016   Dr. Oneida Alar; redundant left colon, diverticulosis at hepatic flexure, non-bleeding internal hemorrhoids  . ESOPHAGOGASTRODUODENOSCOPY N/A 07/07/2016   Dr. Oneida Alar: many non-bleeding cratered gastric ulcers without stigmata of bleeding in gastric antrum. four 2-3 mm angioectasias without bleeding in duodenal bulb and second portion of duodenum s/p APC. Chroni gastritis on path.   . ESOPHAGOGASTRODUODENOSCOPY N/A 08/03/2017   Dr. Oneida Alar: erosive gastritis, AVMs. Found a single non-bleeding angioectasia in stomach, s/p APC therapy. Four non-bleeding angioectasias in duodenum s/p APC. Non-bleeding erosive gastropathy  . IR ANGIOGRAM EXTREMITY LEFT  04/24/2017  . IR FEM POP ART ATHERECT  INC PTA MOD SED  04/24/2017  . IR INFUSION THROMBOL ARTERIAL INITIAL (MS)  04/24/2017  . IR RADIOLOGIST EVAL & MGMT  12/05/2016  . IR US GUIDE VASC ACCESS RIGHT  04/24/2017  . LIVER BIOPSY N/A 08/22/2017   Procedure: LIVER BIOPSY;  Surgeon: Virl Cagey, MD;  Location: [facility];  Service:  General;  Laterality: N/A;  . METATARSAL HEAD EXCISION Right 07/10/2013   Procedure: METATARSAL HEAD RESECTION OF DIGITS 2 AND 3 RIGHT FOOT;  Surgeon: Marcheta Grammes, DPM;  Location: [facility];  Service: Orthopedics;  Laterality: Right;  . PROXIMAL INTERPHALANGEAL FUSION (PIP) Right 07/10/2013   Procedure: ARTHRODESIS PIPJ  2ND DIGIT RIGHT FOOT;  Surgeon: Marcheta Grammes, DPM;  Location: [facility];  Service: Orthopedics;  Laterality: Right;     SOCIAL HISTORY:  Social History   Socioeconomic History  . Marital status: Married    Spouse name: Tammy Mcpherson  . Number of children: 1  . Years of education: 84  . Highest education level: Not on file  Occupational History  . Occupation: disabled    Comment: heart  Social Needs  . Financial resource strain: Not on file  . Food insecurity    Worry: Not on file    Inability: Not on file  . Transportation needs    Medical: Not on file    Non-medical: Not on file  Tobacco Use  . Smoking status: Current Every Day Smoker    Packs/day: 1.00    Years: 44.00    Pack years: 44.00    Types: Cigarettes    Start date: 05/10/1975  . Smokeless tobacco: Never Used  . Tobacco comment: pack per day 1.15.20  Substance and Sexual Activity  . Alcohol use: Yes    Alcohol/week: 2.0 standard drinks    Types: 2 Glasses of wine per week  . Drug use: No  . Sexual activity: Yes    Birth control/protection: Surgical  Lifestyle  . Physical activity    Days per week: Not on file    Minutes per session: Not on file  . Stress: Not on file  Relationships  . Social Herbalist on phone: Not on file    Gets together: Not on file    Attends religious service: Not on file    Active member of club or organization: Not on file    Attends meetings of clubs or organizations: Not on file    Relationship status: Not on file  . Intimate partner violence    Fear of current or ex partner: Not on file    Emotionally abused: Not on file    Physically  abused: Not on file    Forced sexual activity: Not on file  Other Topics Concern  . Not on file  Social History Narrative   Disabled   Lives with husband Tammy Mcpherson   Two dogs    FAMILY HISTORY:  Family History  Adopted: Yes  Problem Relation Age of Onset  . Hypertension Father   . Coronary artery disease Sister   . Ulcers Maternal Grandmother   . Colon cancer Neg Hx        unknown, was adopted    CURRENT MEDICATIONS:  Outpatient Encounter Medications as of 09/09/2019  Medication Sig Note  . acetaminophen (TYLENOL) 500 MG tablet Take 500 mg by mouth at bedtime as needed for moderate pain.   Marland Kitchen albuterol (PROVENTIL) (2.5 MG/3ML) 0.083% nebulizer solution Take 3 mLs (2.5 mg total) by nebulization every 4 (  four) hours as needed for wheezing or shortness of breath.   Marland Kitchen aspirin 81 MG chewable tablet Chew 81 mg by mouth every morning.    Marland Kitchen atorvastatin (LIPITOR) 20 MG tablet TAKE ONE TABLET (20MG TOTAL) BY MOUTH DAILY (Patient taking differently: Take 20 mg by mouth every morning. )   . bisoprolol (ZEBETA) 10 MG tablet Take 1 tablet (10 mg total) by mouth daily.   . Blood Glucose Monitoring Suppl (ONE TOUCH ULTRA 2) w/Device KIT    . Continuous Blood Gluc Receiver (FREESTYLE LIBRE 14 DAY READER) DEVI See admin instructions.   . Continuous Blood Gluc Sensor (FREESTYLE LIBRE SENSOR SYSTEM) MISC Use as directed   . cyanocobalamin (,VITAMIN B-12,) 1000 MCG/ML injection Inject 1 mL (1,000 mcg total) into the muscle every 30 (thirty) days. 01/21/2019: 01/09/2019  . Fluticasone-Umeclidin-Vilant (TRELEGY ELLIPTA) 100-62.5-25 MCG/INH AEPB Inhale 1 puff into the lungs daily.   Marland Kitchen gabapentin (NEURONTIN) 300 MG capsule Take 1 capsule (300 mg total) by mouth at bedtime.   Marland Kitchen glucose blood (ONETOUCH ULTRA) test strip Use as instructed   . glucose monitoring kit (FREESTYLE) monitoring kit Use to check blood sugar BID as directed   . Lancets (ONETOUCH DELICA PLUS [id number]) MISC 1 each by Does not apply route 2  (two) times a day.   . losartan (COZAAR) 100 MG tablet TAKE ONE (1) TABLET BY MOUTH EVERY DAY   . metFORMIN (GLUCOPHAGE) 1000 MG tablet Take 1 tablet (1,000 mg total) by mouth 2 (two) times daily with a meal.   . Misc. Devices MISC Please provide supplies (needle, syringe, alcohol swabs) needed for patient to self-administer B-12 injections monthly.   . nicotine polacrilex (COMMIT) 4 MG lozenge Take 1 lozenge (4 mg total) by mouth as needed for smoking cessation.   Marland Kitchen oxyCODONE (OXY IR/ROXICODONE) 5 MG immediate release tablet Take 0-1 tablet every 6 hours as needed only for severe postop pain   . pantoprazole (PROTONIX) 40 MG tablet TAKE ONE (1) TABLET BY MOUTH EVERY DAY (Patient taking differently: Take 40 mg by mouth every morning. TAKE ONE (1) TABLET BY MOUTH EVERY DAY)   . traMADol (ULTRAM) 50 MG tablet Take 50 mg by mouth every 6 (six) hours as needed.    No facility-administered encounter medications on file as of 09/09/2019.     ALLERGIES:  Allergies  Allergen Reactions  . Bee Venom Swelling  . Codeine Anaphylaxis    Tongue swelled  . Penicillins Swelling and Rash    Has patient had a PCN reaction causing immediate rash, facial/tongue/throat swelling, SOB or lightheadedness with hypotension: No, delayed Has patient had a PCN reaction causing severe rash involving mucus membranes or skin necrosis: No Has patient had a PCN reaction that required hospitalization No Has patient had a PCN reaction occurring within the last 10 years: No If all of the above answers are "NO", then may proceed with Cephalosporin use.   . Bupropion Other (See Comments)    "Made my skin crawl"  . Sudafed [Pseudoephedrine Hcl] Rash     Vital signs: -Deferred due to telephone visit  Physical Exam -Deferred due to telephone visit -Patient was alert and oriented over the phone and in no acute distress.  LABORATORY DATA:  I have reviewed the labs as listed.  CBC    Component Value Date/Time   WBC  15.4 (H) 09/03/2019 1301   RBC 4.50 09/03/2019 1301   HGB 11.0 (L) 09/03/2019 1301   HGB 11.8 07/02/2019 1539   HCT 37.5  09/03/2019 1301   HCT 35.9 07/02/2019 1539   PLT 628 (H) 09/03/2019 1301   PLT 518 (H) 07/02/2019 1539   MCV 83.3 09/03/2019 1301   MCV 86 07/02/2019 1539   MCH 24.4 (L) 09/03/2019 1301   MCHC 29.3 (L) 09/03/2019 1301   RDW 15.4 09/03/2019 1301   RDW 13.6 07/02/2019 1539   LYMPHSABS 1.8 09/03/2019 1301   LYMPHSABS 1.8 07/02/2019 1539   MONOABS 1.7 (H) 09/03/2019 1301   EOSABS 0.2 09/03/2019 1301   EOSABS 0.1 07/02/2019 1539   BASOSABS 0.1 09/03/2019 1301   BASOSABS 0.1 07/02/2019 1539   CMP Latest Ref Rng & Units 09/03/2019 07/02/2019 06/02/2019  Glucose 70 - 99 mg/dL 238(H) 230(H) 213(H)  BUN 8 - 23 mg/dL '10 10 9  ' Creatinine 0.44 - 1.00 mg/dL 0.70 0.72 0.74  Sodium 135 - 145 mmol/L 136 139 138  Potassium 3.5 - 5.1 mmol/L 4.0 5.0 4.2  Chloride 98 - 111 mmol/L 103 104 106  CO2 22 - 32 mmol/L 22 20 21(L)  Calcium 8.9 - 10.3 mg/dL 8.8(L) 10.0 9.2  Total Protein 6.5 - 8.1 g/dL 7.1 6.5 7.1  Total Bilirubin 0.3 - 1.2 mg/dL 0.4 <0.2 0.4  Alkaline Phos 38 - 126 U/L 105 140(H) 119  AST 15 - 41 U/L '16 10 26  ' ALT 0 - 44 U/L 15 13 43    All questions were answered to patient's stated satisfaction. Encouraged patient to call with any new concerns or questions before his next visit to the cancer center and we can certain see him sooner, if needed.     ASSESSMENT & PLAN:   Anemia, iron deficiency 1.  Iron deficiency anemia: - Likely due to from chronic GI blood loss from gastric and small bowel AVMs, patient was on aspirin and Plavix. -Plavix was discontinued in October 2019.  She does continue taking her aspirin 81 mg daily -She has a history of receiving multiple blood transfusions. -She denies any bleeding per rectum or melena.  She does complain of fatigue. - Her last Feraheme infusion was on 10/17/2018 and 10/24/2018. - She had labs on 09/03/2019 showed her  hemoglobin 11.0, ferritin 7, percent saturation 10. -We will set her up with 2 iron infusions -We will see her back in 3 months with repeat labs.  2.  Leukocytosis and thrombocytosis: - Her Levario count has been elevated since 2004, predominantly neutrophils.  She also has had a elevated platelet count. - Her blood work was evaluated and BCR/ABL by FISH and Jak 2 V6 17 F testing were negative ruling out myeloproliferative disorders. -Labs on 09/03/2019 showed her WBC at 15.4 and platelets at 628. -We will continue to monitor.  3.  B12 deficiency: - Labs on 09/03/2019 showed her B12 level at 460. -She has been getting monthly B12 injections. -We will continue to monitor her levels.  4.  Newly diagnosed diabetes: - Her blood sugars were found to be over 500. - Her PCP placed her on metformin 500 mg twice daily. - She reports the lowest she seen her blood sugar was 213. - She reports when her blood sugars are in the 200s she feels fatigued and dizzy. -Labs on 09/03/2019 showed her glucose was 238 -She will follow-up with her PCP in a few weeks.      Orders placed this encounter:  Orders Placed This Encounter  Procedures  . Lactate dehydrogenase  . CBC with Differential/Platelet  . Comprehensive metabolic panel  . Ferritin  . Iron and  TIBC  . Vitamin B12  . Vitamin D 25 hydroxy  . Folate      Francene Finders, FNP-C [facility] [phone]

## 2019-09-16 DIAGNOSIS — M25641 Stiffness of right hand, not elsewhere classified: Secondary | ICD-10-CM | POA: Diagnosis not present

## 2019-09-16 DIAGNOSIS — M1811 Unilateral primary osteoarthritis of first carpometacarpal joint, right hand: Secondary | ICD-10-CM | POA: Diagnosis not present

## 2019-09-18 ENCOUNTER — Inpatient Hospital Stay (HOSPITAL_COMMUNITY): Payer: PPO

## 2019-09-18 ENCOUNTER — Encounter (HOSPITAL_COMMUNITY): Payer: Self-pay

## 2019-09-18 ENCOUNTER — Other Ambulatory Visit: Payer: Self-pay

## 2019-09-18 VITALS — BP 132/65 | HR 79 | Temp 96.9°F | Resp 18

## 2019-09-18 DIAGNOSIS — D508 Other iron deficiency anemias: Secondary | ICD-10-CM

## 2019-09-18 DIAGNOSIS — D500 Iron deficiency anemia secondary to blood loss (chronic): Secondary | ICD-10-CM | POA: Diagnosis not present

## 2019-09-18 MED ORDER — CYANOCOBALAMIN 1000 MCG/ML IJ SOLN: 1000.0000 ug | Freq: Once | INTRAMUSCULAR | Status: DC

## 2019-09-18 MED ORDER — SODIUM CHLORIDE 0.9 % IV SOLN: INTRAVENOUS | Status: DC

## 2019-09-18 MED ORDER — SODIUM CHLORIDE 0.9 % IV SOLN
510.0000 mg | Freq: Once | INTRAVENOUS | Status: AC
  Administered 2019-09-18: 510 mg via INTRAVENOUS
  Filled 2019-09-18: qty 510

## 2019-09-18 NOTE — Progress Notes (Signed)
Patient presents today for Feraheme infusion. Patient has no complaints of any changes since her last visit. Vital signs stable.   Feraheme given today per MD orders. Tolerated infusion without adverse affects. Vital signs stable. No complaints at this time. Discharged from clinic ambulatory. F/U with [facility] as scheduled.

## 2019-09-18 NOTE — Patient Instructions (Signed)
[facility] at [facility]  Discharge Instructions:   _______________________________________________________________  Thank you for choosing [facility] at [facility] to provide your oncology and hematology care.  To afford each patient quality time with our providers, please arrive at least 15 minutes before your scheduled appointment.  You need to re-schedule your appointment if you arrive 10 or more minutes late.  We strive to give you quality time with our providers, and arriving late affects you and other patients whose appointments are after yours.  Also, if you no show three or more times for appointments you may be dismissed from the clinic.  Again, thank you for choosing [facility] at [facility]. Our hope is that these requests will allow you access to exceptional care and in a timely manner. _______________________________________________________________  If you have questions after your visit, please contact our office at [phone] between the hours of 8:30 a.m. and 5:00 p.m. Voicemails left after 4:30 p.m. will not be returned until the following business day. _______________________________________________________________  For prescription refill requests, have your pharmacy contact our office. _______________________________________________________________  Recommendations made by the consultant and any test results will be sent to your referring physician. _______________________________________________________________ 

## 2019-09-22 DIAGNOSIS — M25641 Stiffness of right hand, not elsewhere classified: Secondary | ICD-10-CM | POA: Diagnosis not present

## 2019-09-22 DIAGNOSIS — M1811 Unilateral primary osteoarthritis of first carpometacarpal joint, right hand: Secondary | ICD-10-CM | POA: Diagnosis not present

## 2019-09-22 DIAGNOSIS — J449 Chronic obstructive pulmonary disease, unspecified: Secondary | ICD-10-CM | POA: Diagnosis not present

## 2019-09-25 ENCOUNTER — Ambulatory Visit (HOSPITAL_COMMUNITY): Payer: PPO

## 2019-09-29 ENCOUNTER — Inpatient Hospital Stay (HOSPITAL_COMMUNITY): Payer: PPO

## 2019-09-29 ENCOUNTER — Other Ambulatory Visit: Payer: Self-pay

## 2019-09-29 ENCOUNTER — Encounter (HOSPITAL_COMMUNITY): Payer: Self-pay

## 2019-09-29 VITALS — BP 182/75 | HR 88 | Temp 98.0°F | Resp 18

## 2019-09-29 DIAGNOSIS — D500 Iron deficiency anemia secondary to blood loss (chronic): Secondary | ICD-10-CM | POA: Diagnosis not present

## 2019-09-29 DIAGNOSIS — D508 Other iron deficiency anemias: Secondary | ICD-10-CM

## 2019-09-29 MED ORDER — SODIUM CHLORIDE 0.9 % IV SOLN
510.0000 mg | Freq: Once | INTRAVENOUS | Status: AC
  Administered 2019-09-29: 510 mg via INTRAVENOUS
  Filled 2019-09-29: qty 510

## 2019-09-29 MED ORDER — SODIUM CHLORIDE 0.9 % IV SOLN: INTRAVENOUS | Status: DC

## 2019-09-29 NOTE — Progress Notes (Signed)
Tammy Mcpherson tolerated Feraheme infusion well without complaints or incident. Peripheral IV site checked with positive blood return noted prior to and after infusion. VSS Pt discharged self ambulatory in satisfactory condition

## 2019-09-29 NOTE — Patient Instructions (Signed)
[facility] at [facility] Discharge Instructions  Received Feraheme infusion today. Follow-up as scheduled. Call clinic for any questions or concerns   Thank you for choosing [facility] at [facility] to provide your oncology and hematology care.  To afford each patient quality time with our provider, please arrive at least 15 minutes before your scheduled appointment time.   If you have a lab appointment with the Cancer Center please come in thru the Main Entrance and check in at the main information desk.  You need to re-schedule your appointment should you arrive 10 or more minutes late.  We strive to give you quality time with our providers, and arriving late affects you and other patients whose appointments are after yours.  Also, if you no show three or more times for appointments you may be dismissed from the clinic at the providers discretion.     Again, thank you for choosing [facility].  Our hope is that these requests will decrease the amount of time that you wait before being seen by our physicians.       _____________________________________________________________  Should you have questions after your visit to [facility], please contact our office at [phone] between the hours of 8:00 a.m. and 4:30 p.m.  Voicemails left after 4:00 p.m. will not be returned until the following business day.  For prescription refill requests, have your pharmacy contact our office and allow 72 hours.    Due to Covid, you will need to wear a mask upon entering the hospital. If you do not have a mask, a mask will be given to you at the Main Entrance upon arrival. For doctor visits, patients may have 1 support person with them. For treatment visits, patients can not have anyone with them due to social distancing guidelines and our immunocompromised population.     

## 2019-10-01 DIAGNOSIS — M1811 Unilateral primary osteoarthritis of first carpometacarpal joint, right hand: Secondary | ICD-10-CM | POA: Diagnosis not present

## 2019-10-01 DIAGNOSIS — M25641 Stiffness of right hand, not elsewhere classified: Secondary | ICD-10-CM | POA: Diagnosis not present

## 2019-10-08 ENCOUNTER — Other Ambulatory Visit: Payer: Self-pay | Admitting: Cardiology

## 2019-10-09 ENCOUNTER — Encounter: Payer: Self-pay | Admitting: Registered Nurse

## 2019-10-09 DIAGNOSIS — M1811 Unilateral primary osteoarthritis of first carpometacarpal joint, right hand: Secondary | ICD-10-CM | POA: Diagnosis not present

## 2019-10-09 DIAGNOSIS — M25641 Stiffness of right hand, not elsewhere classified: Secondary | ICD-10-CM | POA: Diagnosis not present

## 2019-10-13 ENCOUNTER — Other Ambulatory Visit: Payer: Self-pay

## 2019-10-13 ENCOUNTER — Ambulatory Visit (INDEPENDENT_AMBULATORY_CARE_PROVIDER_SITE_OTHER): Payer: PPO | Admitting: Registered Nurse

## 2019-10-13 ENCOUNTER — Other Ambulatory Visit: Payer: Self-pay | Admitting: Registered Nurse

## 2019-10-13 DIAGNOSIS — E162 Hypoglycemia, unspecified: Secondary | ICD-10-CM

## 2019-10-13 NOTE — Progress Notes (Signed)
Labs placed Pt has visit later this week with myself  Kathrin Ruddy, NP

## 2019-10-14 ENCOUNTER — Encounter: Payer: Self-pay | Admitting: Registered Nurse

## 2019-10-14 DIAGNOSIS — M6740 Ganglion, unspecified site: Secondary | ICD-10-CM | POA: Diagnosis not present

## 2019-10-14 DIAGNOSIS — R200 Anesthesia of skin: Secondary | ICD-10-CM | POA: Diagnosis not present

## 2019-10-14 DIAGNOSIS — M25641 Stiffness of right hand, not elsewhere classified: Secondary | ICD-10-CM | POA: Diagnosis not present

## 2019-10-14 DIAGNOSIS — M1811 Unilateral primary osteoarthritis of first carpometacarpal joint, right hand: Secondary | ICD-10-CM | POA: Diagnosis not present

## 2019-10-14 LAB — COMPREHENSIVE METABOLIC PANEL
ALT: 19 IU/L (ref 0–32)
AST: 14 IU/L (ref 0–40)
Albumin/Globulin Ratio: 1.4 (ref 1.2–2.2)
Albumin: 4.1 g/dL (ref 3.8–4.8)
Alkaline Phosphatase: 124 IU/L — ABNORMAL HIGH (ref 39–117)
BUN/Creatinine Ratio: 13 (ref 12–28)
BUN: 8 mg/dL (ref 8–27)
Bilirubin Total: 0.3 mg/dL (ref 0.0–1.2)
CO2: 21 mmol/L (ref 20–29)
Calcium: 10.2 mg/dL (ref 8.7–10.3)
Chloride: 104 mmol/L (ref 96–106)
Creatinine, Ser: 0.6 mg/dL (ref 0.57–1.00)
GFR calc Af Amer: 113 mL/min/{1.73_m2} (ref >59)
GFR calc non Af Amer: 98 mL/min/{1.73_m2} (ref >59)
Globulin, Total: 2.9 g/dL (ref 1.5–4.5)
Glucose: 201 mg/dL — ABNORMAL HIGH (ref 65–99)
Potassium: 4.5 mmol/L (ref 3.5–5.2)
Sodium: 139 mmol/L (ref 134–144)
Total Protein: 7 g/dL (ref 6.0–8.5)

## 2019-10-14 LAB — HEMOGLOBIN A1C
Est. average glucose Bld gHb Est-mCnc: 154 mg/dL
Hgb A1c MFr Bld: 7 % — ABNORMAL HIGH (ref 4.8–5.6)

## 2019-10-14 LAB — LIPASE: Lipase: 51 U/L (ref 14–72)

## 2019-10-14 LAB — AMYLASE: Amylase: 57 U/L (ref 31–110)

## 2019-10-15 ENCOUNTER — Encounter: Payer: Self-pay | Admitting: Registered Nurse

## 2019-10-15 ENCOUNTER — Ambulatory Visit (INDEPENDENT_AMBULATORY_CARE_PROVIDER_SITE_OTHER): Payer: PPO | Admitting: Registered Nurse

## 2019-10-15 ENCOUNTER — Other Ambulatory Visit: Payer: Self-pay

## 2019-10-15 VITALS — BP 138/76 | HR 75 | Temp 97.8°F | Ht 64.0 in | Wt 140.0 lb

## 2019-10-15 DIAGNOSIS — E1142 Type 2 diabetes mellitus with diabetic polyneuropathy: Secondary | ICD-10-CM

## 2019-10-15 NOTE — Patient Instructions (Signed)
° ° ° °  If you have lab work done today you will be contacted with your lab results within the next 2 weeks.  If you have not heard from us then please contact us. The fastest way to get your results is to register for My Chart. ° ° °IF you received an x-ray today, you will receive an invoice from [facility]. Please contact [facility] at [phone] with questions or concerns regarding your invoice.  ° °IF you received labwork today, you will receive an invoice from [facility]. Please contact [facility] at [phone] with questions or concerns regarding your invoice.  ° °Our billing staff will not be able to assist you with questions regarding bills from these companies. ° °You will be contacted with the lab results as soon as they are available. The fastest way to get your results is to activate your My Chart account. Instructions are located on the last page of this paperwork. If you have not heard from us regarding the results in 2 weeks, please contact this office. °  ° ° ° °

## 2019-10-16 ENCOUNTER — Telehealth: Payer: Self-pay | Admitting: Registered Nurse

## 2019-10-16 ENCOUNTER — Telehealth: Payer: Self-pay | Admitting: *Deleted

## 2019-10-16 NOTE — Telephone Encounter (Signed)
Schedule awv  

## 2019-10-16 NOTE — Telephone Encounter (Signed)
Pt called back to schedule awv. Pt said best time to reach her is after 10 AM, any day

## 2019-10-17 NOTE — Progress Notes (Signed)
Established Patient Office Visit  Subjective:  [name], adult    DOB: 12/24/1955  Age: 64 y.o. MRN: [id number]  CC:  Chief Complaint  Patient presents with  . Diabetes    patient been having issues with blood sugar    HPI Tammy Mcpherson presents for concern for BP.  Been waking up hypoglycemic in 50s - once as low as 49. Typical hypoglycemic symptoms as well, but able to eat and bring sugars up. Highest have been mid 100s. Notes that she has already cut her metformin in half - only taking 572m PO bid. Has stopped for three days with concern for hypoglycemia.  Otherwise no complaints   Past Medical History:  Diagnosis Date  . Anemia 04/30/2016  . Anxiety   . Blood transfusion without reported diagnosis   . Cardiomy[place] ([place]    a. EF 40-45% by echo in 04/2016 b. Improved to 60-65% by repeat imaging in 2018  . CHF (congestive heart failure) ([facility]   . COPD (chronic obstructive pulmonary disease) ([facility]   . Coronary artery calcification seen on CT scan   . Essential hypertension   . GERD (gastroesophageal reflux disease)   . History of bronchitis   . History of GI bleed   . Hyperlipidemia   . Iron deficiency anemia 05/12/2016   Bleeding ulcers  . Pollen allergy   . PSVT (paroxysmal supraventricular tachycardia) ([facility]   . PVC's (premature ventricular contractions)     Past Surgical History:  Procedure Laterality Date  . ABDOMINAL HYSTERECTOMY     polyps  about age 64 . ABarbie BannerOSTEOTOMY Right 07/10/2013   Procedure: ABarbie BannerOSTEOTOMY RIGHT FOOT;  Surgeon: BMarcheta Grammes DPM;  Location: [facility];  Service: Orthopedics;  Laterality: Right;  . AMPUTATION Left 05/09/2017   Procedure: AMPUTATION 5TH TOE LEFT FOOT;  Surgeon: MCaprice Beaver DPM;  Location: [facility];  Service: Podiatry;  Laterality: Left;  . AMPUTATION Left 06/13/2017   Procedure: AMPUTATION 2ND TOE LEFT FOOT;  Surgeon: MCaprice Beaver DPM;  Location: [facility];  Service: Podiatry;   Laterality: Left;  . BUNIONECTOMY Right 07/10/2013   Procedure: VOGLER BUNIONECTOMY RIGHT FOOT;  Surgeon: BMarcheta Grammes DPM;  Location: [facility];  Service: Orthopedics;  Laterality: Right;  . CHOLECYSTECTOMY N/A 08/22/2017   Procedure: LAPAROSCOPIC CHOLECYSTECTOMY;  Surgeon: BVirl Cagey MD;  Location: [facility];  Service: General;  Laterality: N/A;  . COLONOSCOPY N/A 07/07/2016   Dr. FOneida Alar redundant left colon, diverticulosis at hepatic flexure, non-bleeding internal hemorrhoids  . ESOPHAGOGASTRODUODENOSCOPY N/A 07/07/2016   Dr. FOneida Alar many non-bleeding cratered gastric ulcers without stigmata of bleeding in gastric antrum. four 2-3 mm angioectasias without bleeding in duodenal bulb and second portion of duodenum s/p APC. Chroni gastritis on path.   . ESOPHAGOGASTRODUODENOSCOPY N/A 08/03/2017   Dr. FOneida Alar erosive gastritis, AVMs. Found a single non-bleeding angioectasia in stomach, s/p APC therapy. Four non-bleeding angioectasias in duodenum s/p APC. Non-bleeding erosive gastropathy  . IR ANGIOGRAM EXTREMITY LEFT  04/24/2017  . IR FEM POP ART ATHERECT INC PTA MOD SED  04/24/2017  . IR INFUSION THROMBOL ARTERIAL INITIAL (MS)  04/24/2017  . IR RADIOLOGIST EVAL & MGMT  12/05/2016  . IR [place] VASC ACCESS RIGHT  04/24/2017  . LIVER BIOPSY N/A 08/22/2017   Procedure: LIVER BIOPSY;  Surgeon: BVirl Cagey MD;  Location: [facility];  Service: General;  Laterality: N/A;  . METATARSAL HEAD EXCISION Right 07/10/2013   Procedure: METATARSAL HEAD RESECTION OF DIGITS  2 AND 3 RIGHT FOOT;  Surgeon: Marcheta Grammes, DPM;  Location: [facility];  Service: Orthopedics;  Laterality: Right;  . PROXIMAL INTERPHALANGEAL FUSION (PIP) Right 07/10/2013   Procedure: ARTHRODESIS PIPJ  2ND DIGIT RIGHT FOOT;  Surgeon: Marcheta Grammes, DPM;  Location: [facility];  Service: Orthopedics;  Laterality: Right;    Family History  Adopted: Yes  Problem Relation Age of Onset  . Hypertension Father   . Coronary  artery disease Sister   . Ulcers Maternal Grandmother   . Colon cancer Neg Hx        unknown, was adopted    Social History   Socioeconomic History  . Marital status: Married    Spouse name: [name]  . Number of children: 1  . Years of education: 64  . Highest education level: Not on file  Occupational History  . Occupation: disabled    Comment: heart  Tobacco Use  . Smoking status: Current Every Day Smoker    Packs/day: 1.00    Years: 44.00    Pack years: 44.00    Types: Cigarettes    Start date: 05/10/1975  . Smokeless tobacco: Never Used  . Tobacco comment: pack per day 1.15.20  Substance and Sexual Activity  . Alcohol use: Yes    Alcohol/week: 2.0 standard drinks    Types: 2 Glasses of wine per week  . Drug use: No  . Sexual activity: Yes    Birth control/protection: Surgical  Other Topics Concern  . Not on file  Social History Narrative   Disabled   Lives with husband [name]   Two dogs   Social Determinants of Health   Financial Resource Strain:   . Difficulty of Paying Living Expenses: Not on file  Food Insecurity:   . Worried About Charity fundraiser in the Last Year: Not on file  . Ran Out of Food in the Last Year: Not on file  Transportation Needs:   . Lack of Transportation (Medical): Not on file  . Lack of Transportation (Non-Medical): Not on file  Physical Activity:   . Days of Exercise per Week: Not on file  . Minutes of Exercise per Session: Not on file  Stress:   . Feeling of Stress : Not on file  Social Connections:   . Frequency of Communication with Friends and Family: Not on file  . Frequency of Social Gatherings with Friends and Family: Not on file  . Attends Religious Services: Not on file  . Active Member of Clubs or Organizations: Not on file  . Attends Archivist Meetings: Not on file  . Marital Status: Not on file  Intimate Partner Violence:   . Fear of Current or Ex-Partner: Not on file  . Emotionally Abused: Not on file   . Physically Abused: Not on file  . Sexually Abused: Not on file    Outpatient Medications Prior to Visit  Medication Sig Dispense Refill  . acetaminophen (TYLENOL) 500 MG tablet Take 500 mg by mouth at bedtime as needed for moderate pain.    Marland Kitchen albuterol (PROVENTIL) (2.5 MG/3ML) 0.083% nebulizer solution Take 3 mLs (2.5 mg total) by nebulization every 4 (four) hours as needed for wheezing or shortness of breath. 75 mL 5  . aspirin 81 MG chewable tablet Chew 81 mg by mouth every morning.     Marland Kitchen atorvastatin (LIPITOR) 20 MG tablet TAKE ONE TABLET (20MG TOTAL) BY MOUTH DAILY (Patient taking differently: Take 20 mg by mouth every morning. )  90 tablet 3  . bisoprolol (ZEBETA) 10 MG tablet Take 1 tablet (10 mg total) by mouth daily. 90 tablet 3  . Blood Glucose Monitoring Suppl (ONE TOUCH ULTRA 2) w/Device KIT     . Continuous Blood Gluc Receiver (FREESTYLE LIBRE 14 DAY READER) DEVI See admin instructions.    . Continuous Blood Gluc Sensor (FREESTYLE LIBRE SENSOR SYSTEM) MISC Use as directed 1 each 0  . cyanocobalamin (,VITAMIN B-12,) 1000 MCG/ML injection Inject 1 mL (1,000 mcg total) into the muscle every 30 (thirty) days. 1 mL 11  . Fluticasone-Umeclidin-Vilant (TRELEGY ELLIPTA) 100-62.5-25 MCG/INH AEPB Inhale 1 puff into the lungs daily. 60 each 6  . gabapentin (NEURONTIN) 300 MG capsule Take 1 capsule (300 mg total) by mouth at bedtime. 90 capsule 3  . glucose blood (ONETOUCH ULTRA) test strip Use as instructed 100 each 12  . glucose monitoring kit (FREESTYLE) monitoring kit Use to check blood sugar BID as directed 1 each 0  . Lancets (ONETOUCH DELICA PLUS [id number]) MISC 1 each by Does not apply route 2 (two) times a day. 100 each 3  . losartan (COZAAR) 100 MG tablet TAKE ONE (1) TABLET BY MOUTH EVERY DAY 90 tablet 3  . metFORMIN (GLUCOPHAGE) 1000 MG tablet Take 1 tablet (1,000 mg total) by mouth 2 (two) times daily with a meal. 180 tablet 3  . Misc. Devices MISC Please provide supplies  (needle, syringe, alcohol swabs) needed for patient to self-administer B-12 injections monthly. 1 each 12  . nicotine polacrilex (COMMIT) 4 MG lozenge Take 1 lozenge (4 mg total) by mouth as needed for smoking cessation. 100 tablet 3  . oxyCODONE (OXY IR/ROXICODONE) 5 MG immediate release tablet Take 0-1 tablet every 6 hours as needed only for severe postop pain    . pantoprazole (PROTONIX) 40 MG tablet TAKE ONE (1) TABLET BY MOUTH EVERY DAY (Patient taking differently: Take 40 mg by mouth every morning. TAKE ONE (1) TABLET BY MOUTH EVERY DAY) 90 tablet 3  . traMADol (ULTRAM) 50 MG tablet Take 50 mg by mouth every 6 (six) hours as needed.     No facility-administered medications prior to visit.    Allergies  Allergen Reactions  . Bee Venom Swelling  . Codeine Anaphylaxis    Tongue swelled  . Penicillins Swelling and Rash    Has patient had a PCN reaction causing immediate rash, facial/tongue/throat swelling, SOB or lightheadedness with hypotension: No, delayed Has patient had a PCN reaction causing severe rash involving mucus membranes or skin necrosis: No Has patient had a PCN reaction that required hospitalization No Has patient had a PCN reaction occurring within the last 10 years: No If all of the above answers are "NO", then may proceed with Cephalosporin use.   . Bupropion Other (See Comments)    "Made my skin crawl"  . Sudafed [Pseudoephedrine Hcl] Rash    ROS Review of Systems  Constitutional: Negative.   HENT: Negative.   Eyes: Negative.   Respiratory: Negative.   Cardiovascular: Negative.   Gastrointestinal: Negative.   Endocrine: Negative.   Genitourinary: Negative.   Musculoskeletal: Negative.   Skin: Negative.   Allergic/Immunologic: Negative.   Neurological: Negative.   Hematological: Negative.   Psychiatric/Behavioral: Negative.   All other systems reviewed and are negative.     Objective:    Physical Exam  Constitutional: She is oriented to person,  place, and time. She appears well-developed and well-nourished. No distress.  Cardiovascular: Normal rate and regular rhythm.  Pulmonary/Chest: Effort normal.  No respiratory distress.  Neurological: She is alert and oriented to person, place, and time.  Skin: Skin is warm and dry. No rash noted. She is not diaphoretic. No erythema. No pallor.  Psychiatric: She has a normal mood and affect. Her behavior is normal. Judgment and thought content normal.  Nursing note and vitals reviewed.   BP 138/76 (BP Location: Right Arm, Patient Position: Sitting, Cuff Size: Normal)   Pulse 75   Temp 97.8 F (36.6 C) (Oral)   Ht _0  (1.626 m)   Wt 140 lb (63.5 kg)   SpO2 95%   BMI 24.03 kg/m  Wt Readings from Last 3 Encounters:  10/15/19 140 lb (63.5 kg)  08/19/19 155 lb (70.3 kg)  08/13/19 154 lb (69.9 kg)     Health Maintenance Due  Topic Date Due  . FOOT EXAM  09/01/1967  . OPHTHALMOLOGY EXAM  09/01/1967  . HIV Screening  08/31/1972  . TETANUS/TDAP  08/31/1976  . PAP SMEAR-Modifier  08/31/1978  . MAMMOGRAM  09/01/2007    There are no preventive care reminders to display for this patient.  Lab Results  Component Value Date   TSH 1.727 04/30/2016   Lab Results  Component Value Date   WBC 15.4 (H) 09/03/2019   HGB 11.0 (L) 09/03/2019   HCT 37.5 09/03/2019   MCV 83.3 09/03/2019   PLT 628 (H) 09/03/2019   Lab Results  Component Value Date   NA 139 10/13/2019   K 4.5 10/13/2019   CO2 21 10/13/2019   GLUCOSE 201 (H) 10/13/2019   BUN 8 10/13/2019   CREATININE 0.60 10/13/2019   BILITOT 0.3 10/13/2019   ALKPHOS 124 (H) 10/13/2019   AST 14 10/13/2019   ALT 19 10/13/2019   PROT 7.0 10/13/2019   ALBUMIN 4.1 10/13/2019   CALCIUM 10.2 10/13/2019   ANIONGAP 11 09/03/2019   Lab Results  Component Value Date   CHOL 165 07/02/2019   Lab Results  Component Value Date   HDL 32 (L) 07/02/2019   Lab Results  Component Value Date   LDLCALC 98 07/02/2019   Lab Results   Component Value Date   TRIG 200 (H) 07/02/2019   Lab Results  Component Value Date   CHOLHDL 5.2 (H) 07/02/2019   Lab Results  Component Value Date   HGBA1C 7.0 (H) 10/13/2019      Assessment & Plan:   Problem List Items Addressed This Visit    None    Visit Diagnoses    Controlled type 2 diabetes mellitus with diabetic polyneuropathy, without long-term current use of insulin ([place])    -  Primary      No orders of the defined types were placed in this encounter.   Follow-up: No follow-ups on file.   PLAN  A1c has dropped from 8.7 to 7.0 over one month - she will need to stop metformin entirely,  Continue to manage with diet and lifestyle modifications  Return in 3 mo for A1c check  Patient encouraged to call clinic with any questions, comments, or concerns.  [name], NP

## 2019-10-21 DIAGNOSIS — M25641 Stiffness of right hand, not elsewhere classified: Secondary | ICD-10-CM | POA: Diagnosis not present

## 2019-10-21 DIAGNOSIS — M1811 Unilateral primary osteoarthritis of first carpometacarpal joint, right hand: Secondary | ICD-10-CM | POA: Diagnosis not present

## 2019-10-23 DIAGNOSIS — J449 Chronic obstructive pulmonary disease, unspecified: Secondary | ICD-10-CM | POA: Diagnosis not present

## 2019-10-27 ENCOUNTER — Ambulatory Visit: Payer: PPO | Admitting: Registered Nurse

## 2019-10-27 VITALS — BP 138/76 | Ht 64.0 in | Wt 140.0 lb

## 2019-10-27 DIAGNOSIS — Z0000 Encounter for general adult medical examination without abnormal findings: Secondary | ICD-10-CM

## 2019-10-27 NOTE — Progress Notes (Signed)
Presents today for [employer] Visit   Date of last exam: 10/15/2019   Interpreter used for this visit?  No  I connected with  Tammy Mcpherson on 10/27/19 by a telepehone and verified that I am speaking with the correct person using two identifiers.   I discussed the limitations of evaluation and management by telemedicine. The patient expressed understanding and agreed to proceed.   Patient Care Team: [name], NP as PCP - General (Adult Health Nurse Practitioner) Satira Sark, MD as PCP - Cardiology (Cardiology) Danie Binder, MD as Consulting Physician (Gastroenterology) Satira Sark, MD as Consulting Physician (Cardiology) Lauraine Rinne, NP as Nurse Practitioner (Pulmonary Disease)   Other items to address today:   Discussed immunizations Discussed Eye/Dental Follow up scheduled 11/19/2019    Other Screening: Last screening for diabetes: 08/12/2020 Last lipid screening: 06-05-2019  ADVANCE DIRECTIVES: Discussed: yes On File: no Materials Provided: yes   Immunization status:  Immunization History  Administered Date(s) Administered  . Influenza,inj,Quad PF,6+ Mos 07/04/2017, 07/10/2018, 07/02/2019  . Pneumococcal Polysaccharide-23 11/01/2018     Health Maintenance Due  Topic Date Due  . FOOT EXAM  09/01/1967  . OPHTHALMOLOGY EXAM  09/01/1967  . HIV Screening  08/31/1972  . TETANUS/TDAP  08/31/1976  . PAP SMEAR-Modifier  08/31/1978  . MAMMOGRAM  09/01/2007     Functional Status Survey: Is the patient deaf or have difficulty hearing?: No Does the patient have difficulty seeing, even when wearing glasses/contacts?: No Does the patient have difficulty concentrating, remembering, or making decisions?: No Does the patient have difficulty walking or climbing stairs?: No Does the patient have difficulty dressing or bathing?: No Does the patient have difficulty doing errands alone such as visiting a doctor's office or  shopping?: No   6CIT Screen 10/27/2019  What Year? 0 points  What month? 0 points  What time? 0 points  Count back from 20 0 points  Months in reverse 0 points  Repeat phrase 0 points  Total Score 0        Clinical Support from 10/27/2019 in [facility] at [facility]  AUDIT-C Score  0       Home Environment:   One story home No grab bars in bathroom No scattered rugs No trouble climbing stairs Adequate lighting/ no clutter   Patient Active Problem List   Diagnosis Date Noted  . Ganglion cyst of volar aspect of right wrist 05/23/2019  . Neuropathy due to type 2 diabetes mellitus ([place]) 04/22/2019  . Need for hepatitis C screening test 11/20/2018  . Prediabetes 03/01/2018  . Diarrhea 02/07/2018  . Lesion of liver   . Gastric AVM   . AVM (arteriovenous malformation) of small bowel, acquired   . Vitamin D deficiency 01/15/2017  . PAD (peripheral artery disease) ([place]) 01/15/2017  . Aortic atherosclerosis ([place]) 01/01/2017  . Gastric ulcer 01/01/2017  . Hyperlipidemia 01/01/2017  . Dyspnea on effort 01/01/2017  . Anemia, iron deficiency 05/12/2016  . Nonischemic cardiomyopathy ([place]) 05/02/2016  . CAD-Ca++ coronaries on CTA 05/02/2016  . Acute combined systolic and diastolic heart failure ([place]) 04/30/2016  . Chronic obstructive pulmonary disease ([place]) 04/30/2016  . Essential hypertension 04/30/2016  . Tobacco abuse 04/30/2016     Past Medical History:  Diagnosis Date  . Anemia 04/30/2016  . Anxiety   . Blood transfusion without reported diagnosis   . Cardiomyopathy ([place])    a. EF 40-45% by echo in 04/2016 b. Improved to 60-65% by repeat  imaging in 2018  . CHF (congestive heart failure) ([place])   . COPD (chronic obstructive pulmonary disease) ([place])   . Coronary artery calcification seen on CT scan   . Essential hypertension   . GERD (gastroesophageal reflux disease)   . History of bronchitis   . History of GI bleed   . Hyperlipidemia   . Iron deficiency anemia  05/12/2016   Bleeding ulcers  . Pollen allergy   . PSVT (paroxysmal supraventricular tachycardia) ([place])   . PVC's (premature ventricular contractions)      Past Surgical History:  Procedure Laterality Date  . ABDOMINAL HYSTERECTOMY     polyps  about age 40  . Barbie Banner OSTEOTOMY Right 07/10/2013   Procedure: Barbie Banner OSTEOTOMY RIGHT FOOT;  Surgeon: Marcheta Grammes, DPM;  Location: [facility];  Service: Orthopedics;  Laterality: Right;  . AMPUTATION Left 05/09/2017   Procedure: AMPUTATION 5TH TOE LEFT FOOT;  Surgeon: [name], DPM;  Location: [facility];  Service: Podiatry;  Laterality: Left;  . AMPUTATION Left 06/13/2017   Procedure: AMPUTATION 2ND TOE LEFT FOOT;  Surgeon: [name], DPM;  Location: [facility];  Service: Podiatry;  Laterality: Left;  . BUNIONECTOMY Right 07/10/2013   Procedure: VOGLER BUNIONECTOMY RIGHT FOOT;  Surgeon: Marcheta Grammes, DPM;  Location: [facility];  Service: Orthopedics;  Laterality: Right;  . CHOLECYSTECTOMY N/A 08/22/2017   Procedure: LAPAROSCOPIC CHOLECYSTECTOMY;  Surgeon: Virl Cagey, MD;  Location: [facility];  Service: General;  Laterality: N/A;  . COLONOSCOPY N/A 07/07/2016   Dr. Oneida Alar; redundant left colon, diverticulosis at hepatic flexure, non-bleeding internal hemorrhoids  . ESOPHAGOGASTRODUODENOSCOPY N/A 07/07/2016   Dr. Oneida Alar: many non-bleeding cratered gastric ulcers without stigmata of bleeding in gastric antrum. four 2-3 mm angioectasias without bleeding in duodenal bulb and second portion of duodenum s/p APC. Chroni gastritis on path.   . ESOPHAGOGASTRODUODENOSCOPY N/A 08/03/2017   Dr. Oneida Alar: erosive gastritis, AVMs. Found a single non-bleeding angioectasia in stomach, s/p APC therapy. Four non-bleeding angioectasias in duodenum s/p APC. Non-bleeding erosive gastropathy  . IR ANGIOGRAM EXTREMITY LEFT  04/24/2017  . IR FEM POP ART ATHERECT INC PTA MOD SED  04/24/2017  . IR INFUSION THROMBOL ARTERIAL INITIAL (MS)  04/24/2017  . IR  RADIOLOGIST EVAL & MGMT  12/05/2016  . IR US GUIDE VASC ACCESS RIGHT  04/24/2017  . LIVER BIOPSY N/A 08/22/2017   Procedure: LIVER BIOPSY;  Surgeon: Virl Cagey, MD;  Location: [facility];  Service: General;  Laterality: N/A;  . METATARSAL HEAD EXCISION Right 07/10/2013   Procedure: METATARSAL HEAD RESECTION OF DIGITS 2 AND 3 RIGHT FOOT;  Surgeon: Marcheta Grammes, DPM;  Location: [facility];  Service: Orthopedics;  Laterality: Right;  . PROXIMAL INTERPHALANGEAL FUSION (PIP) Right 07/10/2013   Procedure: ARTHRODESIS PIPJ  2ND DIGIT RIGHT FOOT;  Surgeon: Marcheta Grammes, DPM;  Location: [facility];  Service: Orthopedics;  Laterality: Right;     Family History  Adopted: Yes  Problem Relation Age of Onset  . Hypertension Father   . Coronary artery disease Sister   . Ulcers Maternal Grandmother   . Colon cancer Neg Hx        unknown, was adopted     Social History   Socioeconomic History  . Marital status: Married    Spouse name: [name]  . Number of children: 1  . Years of education: 28  . Highest education level: Not on file  Occupational History  . Occupation: disabled    Comment: heart  Tobacco Use  .  Smoking status: Current Every Day Smoker    Packs/day: 1.00    Years: 44.00    Pack years: 44.00    Types: Cigarettes    Start date: 05/10/1975  . Smokeless tobacco: Never Used  . Tobacco comment: pack per day 1.15.20  Substance and Sexual Activity  . Alcohol use: Yes    Alcohol/week: 2.0 standard drinks    Types: 2 Glasses of wine per week  . Drug use: No  . Sexual activity: Yes    Birth control/protection: Surgical  Other Topics Concern  . Not on file  Social History Narrative   Disabled   Lives with husband [name]   Two dogs   Social Determinants of Health   Financial Resource Strain:   . Difficulty of Paying Living Expenses: Not on file  Food Insecurity:   . Worried About Charity fundraiser in the Last Year: Not on file  . Ran Out of Food in the Last Year:  Not on file  Transportation Needs:   . Lack of Transportation (Medical): Not on file  . Lack of Transportation (Non-Medical): Not on file  Physical Activity:   . Days of Exercise per Week: Not on file  . Minutes of Exercise per Session: Not on file  Stress:   . Feeling of Stress : Not on file  Social Connections:   . Frequency of Communication with Friends and Family: Not on file  . Frequency of Social Gatherings with Friends and Family: Not on file  . Attends Religious Services: Not on file  . Active Member of Clubs or Organizations: Not on file  . Attends Archivist Meetings: Not on file  . Marital Status: Not on file  Intimate Partner Violence:   . Fear of Current or Ex-Partner: Not on file  . Emotionally Abused: Not on file  . Physically Abused: Not on file  . Sexually Abused: Not on file     Allergies  Allergen Reactions  . Bee Venom Swelling  . Codeine Anaphylaxis    Tongue swelled  . Penicillins Swelling and Rash    Has patient had a PCN reaction causing immediate rash, facial/tongue/throat swelling, SOB or lightheadedness with hypotension: No, delayed Has patient had a PCN reaction causing severe rash involving mucus membranes or skin necrosis: No Has patient had a PCN reaction that required hospitalization No Has patient had a PCN reaction occurring within the last 10 years: No If all of the above answers are "NO", then may proceed with Cephalosporin use.   . Bupropion Other (See Comments)    "Made my skin crawl"  . Sudafed [Pseudoephedrine Hcl] Rash     Prior to Admission medications   Medication Sig Start Date End Date Taking? Authorizing Provider  acetaminophen (TYLENOL) 500 MG tablet Take 500 mg by mouth at bedtime as needed for moderate pain.   Yes [provider]  albuterol (PROVENTIL) (2.5 MG/3ML) 0.083% nebulizer solution Take 3 mLs (2.5 mg total) by nebulization every 4 (four) hours as needed for wheezing or shortness of breath.  10/16/18  Yes Icard, Octavio Graves, DO  aspirin 81 MG chewable tablet Chew 81 mg by mouth every morning.    Yes [provider]  atorvastatin (LIPITOR) 20 MG tablet TAKE ONE TABLET (20MG TOTAL) BY MOUTH DAILY Patient taking differently: Take 20 mg by mouth every morning.  01/08/19  Yes Satira Sark, MD  bisoprolol (ZEBETA) 10 MG tablet Take 1 tablet (10 mg total) by mouth daily. 10/10/18  Yes Satira Sark, MD  Blood Glucose Monitoring Suppl (ONE TOUCH ULTRA 2) w/Device KIT  01/24/19  Yes [provider]  Continuous Blood Gluc Receiver (FREESTYLE LIBRE 14 DAY READER) [place] See admin instructions. 04/11/19  Yes [provider]  Continuous Blood Gluc Sensor (FREESTYLE LIBRE SENSOR SYSTEM) MISC Use as directed 04/11/19  Yes Cirigliano, Mary K, DO  cyanocobalamin (,VITAMIN B-12,) 1000 MCG/ML injection Inject 1 mL (1,000 mcg total) into the muscle every 30 (thirty) days. 12/24/18  Yes Derek Jack, MD  Fluticasone-Umeclidin-Vilant (TRELEGY ELLIPTA) 100-62.5-25 MCG/INH AEPB Inhale 1 puff into the lungs daily. 04/25/19  Yes Lauraine Rinne, NP  gabapentin (NEURONTIN) 300 MG capsule Take 1 capsule (300 mg total) by mouth at bedtime. 03/26/19  Yes Cirigliano, Garvin Fila, DO  glucose blood (ONETOUCH ULTRA) test strip Use as instructed 02/26/19  Yes Cirigliano, Mary K, DO  glucose monitoring kit (FREESTYLE) monitoring kit Use to check blood sugar BID as directed 01/24/19  Yes Cirigliano, Mary K, DO  Lancets (ONETOUCH DELICA PLUS [id number]) MISC 1 each by Does not apply route 2 (two) times a day. 02/26/19  Yes Cirigliano, Mary K, DO  losartan (COZAAR) 100 MG tablet TAKE ONE (1) TABLET BY MOUTH EVERY DAY 10/08/19  Yes Satira Sark, MD  metFORMIN (GLUCOPHAGE) 1000 MG tablet Take 1 tablet (1,000 mg total) by mouth 2 (two) times daily with a meal. 08/19/19  Yes [name], NP  Misc. Devices MISC Please provide supplies (needle, syringe, alcohol swabs) needed for patient to  self-administer B-12 injections monthly. 12/24/18  Yes Derek Jack, MD  pantoprazole (PROTONIX) 40 MG tablet TAKE ONE (1) TABLET BY MOUTH EVERY DAY Patient taking differently: Take 40 mg by mouth every morning. TAKE ONE (1) TABLET BY MOUTH EVERY DAY 11/21/18  Yes Carlis Stable, NP  nicotine polacrilex (COMMIT) 4 MG lozenge Take 1 lozenge (4 mg total) by mouth as needed for smoking cessation. 04/22/19   Lauraine Rinne, NP  oxyCODONE (OXY IR/ROXICODONE) 5 MG immediate release tablet Take 0-1 tablet every 6 hours as needed only for severe postop pain 08/15/19   [provider]     Depression screen [facility] 2/9 10/27/2019 10/15/2019 08/19/2019 07/02/2019 07/23/2018  Decreased Interest 0 0 0 0 0  Down, Depressed, Hopeless 0 0 0 0 0  PHQ - 2 Score 0 0 0 0 0  Some recent data might be hidden     Fall Risk  10/27/2019 10/15/2019 08/19/2019 07/02/2019 07/23/2018  Falls in the past year? 0 0 0 0 No  Number falls in past yr: 0 0 0 0 -  Injury with Fall? 0 0 0 0 -  Follow up Falls evaluation completed;Education provided - - - -      PHYSICAL EXAM: BP 138/76   Ht '5\' 4"'  (1.626 m)   Wt 140 lb (63.5 kg)   BMI 24.03 kg/m    Wt Readings from Last 3 Encounters:  10/27/19 140 lb (63.5 kg)  10/15/19 140 lb (63.5 kg)  08/19/19 155 lb (70.3 kg)       Education/Counseling provided regarding diet and exercise, prevention of chronic diseases, smoking/tobacco cessation, if applicable, and reviewed "Covered Medicare Preventive Services."

## 2019-10-27 NOTE — Patient Instructions (Signed)
Thank you for taking time to come for your Medicare Wellness Visit. I appreciate your ongoing commitment to your health goals. Please review the following plan we discussed and let me know if I can assist you in the future.  Leroy Kennedy LPN  Preventive Care 54-63 Years Old, Female Preventive care refers to visits with your health care provider and lifestyle choices that can promote health and wellness. This includes:  A yearly physical exam. This may also be called an annual well check.  Regular dental visits and eye exams.  Immunizations.  Screening for certain conditions.  Healthy lifestyle choices, such as eating a healthy diet, getting regular exercise, not using drugs or products that contain nicotine and tobacco, and limiting alcohol use. What can I expect for my preventive care visit? Physical exam Your health care provider will check your:  Height and weight. This may be used to calculate body mass index (BMI), which tells if you are at a healthy weight.  Heart rate and blood pressure.  Skin for abnormal spots. Counseling Your health care provider may ask you questions about your:  Alcohol, tobacco, and drug use.  Emotional well-being.  Home and relationship well-being.  Sexual activity.  Eating habits.  Work and work Statistician.  Method of birth control.  Menstrual cycle.  Pregnancy history. What immunizations do I need?  Influenza (flu) vaccine  This is recommended every year. Tetanus, diphtheria, and pertussis (Tdap) vaccine  You may need a Td booster every 10 years. Varicella (chickenpox) vaccine  You may need this if you have not been vaccinated. Zoster (shingles) vaccine  You may need this after age 25. Measles, mumps, and rubella (MMR) vaccine  You may need at least one dose of MMR if you were born in 1957 or later. You may also need a second dose. Pneumococcal conjugate (PCV13) vaccine  You may need this if you have certain conditions  and were not previously vaccinated. Pneumococcal polysaccharide (PPSV23) vaccine  You may need one or two doses if you smoke cigarettes or if you have certain conditions. Meningococcal conjugate (MenACWY) vaccine  You may need this if you have certain conditions. Hepatitis A vaccine  You may need this if you have certain conditions or if you travel or work in places where you may be exposed to hepatitis A. Hepatitis B vaccine  You may need this if you have certain conditions or if you travel or work in places where you may be exposed to hepatitis B. Haemophilus influenzae type b (Hib) vaccine  You may need this if you have certain conditions. Human papillomavirus (HPV) vaccine  If recommended by your health care provider, you may need three doses over 6 months. You may receive vaccines as individual doses or as more than one vaccine together in one shot (combination vaccines). Talk with your health care provider about the risks and benefits of combination vaccines. What tests do I need? Blood tests  Lipid and cholesterol levels. These may be checked every 5 years, or more frequently if you are over 63 years old.  Hepatitis C test.  Hepatitis B test. Screening  Lung cancer screening. You may have this screening every year starting at age 8 if you have a 30-pack-year history of smoking and currently smoke or have quit within the past 15 years.  Colorectal cancer screening. All adults should have this screening starting at age 103 and continuing until age 42. Your health care provider may recommend screening at age 57 if you are  at increased risk. You will have tests every 1-10 years, depending on your results and the type of screening test.  Diabetes screening. This is done by checking your blood sugar (glucose) after you have not eaten for a while (fasting). You may have this done every 1-3 years.  Mammogram. This may be done every 1-2 years. Talk with your health care provider  about when you should start having regular mammograms. This may depend on whether you have a family history of breast cancer.  BRCA-related cancer screening. This may be done if you have a family history of breast, ovarian, tubal, or peritoneal cancers.  Pelvic exam and Pap test. This may be done every 3 years starting at age 28. Starting at age 45, this may be done every 5 years if you have a Pap test in combination with an HPV test. Other tests  Sexually transmitted disease (STD) testing.  Bone density scan. This is done to screen for osteoporosis. You may have this scan if you are at high risk for osteoporosis. Follow these instructions at home: Eating and drinking  Eat a diet that includes fresh fruits and vegetables, whole grains, lean protein, and low-fat dairy.  Take vitamin and mineral supplements as recommended by your health care provider.  Do not drink alcohol if: ? Your health care provider tells you not to drink. ? You are pregnant, may be pregnant, or are planning to become pregnant.  If you drink alcohol: ? Limit how much you have to 0-1 drink a day. ? Be aware of how much alcohol is in your drink. In the [place] one drink equals one 12 oz bottle of beer (355 mL), one 5 oz glass of wine (148 mL), or one 1 oz glass of hard liquor (44 mL). Lifestyle  Take daily care of your teeth and gums.  Stay active. Exercise for at least 30 minutes on 5 or more days each week.  Do not use any products that contain nicotine or tobacco, such as cigarettes, e-cigarettes, and chewing tobacco. If you need help quitting, ask your health care provider.  If you are sexually active, practice safe sex. Use a condom or other form of birth control (contraception) in order to prevent pregnancy and STIs (sexually transmitted infections).  If told by your health care provider, take low-dose aspirin daily starting at age 67. What's next?  Visit your health care provider once a year for a well  check visit.  Ask your health care provider how often you should have your eyes and teeth checked.  Stay up to date on all vaccines. This information is not intended to replace advice given to you by your health care provider. Make sure you discuss any questions you have with your health care provider. Document Revised: 05/30/2018 Document Reviewed: 05/30/2018 Elsevier Patient Education  2020 [employer].

## 2019-10-28 ENCOUNTER — Other Ambulatory Visit: Payer: Self-pay | Admitting: Cardiology

## 2019-10-28 DIAGNOSIS — M25641 Stiffness of right hand, not elsewhere classified: Secondary | ICD-10-CM | POA: Diagnosis not present

## 2019-10-28 DIAGNOSIS — M1811 Unilateral primary osteoarthritis of first carpometacarpal joint, right hand: Secondary | ICD-10-CM | POA: Diagnosis not present

## 2019-10-29 ENCOUNTER — Encounter: Payer: Self-pay | Admitting: Registered Nurse

## 2019-10-30 DIAGNOSIS — M25641 Stiffness of right hand, not elsewhere classified: Secondary | ICD-10-CM | POA: Diagnosis not present

## 2019-10-30 DIAGNOSIS — M1811 Unilateral primary osteoarthritis of first carpometacarpal joint, right hand: Secondary | ICD-10-CM | POA: Diagnosis not present

## 2019-11-03 ENCOUNTER — Other Ambulatory Visit: Payer: Self-pay

## 2019-11-03 ENCOUNTER — Ambulatory Visit (INDEPENDENT_AMBULATORY_CARE_PROVIDER_SITE_OTHER): Payer: PPO | Admitting: Pulmonary Disease

## 2019-11-03 ENCOUNTER — Encounter: Payer: Self-pay | Admitting: Pulmonary Disease

## 2019-11-03 DIAGNOSIS — J449 Chronic obstructive pulmonary disease, unspecified: Secondary | ICD-10-CM | POA: Diagnosis not present

## 2019-11-03 DIAGNOSIS — Z7951 Long term (current) use of inhaled steroids: Secondary | ICD-10-CM

## 2019-11-03 DIAGNOSIS — Z7902 Long term (current) use of antithrombotics/antiplatelets: Secondary | ICD-10-CM | POA: Insufficient documentation

## 2019-11-03 DIAGNOSIS — Z7189 Other specified counseling: Secondary | ICD-10-CM | POA: Diagnosis not present

## 2019-11-03 DIAGNOSIS — F17210 Nicotine dependence, cigarettes, uncomplicated: Secondary | ICD-10-CM | POA: Diagnosis not present

## 2019-11-03 DIAGNOSIS — Z720 Tobacco use: Secondary | ICD-10-CM

## 2019-11-03 DIAGNOSIS — Z0000 Encounter for general adult medical examination without abnormal findings: Secondary | ICD-10-CM | POA: Insufficient documentation

## 2019-11-03 DIAGNOSIS — Z716 Tobacco abuse counseling: Secondary | ICD-10-CM | POA: Diagnosis not present

## 2019-11-03 MED ORDER — PREDNISONE 10 MG PO TABS: ORAL_TABLET | ORAL | 0 refills | Status: DC

## 2019-11-03 MED ORDER — AZITHROMYCIN 250 MG PO TABS: ORAL_TABLET | ORAL | 0 refills | Status: DC

## 2019-11-03 NOTE — Progress Notes (Signed)
Virtual Visit via Telephone Note  I connected with Tammy Mcpherson on 11/03/19 at 11:00 AM EST by telephone and verified that I am speaking with the correct person using two identifiers.  Location: Patient: Home Provider: [facility] - [id number], [place], [place], [place] [address]   I discussed the limitations, risks, security and privacy concerns of performing an evaluation and management service by telephone and the availability of in person appointments. I also discussed with the patient that there may be a patient responsible charge related to this service. The patient expressed understanding and agreed to proceed.  Patient consented to consult via telephone: Yes People present and their role in pt care: Pt   History of Present Illness:  64 year old female current every day smoker followed in our office for COPD  PMH: Hypertension, anemia, hyperlipidemia, gastric AVM, PAD, vitamin D deficiency, vitamin B12 deficiency, prediabetes Smoker/ Smoking History: Current Everyday Smoker.  Smoking 2-3 cigarettes per day.  44-pack-year smoking history Maintenance: Trelegy Ellipta  Pt of: Dr. [name]   Chief complaint: Inhaler questions   64 year old female current everyday smoker followed in our office for COPD.  Patient contacted our office today on 11/03/2019 to report that she does not feel like the trilogy Ellipta inhaler is working as well has her Symbicort 160.  She is interested in transitioning back to her previous inhaler combination.  Patient does continue to smoke 2 to 3 cigarettes a day.  She is not interested in stopping smoking at this time.  She is currently using her Nicotrol inhaler 4-5 times a day from primary care.  Patient reports she does have ongoing cough, congestion, intermittent wheezing.  She feels that her breathing is worse in the evening.  Patient is also unsure whether not she would be a good candidate for the Covid vaccine.  We will discuss this  today.  Smoking assessment and cessation counseling  Patient currently smoking: 2-3 cigarettes a day  I have advised the patient to quit/stop smoking as soon as possible due to high risk for multiple medical problems.  It will also be very difficult for [place] to manage patient's  respiratory symptoms and status if we continue to expose her lungs to a known irritant.  We do not advise e-cigarettes as a form of stopping smoking.  Patient is not willing to quit smoking. Hasnt set quit date.   Pt using nicotrol inhalers from PCP. Using 4-5x a day.  Not using any other NRT besides.   I have advised the patient that we can assist and have options of nicotine replacement therapy, provided smoking cessation education today, provided smoking cessation counseling, and provided cessation resources.  Follow-up next office visit office visit for assessment of smoking cessation.  Smoking cessation counseling advised for: 5 min   Observations/Objective:  10/08/2018-CBC with differential-eosinophils relative 1, eosinophils absolute 0.2  04/30/2016-CT angios chest- cardiomegaly with interstitial pulmonary edema very small bilateral pleural effusions, no pulmonary emboli identified  12/04/2018-CT chest lung cancer screening- biapical pleural-parenchymal scarring, mild centrilobular emphysema, lung RADS 1  03/20/2018-echocardiogram-LV ejection fraction 65 to [id number], grade 1 diastolic dysfunction, mild LVH  11/01/2018- pulmonary function test-FVC 2.42 (76% predicted), postbronchodilator ratio 74, postbronchodilator FEV1 1.52 (62% predicted), no significant bronchodilator response, DLCO was 51 >>>Patient did take Spiriva prior to testing >>>Patient has not taken Symbicort today  Social History   Tobacco Use  Smoking Status Current Every Day Smoker  . Packs/day: 1.00  . Years: 44.00  . Pack years:  44.00  . Types: Cigarettes  . Start date: 05/10/1975  Smokeless Tobacco Never Used  Tobacco Comment   pack per  day 1.15.20   Immunization History  Administered Date(s) Administered  . Influenza,inj,Quad PF,6+ Mos 07/04/2017, 07/10/2018, 07/02/2019  . Pneumococcal Polysaccharide-23 11/01/2018      Assessment and Plan:  Chronic obstructive pulmonary disease ([place]) Plan: Z-Pak today Prednisone taper today Close follow-up in our office in 1 to 2 weeks, patient would be a great candidate for trial of Breztri inhaler  Emphasized need for patient to stop smoking Patient declined pharmacy referral for smoking cessation Continue Nicotrol inhaler use as managed by primary care   Healthcare maintenance Reviewed Covid vaccine with patient.  She would be an excellent candidate for the Covid vaccine.  Her having type 2 diabetes does not exclude her from receiving the Covid vaccine if and when it is available to her.  Our office recommendation is that she proceed forward with receiving the Covid vaccine when it is available to her.  Tobacco abuse Assessment: 33-pack-year smoking history Current everyday smoker, 2-3 cigarettes daily Previous reaction to Wellbutrin, patient never started trial of Chantix, patient has interested in nicotine replacement therapies Lung cancer screening CT in 2020 showing lung RADS 1, repeat in 1 year  Plan: [facility] clinical pharmacy team referral for smoking cessation, patient declined Reviewed that patient can contact our office sooner if she would like Repeat lung cancer screening CT in 2021 Follow-up in 1 to 2 weeks   Follow Up Instructions:  Return in about 2 weeks (around 11/17/2019), or if symptoms worsen or fail to improve, for Follow up with [name] FNP-C.   I discussed the assessment and treatment plan with the patient. The patient was provided an opportunity to ask questions and all were answered. The patient agreed with the plan and demonstrated an understanding of the instructions.   The patient was advised to call back or  seek an in-person evaluation if the symptoms worsen or if the condition fails to improve as anticipated.  I provided 24 minutes of non-face-to-face time during this encounter.   Lauraine Rinne, NP

## 2019-11-03 NOTE — Assessment & Plan Note (Signed)
Reviewed Covid vaccine with patient.  She would be an excellent candidate for the Covid vaccine.  Her having type 2 diabetes does not exclude her from receiving the Covid vaccine if and when it is available to her.  Our office recommendation is that she proceed forward with receiving the Covid vaccine when it is available to her.

## 2019-11-03 NOTE — Assessment & Plan Note (Signed)
Assessment: 33-pack-year smoking history Current everyday smoker, 2-3 cigarettes daily Previous reaction to Wellbutrin, patient never started trial of Chantix, patient has interested in nicotine replacement therapies Lung cancer screening CT in 2020 showing lung RADS 1, repeat in 1 year  Plan: [place] clinical pharmacy team referral for smoking cessation, patient declined Reviewed that patient can contact our office sooner if she would like Repeat lung cancer screening CT in 2021 Follow-up in 1 to 2 weeks

## 2019-11-03 NOTE — Patient Instructions (Addendum)
You were seen today by Lauraine Rinne, NP  for:   1. Chronic obstructive pulmonary disease, unspecified COPD type ([place])  - azithromycin (ZITHROMAX) 250 MG tablet; 500mg  (two tablets) today, then 250mg  (1 tablet) for the next 4 days  Dispense: 6 tablet; Refill: 0 - predniSONE (DELTASONE) 10 MG tablet; 4 tabs for 2 days, then 3 tabs for 2 days, 2 tabs for 2 days, then 1 tab for 2 days, then stop  Dispense: 20 tablet; Refill: 0  Trelegy Ellipta  >>> 1 puff daily in the morning >>>rinse mouth out after use  >>> This inhaler contains 3 medications that help manage her respiratory status, contact our office if you cannot afford this medication or unable to remain on this medication  Note your daily symptoms > remember "red flags" for COPD:   >>>Increase in cough >>>increase in sputum production >>>increase in shortness of breath or activity  intolerance.   If you notice these symptoms, please call the office to be seen.    2. Tobacco abuse  We recommend that you stop smoking.  >>>You need to set a quit date >>>If you have friends or family who smoke, let them know you are trying to quit and not to smoke around you or in your living environment  Smoking Cessation Resources:  1 800 QUIT NOW  >>> Patient to call this resource and utilize it to help support her quit smoking >>> Keep up your hard work with stopping smoking  You can also contact the [facility] >>>For smoking cessation classes call [phone]  We do not recommend using e-cigarettes as a form of stopping smoking  You can sign up for smoking cessation support texts and information:  >>>https://smokefree.gov/smokefreetxt    3. Healthcare maintenance  .COVID-19 Vaccine Information can be found at: ShippingScam.co.uk For questions related to vaccine distribution or appointments, please email [email or web address] [email or web address] or call 4070215601.      We  recommend today:   Meds ordered this encounter  Medications  . azithromycin (ZITHROMAX) 250 MG tablet    Sig: 500mg  (two tablets) today, then 250mg  (1 tablet) for the next 4 days    Dispense:  6 tablet    Refill:  0  . predniSONE (DELTASONE) 10 MG tablet    Sig: 4 tabs for 2 days, then 3 tabs for 2 days, 2 tabs for 2 days, then 1 tab for 2 days, then stop    Dispense:  20 tablet    Refill:  0    Follow Up:    Return in about 2 weeks (around 11/17/2019), or if symptoms worsen or fail to improve, for Follow up with [name] FNP-C.   Please do your part to reduce the spread of COVID-19:      Reduce your risk of any infection  and COVID19 by using the similar precautions used for avoiding the common cold or flu:  Marland Kitchen Wash your hands often with soap and warm water for at least 20 seconds.  If soap and water are not readily available, use an alcohol-based hand sanitizer with at least 60% alcohol.  . If coughing or sneezing, cover your mouth and nose by coughing or sneezing into the elbow areas of your shirt or coat, into a tissue or into your sleeve (not your hands). Langley Gauss A MASK when in public  . Avoid shaking hands with others and consider head nods or verbal greetings only. . Avoid touching your eyes, nose, or mouth with unwashed hands.  Marland Kitchen  Avoid close contact with people who are sick. . Avoid places or events with large numbers of people in one location, like concerts or sporting events. . If you have some symptoms but not all symptoms, continue to monitor at home and seek medical attention if your symptoms worsen. . If you are having a medical emergency, call 911.   [place] / e-Visit: [email or web address]         [facility]: [facility]: [id number]                   [facility]: [id number]     It is flu season:     >>> Best ways to protect herself from the flu: Receive the yearly flu vaccine, practice good hand hygiene washing with soap and also using hand sanitizer when available, eat a nutritious meals, get adequate rest, hydrate appropriately   Please contact the office if your symptoms worsen or you have concerns that you are not improving.   Thank you for choosing [facility] for your healthcare, and for allowing [place] to partner with you on your healthcare journey. I am thankful to be able to provide care to you today.   [name] FNP-C

## 2019-11-03 NOTE — Assessment & Plan Note (Signed)
Plan: Z-Pak today Prednisone taper today Close follow-up in our office in 1 to 2 weeks, patient would be a great candidate for trial of Breztri inhaler  Emphasized need for patient to stop smoking Patient declined pharmacy referral for smoking cessation Continue Nicotrol inhaler use as managed by primary care

## 2019-11-04 NOTE — Progress Notes (Signed)
PCCM: I agree the most important opportunity for her to breath better will be smoking cessation Garner Nash, DO [facility] 11/04/2019 4:57 PM

## 2019-11-11 DIAGNOSIS — R200 Anesthesia of skin: Secondary | ICD-10-CM | POA: Diagnosis not present

## 2019-11-11 DIAGNOSIS — M1811 Unilateral primary osteoarthritis of first carpometacarpal joint, right hand: Secondary | ICD-10-CM | POA: Diagnosis not present

## 2019-11-11 NOTE — Progress Notes (Signed)
'@Patient'  ID: Tammy Mcpherson, adult    DOB: [birth year], 64 y.o.   MRN: [id number]  Chief Complaint  Patient presents with  . Follow-up    1 wk f/u for COPD. States she is still coughing. Non-productive but it does leave a bitter taste in her mouth. Still having issues with SOB. Mid back pain.     Referring provider: [name], NP  HPI:  64 year old female current every day smoker followed in our office for COPD  PMH: Hypertension, anemia, hyperlipidemia, gastric AVM, PAD, vitamin D deficiency, vitamin B12 deficiency, prediabetes Smoker/ Smoking History: Current Everyday Smoker.  Smoking 2-3 cigarettes per day.  44-pack-year smoking history Maintenance: Trelegy Ellipta  Pt of: Dr. [name]   11/12/2019  - Visit   64 year old female current everyday smoker followed in our office for COPD.  Patient was treated telephonically as a COPD exacerbation 2 weeks ago.  Unfortunately patient only took 1 day of the antibiotic due to her suspicion that this is what caused her blood sugars to go up into the 500s.  She did not contact our office regarding the symptoms.  Patient reported that she took her azithromycin for 1 day as well as her prednisone for 1 day.  She stopped both medications when her blood sugars were elevated in the 500s.  She did not realize that she should contact our office for these matters.  She is reporting also today that her blood sugars are ranging between 50s in the morning and above 400-500 when she took the prednisone for 1 day.  She has not yet followed up with primary care regarding this.  Her only diabetic medication is Metformin.  She also has concerns with Trelegy Ellipta as she believes that it is not working as well for her as it initially did.  She is unsure if she would benefit from having inhaler that would have a second dose in the afternoon.  We can discuss this today  Thankfully the patient is working diligently to stop smoking.  She has found success by  using a Nicotrol inhaler.  She has decreased her smoking from 1 pack a day to 2 to 3 cigarettes a day.   Questionaires / Pulmonary Flowsheets:   MMRC: mMRC Dyspnea Scale mMRC Score  11/12/2019 1    Tests:   10/08/2018-CBC with differential-eosinophils relative 1, eosinophils absolute 0.2  04/30/2016-CT angios chest- cardiomegaly with interstitial pulmonary edema very small bilateral pleural effusions, no pulmonary emboli identified  12/04/2018-CT chest lung cancer screening- biapical pleural-parenchymal scarring, mild centrilobular emphysema, lung RADS 1  03/20/2018-echocardiogram-LV ejection fraction 65 to 88%, grade 1 diastolic dysfunction, mild LVH  11/01/2018- pulmonary function test-FVC 2.42 (76% predicted), postbronchodilator ratio 74, postbronchodilator FEV1 1.52 (62% predicted), no significant bronchodilator response, DLCO was 51 >>>Patient did take Spiriva prior to testing >>>Patient has not taken Symbicort today  FENO:  No results found for: NITRICOXIDE  PFT: PFT Results Latest Ref Rng & Units 11/01/2018  FVC-Predicted Pre % 76  FVC-Post L 2.07  FVC-Predicted Post % 65  Pre FEV1/FVC % % 65  Post FEV1/FCV % % 74  FEV1-Pre L 1.57  FEV1-Predicted Pre % 64  FEV1-Post L 1.52  DLCO UNC% % 51  DLCO COR %Predicted % 65  TLC L 4.12  TLC % Predicted % 84  RV % Predicted % 90    WALK:  SIX MIN WALK 11/01/2018  Supplimental Oxygen during Test? (L/min) No  Tech Comments: Pt did not desat, however, she could not complete  all 3x laps due to SOB, mild dizziness, and mild chest pain.     Imaging: No results found.  Lab Results:  CBC    Component Value Date/Time   WBC 15.4 (H) 09/03/2019 1301   RBC 4.50 09/03/2019 1301   HGB 11.0 (L) 09/03/2019 1301   HGB 11.8 07/02/2019 1539   HCT 37.5 09/03/2019 1301   HCT 35.9 07/02/2019 1539   PLT 628 (H) 09/03/2019 1301   PLT 518 (H) 07/02/2019 1539   MCV 83.3 09/03/2019 1301   MCV 86 07/02/2019 1539   MCH 24.4 (L) 09/03/2019  1301   MCHC 29.3 (L) 09/03/2019 1301   RDW 15.4 09/03/2019 1301   RDW 13.6 07/02/2019 1539   LYMPHSABS 1.8 09/03/2019 1301   LYMPHSABS 1.8 07/02/2019 1539   MONOABS 1.7 (H) 09/03/2019 1301   EOSABS 0.2 09/03/2019 1301   EOSABS 0.1 07/02/2019 1539   BASOSABS 0.1 09/03/2019 1301   BASOSABS 0.1 07/02/2019 1539    BMET    Component Value Date/Time   NA 139 10/13/2019 1431   K 4.5 10/13/2019 1431   CL 104 10/13/2019 1431   CO2 21 10/13/2019 1431   GLUCOSE 201 (H) 10/13/2019 1431   GLUCOSE 238 (H) 09/03/2019 1301   BUN 8 10/13/2019 1431   CREATININE 0.60 10/13/2019 1431   CREATININE 0.73 09/28/2017 1201   CALCIUM 10.2 10/13/2019 1431   GFRNONAA 98 10/13/2019 1431   GFRNONAA 90 09/28/2017 1201   GFRAA 113 10/13/2019 1431   GFRAA 104 09/28/2017 1201    BNP    Component Value Date/Time   BNP 446.0 (H) 04/30/2016 0719    ProBNP No results found for: PROBNP  Specialty Problems      Pulmonary Problems   Chronic obstructive pulmonary disease ([place])    11/01/2018- pulmonary function test-FVC 2.42 (76% predicted), postbronchodilator ratio 74, postbronchodilator FEV1 1.52 (62% predicted), no significant bronchodilator response, DLCO was 51 >>>Patient did take Spiriva prior to testing >>>Patient has not taken Symbicort today  12/04/2018-CT chest lung cancer screening- biapical pleural-parenchymal scarring, mild centrilobular emphysema, lung RADS 1      Dyspnea on effort      Allergies  Allergen Reactions  . Bee Venom Swelling  . Codeine Anaphylaxis    Tongue swelled  . Penicillins Swelling and Rash    Has patient had a PCN reaction causing immediate rash, facial/tongue/throat swelling, SOB or lightheadedness with hypotension: No, delayed Has patient had a PCN reaction causing severe rash involving mucus membranes or skin necrosis: No Has patient had a PCN reaction that required hospitalization No Has patient had a PCN reaction occurring within the last 10 years: No If all  of the above answers are "NO", then may proceed with Cephalosporin use.   . Bupropion Other (See Comments)    "Made my skin crawl"  . Sudafed [Pseudoephedrine Hcl] Rash    Immunization History  Administered Date(s) Administered  . Influenza,inj,Quad PF,6+ Mos 07/04/2017, 07/10/2018, 07/02/2019  . Pneumococcal Polysaccharide-23 11/01/2018    Past Medical History:  Diagnosis Date  . Anemia 04/30/2016  . Anxiety   . Blood transfusion without reported diagnosis   . Cardiomyopathy ([place])    a. EF 40-45% by echo in 04/2016 b. Improved to 60-65% by repeat imaging in 2018  . CHF (congestive heart failure) ([place])   . COPD (chronic obstructive pulmonary disease) ([place])   . Coronary artery calcification seen on CT scan   . Essential hypertension   . GERD (gastroesophageal reflux disease)   .  History of bronchitis   . History of GI bleed   . Hyperlipidemia   . Iron deficiency anemia 05/12/2016   Bleeding ulcers  . Pollen allergy   . PSVT (paroxysmal supraventricular tachycardia) ([place])   . PVC's (premature ventricular contractions)     Tobacco History: Social History   Tobacco Use  Smoking Status Current Every Day Smoker  . Packs/day: 1.00  . Years: 44.00  . Pack years: 44.00  . Types: Cigarettes  . Start date: 05/10/1975  Smokeless Tobacco Never Used  Tobacco Comment   pack per day 1.15.20   Ready to quit: Yes Counseling given: Yes Comment: pack per day 1.15.20  Smoking assessment and cessation counseling  Patient currently smoking: 2-3 cigs a day  I have advised the patient to quit/stop smoking as soon as possible due to high risk for multiple medical problems.  It will also be very difficult for [place] to manage patient's  respiratory symptoms and status if we continue to expose her lungs to a known irritant.  We do not advise e-cigarettes as a form of stopping smoking.  Patient is willing to quit smoking. Trying to quit. Using NRT - nicotrol inhaler.  Offered long-term nicotine  replacement like a patch prescription.  Patient declined today.  Also offered appointment with clinical pharmacy team, patient declined again.  I have advised the patient that we can assist and have options of nicotine replacement therapy, provided smoking cessation education today, provided smoking cessation counseling, and provided cessation resources.  Follow-up next office visit office visit for assessment of smoking cessation.   Smoking cessation counseling advised for: 5 min   Outpatient Encounter Medications as of 11/12/2019  Medication Sig  . acetaminophen (TYLENOL) 500 MG tablet Take 500 mg by mouth at bedtime as needed for moderate pain.  Marland Kitchen albuterol (PROVENTIL) (2.5 MG/3ML) 0.083% nebulizer solution Take 3 mLs (2.5 mg total) by nebulization every 4 (four) hours as needed for wheezing or shortness of breath.  Marland Kitchen aspirin 81 MG chewable tablet Chew 81 mg by mouth every morning.   Marland Kitchen atorvastatin (LIPITOR) 20 MG tablet TAKE ONE TABLET (20MG TOTAL) BY MOUTH DAILY (Patient taking differently: Take 20 mg by mouth every morning. )  . bisoprolol (ZEBETA) 10 MG tablet TAKE ONE (1) TABLET BY MOUTH EVERY DAY  . Blood Glucose Monitoring Suppl (ONE TOUCH ULTRA 2) w/Device KIT   . Continuous Blood Gluc Receiver (FREESTYLE LIBRE 14 DAY READER) DEVI See admin instructions.  . Continuous Blood Gluc Sensor (FREESTYLE LIBRE SENSOR SYSTEM) MISC Use as directed  . cyanocobalamin (,VITAMIN B-12,) 1000 MCG/ML injection Inject 1 mL (1,000 mcg total) into the muscle every 30 (thirty) days.  Marland Kitchen gabapentin (NEURONTIN) 300 MG capsule Take 1 capsule (300 mg total) by mouth at bedtime.  Marland Kitchen glucose blood (ONETOUCH ULTRA) test strip Use as instructed  . glucose monitoring kit (FREESTYLE) monitoring kit Use to check blood sugar BID as directed  . Lancets (ONETOUCH DELICA PLUS [id number]) MISC 1 each by Does not apply route 2 (two) times a day.  . losartan (COZAAR) 100 MG tablet TAKE ONE (1) TABLET BY MOUTH EVERY DAY  .  metFORMIN (GLUCOPHAGE) 1000 MG tablet Take 1 tablet (1,000 mg total) by mouth 2 (two) times daily with a meal.  . Misc. Devices MISC Please provide supplies (needle, syringe, alcohol swabs) needed for patient to self-administer B-12 injections monthly.  . nicotine polacrilex (COMMIT) 4 MG lozenge Take 1 lozenge (4 mg total) by mouth as needed for smoking cessation.  Marland Kitchen  pantoprazole (PROTONIX) 40 MG tablet TAKE ONE (1) TABLET BY MOUTH EVERY DAY (Patient taking differently: Take 40 mg by mouth every morning. TAKE ONE (1) TABLET BY MOUTH EVERY DAY)  . [DISCONTINUED] albuterol (PROVENTIL) (2.5 MG/3ML) 0.083% nebulizer solution Take 3 mLs (2.5 mg total) by nebulization every 4 (four) hours as needed for wheezing or shortness of breath.  . [DISCONTINUED] albuterol (PROVENTIL) (2.5 MG/3ML) 0.083% nebulizer solution Take 3 mLs (2.5 mg total) by nebulization every 4 (four) hours as needed for wheezing or shortness of breath.  . [DISCONTINUED] albuterol (PROVENTIL) (2.5 MG/3ML) 0.083% nebulizer solution Take 3 mLs (2.5 mg total) by nebulization every 4 (four) hours as needed for wheezing or shortness of breath.  . [DISCONTINUED] Fluticasone-Umeclidin-Vilant (TRELEGY ELLIPTA) 100-62.5-25 MCG/INH AEPB Inhale 1 puff into the lungs daily.  . [DISCONTINUED] oxyCODONE (OXY IR/ROXICODONE) 5 MG immediate release tablet Take 0-1 tablet every 6 hours as needed only for severe postop pain  . azithromycin (ZITHROMAX) 250 MG tablet 529m (two tablets) today, then 2524m(1 tablet) for the next 4 days  . Budeson-Glycopyrrol-Formoterol (BREZTRI AEROSPHERE) 160-9-4.8 MCG/ACT AERO Inhale 2 puffs into the lungs 2 (two) times daily.  . [DISCONTINUED] azithromycin (ZITHROMAX) 250 MG tablet 50066mtwo tablets) today, then 250m44m tablet) for the next 4 days  . [DISCONTINUED] predniSONE (DELTASONE) 10 MG tablet 4 tabs for 2 days, then 3 tabs for 2 days, 2 tabs for 2 days, then 1 tab for 2 days, then stop   No facility-administered  encounter medications on file as of 11/12/2019.     Review of Systems  Review of Systems  Constitutional: Positive for fatigue. Negative for activity change, chills, fever and unexpected weight change.  HENT: Negative for postnasal drip, rhinorrhea, sinus pressure, sinus pain and sore throat.   Eyes: Negative.   Respiratory: Positive for cough, shortness of breath and wheezing.   Cardiovascular: Negative for chest pain and palpitations.  Gastrointestinal: Negative for constipation, diarrhea, nausea and vomiting.  Endocrine: Negative.   Genitourinary: Negative.   Musculoskeletal: Negative.   Skin: Negative.   Neurological: Negative for dizziness and headaches.  Psychiatric/Behavioral: Negative.  Negative for dysphoric mood. The patient is not nervous/anxious.   All other systems reviewed and are negative.    Physical Exam  BP 136/82 (BP Location: Right Arm, Patient Position: Sitting, Cuff Size: Normal)   Pulse 84   Temp (!) 97.2 F (36.2 C) (Temporal)   Ht '5\' 4"'  (1.626 m)   Wt 156 lb (70.8 kg)   SpO2 96%   BMI 26.78 kg/m   Wt Readings from Last 5 Encounters:  11/12/19 156 lb (70.8 kg)  10/27/19 140 lb (63.5 kg)  10/15/19 140 lb (63.5 kg)  08/19/19 155 lb (70.3 kg)  08/13/19 154 lb (69.9 kg)    BMI Readings from Last 5 Encounters:  11/12/19 26.78 kg/m  10/27/19 24.03 kg/m  10/15/19 24.03 kg/m  08/19/19 26.61 kg/m  08/13/19 26.43 kg/m     Physical Exam Vitals and nursing note reviewed.  Constitutional:      General: She is not in acute distress.    Appearance: Normal appearance. She is normal weight.  HENT:     Head: Normocephalic and atraumatic.     Right Ear: Hearing and external ear normal.     Left Ear: Hearing and external ear normal.     Nose: Nose normal. No mucosal edema or rhinorrhea.     Right Turbinates: Not enlarged.     Left Turbinates: Not enlarged.  Mouth/Throat:     Mouth: Mucous membranes are dry.     Pharynx: Oropharynx is clear.  No oropharyngeal exudate.  Eyes:     Pupils: Pupils are equal, round, and reactive to light.  Cardiovascular:     Rate and Rhythm: Normal rate and regular rhythm.     Pulses: Normal pulses.     Heart sounds: Normal heart sounds. No murmur.  Pulmonary:     Effort: Pulmonary effort is normal.     Breath sounds: No decreased breath sounds, wheezing or rales.     Comments: Diminished throughout exam Musculoskeletal:     Cervical back: Normal range of motion.     Right lower leg: No edema.     Left lower leg: No edema.  Lymphadenopathy:     Cervical: No cervical adenopathy.  Skin:    General: Skin is warm and dry.     Capillary Refill: Capillary refill takes less than 2 seconds.     Findings: No erythema or rash.  Neurological:     General: No focal deficit present.     Mental Status: She is alert and oriented to person, place, and time.     Motor: No weakness.     Coordination: Coordination normal.     Gait: Gait is intact. Gait normal.  Psychiatric:        Mood and Affect: Mood normal.        Behavior: Behavior normal. Behavior is cooperative.        Thought Content: Thought content normal.        Judgment: Judgment normal.       Assessment & Plan:   Chronic obstructive pulmonary disease (HCC) Plan: Trial of Breztri inhaler today  Hold Trelegy Ellipta Z-Pak today 1 week follow-up with our office We will hold prednisone taper as patient had elevated blood sugars as well as has no audible wheezing on exam today Emphasized again the importance of patient stopping smoking completely, praised patient for her success so far   Type 2 diabetes mellitus ([place]) Questionable range of blood sugars between 50-500. Patient only on metformin  Plan: Instructed patient to follow-up with primary care regarding her blood sugars   Tobacco abuse Praised patient for her success so far by reducing her smoking  Plan: Continue Nicotrol Inhaler Offered clinical pharmacy team referral,  patient declined Offered 21 mg nicotine patch, patient declined Repeat lung cancer screening CT in March/2021 Follow-up in 1 week    Return in about 1 week (around 11/19/2019), or if symptoms worsen or fail to improve, for Follow up with [name] FNP-C.   Lauraine Rinne, NP 11/12/2019   This appointment required 26 minutes of patient care (this includes precharting, chart review, review of results, face-to-face care, etc.).

## 2019-11-12 ENCOUNTER — Encounter: Payer: Self-pay | Admitting: Pulmonary Disease

## 2019-11-12 ENCOUNTER — Ambulatory Visit: Payer: PPO | Admitting: Pulmonary Disease

## 2019-11-12 ENCOUNTER — Other Ambulatory Visit: Payer: Self-pay

## 2019-11-12 VITALS — BP 136/82 | HR 84 | Temp 97.2°F | Ht 64.0 in | Wt 156.0 lb

## 2019-11-12 DIAGNOSIS — E1169 Type 2 diabetes mellitus with other specified complication: Secondary | ICD-10-CM

## 2019-11-12 DIAGNOSIS — J449 Chronic obstructive pulmonary disease, unspecified: Secondary | ICD-10-CM

## 2019-11-12 DIAGNOSIS — Z720 Tobacco use: Secondary | ICD-10-CM

## 2019-11-12 DIAGNOSIS — E119 Type 2 diabetes mellitus without complications: Secondary | ICD-10-CM | POA: Insufficient documentation

## 2019-11-12 DIAGNOSIS — F17210 Nicotine dependence, cigarettes, uncomplicated: Secondary | ICD-10-CM

## 2019-11-12 MED ORDER — BREZTRI AEROSPHERE 160-9-4.8 MCG/ACT IN AERO: 2.0000 | INHALATION_SPRAY | Freq: Two times a day (BID) | RESPIRATORY_TRACT | 0 refills | Status: DC

## 2019-11-12 MED ORDER — ALBUTEROL SULFATE (2.5 MG/3ML) 0.083% IN NEBU: 2.5000 mg | INHALATION_SOLUTION | RESPIRATORY_TRACT | 5 refills | Status: DC | PRN

## 2019-11-12 MED ORDER — AZITHROMYCIN 250 MG PO TABS: ORAL_TABLET | ORAL | 0 refills | Status: DC

## 2019-11-12 NOTE — Assessment & Plan Note (Signed)
Questionable range of blood sugars between 50-500. Patient only on metformin  Plan: Instructed patient to follow-up with primary care regarding her blood sugars

## 2019-11-12 NOTE — Assessment & Plan Note (Signed)
Praised patient for her success so far by reducing her smoking  Plan: Continue Nicotrol Inhaler Offered clinical pharmacy team referral, patient declined Offered 21 mg nicotine patch, patient declined Repeat lung cancer screening CT in March/2021 Follow-up in 1 week

## 2019-11-12 NOTE — Assessment & Plan Note (Signed)
Plan: Trial of Breztri inhaler today  Hold Trelegy Ellipta Z-Pak today 1 week follow-up with our office We will hold prednisone taper as patient had elevated blood sugars as well as has no audible wheezing on exam today Emphasized again the importance of patient stopping smoking completely, praised patient for her success so far

## 2019-11-12 NOTE — Patient Instructions (Addendum)
You were seen today by Lauraine Rinne, NP  for:   1. Chronic obstructive pulmonary disease, unspecified COPD type (HCC)  Azithromycin 250mg  tablet  >>>Take 2 tablets (500mg  total) today, and then 1 tablet (250mg ) for the next four days  >>>take with food  >>>can also take probiotic and / or yogurt while on antibiotic   Trial of Breztri >>> 2 puffs in the morning right when you wake up, rinse out your mouth after use, 12 hours later 2 puffs, rinse after use >>> Take this daily, no matter what >>> This is not a rescue inhaler   STOP Trelegy Ellipta   Note your daily symptoms > remember "red flags" for COPD:   >>>Increase in cough >>>increase in sputum production >>>increase in shortness of breath or activity  intolerance.   If you notice these symptoms, please call the office to be seen.   Continue to work on stopping smoking  Follow-up with our office in 1 week  2. Tobacco abuse  Great progress on stopping smoking keep up the hard work!  Continue to use your Nicotrol Inhaler  We recommend that you stop smoking.  >>>You need to set a quit date >>>If you have friends or family who smoke, let them know you are trying to quit and not to smoke around you or in your living environment  Smoking Cessation Resources:  1 800 QUIT NOW  >>> Patient to call this resource and utilize it to help support her quit smoking >>> Keep up your hard work with stopping smoking  You can also contact the [facility] >>>For smoking cessation classes call [phone]  We do not recommend using e-cigarettes as a form of stopping smoking  You can sign up for smoking cessation support texts and information:  >>>https://smokefree.gov/smokefreetxt    3. Type 2 diabetes mellitus with other specified complication, unspecified whether long term insulin use ([place])  Please contact primary care regarding your blood sugars.  I do not believe that the blood sugars that you are describing are  directly related to your Metformin but I do believe they need to be evaluated.   We recommend today:   Meds ordered this encounter  Medications  . DISCONTD: albuterol (PROVENTIL) (2.5 MG/3ML) 0.083% nebulizer solution    Sig: Take 3 mLs (2.5 mg total) by nebulization every 4 (four) hours as needed for wheezing or shortness of breath.    Dispense:  75 mL    Refill:  5  . azithromycin (ZITHROMAX) 250 MG tablet    Sig: 500mg  (two tablets) today, then 250mg  (1 tablet) for the next 4 days    Dispense:  6 tablet    Refill:  0  . DISCONTD: albuterol (PROVENTIL) (2.5 MG/3ML) 0.083% nebulizer solution    Sig: Take 3 mLs (2.5 mg total) by nebulization every 4 (four) hours as needed for wheezing or shortness of breath.    Dispense:  75 mL    Refill:  5  . albuterol (PROVENTIL) (2.5 MG/3ML) 0.083% nebulizer solution    Sig: Take 3 mLs (2.5 mg total) by nebulization every 4 (four) hours as needed for wheezing or shortness of breath.    Dispense:  75 mL    Refill:  5    Dx: J44.9    Follow Up:    Return in about 1 week (around 11/19/2019), or if symptoms worsen or fail to improve, for Follow up with [name] FNP-C.   Please do your part to reduce the  spread of COVID-19:      Reduce your risk of any infection  and COVID19 by using the similar precautions used for avoiding the common cold or flu:  Marland Kitchen Wash your hands often with soap and warm water for at least 20 seconds.  If soap and water are not readily available, use an alcohol-based hand sanitizer with at least 60% alcohol.  . If coughing or sneezing, cover your mouth and nose by coughing or sneezing into the elbow areas of your shirt or coat, into a tissue or into your sleeve (not your hands). Langley Gauss A MASK when in public  . Avoid shaking hands with others and consider head nods or verbal greetings only. . Avoid touching your eyes, nose, or mouth with unwashed hands.  . Avoid close contact with people who are sick. . Avoid places or  events with large numbers of people in one location, like concerts or sporting events. . If you have some symptoms but not all symptoms, continue to monitor at home and seek medical attention if your symptoms worsen. . If you are having a medical emergency, call 911.   [place] / e-Visit: [email or web address]         [facility]: [facility]: [id number]                   [facility]: [id number]     It is flu season:   >>> Best ways to protect herself from the flu: Receive the yearly flu vaccine, practice good hand hygiene washing with soap and also using hand sanitizer when available, eat a nutritious meals, get adequate rest, hydrate appropriately   Please contact the office if your symptoms worsen or you have concerns that you are not improving.   Thank you for choosing [facility] for your healthcare, and for allowing [place] to partner with you on your healthcare journey. I am thankful to be able to provide care to you today.   [name] FNP-C

## 2019-11-13 DIAGNOSIS — M25641 Stiffness of right hand, not elsewhere classified: Secondary | ICD-10-CM | POA: Diagnosis not present

## 2019-11-13 DIAGNOSIS — M1811 Unilateral primary osteoarthritis of first carpometacarpal joint, right hand: Secondary | ICD-10-CM | POA: Diagnosis not present

## 2019-11-18 NOTE — Progress Notes (Signed)
'@Patient'  ID: Tammy Mcpherson, adult    DOB: Dec 20, 1955, 64 y.o.   MRN: [id number]  Chief Complaint  Patient presents with  . Follow-up    1 wk f/u. States she is feeling better. Still has a productive cough with clear phlegm with a bitter taste. Only has SOB after a coughing spell.     Referring provider: [name], NP  HPI:  64 year old female current every day smoker followed in our office for COPD  PMH: Hypertension, anemia, hyperlipidemia, gastric AVM, PAD, vitamin D deficiency, vitamin B12 deficiency, prediabetes Smoker/ Smoking History: Current Everyday Smoker.  Smoking 2-3 cigarettes per day.  44-pack-year smoking history Maintenance: Breztri Pt of: Dr. [name]   11/19/2019  - Visit   64 year old female current everyday smoker followed in our office for COPD.  Patient completing 1 week follow-up after being treated as a COPD exacerbation.  Patient continues to work on stopping smoking.  She is using a Nicotrol inhaler.  She has found success by cutting down her smoking by using this.  She has not set a quit date.  Smoking 2 to 3 cigarettes a day.  Overall she reports today that she has been doing better since last being seen.  She has less shortness of breath, less coughing episodes and is starting to feel better.  She still does have occasional cough with some wheezing.  She feels that she is returning back to baseline.  Questionaires / Pulmonary Flowsheets:   MMRC: mMRC Dyspnea Scale mMRC Score  11/19/2019 1  11/12/2019 1    Tests:   10/08/2018-CBC with differential-eosinophils relative 1, eosinophils absolute 0.2  04/30/2016-CT angios chest- cardiomegaly with interstitial pulmonary edema very small bilateral pleural effusions, no pulmonary emboli identified  12/04/2018-CT chest lung cancer screening- biapical pleural-parenchymal scarring, mild centrilobular emphysema, lung RADS 1  03/20/2018-echocardiogram-LV ejection fraction 65 to 73%, grade 1 diastolic dysfunction,  mild LVH  11/01/2018- pulmonary function test-FVC 2.42 (76% predicted), postbronchodilator ratio 74, postbronchodilator FEV1 1.52 (62% predicted), no significant bronchodilator response, DLCO was 51 >>>Patient did take Spiriva prior to testing >>>Patient has not taken Symbicort today  FENO:  No results found for: NITRICOXIDE  PFT: PFT Results Latest Ref Rng & Units 11/01/2018  FVC-Predicted Pre % 76  FVC-Post L 2.07  FVC-Predicted Post % 65  Pre FEV1/FVC % % 65  Post FEV1/FCV % % 74  FEV1-Pre L 1.57  FEV1-Predicted Pre % 64  FEV1-Post L 1.52  DLCO UNC% % 51  DLCO COR %Predicted % 65  TLC L 4.12  TLC % Predicted % 84  RV % Predicted % 90    WALK:  SIX MIN WALK 11/01/2018  Supplimental Oxygen during Test? (L/min) No  Tech Comments: Pt did not desat, however, she could not complete all 3x laps due to SOB, mild dizziness, and mild chest pain.     Imaging: No results found.  Lab Results:  CBC    Component Value Date/Time   WBC 15.4 (H) 09/03/2019 1301   RBC 4.50 09/03/2019 1301   HGB 11.0 (L) 09/03/2019 1301   HGB 11.8 07/02/2019 1539   HCT 37.5 09/03/2019 1301   HCT 35.9 07/02/2019 1539   PLT 628 (H) 09/03/2019 1301   PLT 518 (H) 07/02/2019 1539   MCV 83.3 09/03/2019 1301   MCV 86 07/02/2019 1539   MCH 24.4 (L) 09/03/2019 1301   MCHC 29.3 (L) 09/03/2019 1301   RDW 15.4 09/03/2019 1301   RDW 13.6 07/02/2019 1539   LYMPHSABS 1.8 09/03/2019  1301   LYMPHSABS 1.8 07/02/2019 1539   MONOABS 1.7 (H) 09/03/2019 1301   EOSABS 0.2 09/03/2019 1301   EOSABS 0.1 07/02/2019 1539   BASOSABS 0.1 09/03/2019 1301   BASOSABS 0.1 07/02/2019 1539    BMET    Component Value Date/Time   NA 139 10/13/2019 1431   K 4.5 10/13/2019 1431   CL 104 10/13/2019 1431   CO2 21 10/13/2019 1431   GLUCOSE 201 (H) 10/13/2019 1431   GLUCOSE 238 (H) 09/03/2019 1301   BUN 8 10/13/2019 1431   CREATININE 0.60 10/13/2019 1431   CREATININE 0.73 09/28/2017 1201   CALCIUM 10.2 10/13/2019 1431    GFRNONAA 98 10/13/2019 1431   GFRNONAA 90 09/28/2017 1201   GFRAA 113 10/13/2019 1431   GFRAA 104 09/28/2017 1201    BNP    Component Value Date/Time   BNP 446.0 (H) 04/30/2016 0719    ProBNP No results found for: PROBNP  Specialty Problems      Pulmonary Problems   Chronic obstructive pulmonary disease ([place])    11/01/2018- pulmonary function test-FVC 2.42 (76% predicted), postbronchodilator ratio 74, postbronchodilator FEV1 1.52 (62% predicted), no significant bronchodilator response, DLCO was 51 >>>Patient did take Spiriva prior to testing >>>Patient has not taken Symbicort today  12/04/2018-CT chest lung cancer screening- biapical pleural-parenchymal scarring, mild centrilobular emphysema, lung RADS 1      Dyspnea on effort      Allergies  Allergen Reactions  . Bee Venom Swelling  . Codeine Anaphylaxis    Tongue swelled  . Penicillins Swelling and Rash    Has patient had a PCN reaction causing immediate rash, facial/tongue/throat swelling, SOB or lightheadedness with hypotension: No, delayed Has patient had a PCN reaction causing severe rash involving mucus membranes or skin necrosis: No Has patient had a PCN reaction that required hospitalization No Has patient had a PCN reaction occurring within the last 10 years: No If all of the above answers are "NO", then may proceed with Cephalosporin use.   . Bupropion Other (See Comments)    "Made my skin crawl"  . Sudafed [Pseudoephedrine Hcl] Rash    Immunization History  Administered Date(s) Administered  . Influenza,inj,Quad PF,6+ Mos 07/04/2017, 07/10/2018, 07/02/2019  . Pneumococcal Polysaccharide-23 11/01/2018    Past Medical History:  Diagnosis Date  . Anemia 04/30/2016  . Anxiety   . Blood transfusion without reported diagnosis   . Cardiomyopathy ([place])    a. EF 40-45% by echo in 04/2016 b. Improved to 60-65% by repeat imaging in 2018  . CHF (congestive heart failure) ([place])   . COPD (chronic obstructive  pulmonary disease) ([place])   . Coronary artery calcification seen on CT scan   . Essential hypertension   . GERD (gastroesophageal reflux disease)   . History of bronchitis   . History of GI bleed   . Hyperlipidemia   . Iron deficiency anemia 05/12/2016   Bleeding ulcers  . Pollen allergy   . PSVT (paroxysmal supraventricular tachycardia) ([place])   . PVC's (premature ventricular contractions)     Tobacco History: Social History   Tobacco Use  Smoking Status Current Every Day Smoker  . Packs/day: 1.00  . Years: 44.00  . Pack years: 44.00  . Types: Cigarettes  . Start date: 05/10/1975  Smokeless Tobacco Never Used  Tobacco Comment   pack per day 1.15.20   Ready to quit: Yes Counseling given: Yes Comment: pack per day 1.15.20  Patient continuing to work on stopping smoking.  She is using her  Nicotrol Inhaler.  We have recommended for her to set a quit date.  We emphasized the importance of her stopping smoking.  Outpatient Encounter Medications as of 11/19/2019  Medication Sig  . acetaminophen (TYLENOL) 500 MG tablet Take 500 mg by mouth at bedtime as needed for moderate pain.  Marland Kitchen albuterol (PROVENTIL) (2.5 MG/3ML) 0.083% nebulizer solution Take 3 mLs (2.5 mg total) by nebulization every 4 (four) hours as needed for wheezing or shortness of breath.  Marland Kitchen aspirin 81 MG chewable tablet Chew 81 mg by mouth every morning.   Marland Kitchen atorvastatin (LIPITOR) 20 MG tablet TAKE ONE TABLET (20MG TOTAL) BY MOUTH DAILY (Patient taking differently: Take 20 mg by mouth every morning. )  . bisoprolol (ZEBETA) 10 MG tablet TAKE ONE (1) TABLET BY MOUTH EVERY DAY  . Blood Glucose Monitoring Suppl (ONE TOUCH ULTRA 2) w/Device KIT   . Budeson-Glycopyrrol-Formoterol (BREZTRI AEROSPHERE) 160-9-4.8 MCG/ACT AERO Inhale 2 puffs into the lungs 2 (two) times daily.  . Continuous Blood Gluc Receiver (FREESTYLE LIBRE 14 DAY READER) DEVI See admin instructions.  . Continuous Blood Gluc Sensor (FREESTYLE LIBRE SENSOR  SYSTEM) MISC Use as directed  . cyanocobalamin (,VITAMIN B-12,) 1000 MCG/ML injection Inject 1 mL (1,000 mcg total) into the muscle every 30 (thirty) days.  Marland Kitchen gabapentin (NEURONTIN) 300 MG capsule Take 1 capsule (300 mg total) by mouth at bedtime.  Marland Kitchen glucose blood (ONETOUCH ULTRA) test strip Use as instructed  . glucose monitoring kit (FREESTYLE) monitoring kit Use to check blood sugar BID as directed  . Lancets (ONETOUCH DELICA PLUS [id number]) MISC 1 each by Does not apply route 2 (two) times a day.  . losartan (COZAAR) 100 MG tablet TAKE ONE (1) TABLET BY MOUTH EVERY DAY  . metFORMIN (GLUCOPHAGE) 1000 MG tablet Take 1 tablet (1,000 mg total) by mouth 2 (two) times daily with a meal.  . Misc. Devices MISC Please provide supplies (needle, syringe, alcohol swabs) needed for patient to self-administer B-12 injections monthly.  . nicotine polacrilex (COMMIT) 4 MG lozenge Take 1 lozenge (4 mg total) by mouth as needed for smoking cessation.  . pantoprazole (PROTONIX) 40 MG tablet TAKE ONE (1) TABLET BY MOUTH EVERY DAY (Patient taking differently: Take 40 mg by mouth every morning. TAKE ONE (1) TABLET BY MOUTH EVERY DAY)  . [DISCONTINUED] Budeson-Glycopyrrol-Formoterol (BREZTRI AEROSPHERE) 160-9-4.8 MCG/ACT AERO Inhale 2 puffs into the lungs 2 (two) times daily.  . Budeson-Glycopyrrol-Formoterol (BREZTRI AEROSPHERE) 160-9-4.8 MCG/ACT AERO Inhale 2 puffs into the lungs 2 (two) times daily.  . [DISCONTINUED] azithromycin (ZITHROMAX) 250 MG tablet 538m (two tablets) today, then 2563m(1 tablet) for the next 4 days   No facility-administered encounter medications on file as of 11/19/2019.     Review of Systems  Review of Systems  Constitutional: Positive for activity change. Negative for chills, fatigue, fever and unexpected weight change.  HENT: Negative for postnasal drip, rhinorrhea, sinus pressure, sinus pain and sore throat.   Eyes: Negative.   Respiratory: Positive for cough. Negative for  shortness of breath and wheezing.   Cardiovascular: Negative for chest pain and palpitations.  Endocrine: Negative.   Genitourinary: Negative.   Musculoskeletal: Negative.   Skin: Negative.   Neurological: Negative for dizziness and headaches.  Psychiatric/Behavioral: Negative.  Negative for dysphoric mood. The patient is not nervous/anxious.   All other systems reviewed and are negative.    Physical Exam  BP 126/80 (BP Location: Left Arm, Patient Position: Sitting, Cuff Size: Normal)   Pulse 81   Temp (!Marland Kitchen  97.1 F (36.2 C) (Temporal)   Ht '5\' 4"'  (1.626 m)   Wt 157 lb (71.2 kg)   SpO2 95% Comment: on RA  BMI 26.95 kg/m   Wt Readings from Last 5 Encounters:  11/19/19 157 lb (71.2 kg)  11/12/19 156 lb (70.8 kg)  10/27/19 140 lb (63.5 kg)  10/15/19 140 lb (63.5 kg)  08/19/19 155 lb (70.3 kg)    BMI Readings from Last 5 Encounters:  11/19/19 26.95 kg/m  11/12/19 26.78 kg/m  10/27/19 24.03 kg/m  10/15/19 24.03 kg/m  08/19/19 26.61 kg/m     Physical Exam Vitals and nursing note reviewed.  Constitutional:      General: She is not in acute distress.    Appearance: She is normal weight.  HENT:     Head: Normocephalic and atraumatic.     Right Ear: External ear normal.     Left Ear: External ear normal.     Nose: Nose normal.     Mouth/Throat:     Mouth: Mucous membranes are moist.     Pharynx: Oropharynx is clear.  Eyes:     Pupils: Pupils are equal, round, and reactive to light.  Cardiovascular:     Rate and Rhythm: Normal rate and regular rhythm.     Pulses: Normal pulses.     Heart sounds: Normal heart sounds. No murmur.  Pulmonary:     Effort: Pulmonary effort is normal.     Breath sounds: Normal breath sounds. No decreased air movement. No decreased breath sounds, wheezing or rales.  Musculoskeletal:     Cervical back: Normal range of motion.  Skin:    General: Skin is warm and dry.     Capillary Refill: Capillary refill takes less than 2 seconds.   Neurological:     General: No focal deficit present.     Mental Status: She is alert and oriented to person, place, and time. Mental status is at baseline.     Gait: Gait normal.  Psychiatric:        Mood and Affect: Mood normal.        Behavior: Behavior normal.        Thought Content: Thought content normal.        Judgment: Judgment normal.       Assessment & Plan:   Chronic obstructive pulmonary disease ([place]) Plan:  Continue Breztri  Follow-up with our office in 3 weeks Emphasized importance of the patient to stop smoking  Type 2 diabetes mellitus ([place]) Plan: Patient needs to follow-up with primary care regarding her blood sugars I personally contacted patient's PCP, Tammy Coss, NP reports that he will coordinate getting the patient seen in office within the week.  Unfortunately the patient had an office visit today which she had canceled.  Tobacco abuse Praised patient for her success so far by reducing her smoking  Plan: Continue Nicotrol Inhaler Repeat lung cancer screening CT in March/2021 Follow-up in 3 week    Return in about 3 weeks (around 12/10/2019), or if symptoms worsen or fail to improve, for Follow up with [name] FNP-C, Follow up with Dr. [name].   Lauraine Rinne, NP 11/19/2019   This appointment required 24 minutes of patient care (this includes precharting, chart review, review of results, face-to-face care, etc.).

## 2019-11-19 ENCOUNTER — Encounter: Payer: Self-pay | Admitting: Pulmonary Disease

## 2019-11-19 ENCOUNTER — Ambulatory Visit: Payer: PPO | Admitting: Registered Nurse

## 2019-11-19 ENCOUNTER — Ambulatory Visit: Payer: PPO | Admitting: Pulmonary Disease

## 2019-11-19 ENCOUNTER — Other Ambulatory Visit: Payer: Self-pay

## 2019-11-19 VITALS — BP 126/80 | HR 81 | Temp 97.1°F | Ht 64.0 in | Wt 157.0 lb

## 2019-11-19 DIAGNOSIS — Z720 Tobacco use: Secondary | ICD-10-CM | POA: Diagnosis not present

## 2019-11-19 DIAGNOSIS — J449 Chronic obstructive pulmonary disease, unspecified: Secondary | ICD-10-CM | POA: Diagnosis not present

## 2019-11-19 DIAGNOSIS — E1169 Type 2 diabetes mellitus with other specified complication: Secondary | ICD-10-CM | POA: Diagnosis not present

## 2019-11-19 MED ORDER — BREZTRI AEROSPHERE 160-9-4.8 MCG/ACT IN AERO: 2.0000 | INHALATION_SPRAY | Freq: Two times a day (BID) | RESPIRATORY_TRACT | 0 refills | Status: DC

## 2019-11-19 MED ORDER — BREZTRI AEROSPHERE 160-9-4.8 MCG/ACT IN AERO: 2.0000 | INHALATION_SPRAY | Freq: Two times a day (BID) | RESPIRATORY_TRACT | 5 refills | Status: DC

## 2019-11-19 NOTE — Assessment & Plan Note (Signed)
Plan:  Continue Breztri  Follow-up with our office in 3 weeks Emphasized importance of the patient to stop smoking

## 2019-11-19 NOTE — Patient Instructions (Addendum)
You were seen today by Lauraine Rinne, NP  for:   1. Chronic obstructive pulmonary disease, unspecified COPD type ([place])  Breztri >>> 2 puffs in the morning right when you wake up, rinse out your mouth after use, 12 hours later 2 puffs, rinse after use >>> Take this daily, no matter what >>> This is not a rescue inhaler   Only use your albuterol as a rescue medication to be used if you can't catch your breath by resting or doing a relaxed purse lip breathing pattern.  - The less you use it, the better it will work when you need it. - Ok to use up to 2 puffs  every 4 hours if you must but call for immediate appointment if use goes up over your usual need - Don't leave home without it !!  (think of it like the spare tire for your car)   Note your daily symptoms > remember "red flags" for COPD:   >>>Increase in cough >>>increase in sputum production >>>increase in shortness of breath or activity  intolerance.   If you notice these symptoms, please call the office to be seen.   Follow-up with our office in 2 to 3 weeks  2. Tobacco abuse  Continue to work on stopping smoking  Continue Nicotrol inhaler use  We recommend that you stop smoking.  >>>You need to set a quit date >>>If you have friends or family who smoke, let them know you are trying to quit and not to smoke around you or in your living environment  Smoking Cessation Resources:  1 800 QUIT NOW  >>> Patient to call this resource and utilize it to help support her quit smoking >>> Keep up your hard work with stopping smoking  You can also contact the [facility] >>>For smoking cessation classes call [phone]  We do not recommend using e-cigarettes as a form of stopping smoking  You can sign up for smoking cessation support texts and information:  >>>https://smokefree.gov/smokefreetxt  3. Type 2 diabetes mellitus with other specified complication, unspecified whether long term insulin use ([place])   Contact your primary care office and schedule an in person office visit to further evaluate your blood sugar levels   We recommend today:   Meds ordered this encounter  Medications  . Budeson-Glycopyrrol-Formoterol (BREZTRI AEROSPHERE) 160-9-4.8 MCG/ACT AERO    Sig: Inhale 2 puffs into the lungs 2 (two) times daily.    Dispense:  11.8 g    Refill:  0    Order Specific Question:   Lot Number?    Answer:   [id number]    Order Specific Question:   Expiration Date?    Answer:   05/01/2021    Order Specific Question:   Manufacturer?    Answer:   [employer] [[id number]]  . Budeson-Glycopyrrol-Formoterol (BREZTRI AEROSPHERE) 160-9-4.8 MCG/ACT AERO    Sig: Inhale 2 puffs into the lungs 2 (two) times daily.    Dispense:  10.7 g    Refill:  5    Order Specific Question:   Lot Number?    Answer:   [id number]    Order Specific Question:   Manufacturer?    Answer:   [employer] [[id number]]    Follow Up:    Return in about 3 weeks (around 12/10/2019), or if symptoms worsen or fail to improve, for Follow up with [name] FNP-C, Follow up with Dr. [name].   Please do your part to reduce the spread of COVID-19:  Reduce your risk of any infection  and COVID19 by using the similar precautions used for avoiding the common cold or flu:  Marland Kitchen Wash your hands often with soap and warm water for at least 20 seconds.  If soap and water are not readily available, use an alcohol-based hand sanitizer with at least 60% alcohol.  . If coughing or sneezing, cover your mouth and nose by coughing or sneezing into the elbow areas of your shirt or coat, into a tissue or into your sleeve (not your hands). Langley Gauss A MASK when in public  . Avoid shaking hands with others and consider head nods or verbal greetings only. . Avoid touching your eyes, nose, or mouth with unwashed hands.  . Avoid close contact with people who are sick. . Avoid places or events with large numbers of people in one location, like  concerts or sporting events. . If you have some symptoms but not all symptoms, continue to monitor at home and seek medical attention if your symptoms worsen. . If you are having a medical emergency, call 911.   [place] / e-Visit: [email or web address]         [facility]: [facility]: [id number]                   [facility]: [id number]     It is flu season:   >>> Best ways to protect herself from the flu: Receive the yearly flu vaccine, practice good hand hygiene washing with soap and also using hand sanitizer when available, eat a nutritious meals, get adequate rest, hydrate appropriately   Please contact the office if your symptoms worsen or you have concerns that you are not improving.   Thank you for choosing [facility] for your healthcare, and for allowing [place] to partner with you on your healthcare journey. I am thankful to be able to provide care to you today.   [name] FNP-C

## 2019-11-19 NOTE — Assessment & Plan Note (Signed)
Plan: Patient needs to follow-up with primary care regarding her blood sugars I personally contacted patient's PCP, [name], NP reports that he will coordinate getting the patient seen in office within the week.  Unfortunately the patient had an office visit today which she had canceled.

## 2019-11-19 NOTE — Assessment & Plan Note (Signed)
Praised patient for her success so far by reducing her smoking  Plan: Continue Nicotrol Inhaler Repeat lung cancer screening CT in March/2021 Follow-up in 3 week

## 2019-11-21 ENCOUNTER — Telehealth (INDEPENDENT_AMBULATORY_CARE_PROVIDER_SITE_OTHER): Payer: PPO | Admitting: Registered Nurse

## 2019-11-21 ENCOUNTER — Ambulatory Visit: Payer: PPO | Admitting: Registered Nurse

## 2019-11-21 ENCOUNTER — Other Ambulatory Visit: Payer: Self-pay

## 2019-11-21 VITALS — BP 126/85 | Wt 155.0 lb

## 2019-11-21 DIAGNOSIS — E1142 Type 2 diabetes mellitus with diabetic polyneuropathy: Secondary | ICD-10-CM | POA: Diagnosis not present

## 2019-11-21 NOTE — Progress Notes (Signed)
PCCM: Thanks for seeing her.  [facility] 11/21/2019 9:46 AM

## 2019-11-21 NOTE — Progress Notes (Signed)
Telemedicine Encounter- SOAP NOTE Established Patient  This telephone encounter was conducted with the patient's (or proxy's) verbal consent via audio telecommunications: yes Patient was instructed to have this encounter in a suitably private space; and to only have persons present to whom they give permission to participate. In addition, patient identity was confirmed by use of name plus two identifiers (DOB and address).  I discussed the limitations, risks, security and privacy concerns of performing an evaluation and management service by telephone and the availability of in person appointments. I also discussed with the patient that there may be a patient responsible charge related to this service. The patient expressed understanding and agreed to proceed.  I spent a total of TIME; 0 MIN TO 60 MIN: 25 minutes talking with the patient or their proxy.  No chief complaint on file.   Subjective   Tammy Mcpherson is a 64 y.o. established patient. Telephone visit today for labile blood sugars.  HPI Pt had dramatically improved A1c at 7.0 in office. We suggested cessation of metformin and attempt at dietary control at that time. However, since then, pt reports blood sugars have become extremely labile with lows of 39 at waking and highs of 386 2-4 hours postprandial. Feels symptomatic with lows, not as much with elevations, but is concerned for the effects it may have on her chronic conditions. Additionally, had hand surgery a number of months ago, feels that her healing is delayed due to her blood sugars.  She is consistent with blood sugar monitoring and has restarted her metformin 5107m PO bid. Suggest she only take this mornings to avoid nocturnal hypoglycemia.  Patient Active Problem List   Diagnosis Date Noted  . Type 2 diabetes mellitus ([place] 11/12/2019  . Healthcare maintenance 11/03/2019  . Ganglion cyst of volar aspect of right wrist 05/23/2019  . Neuropathy due to type 2  diabetes mellitus ([facility] 04/22/2019  . Need for hepatitis C screening test 11/20/2018  . Prediabetes 03/01/2018  . Diarrhea 02/07/2018  . Lesion of liver   . Gastric AVM   . AVM (arteriovenous malformation) of small bowel, acquired   . Vitamin D deficiency 01/15/2017  . PAD (peripheral artery disease) ([facility] 01/15/2017  . Aortic atherosclerosis ([facility] 01/01/2017  . Gastric ulcer 01/01/2017  . Hyperlipidemia 01/01/2017  . Dyspnea on effort 01/01/2017  . Anemia, iron deficiency 05/12/2016  . Nonischemic cardiomyopathy ([facility] 05/02/2016  . CAD-Ca++ coronaries on CTA 05/02/2016  . Acute combined systolic and diastolic heart failure ([facility] 04/30/2016  . Chronic obstructive pulmonary disease ([facility] 04/30/2016  . Essential hypertension 04/30/2016  . Tobacco abuse 04/30/2016    Past Medical History:  Diagnosis Date  . Anemia 04/30/2016  . Anxiety   . Blood transfusion without reported diagnosis   . Cardiomyopathy ([facility]    a. EF 40-45% by echo in 04/2016 b. Improved to 60-65% by repeat imaging in 2018  . CHF (congestive heart failure) ([place]   . COPD (chronic obstructive pulmonary disease) ([facility]   . Coronary artery calcification seen on CT scan   . Essential hypertension   . GERD (gastroesophageal reflux disease)   . History of bronchitis   . History of GI bleed   . Hyperlipidemia   . Iron deficiency anemia 05/12/2016   Bleeding ulcers  . Pollen allergy   . PSVT (paroxysmal supraventricular tachycardia) ([facility]   . PVC's (premature ventricular contractions)     Current Outpatient Medications  Medication Sig Dispense Refill  . acetaminophen (TYLENOL) 500 MG  tablet Take 500 mg by mouth at bedtime as needed for moderate pain.    Marland Kitchen albuterol (PROVENTIL) (2.5 MG/3ML) 0.083% nebulizer solution Take 3 mLs (2.5 mg total) by nebulization every 4 (four) hours as needed for wheezing or shortness of breath. 75 mL 5  . aspirin 81 MG chewable tablet Chew 81 mg by mouth every morning.     Marland Kitchen  atorvastatin (LIPITOR) 20 MG tablet TAKE ONE TABLET (20MG TOTAL) BY MOUTH DAILY (Patient taking differently: Take 20 mg by mouth every morning. ) 90 tablet 3  . bisoprolol (ZEBETA) 10 MG tablet TAKE ONE (1) TABLET BY MOUTH EVERY DAY 90 tablet 3  . Blood Glucose Monitoring Suppl (ONE TOUCH ULTRA 2) w/Device KIT     . Budeson-Glycopyrrol-Formoterol (BREZTRI AEROSPHERE) 160-9-4.8 MCG/ACT AERO Inhale 2 puffs into the lungs 2 (two) times daily. 11.8 g 0  . Budeson-Glycopyrrol-Formoterol (BREZTRI AEROSPHERE) 160-9-4.8 MCG/ACT AERO Inhale 2 puffs into the lungs 2 (two) times daily. 10.7 g 5  . Continuous Blood Gluc Receiver (FREESTYLE LIBRE 14 DAY READER) DEVI See admin instructions.    . Continuous Blood Gluc Sensor (FREESTYLE LIBRE SENSOR SYSTEM) MISC Use as directed 1 each 0  . cyanocobalamin (,VITAMIN B-12,) 1000 MCG/ML injection Inject 1 mL (1,000 mcg total) into the muscle every 30 (thirty) days. 1 mL 11  . gabapentin (NEURONTIN) 300 MG capsule Take 1 capsule (300 mg total) by mouth at bedtime. 90 capsule 3  . glucose blood (ONETOUCH ULTRA) test strip Use as instructed 100 each 12  . glucose monitoring kit (FREESTYLE) monitoring kit Use to check blood sugar BID as directed 1 each 0  . Lancets (ONETOUCH DELICA PLUS [id number]) MISC 1 each by Does not apply route 2 (two) times a day. 100 each 3  . losartan (COZAAR) 100 MG tablet TAKE ONE (1) TABLET BY MOUTH EVERY DAY 90 tablet 3  . metFORMIN (GLUCOPHAGE) 1000 MG tablet Take 1 tablet (1,000 mg total) by mouth 2 (two) times daily with a meal. 180 tablet 3  . Misc. Devices MISC Please provide supplies (needle, syringe, alcohol swabs) needed for patient to self-administer B-12 injections monthly. 1 each 12  . nicotine polacrilex (COMMIT) 4 MG lozenge Take 1 lozenge (4 mg total) by mouth as needed for smoking cessation. 100 tablet 3  . pantoprazole (PROTONIX) 40 MG tablet TAKE ONE (1) TABLET BY MOUTH EVERY DAY (Patient taking differently: Take 40 mg by mouth  every morning. TAKE ONE (1) TABLET BY MOUTH EVERY DAY) 90 tablet 3   No current facility-administered medications for this visit.    Allergies  Allergen Reactions  . Bee Venom Swelling  . Codeine Anaphylaxis    Tongue swelled  . Penicillins Swelling and Rash    Has patient had a PCN reaction causing immediate rash, facial/tongue/throat swelling, SOB or lightheadedness with hypotension: No, delayed Has patient had a PCN reaction causing severe rash involving mucus membranes or skin necrosis: No Has patient had a PCN reaction that required hospitalization No Has patient had a PCN reaction occurring within the last 10 years: No If all of the above answers are "NO", then may proceed with Cephalosporin use.   . Bupropion Other (See Comments)    "Made my skin crawl"  . Sudafed [Pseudoephedrine Hcl] Rash    Social History   Socioeconomic History  . Marital status: Married    Spouse name: [name]  . Number of children: 1  . Years of education: 42  . Highest education level: Not on file  Occupational History  . Occupation: disabled    Comment: heart  Tobacco Use  . Smoking status: Current Every Day Smoker    Packs/day: 1.00    Years: 44.00    Pack years: 44.00    Types: Cigarettes    Start date: 05/10/1975  . Smokeless tobacco: Never Used  . Tobacco comment: pack per day 1.15.20  Substance and Sexual Activity  . Alcohol use: Yes    Alcohol/week: 2.0 standard drinks    Types: 2 Glasses of wine per week  . Drug use: No  . Sexual activity: Yes    Birth control/protection: Surgical  Other Topics Concern  . Not on file  Social History Narrative   Disabled   Lives with husband [name]   Two dogs   Social Determinants of Health   Financial Resource Strain:   . Difficulty of Paying Living Expenses: Not on file  Food Insecurity:   . Worried About Charity fundraiser in the Last Year: Not on file  . Ran Out of Food in the Last Year: Not on file  Transportation Needs:   . Lack of  Transportation (Medical): Not on file  . Lack of Transportation (Non-Medical): Not on file  Physical Activity:   . Days of Exercise per Week: Not on file  . Minutes of Exercise per Session: Not on file  Stress:   . Feeling of Stress : Not on file  Social Connections:   . Frequency of Communication with Friends and Family: Not on file  . Frequency of Social Gatherings with Friends and Family: Not on file  . Attends Religious Services: Not on file  . Active Member of Clubs or Organizations: Not on file  . Attends Archivist Meetings: Not on file  . Marital Status: Not on file  Intimate Partner Violence:   . Fear of Current or Ex-Partner: Not on file  . Emotionally Abused: Not on file  . Physically Abused: Not on file  . Sexually Abused: Not on file    ROS Per hpi   Objective   Vitals as reported by the patient: Today's Vitals   11/21/19 0935  BP: 126/85  Weight: 155 lb (70.3 kg)    Diagnoses and all orders for this visit:  Controlled type 2 diabetes mellitus with diabetic polyneuropathy, without long-term current use of insulin ([place]) -     Ambulatory referral to Endocrinology   PLAN  Given her sugar lability and the concerning low of 39 - she needs a consult with endocrinology at this time. She has not been seen by endo before.   Given her CHF and other cardiac concerns, elevated glucose is also of concern. While she is asymptomatic with hyperglycemia at this time, I worry about the adverse effects should it rise into 400s-500s.  Discussed plan with patient who demonstrated understanding.  Patient encouraged to call clinic with any questions, comments, or concerns.   I discussed the assessment and treatment plan with the patient. The patient was provided an opportunity to ask questions and all were answered. The patient agreed with the plan and demonstrated an understanding of the instructions.   The patient was advised to call back or seek an in-person  evaluation if the symptoms worsen or if the condition fails to improve as anticipated.  I provided 25 minutes of non-face-to-face time during this encounter.  [name], NP  Primary Care at [facility]

## 2019-11-21 NOTE — Progress Notes (Signed)
Patient states you have trouble with diabetes states her level readings keeps going up, and coming down very low within the last two weeks , and would like to discuss this with provider. Patient had hand surgery and hand is not healing properly thinks its because of her sugar levels.

## 2019-11-22 ENCOUNTER — Encounter: Payer: Self-pay | Admitting: Registered Nurse

## 2019-11-24 ENCOUNTER — Other Ambulatory Visit: Payer: Self-pay | Admitting: Registered Nurse

## 2019-11-24 DIAGNOSIS — E1140 Type 2 diabetes mellitus with diabetic neuropathy, unspecified: Secondary | ICD-10-CM

## 2019-11-24 MED ORDER — GABAPENTIN 300 MG PO CAPS: 300.0000 mg | ORAL_CAPSULE | Freq: Every day | ORAL | 3 refills | Status: DC

## 2019-11-24 NOTE — Telephone Encounter (Signed)
Patient would like a refill on Gabapentin due to increasing her dosage after hand surgery.  Please Advise.

## 2019-11-27 ENCOUNTER — Ambulatory Visit (INDEPENDENT_AMBULATORY_CARE_PROVIDER_SITE_OTHER): Payer: PPO | Admitting: Gastroenterology

## 2019-11-27 ENCOUNTER — Encounter: Payer: Self-pay | Admitting: Gastroenterology

## 2019-11-27 DIAGNOSIS — K31819 Angiodysplasia of stomach and duodenum without bleeding: Secondary | ICD-10-CM | POA: Diagnosis not present

## 2019-11-27 DIAGNOSIS — K279 Peptic ulcer, site unspecified, unspecified as acute or chronic, without hemorrhage or perforation: Secondary | ICD-10-CM | POA: Diagnosis not present

## 2019-11-27 DIAGNOSIS — M1811 Unilateral primary osteoarthritis of first carpometacarpal joint, right hand: Secondary | ICD-10-CM | POA: Diagnosis not present

## 2019-11-27 DIAGNOSIS — M25641 Stiffness of right hand, not elsewhere classified: Secondary | ICD-10-CM | POA: Diagnosis not present

## 2019-11-27 MED ORDER — PANTOPRAZOLE SODIUM 40 MG PO TBEC: DELAYED_RELEASE_TABLET | ORAL | 3 refills | Status: DC

## 2019-11-27 NOTE — Progress Notes (Signed)
Cc'ed to pcp °

## 2019-11-27 NOTE — Progress Notes (Signed)
Subjective:    [name], adult    DOB: [birth year], 64 y.o.   MRN: [id number]  [name], NP   Primary Care Physician:  [name], NP  Primary GI:  Barney Drain, MD   Patient Location: home   Provider Location: [facility] office   Reason for Visit: DIARRHEA, REFILLS   Persons present on the virtual encounter, with roles: patient, myself (provider), [name] (update meds/allergies)   Total time (minutes) spent on medical discussion:  MINUTES   Due to COVID-19, visit was VIA TELEPHONE VISIT DUE TO COVID 19. VISIT IS CONDUCTED VIRTUALLY AND WAS REQUESTED BY PATIENT.   Virtual Visit via TELEPHONE   I connected with Tammy Mcpherson and verified that I am speaking with the correct person using two identifiers.   I discussed the limitations, risks, security and privacy concerns of performing an evaluation and management service by telephone/video and the availability of in person appointments. I also discussed with the patient that there may be a patient responsible charge related to this service. The patient expressed understanding and agreed to proceed.  HPI DIARRHEA BETTER AFTER CUTTING OUT MILK AND GREASY FOODS AND USING LACTASE OR LACTAID. BMs: 1X/DAY-NL. NO HEARTBURN.  PT DENIES FEVER, CHILLS, HEMATOCHEZIA, HEMATEMESIS, nausea, vomiting, melena, diarrhea, CHEST PAIN, SHORTNESS OF BREATH, CHANGE IN BOWEL IN HABITS, constipation, abdominal pain, problems swallowing, OR heartburn or indigestion.  Past Medical History:  Diagnosis Date  . Anemia 04/30/2016  . Anxiety   . Blood transfusion without reported diagnosis   . Cardiomyopathy ([place])    a. EF 40-45% by echo in 04/2016 b. Improved to 60-65% by repeat imaging in 2018  . CHF (congestive heart failure) ([place])   . COPD (chronic obstructive pulmonary disease) ([place])   . Coronary artery calcification seen on CT scan   . Essential hypertension   . GERD (gastroesophageal reflux disease)   . History of  bronchitis   . History of GI bleed   . Hyperlipidemia   . Iron deficiency anemia 05/12/2016   Bleeding ulcers  . Pollen allergy   . PSVT (paroxysmal supraventricular tachycardia) ([place])   . PVC's (premature ventricular contractions)    Past Surgical History:  Procedure Laterality Date  . ABDOMINAL HYSTERECTOMY     polyps  about age 44  . Barbie Banner OSTEOTOMY Right 07/10/2013   Procedure: Barbie Banner OSTEOTOMY RIGHT FOOT;  Surgeon: Marcheta Grammes, DPM;  Location: [facility];  Service: Orthopedics;  Laterality: Right;  . AMPUTATION Left 05/09/2017   Procedure: AMPUTATION 5TH TOE LEFT FOOT;  Surgeon: [name], DPM;  Location: [facility];  Service: Podiatry;  Laterality: Left;  . AMPUTATION Left 06/13/2017   Procedure: AMPUTATION 2ND TOE LEFT FOOT;  Surgeon: [name], DPM;  Location: [facility];  Service: Podiatry;  Laterality: Left;  . BUNIONECTOMY Right 07/10/2013   Procedure: VOGLER BUNIONECTOMY RIGHT FOOT;  Surgeon: Marcheta Grammes, DPM;  Location: [facility];  Service: Orthopedics;  Laterality: Right;  . CHOLECYSTECTOMY N/A 08/22/2017   Procedure: LAPAROSCOPIC CHOLECYSTECTOMY;  Surgeon: Virl Cagey, MD;  Location: [facility];  Service: General;  Laterality: N/A;  . COLONOSCOPY N/A 07/07/2016   Dr. Oneida Alar; redundant left colon, diverticulosis at hepatic flexure, non-bleeding internal hemorrhoids  . ESOPHAGOGASTRODUODENOSCOPY N/A 07/07/2016   Dr. Oneida Alar: many non-bleeding cratered gastric ulcers without stigmata of bleeding in gastric antrum. four 2-3 mm angioectasias without bleeding in duodenal bulb and second portion of duodenum s/p APC. Chroni gastritis on path.   . ESOPHAGOGASTRODUODENOSCOPY  N/A 08/03/2017   Dr. Oneida Alar: erosive gastritis, AVMs. Found a single non-bleeding angioectasia in stomach, s/p APC therapy. Four non-bleeding angioectasias in duodenum s/p APC. Non-bleeding erosive gastropathy  . IR ANGIOGRAM EXTREMITY LEFT  04/24/2017  . IR FEM POP ART ATHERECT INC PTA MOD SED   04/24/2017  . IR INFUSION THROMBOL ARTERIAL INITIAL (MS)  04/24/2017  . IR RADIOLOGIST EVAL & MGMT  12/05/2016  . IR US GUIDE VASC ACCESS RIGHT  04/24/2017  . LIVER BIOPSY N/A 08/22/2017   Procedure: LIVER BIOPSY;  Surgeon: Virl Cagey, MD;  Location: [facility];  Service: General;  Laterality: N/A;  . METATARSAL HEAD EXCISION Right 07/10/2013   Procedure: METATARSAL HEAD RESECTION OF DIGITS 2 AND 3 RIGHT FOOT;  Surgeon: Marcheta Grammes, DPM;  Location: [facility];  Service: Orthopedics;  Laterality: Right;  . PROXIMAL INTERPHALANGEAL FUSION (PIP) Right 07/10/2013   Procedure: ARTHRODESIS PIPJ  2ND DIGIT RIGHT FOOT;  Surgeon: Marcheta Grammes, DPM;  Location: [facility];  Service: Orthopedics;  Laterality: Right;   Allergies  Allergen Reactions  . Bee Venom Swelling  . Codeine Anaphylaxis    Tongue swelled  . Penicillins Swelling and Rash    Has patient had a PCN reaction causing immediate rash, facial/tongue/throat swelling, SOB or lightheadedness with hypotension: No, delayed Has patient had a PCN reaction causing severe rash involving mucus membranes or skin necrosis: No Has patient had a PCN reaction that required hospitalization No Has patient had a PCN reaction occurring within the last 10 years: No If all of the above answers are "NO", then may proceed with Cephalosporin use.   . Bupropion Other (See Comments)    "Made my skin crawl"  . Sudafed [Pseudoephedrine Hcl] Rash    Current Outpatient Medications  Medication Sig    . acetaminophen (TYLENOL) 500 MG tablet Take 500 mg by mouth at bedtime as needed for moderate pain.    Marland Kitchen albuterol (PROVENTIL) (2.5 MG/3ML) 0.083% nebulizer solution Take 3 mLs (2.5 mg total) by nebulization every 4 (four) hours as needed for wheezing or shortness of breath.    Marland Kitchen aspirin 81 MG chewable tablet Chew 81 mg by mouth every morning.     Marland Kitchen atorvastatin (LIPITOR) 20 MG tablet Take 20 mg by mouth every morning.     . bisoprolol (ZEBETA) 10 MG  tablet TAKE ONE (1) TABLET BY MOUTH EVERY DAY    . Blood Glucose Monitoring Suppl (ONE TOUCH ULTRA 2) w/Device KIT     . Budeson-Glycopyrrol-Formoterol (BREZTRI AEROSPHERE) 160-9-4.8 MCG/ACT AERO Inhale 2 puffs into the lungs 2 (two) times daily.    . Continuous Blood Gluc Receiver (FREESTYLE LIBRE 14 DAY READER) DEVI See admin instructions.    . Continuous Blood Gluc Sensor (FREESTYLE LIBRE SENSOR SYSTEM) MISC Use as directed    . cyanocobalamin (,VITAMIN B-12,) 1000 MCG/ML injection Inject 1 mL (1,000 mcg total) into the muscle every 30 (thirty) days.    Marland Kitchen gabapentin (NEURONTIN) 300 MG capsule Take 1 capsule (300 mg total) by mouth at bedtime. (Patient taking differently: Take 300 mg by mouth 2 (two) times daily. )    . glucose blood (ONETOUCH ULTRA) test strip Use as instructed    . glucose monitoring kit (FREESTYLE) monitoring kit Use to check blood sugar BID as directed    . Lancets (ONETOUCH DELICA PLUS [id number]) MISC 1 each by Does not apply route 2 (two) times a day.    . losartan (COZAAR) 100 MG tablet TAKE ONE (1) TABLET BY  MOUTH EVERY DAY    . metFORMIN (GLUCOPHAGE) 1000 MG tablet Take 1 tablet (1,000 mg total) by mouth 2 (two) times daily with a meal.    . Misc. Devices MISC Please provide supplies (needle, syringe, alcohol swabs) needed for patient to self-administer B-12 injections monthly.    . nicotine polacrilex (COMMIT) 4 MG lozenge Take 1 lozenge (4 mg total) by mouth as needed for smoking cessation.    . pantoprazole (PROTONIX) 40 MG tablet TAKE ONE (1) TABLET BY MOUTH EVERY DAY)     Review of Systems PER HPI OTHERWISE ALL SYSTEMS ARE NEGATIVE.    Objective:   Physical Exam TELEPHONE VISIT DUE TO COVID 19, VISIT IS CONDUCTED VIRTUALLY AND WAS REQUESTED BY PATIENT.    Assessment & Plan:

## 2019-12-02 ENCOUNTER — Encounter: Payer: Self-pay | Admitting: Gastroenterology

## 2019-12-08 NOTE — Progress Notes (Signed)
'@Patient'  ID: Tammy Mcpherson, adult    DOB: 07-09-1956, 64 y.o.   MRN: [id number]  Chief Complaint  Patient presents with  . Follow-up    COPD, current smoker     Referring provider: [name], NP  HPI:  64 year old female current every day smoker followed in our office for COPD  PMH: Hypertension, anemia, hyperlipidemia, gastric AVM, PAD, vitamin D deficiency, vitamin B12 deficiency, prediabetes Smoker/ Smoking History: Current Everyday Smoker.  Smoking 2-3 cigarettes per day.  44-pack-year smoking history Maintenance: Breztri Pt of: Dr. [name]   12/10/2019  - Visit   64 year old female current everyday smoker followed in our office for COPD.  Patient presenting to office today as a 3-week follow-up.  Since last being seen she has followed up with primary care and they have referred her to endocrinology who she will be seeing next week.  She believes this is Dr. [name].  Patient continues to work on stopping smoking.  She reports that she is now successfully set a quit date her quit date is 12/19/2019.  She continues to be maintained on Edwards which she likes.  We will see today if her insurance covers it we sent a prescription.  She also has upcoming scheduled lung cancer screening CT for next week which is managed by our office.  Questionaires / Pulmonary Flowsheets:   MMRC: mMRC Dyspnea Scale mMRC Score  12/10/2019 1  11/19/2019 1  11/12/2019 1    Tests:   10/08/2018-CBC with differential-eosinophils relative 1, eosinophils absolute 0.2  04/30/2016-CT angios chest- cardiomegaly with interstitial pulmonary edema very small bilateral pleural effusions, no pulmonary emboli identified  12/04/2018-CT chest lung cancer screening- biapical pleural-parenchymal scarring, mild centrilobular emphysema, lung RADS 1  03/20/2018-echocardiogram-LV ejection fraction 65 to 40%, grade 1 diastolic dysfunction, mild LVH  11/01/2018- pulmonary function test-FVC 2.42 (76% predicted),  postbronchodilator ratio 74, postbronchodilator FEV1 1.52 (62% predicted), no significant bronchodilator response, DLCO was 51 >>>Patient did take Spiriva prior to testing >>>Patient has not taken Symbicort today  FENO:  No results found for: NITRICOXIDE  PFT: PFT Results Latest Ref Rng & Units 11/01/2018  FVC-Predicted Pre % 76  FVC-Post L 2.07  FVC-Predicted Post % 65  Pre FEV1/FVC % % 65  Post FEV1/FCV % % 74  FEV1-Pre L 1.57  FEV1-Predicted Pre % 64  FEV1-Post L 1.52  DLCO UNC% % 51  DLCO COR %Predicted % 65  TLC L 4.12  TLC % Predicted % 84  RV % Predicted % 90    WALK:  SIX MIN WALK 11/01/2018  Supplimental Oxygen during Test? (L/min) No  Tech Comments: Pt did not desat, however, she could not complete all 3x laps due to SOB, mild dizziness, and mild chest pain.     Imaging: No results found.  Lab Results:  CBC    Component Value Date/Time   WBC 15.2 (H) 12/09/2019 1107   RBC 4.60 12/09/2019 1107   HGB 13.9 12/09/2019 1107   HGB 11.8 07/02/2019 1539   HCT 43.6 12/09/2019 1107   HCT 35.9 07/02/2019 1539   PLT 391 12/09/2019 1107   PLT 518 (H) 07/02/2019 1539   MCV 94.8 12/09/2019 1107   MCV 86 07/02/2019 1539   MCH 30.2 12/09/2019 1107   MCHC 31.9 12/09/2019 1107   RDW 17.9 (H) 12/09/2019 1107   RDW 13.6 07/02/2019 1539   LYMPHSABS 2.2 12/09/2019 1107   LYMPHSABS 1.8 07/02/2019 1539   MONOABS 1.4 (H) 12/09/2019 1107   EOSABS 0.2 12/09/2019 1107  EOSABS 0.1 07/02/2019 1539   BASOSABS 0.1 12/09/2019 1107   BASOSABS 0.1 07/02/2019 1539    BMET    Component Value Date/Time   NA 141 12/09/2019 1107   NA 139 10/13/2019 1431   K 3.8 12/09/2019 1107   CL 105 12/09/2019 1107   CO2 25 12/09/2019 1107   GLUCOSE 208 (H) 12/09/2019 1107   BUN 9 12/09/2019 1107   BUN 8 10/13/2019 1431   CREATININE 0.62 12/09/2019 1107   CREATININE 0.73 09/28/2017 1201   CALCIUM 9.3 12/09/2019 1107   GFRNONAA >60 12/09/2019 1107   GFRNONAA 90 09/28/2017 1201   GFRAA  >60 12/09/2019 1107   GFRAA 104 09/28/2017 1201    BNP    Component Value Date/Time   BNP 446.0 (H) 04/30/2016 0719    ProBNP No results found for: PROBNP  Specialty Problems      Pulmonary Problems   Chronic obstructive pulmonary disease ([place])    11/01/2018- pulmonary function test-FVC 2.42 (76% predicted), postbronchodilator ratio 74, postbronchodilator FEV1 1.52 (62% predicted), no significant bronchodilator response, DLCO was 51 >>>Patient did take Spiriva prior to testing >>>Patient has not taken Symbicort today  12/04/2018-CT chest lung cancer screening- biapical pleural-parenchymal scarring, mild centrilobular emphysema, lung RADS 1      Dyspnea on effort      Allergies  Allergen Reactions  . Bee Venom Swelling  . Codeine Anaphylaxis    Tongue swelled  . Penicillins Swelling and Rash    Has patient had a PCN reaction causing immediate rash, facial/tongue/throat swelling, SOB or lightheadedness with hypotension: No, delayed Has patient had a PCN reaction causing severe rash involving mucus membranes or skin necrosis: No Has patient had a PCN reaction that required hospitalization No Has patient had a PCN reaction occurring within the last 10 years: No If all of the above answers are "NO", then may proceed with Cephalosporin use.   . Bupropion Other (See Comments)    "Made my skin crawl"  . Sudafed [Pseudoephedrine Hcl] Rash    Immunization History  Administered Date(s) Administered  . Influenza,inj,Quad PF,6+ Mos 07/04/2017, 07/10/2018, 07/02/2019  . Pneumococcal Polysaccharide-23 11/01/2018    Past Medical History:  Diagnosis Date  . Anemia 04/30/2016  . Anxiety   . Blood transfusion without reported diagnosis   . Cardiomyopathy ([place])    a. EF 40-45% by echo in 04/2016 b. Improved to 60-65% by repeat imaging in 2018  . CHF (congestive heart failure) ([place])   . COPD (chronic obstructive pulmonary disease) ([place])   . Coronary artery calcification seen on CT  scan   . Essential hypertension   . GERD (gastroesophageal reflux disease)   . History of bronchitis   . History of GI bleed   . Hyperlipidemia   . Iron deficiency anemia 05/12/2016   Bleeding ulcers  . Pollen allergy   . PSVT (paroxysmal supraventricular tachycardia) ([place])   . PVC's (premature ventricular contractions)     Tobacco History: Social History   Tobacco Use  Smoking Status Current Every Day Smoker  . Packs/day: 0.50  . Years: 44.00  . Pack years: 22.00  . Types: Cigarettes  . Start date: 05/10/1975  Smokeless Tobacco Never Used  Tobacco Comment   2 cigarettes per day 12/10/2019   Ready to quit: Yes Counseling given: Yes Comment: 2 cigarettes per day 12/10/2019   Smoking assessment and cessation counseling  Patient currently smoking: 1-2 cigarettes a week  I have advised the patient to quit/stop smoking as soon as possible  due to high risk for multiple medical problems.  It will also be very difficult for [place] to manage patient's  respiratory symptoms and status if we continue to expose her lungs to a known irritant.  We do not advise e-cigarettes as a form of stopping smoking.  Patient is willing to quit smoking. Quit date set for 12/19/19  I have advised the patient that we can assist and have options of nicotine replacement therapy, provided smoking cessation education today, provided smoking cessation counseling, and provided cessation resources.  Follow-up next office visit office visit for assessment of smoking cessation.    Smoking cessation counseling advised for: 4 min     Outpatient Encounter Medications as of 12/10/2019  Medication Sig  . acetaminophen (TYLENOL) 500 MG tablet Take 500 mg by mouth at bedtime as needed for moderate pain.  Marland Kitchen albuterol (PROVENTIL) (2.5 MG/3ML) 0.083% nebulizer solution Take 3 mLs (2.5 mg total) by nebulization every 4 (four) hours as needed for wheezing or shortness of breath.  Marland Kitchen aspirin 81 MG chewable tablet Chew 81 mg  by mouth every morning.   Marland Kitchen atorvastatin (LIPITOR) 20 MG tablet TAKE ONE TABLET (20MG TOTAL) BY MOUTH DAILY (Patient taking differently: Take 20 mg by mouth every morning. )  . bisoprolol (ZEBETA) 10 MG tablet TAKE ONE (1) TABLET BY MOUTH EVERY DAY  . Blood Glucose Monitoring Suppl (ONE TOUCH ULTRA 2) w/Device KIT   . Budeson-Glycopyrrol-Formoterol (BREZTRI AEROSPHERE) 160-9-4.8 MCG/ACT AERO Inhale 2 puffs into the lungs 2 (two) times daily.  . Continuous Blood Gluc Receiver (FREESTYLE LIBRE 14 DAY READER) DEVI See admin instructions.  . Continuous Blood Gluc Sensor (FREESTYLE LIBRE SENSOR SYSTEM) MISC Use as directed  . cyanocobalamin (,VITAMIN B-12,) 1000 MCG/ML injection Inject 1 mL (1,000 mcg total) into the muscle every 30 (thirty) days.  Marland Kitchen gabapentin (NEURONTIN) 300 MG capsule Take 1 capsule (300 mg total) by mouth at bedtime. (Patient taking differently: Take 300 mg by mouth 2 (two) times daily. )  . glucose blood (ONETOUCH ULTRA) test strip Use as instructed  . glucose monitoring kit (FREESTYLE) monitoring kit Use to check blood sugar BID as directed  . Lancets (ONETOUCH DELICA PLUS [id number]) MISC 1 each by Does not apply route 2 (two) times a day.  . losartan (COZAAR) 100 MG tablet TAKE ONE (1) TABLET BY MOUTH EVERY DAY  . metFORMIN (GLUCOPHAGE) 1000 MG tablet Take 1 tablet (1,000 mg total) by mouth 2 (two) times daily with a meal.  . metFORMIN (GLUCOPHAGE-XR) 500 MG 24 hr tablet TAKE 2 TABLETS (1000 MG TOTAL) BY MOUTH DAILY, WITH BREAKFAST.  . Misc. Devices MISC Please provide supplies (needle, syringe, alcohol swabs) needed for patient to self-administer B-12 injections monthly.  . nicotine polacrilex (COMMIT) 4 MG lozenge Take 1 lozenge (4 mg total) by mouth as needed for smoking cessation.  . pantoprazole (PROTONIX) 40 MG tablet TAKE ONE (1) TABLET 30 MINS BEFORE YOUR FIRST MEAL.  . Budeson-Glycopyrrol-Formoterol (BREZTRI AEROSPHERE) 160-9-4.8 MCG/ACT AERO Inhale 2 puffs into the  lungs 2 (two) times daily.   No facility-administered encounter medications on file as of 12/10/2019.     Review of Systems  Review of Systems  Constitutional: Positive for fatigue. Negative for activity change, chills, fever and unexpected weight change.  HENT: Negative for postnasal drip, rhinorrhea, sinus pressure, sinus pain and sore throat.   Eyes: Negative.   Respiratory: Positive for cough. Negative for shortness of breath and wheezing.   Cardiovascular: Negative for chest pain and palpitations.  Gastrointestinal: Negative for constipation, diarrhea, nausea and vomiting.  Endocrine: Negative.   Genitourinary: Negative.   Musculoskeletal: Negative.   Skin: Negative.   Neurological: Negative for dizziness and headaches.  Psychiatric/Behavioral: Negative.  Negative for dysphoric mood. The patient is not nervous/anxious.   All other systems reviewed and are negative.    Physical Exam  BP 122/80 (BP Location: Left Arm, Patient Position: Sitting, Cuff Size: Normal)   Pulse 80   Temp 98.4 F (36.9 C) (Temporal)   Ht '5\' 4"'  (1.626 m)   Wt 152 lb (68.9 kg)   SpO2 96% Comment: on RA  BMI 26.09 kg/m   Wt Readings from Last 5 Encounters:  12/10/19 152 lb (68.9 kg)  11/21/19 155 lb (70.3 kg)  11/19/19 157 lb (71.2 kg)  11/12/19 156 lb (70.8 kg)  10/27/19 140 lb (63.5 kg)    BMI Readings from Last 5 Encounters:  12/10/19 26.09 kg/m  11/21/19 26.61 kg/m  11/19/19 26.95 kg/m  11/12/19 26.78 kg/m  10/27/19 24.03 kg/m     Physical Exam Vitals and nursing note reviewed.  Constitutional:      General: She is not in acute distress.    Appearance: Normal appearance. She is obese.  HENT:     Head: Normocephalic and atraumatic.     Right Ear: Hearing, tympanic membrane, ear canal and external ear normal. There is no impacted cerumen.     Left Ear: Hearing, tympanic membrane, ear canal and external ear normal. There is no impacted cerumen.     Nose: Nose normal. No  mucosal edema or rhinorrhea.     Right Turbinates: Not enlarged.     Left Turbinates: Not enlarged.     Mouth/Throat:     Mouth: Mucous membranes are dry.     Pharynx: Oropharynx is clear. No oropharyngeal exudate.  Eyes:     Pupils: Pupils are equal, round, and reactive to light.  Cardiovascular:     Rate and Rhythm: Normal rate and regular rhythm.     Pulses: Normal pulses.     Heart sounds: Normal heart sounds. No murmur.  Pulmonary:     Effort: Pulmonary effort is normal.     Breath sounds: No decreased breath sounds, wheezing or rales.     Comments: Diminished breath sounds on exam Musculoskeletal:     Cervical back: Normal range of motion.     Right lower leg: No edema.     Left lower leg: No edema.  Lymphadenopathy:     Cervical: No cervical adenopathy.  Skin:    General: Skin is warm and dry.     Capillary Refill: Capillary refill takes less than 2 seconds.     Findings: No erythema or rash.  Neurological:     General: No focal deficit present.     Mental Status: She is alert and oriented to person, place, and time.     Motor: No weakness.     Coordination: Coordination normal.     Gait: Gait is intact. Gait normal.  Psychiatric:        Mood and Affect: Mood normal.        Behavior: Behavior normal. Behavior is cooperative.        Thought Content: Thought content normal.        Judgment: Judgment normal.       Assessment & Plan:   Chronic obstructive pulmonary disease ([place]) Plan:  Continue Breztri  Prescription sent to pharmacy Follow-up with our office in 3 months Congratulated patient  on setting quit date, encourage patient to stop smoking Complete lung cancer screening CT this month   Healthcare maintenance Plan: We do recommend the Covid vaccine Patient reports that she plans on obtaining it  Tobacco abuse Patient reporting that she is set a quit date on 12/19/2019 Congratulated patient on setting this date  Plan: Continue forward with  stopping smoking Complete lung cancer screening CT in March/2021    Return in about 3 months (around 03/11/2020), or if symptoms worsen or fail to improve, for Follow up with Dr. [name].   Lauraine Rinne, NP 12/10/2019   This appointment required 25 minutes of patient care (this includes precharting, chart review, review of results, face-to-face care, etc.).

## 2019-12-09 ENCOUNTER — Other Ambulatory Visit: Payer: Self-pay

## 2019-12-09 ENCOUNTER — Inpatient Hospital Stay (HOSPITAL_COMMUNITY): Payer: PPO

## 2019-12-09 ENCOUNTER — Other Ambulatory Visit: Payer: Self-pay | Admitting: Family Medicine

## 2019-12-09 ENCOUNTER — Inpatient Hospital Stay (HOSPITAL_COMMUNITY): Payer: PPO | Attending: Hematology

## 2019-12-09 DIAGNOSIS — M1811 Unilateral primary osteoarthritis of first carpometacarpal joint, right hand: Secondary | ICD-10-CM | POA: Diagnosis not present

## 2019-12-09 DIAGNOSIS — E1165 Type 2 diabetes mellitus with hyperglycemia: Secondary | ICD-10-CM

## 2019-12-09 DIAGNOSIS — R200 Anesthesia of skin: Secondary | ICD-10-CM | POA: Diagnosis not present

## 2019-12-09 DIAGNOSIS — D500 Iron deficiency anemia secondary to blood loss (chronic): Secondary | ICD-10-CM

## 2019-12-09 DIAGNOSIS — D509 Iron deficiency anemia, unspecified: Secondary | ICD-10-CM | POA: Insufficient documentation

## 2019-12-09 LAB — CBC WITH DIFFERENTIAL/PLATELET
Abs Immature Granulocytes: 0.09 10*3/uL — ABNORMAL HIGH (ref 0.00–0.07)
Basophils Absolute: 0.1 10*3/uL (ref 0.0–0.1)
Basophils Relative: 0 %
Eosinophils Absolute: 0.2 10*3/uL (ref 0.0–0.5)
Eosinophils Relative: 2 %
HCT: 43.6 % (ref 36.0–46.0)
Hemoglobin: 13.9 g/dL (ref 12.0–15.0)
Immature Granulocytes: 1 %
Lymphocytes Relative: 14 %
Lymphs Abs: 2.2 10*3/uL (ref 0.7–4.0)
MCH: 30.2 pg (ref 26.0–34.0)
MCHC: 31.9 g/dL (ref 30.0–36.0)
MCV: 94.8 fL (ref 80.0–100.0)
Monocytes Absolute: 1.4 10*3/uL — ABNORMAL HIGH (ref 0.1–1.0)
Monocytes Relative: 9 %
Neutro Abs: 11.3 10*3/uL — ABNORMAL HIGH (ref 1.7–7.7)
Neutrophils Relative %: 74 %
Platelets: 391 10*3/uL (ref 150–400)
RBC: 4.6 MIL/uL (ref 3.87–5.11)
RDW: 17.9 % — ABNORMAL HIGH (ref 11.5–15.5)
WBC: 15.2 10*3/uL — ABNORMAL HIGH (ref 4.0–10.5)
nRBC: 0 % (ref 0.0–0.2)

## 2019-12-09 LAB — IRON AND TIBC
Iron: 48 ug/dL (ref 28–170)
Saturation Ratios: 13 % (ref 10.4–31.8)
TIBC: 373 ug/dL (ref 250–450)
UIBC: 325 ug/dL

## 2019-12-09 LAB — COMPREHENSIVE METABOLIC PANEL
ALT: 19 U/L (ref 0–44)
AST: 15 U/L (ref 15–41)
Albumin: 3.8 g/dL (ref 3.5–5.0)
Alkaline Phosphatase: 103 U/L (ref 38–126)
Anion gap: 11 (ref 5–15)
BUN: 9 mg/dL (ref 8–23)
CO2: 25 mmol/L (ref 22–32)
Calcium: 9.3 mg/dL (ref 8.9–10.3)
Chloride: 105 mmol/L (ref 98–111)
Creatinine, Ser: 0.62 mg/dL (ref 0.44–1.00)
GFR calc Af Amer: >60 mL/min (ref >60)
GFR calc non Af Amer: >60 mL/min (ref >60)
Glucose, Bld: 208 mg/dL — ABNORMAL HIGH (ref 70–99)
Potassium: 3.8 mmol/L (ref 3.5–5.1)
Sodium: 141 mmol/L (ref 135–145)
Total Bilirubin: 0.4 mg/dL (ref 0.3–1.2)
Total Protein: 7.2 g/dL (ref 6.5–8.1)

## 2019-12-09 LAB — FOLATE: Folate: 13.5 ng/mL (ref >5.9)

## 2019-12-09 LAB — FERRITIN: Ferritin: 50 ng/mL (ref 11–307)

## 2019-12-09 LAB — VITAMIN B12: Vitamin B-12: 455 pg/mL (ref 180–914)

## 2019-12-09 LAB — LACTATE DEHYDROGENASE: LDH: 108 U/L (ref 98–192)

## 2019-12-09 LAB — VITAMIN D 25 HYDROXY (VIT D DEFICIENCY, FRACTURES): Vit D, 25-Hydroxy: 28.98 ng/mL — ABNORMAL LOW (ref 30–100)

## 2019-12-09 NOTE — Telephone Encounter (Signed)
Last VV 05/22/19 Last fill 08/19/19  #180/3 Pt need an office visit before any other refills.

## 2019-12-10 ENCOUNTER — Other Ambulatory Visit: Payer: Self-pay

## 2019-12-10 ENCOUNTER — Telehealth: Payer: Self-pay | Admitting: Pulmonary Disease

## 2019-12-10 ENCOUNTER — Ambulatory Visit: Payer: PPO | Admitting: Pulmonary Disease

## 2019-12-10 ENCOUNTER — Encounter: Payer: Self-pay | Admitting: Pulmonary Disease

## 2019-12-10 VITALS — BP 122/80 | HR 80 | Temp 98.4°F | Ht 64.0 in | Wt 152.0 lb

## 2019-12-10 DIAGNOSIS — Z720 Tobacco use: Secondary | ICD-10-CM

## 2019-12-10 DIAGNOSIS — J449 Chronic obstructive pulmonary disease, unspecified: Secondary | ICD-10-CM

## 2019-12-10 DIAGNOSIS — E1169 Type 2 diabetes mellitus with other specified complication: Secondary | ICD-10-CM

## 2019-12-10 DIAGNOSIS — Z0000 Encounter for general adult medical examination without abnormal findings: Secondary | ICD-10-CM

## 2019-12-10 DIAGNOSIS — F17210 Nicotine dependence, cigarettes, uncomplicated: Secondary | ICD-10-CM

## 2019-12-10 MED ORDER — BREZTRI AEROSPHERE 160-9-4.8 MCG/ACT IN AERO: 2.0000 | INHALATION_SPRAY | Freq: Two times a day (BID) | RESPIRATORY_TRACT | 5 refills | Status: DC

## 2019-12-10 NOTE — Assessment & Plan Note (Signed)
Plan:  Continue Breztri  Prescription sent to pharmacy Follow-up with our office in 3 months Congratulated patient on setting quit date, encourage patient to stop smoking Complete lung cancer screening CT this month

## 2019-12-10 NOTE — Patient Instructions (Addendum)
You were seen today by Lauraine Rinne, NP  for:   [facility] seeing you today.  Proud of all the progress you are making.  Great job setting a quit date.  Let us know if you have any questions or concerns.  Good luck with endocrinology.  We will bring you back here in about 3 months.  We will send a prescription for Breo straight to your pharmacy to see if it is affordable.  Complete the lung cancer screening CT as scheduled.  Take care of yourself and stay safe,  Tammy Mcpherson  1. Chronic obstructive pulmonary disease, unspecified COPD type ([place])  Breztri >>> 2 puffs in the morning right when you wake up, rinse out your mouth after use, 12 hours later 2 puffs, rinse after use >>> Take this daily, no matter what >>> This is not a rescue inhaler   Only use your albuterol as a rescue medication to be used if you can't catch your breath by resting or doing a relaxed purse lip breathing pattern.  - The less you use it, the better it will work when you need it. - Ok to use up to 2 puffs  every 4 hours if you must but call for immediate appointment if use goes up over your usual need - Don't leave home without it !!  (think of it like the spare tire for your car)   Note your daily symptoms > remember "red flags" for COPD:   >>>Increase in cough >>>increase in sputum production >>>increase in shortness of breath or activity  intolerance.   If you notice these symptoms, please call the office to be seen.    2. Tobacco abuse  Great job setting a quit date for stopping smoking!  Were proud of you excavation mark  Complete lung cancer screening CT as scheduled next week  3. Healthcare maintenance  We do recommend the Covid vaccine for you  COVID-19 Vaccine Information can be found at: ShippingScam.co.uk For questions related to vaccine distribution or appointments, please email [email or web address] or call [phone].     Follow Up:     Return in about 3 months (around 03/11/2020), or if symptoms worsen or fail to improve, for Follow up with Dr. [name].   Please do your part to reduce the spread of COVID-19:      Reduce your risk of any infection  and COVID19 by using the similar precautions used for avoiding the common cold or flu:  Marland Kitchen Wash your hands often with soap and warm water for at least 20 seconds.  If soap and water are not readily available, use an alcohol-based hand sanitizer with at least 60% alcohol.  . If coughing or sneezing, cover your mouth and nose by coughing or sneezing into the elbow areas of your shirt or coat, into a tissue or into your sleeve (not your hands). Langley Gauss A MASK when in public  . Avoid shaking hands with others and consider head nods or verbal greetings only. . Avoid touching your eyes, nose, or mouth with unwashed hands.  . Avoid close contact with people who are sick. . Avoid places or events with large numbers of people in one location, like concerts or sporting events. . If you have some symptoms but not all symptoms, continue to monitor at home and seek medical attention if your symptoms worsen. . If you are having a medical emergency, call 911.   ADDITIONAL HEALTHCARE OPTIONS FOR PATIENTS  [facility] / e-Visit: [email or web address]  [facility]: [facility]: [id number]                   [facility]: [id number]     It is flu season:   >>> Best ways to protect herself from the flu: Receive the yearly flu vaccine, practice good hand hygiene washing with soap and also using hand sanitizer when available, eat a nutritious meals, get adequate rest, hydrate appropriately   Please contact the office if your symptoms worsen or you have concerns that you are not improving.   Thank you for choosing [facility] for your healthcare, and for allowing [place]  to partner with you on your healthcare journey. I am thankful to be able to provide care to you today.   [name] FNP-C

## 2019-12-10 NOTE — Assessment & Plan Note (Signed)
Patient reporting that she is set a quit date on 12/19/2019 Congratulated patient on setting this date  Plan: Continue forward with stopping smoking Complete lung cancer screening CT in March/2021

## 2019-12-10 NOTE — Telephone Encounter (Signed)
Assessment & Plan Note by Lauraine Rinne, NP at 12/10/2019 12:28 PM Author: Lauraine Rinne, NP Author Type: Nurse Practitioner Filed: 12/10/2019 12:29 PM  Note Status: Written Cosign: Cosign Not Required Encounter Date: 12/10/2019  Problem: Chronic obstructive pulmonary disease [facility])  Editor: Lauraine Rinne, NP (Nurse Practitioner)    Plan:  Jim Like  Prescription sent to pharmacy Follow-up with our office in 3 months Congratulated patient on setting quit date, encourage patient to stop smoking Complete lung cancer screening CT this month     Called and spoke with pt. Pt stated her pharmacy was to send info to our office in regards to her Breztri inhaler for Aaron Edelman to review. Asked pt if it was PA and she stated she did not know.  [place] and spoke with Tammy to get more info and she stated a PA request was sent to our office. PA has been initiated through [email or web address]   Medication name and strength: breztri Provider: [name] Pharmacy: [facility] Patient insurance ID: [id number] Phone: [phone] Fax: [phone]  Was the PA started on CMM?  yes If yes, please enter the Key: [id number] Timeframe for approval/denial: was not given a time frame. Message stated on the PA that was initiated stated that a determination would be stated electronically on the request that was started.  Routing this to [employer].

## 2019-12-10 NOTE — Assessment & Plan Note (Signed)
Plan: Keep follow-up with primary care Establish with endocrinology

## 2019-12-10 NOTE — Assessment & Plan Note (Signed)
Plan: We do recommend the Covid vaccine Patient reports that she plans on obtaining it

## 2019-12-11 NOTE — Telephone Encounter (Signed)
Checked on status of PA. PA has been approved until 10/01/2020.   [facility]. They are aware of the approval.   Nothing further needed at time of call.

## 2019-12-12 NOTE — Progress Notes (Signed)
PCCM: Thanks for seeing  Tammy Nash, DO [facility] 12/12/2019 6:02 PM

## 2019-12-16 ENCOUNTER — Encounter (HOSPITAL_COMMUNITY): Payer: Self-pay | Admitting: Hematology

## 2019-12-16 ENCOUNTER — Other Ambulatory Visit: Payer: Self-pay

## 2019-12-16 ENCOUNTER — Inpatient Hospital Stay (HOSPITAL_BASED_OUTPATIENT_CLINIC_OR_DEPARTMENT_OTHER): Payer: PPO | Admitting: Hematology

## 2019-12-16 DIAGNOSIS — D500 Iron deficiency anemia secondary to blood loss (chronic): Secondary | ICD-10-CM | POA: Diagnosis not present

## 2019-12-16 NOTE — Progress Notes (Signed)
Virtual Visit via Telephone Note  I connected with Tammy Mcpherson on 12/16/19 at  2:20 PM EDT by telephone and verified that I am speaking with the correct person using two identifiers.   I discussed the limitations, risks, security and privacy concerns of performing an evaluation and management service by telephone and the availability of in person appointments. I also discussed with the patient that there may be a patient responsible charge related to this service. The patient expressed understanding and agreed to proceed.   History of Present Illness: She was seen in the clinic for iron deficiency anemia and receives intermittent iron infusions.  She was also worked up previously for leukocytosis.    Observations/Objective: She denies any bleeding per rectum or melena.  Her energy has gotten better after last Feraheme infusion in December.  Appetite is reported as 100%.  Energy levels are 50%.  Denies any fevers, night sweats or weight loss.  Assessment and Plan:  1.  Iron deficiency anemia: -Thought to be from chronic GI blood loss from gastric and small bowel AVMs.  Patient on aspirin 81 mg.  Plavix discontinued in October 2019.  She had a history of multiple blood transfusions. -Last Feraheme infusion on 09/18/2019 and 09/29/2019. -We reviewed labs from 12/09/2019.  Ferritin is 50 and % saturation is 13.  Previous ferritin on 09/03/2019 was 7. -Hemoglobin is 13.9. -Her energy levels improved after her last iron infusion. -We will see her back in 3 months with repeat labs.  2.  Leukocytosis and thrombocytosis: -Sobieski count on 12/09/2019 was 15.2.  Differential shows 74% neutrophils, 14% lymphocytes and 9% monocytes and 2% eosinophils. -Prior work-up for JAK2 V617F testing and BCR/ABL by FISH was negative. -Platelets are normalized to 391.  This is reactive from iron deficiency. -She is on steroid inhalers which could be contributing to leukocytosis.  3.  B12 deficiency: -She is on  monthly B12 injections. -Her B12 on 12/09/2019 was 455.   Follow Up Instructions: RTC 3 months with labs.   I discussed the assessment and treatment plan with the patient. The patient was provided an opportunity to ask questions and all were answered. The patient agreed with the plan and demonstrated an understanding of the instructions.   The patient was advised to call back or seek an in-person evaluation if the symptoms worsen or if the condition fails to improve as anticipated.  I provided 11 minutes of non-face-to-face time during this encounter.   Derek Jack, MD

## 2019-12-17 ENCOUNTER — Ambulatory Visit (HOSPITAL_COMMUNITY)
Admission: RE | Admit: 2019-12-17 | Discharge: 2019-12-17 | Disposition: A | Discharge status: Home / Self Care | Payer: PPO | Source: Ambulatory Visit | Attending: Acute Care | Admitting: Acute Care

## 2019-12-17 DIAGNOSIS — Z87891 Personal history of nicotine dependence: Secondary | ICD-10-CM

## 2019-12-17 DIAGNOSIS — F17200 Nicotine dependence, unspecified, uncomplicated: Secondary | ICD-10-CM | POA: Diagnosis not present

## 2019-12-17 DIAGNOSIS — Z122 Encounter for screening for malignant neoplasm of respiratory organs: Secondary | ICD-10-CM | POA: Diagnosis not present

## 2019-12-17 DIAGNOSIS — F17210 Nicotine dependence, cigarettes, uncomplicated: Secondary | ICD-10-CM

## 2019-12-18 ENCOUNTER — Other Ambulatory Visit: Payer: Self-pay | Admitting: *Deleted

## 2019-12-18 DIAGNOSIS — F17210 Nicotine dependence, cigarettes, uncomplicated: Secondary | ICD-10-CM

## 2019-12-18 DIAGNOSIS — Z87891 Personal history of nicotine dependence: Secondary | ICD-10-CM

## 2019-12-18 NOTE — Progress Notes (Signed)
Please call patient and let them  know their  low dose Ct was read as a Lung RADS 2: nodules that are benign in appearance and behavior with a very low likelihood of becoming a clinically active cancer due to size or lack of growth. Recommendation per radiology is for a repeat LDCT in 12 months. .Please let them  know we will order and schedule their  annual screening scan for 11/2020 Please let them  know there was notation of CAD on their  scan.  Please remind the patient  that this is a non-gated exam therefore degree or severity of disease  cannot be determined. Please have them  follow up with their PCP regarding potential risk factor modification, dietary therapy or pharmacologic therapy if clinically indicated. Pt.  is  currently on statin therapy. Please place order for annual  screening scan for  11/2020 and fax results to PCP. Thanks so much.

## 2019-12-19 ENCOUNTER — Other Ambulatory Visit: Payer: Self-pay

## 2019-12-23 ENCOUNTER — Ambulatory Visit (INDEPENDENT_AMBULATORY_CARE_PROVIDER_SITE_OTHER): Payer: PPO | Admitting: Internal Medicine

## 2019-12-23 ENCOUNTER — Encounter: Payer: Self-pay | Admitting: Internal Medicine

## 2019-12-23 ENCOUNTER — Other Ambulatory Visit: Payer: Self-pay

## 2019-12-23 VITALS — BP 172/82 | HR 75 | Temp 98.2°F | Ht 64.0 in | Wt 156.0 lb

## 2019-12-23 DIAGNOSIS — E1165 Type 2 diabetes mellitus with hyperglycemia: Secondary | ICD-10-CM | POA: Insufficient documentation

## 2019-12-23 DIAGNOSIS — E1140 Type 2 diabetes mellitus with diabetic neuropathy, unspecified: Secondary | ICD-10-CM

## 2019-12-23 DIAGNOSIS — E1159 Type 2 diabetes mellitus with other circulatory complications: Secondary | ICD-10-CM

## 2019-12-23 DIAGNOSIS — E119 Type 2 diabetes mellitus without complications: Secondary | ICD-10-CM | POA: Insufficient documentation

## 2019-12-23 LAB — POCT GLYCOSYLATED HEMOGLOBIN (HGB A1C): Hemoglobin A1C: 8.7 % — AB (ref 4.0–5.6)

## 2019-12-23 MED ORDER — JARDIANCE 10 MG PO TABS: 10.0000 mg | ORAL_TABLET | Freq: Every day | ORAL | 4 refills | Status: DC

## 2019-12-23 NOTE — Patient Instructions (Signed)
-   Continue Metformin 1000 m g Twice a day  - Start Jardiance 10 mg, 1 tablet with Breakfast       HOW TO TREAT LOW BLOOD SUGARS (Blood sugar LESS THAN 70 MG/DL)  Please follow the RULE OF 15 for the treatment of hypoglycemia treatment (when your (blood sugars are less than 70 mg/dL)    STEP 1: Take 15 grams of carbohydrates when your blood sugar is low, which includes:   3-4 GLUCOSE TABS  OR  3-4 OZ OF JUICE OR REGULAR SODA OR  ONE TUBE OF GLUCOSE GEL     STEP 2: RECHECK blood sugar in 15 MINUTES STEP 3: If your blood sugar is still low at the 15 minute recheck --> then, go back to STEP 1 and treat AGAIN with another 15 grams of carbohydrates.

## 2019-12-23 NOTE — Progress Notes (Signed)
Name: Tammy Mcpherson  MRN/ DOB: [id number], 07-30-1956   Age/ Sex: 64 y.o., adult    PCP: Tammy Coss, NP   Reason for Endocrinology Evaluation: Type 2 Diabetes Mellitus     Date of Initial Endocrinology Visit: 12/23/2019     PATIENT IDENTIFIER: Tammy Mcpherson is a 64 y.o. adult with a past medical history of T2DM, CHF, Dyslipidemia and PVD. The patient presented for initial endocrinology clinic visit on 12/23/2019 for consultative assistance with her diabetes management.    HPI: Tammy Mcpherson was    Diagnosed with DM in 01/2019 Prior Medications tried/Intolerance: Metformin  Currently checking blood sugars multiple times a day through the CGM  Hypoglycemia episodes : yes- per cgm            Symptoms:                 Frequency: 2/ month  Hemoglobin A1c has ranged from 7.0% in 2021, peaking at 11.8% in 2020. Patient required assistance for hypoglycemia: no  Patient has required hospitalization within the last 1 year from hyper or hypoglycemia: no   In terms of diet, the patient eats 3 meals a day, does not snack, drinks diet drinks.    HOME DIABETES REGIMEN: Metformin 1000 mg BID    Statin: yes ACE-I/ARB: yes  Prior Diabetic Education: no    CONTINUOUS GLUCOSE MONITORING RECORD INTERPRETATION    Dates of Recording: 3/10-3/23/2021  Sensor description:Freestyle   Results statistics:   CGM use % of time 99  Average and SD 181  Time in range   48     %  % Time Above 180 48  % Time above 250 4  % Time Below target 0    Glycemic patterns summary: Hyperglycemia through the day and night  Hyperglycemic episodes  After lunch   Hypoglycemic episodes occurred n/a  Overnight periods: high        DIABETIC COMPLICATIONS: Microvascular complications:   Neuropathy  Denies: CKD  Last eye exam: Completed  > 15 yrs ago   Macrovascular complications:   CHF, PAD  Denies: CVA   PAST HISTORY: Past Medical History:  Past Medical History:  Diagnosis Date    . Anemia 04/30/2016  . Anxiety   . Blood transfusion without reported diagnosis   . Cardiomyopathy ([place])    a. EF 40-45% by echo in 04/2016 b. Improved to 60-65% by repeat imaging in 2018  . CHF (congestive heart failure) ([place])   . COPD (chronic obstructive pulmonary disease) ([place])   . Coronary artery calcification seen on CT scan   . Essential hypertension   . GERD (gastroesophageal reflux disease)   . History of bronchitis   . History of GI bleed   . Hyperlipidemia   . Iron deficiency anemia 05/12/2016   Bleeding ulcers  . Pollen allergy   . PSVT (paroxysmal supraventricular tachycardia) ([place])   . PVC's (premature ventricular contractions)    Past Surgical History:  Past Surgical History:  Procedure Laterality Date  . ABDOMINAL HYSTERECTOMY     polyps  about age 42  . Barbie Banner OSTEOTOMY Right 07/10/2013   Procedure: Barbie Banner OSTEOTOMY RIGHT FOOT;  Surgeon: Marcheta Grammes, DPM;  Location: [facility];  Service: Orthopedics;  Laterality: Right;  . AMPUTATION Left 05/09/2017   Procedure: AMPUTATION 5TH TOE LEFT FOOT;  Surgeon: [name], DPM;  Location: [facility];  Service: Podiatry;  Laterality: Left;  . AMPUTATION Left 06/13/2017   Procedure: AMPUTATION 2ND TOE LEFT FOOT;  Surgeon: [name], DPM;  Location: [facility];  Service: Podiatry;  Laterality: Left;  . BUNIONECTOMY Right 07/10/2013   Procedure: VOGLER BUNIONECTOMY RIGHT FOOT;  Surgeon: Marcheta Grammes, DPM;  Location: [facility];  Service: Orthopedics;  Laterality: Right;  . CHOLECYSTECTOMY N/A 08/22/2017   Procedure: LAPAROSCOPIC CHOLECYSTECTOMY;  Surgeon: Virl Cagey, MD;  Location: [facility];  Service: General;  Laterality: N/A;  . COLONOSCOPY N/A 07/07/2016   Dr. Oneida Alar; redundant left colon, diverticulosis at hepatic flexure, non-bleeding internal hemorrhoids  . ESOPHAGOGASTRODUODENOSCOPY N/A 07/07/2016   Dr. Oneida Alar: many non-bleeding cratered gastric ulcers without stigmata of bleeding in gastric antrum.  four 2-3 mm angioectasias without bleeding in duodenal bulb and second portion of duodenum s/p APC. Chroni gastritis on path.   . ESOPHAGOGASTRODUODENOSCOPY N/A 08/03/2017   Dr. Oneida Alar: erosive gastritis, AVMs. Found a single non-bleeding angioectasia in stomach, s/p APC therapy. Four non-bleeding angioectasias in duodenum s/p APC. Non-bleeding erosive gastropathy  . IR ANGIOGRAM EXTREMITY LEFT  04/24/2017  . IR FEM POP ART ATHERECT INC PTA MOD SED  04/24/2017  . IR INFUSION THROMBOL ARTERIAL INITIAL (MS)  04/24/2017  . IR RADIOLOGIST EVAL & MGMT  12/05/2016  . IR US GUIDE VASC ACCESS RIGHT  04/24/2017  . LIVER BIOPSY N/A 08/22/2017   Procedure: LIVER BIOPSY;  Surgeon: Virl Cagey, MD;  Location: [facility];  Service: General;  Laterality: N/A;  . METATARSAL HEAD EXCISION Right 07/10/2013   Procedure: METATARSAL HEAD RESECTION OF DIGITS 2 AND 3 RIGHT FOOT;  Surgeon: Marcheta Grammes, DPM;  Location: [facility];  Service: Orthopedics;  Laterality: Right;  . PROXIMAL INTERPHALANGEAL FUSION (PIP) Right 07/10/2013   Procedure: ARTHRODESIS PIPJ  2ND DIGIT RIGHT FOOT;  Surgeon: Marcheta Grammes, DPM;  Location: [facility];  Service: Orthopedics;  Laterality: Right;      Social History:  reports that she has been smoking cigarettes. She started smoking about 44 years ago. She has a 22.00 pack-year smoking history. She has never used smokeless tobacco. She reports previous alcohol use of about 2.0 standard drinks of alcohol per week. She reports that she does not use drugs. Family History:  Family History  Adopted: Yes  Problem Relation Age of Onset  . Hypertension Father   . Coronary artery disease Sister   . Ulcers Maternal Grandmother   . Colon cancer Neg Hx        unknown, was adopted     HOME MEDICATIONS: Allergies as of 12/23/2019      Reactions   Bee Venom Swelling   Codeine Anaphylaxis   Tongue swelled   Penicillins Swelling, Rash   Has patient had a PCN reaction causing immediate  rash, facial/tongue/throat swelling, SOB or lightheadedness with hypotension: No, delayed Has patient had a PCN reaction causing severe rash involving mucus membranes or skin necrosis: No Has patient had a PCN reaction that required hospitalization No Has patient had a PCN reaction occurring within the last 10 years: No If all of the above answers are "NO", then may proceed with Cephalosporin use.   Bupropion Other (See Comments)   "Made my skin crawl"   Sudafed [pseudoephedrine Hcl] Rash      Medication List       Accurate as of December 23, 2019  1:52 PM. If you have any questions, ask your nurse or doctor.        STOP taking these medications   nicotine polacrilex 4 MG lozenge Commonly known as: Commit Stopped by: Dorita Sciara, MD  TAKE these medications   acetaminophen 500 MG tablet Commonly known as: TYLENOL Take 500 mg by mouth at bedtime as needed for moderate pain.   albuterol (2.5 MG/3ML) 0.083% nebulizer solution Commonly known as: PROVENTIL Take 3 mLs (2.5 mg total) by nebulization every 4 (four) hours as needed for wheezing or shortness of breath.   aspirin 81 MG chewable tablet Chew 81 mg by mouth every morning.   atorvastatin 20 MG tablet Commonly known as: LIPITOR TAKE ONE TABLET (20MG TOTAL) BY MOUTH DAILY What changed: See the new instructions.   bisoprolol 10 MG tablet Commonly known as: ZEBETA TAKE ONE (1) TABLET BY MOUTH EVERY DAY   Breztri Aerosphere 160-9-4.8 MCG/ACT Aero Generic drug: Budeson-Glycopyrrol-Formoterol Inhale 2 puffs into the lungs 2 (two) times daily.   cyanocobalamin 1000 MCG/ML injection Commonly known as: (VITAMIN B-12) Inject 1 mL (1,000 mcg total) into the muscle every 30 (thirty) days.   FreeStyle Libre 14 Day Reader Kerrin Mo See admin instructions.   FreeStyle [employer] Use as directed   gabapentin 300 MG capsule Commonly known as: NEURONTIN Take 1 capsule (300 mg total) by mouth at  bedtime. What changed: when to take this   glucose blood test strip Commonly known as: OneTouch Ultra Use as instructed   glucose monitoring kit monitoring kit Use to check blood sugar BID as directed   ONE TOUCH ULTRA 2 w/Device Kit   losartan 100 MG tablet Commonly known as: COZAAR TAKE ONE (1) TABLET BY MOUTH EVERY DAY   metFORMIN 1000 MG tablet Commonly known as: GLUCOPHAGE Take 1 tablet (1,000 mg total) by mouth 2 (two) times daily with a meal. What changed: Another medication with the same name was removed. Continue taking this medication, and follow the directions you see here. Changed by: Dorita Sciara, MD   Misc. Devices Misc Please provide supplies (needle, syringe, alcohol swabs) needed for patient to self-administer B-12 injections monthly.   OneTouch Delica Plus [id number] Misc 1 each by Does not apply route 2 (two) times a day.   pantoprazole 40 MG tablet Commonly known as: PROTONIX TAKE ONE (1) TABLET 30 MINS BEFORE YOUR FIRST MEAL.        ALLERGIES: Allergies  Allergen Reactions  . Bee Venom Swelling  . Codeine Anaphylaxis    Tongue swelled  . Penicillins Swelling and Rash    Has patient had a PCN reaction causing immediate rash, facial/tongue/throat swelling, SOB or lightheadedness with hypotension: No, delayed Has patient had a PCN reaction causing severe rash involving mucus membranes or skin necrosis: No Has patient had a PCN reaction that required hospitalization No Has patient had a PCN reaction occurring within the last 10 years: No If all of the above answers are "NO", then may proceed with Cephalosporin use.   . Bupropion Other (See Comments)    "Made my skin crawl"  . Sudafed [Pseudoephedrine Hcl] Rash     REVIEW OF SYSTEMS: A comprehensive ROS was conducted with the patient and is negative except as per HPI and below:  Review of Systems  Gastrointestinal: Negative for diarrhea and nausea.  Neurological: Positive for tingling.       OBJECTIVE:   VITAL SIGNS: BP (!) 172/82 (BP Location: Left Arm, Patient Position: Sitting, Cuff Size: Normal)   Pulse 75   Temp 98.2 F (36.8 C)   Ht '5\' 4"'  (1.626 m)   Wt 156 lb (70.8 kg)   SpO2 95%   BMI 26.78 kg/m    PHYSICAL EXAM:  General: Pt appears well and is in NAD  Neck: General: Supple without adenopathy or carotid bruits. Thyroid: Thyroid size normal.  No goiter or nodules appreciated. No thyroid bruit.  Lungs: Clear with good BS bilat with no rales, rhonchi, or wheezes  Heart: RRR with normal S1 and S2 and no gallops; no murmurs; no rub  Abdomen: Normoactive bowel sounds, soft, nontender, without masses or organomegaly palpable  Extremities:  Lower extremities - No pretibial edema. No lesions.  Skin: Normal texture and temperature to palpation. No rash noted. No Acanthosis nigricans/skin tags. No lipohypertrophy.  Neuro: MS is good with appropriate affect, pt is alert and Ox3    DM Foot exam:  Left 4th and 5th toe amputations secondary to gangrene  Left bunion  The pedal pulses are 1+ on right and 1+ on left. The sensation is decreased  to a screening 5.07, 10 gram monofilament bilaterally   DATA REVIEWED:  Lab Results  Component Value Date   HGBA1C 8.7 (A) 12/23/2019   HGBA1C 7.0 (H) 10/13/2019   HGBA1C 8.7 (H) 08/19/2019   Lab Results  Component Value Date   MICROALBUR 0.3 09/28/2017   LDLCALC 98 07/02/2019   CREATININE 0.62 12/09/2019   Lab Results  Component Value Date   MICRALBCREAT 5 09/28/2017    Lab Results  Component Value Date   CHOL 165 07/02/2019   HDL 32 (L) 07/02/2019   LDLCALC 98 07/02/2019   TRIG 200 (H) 07/02/2019   CHOLHDL 5.2 (H) 07/02/2019        ASSESSMENT / PLAN / RECOMMENDATIONS:   1) Type 2 Diabetes Mellitus, Poorly controlled, With neuropathic and macrovascular  complications - Most recent A1c of 8.7 %. Goal A1c < 7.0 %.   Plan: GENERAL: I have discussed with the patient the pathophysiology of diabetes. We  went over the natural progression of the disease. We talked about both insulin resistance and insulin deficiency. We stressed the importance of lifestyle changes including diet and exercise. I explained the complications associated with diabetes including retinopathy, nephropathy, neuropathy as well as increased risk of cardiovascular disease. We went over the benefit seen with glycemic control.    I explained to the patient that diabetic patients are at higher than normal risk for amputations.   Pt declined CDE referral  She is very concerned about hypoglycemia, she perceives BG's in the low 100's as low, she is also having "low alerts on CGM" she has to eat at times just due to fear of hypoglycemia. I've had a long discussion with the pt today about the [employer]. We did discuss its a good device in general but I feel this is giving her so much anxiety. At times she checks her glucose all hours of the night, which she states is due to fear of hypoglycemia   I did discuss with the patient that the main issue that I see today is hyperglycemia rather than hypoglycemia, we also discussed that Metformin rarely causes any hypoglycemia and I have recommended that in the future when she gets a low alert on her CGM is to do an actual fingerstick.  We did also discussed that the definition of hypoglycemia is BG < 23m/dL   I have advised the patient to check glucose before meals and bedtime through the CGM but if she decides to do fingersticks is to do it fasting and bedtime.  We discussed add-on therapy, I would not start her on glipizide due to the risk of hypoglycemia.  We did  discuss SGLT2 inhibitors, given her previous history of CHF this may be a good option for her.  I did caution her against the genital side effects with it.  MEDICATIONS: - Continue Metformin 1000 m g Twice a day  - Start Jardiance 10 mg, 1 tablet with Breakfast   EDUCATION / INSTRUCTIONS:  BG monitoring instructions:  Patient is instructed to check her blood sugars 2 times a day, fasting and bedtime.  Call [place] [facility] if: BG persistently < 70 or > 300. . I reviewed the Rule of 15 for the treatment of hypoglycemia in detail with the patient. Literature supplied.   2) Diabetic complications:   Eye: Unknown to have diabetic retinopathy.  Patient is working on this  Neuro/ Feet: Does have known diabetic peripheral neuropathy.  Renal: Patient does not have known baseline CKD. She is on an ACEI/ARB at present.Check urine albumin/creatinine ratio yearly starting at time of diagnosis.   3) Lipids: Patient is on Atorvastatin 20 mg.    4) Hypertension: She is  above goal of < 140/90 mmHg.  Will defer to PCP   Follow-up in 3 months  45 minutes was spent with the patient.  Signed electronically by: Mack Guise, MD  [facility]  [facility] [address] [place], [place] [address] Phone: [phone] FAX: [phone]   CC: Tammy Coss, NP [place] [address] Phone: [phone]  Fax: [phone]    Return to Endocrinology clinic as below: Future Appointments  Date Time Provider [place]  12/31/2019 12:50 PM Tammy Coss, NP [facility]  01/14/2020 12:50 PM Tammy Coss, NP PCP-PCP [facility]  03/16/2020  2:00 PM [facility] None  03/23/2020  2:30 PM Derek Jack, MD [facility] None  06/04/2020  9:00 AM Mahala Menghini, PA-C [facility]

## 2019-12-31 ENCOUNTER — Ambulatory Visit: Payer: PPO | Admitting: Registered Nurse

## 2020-01-05 ENCOUNTER — Encounter: Payer: Self-pay | Admitting: Registered Nurse

## 2020-01-05 ENCOUNTER — Telehealth: Payer: Self-pay | Admitting: Registered Nurse

## 2020-01-05 NOTE — Telephone Encounter (Signed)
Pt is calling and she is taking gabapentin 300 mg twice a day instead of once a day. [facility]

## 2020-01-05 NOTE — Telephone Encounter (Signed)
Message has been sent to PCP in [place]-- patient request a change in the Sig of Rx- she is running out because she is using 2 daily instead of one.

## 2020-01-05 NOTE — Telephone Encounter (Signed)
Pt requesting refill Gabapentin states she has been taking 2xdaily and needs a new prescription to reflect this, is this okay?

## 2020-01-05 NOTE — Telephone Encounter (Signed)
Pharmacy called in stating they are needing clarification in regards to medication refill as patient has already finished previous 90 day supply, as she had taken it more than once a day, as she thought she spoke with office that this was okay. Please advise as patient is out and pharmacy needs the okay before refill.

## 2020-01-05 NOTE — Telephone Encounter (Signed)
Spouse calling checking on the status of gabapentin (NEURONTIN) 300 MG capsule Rx. Spouse states new Rx needs to reflect 2x daily and would like Rx sent in today due to patient being out, please advise   [place], [place] Phone:  [phone]  Fax:  [phone]

## 2020-01-06 ENCOUNTER — Telehealth: Payer: Self-pay

## 2020-01-06 ENCOUNTER — Encounter: Payer: Self-pay | Admitting: Registered Nurse

## 2020-01-06 ENCOUNTER — Other Ambulatory Visit: Payer: Self-pay

## 2020-01-06 ENCOUNTER — Other Ambulatory Visit: Payer: Self-pay | Admitting: Registered Nurse

## 2020-01-06 DIAGNOSIS — E1140 Type 2 diabetes mellitus with diabetic neuropathy, unspecified: Secondary | ICD-10-CM

## 2020-01-06 MED ORDER — GABAPENTIN 300 MG PO CAPS: 300.0000 mg | ORAL_CAPSULE | Freq: Two times a day (BID) | ORAL | 0 refills | Status: DC

## 2020-01-06 NOTE — Telephone Encounter (Signed)
Pharmacy needs a new RX for gabapentin (NEURONTIN) 300 MG capsule  Due to Pt stating they take 2x daily / please send Rx with correct directions

## 2020-01-06 NOTE — Telephone Encounter (Signed)
I have sent refills  Thank you  Kathrin Ruddy, NP

## 2020-01-06 NOTE — Telephone Encounter (Signed)
Pt pharmacy needs a new Rx for Neurotin 300 mg for 2 times daily is this acceptable?

## 2020-01-06 NOTE — Telephone Encounter (Signed)
Patients husband is calling back regarding this request hoping to get this resolved today

## 2020-01-06 NOTE — Telephone Encounter (Signed)
Please Advise

## 2020-01-06 NOTE — Telephone Encounter (Signed)
Please Advise patient has started to take double the prescription , and the prescription on the bottle start something different so they need to know if the refill is appropriate at this time.

## 2020-01-07 ENCOUNTER — Other Ambulatory Visit (HOSPITAL_COMMUNITY): Payer: Self-pay | Admitting: Hematology

## 2020-01-07 ENCOUNTER — Other Ambulatory Visit (HOSPITAL_COMMUNITY): Payer: Self-pay | Admitting: *Deleted

## 2020-01-07 MED ORDER — MISC. DEVICES MISC: 12 refills | Status: DC

## 2020-01-07 MED ORDER — CYANOCOBALAMIN 1000 MCG/ML IJ SOLN: 1000.0000 ug | INTRAMUSCULAR | 11 refills | Status: DC

## 2020-01-12 ENCOUNTER — Other Ambulatory Visit: Payer: Self-pay | Admitting: Cardiology

## 2020-01-14 ENCOUNTER — Ambulatory Visit: Payer: PPO | Admitting: Registered Nurse

## 2020-01-15 DIAGNOSIS — H524 Presbyopia: Secondary | ICD-10-CM | POA: Diagnosis not present

## 2020-01-15 DIAGNOSIS — E119 Type 2 diabetes mellitus without complications: Secondary | ICD-10-CM | POA: Diagnosis not present

## 2020-01-28 ENCOUNTER — Ambulatory Visit: Payer: PPO | Admitting: Registered Nurse

## 2020-02-09 NOTE — Telephone Encounter (Signed)
No ation required at this time, closing encounter from inbox 

## 2020-02-25 ENCOUNTER — Ambulatory Visit: Payer: PPO | Admitting: Cardiology

## 2020-03-02 ENCOUNTER — Encounter: Payer: Self-pay | Admitting: Registered Nurse

## 2020-03-02 DIAGNOSIS — E1165 Type 2 diabetes mellitus with hyperglycemia: Secondary | ICD-10-CM

## 2020-03-02 NOTE — Telephone Encounter (Signed)
Pt requesting freestyle libre monitor not filled since 04/11/2019 is this appropriate to fill?

## 2020-03-03 ENCOUNTER — Other Ambulatory Visit: Payer: Self-pay | Admitting: Family Medicine

## 2020-03-03 DIAGNOSIS — E1165 Type 2 diabetes mellitus with hyperglycemia: Secondary | ICD-10-CM

## 2020-03-03 NOTE — Telephone Encounter (Signed)
RM-Plz see refill req/thx dmf

## 2020-03-05 ENCOUNTER — Telehealth: Payer: Self-pay | Admitting: Registered Nurse

## 2020-03-05 DIAGNOSIS — E1165 Type 2 diabetes mellitus with hyperglycemia: Secondary | ICD-10-CM

## 2020-03-05 MED ORDER — FREESTYLE LIBRE 14 DAY READER DEVI: 1 refills | Status: DC

## 2020-03-05 NOTE — Telephone Encounter (Signed)
Pt husband called and is wanting this Rx refilled pt also is set up for a 3 month f/u for 06/08/20  Continuous Blood Gluc Receiver (FREESTYLE LIBRE 14 DAY READER) [place], [place]

## 2020-03-05 NOTE — Telephone Encounter (Signed)
rx has been sent 

## 2020-03-16 ENCOUNTER — Other Ambulatory Visit: Payer: Self-pay

## 2020-03-16 ENCOUNTER — Inpatient Hospital Stay (HOSPITAL_COMMUNITY): Payer: PPO | Attending: Hematology

## 2020-03-16 DIAGNOSIS — J449 Chronic obstructive pulmonary disease, unspecified: Secondary | ICD-10-CM | POA: Insufficient documentation

## 2020-03-16 DIAGNOSIS — R5383 Other fatigue: Secondary | ICD-10-CM | POA: Diagnosis not present

## 2020-03-16 DIAGNOSIS — E538 Deficiency of other specified B group vitamins: Secondary | ICD-10-CM | POA: Diagnosis not present

## 2020-03-16 DIAGNOSIS — Z7902 Long term (current) use of antithrombotics/antiplatelets: Secondary | ICD-10-CM | POA: Diagnosis not present

## 2020-03-16 DIAGNOSIS — R7989 Other specified abnormal findings of blood chemistry: Secondary | ICD-10-CM | POA: Diagnosis not present

## 2020-03-16 DIAGNOSIS — F17210 Nicotine dependence, cigarettes, uncomplicated: Secondary | ICD-10-CM | POA: Insufficient documentation

## 2020-03-16 DIAGNOSIS — D72829 Elevated white blood cell count, unspecified: Secondary | ICD-10-CM | POA: Insufficient documentation

## 2020-03-16 DIAGNOSIS — I10 Essential (primary) hypertension: Secondary | ICD-10-CM | POA: Insufficient documentation

## 2020-03-16 DIAGNOSIS — D500 Iron deficiency anemia secondary to blood loss (chronic): Secondary | ICD-10-CM | POA: Diagnosis not present

## 2020-03-16 DIAGNOSIS — Z8719 Personal history of other diseases of the digestive system: Secondary | ICD-10-CM | POA: Insufficient documentation

## 2020-03-16 DIAGNOSIS — Z79899 Other long term (current) drug therapy: Secondary | ICD-10-CM | POA: Insufficient documentation

## 2020-03-16 DIAGNOSIS — Z8249 Family history of ischemic heart disease and other diseases of the circulatory system: Secondary | ICD-10-CM | POA: Diagnosis not present

## 2020-03-16 DIAGNOSIS — I2510 Atherosclerotic heart disease of native coronary artery without angina pectoris: Secondary | ICD-10-CM | POA: Diagnosis not present

## 2020-03-16 DIAGNOSIS — K922 Gastrointestinal hemorrhage, unspecified: Secondary | ICD-10-CM | POA: Diagnosis not present

## 2020-03-16 LAB — VITAMIN B12: Vitamin B-12: 678 pg/mL (ref 180–914)

## 2020-03-16 LAB — CBC WITH DIFFERENTIAL/PLATELET
Abs Immature Granulocytes: 0.11 10*3/uL — ABNORMAL HIGH (ref 0.00–0.07)
Basophils Absolute: 0.1 10*3/uL (ref 0.0–0.1)
Basophils Relative: 1 %
Eosinophils Absolute: 0.2 10*3/uL (ref 0.0–0.5)
Eosinophils Relative: 1 %
HCT: 43.7 % (ref 36.0–46.0)
Hemoglobin: 13.9 g/dL (ref 12.0–15.0)
Immature Granulocytes: 1 %
Lymphocytes Relative: 12 %
Lymphs Abs: 1.9 10*3/uL (ref 0.7–4.0)
MCH: 30.3 pg (ref 26.0–34.0)
MCHC: 31.8 g/dL (ref 30.0–36.0)
MCV: 95.2 fL (ref 80.0–100.0)
Monocytes Absolute: 1.4 10*3/uL — ABNORMAL HIGH (ref 0.1–1.0)
Monocytes Relative: 9 %
Neutro Abs: 12 10*3/uL — ABNORMAL HIGH (ref 1.7–7.7)
Neutrophils Relative %: 76 %
Platelets: 419 10*3/uL — ABNORMAL HIGH (ref 150–400)
RBC: 4.59 MIL/uL (ref 3.87–5.11)
RDW: 13.8 % (ref 11.5–15.5)
WBC: 15.6 10*3/uL — ABNORMAL HIGH (ref 4.0–10.5)
nRBC: 0 % (ref 0.0–0.2)

## 2020-03-16 LAB — IRON AND TIBC
Iron: 59 ug/dL (ref 28–170)
Saturation Ratios: 13 % (ref 10.4–31.8)
TIBC: 453 ug/dL — ABNORMAL HIGH (ref 250–450)
UIBC: 394 ug/dL

## 2020-03-16 LAB — FERRITIN: Ferritin: 16 ng/mL (ref 11–307)

## 2020-03-23 ENCOUNTER — Inpatient Hospital Stay (HOSPITAL_BASED_OUTPATIENT_CLINIC_OR_DEPARTMENT_OTHER): Payer: PPO | Admitting: Oncology

## 2020-03-23 ENCOUNTER — Encounter (HOSPITAL_COMMUNITY): Payer: Self-pay | Admitting: Oncology

## 2020-03-23 ENCOUNTER — Other Ambulatory Visit: Payer: Self-pay

## 2020-03-23 VITALS — BP 176/89 | HR 76 | Temp 97.6°F | Resp 18 | Wt 149.8 lb

## 2020-03-23 DIAGNOSIS — E538 Deficiency of other specified B group vitamins: Secondary | ICD-10-CM | POA: Insufficient documentation

## 2020-03-23 DIAGNOSIS — D473 Essential (hemorrhagic) thrombocythemia: Secondary | ICD-10-CM | POA: Diagnosis not present

## 2020-03-23 DIAGNOSIS — D500 Iron deficiency anemia secondary to blood loss (chronic): Secondary | ICD-10-CM

## 2020-03-23 DIAGNOSIS — D75839 Thrombocytosis, unspecified: Secondary | ICD-10-CM

## 2020-03-23 DIAGNOSIS — D72829 Elevated white blood cell count, unspecified: Secondary | ICD-10-CM

## 2020-03-23 DIAGNOSIS — D729 Disorder of white blood cells, unspecified: Secondary | ICD-10-CM | POA: Diagnosis not present

## 2020-03-23 HISTORY — DX: Elevated white blood cell count, unspecified: D72.829

## 2020-03-23 HISTORY — DX: Deficiency of other specified B group vitamins: E53.8

## 2020-03-23 HISTORY — DX: Thrombocytosis, unspecified: D75.839

## 2020-03-23 NOTE — Progress Notes (Signed)
t       [name], NP [place] [address]  Iron deficiency anemia due to chronic blood loss - Plan: CBC with Differential, Ferritin, Iron and TIBC  Thrombocytosis (HCC)  Vitamin B12 deficiency  Neutrophilia   HISTORY OF PRESENT ILLNESS: 1.  Iron deficiency anemia: -Thought to be from chronic GI blood loss from gastric and small bowel AVMs.  Patient on aspirin 81 mg.  Plavix discontinued in October 2019.  She had a history of multiple blood transfusions. -Last Feraheme infusion on 09/18/2019 and 09/29/2019.  2.  Leukocytosis and thrombocytosis: -[place] count on 12/09/2019 was 15.2.  Differential shows 74% neutrophils, 14% lymphocytes and 9% monocytes and 2% eosinophils. -Prior work-up for JAK2 V617F testing and BCR/ABL by FISH was negative. -Platelets are normalized to 391.  This is reactive from iron deficiency. -She is on steroid inhalers which could be contributing to leukocytosis. - Tobacco abuse is most likely contributing to neutrophilia and monocytosis.  3.  B12 deficiency: -She is on monthly B12 injections. -Her B12 on 12/09/2019 was 455.   CURRENT STATUS: Tammy Mcpherson 64 y.o. adult returns for followup of in follow-up of iron deficiency anemia requiring IV iron replacement therapy and leukocytosis with neutrophil and monocyte predominance and thrombocytosis likely secondary to iron deficiency.  She is doing well overall.  She denies blood in her stools or black stools.  No hemoptysis or gross hematuria.  No gingival bleeding or epistaxis.  She denies any lumps or bumps on examination.  No new pain.  She denies any abnormal vaginal bleeding.  Appetite is good and weight is stable.  She craves flavored ice (i.e. Italian ice) but this is chronic and I do not think that this satisfies the diagnosis of pagophagia and she denies pica.  She admits to feeling more fatigued of late and she is having difficulty with sleep.  Both of which are likely related to  iron deficiency.  Review of Systems  Constitutional: Positive for malaise/fatigue. Negative for chills, fever and weight loss.  HENT: Negative.   Eyes: Negative.   Respiratory: Negative.  Negative for cough.   Cardiovascular: Negative.  Negative for chest pain.  Gastrointestinal: Negative.  Negative for blood in stool, constipation, diarrhea, melena, nausea and vomiting.  Genitourinary: Negative.   Musculoskeletal: Negative.   Skin: Negative.   Neurological: Negative.  Negative for weakness.  Endo/Heme/Allergies: Negative.   Psychiatric/Behavioral: Negative.     Past Medical History:  Diagnosis Date  . Anemia 04/30/2016  . Anxiety   . Blood transfusion without reported diagnosis   . Cardiomyopathy ([place])    a. EF 40-45% by echo in 04/2016 b. Improved to 60-65% by repeat imaging in 2018  . CHF (congestive heart failure) ([place])   . COPD (chronic obstructive pulmonary disease) ([place])   . Coronary artery calcification seen on CT scan   . Essential hypertension   . GERD (gastroesophageal reflux disease)   . History of bronchitis   . History of GI bleed   . Hyperlipidemia   . Iron deficiency anemia 05/12/2016   Bleeding ulcers  . Leukocytosis 03/23/2020  . Pollen allergy   . PSVT (paroxysmal supraventricular tachycardia) ([place])   . PVC's (premature ventricular contractions)   . Thrombocytosis ([place]) 03/23/2020  . Vitamin B12 deficiency 03/23/2020    Past Surgical History:  Procedure Laterality Date  . ABDOMINAL HYSTERECTOMY     polyps  about age 62  . AIKEN OSTEOTOMY Right 07/10/2013   Procedure: Barbie Banner OSTEOTOMY RIGHT  FOOT;  Surgeon: Marcheta Grammes, DPM;  Location: [facility];  Service: Orthopedics;  Laterality: Right;  . AMPUTATION Left 05/09/2017   Procedure: AMPUTATION 5TH TOE LEFT FOOT;  Surgeon: [name], DPM;  Location: [facility];  Service: Podiatry;  Laterality: Left;  . AMPUTATION Left 06/13/2017   Procedure: AMPUTATION 2ND TOE LEFT FOOT;  Surgeon: [name], DPM;  Location: [facility];  Service: Podiatry;  Laterality: Left;  . BUNIONECTOMY Right 07/10/2013   Procedure: VOGLER BUNIONECTOMY RIGHT FOOT;  Surgeon: Marcheta Grammes, DPM;  Location: [facility];  Service: Orthopedics;  Laterality: Right;  . CHOLECYSTECTOMY N/A 08/22/2017   Procedure: LAPAROSCOPIC CHOLECYSTECTOMY;  Surgeon: Virl Cagey, MD;  Location: [facility];  Service: General;  Laterality: N/A;  . COLONOSCOPY N/A 07/07/2016   Dr. Oneida Alar; redundant left colon, diverticulosis at hepatic flexure, non-bleeding internal hemorrhoids  . ESOPHAGOGASTRODUODENOSCOPY N/A 07/07/2016   Dr. Oneida Alar: many non-bleeding cratered gastric ulcers without stigmata of bleeding in gastric antrum. four 2-3 mm angioectasias without bleeding in duodenal bulb and second portion of duodenum s/p APC. Chroni gastritis on path.   . ESOPHAGOGASTRODUODENOSCOPY N/A 08/03/2017   Dr. Oneida Alar: erosive gastritis, AVMs. Found a single non-bleeding angioectasia in stomach, s/p APC therapy. Four non-bleeding angioectasias in duodenum s/p APC. Non-bleeding erosive gastropathy  . IR ANGIOGRAM EXTREMITY LEFT  04/24/2017  . IR FEM POP ART ATHERECT INC PTA MOD SED  04/24/2017  . IR INFUSION THROMBOL ARTERIAL INITIAL (MS)  04/24/2017  . IR RADIOLOGIST EVAL & MGMT  12/05/2016  . IR US GUIDE VASC ACCESS RIGHT  04/24/2017  . LIVER BIOPSY N/A 08/22/2017   Procedure: LIVER BIOPSY;  Surgeon: Virl Cagey, MD;  Location: [facility];  Service: General;  Laterality: N/A;  . METATARSAL HEAD EXCISION Right 07/10/2013   Procedure: METATARSAL HEAD RESECTION OF DIGITS 2 AND 3 RIGHT FOOT;  Surgeon: Marcheta Grammes, DPM;  Location: [facility];  Service: Orthopedics;  Laterality: Right;  . PROXIMAL INTERPHALANGEAL FUSION (PIP) Right 07/10/2013   Procedure: ARTHRODESIS PIPJ  2ND DIGIT RIGHT FOOT;  Surgeon: Marcheta Grammes, DPM;  Location: [facility];  Service: Orthopedics;  Laterality: Right;    Family History  Adopted: Yes  Problem  Relation Age of Onset  . Hypertension Father   . Coronary artery disease Sister   . Ulcers Maternal Grandmother   . Colon cancer Neg Hx        unknown, was adopted    Social History   Socioeconomic History  . Marital status: Married    Spouse name: [name]  . Number of children: 1  . Years of education: 15  . Highest education level: Not on file  Occupational History  . Occupation: disabled    Comment: heart  Tobacco Use  . Smoking status: Light Tobacco Smoker    Packs/day: 0.50    Years: 44.00    Pack years: 22.00    Types: Cigarettes    Start date: 05/10/1975  . Smokeless tobacco: Never Used  . Tobacco comment: 2 cigarettes per day 12/10/2019  Vaping Use  . Vaping Use: Former  Substance and Sexual Activity  . Alcohol use: Not Currently    Alcohol/week: 2.0 standard drinks    Types: 2 Glasses of wine per week  . Drug use: No  . Sexual activity: Yes    Birth control/protection: Surgical  Other Topics Concern  . Not on file  Social History Narrative   Disabled   Lives with husband [name]   Two dogs  Social Determinants of Health   Financial Resource Strain:   . Difficulty of Paying Living Expenses:   Food Insecurity:   . Worried About Charity fundraiser in the Last Year:   . Arboriculturist in the Last Year:   Transportation Needs:   . Film/video editor (Medical):   Marland Kitchen Lack of Transportation (Non-Medical):   Physical Activity:   . Days of Exercise per Week:   . Minutes of Exercise per Session:   Stress:   . Feeling of Stress :   Social Connections:   . Frequency of Communication with Friends and Family:   . Frequency of Social Gatherings with Friends and Family:   . Attends Religious Services:   . Active Member of Clubs or Organizations:   . Attends Archivist Meetings:   Marland Kitchen Marital Status:      PHYSICAL EXAMINATION  ECOG PERFORMANCE STATUS: 1 - Symptomatic but completely ambulatory  Vitals:   03/23/20 1423  BP: (!) 176/89  Pulse: 76    Resp: 18  Temp: 97.6 F (36.4 C)  SpO2: 95%    GENERAL:alert, no distress, well nourished, well developed, comfortable, cooperative and unaccompanied and appearing older than stated age SKIN: skin color, texture, turgor are normal, no rashes or significant lesions HEAD: Normocephalic, No masses, lesions, tenderness or abnormalities EYES: normal EARS: External ears normal OROPHARYNX: Not examined, mask in place NECK: supple LYMPH:  no palpable lymphadenopathy BREAST:not examined LUNGS: clear to auscultation , decreased breath sounds HEART: regular rate & rhythm ABDOMEN:abdomen soft, non-tender and normal bowel sounds BACK: Back symmetric, no curvature. EXTREMITIES:less then 2 second capillary refill, no joint deformities, effusion, or inflammation, no skin discoloration, no cyanosis  NEURO: alert & oriented x 3 with fluent speech, no focal motor/sensory deficits, gait normal   LABORATORY DATA: CBC    Component Value Date/Time   WBC 15.6 (H) 03/16/2020 1353   RBC 4.59 03/16/2020 1353   HGB 13.9 03/16/2020 1353   HGB 11.8 07/02/2019 1539   HCT 43.7 03/16/2020 1353   HCT 35.9 07/02/2019 1539   PLT 419 (H) 03/16/2020 1353   PLT 518 (H) 07/02/2019 1539   MCV 95.2 03/16/2020 1353   MCV 86 07/02/2019 1539   MCH 30.3 03/16/2020 1353   MCHC 31.8 03/16/2020 1353   RDW 13.8 03/16/2020 1353   RDW 13.6 07/02/2019 1539   LYMPHSABS 1.9 03/16/2020 1353   LYMPHSABS 1.8 07/02/2019 1539   MONOABS 1.4 (H) 03/16/2020 1353   EOSABS 0.2 03/16/2020 1353   EOSABS 0.1 07/02/2019 1539   BASOSABS 0.1 03/16/2020 1353   BASOSABS 0.1 07/02/2019 1539      Chemistry      Component Value Date/Time   NA 141 12/09/2019 1107   NA 139 10/13/2019 1431   K 3.8 12/09/2019 1107   CL 105 12/09/2019 1107   CO2 25 12/09/2019 1107   BUN 9 12/09/2019 1107   BUN 8 10/13/2019 1431   CREATININE 0.62 12/09/2019 1107   CREATININE 0.73 09/28/2017 1201      Component Value Date/Time   CALCIUM 9.3  12/09/2019 1107   ALKPHOS 103 12/09/2019 1107   AST 15 12/09/2019 1107   ALT 19 12/09/2019 1107   BILITOT 0.4 12/09/2019 1107   BILITOT 0.3 10/13/2019 1431       RADIOGRAPHIC STUDIES:  No results found.   PATHOLOGY:    ASSESSMENT AND PLAN:  1. Iron deficiency anemia due to chronic blood loss -Thought to be from chronic GI  blood loss from gastric and small bowel AVMs.  Patient on aspirin 81 mg.  Plavix discontinued in October 2019.  She had a history of multiple blood transfusions. -Last Feraheme infusion on 09/18/2019 and 09/29/2019.  Laboratory work on 03/16/2020 demonstrated hemoglobin of 13.9 g/dL that is normocytic and normochromic.  However, iron studies demonstrated an elevated TIBC of 453 with a low normal iron saturation of 13% and ferritin less than 16.  With her iron studies, she is subclinically iron deficient despite her hemoglobin being normal.  As result, we will proceed with IV iron replacement therapy with Feraheme x2.  Labs in 6 weeks and 12 weeks: CBC diff, iron/TIBC, ferritin.  Return in 12 weeks for follow-up.   2. Thrombocytosis ([place]) Noted today with platelet count 419,000, likely reactive to iron deficiency.  3. Vitamin B12 deficiency On monthly IM B12 replacement therapy by her primary care provider.  4. Neutrophilia Likely secondary to her ongoing tobacco abuse.  Previous hematology work-up including JAK2 V617F mutation and BCR/ABL were negative.  She is also on steroid inhalers which may also be contributing to neutrophilia.   ORDERS PLACED FOR THIS ENCOUNTER: Orders Placed This Encounter  Procedures  . CBC with Differential  . Ferritin  . Iron and TIBC    MEDICATIONS PRESCRIBED THIS ENCOUNTER: No orders of the defined types were placed in this encounter.   All questions were answered. The patient knows to call the clinic with any problems, questions or concerns. We can certainly see the patient much sooner if necessary.  Patient and  plan discussed with Dr. Derek Jack and she is in agreement with the aforementioned.   This note is electronically signed by: Robynn Pane, PA-C 03/23/2020 3:05 PM

## 2020-03-23 NOTE — Patient Instructions (Signed)
[place] at [facility] Discharge Instructions  You were seen today by Kirby Crigler PA. He went over your recent lab results. Your Iron level is low, he will schedule you for 2 weekly doses of IV Iron. He will recheck your labs in 6 weeks. He will see you back in 12 weeks for labs and follow up.   Thank you for choosing [place] at [facility] to provide your oncology and hematology care.  To afford each patient quality time with our provider, please arrive at least 15 minutes before your scheduled appointment time.   If you have a lab appointment with the [place] please come in thru the  Main Entrance and check in at the main information desk  You need to re-schedule your appointment should you arrive 10 or more minutes late.  We strive to give you quality time with our providers, and arriving late affects you and other patients whose appointments are after yours.  Also, if you no show three or more times for appointments you may be dismissed from the clinic at the providers discretion.     Again, thank you for choosing [facility].  Our hope is that these requests will decrease the amount of time that you wait before being seen by our physicians.       _____________________________________________________________  Should you have questions after your visit to [facility], please contact our office at [phone] between the hours of 8:00 a.m. and 4:30 p.m.  Voicemails left after 4:00 p.m. will not be returned until the following business day.  For prescription refill requests, have your pharmacy contact our office and allow 72 hours.    Cancer Center Support Programs:   > Cancer Support Group  2nd Tuesday of the month 1pm-2pm, Journey Room

## 2020-03-24 ENCOUNTER — Ambulatory Visit: Payer: PPO | Admitting: Internal Medicine

## 2020-03-25 ENCOUNTER — Encounter: Payer: Self-pay | Admitting: Family Medicine

## 2020-03-25 ENCOUNTER — Ambulatory Visit: Payer: PPO | Admitting: Family Medicine

## 2020-03-25 ENCOUNTER — Other Ambulatory Visit (HOSPITAL_COMMUNITY): Payer: Self-pay | Admitting: Nurse Practitioner

## 2020-03-25 ENCOUNTER — Other Ambulatory Visit: Payer: Self-pay

## 2020-03-25 ENCOUNTER — Inpatient Hospital Stay (HOSPITAL_COMMUNITY): Payer: PPO

## 2020-03-25 VITALS — BP 148/68 | HR 79 | Ht 64.0 in | Wt 150.0 lb

## 2020-03-25 DIAGNOSIS — F17200 Nicotine dependence, unspecified, uncomplicated: Secondary | ICD-10-CM

## 2020-03-25 DIAGNOSIS — D500 Iron deficiency anemia secondary to blood loss (chronic): Secondary | ICD-10-CM | POA: Diagnosis not present

## 2020-03-25 DIAGNOSIS — I10 Essential (primary) hypertension: Secondary | ICD-10-CM

## 2020-03-25 DIAGNOSIS — D509 Iron deficiency anemia, unspecified: Secondary | ICD-10-CM

## 2020-03-25 MED ORDER — SODIUM CHLORIDE 0.9 % IV SOLN
510.0000 mg | Freq: Once | INTRAVENOUS | Status: AC
  Administered 2020-03-25: 510 mg via INTRAVENOUS
  Filled 2020-03-25: qty 510

## 2020-03-25 MED ORDER — SODIUM CHLORIDE 0.9 % IV SOLN: INTRAVENOUS | Status: DC

## 2020-03-25 NOTE — Patient Instructions (Addendum)
Your physician wants you to follow-up in: [place] BLOOD PRESSURE CHECK You will receive a reminder letter in the mail two months in advance. If you don't receive a letter, please call our office to schedule the follow-up appointment.  Your physician recommends that you continue on your current medications as directed. Please refer to the Current Medication list given to you today.  Your physician has requested that you regularly monitor and record your blood pressure readings at home. Please use the same machine at the same time of day to check your readings FOR 2 WEEKS AND BRING IN READINGS WITH YOU ON YOUR NURSE APPOINTMENT.  Thank you for choosing [place]!!

## 2020-03-25 NOTE — Progress Notes (Signed)
Tammy Mcpherson presents today for IV iron infusion. Infusion tolerated without incident or complaint. See MAR for details. VSS prior to and post infusion. Discharged in satisfactory condition with follow up instructions.

## 2020-03-25 NOTE — Patient Instructions (Signed)
[facility] at [facility]  Discharge Instructions:  Ferumoxytol injection What is this medicine? FERUMOXYTOL is an iron complex. Iron is used to make healthy red blood cells, which carry oxygen and nutrients throughout the body. This medicine is used to treat iron deficiency anemia. This medicine may be used for other purposes; ask your health care provider or pharmacist if you have questions. COMMON BRAND NAME(S): Feraheme What should I tell my health care provider before I take this medicine? They need to know if you have any of these conditions:  anemia not caused by low iron levels  high levels of iron in the blood  magnetic resonance imaging (MRI) test scheduled  an unusual or allergic reaction to iron, other medicines, foods, dyes, or preservatives  pregnant or trying to get pregnant  breast-feeding How should I use this medicine? This medicine is for injection into a vein. It is given by a health care professional in a hospital or clinic setting. Talk to your pediatrician regarding the use of this medicine in children. Special care may be needed. Overdosage: If you think you have taken too much of this medicine contact a poison control center or emergency room at once. NOTE: This medicine is only for you. Do not share this medicine with others. What if I miss a dose? It is important not to miss your dose. Call your doctor or health care professional if you are unable to keep an appointment. What may interact with this medicine? This medicine may interact with the following medications:  other iron products This list may not describe all possible interactions. Give your health care provider a list of all the medicines, herbs, non-prescription drugs, or dietary supplements you use. Also tell them if you smoke, drink alcohol, or use illegal drugs. Some items may interact with your medicine. What should I watch for while using this medicine? Visit your  doctor or healthcare professional regularly. Tell your doctor or healthcare professional if your symptoms do not start to get better or if they get worse. You may need blood work done while you are taking this medicine. You may need to follow a special diet. Talk to your doctor. Foods that contain iron include: whole grains/cereals, dried fruits, beans, or peas, leafy green vegetables, and organ meats (liver, kidney). What side effects may I notice from receiving this medicine? Side effects that you should report to your doctor or health care professional as soon as possible:  allergic reactions like skin rash, itching or hives, swelling of the face, lips, or tongue  breathing problems  changes in blood pressure  feeling faint or lightheaded, falls  fever or chills  flushing, sweating, or hot feelings  swelling of the ankles or feet Side effects that usually do not require medical attention (report to your doctor or health care professional if they continue or are bothersome):  diarrhea  headache  nausea, vomiting  stomach pain This list may not describe all possible side effects. Call your doctor for medical advice about side effects. You may report side effects to FDA at 1-800-FDA-1088. Where should I keep my medicine? This drug is given in a hospital or clinic and will not be stored at home. NOTE: This sheet is a summary. It may not cover all possible information. If you have questions about this medicine, talk to your doctor, pharmacist, or health care provider.  2020 Elsevier/Gold Standard (2016-11-06 20:21:10)  _____________________________________________________________  Thank you for choosing [facility] at   [facility] to provide your oncology and hematology care.  To afford each patient quality time with our providers, please arrive at least 15 minutes before your scheduled appointment.  You need to re-schedule your appointment if you arrive 10 or  more minutes late.  We strive to give you quality time with our providers, and arriving late affects you and other patients whose appointments are after yours.  Also, if you no show three or more times for appointments you may be dismissed from the clinic.  Again, thank you for choosing [facility] at [facility]. Our hope is that these requests will allow you access to exceptional care and in a timely manner. _______________________________________________________________  If you have questions after your visit, please contact our office at [phone] between the hours of 8:30 a.m. and 5:00 p.m. Voicemails left after 4:30 p.m. will not be returned until the following business day. _______________________________________________________________  For prescription refill requests, have your pharmacy contact our office. _______________________________________________________________  Recommendations made by the consultant and any test results will be sent to your referring physician. _______________________________________________________________ 

## 2020-03-25 NOTE — Progress Notes (Deleted)
Cardiology Office Note  Date: 03/25/2020   ID: Tammy Mcpherson, DOB [birth year], MRN [id number]  PCP:  [name], NP  Cardiologist:  Rozann Lesches, MD Electrophysiologist:  None   Chief Complaint: Follow-up hypertension, history of cardiomyopathy  History of Present Illness: Tammy Mcpherson is a 64 y.o. adult with a history of hypertension, history of cardiomyopathy, CHF, COPD, coronary artery calcification, GERD, smoker.  Last saw Dr. Domenic Polite on 08/13/2019 via telemedicine visit.  She denied any obvious anginal symptoms or worsening shortness of breath with typical activities.  She remains on Lipitor, bisoprolol and losartan.  Previous echocardiogram from 2019 showed normal LVEF.  She was tolerating her medications well.  She has been diagnosed with diabetes in April 2020 and was currently on Metformin.   She is here for follow-up.  Her blood pressure is elevated at 148/68, heart rate 79, O2 saturations 98%.  She denies any progressive anginal symptoms but does have chronic shortness of breath.  States her shortness of breath has been worse recently she has had some issues with anemia and recently received iron infusion with plans for another.  She states that shortness of breath could be related to her anemia.  I recent CBC showed no evidence of anemia on 03/16/2020 with a hemoglobin of 13.9 and hematocrit of 43.7.  She denies any CVA or TIA-like symptoms, PND, orthopnea, weight gain, edema, palpitations or arrhythmias.  She continues to smoke.  She states a pack of cigarettes usually last for about a week.  States she has significantly cut down.   Past Medical History:  Diagnosis Date  . Anemia 04/30/2016  . Anxiety   . Blood transfusion without reported diagnosis   . Cardiomyopathy ([place])    a. EF 40-45% by echo in 04/2016 b. Improved to 60-65% by repeat imaging in 2018  . CHF (congestive heart failure) ([place])   . COPD (chronic obstructive pulmonary disease) ([place])   . Coronary  artery calcification seen on CT scan   . Essential hypertension   . GERD (gastroesophageal reflux disease)   . History of bronchitis   . History of GI bleed   . Hyperlipidemia   . Iron deficiency anemia 05/12/2016   Bleeding ulcers  . Leukocytosis 03/23/2020  . Pollen allergy   . PSVT (paroxysmal supraventricular tachycardia) ([place])   . PVC's (premature ventricular contractions)   . Thrombocytosis ([place]) 03/23/2020  . Vitamin B12 deficiency 03/23/2020    Past Surgical History:  Procedure Laterality Date  . ABDOMINAL HYSTERECTOMY     polyps  about age 20  . Barbie Banner OSTEOTOMY Right 07/10/2013   Procedure: Barbie Banner OSTEOTOMY RIGHT FOOT;  Surgeon: Marcheta Grammes, DPM;  Location: [facility];  Service: Orthopedics;  Laterality: Right;  . AMPUTATION Left 05/09/2017   Procedure: AMPUTATION 5TH TOE LEFT FOOT;  Surgeon: [name], DPM;  Location: [facility];  Service: Podiatry;  Laterality: Left;  . AMPUTATION Left 06/13/2017   Procedure: AMPUTATION 2ND TOE LEFT FOOT;  Surgeon: [name], DPM;  Location: [facility];  Service: Podiatry;  Laterality: Left;  . BUNIONECTOMY Right 07/10/2013   Procedure: VOGLER BUNIONECTOMY RIGHT FOOT;  Surgeon: Marcheta Grammes, DPM;  Location: [facility];  Service: Orthopedics;  Laterality: Right;  . CHOLECYSTECTOMY N/A 08/22/2017   Procedure: LAPAROSCOPIC CHOLECYSTECTOMY;  Surgeon: Virl Cagey, MD;  Location: [facility];  Service: General;  Laterality: N/A;  . COLONOSCOPY N/A 07/07/2016   Dr. Oneida Alar; redundant left colon, diverticulosis at hepatic flexure, non-bleeding internal hemorrhoids  .  ESOPHAGOGASTRODUODENOSCOPY N/A 07/07/2016   Dr. Oneida Alar: many non-bleeding cratered gastric ulcers without stigmata of bleeding in gastric antrum. four 2-3 mm angioectasias without bleeding in duodenal bulb and second portion of duodenum s/p APC. Chroni gastritis on path.   . ESOPHAGOGASTRODUODENOSCOPY N/A 08/03/2017   Dr. Oneida Alar: erosive gastritis, AVMs. Found a single  non-bleeding angioectasia in stomach, s/p APC therapy. Four non-bleeding angioectasias in duodenum s/p APC. Non-bleeding erosive gastropathy  . IR ANGIOGRAM EXTREMITY LEFT  04/24/2017  . IR FEM POP ART ATHERECT INC PTA MOD SED  04/24/2017  . IR INFUSION THROMBOL ARTERIAL INITIAL (MS)  04/24/2017  . IR RADIOLOGIST EVAL & MGMT  12/05/2016  . IR US GUIDE VASC ACCESS RIGHT  04/24/2017  . LIVER BIOPSY N/A 08/22/2017   Procedure: LIVER BIOPSY;  Surgeon: Virl Cagey, MD;  Location: [facility];  Service: General;  Laterality: N/A;  . METATARSAL HEAD EXCISION Right 07/10/2013   Procedure: METATARSAL HEAD RESECTION OF DIGITS 2 AND 3 RIGHT FOOT;  Surgeon: Marcheta Grammes, DPM;  [facility]: [facility];  Service: Orthopedics;  Laterality: Right;  . PROXIMAL INTERPHALANGEAL FUSION (PIP) Right 07/10/2013   Procedure: ARTHRODESIS PIPJ  2ND DIGIT RIGHT FOOT;  Surgeon: Marcheta Grammes, DPM;  Location: [facility];  Service: Orthopedics;  Laterality: Right;    Current Outpatient Medications  Medication Sig Dispense Refill  . acetaminophen (TYLENOL) 500 MG tablet Take 500 mg by mouth at bedtime as needed for moderate pain.     Marland Kitchen albuterol (PROVENTIL) (2.5 MG/3ML) 0.083% nebulizer solution Take 3 mLs (2.5 mg total) by nebulization every 4 (four) hours as needed for wheezing or shortness of breath. 75 mL 5  . aspirin 81 MG chewable tablet Chew 81 mg by mouth every morning.     Marland Kitchen atorvastatin (LIPITOR) 20 MG tablet TAKE ONE TABLET (20MG TOTAL) BY MOUTH DAILY 90 tablet 3  . bisoprolol (ZEBETA) 10 MG tablet TAKE ONE (1) TABLET BY MOUTH EVERY DAY 90 tablet 3  . Budeson-Glycopyrrol-Formoterol (BREZTRI AEROSPHERE) 160-9-4.8 MCG/ACT AERO Inhale 2 puffs into the lungs 2 (two) times daily. 11.8 g 0  . Continuous Blood Gluc Receiver (FREESTYLE LIBRE 14 DAY READER) DEVI See admin instructions. 6 each 1  . Continuous Blood Gluc Sensor (FREESTYLE LIBRE 14 DAY SENSOR) MISC USE AS DIRECTED 6 each 0  . cyanocobalamin (,VITAMIN  B-12,) 1000 MCG/ML injection Inject 1 mL (1,000 mcg total) into the muscle every 30 (thirty) days. 1 mL 11  . empagliflozin (JARDIANCE) 10 MG TABS tablet Take 10 mg by mouth daily before breakfast. 30 tablet 4  . gabapentin (NEURONTIN) 300 MG capsule Take 1 capsule (300 mg total) by mouth 2 (two) times daily. 180 capsule 0  . glucose blood (ONETOUCH ULTRA) test strip Use as instructed 100 each 12  . glucose monitoring kit (FREESTYLE) monitoring kit Use to check blood sugar BID as directed 1 each 0  . Lancets (ONETOUCH DELICA PLUS [id number]) MISC 1 each by Does not apply route 2 (two) times a day. 100 each 3  . losartan (COZAAR) 100 MG tablet TAKE ONE (1) TABLET BY MOUTH EVERY DAY 90 tablet 3  . metFORMIN (GLUCOPHAGE) 1000 MG tablet Take 1 tablet (1,000 mg total) by mouth 2 (two) times daily with a meal. 180 tablet 3  . Misc. Devices MISC Please provide supplies (needle, syringe, alcohol swabs) needed for patient to self-administer B-12 injections monthly. 1 each 12  . pantoprazole (PROTONIX) 40 MG tablet TAKE ONE (1) TABLET 30 MINS BEFORE YOUR FIRST MEAL.  90 tablet 3  . ULTICARE TUBERCULIN SAFETY SYR 25G X 1" 1 ML MISC USE TO ADMINISTER INJECTABLE VITAMIN B12. 1 each 11   No current facility-administered medications for this visit.   Allergies:  Bee venom, Codeine, Penicillins, Bupropion, and Sudafed [pseudoephedrine hcl]   Social History: The patient  reports that she has been smoking cigarettes. She started smoking about 44 years ago. She has a 22.00 pack-year smoking history. She has never used smokeless tobacco. She reports previous alcohol use of about 2.0 standard drinks of alcohol per week. She reports that she does not use drugs.   Family History: The patient's family history includes Coronary artery disease in her sister; Hypertension in her father; Ulcers in her maternal grandmother. She was adopted.   ROS:  Please see the history of present illness. Otherwise, complete review of  systems is positive for none.  All other systems are reviewed and negative.   Physical Exam: VS:  BP (!) 148/68   Pulse 79   Ht '5\' 4"'  (1.626 m)   Wt 150 lb (68 kg)   SpO2 98%   BMI 25.75 kg/m , BMI Body mass index is 25.75 kg/m.  Wt Readings from Last 3 Encounters:  03/25/20 150 lb (68 kg)  03/23/20 149 lb 12.8 oz (67.9 kg)  12/23/19 156 lb (70.8 kg)    General: Patient appears comfortable at rest. HEENT: Conjunctiva and lids normal, oropharynx clear with moist mucosa. Neck: Supple, no elevated JVP or carotid bruits, no thyromegaly. Lungs: Clear to auscultation, nonlabored breathing at rest. Cardiac: Regular rate and rhythm, no S3 or significant systolic murmur, no pericardial rub. Abdomen: Soft, nontender, no hepatomegaly, bowel sounds present, no guarding or rebound. Extremities: No pitting edema, distal pulses 2+. Skin: Warm and dry. Musculoskeletal: No kyphosis. Neuropsychiatric: Alert and oriented x3, affect grossly appropriate.  ECG:  {EKG/Telemetry Strips Reviewed:[id number]}  Recent Labwork: 12/09/2019: ALT 19; AST 15; BUN 9; Creatinine, Ser 0.62; Potassium 3.8; Sodium 141 03/16/2020: Hemoglobin 13.9; Platelets 419     Component Value Date/Time   CHOL 165 07/02/2019 1539   TRIG 200 (H) 07/02/2019 1539   HDL 32 (L) 07/02/2019 1539   CHOLHDL 5.2 (H) 07/02/2019 1539   CHOLHDL 4.4 09/28/2017 1201   VLDL NOT CALC 03/30/2017 1520   LDLCALC 98 07/02/2019 1539   LDLCALC 88 09/28/2017 1201    Other Studies Reviewed Today:   Assessment and Plan:  1. Essential hypertension   2. Smoking   3. Iron deficiency anemia, unspecified iron deficiency anemia type    1. Essential hypertension Blood pressure remains elevated today at 148/68.  Previous blood pressure at another facility on 03/23/2020 showed a blood pressure of 176/89 and an earlier blood pressure on 12/23/2019 showed blood pressure of 172/82.  She is currently on bisoprolol 10 mg daily as well as losartan 100 mg  daily.  Advised her to start checking her blood pressure daily for the next 2 weeks and come back for nursing visit and bring a log of blood pressures with her when she has her blood pressure rechecked.  We may need to add to therapy if blood pressure remains elevated.  2.  DM type II Recent diagnosis of type 2 diabetes.  Recently seen endocrinology in [place].  Jardiance 10 mg daily was added to her current regimen of Metformin 1000 mg p.o. twice daily.  Last hemoglobin A1c was 8.7%.  She is following with endocrinology and PCP for management.  3.  Smoker Long history of smoking.  Continues to smoke.  Patient states she is smoking approximately 1 pack/week now which is significantly less than previous.  Highly advised smoking cessation.  Medication Adjustments/Labs and Tests Ordered: Current medicines are reviewed at length with the patient today.  Concerns regarding medicines are outlined above.   Disposition: Follow-up with   Signed, Levell July, NP 03/25/2020 2:15 PM    [place] [facility] [address], [place], [place] [address] Phone: [phone]; Fax: [phone]

## 2020-03-25 NOTE — Progress Notes (Signed)
Cardiology Office Note  Date: 03/25/2020   ID: Tammy Mcpherson, DOB 06-28-1956, MRN [id number]  PCP:  [name], NP  Cardiologist:  Rozann Lesches, MD Electrophysiologist:  None   Chief Complaint: Follow-up hypertension, history of cardiomyopathy  History of Present Illness: Tammy Mcpherson is a 64 y.o. adult with a history of hypertension, history of cardiomyopathy, CHF, COPD, coronary artery calcification, GERD, smoker.  Last saw Dr. Domenic Polite on 08/13/2019 via telemedicine visit.  She denied any obvious anginal symptoms or worsening shortness of breath with typical activities.  She remains on Lipitor, bisoprolol and losartan.  Previous echocardiogram from 2019 showed normal LVEF.  She was tolerating her medications well.  She has been diagnosed with diabetes in April 2020 and was currently on Metformin.   She is here for follow-up.  Her blood pressure is elevated at 148/68, heart rate 79, O2 saturations 98%.  She denies any progressive anginal symptoms but does have chronic shortness of breath.  States her shortness of breath has been worse recently.  She has had some issues with anemia and recently received iron infusion with plans for another 7.  She states that shortness of breath could be related to her anemia.  A recent CBC showed no evidence of anemia on 03/16/2020 with a hemoglobin of 13.9 and hematocrit of 43.7.  She denies any CVA or TIA-like symptoms, PND, orthopnea, weight gain, edema, palpitations or arrhythmias.  She continues to smoke.  She states a pack of cigarettes usually lasts about a week.  States she has significantly cut down on smoking.   Past Medical History:  Diagnosis Date   Anemia 04/30/2016   Anxiety    Blood transfusion without reported diagnosis    Cardiomyopathy ([place])    a. EF 40-45% by echo in 04/2016 b. Improved to 60-65% by repeat imaging in 2018   CHF (congestive heart failure) ([facility])    COPD (chronic obstructive pulmonary disease) ([facility])     Coronary artery calcification seen on CT scan    Essential hypertension    GERD (gastroesophageal reflux disease)    History of bronchitis    History of GI bleed    Hyperlipidemia    Iron deficiency anemia 05/12/2016   Bleeding ulcers   Leukocytosis 03/23/2020   Pollen allergy    PSVT (paroxysmal supraventricular tachycardia) ([facility])    PVC's (premature ventricular contractions)    Thrombocytosis ([place]) 03/23/2020   Vitamin B12 deficiency 03/23/2020    Past Surgical History:  Procedure Laterality Date   ABDOMINAL HYSTERECTOMY     polyps  about age 48   AIKEN OSTEOTOMY Right 07/10/2013   Procedure: Barbie Banner OSTEOTOMY RIGHT FOOT;  Surgeon: Marcheta Grammes, DPM;  Location: [facility];  Service: Orthopedics;  Laterality: Right;   AMPUTATION Left 05/09/2017   Procedure: AMPUTATION 5TH TOE LEFT FOOT;  Surgeon: [name], DPM;  Location: [facility];  Service: Podiatry;  Laterality: Left;   AMPUTATION Left 06/13/2017   Procedure: AMPUTATION 2ND TOE LEFT FOOT;  Surgeon: [name], DPM;  Location: [facility];  Service: Podiatry;  Laterality: Left;   BUNIONECTOMY Right 07/10/2013   Procedure: VOGLER BUNIONECTOMY RIGHT FOOT;  Surgeon: Marcheta Grammes, DPM;  Location: [facility];  Service: Orthopedics;  Laterality: Right;   CHOLECYSTECTOMY N/A 08/22/2017   Procedure: LAPAROSCOPIC CHOLECYSTECTOMY;  Surgeon: Virl Cagey, MD;  Location: [facility];  Service: General;  Laterality: N/A;   COLONOSCOPY N/A 07/07/2016   Dr. Oneida Alar; redundant left colon, diverticulosis at hepatic flexure, non-bleeding internal  hemorrhoids   ESOPHAGOGASTRODUODENOSCOPY N/A 07/07/2016   Dr. Oneida Alar: many non-bleeding cratered gastric ulcers without stigmata of bleeding in gastric antrum. four 2-3 mm angioectasias without bleeding in duodenal bulb and second portion of duodenum s/p APC. Chroni gastritis on path.    ESOPHAGOGASTRODUODENOSCOPY N/A 08/03/2017   Dr. Oneida Alar: erosive gastritis, AVMs. Found a  single non-bleeding angioectasia in stomach, s/p APC therapy. Four non-bleeding angioectasias in duodenum s/p APC. Non-bleeding erosive gastropathy   IR ANGIOGRAM EXTREMITY LEFT  04/24/2017   IR FEM POP ART ATHERECT INC PTA MOD SED  04/24/2017   IR INFUSION THROMBOL ARTERIAL INITIAL (MS)  04/24/2017   IR RADIOLOGIST EVAL & MGMT  12/05/2016   IR US GUIDE VASC ACCESS RIGHT  04/24/2017   LIVER BIOPSY N/A 08/22/2017   Procedure: LIVER BIOPSY;  Surgeon: Virl Cagey, MD;  Location: [facility];  Service: General;  Laterality: N/A;   METATARSAL HEAD EXCISION Right 07/10/2013   Procedure: METATARSAL HEAD RESECTION OF DIGITS 2 AND 3 RIGHT FOOT;  Surgeon: Marcheta Grammes, DPM;  Location: [facility];  Service: Orthopedics;  Laterality: Right;   PROXIMAL INTERPHALANGEAL FUSION (PIP) Right 07/10/2013   Procedure: ARTHRODESIS PIPJ  2ND DIGIT RIGHT FOOT;  Surgeon: Marcheta Grammes, DPM;  Location: [facility];  Service: Orthopedics;  Laterality: Right;    Current Outpatient Medications  Medication Sig Dispense Refill   acetaminophen (TYLENOL) 500 MG tablet Take 500 mg by mouth at bedtime as needed for moderate pain.      albuterol (PROVENTIL) (2.5 MG/3ML) 0.083% nebulizer solution Take 3 mLs (2.5 mg total) by nebulization every 4 (four) hours as needed for wheezing or shortness of breath. 75 mL 5   aspirin 81 MG chewable tablet Chew 81 mg by mouth every morning.      atorvastatin (LIPITOR) 20 MG tablet TAKE ONE TABLET (20MG TOTAL) BY MOUTH DAILY 90 tablet 3   bisoprolol (ZEBETA) 10 MG tablet TAKE ONE (1) TABLET BY MOUTH EVERY DAY 90 tablet 3   Budeson-Glycopyrrol-Formoterol (BREZTRI AEROSPHERE) 160-9-4.8 MCG/ACT AERO Inhale 2 puffs into the lungs 2 (two) times daily. 11.8 g 0   Continuous Blood Gluc Receiver (FREESTYLE LIBRE 14 DAY READER) DEVI See admin instructions. 6 each 1   Continuous Blood Gluc Sensor (FREESTYLE LIBRE 14 DAY SENSOR) MISC USE AS DIRECTED 6 each 0   cyanocobalamin  (,VITAMIN B-12,) 1000 MCG/ML injection Inject 1 mL (1,000 mcg total) into the muscle every 30 (thirty) days. 1 mL 11   empagliflozin (JARDIANCE) 10 MG TABS tablet Take 10 mg by mouth daily before breakfast. 30 tablet 4   gabapentin (NEURONTIN) 300 MG capsule Take 1 capsule (300 mg total) by mouth 2 (two) times daily. 180 capsule 0   glucose blood (ONETOUCH ULTRA) test strip Use as instructed 100 each 12   glucose monitoring kit (FREESTYLE) monitoring kit Use to check blood sugar BID as directed 1 each 0   Lancets (ONETOUCH DELICA PLUS [id number]) MISC 1 each by Does not apply route 2 (two) times a day. 100 each 3   losartan (COZAAR) 100 MG tablet TAKE ONE (1) TABLET BY MOUTH EVERY DAY 90 tablet 3   metFORMIN (GLUCOPHAGE) 1000 MG tablet Take 1 tablet (1,000 mg total) by mouth 2 (two) times daily with a meal. 180 tablet 3   Misc. Devices MISC Please provide supplies (needle, syringe, alcohol swabs) needed for patient to self-administer B-12 injections monthly. 1 each 12   pantoprazole (PROTONIX) 40 MG tablet TAKE ONE (1) TABLET 30 MINS BEFORE  YOUR FIRST MEAL. 90 tablet 3   ULTICARE TUBERCULIN SAFETY SYR 25G X 1" 1 ML MISC USE TO ADMINISTER INJECTABLE VITAMIN B12. 1 each 11   No current facility-administered medications for this visit.   Allergies:  Bee venom, Codeine, Penicillins, Bupropion, and Sudafed [pseudoephedrine hcl]   Social History: The patient  reports that she has been smoking cigarettes. She started smoking about 44 years ago. She has a 22.00 pack-year smoking history. She has never used smokeless tobacco. She reports previous alcohol use of about 2.0 standard drinks of alcohol per week. She reports that she does not use drugs.   Family History: The patient's family history includes Coronary artery disease in her sister; Hypertension in her father; Ulcers in her maternal grandmother. She was adopted.   ROS:  Please see the history of present illness. Otherwise, complete review  of systems is positive for none.  All other systems are reviewed and negative.   Physical Exam: VS:  BP (!) 148/68    Pulse 79    Ht '5\' 4"'  (1.626 m)    Wt 150 lb (68 kg)    SpO2 98%    BMI 25.75 kg/m , BMI Body mass index is 25.75 kg/m.  Wt Readings from Last 3 Encounters:  03/25/20 150 lb (68 kg)  03/23/20 149 lb 12.8 oz (67.9 kg)  12/23/19 156 lb (70.8 kg)    General: Patient appears comfortable at rest. Neck: Supple, no elevated JVP or carotid bruits, no thyromegaly. Lungs: Clear to auscultation, nonlabored breathing at rest. Cardiac: Regular rate and rhythm, no S3 or significant systolic murmur, no pericardial rub. Extremities: No pitting edema, distal pulses 2+. Skin: Warm and dry. Musculoskeletal: No kyphosis. Neuropsychiatric: Alert and oriented x3, affect grossly appropriate.  ECG:  03/25/2020 EKG shows sinus rhythm rate of 77, old anterior septal infarct, nonspecific ST depression/nondiagnostic.  Recent Labwork: 12/09/2019: ALT 19; AST 15; BUN 9; Creatinine, Ser 0.62; Potassium 3.8; Sodium 141 03/16/2020: Hemoglobin 13.9; Platelets 419     Component Value Date/Time   CHOL 165 07/02/2019 1539   TRIG 200 (H) 07/02/2019 1539   HDL 32 (L) 07/02/2019 1539   CHOLHDL 5.2 (H) 07/02/2019 1539   CHOLHDL 4.4 09/28/2017 1201   VLDL NOT CALC 03/30/2017 1520   LDLCALC 98 07/02/2019 1539   LDLCALC 88 09/28/2017 1201    Other Studies Reviewed Today:   Assessment and Plan:  1. Essential hypertension   2. Smoking   3. Iron deficiency anemia, unspecified iron deficiency anemia type    1. Essential hypertension Blood pressure remains elevated today at 148/68.  Previous blood pressure  on 03/23/2020 showed a blood pressure of 176/89 and an earlier blood pressure on 12/23/2019 showed blood pressure of 172/82.  She is currently on bisoprolol 10 mg daily as well as losartan 100 mg daily.  Advised her to start checking her blood pressure daily for the next 2 weeks and come back for nursing  visit and bring a log of blood pressures with her when she has her blood pressure rechecked.  We may need to add to therapy if blood pressure remains elevated.  2.  DM type II Recent diagnosis of type 2 diabetes.  Recently seen endocrinology in [place].  Jardiance 10 mg daily was added to her current regimen of Metformin 1000 mg p.o. twice daily.  Last hemoglobin A1c was 8.7%.  She is following with endocrinology and PCP for management.  3.  Smoker Long history of smoking.  Continues to smoke.  Patient states she is smoking approximately 1 pack/week now which is significantly less than previous.  Highly advised smoking cessation.  Medication Adjustments/Labs and Tests Ordered: Current medicines are reviewed at length with the patient today.  Concerns regarding medicines are outlined above.   Disposition: Follow-up with Dr. Domenic Polite or APP 6 months  Signed, Levell July, NP 03/25/2020 2:15 PM    [facility] at [address], [place], [place] [address] Phone: [phone]; Fax: [phone]

## 2020-03-30 ENCOUNTER — Encounter: Payer: Self-pay | Admitting: Emergency Medicine

## 2020-03-30 ENCOUNTER — Ambulatory Visit
Admission: EM | Admit: 2020-03-30 | Discharge: 2020-03-30 | Disposition: A | Discharge status: Home / Self Care | Payer: PPO | Attending: Emergency Medicine | Admitting: Emergency Medicine

## 2020-03-30 DIAGNOSIS — M25511 Pain in right shoulder: Secondary | ICD-10-CM | POA: Diagnosis not present

## 2020-03-30 MED ORDER — PREDNISONE 10 MG PO TABS
10.0000 mg | ORAL_TABLET | Freq: Every day | ORAL | 0 refills | Status: AC
Start: 2020-03-30 — End: 2020-04-02

## 2020-03-30 MED ORDER — KETOROLAC TROMETHAMINE 60 MG/2ML IM SOLN
60.0000 mg | Freq: Once | INTRAMUSCULAR | Status: AC
  Administered 2020-03-30: 60 mg via INTRAMUSCULAR

## 2020-03-30 MED ORDER — CYCLOBENZAPRINE HCL 5 MG PO TABS: 5.0000 mg | ORAL_TABLET | Freq: Three times a day (TID) | ORAL | 0 refills | Status: DC | PRN

## 2020-03-30 NOTE — ED Triage Notes (Signed)
RT shoulder pain with movement started last Monday.  Denies any injuries.  Has tried tylenol and muscle rubs with no relief.

## 2020-03-30 NOTE — Discharge Instructions (Addendum)
Toradol IM was given in office Low-dose prednisone was prescribed for 3 days Flexeril was prescribed, do not use machinery or drive while taking this medication. Continue to take OTC Tylenol as needed for pain Follow RICE instruction days attached Follow-up with PCP Return or go to ED for worsening of symptoms

## 2020-03-30 NOTE — ED Provider Notes (Signed)
[name]   [id number] 03/30/20 Arrival Time: 6283   Chief Complaint  Patient presents with  . Shoulder Pain     SUBJECTIVE: History from: patient.  Tammy Mcpherson is a 64 y.o. adult with a complaint of right shoulder pain for the past 1 to 2 days.  Report pain on movement.  Denies any precipitating, injury or trauma.  She localizes the pain to the right shoulder.  She describes the pain as constant and achy, rated at 10 on a scale of 1-10.  She has tried OTC medications without relief.  Her symptoms are made worse with ROM.  She denies similar symptoms in the past.  Denies chills, fever, nausea, vomiting, diarrhea.   ROS: As per HPI.  All other pertinent ROS negative.     Past Medical History:  Diagnosis Date  . Anemia 04/30/2016  . Anxiety   . Blood transfusion without reported diagnosis   . Cardiomyopathy ([place])    a. EF 40-45% by echo in 04/2016 b. Improved to 60-65% by repeat imaging in 2018  . CHF (congestive heart failure) ([place])   . COPD (chronic obstructive pulmonary disease) ([place])   . Coronary artery calcification seen on CT scan   . Essential hypertension   . GERD (gastroesophageal reflux disease)   . History of bronchitis   . History of GI bleed   . Hyperlipidemia   . Iron deficiency anemia 05/12/2016   Bleeding ulcers  . Leukocytosis 03/23/2020  . Pollen allergy   . PSVT (paroxysmal supraventricular tachycardia) ([place])   . PVC's (premature ventricular contractions)   . Thrombocytosis ([place]) 03/23/2020  . Vitamin B12 deficiency 03/23/2020   Past Surgical History:  Procedure Laterality Date  . ABDOMINAL HYSTERECTOMY     polyps  about age 46  . Barbie Banner OSTEOTOMY Right 07/10/2013   Procedure: Barbie Banner OSTEOTOMY RIGHT FOOT;  Surgeon: Marcheta Grammes, DPM;  Location: [facility];  Service: Orthopedics;  Laterality: Right;  . AMPUTATION Left 05/09/2017   Procedure: AMPUTATION 5TH TOE LEFT FOOT;  Surgeon: [name], DPM;  Location: [facility];  Service:  Podiatry;  Laterality: Left;  . AMPUTATION Left 06/13/2017   Procedure: AMPUTATION 2ND TOE LEFT FOOT;  Surgeon: [name], DPM;  Location: [facility];  Service: Podiatry;  Laterality: Left;  . BUNIONECTOMY Right 07/10/2013   Procedure: VOGLER BUNIONECTOMY RIGHT FOOT;  Surgeon: Marcheta Grammes, DPM;  Location: [facility];  Service: Orthopedics;  Laterality: Right;  . CHOLECYSTECTOMY N/A 08/22/2017   Procedure: LAPAROSCOPIC CHOLECYSTECTOMY;  Surgeon: Virl Cagey, MD;  Location: [facility];  Service: General;  Laterality: N/A;  . COLONOSCOPY N/A 07/07/2016   Dr. Oneida Alar; redundant left colon, diverticulosis at hepatic flexure, non-bleeding internal hemorrhoids  . ESOPHAGOGASTRODUODENOSCOPY N/A 07/07/2016   Dr. Oneida Alar: many non-bleeding cratered gastric ulcers without stigmata of bleeding in gastric antrum. four 2-3 mm angioectasias without bleeding in duodenal bulb and second portion of duodenum s/p APC. Chroni gastritis on path.   . ESOPHAGOGASTRODUODENOSCOPY N/A 08/03/2017   Dr. Oneida Alar: erosive gastritis, AVMs. Found a single non-bleeding angioectasia in stomach, s/p APC therapy. Four non-bleeding angioectasias in duodenum s/p APC. Non-bleeding erosive gastropathy  . IR ANGIOGRAM EXTREMITY LEFT  04/24/2017  . IR FEM POP ART ATHERECT INC PTA MOD SED  04/24/2017  . IR INFUSION THROMBOL ARTERIAL INITIAL (MS)  04/24/2017  . IR RADIOLOGIST EVAL & MGMT  12/05/2016  . IR US GUIDE VASC ACCESS RIGHT  04/24/2017  . LIVER BIOPSY N/A 08/22/2017   Procedure: LIVER BIOPSY;  Surgeon: Virl Cagey, MD;  Location: [facility];  Service: General;  Laterality: N/A;  . METATARSAL HEAD EXCISION Right 07/10/2013   Procedure: METATARSAL HEAD RESECTION OF DIGITS 2 AND 3 RIGHT FOOT;  Surgeon: Marcheta Grammes, DPM;  Location: [facility];  Service: Orthopedics;  Laterality: Right;  . PROXIMAL INTERPHALANGEAL FUSION (PIP) Right 07/10/2013   Procedure: ARTHRODESIS PIPJ  2ND DIGIT RIGHT FOOT;  Surgeon: Marcheta Grammes, DPM;  Location: [facility];  Service: Orthopedics;  Laterality: Right;   Allergies  Allergen Reactions  . Bee Venom Swelling  . Codeine Anaphylaxis    Tongue swelled  . Penicillins Swelling and Rash    Has patient had a PCN reaction causing immediate rash, facial/tongue/throat swelling, SOB or lightheadedness with hypotension: No, delayed Has patient had a PCN reaction causing severe rash involving mucus membranes or skin necrosis: No Has patient had a PCN reaction that required hospitalization No Has patient had a PCN reaction occurring within the last 10 years: No If all of the above answers are "NO", then may proceed with Cephalosporin use.   . Bupropion Other (See Comments)    "Made my skin crawl"  . Sudafed [Pseudoephedrine Hcl] Rash   No current facility-administered medications on file prior to encounter.   Current Outpatient Medications on File Prior to Encounter  Medication Sig Dispense Refill  . acetaminophen (TYLENOL) 500 MG tablet Take 500 mg by mouth at bedtime as needed for moderate pain.     Marland Kitchen albuterol (PROVENTIL) (2.5 MG/3ML) 0.083% nebulizer solution Take 3 mLs (2.5 mg total) by nebulization every 4 (four) hours as needed for wheezing or shortness of breath. 75 mL 5  . aspirin 81 MG chewable tablet Chew 81 mg by mouth every morning.     Marland Kitchen atorvastatin (LIPITOR) 20 MG tablet TAKE ONE TABLET (20MG TOTAL) BY MOUTH DAILY 90 tablet 3  . bisoprolol (ZEBETA) 10 MG tablet TAKE ONE (1) TABLET BY MOUTH EVERY DAY 90 tablet 3  . Budeson-Glycopyrrol-Formoterol (BREZTRI AEROSPHERE) 160-9-4.8 MCG/ACT AERO Inhale 2 puffs into the lungs 2 (two) times daily. 11.8 g 0  . Continuous Blood Gluc Receiver (FREESTYLE LIBRE 14 DAY READER) DEVI See admin instructions. 6 each 1  . Continuous Blood Gluc Sensor (FREESTYLE LIBRE 14 DAY SENSOR) MISC USE AS DIRECTED 6 each 0  . cyanocobalamin (,VITAMIN B-12,) 1000 MCG/ML injection Inject 1 mL (1,000 mcg total) into the muscle every 30 (thirty)  days. 1 mL 11  . empagliflozin (JARDIANCE) 10 MG TABS tablet Take 10 mg by mouth daily before breakfast. 30 tablet 4  . gabapentin (NEURONTIN) 300 MG capsule Take 1 capsule (300 mg total) by mouth 2 (two) times daily. 180 capsule 0  . glucose blood (ONETOUCH ULTRA) test strip Use as instructed 100 each 12  . glucose monitoring kit (FREESTYLE) monitoring kit Use to check blood sugar BID as directed 1 each 0  . Lancets (ONETOUCH DELICA PLUS [id number]) MISC 1 each by Does not apply route 2 (two) times a day. 100 each 3  . losartan (COZAAR) 100 MG tablet TAKE ONE (1) TABLET BY MOUTH EVERY DAY 90 tablet 3  . metFORMIN (GLUCOPHAGE) 1000 MG tablet Take 1 tablet (1,000 mg total) by mouth 2 (two) times daily with a meal. 180 tablet 3  . Misc. Devices MISC Please provide supplies (needle, syringe, alcohol swabs) needed for patient to self-administer B-12 injections monthly. 1 each 12  . pantoprazole (PROTONIX) 40 MG tablet TAKE ONE (1) TABLET 30 MINS BEFORE YOUR FIRST  MEAL. 90 tablet 3  . ULTICARE TUBERCULIN SAFETY SYR 25G X 1" 1 ML MISC USE TO ADMINISTER INJECTABLE VITAMIN B12. 1 each 11   Social History   Socioeconomic History  . Marital status: Married    Spouse name: [name]  . Number of children: 1  . Years of education: 25  . Highest education level: Not on file  Occupational History  . Occupation: disabled    Comment: heart  Tobacco Use  . Smoking status: Light Tobacco Smoker    Packs/day: 0.50    Years: 44.00    Pack years: 22.00    Types: Cigarettes    Start date: 05/10/1975  . Smokeless tobacco: Never Used  . Tobacco comment: 2 cigarettes per day 12/10/2019  Vaping Use  . Vaping Use: Former  Substance and Sexual Activity  . Alcohol use: Not Currently    Alcohol/week: 2.0 standard drinks    Types: 2 Glasses of wine per week  . Drug use: No  . Sexual activity: Yes    Birth control/protection: Surgical  Other Topics Concern  . Not on file  Social History Narrative   Disabled    Lives with husband [name]   Two dogs   Social Determinants of Health   Financial Resource Strain:   . Difficulty of Paying Living Expenses:   Food Insecurity:   . Worried About Charity fundraiser in the Last Year:   . Arboriculturist in the Last Year:   Transportation Needs:   . Film/video editor (Medical):   Marland Kitchen Lack of Transportation (Non-Medical):   Physical Activity:   . Days of Exercise per Week:   . Minutes of Exercise per Session:   Stress:   . Feeling of Stress :   Social Connections:   . Frequency of Communication with Friends and Family:   . Frequency of Social Gatherings with Friends and Family:   . Attends Religious Services:   . Active Member of Clubs or Organizations:   . Attends Archivist Meetings:   Marland Kitchen Marital Status:   Intimate Partner Violence:   . Fear of Current or Ex-Partner:   . Emotionally Abused:   Marland Kitchen Physically Abused:   . Sexually Abused:    Family History  Adopted: Yes  Problem Relation Age of Onset  . Hypertension Father   . Coronary artery disease Sister   . Ulcers Maternal Grandmother   . Colon cancer Neg Hx        unknown, was adopted    OBJECTIVE:  Vitals:   03/30/20 1238 03/30/20 1255  BP: (!) 186/81   Pulse: 83   Resp: 16   Temp: 98 F (36.7 C)   TempSrc: Oral   SpO2: 95%   Weight:  145 lb (65.8 kg)  Height:  _0  (1.626 m)     Physical Exam Vitals and nursing note reviewed.  Constitutional:      General: She is not in acute distress.    Appearance: Normal appearance. She is normal weight. She is not ill-appearing, toxic-appearing or diaphoretic.  Cardiovascular:     Rate and Rhythm: Normal rate and regular rhythm.     Pulses: Normal pulses.     Heart sounds: Normal heart sounds. No murmur heard.  No friction rub. No gallop.   Pulmonary:     Effort: Pulmonary effort is normal. No respiratory distress.     Breath sounds: Normal breath sounds. No stridor. No wheezing, rhonchi or rales.  Chest:  Chest  wall: No tenderness.  Musculoskeletal:        General: Tenderness present.     Comments: The right shoulder is is without any obvious asymmetry or deformity when compared to the left shoulder.  No surface trauma, ecchymosis.  Tenderness and spasm present over shoulder muscle.  Limited range of motion due to pain.  No axillary lymphadenopathy.  Neurovascular status intact  Neurological:     Mental Status: She is alert.      LABS:  No results found for this or any previous visit (from the past 24 hour(s)).   ASSESSMENT & PLAN:  1. Acute pain of right shoulder     Meds ordered this encounter  Medications  . ketorolac (TORADOL) injection 60 mg  . predniSONE (DELTASONE) 10 MG tablet    Sig: Take 1 tablet (10 mg total) by mouth daily for 3 days.    Dispense:  3 tablet    Refill:  0  . cyclobenzaprine (FLEXERIL) 5 MG tablet    Sig: Take 1 tablet (5 mg total) by mouth 3 (three) times daily as needed for muscle spasms.    Dispense:  30 tablet    Refill:  0    Discharge Instructions  Toradol IM was given in office Low-dose prednisone was prescribed for 3 days Flexeril was prescribed, do not use machinery or drive while taking this medication. Continue to take OTC Tylenol as needed for pain Follow RICE instruction days attached Follow-up with PCP Return or go to ED for worsening of symptoms  Reviewed expectations re: course of current medical issues. Questions answered. Outlined signs and symptoms indicating need for more acute intervention. Patient verbalized understanding. After Visit Summary given.      Note: This document was prepared using Dragon voice recognition software and may include unintentional dictation errors.    Emerson Monte, FNP 03/30/20 1315

## 2020-04-01 ENCOUNTER — Encounter (HOSPITAL_COMMUNITY): Payer: Self-pay

## 2020-04-01 ENCOUNTER — Inpatient Hospital Stay (HOSPITAL_COMMUNITY): Payer: PPO | Attending: Hematology

## 2020-04-01 VITALS — BP 136/82 | HR 75 | Temp 97.5°F | Resp 18

## 2020-04-01 DIAGNOSIS — D720 Genetic anomalies of leukocytes: Secondary | ICD-10-CM | POA: Diagnosis not present

## 2020-04-01 DIAGNOSIS — D473 Essential (hemorrhagic) thrombocythemia: Secondary | ICD-10-CM | POA: Insufficient documentation

## 2020-04-01 DIAGNOSIS — K922 Gastrointestinal hemorrhage, unspecified: Secondary | ICD-10-CM | POA: Insufficient documentation

## 2020-04-01 DIAGNOSIS — Z8249 Family history of ischemic heart disease and other diseases of the circulatory system: Secondary | ICD-10-CM | POA: Diagnosis not present

## 2020-04-01 DIAGNOSIS — F17210 Nicotine dependence, cigarettes, uncomplicated: Secondary | ICD-10-CM | POA: Insufficient documentation

## 2020-04-01 DIAGNOSIS — D72829 Elevated white blood cell count, unspecified: Secondary | ICD-10-CM | POA: Diagnosis not present

## 2020-04-01 DIAGNOSIS — D500 Iron deficiency anemia secondary to blood loss (chronic): Secondary | ICD-10-CM | POA: Diagnosis not present

## 2020-04-01 DIAGNOSIS — D508 Other iron deficiency anemias: Secondary | ICD-10-CM

## 2020-04-01 DIAGNOSIS — R5383 Other fatigue: Secondary | ICD-10-CM | POA: Diagnosis not present

## 2020-04-01 DIAGNOSIS — Z79899 Other long term (current) drug therapy: Secondary | ICD-10-CM | POA: Insufficient documentation

## 2020-04-01 MED ORDER — SODIUM CHLORIDE 0.9 % IV SOLN
510.0000 mg | Freq: Once | INTRAVENOUS | Status: AC
  Administered 2020-04-01: 510 mg via INTRAVENOUS
  Filled 2020-04-01: qty 510

## 2020-04-01 MED ORDER — SODIUM CHLORIDE 0.9 % IV SOLN: INTRAVENOUS | Status: DC

## 2020-04-01 NOTE — Progress Notes (Signed)
       Feraheme  given today per MD orders. Tolerated infusion without adverse affects. Vital signs stable. No complaints at this time. Patient requests to leave clinic without waiting 30 minutes post infusion. Patient teaching performed. Understanding verbalized.  Discharged from clinic ambulatory. F/U with [facility] as scheduled.

## 2020-04-01 NOTE — Patient Instructions (Signed)
[facility]  Discharge Instructions:   _______________________________________________________________  Thank you for choosing [facility] at [facility] to provide your oncology and hematology care.  To afford each patient quality time with our providers, please arrive at least 15 minutes before your scheduled appointment.  You need to re-schedule your appointment if you arrive 10 or more minutes late.  We strive to give you quality time with our providers, and arriving late affects you and other patients whose appointments are after yours.  Also, if you no show three or more times for appointments you may be dismissed from the clinic.  Again, thank you for choosing [facility] at [facility]. Our hope is that these requests will allow you access to exceptional care and in a timely manner. _______________________________________________________________  If you have questions after your visit, please contact our office at [phone] between the hours of 8:30 a.m. and 5:00 p.m. Voicemails left after 4:30 p.m. will not be returned until the following business day. _______________________________________________________________  For prescription refill requests, have your pharmacy contact our office. _______________________________________________________________  Recommendations made by the consultant and any test results will be sent to your referring physician. _______________________________________________________________ 

## 2020-04-06 ENCOUNTER — Encounter: Payer: Self-pay | Admitting: Registered Nurse

## 2020-04-06 ENCOUNTER — Other Ambulatory Visit: Payer: Self-pay

## 2020-04-06 ENCOUNTER — Ambulatory Visit (INDEPENDENT_AMBULATORY_CARE_PROVIDER_SITE_OTHER): Payer: PPO | Admitting: Registered Nurse

## 2020-04-06 VITALS — BP 126/88 | HR 89 | Temp 97.4°F | Resp 18 | Ht 64.0 in | Wt 148.8 lb

## 2020-04-06 DIAGNOSIS — M549 Dorsalgia, unspecified: Secondary | ICD-10-CM | POA: Diagnosis not present

## 2020-04-06 DIAGNOSIS — Z1231 Encounter for screening mammogram for malignant neoplasm of breast: Secondary | ICD-10-CM | POA: Diagnosis not present

## 2020-04-06 DIAGNOSIS — M25511 Pain in right shoulder: Secondary | ICD-10-CM

## 2020-04-06 LAB — POCT URINALYSIS DIP (CLINITEK)
Bilirubin, UA: NEGATIVE
Blood, UA: NEGATIVE
Glucose, UA: NEGATIVE mg/dL
Ketones, POC UA: NEGATIVE mg/dL
Leukocytes, UA: NEGATIVE
Nitrite, UA: NEGATIVE
POC PROTEIN,UA: NEGATIVE
Spec Grav, UA: 1.02 (ref 1.010–1.025)
Urobilinogen, UA: 0.2 E.U./dL
pH, UA: 6 (ref 5.0–8.0)

## 2020-04-06 MED ORDER — PREDNISONE 10 MG PO TABS: 10.0000 mg | ORAL_TABLET | Freq: Every day | ORAL | 0 refills | Status: DC

## 2020-04-06 NOTE — Patient Instructions (Signed)
° ° ° °  If you have lab work done today you will be contacted with your lab results within the next 2 weeks.  If you have not heard from us then please contact us. The fastest way to get your results is to register for My Chart. ° ° °IF you received an x-ray today, you will receive an invoice from [facility]. Please contact [facility] at [phone] with questions or concerns regarding your invoice.  ° °IF you received labwork today, you will receive an invoice from [facility]. Please contact [facility] at [phone] with questions or concerns regarding your invoice.  ° °Our billing staff will not be able to assist you with questions regarding bills from these companies. ° °You will be contacted with the lab results as soon as they are available. The fastest way to get your results is to activate your My Chart account. Instructions are located on the last page of this paperwork. If you have not heard from us regarding the results in 2 weeks, please contact this office. °  ° ° ° °

## 2020-04-07 ENCOUNTER — Encounter: Payer: Self-pay | Admitting: Registered Nurse

## 2020-04-07 ENCOUNTER — Other Ambulatory Visit: Payer: Self-pay | Admitting: Registered Nurse

## 2020-04-07 ENCOUNTER — Telehealth: Payer: Self-pay | Admitting: Registered Nurse

## 2020-04-07 DIAGNOSIS — M549 Dorsalgia, unspecified: Secondary | ICD-10-CM

## 2020-04-07 MED ORDER — METHOCARBAMOL 500 MG PO TABS: 500.0000 mg | ORAL_TABLET | Freq: Four times a day (QID) | ORAL | 0 refills | Status: DC

## 2020-04-07 NOTE — Telephone Encounter (Signed)
Patient states there were suppose to be 3 medications sent to the pharmacy but she only has Prednisone. Please Advise

## 2020-04-07 NOTE — Telephone Encounter (Signed)
Pts husband called and stated that the pharmacy has not received the two drugs listed below that were suppose to be proscribed to pt from last visit. Pt would like a call when these are sent in.  lexitrol and tramadol Please advise.

## 2020-04-08 ENCOUNTER — Encounter: Payer: Self-pay | Admitting: *Deleted

## 2020-04-08 ENCOUNTER — Other Ambulatory Visit: Payer: Self-pay

## 2020-04-08 ENCOUNTER — Ambulatory Visit (INDEPENDENT_AMBULATORY_CARE_PROVIDER_SITE_OTHER): Payer: PPO | Admitting: *Deleted

## 2020-04-08 VITALS — BP 140/70 | HR 76 | Ht 64.0 in | Wt 149.0 lb

## 2020-04-08 DIAGNOSIS — I10 Essential (primary) hypertension: Secondary | ICD-10-CM | POA: Diagnosis not present

## 2020-04-08 NOTE — Progress Notes (Signed)
Presents to office for nurse BP check per last office visit. Brought home BP readings to office today for review. Reports all home BP readings were taken in the PM and at various times ranging from 8:20 pm to 11:15 pm. Has taken all doses of medication without side effects. Denies dizziness, chest pain or sob. Medications reviewed.

## 2020-04-08 NOTE — Patient Instructions (Addendum)
   Continue monitoring home blood pressures taking them at the same time each day, same arm and same cuff.   Check blood pressures in the mornings vs evenings about 1-2 hours after blood pressure medications.   Contact our office in 2 weeks with her BP readings through mychart.   Follow up as planned

## 2020-04-08 NOTE — Telephone Encounter (Signed)
I do not see that these meds has been sent into the pharmacy and I do not see where these meds was mentioned on the AVS. Please advise

## 2020-04-09 NOTE — Telephone Encounter (Signed)
Lexitrol is not a medication I would have prescribed her, and unfortunately she is allergic to codeine, precluding tramadol use.  Thank you  Kathrin Ruddy, NP

## 2020-04-12 ENCOUNTER — Other Ambulatory Visit: Payer: Self-pay | Admitting: Registered Nurse

## 2020-04-12 ENCOUNTER — Telehealth: Payer: Self-pay | Admitting: Registered Nurse

## 2020-04-12 DIAGNOSIS — Z1231 Encounter for screening mammogram for malignant neoplasm of breast: Secondary | ICD-10-CM

## 2020-04-12 NOTE — Telephone Encounter (Signed)
Done,  Thanks,  Rich

## 2020-04-12 NOTE — Telephone Encounter (Signed)
Tammy Mcpherson requested the type of Mammo be changed to a screening mammogram rather than diagnostic as the Dx code is screening. Please and thank you!

## 2020-04-12 NOTE — Telephone Encounter (Signed)
Can you please change the order to a screening mammogram instead of a diagnostic since the diagnosis is screening mammogram? Thanks

## 2020-04-14 ENCOUNTER — Encounter: Payer: Self-pay | Admitting: Registered Nurse

## 2020-04-14 ENCOUNTER — Other Ambulatory Visit: Payer: Self-pay | Admitting: Registered Nurse

## 2020-04-14 DIAGNOSIS — E1140 Type 2 diabetes mellitus with diabetic neuropathy, unspecified: Secondary | ICD-10-CM

## 2020-04-14 NOTE — Telephone Encounter (Signed)
Patient medication was sent to the pharmacy 

## 2020-05-05 ENCOUNTER — Other Ambulatory Visit: Payer: Self-pay | Admitting: *Deleted

## 2020-05-05 ENCOUNTER — Telehealth: Payer: Self-pay | Admitting: *Deleted

## 2020-05-05 DIAGNOSIS — I10 Essential (primary) hypertension: Secondary | ICD-10-CM

## 2020-05-05 MED ORDER — POTASSIUM CHLORIDE ER 10 MEQ PO TBCR
10.0000 meq | EXTENDED_RELEASE_TABLET | Freq: Every day | ORAL | 3 refills | Status: DC
Start: 2020-05-05 — End: 2020-09-07

## 2020-05-05 MED ORDER — CHLORTHALIDONE 25 MG PO TABS
25.0000 mg | ORAL_TABLET | Freq: Every day | ORAL | 3 refills | Status: DC
Start: 2020-05-05 — End: 2020-06-04

## 2020-05-05 NOTE — Telephone Encounter (Signed)
After home BP readings reviewed by Domenic Polite he requested that patient start chlorthalidone 25 mg daily as well as potassium chloride 10 meq daily and check BMET in 7-10 days. Patient informed and verbalized understanding of plan. Both rx's sent to [facility] and lab order faxed to [facility].

## 2020-05-06 ENCOUNTER — Inpatient Hospital Stay (HOSPITAL_COMMUNITY): Payer: PPO | Attending: Hematology

## 2020-05-06 ENCOUNTER — Ambulatory Visit (HOSPITAL_COMMUNITY): Payer: PPO

## 2020-05-06 ENCOUNTER — Other Ambulatory Visit: Payer: Self-pay

## 2020-05-06 DIAGNOSIS — D500 Iron deficiency anemia secondary to blood loss (chronic): Secondary | ICD-10-CM | POA: Diagnosis not present

## 2020-05-06 LAB — CBC WITH DIFFERENTIAL/PLATELET
Abs Immature Granulocytes: 0.09 10*3/uL — ABNORMAL HIGH (ref 0.00–0.07)
Basophils Absolute: 0.1 10*3/uL (ref 0.0–0.1)
Basophils Relative: 0 %
Eosinophils Absolute: 0.2 10*3/uL (ref 0.0–0.5)
Eosinophils Relative: 1 %
HCT: 44.9 % (ref 36.0–46.0)
Hemoglobin: 14.6 g/dL (ref 12.0–15.0)
Immature Granulocytes: 1 %
Lymphocytes Relative: 12 %
Lymphs Abs: 1.8 10*3/uL (ref 0.7–4.0)
MCH: 30.9 pg (ref 26.0–34.0)
MCHC: 32.5 g/dL (ref 30.0–36.0)
MCV: 94.9 fL (ref 80.0–100.0)
Monocytes Absolute: 1.5 10*3/uL — ABNORMAL HIGH (ref 0.1–1.0)
Monocytes Relative: 11 %
Neutro Abs: 10.9 10*3/uL — ABNORMAL HIGH (ref 1.7–7.7)
Neutrophils Relative %: 75 %
Platelets: 401 10*3/uL — ABNORMAL HIGH (ref 150–400)
RBC: 4.73 MIL/uL (ref 3.87–5.11)
RDW: 15 % (ref 11.5–15.5)
WBC: 14.6 10*3/uL — ABNORMAL HIGH (ref 4.0–10.5)
nRBC: 0 % (ref 0.0–0.2)

## 2020-05-06 LAB — IRON AND TIBC
Iron: 128 ug/dL (ref 28–170)
Saturation Ratios: 37 % — ABNORMAL HIGH (ref 10.4–31.8)
TIBC: 351 ug/dL (ref 250–450)
UIBC: 223 ug/dL

## 2020-05-06 LAB — FERRITIN: Ferritin: 204 ng/mL (ref 11–307)

## 2020-05-19 ENCOUNTER — Other Ambulatory Visit: Payer: Self-pay

## 2020-05-19 ENCOUNTER — Ambulatory Visit (HOSPITAL_COMMUNITY)
Admission: RE | Admit: 2020-05-19 | Discharge: 2020-05-19 | Disposition: A | Discharge status: Home / Self Care | Payer: PPO | Source: Ambulatory Visit | Attending: Registered Nurse | Admitting: Registered Nurse

## 2020-05-19 ENCOUNTER — Encounter: Payer: Self-pay | Admitting: Registered Nurse

## 2020-05-19 ENCOUNTER — Other Ambulatory Visit (HOSPITAL_COMMUNITY)
Admission: RE | Admit: 2020-05-19 | Discharge: 2020-05-19 | Disposition: A | Discharge status: Home / Self Care | Payer: PPO | Source: Ambulatory Visit | Attending: Cardiology | Admitting: Cardiology

## 2020-05-19 DIAGNOSIS — Z1231 Encounter for screening mammogram for malignant neoplasm of breast: Secondary | ICD-10-CM | POA: Insufficient documentation

## 2020-05-19 DIAGNOSIS — I10 Essential (primary) hypertension: Secondary | ICD-10-CM | POA: Insufficient documentation

## 2020-05-19 LAB — BASIC METABOLIC PANEL
Anion gap: 11 (ref 5–15)
BUN: 14 mg/dL (ref 8–23)
CO2: 22 mmol/L (ref 22–32)
Calcium: 9.4 mg/dL (ref 8.9–10.3)
Chloride: 103 mmol/L (ref 98–111)
Creatinine, Ser: 0.66 mg/dL (ref 0.44–1.00)
GFR calc Af Amer: >60 mL/min (ref >60)
GFR calc non Af Amer: >60 mL/min (ref >60)
Glucose, Bld: 254 mg/dL — ABNORMAL HIGH (ref 70–99)
Potassium: 4.3 mmol/L (ref 3.5–5.1)
Sodium: 136 mmol/L (ref 135–145)

## 2020-05-20 ENCOUNTER — Telehealth: Payer: Self-pay | Admitting: *Deleted

## 2020-05-20 NOTE — Telephone Encounter (Signed)
-----   Message from Satira Sark, MD sent at 05/19/2020  4:29 PM EDT ----- Results reviewed.  Potassium and renal function are normal.  Continue with current medications.

## 2020-05-20 NOTE — Telephone Encounter (Signed)
Left detailed message per DPR on VM

## 2020-05-24 NOTE — Telephone Encounter (Signed)
Pt is calling back asking for results.

## 2020-05-24 NOTE — Telephone Encounter (Signed)
We do not have these results we will call her when these have been received and reviewed

## 2020-05-24 NOTE — Telephone Encounter (Signed)
Pt called in and stated she did not get a VM .  She stated she would like someone to call her back with those  Best number [phone]

## 2020-05-24 NOTE — Telephone Encounter (Signed)
Pt returned call - says she already received lab results VM - pt is asking for mammogram results - ordered by Dr Orland Mustard

## 2020-06-04 ENCOUNTER — Other Ambulatory Visit: Payer: Self-pay

## 2020-06-04 ENCOUNTER — Encounter: Payer: Self-pay | Admitting: Gastroenterology

## 2020-06-04 ENCOUNTER — Telehealth: Payer: Self-pay

## 2020-06-04 ENCOUNTER — Ambulatory Visit: Payer: PPO | Admitting: Gastroenterology

## 2020-06-04 ENCOUNTER — Other Ambulatory Visit: Payer: Self-pay | Admitting: Registered Nurse

## 2020-06-04 VITALS — BP 156/72 | HR 78 | Temp 97.0°F | Ht 64.0 in | Wt 145.4 lb

## 2020-06-04 DIAGNOSIS — R1010 Upper abdominal pain, unspecified: Secondary | ICD-10-CM

## 2020-06-04 DIAGNOSIS — E1165 Type 2 diabetes mellitus with hyperglycemia: Secondary | ICD-10-CM

## 2020-06-04 DIAGNOSIS — R197 Diarrhea, unspecified: Secondary | ICD-10-CM | POA: Diagnosis not present

## 2020-06-04 DIAGNOSIS — R1032 Left lower quadrant pain: Secondary | ICD-10-CM | POA: Diagnosis not present

## 2020-06-04 DIAGNOSIS — D500 Iron deficiency anemia secondary to blood loss (chronic): Secondary | ICD-10-CM | POA: Diagnosis not present

## 2020-06-04 MED ORDER — DICYCLOMINE HCL 10 MG PO CAPS: 10.0000 mg | ORAL_CAPSULE | ORAL | 1 refills | Status: DC | PRN

## 2020-06-04 MED ORDER — FREESTYLE LIBRE 14 DAY SENSOR MISC: 0 refills | Status: DC

## 2020-06-04 MED ORDER — FREESTYLE LIBRE 14 DAY READER DEVI: 1 refills | Status: DC

## 2020-06-04 NOTE — Patient Instructions (Signed)
1. CT scan of your abdomen as scheduled. 2. Try bentyl 10mg  before meals or shopping to prevent abdominal cramping and stool urgency. You can take up to four times daily but lowest dose to control your symptoms.

## 2020-06-04 NOTE — Telephone Encounter (Signed)
Pt. Called requesting refills on glucose sensors from [facility]

## 2020-06-04 NOTE — Telephone Encounter (Signed)
Noted. RX sent to [facility].

## 2020-06-04 NOTE — Progress Notes (Signed)
Primary Care Physician: [name], NP  Primary Gastroenterologist:  Elon Alas. Abbey Chatters, DO   Chief Complaint  Patient presents with  . Follow-up    fu diarrhea, cramps after eating like labor pains, will have diarrhea if she doesn't take lactaid    HPI: Tammy Mcpherson is a 64 y.o. adult here for follow-up of diarrhea.  Last seen in February 2021 through virtual visit.  She has a history of diarrhea felt to be related to bile salt induced diarrhea and lactose intolerance.  Prior history of peptic ulcer disease in 2017 and gastric/duodenal AVMs status post ablation in 2018.  Last colonoscopy in 2017 diverticulosis, nonbleeding internal hemorrhoids.  Symptoms started around early 2020. Postprandial loose stools with urgency and abdominal cramping. She had her gallbladder removed in 2018. She has noticed symptoms worse with greasy foods. Tried to avoid. Certain restaurants are worse, like Mexican.scared to eat out and shop. Taking Lactaid helps when she consumes dairy. Having symptoms about three times per week. Postprandial stools about three days a week. Starts with LLQ cramping, leads to BM. Urgency. Loose stools. Will go a couple of times and then better. No melena, brbpr. Some days stools are normal. Denies previous history of IBS. No heartburn, vomiting.   Weight fluctuates. In 2020, she weighed anywhere from 145 to 165. She weighed 140 in 10/2019. 12/2019 up to 156. 03/2020 she weighed 145, is the same today.    Cut back on smoking to one pack every four days.  Continues to follow-up with hematology for iron deficiency.  History of gastric and small bowel AVMs, felt to be contributing to her chronic anemia.  She with 6 months between her last iron infusions.  Recently completed 2 infusions in June/July 2021.  She also has chronic leukocytosis and thrombocytosis, followed by hematology.  On monthly B12 injections for B12 deficiency.   Current Outpatient Medications  Medication Sig  Dispense Refill  . acetaminophen (TYLENOL) 500 MG tablet Take 500 mg by mouth at bedtime as needed for moderate pain.     Marland Kitchen albuterol (PROVENTIL) (2.5 MG/3ML) 0.083% nebulizer solution Take 3 mLs (2.5 mg total) by nebulization every 4 (four) hours as needed for wheezing or shortness of breath. 75 mL 5  . aspirin 81 MG chewable tablet Chew 81 mg by mouth every morning.     Marland Kitchen atorvastatin (LIPITOR) 20 MG tablet TAKE ONE TABLET (20MG TOTAL) BY MOUTH DAILY 90 tablet 3  . bisoprolol (ZEBETA) 10 MG tablet TAKE ONE (1) TABLET BY MOUTH EVERY DAY 90 tablet 3  . Budeson-Glycopyrrol-Formoterol (BREZTRI AEROSPHERE) 160-9-4.8 MCG/ACT AERO Inhale 2 puffs into the lungs 2 (two) times daily. 11.8 g 0  . Cholecalciferol (VITAMIN D) 50 MCG (2000 UT) CAPS Take by mouth daily.    . Continuous Blood Gluc Receiver (FREESTYLE LIBRE 14 DAY READER) DEVI See admin instructions. 6 each 1  . Continuous Blood Gluc Sensor (FREESTYLE LIBRE 14 DAY SENSOR) MISC USE AS DIRECTED 6 each 0  . cyanocobalamin (,VITAMIN B-12,) 1000 MCG/ML injection Inject 1 mL (1,000 mcg total) into the muscle every 30 (thirty) days. 1 mL 11  . empagliflozin (JARDIANCE) 10 MG TABS tablet Take 10 mg by mouth daily before breakfast. 30 tablet 4  . gabapentin (NEURONTIN) 300 MG capsule TAKE ONE CAPSULE (300 MG TOTAL) BY MOUTHTWO TIMES DAILY. 180 capsule 0  . glucose blood (ONETOUCH ULTRA) test strip Use as instructed 100 each 12  . glucose monitoring kit (FREESTYLE) monitoring kit Use  to check blood sugar BID as directed 1 each 0  . lactase (LACTAID) 3000 units tablet Take by mouth as needed.    . Lancets (ONETOUCH DELICA PLUS [id number]) MISC 1 each by Does not apply route 2 (two) times a day. 100 each 3  . losartan (COZAAR) 100 MG tablet TAKE ONE (1) TABLET BY MOUTH EVERY DAY 90 tablet 3  . metFORMIN (GLUCOPHAGE) 1000 MG tablet Take 1 tablet (1,000 mg total) by mouth 2 (two) times daily with a meal. (Patient taking differently: Take 500 mg by mouth 2 (two)  times daily with a meal. Takes 500 mg twice daily.  Total of 1000 mg daily.) 180 tablet 3  . Misc. Devices MISC Please provide supplies (needle, syringe, alcohol swabs) needed for patient to self-administer B-12 injections monthly. 1 each 12  . pantoprazole (PROTONIX) 40 MG tablet TAKE ONE (1) TABLET 30 MINS BEFORE YOUR FIRST MEAL. 90 tablet 3  . potassium chloride (KLOR-CON) 10 MEQ tablet Take 1 tablet (10 mEq total) by mouth daily. 90 tablet 3  . ULTICARE TUBERCULIN SAFETY SYR 25G X 1" 1 ML MISC USE TO ADMINISTER INJECTABLE VITAMIN B12. 1 each 11   No current facility-administered medications for this visit.    Allergies as of 06/04/2020 - Review Complete 06/04/2020  Allergen Reaction Noted  . Bee venom Swelling 05/19/2016  . Codeine Anaphylaxis 04/30/2016  . Penicillins Swelling and Rash 03/07/2012  . Bupropion Other (See Comments) 10/04/2017  . Sudafed [pseudoephedrine hcl] Rash 06/27/2013    ROS:  General: Negative for anorexia, weight loss, fever, chills, fatigue, weakness.  See HPI ENT: Negative for hoarseness, difficulty swallowing , nasal congestion. CV: Negative for chest pain, angina, palpitations, dyspnea on exertion, peripheral edema.  Respiratory: Negative for dyspnea at rest, dyspnea on exertion, cough, sputum, wheezing.  GI: See history of present illness. GU:  Negative for dysuria, hematuria, urinary incontinence, urinary frequency, nocturnal urination.  Endo: Negative for unusual weight change.    Physical Examination:   BP (!) 156/72   Pulse 78   Temp (!) 97 F (36.1 C) (Oral)   Ht '5\' 4"'  (1.626 m)   Wt 145 lb 6.4 oz (66 kg)   BMI 24.96 kg/m   General: Well-nourished, well-developed in no acute distress.  Eyes: No icterus. Mouth: masked Lungs: Clear to auscultation bilaterally.  Heart: Regular rate and rhythm, no murmurs rubs or gallops.  Abdomen: Bowel sounds are normal,  nondistended, no hepatosplenomegaly or masses, no abdominal bruits or hernia , no  rebound or guarding.  Moderate epig tenderness and LLQ tenderness, ?fullness left of umbilicus Extremities: No lower extremity edema. No clubbing or deformities. Neuro: Alert and oriented x 4   Skin: Warm and dry, no jaundice.   Psych: Alert and cooperative, normal mood and affect.  Labs:  Lab Results  Component Value Date   CREATININE 0.66 05/19/2020   BUN 14 05/19/2020   NA 136 05/19/2020   K 4.3 05/19/2020   CL 103 05/19/2020   CO2 22 05/19/2020   Lab Results  Component Value Date   ALT 19 12/09/2019   AST 15 12/09/2019   ALKPHOS 103 12/09/2019   BILITOT 0.4 12/09/2019   Lab Results  Component Value Date   WBC 14.6 (H) 05/06/2020   HGB 14.6 05/06/2020   HCT 44.9 05/06/2020   MCV 94.9 05/06/2020   PLT 401 (H) 05/06/2020   Lab Results  Component Value Date   IRON 128 05/06/2020   TIBC 351 05/06/2020   FERRITIN  204 05/06/2020   Lab Results  Component Value Date   OIPPGFQM21 031 03/16/2020   Lab Results  Component Value Date   FOLATE 13.5 12/09/2019     Lab Results  Component Value Date   HGBA1C 8.7 (A) 12/23/2019      Imaging Studies: MM 3D SCREEN BREAST BILATERAL  Result Date: 05/24/2020 CLINICAL DATA:  Screening. EXAM: DIGITAL SCREENING BILATERAL MAMMOGRAM WITH TOMO AND CAD COMPARISON:  None. ACR Breast Density Category b: There are scattered areas of fibroglandular density. FINDINGS: There are no findings suspicious for malignancy. Images were processed with CAD. IMPRESSION: No mammographic evidence of malignancy. A result letter of this screening mammogram will be mailed directly to the patient. RECOMMENDATION: Screening mammogram in one year. (Code:SM-B-01Y) BI-RADS CATEGORY  1: Negative. Electronically Signed   By: Evangeline Dakin M.D.   On: 05/24/2020 16:16    Impression/plan:  64 year old female presenting for follow-up of diarrhea, iron deficiency anemia with prior history of peptic ulcer disease, gastric/duodenal AVMs.  Diarrhea: Some lactose  intolerance, with improvement on Lactaid.  Symptoms present since early 2020, really denies any previous history of IBS.  Gallbladder removed in 2018 but no significant issues with diarrhea postoperatively, cannot exclude an element of bile salt diarrhea.  She has a significant postprandial component with abdominal cramping and urgency.  Also has normal bowel movements.  This makes microscopic colitis less likely.  We will treat with antispasmodic, dicyclomine.  If no significant improvement, would consider updating colonoscopy for change in bowel habits.  IDA: Continues received iron infusions under the direction of hematology.  History of gastric and duodenal AVMs seen on prior upper endoscopy status post ablation.  Colonoscopy as outlined above.  Continue to follow with hematology.  Abdominal pain on exam today.  Moderate left lower quadrant and moderate epigastric tenderness.  Given chronic diarrhea, abdominal pain, history of diverticulosis, recommend CT abdomen and pelvis with contrast for further evaluation.

## 2020-06-08 ENCOUNTER — Ambulatory Visit: Payer: PPO | Admitting: Registered Nurse

## 2020-06-09 ENCOUNTER — Telehealth: Payer: Self-pay

## 2020-06-09 ENCOUNTER — Ambulatory Visit (HOSPITAL_COMMUNITY): Payer: PPO

## 2020-06-09 NOTE — Telephone Encounter (Signed)
Pt called, she is scheduled for a ct this evening. She has a glucose monitor- Freestyle Libre 14- and was wondering if it would interfere with the scan. I called radiology and spoke with them about it. Was told that it wouldn't interfere with the scan but the scan would mess up the glucose monitor and it would not read correctly and would need to be removed. Informed the pt, she stated she is scheduled to remove it next week to replace it and would need to reschedule the ct. I gave her the number to scheduling to have it rescheduled. Routing to [place] for [employer].

## 2020-06-09 NOTE — Telephone Encounter (Signed)
noted 

## 2020-06-17 ENCOUNTER — Ambulatory Visit (HOSPITAL_COMMUNITY)
Admission: RE | Admit: 2020-06-17 | Discharge: 2020-06-17 | Disposition: A | Discharge status: Home / Self Care | Payer: PPO | Source: Ambulatory Visit | Attending: Gastroenterology | Admitting: Gastroenterology

## 2020-06-17 ENCOUNTER — Ambulatory Visit
Admission: EM | Admit: 2020-06-17 | Discharge: 2020-06-17 | Disposition: A | Discharge status: Home / Self Care | Payer: PPO | Attending: Emergency Medicine | Admitting: Emergency Medicine

## 2020-06-17 ENCOUNTER — Inpatient Hospital Stay (HOSPITAL_COMMUNITY): Payer: PPO | Attending: Hematology

## 2020-06-17 ENCOUNTER — Encounter: Payer: Self-pay | Admitting: Emergency Medicine

## 2020-06-17 ENCOUNTER — Other Ambulatory Visit: Payer: Self-pay

## 2020-06-17 DIAGNOSIS — J449 Chronic obstructive pulmonary disease, unspecified: Secondary | ICD-10-CM | POA: Diagnosis not present

## 2020-06-17 DIAGNOSIS — E538 Deficiency of other specified B group vitamins: Secondary | ICD-10-CM | POA: Insufficient documentation

## 2020-06-17 DIAGNOSIS — I700 Atherosclerosis of aorta: Secondary | ICD-10-CM | POA: Diagnosis not present

## 2020-06-17 DIAGNOSIS — R300 Dysuria: Secondary | ICD-10-CM

## 2020-06-17 DIAGNOSIS — Z885 Allergy status to narcotic agent status: Secondary | ICD-10-CM | POA: Insufficient documentation

## 2020-06-17 DIAGNOSIS — Z7982 Long term (current) use of aspirin: Secondary | ICD-10-CM | POA: Insufficient documentation

## 2020-06-17 DIAGNOSIS — D509 Iron deficiency anemia, unspecified: Secondary | ICD-10-CM | POA: Insufficient documentation

## 2020-06-17 DIAGNOSIS — R1013 Epigastric pain: Secondary | ICD-10-CM | POA: Diagnosis not present

## 2020-06-17 DIAGNOSIS — Z880 Allergy status to penicillin: Secondary | ICD-10-CM | POA: Diagnosis not present

## 2020-06-17 DIAGNOSIS — R1032 Left lower quadrant pain: Secondary | ICD-10-CM | POA: Diagnosis not present

## 2020-06-17 DIAGNOSIS — I110 Hypertensive heart disease with heart failure: Secondary | ICD-10-CM | POA: Diagnosis not present

## 2020-06-17 DIAGNOSIS — F17210 Nicotine dependence, cigarettes, uncomplicated: Secondary | ICD-10-CM | POA: Diagnosis not present

## 2020-06-17 DIAGNOSIS — Z888 Allergy status to other drugs, medicaments and biological substances status: Secondary | ICD-10-CM | POA: Diagnosis not present

## 2020-06-17 DIAGNOSIS — R197 Diarrhea, unspecified: Secondary | ICD-10-CM | POA: Insufficient documentation

## 2020-06-17 DIAGNOSIS — Z79899 Other long term (current) drug therapy: Secondary | ICD-10-CM | POA: Insufficient documentation

## 2020-06-17 DIAGNOSIS — D72829 Elevated white blood cell count, unspecified: Secondary | ICD-10-CM | POA: Insufficient documentation

## 2020-06-17 DIAGNOSIS — G8929 Other chronic pain: Secondary | ICD-10-CM | POA: Diagnosis not present

## 2020-06-17 DIAGNOSIS — I509 Heart failure, unspecified: Secondary | ICD-10-CM | POA: Insufficient documentation

## 2020-06-17 DIAGNOSIS — I10 Essential (primary) hypertension: Secondary | ICD-10-CM | POA: Insufficient documentation

## 2020-06-17 DIAGNOSIS — R1030 Lower abdominal pain, unspecified: Secondary | ICD-10-CM | POA: Insufficient documentation

## 2020-06-17 DIAGNOSIS — Z9049 Acquired absence of other specified parts of digestive tract: Secondary | ICD-10-CM | POA: Diagnosis not present

## 2020-06-17 DIAGNOSIS — Z90710 Acquired absence of both cervix and uterus: Secondary | ICD-10-CM | POA: Insufficient documentation

## 2020-06-17 DIAGNOSIS — N390 Urinary tract infection, site not specified: Secondary | ICD-10-CM | POA: Insufficient documentation

## 2020-06-17 DIAGNOSIS — Z8249 Family history of ischemic heart disease and other diseases of the circulatory system: Secondary | ICD-10-CM | POA: Insufficient documentation

## 2020-06-17 DIAGNOSIS — Z8489 Family history of other specified conditions: Secondary | ICD-10-CM | POA: Diagnosis not present

## 2020-06-17 DIAGNOSIS — D500 Iron deficiency anemia secondary to blood loss (chronic): Secondary | ICD-10-CM

## 2020-06-17 LAB — CBC WITH DIFFERENTIAL/PLATELET
Abs Immature Granulocytes: 0.13 10*3/uL — ABNORMAL HIGH (ref 0.00–0.07)
Basophils Absolute: 0.1 10*3/uL (ref 0.0–0.1)
Basophils Relative: 1 %
Eosinophils Absolute: 0.2 10*3/uL (ref 0.0–0.5)
Eosinophils Relative: 1 %
HCT: 44.1 % (ref 36.0–46.0)
Hemoglobin: 14.2 g/dL (ref 12.0–15.0)
Immature Granulocytes: 1 %
Lymphocytes Relative: 13 %
Lymphs Abs: 1.9 10*3/uL (ref 0.7–4.0)
MCH: 31.4 pg (ref 26.0–34.0)
MCHC: 32.2 g/dL (ref 30.0–36.0)
MCV: 97.6 fL (ref 80.0–100.0)
Monocytes Absolute: 1.6 10*3/uL — ABNORMAL HIGH (ref 0.1–1.0)
Monocytes Relative: 10 %
Neutro Abs: 11.4 10*3/uL — ABNORMAL HIGH (ref 1.7–7.7)
Neutrophils Relative %: 74 %
Platelets: 423 10*3/uL — ABNORMAL HIGH (ref 150–400)
RBC: 4.52 MIL/uL (ref 3.87–5.11)
RDW: 14.4 % (ref 11.5–15.5)
WBC: 15.3 10*3/uL — ABNORMAL HIGH (ref 4.0–10.5)
nRBC: 0 % (ref 0.0–0.2)

## 2020-06-17 LAB — FERRITIN: Ferritin: 117 ng/mL (ref 11–307)

## 2020-06-17 LAB — POCT URINALYSIS DIP (MANUAL ENTRY)
Bilirubin, UA: NEGATIVE
Blood, UA: NEGATIVE
Glucose, UA: 100 mg/dL — AB
Ketones, POC UA: NEGATIVE mg/dL
Nitrite, UA: NEGATIVE
Protein Ur, POC: NEGATIVE mg/dL
Spec Grav, UA: <=1.005 — AB (ref 1.010–1.025)
Urobilinogen, UA: 0.2 E.U./dL
pH, UA: 6 (ref 5.0–8.0)

## 2020-06-17 LAB — IRON AND TIBC
Iron: 79 ug/dL (ref 28–170)
Saturation Ratios: 20 % (ref 10.4–31.8)
TIBC: 393 ug/dL (ref 250–450)
UIBC: 314 ug/dL

## 2020-06-17 MED ORDER — IOHEXOL 300 MG/ML  SOLN
100.0000 mL | Freq: Once | INTRAMUSCULAR | Status: AC | PRN
  Administered 2020-06-17: 100 mL via INTRAVENOUS

## 2020-06-17 MED ORDER — FLUCONAZOLE 150 MG PO TABS: 150.0000 mg | ORAL_TABLET | Freq: Once | ORAL | 0 refills | Status: AC

## 2020-06-17 MED ORDER — SULFAMETHOXAZOLE-TRIMETHOPRIM 800-160 MG PO TABS: 1.0000 | ORAL_TABLET | Freq: Two times a day (BID) | ORAL | 0 refills | Status: AC

## 2020-06-17 NOTE — Discharge Instructions (Addendum)
Urine culture sent.  We will call you with the results.   Push fluids and get plenty of rest.   Take antibiotic as directed and to completion Take OTC Azo as needed for symptomatic relief Follow up with PCP if symptoms persists Return here or go to ER if you have any new or worsening symptoms such as fever, worsening abdominal pain, nausea/vomiting, flank pain,

## 2020-06-17 NOTE — ED Provider Notes (Signed)
[facility]   CC: Burning with urination  SUBJECTIVE:  Tammy Mcpherson is a 64 y.o. adult who complains of urinary frequency, urgency and dysuria for the past 2 to 3 days.  Patient denies a precipitating event, recent sexual encounter, excessive caffeine intake.  Localizes the pain to the lower abdomen.  Pain is intermittent and described as achy in character.  Has tried OTC medications without relief.  Symptoms are made worse with urination.  Admits to similar symptoms in the past.  Denies fever, chills, nausea, vomiting, abdominal pain, flank pain, abnormal vaginal discharge or bleeding, hematuria.    LMP: No LMP recorded. Patient has had a hysterectomy.  ROS: As in HPI.  All other pertinent ROS negative.     Past Medical History:  Diagnosis Date  . Anemia 04/30/2016  . Anxiety   . Blood transfusion without reported diagnosis   . Cardiomyopathy ([place])    a. EF 40-45% by echo in 04/2016 b. Improved to 60-65% by repeat imaging in 2018  . CHF (congestive heart failure) ([place])   . COPD (chronic obstructive pulmonary disease) ([place])   . Coronary artery calcification seen on CT scan   . Essential hypertension   . GERD (gastroesophageal reflux disease)   . History of bronchitis   . History of GI bleed   . Hyperlipidemia   . Iron deficiency anemia 05/12/2016   Bleeding ulcers  . Leukocytosis 03/23/2020  . Pollen allergy   . PSVT (paroxysmal supraventricular tachycardia) ([place])   . PVC's (premature ventricular contractions)   . Thrombocytosis ([place]) 03/23/2020  . Vitamin B12 deficiency 03/23/2020   Past Surgical History:  Procedure Laterality Date  . ABDOMINAL HYSTERECTOMY     polyps  about age 84  . Barbie Banner OSTEOTOMY Right 07/10/2013   Procedure: Barbie Banner OSTEOTOMY RIGHT FOOT;  Surgeon: Marcheta Grammes, DPM;  Location: [facility];  Service: Orthopedics;  Laterality: Right;  . AMPUTATION Left 05/09/2017   Procedure: AMPUTATION 5TH TOE LEFT FOOT;  Surgeon: [name], DPM;   Location: [facility];  Service: Podiatry;  Laterality: Left;  . AMPUTATION Left 06/13/2017   Procedure: AMPUTATION 2ND TOE LEFT FOOT;  Surgeon: [name], DPM;  Location: [facility];  Service: Podiatry;  Laterality: Left;  . BUNIONECTOMY Right 07/10/2013   Procedure: VOGLER BUNIONECTOMY RIGHT FOOT;  Surgeon: Marcheta Grammes, DPM;  Location: [facility];  Service: Orthopedics;  Laterality: Right;  . CHOLECYSTECTOMY N/A 08/22/2017   Procedure: LAPAROSCOPIC CHOLECYSTECTOMY;  Surgeon: Virl Cagey, MD;  Location: [facility];  Service: General;  Laterality: N/A;  . COLONOSCOPY N/A 07/07/2016   Dr. Oneida Alar; redundant left colon, diverticulosis at hepatic flexure, non-bleeding internal hemorrhoids  . ESOPHAGOGASTRODUODENOSCOPY N/A 07/07/2016   Dr. Oneida Alar: many non-bleeding cratered gastric ulcers without stigmata of bleeding in gastric antrum. four 2-3 mm angioectasias without bleeding in duodenal bulb and second portion of duodenum s/p APC. Chroni gastritis on path.   . ESOPHAGOGASTRODUODENOSCOPY N/A 08/03/2017   Dr. Oneida Alar: erosive gastritis, AVMs. Found a single non-bleeding angioectasia in stomach, s/p APC therapy. Four non-bleeding angioectasias in duodenum s/p APC. Non-bleeding erosive gastropathy  . IR ANGIOGRAM EXTREMITY LEFT  04/24/2017  . IR FEM POP ART ATHERECT INC PTA MOD SED  04/24/2017  . IR INFUSION THROMBOL ARTERIAL INITIAL (MS)  04/24/2017  . IR RADIOLOGIST EVAL & MGMT  12/05/2016  . IR US GUIDE VASC ACCESS RIGHT  04/24/2017  . LIVER BIOPSY N/A 08/22/2017   Procedure: LIVER BIOPSY;  Surgeon: Virl Cagey, MD;  Location:  [facility];  Service: General;  Laterality: N/A;  . METATARSAL HEAD EXCISION Right 07/10/2013   Procedure: METATARSAL HEAD RESECTION OF DIGITS 2 AND 3 RIGHT FOOT;  Surgeon: Marcheta Grammes, DPM;  Location: [facility];  Service: Orthopedics;  Laterality: Right;  . PROXIMAL INTERPHALANGEAL FUSION (PIP) Right 07/10/2013   Procedure: ARTHRODESIS PIPJ  2ND DIGIT RIGHT FOOT;   Surgeon: Marcheta Grammes, DPM;  Location: [facility];  Service: Orthopedics;  Laterality: Right;   Allergies  Allergen Reactions  . Bee Venom Swelling  . Codeine Anaphylaxis    Tongue swelled  . Penicillins Swelling and Rash    Has patient had a PCN reaction causing immediate rash, facial/tongue/throat swelling, SOB or lightheadedness with hypotension: No, delayed Has patient had a PCN reaction causing severe rash involving mucus membranes or skin necrosis: No Has patient had a PCN reaction that required hospitalization No Has patient had a PCN reaction occurring within the last 10 years: No If all of the above answers are "NO", then may proceed with Cephalosporin use.   . Bupropion Other (See Comments)    "Made my skin crawl"  . Sudafed [Pseudoephedrine Hcl] Rash   No current facility-administered medications on file prior to encounter.   Current Outpatient Medications on File Prior to Encounter  Medication Sig Dispense Refill  . acetaminophen (TYLENOL) 500 MG tablet Take 500 mg by mouth at bedtime as needed for moderate pain.     Marland Kitchen albuterol (PROVENTIL) (2.5 MG/3ML) 0.083% nebulizer solution Take 3 mLs (2.5 mg total) by nebulization every 4 (four) hours as needed for wheezing or shortness of breath. 75 mL 5  . aspirin 81 MG chewable tablet Chew 81 mg by mouth every morning.     Marland Kitchen atorvastatin (LIPITOR) 20 MG tablet TAKE ONE TABLET (20MG TOTAL) BY MOUTH DAILY 90 tablet 3  . bisoprolol (ZEBETA) 10 MG tablet TAKE ONE (1) TABLET BY MOUTH EVERY DAY 90 tablet 3  . Budeson-Glycopyrrol-Formoterol (BREZTRI AEROSPHERE) 160-9-4.8 MCG/ACT AERO Inhale 2 puffs into the lungs 2 (two) times daily. 11.8 g 0  . Cholecalciferol (VITAMIN D) 50 MCG (2000 UT) CAPS Take by mouth daily.    . Continuous Blood Gluc Receiver (FREESTYLE LIBRE 14 DAY READER) DEVI See admin instructions. 6 each 1  . Continuous Blood Gluc Sensor (FREESTYLE LIBRE 14 DAY SENSOR) MISC USE AS DIRECTED 6 each 0  . cyanocobalamin  (,VITAMIN B-12,) 1000 MCG/ML injection Inject 1 mL (1,000 mcg total) into the muscle every 30 (thirty) days. 1 mL 11  . dicyclomine (BENTYL) 10 MG capsule Take 1 capsule (10 mg total) by mouth as needed for spasms (30 mins before meals and at bedtime as needed for diarrhea and abdominal cramps.). Take no more than four times daily but take only as needed 120 capsule 1  . empagliflozin (JARDIANCE) 10 MG TABS tablet Take 10 mg by mouth daily before breakfast. 30 tablet 4  . gabapentin (NEURONTIN) 300 MG capsule TAKE ONE CAPSULE (300 MG TOTAL) BY MOUTHTWO TIMES DAILY. 180 capsule 0  . glucose blood (ONETOUCH ULTRA) test strip Use as instructed 100 each 12  . glucose monitoring kit (FREESTYLE) monitoring kit Use to check blood sugar BID as directed 1 each 0  . lactase (LACTAID) 3000 units tablet Take by mouth as needed.    . Lancets (ONETOUCH DELICA PLUS [id number]) MISC 1 each by Does not apply route 2 (two) times a day. 100 each 3  . losartan (COZAAR) 100 MG tablet TAKE ONE (1) TABLET BY MOUTH  EVERY DAY 90 tablet 3  . metFORMIN (GLUCOPHAGE) 1000 MG tablet Take 1 tablet (1,000 mg total) by mouth 2 (two) times daily with a meal. (Patient taking differently: Take 500 mg by mouth 2 (two) times daily with a meal. Takes 500 mg twice daily.  Total of 1000 mg daily.) 180 tablet 3  . Misc. Devices MISC Please provide supplies (needle, syringe, alcohol swabs) needed for patient to self-administer B-12 injections monthly. 1 each 12  . pantoprazole (PROTONIX) 40 MG tablet TAKE ONE (1) TABLET 30 MINS BEFORE YOUR FIRST MEAL. 90 tablet 3  . potassium chloride (KLOR-CON) 10 MEQ tablet Take 1 tablet (10 mEq total) by mouth daily. 90 tablet 3  . ULTICARE TUBERCULIN SAFETY SYR 25G X 1" 1 ML MISC USE TO ADMINISTER INJECTABLE VITAMIN B12. 1 each 11   Social History   Socioeconomic History  . Marital status: Married    Spouse name: [name]  . Number of children: 1  . Years of education: 32  . Highest education level: Not  on file  Occupational History  . Occupation: disabled    Comment: heart  Tobacco Use  . Smoking status: Light Tobacco Smoker    Packs/day: 0.25    Years: 44.00    Pack years: 11.00    Types: Cigarettes    Start date: 05/10/1975  . Smokeless tobacco: Never Used  . Tobacco comment: 2 cigarettes per day 12/10/2019  Vaping Use  . Vaping Use: Former  Substance and Sexual Activity  . Alcohol use: Not Currently    Alcohol/week: 2.0 standard drinks    Types: 2 Glasses of wine per week  . Drug use: No  . Sexual activity: Yes    Birth control/protection: Surgical  Other Topics Concern  . Not on file  Social History Narrative   Disabled   Lives with husband [name]   Two dogs   Social Determinants of Health   Financial Resource Strain:   . Difficulty of Paying Living Expenses: Not on file  Food Insecurity:   . Worried About Charity fundraiser in the Last Year: Not on file  . Ran Out of Food in the Last Year: Not on file  Transportation Needs:   . Lack of Transportation (Medical): Not on file  . Lack of Transportation (Non-Medical): Not on file  Physical Activity:   . Days of Exercise per Week: Not on file  . Minutes of Exercise per Session: Not on file  Stress:   . Feeling of Stress : Not on file  Social Connections:   . Frequency of Communication with Friends and Family: Not on file  . Frequency of Social Gatherings with Friends and Family: Not on file  . Attends Religious Services: Not on file  . Active Member of Clubs or Organizations: Not on file  . Attends Archivist Meetings: Not on file  . Marital Status: Not on file  Intimate Partner Violence:   . Fear of Current or Ex-Partner: Not on file  . Emotionally Abused: Not on file  . Physically Abused: Not on file  . Sexually Abused: Not on file   Family History  Adopted: Yes  Problem Relation Age of Onset  . Hypertension Father   . Coronary artery disease Sister   . Ulcers Maternal Grandmother   . Colon  cancer Neg Hx        unknown, was adopted    OBJECTIVE:  Vitals:   06/17/20 1515 06/17/20 1517  BP: (!) 151/80  Pulse: 79   Resp: 20   Temp: 97.7 F (36.5 C)   TempSrc: Oral   SpO2: 94%   Weight:  146 lb (66.2 kg)  Height:  5' 4" (1.626 m)   General appearance: AOx3 in no acute distress HEENT: NCAT.  Oropharynx clear.  Lungs: clear to auscultation bilaterally without adventitious breath sounds Heart: regular rate and rhythm.  Radial pulses 2+ symmetrical bilaterally Abdomen: soft; non-distended; no tenderness; bowel sounds present; no guarding or rebound tenderness Back: no CVA tenderness Extremities: no edema; symmetrical with no gross deformities Skin: warm and dry Neurologic: Ambulates from chair to exam table without difficulty Psychological: alert and cooperative; normal mood and affect  Labs Reviewed  POCT URINALYSIS DIP (MANUAL ENTRY) - Abnormal; Notable for the following components:      Result Value   Color, UA light yellow (*)    Glucose, UA =100 (*)    Spec Grav, UA <=1.005 (*)    Leukocytes, UA Moderate (2+) (*)    All other components within normal limits  URINE CULTURE    ASSESSMENT & PLAN:  1. Dysuria   2. Acute lower UTI     Meds ordered this encounter  Medications  . sulfamethoxazole-trimethoprim (BACTRIM DS) 800-160 MG tablet    Sig: Take 1 tablet by mouth 2 (two) times daily for 7 days.    Dispense:  14 tablet    Refill:  0  . fluconazole (DIFLUCAN) 150 MG tablet    Sig: Take 1 tablet (150 mg total) by mouth once for 1 dose. May take the second dose 72 hours after the first if symptom does not resolve    Dispense:  2 tablet    Refill:  0    Urine culture sent.  We will call you with the results.   Push fluids and get plenty of rest.   Take antibiotic as directed and to completion Take OTC Azo as needed for symptomatic relief Follow up with PCP if symptoms persists Return here or go to ER if you have any new or worsening symptoms such  as fever, worsening abdominal pain, nausea/vomiting, flank pain, etc...  Outlined signs and symptoms indicating need for more acute intervention. Patient verbalized understanding. After Visit Summary given.      Emerson Monte, [place] 06/17/20 1554

## 2020-06-17 NOTE — ED Triage Notes (Signed)
Burns with urination and frequency x 1 week

## 2020-06-18 ENCOUNTER — Inpatient Hospital Stay (HOSPITAL_COMMUNITY): Payer: PPO | Attending: Hematology | Admitting: Nurse Practitioner

## 2020-06-18 DIAGNOSIS — E538 Deficiency of other specified B group vitamins: Secondary | ICD-10-CM | POA: Insufficient documentation

## 2020-06-18 DIAGNOSIS — K922 Gastrointestinal hemorrhage, unspecified: Secondary | ICD-10-CM | POA: Diagnosis not present

## 2020-06-18 DIAGNOSIS — Z79899 Other long term (current) drug therapy: Secondary | ICD-10-CM | POA: Insufficient documentation

## 2020-06-18 DIAGNOSIS — F17210 Nicotine dependence, cigarettes, uncomplicated: Secondary | ICD-10-CM | POA: Diagnosis not present

## 2020-06-18 DIAGNOSIS — D72829 Elevated white blood cell count, unspecified: Secondary | ICD-10-CM | POA: Diagnosis not present

## 2020-06-18 DIAGNOSIS — Z800 Family history of malignant neoplasm of digestive organs: Secondary | ICD-10-CM | POA: Diagnosis not present

## 2020-06-18 DIAGNOSIS — D500 Iron deficiency anemia secondary to blood loss (chronic): Secondary | ICD-10-CM | POA: Diagnosis not present

## 2020-06-18 DIAGNOSIS — E119 Type 2 diabetes mellitus without complications: Secondary | ICD-10-CM | POA: Diagnosis not present

## 2020-06-18 DIAGNOSIS — Z8249 Family history of ischemic heart disease and other diseases of the circulatory system: Secondary | ICD-10-CM | POA: Insufficient documentation

## 2020-06-18 DIAGNOSIS — D473 Essential (hemorrhagic) thrombocythemia: Secondary | ICD-10-CM | POA: Insufficient documentation

## 2020-06-18 NOTE — Assessment & Plan Note (Addendum)
1.  Iron deficiency anemia: - Likely due to from chronic GI blood loss from gastric and small bowel AVMs, patient was on aspirin and Plavix. -Plavix was discontinued in October 2019.  She does continue taking her aspirin 81 mg daily -She has a history of receiving multiple blood transfusions. -She denies any bleeding per rectum or melena.  She does complain of fatigue. - Her last Feraheme infusion was on 03/25/2020 and 04/01/2020. - She had labs on 06/17/2020 showed her hemoglobin 14.2, ferritin 117, percent saturation 20. -We will set her up with 2 iron infusions due to her extreme fatigue.  -We will see her back in 4 months with repeat labs.  2.  Leukocytosis and thrombocytosis: - Her [place] count has been elevated since 2004, predominantly neutrophils.  She also has had a elevated platelet count. - Her blood work was evaluated and BCR/ABL by FISH and Jak 2 V6 17 F testing were negative ruling out myeloproliferative disorders. -Labs on 06/17/2020 showed her WBC at 15.3 and platelets at 423. -She is on steroid inhalers which could be contributing to his leukocytosis. -We will continue to monitor.  3.  B12 deficiency: - Labs on 09/03/2019 showed her B12 level at 460. -She has been getting monthly B12 injections. -We will continue to monitor her levels.

## 2020-06-18 NOTE — Progress Notes (Signed)
[place], [place] [address]   CLINIC:  Medical Oncology/Hematology  PCP:  [name], NP [place] [id number] [phone]   REASON FOR VISIT: Follow-up for iron deficiency anemia   CURRENT THERAPY: Intermittent IV iron   INTERVAL HISTORY:  Tammy Mcpherson 64 y.o. adult returns for routine follow-up for iron deficiency anemia.  Patient reports she is doing well since her last visit.  She denies any bright red bleeding per rectum or melena.  She denies any easy bruising or bleeding. Denies any nausea, vomiting, or diarrhea. Denies any new pains. Had not noticed any recent bleeding such as epistaxis, hematuria or hematochezia. Denies recent chest pain on exertion, shortness of breath on minimal exertion, pre-syncopal episodes, or palpitations. Denies any numbness or tingling in hands or feet. Denies any recent fevers, infections, or recent hospitalizations. Patient reports appetite at 100% and energy level at 75%.  She is eating well maintain her weight this time.     REVIEW OF SYSTEMS:  Review of Systems  All other systems reviewed and are negative.    PAST MEDICAL/SURGICAL HISTORY:  Past Medical History:  Diagnosis Date   Anemia 04/30/2016   Anxiety    Blood transfusion without reported diagnosis    Cardiomyopathy ([place])    a. EF 40-45% by echo in 04/2016 b. Improved to 60-65% by repeat imaging in 2018   CHF (congestive heart failure) (HCC)    COPD (chronic obstructive pulmonary disease) (HCC)    Coronary artery calcification seen on CT scan    Essential hypertension    GERD (gastroesophageal reflux disease)    History of bronchitis    History of GI bleed    Hyperlipidemia    Iron deficiency anemia 05/12/2016   Bleeding ulcers   Leukocytosis 03/23/2020   Pollen allergy    PSVT (paroxysmal supraventricular tachycardia) (HCC)    PVC's (premature ventricular contractions)    Thrombocytosis ([place])  03/23/2020   Vitamin B12 deficiency 03/23/2020   Past Surgical History:  Procedure Laterality Date   ABDOMINAL HYSTERECTOMY     polyps  about age 89   AIKEN OSTEOTOMY Right 07/10/2013   Procedure: Barbie Banner OSTEOTOMY RIGHT FOOT;  Surgeon: Marcheta Grammes, DPM;  Location: [facility];  Service: Orthopedics;  Laterality: Right;   AMPUTATION Left 05/09/2017   Procedure: AMPUTATION 5TH TOE LEFT FOOT;  Surgeon: [name], DPM;  Location: [facility];  Service: Podiatry;  Laterality: Left;   AMPUTATION Left 06/13/2017   Procedure: AMPUTATION 2ND TOE LEFT FOOT;  Surgeon: [name], DPM;  Location: [facility];  Service: Podiatry;  Laterality: Left;   BUNIONECTOMY Right 07/10/2013   Procedure: VOGLER BUNIONECTOMY RIGHT FOOT;  Surgeon: Marcheta Grammes, DPM;  Location: [facility];  Service: Orthopedics;  Laterality: Right;   CHOLECYSTECTOMY N/A 08/22/2017   Procedure: LAPAROSCOPIC CHOLECYSTECTOMY;  Surgeon: Virl Cagey, MD;  Location: [facility];  Service: General;  Laterality: N/A;   COLONOSCOPY N/A 07/07/2016   Dr. Oneida Alar; redundant left colon, diverticulosis at hepatic flexure, non-bleeding internal hemorrhoids   ESOPHAGOGASTRODUODENOSCOPY N/A 07/07/2016   Dr. Oneida Alar: many non-bleeding cratered gastric ulcers without stigmata of bleeding in gastric antrum. four 2-3 mm angioectasias without bleeding in duodenal bulb and second portion of duodenum s/p APC. Chroni gastritis on path.    ESOPHAGOGASTRODUODENOSCOPY N/A 08/03/2017   Dr. Oneida Alar: erosive gastritis, AVMs. Found a single non-bleeding angioectasia in stomach, s/p APC therapy. Four non-bleeding angioectasias in duodenum s/p APC. Non-bleeding erosive gastropathy  IR ANGIOGRAM EXTREMITY LEFT  04/24/2017   IR FEM POP ART ATHERECT INC PTA MOD SED  04/24/2017   IR INFUSION THROMBOL ARTERIAL INITIAL (MS)  04/24/2017   IR RADIOLOGIST EVAL & MGMT  12/05/2016   IR US GUIDE VASC ACCESS RIGHT  04/24/2017   LIVER BIOPSY N/A 08/22/2017    Procedure: LIVER BIOPSY;  Surgeon: Virl Cagey, MD;  Location: [facility];  Service: General;  Laterality: N/A;   METATARSAL HEAD EXCISION Right 07/10/2013   Procedure: METATARSAL HEAD RESECTION OF DIGITS 2 AND 3 RIGHT FOOT;  Surgeon: Marcheta Grammes, DPM;  Location: [facility];  Service: Orthopedics;  Laterality: Right;   PROXIMAL INTERPHALANGEAL FUSION (PIP) Right 07/10/2013   Procedure: ARTHRODESIS PIPJ  2ND DIGIT RIGHT FOOT;  Surgeon: Marcheta Grammes, DPM;  Location: [facility];  Service: Orthopedics;  Laterality: Right;     SOCIAL HISTORY:  Social History   Socioeconomic History   Marital status: Married    Spouse name: [name]   Number of children: 1   Years of education: 12   Highest education level: Not on file  Occupational History   Occupation: disabled    Comment: heart  Tobacco Use   Smoking status: Light Tobacco Smoker    Packs/day: 0.25    Years: 44.00    Pack years: 11.00    Types: Cigarettes    Start date: 05/10/1975   Smokeless tobacco: Never Used   Tobacco comment: 2 cigarettes per day 12/10/2019  Vaping Use   Vaping Use: Former  Substance and Sexual Activity   Alcohol use: Not Currently    Alcohol/week: 2.0 standard drinks    Types: 2 Glasses of wine per week   Drug use: No   Sexual activity: Yes    Birth control/protection: Surgical  Other Topics Concern   Not on file  Social History Narrative   Disabled   Lives with husband [name]   Two dogs   Social Determinants of Health   Financial Resource Strain:    Difficulty of Paying Living Expenses: Not on file  Food Insecurity:    Worried About Charity fundraiser in the Last Year: Not on file   [employer] of Food in the Last Year: Not on file  Transportation Needs:    Lack of Transportation (Medical): Not on file   Lack of Transportation (Non-Medical): Not on file  Physical Activity:    Days of Exercise per Week: Not on file   Minutes of Exercise per Session: Not on file    Stress:    Feeling of Stress : Not on file  Social Connections:    Frequency of Communication with Friends and Family: Not on file   Frequency of Social Gatherings with Friends and Family: Not on file   Attends Religious Services: Not on file   Active Member of Clubs or Organizations: Not on file   Attends Archivist Meetings: Not on file   Marital Status: Not on file  Intimate Partner Violence:    Fear of Current or Ex-Partner: Not on file   Emotionally Abused: Not on file   Physically Abused: Not on file   Sexually Abused: Not on file    FAMILY HISTORY:  Family History  Adopted: Yes  Problem Relation Age of Onset   Hypertension Father    Coronary artery disease Sister    Ulcers Maternal Grandmother    Colon cancer Neg Hx        unknown, was adopted  CURRENT MEDICATIONS:  Outpatient Encounter Medications as of 06/18/2020  Medication Sig   acetaminophen (TYLENOL) 500 MG tablet Take 500 mg by mouth at bedtime as needed for moderate pain.    albuterol (PROVENTIL) (2.5 MG/3ML) 0.083% nebulizer solution Take 3 mLs (2.5 mg total) by nebulization every 4 (four) hours as needed for wheezing or shortness of breath.   aspirin 81 MG chewable tablet Chew 81 mg by mouth every morning.    atorvastatin (LIPITOR) 20 MG tablet TAKE ONE TABLET (20MG TOTAL) BY MOUTH DAILY   bisoprolol (ZEBETA) 10 MG tablet TAKE ONE (1) TABLET BY MOUTH EVERY DAY   Budeson-Glycopyrrol-Formoterol (BREZTRI AEROSPHERE) 160-9-4.8 MCG/ACT AERO Inhale 2 puffs into the lungs 2 (two) times daily.   Cholecalciferol (VITAMIN D) 50 MCG (2000 UT) CAPS Take by mouth daily.   Continuous Blood Gluc Receiver (FREESTYLE LIBRE 14 DAY READER) DEVI See admin instructions.   Continuous Blood Gluc Sensor (FREESTYLE LIBRE 14 DAY SENSOR) MISC USE AS DIRECTED   cyanocobalamin (,VITAMIN B-12,) 1000 MCG/ML injection Inject 1 mL (1,000 mcg total) into the muscle every 30 (thirty) days.   dicyclomine  (BENTYL) 10 MG capsule Take 1 capsule (10 mg total) by mouth as needed for spasms (30 mins before meals and at bedtime as needed for diarrhea and abdominal cramps.). Take no more than four times daily but take only as needed   empagliflozin (JARDIANCE) 10 MG TABS tablet Take 10 mg by mouth daily before breakfast.   gabapentin (NEURONTIN) 300 MG capsule TAKE ONE CAPSULE (300 MG TOTAL) BY MOUTHTWO TIMES DAILY.   glucose blood (ONETOUCH ULTRA) test strip Use as instructed   glucose monitoring kit (FREESTYLE) monitoring kit Use to check blood sugar BID as directed   lactase (LACTAID) 3000 units tablet Take by mouth as needed.   Lancets (ONETOUCH DELICA PLUS [id number]) MISC 1 each by Does not apply route 2 (two) times a day.   losartan (COZAAR) 100 MG tablet TAKE ONE (1) TABLET BY MOUTH EVERY DAY   metFORMIN (GLUCOPHAGE) 1000 MG tablet Take 1 tablet (1,000 mg total) by mouth 2 (two) times daily with a meal. (Patient taking differently: Take 500 mg by mouth 2 (two) times daily with a meal. Takes 500 mg twice daily.  Total of 1000 mg daily.)   Misc. Devices MISC Please provide supplies (needle, syringe, alcohol swabs) needed for patient to self-administer B-12 injections monthly.   pantoprazole (PROTONIX) 40 MG tablet TAKE ONE (1) TABLET 30 MINS BEFORE YOUR FIRST MEAL.   potassium chloride (KLOR-CON) 10 MEQ tablet Take 1 tablet (10 mEq total) by mouth daily.   sulfamethoxazole-trimethoprim (BACTRIM DS) 800-160 MG tablet Take 1 tablet by mouth 2 (two) times daily for 7 days.   [employer]ARE TUBERCULIN SAFETY SYR 25G X 1" 1 ML MISC USE TO ADMINISTER INJECTABLE VITAMIN B12.   No facility-administered encounter medications on file as of 06/18/2020.    ALLERGIES:  Allergies  Allergen Reactions   Bee Venom Swelling   Codeine Anaphylaxis    Tongue swelled   Penicillins Swelling and Rash    Has patient had a PCN reaction causing immediate rash, facial/tongue/throat swelling, SOB or  lightheadedness with hypotension: No, delayed Has patient had a PCN reaction causing severe rash involving mucus membranes or skin necrosis: No Has patient had a PCN reaction that required hospitalization No Has patient had a PCN reaction occurring within the last 10 years: No If all of the above answers are "NO", then may proceed with Cephalosporin use.  Bupropion Other (See Comments)    "Made my skin crawl"   Sudafed [Pseudoephedrine Hcl] Rash     PHYSICAL EXAM:  ECOG Performance status: 1  Vitals:   06/18/20 1334  BP: (!) 148/76  Pulse: 88  Resp: 16  Temp: (!) 97.3 F (36.3 C)  SpO2: 93%   Filed Weights   06/18/20 1334  Weight: 146 lb 1.6 oz (66.3 kg)      Physical Exam Constitutional:      Appearance: Normal appearance. She is normal weight.  Cardiovascular:     Rate and Rhythm: Normal rate and regular rhythm.     Heart sounds: Normal heart sounds.  Pulmonary:     Effort: Pulmonary effort is normal.     Breath sounds: Normal breath sounds.  Abdominal:     General: Bowel sounds are normal.     Palpations: Abdomen is soft.  Musculoskeletal:        General: Normal range of motion.  Skin:    General: Skin is warm.  Neurological:     Mental Status: She is alert and oriented to person, place, and time. Mental status is at baseline.  Psychiatric:        Mood and Affect: Mood normal.        Behavior: Behavior normal.        Thought Content: Thought content normal.        Judgment: Judgment normal.      LABORATORY DATA:  I have reviewed the labs as listed.  CBC    Component Value Date/Time   WBC 15.3 (H) 06/17/2020 1319   RBC 4.52 06/17/2020 1319   HGB 14.2 06/17/2020 1319   HGB 11.8 07/02/2019 1539   HCT 44.1 06/17/2020 1319   HCT 35.9 07/02/2019 1539   PLT 423 (H) 06/17/2020 1319   PLT 518 (H) 07/02/2019 1539   MCV 97.6 06/17/2020 1319   MCV 86 07/02/2019 1539   MCH 31.4 06/17/2020 1319   MCHC 32.2 06/17/2020 1319   RDW 14.4 06/17/2020 1319     RDW 13.6 07/02/2019 1539   LYMPHSABS 1.9 06/17/2020 1319   LYMPHSABS 1.8 07/02/2019 1539   MONOABS 1.6 (H) 06/17/2020 1319   EOSABS 0.2 06/17/2020 1319   EOSABS 0.1 07/02/2019 1539   BASOSABS 0.1 06/17/2020 1319   BASOSABS 0.1 07/02/2019 1539   CMP Latest Ref Rng & Units 05/19/2020 12/09/2019 10/13/2019  Glucose 70 - 99 mg/dL 254(H) 208(H) 201(H)  BUN 8 - 23 mg/dL '14 9 8  ' Creatinine 0.44 - 1.00 mg/dL 0.66 0.62 0.60  Sodium 135 - 145 mmol/L 136 141 139  Potassium 3.5 - 5.1 mmol/L 4.3 3.8 4.5  Chloride 98 - 111 mmol/L 103 105 104  CO2 22 - 32 mmol/L '22 25 21  ' Calcium 8.9 - 10.3 mg/dL 9.4 9.3 10.2  Total Protein 6.5 - 8.1 g/dL - 7.2 7.0  Total Bilirubin 0.3 - 1.2 mg/dL - 0.4 0.3  Alkaline Phos 38 - 126 U/L - 103 124(H)  AST 15 - 41 U/L - 15 14  ALT 0 - 44 U/L - 19 19    All questions were answered to patient's stated satisfaction. Encouraged patient to call with any new concerns or questions before his next visit to the cancer center and we can certain see him sooner, if needed.     ASSESSMENT & PLAN:  Anemia, iron deficiency 1.  Iron deficiency anemia: - Likely due to from chronic GI blood loss from gastric and small bowel AVMs, patient  was on aspirin and Plavix. -Plavix was discontinued in October 2019.  She does continue taking her aspirin 81 mg daily -She has a history of receiving multiple blood transfusions. -She denies any bleeding per rectum or melena.  She does complain of fatigue. - Her last Feraheme infusion was on 03/25/2020 and 04/01/2020. - She had labs on 06/17/2020 showed her hemoglobin 14.2, ferritin 117, percent saturation 20. -We will set her up with 2 iron infusions due to her extreme fatigue.  -We will see her back in 4 months with repeat labs.  2.  Leukocytosis and thrombocytosis: - Her Im count has been elevated since 2004, predominantly neutrophils.  She also has had a elevated platelet count. - Her blood work was evaluated and BCR/ABL by FISH and Jak 2 V6  17 F testing were negative ruling out myeloproliferative disorders. -Labs on 06/17/2020 showed her WBC at 15.3 and platelets at 423. -She is on steroid inhalers which could be contributing to his leukocytosis. -We will continue to monitor.  3.  B12 deficiency: - Labs on 09/03/2019 showed her B12 level at 460. -She has been getting monthly B12 injections. -We will continue to monitor her levels.       Orders placed this encounter:  Orders Placed This Encounter  Procedures   Lactate dehydrogenase   CBC with Differential/Platelet   Comprehensive metabolic panel   Ferritin   Iron and TIBC   Vitamin B12   VITAMIN D 25 Hydroxy (Vit-D Deficiency, Fractures)      Francene Finders, FNP-C [place] [phone]

## 2020-06-19 LAB — URINE CULTURE: Culture: >=100000 — AB

## 2020-06-20 ENCOUNTER — Telehealth (HOSPITAL_COMMUNITY): Payer: Self-pay | Admitting: *Deleted

## 2020-06-20 MED ORDER — NITROFURANTOIN MONOHYD MACRO 100 MG PO CAPS
100.0000 mg | ORAL_CAPSULE | Freq: Two times a day (BID) | ORAL | 0 refills | Status: AC
Start: 2020-06-20 — End: 2020-06-25

## 2020-06-20 NOTE — Telephone Encounter (Signed)
Tammy Mcpherson,   We have received the results of your urine culture, ensuring we treated you with the correct antibiotic for the bacteria causing your UTI. The antibiotic prescribed is not sensitive to the bacteria growing and we will need to change it to Nitrofurantoin 100mg  PO twice a day x 5 days. This has been sent to the [facility].   Please complete the course of the antibiotic and let us know if you continue to have symptoms after the antibiotic is completed. We hope you are feeling better.   [phone]  Thank you, Latrelle Dodrill, RN

## 2020-06-25 ENCOUNTER — Other Ambulatory Visit: Payer: Self-pay

## 2020-06-25 ENCOUNTER — Inpatient Hospital Stay (HOSPITAL_COMMUNITY): Payer: PPO

## 2020-06-25 ENCOUNTER — Encounter (HOSPITAL_COMMUNITY): Payer: Self-pay

## 2020-06-25 VITALS — BP 164/56 | HR 70 | Temp 96.9°F | Resp 17

## 2020-06-25 DIAGNOSIS — D508 Other iron deficiency anemias: Secondary | ICD-10-CM

## 2020-06-25 DIAGNOSIS — D509 Iron deficiency anemia, unspecified: Secondary | ICD-10-CM | POA: Diagnosis not present

## 2020-06-25 MED ORDER — SODIUM CHLORIDE 0.9 % IV SOLN: INTRAVENOUS | Status: DC

## 2020-06-25 MED ORDER — SODIUM CHLORIDE 0.9 % IV SOLN
510.0000 mg | Freq: Once | INTRAVENOUS | Status: AC
  Administered 2020-06-25: 510 mg via INTRAVENOUS
  Filled 2020-06-25: qty 17

## 2020-06-25 NOTE — Progress Notes (Signed)
Iron infusion given per orders. Patient tolerated it well without problems. Vitals stable and discharged home from clinic ambulatory in stable condition. Follow up as scheduled.  

## 2020-06-28 ENCOUNTER — Other Ambulatory Visit: Payer: Self-pay | Admitting: *Deleted

## 2020-06-28 ENCOUNTER — Telehealth: Payer: Self-pay | Admitting: Internal Medicine

## 2020-06-28 ENCOUNTER — Encounter: Payer: Self-pay | Admitting: Registered Nurse

## 2020-06-28 DIAGNOSIS — E278 Other specified disorders of adrenal gland: Secondary | ICD-10-CM

## 2020-06-28 NOTE — Progress Notes (Signed)
Acute Office Visit  Subjective:    [name], adult    DOB: 05-Feb-1956, 64 y.o.   MRN: [id number]  Chief Complaint  Patient presents with  . Back Pain    PAtient states she is having some servere back Pain says it feels like a pinched nerve. Per patient she has went to the urgent care and was prescribed muscle relaxers but it was only 3 and seemed to help for only a short period of time. She is kept up at night due to her back pain    HPI Patient is in today for back pain  Severe - keeps her from sleep Lower back pain, some radiation to legs Has not happened before Was seen in urgent care, given 3 doses of a muscle relaxer - temporary relief but does not feel it was enough Still in near constant pain No acute injury No hx of back injury No saddle anesthesia No headaches No numbness/weakness in extremities.  Past Medical History:  Diagnosis Date  . Anemia 04/30/2016  . Anxiety   . Blood transfusion without reported diagnosis   . Cardiomyopathy ([place])    a. EF 40-45% by echo in 04/2016 b. Improved to 60-65% by repeat imaging in 2018  . CHF (congestive heart failure) ([place])   . COPD (chronic obstructive pulmonary disease) ([place])   . Coronary artery calcification seen on CT scan   . Essential hypertension   . GERD (gastroesophageal reflux disease)   . History of bronchitis   . History of GI bleed   . Hyperlipidemia   . Iron deficiency anemia 05/12/2016   Bleeding ulcers  . Leukocytosis 03/23/2020  . Pollen allergy   . PSVT (paroxysmal supraventricular tachycardia) ([place])   . PVC's (premature ventricular contractions)   . Thrombocytosis ([place]) 03/23/2020  . Vitamin B12 deficiency 03/23/2020    Past Surgical History:  Procedure Laterality Date  . ABDOMINAL HYSTERECTOMY     polyps  about age 64  . Barbie Banner OSTEOTOMY Right 07/10/2013   Procedure: Barbie Banner OSTEOTOMY RIGHT FOOT;  Surgeon: Marcheta Grammes, DPM;  Location: [facility];  Service: Orthopedics;   Laterality: Right;  . AMPUTATION Left 05/09/2017   Procedure: AMPUTATION 5TH TOE LEFT FOOT;  Surgeon: [name], DPM;  Location: [facility];  Service: Podiatry;  Laterality: Left;  . AMPUTATION Left 06/13/2017   Procedure: AMPUTATION 2ND TOE LEFT FOOT;  Surgeon: [name], DPM;  Location: [facility];  Service: Podiatry;  Laterality: Left;  . BUNIONECTOMY Right 07/10/2013   Procedure: VOGLER BUNIONECTOMY RIGHT FOOT;  Surgeon: Marcheta Grammes, DPM;  Location: [facility];  Service: Orthopedics;  Laterality: Right;  . CHOLECYSTECTOMY N/A 08/22/2017   Procedure: LAPAROSCOPIC CHOLECYSTECTOMY;  Surgeon: Virl Cagey, MD;  Location: [facility];  Service: General;  Laterality: N/A;  . COLONOSCOPY N/A 07/07/2016   Dr. Oneida Alar; redundant left colon, diverticulosis at hepatic flexure, non-bleeding internal hemorrhoids  . ESOPHAGOGASTRODUODENOSCOPY N/A 07/07/2016   Dr. Oneida Alar: many non-bleeding cratered gastric ulcers without stigmata of bleeding in gastric antrum. four 2-3 mm angioectasias without bleeding in duodenal bulb and second portion of duodenum s/p APC. Chroni gastritis on path.   . ESOPHAGOGASTRODUODENOSCOPY N/A 08/03/2017   Dr. Oneida Alar: erosive gastritis, AVMs. Found a single non-bleeding angioectasia in stomach, s/p APC therapy. Four non-bleeding angioectasias in duodenum s/p APC. Non-bleeding erosive gastropathy  . IR ANGIOGRAM EXTREMITY LEFT  04/24/2017  . IR FEM POP ART ATHERECT INC PTA MOD SED  04/24/2017  . IR INFUSION THROMBOL ARTERIAL  INITIAL (MS)  04/24/2017  . IR RADIOLOGIST EVAL & MGMT  12/05/2016  . IR US GUIDE VASC ACCESS RIGHT  04/24/2017  . LIVER BIOPSY N/A 08/22/2017   Procedure: LIVER BIOPSY;  Surgeon: Virl Cagey, MD;  Location: [facility];  Service: General;  Laterality: N/A;  . METATARSAL HEAD EXCISION Right 07/10/2013   Procedure: METATARSAL HEAD RESECTION OF DIGITS 2 AND 3 RIGHT FOOT;  Surgeon: Marcheta Grammes, DPM;  Location: [facility];  Service: Orthopedics;   Laterality: Right;  . PROXIMAL INTERPHALANGEAL FUSION (PIP) Right 07/10/2013   Procedure: ARTHRODESIS PIPJ  2ND DIGIT RIGHT FOOT;  Surgeon: Marcheta Grammes, DPM;  Location: [facility];  Service: Orthopedics;  Laterality: Right;    Family History  Adopted: Yes  Problem Relation Age of Onset  . Hypertension Father   . Coronary artery disease Sister   . Ulcers Maternal Grandmother   . Colon cancer Neg Hx        unknown, was adopted    Social History   Socioeconomic History  . Marital status: Married    Spouse name: [name]  . Number of children: 1  . Years of education: 32  . Highest education level: Not on file  Occupational History  . Occupation: disabled    Comment: heart  Tobacco Use  . Smoking status: Light Tobacco Smoker    Packs/day: 0.25    Years: 44.00    Pack years: 11.00    Types: Cigarettes    Start date: 05/10/1975  . Smokeless tobacco: Never Used  . Tobacco comment: 2 cigarettes per day 12/10/2019  Vaping Use  . Vaping Use: Former  Substance and Sexual Activity  . Alcohol use: Not Currently    Alcohol/week: 2.0 standard drinks    Types: 2 Glasses of wine per week  . Drug use: No  . Sexual activity: Yes    Birth control/protection: Surgical  Other Topics Concern  . Not on file  Social History Narrative   Disabled   Lives with husband [name]   Two dogs   Social Determinants of Health   Financial Resource Strain:   . Difficulty of Paying Living Expenses: Not on file  Food Insecurity:   . Worried About Charity fundraiser in the Last Year: Not on file  . Ran Out of Food in the Last Year: Not on file  Transportation Needs:   . Lack of Transportation (Medical): Not on file  . Lack of Transportation (Non-Medical): Not on file  Physical Activity:   . Days of Exercise per Week: Not on file  . Minutes of Exercise per Session: Not on file  Stress:   . Feeling of Stress : Not on file  Social Connections:   . Frequency of Communication with Friends and  Family: Not on file  . Frequency of Social Gatherings with Friends and Family: Not on file  . Attends Religious Services: Not on file  . Active Member of Clubs or Organizations: Not on file  . Attends Archivist Meetings: Not on file  . Marital Status: Not on file  Intimate Partner Violence:   . Fear of Current or Ex-Partner: Not on file  . Emotionally Abused: Not on file  . Physically Abused: Not on file  . Sexually Abused: Not on file    Outpatient Medications Prior to Visit  Medication Sig Dispense Refill  . acetaminophen (TYLENOL) 500 MG tablet Take 500 mg by mouth at bedtime as needed for moderate pain.     Marland Kitchen  albuterol (PROVENTIL) (2.5 MG/3ML) 0.083% nebulizer solution Take 3 mLs (2.5 mg total) by nebulization every 4 (four) hours as needed for wheezing or shortness of breath. 75 mL 5  . aspirin 81 MG chewable tablet Chew 81 mg by mouth every morning.     Marland Kitchen atorvastatin (LIPITOR) 20 MG tablet TAKE ONE TABLET (20MG TOTAL) BY MOUTH DAILY 90 tablet 3  . bisoprolol (ZEBETA) 10 MG tablet TAKE ONE (1) TABLET BY MOUTH EVERY DAY 90 tablet 3  . Budeson-Glycopyrrol-Formoterol (BREZTRI AEROSPHERE) 160-9-4.8 MCG/ACT AERO Inhale 2 puffs into the lungs 2 (two) times daily. 11.8 g 0  . cyanocobalamin (,VITAMIN B-12,) 1000 MCG/ML injection Inject 1 mL (1,000 mcg total) into the muscle every 30 (thirty) days. 1 mL 11  . empagliflozin (JARDIANCE) 10 MG TABS tablet Take 10 mg by mouth daily before breakfast. 30 tablet 4  . glucose blood (ONETOUCH ULTRA) test strip Use as instructed 100 each 12  . glucose monitoring kit (FREESTYLE) monitoring kit Use to check blood sugar BID as directed 1 each 0  . Lancets (ONETOUCH DELICA PLUS [id number]) MISC 1 each by Does not apply route 2 (two) times a day. 100 each 3  . losartan (COZAAR) 100 MG tablet TAKE ONE (1) TABLET BY MOUTH EVERY DAY 90 tablet 3  . metFORMIN (GLUCOPHAGE) 1000 MG tablet Take 1 tablet (1,000 mg total) by mouth 2 (two) times daily with  a meal. (Patient taking differently: Take 500 mg by mouth 2 (two) times daily with a meal. Takes 500 mg twice daily.  Total of 1000 mg daily.) 180 tablet 3  . Misc. Devices MISC Please provide supplies (needle, syringe, alcohol swabs) needed for patient to self-administer B-12 injections monthly. 1 each 12  . pantoprazole (PROTONIX) 40 MG tablet TAKE ONE (1) TABLET 30 MINS BEFORE YOUR FIRST MEAL. 90 tablet 3  . ULTICARE TUBERCULIN SAFETY SYR 25G X 1" 1 ML MISC USE TO ADMINISTER INJECTABLE VITAMIN B12. 1 each 11  . Continuous Blood Gluc Receiver (FREESTYLE LIBRE 14 DAY READER) DEVI See admin instructions. 6 each 1  . Continuous Blood Gluc Sensor (FREESTYLE LIBRE 14 DAY SENSOR) MISC USE AS DIRECTED 6 each 0  . cyclobenzaprine (FLEXERIL) 5 MG tablet Take 1 tablet (5 mg total) by mouth 3 (three) times daily as needed for muscle spasms. (Patient not taking: Reported on 06/04/2020) 30 tablet 0  . gabapentin (NEURONTIN) 300 MG capsule Take 1 capsule (300 mg total) by mouth 2 (two) times daily. 180 capsule 0   No facility-administered medications prior to visit.    Allergies  Allergen Reactions  . Bee Venom Swelling  . Codeine Anaphylaxis    Tongue swelled  . Penicillins Swelling and Rash    Has patient had a PCN reaction causing immediate rash, facial/tongue/throat swelling, SOB or lightheadedness with hypotension: No, delayed Has patient had a PCN reaction causing severe rash involving mucus membranes or skin necrosis: No Has patient had a PCN reaction that required hospitalization No Has patient had a PCN reaction occurring within the last 10 years: No If all of the above answers are "NO", then may proceed with Cephalosporin use.   . Bupropion Other (See Comments)    "Made my skin crawl"  . Sudafed [Pseudoephedrine Hcl] Rash    Review of Systems Per hpi      Objective:    Physical Exam Vitals and nursing note reviewed.  Constitutional:      General: She is not in acute distress.     Appearance:  Normal appearance. She is not ill-appearing, toxic-appearing or diaphoretic.  Cardiovascular:     Rate and Rhythm: Normal rate and regular rhythm.     Heart sounds: Normal heart sounds.  Pulmonary:     Effort: Pulmonary effort is normal. No respiratory distress.  Musculoskeletal:        General: Tenderness (lumbar paraspinal bilat) present. No swelling or deformity. Normal range of motion.     Right lower leg: No edema.     Left lower leg: No edema.  Skin:    General: Skin is warm and dry.  Neurological:     General: No focal deficit present.     Mental Status: She is alert and oriented to person, place, and time. Mental status is at baseline.     Sensory: No sensory deficit.     Motor: No weakness.  Psychiatric:        Mood and Affect: Mood normal.        Behavior: Behavior normal.        Thought Content: Thought content normal.        Judgment: Judgment normal.     BP 126/88   Pulse 89   Temp (!) 97.4 F (36.3 C) (Temporal)   Resp 18   Ht 5' 4" (1.626 m)   Wt 148 lb 12.8 oz (67.5 kg)   SpO2 95%   BMI 25.54 kg/m  Wt Readings from Last 3 Encounters:  06/18/20 146 lb 1.6 oz (66.3 kg)  06/17/20 146 lb (66.2 kg)  06/04/20 145 lb 6.4 oz (66 kg)    Health Maintenance Due  Topic Date Due  . FOOT EXAM  Never done  . OPHTHALMOLOGY EXAM  Never done  . COVID-19 Vaccine (1) Never done  . PAP SMEAR-Modifier  Never done  . HEMOGLOBIN A1C  06/24/2020    There are no preventive care reminders to display for this patient.   Lab Results  Component Value Date   TSH 1.727 04/30/2016   Lab Results  Component Value Date   WBC 15.3 (H) 06/17/2020   HGB 14.2 06/17/2020   HCT 44.1 06/17/2020   MCV 97.6 06/17/2020   PLT 423 (H) 06/17/2020   Lab Results  Component Value Date   NA 136 05/19/2020   K 4.3 05/19/2020   CO2 22 05/19/2020   GLUCOSE 254 (H) 05/19/2020   BUN 14 05/19/2020   CREATININE 0.66 05/19/2020   BILITOT 0.4 12/09/2019   ALKPHOS 103  12/09/2019   AST 15 12/09/2019   ALT 19 12/09/2019   PROT 7.2 12/09/2019   ALBUMIN 3.8 12/09/2019   CALCIUM 9.4 05/19/2020   ANIONGAP 11 05/19/2020   Lab Results  Component Value Date   CHOL 165 07/02/2019   Lab Results  Component Value Date   HDL 32 (L) 07/02/2019   Lab Results  Component Value Date   LDLCALC 98 07/02/2019   Lab Results  Component Value Date   TRIG 200 (H) 07/02/2019   Lab Results  Component Value Date   CHOLHDL 5.2 (H) 07/02/2019   Lab Results  Component Value Date   HGBA1C 8.7 (A) 12/23/2019       Assessment & Plan:   Problem List Items Addressed This Visit    None    Visit Diagnoses    Pain in joint of right shoulder    -  Primary   Back pain, unspecified back location, unspecified back pain laterality, unspecified chronicity       Relevant Orders  POCT URINALYSIS DIP (CLINITEK) (Completed)   Screening mammogram, encounter for       Relevant Orders   MM Digital Diagnostic Bilat       Meds ordered this encounter  Medications  . DISCONTD: predniSONE (DELTASONE) 10 MG tablet    Sig: Take 1 tablet (10 mg total) by mouth daily with breakfast.    Dispense:  7 tablet    Refill:  0    Order Specific Question:   Supervising Provider    AnswerRutherford Guys [[id number]]   PLAN  POCT UA shows not likely UTI  Due for mammo - will place order  Will give 7 days of prednisone at limited dose of 29m PO qd with regard to t2dm  At home stretches reviewed. PT is next option if needed  Patient encouraged to call clinic with any questions, comments, or concerns.  RMaximiano Coss NP

## 2020-06-28 NOTE — Telephone Encounter (Signed)
Noted, see result note  

## 2020-06-28 NOTE — Telephone Encounter (Signed)
Patient returned call from Friday    Please call back

## 2020-06-29 ENCOUNTER — Telehealth: Payer: Self-pay

## 2020-06-29 NOTE — Telephone Encounter (Signed)
Dr Dorris Fetch I just want to confirm you would like me to schedule this as a new pt. The referral says adrenal hyperplasia. If so, can you check to make sure we have everything you would need for the appt  Thanks

## 2020-06-29 NOTE — Telephone Encounter (Signed)
Yes, we can see her and will order labs as necessary.

## 2020-07-02 ENCOUNTER — Ambulatory Visit (HOSPITAL_COMMUNITY): Payer: PPO

## 2020-07-06 ENCOUNTER — Other Ambulatory Visit: Payer: Self-pay

## 2020-07-06 ENCOUNTER — Telehealth: Payer: Self-pay | Admitting: Internal Medicine

## 2020-07-06 DIAGNOSIS — Z7901 Long term (current) use of anticoagulants: Secondary | ICD-10-CM

## 2020-07-06 DIAGNOSIS — R197 Diarrhea, unspecified: Secondary | ICD-10-CM

## 2020-07-06 DIAGNOSIS — Z79899 Other long term (current) drug therapy: Secondary | ICD-10-CM

## 2020-07-06 NOTE — Telephone Encounter (Signed)
See result note.  

## 2020-07-06 NOTE — Telephone Encounter (Signed)
Patient returned call, please call back  

## 2020-07-07 ENCOUNTER — Other Ambulatory Visit (HOSPITAL_COMMUNITY)
Admission: RE | Admit: 2020-07-07 | Discharge: 2020-07-07 | Disposition: A | Discharge status: Home / Self Care | Payer: PPO | Source: Ambulatory Visit | Attending: Gastroenterology | Admitting: Gastroenterology

## 2020-07-07 ENCOUNTER — Other Ambulatory Visit: Payer: Self-pay

## 2020-07-07 ENCOUNTER — Encounter (HOSPITAL_COMMUNITY): Payer: Self-pay

## 2020-07-07 ENCOUNTER — Inpatient Hospital Stay (HOSPITAL_COMMUNITY): Payer: PPO | Attending: Hematology

## 2020-07-07 VITALS — BP 155/68 | HR 72 | Temp 97.2°F | Resp 18

## 2020-07-07 DIAGNOSIS — R197 Diarrhea, unspecified: Secondary | ICD-10-CM | POA: Diagnosis not present

## 2020-07-07 DIAGNOSIS — Z79899 Other long term (current) drug therapy: Secondary | ICD-10-CM | POA: Diagnosis not present

## 2020-07-07 DIAGNOSIS — D500 Iron deficiency anemia secondary to blood loss (chronic): Secondary | ICD-10-CM | POA: Insufficient documentation

## 2020-07-07 DIAGNOSIS — K922 Gastrointestinal hemorrhage, unspecified: Secondary | ICD-10-CM | POA: Insufficient documentation

## 2020-07-07 DIAGNOSIS — D72829 Elevated white blood cell count, unspecified: Secondary | ICD-10-CM | POA: Diagnosis not present

## 2020-07-07 DIAGNOSIS — D75839 Thrombocytosis, unspecified: Secondary | ICD-10-CM | POA: Diagnosis not present

## 2020-07-07 DIAGNOSIS — E538 Deficiency of other specified B group vitamins: Secondary | ICD-10-CM | POA: Insufficient documentation

## 2020-07-07 DIAGNOSIS — Z23 Encounter for immunization: Secondary | ICD-10-CM | POA: Diagnosis not present

## 2020-07-07 DIAGNOSIS — D508 Other iron deficiency anemias: Secondary | ICD-10-CM

## 2020-07-07 MED ORDER — SODIUM CHLORIDE 0.9 % IV SOLN
510.0000 mg | Freq: Once | INTRAVENOUS | Status: AC
  Administered 2020-07-07: 510 mg via INTRAVENOUS
  Filled 2020-07-07: qty 510

## 2020-07-07 MED ORDER — INFLUENZA VAC SPLIT QUAD 0.5 ML IM SUSY
0.5000 mL | PREFILLED_SYRINGE | Freq: Once | INTRAMUSCULAR | Status: AC
  Administered 2020-07-07: 0.5 mL via INTRAMUSCULAR
  Filled 2020-07-07: qty 0.5

## 2020-07-07 MED ORDER — SODIUM CHLORIDE 0.9 % IV SOLN: Freq: Once | INTRAVENOUS | Status: AC

## 2020-07-07 NOTE — Progress Notes (Signed)
Feraheme given today per MD orders. Tolerated infusion without adverse affects. Vital signs stable. No complaints at this time. Discharged from clinic ambulatory in stable condition. Alert and oriented x 3. F/U with [facility] as scheduled.  

## 2020-07-07 NOTE — Patient Instructions (Signed)
[facility]  Discharge Instructions:   _______________________________________________________________  Thank you for choosing [facility] at [facility] to provide your oncology and hematology care.  To afford each patient quality time with our providers, please arrive at least 15 minutes before your scheduled appointment.  You need to re-schedule your appointment if you arrive 10 or more minutes late.  We strive to give you quality time with our providers, and arriving late affects you and other patients whose appointments are after yours.  Also, if you no show three or more times for appointments you may be dismissed from the clinic.  Again, thank you for choosing [facility] at [facility]. Our hope is that these requests will allow you access to exceptional care and in a timely manner. _______________________________________________________________  If you have questions after your visit, please contact our office at [phone] between the hours of 8:30 a.m. and 5:00 p.m. Voicemails left after 4:30 p.m. will not be returned until the following business day. _______________________________________________________________  For prescription refill requests, have your pharmacy contact our office. _______________________________________________________________  Recommendations made by the consultant and any test results will be sent to your referring physician. _______________________________________________________________ 

## 2020-07-08 ENCOUNTER — Telehealth: Payer: Self-pay | Admitting: Internal Medicine

## 2020-07-08 LAB — IGA: IgA: 548 mg/dL — ABNORMAL HIGH (ref 87–352)

## 2020-07-08 NOTE — Telephone Encounter (Signed)
Pt calling for her lab results. [phone]

## 2020-07-08 NOTE — Telephone Encounter (Signed)
Pt is inquiring about labs.

## 2020-07-08 NOTE — Telephone Encounter (Signed)
Waiting on the TTG to come back. Labs were drawn yesterday.

## 2020-07-09 LAB — TISSUE TRANSGLUTAMINASE, IGG: Tissue Transglut Ab: <2 U/mL (ref 0–5)

## 2020-07-09 NOTE — Telephone Encounter (Signed)
Noted, documentation will be in result note.

## 2020-07-12 ENCOUNTER — Other Ambulatory Visit: Payer: Self-pay | Admitting: *Deleted

## 2020-07-12 DIAGNOSIS — Z79899 Other long term (current) drug therapy: Secondary | ICD-10-CM

## 2020-07-12 DIAGNOSIS — D649 Anemia, unspecified: Secondary | ICD-10-CM

## 2020-07-12 DIAGNOSIS — R197 Diarrhea, unspecified: Secondary | ICD-10-CM

## 2020-07-12 DIAGNOSIS — K279 Peptic ulcer, site unspecified, unspecified as acute or chronic, without hemorrhage or perforation: Secondary | ICD-10-CM

## 2020-07-12 DIAGNOSIS — D500 Iron deficiency anemia secondary to blood loss (chronic): Secondary | ICD-10-CM

## 2020-07-12 DIAGNOSIS — R1010 Upper abdominal pain, unspecified: Secondary | ICD-10-CM

## 2020-07-15 ENCOUNTER — Telehealth: Payer: Self-pay | Admitting: Internal Medicine

## 2020-07-15 NOTE — Telephone Encounter (Signed)
Pt called to say that she had seen Neil Crouch, PA and we were going to refer her to a specialist. She was calling to follow up. [phone]

## 2020-07-15 NOTE — Telephone Encounter (Signed)
Pt was referred to Dr. Dorris Fetch. pt advised.

## 2020-08-01 ENCOUNTER — Encounter: Payer: Self-pay | Admitting: Registered Nurse

## 2020-08-02 ENCOUNTER — Other Ambulatory Visit: Payer: Self-pay | Admitting: Registered Nurse

## 2020-08-02 DIAGNOSIS — E1140 Type 2 diabetes mellitus with diabetic neuropathy, unspecified: Secondary | ICD-10-CM

## 2020-08-02 NOTE — Telephone Encounter (Signed)
Rx has been filled however pt will need a f/u appointment.   Please assist in getting it scheduled.

## 2020-08-03 ENCOUNTER — Ambulatory Visit: Payer: PPO | Admitting: "Endocrinology

## 2020-08-05 NOTE — Telephone Encounter (Signed)
08/05/2020 - PATIENT REQUESTED A REFILL ON HER GABAPENTIN 300 mg. I HAVE SCHEDULED HER TO FOLLOW-UP WITH RICH MORROW ON Wednesday (09/01/2020) AT 2:30 pm. MEI HAS GIVEN HER A REFILL SO I DO NOT HAVE TO ROUTE BACK TO THE CLINICAL TEAM AT THIS TIME. [place]

## 2020-08-13 ENCOUNTER — Encounter: Payer: Self-pay | Admitting: Registered Nurse

## 2020-08-18 ENCOUNTER — Ambulatory Visit: Payer: PPO | Admitting: "Endocrinology

## 2020-08-18 ENCOUNTER — Other Ambulatory Visit: Payer: Self-pay

## 2020-08-18 ENCOUNTER — Encounter: Payer: Self-pay | Admitting: "Endocrinology

## 2020-08-18 VITALS — BP 148/82 | HR 72 | Ht 64.0 in | Wt 145.2 lb

## 2020-08-18 DIAGNOSIS — E278 Other specified disorders of adrenal gland: Secondary | ICD-10-CM

## 2020-08-18 NOTE — Progress Notes (Signed)
Endocrinology Consult Note                                            08/18/2020, 2:15 PM   Subjective:    [name], adult    DOB: [birth year], PCP [name], NP   Past Medical History:  Diagnosis Date  . Anemia 04/30/2016  . Anxiety   . Blood transfusion without reported diagnosis   . Cardiomyopathy ([place])    a. EF 40-45% by echo in 04/2016 b. Improved to 60-65% by repeat imaging in 2018  . CHF (congestive heart failure) ([place])   . COPD (chronic obstructive pulmonary disease) ([place])   . Coronary artery calcification seen on CT scan   . Essential hypertension   . GERD (gastroesophageal reflux disease)   . History of bronchitis   . History of GI bleed   . Hyperlipidemia   . Iron deficiency anemia 05/12/2016   Bleeding ulcers  . Leukocytosis 03/23/2020  . Pollen allergy   . PSVT (paroxysmal supraventricular tachycardia) ([place])   . PVC's (premature ventricular contractions)   . Thrombocytosis 03/23/2020  . Vitamin B12 deficiency 03/23/2020   Past Surgical History:  Procedure Laterality Date  . ABDOMINAL HYSTERECTOMY     polyps  about age 16  . Barbie Banner OSTEOTOMY Right 07/10/2013   Procedure: Barbie Banner OSTEOTOMY RIGHT FOOT;  Surgeon: Marcheta Grammes, DPM;  Location: [facility];  Service: Orthopedics;  Laterality: Right;  . AMPUTATION Left 05/09/2017   Procedure: AMPUTATION 5TH TOE LEFT FOOT;  Surgeon: [name], DPM;  Location: [facility];  Service: Podiatry;  Laterality: Left;  . AMPUTATION Left 06/13/2017   Procedure: AMPUTATION 2ND TOE LEFT FOOT;  Surgeon: [name], DPM;  Location: [facility];  Service: Podiatry;  Laterality: Left;  . BUNIONECTOMY Right 07/10/2013   Procedure: VOGLER BUNIONECTOMY RIGHT FOOT;  Surgeon: Marcheta Grammes, DPM;  Location: [facility];  Service: Orthopedics;  Laterality: Right;  . CHOLECYSTECTOMY N/A 08/22/2017   Procedure: LAPAROSCOPIC CHOLECYSTECTOMY;  Surgeon: Virl Cagey, MD;  Location: [facility];  Service:  General;  Laterality: N/A;  . COLONOSCOPY N/A 07/07/2016   Dr. Oneida Alar; redundant left colon, diverticulosis at hepatic flexure, non-bleeding internal hemorrhoids  . ESOPHAGOGASTRODUODENOSCOPY N/A 07/07/2016   Dr. Oneida Alar: many non-bleeding cratered gastric ulcers without stigmata of bleeding in gastric antrum. four 2-3 mm angioectasias without bleeding in duodenal bulb and second portion of duodenum s/p APC. Chroni gastritis on path.   . ESOPHAGOGASTRODUODENOSCOPY N/A 08/03/2017   Dr. Oneida Alar: erosive gastritis, AVMs. Found a single non-bleeding angioectasia in stomach, s/p APC therapy. Four non-bleeding angioectasias in duodenum s/p APC. Non-bleeding erosive gastropathy  . IR ANGIOGRAM EXTREMITY LEFT  04/24/2017  . IR FEM POP ART ATHERECT INC PTA MOD SED  04/24/2017  . IR INFUSION THROMBOL ARTERIAL INITIAL (MS)  04/24/2017  . IR RADIOLOGIST EVAL & MGMT  12/05/2016  . IR US GUIDE VASC ACCESS RIGHT  04/24/2017  . LIVER BIOPSY N/A 08/22/2017   Procedure: LIVER BIOPSY;  Surgeon: Virl Cagey, MD;  Location: [facility];  Service: General;  Laterality: N/A;  . METATARSAL HEAD EXCISION Right 07/10/2013   Procedure: METATARSAL HEAD RESECTION OF DIGITS 2 AND 3 RIGHT FOOT;  Surgeon: Marcheta Grammes, DPM;  Location: [facility];  Service: Orthopedics;  Laterality: Right;  . PROXIMAL INTERPHALANGEAL FUSION (PIP) Right 07/10/2013  Procedure: ARTHRODESIS PIPJ  2ND DIGIT RIGHT FOOT;  Surgeon: Marcheta Grammes, DPM;  Location: [facility];  Service: Orthopedics;  Laterality: Right;   Social History   Socioeconomic History  . Marital status: Married    Spouse name: [name]  . Number of children: 1  . Years of education: 17  . Highest education level: Not on file  Occupational History  . Occupation: disabled    Comment: heart  Tobacco Use  . Smoking status: Light Tobacco Smoker    Packs/day: 0.25    Years: 44.00    Pack years: 11.00    Types: Cigarettes    Start date: 05/10/1975  . Smokeless tobacco: Never  Used  . Tobacco comment: 2 cigarettes per day 12/10/2019  Vaping Use  . Vaping Use: Former  Substance and Sexual Activity  . Alcohol use: Not Currently    Alcohol/week: 2.0 standard drinks    Types: 2 Glasses of wine per week  . Drug use: No  . Sexual activity: Yes    Birth control/protection: Surgical  Other Topics Concern  . Not on file  Social History Narrative   Disabled   Lives with husband [name]   Two dogs   Social Determinants of Health   Financial Resource Strain:   . Difficulty of Paying Living Expenses: Not on file  Food Insecurity:   . Worried About Charity fundraiser in the Last Year: Not on file  . Ran Out of Food in the Last Year: Not on file  Transportation Needs:   . Lack of Transportation (Medical): Not on file  . Lack of Transportation (Non-Medical): Not on file  Physical Activity:   . Days of Exercise per Week: Not on file  . Minutes of Exercise per Session: Not on file  Stress:   . Feeling of Stress : Not on file  Social Connections:   . Frequency of Communication with Friends and Family: Not on file  . Frequency of Social Gatherings with Friends and Family: Not on file  . Attends Religious Services: Not on file  . Active Member of Clubs or Organizations: Not on file  . Attends Archivist Meetings: Not on file  . Marital Status: Not on file   Family History  Adopted: Yes  Problem Relation Age of Onset  . Hypertension Father   . Coronary artery disease Sister   . Ulcers Maternal Grandmother   . Colon cancer Neg Hx        unknown, was adopted   Outpatient Encounter Medications as of 08/18/2020  Medication Sig  . acetaminophen (TYLENOL) 500 MG tablet Take 500 mg by mouth at bedtime as needed for moderate pain.   Marland Kitchen albuterol (PROVENTIL) (2.5 MG/3ML) 0.083% nebulizer solution Take 3 mLs (2.5 mg total) by nebulization every 4 (four) hours as needed for wheezing or shortness of breath.  Marland Kitchen aspirin 81 MG chewable tablet Chew 81 mg by mouth  every morning.   Marland Kitchen atorvastatin (LIPITOR) 20 MG tablet TAKE ONE TABLET ($RemoveBef'20MG'XMVznPldXM$  TOTAL) BY MOUTH DAILY  . bisoprolol (ZEBETA) 10 MG tablet TAKE ONE (1) TABLET BY MOUTH EVERY DAY  . Budeson-Glycopyrrol-Formoterol (BREZTRI AEROSPHERE) 160-9-4.8 MCG/ACT AERO Inhale 2 puffs into the lungs 2 (two) times daily.  . Cholecalciferol (VITAMIN D) 50 MCG (2000 UT) CAPS Take by mouth daily.  . Continuous Blood Gluc Receiver (FREESTYLE LIBRE 14 DAY READER) DEVI See admin instructions.  . Continuous Blood Gluc Sensor (FREESTYLE LIBRE 14 DAY SENSOR) MISC USE AS DIRECTED  .  cyanocobalamin (,VITAMIN B-12,) 1000 MCG/ML injection Inject 1 mL (1,000 mcg total) into the muscle every 30 (thirty) days.  Marland Kitchen dicyclomine (BENTYL) 10 MG capsule Take 1 capsule (10 mg total) by mouth as needed for spasms (30 mins before meals and at bedtime as needed for diarrhea and abdominal cramps.). Take no more than four times daily but take only as needed  . dicyclomine (BENTYL) 10 MG capsule Take 10 mg by mouth 4 (four) times daily -  before meals and at bedtime.  . empagliflozin (JARDIANCE) 10 MG TABS tablet Take 10 mg by mouth daily before breakfast.  . gabapentin (NEURONTIN) 300 MG capsule TAKE ONE CAPSULE (300 MG TOTAL) BY MOUTHTWO TIMES DAILY.  Marland Kitchen glucose blood (ONETOUCH ULTRA) test strip Use as instructed  . glucose monitoring kit (FREESTYLE) monitoring kit Use to check blood sugar BID as directed  . lactase (LACTAID) 3000 units tablet Take by mouth as needed.  . Lancets (ONETOUCH DELICA PLUS [id number]) MISC 1 each by Does not apply route 2 (two) times a day.  . losartan (COZAAR) 100 MG tablet TAKE ONE (1) TABLET BY MOUTH EVERY DAY  . metFORMIN (GLUCOPHAGE) 1000 MG tablet Take 1 tablet (1,000 mg total) by mouth 2 (two) times daily with a meal. (Patient taking differently: Take 500 mg by mouth 2 (two) times daily with a meal. Takes 500 mg twice daily.  Total of 1000 mg daily.)  . Misc. Devices MISC Please provide supplies (needle,  syringe, alcohol swabs) needed for patient to self-administer B-12 injections monthly.  . pantoprazole (PROTONIX) 40 MG tablet TAKE ONE (1) TABLET 30 MINS BEFORE YOUR FIRST MEAL.  Marland Kitchen potassium chloride (KLOR-CON) 10 MEQ tablet Take 1 tablet (10 mEq total) by mouth daily.  Marland Kitchen SHINGRIX injection   . ULTICARE TUBERCULIN SAFETY SYR 25G X 1" 1 ML MISC USE TO ADMINISTER INJECTABLE VITAMIN B12.   No facility-administered encounter medications on file as of 08/18/2020.   ALLERGIES: Allergies  Allergen Reactions  . Bee Venom Swelling  . Codeine Anaphylaxis    Tongue swelled  . Penicillins Swelling and Rash    Has patient had a PCN reaction causing immediate rash, facial/tongue/throat swelling, SOB or lightheadedness with hypotension: No, delayed Has patient had a PCN reaction causing severe rash involving mucus membranes or skin necrosis: No Has patient had a PCN reaction that required hospitalization No Has patient had a PCN reaction occurring within the last 10 years: No If all of the above answers are "NO", then may proceed with Cephalosporin use.   . Bupropion Other (See Comments)    "Made my skin crawl"  . Sudafed [Pseudoephedrine Hcl] Rash    VACCINATION STATUS: Immunization History  Administered Date(s) Administered  . Influenza,inj,Quad PF,6+ Mos 07/04/2017, 07/10/2018, 07/02/2019, 07/07/2020  . Pneumococcal Polysaccharide-23 11/01/2018    HPI KIYLA RINGLER is 64 y.o. adult who presents today with a medical history as above. she is being seen in consultation for adrenal hyperplasia requested by [name], NP.  History is obtained directly from the patient and chart review.  She denies any prior history of adrenal, thyroid, pituitary, parathyroid dysfunction.  She has type 2 diabetes on treatment with  Jardiance, and Metformin by her PMD.  Her most recent A1c was 8.7% in March 2021.  On June 17, 2020 she underwent abdominal/pelvic CT scan with contrast for work-up of  diarrhea and epigastric pain.  Incidentally she was found to have vaguely described thickening of the adrenal glands most consistent with hyperplasia, no enhancing  lesions ,  no discrete adenoma/mass lesion.  No further functional studies were performed.  Of note, in 2017 abdominal imaging did not mention the adrenal hyperplasia.  She is a chronic heavy smoker with COPD on bronchodilators.  She denies any family history of adrenal, thyroid, pituitary dysfunction. She denies any history of difficult to control hypertension, diaphoresis, nor paroxysmal headaches. She reports fluctuating body weight, currently weighs 145 pounds with BMI of 24.9.  Review of Systems  Constitutional: + Minimally fluctuating body weight, + fatigue, no subjective hyperthermia, no subjective hypothermia Eyes: no blurry vision, no xerophthalmia ENT: no sore throat, no nodules palpated in throat, no dysphagia/odynophagia, no hoarseness Cardiovascular: no Chest Pain, no Shortness of Breath, no palpitations, no leg swelling Respiratory: + cough, no shortness of breath Gastrointestinal: no Nausea/Vomiting/Diarhhea Musculoskeletal: no muscle/joint aches Skin: no rashes Neurological: no tremors, no numbness, no tingling, no dizziness Psychiatric: no depression, no anxiety  Objective:    Vitals with BMI 08/18/2020 07/07/2020 07/07/2020  Height _0  - -  Weight 145 lbs 3 oz - -  BMI 79.89 - -  Systolic 211 941 740  Diastolic 82 68 82  Pulse 72 72 76    BP (!) 148/82   Pulse 72   Ht _1  (1.626 m)   Wt 145 lb 3.2 oz (65.9 kg)   BMI 24.92 kg/m   Wt Readings from Last 3 Encounters:  08/18/20 145 lb 3.2 oz (65.9 kg)  06/18/20 146 lb 1.6 oz (66.3 kg)  06/17/20 146 lb (66.2 kg)    Physical Exam  Constitutional:  Body mass index is 24.92 kg/m.,  not in acute distress, normal state of mind Eyes: PERRLA, EOMI, no exophthalmos ENT: moist mucous membranes, no gross thyromegaly, no gross cervical  lymphadenopathy Cardiovascular: normal precordial activity, Regular Rate and Rhythm, no Murmur/Rubs/Gallops Respiratory:  + Tight chest with poor air entry, no gross chest deformity, Clear to auscultation bilaterally Gastrointestinal: abdomen soft, Non -tender, No distension, Bowel Sounds present, no gross organomegaly Musculoskeletal: no gross deformities, strength intact in all four extremities Skin: moist, warm, no rashes Neurological: no tremor with outstretched hands, Deep tendon reflexes normal in bilateral lower extremities.  CMP ( most recent) CMP     Component Value Date/Time   NA 136 05/19/2020 1415   NA 139 10/13/2019 1431   K 4.3 05/19/2020 1415   CL 103 05/19/2020 1415   CO2 22 05/19/2020 1415   GLUCOSE 254 (H) 05/19/2020 1415   BUN 14 05/19/2020 1415   BUN 8 10/13/2019 1431   CREATININE 0.66 05/19/2020 1415   CREATININE 0.73 09/28/2017 1201   CALCIUM 9.4 05/19/2020 1415   PROT 7.2 12/09/2019 1107   PROT 7.0 10/13/2019 1431   ALBUMIN 3.8 12/09/2019 1107   ALBUMIN 4.1 10/13/2019 1431   AST 15 12/09/2019 1107   ALT 19 12/09/2019 1107   ALKPHOS 103 12/09/2019 1107   BILITOT 0.4 12/09/2019 1107   BILITOT 0.3 10/13/2019 1431   GFRNONAA >60 05/19/2020 1415   GFRNONAA 90 09/28/2017 1201   GFRAA >60 05/19/2020 1415   GFRAA 104 09/28/2017 1201     Diabetic Labs (most recent): Lab Results  Component Value Date   HGBA1C 8.7 (A) 12/23/2019   HGBA1C 7.0 (H) 10/13/2019   HGBA1C 8.7 (H) 08/19/2019     Lipid Panel ( most recent) Lipid Panel     Component Value Date/Time   CHOL 165 07/02/2019 1539   TRIG 200 (H) 07/02/2019 1539   HDL 32 (L) 07/02/2019 1539  CHOLHDL 5.2 (H) 07/02/2019 1539   CHOLHDL 4.4 09/28/2017 1201   VLDL NOT CALC 03/30/2017 1520   LDLCALC 98 07/02/2019 1539   LDLCALC 88 09/28/2017 1201   LABVLDL 35 07/02/2019 1539      Lab Results  Component Value Date   TSH 1.727 04/30/2016        CT abdomen/pelvis June 17, 2020 -------- Adrenals/urinary tract: Thickening of the adrenal glands most consistent with hyperplasia. No enhancing renal lesion. No renal obstruction. Ureters and bladder normal. ------------  IMPRESSION: 1. No acute findings in the abdomen pelvis. 2. No diverticulosis.  No bowel inflammation. 3. Postcholecystectomy and hysterectomy. 4. Aortic Atherosclerosis (ICD10-I70.0).   Assessment & Plan:   1. Adrenal hyperplasia ([place])  - SAKARA LEHTINEN  is being seen at a kind request of [name], NP. - I have reviewed her available endocrine records and clinically evaluated the patient. - Based on these reviews, she has incidental finding of thickening of bilateral adrenals with no discrete mass lesions or enhancing lesions.  -Patient will be offered functional studies including 24-hour urine measurements of catecholamines, metanephrines, and cortisol. -If she has functional abnormalities, will be addressed accordingly.  If she is found to have nonfunctioning adrenal hyperplasia, she will be put on observation status.  -Her next CT scan will be adrenal protocol, 65-monthfrom the last one ( in March 2022) to characterize the adrenal findings better.  She is extensively counseled against smoking. The patient was counseled on the dangers of tobacco use, and was advised to quit.  Reviewed strategies to maximize success, including removing cigarettes and smoking materials from environment.   She wishes to follow-up with her PMD about diabetes management.  She is currently on Jardiance 10 mg p.o. daily and Metformin 1000 mg p.o. twice daily.  She did not have recent A1c, already has a follow-up appointment with her PMD.  -She will return in 2 weeks with her lab studies. - I did not initiate any new prescriptions today. - she is advised to maintain close follow up with MMaximiano Coss NP for primary care needs.   - Time spent with the patient: 45 minutes, of which >50% was spent in  discussing her adrenal findings, smoking and the rest in obtaining information about her symptoms, reviewing her previous labs/studies ( including abstractions from other facilities),  evaluations, and treatments,  and developing a plan to confirm diagnosis and long term treatment based on the latest standards of care/guidelines; and documenting her care.  DSuszanne Finchparticipated in the discussions, expressed understanding, and voiced agreement with the above plans.  All questions were answered to her satisfaction. she is encouraged to contact clinic should she have any questions or concerns prior to her return visit.  Follow up plan: Return in about 2 weeks (around 09/01/2020) for 24 Hr Urine Free Cortisol & Cr, F/U with Pre-visit Labs.   GGlade Lloyd MD [facility]Group RReconstruct[facility] Surgery [facility] Of [facility] Inc[address] StreetRHuntington Urbank [address]Phone: [phone] Fax: [phone]    08/18/2020, 2:15 PM  This note was partially dictated with voice recognition software. Similar sounding words can be transcribed inadequately or may not  be corrected upon review.

## 2020-08-19 ENCOUNTER — Encounter: Payer: Self-pay | Admitting: "Endocrinology

## 2020-08-25 DIAGNOSIS — E278 Other specified disorders of adrenal gland: Secondary | ICD-10-CM | POA: Diagnosis not present

## 2020-08-26 LAB — CREATININE, URINE, 24 HOUR
Creatinine, 24H Ur: 915 mg/24 hr (ref 800–1800)
Creatinine, Urine: 30.5 mg/dL

## 2020-08-29 LAB — CORTISOL, URINE, FREE
Cortisol (Ur), Free: 40 ug/24 hr (ref 6–42)
Cortisol,F,ug/L,U: 13 ug/L

## 2020-08-31 ENCOUNTER — Other Ambulatory Visit: Payer: Self-pay

## 2020-08-31 ENCOUNTER — Telehealth: Payer: Self-pay

## 2020-08-31 DIAGNOSIS — E278 Other specified disorders of adrenal gland: Secondary | ICD-10-CM

## 2020-08-31 NOTE — Telephone Encounter (Signed)
If patient calls back, she needs to go back to the lab and get another jug for her to collect her urine. It looks like the lab only did 2 out of the 4 that were ordered. We probably will have to move her appt on 12/7 as well.

## 2020-09-01 ENCOUNTER — Ambulatory Visit (INDEPENDENT_AMBULATORY_CARE_PROVIDER_SITE_OTHER): Payer: PPO | Admitting: Registered Nurse

## 2020-09-01 ENCOUNTER — Encounter: Payer: Self-pay | Admitting: Registered Nurse

## 2020-09-01 ENCOUNTER — Other Ambulatory Visit: Payer: Self-pay

## 2020-09-01 VITALS — BP 180/84 | HR 74 | Temp 97.8°F | Ht 65.0 in | Wt 145.2 lb

## 2020-09-01 DIAGNOSIS — E1140 Type 2 diabetes mellitus with diabetic neuropathy, unspecified: Secondary | ICD-10-CM

## 2020-09-01 DIAGNOSIS — L299 Pruritus, unspecified: Secondary | ICD-10-CM | POA: Diagnosis not present

## 2020-09-01 DIAGNOSIS — E1165 Type 2 diabetes mellitus with hyperglycemia: Secondary | ICD-10-CM

## 2020-09-01 LAB — CATECHOLAMINES, FRACTIONATED, URINE, 24 HOUR
Dopamine , 24H Ur: 626 ug/24 hr — ABNORMAL HIGH (ref 0–510)
Dopamine, Rand Ur: 202 ug/L
Epinephrine, 24H Ur: 9 ug/24 hr (ref 0–20)
Epinephrine, Rand Ur: 3 ug/L
Norepinephrine, 24H Ur: 90 ug/24 hr (ref 0–135)
Norepinephrine, Rand Ur: 29 ug/L

## 2020-09-01 LAB — POCT GLYCOSYLATED HEMOGLOBIN (HGB A1C): Hemoglobin A1C: 8.6 % — AB (ref 4.0–5.6)

## 2020-09-01 MED ORDER — TRIAMCINOLONE ACETONIDE 0.1 % EX CREA: 1.0000 "application " | TOPICAL_CREAM | Freq: Two times a day (BID) | CUTANEOUS | 0 refills | Status: DC

## 2020-09-01 MED ORDER — GABAPENTIN 300 MG PO CAPS: ORAL_CAPSULE | ORAL | 1 refills | Status: DC

## 2020-09-01 MED ORDER — FREESTYLE LIBRE 14 DAY SENSOR MISC: 1.0000 | 1 refills | Status: DC

## 2020-09-01 NOTE — Patient Instructions (Signed)
° ° ° °  If you have lab work done today you will be contacted with your lab results within the next 2 weeks.  If you have not heard from us then please contact us. The fastest way to get your results is to register for My Chart. ° ° °IF you received an x-ray today, you will receive an invoice from [facility]. Please contact  Radiology at [phone] with questions or concerns regarding your invoice.  ° °IF you received labwork today, you will receive an invoice from [facility]. Please contact [facility] at [phone] with questions or concerns regarding your invoice.  ° °Our billing staff will not be able to assist you with questions regarding bills from these companies. ° °You will be contacted with the lab results as soon as they are available. The fastest way to get your results is to activate your My Chart account. Instructions are located on the last page of this paperwork. If you have not heard from us regarding the results in 2 weeks, please contact this office. °  ° ° ° °

## 2020-09-02 ENCOUNTER — Encounter: Payer: Self-pay | Admitting: Registered Nurse

## 2020-09-02 ENCOUNTER — Ambulatory Visit: Payer: PPO | Admitting: Internal Medicine

## 2020-09-02 ENCOUNTER — Telehealth: Payer: Self-pay | Admitting: Registered Nurse

## 2020-09-02 ENCOUNTER — Encounter: Payer: Self-pay | Admitting: Internal Medicine

## 2020-09-02 DIAGNOSIS — K279 Peptic ulcer, site unspecified, unspecified as acute or chronic, without hemorrhage or perforation: Secondary | ICD-10-CM

## 2020-09-02 DIAGNOSIS — K31819 Angiodysplasia of stomach and duodenum without bleeding: Secondary | ICD-10-CM

## 2020-09-02 LAB — COMPREHENSIVE METABOLIC PANEL
ALT: 17 IU/L (ref 0–32)
AST: 12 IU/L (ref 0–40)
Albumin/Globulin Ratio: 1.4 (ref 1.2–2.2)
Albumin: 4.2 g/dL (ref 3.8–4.8)
Alkaline Phosphatase: 126 IU/L — ABNORMAL HIGH (ref 44–121)
BUN/Creatinine Ratio: 13 (ref 12–28)
BUN: 7 mg/dL — ABNORMAL LOW (ref 8–27)
Bilirubin Total: 0.3 mg/dL (ref 0.0–1.2)
CO2: 23 mmol/L (ref 20–29)
Calcium: 10 mg/dL (ref 8.7–10.3)
Chloride: 104 mmol/L (ref 96–106)
Creatinine, Ser: 0.55 mg/dL — ABNORMAL LOW (ref 0.57–1.00)
GFR calc Af Amer: 115 mL/min/{1.73_m2} (ref >59)
GFR calc non Af Amer: 100 mL/min/{1.73_m2} (ref >59)
Globulin, Total: 2.9 g/dL (ref 1.5–4.5)
Glucose: 177 mg/dL — ABNORMAL HIGH (ref 65–99)
Potassium: 4.4 mmol/L (ref 3.5–5.2)
Sodium: 141 mmol/L (ref 134–144)
Total Protein: 7.1 g/dL (ref 6.0–8.5)

## 2020-09-02 LAB — CBC
Hematocrit: 45.1 % (ref 34.0–46.6)
Hemoglobin: 15.5 g/dL (ref 11.1–15.9)
MCH: 32.2 pg (ref 26.6–33.0)
MCHC: 34.4 g/dL (ref 31.5–35.7)
MCV: 94 fL (ref 79–97)
Platelets: 370 10*3/uL (ref 150–450)
RBC: 4.81 x10E6/uL (ref 3.77–5.28)
RDW: 12.8 % (ref 11.7–15.4)
WBC: 12.3 10*3/uL — ABNORMAL HIGH (ref 3.4–10.8)

## 2020-09-02 LAB — LIPID PANEL
Chol/HDL Ratio: 6.5 ratio — ABNORMAL HIGH (ref 0.0–4.4)
Cholesterol, Total: 202 mg/dL — ABNORMAL HIGH (ref 100–199)
HDL: 31 mg/dL — ABNORMAL LOW (ref >39)
LDL Chol Calc (NIH): 113 mg/dL — ABNORMAL HIGH (ref 0–99)
Triglycerides: 335 mg/dL — ABNORMAL HIGH (ref 0–149)
VLDL Cholesterol Cal: 58 mg/dL — ABNORMAL HIGH (ref 5–40)

## 2020-09-02 LAB — METANEPHRINES, URINE, 24 HOUR
Metaneph Total, Ur: 85 ug/L
Metanephrines, 24H Ur: 255 ug/24 hr — ABNORMAL HIGH (ref 36–209)
Normetanephrine, 24H Ur: 618 ug/24 hr — ABNORMAL HIGH (ref 131–612)
Normetanephrine, Ur: 206 ug/L

## 2020-09-02 MED ORDER — PANTOPRAZOLE SODIUM 40 MG PO TBEC: DELAYED_RELEASE_TABLET | ORAL | 3 refills | Status: DC

## 2020-09-02 NOTE — Progress Notes (Signed)
Referring Provider: [name], NP Primary Care Physician:  [name], NP Primary GI:  Dr. Abbey Chatters  Chief Complaint  Patient presents with  . Anemia    HPI:   Tammy Mcpherson is a 64 y.o. adult who presents to clinic today for follow-up visit.  She has a history of irritable bowel syndrome diarrhea predominant which is maintained on as needed dicyclomine.  She states this is helping her immensely.  Does not take it every day.  Also has chronic reflux which is well controlled on pantoprazole daily.  She is requesting refill in office today.  History of iron deficiency anemia with gastric and duodenal AVMs status post ablation.  She is followed by hematology and receiving intermittent IV iron infusions.  Currently being worked up by endocrinology for potential adrenal hyperplasia.  Otherwise she has no other complaints.  States she is doing well from a GI standpoint.   Past Medical History:  Diagnosis Date  . Anemia 04/30/2016  . Anxiety   . Blood transfusion without reported diagnosis   . Cardiomyopathy ([place])    a. EF 40-45% by echo in 04/2016 b. Improved to 60-65% by repeat imaging in 2018  . CHF (congestive heart failure) ([place])   . COPD (chronic obstructive pulmonary disease) ([place])   . Coronary artery calcification seen on CT scan   . Essential hypertension   . GERD (gastroesophageal reflux disease)   . History of bronchitis   . History of GI bleed   . Hyperlipidemia   . Iron deficiency anemia 05/12/2016   Bleeding ulcers  . Leukocytosis 03/23/2020  . Pollen allergy   . PSVT (paroxysmal supraventricular tachycardia) ([place])   . PVC's (premature ventricular contractions)   . Thrombocytosis 03/23/2020  . Vitamin B12 deficiency 03/23/2020    Past Surgical History:  Procedure Laterality Date  . ABDOMINAL HYSTERECTOMY     polyps  about age 71  . Barbie Banner OSTEOTOMY Right 07/10/2013   Procedure: Barbie Banner OSTEOTOMY RIGHT FOOT;  Surgeon: Marcheta Grammes, DPM;  Location: [facility];  Service: Orthopedics;  Laterality: Right;  . AMPUTATION Left 05/09/2017   Procedure: AMPUTATION 5TH TOE LEFT FOOT;  Surgeon: [name], DPM;  Location: [facility];  Service: Podiatry;  Laterality: Left;  . AMPUTATION Left 06/13/2017   Procedure: AMPUTATION 2ND TOE LEFT FOOT;  Surgeon: [name], DPM;  Location: [facility];  Service: Podiatry;  Laterality: Left;  . BUNIONECTOMY Right 07/10/2013   Procedure: VOGLER BUNIONECTOMY RIGHT FOOT;  Surgeon: Marcheta Grammes, DPM;  Location: [facility];  Service: Orthopedics;  Laterality: Right;  . CHOLECYSTECTOMY N/A 08/22/2017   Procedure: LAPAROSCOPIC CHOLECYSTECTOMY;  Surgeon: Virl Cagey, MD;  Location: [facility];  Service: General;  Laterality: N/A;  . COLONOSCOPY N/A 07/07/2016   Dr. Oneida Alar; redundant left colon, diverticulosis at hepatic flexure, non-bleeding internal hemorrhoids  . ESOPHAGOGASTRODUODENOSCOPY N/A 07/07/2016   Dr. Oneida Alar: many non-bleeding cratered gastric ulcers without stigmata of bleeding in gastric antrum. four 2-3 mm angioectasias without bleeding in duodenal bulb and second portion of duodenum s/p APC. Chroni gastritis on path.   . ESOPHAGOGASTRODUODENOSCOPY N/A 08/03/2017   Dr. Oneida Alar: erosive gastritis, AVMs. Found a single non-bleeding angioectasia in stomach, s/p APC therapy. Four non-bleeding angioectasias in duodenum s/p APC. Non-bleeding erosive gastropathy  . IR ANGIOGRAM EXTREMITY LEFT  04/24/2017  . IR FEM POP ART ATHERECT INC PTA MOD SED  04/24/2017  . IR INFUSION THROMBOL ARTERIAL INITIAL (MS)  04/24/2017  . IR RADIOLOGIST EVAL & MGMT  12/05/2016  . IR US GUIDE VASC ACCESS RIGHT  04/24/2017  . LIVER BIOPSY N/A 08/22/2017   Procedure: LIVER BIOPSY;  Surgeon: Virl Cagey, MD;  Location: [facility];  Service: General;  Laterality: N/A;  . METATARSAL HEAD EXCISION Right 07/10/2013   Procedure: METATARSAL HEAD RESECTION OF DIGITS 2 AND 3 RIGHT FOOT;  Surgeon: Marcheta Grammes, DPM;  Location: [facility];   Service: Orthopedics;  Laterality: Right;  . PROXIMAL INTERPHALANGEAL FUSION (PIP) Right 07/10/2013   Procedure: ARTHRODESIS PIPJ  2ND DIGIT RIGHT FOOT;  Surgeon: Marcheta Grammes, DPM;  Location: [facility];  Service: Orthopedics;  Laterality: Right;    Current Outpatient Medications  Medication Sig Dispense Refill  . acetaminophen (TYLENOL) 500 MG tablet Take 500 mg by mouth at bedtime as needed for moderate pain.     Marland Kitchen albuterol (PROVENTIL) (2.5 MG/3ML) 0.083% nebulizer solution Take 3 mLs (2.5 mg total) by nebulization every 4 (four) hours as needed for wheezing or shortness of breath. 75 mL 5  . aspirin 81 MG chewable tablet Chew 81 mg by mouth every morning.     Marland Kitchen atorvastatin (LIPITOR) 20 MG tablet TAKE ONE TABLET (20MG TOTAL) BY MOUTH DAILY 90 tablet 3  . bisoprolol (ZEBETA) 10 MG tablet TAKE ONE (1) TABLET BY MOUTH EVERY DAY 90 tablet 3  . Budeson-Glycopyrrol-Formoterol (BREZTRI AEROSPHERE) 160-9-4.8 MCG/ACT AERO Inhale 2 puffs into the lungs 2 (two) times daily. 11.8 g 0  . Cholecalciferol (VITAMIN D) 50 MCG (2000 UT) CAPS Take by mouth daily.    . Continuous Blood Gluc Receiver (FREESTYLE LIBRE 14 DAY READER) DEVI See admin instructions. 6 each 1  . Continuous Blood Gluc Sensor (FREESTYLE LIBRE 14 DAY SENSOR) MISC Inject 1 each into the skin See admin instructions. 6 each 1  . cyanocobalamin (,VITAMIN B-12,) 1000 MCG/ML injection Inject 1 mL (1,000 mcg total) into the muscle every 30 (thirty) days. 1 mL 11  . dicyclomine (BENTYL) 10 MG capsule Take 1 capsule (10 mg total) by mouth as needed for spasms (30 mins before meals and at bedtime as needed for diarrhea and abdominal cramps.). Take no more than four times daily but take only as needed 120 capsule 1  . empagliflozin (JARDIANCE) 10 MG TABS tablet Take 10 mg by mouth daily before breakfast. 30 tablet 4  . gabapentin (NEURONTIN) 300 MG capsule TAKE ONE CAPSULE (300 MG TOTAL) BY MOUTHTWO TIMES DAILY. 180 capsule 1  . glucose blood  (ONETOUCH ULTRA) test strip Use as instructed 100 each 12  . glucose monitoring kit (FREESTYLE) monitoring kit Use to check blood sugar BID as directed 1 each 0  . lactase (LACTAID) 3000 units tablet Take by mouth as needed.    . Lancets (ONETOUCH DELICA PLUS INOMVE72C) MISC 1 each by Does not apply route 2 (two) times a day. 100 each 3  . losartan (COZAAR) 100 MG tablet TAKE ONE (1) TABLET BY MOUTH EVERY DAY 90 tablet 3  . metFORMIN (GLUCOPHAGE) 1000 MG tablet Take 1 tablet (1,000 mg total) by mouth 2 (two) times daily with a meal. (Patient taking differently: Take 500 mg by mouth 2 (two) times daily with a meal. Takes 500 mg twice daily.  Total of 1000 mg daily.) 180 tablet 3  . Misc. Devices MISC Please provide supplies (needle, syringe, alcohol swabs) needed for patient to self-administer B-12 injections monthly. 1 each 12  . pantoprazole (PROTONIX) 40 MG tablet TAKE ONE (1) TABLET 30 MINS BEFORE YOUR FIRST MEAL. 90 tablet 3  .  potassium chloride (KLOR-CON) 10 MEQ tablet Take 1 tablet (10 mEq total) by mouth daily. 90 tablet 3  . triamcinolone (KENALOG) 0.1 % Apply 1 application topically 2 (two) times daily. 30 g 0  . ULTICARE TUBERCULIN SAFETY SYR 25G X 1" 1 ML MISC USE TO ADMINISTER INJECTABLE VITAMIN B12. 1 each 11   No current facility-administered medications for this visit.    Allergies as of 09/02/2020 - Review Complete 09/02/2020  Allergen Reaction Noted  . Bee venom Swelling 05/19/2016  . Codeine Anaphylaxis 04/30/2016  . Penicillins Swelling and Rash 03/07/2012  . Bupropion Other (See Comments) 10/04/2017  . Sudafed [pseudoephedrine hcl] Rash 06/27/2013    Family History  Adopted: Yes  Problem Relation Age of Onset  . Hypertension Father   . Coronary artery disease Sister   . Ulcers Maternal Grandmother   . Colon cancer Neg Hx        unknown, was adopted    Social History   Socioeconomic History  . Marital status: Married    Spouse name: [name]  . Number of  children: 1  . Years of education: 69  . Highest education level: Not on file  Occupational History  . Occupation: disabled    Comment: heart  Tobacco Use  . Smoking status: Light Tobacco Smoker    Packs/day: 0.25    Years: 44.00    Pack years: 11.00    Types: Cigarettes    Start date: 05/10/1975  . Smokeless tobacco: Never Used  . Tobacco comment: 2 cigarettes per day 12/10/2019  Vaping Use  . Vaping Use: Former  Substance and Sexual Activity  . Alcohol use: Not Currently    Alcohol/week: 2.0 standard drinks    Types: 2 Glasses of wine per week  . Drug use: No  . Sexual activity: Yes    Birth control/protection: Surgical  Other Topics Concern  . Not on file  Social History Narrative   Disabled   Lives with husband [name]   Two dogs   Social Determinants of Health   Financial Resource Strain:   . Difficulty of Paying Living Expenses: Not on file  Food Insecurity:   . Worried About Charity fundraiser in the Last Year: Not on file  . Ran Out of Food in the Last Year: Not on file  Transportation Needs:   . Lack of Transportation (Medical): Not on file  . Lack of Transportation (Non-Medical): Not on file  Physical Activity:   . Days of Exercise per Week: Not on file  . Minutes of Exercise per Session: Not on file  Stress:   . Feeling of Stress : Not on file  Social Connections:   . Frequency of Communication with Friends and Family: Not on file  . Frequency of Social Gatherings with Friends and Family: Not on file  . Attends Religious Services: Not on file  . Active Member of Clubs or Organizations: Not on file  . Attends Archivist Meetings: Not on file  . Marital Status: Not on file    Subjective: Review of Systems  Constitutional: Negative for chills and fever.  HENT: Negative for congestion and hearing loss.   Eyes: Negative for blurred vision and double vision.  Respiratory: Negative for cough and shortness of breath.   Cardiovascular: Negative for  chest pain and palpitations.  Gastrointestinal: Positive for diarrhea and heartburn. Negative for abdominal pain, blood in stool, constipation, melena and vomiting.  Genitourinary: Negative for dysuria and urgency.  Musculoskeletal: Negative for  joint pain and myalgias.  Skin: Negative for itching and rash.  Neurological: Negative for dizziness and headaches.  Psychiatric/Behavioral: Negative for depression. The patient is not nervous/anxious.      Objective: BP (!) 177/77   Pulse 82   Temp (!) 97.1 F (36.2 C) (Temporal)   Ht '5\' 4"'  (1.626 m)   Wt 145 lb 12.8 oz (66.1 kg)   BMI 25.03 kg/m  Physical Exam Constitutional:      Appearance: Normal appearance.  HENT:     Head: Normocephalic and atraumatic.  Eyes:     Extraocular Movements: Extraocular movements intact.     Conjunctiva/sclera: Conjunctivae normal.  Cardiovascular:     Rate and Rhythm: Normal rate and regular rhythm.  Pulmonary:     Effort: Pulmonary effort is normal.     Breath sounds: Normal breath sounds.  Abdominal:     General: Bowel sounds are normal.     Palpations: Abdomen is soft.  Musculoskeletal:        General: No swelling. Normal range of motion.     Cervical back: Normal range of motion and neck supple.  Skin:    General: Skin is warm and dry.     Coloration: Skin is not jaundiced.  Neurological:     General: No focal deficit present.     Mental Status: She is alert and oriented to person, place, and time.  Psychiatric:        Mood and Affect: Mood normal.        Behavior: Behavior normal.      Assessment: *Irritable bowel syndrome-diarrhea predominant *Chronic reflux-well-controlled on daily pantoprazole *Iron deficiency anemia  Plan: Patient irritable bowel syndrome appears to be relatively well controlled on as needed dicyclomine.  We will continue.  Chronic reflux well controlled on daily pantoprazole.  Year supply of refills sent in from office today.  Iron deficiency anemia is  being managed by hematology.  Does have a history of gastric and duodenal AVMs status post ablation.  Patient currently getting worked up for adrenal hyperplasia by endocrinology.  Possible this could be playing a role in her GI symptoms.  Will await final results.  Patient follow-up in 6 months or sooner if needed.  09/02/2020 2:27 PM   Disclaimer: This note was dictated with voice recognition software. Similar sounding words can inadvertently be transcribed and may not be corrected upon review.

## 2020-09-02 NOTE — Patient Instructions (Signed)
Continue on pantoprazole for chronic reflux/heartburn.  I sent in a year supply refills today in clinic.  Continue on dicyclomine as needed for diarrhea/abdominal pain.  Follow-up with GI in 6 months or sooner if needed.  At [facility] we value your feedback. You may receive a survey about your visit today. Please share your experience as we strive to create trusting relationships with our patients to provide genuine, compassionate, quality care.  We appreciate your understanding and patience as we review any laboratory studies, imaging, and other diagnostic tests that are ordered as we care for you. Our office policy is 5 business days for review of these results, and any emergent or urgent results are addressed in a timely manner for your best interest. If you do not hear from our office in 1 week, please contact us.   We also encourage the use of MyChart, which contains your medical information for your review as well. If you are not enrolled in this feature, an access code is on this after visit summary for your convenience. Thank you for allowing [place] to be involved in your care.  It was great to see you today!  I hope you have a great rest of your winter!!    Tammy Mcpherson. Tammy Mcpherson, D.O. Gastroenterology and Hepatology [facility]

## 2020-09-02 NOTE — Telephone Encounter (Signed)
Tammy Pharmacist at  [facility] is calling to clarify the rx for freestyle libre rx written  #6 is that six boxes . Please confirm

## 2020-09-02 NOTE — Telephone Encounter (Signed)
Pls clarify sig the free style libre dispense. Pharmacist is requesting understanding.

## 2020-09-03 ENCOUNTER — Encounter: Payer: Self-pay | Admitting: Registered Nurse

## 2020-09-03 NOTE — Telephone Encounter (Signed)
Pt was recommended by GI to take Rx for anxiety she's asking you to prescribe this I have asked her to make an appointment but she states you just said she could call for you please advise do you need an appointment? She does not name the medication

## 2020-09-03 NOTE — Telephone Encounter (Signed)
Please advise 

## 2020-09-07 ENCOUNTER — Encounter: Payer: Self-pay | Admitting: "Endocrinology

## 2020-09-07 ENCOUNTER — Other Ambulatory Visit: Payer: Self-pay

## 2020-09-07 ENCOUNTER — Encounter: Payer: Self-pay | Admitting: Registered Nurse

## 2020-09-07 ENCOUNTER — Ambulatory Visit (INDEPENDENT_AMBULATORY_CARE_PROVIDER_SITE_OTHER): Payer: PPO | Admitting: "Endocrinology

## 2020-09-07 VITALS — BP 170/84 | HR 76 | Ht 64.0 in | Wt 149.0 lb

## 2020-09-07 DIAGNOSIS — E278 Other specified disorders of adrenal gland: Secondary | ICD-10-CM

## 2020-09-07 MED ORDER — SPIRONOLACTONE 25 MG PO TABS: 25.0000 mg | ORAL_TABLET | Freq: Every day | ORAL | 3 refills | Status: DC

## 2020-09-07 NOTE — Progress Notes (Signed)
09/07/2020, 4:50 PM  Endocrinology follow-up note   Subjective:    [name], adult    DOB: 10/26/1955, PCP [name], NP   Past Medical History:  Diagnosis Date  . Anemia 04/30/2016  . Anxiety   . Blood transfusion without reported diagnosis   . Cardiomyopathy ([place])    a. EF 40-45% by echo in 04/2016 b. Improved to 60-65% by repeat imaging in 2018  . CHF (congestive heart failure) ([place])   . COPD (chronic obstructive pulmonary disease) ([place])   . Coronary artery calcification seen on CT scan   . Essential hypertension   . GERD (gastroesophageal reflux disease)   . History of bronchitis   . History of GI bleed   . Hyperlipidemia   . Iron deficiency anemia 05/12/2016   Bleeding ulcers  . Leukocytosis 03/23/2020  . Pollen allergy   . PSVT (paroxysmal supraventricular tachycardia) ([place])   . PVC's (premature ventricular contractions)   . Thrombocytosis 03/23/2020  . Vitamin B12 deficiency 03/23/2020   Past Surgical History:  Procedure Laterality Date  . ABDOMINAL HYSTERECTOMY     polyps  about age 60  . Barbie Banner OSTEOTOMY Right 07/10/2013   Procedure: Barbie Banner OSTEOTOMY RIGHT FOOT;  Surgeon: Marcheta Grammes, DPM;  Location: [facility];  Service: Orthopedics;  Laterality: Right;  . AMPUTATION Left 05/09/2017   Procedure: AMPUTATION 5TH TOE LEFT FOOT;  Surgeon: [name], DPM;  Location: [facility];  Service: Podiatry;  Laterality: Left;  . AMPUTATION Left 06/13/2017   Procedure: AMPUTATION 2ND TOE LEFT FOOT;  Surgeon: [name], DPM;  Location: [facility];  Service: Podiatry;  Laterality: Left;  . BUNIONECTOMY Right 07/10/2013   Procedure: VOGLER BUNIONECTOMY RIGHT FOOT;  Surgeon: Marcheta Grammes, DPM;  Location: [facility];  Service: Orthopedics;  Laterality: Right;  . CHOLECYSTECTOMY N/A 08/22/2017   Procedure: LAPAROSCOPIC CHOLECYSTECTOMY;  Surgeon: Virl Cagey, MD;  Location: [facility];  Service:  General;  Laterality: N/A;  . COLONOSCOPY N/A 07/07/2016   Dr. Oneida Alar; redundant left colon, diverticulosis at hepatic flexure, non-bleeding internal hemorrhoids  . ESOPHAGOGASTRODUODENOSCOPY N/A 07/07/2016   Dr. Oneida Alar: many non-bleeding cratered gastric ulcers without stigmata of bleeding in gastric antrum. four 2-3 mm angioectasias without bleeding in duodenal bulb and second portion of duodenum s/p APC. Chroni gastritis on path.   . ESOPHAGOGASTRODUODENOSCOPY N/A 08/03/2017   Dr. Oneida Alar: erosive gastritis, AVMs. Found a single non-bleeding angioectasia in stomach, s/p APC therapy. Four non-bleeding angioectasias in duodenum s/p APC. Non-bleeding erosive gastropathy  . IR ANGIOGRAM EXTREMITY LEFT  04/24/2017  . IR FEM POP ART ATHERECT INC PTA MOD SED  04/24/2017  . IR INFUSION THROMBOL ARTERIAL INITIAL (MS)  04/24/2017  . IR RADIOLOGIST EVAL & MGMT  12/05/2016  . IR US GUIDE VASC ACCESS RIGHT  04/24/2017  . LIVER BIOPSY N/A 08/22/2017   Procedure: LIVER BIOPSY;  Surgeon: Virl Cagey, MD;  Location: [facility];  Service: General;  Laterality: N/A;  . METATARSAL HEAD EXCISION Right 07/10/2013   Procedure: METATARSAL HEAD RESECTION OF DIGITS 2 AND 3 RIGHT FOOT;  Surgeon: Marcheta Grammes, DPM;  Location: [facility];  Service: Orthopedics;  Laterality: Right;  . PROXIMAL INTERPHALANGEAL FUSION (PIP) Right 07/10/2013   Procedure: ARTHRODESIS PIPJ  2ND DIGIT RIGHT FOOT;  Surgeon: Marcheta Grammes, DPM;  Location: [facility];  Service: Orthopedics;  Laterality: Right;   Social History   Socioeconomic History  . Marital status: Married    Spouse name: Tammy Mcpherson  . Number of children: 1  . Years of education: 12  . Highest education level: Not on file  Occupational History  . Occupation: disabled    Comment: heart  Tobacco Use  . Smoking status: Light Tobacco Smoker    Packs/day: 0.25    Years: 44.00    Pack years: 11.00    Types: Cigarettes    Start date: 05/10/1975  . Smokeless tobacco: Never  Used  . Tobacco comment: 2 cigarettes per day 12/10/2019  Vaping Use  . Vaping Use: Former  Substance and Sexual Activity  . Alcohol use: Not Currently    Alcohol/week: 2.0 standard drinks    Types: 2 Glasses of wine per week  . Drug use: No  . Sexual activity: Yes    Birth control/protection: Surgical  Other Topics Concern  . Not on file  Social History Narrative   Disabled   Lives with husband Tammy Mcpherson   Two dogs   Social Determinants of Health   Financial Resource Strain:   . Difficulty of Paying Living Expenses: Not on file  Food Insecurity:   . Worried About Charity fundraiser in the Last Year: Not on file  . Ran Out of Food in the Last Year: Not on file  Transportation Needs:   . Lack of Transportation (Medical): Not on file  . Lack of Transportation (Non-Medical): Not on file  Physical Activity:   . Days of Exercise per Week: Not on file  . Minutes of Exercise per Session: Not on file  Stress:   . Feeling of Stress : Not on file  Social Connections:   . Frequency of Communication with Friends and Family: Not on file  . Frequency of Social Gatherings with Friends and Family: Not on file  . Attends Religious Services: Not on file  . Active Member of Clubs or Organizations: Not on file  . Attends Archivist Meetings: Not on file  . Marital Status: Not on file   Family History  Adopted: Yes  Problem Relation Age of Onset  . Hypertension Father   . Coronary artery disease Sister   . Ulcers Maternal Grandmother   . Colon cancer Neg Hx        unknown, was adopted   Outpatient Encounter Medications as of 09/07/2020  Medication Sig  . acetaminophen (TYLENOL) 500 MG tablet Take 500 mg by mouth at bedtime as needed for moderate pain.   Marland Kitchen albuterol (PROVENTIL) (2.5 MG/3ML) 0.083% nebulizer solution Take 3 mLs (2.5 mg total) by nebulization every 4 (four) hours as needed for wheezing or shortness of breath.  Marland Kitchen aspirin 81 MG chewable tablet Chew 81 mg by mouth  every morning.   Marland Kitchen atorvastatin (LIPITOR) 20 MG tablet TAKE ONE TABLET (20MG TOTAL) BY MOUTH DAILY  . bisoprolol (ZEBETA) 10 MG tablet TAKE ONE (1) TABLET BY MOUTH EVERY DAY  . Budeson-Glycopyrrol-Formoterol (BREZTRI AEROSPHERE) 160-9-4.8 MCG/ACT AERO Inhale 2 puffs into the lungs 2 (two) times daily.  . Cholecalciferol (VITAMIN D) 50 MCG (2000 UT) CAPS Take by mouth daily.  . Continuous Blood Gluc Receiver (FREESTYLE LIBRE 14 DAY READER) DEVI See admin instructions.  . Continuous Blood Gluc Sensor (FREESTYLE LIBRE 14 DAY SENSOR) MISC Inject 1 each into the skin See admin instructions.  Marland Kitchen  cyanocobalamin (,VITAMIN B-12,) 1000 MCG/ML injection Inject 1 mL (1,000 mcg total) into the muscle every 30 (thirty) days.  Marland Kitchen dicyclomine (BENTYL) 10 MG capsule Take 1 capsule (10 mg total) by mouth as needed for spasms (30 mins before meals and at bedtime as needed for diarrhea and abdominal cramps.). Take no more than four times daily but take only as needed  . empagliflozin (JARDIANCE) 10 MG TABS tablet Take 10 mg by mouth daily before breakfast.  . gabapentin (NEURONTIN) 300 MG capsule TAKE ONE CAPSULE (300 MG TOTAL) BY MOUTHTWO TIMES DAILY.  Marland Kitchen glucose blood (ONETOUCH ULTRA) test strip Use as instructed  . glucose monitoring kit (FREESTYLE) monitoring kit Use to check blood sugar BID as directed  . lactase (LACTAID) 3000 units tablet Take by mouth as needed.  . Lancets (ONETOUCH DELICA PLUS [id number]) MISC 1 each by Does not apply route 2 (two) times a day.  . losartan (COZAAR) 100 MG tablet TAKE ONE (1) TABLET BY MOUTH EVERY DAY  . metFORMIN (GLUCOPHAGE) 1000 MG tablet Take 1 tablet (1,000 mg total) by mouth 2 (two) times daily with a meal. (Patient taking differently: Take 500 mg by mouth 2 (two) times daily with a meal. Takes 500 mg twice daily.  Total of 1000 mg daily.)  . Misc. Devices MISC Please provide supplies (needle, syringe, alcohol swabs) needed for patient to self-administer B-12 injections  monthly.  . pantoprazole (PROTONIX) 40 MG tablet TAKE ONE (1) TABLET 30 MINS BEFORE YOUR FIRST MEAL.  Marland Kitchen spironolactone (ALDACTONE) 25 MG tablet Take 1 tablet (25 mg total) by mouth daily.  Marland Kitchen triamcinolone (KENALOG) 0.1 % Apply 1 application topically 2 (two) times daily.  Marland Kitchen ULTICARE TUBERCULIN SAFETY SYR 25G X 1" 1 ML MISC USE TO ADMINISTER INJECTABLE VITAMIN B12.  . [DISCONTINUED] potassium chloride (KLOR-CON) 10 MEQ tablet Take 1 tablet (10 mEq total) by mouth daily.   No facility-administered encounter medications on file as of 09/07/2020.   ALLERGIES: Allergies  Allergen Reactions  . Bee Venom Swelling  . Codeine Anaphylaxis    Tongue swelled  . Penicillins Swelling and Rash    Has patient had a PCN reaction causing immediate rash, facial/tongue/throat swelling, SOB or lightheadedness with hypotension: No, delayed Has patient had a PCN reaction causing severe rash involving mucus membranes or skin necrosis: No Has patient had a PCN reaction that required hospitalization No Has patient had a PCN reaction occurring within the last 10 years: No If all of the above answers are "NO", then may proceed with Cephalosporin use.   . Bupropion Other (See Comments)    "Made my skin crawl"  . Sudafed [Pseudoephedrine Hcl] Rash    VACCINATION STATUS: Immunization History  Administered Date(s) Administered  . Influenza,inj,Quad PF,6+ Mos 07/04/2017, 07/10/2018, 07/02/2019, 07/07/2020  . Pneumococcal Polysaccharide-23 11/01/2018    HPI MITZY NARON is 64 y.o. adult who presents today with her lab results after she was seen in consultation for adrenal hyperplasia requested by her PCP   [name], NP.  See notes from prior visit. History is obtained directly from the patient and chart review.  She denies any prior history of adrenal, thyroid, pituitary, parathyroid dysfunction.   On June 17, 2020 she underwent abdominal/pelvic CT scan with contrast for work-up of diarrhea and  epigastric pain.  Incidentally she was found to have vaguely described thickening of the adrenal glands most consistent with hyperplasia, no enhancing lesions ,  no discrete adenoma/mass lesion.  Of note, in 2017 abdominal imaging did  not mention the adrenal hyperplasia. Her previsit 24-hour urine studies reveal mildly elevated 24-hour urine dopamine at 626, 24-hour urine metanephrines slightly elevated at 255.  Normal urine free cortisol in 24 hours.   She has type 2 diabetes on treatment with  Jardiance, and Metformin by her PMD.  Her most recent A1c was 8.7% in March 2021. She has beta-blocker and ARB for blood pressure management. She is a chronic heavy smoker with COPD on bronchodilators.  She denies any family history of adrenal, thyroid, pituitary dysfunction. She denies any history of difficult to control hypertension, diaphoresis, nor paroxysmal headaches. She reports fluctuating body weight, currently weighs 145 pounds with BMI of 24.9.  Review of Systems  Limited as above.  Objective:    Vitals with BMI 09/07/2020 09/02/2020 09/01/2020  Height 5' 4" 5' 4" -  Weight 149 lbs 145 lbs 13 oz -  BMI 40.10 27.25 -  Systolic 366 440 347  Diastolic 84 77 84  Pulse 76 82 -    BP (!) 170/84   Pulse 76   Ht 5' 4" (1.626 m)   Wt 149 lb (67.6 kg)   BMI 25.58 kg/m   Wt Readings from Last 3 Encounters:  09/07/20 149 lb (67.6 kg)  09/02/20 145 lb 12.8 oz (66.1 kg)  09/01/20 145 lb 3.2 oz (65.9 kg)    Physical Exam  CMP ( most recent) CMP     Component Value Date/Time   NA 141 09/01/2020 1510   K 4.4 09/01/2020 1510   CL 104 09/01/2020 1510   CO2 23 09/01/2020 1510   GLUCOSE 177 (H) 09/01/2020 1510   GLUCOSE 254 (H) 05/19/2020 1415   BUN 7 (L) 09/01/2020 1510   CREATININE 0.55 (L) 09/01/2020 1510   CREATININE 0.73 09/28/2017 1201   CALCIUM 10.0 09/01/2020 1510   PROT 7.1 09/01/2020 1510   ALBUMIN 4.2 09/01/2020 1510   AST 12 09/01/2020 1510   ALT 17 09/01/2020 1510    ALKPHOS 126 (H) 09/01/2020 1510   BILITOT 0.3 09/01/2020 1510   GFRNONAA 100 09/01/2020 1510   GFRNONAA 90 09/28/2017 1201   GFRAA 115 09/01/2020 1510   GFRAA 104 09/28/2017 1201     Diabetic Labs (most recent): Lab Results  Component Value Date   HGBA1C 8.6 (A) 09/01/2020   HGBA1C 8.7 (A) 12/23/2019   HGBA1C 7.0 (H) 10/13/2019     Lipid Panel ( most recent) Lipid Panel     Component Value Date/Time   CHOL 202 (H) 09/01/2020 1510   TRIG 335 (H) 09/01/2020 1510   HDL 31 (L) 09/01/2020 1510   CHOLHDL 6.5 (H) 09/01/2020 1510   CHOLHDL 4.4 09/28/2017 1201   VLDL NOT CALC 03/30/2017 1520   LDLCALC 113 (H) 09/01/2020 1510   LDLCALC 88 09/28/2017 1201   LABVLDL 58 (H) 09/01/2020 1510      Lab Results  Component Value Date   TSH 1.727 04/30/2016    Recent Results (from the past 2160 hour(s))  CBC with Differential     Status: Abnormal   Collection Time: 06/17/20  1:19 PM  Result Value Ref Range   WBC 15.3 (H) 4.0 - 10.5 K/uL   RBC 4.52 3.87 - 5.11 MIL/uL   Hemoglobin 14.2 12.0 - 15.0 g/dL   HCT 44.1 36 - 46 %   MCV 97.6 80.0 - 100.0 fL   MCH 31.4 26.0 - 34.0 pg   MCHC 32.2 30.0 - 36.0 g/dL   RDW 14.4 11.5 - 15.5 %  Platelets 423 (H) 150 - 400 K/uL   nRBC 0.0 0.0 - 0.2 %   Neutrophils Relative % 74 %   Neutro Abs 11.4 (H) 1.7 - 7.7 K/uL   Lymphocytes Relative 13 %   Lymphs Abs 1.9 0.7 - 4.0 K/uL   Monocytes Relative 10 %   Monocytes Absolute 1.6 (H) 0.1 - 1.0 K/uL   Eosinophils Relative 1 %   Eosinophils Absolute 0.2 0.0 - 0.5 K/uL   Basophils Relative 1 %   Basophils Absolute 0.1 0.0 - 0.1 K/uL   Immature Granulocytes 1 %   Abs Immature Granulocytes 0.13 (H) 0.00 - 0.07 K/uL    Comment: Performed at [facility], [address] [place], [place] [address]  Ferritin     Status: None   Collection Time: 06/17/20  1:19 PM  Result Value Ref Range   Ferritin 117 11 - 307 ng/mL    Comment: Performed at [facility], [address] [place], [place] [address]  Iron  and TIBC     Status: None   Collection Time: 06/17/20  1:19 PM  Result Value Ref Range   Iron 79 28 - 170 ug/dL   TIBC 393 250 - 450 ug/dL   Saturation Ratios 20 10.4 - 31.8 %   UIBC 314 ug/dL    Comment: Performed at [facility], [address] [place], [place] [address]  POCT urinalysis dipstick     Status: Abnormal   Collection Time: 06/17/20  3:34 PM  Result Value Ref Range   Color, UA light yellow (A) yellow   Clarity, UA clear clear   Glucose, UA =100 (A) negative mg/dL   Bilirubin, UA negative negative   Ketones, POC UA negative negative mg/dL   Spec Grav, UA <=1.005 (A) 1.010 - 1.025   Blood, UA negative negative   pH, UA 6.0 5.0 - 8.0   Protein Ur, POC negative negative mg/dL   Urobilinogen, UA 0.2 0.2 or 1.0 E.U./dL   Nitrite, UA Negative Negative   Leukocytes, UA Moderate (2+) (A) Negative  Urine culture     Status: Abnormal   Collection Time: 06/17/20  3:34 PM   Specimen: Urine, Random  Result Value Ref Range   Specimen Description      URINE, RANDOM Performed at [facility], [address] [place], [place] [address]    Special Requests      NONE Performed at [facility], [address], [place], [place] [address]    Culture >=100,000 COLONIES/mL ESCHERICHIA COLI (A)    Report Status 06/19/2020 FINAL    Organism ID, Bacteria ESCHERICHIA COLI (A)       Susceptibility   Escherichia coli - MIC*    AMPICILLIN <=2 SENSITIVE Sensitive     CEFAZOLIN <=4 SENSITIVE Sensitive     CEFTRIAXONE <=0.25 SENSITIVE Sensitive     CIPROFLOXACIN <=0.25 SENSITIVE Sensitive     GENTAMICIN <=1 SENSITIVE Sensitive     IMIPENEM <=0.25 SENSITIVE Sensitive     NITROFURANTOIN <=16 SENSITIVE Sensitive     TRIMETH/SULFA >=320 RESISTANT Resistant     AMPICILLIN/SULBACTAM <=2 SENSITIVE Sensitive     PIP/TAZO <=4 SENSITIVE Sensitive     * >=100,000 COLONIES/mL ESCHERICHIA COLI  IgA     Status: Abnormal   Collection Time: 07/07/20  9:51 AM  Result Value Ref Range   IgA 548  (H) 87 - 352 mg/dL    Comment: (NOTE) Performed At: [facility] [address] [place], [place] [id number] Rush Farmer  MD [id number]   Tissue transglutaminase, IgG     Status: None   Collection Time: 07/07/20  9:51 AM  Result Value Ref Range   Tissue Transglut Ab <2 0 - 5 U/mL    Comment: (NOTE)                              Negative        0 - 5                              Weak Positive   6 - 9                              Positive           >9 Performed At: [facility] [place], [place] [id number] Rush Farmer MD [id number]   Cortisol, urine, free     Status: None   Collection Time: 08/25/20  2:00 PM  Result Value Ref Range   Cortisol,F,ug/L,U 13 Undefined ug/L   Cortisol (Ur), Free 40 6 - 42 ug/24 hr  Creatinine, urine, 24 hour     Status: None   Collection Time: 08/25/20  2:00 PM  Result Value Ref Range   Creatinine, Urine 30.5 Not Estab. mg/dL   Creatinine, 24H Ur 915 800 - 1,800 mg/24 hr  Metanephrines, urine, 24 hour     Status: Abnormal   Collection Time: 08/25/20  2:00 PM  Result Value Ref Range   Normetanephrine, Ur 206 Undefined ug/L   Normetanephrine, 24H Ur 618 (H) 131 - 612 ug/24 hr   Metaneph Total, Ur 85 Undefined ug/L   Metanephrines, 24H Ur 255 (H) 36 - 209 ug/24 hr  Catecholamines, fractionated, urine, 24 hour     Status: Abnormal   Collection Time: 08/25/20  2:00 PM  Result Value Ref Range   Epinephrine, Rand Ur 3 Undefined ug/L   Epinephrine, 24H Ur 9 0 - 20 ug/24 hr   Norepinephrine, Rand Ur 29 Undefined ug/L   Norepinephrine, 24H Ur 90 0 - 135 ug/24 hr   Dopamine, Rand Ur 202 Undefined ug/L   Dopamine , 24H Ur 626 (H) 0 - 510 ug/24 hr  Comprehensive metabolic panel     Status: Abnormal   Collection Time: 09/01/20  3:10 PM  Result Value Ref Range   Glucose 177 (H) 65 - 99 mg/dL   BUN 7 (L) 8 - 27 mg/dL   Creatinine, Ser 0.55 (L) 0.57 - 1.00 mg/dL   GFR calc non Af Amer 100 >59 mL/min/1.73   GFR calc Af  Amer 115 >59 mL/min/1.73    Comment: **In accordance with recommendations from the NKF-ASN Task force,**   [facility] is in the process of updating its eGFR calculation to the   2021 CKD-EPI creatinine equation that estimates kidney function   without a race variable.    BUN/Creatinine Ratio 13 12 - 28   Sodium 141 134 - 144 mmol/L   Potassium 4.4 3.5 - 5.2 mmol/L   Chloride 104 96 - 106 mmol/L   CO2 23 20 - 29 mmol/L   Calcium 10.0 8.7 - 10.3 mg/dL   Total Protein 7.1 6.0 - 8.5 g/dL   Albumin 4.2 3.8 - 4.8 g/dL   Globulin, Total 2.9 1.5 - 4.5 g/dL   Albumin/Globulin Ratio 1.4 1.2 -  2.2   Bilirubin Total 0.3 0.0 - 1.2 mg/dL   Alkaline Phosphatase 126 (H) 44 - 121 IU/L    Comment:               **Please note reference interval change**   AST 12 0 - 40 IU/L   ALT 17 0 - 32 IU/L  CBC     Status: Abnormal   Collection Time: 09/01/20  3:10 PM  Result Value Ref Range   WBC 12.3 (H) 3.4 - 10.8 x10E3/uL   RBC 4.81 3.77 - 5.28 x10E6/uL   Hemoglobin 15.5 11.1 - 15.9 g/dL   Hematocrit 45.1 34.0 - 46.6 %   MCV 94 79 - 97 fL   MCH 32.2 26.6 - 33.0 pg   MCHC 34.4 31 - 35 g/dL   RDW 12.8 11.7 - 15.4 %   Platelets 370 150 - 450 x10E3/uL  Lipid panel     Status: Abnormal   Collection Time: 09/01/20  3:10 PM  Result Value Ref Range   Cholesterol, Total 202 (H) 100 - 199 mg/dL   Triglycerides 335 (H) 0 - 149 mg/dL   HDL 31 (L) >39 mg/dL   VLDL Cholesterol Cal 58 (H) 5 - 40 mg/dL   LDL Chol Calc (NIH) 113 (H) 0 - 99 mg/dL   Chol/HDL Ratio 6.5 (H) 0.0 - 4.4 ratio    Comment:                                   T. Chol/HDL Ratio                                             Men  Women                               1/2 Avg.Risk  3.4    3.3                                   Avg.Risk  5.0    4.4                                2X Avg.Risk  9.6    7.1                                3X Avg.Risk 23.4   11.0   POCT glycosylated hemoglobin (Hb A1C)     Status: Abnormal   Collection Time: 09/01/20  3:16  PM  Result Value Ref Range   Hemoglobin A1C 8.6 (A) 4.0 - 5.6 %   HbA1c POC (<> result, manual entry)     HbA1c, POC (prediabetic range)     HbA1c, POC (controlled diabetic range)          CT abdomen/pelvis June 17, 2020 -------- Adrenals/urinary tract: Thickening of the adrenal glands most consistent with hyperplasia. No enhancing renal lesion. No renal obstruction. Ureters and bladder normal. ------------  IMPRESSION: 1. No acute findings in the abdomen pelvis. 2. No diverticulosis.  No bowel inflammation. 3. Postcholecystectomy and hysterectomy. 4. Aortic  Atherosclerosis (ICD10-I70.0).   Assessment & Plan:   1. Adrenal hyperplasia ([place])  - Based on these reviews, she has incidental finding of thickening of bilateral adrenals with no discrete mass lesions or enhancing lesions. -Her previsit 24-hour urine studies are not conclusive for pheochromocytoma.  She does not have hypercortisolemia. She has uncontrolled hypertension, may benefit from spironolactone.  I discussed and added spironolactone 25 mg p.o. daily at breakfast, advised to discontinue potassium supplement. -She is also taking Tylenol upto 1500 mg daily which may interfere with urinary metanephrine measurement.  She will have free plasma metanephrines before her next visit.  -Her next CT scan will be adrenal protocol, 5-monthfrom the last one ( in March 2022) to characterize the adrenal findings better.  She is extensively counseled against smoking. The patient was counseled on the dangers of tobacco use, and was advised to quit.  Reviewed strategies to maximize success, including removing cigarettes and smoking materials from environment.   She wishes to follow-up with her PMD about diabetes management.  She is currently on Jardiance 10 mg p.o. daily and Metformin 1000 mg p.o. twice daily.  She did not have recent A1c, already has a follow-up appointment with her PMD.  - she is advised to maintain close  follow up with MMaximiano Coss NP for primary care needs.      - Time spent on this patient care encounter:  20 minutes of which 50% was spent in  counseling and the rest reviewing  her current and  previous labs / studies and medications  doses and developing a plan for long term care. DSuszanne Finch participated in the discussions, expressed understanding, and voiced agreement with the above plans.  All questions were answered to her satisfaction. she is encouraged to contact clinic should she have any questions or concerns prior to her return visit.   Follow up plan: Return for F/U with Pre-visit Labs.   GGlade Lloyd MD CAdventist Health VallejoGroup RArrow[facility] LLC[address]RFostoria Steele [id number]: [phone] Fax: [phone]    09/07/2020, 4:50 PM  This note was partially dictated with voice recognition software. Similar sounding words can be transcribed inadequately or may not  be corrected upon review.

## 2020-09-09 DIAGNOSIS — R0600 Dyspnea, unspecified: Secondary | ICD-10-CM

## 2020-09-09 DIAGNOSIS — R0609 Other forms of dyspnea: Secondary | ICD-10-CM

## 2020-09-09 DIAGNOSIS — J449 Chronic obstructive pulmonary disease, unspecified: Secondary | ICD-10-CM | POA: Diagnosis not present

## 2020-09-09 MED ORDER — ALBUTEROL SULFATE (2.5 MG/3ML) 0.083% IN NEBU: 2.5000 mg | INHALATION_SOLUTION | RESPIRATORY_TRACT | 5 refills | Status: DC | PRN

## 2020-09-09 NOTE — Telephone Encounter (Signed)
Called and spoke with patient to verify pharmacy she would like refill sent to. Nothing further needed at this time.

## 2020-09-09 NOTE — Telephone Encounter (Signed)
Aaron Edelman please advise on patient mychart message  I need a replacement nebulizer hose but [facility] in [place] needs a prescription refill to replace it. I also need a refill for my albutiral fluid packs. Can you call that in to [facility].  I will schedule a follow-up appointment in early January.  Thank you

## 2020-09-09 NOTE — Telephone Encounter (Signed)
Yes this is okay to place the order.  [name], FNP

## 2020-09-10 ENCOUNTER — Ambulatory Visit: Payer: PPO | Admitting: Cardiology

## 2020-09-10 ENCOUNTER — Other Ambulatory Visit: Payer: Self-pay

## 2020-09-10 ENCOUNTER — Encounter: Payer: Self-pay | Admitting: Cardiology

## 2020-09-10 VITALS — BP 160/82 | HR 82 | Resp 16 | Ht 65.0 in | Wt 145.0 lb

## 2020-09-10 DIAGNOSIS — Z8679 Personal history of other diseases of the circulatory system: Secondary | ICD-10-CM | POA: Diagnosis not present

## 2020-09-10 DIAGNOSIS — I471 Supraventricular tachycardia, unspecified: Secondary | ICD-10-CM

## 2020-09-10 DIAGNOSIS — I10 Essential (primary) hypertension: Secondary | ICD-10-CM

## 2020-09-10 NOTE — Progress Notes (Signed)
Cardiology Office Note  Date: 09/10/2020   ID: Mcpherson, Tammy 07-Jul-1956, MRN [id number]  PCP:  [name], NP  Cardiologist:  Rozann Lesches, MD Electrophysiologist:  None   Chief Complaint  Patient presents with  . Follow-up    6 mth    History of Present Illness: Tammy Mcpherson is a 64 y.o. adult last seen in June by Mr. Leonides Sake NP.  She presents for a follow-up visit.  From a cardiac perspective, she has not had any change in baseline stamina.  She has a history of nonischemic cardiomyopathy, last assessed by echocardiogram in 2019.  At that point LVEF was normal in the range of 65 to 70%.  She had a recent visit with Dr. Dorris Fetch for follow-up of adrenal hyperplasia. Urine studies were not conclusive for pheochromocytoma.  She is being treated for essential hypertension, Aldactone was just recently added.  She otherwise continues on Zebeta and Cozaar.  Past Medical History:  Diagnosis Date  . Anemia 04/30/2016  . Anxiety   . Blood transfusion without reported diagnosis   . Cardiomyopathy ([place])    a. EF 40-45% by echo in 04/2016 b. Improved to 60-65% by repeat imaging in 2018  . CHF (congestive heart failure) ([place])   . COPD (chronic obstructive pulmonary disease) ([place])   . Coronary artery calcification seen on CT scan   . Essential hypertension   . GERD (gastroesophageal reflux disease)   . History of bronchitis   . History of GI bleed   . Hyperlipidemia   . Iron deficiency anemia 05/12/2016   Bleeding ulcers  . Leukocytosis 03/23/2020  . Pollen allergy   . PSVT (paroxysmal supraventricular tachycardia) ([place])   . PVC's (premature ventricular contractions)   . Thrombocytosis 03/23/2020  . Vitamin B12 deficiency 03/23/2020    Past Surgical History:  Procedure Laterality Date  . ABDOMINAL HYSTERECTOMY     polyps  about age 30  . Barbie Banner OSTEOTOMY Right 07/10/2013   Procedure: Barbie Banner OSTEOTOMY RIGHT FOOT;  Surgeon: Marcheta Grammes, DPM;  Location: [facility];   Service: Orthopedics;  Laterality: Right;  . AMPUTATION Left 05/09/2017   Procedure: AMPUTATION 5TH TOE LEFT FOOT;  Surgeon: [name], DPM;  Location: [facility];  Service: Podiatry;  Laterality: Left;  . AMPUTATION Left 06/13/2017   Procedure: AMPUTATION 2ND TOE LEFT FOOT;  Surgeon: [name], DPM;  Location: [facility];  Service: Podiatry;  Laterality: Left;  . BUNIONECTOMY Right 07/10/2013   Procedure: VOGLER BUNIONECTOMY RIGHT FOOT;  Surgeon: Marcheta Grammes, DPM;  Location: [facility];  Service: Orthopedics;  Laterality: Right;  . CHOLECYSTECTOMY N/A 08/22/2017   Procedure: LAPAROSCOPIC CHOLECYSTECTOMY;  Surgeon: Virl Cagey, MD;  Location: [facility];  Service: General;  Laterality: N/A;  . COLONOSCOPY N/A 07/07/2016   Dr. Oneida Alar; redundant left colon, diverticulosis at hepatic flexure, non-bleeding internal hemorrhoids  . ESOPHAGOGASTRODUODENOSCOPY N/A 07/07/2016   Dr. Oneida Alar: many non-bleeding cratered gastric ulcers without stigmata of bleeding in gastric antrum. four 2-3 mm angioectasias without bleeding in duodenal bulb and second portion of duodenum s/p APC. Chroni gastritis on path.   . ESOPHAGOGASTRODUODENOSCOPY N/A 08/03/2017   Dr. Oneida Alar: erosive gastritis, AVMs. Found a single non-bleeding angioectasia in stomach, s/p APC therapy. Four non-bleeding angioectasias in duodenum s/p APC. Non-bleeding erosive gastropathy  . IR ANGIOGRAM EXTREMITY LEFT  04/24/2017  . IR FEM POP ART ATHERECT INC PTA MOD SED  04/24/2017  . IR INFUSION THROMBOL ARTERIAL INITIAL (MS)  04/24/2017  . IR RADIOLOGIST  EVAL & MGMT  12/05/2016  . IR US GUIDE VASC ACCESS RIGHT  04/24/2017  . LIVER BIOPSY N/A 08/22/2017   Procedure: LIVER BIOPSY;  Surgeon: Virl Cagey, MD;  Location: [facility];  Service: General;  Laterality: N/A;  . METATARSAL HEAD EXCISION Right 07/10/2013   Procedure: METATARSAL HEAD RESECTION OF DIGITS 2 AND 3 RIGHT FOOT;  Surgeon: Marcheta Grammes, DPM;  Location: [facility];   Service: Orthopedics;  Laterality: Right;  . PROXIMAL INTERPHALANGEAL FUSION (PIP) Right 07/10/2013   Procedure: ARTHRODESIS PIPJ  2ND DIGIT RIGHT FOOT;  Surgeon: Marcheta Grammes, DPM;  Location: [facility];  Service: Orthopedics;  Laterality: Right;    Current Outpatient Medications  Medication Sig Dispense Refill  . acetaminophen (TYLENOL) 500 MG tablet Take 500 mg by mouth at bedtime as needed for moderate pain.     Marland Kitchen albuterol (PROVENTIL) (2.5 MG/3ML) 0.083% nebulizer solution Take 3 mLs (2.5 mg total) by nebulization every 4 (four) hours as needed for wheezing or shortness of breath. 75 mL 5  . aspirin 81 MG chewable tablet Chew 81 mg by mouth every morning.     Marland Kitchen atorvastatin (LIPITOR) 20 MG tablet TAKE ONE TABLET (20MG TOTAL) BY MOUTH DAILY 90 tablet 3  . bisoprolol (ZEBETA) 10 MG tablet TAKE ONE (1) TABLET BY MOUTH EVERY DAY 90 tablet 3  . Budeson-Glycopyrrol-Formoterol (BREZTRI AEROSPHERE) 160-9-4.8 MCG/ACT AERO Inhale 2 puffs into the lungs 2 (two) times daily. 11.8 g 0  . Cholecalciferol (VITAMIN D) 50 MCG (2000 UT) CAPS Take by mouth daily.    . Continuous Blood Gluc Receiver (FREESTYLE LIBRE 14 DAY READER) DEVI See admin instructions. 6 each 1  . Continuous Blood Gluc Sensor (FREESTYLE LIBRE 14 DAY SENSOR) MISC Inject 1 each into the skin See admin instructions. 6 each 1  . cyanocobalamin (,VITAMIN B-12,) 1000 MCG/ML injection Inject 1 mL (1,000 mcg total) into the muscle every 30 (thirty) days. 1 mL 11  . dicyclomine (BENTYL) 10 MG capsule Take 1 capsule (10 mg total) by mouth as needed for spasms (30 mins before meals and at bedtime as needed for diarrhea and abdominal cramps.). Take no more than four times daily but take only as needed 120 capsule 1  . empagliflozin (JARDIANCE) 10 MG TABS tablet Take 10 mg by mouth daily before breakfast. 30 tablet 4  . gabapentin (NEURONTIN) 300 MG capsule TAKE ONE CAPSULE (300 MG TOTAL) BY MOUTHTWO TIMES DAILY. 180 capsule 1  . glucose blood  (ONETOUCH ULTRA) test strip Use as instructed 100 each 12  . glucose monitoring kit (FREESTYLE) monitoring kit Use to check blood sugar BID as directed 1 each 0  . lactase (LACTAID) 3000 units tablet Take by mouth as needed.    . Lancets (ONETOUCH DELICA PLUS [id number]) MISC 1 each by Does not apply route 2 (two) times a day. 100 each 3  . losartan (COZAAR) 100 MG tablet TAKE ONE (1) TABLET BY MOUTH EVERY DAY 90 tablet 3  . metFORMIN (GLUCOPHAGE) 1000 MG tablet Take 1 tablet (1,000 mg total) by mouth 2 (two) times daily with a meal. (Patient taking differently: Take 500 mg by mouth 2 (two) times daily with a meal. Takes 500 mg twice daily.  Total of 1000 mg daily.) 180 tablet 3  . Misc. Devices MISC Please provide supplies (needle, syringe, alcohol swabs) needed for patient to self-administer B-12 injections monthly. 1 each 12  . pantoprazole (PROTONIX) 40 MG tablet TAKE ONE (1) TABLET 30 MINS BEFORE YOUR FIRST  MEAL. 90 tablet 3  . spironolactone (ALDACTONE) 25 MG tablet Take 1 tablet (25 mg total) by mouth daily. 90 tablet 3  . triamcinolone (KENALOG) 0.1 % Apply 1 application topically 2 (two) times daily. 30 g 0  . ULTICARE TUBERCULIN SAFETY SYR 25G X 1" 1 ML MISC USE TO ADMINISTER INJECTABLE VITAMIN B12. 1 each 11   No current facility-administered medications for this visit.   Allergies:  Bee venom, Codeine, Penicillins, Bupropion, and Sudafed [pseudoephedrine hcl]   ROS: Reports trouble with intermittent panic attacks.  Physical Exam: VS:  BP (!) 160/82   Pulse 82   Resp 16   Ht '5\' 5"'  (1.651 m)   Wt 145 lb (65.8 kg)   SpO2 96%   BMI 24.13 kg/m , BMI Body mass index is 24.13 kg/m.  Wt Readings from Last 3 Encounters:  09/10/20 145 lb (65.8 kg)  09/07/20 149 lb (67.6 kg)  09/02/20 145 lb 12.8 oz (66.1 kg)    General: Patient appears comfortable at rest. HEENT: Conjunctiva and lids normal, wearing a mask. Neck: Supple, no elevated JVP or carotid bruits, no thyromegaly. Lungs:  Clear to auscultation, nonlabored breathing at rest. Cardiac: Regular rate and rhythm, no S3 or significant systolic murmur. Extremities: No pitting edema.  ECG:  An ECG dated 03/25/2020 was personally reviewed today and demonstrated:  Sinus rhythm with anteroseptal Q waves, nonspecific ST changes.  Recent Labwork: 09/01/2020: ALT 17; AST 12; BUN 7; Creatinine, Ser 0.55; Hemoglobin 15.5; Platelets 370; Potassium 4.4; Sodium 141     Component Value Date/Time   CHOL 202 (H) 09/01/2020 1510   TRIG 335 (H) 09/01/2020 1510   HDL 31 (L) 09/01/2020 1510   CHOLHDL 6.5 (H) 09/01/2020 1510   CHOLHDL 4.4 09/28/2017 1201   VLDL NOT CALC 03/30/2017 1520   LDLCALC 113 (H) 09/01/2020 1510   [place] 88 09/28/2017 1201    Other Studies Reviewed Today:  Echocardiogram 03/20/2018: Study Conclusions  - Left ventricle: The cavity size was normal. Wall thickness was increased in a pattern of mild LVH. Systolic function was vigorous. The estimated ejection fraction was in the range of 65% to 70%. Wall motion was normal; there were no regional wall motion abnormalities. Doppler parameters are consistent with abnormal left ventricular relaxation (grade 1 diastolic dysfunction). Doppler parameters are consistent with indeterminate ventricular filling pressure. - Mitral valve: Mildly thickened leaflets .  Impressions:  - LV systolic function is vigorous with grade 1 diastolic dysfuntion. No significant changes when compared to study dated 08/13/2017.  Assessment and Plan:  1.  History of nonischemic cardiomyopathy with normalization of LVEF, last assessed in 2019.  Follow-up echocardiogram will be obtained to ensure stability.  She continues on Zebeta, Cozaar, and has had recent addition of Aldactone mainly for additional blood pressure control.  2.  History of PSVT, currently quiescent.  She is on beta-blocker in the form of Zebeta.  Medication Adjustments/Labs and Tests  Ordered: Current medicines are reviewed at length with the patient today.  Concerns regarding medicines are outlined above.   Tests Ordered: Orders Placed This Encounter  Procedures  . ECHOCARDIOGRAM COMPLETE    Medication Changes: No orders of the defined types were placed in this encounter.   Disposition:  Follow up 1 year in the [place] office.  Signed, Satira Sark, MD, [facility] 09/10/2020 4:20 PM    [facility] [place], [place], [place] [address] Phone: [phone]; Fax: [phone]

## 2020-09-10 NOTE — Patient Instructions (Addendum)
Medication Instructions:   Your physician recommends that you continue on your current medications as directed. Please refer to the Current Medication list given to you today.  Labwork:  None  Testing/Procedures: Your physician has requested that you have an echocardiogram. Echocardiography is a painless test that uses sound waves to create images of your heart. It provides your doctor with information about the size and shape of your heart and how well your heart's chambers and valves are working. This procedure takes approximately one hour. There are no restrictions for this procedure.  Follow-Up:  Your physician recommends that you schedule a follow-up appointment in: 1 year. You will receive a reminder letter in the mail in about 10 months reminding you to call and schedule your appointment. If you don't receive this letter, please contact our office.  Any Other Special Instructions Will Be Listed Below (If Applicable).  If you need a refill on your cardiac medications before your next appointment, please call your pharmacy. 

## 2020-09-14 ENCOUNTER — Encounter: Payer: Self-pay | Admitting: Registered Nurse

## 2020-09-14 ENCOUNTER — Ambulatory Visit (INDEPENDENT_AMBULATORY_CARE_PROVIDER_SITE_OTHER): Payer: PPO | Admitting: Registered Nurse

## 2020-09-14 ENCOUNTER — Other Ambulatory Visit: Payer: Self-pay

## 2020-09-14 ENCOUNTER — Telehealth: Payer: Self-pay | Admitting: Registered Nurse

## 2020-09-14 VITALS — BP 187/70 | HR 77 | Temp 97.8°F | Resp 18 | Ht 65.0 in | Wt 145.4 lb

## 2020-09-14 DIAGNOSIS — F411 Generalized anxiety disorder: Secondary | ICD-10-CM

## 2020-09-14 MED ORDER — FLUOXETINE HCL 10 MG PO CAPS: 10.0000 mg | ORAL_CAPSULE | Freq: Every day | ORAL | 0 refills | Status: DC

## 2020-09-14 MED ORDER — ALPRAZOLAM 0.5 MG PO TBDP: 0.5000 mg | ORAL_TABLET | Freq: Two times a day (BID) | ORAL | 0 refills | Status: DC | PRN

## 2020-09-14 NOTE — Patient Instructions (Signed)
° ° ° °  If you have lab work done today you will be contacted with your lab results within the next 2 weeks.  If you have not heard from us then please contact us. The fastest way to get your results is to register for My Chart. ° ° °IF you received an x-ray today, you will receive an invoice from [facility]. Please contact [facility] at [phone] with questions or concerns regarding your invoice.  ° °IF you received labwork today, you will receive an invoice from [facility]. Please contact [facility] at [phone] with questions or concerns regarding your invoice.  ° °Our billing staff will not be able to assist you with questions regarding bills from these companies. ° °You will be contacted with the lab results as soon as they are available. The fastest way to get your results is to activate your My Chart account. Instructions are located on the last page of this paperwork. If you have not heard from us regarding the results in 2 weeks, please contact this office. °  ° ° ° °

## 2020-09-14 NOTE — Telephone Encounter (Signed)
Spoke with nurse Jerene Pitch. Provider will call in prescription shortly.

## 2020-09-14 NOTE — Telephone Encounter (Signed)
Pharmacy is calling  regarding   The following script  ALPRAZolam (NIRAVAM) 0.5 MG dissolvable tablet [[id number]]   Not available through their wholesaler  Can this be changed to non dissolvable  [name], NP  [address] [place] [address]  Phone:  [phone]  Fax:  [phone]  DEA #:  [id number]  NPI:  [id number] Supervising Provider:   Wendie Agreste, MD  [address], [place] [address]  Phone:  [phone]  Fax:  [phone]  DEA #:  [id number]  NPI:  [id number]

## 2020-09-14 NOTE — Telephone Encounter (Signed)
Patient is at the pharmacy now. Prozac hasn't been called in (fluoxetine).   [facility]  [place], [place] [address]  Phone:  [phone] Fax:  [phone]  DEA #:  --  DAW Reason: --   Patient waiting for it to be called in. Please advise at [phone]

## 2020-09-15 ENCOUNTER — Encounter: Payer: Self-pay | Admitting: Registered Nurse

## 2020-09-15 ENCOUNTER — Other Ambulatory Visit: Payer: Self-pay | Admitting: Registered Nurse

## 2020-09-15 DIAGNOSIS — F411 Generalized anxiety disorder: Secondary | ICD-10-CM

## 2020-09-15 MED ORDER — ALPRAZOLAM 0.5 MG PO TABS: 0.5000 mg | ORAL_TABLET | Freq: Every evening | ORAL | 0 refills | Status: DC | PRN

## 2020-09-15 NOTE — Telephone Encounter (Signed)
Called pharmacy and they have informed me that the pt has picked up 3 pills and ordered the rest of them. Once the rest comes in the pharmacy will give to the pt.

## 2020-09-15 NOTE — Telephone Encounter (Signed)
Can the Alprazolam be send in as not dissolvable.?

## 2020-09-15 NOTE — Telephone Encounter (Signed)
Medication was sent to the pharmacy yesterday.

## 2020-09-15 NOTE — Progress Notes (Signed)
Established Patient Office Visit  Subjective:  [name], adult    DOB: [birth year]  Age: 64 y.o. MRN: [id number]  CC:  Chief Complaint  Patient presents with  . Anxiety    Patient states she is here for follow up on anxiety. Gad7=17 patient states she has no other concerns.    HPI Tammy Mcpherson presents for anxiety  Ongoing. Sometimes interferes with sleep. Finds that at least once weekly, if not 3-4 times, she feels too wound up to sleep, too anxious, too energetic.  Has had lab workup for this in the past Has had diarrhea with his - GI suspects anxiety No notes from endocrinology on potential cause  Pt is interested in PRN and daily SSRI. Denies HI/SI  Past Medical History:  Diagnosis Date  . Anemia 04/30/2016  . Anxiety   . Blood transfusion without reported diagnosis   . Cardiomyopathy ([place])    a. EF 40-45% by echo in 04/2016 b. Improved to 60-65% by repeat imaging in 2018  . CHF (congestive heart failure) ([place])   . COPD (chronic obstructive pulmonary disease) ([place])   . Coronary artery calcification seen on CT scan   . Essential hypertension   . GERD (gastroesophageal reflux disease)   . History of bronchitis   . History of GI bleed   . Hyperlipidemia   . Iron deficiency anemia 05/12/2016   Bleeding ulcers  . Leukocytosis 03/23/2020  . Pollen allergy   . PSVT (paroxysmal supraventricular tachycardia) ([place])   . PVC's (premature ventricular contractions)   . Thrombocytosis 03/23/2020  . Vitamin B12 deficiency 03/23/2020    Past Surgical History:  Procedure Laterality Date  . ABDOMINAL HYSTERECTOMY     polyps  about age 3  . Barbie Banner OSTEOTOMY Right 07/10/2013   Procedure: Barbie Banner OSTEOTOMY RIGHT FOOT;  Surgeon: Marcheta Grammes, DPM;  Location: [facility];  Service: Orthopedics;  Laterality: Right;  . AMPUTATION Left 05/09/2017   Procedure: AMPUTATION 5TH TOE LEFT FOOT;  Surgeon: [name], DPM;  Location: [facility];  Service: Podiatry;   Laterality: Left;  . AMPUTATION Left 06/13/2017   Procedure: AMPUTATION 2ND TOE LEFT FOOT;  Surgeon: [name], DPM;  Location: [facility];  Service: Podiatry;  Laterality: Left;  . BUNIONECTOMY Right 07/10/2013   Procedure: VOGLER BUNIONECTOMY RIGHT FOOT;  Surgeon: Marcheta Grammes, DPM;  Location: [facility];  Service: Orthopedics;  Laterality: Right;  . CHOLECYSTECTOMY N/A 08/22/2017   Procedure: LAPAROSCOPIC CHOLECYSTECTOMY;  Surgeon: Virl Cagey, MD;  Location: [facility];  Service: General;  Laterality: N/A;  . COLONOSCOPY N/A 07/07/2016   Dr. Oneida Alar; redundant left colon, diverticulosis at hepatic flexure, non-bleeding internal hemorrhoids  . ESOPHAGOGASTRODUODENOSCOPY N/A 07/07/2016   Dr. Oneida Alar: many non-bleeding cratered gastric ulcers without stigmata of bleeding in gastric antrum. four 2-3 mm angioectasias without bleeding in duodenal bulb and second portion of duodenum s/p APC. Chroni gastritis on path.   . ESOPHAGOGASTRODUODENOSCOPY N/A 08/03/2017   Dr. Oneida Alar: erosive gastritis, AVMs. Found a single non-bleeding angioectasia in stomach, s/p APC therapy. Four non-bleeding angioectasias in duodenum s/p APC. Non-bleeding erosive gastropathy  . IR ANGIOGRAM EXTREMITY LEFT  04/24/2017  . IR FEM POP ART ATHERECT INC PTA MOD SED  04/24/2017  . IR INFUSION THROMBOL ARTERIAL INITIAL (MS)  04/24/2017  . IR RADIOLOGIST EVAL & MGMT  12/05/2016  . IR US GUIDE VASC ACCESS RIGHT  04/24/2017  . LIVER BIOPSY N/A 08/22/2017   Procedure: LIVER BIOPSY;  Surgeon: Virl Cagey,  MD;  Location: [facility];  Service: General;  Laterality: N/A;  . METATARSAL HEAD EXCISION Right 07/10/2013   Procedure: METATARSAL HEAD RESECTION OF DIGITS 2 AND 3 RIGHT FOOT;  Surgeon: Marcheta Grammes, DPM;  Location: [facility];  Service: Orthopedics;  Laterality: Right;  . PROXIMAL INTERPHALANGEAL FUSION (PIP) Right 07/10/2013   Procedure: ARTHRODESIS PIPJ  2ND DIGIT RIGHT FOOT;  Surgeon: Marcheta Grammes, DPM;   Location: [facility];  Service: Orthopedics;  Laterality: Right;    Family History  Adopted: Yes  Problem Relation Age of Onset  . Hypertension Father   . Coronary artery disease Sister   . Ulcers Maternal Grandmother   . Colon cancer Neg Hx        unknown, was adopted    Social History   Socioeconomic History  . Marital status: Married    Spouse name: [name]  . Number of children: 1  . Years of education: 18  . Highest education level: Not on file  Occupational History  . Occupation: disabled    Comment: heart  Tobacco Use  . Smoking status: Light Tobacco Smoker    Packs/day: 0.25    Years: 44.00    Pack years: 11.00    Types: Cigarettes    Start date: 05/10/1975  . Smokeless tobacco: Never Used  . Tobacco comment: 2 cigarettes per day 12/10/2019  Vaping Use  . Vaping Use: Former  Substance and Sexual Activity  . Alcohol use: Not Currently    Alcohol/week: 2.0 standard drinks    Types: 2 Glasses of wine per week  . Drug use: No  . Sexual activity: Yes    Birth control/protection: Surgical  Other Topics Concern  . Not on file  Social History Narrative   Disabled   Lives with husband [name]   Two dogs   Social Determinants of Health   Financial Resource Strain: Not on file  Food Insecurity: Not on file  Transportation Needs: Not on file  Physical Activity: Not on file  Stress: Not on file  Social Connections: Not on file  Intimate Partner Violence: Not on file    Outpatient Medications Prior to Visit  Medication Sig Dispense Refill  . acetaminophen (TYLENOL) 500 MG tablet Take 500 mg by mouth at bedtime as needed for moderate pain.     Marland Kitchen albuterol (PROVENTIL) (2.5 MG/3ML) 0.083% nebulizer solution Take 3 mLs (2.5 mg total) by nebulization every 4 (four) hours as needed for wheezing or shortness of breath. 75 mL 5  . aspirin 81 MG chewable tablet Chew 81 mg by mouth every morning.     Marland Kitchen atorvastatin (LIPITOR) 20 MG tablet TAKE ONE TABLET (20MG TOTAL) BY MOUTH DAILY  90 tablet 3  . bisoprolol (ZEBETA) 10 MG tablet TAKE ONE (1) TABLET BY MOUTH EVERY DAY 90 tablet 3  . Budeson-Glycopyrrol-Formoterol (BREZTRI AEROSPHERE) 160-9-4.8 MCG/ACT AERO Inhale 2 puffs into the lungs 2 (two) times daily. 11.8 g 0  . Cholecalciferol (VITAMIN D) 50 MCG (2000 UT) CAPS Take by mouth daily.    . Continuous Blood Gluc Receiver (FREESTYLE LIBRE 14 DAY READER) DEVI See admin instructions. 6 each 1  . Continuous Blood Gluc Sensor (FREESTYLE LIBRE 14 DAY SENSOR) MISC Inject 1 each into the skin See admin instructions. 6 each 1  . cyanocobalamin (,VITAMIN B-12,) 1000 MCG/ML injection Inject 1 mL (1,000 mcg total) into the muscle every 30 (thirty) days. 1 mL 11  . dicyclomine (BENTYL) 10 MG capsule Take 1 capsule (10 mg total) by  mouth as needed for spasms (30 mins before meals and at bedtime as needed for diarrhea and abdominal cramps.). Take no more than four times daily but take only as needed 120 capsule 1  . empagliflozin (JARDIANCE) 10 MG TABS tablet Take 10 mg by mouth daily before breakfast. 30 tablet 4  . gabapentin (NEURONTIN) 300 MG capsule TAKE ONE CAPSULE (300 MG TOTAL) BY MOUTHTWO TIMES DAILY. 180 capsule 1  . glucose blood (ONETOUCH ULTRA) test strip Use as instructed 100 each 12  . glucose monitoring kit (FREESTYLE) monitoring kit Use to check blood sugar BID as directed 1 each 0  . lactase (LACTAID) 3000 units tablet Take by mouth as needed.    . Lancets (ONETOUCH DELICA PLUS [id number]) MISC 1 each by Does not apply route 2 (two) times a day. 100 each 3  . losartan (COZAAR) 100 MG tablet TAKE ONE (1) TABLET BY MOUTH EVERY DAY 90 tablet 3  . metFORMIN (GLUCOPHAGE) 1000 MG tablet Take 1 tablet (1,000 mg total) by mouth 2 (two) times daily with a meal. (Patient taking differently: Take 500 mg by mouth 2 (two) times daily with a meal. Takes 500 mg twice daily.  Total of 1000 mg daily.) 180 tablet 3  . Misc. Devices MISC Please provide supplies (needle, syringe, alcohol swabs)  needed for patient to self-administer B-12 injections monthly. 1 each 12  . pantoprazole (PROTONIX) 40 MG tablet TAKE ONE (1) TABLET 30 MINS BEFORE YOUR FIRST MEAL. 90 tablet 3  . spironolactone (ALDACTONE) 25 MG tablet Take 1 tablet (25 mg total) by mouth daily. 90 tablet 3  . triamcinolone (KENALOG) 0.1 % Apply 1 application topically 2 (two) times daily. 30 g 0  . ULTICARE TUBERCULIN SAFETY SYR 25G X 1" 1 ML MISC USE TO ADMINISTER INJECTABLE VITAMIN B12. 1 each 11   No facility-administered medications prior to visit.    Allergies  Allergen Reactions  . Bee Venom Swelling  . Codeine Anaphylaxis    Tongue swelled  . Penicillins Swelling and Rash    Has patient had a PCN reaction causing immediate rash, facial/tongue/throat swelling, SOB or lightheadedness with hypotension: No, delayed Has patient had a PCN reaction causing severe rash involving mucus membranes or skin necrosis: No Has patient had a PCN reaction that required hospitalization No Has patient had a PCN reaction occurring within the last 10 years: No If all of the above answers are "NO", then may proceed with Cephalosporin use.   . Bupropion Other (See Comments)    "Made my skin crawl"  . Sudafed [Pseudoephedrine Hcl] Rash    ROS Review of Systems  Constitutional: Negative.   HENT: Negative.   Eyes: Negative.   Respiratory: Negative.   Cardiovascular: Negative.   Gastrointestinal: Negative.   Genitourinary: Negative.   Musculoskeletal: Negative.   Skin: Negative.   Neurological: Negative.   Psychiatric/Behavioral: Negative.   All other systems reviewed and are negative.     Objective:    Physical Exam Constitutional:      General: She is not in acute distress.    Appearance: Normal appearance. She is normal weight. She is not ill-appearing, toxic-appearing or diaphoretic.  Cardiovascular:     Rate and Rhythm: Normal rate and regular rhythm.     Heart sounds: Normal heart sounds. No murmur heard. No  friction rub. No gallop.   Pulmonary:     Effort: Pulmonary effort is normal. No respiratory distress.     Breath sounds: Normal breath sounds. No stridor. No  wheezing, rhonchi or rales.  Chest:     Chest wall: No tenderness.  Neurological:     General: No focal deficit present.     Mental Status: She is alert and oriented to person, place, and time. Mental status is at baseline.  Psychiatric:        Mood and Affect: Mood normal.        Behavior: Behavior normal.        Thought Content: Thought content normal.        Judgment: Judgment normal.     BP (!) 187/70   Pulse 77   Temp 97.8 F (36.6 C) (Temporal)   Resp 18   Ht '5\' 5"'  (1.651 m)   Wt 145 lb 6.4 oz (66 kg)   SpO2 94%   BMI 24.20 kg/m  Wt Readings from Last 3 Encounters:  09/14/20 145 lb 6.4 oz (66 kg)  09/10/20 145 lb (65.8 kg)  09/07/20 149 lb (67.6 kg)     Health Maintenance Due  Topic Date Due  . OPHTHALMOLOGY EXAM  Never done  . COVID-19 Vaccine (1) Never done  . PAP SMEAR-Modifier  Never done    There are no preventive care reminders to display for this patient.  Lab Results  Component Value Date   TSH 1.727 04/30/2016   Lab Results  Component Value Date   WBC 12.3 (H) 09/01/2020   HGB 15.5 09/01/2020   HCT 45.1 09/01/2020   MCV 94 09/01/2020   PLT 370 09/01/2020   Lab Results  Component Value Date   NA 141 09/01/2020   K 4.4 09/01/2020   CO2 23 09/01/2020   GLUCOSE 177 (H) 09/01/2020   BUN 7 (L) 09/01/2020   CREATININE 0.55 (L) 09/01/2020   BILITOT 0.3 09/01/2020   ALKPHOS 126 (H) 09/01/2020   AST 12 09/01/2020   ALT 17 09/01/2020   PROT 7.1 09/01/2020   ALBUMIN 4.2 09/01/2020   CALCIUM 10.0 09/01/2020   ANIONGAP 11 05/19/2020   Lab Results  Component Value Date   CHOL 202 (H) 09/01/2020   Lab Results  Component Value Date   HDL 31 (L) 09/01/2020   Lab Results  Component Value Date   LDLCALC 113 (H) 09/01/2020   Lab Results  Component Value Date   TRIG 335 (H)  09/01/2020   Lab Results  Component Value Date   CHOLHDL 6.5 (H) 09/01/2020   Lab Results  Component Value Date   HGBA1C 8.6 (A) 09/01/2020      Assessment & Plan:   Problem List Items Addressed This Visit   None   Visit Diagnoses    GAD (generalized anxiety disorder)    -  Primary   Relevant Medications   FLUoxetine (PROZAC) 10 MG capsule   ALPRAZolam (NIRAVAM) 0.5 MG dissolvable tablet      Meds ordered this encounter  Medications  . FLUoxetine (PROZAC) 10 MG capsule    Sig: Take 1 capsule (10 mg total) by mouth daily. After 2 weeks, increase to 2 capsules (35m total) by mouth daily.    Dispense:  90 capsule    Refill:  0    Order Specific Question:   Supervising Provider    Answer:   GCarlota Raspberry JEFFREY R [[id number]]  . ALPRAZolam (NIRAVAM) 0.5 MG dissolvable tablet    Sig: Take 1 tablet (0.5 mg total) by mouth 2 (two) times daily as needed for anxiety.    Dispense:  30 tablet    Refill:  0  Order Specific Question:   Supervising Provider    Answer:   Carlota Raspberry, JEFFREY R [[id number]]    Follow-up: No follow-ups on file.   PLAN  Will start on low dose fluoxetine with option to titrate up to 57m after 2 weeks. Med check in 5-6 weeks. Discussed that this may have GI side effects when starting but should resolve. Discussed other r/b/se of this medication  PRN alprazolam 0.518mqhs - for nights when sleep is difficult to achieve. Discussed risks surrounding benzodiazepines, particularly in patients with existing fall risks. Pt demonstrates understanding and intends to only use as prn, hopefully short term. PDMP consulted.  Patient encouraged to call clinic with any questions, comments, or concerns.  RiMaximiano CossNP

## 2020-09-20 ENCOUNTER — Other Ambulatory Visit: Payer: Self-pay | Admitting: Cardiology

## 2020-09-20 DIAGNOSIS — I429 Cardiomyopathy, unspecified: Secondary | ICD-10-CM

## 2020-09-27 ENCOUNTER — Other Ambulatory Visit (HOSPITAL_COMMUNITY): Payer: Self-pay | Admitting: Surgery

## 2020-09-27 ENCOUNTER — Other Ambulatory Visit (HOSPITAL_COMMUNITY): Payer: PPO

## 2020-09-27 DIAGNOSIS — D500 Iron deficiency anemia secondary to blood loss (chronic): Secondary | ICD-10-CM

## 2020-09-28 ENCOUNTER — Other Ambulatory Visit (HOSPITAL_COMMUNITY): Payer: PPO

## 2020-09-28 ENCOUNTER — Other Ambulatory Visit: Payer: Self-pay

## 2020-09-28 ENCOUNTER — Encounter: Payer: Self-pay | Admitting: Registered Nurse

## 2020-09-28 ENCOUNTER — Inpatient Hospital Stay (HOSPITAL_COMMUNITY): Payer: PPO | Attending: Hematology

## 2020-09-28 DIAGNOSIS — D500 Iron deficiency anemia secondary to blood loss (chronic): Secondary | ICD-10-CM | POA: Diagnosis not present

## 2020-09-28 DIAGNOSIS — E1165 Type 2 diabetes mellitus with hyperglycemia: Secondary | ICD-10-CM

## 2020-09-28 LAB — CBC WITH DIFFERENTIAL/PLATELET
Abs Immature Granulocytes: 0.11 10*3/uL — ABNORMAL HIGH (ref 0.00–0.07)
Basophils Absolute: 0.1 10*3/uL (ref 0.0–0.1)
Basophils Relative: 1 %
Eosinophils Absolute: 0.2 10*3/uL (ref 0.0–0.5)
Eosinophils Relative: 1 %
HCT: 45.3 % (ref 36.0–46.0)
Hemoglobin: 14.7 g/dL (ref 12.0–15.0)
Immature Granulocytes: 1 %
Lymphocytes Relative: 15 %
Lymphs Abs: 2.3 10*3/uL (ref 0.7–4.0)
MCH: 32.1 pg (ref 26.0–34.0)
MCHC: 32.5 g/dL (ref 30.0–36.0)
MCV: 98.9 fL (ref 80.0–100.0)
Monocytes Absolute: 1.7 10*3/uL — ABNORMAL HIGH (ref 0.1–1.0)
Monocytes Relative: 12 %
Neutro Abs: 10.6 10*3/uL — ABNORMAL HIGH (ref 1.7–7.7)
Neutrophils Relative %: 70 %
Platelets: 365 10*3/uL (ref 150–400)
RBC: 4.58 MIL/uL (ref 3.87–5.11)
RDW: 13.1 % (ref 11.5–15.5)
WBC: 15 10*3/uL — ABNORMAL HIGH (ref 4.0–10.5)
nRBC: 0 % (ref 0.0–0.2)

## 2020-09-28 LAB — COMPREHENSIVE METABOLIC PANEL
ALT: 18 U/L (ref 0–44)
AST: 15 U/L (ref 15–41)
Albumin: 3.9 g/dL (ref 3.5–5.0)
Alkaline Phosphatase: 98 U/L (ref 38–126)
Anion gap: 9 (ref 5–15)
BUN: 11 mg/dL (ref 8–23)
CO2: 24 mmol/L (ref 22–32)
Calcium: 9.3 mg/dL (ref 8.9–10.3)
Chloride: 102 mmol/L (ref 98–111)
Creatinine, Ser: 0.59 mg/dL (ref 0.44–1.00)
GFR, Estimated: >60 mL/min (ref >60)
Glucose, Bld: 223 mg/dL — ABNORMAL HIGH (ref 70–99)
Potassium: 3.7 mmol/L (ref 3.5–5.1)
Sodium: 135 mmol/L (ref 135–145)
Total Bilirubin: 0.6 mg/dL (ref 0.3–1.2)
Total Protein: 7.3 g/dL (ref 6.5–8.1)

## 2020-09-28 LAB — IRON AND TIBC
Iron: 100 ug/dL (ref 28–170)
Saturation Ratios: 28 % (ref 10.4–31.8)
TIBC: 360 ug/dL (ref 250–450)
UIBC: 260 ug/dL

## 2020-09-28 LAB — LACTATE DEHYDROGENASE: LDH: 103 U/L (ref 98–192)

## 2020-09-28 LAB — FERRITIN: Ferritin: 266 ng/mL (ref 11–307)

## 2020-09-28 LAB — VITAMIN B12: Vitamin B-12: 381 pg/mL (ref 180–914)

## 2020-09-28 MED ORDER — FREESTYLE LIBRE 14 DAY READER DEVI: 0 refills | Status: DC

## 2020-09-28 MED ORDER — FREESTYLE LIBRE 14 DAY SENSOR MISC: 1.0000 | 0 refills | Status: DC

## 2020-09-29 ENCOUNTER — Other Ambulatory Visit: Payer: Self-pay | Admitting: Registered Nurse

## 2020-09-29 DIAGNOSIS — E1165 Type 2 diabetes mellitus with hyperglycemia: Secondary | ICD-10-CM

## 2020-09-29 MED ORDER — FREESTYLE LIBRE 2 SENSOR MISC: 1.0000 | Freq: Every day | 0 refills | Status: DC

## 2020-09-30 LAB — VITAMIN D 25 HYDROXY (VIT D DEFICIENCY, FRACTURES): Vit D, 25-Hydroxy: 26.1 ng/mL — ABNORMAL LOW (ref 30–100)

## 2020-10-04 ENCOUNTER — Ambulatory Visit (HOSPITAL_COMMUNITY): Payer: PPO | Admitting: Hematology

## 2020-10-04 ENCOUNTER — Other Ambulatory Visit: Payer: Self-pay | Admitting: Cardiology

## 2020-10-05 ENCOUNTER — Inpatient Hospital Stay (HOSPITAL_COMMUNITY): Payer: PPO | Attending: Hematology | Admitting: Hematology

## 2020-10-05 ENCOUNTER — Other Ambulatory Visit: Payer: Self-pay

## 2020-10-05 VITALS — BP 190/68 | HR 85 | Temp 97.2°F | Resp 18 | Wt 150.4 lb

## 2020-10-05 DIAGNOSIS — G479 Sleep disorder, unspecified: Secondary | ICD-10-CM | POA: Diagnosis not present

## 2020-10-05 DIAGNOSIS — D500 Iron deficiency anemia secondary to blood loss (chronic): Secondary | ICD-10-CM | POA: Insufficient documentation

## 2020-10-05 DIAGNOSIS — D75839 Thrombocytosis, unspecified: Secondary | ICD-10-CM | POA: Insufficient documentation

## 2020-10-05 DIAGNOSIS — D72829 Elevated white blood cell count, unspecified: Secondary | ICD-10-CM | POA: Insufficient documentation

## 2020-10-05 DIAGNOSIS — E538 Deficiency of other specified B group vitamins: Secondary | ICD-10-CM | POA: Diagnosis not present

## 2020-10-05 DIAGNOSIS — Z79899 Other long term (current) drug therapy: Secondary | ICD-10-CM | POA: Insufficient documentation

## 2020-10-05 DIAGNOSIS — R5383 Other fatigue: Secondary | ICD-10-CM | POA: Diagnosis not present

## 2020-10-05 DIAGNOSIS — F17210 Nicotine dependence, cigarettes, uncomplicated: Secondary | ICD-10-CM | POA: Diagnosis not present

## 2020-10-05 DIAGNOSIS — R519 Headache, unspecified: Secondary | ICD-10-CM | POA: Insufficient documentation

## 2020-10-05 DIAGNOSIS — Z8249 Family history of ischemic heart disease and other diseases of the circulatory system: Secondary | ICD-10-CM | POA: Diagnosis not present

## 2020-10-05 DIAGNOSIS — I10 Essential (primary) hypertension: Secondary | ICD-10-CM | POA: Diagnosis not present

## 2020-10-05 DIAGNOSIS — K922 Gastrointestinal hemorrhage, unspecified: Secondary | ICD-10-CM | POA: Insufficient documentation

## 2020-10-05 NOTE — Patient Instructions (Signed)
[facility] at [facility] Discharge Instructions  You were seen today by Dr. Ellin Saba. He went over your recent results. Start taking 2,000 units of vitamin D daily. Dr. Ellin Saba will see you back in 4 months for labs and follow up.   Thank you for choosing [facility] at [facility] to provide your oncology and hematology care.  To afford each patient quality time with our provider, please arrive at least 15 minutes before your scheduled appointment time.   If you have a lab appointment with the Cancer Center please come in thru the Main Entrance and check in at the main information desk  You need to re-schedule your appointment should you arrive 10 or more minutes late.  We strive to give you quality time with our providers, and arriving late affects you and other patients whose appointments are after yours.  Also, if you no show three or more times for appointments you may be dismissed from the clinic at the providers discretion.     Again, thank you for choosing [facility].  Our hope is that these requests will decrease the amount of time that you wait before being seen by our physicians.       _____________________________________________________________  Should you have questions after your visit to [facility], please contact our office at [phone] between the hours of 8:00 a.m. and 4:30 p.m.  Voicemails left after 4:00 p.m. will not be returned until the following business day.  For prescription refill requests, have your pharmacy contact our office and allow 72 hours.    Cancer Center Support Programs:   > Cancer Support Group  2nd Tuesday of the month 1pm-2pm, Journey Room

## 2020-10-05 NOTE — Progress Notes (Signed)
[place],  [address]   CLINIC:  Medical Oncology/Hematology  PCP:  Tammy Coss, NP [address] / [place] [address]  [phone]  REASON FOR VISIT:  Follow-up for IDA  PRIOR THERAPY: None  CURRENT THERAPY: Intermittent Feraheme last on 07/07/2020  INTERVAL HISTORY:  [name], a 65 y.o. adult, returns for routine follow-up for her IDA. Tammy Mcpherson was last seen contacted via telephone on 12/16/2019.  Today she reports feeling well. She denies having any hematochezia or melena. She felt better after receiving her last Feraheme, though she has been feeling more sluggish and sleepy 3 days of the week. She continues taking 1,000 units of vitamin D daily, as well as ASA and Plavix.   REVIEW OF SYSTEMS:  Review of Systems  Constitutional: Positive for appetite change (50%) and fatigue (25%).  Neurological: Positive for headaches.  Psychiatric/Behavioral: Positive for sleep disturbance. The patient is nervous/anxious.   All other systems reviewed and are negative.   PAST MEDICAL/SURGICAL HISTORY:  Past Medical History:  Diagnosis Date  . Anemia 04/30/2016  . Anxiety   . Blood transfusion without reported diagnosis   . Cardiomyopathy ([facility])    a. EF 40-45% by echo in 04/2016 b. Improved to 60-65% by repeat imaging in 2018  . CHF (congestive heart failure) ([facility])   . COPD (chronic obstructive pulmonary disease) ([facility])   . Coronary artery calcification seen on CT scan   . Essential hypertension   . GERD (gastroesophageal reflux disease)   . History of bronchitis   . History of GI bleed   . Hyperlipidemia   . Iron deficiency anemia 05/12/2016   Bleeding ulcers  . Leukocytosis 03/23/2020  . Pollen allergy   . PSVT (paroxysmal supraventricular tachycardia) ([facility])   . PVC's (premature ventricular contractions)   . Thrombocytosis 03/23/2020  . Vitamin B12 deficiency 03/23/2020   Past Surgical History:  Procedure Laterality  Date  . ABDOMINAL HYSTERECTOMY     polyps  about age 17  . Barbie Banner OSTEOTOMY Right 07/10/2013   Procedure: Barbie Banner OSTEOTOMY RIGHT FOOT;  Surgeon: Marcheta Grammes, DPM;  Location: [facility];  Service: Orthopedics;  Laterality: Right;  . AMPUTATION Left 05/09/2017   Procedure: AMPUTATION 5TH TOE LEFT FOOT;  Surgeon: [name], DPM;  Location: [facility];  Service: Podiatry;  Laterality: Left;  . AMPUTATION Left 06/13/2017   Procedure: AMPUTATION 2ND TOE LEFT FOOT;  Surgeon: [name], DPM;  Location: [facility];  Service: Podiatry;  Laterality: Left;  . BUNIONECTOMY Right 07/10/2013   Procedure: VOGLER BUNIONECTOMY RIGHT FOOT;  Surgeon: Marcheta Grammes, DPM;  Location: [facility];  Service: Orthopedics;  Laterality: Right;  . CHOLECYSTECTOMY N/A 08/22/2017   Procedure: LAPAROSCOPIC CHOLECYSTECTOMY;  Surgeon: Virl Cagey, MD;  Location: [facility];  Service: General;  Laterality: N/A;  . COLONOSCOPY N/A 07/07/2016   Dr. Oneida Alar; redundant left colon, diverticulosis at hepatic flexure, non-bleeding internal hemorrhoids  . ESOPHAGOGASTRODUODENOSCOPY N/A 07/07/2016   Dr. Oneida Alar: many non-bleeding cratered gastric ulcers without stigmata of bleeding in gastric antrum. four 2-3 mm angioectasias without bleeding in duodenal bulb and second portion of duodenum s/p APC. Chroni gastritis on path.   . ESOPHAGOGASTRODUODENOSCOPY N/A 08/03/2017   Dr. Oneida Alar: erosive gastritis, AVMs. Found a single non-bleeding angioectasia in stomach, s/p APC therapy. Four non-bleeding angioectasias in duodenum s/p APC. Non-bleeding erosive gastropathy  . IR ANGIOGRAM EXTREMITY LEFT  04/24/2017  . IR FEM POP ART ATHERECT INC PTA MOD SED  04/24/2017  . IR INFUSION THROMBOL ARTERIAL INITIAL (MS)  04/24/2017  . IR RADIOLOGIST EVAL & MGMT  12/05/2016  . IR US GUIDE VASC ACCESS RIGHT  04/24/2017  . LIVER BIOPSY N/A 08/22/2017   Procedure: LIVER BIOPSY;  Surgeon: Virl Cagey, MD;  Location: [facility];  Service: General;   Laterality: N/A;  . METATARSAL HEAD EXCISION Right 07/10/2013   Procedure: METATARSAL HEAD RESECTION OF DIGITS 2 AND 3 RIGHT FOOT;  Surgeon: Marcheta Grammes, DPM;  Location: [facility];  Service: Orthopedics;  Laterality: Right;  . PROXIMAL INTERPHALANGEAL FUSION (PIP) Right 07/10/2013   Procedure: ARTHRODESIS PIPJ  2ND DIGIT RIGHT FOOT;  Surgeon: Marcheta Grammes, DPM;  Location: [facility];  Service: Orthopedics;  Laterality: Right;    SOCIAL HISTORY:  Social History   Socioeconomic History  . Marital status: Married    Spouse name: Tammy Mcpherson  . Number of children: 1  . Years of education: 36  . Highest education level: Not on file  Occupational History  . Occupation: disabled    Comment: heart  Tobacco Use  . Smoking status: Light Tobacco Smoker    Packs/day: 0.25    Years: 44.00    Pack years: 11.00    Types: Cigarettes    Start date: 05/10/1975  . Smokeless tobacco: Never Used  . Tobacco comment: 2 cigarettes per day 12/10/2019  Vaping Use  . Vaping Use: Former  Substance and Sexual Activity  . Alcohol use: Not Currently    Alcohol/week: 2.0 standard drinks    Types: 2 Glasses of wine per week  . Drug use: No  . Sexual activity: Yes    Birth control/protection: Surgical  Other Topics Concern  . Not on file  Social History Narrative   Disabled   Lives with husband Tammy Mcpherson   Two dogs   Social Determinants of Health   Financial Resource Strain: Not on file  Food Insecurity: Not on file  Transportation Needs: Not on file  Physical Activity: Not on file  Stress: Not on file  Social Connections: Not on file  Intimate Partner Violence: Not At Risk  . Fear of Current or Ex-Partner: No  . Emotionally Abused: No  . Physically Abused: No  . Sexually Abused: No    FAMILY HISTORY:  Family History  Adopted: Yes  Problem Relation Age of Onset  . Hypertension Father   . Coronary artery disease Sister   . Ulcers Maternal Grandmother   . Colon cancer Neg Hx         unknown, was adopted    CURRENT MEDICATIONS:  Current Outpatient Medications  Medication Sig Dispense Refill  . acetaminophen (TYLENOL) 500 MG tablet Take 500 mg by mouth at bedtime as needed for moderate pain.     Marland Kitchen albuterol (PROVENTIL) (2.5 MG/3ML) 0.083% nebulizer solution Take 3 mLs (2.5 mg total) by nebulization every 4 (four) hours as needed for wheezing or shortness of breath. 75 mL 5  . ALPRAZolam (XANAX) 0.5 MG tablet Take 1 tablet (0.5 mg total) by mouth at bedtime as needed for anxiety. 30 tablet 0  . aspirin 81 MG chewable tablet Chew 81 mg by mouth every morning.     Marland Kitchen atorvastatin (LIPITOR) 20 MG tablet TAKE ONE TABLET (20MG TOTAL) BY MOUTH DAILY 90 tablet 3  . bisoprolol (ZEBETA) 10 MG tablet TAKE ONE (1) TABLET BY MOUTH EVERY DAY 90 tablet 3  . Budeson-Glycopyrrol-Formoterol (BREZTRI AEROSPHERE) 160-9-4.8 MCG/ACT AERO Inhale 2 puffs into the lungs 2 (two) times  daily. 11.8 g 0  . Cholecalciferol (VITAMIN D) 50 MCG (2000 UT) CAPS Take by mouth daily.    . Continuous Blood Gluc Receiver (FREESTYLE LIBRE 14 DAY READER) DEVI See admin instructions. 4 each 0  . Continuous Blood Gluc Sensor (FREESTYLE LIBRE 14 DAY SENSOR) MISC Inject 1 each into the skin See admin instructions. 4 each 0  . Continuous Blood Gluc Sensor (FREESTYLE LIBRE 2 SENSOR) MISC 1 each by Does not apply route daily. 1 each 0  . cyanocobalamin (,VITAMIN B-12,) 1000 MCG/ML injection Inject 1 mL (1,000 mcg total) into the muscle every 30 (thirty) days. 1 mL 11  . dicyclomine (BENTYL) 10 MG capsule Take 1 capsule (10 mg total) by mouth as needed for spasms (30 mins before meals and at bedtime as needed for diarrhea and abdominal cramps.). Take no more than four times daily but take only as needed 120 capsule 1  . empagliflozin (JARDIANCE) 10 MG TABS tablet Take 10 mg by mouth daily before breakfast. 30 tablet 4  . FLUoxetine (PROZAC) 10 MG capsule Take 1 capsule (10 mg total) by mouth daily. After 2 weeks, increase to  2 capsules (71m total) by mouth daily. 90 capsule 0  . gabapentin (NEURONTIN) 300 MG capsule TAKE ONE CAPSULE (300 MG TOTAL) BY MOUTHTWO TIMES DAILY. 180 capsule 1  . glucose blood (ONETOUCH ULTRA) test strip Use as instructed 100 each 12  . glucose monitoring kit (FREESTYLE) monitoring kit Use to check blood sugar BID as directed 1 each 0  . lactase (LACTAID) 3000 units tablet Take by mouth as needed.    . Lancets (ONETOUCH DELICA PLUS [id number] MISC 1 each by Does not apply route 2 (two) times a day. 100 each 3  . losartan (COZAAR) 100 MG tablet TAKE ONE (1) TABLET BY MOUTH EVERY DAY 90 tablet 3  . metFORMIN (GLUCOPHAGE) 1000 MG tablet Take 1 tablet (1,000 mg total) by mouth 2 (two) times daily with a meal. (Patient taking differently: Take 500 mg by mouth 2 (two) times daily with a meal. Takes 500 mg twice daily.  Total of 1000 mg daily.) 180 tablet 3  . Misc. Devices MISC Please provide supplies (needle, syringe, alcohol swabs) needed for patient to self-administer B-12 injections monthly. 1 each 12  . pantoprazole (PROTONIX) 40 MG tablet TAKE ONE (1) TABLET 30 MINS BEFORE YOUR FIRST MEAL. 90 tablet 3  . spironolactone (ALDACTONE) 25 MG tablet Take 1 tablet (25 mg total) by mouth daily. 90 tablet 3  . triamcinolone (KENALOG) 0.1 % Apply 1 application topically 2 (two) times daily. 30 g 0  . ULTICARE TUBERCULIN SAFETY SYR 25G X 1" 1 ML MISC USE TO ADMINISTER INJECTABLE VITAMIN B12. 1 each 11   No current facility-administered medications for this visit.    ALLERGIES:  Allergies  Allergen Reactions  . Bee Venom Swelling  . Codeine Anaphylaxis    Tongue swelled  . Penicillins Swelling and Rash    Has patient had a PCN reaction causing immediate rash, facial/tongue/throat swelling, SOB or lightheadedness with hypotension: No, delayed Has patient had a PCN reaction causing severe rash involving mucus membranes or skin necrosis: No Has patient had a PCN reaction that required  hospitalization No Has patient had a PCN reaction occurring within the last 10 years: No If all of the above answers are "NO", then may proceed with Cephalosporin use.   . Bupropion Other (See Comments)    "Made my skin crawl"  . Sudafed [Pseudoephedrine Hcl] Rash  PHYSICAL EXAM:  Performance status (ECOG): 1 - Symptomatic but completely ambulatory  Vitals:   10/05/20 1446  BP: (!) 190/68  Pulse: 85  Resp: 18  Temp: (!) 97.2 F (36.2 C)  SpO2: 96%   Wt Readings from Last 3 Encounters:  10/05/20 150 lb 6.4 oz (68.2 kg)  09/14/20 145 lb 6.4 oz (66 kg)  09/10/20 145 lb (65.8 kg)   Physical Exam Vitals reviewed.  Constitutional:      Appearance: Normal appearance.  Neurological:     General: No focal deficit present.     Mental Status: She is alert and oriented to person, place, and time.  Psychiatric:        Mood and Affect: Mood normal.        Behavior: Behavior normal.     LABORATORY DATA:  I have reviewed the labs as listed.  CBC Latest Ref Rng & Units 09/28/2020 09/01/2020 06/17/2020  WBC 4.0 - 10.5 K/uL 15.0(H) 12.3(H) 15.3(H)  Hemoglobin 12.0 - 15.0 g/dL 14.7 15.5 14.2  Hematocrit 36.0 - 46.0 % 45.3 45.1 44.1  Platelets 150 - 400 K/uL 365 370 423(H)   CMP Latest Ref Rng & Units 09/28/2020 09/01/2020 05/19/2020  Glucose 70 - 99 mg/dL 223(H) 177(H) 254(H)  BUN 8 - 23 mg/dL 11 7(L) 14  Creatinine 0.44 - 1.00 mg/dL 0.59 0.55(L) 0.66  Sodium 135 - 145 mmol/L 135 141 136  Potassium 3.5 - 5.1 mmol/L 3.7 4.4 4.3  Chloride 98 - 111 mmol/L 102 104 103  CO2 22 - 32 mmol/L '24 23 22  ' Calcium 8.9 - 10.3 mg/dL 9.3 10.0 9.4  Total Protein 6.5 - 8.1 g/dL 7.3 7.1 -  Total Bilirubin 0.3 - 1.2 mg/dL 0.6 0.3 -  Alkaline Phos 38 - 126 U/L 98 126(H) -  AST 15 - 41 U/L 15 12 -  ALT 0 - 44 U/L 18 17 -      Component Value Date/Time   RBC 4.58 09/28/2020 0949   MCV 98.9 09/28/2020 0949   MCV 94 09/01/2020 1510   MCH 32.1 09/28/2020 0949   MCHC 32.5 09/28/2020 0949   RDW  13.1 09/28/2020 0949   RDW 12.8 09/01/2020 1510   LYMPHSABS 2.3 09/28/2020 0949   LYMPHSABS 1.8 07/02/2019 1539   MONOABS 1.7 (H) 09/28/2020 0949   EOSABS 0.2 09/28/2020 0949   EOSABS 0.1 07/02/2019 1539   BASOSABS 0.1 09/28/2020 0949   BASOSABS 0.1 07/02/2019 1539   Lab Results  Component Value Date   LDH 103 09/28/2020   LDH 108 12/09/2019   LDH 134 09/03/2019   Lab Results  Component Value Date   VD25OH 26.1 (L) 09/28/2020   VD25OH 28.98 (L) 12/09/2019   VD25OH 34.19 09/03/2019   Lab Results  Component Value Date   TIBC 360 09/28/2020   TIBC 393 06/17/2020   TIBC 351 05/06/2020   FERRITIN 266 09/28/2020   FERRITIN 117 06/17/2020   FERRITIN 204 05/06/2020   IRONPCTSAT 28 09/28/2020   IRONPCTSAT 20 06/17/2020   IRONPCTSAT 37 (H) 05/06/2020    DIAGNOSTIC IMAGING:  I have independently reviewed the scans and discussed with the patient. No results found.   ASSESSMENT:  1.  Iron deficiency anemia: -Thought to be from chronic GI blood loss from gastric and small bowel AVMs.  Patient on aspirin 81 mg.  Plavix discontinued in October 2019.  She had a history of multiple blood transfusions. -Last Feraheme infusion on 06/25/2020 on 07/07/2020.  2.  Leukocytosis and thrombocytosis: -Ahlgren  count on 12/09/2019 was 15.2.  Differential shows 74% neutrophils, 14% lymphocytes and 9% monocytes and 2% eosinophils. -Prior work-up for JAK2 V617F testing and BCR/ABL by FISH was negative. -Platelets are normalized to 391.  This is reactive from iron deficiency. -She is on steroid inhalers which could be contributing to leukocytosis.  3.  B12 deficiency: -She is on monthly B12 injections. -Her B12 on 12/09/2019 was 455.   PLAN:  1.  Iron deficiency anemia: -She has felt improvement in her energy levels after the last Feraheme infusion on 07/07/2020. -She has intermittent weakness 1 or 2 days/week. -Reviewed labs from 09/20/2020. Hemoglobin improved to 14.7. Ferritin is 266 and percent  saturation is 28. Vitamin B12 was normal. -Did not recommend any parenteral iron therapy. We will reevaluate her in 3 to 4 months with repeat labs. She was told to call us sooner should she develop any severe weakness for repeat labs.  2.  Leukocytosis and thrombocytosis: -She was tested for myeloproliferative disorders which was negative. -Artus count is remaining stable around 15 K with elevated neutrophils and monocytes. No B symptoms. No further testing needed.  3.  B12 deficiency: -B12 level was normal. Continue B12 supplements.   Orders placed this encounter:  No orders of the defined types were placed in this encounter.    Derek Jack, MD [place] [phone]   I, Milinda Antis, am acting as a scribe for Dr. Sanda Linger.  I, Derek Jack MD, have reviewed the above documentation for accuracy and completeness, and I agree with the above.

## 2020-10-06 ENCOUNTER — Ambulatory Visit (INDEPENDENT_AMBULATORY_CARE_PROVIDER_SITE_OTHER): Payer: PPO

## 2020-10-06 DIAGNOSIS — I429 Cardiomyopathy, unspecified: Secondary | ICD-10-CM | POA: Diagnosis not present

## 2020-10-06 LAB — ECHOCARDIOGRAM COMPLETE
Area-P 1/2: 3.93 cm2
Calc EF: 65.3 %
MV M vel: 1.75 m/s
MV Peak grad: 12.3 mmHg
S' Lateral: 3.06 cm
Single Plane A2C EF: 70.8 %
Single Plane A4C EF: 57 %

## 2020-10-07 ENCOUNTER — Encounter (HOSPITAL_COMMUNITY): Payer: Self-pay

## 2020-10-08 ENCOUNTER — Telehealth: Payer: Self-pay | Admitting: Cardiology

## 2020-10-08 NOTE — Telephone Encounter (Signed)
Calling for Echo results  

## 2020-10-08 NOTE — Telephone Encounter (Signed)
I spoke  with patient, echo results given, pcp copied

## 2020-10-11 ENCOUNTER — Encounter: Payer: Self-pay | Admitting: Registered Nurse

## 2020-10-12 ENCOUNTER — Encounter: Payer: Self-pay | Admitting: Registered Nurse

## 2020-10-15 ENCOUNTER — Encounter: Payer: Self-pay | Admitting: Registered Nurse

## 2020-10-18 ENCOUNTER — Ambulatory Visit: Payer: PPO | Admitting: Pulmonary Disease

## 2020-10-19 ENCOUNTER — Other Ambulatory Visit: Payer: Self-pay | Admitting: Registered Nurse

## 2020-10-19 ENCOUNTER — Other Ambulatory Visit: Payer: Self-pay | Admitting: Cardiology

## 2020-10-19 DIAGNOSIS — F411 Generalized anxiety disorder: Secondary | ICD-10-CM

## 2020-10-19 NOTE — Telephone Encounter (Signed)
Have sent a refill

## 2020-10-20 DIAGNOSIS — M549 Dorsalgia, unspecified: Secondary | ICD-10-CM | POA: Diagnosis not present

## 2020-10-21 ENCOUNTER — Ambulatory Visit: Payer: PPO | Admitting: Pulmonary Disease

## 2020-10-22 ENCOUNTER — Other Ambulatory Visit: Payer: Self-pay

## 2020-10-22 ENCOUNTER — Ambulatory Visit (INDEPENDENT_AMBULATORY_CARE_PROVIDER_SITE_OTHER): Payer: PPO | Admitting: Family Medicine

## 2020-10-22 ENCOUNTER — Encounter: Payer: Self-pay | Admitting: Family Medicine

## 2020-10-22 VITALS — BP 132/62 | HR 81 | Temp 96.9°F | Ht 65.0 in | Wt 144.0 lb

## 2020-10-22 DIAGNOSIS — M5442 Lumbago with sciatica, left side: Secondary | ICD-10-CM | POA: Diagnosis not present

## 2020-10-22 DIAGNOSIS — M549 Dorsalgia, unspecified: Secondary | ICD-10-CM

## 2020-10-22 DIAGNOSIS — M5441 Lumbago with sciatica, right side: Secondary | ICD-10-CM

## 2020-10-22 MED ORDER — CYCLOBENZAPRINE HCL 5 MG PO TABS: ORAL_TABLET | ORAL | 0 refills | Status: DC

## 2020-10-22 NOTE — Patient Instructions (Addendum)
Okay to continue Tylenol, do not exceed maximum dosing on label.  I did write muscle relaxant up to 3 times per day but be very careful with that medication as it can cause sedation or dizziness.  I did order x-rays of your mid and lower back, those can be performed at [facility] on [address] today.  You can show up for that imaging.  See other information below including ice or heat and other treatments you can do at home.  If back pain is not improving into next week or worsening sooner follow-up here, urgent care or emergency room.   Return to the clinic or go to the nearest emergency room if any of your symptoms worsen or new symptoms occur.   Acute Back Pain, Adult Acute back pain is sudden and usually short-lived. It is often caused by an injury to the muscles and tissues in the back. The injury may result from:  A muscle or ligament getting overstretched or torn (strained). Ligaments are tissues that connect bones to each other. Lifting something improperly can cause a back strain.  Wear and tear (degeneration) of the spinal disks. Spinal disks are circular tissue that provide cushioning between the bones of the spine (vertebrae).  Twisting motions, such as while playing sports or doing yard work.  A hit to the back.  Arthritis. You may have a physical exam, lab tests, and imaging tests to find the cause of your pain. Acute back pain usually goes away with rest and home care. Follow these instructions at home: Managing pain, stiffness, and swelling  Treatment may include medicines for pain and inflammation that are taken by mouth or applied to the skin, prescription pain medicine, or muscle relaxants. Take over-the-counter and prescription medicines only as told by your health care provider.  Your health care provider may recommend applying ice during the first 24-48 hours after your pain starts. To do this: ? Put ice in a plastic bag. ? Place a towel between your  skin and the bag. ? Leave the ice on for 20 minutes, 2-3 times a day.  If directed, apply heat to the affected area as often as told by your health care provider. Use the heat source that your health care provider recommends, such as a moist heat pack or a heating pad. ? Place a towel between your skin and the heat source. ? Leave the heat on for 20-30 minutes. ? Remove the heat if your skin turns bright red. This is especially important if you are unable to feel pain, heat, or cold. You have a greater risk of getting burned. Activity  Do not stay in bed. Staying in bed for more than 1-2 days can delay your recovery.  Sit up and stand up straight. Avoid leaning forward when you sit or hunching over when you stand. ? If you work at a desk, sit close to it so you do not need to lean over. Keep your chin tucked in. Keep your neck drawn back, and keep your elbows bent at a 90-degree angle (right angle). ? Sit high and close to the steering wheel when you drive. Add lower back (lumbar) support to your car seat, if needed.  Take short walks on even surfaces as soon as you are able. Try to increase the length of time you walk each day.  Do not sit, drive, or stand in one place for more than 30 minutes at a time. Sitting or standing for long periods  of time can put stress on your back.  Do not drive or use heavy machinery while taking prescription pain medicine.  Use proper lifting techniques. When you bend and lift, use positions that put less stress on your back: ? [place] your knees. ? Keep the load close to your body. ? Avoid twisting.  Exercise regularly as told by your health care provider. Exercising helps your back heal faster and helps prevent back injuries by keeping muscles strong and flexible.  Work with a physical therapist to make a safe exercise program, as recommended by your health care provider. Do any exercises as told by your physical therapist.   Lifestyle  Maintain a healthy  weight. Extra weight puts stress on your back and makes it difficult to have good posture.  Avoid activities or situations that make you feel anxious or stressed. Stress and anxiety increase muscle tension and can make back pain worse. Learn ways to manage anxiety and stress, such as through exercise. General instructions  Sleep on a firm mattress in a comfortable position. Try lying on your side with your knees slightly bent. If you lie on your back, put a pillow under your knees.  Follow your treatment plan as told by your health care provider. This may include: ? Cognitive or behavioral therapy. ? Acupuncture or massage therapy. ? Meditation or yoga. Contact a health care provider if:  You have pain that is not relieved with rest or medicine.  You have increasing pain going down into your legs or buttocks.  Your pain does not improve after 2 weeks.  You have pain at night.  You lose weight without trying.  You have a fever or chills. Get help right away if:  You develop new bowel or bladder control problems.  You have unusual weakness or numbness in your arms or legs.  You develop nausea or vomiting.  You develop abdominal pain.  You feel faint. Summary  Acute back pain is sudden and usually short-lived.  Use proper lifting techniques. When you bend and lift, use positions that put less stress on your back.  Take over-the-counter and prescription medicines and apply heat or ice as directed by your health care provider. This information is not intended to replace advice given to you by your health care provider. Make sure you discuss any questions you have with your health care provider. Document Revised: 06/11/2020 Document Reviewed: 06/11/2020 Elsevier Patient Education  2021 [employer].   If you have lab work done today you will be contacted with your lab results within the next 2 weeks.  If you have not heard from [place] then please contact us. The fastest way to  get your results is to register for My Chart.   IF you received an x-ray today, you will receive an invoice from [facility]. Please contact [facility] at [phone] with questions or concerns regarding your invoice.   IF you received labwork today, you will receive an invoice from [facility]. Please contact [facility] at [phone] with questions or concerns regarding your invoice.   Our billing staff will not be able to assist you with questions regarding bills from these companies.  You will be contacted with the lab results as soon as they are available. The fastest way to get your results is to activate your My Chart account. Instructions are located on the last page of this paperwork. If you have not heard from [place] regarding the results in 2 weeks, please contact this office.

## 2020-10-22 NOTE — Progress Notes (Signed)
Subjective:  [name], adult    DOB: Jul 14, 1956  Age: 65 y.o. MRN: [id number]  CC:  Chief Complaint  Patient presents with  . Back Pain    Going on 2 week this time and worse the last week. Wakes pt from sleep     HPI Tammy Mcpherson presents for   Back pain Past 2 weeks. No fall/injury. Getting out of bed. Upper to lower back pain. Pain goes down both legs, mostly at night. Pain on both sides of back. Feels like getting worse - more sore.  No new activities or exercises prior to onset. No interference with ADL;s but just slower.  No bowel or bladder incontinence, no saddle anesthesia, no lower extremity weakness.  No fever/night sweats or weight loss.  No prior back pain, surgeries, injuries.  No dysuria, abd pain, or new urinary symptoms.  No known hx of osteoporosis.   Tx: tylenol 3-5 times per day, min relief.  Unable to take nsaids with prior gastric ulcer.   History Patient Active Problem List   Diagnosis Date Noted  . Adrenal hyperplasia ([place]) 08/18/2020  . Neutrophilia 03/23/2020  . Thrombocytosis 03/23/2020  . Vitamin B12 deficiency 03/23/2020  . Type 2 diabetes mellitus with hyperglycemia, without long-term current use of insulin ([place]) 12/23/2019  . Diabetes mellitus ([place]) 12/23/2019  . Type 2 diabetes mellitus ([place]) 11/12/2019  . Healthcare maintenance 11/03/2019  . Ganglion cyst of volar aspect of right wrist 05/23/2019  . Neuropathy due to type 2 diabetes mellitus ([place]) 04/22/2019  . Need for hepatitis C screening test 11/20/2018  . Prediabetes 03/01/2018  . Diarrhea 02/07/2018  . Lesion of liver   . Gastric AVM   . AVM (arteriovenous malformation) of small bowel, acquired   . Vitamin D deficiency 01/15/2017  . PAD (peripheral artery disease) ([place]) 01/15/2017  . Aortic atherosclerosis ([place]) 01/01/2017  . Gastric ulcer 01/01/2017  . Hyperlipidemia 01/01/2017  . Dyspnea on effort 01/01/2017  . Anemia, iron deficiency 05/12/2016  .  Abdominal pain 05/12/2016  . LLQ abdominal pain 05/12/2016  . Nonischemic cardiomyopathy ([place]) 05/02/2016  . CAD-Ca++ coronaries on CTA 05/02/2016  . Acute combined systolic and diastolic heart failure ([place]) 04/30/2016  . Chronic obstructive pulmonary disease ([place]) 04/30/2016  . Essential hypertension 04/30/2016  . Tobacco abuse 04/30/2016   Past Medical History:  Diagnosis Date  . Anemia 04/30/2016  . Anxiety   . Blood transfusion without reported diagnosis   . Cardiomyopathy ([place])    a. EF 40-45% by echo in 04/2016 b. Improved to 60-65% by repeat imaging in 2018  . CHF (congestive heart failure) ([place])   . COPD (chronic obstructive pulmonary disease) ([place])   . Coronary artery calcification seen on CT scan   . Essential hypertension   . GERD (gastroesophageal reflux disease)   . History of bronchitis   . History of GI bleed   . Hyperlipidemia   . Iron deficiency anemia 05/12/2016   Bleeding ulcers  . Leukocytosis 03/23/2020  . Pollen allergy   . PSVT (paroxysmal supraventricular tachycardia) ([place])   . PVC's (premature ventricular contractions)   . Thrombocytosis 03/23/2020  . Vitamin B12 deficiency 03/23/2020   Past Surgical History:  Procedure Laterality Date  . ABDOMINAL HYSTERECTOMY     polyps  about age 23  . Barbie Banner OSTEOTOMY Right 07/10/2013   Procedure: Barbie Banner OSTEOTOMY RIGHT FOOT;  Surgeon: Marcheta Grammes, DPM;  Location: [facility];  Service: Orthopedics;  Laterality: Right;  . AMPUTATION  Left 05/09/2017   Procedure: AMPUTATION 5TH TOE LEFT FOOT;  Surgeon: [name], DPM;  Location: [facility];  Service: Podiatry;  Laterality: Left;  . AMPUTATION Left 06/13/2017   Procedure: AMPUTATION 2ND TOE LEFT FOOT;  Surgeon: [name], DPM;  Location: [facility];  Service: Podiatry;  Laterality: Left;  . BUNIONECTOMY Right 07/10/2013   Procedure: VOGLER BUNIONECTOMY RIGHT FOOT;  Surgeon: Marcheta Grammes, DPM;  Location: [facility];  Service: Orthopedics;  Laterality:  Right;  . CHOLECYSTECTOMY N/A 08/22/2017   Procedure: LAPAROSCOPIC CHOLECYSTECTOMY;  Surgeon: Virl Cagey, MD;  Location: [facility];  Service: General;  Laterality: N/A;  . COLONOSCOPY N/A 07/07/2016   Dr. Oneida Alar; redundant left colon, diverticulosis at hepatic flexure, non-bleeding internal hemorrhoids  . ESOPHAGOGASTRODUODENOSCOPY N/A 07/07/2016   Dr. Oneida Alar: many non-bleeding cratered gastric ulcers without stigmata of bleeding in gastric antrum. four 2-3 mm angioectasias without bleeding in duodenal bulb and second portion of duodenum s/p APC. Chroni gastritis on path.   . ESOPHAGOGASTRODUODENOSCOPY N/A 08/03/2017   Dr. Oneida Alar: erosive gastritis, AVMs. Found a single non-bleeding angioectasia in stomach, s/p APC therapy. Four non-bleeding angioectasias in duodenum s/p APC. Non-bleeding erosive gastropathy  . IR ANGIOGRAM EXTREMITY LEFT  04/24/2017  . IR FEM POP ART ATHERECT INC PTA MOD SED  04/24/2017  . IR INFUSION THROMBOL ARTERIAL INITIAL (MS)  04/24/2017  . IR RADIOLOGIST EVAL & MGMT  12/05/2016  . IR US GUIDE VASC ACCESS RIGHT  04/24/2017  . LIVER BIOPSY N/A 08/22/2017   Procedure: LIVER BIOPSY;  Surgeon: Virl Cagey, MD;  Location: [facility];  Service: General;  Laterality: N/A;  . METATARSAL HEAD EXCISION Right 07/10/2013   Procedure: METATARSAL HEAD RESECTION OF DIGITS 2 AND 3 RIGHT FOOT;  Surgeon: Marcheta Grammes, DPM;  Location: [facility];  Service: Orthopedics;  Laterality: Right;  . PROXIMAL INTERPHALANGEAL FUSION (PIP) Right 07/10/2013   Procedure: ARTHRODESIS PIPJ  2ND DIGIT RIGHT FOOT;  Surgeon: Marcheta Grammes, DPM;  Location: [facility];  Service: Orthopedics;  Laterality: Right;   Allergies  Allergen Reactions  . Bee Venom Swelling  . Codeine Anaphylaxis    Tongue swelled  . Penicillins Swelling and Rash    Has patient had a PCN reaction causing immediate rash, facial/tongue/throat swelling, SOB or lightheadedness with hypotension: No, delayed Has patient had a  PCN reaction causing severe rash involving mucus membranes or skin necrosis: No Has patient had a PCN reaction that required hospitalization No Has patient had a PCN reaction occurring within the last 10 years: No If all of the above answers are "NO", then may proceed with Cephalosporin use.   . Bupropion Other (See Comments)    "Made my skin crawl"  . Sudafed [Pseudoephedrine Hcl] Rash   Prior to Admission medications   Medication Sig Start Date End Date Taking? Authorizing Provider  acetaminophen (TYLENOL) 500 MG tablet Take 500 mg by mouth at bedtime as needed for moderate pain.    Yes [provider]  albuterol (PROVENTIL) (2.5 MG/3ML) 0.083% nebulizer solution Take 3 mLs (2.5 mg total) by nebulization every 4 (four) hours as needed for wheezing or shortness of breath. 09/09/20  Yes Lauraine Rinne, NP  ALPRAZolam Duanne Moron) 0.5 MG tablet TAKE 1 TABLET BY MOUTH AT BEDTIME AS NEEDED FOR ANXIETY 10/19/20  Yes [name], NP  aspirin 81 MG chewable tablet Chew 81 mg by mouth every morning.    Yes [provider]  atorvastatin (LIPITOR) 20 MG tablet TAKE ONE TABLET (20MG TOTAL)  BY MOUTH DAILY 01/12/20  Yes Satira Sark, MD  bisoprolol (ZEBETA) 10 MG tablet TAKE ONE (1) TABLET BY MOUTH EVERY DAY 10/19/20  Yes Satira Sark, MD  Budeson-Glycopyrrol-Formoterol (BREZTRI AEROSPHERE) 160-9-4.8 MCG/ACT AERO Inhale 2 puffs into the lungs 2 (two) times daily. 11/19/19  Yes Lauraine Rinne, NP  Cholecalciferol (VITAMIN D) 50 MCG (2000 UT) CAPS Take by mouth daily.   Yes [provider]  Continuous Blood Gluc Receiver (FREESTYLE LIBRE 14 DAY READER) [place] See admin instructions. 09/28/20  Yes [name], NP  Continuous Blood Gluc Sensor (FREESTYLE LIBRE 14 DAY SENSOR) MISC Inject 1 each into the skin See admin instructions. 09/28/20  Yes [name], NP  Continuous Blood Gluc Sensor (FREESTYLE LIBRE 2 SENSOR) MISC 1 each by Does not apply route daily. 09/29/20  Yes  [name], NP  cyanocobalamin (,VITAMIN B-12,) 1000 MCG/ML injection Inject 1 mL (1,000 mcg total) into the muscle every 30 (thirty) days. 01/07/20  Yes Derek Jack, MD  dicyclomine (BENTYL) 10 MG capsule Take 1 capsule (10 mg total) by mouth as needed for spasms (30 mins before meals and at bedtime as needed for diarrhea and abdominal cramps.). Take no more than four times daily but take only as needed 06/04/20  Yes Lewis, Laureen Ochs, PA-C  empagliflozin (JARDIANCE) 10 MG TABS tablet Take 10 mg by mouth daily before breakfast. 12/23/19  Yes Shamleffer, Melanie Crazier, MD  FLUoxetine (PROZAC) 10 MG capsule Take 1 capsule (10 mg total) by mouth daily. After 2 weeks, increase to 2 capsules (57m total) by mouth daily. 09/14/20  Yes MMaximiano Coss NP  gabapentin (NEURONTIN) 300 MG capsule TAKE ONE CAPSULE (300 MG TOTAL) BY MOUTHTWO TIMES DAILY. 09/01/20  Yes MMaximiano Coss NP  glucose blood ([facility]) test strip Use as instructed 02/26/19  Yes Cirigliano, Mary K, DO  glucose monitoring kit (FREESTYLE) monitoring kit Use to check blood sugar BID as directed 01/24/19  Yes Cirigliano, Mary K, DO  lactase (LACTAID) 3000 units tablet Take by mouth as needed.   Yes [provider]  Lancets (ONETOUCH DELICA PLUS [id number] [place]ast [place]1 each by Does not apply route 2 (two) times a day. 02/26/19  Yes Cirigliano, Mary K, DO  losartan (COZAAR) 100 MG tablet TAKE ONE (1) TABLET BY MOUTH EVERY DAY 10/04/20  Yes MSatira Sark MD  metFORMIN (GLUCOPHAGE) 1000 MG tablet Take 1 tablet (1,000 mg total) by mouth 2 (two) times daily with a meal. Patient taking differently: Take 500 mg by mouth 2 (two) times daily with a meal. Takes 500 mg twice daily.  Total of 1000 mg daily. 08/19/19  Yes MMaximiano Coss NP  Misc. Devices MISC Please provide supplies (needle, syringe, alcohol swabs) needed for patient to self-administer B-12 injections monthly. 01/07/20  Yes KDerek Jack MD  pantoprazole  (PROTONIX) 40 MG tablet TAKE ONE (1) TABLET 30 MINS BEFORE YOUR FIRST MEAL. 09/02/20  Yes CEloise Harman DO  spironolactone (ALDACTONE) 25 MG tablet Take 1 tablet (25 mg total) by mouth daily. 09/07/20  Yes Nida, GMarella Chimes MD  UFlossie BuffyTUBERCULIN SAFETY SYR 25G X 1" 1 ML MISC USE TO ADMINISTER INJECTABLE VITAMIN B12. 01/07/20  Yes KDerek Jack MD   Social History   Socioeconomic History  . Marital status: Married    Spouse name: WAlveta Heimlich . Number of children: 1  . Years of education: 140 . Highest education level: Not on file  Occupational History  . Occupation: disabled    Comment: heart  Tobacco Use  . Smoking status: Light Tobacco Smoker    Packs/day: 0.25    Years: 44.00    Pack years: 11.00    Types: Cigarettes    Start date: 05/10/1975  . Smokeless tobacco: Never Used  . Tobacco comment: 2 cigarettes per day 12/10/2019  Vaping Use  . Vaping Use: Former  Substance and Sexual Activity  . Alcohol use: Not Currently    Alcohol/week: 2.0 standard drinks    Types: 2 Glasses of wine per week  . Drug use: No  . Sexual activity: Yes    Birth control/protection: Surgical  Other Topics Concern  . Not on file  Social History Narrative   Disabled   Lives with husband [name]   Two dogs   Social Determinants of Health   Financial Resource Strain: Not on file  Food Insecurity: Not on file  Transportation Needs: Not on file  Physical Activity: Not on file  Stress: Not on file  Social Connections: Not on file  Intimate Partner Violence: Not At Risk  . Fear of Current or Ex-Partner: No  . Emotionally Abused: No  . Physically Abused: No  . Sexually Abused: No    Review of Systems Per HPI.   Objective:   Vitals:   10/22/20 1053 10/22/20 1101  BP: (!) 145/80 132/62  Pulse: 81   Temp: (!) 96.9 F (36.1 C)   TempSrc: Temporal   SpO2: 96%   Weight: 144 lb (65.3 kg)   Height: _0  (1.651 m)      Physical Exam Vitals reviewed.  Constitutional:       General: She is not in acute distress.    Appearance: She is well-developed and well-nourished.  HENT:     Head: Normocephalic and atraumatic.  Cardiovascular:     Rate and Rhythm: Normal rate.  Pulmonary:     Effort: Pulmonary effort is normal.  Musculoskeletal:     Thoracic back: Tenderness present.     Lumbar back: Tenderness and bony tenderness present. Negative right straight leg raise test and negative left straight leg raise test.       Back:     Comments: Tender to palpation mid thoracic spine, no apparent soft tissue swelling.  Slight tenderness along the paraspinals of the left lower thoracic spine, tender to palpation diffusely at the mid lumbar spine and slightly to the paraspinals bilaterally.  Negative seated straight leg raise.  Able to heel and toe walk bilaterally.  Range of motion slightly decreased with lateral flexion, rotation with some discomfort, flexion intact.  Slight discomfort from flexion to extension.  Reflexes 2+ at patella and Achilles with Babinski negative bilaterally.  Neurological:     Mental Status: She is alert and oriented to person, place, and time.  Psychiatric:        Mood and Affect: Mood and affect normal.       Assessment & Plan:  Tammy Mcpherson is a 65 y.o. adult . Mid back pain - Plan: DG Thoracic Spine 2 View, cyclobenzaprine (FLEXERIL) 5 MG tablet  Acute bilateral low back pain with bilateral sciatica - Plan: DG Lumbar Spine Complete, cyclobenzaprine (FLEXERIL) 5 MG tablet  Suspected musculoskeletal cause of back pain, no known injury.  No red flags on history or exam.  Will check imaging as above, but initially can try low-dose muscle relaxant with potential side effects and risk discussed.  Tylenol is fine for now as well.  Avoid NSAIDs with previous gastric ulcer.  RTC/ER precautions  given, handout given on back pain for nonpharmacologic treatment as well.  Meds ordered this encounter  Medications  . cyclobenzaprine (FLEXERIL) 5 MG  tablet    Sig: 1 pill by mouth up to every 8 hours as needed. Start with one pill by mouth each bedtime as needed due to sedation    Dispense:  15 tablet    Refill:  0   Patient Instructions    Okay to continue Tylenol, do not exceed maximum dosing on label.  I did write muscle relaxant up to 3 times per day but be very careful with that medication as it can cause sedation or dizziness.  I did order x-rays of your mid and lower back, those can be performed at [facility] on [address] today.  You can show up for that imaging.  See other information below including ice or heat and other treatments you can do at home.  If back pain is not improving into next week or worsening sooner follow-up here, urgent care or emergency room.   Return to the clinic or go to the nearest emergency room if any of your symptoms worsen or new symptoms occur.   Acute Back Pain, Adult Acute back pain is sudden and usually short-lived. It is often caused by an injury to the muscles and tissues in the back. The injury may result from:  A muscle or ligament getting overstretched or torn (strained). Ligaments are tissues that connect bones to each other. Lifting something improperly can cause a back strain.  Wear and tear (degeneration) of the spinal disks. Spinal disks are circular tissue that provide cushioning between the bones of the spine (vertebrae).  Twisting motions, such as while playing sports or doing yard work.  A hit to the back.  Arthritis. You may have a physical exam, lab tests, and imaging tests to find the cause of your pain. Acute back pain usually goes away with rest and home care. Follow these instructions at home: Managing pain, stiffness, and swelling  Treatment may include medicines for pain and inflammation that are taken by mouth or applied to the skin, prescription pain medicine, or muscle relaxants. Take over-the-counter and prescription medicines only as told by your  health care provider.  Your health care provider may recommend applying ice during the first 24-48 hours after your pain starts. To do this: ? Put ice in a plastic bag. ? Place a towel between your skin and the bag. ? Leave the ice on for 20 minutes, 2-3 times a day.  If directed, apply heat to the affected area as often as told by your health care provider. Use the heat source that your health care provider recommends, such as a moist heat pack or a heating pad. ? Place a towel between your skin and the heat source. ? Leave the heat on for 20-30 minutes. ? Remove the heat if your skin turns bright red. This is especially important if you are unable to feel pain, heat, or cold. You have a greater risk of getting burned. Activity  Do not stay in bed. Staying in bed for more than 1-2 days can delay your recovery.  Sit up and stand up straight. Avoid leaning forward when you sit or hunching over when you stand. ? If you work at a desk, sit close to it so you do not need to lean over. Keep your chin tucked in. Keep your neck drawn back, and keep your elbows bent at a 90-degree  angle (right angle). ? Sit high and close to the steering wheel when you drive. Add lower back (lumbar) support to your car seat, if needed.  Take short walks on even surfaces as soon as you are able. Try to increase the length of time you walk each day.  Do not sit, drive, or stand in one place for more than 30 minutes at a time. Sitting or standing for long periods of time can put stress on your back.  Do not drive or use heavy machinery while taking prescription pain medicine.  Use proper lifting techniques. When you bend and lift, use positions that put less stress on your back: ? [place] your knees. ? Keep the load close to your body. ? Avoid twisting.  Exercise regularly as told by your health care provider. Exercising helps your back heal faster and helps prevent back injuries by keeping muscles strong and  flexible.  Work with a physical therapist to make a safe exercise program, as recommended by your health care provider. Do any exercises as told by your physical therapist.   Lifestyle  Maintain a healthy weight. Extra weight puts stress on your back and makes it difficult to have good posture.  Avoid activities or situations that make you feel anxious or stressed. Stress and anxiety increase muscle tension and can make back pain worse. Learn ways to manage anxiety and stress, such as through exercise. General instructions  Sleep on a firm mattress in a comfortable position. Try lying on your side with your knees slightly bent. If you lie on your back, put a pillow under your knees.  Follow your treatment plan as told by your health care provider. This may include: ? Cognitive or behavioral therapy. ? Acupuncture or massage therapy. ? Meditation or yoga. Contact a health care provider if:  You have pain that is not relieved with rest or medicine.  You have increasing pain going down into your legs or buttocks.  Your pain does not improve after 2 weeks.  You have pain at night.  You lose weight without trying.  You have a fever or chills. Get help right away if:  You develop new bowel or bladder control problems.  You have unusual weakness or numbness in your arms or legs.  You develop nausea or vomiting.  You develop abdominal pain.  You feel faint. Summary  Acute back pain is sudden and usually short-lived.  Use proper lifting techniques. When you bend and lift, use positions that put less stress on your back.  Take over-the-counter and prescription medicines and apply heat or ice as directed by your health care provider. This information is not intended to replace advice given to you by your health care provider. Make sure you discuss any questions you have with your health care provider. Document Revised: 06/11/2020 Document Reviewed: 06/11/2020 Elsevier Patient  Education  2021 [employer].   If you have lab work done today you will be contacted with your lab results within the next 2 weeks.  If you have not heard from [place] then please contact us. The fastest way to get your results is to register for My Chart.   IF you received an x-ray today, you will receive an invoice from [facility]. Please contact [facility] at [phone] with questions or concerns regarding your invoice.   IF you received labwork today, you will receive an invoice from [facility]. Please contact [facility] at [phone] with questions or concerns regarding your invoice.   Our  billing staff will not be able to assist you with questions regarding bills from these companies.  You will be contacted with the lab results as soon as they are available. The fastest way to get your results is to activate your My Chart account. Instructions are located on the last page of this paperwork. If you have not heard from [place] regarding the results in 2 weeks, please contact this office.          Signed, Merri Ray, MD [facility]

## 2020-10-24 DIAGNOSIS — I10 Essential (primary) hypertension: Secondary | ICD-10-CM | POA: Diagnosis not present

## 2020-10-24 DIAGNOSIS — R9431 Abnormal electrocardiogram [ECG] [EKG]: Secondary | ICD-10-CM | POA: Diagnosis not present

## 2020-10-24 DIAGNOSIS — Z79899 Other long term (current) drug therapy: Secondary | ICD-10-CM | POA: Diagnosis not present

## 2020-10-24 DIAGNOSIS — F17200 Nicotine dependence, unspecified, uncomplicated: Secondary | ICD-10-CM | POA: Diagnosis not present

## 2020-10-24 DIAGNOSIS — R531 Weakness: Secondary | ICD-10-CM | POA: Diagnosis not present

## 2020-10-24 DIAGNOSIS — J9811 Atelectasis: Secondary | ICD-10-CM | POA: Diagnosis not present

## 2020-10-24 DIAGNOSIS — Z720 Tobacco use: Secondary | ICD-10-CM | POA: Diagnosis not present

## 2020-10-24 DIAGNOSIS — R29810 Facial weakness: Secondary | ICD-10-CM | POA: Diagnosis not present

## 2020-10-24 DIAGNOSIS — E7800 Pure hypercholesterolemia, unspecified: Secondary | ICD-10-CM | POA: Diagnosis not present

## 2020-10-24 DIAGNOSIS — G510 Bell's palsy: Secondary | ICD-10-CM | POA: Diagnosis not present

## 2020-10-25 ENCOUNTER — Ambulatory Visit
Admission: RE | Admit: 2020-10-25 | Discharge: 2020-10-25 | Disposition: A | Discharge status: Home / Self Care | Payer: PPO | Source: Ambulatory Visit | Attending: Family Medicine | Admitting: Family Medicine

## 2020-10-25 ENCOUNTER — Encounter: Payer: Self-pay | Admitting: Pulmonary Disease

## 2020-10-25 ENCOUNTER — Ambulatory Visit (INDEPENDENT_AMBULATORY_CARE_PROVIDER_SITE_OTHER): Payer: PPO | Admitting: Pulmonary Disease

## 2020-10-25 ENCOUNTER — Other Ambulatory Visit: Payer: Self-pay | Admitting: Family Medicine

## 2020-10-25 ENCOUNTER — Other Ambulatory Visit: Payer: Self-pay

## 2020-10-25 DIAGNOSIS — I700 Atherosclerosis of aorta: Secondary | ICD-10-CM | POA: Diagnosis not present

## 2020-10-25 DIAGNOSIS — Z720 Tobacco use: Secondary | ICD-10-CM

## 2020-10-25 DIAGNOSIS — F17209 Nicotine dependence, unspecified, with unspecified nicotine-induced disorders: Secondary | ICD-10-CM

## 2020-10-25 DIAGNOSIS — Z0000 Encounter for general adult medical examination without abnormal findings: Secondary | ICD-10-CM

## 2020-10-25 DIAGNOSIS — M5442 Lumbago with sciatica, left side: Secondary | ICD-10-CM

## 2020-10-25 DIAGNOSIS — M549 Dorsalgia, unspecified: Secondary | ICD-10-CM

## 2020-10-25 DIAGNOSIS — J449 Chronic obstructive pulmonary disease, unspecified: Secondary | ICD-10-CM

## 2020-10-25 DIAGNOSIS — M545 Low back pain, unspecified: Secondary | ICD-10-CM | POA: Diagnosis not present

## 2020-10-25 DIAGNOSIS — M5441 Lumbago with sciatica, right side: Secondary | ICD-10-CM

## 2020-10-25 DIAGNOSIS — M4804 Spinal stenosis, thoracic region: Secondary | ICD-10-CM | POA: Diagnosis not present

## 2020-10-25 MED ORDER — BREZTRI AEROSPHERE 160-9-4.8 MCG/ACT IN AERO: 2.0000 | INHALATION_SPRAY | Freq: Two times a day (BID) | RESPIRATORY_TRACT | 12 refills | Status: DC

## 2020-10-25 NOTE — Assessment & Plan Note (Signed)
Current smoker Patient reports that she has ongoing stress right now does not prepare to stop smoking Already established in lung cancer screening program   Plan: Strongly recommend patient consider stopping smoking Can further evaluate at office visit in February/2022 with Dr. Ancil Linsey established in lung cancer screening program

## 2020-10-25 NOTE — Progress Notes (Signed)
Virtual Visit via Telephone Note  I connected with Tammy Mcpherson  on 10/25/20 at  3:30 PM EST by telephone and verified that I am speaking with the correct person using two identifiers.  Location: Patient: Home Provider: [facility] - [address], [place], [place], [place] [address]   I discussed the limitations, risks, security and privacy concerns of performing an evaluation and management service by telephone and the availability of in person appointments. I also discussed with the patient that there may be a patient responsible charge related to this service. The patient expressed understanding and agreed to proceed.  Patient consented to consult via telephone: Yes People present and their role in pt care: Pt    History of Present Illness:  65 year old female current every day smoker followed in our office for COPD  PMH: Hypertension, anemia, hyperlipidemia, gastric AVM, PAD, vitamin D deficiency, vitamin B12 deficiency, prediabetes Smoker/ Smoking History: Current Everyday Smoker.  Smoking 2-3 cigarettes per day.  44-pack-year smoking history Maintenance: Breztri Pt of: Dr. [name]   Chief complaint: follow up   65 year old female current everyday smoker followed in our office for COPD.  Patient completing follow-up with our office today telephonically.  She reports that overall her breathing is doing quite well.  She does continue to smoke 2 to 3 cigarettes a day.  She does not feel she is ready to stop smoking at this point in time.  She is adherent to using her ICS/LABA/LAMA inhaler.  She is requesting a refill of this today.  She does use her albuterol nebs 1 time daily at night.  She is currently vaccinated COVID-19 with booster.  She is also obtain her shingles vaccine.  Patient was recently seen at [facility] emergency room and diagnosed with Bell's palsy.  She has an upcoming appointment with primary care on 10/26/2020.  She reports that primary care plans on  referring her to neurology after this visit.  Smoking assessment and cessation counseling  Patient currently smoking: 2-3 cigarettes a day  I have advised the patient to quit/stop smoking as soon as possible due to high risk for multiple medical problems.  It will also be very difficult for [place] to manage patient's  respiratory symptoms and status if we continue to expose her lungs to a known irritant.  We do not advise e-cigarettes as a form of stopping smoking.  Patient is not willing to quit smoking.  Not ready to set quit date.  We will schedule an office visit in February/2022 with Dr. [name] to further review.  Could consider additional measures such as nicotine replacement therapy at that time.  Patient already established in lung cancer screening program.  Patient is aware to contact our office sooner if she becomes interested in smoking cessation.  Continue  I have advised the patient that we can assist and have options of nicotine replacement therapy, provided smoking cessation education today, provided smoking cessation counseling, and provided cessation resources.  Follow-up next office visit office visit for assessment of smoking cessation.    Smoking cessation counseling advised for: 3 min    Observations/Objective:  10/08/2018-CBC with differential-eosinophils relative 1, eosinophils absolute 0.2  04/30/2016-CT angios chest- cardiomegaly with interstitial pulmonary edema very small bilateral pleural effusions, no pulmonary emboli identified  12/04/2018-CT chest lung cancer screening- biapical pleural-parenchymal scarring, mild centrilobular emphysema, lung RADS 1  03/20/2018-echocardiogram-LV ejection fraction 65 to 40%, grade 1 diastolic dysfunction, mild LVH  11/01/2018- pulmonary function test-FVC 2.42 (76% predicted), postbronchodilator  ratio 74, postbronchodilator FEV1 1.52 (62% predicted), no significant bronchodilator response, DLCO was 51 >>>Patient did take Spiriva prior  to testing >>>Patient has not taken Symbicort today  Social History   Tobacco Use  Smoking Status Light Tobacco Smoker  . Packs/day: 0.25  . Years: 44.00  . Pack years: 11.00  . Types: Cigarettes  . Start date: 05/10/1975  Smokeless Tobacco Never Used  Tobacco Comment   2 cigarettes per day 12/10/2019   Immunization History  Administered Date(s) Administered  . Influenza,inj,Quad PF,6+ Mos 07/04/2017, 07/10/2018, 07/02/2019, 07/07/2020  . Pneumococcal Polysaccharide-23 11/01/2018      Assessment and Plan:  Chronic obstructive pulmonary disease ([place]) Plan:  Continue Breztri  Continue albuterol rescue inhaler or nebulizer every 6 hours as needed for shortness of breath or wheezing Strongly recommend stopping smoking We will coordinate close follow-up with our office for a in office evaluation  Healthcare maintenance Up-to-date with COVID-19 vaccination Up-to-date with shingles vaccination Up-to-date with pneumonia vaccination Up-to-date with flu vaccination  Tobacco abuse Current smoker Patient reports that she has ongoing stress right now does not prepare to stop smoking Already established in lung cancer screening program   Plan: Strongly recommend patient consider stopping smoking Can further evaluate at office visit in February/2022 with Dr. Ancil Linsey established in lung cancer screening program   Follow Up Instructions:  Return in about 4 weeks (around 11/22/2020), or if symptoms worsen or fail to improve, for Follow up with Dr. [name].   I discussed the assessment and treatment plan with the patient. The patient was provided an opportunity to ask questions and all were answered. The patient agreed with the plan and demonstrated an understanding of the instructions.   The patient was advised to call back or seek an in-person evaluation if the symptoms worsen or if the condition fails to improve as anticipated.  I provided 23 minutes of non-face-to-face time  during this encounter.   Lauraine Rinne, NP

## 2020-10-25 NOTE — Patient Instructions (Addendum)
You were seen today by Lauraine Rinne, NP  for:   1. Chronic obstructive pulmonary disease, unspecified COPD type ([place])  Breztri >>> 2 puffs in the morning right when you wake up, rinse out your mouth after use, 12 hours later 2 puffs, rinse after use >>> Take this daily, no matter what >>> This is not a rescue inhaler   Only use your albuterol as a rescue medication to be used if you can't catch your breath by resting or doing a relaxed purse lip breathing pattern.  - The less you use it, the better it will work when you need it. - Ok to use up to 2 puffs  every 4 hours if you must but call for immediate appointment if use goes up over your usual need - Don't leave home without it !!  (think of it like the spare tire for your car)   Note your daily symptoms > remember "red flags" for COPD:   >>>Increase in cough >>>increase in sputum production >>>increase in shortness of breath or activity  intolerance.   If you notice these symptoms, please call the office to be seen.    2. Tobacco abuse  Remain established in lung cancer screening program  Your next lung cancer screening CT is due in March/2022  We recommend that you stop smoking.  >>>You need to set a quit date >>>If you have friends or family who smoke, let them know you are trying to quit and not to smoke around you or in your living environment  Smoking Cessation Resources:  1 800 QUIT NOW  >>> Patient to call this resource and utilize it to help support her quit smoking >>> Keep up your hard work with stopping smoking  You can also contact the [facility] >>>For smoking cessation classes call [phone]  We do not recommend using e-cigarettes as a form of stopping smoking  You can sign up for smoking cessation support texts and information:  >>>https://smokefree.gov/smokefreetxt  3. Healthcare maintenance  Up-to-date with vaccinations great job  Recommend that you stop smoking  Keep upcoming  appointment with primary care on 10/26/2020   We recommend today:  No orders of the defined types were placed in this encounter.  No orders of the defined types were placed in this encounter.  Meds ordered this encounter  Medications  . Budeson-Glycopyrrol-Formoterol (BREZTRI AEROSPHERE) 160-9-4.8 MCG/ACT AERO    Sig: Inhale 2 puffs into the lungs 2 (two) times daily.    Dispense:  11.8 g    Refill:  12    Order Specific Question:   Lot Number?    Answer:   [id number]    Order Specific Question:   Expiration Date?    Answer:   05/01/2021    Order Specific Question:   Manufacturer?    Answer:   [employer] [[id number]]    Follow Up:    Return in about 4 weeks (around 11/22/2020), or if symptoms worsen or fail to improve, for Follow up with Dr. [name].   Notification of test results are managed in the following manner: If there are  any recommendations or changes to the  plan of care discussed in office today,  we will contact you and let you know what they are. If you do not hear from [place], then your results are normal and you can view them through your  MyChart account , or a letter will be sent to you. Thank you again for trusting [place] with your  care  - Thank you, [facility]    It is flu season:   >>> Best ways to protect herself from the flu: Receive the yearly flu vaccine, practice good hand hygiene washing with soap and also using hand sanitizer when available, eat a nutritious meals, get adequate rest, hydrate appropriately       Please contact the office if your symptoms worsen or you have concerns that you are not improving.   Thank you for choosing [facility] for your healthcare, and for allowing [place] to partner with you on your healthcare journey. I am thankful to be able to provide care to you today.   [name] FNP-C

## 2020-10-25 NOTE — Assessment & Plan Note (Signed)
Up-to-date with COVID-19 vaccination Up-to-date with shingles vaccination Up-to-date with pneumonia vaccination Up-to-date with flu vaccination

## 2020-10-25 NOTE — Assessment & Plan Note (Signed)
Plan:  Continue Breztri  Continue albuterol rescue inhaler or nebulizer every 6 hours as needed for shortness of breath or wheezing Strongly recommend stopping smoking We will coordinate close follow-up with our office for a in office evaluation

## 2020-10-26 ENCOUNTER — Encounter: Payer: Self-pay | Admitting: Registered Nurse

## 2020-10-26 ENCOUNTER — Other Ambulatory Visit: Payer: Self-pay

## 2020-10-26 ENCOUNTER — Ambulatory Visit (INDEPENDENT_AMBULATORY_CARE_PROVIDER_SITE_OTHER): Payer: PPO | Admitting: Registered Nurse

## 2020-10-26 VITALS — BP 114/70 | HR 90 | Temp 98.0°F | Resp 18 | Ht 65.0 in | Wt 143.4 lb

## 2020-10-26 DIAGNOSIS — M545 Low back pain, unspecified: Secondary | ICD-10-CM | POA: Diagnosis not present

## 2020-10-26 DIAGNOSIS — G510 Bell's palsy: Secondary | ICD-10-CM

## 2020-10-26 DIAGNOSIS — G8929 Other chronic pain: Secondary | ICD-10-CM | POA: Diagnosis not present

## 2020-10-26 NOTE — Patient Instructions (Signed)
° ° ° °  If you have lab work done today you will be contacted with your lab results within the next 2 weeks.  If you have not heard from us then please contact us. The fastest way to get your results is to register for My Chart. ° ° °IF you received an x-ray today, you will receive an invoice from [facility]. Please contact [facility] at [phone] with questions or concerns regarding your invoice.  ° °IF you received labwork today, you will receive an invoice from [facility]. Please contact [facility] at [phone] with questions or concerns regarding your invoice.  ° °Our billing staff will not be able to assist you with questions regarding bills from these companies. ° °You will be contacted with the lab results as soon as they are available. The fastest way to get your results is to activate your My Chart account. Instructions are located on the last page of this paperwork. If you have not heard from us regarding the results in 2 weeks, please contact this office. °  ° ° ° °

## 2020-10-26 NOTE — Progress Notes (Signed)
Established Patient Office Visit  Subjective:  [name], adult    DOB: [birth year]  Age: 65 y.o. MRN: [id number]  CC:  Chief Complaint  Patient presents with  . Hospitalization Follow-up    Patient states she went to the ED 2 days ago and was dx with Bell Palsy. Per patient she noticed her face changing and her right eye wouldn't blink.    HPI Tammy Mcpherson presents for ED follow up   Seen two days ago Dx with Bells Palsy of R side Started on prednisone 24m PO qd and valacyclovir 10037mPO qd Has been taking without issue  Otherwise, wants to review xrays Seen by Dr. GrCarlota Raspberryor lower back pain Somewhat improved xrays taken yesterday   Past Medical History:  Diagnosis Date  . Anemia 04/30/2016  . Anxiety   . Blood transfusion without reported diagnosis   . Cardiomyopathy ([facility]   a. EF 40-45% by echo in 04/2016 b. Improved to 60-65% by repeat imaging in 2018  . CHF (congestive heart failure) ([facility]  . COPD (chronic obstructive pulmonary disease) ([facility]  . Coronary artery calcification seen on CT scan   . Essential hypertension   . GERD (gastroesophageal reflux disease)   . History of bronchitis   . History of GI bleed   . Hyperlipidemia   . Iron deficiency anemia 05/12/2016   Bleeding ulcers  . Leukocytosis 03/23/2020  . Pollen allergy   . PSVT (paroxysmal supraventricular tachycardia) ([facility]  . PVC's (premature ventricular contractions)   . Thrombocytosis 03/23/2020  . Vitamin B12 deficiency 03/23/2020    Past Surgical History:  Procedure Laterality Date  . ABDOMINAL HYSTERECTOMY     polyps  about age 65. AIBarbie BannerSTEOTOMY Right 07/10/2013   Procedure: AIBarbie BannerSTEOTOMY RIGHT FOOT;  Surgeon: BeMarcheta GrammesDPM;  Location: [facility];  Service: Orthopedics;  Laterality: Right;  . AMPUTATION Left 05/09/2017   Procedure: AMPUTATION 5TH TOE LEFT FOOT;  Surgeon: McCaprice BeaverDPM;  Location: [facility];  Service: Podiatry;  Laterality: Left;  .  AMPUTATION Left 06/13/2017   Procedure: AMPUTATION 2ND TOE LEFT FOOT;  Surgeon: McCaprice BeaverDPM;  Location: [facility];  Service: Podiatry;  Laterality: Left;  . BUNIONECTOMY Right 07/10/2013   Procedure: VOGLER BUNIONECTOMY RIGHT FOOT;  Surgeon: BeMarcheta GrammesDPM;  Location: [facility];  Service: Orthopedics;  Laterality: Right;  . CHOLECYSTECTOMY N/A 08/22/2017   Procedure: LAPAROSCOPIC CHOLECYSTECTOMY;  Surgeon: BrVirl CageyMD;  Location: [facility];  Service: General;  Laterality: N/A;  . COLONOSCOPY N/A 07/07/2016   Dr. FiOneida Alarredundant left colon, diverticulosis at hepatic flexure, non-bleeding internal hemorrhoids  . ESOPHAGOGASTRODUODENOSCOPY N/A 07/07/2016   Dr. FiOneida Alarmany non-bleeding cratered gastric ulcers without stigmata of bleeding in gastric antrum. four 2-3 mm angioectasias without bleeding in duodenal bulb and second portion of duodenum s/p APC. Chroni gastritis on path.   . ESOPHAGOGASTRODUODENOSCOPY N/A 08/03/2017   Dr. FiOneida Alarerosive gastritis, AVMs. Found a single non-bleeding angioectasia in stomach, s/p APC therapy. Four non-bleeding angioectasias in duodenum s/p APC. Non-bleeding erosive gastropathy  . IR ANGIOGRAM EXTREMITY LEFT  04/24/2017  . IR FEM POP ART ATHERECT INC PTA MOD SED  04/24/2017  . IR INFUSION THROMBOL ARTERIAL INITIAL (MS)  04/24/2017  . IR RADIOLOGIST EVAL & MGMT  12/05/2016  . IR US[place]UIDE VASC ACCESS RIGHT  04/24/2017  . LIVER BIOPSY N/A 08/22/2017   Procedure: LIVER BIOPSY;  Surgeon: BrVirl CageyMD;  Location: [facility];  Service: General;  Laterality: N/A;  . METATARSAL HEAD EXCISION Right 07/10/2013   Procedure: METATARSAL HEAD RESECTION OF DIGITS 2 AND 3 RIGHT FOOT;  Surgeon: Marcheta Grammes, DPM;  Location: [facility];  Service: Orthopedics;  Laterality: Right;  . PROXIMAL INTERPHALANGEAL FUSION (PIP) Right 07/10/2013   Procedure: ARTHRODESIS PIPJ  2ND DIGIT RIGHT FOOT;  Surgeon: Marcheta Grammes, DPM;  Location: [facility];   Service: Orthopedics;  Laterality: Right;    Family History  Adopted: Yes  Problem Relation Age of Onset  . Hypertension Father   . Coronary artery disease Sister   . Ulcers Maternal Grandmother   . Colon cancer Neg Hx        unknown, was adopted    Social History   Socioeconomic History  . Marital status: Married    Spouse name: [name]  . Number of children: 1  . Years of education: 105  . Highest education level: Not on file  Occupational History  . Occupation: disabled    Comment: heart  Tobacco Use  . Smoking status: Light Tobacco Smoker    Packs/day: 0.25    Years: 44.00    Pack years: 11.00    Types: Cigarettes    Start date: 05/10/1975  . Smokeless tobacco: Never Used  . Tobacco comment: 2 cigarettes per day 12/10/2019  Vaping Use  . Vaping Use: Former  Substance and Sexual Activity  . Alcohol use: Not Currently    Alcohol/week: 2.0 standard drinks    Types: 2 Glasses of wine per week  . Drug use: No  . Sexual activity: Yes    Birth control/protection: Surgical  Other Topics Concern  . Not on file  Social History Narrative   Disabled   Lives with husband [name]   Two dogs   Social Determinants of Health   Financial Resource Strain: Not on file  Food Insecurity: Not on file  Transportation Needs: Not on file  Physical Activity: Not on file  Stress: Not on file  Social Connections: Not on file  Intimate Partner Violence: Not At Risk  . Fear of Current or Ex-Partner: No  . Emotionally Abused: No  . Physically Abused: No  . Sexually Abused: No    Outpatient Medications Prior to Visit  Medication Sig Dispense Refill  . acetaminophen (TYLENOL) 500 MG tablet Take 500 mg by mouth at bedtime as needed for moderate pain.     Marland Kitchen albuterol (PROVENTIL) (2.5 MG/3ML) 0.083% nebulizer solution Take 3 mLs (2.5 mg total) by nebulization every 4 (four) hours as needed for wheezing or shortness of breath. 75 mL 5  . ALPRAZolam (XANAX) 0.5 MG tablet TAKE 1 TABLET BY MOUTH  AT BEDTIME AS NEEDED FOR ANXIETY 30 tablet 0  . aspirin 81 MG chewable tablet Chew 81 mg by mouth every morning.     Marland Kitchen atorvastatin (LIPITOR) 20 MG tablet TAKE ONE TABLET (20MG TOTAL) BY MOUTH DAILY 90 tablet 3  . bisoprolol (ZEBETA) 10 MG tablet TAKE ONE (1) TABLET BY MOUTH EVERY DAY 90 tablet 3  . Budeson-Glycopyrrol-Formoterol (BREZTRI AEROSPHERE) 160-9-4.8 MCG/ACT AERO Inhale 2 puffs into the lungs 2 (two) times daily. 11.8 g 12  . Cholecalciferol (VITAMIN D) 50 MCG (2000 UT) CAPS Take by mouth daily.    . Continuous Blood Gluc Receiver (FREESTYLE LIBRE 14 DAY READER) DEVI See admin instructions. 4 each 0  . Continuous Blood Gluc Sensor (FREESTYLE LIBRE 14 DAY SENSOR) MISC Inject 1 each into the skin See admin  instructions. 4 each 0  . Continuous Blood Gluc Sensor (FREESTYLE LIBRE 2 SENSOR) MISC 1 each by Does not apply route daily. 1 each 0  . cyanocobalamin (,VITAMIN B-12,) 1000 MCG/ML injection Inject 1 mL (1,000 mcg total) into the muscle every 30 (thirty) days. 1 mL 11  . cyclobenzaprine (FLEXERIL) 5 MG tablet 1 pill by mouth up to every 8 hours as needed. Start with one pill by mouth each bedtime as needed due to sedation 15 tablet 0  . dicyclomine (BENTYL) 10 MG capsule Take 1 capsule (10 mg total) by mouth as needed for spasms (30 mins before meals and at bedtime as needed for diarrhea and abdominal cramps.). Take no more than four times daily but take only as needed 120 capsule 1  . empagliflozin (JARDIANCE) 10 MG TABS tablet Take 10 mg by mouth daily before breakfast. 30 tablet 4  . FLUoxetine (PROZAC) 10 MG capsule Take 1 capsule (10 mg total) by mouth daily. After 2 weeks, increase to 2 capsules (13m total) by mouth daily. 90 capsule 0  . gabapentin (NEURONTIN) 300 MG capsule TAKE ONE CAPSULE (300 MG TOTAL) BY MOUTHTWO TIMES DAILY. 180 capsule 1  . glucose blood (ONETOUCH ULTRA) test strip Use as instructed 100 each 12  . glucose monitoring kit (FREESTYLE) monitoring kit Use to check  blood sugar BID as directed 1 each 0  . lactase (LACTAID) 3000 units tablet Take by mouth as needed.    . Lancets (ONETOUCH DELICA PLUS [id number] MISC 1 each by Does not apply route 2 (two) times a day. 100 each 3  . losartan (COZAAR) 100 MG tablet TAKE ONE (1) TABLET BY MOUTH EVERY DAY 90 tablet 3  . metFORMIN (GLUCOPHAGE) 1000 MG tablet Take 1 tablet (1,000 mg total) by mouth 2 (two) times daily with a meal. (Patient taking differently: Take 500 mg by mouth 2 (two) times daily with a meal. Takes 500 mg twice daily.  Total of 1000 mg daily.) 180 tablet 3  . Misc. Devices MISC Please provide supplies (needle, syringe, alcohol swabs) needed for patient to self-administer B-12 injections monthly. 1 each 12  . pantoprazole (PROTONIX) 40 MG tablet TAKE ONE (1) TABLET 30 MINS BEFORE YOUR FIRST MEAL. 90 tablet 3  . predniSONE (DELTASONE) 20 MG tablet Take 20 mg by mouth daily with breakfast.    . spironolactone (ALDACTONE) 25 MG tablet Take 1 tablet (25 mg total) by mouth daily. 90 tablet 3  . ULTICARE TUBERCULIN SAFETY SYR 25G X 1" 1 ML MISC USE TO ADMINISTER INJECTABLE VITAMIN B12. 1 each 11  . valACYclovir (VALTREX) 500 MG tablet Take 500 mg by mouth 2 (two) times daily.     No facility-administered medications prior to visit.    Allergies  Allergen Reactions  . Bee Venom Swelling  . Codeine Anaphylaxis    Tongue swelled  . Penicillins Swelling and Rash    Has patient had a PCN reaction causing immediate rash, facial/tongue/throat swelling, SOB or lightheadedness with hypotension: No, delayed Has patient had a PCN reaction causing severe rash involving mucus membranes or skin necrosis: No Has patient had a PCN reaction that required hospitalization No Has patient had a PCN reaction occurring within the last 10 years: No If all of the above answers are "NO", then may proceed with Cephalosporin use.   . Bupropion Other (See Comments)    "Made my skin crawl"  . Sudafed [Pseudoephedrine Hcl]  Rash    ROS Review of Systems  Constitutional: Negative.  HENT: Negative.   Eyes: Negative.   Respiratory: Negative.   Cardiovascular: Negative.   Gastrointestinal: Negative.   Genitourinary: Negative.   Musculoskeletal: Negative.   Skin: Negative.   Neurological: Negative.   Psychiatric/Behavioral: Negative.   All other systems reviewed and are negative.     Objective:    Physical Exam Constitutional:      General: She is not in acute distress.    Appearance: Normal appearance. She is normal weight. She is not ill-appearing, toxic-appearing or diaphoretic.  Cardiovascular:     Rate and Rhythm: Normal rate and regular rhythm.     Heart sounds: Normal heart sounds. No murmur heard. No friction rub. No gallop.   Pulmonary:     Effort: Pulmonary effort is normal. No respiratory distress.     Breath sounds: Normal breath sounds. No stridor. No wheezing, rhonchi or rales.  Chest:     Chest wall: No tenderness.  Neurological:     General: No focal deficit present.     Mental Status: She is alert and oriented to person, place, and time. Mental status is at baseline.  Psychiatric:        Mood and Affect: Mood normal.        Behavior: Behavior normal.        Thought Content: Thought content normal.        Judgment: Judgment normal.     BP 114/70   Pulse 90   Temp 98 F (36.7 C) (Temporal)   Resp 18   Ht '5\' 5"'  (1.651 m)   Wt 143 lb 6.4 oz (65 kg)   SpO2 98%   BMI 23.86 kg/m  Wt Readings from Last 3 Encounters:  10/26/20 143 lb 6.4 oz (65 kg)  10/22/20 144 lb (65.3 kg)  10/05/20 150 lb 6.4 oz (68.2 kg)     There are no preventive care reminders to display for this patient.  There are no preventive care reminders to display for this patient.  Lab Results  Component Value Date   TSH 1.727 04/30/2016   Lab Results  Component Value Date   WBC 15.0 (H) 09/28/2020   HGB 14.7 09/28/2020   HCT 45.3 09/28/2020   MCV 98.9 09/28/2020   PLT 365 09/28/2020    Lab Results  Component Value Date   NA 135 09/28/2020   K 3.7 09/28/2020   CO2 24 09/28/2020   GLUCOSE 223 (H) 09/28/2020   BUN 11 09/28/2020   CREATININE 0.59 09/28/2020   BILITOT 0.6 09/28/2020   ALKPHOS 98 09/28/2020   AST 15 09/28/2020   ALT 18 09/28/2020   PROT 7.3 09/28/2020   ALBUMIN 3.9 09/28/2020   CALCIUM 9.3 09/28/2020   ANIONGAP 9 09/28/2020   Lab Results  Component Value Date   CHOL 202 (H) 09/01/2020   Lab Results  Component Value Date   HDL 31 (L) 09/01/2020   Lab Results  Component Value Date   LDLCALC 113 (H) 09/01/2020   Lab Results  Component Value Date   TRIG 335 (H) 09/01/2020   Lab Results  Component Value Date   CHOLHDL 6.5 (H) 09/01/2020   Lab Results  Component Value Date   HGBA1C 8.6 (A) 09/01/2020      Assessment & Plan:   Problem List Items Addressed This Visit   None   Visit Diagnoses    Right-sided Bell's palsy    -  Primary   Chronic bilateral low back pain, unspecified whether sciatica present  Relevant Medications   predniSONE (DELTASONE) 20 MG tablet      No orders of the defined types were placed in this encounter.   Follow-up: No follow-ups on file.   PLAN  Continue current regimen  Discussed course of treatment of Bells Palsy  If no improvement can get her to see neuro  Back pain should improve with prednisone  Patient encouraged to call clinic with any questions, comments, or concerns.  [name], NP

## 2020-10-29 ENCOUNTER — Ambulatory Visit: Payer: PPO | Admitting: Registered Nurse

## 2020-10-31 ENCOUNTER — Encounter: Payer: Self-pay | Admitting: Registered Nurse

## 2020-10-31 ENCOUNTER — Encounter: Payer: Self-pay | Admitting: Family Medicine

## 2020-10-31 DIAGNOSIS — F411 Generalized anxiety disorder: Secondary | ICD-10-CM

## 2020-10-31 DIAGNOSIS — M549 Dorsalgia, unspecified: Secondary | ICD-10-CM

## 2020-10-31 DIAGNOSIS — E1140 Type 2 diabetes mellitus with diabetic neuropathy, unspecified: Secondary | ICD-10-CM

## 2020-10-31 DIAGNOSIS — M5441 Lumbago with sciatica, right side: Secondary | ICD-10-CM

## 2020-11-01 ENCOUNTER — Other Ambulatory Visit: Payer: Self-pay | Admitting: Family Medicine

## 2020-11-01 ENCOUNTER — Other Ambulatory Visit: Payer: Self-pay | Admitting: Registered Nurse

## 2020-11-01 DIAGNOSIS — M549 Dorsalgia, unspecified: Secondary | ICD-10-CM

## 2020-11-01 DIAGNOSIS — M5441 Lumbago with sciatica, right side: Secondary | ICD-10-CM

## 2020-11-01 DIAGNOSIS — F411 Generalized anxiety disorder: Secondary | ICD-10-CM

## 2020-11-01 DIAGNOSIS — M5442 Lumbago with sciatica, left side: Secondary | ICD-10-CM

## 2020-11-01 NOTE — Telephone Encounter (Signed)
Requested medication (s) are due for refill today - unsure  Requested medication (s) are on the active medication list -yes  Future visit scheduled -no  Last refill: 10/22/20  Notes to clinic: Request RF non delegated rx  Requested Prescriptions  Pending Prescriptions Disp Refills   cyclobenzaprine (FLEXERIL) 5 MG tablet [Pharmacy Med Name: CYCLOBENZAPRINE HCL 5 MG TAB] 15 tablet 0    Sig: TAKE ONE TABLET BY MOUTH UP TO EVERY 8 HOURS AS NEEDED. START WITH ONE TABLET AT BEDTIME AS NEEDED DUE TO SEDATION.      Not Delegated - Analgesics:  Muscle Relaxants Failed - 11/01/2020  9:17 AM      Failed - This refill cannot be delegated      Passed - Valid encounter within last 6 months    Recent Outpatient Visits           6 days ago Right-sided Bell's palsy   [facility], [place], NP   1 week ago Mid back pain   [facility], Ranell Patrick, MD   1 month ago GAD (generalized anxiety disorder)   [facility], Delfino Lovett, NP   2 months ago Itchy skin   [facility], [place], NP   6 months ago Pain in joint of right shoulder   [facility], Delfino Lovett, NP       Future Appointments             In 1 week Icard, Octavio Graves, DO [facility]   In 3 months Derek Jack, MD [facility]                 Requested Prescriptions  Pending Prescriptions Disp Refills   cyclobenzaprine (FLEXERIL) 5 MG tablet [Pharmacy Med Name: CYCLOBENZAPRINE HCL 5 MG TAB] 15 tablet 0    Sig: TAKE ONE TABLET BY MOUTH UP TO EVERY 8 HOURS AS NEEDED. START WITH ONE TABLET AT BEDTIME AS NEEDED DUE TO SEDATION.      Not Delegated - Analgesics:  Muscle Relaxants Failed - 11/01/2020  9:17 AM      Failed - This refill cannot be delegated      Passed - Valid encounter within last 6 months    Recent Outpatient Visits           6 days ago Right-sided Bell's palsy   [facility], [place],  NP   1 week ago Mid back pain   [facility], Ranell Patrick, MD   1 month ago GAD (generalized anxiety disorder)   [facility], Delfino Lovett, NP   2 months ago Itchy skin   [facility], [place], NP   6 months ago Pain in joint of right shoulder   [facility], Delfino Lovett, NP       Future Appointments             In 1 week Icard, Octavio Graves, DO [facility]   In 3 months Derek Jack, MD [facility]

## 2020-11-01 NOTE — Telephone Encounter (Signed)
Patient is requesting a refill of the following medications: Requested Prescriptions   Pending Prescriptions Disp Refills  . cyclobenzaprine (FLEXERIL) 5 MG tablet 15 tablet 0    Sig: 1 pill by mouth up to every 8 hours as needed. Start with one pill by mouth each bedtime as needed due to sedation  . FLUoxetine (PROZAC) 10 MG capsule 90 capsule 0    Sig: Take 1 capsule (10 mg total) by mouth daily. After 2 weeks, increase to 2 capsules (20mg  total) by mouth daily.  Marland Kitchen gabapentin (NEURONTIN) 300 MG capsule 180 capsule 1    Sig: TAKE ONE CAPSULE (300 MG TOTAL) BY MOUTHTWO TIMES DAILY.    Date of patient request: 10/31/2020 Last office visit: 10/26/2020 Date of last refill: 09/01/21, 09/14/20, 10/22/20 Last refill amount: 15, 90, 180

## 2020-11-01 NOTE — Telephone Encounter (Signed)
Requested medication (s) are due for refill today - yes  Requested medication (s) are on the active medication list -yes  Future visit scheduled -no  Last refill: 09/14/21  Notes to clinic: Rx may need new directions for current dosing- sent for review  Requested Prescriptions  Pending Prescriptions Disp Refills   FLUoxetine (PROZAC) 10 MG capsule [Pharmacy Med Name: FLUOXETINE HCL 10 MG CAP] 90 capsule 0    Sig: TAKE ONE CAPSULE (10MG  TOTAL) BY MOUTH DAILY, AFTER TWO WEEKS, INCREASE TO TWO CAPSULES (20MG  TOTAL) BY MOUTH DAILY      Psychiatry:  Antidepressants - SSRI Passed - 11/01/2020  9:17 AM      Passed - Valid encounter within last 6 months    Recent Outpatient Visits           6 days ago Right-sided Bell's palsy   [facility], [place], NP   1 week ago Mid back pain   [facility], Ranell Patrick, MD   1 month ago GAD (generalized anxiety disorder)   [facility], Delfino Lovett, NP   2 months ago Itchy skin   [facility], [place], NP   6 months ago Pain in joint of right shoulder   [facility], Delfino Lovett, NP       Future Appointments             In 1 week Icard, Octavio Graves, DO [facility]   In 3 months Derek Jack, MD [facility]                 Requested Prescriptions  Pending Prescriptions Disp Refills   FLUoxetine (PROZAC) 10 MG capsule [Pharmacy Med Name: FLUOXETINE HCL 10 MG CAP] 90 capsule 0    Sig: TAKE ONE CAPSULE (10MG  TOTAL) BY MOUTH DAILY, AFTER TWO WEEKS, INCREASE TO TWO CAPSULES (20MG  TOTAL) BY MOUTH DAILY      Psychiatry:  Antidepressants - SSRI Passed - 11/01/2020  9:17 AM      Passed - Valid encounter within last 6 months    Recent Outpatient Visits           6 days ago Right-sided Bell's palsy   [facility], Delfino Lovett, NP   1 week ago Mid back pain   [facility], Ranell Patrick, MD   1 month ago GAD  (generalized anxiety disorder)   [facility], Delfino Lovett, NP   2 months ago Itchy skin   [facility], [place], NP   6 months ago Pain in joint of right shoulder   [facility], Delfino Lovett, NP       Future Appointments             In 1 week Icard, Octavio Graves, DO [facility]   In 3 months Derek Jack, MD [facility]

## 2020-11-03 MED ORDER — FLUOXETINE HCL 10 MG PO CAPS: 10.0000 mg | ORAL_CAPSULE | Freq: Every day | ORAL | 0 refills | Status: DC

## 2020-11-03 MED ORDER — CYCLOBENZAPRINE HCL 5 MG PO TABS
ORAL_TABLET | ORAL | 0 refills | Status: DC
Start: 2020-11-03 — End: 2020-11-22

## 2020-11-03 MED ORDER — GABAPENTIN 300 MG PO CAPS: ORAL_CAPSULE | ORAL | 1 refills | Status: DC

## 2020-11-10 ENCOUNTER — Encounter: Payer: Self-pay | Admitting: Family Medicine

## 2020-11-11 ENCOUNTER — Ambulatory Visit: Payer: PPO | Admitting: Pulmonary Disease

## 2020-11-11 NOTE — Progress Notes (Signed)
PCCM:  Thanks for seeing   Garner Nash, DO [facility] 11/11/2020 5:32 PM

## 2020-11-15 ENCOUNTER — Ambulatory Visit: Payer: PPO | Admitting: Pulmonary Disease

## 2020-11-15 ENCOUNTER — Other Ambulatory Visit: Payer: Self-pay

## 2020-11-15 ENCOUNTER — Encounter: Payer: Self-pay | Admitting: Pulmonary Disease

## 2020-11-15 VITALS — BP 140/80 | HR 84 | Temp 97.3°F | Ht 65.0 in | Wt 143.5 lb

## 2020-11-15 DIAGNOSIS — Z720 Tobacco use: Secondary | ICD-10-CM

## 2020-11-15 DIAGNOSIS — R0600 Dyspnea, unspecified: Secondary | ICD-10-CM | POA: Diagnosis not present

## 2020-11-15 DIAGNOSIS — Z716 Tobacco abuse counseling: Secondary | ICD-10-CM

## 2020-11-15 DIAGNOSIS — F17210 Nicotine dependence, cigarettes, uncomplicated: Secondary | ICD-10-CM

## 2020-11-15 DIAGNOSIS — J449 Chronic obstructive pulmonary disease, unspecified: Secondary | ICD-10-CM | POA: Diagnosis not present

## 2020-11-15 DIAGNOSIS — R0609 Other forms of dyspnea: Secondary | ICD-10-CM

## 2020-11-15 DIAGNOSIS — J438 Other emphysema: Secondary | ICD-10-CM

## 2020-11-15 DIAGNOSIS — J432 Centrilobular emphysema: Secondary | ICD-10-CM

## 2020-11-15 MED ORDER — ALBUTEROL SULFATE (2.5 MG/3ML) 0.083% IN NEBU: 2.5000 mg | INHALATION_SOLUTION | RESPIRATORY_TRACT | 5 refills | Status: DC | PRN

## 2020-11-15 MED ORDER — BREZTRI AEROSPHERE 160-9-4.8 MCG/ACT IN AERO: 2.0000 | INHALATION_SPRAY | Freq: Two times a day (BID) | RESPIRATORY_TRACT | 12 refills | Status: DC

## 2020-11-15 NOTE — Progress Notes (Signed)
Smoking Cessation Counseling:   The patient's current tobacco use: 0.5 ppd, highest 1 pdd, started age 65, 54 years smoking  The patient was advised to quit and impact of smoking on their health.  I assessed the patient's willingness to attempt to quit. I provided methods and skills for cessation. We reviewed medication management of smoking session drugs if appropriate. Resources to help quit smoking were provided. A smoking cessation quit date was set: 12/31/2020 Follow-up was arranged in our clinic.  The amount of time spent counseling patient was 5 mins   Garner Nash, DO [facility] 11/15/2020 11:06 AM

## 2020-11-15 NOTE — Progress Notes (Signed)
Synopsis: Referred in Jan 2020 for COPD evaluation by [name], NP  Subjective:   [name] GENDER: female DOB: 08-24-1956, MRN: [id number]  Chief Complaint  Patient presents with  . Follow-up    Breathing has been doing well.  She has 3 slipped discs in her back.      This is a 65 year old female with a past medical history of significant tobacco abuse.  She has smoked since she was a teenager, highest pack-year smoking at 2 packs/day throughout her 99s and 44s.  Currently smoking 1 pack/day.  She has been told many times that she needs to quit but she gets stressed and she feels like smoking helps decrease this anxiety and stress.  At this point she is very limited on her physical activity.  She is only able to walk short distances.  She no longer goes to the grocery store or goes to [employer].  She is unable to walk around the store and push a grocery cart.  She is also unable to climb stairs.  She avoids them at any cost because she is too dyspneic with any significant exertion.  She has not had any recent chest imaging.  She has not had full PFTs ever before.  She has had office spirometry at her primary care doctor's office and was told she had COPD.  She was started on Spiriva Respimat 1.25 and has not noticed much difference in her breathing.  She has had breathing treatments in the past which have made her shortness of breath much better.  She denies hemoptysis.  She does have daily cough.  OV 11/15/2020: Patient here today for follow-up of ongoing tobacco abuse.  COPD.  From a respiratory standpoint she still has shortness of breath and dyspnea on exertion.  Compliant with her current inhaler regimen.  Denies fevers chills night sweats weight loss.  Patient was counseled on smoking cessation.  Please see separate documentation.   Past Medical History:  Diagnosis Date  . Anemia 04/30/2016  . Anxiety   . Blood transfusion without reported diagnosis   .  Cardiomyopathy ([place])    a. EF 40-45% by echo in 04/2016 b. Improved to 60-65% by repeat imaging in 2018  . CHF (congestive heart failure) ([place])   . COPD (chronic obstructive pulmonary disease) ([place])   . Coronary artery calcification seen on CT scan   . Essential hypertension   . GERD (gastroesophageal reflux disease)   . History of bronchitis   . History of GI bleed   . Hyperlipidemia   . Iron deficiency anemia 05/12/2016   Bleeding ulcers  . Leukocytosis 03/23/2020  . Pollen allergy   . PSVT (paroxysmal supraventricular tachycardia) ([place])   . PVC's (premature ventricular contractions)   . Thrombocytosis 03/23/2020  . Vitamin B12 deficiency 03/23/2020     Family History  Adopted: Yes  Problem Relation Age of Onset  . Hypertension Father   . Coronary artery disease Sister   . Ulcers Maternal Grandmother   . Colon cancer Neg Hx        unknown, was adopted     Past Surgical History:  Procedure Laterality Date  . ABDOMINAL HYSTERECTOMY     polyps  about age 40  . Barbie Banner OSTEOTOMY Right 07/10/2013   Procedure: Barbie Banner OSTEOTOMY RIGHT FOOT;  Surgeon: Marcheta Grammes, DPM;  Location: [facility];  Service: Orthopedics;  Laterality: Right;  . AMPUTATION Left 05/09/2017   Procedure: AMPUTATION 5TH TOE LEFT FOOT;  Surgeon:  [name], DPM;  Location: [facility];  Service: Podiatry;  Laterality: Left;  . AMPUTATION Left 06/13/2017   Procedure: AMPUTATION 2ND TOE LEFT FOOT;  Surgeon: [name], DPM;  Location: [facility];  Service: Podiatry;  Laterality: Left;  . BUNIONECTOMY Right 07/10/2013   Procedure: VOGLER BUNIONECTOMY RIGHT FOOT;  Surgeon: Marcheta Grammes, DPM;  Location: [facility];  Service: Orthopedics;  Laterality: Right;  . CHOLECYSTECTOMY N/A 08/22/2017   Procedure: LAPAROSCOPIC CHOLECYSTECTOMY;  Surgeon: Virl Cagey, MD;  Location: [facility];  Service: General;  Laterality: N/A;  . COLONOSCOPY N/A 07/07/2016   Dr. Oneida Alar; redundant left colon, diverticulosis at  hepatic flexure, non-bleeding internal hemorrhoids  . ESOPHAGOGASTRODUODENOSCOPY N/A 07/07/2016   Dr. Oneida Alar: many non-bleeding cratered gastric ulcers without stigmata of bleeding in gastric antrum. four 2-3 mm angioectasias without bleeding in duodenal bulb and second portion of duodenum s/p APC. Chroni gastritis on path.   . ESOPHAGOGASTRODUODENOSCOPY N/A 08/03/2017   Dr. Oneida Alar: erosive gastritis, AVMs. Found a single non-bleeding angioectasia in stomach, s/p APC therapy. Four non-bleeding angioectasias in duodenum s/p APC. Non-bleeding erosive gastropathy  . IR ANGIOGRAM EXTREMITY LEFT  04/24/2017  . IR FEM POP ART ATHERECT INC PTA MOD SED  04/24/2017  . IR INFUSION THROMBOL ARTERIAL INITIAL (MS)  04/24/2017  . IR RADIOLOGIST EVAL & MGMT  12/05/2016  . IR US GUIDE VASC ACCESS RIGHT  04/24/2017  . LIVER BIOPSY N/A 08/22/2017   Procedure: LIVER BIOPSY;  Surgeon: Virl Cagey, MD;  Location: [facility];  Service: General;  Laterality: N/A;  . METATARSAL HEAD EXCISION Right 07/10/2013   Procedure: METATARSAL HEAD RESECTION OF DIGITS 2 AND 3 RIGHT FOOT;  Surgeon: Marcheta Grammes, DPM;  Location: [facility];  Service: Orthopedics;  Laterality: Right;  . PROXIMAL INTERPHALANGEAL FUSION (PIP) Right 07/10/2013   Procedure: ARTHRODESIS PIPJ  2ND DIGIT RIGHT FOOT;  Surgeon: Marcheta Grammes, DPM;  Location: [facility];  Service: Orthopedics;  Laterality: Right;    Social History   Socioeconomic History  . Marital status: Married    Spouse name: [name]  . Number of children: 1  . Years of education: 69  . Highest education level: Not on file  Occupational History  . Occupation: disabled    Comment: heart  Tobacco Use  . Smoking status: Light Tobacco Smoker    Packs/day: 0.25    Years: 44.00    Pack years: 11.00    Types: Cigarettes    Start date: 05/10/1975  . Smokeless tobacco: Never Used  . Tobacco comment: 2 cigarettes per day 12/10/2019  Vaping Use  . Vaping Use: Former  Substance and  Sexual Activity  . Alcohol use: Not Currently    Alcohol/week: 2.0 standard drinks    Types: 2 Glasses of wine per week  . Drug use: No  . Sexual activity: Yes    Birth control/protection: Surgical  Other Topics Concern  . Not on file  Social History Narrative   Disabled   Lives with husband [name]   Two dogs   Social Determinants of Health   Financial Resource Strain: Not on file  Food Insecurity: Not on file  Transportation Needs: Not on file  Physical Activity: Not on file  Stress: Not on file  Social Connections: Not on file  Intimate Partner Violence: Not At Risk  . Fear of Current or Ex-Partner: No  . Emotionally Abused: No  . Physically Abused: No  . Sexually Abused: No     Allergies  Allergen Reactions  .  Bee Venom Swelling  . Codeine Anaphylaxis    Tongue swelled  . Penicillins Swelling and Rash    Has patient had a PCN reaction causing immediate rash, facial/tongue/throat swelling, SOB or lightheadedness with hypotension: No, delayed Has patient had a PCN reaction causing severe rash involving mucus membranes or skin necrosis: No Has patient had a PCN reaction that required hospitalization No Has patient had a PCN reaction occurring within the last 10 years: No If all of the above answers are "NO", then may proceed with Cephalosporin use.   . Bupropion Other (See Comments)    "Made my skin crawl"  . Sudafed [Pseudoephedrine Hcl] Rash     Outpatient Medications Prior to Visit  Medication Sig Dispense Refill  . acetaminophen (TYLENOL) 500 MG tablet Take 500 mg by mouth at bedtime as needed for moderate pain.     Marland Kitchen albuterol (PROVENTIL) (2.5 MG/3ML) 0.083% nebulizer solution Take 3 mLs (2.5 mg total) by nebulization every 4 (four) hours as needed for wheezing or shortness of breath. 75 mL 5  . ALPRAZolam (XANAX) 0.5 MG tablet TAKE 1 TABLET BY MOUTH AT BEDTIME AS NEEDED FOR ANXIETY 30 tablet 0  . aspirin 81 MG chewable tablet Chew 81 mg by mouth every morning.      Marland Kitchen atorvastatin (LIPITOR) 20 MG tablet TAKE ONE TABLET (20MG TOTAL) BY MOUTH DAILY 90 tablet 3  . bisoprolol (ZEBETA) 10 MG tablet TAKE ONE (1) TABLET BY MOUTH EVERY DAY 90 tablet 3  . Budeson-Glycopyrrol-Formoterol (BREZTRI AEROSPHERE) 160-9-4.8 MCG/ACT AERO Inhale 2 puffs into the lungs 2 (two) times daily. 11.8 g 12  . Cholecalciferol (VITAMIN D) 50 MCG (2000 UT) CAPS Take by mouth daily.    . Continuous Blood Gluc Receiver (FREESTYLE LIBRE 14 DAY READER) DEVI See admin instructions. 4 each 0  . Continuous Blood Gluc Sensor (FREESTYLE LIBRE 14 DAY SENSOR) MISC Inject 1 each into the skin See admin instructions. 4 each 0  . Continuous Blood Gluc Sensor (FREESTYLE LIBRE 2 SENSOR) MISC 1 each by Does not apply route daily. 1 each 0  . cyanocobalamin (,VITAMIN B-12,) 1000 MCG/ML injection Inject 1 mL (1,000 mcg total) into the muscle every 30 (thirty) days. 1 mL 11  . cyclobenzaprine (FLEXERIL) 5 MG tablet 1 pill by mouth up to every 8 hours as needed. Start with one pill by mouth each bedtime as needed due to sedation 15 tablet 0  . dicyclomine (BENTYL) 10 MG capsule Take 1 capsule (10 mg total) by mouth as needed for spasms (30 mins before meals and at bedtime as needed for diarrhea and abdominal cramps.). Take no more than four times daily but take only as needed 120 capsule 1  . empagliflozin (JARDIANCE) 10 MG TABS tablet Take 10 mg by mouth daily before breakfast. 30 tablet 4  . FLUoxetine (PROZAC) 10 MG capsule Take 1 capsule (10 mg total) by mouth daily. After 2 weeks, increase to 2 capsules (35m total) by mouth daily. 90 capsule 0  . gabapentin (NEURONTIN) 300 MG capsule TAKE ONE CAPSULE (300 MG TOTAL) BY MOUTHTWO TIMES DAILY. 180 capsule 1  . glucose blood (ONETOUCH ULTRA) test strip Use as instructed 100 each 12  . glucose monitoring kit (FREESTYLE) monitoring kit Use to check blood sugar BID as directed 1 each 0  . lactase (LACTAID) 3000 units tablet Take by mouth as needed.    . Lancets  (ONETOUCH DELICA PLUS [id number] MISC 1 each by Does not apply route 2 (two) times a day.  100 each 3  . losartan (COZAAR) 100 MG tablet TAKE ONE (1) TABLET BY MOUTH EVERY DAY 90 tablet 3  . metFORMIN (GLUCOPHAGE) 1000 MG tablet Take 1 tablet (1,000 mg total) by mouth 2 (two) times daily with a meal. (Patient taking differently: Take 500 mg by mouth 2 (two) times daily with a meal. Takes 500 mg twice daily.  Total of 1000 mg daily.) 180 tablet 3  . Misc. Devices MISC Please provide supplies (needle, syringe, alcohol swabs) needed for patient to self-administer B-12 injections monthly. 1 each 12  . pantoprazole (PROTONIX) 40 MG tablet TAKE ONE (1) TABLET 30 MINS BEFORE YOUR FIRST MEAL. 90 tablet 3  . predniSONE (DELTASONE) 20 MG tablet Take 20 mg by mouth daily with breakfast.    . spironolactone (ALDACTONE) 25 MG tablet Take 1 tablet (25 mg total) by mouth daily. 90 tablet 3  . ULTICARE TUBERCULIN SAFETY SYR 25G X 1" 1 ML MISC USE TO ADMINISTER INJECTABLE VITAMIN B12. 1 each 11  . valACYclovir (VALTREX) 500 MG tablet Take 500 mg by mouth 2 (two) times daily.     No facility-administered medications prior to visit.    Review of Systems  Constitutional: Negative for chills, fever, malaise/fatigue and weight loss.  HENT: Negative for hearing loss, sore throat and tinnitus.   Eyes: Negative for blurred vision and double vision.  Respiratory: Positive for cough, sputum production, shortness of breath and wheezing. Negative for hemoptysis and stridor.   Cardiovascular: Negative for chest pain, palpitations, orthopnea, leg swelling and PND.  Gastrointestinal: Negative for abdominal pain, constipation, diarrhea, heartburn, nausea and vomiting.  Genitourinary: Negative for dysuria, hematuria and urgency.  Musculoskeletal: Negative for joint pain and myalgias.  Skin: Negative for itching and rash.  Neurological: Negative for dizziness, tingling, weakness and headaches.  Endo/Heme/Allergies: Negative for  environmental allergies. Does not bruise/bleed easily.  Psychiatric/Behavioral: Negative for depression. The patient is not nervous/anxious and does not have insomnia.   All other systems reviewed and are negative.    Objective:  Physical Exam Vitals reviewed.  Constitutional:      General: She is not in acute distress.    Appearance: She is well-developed.  HENT:     Head: Normocephalic and atraumatic.     Mouth/Throat:     Mouth: Oropharynx is clear and moist.     Pharynx: No oropharyngeal exudate.  Eyes:     Extraocular Movements: EOM normal.     Conjunctiva/sclera: Conjunctivae normal.     Pupils: Pupils are equal, round, and reactive to light.  Neck:     Vascular: No JVD.     Trachea: No tracheal deviation.     Comments: Loss of supraclavicular fat Cardiovascular:     Rate and Rhythm: Normal rate and regular rhythm.     Pulses: Intact distal pulses.     Heart sounds: S1 normal and S2 normal.     Comments: Distant heart tones Pulmonary:     Effort: No tachypnea or accessory muscle usage.     Breath sounds: No stridor. Decreased breath sounds (throughout all lung fields) present. No wheezing, rhonchi or rales.     Comments: Increased AP chest diameter Abdominal:     General: Bowel sounds are normal. There is no distension.     Palpations: Abdomen is soft.     Tenderness: There is no abdominal tenderness.  Musculoskeletal:        General: Deformity (muscle wasting ) present. No edema.  Skin:    General: Skin  is warm and dry.     Capillary Refill: Capillary refill takes less than 2 seconds.     Findings: No rash.  Neurological:     Mental Status: She is alert and oriented to person, place, and time.  Psychiatric:        Mood and Affect: Mood and affect normal.        Behavior: Behavior normal.      Vitals:   11/15/20 1045  BP: 140/80  Pulse: 84  Temp: (!) 97.3 F (36.3 C)  TempSrc: Tympanic  SpO2: 96%  Weight: 143 lb 8 oz (65.1 kg)  Height: _0  (1.651  m)   96% on RA BMI Readings from Last 3 Encounters:  11/15/20 23.88 kg/m  10/26/20 23.86 kg/m  10/22/20 23.96 kg/m   Wt Readings from Last 3 Encounters:  11/15/20 143 lb 8 oz (65.1 kg)  10/26/20 143 lb 6.4 oz (65 kg)  10/22/20 144 lb (65.3 kg)     CBC    Component Value Date/Time   WBC 15.0 (H) 09/28/2020 0949   RBC 4.58 09/28/2020 0949   HGB 14.7 09/28/2020 0949   HGB 15.5 09/01/2020 1510   HCT 45.3 09/28/2020 0949   HCT 45.1 09/01/2020 1510   PLT 365 09/28/2020 0949   PLT 370 09/01/2020 1510   MCV 98.9 09/28/2020 0949   MCV 94 09/01/2020 1510   MCH 32.1 09/28/2020 0949   MCHC 32.5 09/28/2020 0949   RDW 13.1 09/28/2020 0949   RDW 12.8 09/01/2020 1510   LYMPHSABS 2.3 09/28/2020 0949   LYMPHSABS 1.8 07/02/2019 1539   MONOABS 1.7 (H) 09/28/2020 0949   EOSABS 0.2 09/28/2020 0949   EOSABS 0.1 07/02/2019 1539   BASOSABS 0.1 09/28/2020 0949   BASOSABS 0.1 07/02/2019 1539    Chest Imaging: CT chest 2017: No evidence of pulmonary embolism, cardiomegaly, mild pulmonary edema No pulmonary nodules. The patient's images have been independently reviewed by me.  CT chest 12/17/2019 lung cancer screening: Lung RADS 2 three-vessel coronary disease evidence of emphysema centrilobular and paraseptal no concerning nodule. The patient's images have been independently reviewed by me.    Pulmonary Functions Testing Results: PFT Results Latest Ref Rng & Units 11/01/2018  FVC-Predicted Pre % 76  FVC-Post L 2.07  FVC-Predicted Post % 65  Pre FEV1/FVC % % 65  Post FEV1/FCV % % 74  FEV1-Pre L 1.57  FEV1-Predicted Pre % 64  FEV1-Post L 1.52  DLCO uncorrected ml/min/mmHg 11.79  DLCO [facility]% % 51  DLVA Predicted % 65  TLC L 4.12  TLC % Predicted % 84  RV % Predicted % 90    FeNO: None   Pathology: None   Echocardiogram:  Preserved EF - 65-70%   Heart Catheterization: None     Assessment & Plan:   Chronic obstructive pulmonary disease, unspecified COPD type  (HCC)  Encounter for smoking cessation counseling  Tobacco abuse  DOE (dyspnea on exertion)  Cigarette smoker  Centrilobular emphysema (HCC)  Paraseptal emphysema (HCC)  Discussion:  65 year old female COPD currently on triple therapy inhaler.  She overall is doing well with this.  Currently enrolled in our lung cancer screening program repeat CT planned for March 2022.  Plan: Refills today of inhalers Refills of albuterol Continue annual screening for lung cancer. Patient was counseled on smoking cessation today. Continue vitamin D supplementation    Current Outpatient Medications:  .  acetaminophen (TYLENOL) 500 MG tablet, Take 500 mg by mouth at bedtime as needed for moderate pain. ,  Disp: , Rfl:  .  albuterol (PROVENTIL) (2.5 MG/3ML) 0.083% nebulizer solution, Take 3 mLs (2.5 mg total) by nebulization every 4 (four) hours as needed for wheezing or shortness of breath., Disp: 75 mL, Rfl: 5 .  ALPRAZolam (XANAX) 0.5 MG tablet, TAKE 1 TABLET BY MOUTH AT BEDTIME AS NEEDED FOR ANXIETY, Disp: 30 tablet, Rfl: 0 .  aspirin 81 MG chewable tablet, Chew 81 mg by mouth every morning. , Disp: , Rfl:  .  atorvastatin (LIPITOR) 20 MG tablet, TAKE ONE TABLET (20MG TOTAL) BY MOUTH DAILY, Disp: 90 tablet, Rfl: 3 .  bisoprolol (ZEBETA) 10 MG tablet, TAKE ONE (1) TABLET BY MOUTH EVERY DAY, Disp: 90 tablet, Rfl: 3 .  Budeson-Glycopyrrol-Formoterol (BREZTRI AEROSPHERE) 160-9-4.8 MCG/ACT AERO, Inhale 2 puffs into the lungs 2 (two) times daily., Disp: 11.8 g, Rfl: 12 .  Cholecalciferol (VITAMIN D) 50 MCG (2000 UT) CAPS, Take by mouth daily., Disp: , Rfl:  .  Continuous Blood Gluc Receiver (FREESTYLE LIBRE 42 DAY READER) DEVI, See admin instructions., Disp: 4 each, Rfl: 0 .  Continuous Blood Gluc Sensor (FREESTYLE LIBRE 14 DAY SENSOR) MISC, Inject 1 each into the skin See admin instructions., Disp: 4 each, Rfl: 0 .  Continuous Blood Gluc Sensor (FREESTYLE LIBRE 2 SENSOR) MISC, 1 each by Does not  apply route daily., Disp: 1 each, Rfl: 0 .  cyanocobalamin (,VITAMIN B-12,) 1000 MCG/ML injection, Inject 1 mL (1,000 mcg total) into the muscle every 30 (thirty) days., Disp: 1 mL, Rfl: 11 .  cyclobenzaprine (FLEXERIL) 5 MG tablet, 1 pill by mouth up to every 8 hours as needed. Start with one pill by mouth each bedtime as needed due to sedation, Disp: 15 tablet, Rfl: 0 .  dicyclomine (BENTYL) 10 MG capsule, Take 1 capsule (10 mg total) by mouth as needed for spasms (30 mins before meals and at bedtime as needed for diarrhea and abdominal cramps.). Take no more than four times daily but take only as needed, Disp: 120 capsule, Rfl: 1 .  empagliflozin (JARDIANCE) 10 MG TABS tablet, Take 10 mg by mouth daily before breakfast., Disp: 30 tablet, Rfl: 4 .  FLUoxetine (PROZAC) 10 MG capsule, Take 1 capsule (10 mg total) by mouth daily. After 2 weeks, increase to 2 capsules (33m total) by mouth daily., Disp: 90 capsule, Rfl: 0 .  gabapentin (NEURONTIN) 300 MG capsule, TAKE ONE CAPSULE (300 MG TOTAL) BY MOUTHTWO TIMES DAILY., Disp: 180 capsule, Rfl: 1 .  glucose blood (ONETOUCH ULTRA) test strip, Use as instructed, Disp: 100 each, Rfl: 12 .  glucose monitoring kit (FREESTYLE) monitoring kit, Use to check blood sugar BID as directed, Disp: 1 each, Rfl: 0 .  lactase (LACTAID) 3000 units tablet, Take by mouth as needed., Disp: , Rfl:  .  Lancets (ONETOUCH DELICA PLUS [id number] MISC, 1 each by Does not apply route 2 (two) times a day., Disp: 100 each, Rfl: 3 .  losartan (COZAAR) 100 MG tablet, TAKE ONE (1) TABLET BY MOUTH EVERY DAY, Disp: 90 tablet, Rfl: 3 .  metFORMIN (GLUCOPHAGE) 1000 MG tablet, Take 1 tablet (1,000 mg total) by mouth 2 (two) times daily with a meal. (Patient taking differently: Take 500 mg by mouth 2 (two) times daily with a meal. Takes 500 mg twice daily.  Total of 1000 mg daily.), Disp: 180 tablet, Rfl: 3 .  Misc. Devices MISC, Please provide supplies (needle, syringe, alcohol swabs) needed  for patient to self-administer B-12 injections monthly., Disp: 1 each, Rfl: 12 .  pantoprazole (  PROTONIX) 40 MG tablet, TAKE ONE (1) TABLET 30 MINS BEFORE YOUR FIRST MEAL., Disp: 90 tablet, Rfl: 3 .  predniSONE (DELTASONE) 20 MG tablet, Take 20 mg by mouth daily with breakfast., Disp: , Rfl:  .  spironolactone (ALDACTONE) 25 MG tablet, Take 1 tablet (25 mg total) by mouth daily., Disp: 90 tablet, Rfl: 3 .  ULTICARE TUBERCULIN SAFETY SYR 25G X 1" 1 ML MISC, USE TO ADMINISTER INJECTABLE VITAMIN B12., Disp: 1 each, Rfl: 11 .  valACYclovir (VALTREX) 500 MG tablet, Take 500 mg by mouth 2 (two) times daily., Disp: , Rfl:    Garner Nash, DO  Pulmonary Critical Care 11/15/2020 11:05 AM

## 2020-11-15 NOTE — Patient Instructions (Addendum)
Thank you for visiting Dr. [name] at [facility]. Today we recommend the following:  Meds ordered this encounter  Medications  . albuterol (PROVENTIL) (2.5 MG/3ML) 0.083% nebulizer solution    Sig: Take 3 mLs (2.5 mg total) by nebulization every 4 (four) hours as needed for wheezing or shortness of breath.    Dispense:  75 mL    Refill:  5    Dx: J44.9  . Budeson-Glycopyrrol-Formoterol (BREZTRI AEROSPHERE) 160-9-4.8 MCG/ACT AERO    Sig: Inhale 2 puffs into the lungs 2 (two) times daily.    Dispense:  11.8 g    Refill:  12    Order Specific Question:   Lot Number?    Answer:   [id number]    Order Specific Question:   Expiration Date?    Answer:   05/01/2021    Order Specific Question:   Manufacturer?    Answer:   [employer] [[id number]]   Return in about 6 months (around 05/15/2021) for with APP or Dr. [name].    Please do your part to reduce the spread of COVID-19.   You must quit smoking or vaping. This is the single most important thing that you can do to improve your lung health.   S = Set a quit date. T = Tell family, friends, and the people around you that you plan to quit. A = Anticipate or plan ahead for the tough times you'll face while quitting. R = Remove cigarettes and other tobacco products from your home, car, and work T = Talk to [place] about getting help to quit  If you need help feel free to reach out to our office, [place] [facility]ess[facility]: (226) 367-3790, call 1-800-QUIT-NOW, or visit [email or web address].[email or web address]/.    Smoking Tobacco Information, Adult Smoking tobacco can be harmful to your health. Tobacco contains a poisonous (toxic), colorless chemical called nicotine. Nicotine is addictive. It changes the brain and can make it hard to stop smoking. Tobacco also has other toxic chemicals that can hurt your body and raise your risk of many cancers. How can smoking tobacco affect me? Smoking tobacco puts you at risk for:  Cancer. Smoking is most  commonly associated with lung cancer, but can also lead to cancer in other parts of the body.  Chronic obstructive pulmonary disease (COPD). This is a long-term lung condition that makes it hard to breathe. It also gets worse over time.  High blood pressure (hypertension), heart disease, stroke, or heart attack.  Lung infections, such as pneumonia.  Cataracts. This is when the lenses in the eyes become clouded.  Digestive problems. This may include peptic ulcers, heartburn, and gastroesophageal reflux disease (GERD).  Oral health problems, such as gum disease and tooth loss.  Loss of taste and smell. Smoking can affect your appearance by causing:  Wrinkles.  Yellow or stained teeth, fingers, and fingernails. Smoking tobacco can also affect your social life, because:  It may be challenging to find places to smoke when away from home. Many workplaces, [employer], hotels, and public places are tobacco-free.  Smoking is expensive. This is due to the cost of tobacco and the long-term costs of treating health problems from smoking.  Secondhand smoke may affect those around you. Secondhand smoke can cause lung cancer, breathing problems, and heart disease. Children of smokers have a higher risk for: ? Sudden infant death syndrome (SIDS). ? Ear infections. ? Lung infections. If you currently smoke tobacco, quitting now can help you:  Lead a longer and healthier  life.  Look, smell, breathe, and feel better over time.  Save money.  Protect others from the harms of secondhand smoke. What actions can I take to prevent health problems? Quit smoking  Do not start smoking. Quit if you already do.  Make a plan to quit smoking and commit to it. Look for programs to help you and ask your health care provider for recommendations and ideas.  Set a date and write down all the reasons you want to quit.  Let your friends and family know you are quitting so they can help and support you.  Consider finding friends who also want to quit. It can be easier to quit with someone else, so that you can support each other.  Talk with your health care provider about using nicotine replacement medicines to help you quit, such as gum, lozenges, patches, sprays, or pills.  Do not replace cigarette smoking with electronic cigarettes, which are commonly called e-cigarettes. The safety of e-cigarettes is not known, and some may contain harmful chemicals.  If you try to quit but return to smoking, stay positive. It is common to slip up when you first quit, so take it one day at a time.  Be prepared for cravings. When you feel the urge to smoke, chew gum or suck on hard candy.   Lifestyle  Stay busy and take care of your body.  Drink enough fluid to keep your urine pale yellow.  Get plenty of exercise and eat a healthy diet. This can help prevent weight gain after quitting.  Monitor your eating habits. Quitting smoking can cause you to have a larger appetite than when you smoke.  Find ways to relax. Go out with friends or family to a movie or a restaurant where people do not smoke.  Ask your health care provider about having regular tests (screenings) to check for cancer. This may include blood tests, imaging tests, and other tests.  Find ways to manage your stress, such as meditation, yoga, or exercise. Where to find support To get support to quit smoking, consider:  Asking your health care provider for more information and resources.  Taking classes to learn more about quitting smoking.  Looking for local organizations that offer resources about quitting smoking.  Joining a support group for people who want to quit smoking in your local community.  Calling the smokefree.gov counselor helpline: 1-800-Quit-Now 782-374-5165) Where to find more information You may find more information about quitting smoking from:  HelpGuide.org: www.helpguide.org  https://hall.com/:  smokefree.gov  American Lung Association: www.lung.org Contact a health care provider if you:  Have problems breathing.  Notice that your lips, nose, or fingers turn blue.  Have chest pain.  Are coughing up blood.  Feel faint or you pass out.  Have other health changes that cause you to worry. Summary  Smoking tobacco can negatively affect your health, the health of those around you, your finances, and your social life.  Do not start smoking. Quit if you already do. If you need help quitting, ask your health care provider.  Think about joining a support group for people who want to quit smoking in your local community. There are many effective programs that will help you to quit this behavior. This information is not intended to replace advice given to you by your health care provider. Make sure you discuss any questions you have with your health care provider. Document Revised: 06/13/2019 Document Reviewed: 10/03/2016 Elsevier Patient Education  2021 [employer].

## 2020-11-17 ENCOUNTER — Encounter: Payer: Self-pay | Admitting: Registered Nurse

## 2020-11-17 ENCOUNTER — Other Ambulatory Visit (HOSPITAL_COMMUNITY)
Admission: RE | Admit: 2020-11-17 | Discharge: 2020-11-17 | Disposition: A | Discharge status: Home / Self Care | Payer: PPO | Source: Ambulatory Visit | Attending: "Endocrinology | Admitting: "Endocrinology

## 2020-11-17 DIAGNOSIS — E278 Other specified disorders of adrenal gland: Secondary | ICD-10-CM | POA: Insufficient documentation

## 2020-11-19 ENCOUNTER — Other Ambulatory Visit: Payer: Self-pay | Admitting: Registered Nurse

## 2020-11-19 DIAGNOSIS — F411 Generalized anxiety disorder: Secondary | ICD-10-CM

## 2020-11-19 DIAGNOSIS — M5442 Lumbago with sciatica, left side: Secondary | ICD-10-CM

## 2020-11-19 DIAGNOSIS — M549 Dorsalgia, unspecified: Secondary | ICD-10-CM

## 2020-11-19 DIAGNOSIS — M5441 Lumbago with sciatica, right side: Secondary | ICD-10-CM

## 2020-11-19 NOTE — Telephone Encounter (Signed)
Refill? Last filled 10/19/20 #30  of Xanax.

## 2020-11-19 NOTE — Telephone Encounter (Signed)
Requested medication (s) are due for refill today: Yes  Requested medication (s) are on the active medication list: Yes  Last refill:  10/19/20, 11/03/20  Future visit scheduled: No  Notes to clinic:  Unable to refill per protocol, cannot delegate.      Requested Prescriptions  Pending Prescriptions Disp Refills   cyclobenzaprine (FLEXERIL) 5 MG tablet [Pharmacy Med Name: CYCLOBENZAPRINE HCL 5 MG TAB] 15 tablet 0    Sig: TAKE ONE TABLET BY MOUTH UP TO EVERY 8 HOURS AS NEEDED. START WITH ONE TABLET AT BEDTIME AS NEEDED DUE TO SEDATION.      Not Delegated - Analgesics:  Muscle Relaxants Failed - 11/19/2020 11:54 AM      Failed - This refill cannot be delegated      Passed - Valid encounter within last 6 months    Recent Outpatient Visits           3 weeks ago Right-sided Bell's palsy   [facility], [place], NP   4 weeks ago Mid back pain   [facility], Ranell Patrick, MD   2 months ago GAD (generalized anxiety disorder)   [facility], Delfino Lovett, NP   2 months ago Itchy skin   [facility], [place], NP   7 months ago Pain in joint of right shoulder   [facility], Delfino Lovett, NP       Future Appointments             In 2 months Derek Jack, MD [place]               ALPRAZolam Duanne Moron) 0.5 MG tablet [Pharmacy Med Name: ALPRAZOLAM 0.5 MG TAB] 30 tablet     Sig: TAKE 1 TABLET BY MOUTH AT BEDTIME AS NEEDED FOR ANXIETY      Not Delegated - Psychiatry:  Anxiolytics/Hypnotics Failed - 11/19/2020 11:54 AM      Failed - This refill cannot be delegated      Failed - Urine Drug Screen completed in last 360 days      Passed - Valid encounter within last 6 months    Recent Outpatient Visits           3 weeks ago Right-sided Bell's palsy   [facility], Delfino Lovett, NP   4 weeks ago Mid back pain   [facility], Ranell Patrick, MD   2 months ago  GAD (generalized anxiety disorder)   [facility], Delfino Lovett, NP   2 months ago Itchy skin   [facility], [place], NP   7 months ago Pain in joint of right shoulder   [facility], Delfino Lovett, NP       Future Appointments             In 2 months Derek Jack, MD [facility]

## 2020-11-22 ENCOUNTER — Other Ambulatory Visit: Payer: Self-pay | Admitting: Registered Nurse

## 2020-11-22 ENCOUNTER — Other Ambulatory Visit: Payer: Self-pay | Admitting: Emergency Medicine

## 2020-11-22 ENCOUNTER — Telehealth: Payer: Self-pay | Admitting: Registered Nurse

## 2020-11-22 DIAGNOSIS — F411 Generalized anxiety disorder: Secondary | ICD-10-CM

## 2020-11-22 DIAGNOSIS — E1140 Type 2 diabetes mellitus with diabetic neuropathy, unspecified: Secondary | ICD-10-CM

## 2020-11-22 DIAGNOSIS — M5442 Lumbago with sciatica, left side: Secondary | ICD-10-CM

## 2020-11-22 DIAGNOSIS — M549 Dorsalgia, unspecified: Secondary | ICD-10-CM

## 2020-11-22 MED ORDER — BLOOD GLUCOSE METER KIT: PACK | 0 refills | Status: DC

## 2020-11-22 NOTE — Telephone Encounter (Signed)
Patient is requesting a refill of the following medications: Requested Prescriptions   Pending Prescriptions Disp Refills   cyclobenzaprine (FLEXERIL) 5 MG tablet 15 tablet 0    Sig: 1 pill by mouth up to every 8 hours as needed. Start with one pill by mouth each bedtime as needed due to sedation   ALPRAZolam (XANAX) 0.5 MG tablet 30 tablet 0    Sig: TAKE 1 TABLET BY MOUTH AT BEDTIME AS NEEDED FOR ANXIETY    Date of patient request: 11/22/20 Last office visit: 10/26/20 Date of last refill: 10/19/20 and 11/03/20 Last refill amount:  Follow up time period per chart:

## 2020-11-22 NOTE — Telephone Encounter (Signed)
Left message on patient's voicemail to schedule an appointment for med refill. Awaiting callback.

## 2020-11-22 NOTE — Telephone Encounter (Signed)
Pt is calling to request a new script for Continuous Blood Gluc Receiver (FREESTYLE LIBRE II  complete system. Pt states that the freestyle Elenor Legato I is being discontinued. Please advise [phone] Preferred Pharmacy- [place], [place]

## 2020-11-22 NOTE — Telephone Encounter (Signed)
Requested medication (s) are due for refill today: yes  Requested medication (s) are on the active medication list: yes  Last refill: 10/19/2020  Future visit scheduled: No  Notes to clinic:  this refill cannot be delegated    Requested Prescriptions  Pending Prescriptions Disp Refills   cyclobenzaprine (FLEXERIL) 5 MG tablet 15 tablet 0    Sig: 1 pill by mouth up to every 8 hours as needed. Start with one pill by mouth each bedtime as needed due to sedation      Not Delegated - Analgesics:  Muscle Relaxants Failed - 11/22/2020  4:27 PM      Failed - This refill cannot be delegated      Passed - Valid encounter within last 6 months    Recent Outpatient Visits           3 weeks ago Right-sided Bell's palsy   [facility], [place], NP   1 month ago Mid back pain   [facility], Ranell Patrick, MD   2 months ago GAD (generalized anxiety disorder)   [facility], Delfino Lovett, NP   2 months ago Itchy skin   [facility], [place], NP   7 months ago Pain in joint of right shoulder   [facility], Delfino Lovett, NP       Future Appointments             In 2 months Derek Jack, MD [place]               ALPRAZolam (XANAX) 0.5 MG tablet 30 tablet 0    Sig: TAKE 1 TABLET BY MOUTH AT BEDTIME AS NEEDED FOR ANXIETY      Not Delegated - Psychiatry:  Anxiolytics/Hypnotics Failed - 11/22/2020  4:27 PM      Failed - This refill cannot be delegated      Failed - Urine Drug Screen completed in last 360 days      Passed - Valid encounter within last 6 months    Recent Outpatient Visits           3 weeks ago Right-sided Bell's palsy   [facility], Delfino Lovett, NP   1 month ago Mid back pain   [facility], Ranell Patrick, MD   2 months ago GAD (generalized anxiety disorder)   [facility], Delfino Lovett, NP   2 months ago Itchy skin   [facility], [place], NP   7 months ago Pain in joint of right shoulder   [facility], Delfino Lovett, NP       Future Appointments             In 2 months Derek Jack, MD [facility]

## 2020-11-22 NOTE — Telephone Encounter (Signed)
New glucose machine has been faxed to [facility]

## 2020-11-22 NOTE — Telephone Encounter (Signed)
Medication: ALPRAZolam (XANAX) 0.5 MG tablet [[id number]], cyclobenzaprine (FLEXERIL) 5 MG tablet [[id number]] ,   Has the patient contacted their pharmacy? YES  (Agent: If no, request that the patient contact the pharmacy for the refill.) (Agent: If yes, when and what did the pharmacy advise?)  Preferred Pharmacy (with phone number or street name): [facility] [address]  [place], [place] [address]  Phone:  [phone] Fax:  [phone]   Agent: Please be advised that RX refills may take up to 3 business days. We ask that you follow-up with your pharmacy.

## 2020-11-24 NOTE — Telephone Encounter (Signed)
Please Advise

## 2020-11-30 LAB — METANEPHRINES, PLASMA
Metanephrine, Free: 65.8 pg/mL (ref 0.0–88.0)
Normetanephrine, Free: 109.1 pg/mL (ref 0.0–285.2)

## 2020-12-02 ENCOUNTER — Other Ambulatory Visit (HOSPITAL_COMMUNITY): Payer: Self-pay | Admitting: Hematology

## 2020-12-03 ENCOUNTER — Encounter: Payer: Self-pay | Admitting: Registered Nurse

## 2020-12-03 NOTE — Progress Notes (Signed)
Established Patient Office Visit  Subjective:  [name], adult    DOB: [birth year]  Age: 65 y.o. MRN: [id number]  CC:  Chief Complaint  Patient presents with  . right elbow redness and sore    a couple of months.   . Medication Refill  . Medical Management of Chronic Issues    HPI Tammy Mcpherson presents for management of chronic conditions, med refills, R elbow soreness.  Last A1c:  Lab Results  Component Value Date   HGBA1C 8.6 (A) 09/01/2020    Currently taking: jardiance 12m, metformin 5049mPO bid ac No new complications Reports good compliance with medications Diet has been up and down Exercise habits have been limited  Elbow pain Ongoing for a few months Soreness waxes and wanes No swelling heat or redness Notes this mostly on lateral aspect No acute injury   Past Medical History:  Diagnosis Date  . Anemia 04/30/2016  . Anxiety   . Blood transfusion without reported diagnosis   . Cardiomy[place] ([facility]   a. EF 40-45% by echo in 04/2016 b. Improved to 60-65% by repeat imaging in 2018  . CH[place] ([place]estive heart failure) ([place]  . COPD (ch[place] obstructive pulmonary disease) ([place]  . Coronary artery calcification seen on CT scan   . Essential hypertension   . GERD (gastroesophageal reflux disease)   . History of bronchitis   . History of GI bleed   . Hyperlipidemia   . Iron deficiency anemia 05/12/2016   Bleeding ulcers  . Leukocytosis 03/23/2020  . Pollen allergy   . PSVT (paroxysmal supraventricular tachycardia) ([place]  . PVC's (premature ventricular contractions)   . Thrombocytosis 03/23/2020  . Vitamin B12 deficiency 03/23/2020    Past Surgical History:  Procedure Laterality Date  . ABDOMINAL HYSTERECTOMY     polyps  about age 65. AIBarbie BannerSTEOTOMY Right 07/10/2013   Procedure: AIBarbie BannerSTEOTOMY RIGHT FOOT;  Surgeon: BeMarcheta GrammesDPM;  Location: [facility];  Service: Orthopedics;  Laterality: Right;  . AMPUTATION Left  05/09/2017   Procedure: AMPUTATION 5TH TOE LEFT FOOT;  Surgeon: McCaprice BeaverDPM;  Location: [facility];  Service: Podiatry;  Laterality: Left;  . AMPUTATION Left 06/13/2017   Procedure: AMPUTATION 2ND TOE LEFT FOOT;  Surgeon: McCaprice BeaverDPM;  Location: [facility];  Service: Podiatry;  Laterality: Left;  . BUNIONECTOMY Right 07/10/2013   Procedure: VOGLER BUNIONECTOMY RIGHT FOOT;  Surgeon: BeMarcheta GrammesDPM;  Location: [facility];  Service: Orthopedics;  Laterality: Right;  . CHOLECYSTECTOMY N/A 08/22/2017   Procedure: LAPAROSCOPIC CHOLECYSTECTOMY;  Surgeon: BrVirl CageyMD;  Location: [facility];  Service: General;  Laterality: N/A;  . COLONOSCOPY N/A 07/07/2016   Dr. FiOneida Alarredundant left colon, diverticulosis at hepatic flexure, non-bleeding internal hemorrhoids  . ESOPHAGOGASTRODUODENOSCOPY N/A 07/07/2016   Dr. FiOneida Alarmany non-bleeding cratered gastric ulcers without stigmata of bleeding in gastric antrum. four 2-3 mm angioectasias without bleeding in duodenal bulb and second portion of duodenum s/p APC. Chroni gastritis on path.   . ESOPHAGOGASTRODUODENOSCOPY N/A 08/03/2017   Dr. FiOneida Alarerosive gastritis, AVMs. Found a single non-bleeding angioectasia in stomach, s/p APC therapy. Four non-bleeding angioectasias in duodenum s/p APC. Non-bleeding erosive gastropathy  . IR ANGIOGRAM EXTREMITY LEFT  04/24/2017  . IR FEM POP ART ATHERECT INC PTA MOD SED  04/24/2017  . IR INFUSION THROMBOL ARTERIAL INITIAL (MS)  04/24/2017  . IR RADIOLOGIST EVAL & MGMT  12/05/2016  . IR [place]IDE VASC  ACCESS RIGHT  04/24/2017  . LIVER BIOPSY N/A 08/22/2017   Procedure: LIVER BIOPSY;  Surgeon: Virl Cagey, MD;  Location: [facility];  Service: General;  Laterality: N/A;  . METATARSAL HEAD EXCISION Right 07/10/2013   Procedure: METATARSAL HEAD RESECTION OF DIGITS 2 AND 3 RIGHT FOOT;  Surgeon: Marcheta Grammes, DPM;  Location: [facility];  Service: Orthopedics;  Laterality: Right;  . PROXIMAL  INTERPHALANGEAL FUSION (PIP) Right 07/10/2013   Procedure: ARTHRODESIS PIPJ  2ND DIGIT RIGHT FOOT;  Surgeon: Marcheta Grammes, DPM;  Location: [facility];  Service: Orthopedics;  Laterality: Right;    Family History  Adopted: Yes  Problem Relation Age of Onset  . Hypertension Father   . Coronary artery disease Sister   . Ulcers Maternal Grandmother   . Colon cancer Neg Hx        unknown, was adopted    Social History   Socioeconomic History  . Marital status: Married    Spouse name: [name]  . Number of children: 1  . Years of education: 32  . Highest education level: Not on file  Occupational History  . Occupation: disabled    Comment: heart  Tobacco Use  . Smoking status: Light Tobacco Smoker    Packs/day: 0.25    Years: 44.00    Pack years: 11.00    Types: Cigarettes    Start date: 05/10/1975  . Smokeless tobacco: Never Used  . Tobacco comment: 2 cigarettes per day 12/10/2019  Vaping Use  . Vaping Use: Former  Substance and Sexual Activity  . Alcohol use: Not Currently    Alcohol/week: 2.0 standard drinks    Types: 2 Glasses of wine per week  . Drug use: No  . Sexual activity: Yes    Birth control/protection: Surgical  Other Topics Concern  . Not on file  Social History Narrative   Disabled   Lives with husband [name]   Two dogs   Social Determinants of Health   Financial Resource Strain: Not on file  Food Insecurity: Not on file  Transportation Needs: Not on file  Physical Activity: Not on file  Stress: Not on file  Social Connections: Not on file  Intimate Partner Violence: Not At Risk  . Fear of Current or Ex-Partner: No  . Emotionally Abused: No  . Physically Abused: No  . Sexually Abused: No    Outpatient Medications Prior to Visit  Medication Sig Dispense Refill  . acetaminophen (TYLENOL) 500 MG tablet Take 500 mg by mouth at bedtime as needed for moderate pain.     Marland Kitchen aspirin 81 MG chewable tablet Chew 81 mg by mouth every morning.     Marland Kitchen  atorvastatin (LIPITOR) 20 MG tablet TAKE ONE TABLET (20MG TOTAL) BY MOUTH DAILY 90 tablet 3  . Cholecalciferol (VITAMIN D) 50 MCG (2000 UT) CAPS Take by mouth daily.    Marland Kitchen dicyclomine (BENTYL) 10 MG capsule Take 1 capsule (10 mg total) by mouth as needed for spasms (30 mins before meals and at bedtime as needed for diarrhea and abdominal cramps.). Take no more than four times daily but take only as needed 120 capsule 1  . empagliflozin (JARDIANCE) 10 MG TABS tablet Take 10 mg by mouth daily before breakfast. 30 tablet 4  . glucose blood (ONETOUCH ULTRA) test strip Use as instructed 100 each 12  . glucose monitoring kit (FREESTYLE) monitoring kit Use to check blood sugar BID as directed 1 each 0  . lactase (LACTAID) 3000 units tablet Take  by mouth as needed.    . Lancets (ONETOUCH DELICA PLUS [id number]) MISC 1 each by Does not apply route 2 (two) times a day. 100 each 3  . metFORMIN (GLUCOPHAGE) 1000 MG tablet Take 1 tablet (1,000 mg total) by mouth 2 (two) times daily with a meal. (Patient taking differently: Take 500 mg by mouth 2 (two) times daily with a meal. Takes 500 mg twice daily.  Total of 1000 mg daily.) 180 tablet 3  . Misc. Devices MISC Please provide supplies (needle, syringe, alcohol swabs) needed for patient to self-administer B-12 injections monthly. 1 each 12  . albuterol (PROVENTIL) (2.5 MG/3ML) 0.083% nebulizer solution Take 3 mLs (2.5 mg total) by nebulization every 4 (four) hours as needed for wheezing or shortness of breath. 75 mL 5  . bisoprolol (ZEBETA) 10 MG tablet TAKE ONE (1) TABLET BY MOUTH EVERY DAY 90 tablet 3  . Budeson-Glycopyrrol-Formoterol (BREZTRI AEROSPHERE) 160-9-4.8 MCG/ACT AERO Inhale 2 puffs into the lungs 2 (two) times daily. 11.8 g 0  . Continuous Blood Gluc Receiver (FREESTYLE LIBRE 14 DAY READER) DEVI See admin instructions. 6 each 1  . Continuous Blood Gluc Sensor (FREESTYLE LIBRE 14 DAY SENSOR) MISC USE AS DIRECTED 6 each 0  . cyanocobalamin (,VITAMIN B-12,)  1000 MCG/ML injection Inject 1 mL (1,000 mcg total) into the muscle every 30 (thirty) days. 1 mL 11  . dicyclomine (BENTYL) 10 MG capsule Take 10 mg by mouth 4 (four) times daily -  before meals and at bedtime.    . gabapentin (NEURONTIN) 300 MG capsule TAKE ONE CAPSULE (300 MG TOTAL) BY MOUTHTWO TIMES DAILY. 180 capsule 0  . losartan (COZAAR) 100 MG tablet TAKE ONE (1) TABLET BY MOUTH EVERY DAY 90 tablet 3  . pantoprazole (PROTONIX) 40 MG tablet TAKE ONE (1) TABLET 30 MINS BEFORE YOUR FIRST MEAL. 90 tablet 3  . potassium chloride (KLOR-CON) 10 MEQ tablet Take 1 tablet (10 mEq total) by mouth daily. 90 tablet 3  . ULTICARE TUBERCULIN SAFETY SYR 25G X 1" 1 ML MISC USE TO ADMINISTER INJECTABLE VITAMIN B12. 1 each 11  . SHINGRIX injection      No facility-administered medications prior to visit.    Allergies  Allergen Reactions  . Bee Venom Swelling  . Codeine Anaphylaxis    Tongue swelled  . Penicillins Swelling and Rash    Has patient had a PCN reaction causing immediate rash, facial/tongue/throat swelling, SOB or lightheadedness with hypotension: No, delayed Has patient had a PCN reaction causing severe rash involving mucus membranes or skin necrosis: No Has patient had a PCN reaction that required hospitalization No Has patient had a PCN reaction occurring within the last 10 years: No If all of the above answers are "NO", then may proceed with Cephalosporin use.   . Bupropion Other (See Comments)    "Made my skin crawl"  . Sudafed [Pseudoephedrine Hcl] Rash    ROS Review of Systems Per hpi     Objective:    Physical Exam Constitutional:      General: She is not in acute distress.    Appearance: Normal appearance. She is normal weight. She is not ill-appearing, toxic-appearing or diaphoretic.  Cardiovascular:     Rate and Rhythm: Normal rate and regular rhythm.     Heart sounds: Normal heart sounds. No murmur heard. No friction rub. No gallop.   Pulmonary:     Effort:  Pulmonary effort is normal. No respiratory distress.     Breath sounds: Normal breath sounds.  No stridor. No wheezing, rhonchi or rales.  Chest:     Chest wall: No tenderness.  Musculoskeletal:        General: Tenderness (lateral epicondyle) present. No swelling. Normal range of motion.  Skin:    General: Skin is warm and dry.  Neurological:     General: No focal deficit present.     Mental Status: She is alert and oriented to person, place, and time. Mental status is at baseline.  Psychiatric:        Mood and Affect: Mood normal.        Behavior: Behavior normal.        Thought Content: Thought content normal.        Judgment: Judgment normal.     BP (!) 180/84   Pulse 74   Temp 97.8 F (36.6 C) (Temporal)   Ht '5\' 5"'  (1.651 m)   Wt 145 lb 3.2 oz (65.9 kg)   SpO2 96%   BMI 24.16 kg/m  Wt Readings from Last 3 Encounters:  11/15/20 143 lb 8 oz (65.1 kg)  10/26/20 143 lb 6.4 oz (65 kg)  10/22/20 144 lb (65.3 kg)     Health Maintenance Due  Topic Date Due  . OPHTHALMOLOGY EXAM  Never done  . COVID-19 Vaccine (1) Never done  . PAP SMEAR-Modifier  Never done    There are no preventive care reminders to display for this patient.  Lab Results  Component Value Date   TSH 1.727 04/30/2016   Lab Results  Component Value Date   WBC 15.0 (H) 09/28/2020   HGB 14.7 09/28/2020   HCT 45.3 09/28/2020   MCV 98.9 09/28/2020   PLT 365 09/28/2020   Lab Results  Component Value Date   NA 135 09/28/2020   K 3.7 09/28/2020   CO2 24 09/28/2020   GLUCOSE 223 (H) 09/28/2020   BUN 11 09/28/2020   CREATININE 0.59 09/28/2020   BILITOT 0.6 09/28/2020   ALKPHOS 98 09/28/2020   AST 15 09/28/2020   ALT 18 09/28/2020   PROT 7.3 09/28/2020   ALBUMIN 3.9 09/28/2020   CALCIUM 9.3 09/28/2020   ANIONGAP 9 09/28/2020   Lab Results  Component Value Date   CHOL 202 (H) 09/01/2020   Lab Results  Component Value Date   HDL 31 (L) 09/01/2020   Lab Results  Component Value Date    LDLCALC 113 (H) 09/01/2020   Lab Results  Component Value Date   TRIG 335 (H) 09/01/2020   Lab Results  Component Value Date   CHOLHDL 6.5 (H) 09/01/2020   Lab Results  Component Value Date   HGBA1C 8.6 (A) 09/01/2020      Assessment & Plan:   Problem List Items Addressed This Visit      Endocrine   Diabetes mellitus ([place])   Relevant Orders   POCT glycosylated hemoglobin (Hb A1C) (Completed)    Other Visit Diagnoses    Itchy skin    -  Primary   Uncontrolled type 2 diabetes mellitus with hyperglycemia (HCC)       Relevant Orders   POCT glycosylated hemoglobin (Hb A1C) (Completed)   Comprehensive metabolic panel (Completed)   CBC (Completed)   Lipid panel (Completed)      Meds ordered this encounter  Medications  . DISCONTD: Continuous Blood Gluc Sensor (FREESTYLE LIBRE 14 DAY SENSOR) MISC    Sig: Inject 1 each into the skin See admin instructions.    Dispense:  6 each    Refill:  1  . DISCONTD: gabapentin (NEURONTIN) 300 MG capsule    Sig: TAKE ONE CAPSULE (300 MG TOTAL) BY MOUTHTWO TIMES DAILY.    Dispense:  180 capsule    Refill:  1  . DISCONTD: triamcinolone (KENALOG) 0.1 %    Sig: Apply 1 application topically 2 (two) times daily.    Dispense:  30 g    Refill:  0    Order Specific Question:   Supervising Provider    Answer:   Carlota Raspberry, JEFFREY R [[id number]]    Follow-up: No follow-ups on file.    PLAN  Refill triamcinolone  Start constant glucose monitoring d/t continued elevation of A1c. Can consider endo referral in future.  Will try gabapentin and nonpharm for pain. Discussed RICE method  Patient encouraged to call clinic with any questions, comments, or concerns.  [name], NP

## 2020-12-06 ENCOUNTER — Ambulatory Visit: Payer: PPO | Admitting: "Endocrinology

## 2020-12-07 ENCOUNTER — Other Ambulatory Visit: Payer: Self-pay

## 2020-12-07 ENCOUNTER — Ambulatory Visit: Payer: PPO | Admitting: "Endocrinology

## 2020-12-07 ENCOUNTER — Encounter: Payer: Self-pay | Admitting: "Endocrinology

## 2020-12-07 VITALS — BP 145/78 | HR 76 | Ht 65.0 in | Wt 143.6 lb

## 2020-12-07 DIAGNOSIS — E278 Other specified disorders of adrenal gland: Secondary | ICD-10-CM

## 2020-12-07 MED ORDER — SPIRONOLACTONE 50 MG PO TABS: 50.0000 mg | ORAL_TABLET | Freq: Every day | ORAL | 1 refills | Status: DC

## 2020-12-07 NOTE — Progress Notes (Signed)
12/07/2020, 5:11 PM  Endocrinology follow-up note   Subjective:    [name], adult    DOB: 06/28/1956, PCP [name], NP   Past Medical History:  Diagnosis Date  . Anemia 04/30/2016  . Anxiety   . Blood transfusion without reported diagnosis   . Cardiomyopathy ([place])    a. EF 40-45% by echo in 04/2016 b. Improved to 60-65% by repeat imaging in 2018  . CHF (congestive heart failure) ([place])   . COPD (chronic obstructive pulmonary disease) ([place])   . Coronary artery calcification seen on CT scan   . Essential hypertension   . GERD (gastroesophageal reflux disease)   . History of bronchitis   . History of GI bleed   . Hyperlipidemia   . Iron deficiency anemia 05/12/2016   Bleeding ulcers  . Leukocytosis 03/23/2020  . Pollen allergy   . PSVT (paroxysmal supraventricular tachycardia) ([place])   . PVC's (premature ventricular contractions)   . Thrombocytosis 03/23/2020  . Vitamin B12 deficiency 03/23/2020   Past Surgical History:  Procedure Laterality Date  . ABDOMINAL HYSTERECTOMY     polyps  about age 8  . Barbie Banner OSTEOTOMY Right 07/10/2013   Procedure: Barbie Banner OSTEOTOMY RIGHT FOOT;  Surgeon: Marcheta Grammes, DPM;  Location: [facility];  Service: Orthopedics;  Laterality: Right;  . AMPUTATION Left 05/09/2017   Procedure: AMPUTATION 5TH TOE LEFT FOOT;  Surgeon: [name], DPM;  Location: [facility];  Service: Podiatry;  Laterality: Left;  . AMPUTATION Left 06/13/2017   Procedure: AMPUTATION 2ND TOE LEFT FOOT;  Surgeon: [name], DPM;  Location: [facility];  Service: Podiatry;  Laterality: Left;  . BUNIONECTOMY Right 07/10/2013   Procedure: VOGLER BUNIONECTOMY RIGHT FOOT;  Surgeon: Marcheta Grammes, DPM;  Location: [facility];  Service: Orthopedics;  Laterality: Right;  . CHOLECYSTECTOMY N/A 08/22/2017   Procedure: LAPAROSCOPIC CHOLECYSTECTOMY;  Surgeon: Virl Cagey, MD;  Location: [facility];  Service:  General;  Laterality: N/A;  . COLONOSCOPY N/A 07/07/2016   Dr. Oneida Alar; redundant left colon, diverticulosis at hepatic flexure, non-bleeding internal hemorrhoids  . ESOPHAGOGASTRODUODENOSCOPY N/A 07/07/2016   Dr. Oneida Alar: many non-bleeding cratered gastric ulcers without stigmata of bleeding in gastric antrum. four 2-3 mm angioectasias without bleeding in duodenal bulb and second portion of duodenum s/p APC. Chroni gastritis on path.   . ESOPHAGOGASTRODUODENOSCOPY N/A 08/03/2017   Dr. Oneida Alar: erosive gastritis, AVMs. Found a single non-bleeding angioectasia in stomach, s/p APC therapy. Four non-bleeding angioectasias in duodenum s/p APC. Non-bleeding erosive gastropathy  . IR ANGIOGRAM EXTREMITY LEFT  04/24/2017  . IR FEM POP ART ATHERECT INC PTA MOD SED  04/24/2017  . IR INFUSION THROMBOL ARTERIAL INITIAL (MS)  04/24/2017  . IR RADIOLOGIST EVAL & MGMT  12/05/2016  . IR US GUIDE VASC ACCESS RIGHT  04/24/2017  . LIVER BIOPSY N/A 08/22/2017   Procedure: LIVER BIOPSY;  Surgeon: Virl Cagey, MD;  Location: [facility];  Service: General;  Laterality: N/A;  . METATARSAL HEAD EXCISION Right 07/10/2013   Procedure: METATARSAL HEAD RESECTION OF DIGITS 2 AND 3 RIGHT FOOT;  Surgeon: Marcheta Grammes, DPM;  Location: [facility];  Service: Orthopedics;  Laterality: Right;  . PROXIMAL INTERPHALANGEAL FUSION (PIP) Right 07/10/2013   Procedure: ARTHRODESIS PIPJ  2ND DIGIT RIGHT FOOT;  Surgeon: Marcheta Grammes, DPM;  Location: [facility];  Service: Orthopedics;  Laterality: Right;   Social History   Socioeconomic History  . Marital status: Married    Spouse name: [name]  . Number of children: 1  . Years of education: 33  . Highest education level: Not on file  Occupational History  . Occupation: disabled    Comment: heart  Tobacco Use  . Smoking status: Light Tobacco Smoker    Packs/day: 0.25    Years: 44.00    Pack years: 11.00    Types: Cigarettes    Start date: 05/10/1975  . Smokeless tobacco: Never  Used  . Tobacco comment: 2 cigarettes per day 12/10/2019  Vaping Use  . Vaping Use: Former  Substance and Sexual Activity  . Alcohol use: Not Currently    Alcohol/week: 2.0 standard drinks    Types: 2 Glasses of wine per week  . Drug use: No  . Sexual activity: Yes    Birth control/protection: Surgical  Other Topics Concern  . Not on file  Social History Narrative   Disabled   Lives with husband [name]   Two dogs   Social Determinants of Health   Financial Resource Strain: Not on file  Food Insecurity: Not on file  Transportation Needs: Not on file  Physical Activity: Not on file  Stress: Not on file  Social Connections: Not on file   Family History  Adopted: Yes  Problem Relation Age of Onset  . Hypertension Father   . Coronary artery disease Sister   . Ulcers Maternal Grandmother   . Colon cancer Neg Hx        unknown, was adopted   Outpatient Encounter Medications as of 12/07/2020  Medication Sig  . acetaminophen (TYLENOL) 500 MG tablet Take 500 mg by mouth at bedtime as needed for moderate pain.   Marland Kitchen albuterol (PROVENTIL) (2.5 MG/3ML) 0.083% nebulizer solution Take 3 mLs (2.5 mg total) by nebulization every 4 (four) hours as needed for wheezing or shortness of breath.  . ALPRAZolam (XANAX) 0.5 MG tablet TAKE 1 TABLET BY MOUTH AT BEDTIME AS NEEDED FOR ANXIETY  . aspirin 81 MG chewable tablet Chew 81 mg by mouth every morning.   Marland Kitchen atorvastatin (LIPITOR) 20 MG tablet TAKE ONE TABLET (20MG TOTAL) BY MOUTH DAILY  . bisoprolol (ZEBETA) 10 MG tablet TAKE ONE (1) TABLET BY MOUTH EVERY DAY  . blood glucose meter kit and supplies Dispense based on patient and insurance preference. Use up to four times daily as directed. (FOR ICD-10 E10.9, E11.9).  . Budeson-Glycopyrrol-Formoterol (BREZTRI AEROSPHERE) 160-9-4.8 MCG/ACT AERO Inhale 2 puffs into the lungs 2 (two) times daily.  . Cholecalciferol (VITAMIN D) 50 MCG (2000 UT) CAPS Take by mouth daily.  . Continuous Blood Gluc Receiver  (FREESTYLE LIBRE 14 DAY READER) DEVI See admin instructions.  . Continuous Blood Gluc Sensor (FREESTYLE LIBRE 14 DAY SENSOR) MISC Inject 1 each into the skin See admin instructions.  . Continuous Blood Gluc Sensor (FREESTYLE LIBRE 2 SENSOR) MISC 1 each by Does not apply route daily. (Patient not taking: Reported on 12/07/2020)  . cyanocobalamin (,VITAMIN B-12,) 1000 MCG/ML injection INJECT 1 ML INTO THE MUSCLE EVERY 30 DAYS.  Marland Kitchen cyclobenzaprine (FLEXERIL) 5 MG tablet TAKE ONE TABLET BY MOUTH UP TO EVERY 8 HOURS AS NEEDED. START WITH ONE TABLET AT BEDTIME AS NEEDED DUE TO SEDATION.  Marland Kitchen dicyclomine (BENTYL) 10 MG capsule Take 1 capsule (10 mg total) by mouth as needed  for spasms (30 mins before meals and at bedtime as needed for diarrhea and abdominal cramps.). Take no more than four times daily but take only as needed  . empagliflozin (JARDIANCE) 10 MG TABS tablet Take 10 mg by mouth daily before breakfast.  . FLUoxetine (PROZAC) 10 MG capsule Take 1 capsule (10 mg total) by mouth daily. After 2 weeks, increase to 2 capsules (7m total) by mouth daily.  .Marland Kitchengabapentin (NEURONTIN) 300 MG capsule TAKE ONE CAPSULE (300 MG TOTAL) BY MOUTHTWO TIMES DAILY.  .Marland Kitchenglucose blood (ONETOUCH ULTRA) test strip Use as instructed  . glucose monitoring kit (FREESTYLE) monitoring kit Use to check blood sugar BID as directed  . lactase (LACTAID) 3000 units tablet Take by mouth as needed.  . Lancets (ONETOUCH DELICA PLUS [id number] MISC 1 each by Does not apply route 2 (two) times a day.  . losartan (COZAAR) 100 MG tablet TAKE ONE (1) TABLET BY MOUTH EVERY DAY  . metFORMIN (GLUCOPHAGE) 1000 MG tablet Take 1 tablet (1,000 mg total) by mouth 2 (two) times daily with a meal. (Patient taking differently: Take 500 mg by mouth 2 (two) times daily with a meal. Takes 500 mg twice daily.  Total of 1000 mg daily.)  . Misc. Devices MISC Please provide supplies (needle, syringe, alcohol swabs) needed for patient to self-administer B-12  injections monthly.  . pantoprazole (PROTONIX) 40 MG tablet TAKE ONE (1) TABLET 30 MINS BEFORE YOUR FIRST MEAL.  . predniSONE (DELTASONE) 20 MG tablet Take 20 mg by mouth daily with breakfast.  . spironolactone (ALDACTONE) 50 MG tablet Take 1 tablet (50 mg total) by mouth daily.  .Marland KitchenULTICARE TUBERCULIN SAFETY SYR 25G X 1" 1 ML MISC USE TO ADMINISTER INJECTABLE VITAMIN B12.  . valACYclovir (VALTREX) 500 MG tablet Take 500 mg by mouth 2 (two) times daily.  . [DISCONTINUED] spironolactone (ALDACTONE) 25 MG tablet Take 1 tablet (25 mg total) by mouth daily.   No facility-administered encounter medications on file as of 12/07/2020.   ALLERGIES: Allergies  Allergen Reactions  . Bee Venom Swelling  . Codeine Anaphylaxis    Tongue swelled  . Penicillins Swelling and Rash    Has patient had a PCN reaction causing immediate rash, facial/tongue/throat swelling, SOB or lightheadedness with hypotension: No, delayed Has patient had a PCN reaction causing severe rash involving mucus membranes or skin necrosis: No Has patient had a PCN reaction that required hospitalization No Has patient had a PCN reaction occurring within the last 10 years: No If all of the above answers are "NO", then may proceed with Cephalosporin use.   . Bupropion Other (See Comments)    "Made my skin crawl"  . Sudafed [Pseudoephedrine Hcl] Rash    VACCINATION STATUS: Immunization History  Administered Date(s) Administered  . Influenza,inj,Quad PF,6+ Mos 07/04/2017, 07/10/2018, 07/02/2019, 07/07/2020  . Pneumococcal Polysaccharide-23 11/01/2018    HPI Tammy MONTELONGOis 65y.o. adult who presents to follow-up after she was seen in consultation for incidental finding of bilateral adrenal hyperplasia  requested by her PCP   MMaximiano Coss NP.  See notes from prior visit. -Her previous work-up did not show adrenal overcirculation off hormones.  She has chronically uncontrolled hypertension currently on losartan and recently  initiated spironolactone.  On June 17, 2020 she underwent abdominal/pelvic CT scan with contrast for work-up of diarrhea and epigastric pain.  Incidentally she was found to have vaguely described thickening of the adrenal glands most consistent with hyperplasia, no enhancing lesions ,  no discrete  adenoma/mass lesion.  Of note, in 2017 abdominal imaging did not mention the adrenal hyperplasia. Her previsit 24-hour urine studies reveal mildly elevated 24-hour urine dopamine at 626, 24-hour urine metanephrines slightly elevated at 255.  Normal urine free cortisol in 24 hours. Her previsit plasma metanephrines were also within normal limits.   She has type 2 diabetes on treatment with  Jardiance, and Metformin by her PMD.  Her most recent A1c was 8.7% in March 2021. She has beta-blocker and ARB for blood pressure management. She is a chronic heavy smoker with COPD on bronchodilators.  She denies any family history of adrenal, thyroid, pituitary dysfunction. She denies any history of difficult to control hypertension, diaphoresis, nor paroxysmal headaches. She reports fluctuating body weight, currently weighs 145 pounds with BMI of 24.9.  Review of Systems  Limited as above.  Objective:    Vitals with BMI 12/07/2020 11/15/2020 10/26/2020  Height '5\' 5"'  '5\' 5"'  '5\' 5"'   Weight 143 lbs 10 oz 143 lbs 8 oz 143 lbs 6 oz  BMI 23.9 16.01 09.32  Systolic 355 732 202  Diastolic 78 80 70  Pulse 76 84 90    BP (!) 145/78   Pulse 76   Ht '5\' 5"'  (1.651 m)   Wt 143 lb 9.6 oz (65.1 kg)   BMI 23.90 kg/m   Wt Readings from Last 3 Encounters:  12/07/20 143 lb 9.6 oz (65.1 kg)  11/15/20 143 lb 8 oz (65.1 kg)  10/26/20 143 lb 6.4 oz (65 kg)    Physical Exam  CMP ( most recent) CMP     Component Value Date/Time   NA 135 09/28/2020 0949   NA 141 09/01/2020 1510   K 3.7 09/28/2020 0949   CL 102 09/28/2020 0949   CO2 24 09/28/2020 0949   GLUCOSE 223 (H) 09/28/2020 0949   BUN 11 09/28/2020 0949    BUN 7 (L) 09/01/2020 1510   CREATININE 0.59 09/28/2020 0949   CREATININE 0.73 09/28/2017 1201   CALCIUM 9.3 09/28/2020 0949   PROT 7.3 09/28/2020 0949   PROT 7.1 09/01/2020 1510   ALBUMIN 3.9 09/28/2020 0949   ALBUMIN 4.2 09/01/2020 1510   AST 15 09/28/2020 0949   ALT 18 09/28/2020 0949   ALKPHOS 98 09/28/2020 0949   BILITOT 0.6 09/28/2020 0949   BILITOT 0.3 09/01/2020 1510   GFRNONAA >60 09/28/2020 0949   GFRNONAA 90 09/28/2017 1201   GFRAA 115 09/01/2020 1510   GFRAA 104 09/28/2017 1201     Diabetic Labs (most recent): Lab Results  Component Value Date   HGBA1C 8.6 (A) 09/01/2020   HGBA1C 8.7 (A) 12/23/2019   HGBA1C 7.0 (H) 10/13/2019     Lipid Panel ( most recent) Lipid Panel     Component Value Date/Time   CHOL 202 (H) 09/01/2020 1510   TRIG 335 (H) 09/01/2020 1510   HDL 31 (L) 09/01/2020 1510   CHOLHDL 6.5 (H) 09/01/2020 1510   CHOLHDL 4.4 09/28/2017 1201   VLDL NOT CALC 03/30/2017 1520   LDLCALC 113 (H) 09/01/2020 1510   LDLCALC 88 09/28/2017 1201   LABVLDL 58 (H) 09/01/2020 1510      Lab Results  Component Value Date   TSH 1.727 04/30/2016    Recent Results (from the past 2160 hour(s))  CBC with Differential     Status: Abnormal   Collection Time: 09/28/20  9:49 AM  Result Value Ref Range   WBC 15.0 (H) 4.0 - 10.5 K/uL   RBC 4.58 3.87 - 5.11  MIL/uL   Hemoglobin 14.7 12.0 - 15.0 g/dL   HCT 45.3 36.0 - 46.0 %   MCV 98.9 80.0 - 100.0 fL   MCH 32.1 26.0 - 34.0 pg   MCHC 32.5 30.0 - 36.0 g/dL   RDW 13.1 11.5 - 15.5 %   Platelets 365 150 - 400 K/uL   nRBC 0.0 0.0 - 0.2 %   Neutrophils Relative % 70 %   Neutro Abs 10.6 (H) 1.7 - 7.7 K/uL   Lymphocytes Relative 15 %   Lymphs Abs 2.3 0.7 - 4.0 K/uL   Monocytes Relative 12 %   Monocytes Absolute 1.7 (H) 0.1 - 1.0 K/uL   Eosinophils Relative 1 %   Eosinophils Absolute 0.2 0.0 - 0.5 K/uL   Basophils Relative 1 %   Basophils Absolute 0.1 0.0 - 0.1 K/uL   Immature Granulocytes 1 %   Abs Immature  Granulocytes 0.11 (H) 0.00 - 0.07 K/uL    Comment: Performed at [facility], [address] [place], [place] [address]  Comprehensive metabolic panel     Status: Abnormal   Collection Time: 09/28/20  9:49 AM  Result Value Ref Range   Sodium 135 135 - 145 mmol/L   Potassium 3.7 3.5 - 5.1 mmol/L   Chloride 102 98 - 111 mmol/L   CO2 24 22 - 32 mmol/L   Glucose, Bld 223 (H) 70 - 99 mg/dL    Comment: Glucose reference range applies only to samples taken after fasting for at least 8 hours.   BUN 11 8 - 23 mg/dL   Creatinine, Ser 0.59 0.44 - 1.00 mg/dL   Calcium 9.3 8.9 - 10.3 mg/dL   Total Protein 7.3 6.5 - 8.1 g/dL   Albumin 3.9 3.5 - 5.0 g/dL   AST 15 15 - 41 U/L   ALT 18 0 - 44 U/L   Alkaline Phosphatase 98 38 - 126 U/L   Total Bilirubin 0.6 0.3 - 1.2 mg/dL   GFR, Estimated >60 >60 mL/min    Comment: (NOTE) Calculated using the CKD-EPI Creatinine Equation (2021)    Anion gap 9 5 - 15    Comment: Performed at [facility], [address], [place], [place] [address]  Lactate dehydrogenase     Status: None   Collection Time: 09/28/20  9:49 AM  Result Value Ref Range   LDH 103 98 - 192 U/L    Comment: Performed at [facility], [address], [place], [place] [address]  Ferritin     Status: None   Collection Time: 09/28/20  9:49 AM  Result Value Ref Range   Ferritin 266 11 - 307 ng/mL    Comment: Performed at [facility], [address], [place], [place] [address]  Iron and TIBC     Status: None   Collection Time: 09/28/20  9:49 AM  Result Value Ref Range   Iron 100 28 - 170 ug/dL   TIBC 360 250 - 450 ug/dL   Saturation Ratios 28 10.4 - 31.8 %   UIBC 260 ug/dL    Comment: Performed at [facility], [address] [place], [place] [address]  Vitamin B12     Status: None   Collection Time: 09/28/20  9:49 AM  Result Value Ref Range   Vitamin B-12 381 180 - 914 pg/mL    Comment: (NOTE) This assay is not validated for testing neonatal or myeloproliferative syndrome specimens for  Vitamin B12 levels. Performed at [facility], [address] [place], [place] [address]  Vitamin D 25 hydroxy     Status: Abnormal   Collection Time: 09/28/20  9:49 AM  Result Value Ref Range   Vit D, 25-Hydroxy 26.1 (L) 30 - 100 ng/mL    Comment: Performed at [employer] (NOTE) Vitamin D deficiency has been defined by the [place] practice guideline as a level of serum 25-OH  vitamin D less than 20 ng/mL (1,2). The Endocrine Society went on to  further define vitamin D insufficiency as a level between 21 and 29  ng/mL (2).  1. IOM (Institute of Medicine). 2010. Dietary reference intakes for  calcium and D. [place]: The [employer]. 2. Holick MF, Binkley Doyline, Bischoff-Ferrari HA, et al. Evaluation,  treatment, and prevention of vitamin D deficiency: an Endocrine  Society clinical practice guideline, JCEM. 2011 Jul; 96(7): 1911-30.  Performed at [facility], [place] [address] [place], [place] [address]   ECHOCARDIOGRAM COMPLETE     Status: None   Collection Time: 10/06/20  3:07 PM  Result Value Ref Range   S' Lateral 3.06 cm   Area-P 1/2 3.93 cm2   Single Plane A2C EF 70.8 %   Single Plane A4C EF 57.0 %   Calc EF 65.3 %   MV M vel 1.75 m/s   MV Peak grad 12.3 mmHg  Metanephrines, plasma     Status: None   Collection Time: 11/17/20 12:38 PM  Result Value Ref Range   Normetanephrine, Free 109.1 0.0 - 285.2 pg/mL    Comment: (NOTE) This test was developed and its performance characteristics determined by [facility]. It has not been cleared or approved by the Food and Drug Administration.    Metanephrine, Free 65.8 0.0 - 88.0 pg/mL    Comment: (NOTE) This test was developed and its performance characteristics determined by [facility]. It has not been cleared or approved by the Food and Drug Administration. Performed At: [facility] [address] [place], [place] [id number] Rush Farmer MD  QZ:[id number]         CT abdomen/pelvis June 17, 2020 -------- Adrenals/urinary tract: Thickening of the adrenal glands most consistent with hyperplasia. No enhancing renal lesion. No renal obstruction. Ureters and bladder normal. ------------  IMPRESSION: 1. No acute findings in the abdomen pelvis. 2. No diverticulosis.  No bowel inflammation. 3. Postcholecystectomy and hysterectomy. 4. Aortic Atherosclerosis (ICD10-I70.0).   Assessment & Plan:   1. Adrenal hyperplasia ([place])  - Based on reviews of her medical records,  she has incidental finding of thickening of bilateral adrenals with no discrete mass lesions or enhancing lesions. -Her previsit 24-hour urine studies are not conclusive for pheochromocytoma.  She does not have hypercortisolemia. She has uncontrolled hypertension, responding better with spironolactone.  I discussed and increase her spironolactone to 50 mg p.o. daily at breakfast. -Her recent chest CT did not include adrenals, patient is scheduled to have regular chest CTs . All of the studies should be adrenal protocol  to characterize the adrenal findings better.  She is extensively counseled against smoking. The patient was counseled on the dangers of tobacco use, and was advised to quit.  Reviewed strategies to maximize success, including removing cigarettes and smoking materials from environment.   She wishes to follow-up with her PMD about diabetes management.  She is currently on Jardiance 10 mg p.o. daily and Metformin 1000 mg p.o. twice daily.  She did not have recent A1c, already has a follow-up appointment with her PMD.  - she  is advised to maintain close follow up with [name], NP for primary care needs.     - Time spent on this patient care encounter:  30 minutes of which 50% was spent in  counseling and the rest reviewing  her current and  previous labs / studies and medications  doses and developing a plan for long term care, and  documenting this care. Suszanne Finch  participated in the discussions, expressed understanding, and voiced agreement with the above plans.  All questions were answered to her satisfaction. she is encouraged to contact clinic should she have any questions or concerns prior to her return visit.   Follow up plan: Return in about 1 year (around 12/07/2021) for F/U with Pre-visit Labs.   Glade Lloyd, MD [facility] [address] [place], [place] [address] Phone: [phone]  Fax: [phone]     12/07/2020, 5:11 PM  This note was partially dictated with voice recognition software. Similar sounding words can be transcribed inadequately or may not  be corrected upon review.

## 2020-12-08 ENCOUNTER — Encounter: Payer: Self-pay | Admitting: Registered Nurse

## 2020-12-08 ENCOUNTER — Ambulatory Visit (INDEPENDENT_AMBULATORY_CARE_PROVIDER_SITE_OTHER): Payer: PPO | Admitting: Registered Nurse

## 2020-12-08 VITALS — BP 171/86 | HR 90 | Temp 98.2°F | Resp 16 | Ht 65.0 in | Wt 143.0 lb

## 2020-12-08 DIAGNOSIS — R233 Spontaneous ecchymoses: Secondary | ICD-10-CM | POA: Diagnosis not present

## 2020-12-08 DIAGNOSIS — T300 Burn of unspecified body region, unspecified degree: Secondary | ICD-10-CM

## 2020-12-08 MED ORDER — SILVER SULFADIAZINE 1 % EX CREA: 1.0000 "application " | TOPICAL_CREAM | Freq: Every day | CUTANEOUS | 0 refills | Status: DC

## 2020-12-08 NOTE — Patient Instructions (Signed)
° ° ° °  If you have lab work done today you will be contacted with your lab results within the next 2 weeks.  If you have not heard from us then please contact us. The fastest way to get your results is to register for My Chart. ° ° °IF you received an x-ray today, you will receive an invoice from [facility]. Please contact [facility] at [phone] with questions or concerns regarding your invoice.  ° °IF you received labwork today, you will receive an invoice from [facility]. Please contact [facility] at [phone] with questions or concerns regarding your invoice.  ° °Our billing staff will not be able to assist you with questions regarding bills from these companies. ° °You will be contacted with the lab results as soon as they are available. The fastest way to get your results is to activate your My Chart account. Instructions are located on the last page of this paperwork. If you have not heard from us regarding the results in 2 weeks, please contact this office. °  ° ° ° °

## 2020-12-09 ENCOUNTER — Encounter: Payer: Self-pay | Admitting: Registered Nurse

## 2020-12-09 DIAGNOSIS — E1165 Type 2 diabetes mellitus with hyperglycemia: Secondary | ICD-10-CM

## 2020-12-10 MED ORDER — FREESTYLE LIBRE 2 SENSOR MISC: 1 refills | Status: DC

## 2020-12-10 MED ORDER — FREESTYLE LIBRE 2 READER DEVI: 0 refills | Status: DC

## 2020-12-14 ENCOUNTER — Other Ambulatory Visit: Payer: Self-pay | Admitting: Pulmonary Disease

## 2020-12-14 DIAGNOSIS — J449 Chronic obstructive pulmonary disease, unspecified: Secondary | ICD-10-CM

## 2020-12-15 ENCOUNTER — Other Ambulatory Visit: Payer: Self-pay | Admitting: Registered Nurse

## 2020-12-15 DIAGNOSIS — E1165 Type 2 diabetes mellitus with hyperglycemia: Secondary | ICD-10-CM

## 2020-12-15 NOTE — Telephone Encounter (Signed)
Notes to clinic: script is expired  Review for refill    Requested Prescriptions  Pending Prescriptions Disp Refills   metFORMIN (GLUCOPHAGE) 1000 MG tablet [Pharmacy Med Name: METFORMIN HCL 1000 MG TAB] 180 tablet 3    Sig: TAKE ONE TABLET (1000MG TOTAL) BY MOUTH TWO TIMES DAILY WITH A MEAL      Endocrinology:  Diabetes - Biguanides Failed - 12/15/2020 10:39 AM      Failed - HBA1C is between 0 and 7.9 and within 180 days    Hemoglobin A1C  Date Value Ref Range Status  09/01/2020 8.6 (A) 4.0 - 5.6 % Final   Hgb A1c MFr Bld  Date Value Ref Range Status  10/13/2019 7.0 (H) 4.8 - 5.6 % Final    Comment:             Prediabetes: 5.7 - 6.4          Diabetes: >6.4          Glycemic control for adults with diabetes: <7.0           Passed - Cr in normal range and within 360 days    Creat  Date Value Ref Range Status  09/28/2017 0.73 0.50 - 0.99 mg/dL Final    Comment:    For patients >77 years of age, the reference limit for Creatinine is approximately 13% higher for people identified as African-American. .    Creatinine, Ser  Date Value Ref Range Status  09/28/2020 0.59 0.44 - 1.00 mg/dL Final   Creatinine, Urine  Date Value Ref Range Status  09/28/2017 58 20 - 275 mg/dL Final          Passed - eGFR in normal range and within 360 days    GFR, Est African American  Date Value Ref Range Status  09/28/2017 104 > OR = 60 mL/min/1.50m Final   GFR calc Af Amer  Date Value Ref Range Status  09/01/2020 115 >59 mL/min/1.73 Final    Comment:    **In accordance with recommendations from the NKF-ASN Task force,**   [facility] is in the process of updating its eGFR calculation to the   2021 CKD-EPI creatinine equation that estimates kidney function   without a race variable.    GFR, Est Non African American  Date Value Ref Range Status  09/28/2017 90 > OR = 60 mL/min/1.761mFinal   GFR, Estimated  Date Value Ref Range Status  09/28/2020 >60 >60 mL/min Final     Comment:    (NOTE) Calculated using the CKD-EPI Creatinine Equation (2021)           Passed - Valid encounter within last 6 months    Recent Outpatient Visits           1 week ago Burn   [facility]   1 month ago Right-sided Bell's palsy   [facility]   1 month ago Mid back pain   [facility]   3 months ago GAD (generalized anxiety disorder)   [facility]   3 months ago Itchy skin   [facility]       Future Appointments             In 2 months [facility]

## 2020-12-15 NOTE — Telephone Encounter (Signed)
   Patient requesting refill on Albuterol HFA Patient's med list only has Albuterol neb solution. Are you ok to fill?  11/15/20 Assessment & Plan:   Chronic obstructive pulmonary disease, unspecified COPD type ([place])  Encounter for smoking cessation counseling  Tobacco abuse  DOE (dyspnea on exertion)  Cigarette smoker  Centrilobular emphysema (HCC)  Paraseptal emphysema (HCC)  Discussion:  65 year old female COPD currently on triple therapy inhaler.  She overall is doing well with this.  Currently enrolled in our lung cancer screening program repeat CT planned for March 2022.  Plan: Refills today of inhalers Refills of albuterol Continue annual screening for lung cancer. Patient was counseled on smoking cessation today. Continue vitamin D supplementation

## 2020-12-21 ENCOUNTER — Other Ambulatory Visit: Payer: Self-pay | Admitting: Registered Nurse

## 2020-12-21 DIAGNOSIS — F411 Generalized anxiety disorder: Secondary | ICD-10-CM

## 2020-12-21 NOTE — Telephone Encounter (Signed)
Requested medication (s) are due for refill today: yes  Requested medication (s) are on the active medication list: yes  Last refill: 11/23/20  Future visit scheduled: no  Notes to clinic: not delegated    Requested Prescriptions  Pending Prescriptions Disp Refills   ALPRAZolam (XANAX) 0.5 MG tablet [Pharmacy Med Name: ALPRAZOLAM 0.5 MG TAB] 30 tablet     Sig: TAKE 1 TABLET BY MOUTH AT BEDTIME AS NEEDED FOR ANXIETY      Not Delegated - Psychiatry:  Anxiolytics/Hypnotics Failed - 12/21/2020 11:31 AM      Failed - This refill cannot be delegated      Failed - Urine Drug Screen completed in last 360 days      Passed - Valid encounter within last 6 months    Recent Outpatient Visits           1 week ago Burn   [facility], Delfino Lovett, NP   1 month ago Right-sided Bell's palsy   [facility], [place], NP   2 months ago Mid back pain   [facility] [place], MD   3 months ago GAD (generalized anxiety disorder)   [facility], Delfino Lovett, NP   3 months ago Itchy skin   [facility], Delfino Lovett, NP       Future Appointments             In 1 month Derek Jack, MD [facility]

## 2020-12-21 NOTE — Telephone Encounter (Signed)
Patient is requesting a refill of the following medications: Requested Prescriptions   Pending Prescriptions Disp Refills  . ALPRAZolam (XANAX) 0.5 MG tablet [Pharmacy Med Name: ALPRAZOLAM 0.5 MG TAB] 30 tablet     Sig: TAKE 1 TABLET BY MOUTH AT BEDTIME AS NEEDED FOR ANXIETY    Date of patient request: 12/20/2020 Last office visit: 12/08/2020 Date of last refill: 11/22/2020 Last refill amount: 30 tablets Follow up time period per chart: None

## 2020-12-22 ENCOUNTER — Other Ambulatory Visit: Payer: Self-pay | Admitting: Registered Nurse

## 2020-12-22 ENCOUNTER — Encounter: Payer: Self-pay | Admitting: Registered Nurse

## 2020-12-22 DIAGNOSIS — F411 Generalized anxiety disorder: Secondary | ICD-10-CM

## 2020-12-23 ENCOUNTER — Other Ambulatory Visit: Payer: Self-pay | Admitting: Registered Nurse

## 2020-12-23 DIAGNOSIS — M549 Dorsalgia, unspecified: Secondary | ICD-10-CM

## 2020-12-23 DIAGNOSIS — M5441 Lumbago with sciatica, right side: Secondary | ICD-10-CM

## 2020-12-23 DIAGNOSIS — M5442 Lumbago with sciatica, left side: Secondary | ICD-10-CM

## 2020-12-23 NOTE — Telephone Encounter (Signed)
Requested medication (s) are due for refill today: yes  Requested medication (s) are on the active medication list: yes  Last refill:  11/22/20 #15 0 refills  Future visit scheduled: no  Notes to clinic:  not delegated per protocol     Requested Prescriptions  Pending Prescriptions Disp Refills   cyclobenzaprine (FLEXERIL) 5 MG tablet [Pharmacy Med Name: CYCLOBENZAPRINE HCL 5 MG TAB] 15 tablet 0    Sig: TAKE ONE TABLET BY MOUTH UP TO EVERY 8 HOURS AS NEEDED. START WITH ONE TABLET AT BEDTIME AS NEEDED DUE TO SEDATION.      Not Delegated - Analgesics:  Muscle Relaxants Failed - 12/23/2020  1:57 PM      Failed - This refill cannot be delegated      Passed - Valid encounter within last 6 months    Recent Outpatient Visits           2 weeks ago Burn   [facility] [place], NP   1 month ago Right-sided Bell's palsy   [facility], Delfino Lovett, NP   2 months ago Mid back pain   [facility] [place], MD   3 months ago GAD (generalized anxiety disorder)   [facility], Delfino Lovett, NP   3 months ago Itchy skin   [facility], Delfino Lovett, NP       Future Appointments             In 1 month Derek Jack, MD [facility]

## 2020-12-24 ENCOUNTER — Encounter: Payer: Self-pay | Admitting: Registered Nurse

## 2020-12-24 NOTE — Telephone Encounter (Signed)
Attempted to call patient about medication cream. Patient cream was filled this month on 12/08/2020.

## 2021-01-10 ENCOUNTER — Other Ambulatory Visit: Payer: Self-pay | Admitting: *Deleted

## 2021-01-10 DIAGNOSIS — F17210 Nicotine dependence, cigarettes, uncomplicated: Secondary | ICD-10-CM

## 2021-01-10 DIAGNOSIS — Z87891 Personal history of nicotine dependence: Secondary | ICD-10-CM

## 2021-01-11 ENCOUNTER — Other Ambulatory Visit: Payer: Self-pay | Admitting: Cardiology

## 2021-01-26 ENCOUNTER — Other Ambulatory Visit: Payer: Self-pay | Admitting: Registered Nurse

## 2021-01-26 DIAGNOSIS — F411 Generalized anxiety disorder: Secondary | ICD-10-CM

## 2021-02-03 ENCOUNTER — Other Ambulatory Visit (HOSPITAL_COMMUNITY): Payer: Self-pay | Admitting: Hematology

## 2021-02-03 ENCOUNTER — Encounter (HOSPITAL_COMMUNITY): Payer: Self-pay

## 2021-02-03 MED ORDER — "ULTICARE TUBERCULIN SAFETY SYR 25G X 1"" 1 ML MISC": 0 refills | Status: DC

## 2021-02-07 ENCOUNTER — Other Ambulatory Visit (HOSPITAL_COMMUNITY): Payer: Self-pay | Admitting: Surgery

## 2021-02-07 DIAGNOSIS — E538 Deficiency of other specified B group vitamins: Secondary | ICD-10-CM

## 2021-02-07 DIAGNOSIS — D500 Iron deficiency anemia secondary to blood loss (chronic): Secondary | ICD-10-CM

## 2021-02-07 DIAGNOSIS — D75839 Thrombocytosis, unspecified: Secondary | ICD-10-CM

## 2021-02-08 ENCOUNTER — Encounter: Payer: Self-pay | Admitting: Registered Nurse

## 2021-02-08 ENCOUNTER — Other Ambulatory Visit: Payer: Self-pay | Admitting: Registered Nurse

## 2021-02-08 ENCOUNTER — Inpatient Hospital Stay (HOSPITAL_COMMUNITY): Payer: PPO | Attending: Hematology

## 2021-02-08 DIAGNOSIS — J439 Emphysema, unspecified: Secondary | ICD-10-CM | POA: Diagnosis not present

## 2021-02-08 DIAGNOSIS — D500 Iron deficiency anemia secondary to blood loss (chronic): Secondary | ICD-10-CM | POA: Insufficient documentation

## 2021-02-08 DIAGNOSIS — I700 Atherosclerosis of aorta: Secondary | ICD-10-CM | POA: Diagnosis not present

## 2021-02-08 DIAGNOSIS — F17210 Nicotine dependence, cigarettes, uncomplicated: Secondary | ICD-10-CM | POA: Diagnosis not present

## 2021-02-08 DIAGNOSIS — Z8249 Family history of ischemic heart disease and other diseases of the circulatory system: Secondary | ICD-10-CM | POA: Diagnosis not present

## 2021-02-08 DIAGNOSIS — D72829 Elevated white blood cell count, unspecified: Secondary | ICD-10-CM | POA: Diagnosis not present

## 2021-02-08 DIAGNOSIS — K922 Gastrointestinal hemorrhage, unspecified: Secondary | ICD-10-CM | POA: Diagnosis not present

## 2021-02-08 DIAGNOSIS — E538 Deficiency of other specified B group vitamins: Secondary | ICD-10-CM | POA: Diagnosis not present

## 2021-02-08 DIAGNOSIS — D75839 Thrombocytosis, unspecified: Secondary | ICD-10-CM | POA: Insufficient documentation

## 2021-02-08 DIAGNOSIS — F411 Generalized anxiety disorder: Secondary | ICD-10-CM

## 2021-02-08 DIAGNOSIS — Z79899 Other long term (current) drug therapy: Secondary | ICD-10-CM | POA: Insufficient documentation

## 2021-02-08 DIAGNOSIS — R5383 Other fatigue: Secondary | ICD-10-CM | POA: Diagnosis not present

## 2021-02-08 DIAGNOSIS — J449 Chronic obstructive pulmonary disease, unspecified: Secondary | ICD-10-CM | POA: Insufficient documentation

## 2021-02-08 LAB — CBC WITH DIFFERENTIAL/PLATELET
Abs Immature Granulocytes: 0.11 10*3/uL — ABNORMAL HIGH (ref 0.00–0.07)
Basophils Absolute: 0.1 10*3/uL (ref 0.0–0.1)
Basophils Relative: 1 %
Eosinophils Absolute: 0.2 10*3/uL (ref 0.0–0.5)
Eosinophils Relative: 1 %
HCT: 46.6 % — ABNORMAL HIGH (ref 36.0–46.0)
Hemoglobin: 15 g/dL (ref 12.0–15.0)
Immature Granulocytes: 1 %
Lymphocytes Relative: 15 %
Lymphs Abs: 2.3 10*3/uL (ref 0.7–4.0)
MCH: 31.9 pg (ref 26.0–34.0)
MCHC: 32.2 g/dL (ref 30.0–36.0)
MCV: 99.1 fL (ref 80.0–100.0)
Monocytes Absolute: 1.5 10*3/uL — ABNORMAL HIGH (ref 0.1–1.0)
Monocytes Relative: 10 %
Neutro Abs: 11.3 10*3/uL — ABNORMAL HIGH (ref 1.7–7.7)
Neutrophils Relative %: 72 %
Platelets: 427 10*3/uL — ABNORMAL HIGH (ref 150–400)
RBC: 4.7 MIL/uL (ref 3.87–5.11)
RDW: 13.2 % (ref 11.5–15.5)
WBC: 15.5 10*3/uL — ABNORMAL HIGH (ref 4.0–10.5)
nRBC: 0 % (ref 0.0–0.2)

## 2021-02-08 LAB — COMPREHENSIVE METABOLIC PANEL
ALT: 15 U/L (ref 0–44)
AST: 14 U/L — ABNORMAL LOW (ref 15–41)
Albumin: 3.7 g/dL (ref 3.5–5.0)
Alkaline Phosphatase: 86 U/L (ref 38–126)
Anion gap: 12 (ref 5–15)
BUN: 11 mg/dL (ref 8–23)
CO2: 22 mmol/L (ref 22–32)
Calcium: 9.3 mg/dL (ref 8.9–10.3)
Chloride: 102 mmol/L (ref 98–111)
Creatinine, Ser: 0.73 mg/dL (ref 0.44–1.00)
GFR, Estimated: >60 mL/min (ref >60)
Glucose, Bld: 183 mg/dL — ABNORMAL HIGH (ref 70–99)
Potassium: 4 mmol/L (ref 3.5–5.1)
Sodium: 136 mmol/L (ref 135–145)
Total Bilirubin: 0.4 mg/dL (ref 0.3–1.2)
Total Protein: 7 g/dL (ref 6.5–8.1)

## 2021-02-08 LAB — LACTATE DEHYDROGENASE: LDH: 105 U/L (ref 98–192)

## 2021-02-08 LAB — IRON AND TIBC
Iron: 66 ug/dL (ref 28–170)
Saturation Ratios: 18 % (ref 10.4–31.8)
TIBC: 364 ug/dL (ref 250–450)
UIBC: 298 ug/dL

## 2021-02-08 LAB — FERRITIN: Ferritin: 147 ng/mL (ref 11–307)

## 2021-02-08 LAB — VITAMIN D 25 HYDROXY (VIT D DEFICIENCY, FRACTURES): Vit D, 25-Hydroxy: 38.47 ng/mL (ref 30–100)

## 2021-02-09 ENCOUNTER — Ambulatory Visit (HOSPITAL_COMMUNITY)
Admission: RE | Admit: 2021-02-09 | Discharge: 2021-02-09 | Disposition: A | Discharge status: Home / Self Care | Payer: PPO | Source: Ambulatory Visit | Attending: Acute Care | Admitting: Acute Care

## 2021-02-09 ENCOUNTER — Other Ambulatory Visit: Payer: Self-pay

## 2021-02-09 DIAGNOSIS — Z87891 Personal history of nicotine dependence: Secondary | ICD-10-CM | POA: Insufficient documentation

## 2021-02-09 DIAGNOSIS — F17210 Nicotine dependence, cigarettes, uncomplicated: Secondary | ICD-10-CM | POA: Diagnosis not present

## 2021-02-10 ENCOUNTER — Telehealth: Payer: Self-pay

## 2021-02-10 NOTE — Telephone Encounter (Signed)
Patient medication was sent the Pharmacy on 02/08/2021

## 2021-02-10 NOTE — Telephone Encounter (Signed)
Patients medication was sent 

## 2021-02-14 NOTE — Progress Notes (Signed)
[name], [place] [address]   CLINIC:  Medical Oncology/Hematology  PCP:  Tammy Coss, NP [address] [place] [address]  [phone]  REASON FOR VISIT:  Follow-up for IDA  PRIOR THERAPY: none  CURRENT THERAPY: Intermittent Feraheme  INTERVAL HISTORY:  Tammy Mcpherson, a 65 y.o. adult, returns for routine follow-up for her IDA. Tammy Mcpherson was last seen on 10/05/2020.  Today she reports feeling fair. She reports decreased energy levels which she believes may be caused by seasonal changes. She denies blood in the stool. She denies any cravings for ice. She denies n/v/d/c. She has been taking 2000 units of vitamin D daily. Her has tolerated her recent iron infusion well.   REVIEW OF SYSTEMS:  Review of Systems  Constitutional: Positive for appetite change (50%) and fatigue (50%).  Gastrointestinal: Negative for blood in stool, constipation, diarrhea, nausea and vomiting.  All other systems reviewed and are negative.   PAST MEDICAL/SURGICAL HISTORY:  Past Medical History:  Diagnosis Date  . Anemia 04/30/2016  . Anxiety   . Blood transfusion without reported diagnosis   . Cardiomyopathy ([place])    a. EF 40-45% by echo in 04/2016 b. Improved to 60-65% by repeat imaging in 2018  . CHF (congestive heart failure) ([place])   . COPD (chronic obstructive pulmonary disease) ([place])   . Coronary artery calcification seen on CT scan   . Essential hypertension   . GERD (gastroesophageal reflux disease)   . History of bronchitis   . History of GI bleed   . Hyperlipidemia   . Iron deficiency anemia 05/12/2016   Bleeding ulcers  . Leukocytosis 03/23/2020  . Pollen allergy   . PSVT (paroxysmal supraventricular tachycardia) ([place])   . PVC's (premature ventricular contractions)   . Thrombocytosis 03/23/2020  . Vitamin B12 deficiency 03/23/2020   Past Surgical History:  Procedure Laterality Date  . ABDOMINAL HYSTERECTOMY     polyps  about  age 70  . Barbie Banner OSTEOTOMY Right 07/10/2013   Procedure: Barbie Banner OSTEOTOMY RIGHT FOOT;  Surgeon: Marcheta Grammes, DPM;  Location: [facility];  Service: Orthopedics;  Laterality: Right;  . AMPUTATION Left 05/09/2017   Procedure: AMPUTATION 5TH TOE LEFT FOOT;  Surgeon: [name], DPM;  Location: [facility];  Service: Podiatry;  Laterality: Left;  . AMPUTATION Left 06/13/2017   Procedure: AMPUTATION 2ND TOE LEFT FOOT;  Surgeon: [name], DPM;  Location: [facility];  Service: Podiatry;  Laterality: Left;  . BUNIONECTOMY Right 07/10/2013   Procedure: VOGLER BUNIONECTOMY RIGHT FOOT;  Surgeon: Marcheta Grammes, DPM;  Location: [facility];  Service: Orthopedics;  Laterality: Right;  . CHOLECYSTECTOMY N/A 08/22/2017   Procedure: LAPAROSCOPIC CHOLECYSTECTOMY;  Surgeon: Virl Cagey, MD;  Location: [facility];  Service: General;  Laterality: N/A;  . COLONOSCOPY N/A 07/07/2016   Dr. Oneida Alar; redundant left colon, diverticulosis at hepatic flexure, non-bleeding internal hemorrhoids  . ESOPHAGOGASTRODUODENOSCOPY N/A 07/07/2016   Dr. Oneida Alar: many non-bleeding cratered gastric ulcers without stigmata of bleeding in gastric antrum. four 2-3 mm angioectasias without bleeding in duodenal bulb and second portion of duodenum s/p APC. Chroni gastritis on path.   . ESOPHAGOGASTRODUODENOSCOPY N/A 08/03/2017   Dr. Oneida Alar: erosive gastritis, AVMs. Found a single non-bleeding angioectasia in stomach, s/p APC therapy. Four non-bleeding angioectasias in duodenum s/p APC. Non-bleeding erosive gastropathy  . IR ANGIOGRAM EXTREMITY LEFT  04/24/2017  . IR FEM POP ART ATHERECT INC PTA MOD SED  04/24/2017  . IR  INFUSION THROMBOL ARTERIAL INITIAL (MS)  04/24/2017  . IR RADIOLOGIST EVAL & MGMT  12/05/2016  . IR US GUIDE VASC ACCESS RIGHT  04/24/2017  . LIVER BIOPSY N/A 08/22/2017   Procedure: LIVER BIOPSY;  Surgeon: Virl Cagey, MD;  Location: [facility];  Service: General;  Laterality: N/A;  . METATARSAL HEAD EXCISION Right  07/10/2013   Procedure: METATARSAL HEAD RESECTION OF DIGITS 2 AND 3 RIGHT FOOT;  Surgeon: Marcheta Grammes, DPM;  Location: [facility];  Service: Orthopedics;  Laterality: Right;  . PROXIMAL INTERPHALANGEAL FUSION (PIP) Right 07/10/2013   Procedure: ARTHRODESIS PIPJ  2ND DIGIT RIGHT FOOT;  Surgeon: Marcheta Grammes, DPM;  Location: [facility];  Service: Orthopedics;  Laterality: Right;    SOCIAL HISTORY:  Social History   Socioeconomic History  . Marital status: Married    Spouse name: Tammy Mcpherson  . Number of children: 1  . Years of education: 48  . Highest education level: Not on file  Occupational History  . Occupation: disabled    Comment: heart  Tobacco Use  . Smoking status: Light Tobacco Smoker    Packs/day: 0.25    Years: 44.00    Pack years: 11.00    Types: Cigarettes    Start date: 05/10/1975  . Smokeless tobacco: Never Used  . Tobacco comment: 2 cigarettes per day 12/10/2019  Vaping Use  . Vaping Use: Former  Substance and Sexual Activity  . Alcohol use: Not Currently    Alcohol/week: 2.0 standard drinks    Types: 2 Glasses of wine per week  . Drug use: No  . Sexual activity: Yes    Birth control/protection: Surgical  Other Topics Concern  . Not on file  Social History Narrative   Disabled   Lives with husband Tammy Mcpherson   Two dogs   Social Determinants of Health   Financial Resource Strain: Not on file  Food Insecurity: Not on file  Transportation Needs: Not on file  Physical Activity: Not on file  Stress: Not on file  Social Connections: Not on file  Intimate Partner Violence: Not At Risk  . Fear of Current or Ex-Partner: No  . Emotionally Abused: No  . Physically Abused: No  . Sexually Abused: No    FAMILY HISTORY:  Family History  Adopted: Yes  Problem Relation Age of Onset  . Hypertension Father   . Coronary artery disease Sister   . Ulcers Maternal Grandmother   . Colon cancer Neg Hx        unknown, was adopted    CURRENT MEDICATIONS:  Current  Outpatient Medications  Medication Sig Dispense Refill  . acetaminophen (TYLENOL) 500 MG tablet Take 500 mg by mouth at bedtime as needed for moderate pain.     Marland Kitchen albuterol (PROVENTIL) (2.5 MG/3ML) 0.083% nebulizer solution Take 3 mLs (2.5 mg total) by nebulization every 4 (four) hours as needed for wheezing or shortness of breath. 75 mL 5  . albuterol (VENTOLIN HFA) 108 (90 Base) MCG/ACT inhaler INHALE TWO PUFFS INTO THE LUNGS EVERY SIX HOURS AS NEEDED FOR WHEEZING OR SHORTNESS OF BREATH 8.5 g 5  . ALPRAZolam (XANAX) 0.5 MG tablet TAKE 1 TABLET BY MOUTH AT BEDTIME AS NEEDED FOR ANXIETY 30 tablet 2  . aspirin 81 MG chewable tablet Chew 81 mg by mouth every morning.     Marland Kitchen atorvastatin (LIPITOR) 20 MG tablet TAKE ONE TABLET (20MG TOTAL) BY MOUTH DAILY 90 tablet 1  . bisoprolol (ZEBETA) 10 MG tablet TAKE ONE (1) TABLET BY  MOUTH EVERY DAY 90 tablet 3  . blood glucose meter kit and supplies Dispense based on patient and insurance preference. Use up to four times daily as directed. (FOR ICD-10 E10.9, E11.9). 1 each 0  . Budeson-Glycopyrrol-Formoterol (BREZTRI AEROSPHERE) 160-9-4.8 MCG/ACT AERO Inhale 2 puffs into the lungs 2 (two) times daily. 11.8 g 12  . Cholecalciferol (VITAMIN D) 50 MCG (2000 UT) CAPS Take by mouth daily.    . Continuous Blood Gluc Receiver (FREESTYLE LIBRE 14 DAY READER) DEVI See admin instructions. 4 each 0  . Continuous Blood Gluc Receiver (FREESTYLE LIBRE 2 READER) DEVI Check blood glucose up to 4 times daily 1 each 0  . Continuous Blood Gluc Sensor (FREESTYLE LIBRE 14 DAY SENSOR) MISC Inject 1 each into the skin See admin instructions. 4 each 0  . Continuous Blood Gluc Sensor (FREESTYLE LIBRE 2 SENSOR) MISC Apply one sensor to the upper arm avery 14 days. 6 each 1  . cyanocobalamin (,VITAMIN B-12,) 1000 MCG/ML injection INJECT 1 ML INTO THE MUSCLE EVERY 30 DAYS. 1 mL 0  . cyclobenzaprine (FLEXERIL) 5 MG tablet TAKE ONE TABLET BY MOUTH UP TO EVERY 8 HOURS AS NEEDED. START WITH ONE  TABLET AT BEDTIME AS NEEDED DUE TO SEDATION. 15 tablet 0  . dicyclomine (BENTYL) 10 MG capsule Take 1 capsule (10 mg total) by mouth as needed for spasms (30 mins before meals and at bedtime as needed for diarrhea and abdominal cramps.). Take no more than four times daily but take only as needed 120 capsule 1  . empagliflozin (JARDIANCE) 10 MG TABS tablet Take 10 mg by mouth daily before breakfast. 30 tablet 4  . FLUoxetine (PROZAC) 10 MG capsule TAKE TWO CASPULES (20MG TOTAL) BY MOUTH DAILY. 90 capsule 0  . gabapentin (NEURONTIN) 300 MG capsule TAKE ONE CAPSULE (300 MG TOTAL) BY MOUTHTWO TIMES DAILY. 180 capsule 1  . glucose blood (ONETOUCH ULTRA) test strip Use as instructed 100 each 12  . glucose monitoring kit (FREESTYLE) monitoring kit Use to check blood sugar BID as directed 1 each 0  . lactase (LACTAID) 3000 units tablet Take by mouth as needed.    . Lancets (ONETOUCH DELICA PLUS [id number]) MISC 1 each by Does not apply route 2 (two) times a day. 100 each 3  . losartan (COZAAR) 100 MG tablet TAKE ONE (1) TABLET BY MOUTH EVERY DAY 90 tablet 3  . metFORMIN (GLUCOPHAGE) 1000 MG tablet Take 0.5 tablets (500 mg total) by mouth 2 (two) times daily with a meal. Takes 500 mg twice daily.  Total of 1000 mg daily. 180 tablet 3  . Misc. Devices MISC Please provide supplies (needle, syringe, alcohol swabs) needed for patient to self-administer B-12 injections monthly. 1 each 12  . pantoprazole (PROTONIX) 40 MG tablet TAKE ONE (1) TABLET 30 MINS BEFORE YOUR FIRST MEAL. 90 tablet 3  . predniSONE (DELTASONE) 20 MG tablet Take 20 mg by mouth daily with breakfast.    . silver sulfADIAZINE (SILVADENE) 1 % cream Apply 1 application topically daily. 50 g 0  . spironolactone (ALDACTONE) 50 MG tablet Take 1 tablet (50 mg total) by mouth daily. 90 tablet 1  . Tuberculin-Allergy Syringes (ULTICARE TUBERCULIN SAFETY SYR) 25G X 1" 1 ML MISC USE TO ADMINISTER INJECTABLE VITAMIN B12. 1 each 0  . valACYclovir (VALTREX)  500 MG tablet Take 500 mg by mouth 2 (two) times daily.     No current facility-administered medications for this visit.    ALLERGIES:  Allergies  Allergen Reactions  .  Bee Venom Swelling  . Codeine Anaphylaxis    Tongue swelled  . Penicillins Swelling and Rash    Has patient had a PCN reaction causing immediate rash, facial/tongue/throat swelling, SOB or lightheadedness with hypotension: No, delayed Has patient had a PCN reaction causing severe rash involving mucus membranes or skin necrosis: No Has patient had a PCN reaction that required hospitalization No Has patient had a PCN reaction occurring within the last 10 years: No If all of the above answers are "NO", then may proceed with Cephalosporin use.   . Bupropion Other (See Comments)    "Made my skin crawl"  . Sudafed [Pseudoephedrine Hcl] Rash    PHYSICAL EXAM:  Performance status (ECOG): 1 - Symptomatic but completely ambulatory  There were no vitals filed for this visit. Wt Readings from Last 3 Encounters:  12/08/20 143 lb (64.9 kg)  12/07/20 143 lb 9.6 oz (65.1 kg)  11/15/20 143 lb 8 oz (65.1 kg)   Physical Exam Vitals reviewed.  Constitutional:      Appearance: Normal appearance.  Cardiovascular:     Rate and Rhythm: Normal rate and regular rhythm.     Pulses: Normal pulses.     Heart sounds: Normal heart sounds.  Pulmonary:     Effort: Pulmonary effort is normal.     Breath sounds: Normal breath sounds.  Musculoskeletal:     Right lower leg: No edema.     Left lower leg: No edema.  Neurological:     General: No focal deficit present.     Mental Status: She is alert and oriented to person, place, and time.  Psychiatric:        Mood and Affect: Mood normal.        Behavior: Behavior normal.     LABORATORY DATA:  I have reviewed the labs as listed.  CBC Latest Ref Rng & Units 02/08/2021 09/28/2020 09/01/2020  WBC 4.0 - 10.5 K/uL 15.5(H) 15.0(H) 12.3(H)  Hemoglobin 12.0 - 15.0 g/dL 15.0 14.7 15.5   Hematocrit 36.0 - 46.0 % 46.6(H) 45.3 45.1  Platelets 150 - 400 K/uL 427(H) 365 370   CMP Latest Ref Rng & Units 02/08/2021 09/28/2020 09/01/2020  Glucose 70 - 99 mg/dL 183(H) 223(H) 177(H)  BUN 8 - 23 mg/dL 11 11 7(L)  Creatinine 0.44 - 1.00 mg/dL 0.73 0.59 0.55(L)  Sodium 135 - 145 mmol/L 136 135 141  Potassium 3.5 - 5.1 mmol/L 4.0 3.7 4.4  Chloride 98 - 111 mmol/L 102 102 104  CO2 22 - 32 mmol/L _0 Calcium 8.9 - 10.3 mg/dL 9.3 9.3 10.0  Total Protein 6.5 - 8.1 g/dL 7.0 7.3 7.1  Total Bilirubin 0.3 - 1.2 mg/dL 0.4 0.6 0.3  Alkaline Phos 38 - 126 U/L 86 98 126(H)  AST 15 - 41 U/L 14(L) 15 12  ALT 0 - 44 U/L _1 Component Value Date/Time   RBC 4.70 02/08/2021 1336   MCV 99.1 02/08/2021 1336   MCV 94 09/01/2020 1510   MCH 31.9 02/08/2021 1336   MCHC 32.2 02/08/2021 1336   RDW 13.2 02/08/2021 1336   RDW 12.8 09/01/2020 1510   LYMPHSABS 2.3 02/08/2021 1336   LYMPHSABS 1.8 07/02/2019 1539   MONOABS 1.5 (H) 02/08/2021 1336   EOSABS 0.2 02/08/2021 1336   EOSABS 0.1 07/02/2019 1539   BASOSABS 0.1 02/08/2021 1336   BASOSABS 0.1 07/02/2019 1539    DIAGNOSTIC IMAGING:  I have independently reviewed the scans and discussed  with the patient. CT CHEST LUNG CA SCREEN LOW DOSE W/O CM  Result Date: 02/10/2021 CLINICAL DATA:  65 year old female with 37 pack-year history of smoking. Lung cancer screening. EXAM: CT CHEST WITHOUT CONTRAST LOW-DOSE FOR LUNG CANCER SCREENING TECHNIQUE: Multidetector CT imaging of the chest was performed following the standard protocol without IV contrast. COMPARISON:  12/17/2019 FINDINGS: Cardiovascular: The heart size is normal. No substantial pericardial effusion. Coronary artery calcification is evident. Atherosclerotic calcification is noted in the wall of the thoracic aorta. Mediastinum/Nodes: No mediastinal lymphadenopathy. No evidence for gross hilar lymphadenopathy although assessment is limited by the lack of intravenous contrast on  today's study. The esophagus has normal imaging features. There is no axillary lymphadenopathy. Lungs/Pleura: Centrilobular emphsyema noted. Tiny posterior right lower lobe pulmonary nodule identified previously is stable in the interval. No new suspicious pulmonary nodule or mass. No focal airspace consolidation. No pleural effusion. Upper Abdomen: Unremarkable. Musculoskeletal: No worrisome lytic or sclerotic osseous abnormality. IMPRESSION: 1. Lung-RADS 2, benign appearance or behavior. Continue annual screening with low-dose chest CT without contrast in 12 months. 2. Aortic Atherosclerosis (ICD10-I70.0) and Emphysema (ICD10-J43.9). Electronically Signed   By: Misty Stanley M.D.   On: 02/10/2021 15:43     ASSESSMENT:  1. Iron deficiency anemia: -Thought to be from chronic GI blood loss from gastric and small bowel AVMs. Patient on aspirin 81 mg. Plavix discontinued in October 2019. She had a history of multiple blood transfusions. -Last Feraheme infusion on 06/25/2020 on 07/07/2020.  2. Leukocytosis and thrombocytosis: -Petrich count on 12/09/2019 was 15.2. Differential shows 74% neutrophils, 14% lymphocytes and 9% monocytes and 2% eosinophils. -Prior work-up for JAK2 V617F testing and BCR/ABL by FISH was negative. -Platelets are normalized to 391. This is reactive from iron deficiency. -She is on steroid inhalers which could be contributing to leukocytosis.  3. B12 deficiency: -She is on monthly B12 injections. -Her B12 on 12/09/2019 was 455.   PLAN:  1. Iron deficiency anemia: -Last Feraheme was on 07/07/2020. - Reports energy levels are 50%.  Reviewed labs from 02/08/2021.  Hemoglobin is 15 and ferritin is 147, decreased from 266. - I have recommended repeat labs in 4 months and possible intravenous iron.  2. Leukocytosis and thrombocytosis: -She was tested for myeloproliferative disorders in the past which was negative. - Belvin count is remaining stable around 15 K with elevated  neutrophils and monocytes. - No further work-up needed.  3.  Vitamin D deficiency: -Continue vitamin D daily.  Levels are 38.4.  Orders placed this encounter:  No orders of the defined types were placed in this encounter.    Derek Jack, MD [place] [phone]   I, Thana Ates, am acting as a scribe for Dr. Derek Jack.  I, Derek Jack MD, have reviewed the above documentation for accuracy and completeness, and I agree with the above.

## 2021-02-15 ENCOUNTER — Inpatient Hospital Stay (HOSPITAL_COMMUNITY): Payer: PPO | Admitting: Hematology

## 2021-02-15 ENCOUNTER — Other Ambulatory Visit: Payer: Self-pay

## 2021-02-15 VITALS — BP 138/50 | HR 84 | Temp 96.8°F | Resp 20 | Wt 140.1 lb

## 2021-02-15 DIAGNOSIS — E538 Deficiency of other specified B group vitamins: Secondary | ICD-10-CM | POA: Diagnosis not present

## 2021-02-15 DIAGNOSIS — D75839 Thrombocytosis, unspecified: Secondary | ICD-10-CM

## 2021-02-15 DIAGNOSIS — D500 Iron deficiency anemia secondary to blood loss (chronic): Secondary | ICD-10-CM

## 2021-02-15 NOTE — Patient Instructions (Signed)
[facility] °Discharge Instructions ° °You were seen today by Dr. Katragadda. He went over your recent results. Dr. Katragadda will see you back in 4 months for labs and follow up. ° ° °Thank you for choosing [facility] at [facility] to provide your oncology and hematology care.  To afford each patient quality time with our provider, please arrive at least 15 minutes before your scheduled appointment time.  ° °If you have a lab appointment with the Cancer Center please come in thru the Main Entrance and check in at the main information desk ° °You need to re-schedule your appointment should you arrive 10 or more minutes late.  We strive to give you quality time with our providers, and arriving late affects you and other patients whose appointments are after yours.  Also, if you no show three or more times for appointments you may be dismissed from the clinic at the providers discretion.     °Again, thank you for choosing [facility].  Our hope is that these requests will decrease the amount of time that you wait before being seen by our physicians.       °_____________________________________________________________ ° °Should you have questions after your visit to  Cancer Center, please contact our office at [phone] between the hours of 8:00 a.m. and 4:30 p.m.  Voicemails left after 4:00 p.m. will not be returned until the following business day.  For prescription refill requests, have your pharmacy contact our office and allow 72 hours.   ° °Cancer Center Support Programs:  ° °> Cancer Support Group  °2nd Tuesday of the month 1pm-2pm, Journey Room  ° ° °

## 2021-02-16 ENCOUNTER — Other Ambulatory Visit: Payer: Self-pay | Admitting: Registered Nurse

## 2021-02-17 ENCOUNTER — Encounter: Payer: Self-pay | Admitting: *Deleted

## 2021-02-17 DIAGNOSIS — F17210 Nicotine dependence, cigarettes, uncomplicated: Secondary | ICD-10-CM

## 2021-02-17 DIAGNOSIS — Z87891 Personal history of nicotine dependence: Secondary | ICD-10-CM

## 2021-02-17 NOTE — Progress Notes (Signed)
Please call patient and let them  know their  low dose Ct was read as a Lung RADS 2: nodules that are benign in appearance and behavior with a very low likelihood of becoming a clinically active cancer due to size or lack of growth. Recommendation per radiology is for a repeat LDCT in 12 months. .Please let them  know we will order and schedule their  annual screening scan for 01/2022. Please let them  know there was notation of CAD on their  scan.  Please remind the patient  that this is a non-gated exam therefore degree or severity of disease  cannot be determined. Please have them  follow up with their PCP regarding potential risk factor modification, dietary therapy or pharmacologic therapy if clinically indicated. Pt.  is  currently on statin therapy. Please place order for annual  screening scan for  01/2022 and fax results to PCP. Thanks so much. 

## 2021-02-21 ENCOUNTER — Encounter: Payer: Self-pay | Admitting: Registered Nurse

## 2021-02-21 DIAGNOSIS — J439 Emphysema, unspecified: Secondary | ICD-10-CM | POA: Insufficient documentation

## 2021-03-01 ENCOUNTER — Other Ambulatory Visit: Payer: Self-pay

## 2021-03-01 ENCOUNTER — Encounter: Payer: Self-pay | Admitting: Registered Nurse

## 2021-03-01 DIAGNOSIS — E1165 Type 2 diabetes mellitus with hyperglycemia: Secondary | ICD-10-CM

## 2021-03-01 MED ORDER — FREESTYLE LIBRE 14 DAY SENSOR MISC: 1.0000 | 0 refills | Status: DC

## 2021-03-09 ENCOUNTER — Encounter (HOSPITAL_COMMUNITY): Payer: Self-pay

## 2021-03-09 ENCOUNTER — Other Ambulatory Visit (HOSPITAL_COMMUNITY): Payer: Self-pay | Admitting: Hematology

## 2021-03-09 ENCOUNTER — Encounter: Payer: Self-pay | Admitting: Gastroenterology

## 2021-03-09 ENCOUNTER — Other Ambulatory Visit: Payer: Self-pay

## 2021-03-09 ENCOUNTER — Ambulatory Visit: Payer: PPO | Admitting: Gastroenterology

## 2021-03-09 VITALS — BP 140/76 | HR 80 | Temp 97.6°F | Ht 65.0 in | Wt 140.2 lb

## 2021-03-09 DIAGNOSIS — R197 Diarrhea, unspecified: Secondary | ICD-10-CM

## 2021-03-09 MED ORDER — RIFAXIMIN 550 MG PO TABS: 550.0000 mg | ORAL_TABLET | Freq: Three times a day (TID) | ORAL | 0 refills | Status: AC

## 2021-03-09 NOTE — Progress Notes (Signed)
Referring Provider: [name], NP Primary Care Physician:  [name], NP Primary GI: Dr. Abbey Chatters  Chief Complaint  Patient presents with  . Diarrhea    30 min after eating a meal. Take bentyl when she is going to go out to eat but does not seem to help like before    HPI:   Tammy Mcpherson is a 65 y.o. adult presenting today with a history of chronic diarrhea felt to be multifactorial in setting of IBS-D,bile salt induced diarrhea, and lactose intolerance, PUD in 2017 and gastric/duodenal AVMs s/p ablation 2018, IDA followed by Hematology, last colonoscopy in 2017 with diverticulosis. Negative celiac serologies.   Currently maintained on dicyclomine. Mainly taking only when she goes out to eat. At home, will have a looser BM but usually once. Will have urgent stool when going out to eat with postprandial diarrhea. Dicyclomine used to help with this but now doesn't feel it is working as well. Will take one before eating. Has intermittent cramping at home as well. Has been on metformin many years. The times she was off metformin, she felt good with better consistency of BMs.   Past Medical History:  Diagnosis Date  . Anemia 04/30/2016  . Anxiety   . Blood transfusion without reported diagnosis   . Cardiomyopathy ([place])    a. EF 40-45% by echo in 04/2016 b. Improved to 60-65% by repeat imaging in 2018  . CHF (congestive heart failure) ([place])   . COPD (chronic obstructive pulmonary disease) ([place])   . Coronary artery calcification seen on CT scan   . Essential hypertension   . GERD (gastroesophageal reflux disease)   . History of bronchitis   . History of GI bleed   . Hyperlipidemia   . Iron deficiency anemia 05/12/2016   Bleeding ulcers  . Leukocytosis 03/23/2020  . Pollen allergy   . PSVT (paroxysmal supraventricular tachycardia) ([place])   . PVC's (premature ventricular contractions)   . Thrombocytosis 03/23/2020  . Vitamin B12 deficiency 03/23/2020    Past Surgical  History:  Procedure Laterality Date  . ABDOMINAL HYSTERECTOMY     polyps  about age 48  . Barbie Banner OSTEOTOMY Right 07/10/2013   Procedure: Barbie Banner OSTEOTOMY RIGHT FOOT;  Surgeon: Marcheta Grammes, DPM;  Location: [facility];  Service: Orthopedics;  Laterality: Right;  . AMPUTATION Left 05/09/2017   Procedure: AMPUTATION 5TH TOE LEFT FOOT;  Surgeon: [name], DPM;  Location: [facility];  Service: Podiatry;  Laterality: Left;  . AMPUTATION Left 06/13/2017   Procedure: AMPUTATION 2ND TOE LEFT FOOT;  Surgeon: [name], DPM;  Location: [facility];  Service: Podiatry;  Laterality: Left;  . BUNIONECTOMY Right 07/10/2013   Procedure: VOGLER BUNIONECTOMY RIGHT FOOT;  Surgeon: Marcheta Grammes, DPM;  Location: [facility];  Service: Orthopedics;  Laterality: Right;  . CHOLECYSTECTOMY N/A 08/22/2017   Procedure: LAPAROSCOPIC CHOLECYSTECTOMY;  Surgeon: Virl Cagey, MD;  Location: [facility];  Service: General;  Laterality: N/A;  . COLONOSCOPY N/A 07/07/2016   Dr. Oneida Alar; redundant left colon, diverticulosis at hepatic flexure, non-bleeding internal hemorrhoids  . ESOPHAGOGASTRODUODENOSCOPY N/A 07/07/2016   Dr. Oneida Alar: many non-bleeding cratered gastric ulcers without stigmata of bleeding in gastric antrum. four 2-3 mm angioectasias without bleeding in duodenal bulb and second portion of duodenum s/p APC. Chroni gastritis on path.   . ESOPHAGOGASTRODUODENOSCOPY N/A 08/03/2017   Dr. Oneida Alar: erosive gastritis, AVMs. Found a single non-bleeding angioectasia in stomach, s/p APC therapy. Four non-bleeding angioectasias in duodenum s/p APC. Non-bleeding erosive  gastropathy  . IR ANGIOGRAM EXTREMITY LEFT  04/24/2017  . IR FEM POP ART ATHERECT INC PTA MOD SED  04/24/2017  . IR INFUSION THROMBOL ARTERIAL INITIAL (MS)  04/24/2017  . IR RADIOLOGIST EVAL & MGMT  12/05/2016  . IR US GUIDE VASC ACCESS RIGHT  04/24/2017  . LIVER BIOPSY N/A 08/22/2017   Procedure: LIVER BIOPSY;  Surgeon: Virl Cagey, MD;   Location: [facility];  Service: General;  Laterality: N/A;  . METATARSAL HEAD EXCISION Right 07/10/2013   Procedure: METATARSAL HEAD RESECTION OF DIGITS 2 AND 3 RIGHT FOOT;  Surgeon: Marcheta Grammes, DPM;  Location: [facility];  Service: Orthopedics;  Laterality: Right;  . PROXIMAL INTERPHALANGEAL FUSION (PIP) Right 07/10/2013   Procedure: ARTHRODESIS PIPJ  2ND DIGIT RIGHT FOOT;  Surgeon: Marcheta Grammes, DPM;  Location: [facility];  Service: Orthopedics;  Laterality: Right;    Current Outpatient Medications  Medication Sig Dispense Refill  . acetaminophen (TYLENOL) 500 MG tablet Take 500 mg by mouth at bedtime as needed for moderate pain.    Marland Kitchen albuterol (PROVENTIL) (2.5 MG/3ML) 0.083% nebulizer solution Take 3 mLs (2.5 mg total) by nebulization every 4 (four) hours as needed for wheezing or shortness of breath. 75 mL 5  . albuterol (VENTOLIN HFA) 108 (90 Base) MCG/ACT inhaler INHALE TWO PUFFS INTO THE LUNGS EVERY SIX HOURS AS NEEDED FOR WHEEZING OR SHORTNESS OF BREATH 8.5 g 5  . ALPRAZolam (XANAX) 0.5 MG tablet TAKE 1 TABLET BY MOUTH AT BEDTIME AS NEEDED FOR ANXIETY 30 tablet 2  . aspirin 81 MG chewable tablet Chew 81 mg by mouth every morning.     Marland Kitchen atorvastatin (LIPITOR) 20 MG tablet TAKE ONE TABLET (20MG TOTAL) BY MOUTH DAILY 90 tablet 1  . bisoprolol (ZEBETA) 10 MG tablet TAKE ONE (1) TABLET BY MOUTH EVERY DAY 90 tablet 3  . blood glucose meter kit and supplies Dispense based on patient and insurance preference. Use up to four times daily as directed. (FOR ICD-10 E10.9, E11.9). 1 each 0  . Budeson-Glycopyrrol-Formoterol (BREZTRI AEROSPHERE) 160-9-4.8 MCG/ACT AERO Inhale 2 puffs into the lungs 2 (two) times daily. 11.8 g 12  . Cholecalciferol (VITAMIN D) 50 MCG (2000 UT) CAPS Take by mouth daily.    . Continuous Blood Gluc Receiver (FREESTYLE LIBRE 14 DAY READER) DEVI See admin instructions. 4 each 0  . Continuous Blood Gluc Receiver (FREESTYLE LIBRE 2 READER) DEVI Check blood glucose up to 4  times daily 1 each 0  . cyanocobalamin (,VITAMIN B-12,) 1000 MCG/ML injection INJECT 1 ML INTO THE MUSCLE EVERY 30 DAYS. 1 mL 0  . cyclobenzaprine (FLEXERIL) 5 MG tablet TAKE ONE TABLET BY MOUTH UP TO EVERY 8 HOURS AS NEEDED. START WITH ONE TABLET AT BEDTIME AS NEEDED DUE TO SEDATION. 15 tablet 2  . dicyclomine (BENTYL) 10 MG capsule Take 10 mg by mouth as needed for spasms.    . empagliflozin (JARDIANCE) 10 MG TABS tablet Take 10 mg by mouth daily before breakfast. 30 tablet 4  . FLUoxetine (PROZAC) 10 MG capsule TAKE TWO CASPULES (20MG TOTAL) BY MOUTH DAILY. 90 capsule 0  . gabapentin (NEURONTIN) 300 MG capsule TAKE ONE CAPSULE (300 MG TOTAL) BY MOUTHTWO TIMES DAILY. 180 capsule 1  . lactase (LACTAID) 3000 units tablet Take by mouth as needed.    Marland Kitchen losartan (COZAAR) 100 MG tablet TAKE ONE (1) TABLET BY MOUTH EVERY DAY 90 tablet 3  . metFORMIN (GLUCOPHAGE) 1000 MG tablet Take 0.5 tablets (500 mg total) by mouth  2 (two) times daily with a meal. Takes 500 mg twice daily.  Total of 1000 mg daily. 180 tablet 3  . pantoprazole (PROTONIX) 40 MG tablet TAKE ONE (1) TABLET 30 MINS BEFORE YOUR FIRST MEAL. 90 tablet 3  . rifaximin (XIFAXAN) 550 MG TABS tablet Take 1 tablet (550 mg total) by mouth 3 (three) times daily for 14 days. 42 tablet 0  . spironolactone (ALDACTONE) 50 MG tablet Take 1 tablet (50 mg total) by mouth daily. 90 tablet 1  . glucose blood (ONETOUCH ULTRA) test strip Use as instructed 100 each 12  . glucose monitoring kit (FREESTYLE) monitoring kit Use to check blood sugar BID as directed 1 each 0  . Lancets (ONETOUCH DELICA PLUS [id number]) MISC 1 each by Does not apply route 2 (two) times a day. 100 each 3  . Misc. Devices MISC Please provide supplies (needle, syringe, alcohol swabs) needed for patient to self-administer B-12 injections monthly. 1 each 12  . Tuberculin-Allergy Syringes (ULTICARE TUBERCULIN SAFETY SYR) 25G X 1" 1 ML MISC USE TO ADMINISTER INJECTABLE VITAMIN B12. 1 each 0    No current facility-administered medications for this visit.    Allergies as of 03/09/2021 - Review Complete 03/09/2021  Allergen Reaction Noted  . Bee venom Swelling 05/19/2016  . Codeine Anaphylaxis 04/30/2016  . Penicillins Swelling and Rash 03/07/2012  . Bupropion Other (See Comments) 10/04/2017  . Sudafed [pseudoephedrine hcl] Rash 06/27/2013    Family History  Adopted: Yes  Problem Relation Age of Onset  . Hypertension Father   . Coronary artery disease Sister   . Ulcers Maternal Grandmother   . Colon cancer Neg Hx        unknown, was adopted    Social History   Socioeconomic History  . Marital status: Married    Spouse name: [name]  . Number of children: 1  . Years of education: 78  . Highest education level: Not on file  Occupational History  . Occupation: disabled    Comment: heart  Tobacco Use  . Smoking status: Light Tobacco Smoker    Packs/day: 0.25    Years: 44.00    Pack years: 11.00    Types: Cigarettes    Start date: 05/10/1975  . Smokeless tobacco: Never Used  . Tobacco comment: 2 cigarettes per day 12/10/2019  Vaping Use  . Vaping Use: Former  Substance and Sexual Activity  . Alcohol use: Not Currently    Alcohol/week: 2.0 standard drinks    Types: 2 Glasses of wine per week  . Drug use: No  . Sexual activity: Yes    Birth control/protection: Surgical  Other Topics Concern  . Not on file  Social History Narrative   Disabled   Lives with husband [name]   Two dogs   Social Determinants of Health   Financial Resource Strain: Not on file  Food Insecurity: Not on file  Transportation Mcpherson: Not on file  Physical Activity: Not on file  Stress: Not on file  Social Connections: Not on file    Review of Systems: Gen: Denies fever, chills, anorexia. Denies fatigue, weakness, weight loss.  CV: Denies chest pain, palpitations, syncope, peripheral edema, and claudication. Resp: Denies dyspnea at rest, cough, wheezing, coughing up blood, and  pleurisy. GI: see HPI Derm: Denies rash, itching, dry skin Psych: Denies depression, anxiety, memory loss, confusion. No homicidal or suicidal ideation.  Heme: Denies bruising, bleeding, and enlarged lymph nodes.  Physical Exam: BP 140/76   Pulse 80   Temp  97.6 F (36.4 C)   Ht 5' 5" (1.651 m)   Wt 140 lb 3.2 oz (63.6 kg)   BMI 23.33 kg/m  General:   Alert and oriented. No distress noted. Pleasant and cooperative.  Head:  Normocephalic and atraumatic. Eyes:  Conjuctiva clear without scleral icterus. Mouth:  Mask in place Abdomen:  +BS, soft, LLQ TTP and non-distended. No rebound or guarding. No HSM or masses noted. Msk:  Symmetrical without gross deformities. Normal posture. Extremities:  Without edema. Neurologic:  Alert and  oriented x4 Psych:  Alert and cooperative. Normal mood and affect.  ASSESSMENT/PLAN: BRITANI BEATTIE is a 65 y.o. adult presenting today with a history of chronic diarrhea felt to be multifactorial in setting of IBS-D,bile salt induced diarrhea, and lactose intolerance, PUD in 2017 and gastric/duodenal AVMs s/p ablation 2018, IDA followed by Hematology, last colonoscopy in 2017 with diverticulosis. Negative celiac serologies.   Diarrhea: interesting that she notes that when not taking metformin, she has improvement in symptoms. Although she has been on metformin many years, this could certainly be playing a role. I have sent a message to PCP to inquire about a drug holiday. I have also sent in Xifaxan as we may need to trial a 2 week course of this. She may increase dicyclomine to 20 mg when going out to eat if necessary.   IDA: felt due to gastric/duodenal AVMs. Colonoscopy 2017, last EGD in 2018. No prior capsule study. Last Ferritin 147, Hgb 15 in May 2022. Continue follow-up with Hematology.   Return in 6 months or sooner. We will be in contact regarding drug holiday for metformin once we receive word from PCP.   Tammy Needs, PhD, ANP-BC [facility]  Gastroenterology

## 2021-03-09 NOTE — Progress Notes (Signed)
CC'ED TO PCP 

## 2021-03-09 NOTE — Patient Instructions (Signed)
I have sent a message to your PCP to see about taking a break from metformin for now. I will message you on MyChart!  I have sent in Xifaxan to your pharmacy just to have on hand. We may need to try this.  You can still continue dicyclomine as needed and can take up to 2 capsules at one time if necessary but no more than 2 every 6 hours.  We will see you in 6 months!  I enjoyed seeing you again today! As you know, I value our relationship and want to provide genuine, compassionate, and quality care. I welcome your feedback. If you receive a survey regarding your visit,  I greatly appreciate you taking time to fill this out. See you next time!  Annitta Needs, PhD, ANP-BC [facility] Gastroenterology

## 2021-03-10 ENCOUNTER — Encounter (HOSPITAL_COMMUNITY): Payer: Self-pay | Admitting: Hematology

## 2021-03-10 ENCOUNTER — Other Ambulatory Visit (HOSPITAL_COMMUNITY): Payer: Self-pay | Admitting: *Deleted

## 2021-03-10 MED ORDER — VITAMIN D 50 MCG (2000 UT) PO CAPS: 1.0000 | ORAL_CAPSULE | Freq: Every day | ORAL | 4 refills | Status: DC

## 2021-03-22 ENCOUNTER — Other Ambulatory Visit: Payer: Self-pay

## 2021-03-22 ENCOUNTER — Other Ambulatory Visit: Payer: Self-pay | Admitting: Registered Nurse

## 2021-03-22 DIAGNOSIS — F411 Generalized anxiety disorder: Secondary | ICD-10-CM

## 2021-03-22 MED ORDER — FLUOXETINE HCL 10 MG PO CAPS: ORAL_CAPSULE | ORAL | 0 refills | Status: DC

## 2021-03-22 NOTE — Telephone Encounter (Signed)
Medication has been sent to pharmacy.  °

## 2021-04-25 ENCOUNTER — Other Ambulatory Visit: Payer: Self-pay | Admitting: Registered Nurse

## 2021-04-25 DIAGNOSIS — F411 Generalized anxiety disorder: Secondary | ICD-10-CM

## 2021-04-26 ENCOUNTER — Other Ambulatory Visit: Payer: Self-pay

## 2021-04-26 ENCOUNTER — Encounter: Payer: Self-pay | Admitting: Registered Nurse

## 2021-04-26 ENCOUNTER — Ambulatory Visit (INDEPENDENT_AMBULATORY_CARE_PROVIDER_SITE_OTHER): Payer: PPO | Admitting: Registered Nurse

## 2021-04-26 VITALS — BP 158/75 | HR 84 | Temp 98.3°F | Resp 18 | Ht 65.0 in | Wt 139.4 lb

## 2021-04-26 DIAGNOSIS — S9031XA Contusion of right foot, initial encounter: Secondary | ICD-10-CM

## 2021-04-26 DIAGNOSIS — M25476 Effusion, unspecified foot: Secondary | ICD-10-CM | POA: Diagnosis not present

## 2021-04-26 DIAGNOSIS — E1165 Type 2 diabetes mellitus with hyperglycemia: Secondary | ICD-10-CM | POA: Diagnosis not present

## 2021-04-26 DIAGNOSIS — B351 Tinea unguium: Secondary | ICD-10-CM

## 2021-04-26 DIAGNOSIS — F411 Generalized anxiety disorder: Secondary | ICD-10-CM

## 2021-04-26 LAB — POCT GLYCOSYLATED HEMOGLOBIN (HGB A1C): Hemoglobin A1C: 7.3 % — AB (ref 4.0–5.6)

## 2021-04-26 LAB — COMPREHENSIVE METABOLIC PANEL
ALT: 14 U/L (ref 0–35)
AST: 12 U/L (ref 0–37)
Albumin: 4 g/dL (ref 3.5–5.2)
Alkaline Phosphatase: 101 U/L (ref 39–117)
BUN: 9 mg/dL (ref 6–23)
CO2: 24 mEq/L (ref 19–32)
Calcium: 10 mg/dL (ref 8.4–10.5)
Chloride: 102 mEq/L (ref 96–112)
Creatinine, Ser: 0.63 mg/dL (ref 0.40–1.20)
GFR: 94.26 mL/min (ref >60.00)
Glucose, Bld: 145 mg/dL — ABNORMAL HIGH (ref 70–99)
Potassium: 4.1 mEq/L (ref 3.5–5.1)
Sodium: 138 mEq/L (ref 135–145)
Total Bilirubin: 0.4 mg/dL (ref 0.2–1.2)
Total Protein: 6.9 g/dL (ref 6.0–8.3)

## 2021-04-26 LAB — CBC WITH DIFFERENTIAL/PLATELET
Basophils Absolute: 0.1 10*3/uL (ref 0.0–0.1)
Basophils Relative: 0.9 % (ref 0.0–3.0)
Eosinophils Absolute: 0.2 10*3/uL (ref 0.0–0.7)
Eosinophils Relative: 1.2 % (ref 0.0–5.0)
HCT: 43.4 % (ref 36.0–46.0)
Hemoglobin: 14.4 g/dL (ref 12.0–15.0)
Lymphocytes Relative: 14.4 % (ref 12.0–46.0)
Lymphs Abs: 2.2 10*3/uL (ref 0.7–4.0)
MCHC: 33.2 g/dL (ref 30.0–36.0)
MCV: 95.5 fl (ref 78.0–100.0)
Monocytes Absolute: 1.6 10*3/uL — ABNORMAL HIGH (ref 0.1–1.0)
Monocytes Relative: 10.6 % (ref 3.0–12.0)
Neutro Abs: 10.9 10*3/uL — ABNORMAL HIGH (ref 1.4–7.7)
Neutrophils Relative %: 72.9 % (ref 43.0–77.0)
Platelets: 404 10*3/uL — ABNORMAL HIGH (ref 150.0–400.0)
RBC: 4.54 Mil/uL (ref 3.87–5.11)
RDW: 14.8 % (ref 11.5–15.5)
WBC: 14.9 10*3/uL — ABNORMAL HIGH (ref 4.0–10.5)

## 2021-04-26 LAB — TSH: TSH: 2.25 u[IU]/mL (ref 0.35–5.50)

## 2021-04-26 LAB — LIPID PANEL
Cholesterol: 199 mg/dL (ref 0–200)
HDL: 28.8 mg/dL — ABNORMAL LOW (ref >39.00)
NonHDL: 170.43
Total CHOL/HDL Ratio: 7
Triglycerides: 375 mg/dL — ABNORMAL HIGH (ref 0.0–149.0)
VLDL: 75 mg/dL — ABNORMAL HIGH (ref 0.0–40.0)

## 2021-04-26 LAB — URIC ACID: Uric Acid, Serum: 3.1 mg/dL (ref 2.4–7.0)

## 2021-04-26 LAB — LDL CHOLESTEROL, DIRECT: Direct LDL: 111 mg/dL

## 2021-04-26 MED ORDER — FLUOXETINE HCL 20 MG PO CAPS
20.0000 mg | ORAL_CAPSULE | Freq: Every day | ORAL | 3 refills | Status: DC
Start: 2021-04-26 — End: 2021-06-17

## 2021-04-26 MED ORDER — DICLOFENAC SODIUM 75 MG PO TBEC
75.0000 mg | DELAYED_RELEASE_TABLET | Freq: Two times a day (BID) | ORAL | 0 refills | Status: DC
Start: 2021-04-26 — End: 2021-05-10

## 2021-04-26 NOTE — Progress Notes (Signed)
Established Patient Office Visit  Subjective:  [name], adult    DOB: 11/01/1955  Age: 65 y.o. MRN: [id number]  CC:  Chief Complaint  Patient presents with   Foot Pain    Patient states she is here for right foot pain. Patient states she has been in pain for two weeks and thought her foot was getting better. She his her foot on the leg of the table and her toe nail look like its going to come off.    HPI Tammy Mcpherson presents for foot pain R side, had struck foot on leg of table around 2 weeks ago.  Pain has improved, but now appears as nail may detach No open sores    Also notes overdue for t2dm follow up  Last A1c: 8.6 on 09/01/20 Currently taking: metformin 545m PO bid ac, jardiance 168mPO qd No new complications Reports good compliance with medications Diet has been much improved from previosuly Exercise habits have been steady  Anxiety Ongoing Fluoxetine 204mO qd helpful Also using alprazolam prn No acute concerns No changes requested at this time  Past Medical History:  Diagnosis Date   Anemia 04/30/2016   Anxiety    Blood transfusion without reported diagnosis    Cardiomyopathy ([facility]  a. EF 40-45% by echo in 04/2016 b. Improved to 60-65% by repeat imaging in 2018   CHF (congestive heart failure) (HCC)    COPD (chronic obstructive pulmonary disease) (HCC)    Coronary artery calcification seen on CT scan    Essential hypertension    GERD (gastroesophageal reflux disease)    History of bronchitis    History of GI bleed    Hyperlipidemia    Iron deficiency anemia 05/12/2016   Bleeding ulcers   Leukocytosis 03/23/2020   Pollen allergy    PSVT (paroxysmal supraventricular tachycardia) (HCC)    PVC's (premature ventricular contractions)    Thrombocytosis 03/23/2020   Vitamin B12 deficiency 03/23/2020    Past Surgical History:  Procedure Laterality Date   ABDOMINAL HYSTERECTOMY     polyps  about age [age]AIKEN OSTEOTOMY Right  07/10/2013   Procedure: AIKBarbie BannerTEOTOMY RIGHT FOOT;  Surgeon: BenMarcheta GrammesPM;  Location: [facility];  Service: Orthopedics;  Laterality: Right;   AMPUTATION Left 05/09/2017   Procedure: AMPUTATION 5TH TOE LEFT FOOT;  Surgeon: McKCaprice BeaverPM;  Location: [facility];  Service: Podiatry;  Laterality: Left;   AMPUTATION Left 06/13/2017   Procedure: AMPUTATION 2ND TOE LEFT FOOT;  Surgeon: McKCaprice BeaverPM;  Location: [facility];  Service: Podiatry;  Laterality: Left;   BUNIONECTOMY Right 07/10/2013   Procedure: VOGLER BUNIONECTOMY RIGHT FOOT;  Surgeon: BenMarcheta GrammesPM;  Location: [facility];  Service: Orthopedics;  Laterality: Right;   CHOLECYSTECTOMY N/A 08/22/2017   Procedure: LAPAROSCOPIC CHOLECYSTECTOMY;  Surgeon: BriVirl CageyD;  Location: [facility];  Service: General;  Laterality: N/A;   COLONOSCOPY N/A 07/07/2016   Dr. FieOneida Alaredundant left colon, diverticulosis at hepatic flexure, non-bleeding internal hemorrhoids   ESOPHAGOGASTRODUODENOSCOPY N/A 07/07/2016   Dr. FieOneida Alarany non-bleeding cratered gastric ulcers without stigmata of bleeding in gastric antrum. four 2-3 mm angioectasias without bleeding in duodenal bulb and second portion of duodenum s/p APC. Chroni gastritis on path.    ESOPHAGOGASTRODUODENOSCOPY N/A 08/03/2017   Dr. FieOneida Alarrosive gastritis, AVMs. Found a single non-bleeding angioectasia in stomach, s/p APC therapy. Four non-bleeding angioectasias in duodenum s/p APC. Non-bleeding erosive gastropathy   IR ANGIOGRAM  EXTREMITY LEFT  04/24/2017   IR FEM POP ART ATHERECT INC PTA MOD SED  04/24/2017   IR INFUSION THROMBOL ARTERIAL INITIAL (MS)  04/24/2017   IR RADIOLOGIST EVAL & MGMT  12/05/2016   IR US GUIDE VASC ACCESS RIGHT  04/24/2017   LIVER BIOPSY N/A 08/22/2017   Procedure: LIVER BIOPSY;  Surgeon: Virl Cagey, MD;  Location: [facility];  Service: General;  Laterality: N/A;   METATARSAL HEAD EXCISION Right 07/10/2013   Procedure: METATARSAL HEAD  RESECTION OF DIGITS 2 AND 3 RIGHT FOOT;  Surgeon: Marcheta Grammes, DPM;  Location: [facility];  Service: Orthopedics;  Laterality: Right;   PROXIMAL INTERPHALANGEAL FUSION (PIP) Right 07/10/2013   Procedure: ARTHRODESIS PIPJ  2ND DIGIT RIGHT FOOT;  Surgeon: Marcheta Grammes, DPM;  Location: [facility];  Service: Orthopedics;  Laterality: Right;    Family History  Adopted: Yes  Problem Relation Age of Onset   Hypertension Father    Coronary artery disease Sister    Ulcers Maternal Grandmother    Colon cancer Neg Hx        unknown, was adopted    Social History   Socioeconomic History   Marital status: Married    Spouse name: [name]   Number of children: 1   Years of education: 12   Highest education level: Not on file  Occupational History   Occupation: disabled    Comment: heart  Tobacco Use   Smoking status: Light Smoker    Packs/day: 0.25    Years: 44.00    Pack years: 11.00    Types: Cigarettes    Start date: 05/10/1975   Smokeless tobacco: Never   Tobacco comments:    2 cigarettes per day 12/10/2019  Vaping Use   Vaping Use: Former  Substance and Sexual Activity   Alcohol use: Not Currently    Alcohol/week: 2.0 standard drinks    Types: 2 Glasses of wine per week   Drug use: No   Sexual activity: Yes    Birth control/protection: Surgical  Other Topics Concern   Not on file  Social History Narrative   Disabled   Lives with husband [name]   Two dogs   Social Determinants of Health   Financial Resource Strain: Not on file  Food Insecurity: Not on file  Transportation Needs: Not on file  Physical Activity: Not on file  Stress: Not on file  Social Connections: Not on file  Intimate Partner Violence: Not At Risk   Fear of Current or Ex-Partner: No   Emotionally Abused: No   Physically Abused: No   Sexually Abused: No    Outpatient Medications Prior to Visit  Medication Sig Dispense Refill   acetaminophen (TYLENOL) 500 MG tablet Take 500 mg by mouth at  bedtime as needed for moderate pain.     albuterol (PROVENTIL) (2.5 MG/3ML) 0.083% nebulizer solution Take 3 mLs (2.5 mg total) by nebulization every 4 (four) hours as needed for wheezing or shortness of breath. 75 mL 5   albuterol (VENTOLIN HFA) 108 (90 Base) MCG/ACT inhaler INHALE TWO PUFFS INTO THE LUNGS EVERY SIX HOURS AS NEEDED FOR WHEEZING OR SHORTNESS OF BREATH 8.5 g 5   ALPRAZolam (XANAX) 0.5 MG tablet TAKE 1 TABLET BY MOUTH AT BEDTIME AS NEEDED FOR ANXIETY 30 tablet 2   aspirin 81 MG chewable tablet Chew 81 mg by mouth every morning.      atorvastatin (LIPITOR) 20 MG tablet TAKE ONE TABLET (20MG TOTAL) BY MOUTH DAILY 90 tablet  1   bisoprolol (ZEBETA) 10 MG tablet TAKE ONE (1) TABLET BY MOUTH EVERY DAY 90 tablet 3   blood glucose meter kit and supplies Dispense based on patient and insurance preference. Use up to four times daily as directed. (FOR ICD-10 E10.9, E11.9). 1 each 0   Budeson-Glycopyrrol-Formoterol (BREZTRI AEROSPHERE) 160-9-4.8 MCG/ACT AERO Inhale 2 puffs into the lungs 2 (two) times daily. 11.8 g 12   Cholecalciferol (VITAMIN D) 50 MCG (2000 UT) CAPS Take 1 capsule (2,000 Units total) by mouth daily. 90 capsule 4   Continuous Blood Gluc Receiver (FREESTYLE LIBRE 14 DAY READER) DEVI See admin instructions. 4 each 0   Continuous Blood Gluc Receiver (FREESTYLE LIBRE 2 READER) DEVI Check blood glucose up to 4 times daily 1 each 0   cyanocobalamin (,VITAMIN B-12,) 1000 MCG/ML injection INJECT 1 ML INTO THE MUSCLE EVERY 30 DAYS. 1 mL 11   cyclobenzaprine (FLEXERIL) 5 MG tablet TAKE ONE TABLET BY MOUTH UP TO EVERY 8 HOURS AS NEEDED. START WITH ONE TABLET AT BEDTIME AS NEEDED DUE TO SEDATION. 15 tablet 2   dicyclomine (BENTYL) 10 MG capsule Take 10 mg by mouth as needed for spasms.     empagliflozin (JARDIANCE) 10 MG TABS tablet Take 10 mg by mouth daily before breakfast. 30 tablet 4   FLUoxetine (PROZAC) 10 MG capsule TAKE TWO CASPULES (20MG TOTAL) BY MOUTH DAILY. 60 capsule 0    gabapentin (NEURONTIN) 300 MG capsule TAKE ONE CAPSULE (300 MG TOTAL) BY MOUTHTWO TIMES DAILY. 180 capsule 1   glucose blood (ONETOUCH ULTRA) test strip Use as instructed 100 each 12   glucose monitoring kit (FREESTYLE) monitoring kit Use to check blood sugar BID as directed 1 each 0   lactase (LACTAID) 3000 units tablet Take by mouth as needed.     Lancets (ONETOUCH DELICA PLUS [id number]) MISC 1 each by Does not apply route 2 (two) times a day. 100 each 3   losartan (COZAAR) 100 MG tablet TAKE ONE (1) TABLET BY MOUTH EVERY DAY 90 tablet 3   metFORMIN (GLUCOPHAGE) 1000 MG tablet Take 0.5 tablets (500 mg total) by mouth 2 (two) times daily with a meal. Takes 500 mg twice daily.  Total of 1000 mg daily. 180 tablet 3   Misc. Devices MISC Please provide supplies (needle, syringe, alcohol swabs) needed for patient to self-administer B-12 injections monthly. 1 each 12   pantoprazole (PROTONIX) 40 MG tablet TAKE ONE (1) TABLET 30 MINS BEFORE YOUR FIRST MEAL. 90 tablet 3   spironolactone (ALDACTONE) 50 MG tablet Take 1 tablet (50 mg total) by mouth daily. 90 tablet 1   Tuberculin-Allergy Syringes (ULTICARE TUBERCULIN SAFETY SYR) 25G X 1" 1 ML MISC USE TO ADMINISTER INJECTABLE VITAMIN B12. 1 each 0   No facility-administered medications prior to visit.    Allergies  Allergen Reactions   Bee Venom Swelling   Codeine Anaphylaxis    Tongue swelled   Penicillins Swelling and Rash    Has patient had a PCN reaction causing immediate rash, facial/tongue/throat swelling, SOB or lightheadedness with hypotension: No, delayed Has patient had a PCN reaction causing severe rash involving mucus membranes or skin necrosis: No Has patient had a PCN reaction that required hospitalization No Has patient had a PCN reaction occurring within the last 10 years: No If all of the above answers are "NO", then may proceed with Cephalosporin use.    Bupropion Other (See Comments)    "Made my skin crawl"   Sudafed  [Pseudoephedrine Hcl] Rash  ROS Review of Systems  Constitutional: Negative.   HENT: Negative.    Eyes: Negative.   Respiratory: Negative.    Cardiovascular: Negative.   Gastrointestinal: Negative.   Genitourinary: Negative.   Musculoskeletal: Negative.   Skin: Negative.   Neurological: Negative.   Psychiatric/Behavioral: Negative.    All other systems reviewed and are negative.    Objective:    Physical Exam Constitutional:      General: She is not in acute distress.    Appearance: Normal appearance. She is normal weight. She is not ill-appearing, toxic-appearing or diaphoretic.  Cardiovascular:     Rate and Rhythm: Normal rate and regular rhythm.     Heart sounds: Normal heart sounds. No murmur heard.   No friction rub. No gallop.  Pulmonary:     Effort: Pulmonary effort is normal. No respiratory distress.     Breath sounds: Normal breath sounds. No stridor. No wheezing, rhonchi or rales.  Chest:     Chest wall: No tenderness.  Skin:    General: Skin is warm and dry.     Capillary Refill: Capillary refill takes less than 2 seconds.     Findings: Bruising (R great toe, first mtp, bottom lateral of foot) present.     Comments: Multiple nails on both feet yellowed and thickened with onychomycosis  Neurological:     General: No focal deficit present.     Mental Status: She is alert and oriented to person, place, and time. Mental status is at baseline.  Psychiatric:        Mood and Affect: Mood normal.        Behavior: Behavior normal.        Thought Content: Thought content normal.        Judgment: Judgment normal.    BP (!) 158/75   Pulse 84   Temp 98.3 F (36.8 C) (Temporal)   Resp 18   Ht '5\' 5"'  (1.651 m)   Wt 139 lb 6.4 oz (63.2 kg)   SpO2 96%   BMI 23.20 kg/m  Wt Readings from Last 3 Encounters:  04/26/21 139 lb 6.4 oz (63.2 kg)  03/09/21 140 lb 3.2 oz (63.6 kg)  02/15/21 140 lb 1.6 oz (63.5 kg)     Health Maintenance Due  Topic Date Due    HEMOGLOBIN A1C  03/02/2021    There are no preventive care reminders to display for this patient.  Lab Results  Component Value Date   TSH 1.727 04/30/2016   Lab Results  Component Value Date   WBC 15.5 (H) 02/08/2021   HGB 15.0 02/08/2021   HCT 46.6 (H) 02/08/2021   MCV 99.1 02/08/2021   PLT 427 (H) 02/08/2021   Lab Results  Component Value Date   NA 136 02/08/2021   K 4.0 02/08/2021   CO2 22 02/08/2021   GLUCOSE 183 (H) 02/08/2021   BUN 11 02/08/2021   CREATININE 0.73 02/08/2021   BILITOT 0.4 02/08/2021   ALKPHOS 86 02/08/2021   AST 14 (L) 02/08/2021   ALT 15 02/08/2021   PROT 7.0 02/08/2021   ALBUMIN 3.7 02/08/2021   CALCIUM 9.3 02/08/2021   ANIONGAP 12 02/08/2021   Lab Results  Component Value Date   CHOL 202 (H) 09/01/2020   Lab Results  Component Value Date   HDL 31 (L) 09/01/2020   Lab Results  Component Value Date   LDLCALC 113 (H) 09/01/2020   Lab Results  Component Value Date   TRIG 335 (H) 09/01/2020  Lab Results  Component Value Date   CHOLHDL 6.5 (H) 09/01/2020   Lab Results  Component Value Date   HGBA1C 8.6 (A) 09/01/2020      Assessment & Plan:   Problem List Items Addressed This Visit   None Visit Diagnoses     Uncontrolled type 2 diabetes mellitus with hyperglycemia ([place])    -  Primary   Relevant Orders   POCT glycosylated hemoglobin (Hb A1C)   GAD (generalized anxiety disorder)           No orders of the defined types were placed in this encounter.   Follow-up: No follow-ups on file.   PLAN Suspect gout vs. Injury vs. Neuropathy. Will check labs for uric acid, xray for injury. Will refer to podiatry for t2dm foot exam Will also refer to podiatry for onychomycosis - nail appears as it is starting to detach from great toe of R foot. Multiple toes affected by onychomycosis.  A1c greatly improved to 7.3 today. Continue jardiance. She is no longer taking metformin and has not been for some time. Diclofenac for foot pain  relief. Use PRN. Discussed risks, benefits, side effects. Return in 3 mo for t2dm Labs collected. Will follow up with the patient as warranted. Refill fluoxetine and jardiance Patient encouraged to call clinic with any questions, comments, or concerns.  I spent 46 minutes with this patient managing multiple chronic conditions and multiple acute conditions, monitoring poct and send out labs, and coordinating follow up care with myself and with a specialist.  [name], NP

## 2021-04-26 NOTE — Patient Instructions (Addendum)
Tammy Mcpherson -   Always a pleasure to see you  Sorry that your foot isn't doing well at the moment Xray will take place at [place] at [facility]. They can often accommodate walk ins for xrays, but feel free to give them a call to check: [phone]  I will order labs to check on gout. I will let you know how they look soon  I have sent a message to Dr. Arvil Persons office asking them to contact you to get an appt scheduled. They should call sooner rather than later.   Great work with the A1c. Just need to bring it down a bit more to have adequate control of your diabetes  Refills on fluoxetine sent  Thank you  [name]     If you have lab work done today you will be contacted with your lab results within the next 2 weeks.  If you have not heard from [place] then please contact us. The fastest way to get your results is to register for My Chart.   IF you received an x-ray today, you will receive an invoice from [facility]. Please contact [facility] at [phone] with questions or concerns regarding your invoice.   IF you received labwork today, you will receive an invoice from [facility]. Please contact [facility] at [phone] with questions or concerns regarding your invoice.   Our billing staff will not be able to assist you with questions regarding bills from these companies.  You will be contacted with the lab results as soon as they are available. The fastest way to get your results is to activate your My Chart account. Instructions are located on the last page of this paperwork. If you have not heard from [place] regarding the results in 2 weeks, please contact this office.

## 2021-05-01 ENCOUNTER — Encounter: Payer: Self-pay | Admitting: Registered Nurse

## 2021-05-03 ENCOUNTER — Encounter: Payer: Self-pay | Admitting: Registered Nurse

## 2021-05-03 ENCOUNTER — Ambulatory Visit (HOSPITAL_COMMUNITY)
Admission: RE | Admit: 2021-05-03 | Discharge: 2021-05-03 | Disposition: A | Discharge status: Home / Self Care | Payer: PPO | Source: Ambulatory Visit | Attending: Registered Nurse | Admitting: Registered Nurse

## 2021-05-03 ENCOUNTER — Other Ambulatory Visit: Payer: Self-pay

## 2021-05-03 DIAGNOSIS — F411 Generalized anxiety disorder: Secondary | ICD-10-CM | POA: Insufficient documentation

## 2021-05-03 DIAGNOSIS — Z9889 Other specified postprocedural states: Secondary | ICD-10-CM | POA: Diagnosis not present

## 2021-05-03 DIAGNOSIS — Z981 Arthrodesis status: Secondary | ICD-10-CM | POA: Diagnosis not present

## 2021-05-03 DIAGNOSIS — E1140 Type 2 diabetes mellitus with diabetic neuropathy, unspecified: Secondary | ICD-10-CM

## 2021-05-03 DIAGNOSIS — M7731 Calcaneal spur, right foot: Secondary | ICD-10-CM | POA: Diagnosis not present

## 2021-05-03 DIAGNOSIS — S99921A Unspecified injury of right foot, initial encounter: Secondary | ICD-10-CM | POA: Diagnosis not present

## 2021-05-03 MED ORDER — GABAPENTIN 300 MG PO CAPS: ORAL_CAPSULE | ORAL | 1 refills | Status: DC

## 2021-05-06 ENCOUNTER — Other Ambulatory Visit (HOSPITAL_COMMUNITY): Payer: Self-pay | Admitting: Hematology

## 2021-05-08 ENCOUNTER — Encounter: Payer: Self-pay | Admitting: Registered Nurse

## 2021-05-09 ENCOUNTER — Encounter: Payer: Self-pay | Admitting: Registered Nurse

## 2021-05-10 ENCOUNTER — Other Ambulatory Visit: Payer: Self-pay

## 2021-05-10 ENCOUNTER — Encounter: Payer: Self-pay | Admitting: Registered Nurse

## 2021-05-10 DIAGNOSIS — S9031XA Contusion of right foot, initial encounter: Secondary | ICD-10-CM

## 2021-05-10 DIAGNOSIS — M25476 Effusion, unspecified foot: Secondary | ICD-10-CM

## 2021-05-10 MED ORDER — DICLOFENAC SODIUM 75 MG PO TBEC: 75.0000 mg | DELAYED_RELEASE_TABLET | Freq: Two times a day (BID) | ORAL | 0 refills | Status: DC

## 2021-05-10 NOTE — Telephone Encounter (Signed)
Can you call in a refill for my diclofenac sodium  LFD 04/26/21 #30 with no refills LOV 04/26/21 NOV none

## 2021-05-10 NOTE — Telephone Encounter (Signed)
Patient would also like something stronger for pain. Has sent messages through patient advice with pictures attached. See additional messages for reference.

## 2021-05-17 ENCOUNTER — Other Ambulatory Visit: Payer: Self-pay

## 2021-05-17 ENCOUNTER — Encounter: Payer: Self-pay | Admitting: Emergency Medicine

## 2021-05-17 ENCOUNTER — Ambulatory Visit
Admission: EM | Admit: 2021-05-17 | Discharge: 2021-05-17 | Disposition: A | Discharge status: Home / Self Care | Payer: PPO | Attending: Family Medicine | Admitting: Family Medicine

## 2021-05-17 ENCOUNTER — Emergency Department (HOSPITAL_COMMUNITY): Payer: PPO

## 2021-05-17 ENCOUNTER — Encounter (HOSPITAL_COMMUNITY): Payer: Self-pay | Admitting: Emergency Medicine

## 2021-05-17 ENCOUNTER — Inpatient Hospital Stay (HOSPITAL_COMMUNITY)
Admission: EM | Admit: 2021-05-17 | Discharge: 2021-05-20 | DRG: 252 | Disposition: A | Discharge status: Home / Self Care | Payer: PPO | Attending: Family Medicine | Admitting: Family Medicine

## 2021-05-17 DIAGNOSIS — F32A Depression, unspecified: Secondary | ICD-10-CM | POA: Diagnosis present

## 2021-05-17 DIAGNOSIS — I10 Essential (primary) hypertension: Secondary | ICD-10-CM | POA: Diagnosis present

## 2021-05-17 DIAGNOSIS — I110 Hypertensive heart disease with heart failure: Secondary | ICD-10-CM | POA: Diagnosis not present

## 2021-05-17 DIAGNOSIS — I429 Cardiomyopathy, unspecified: Secondary | ICD-10-CM | POA: Diagnosis present

## 2021-05-17 DIAGNOSIS — Z885 Allergy status to narcotic agent status: Secondary | ICD-10-CM

## 2021-05-17 DIAGNOSIS — Z8249 Family history of ischemic heart disease and other diseases of the circulatory system: Secondary | ICD-10-CM

## 2021-05-17 DIAGNOSIS — J439 Emphysema, unspecified: Secondary | ICD-10-CM | POA: Diagnosis present

## 2021-05-17 DIAGNOSIS — Z720 Tobacco use: Secondary | ICD-10-CM | POA: Diagnosis present

## 2021-05-17 DIAGNOSIS — Z79899 Other long term (current) drug therapy: Secondary | ICD-10-CM

## 2021-05-17 DIAGNOSIS — I70221 Atherosclerosis of native arteries of extremities with rest pain, right leg: Secondary | ICD-10-CM | POA: Diagnosis not present

## 2021-05-17 DIAGNOSIS — E119 Type 2 diabetes mellitus without complications: Secondary | ICD-10-CM

## 2021-05-17 DIAGNOSIS — I998 Other disorder of circulatory system: Principal | ICD-10-CM

## 2021-05-17 DIAGNOSIS — Z20822 Contact with and (suspected) exposure to covid-19: Secondary | ICD-10-CM | POA: Diagnosis not present

## 2021-05-17 DIAGNOSIS — K219 Gastro-esophageal reflux disease without esophagitis: Secondary | ICD-10-CM | POA: Diagnosis not present

## 2021-05-17 DIAGNOSIS — I7092 Chronic total occlusion of artery of the extremities: Secondary | ICD-10-CM | POA: Diagnosis not present

## 2021-05-17 DIAGNOSIS — F17210 Nicotine dependence, cigarettes, uncomplicated: Secondary | ICD-10-CM | POA: Diagnosis present

## 2021-05-17 DIAGNOSIS — I739 Peripheral vascular disease, unspecified: Secondary | ICD-10-CM | POA: Diagnosis present

## 2021-05-17 DIAGNOSIS — E118 Type 2 diabetes mellitus with unspecified complications: Secondary | ICD-10-CM

## 2021-05-17 DIAGNOSIS — I2510 Atherosclerotic heart disease of native coronary artery without angina pectoris: Secondary | ICD-10-CM | POA: Diagnosis present

## 2021-05-17 DIAGNOSIS — E1152 Type 2 diabetes mellitus with diabetic peripheral angiopathy with gangrene: Secondary | ICD-10-CM | POA: Diagnosis not present

## 2021-05-17 DIAGNOSIS — Z7951 Long term (current) use of inhaled steroids: Secondary | ICD-10-CM

## 2021-05-17 DIAGNOSIS — M79671 Pain in right foot: Secondary | ICD-10-CM | POA: Diagnosis not present

## 2021-05-17 DIAGNOSIS — Z91030 Bee allergy status: Secondary | ICD-10-CM

## 2021-05-17 DIAGNOSIS — E1140 Type 2 diabetes mellitus with diabetic neuropathy, unspecified: Secondary | ICD-10-CM | POA: Diagnosis not present

## 2021-05-17 DIAGNOSIS — Z880 Allergy status to penicillin: Secondary | ICD-10-CM

## 2021-05-17 DIAGNOSIS — Z888 Allergy status to other drugs, medicaments and biological substances status: Secondary | ICD-10-CM

## 2021-05-17 DIAGNOSIS — K259 Gastric ulcer, unspecified as acute or chronic, without hemorrhage or perforation: Secondary | ICD-10-CM | POA: Diagnosis present

## 2021-05-17 DIAGNOSIS — J449 Chronic obstructive pulmonary disease, unspecified: Secondary | ICD-10-CM | POA: Diagnosis present

## 2021-05-17 DIAGNOSIS — E785 Hyperlipidemia, unspecified: Secondary | ICD-10-CM | POA: Diagnosis not present

## 2021-05-17 DIAGNOSIS — I5041 Acute combined systolic (congestive) and diastolic (congestive) heart failure: Secondary | ICD-10-CM | POA: Diagnosis present

## 2021-05-17 DIAGNOSIS — I999 Unspecified disorder of circulatory system: Secondary | ICD-10-CM

## 2021-05-17 DIAGNOSIS — Z90710 Acquired absence of both cervix and uterus: Secondary | ICD-10-CM | POA: Diagnosis not present

## 2021-05-17 DIAGNOSIS — Z7902 Long term (current) use of antithrombotics/antiplatelets: Secondary | ICD-10-CM

## 2021-05-17 DIAGNOSIS — Z7982 Long term (current) use of aspirin: Secondary | ICD-10-CM | POA: Diagnosis not present

## 2021-05-17 DIAGNOSIS — E7800 Pure hypercholesterolemia, unspecified: Secondary | ICD-10-CM | POA: Diagnosis not present

## 2021-05-17 DIAGNOSIS — F419 Anxiety disorder, unspecified: Secondary | ICD-10-CM | POA: Diagnosis present

## 2021-05-17 DIAGNOSIS — I743 Embolism and thrombosis of arteries of the lower extremities: Secondary | ICD-10-CM | POA: Diagnosis not present

## 2021-05-17 DIAGNOSIS — I70261 Atherosclerosis of native arteries of extremities with gangrene, right leg: Secondary | ICD-10-CM | POA: Diagnosis present

## 2021-05-17 DIAGNOSIS — E538 Deficiency of other specified B group vitamins: Secondary | ICD-10-CM | POA: Diagnosis not present

## 2021-05-17 DIAGNOSIS — Z7984 Long term (current) use of oral hypoglycemic drugs: Secondary | ICD-10-CM | POA: Diagnosis not present

## 2021-05-17 DIAGNOSIS — M62271 Nontraumatic ischemic infarction of muscle, right ankle and foot: Secondary | ICD-10-CM | POA: Diagnosis not present

## 2021-05-17 DIAGNOSIS — Z9114 Patient's other noncompliance with medication regimen: Secondary | ICD-10-CM

## 2021-05-17 LAB — BASIC METABOLIC PANEL
Anion gap: 8 (ref 5–15)
BUN: 9 mg/dL (ref 8–23)
CO2: 26 mmol/L (ref 22–32)
Calcium: 9.1 mg/dL (ref 8.9–10.3)
Chloride: 103 mmol/L (ref 98–111)
Creatinine, Ser: 0.56 mg/dL (ref 0.44–1.00)
GFR, Estimated: >60 mL/min (ref >60)
Glucose, Bld: 144 mg/dL — ABNORMAL HIGH (ref 70–99)
Potassium: 3.5 mmol/L (ref 3.5–5.1)
Sodium: 137 mmol/L (ref 135–145)

## 2021-05-17 LAB — CBC WITH DIFFERENTIAL/PLATELET
Abs Immature Granulocytes: 0.12 10*3/uL — ABNORMAL HIGH (ref 0.00–0.07)
Basophils Absolute: 0.1 10*3/uL (ref 0.0–0.1)
Basophils Relative: 0 %
Eosinophils Absolute: 0.1 10*3/uL (ref 0.0–0.5)
Eosinophils Relative: 0 %
HCT: 46.6 % — ABNORMAL HIGH (ref 36.0–46.0)
Hemoglobin: 15.3 g/dL — ABNORMAL HIGH (ref 12.0–15.0)
Immature Granulocytes: 1 %
Lymphocytes Relative: 12 %
Lymphs Abs: 1.9 10*3/uL (ref 0.7–4.0)
MCH: 31.9 pg (ref 26.0–34.0)
MCHC: 32.8 g/dL (ref 30.0–36.0)
MCV: 97.3 fL (ref 80.0–100.0)
Monocytes Absolute: 1.4 10*3/uL — ABNORMAL HIGH (ref 0.1–1.0)
Monocytes Relative: 9 %
Neutro Abs: 12.5 10*3/uL — ABNORMAL HIGH (ref 1.7–7.7)
Neutrophils Relative %: 78 %
Platelets: 381 10*3/uL (ref 150–400)
RBC: 4.79 MIL/uL (ref 3.87–5.11)
RDW: 13.7 % (ref 11.5–15.5)
WBC: 16.1 10*3/uL — ABNORMAL HIGH (ref 4.0–10.5)
nRBC: 0 % (ref 0.0–0.2)

## 2021-05-17 LAB — HEMOGLOBIN A1C
Hgb A1c MFr Bld: 7.2 % — ABNORMAL HIGH (ref 4.8–5.6)
Mean Plasma Glucose: 159.94 mg/dL

## 2021-05-17 LAB — RESP PANEL BY RT-PCR (FLU A&B, COVID) ARPGX2
Influenza A by PCR: NEGATIVE
Influenza B by PCR: NEGATIVE
SARS Coronavirus 2 by RT PCR: NEGATIVE

## 2021-05-17 LAB — HIV ANTIBODY (ROUTINE TESTING W REFLEX): HIV Screen 4th Generation wRfx: NONREACTIVE

## 2021-05-17 MED ORDER — ONDANSETRON HCL 4 MG PO TABS: 4.0000 mg | ORAL_TABLET | Freq: Four times a day (QID) | ORAL | Status: DC | PRN

## 2021-05-17 MED ORDER — INSULIN ASPART 100 UNIT/ML IJ SOLN: 0.0000 [IU] | Freq: Three times a day (TID) | INTRAMUSCULAR | Status: DC

## 2021-05-17 MED ORDER — EMPAGLIFLOZIN 10 MG PO TABS
10.0000 mg | ORAL_TABLET | Freq: Every day | ORAL | Status: DC
  Administered 2021-05-19: 10 mg via ORAL
  Filled 2021-05-17 (×4): qty 1

## 2021-05-17 MED ORDER — FENTANYL CITRATE PF 50 MCG/ML IJ SOSY
25.0000 ug | PREFILLED_SYRINGE | Freq: Once | INTRAMUSCULAR | Status: AC
  Administered 2021-05-17: 25 ug via INTRAVENOUS
  Filled 2021-05-17: qty 1

## 2021-05-17 MED ORDER — ONDANSETRON HCL 4 MG/2ML IJ SOLN
4.0000 mg | Freq: Four times a day (QID) | INTRAMUSCULAR | Status: DC | PRN
  Administered 2021-05-18: 4 mg via INTRAVENOUS
  Filled 2021-05-17: qty 2

## 2021-05-17 MED ORDER — ATORVASTATIN CALCIUM 10 MG PO TABS
20.0000 mg | ORAL_TABLET | Freq: Every day | ORAL | Status: DC
  Administered 2021-05-18 – 2021-05-20 (×3): 20 mg via ORAL
  Filled 2021-05-17 (×3): qty 2

## 2021-05-17 MED ORDER — MOMETASONE FURO-FORMOTEROL FUM 100-5 MCG/ACT IN AERO
2.0000 | INHALATION_SPRAY | Freq: Two times a day (BID) | RESPIRATORY_TRACT | Status: DC
  Administered 2021-05-18 – 2021-05-20 (×5): 2 via RESPIRATORY_TRACT
  Filled 2021-05-17: qty 8.8

## 2021-05-17 MED ORDER — METOPROLOL TARTRATE 5 MG/5ML IV SOLN
5.0000 mg | Freq: Four times a day (QID) | INTRAVENOUS | Status: DC | PRN
Start: 2021-05-17 — End: 2021-05-20
  Administered 2021-05-17 (×2): 5 mg via INTRAVENOUS
  Filled 2021-05-17 (×2): qty 5

## 2021-05-17 MED ORDER — GABAPENTIN 300 MG PO CAPS
300.0000 mg | ORAL_CAPSULE | Freq: Two times a day (BID) | ORAL | Status: DC
  Administered 2021-05-17 – 2021-05-20 (×6): 300 mg via ORAL
  Filled 2021-05-17 (×6): qty 1

## 2021-05-17 MED ORDER — FLUOXETINE HCL 20 MG PO CAPS
20.0000 mg | ORAL_CAPSULE | Freq: Every day | ORAL | Status: DC
  Administered 2021-05-18 – 2021-05-20 (×3): 20 mg via ORAL
  Filled 2021-05-17 (×3): qty 1

## 2021-05-17 MED ORDER — LOSARTAN POTASSIUM 50 MG PO TABS
100.0000 mg | ORAL_TABLET | Freq: Every day | ORAL | Status: DC
  Administered 2021-05-18 – 2021-05-20 (×3): 100 mg via ORAL
  Filled 2021-05-17 (×3): qty 2

## 2021-05-17 MED ORDER — CYCLOBENZAPRINE HCL 10 MG PO TABS
5.0000 mg | ORAL_TABLET | Freq: Three times a day (TID) | ORAL | Status: DC | PRN
  Administered 2021-05-18: 5 mg via ORAL
  Filled 2021-05-17 (×2): qty 1

## 2021-05-17 MED ORDER — BUDESON-GLYCOPYRROL-FORMOTEROL 160-9-4.8 MCG/ACT IN AERO: 2.0000 | INHALATION_SPRAY | Freq: Two times a day (BID) | RESPIRATORY_TRACT | Status: DC

## 2021-05-17 MED ORDER — ALBUTEROL SULFATE HFA 108 (90 BASE) MCG/ACT IN AERS
2.0000 | INHALATION_SPRAY | RESPIRATORY_TRACT | Status: DC | PRN
  Filled 2021-05-17: qty 6.7

## 2021-05-17 MED ORDER — OXYCODONE-ACETAMINOPHEN 5-325 MG PO TABS
1.0000 | ORAL_TABLET | Freq: Once | ORAL | Status: DC
Start: 2021-05-17 — End: 2021-05-17

## 2021-05-17 MED ORDER — ASPIRIN 81 MG PO CHEW
81.0000 mg | CHEWABLE_TABLET | Freq: Every morning | ORAL | Status: DC
  Administered 2021-05-18 – 2021-05-20 (×3): 81 mg via ORAL
  Filled 2021-05-17 (×2): qty 1

## 2021-05-17 MED ORDER — FENTANYL CITRATE PF 50 MCG/ML IJ SOSY
50.0000 ug | PREFILLED_SYRINGE | INTRAMUSCULAR | Status: DC | PRN
  Administered 2021-05-17: 50 ug via INTRAVENOUS
  Filled 2021-05-17: qty 1

## 2021-05-17 MED ORDER — UMECLIDINIUM BROMIDE 62.5 MCG/INH IN AEPB
1.0000 | INHALATION_SPRAY | Freq: Every day | RESPIRATORY_TRACT | Status: DC
  Administered 2021-05-18 – 2021-05-20 (×3): 1 via RESPIRATORY_TRACT
  Filled 2021-05-17: qty 7

## 2021-05-17 MED ORDER — SPIRONOLACTONE 25 MG PO TABS
50.0000 mg | ORAL_TABLET | Freq: Every day | ORAL | Status: DC
  Administered 2021-05-18 – 2021-05-20 (×3): 50 mg via ORAL
  Filled 2021-05-17 (×3): qty 2

## 2021-05-17 MED ORDER — DICLOFENAC SODIUM 75 MG PO TBEC
75.0000 mg | DELAYED_RELEASE_TABLET | Freq: Two times a day (BID) | ORAL | Status: DC
  Administered 2021-05-18 – 2021-05-20 (×4): 75 mg via ORAL
  Filled 2021-05-17 (×9): qty 1

## 2021-05-17 MED ORDER — PANTOPRAZOLE SODIUM 40 MG PO TBEC
40.0000 mg | DELAYED_RELEASE_TABLET | Freq: Every day | ORAL | Status: DC
  Administered 2021-05-18 – 2021-05-20 (×3): 40 mg via ORAL
  Filled 2021-05-17 (×3): qty 1

## 2021-05-17 MED ORDER — ALPRAZOLAM 0.5 MG PO TABS
0.5000 mg | ORAL_TABLET | Freq: Every evening | ORAL | Status: DC | PRN
  Administered 2021-05-18: 0.5 mg via ORAL
  Filled 2021-05-17: qty 1

## 2021-05-17 MED ORDER — POLYETHYLENE GLYCOL 3350 17 G PO PACK: 17.0000 g | PACK | Freq: Every day | ORAL | Status: DC | PRN

## 2021-05-17 MED ORDER — BISOPROLOL FUMARATE 5 MG PO TABS
10.0000 mg | ORAL_TABLET | Freq: Every day | ORAL | Status: DC
  Administered 2021-05-18 – 2021-05-20 (×3): 10 mg via ORAL
  Filled 2021-05-17: qty 2
  Filled 2021-05-17 (×2): qty 1

## 2021-05-17 MED ORDER — LACTATED RINGERS IV SOLN: INTRAVENOUS | Status: DC

## 2021-05-17 MED ORDER — ASPIRIN EC 81 MG PO TBEC: 81.0000 mg | DELAYED_RELEASE_TABLET | Freq: Every day | ORAL | Status: DC

## 2021-05-17 NOTE — ED Notes (Addendum)
Give dose of lopressor now per hospitalist

## 2021-05-17 NOTE — ED Provider Notes (Signed)
[facility] EMERGENCY DEPARTMENT Provider Note   CSN: [id number] Arrival date & time: 05/17/21  1142     History Chief Complaint  Patient presents with   Foot Pain    Tammy Mcpherson is a 65 y.o. adult.   Foot Pain Pertinent negatives include no chest pain, no abdominal pain and no shortness of breath.       Tammy Mcpherson is a 65 y.o. adult with past medical history of PAD, anemia, anxiety, CHF, COPD, hypertension, type 2 diabetes who presents to the Emergency Department from the local urgent care for evaluation of possible ischemic right foot.  Patient complains of discoloration and pain to the right foot and especially the right second and third toes.  Patient states that she "stumped" her toe 2 weeks ago.  No fall.  1 week ago, she noticed splotchy discoloration and redness of her right foot and increasing pain of her toes.  She has been taking Tylenol without relief.  She went to urgent care earlier today and was sent here for further evaluation.  She denies chest pain, shortness of breath, history of prior DVT/PE.  She also denies any pain proximal to the right ankle. Continues to smoke.  Past Medical History:  Diagnosis Date   Anemia 04/30/2016   Anxiety    Blood transfusion without reported diagnosis    Cardiomyopathy ([place])    a. EF 40-45% by echo in 04/2016 b. Improved to 60-65% by repeat imaging in 2018   CHF (congestive heart failure) (HCC)    COPD (chronic obstructive pulmonary disease) ([place])    Coronary artery calcification seen on CT scan    Essential hypertension    GERD (gastroesophageal reflux disease)    History of bronchitis    History of GI bleed    Hyperlipidemia    Iron deficiency anemia 05/12/2016   Bleeding ulcers   Leukocytosis 03/23/2020   Pollen allergy    PSVT (paroxysmal supraventricular tachycardia) (HCC)    PVC's (premature ventricular contractions)    Thrombocytosis 03/23/2020   Vitamin B12 deficiency 03/23/2020    Patient Active Problem List    Diagnosis Date Noted   Emphysema of lung ([place]) 02/21/2021   Adrenal hyperplasia ([place]) 08/18/2020   Neutrophilia 03/23/2020   Thrombocytosis 03/23/2020   Vitamin B12 deficiency 03/23/2020   Type 2 diabetes mellitus with hyperglycemia, without long-term current use of insulin ([place]) 12/23/2019   Diabetes mellitus ([place]) 12/23/2019   Type 2 diabetes mellitus ([place]) 11/12/2019   Healthcare maintenance 11/03/2019   Ganglion cyst of volar aspect of right wrist 05/23/2019   Neuropathy due to type 2 diabetes mellitus ([place]) 04/22/2019   Need for hepatitis C screening test 11/20/2018   Prediabetes 03/01/2018   Diarrhea 02/07/2018   Lesion of liver    Gastric AVM    AVM (arteriovenous malformation) of small bowel, acquired    Vitamin D deficiency 01/15/2017   PAD (peripheral artery disease) ([place]) 01/15/2017   Aortic atherosclerosis ([place]) 01/01/2017   Gastric ulcer 01/01/2017   Hyperlipidemia 01/01/2017   Dyspnea on effort 01/01/2017   Anemia, iron deficiency 05/12/2016   Abdominal pain 05/12/2016   LLQ abdominal pain 05/12/2016   Nonischemic cardiomyopathy ([place]) 05/02/2016   CAD-Ca++ coronaries on CTA 05/02/2016   Acute combined systolic and diastolic heart failure ([place]) 04/30/2016   Chronic obstructive pulmonary disease ([place]) 04/30/2016   Essential hypertension 04/30/2016   Tobacco abuse 04/30/2016    Past Surgical History:  Procedure Laterality Date   ABDOMINAL HYSTERECTOMY  polyps  about age 39   AIKEN OSTEOTOMY Right 07/10/2013   Procedure: Barbie Banner OSTEOTOMY RIGHT FOOT;  Surgeon: Marcheta Grammes, DPM;  Location: [facility];  Service: Orthopedics;  Laterality: Right;   AMPUTATION Left 05/09/2017   Procedure: AMPUTATION 5TH TOE LEFT FOOT;  Surgeon: [name], DPM;  Location: [facility];  Service: Podiatry;  Laterality: Left;   AMPUTATION Left 06/13/2017   Procedure: AMPUTATION 2ND TOE LEFT FOOT;  Surgeon: [name], DPM;  Location: [facility];  Service: Podiatry;   Laterality: Left;   BUNIONECTOMY Right 07/10/2013   Procedure: VOGLER BUNIONECTOMY RIGHT FOOT;  Surgeon: Marcheta Grammes, DPM;  Location: [facility];  Service: Orthopedics;  Laterality: Right;   CHOLECYSTECTOMY N/A 08/22/2017   Procedure: LAPAROSCOPIC CHOLECYSTECTOMY;  Surgeon: Virl Cagey, MD;  Location: [facility];  Service: General;  Laterality: N/A;   COLONOSCOPY N/A 07/07/2016   Dr. Oneida Alar; redundant left colon, diverticulosis at hepatic flexure, non-bleeding internal hemorrhoids   ESOPHAGOGASTRODUODENOSCOPY N/A 07/07/2016   Dr. Oneida Alar: many non-bleeding cratered gastric ulcers without stigmata of bleeding in gastric antrum. four 2-3 mm angioectasias without bleeding in duodenal bulb and second portion of duodenum s/p APC. Chroni gastritis on path.    ESOPHAGOGASTRODUODENOSCOPY N/A 08/03/2017   Dr. Oneida Alar: erosive gastritis, AVMs. Found a single non-bleeding angioectasia in stomach, s/p APC therapy. Four non-bleeding angioectasias in duodenum s/p APC. Non-bleeding erosive gastropathy   IR ANGIOGRAM EXTREMITY LEFT  04/24/2017   IR FEM POP ART ATHERECT INC PTA MOD SED  04/24/2017   IR INFUSION THROMBOL ARTERIAL INITIAL (MS)  04/24/2017   IR RADIOLOGIST EVAL & MGMT  12/05/2016   IR US GUIDE VASC ACCESS RIGHT  04/24/2017   LIVER BIOPSY N/A 08/22/2017   Procedure: LIVER BIOPSY;  Surgeon: Virl Cagey, MD;  Location: [facility];  Service: General;  Laterality: N/A;   METATARSAL HEAD EXCISION Right 07/10/2013   Procedure: METATARSAL HEAD RESECTION OF DIGITS 2 AND 3 RIGHT FOOT;  Surgeon: Marcheta Grammes, DPM;  Location: [facility];  Service: Orthopedics;  Laterality: Right;   PROXIMAL INTERPHALANGEAL FUSION (PIP) Right 07/10/2013   Procedure: ARTHRODESIS PIPJ  2ND DIGIT RIGHT FOOT;  Surgeon: Marcheta Grammes, DPM;  Location: [facility];  Service: Orthopedics;  Laterality: Right;     OB History   No obstetric history on file.     Family History  Adopted: Yes  Problem Relation Age of Onset    Hypertension Father    Coronary artery disease Sister    Ulcers Maternal Grandmother    Colon cancer Neg Hx        unknown, was adopted    Social History   Tobacco Use   Smoking status: Light Smoker    Packs/day: 0.25    Years: 44.00    Pack years: 11.00    Types: Cigarettes    Start date: 05/10/1975   Smokeless tobacco: Never   Tobacco comments:    2 cigarettes per day 12/10/2019  Vaping Use   Vaping Use: Former  Substance Use Topics   Alcohol use: Not Currently    Alcohol/week: 2.0 standard drinks    Types: 2 Glasses of wine per week   Drug use: No    Home Medications Prior to Admission medications   Medication Sig Start Date End Date Taking? Authorizing Provider  acetaminophen (TYLENOL) 500 MG tablet Take 500 mg by mouth at bedtime as needed for moderate pain.    [provider]  albuterol (PROVENTIL) (2.5 MG/3ML) 0.083% nebulizer solution Take  3 mLs (2.5 mg total) by nebulization every 4 (four) hours as needed for wheezing or shortness of breath. 11/15/20   Icard, Octavio Graves, DO  albuterol (VENTOLIN HFA) 108 (90 Base) MCG/ACT inhaler INHALE TWO PUFFS INTO THE LUNGS EVERY SIX HOURS AS NEEDED FOR WHEEZING OR SHORTNESS OF BREATH 12/15/20   Icard, Octavio Graves, DO  ALPRAZolam Duanne Moron) 0.5 MG tablet TAKE 1 TABLET BY MOUTH AT BEDTIME AS NEEDED FOR ANXIETY 01/26/21   [name], NP  aspirin 81 MG chewable tablet Chew 81 mg by mouth every morning.     [provider]  atorvastatin (LIPITOR) 20 MG tablet TAKE ONE TABLET (20MG TOTAL) BY MOUTH DAILY 01/11/21   Satira Sark, MD  bisoprolol (ZEBETA) 10 MG tablet TAKE ONE (1) TABLET BY MOUTH EVERY DAY 10/19/20   Satira Sark, MD  blood glucose meter kit and supplies Dispense based on patient and insurance preference. Use up to four times daily as directed. (FOR ICD-10 E10.9, E11.9). 11/22/20   [name], NP  Budeson-Glycopyrrol-Formoterol (BREZTRI AEROSPHERE) 160-9-4.8 MCG/ACT AERO Inhale 2 puffs into the  lungs 2 (two) times daily. 11/15/20   Garner Nash, DO  Cholecalciferol (VITAMIN D) 50 MCG (2000 UT) CAPS Take 1 capsule (2,000 Units total) by mouth daily. 03/10/21   Derek Jack, MD  Continuous Blood Gluc Receiver (FREESTYLE LIBRE 14 DAY READER) [place] See admin instructions. 09/28/20   [name], NP  Continuous Blood Gluc Receiver (FREESTYLE LIBRE 2 READER) [place] Check blood glucose up to 4 times daily 12/10/20   [name], NP  cyanocobalamin (,VITAMIN B-12,) 1000 MCG/ML injection INJECT 1 ML INTO THE MUSCLE EVERY 30 DAYS. 03/10/21   Derek Jack, MD  cyclobenzaprine (FLEXERIL) 5 MG tablet TAKE ONE TABLET BY MOUTH UP TO EVERY 8 HOURS AS NEEDED. START WITH ONE TABLET AT BEDTIME AS NEEDED DUE TO SEDATION. 02/21/21   [name], NP  diclofenac (VOLTAREN) 75 MG EC tablet Take 1 tablet (75 mg total) by mouth 2 (two) times daily. 05/10/21   [name], NP  dicyclomine (BENTYL) 10 MG capsule Take 10 mg by mouth as needed for spasms.    [provider]  empagliflozin (JARDIANCE) 10 MG TABS tablet Take 10 mg by mouth daily before breakfast. 12/23/19   Shamleffer, Melanie Crazier, MD  FLUoxetine (PROZAC) 20 MG capsule Take 1 capsule (20 mg total) by mouth daily. 04/26/21   [name], NP  gabapentin (NEURONTIN) 300 MG capsule TAKE ONE CAPSULE (300 MG TOTAL) BY MOUTHTWO TIMES DAILY. 05/03/21   [name], NP  glucose blood [facility] ULTRA) test strip Use as instructed 02/26/19   Letta Median K, DO  glucose monitoring kit (FREESTYLE) monitoring kit Use to check blood sugar BID as directed 01/24/19   Cirigliano, Stanton Kidney K, DO  lactase (LACTAID) 3000 units tablet Take by mouth as needed.    [provider]  Lancets [facility] DELICA PLUS [id number]) [place] 1 each by Does not apply route 2 (two) times a day. 02/26/19   Cirigliano, Garvin Fila, DO  losartan (COZAAR) 100 MG tablet TAKE ONE (1) TABLET BY MOUTH EVERY DAY 10/04/20   Satira Sark, MD  metFORMIN  (GLUCOPHAGE) 1000 MG tablet Take 0.5 tablets (500 mg total) by mouth 2 (two) times daily with a meal. Takes 500 mg twice daily.  Total of 1000 mg daily. 12/15/20   [name], NP  Misc. Devices MISC Please provide supplies (needle, syringe, alcohol swabs) needed for patient to self-administer B-12 injections monthly. 01/07/20  Derek Jack, MD  pantoprazole (PROTONIX) 40 MG tablet TAKE ONE (1) TABLET 30 MINS BEFORE YOUR FIRST MEAL. 09/02/20   Eloise Harman, DO  spironolactone (ALDACTONE) 50 MG tablet Take 1 tablet (50 mg total) by mouth daily. 12/07/20   Cassandria Anger, MD  [employer]ARE TUBERCULIN SAFETY SYR 25G X 1" 1 ML MISC USE TO ADMINISTER INJECTABLE VITAMIN B12. 05/06/21   Pennington, Rebekah M, PA-C    Allergies    Bee venom, Codeine, Penicillins, Bupropion, and Sudafed [pseudoephedrine hcl]  Review of Systems   Review of Systems  Constitutional:  Negative for chills, fatigue and fever.  Respiratory:  Negative for shortness of breath.   Cardiovascular:  Negative for chest pain.  Gastrointestinal:  Negative for abdominal pain, blood in stool, nausea and vomiting.  Musculoskeletal:  Positive for arthralgias (Right foot pain). Negative for back pain and myalgias.  Skin:  Positive for color change. Negative for rash.  Neurological:  Negative for dizziness, weakness and numbness.  Hematological:  Does not bruise/bleed easily.   Physical Exam Updated Vital Signs BP (!) 145/104 (BP Location: Right Arm)   Pulse (!) 104   Temp 98.8 F (37.1 C) (Oral)   Resp 17   Ht '5\' 5"'  (1.651 m)   Wt 63 kg   SpO2 97%   BMI 23.13 kg/m   Physical Exam Vitals and nursing note reviewed.  Constitutional:      Appearance: Normal appearance. She is not ill-appearing.  Cardiovascular:     Rate and Rhythm: Normal rate and regular rhythm.     Comments: Unable to palpate or hear dorsalis pedis or posterior tibial pulses of the right foot.  Strong palpable DP pulse of the left  foot. Pulmonary:     Effort: Pulmonary effort is normal. No respiratory distress.  Chest:     Chest wall: No tenderness.  Abdominal:     Palpations: Abdomen is soft.     Tenderness: There is no abdominal tenderness.  Musculoskeletal:        General: Tenderness present.     Cervical back: Normal range of motion.     Comments: Splotchy erythema of the dorsal and plantar surfaces of the right foot.  Exquisitely tender to palpation to the right second and third toes.  Second toe is dusky and cap refill is 3 seconds.  She also has tenderness palpation of the dorsal foot and proximal right great toe.  Skin:    General: Skin is warm.     Findings: Erythema present.  Neurological:     General: No focal deficit present.     Mental Status: She is alert.     Sensory: No sensory deficit.     Motor: No weakness.        ED Results / Procedures / Treatments   Labs (all labs ordered are listed, but only abnormal results are displayed) Labs Reviewed  CBC WITH DIFFERENTIAL/PLATELET - Abnormal; Notable for the following components:      Result Value   WBC 16.1 (*)    Hemoglobin 15.3 (*)    HCT 46.6 (*)    Neutro Abs 12.5 (*)    Monocytes Absolute 1.4 (*)    Abs Immature Granulocytes 0.12 (*)    All other components within normal limits  BASIC METABOLIC PANEL - Abnormal; Notable for the following components:   Glucose, Bld 144 (*)    All other components within normal limits    EKG None  Radiology [place] Lower Ext Art Right  Result Date: 05/17/2021 CLINICAL DATA:  65 year old female with discoloration and right foot pain EXAM: RIGHT LOWER EXTREMITY ARTERIAL DUPLEX SCAN TECHNIQUE: Gray-scale sonography as well as color Doppler and duplex ultrasound was performed to evaluate the lower extremity arteries including the common, superficial and profunda femoral arteries, popliteal artery and calf arteries. COMPARISON:  None. FINDINGS: Right lower Extremity ABI not acquired Directed duplex right  lower extremity demonstrates triphasic common femoral artery, profunda femoris. Triphasic SFA throughout its length. Monophasic popliteal artery, posterior tibial artery, peroneal artery, anterior tibial artery. IMPRESSION: Directed duplex of the right lower extremity demonstrates waveforms maintained in the common femoral artery and length of the SFA. Duplex demonstrates occlusive disease of the distal SFA/popliteal artery, with monophasic waveform of the popliteal artery and tibial arteries. Signed, Dulcy Fanny. Dellia Nims, RPVI [facility] Radiology Electronically Signed   By: Corrie Mckusick D.O.   On: 05/17/2021 15:29    Procedures Procedures   Medications Ordered in ED Medications  fentaNYL (SUBLIMAZE) injection 25 mcg (has no administration in time range)    ED Course  I have reviewed the triage vital signs and the nursing notes.  Pertinent labs & imaging results that were available during my care of the patient were reviewed by me and considered in my medical decision making (see chart for details).    MDM Rules/Calculators/A&P                           Patient sent here for evaluation of possible ischemic foot.  Has history of diabetes, hypertension and is a current smoker.  On exam, patient has erythematous dusky right foot, worse to the right second and third toes.  I have been unable to hear or palpate dorsalis pedis or posterior tibial pulses.  Will obtain labs and arterial ultrasound of the foot, labs are pending.  She may need CT angio of the foot.  Review of prior labs, patient had normal serum creatinine on 04/26/2021.  Ultrasound shows duplex occlusive disease in the distal SFA/popliteal artery with monophasic waveform in the popliteal and tibial arteries.  1 week history of symptoms, I will consult vascular.  1620 Discussed findings with Dr. Scot Dock who recommends patient be admitted to hospitalist service and transferred to [facility]  for further evaluation.  1715 discussed findings with [facility] hospitalist, Dr. Kennon Rounds who agrees to admit for transfer to [facility].  Final Clinical Impression(s) / ED Diagnoses Final diagnoses:  Ischemic foot    Rx / DC Orders ED Discharge Orders     None        Bufford Lope 05/17/21 2054    [place] Ferguson, MD 05/18/21 [phone]

## 2021-05-17 NOTE — Progress Notes (Signed)
Receiving RN noticed patient was OTF in status mode, called to see if pt in transit because receiving RN did not receive report.  RN was able to receive report at this time from Leverne Humbles, RN, pt currently in route with EMS.

## 2021-05-17 NOTE — ED Notes (Addendum)
Messaged hospitalist for something for pain for patient. Told medication will be ordered

## 2021-05-17 NOTE — ED Provider Notes (Signed)
[facility]    CSN: [id number] Arrival date & time: 05/17/21  0948      History   Chief Complaint Chief Complaint  Patient presents with   Foot Pain    Right     HPI Tammy Mcpherson is a 65 y.o. adult.   HPI Patient with a known history of PAD, CHF, coronary artery disease, COPD and a current smoker presents today for evaluation 2 weeks of right foot pain, redness and trace swelling of the palmar surface of her foot. She reports hitting her foot on table > 4 weeks ago however did not sustain an known injury at that time.  The palmar surface of her foot is malted, discolored with a purplish hue she is severely tender to touch.  Her second is completely blue she reports noticing this about 2 days ago when she has no sensation involving the second toe.  She has diffuse swelling of the great toe along with marked discoloration, marked thickening of toenails, and multiple abrasions on the toes.  She is in severe pain.  Unable to readily palpate a DP or PT pulse.  Past Medical History:  Diagnosis Date   Anemia 04/30/2016   Anxiety    Blood transfusion without reported diagnosis    Cardiomyopathy ([place])    a. EF 40-45% by echo in 04/2016 b. Improved to 60-65% by repeat imaging in 2018   CHF (congestive heart failure) (HCC)    COPD (chronic obstructive pulmonary disease) (HCC)    Coronary artery calcification seen on CT scan    Essential hypertension    GERD (gastroesophageal reflux disease)    History of bronchitis    History of GI bleed    Hyperlipidemia    Iron deficiency anemia 05/12/2016   Bleeding ulcers   Leukocytosis 03/23/2020   Pollen allergy    PSVT (paroxysmal supraventricular tachycardia) (HCC)    PVC's (premature ventricular contractions)    Thrombocytosis 03/23/2020   Vitamin B12 deficiency 03/23/2020    Patient Active Problem List   Diagnosis Date Noted   Emphysema of lung ([place]) 02/21/2021   Adrenal hyperplasia ([place]) 08/18/2020   Neutrophilia  03/23/2020   Thrombocytosis 03/23/2020   Vitamin B12 deficiency 03/23/2020   Type 2 diabetes mellitus with hyperglycemia, without long-term current use of insulin ([place]) 12/23/2019   Diabetes mellitus ([place]) 12/23/2019   Type 2 diabetes mellitus ([place]) 11/12/2019   Healthcare maintenance 11/03/2019   Ganglion cyst of volar aspect of right wrist 05/23/2019   Neuropathy due to type 2 diabetes mellitus ([place]) 04/22/2019   Need for hepatitis C screening test 11/20/2018   Prediabetes 03/01/2018   Diarrhea 02/07/2018   Lesion of liver    Gastric AVM    AVM (arteriovenous malformation) of small bowel, acquired    Vitamin D deficiency 01/15/2017   PAD (peripheral artery disease) ([place]) 01/15/2017   Aortic atherosclerosis ([place]) 01/01/2017   Gastric ulcer 01/01/2017   Hyperlipidemia 01/01/2017   Dyspnea on effort 01/01/2017   Anemia, iron deficiency 05/12/2016   Abdominal pain 05/12/2016   LLQ abdominal pain 05/12/2016   Nonischemic cardiomyopathy ([place]) 05/02/2016   CAD-Ca++ coronaries on CTA 05/02/2016   Acute combined systolic and diastolic heart failure ([place]) 04/30/2016   Chronic obstructive pulmonary disease ([place]) 04/30/2016   Essential hypertension 04/30/2016   Tobacco abuse 04/30/2016    Past Surgical History:  Procedure Laterality Date   ABDOMINAL HYSTERECTOMY     polyps  about age 27   AIKEN OSTEOTOMY Right 07/10/2013  Procedure: Barbie Banner OSTEOTOMY RIGHT FOOT;  Surgeon: Marcheta Grammes, DPM;  Location: [facility];  Service: Orthopedics;  Laterality: Right;   AMPUTATION Left 05/09/2017   Procedure: AMPUTATION 5TH TOE LEFT FOOT;  Surgeon: [name], DPM;  Location: [facility];  Service: Podiatry;  Laterality: Left;   AMPUTATION Left 06/13/2017   Procedure: AMPUTATION 2ND TOE LEFT FOOT;  Surgeon: [name], DPM;  Location: [facility];  Service: Podiatry;  Laterality: Left;   BUNIONECTOMY Right 07/10/2013   Procedure: VOGLER BUNIONECTOMY RIGHT FOOT;  Surgeon: Marcheta Grammes, DPM;  Location: [facility];  Service: Orthopedics;  Laterality: Right;   CHOLECYSTECTOMY N/A 08/22/2017   Procedure: LAPAROSCOPIC CHOLECYSTECTOMY;  Surgeon: Virl Cagey, MD;  Location: [facility];  Service: General;  Laterality: N/A;   COLONOSCOPY N/A 07/07/2016   Dr. Oneida Alar; redundant left colon, diverticulosis at hepatic flexure, non-bleeding internal hemorrhoids   ESOPHAGOGASTRODUODENOSCOPY N/A 07/07/2016   Dr. Oneida Alar: many non-bleeding cratered gastric ulcers without stigmata of bleeding in gastric antrum. four 2-3 mm angioectasias without bleeding in duodenal bulb and second portion of duodenum s/p APC. Chroni gastritis on path.    ESOPHAGOGASTRODUODENOSCOPY N/A 08/03/2017   Dr. Oneida Alar: erosive gastritis, AVMs. Found a single non-bleeding angioectasia in stomach, s/p APC therapy. Four non-bleeding angioectasias in duodenum s/p APC. Non-bleeding erosive gastropathy   IR ANGIOGRAM EXTREMITY LEFT  04/24/2017   IR FEM POP ART ATHERECT INC PTA MOD SED  04/24/2017   IR INFUSION THROMBOL ARTERIAL INITIAL (MS)  04/24/2017   IR RADIOLOGIST EVAL & MGMT  12/05/2016   IR US GUIDE VASC ACCESS RIGHT  04/24/2017   LIVER BIOPSY N/A 08/22/2017   Procedure: LIVER BIOPSY;  Surgeon: Virl Cagey, MD;  Location: [facility];  Service: General;  Laterality: N/A;   METATARSAL HEAD EXCISION Right 07/10/2013   Procedure: METATARSAL HEAD RESECTION OF DIGITS 2 AND 3 RIGHT FOOT;  Surgeon: Marcheta Grammes, DPM;  Location: [facility];  Service: Orthopedics;  Laterality: Right;   PROXIMAL INTERPHALANGEAL FUSION (PIP) Right 07/10/2013   Procedure: ARTHRODESIS PIPJ  2ND DIGIT RIGHT FOOT;  Surgeon: Marcheta Grammes, DPM;  Location: [facility];  Service: Orthopedics;  Laterality: Right;    OB History   No obstetric history on file.      Home Medications    Prior to Admission medications   Medication Sig Start Date End Date Taking? Authorizing Provider  acetaminophen (TYLENOL) 500 MG tablet Take 500 mg by mouth  at bedtime as needed for moderate pain.    [provider]  albuterol (PROVENTIL) (2.5 MG/3ML) 0.083% nebulizer solution Take 3 mLs (2.5 mg total) by nebulization every 4 (four) hours as needed for wheezing or shortness of breath. 11/15/20   Icard, Octavio Graves, DO  albuterol (VENTOLIN HFA) 108 (90 Base) MCG/ACT inhaler INHALE TWO PUFFS INTO THE LUNGS EVERY SIX HOURS AS NEEDED FOR WHEEZING OR SHORTNESS OF BREATH 12/15/20   Icard, Octavio Graves, DO  ALPRAZolam Duanne Moron) 0.5 MG tablet TAKE 1 TABLET BY MOUTH AT BEDTIME AS NEEDED FOR ANXIETY 01/26/21   [name], NP  aspirin 81 MG chewable tablet Chew 81 mg by mouth every morning.     [provider]  atorvastatin (LIPITOR) 20 MG tablet TAKE ONE TABLET (20MG TOTAL) BY MOUTH DAILY 01/11/21   Satira Sark, MD  bisoprolol (ZEBETA) 10 MG tablet TAKE ONE (1) TABLET BY MOUTH EVERY DAY 10/19/20   Satira Sark, MD  blood glucose meter kit and supplies Dispense based on patient and insurance  preference. Use up to four times daily as directed. (FOR ICD-10 E10.9, E11.9). 11/22/20   [name], NP  Budeson-Glycopyrrol-Formoterol (BREZTRI AEROSPHERE) 160-9-4.8 MCG/ACT AERO Inhale 2 puffs into the lungs 2 (two) times daily. 11/15/20   Garner Nash, DO  Cholecalciferol (VITAMIN D) 50 MCG (2000 UT) CAPS Take 1 capsule (2,000 Units total) by mouth daily. 03/10/21   Derek Jack, MD  Continuous Blood Gluc Receiver (FREESTYLE LIBRE 14 DAY READER) [place] See admin instructions. 09/28/20   [name], NP  Continuous Blood Gluc Receiver (FREESTYLE LIBRE 2 READER) [place] Check blood glucose up to 4 times daily 12/10/20   [name], NP  cyanocobalamin (,VITAMIN B-12,) 1000 MCG/ML injection INJECT 1 ML INTO THE MUSCLE EVERY 30 DAYS. 03/10/21   Derek Jack, MD  cyclobenzaprine (FLEXERIL) 5 MG tablet TAKE ONE TABLET BY MOUTH UP TO EVERY 8 HOURS AS NEEDED. START WITH ONE TABLET AT BEDTIME AS NEEDED DUE TO SEDATION. 02/21/21   [name], NP  diclofenac (VOLTAREN) 75 MG EC tablet Take 1 tablet (75 mg total) by mouth 2 (two) times daily. 05/10/21   [name], NP  dicyclomine (BENTYL) 10 MG capsule Take 10 mg by mouth as needed for spasms.    [provider]  empagliflozin (JARDIANCE) 10 MG TABS tablet Take 10 mg by mouth daily before breakfast. 12/23/19   Shamleffer, Melanie Crazier, MD  FLUoxetine (PROZAC) 20 MG capsule Take 1 capsule (20 mg total) by mouth daily. 04/26/21   [name], NP  gabapentin (NEURONTIN) 300 MG capsule TAKE ONE CAPSULE (300 MG TOTAL) BY MOUTHTWO TIMES DAILY. 05/03/21   [name], NP  glucose blood [facility] ULTRA) test strip Use as instructed 02/26/19   Letta Median K, DO  glucose monitoring kit (FREESTYLE) monitoring kit Use to check blood sugar BID as directed 01/24/19   Cirigliano, Stanton Kidney K, DO  lactase (LACTAID) 3000 units tablet Take by mouth as needed.    [provider]  Lancets [facility] DELICA PLUS [id number]) [place] 1 each by Does not apply route 2 (two) times a day. 02/26/19   Cirigliano, Garvin Fila, DO  losartan (COZAAR) 100 MG tablet TAKE ONE (1) TABLET BY MOUTH EVERY DAY 10/04/20   Satira Sark, MD  metFORMIN (GLUCOPHAGE) 1000 MG tablet Take 0.5 tablets (500 mg total) by mouth 2 (two) times daily with a meal. Takes 500 mg twice daily.  Total of 1000 mg daily. 12/15/20   [name], NP  Misc. Devices MISC Please provide supplies (needle, syringe, alcohol swabs) needed for patient to self-administer B-12 injections monthly. 01/07/20   Derek Jack, MD  pantoprazole (PROTONIX) 40 MG tablet TAKE ONE (1) TABLET 30 MINS BEFORE YOUR FIRST MEAL. 09/02/20   Eloise Harman, DO  spironolactone (ALDACTONE) 50 MG tablet Take 1 tablet (50 mg total) by mouth daily. 12/07/20   Cassandria Anger, MD  ULTICARE TUBERCULIN SAFETY SYR 25G X 1" 1 ML MISC USE TO ADMINISTER INJECTABLE VITAMIN B12. 05/06/21   Harriett Rush, PA-C    Family History Family History   Adopted: Yes  Problem Relation Age of Onset   Hypertension Father    Coronary artery disease Sister    Ulcers Maternal Grandmother    Colon cancer Neg Hx        unknown, was adopted    Social History Social History   Tobacco Use   Smoking status: Light Smoker    Packs/day: 0.25    Years: 44.00    Pack years: 11.00  Types: Cigarettes    Start date: 05/10/1975   Smokeless tobacco: Never   Tobacco comments:    2 cigarettes per day 12/10/2019  Vaping Use   Vaping Use: Former  Substance Use Topics   Alcohol use: Not Currently    Alcohol/week: 2.0 standard drinks    Types: 2 Glasses of wine per week   Drug use: No     Allergies   Bee venom, Codeine, Penicillins, Bupropion, and Sudafed [pseudoephedrine hcl]   Review of Systems Review of Systems Pertinent negatives listed in HPI   Physical Exam Triage Vital Signs ED Triage Vitals  Enc Vitals Group     BP 05/17/21 1030 (!) 177/79     Pulse Rate 05/17/21 1030 98     Resp 05/17/21 1030 17     Temp 05/17/21 1030 98 F (36.7 C)     Temp src --      SpO2 05/17/21 1030 94 %     Weight --      Height --      Head Circumference --      Peak Flow --      Pain Score 05/17/21 1033 10     Pain Loc --      Pain Edu? --      Excl. in [place]? --    No data found.  Updated Vital Signs BP (!) 177/79   Pulse 98   Temp 98 F (36.7 C)   Resp 17   SpO2 94%   Visual Acuity Right Eye Distance:   Left Eye Distance:   Bilateral Distance:    Right Eye Near:   Left Eye Near:    Bilateral Near:     Physical Exam Cardiovascular:     Comments: Unable to palpate DP or PT pulse of right foot.  No Doppler available here in clinic Gross discoloration noted involving the second toe of the right foot.  Mild discoloration noted in the great toe diffuse molted appearing skin color with discoloration noted on the palmar surface of right foot Thickened yellow toenails present Musculoskeletal:     Right lower leg: No edema.     Left  lower leg: No edema.      UC Treatments / Results  Labs (all labs ordered are listed, but only abnormal results are displayed) Labs Reviewed - No data to display  EKG   Radiology No results found.  Procedures Procedures (including critical care time)  Medications Ordered in UC Medications - No data to display  Initial Impression / Assessment and Plan / UC Course  I have reviewed the triage vital signs and the nursing notes.  Pertinent labs & imaging results that were available during my care of the patient were reviewed by me and considered in my medical decision making (see chart for details).    Given overall appearance of right foot, discoloration  second right toe, loss of tactile sensation of the second right toe , inability to palpate DP or PT pulse, underlying conditions of PAD, chronic smoking, and diabetes, Patient has been sent to the ER for further work-up pending evaluation to rule out acute on chronic vascular disease resulting in vascular occlusion involving the right foot.  Patient placed in a temporary postop shoe as she is unable to insert her foot back into her close toe shoe without severe discomfort.  Patient is stable for self transport.  She is going immediately to the emergency department.   Final Clinical Impressions(s) / UC Diagnoses  Final diagnoses:  Vascular abnormality Right Foot   PAD (peripheral artery disease) ([place])  Diabetic foot [facility])   Discharge Instructions   None    ED Prescriptions   None    PDMP not reviewed this encounter.   Scot Jun, FNP 05/17/21 [phone]

## 2021-05-17 NOTE — ED Triage Notes (Signed)
Pt is present today with right foot pain. Pt states that she hit her foot on a table leg x1 month ago and noticed the bruising and swelling  x1 week ago. Pt does have visible bruising and swelling.

## 2021-05-17 NOTE — ED Notes (Signed)
c- [facility] called for transport at this time. Tammy Mcpherson

## 2021-05-17 NOTE — ED Triage Notes (Signed)
Pt seen at UC this morning, pt sent to ED for ultrasound of lower right leg due to discoloration to right toe.

## 2021-05-17 NOTE — H&P (Addendum)
History and Physical    Tammy Mcpherson SFK:[id number] DOB: 08-09-1956 DOA: 05/17/2021  PCP: [name], NP  Patient coming from: right foot pain  I have personally briefly reviewed patient's old medical records in [place]  Chief Complaint: right foot pain  HPI: Tammy Mcpherson is a 66 y.o. adult with medical history significant of T2DM, HTN, HLD, PAD and on-going tobacco use who has 1-2 week h/o right foot pain. Notes she thought she stubbed her toe. Foot has been cold and discolored. Notes increasing pain which has not improved with tylenol.  ED Course: Has elevated WBC and imaging findings of occlusive disease of the SFA and popliteal and tibial arteries. Discussed with vascular surgery who recommended transfer to [facility] for management.  Review of Systems: As per HPI otherwise 10 point review of systems negative.   Past Medical History:  Diagnosis Date   Anemia 04/30/2016   Anxiety    Blood transfusion without reported diagnosis    Cardiomyopathy ([place])    a. EF 40-45% by echo in 04/2016 b. Improved to 60-65% by repeat imaging in 2018   CHF (congestive heart failure) (HCC)    COPD (chronic obstructive pulmonary disease) (HCC)    Coronary artery calcification seen on CT scan    Essential hypertension    GERD (gastroesophageal reflux disease)    History of bronchitis    History of GI bleed    Hyperlipidemia    Iron deficiency anemia 05/12/2016   Bleeding ulcers   Leukocytosis 03/23/2020   Pollen allergy    PSVT (paroxysmal supraventricular tachycardia) (HCC)    PVC's (premature ventricular contractions)    Thrombocytosis 03/23/2020   Vitamin B12 deficiency 03/23/2020    Past Surgical History:  Procedure Laterality Date   ABDOMINAL HYSTERECTOMY     polyps  about age 78   AIKEN OSTEOTOMY Right 07/10/2013   Procedure: Barbie Banner OSTEOTOMY RIGHT FOOT;  Surgeon: Marcheta Grammes, DPM;  Location: [facility];  Service: Orthopedics;  Laterality: Right;   AMPUTATION Left  05/09/2017   Procedure: AMPUTATION 5TH TOE LEFT FOOT;  Surgeon: [name], DPM;  Location: [facility];  Service: Podiatry;  Laterality: Left;   AMPUTATION Left 06/13/2017   Procedure: AMPUTATION 2ND TOE LEFT FOOT;  Surgeon: [name], DPM;  Location: [facility];  Service: Podiatry;  Laterality: Left;   BUNIONECTOMY Right 07/10/2013   Procedure: VOGLER BUNIONECTOMY RIGHT FOOT;  Surgeon: Marcheta Grammes, DPM;  Location: [facility];  Service: Orthopedics;  Laterality: Right;   CHOLECYSTECTOMY N/A 08/22/2017   Procedure: LAPAROSCOPIC CHOLECYSTECTOMY;  Surgeon: Virl Cagey, MD;  Location: [facility];  Service: General;  Laterality: N/A;   COLONOSCOPY N/A 07/07/2016   Dr. Oneida Alar; redundant left colon, diverticulosis at hepatic flexure, non-bleeding internal hemorrhoids   ESOPHAGOGASTRODUODENOSCOPY N/A 07/07/2016   Dr. Oneida Alar: many non-bleeding cratered gastric ulcers without stigmata of bleeding in gastric antrum. four 2-3 mm angioectasias without bleeding in duodenal bulb and second portion of duodenum s/p APC. Chroni gastritis on path.    ESOPHAGOGASTRODUODENOSCOPY N/A 08/03/2017   Dr. Oneida Alar: erosive gastritis, AVMs. Found a single non-bleeding angioectasia in stomach, s/p APC therapy. Four non-bleeding angioectasias in duodenum s/p APC. Non-bleeding erosive gastropathy   IR ANGIOGRAM EXTREMITY LEFT  04/24/2017   IR FEM POP ART ATHERECT INC PTA MOD SED  04/24/2017   IR INFUSION THROMBOL ARTERIAL INITIAL (MS)  04/24/2017   IR RADIOLOGIST EVAL & MGMT  12/05/2016   IR US GUIDE VASC ACCESS RIGHT  04/24/2017   LIVER  BIOPSY N/A 08/22/2017   Procedure: LIVER BIOPSY;  Surgeon: Virl Cagey, MD;  Location: [facility];  Service: General;  Laterality: N/A;   METATARSAL HEAD EXCISION Right 07/10/2013   Procedure: METATARSAL HEAD RESECTION OF DIGITS 2 AND 3 RIGHT FOOT;  Surgeon: Marcheta Grammes, DPM;  Location: [facility];  Service: Orthopedics;  Laterality: Right;   PROXIMAL INTERPHALANGEAL FUSION  (PIP) Right 07/10/2013   Procedure: ARTHRODESIS PIPJ  2ND DIGIT RIGHT FOOT;  Surgeon: Marcheta Grammes, DPM;  Location: [facility];  Service: Orthopedics;  Laterality: Right;     reports that she has been smoking cigarettes. She started smoking about 46 years ago. She has a 11.00 pack-year smoking history. She has never used smokeless tobacco. She reports that she does not currently use alcohol after a past usage of about 2.0 standard drinks per week. She reports that she does not use drugs.  Allergies  Allergen Reactions   Bee Venom Swelling   Codeine Anaphylaxis    Tongue swelled   Penicillins Swelling and Rash    Has patient had a PCN reaction causing immediate rash, facial/tongue/throat swelling, SOB or lightheadedness with hypotension: No, delayed Has patient had a PCN reaction causing severe rash involving mucus membranes or skin necrosis: No Has patient had a PCN reaction that required hospitalization No Has patient had a PCN reaction occurring within the last 10 years: No If all of the above answers are "NO", then may proceed with Cephalosporin use.    Bupropion Other (See Comments)    "Made my skin crawl"   Sudafed [Pseudoephedrine Hcl] Rash    Family History  Adopted: Yes  Problem Relation Age of Onset   Hypertension Father    Coronary artery disease Sister    Ulcers Maternal Grandmother    Colon cancer Neg Hx        unknown, was adopted    Prior to Admission medications   Medication Sig Start Date End Date Taking? Authorizing Provider  acetaminophen (TYLENOL) 500 MG tablet Take 500 mg by mouth at bedtime as needed for moderate pain.   Yes [provider]  albuterol (PROVENTIL) (2.5 MG/3ML) 0.083% nebulizer solution Take 3 mLs (2.5 mg total) by nebulization every 4 (four) hours as needed for wheezing or shortness of breath. 11/15/20  Yes Icard, Bradley L, DO  albuterol (VENTOLIN HFA) 108 (90 Base) MCG/ACT inhaler INHALE TWO PUFFS INTO THE LUNGS EVERY SIX HOURS  AS NEEDED FOR WHEEZING OR SHORTNESS OF BREATH 12/15/20  Yes Icard, Leory Plowman L, DO  ALPRAZolam (XANAX) 0.5 MG tablet TAKE 1 TABLET BY MOUTH AT BEDTIME AS NEEDED FOR ANXIETY Patient taking differently: Take 0.5 mg by mouth at bedtime as needed for sleep or anxiety. 01/26/21  Yes [name], NP  aspirin 81 MG chewable tablet Chew 81 mg by mouth every morning.    Yes [provider]  atorvastatin (LIPITOR) 20 MG tablet TAKE ONE TABLET (20MG TOTAL) BY MOUTH DAILY Patient taking differently: Take 20 mg by mouth daily. 01/11/21  Yes Satira Sark, MD  bisoprolol (ZEBETA) 10 MG tablet TAKE ONE (1) TABLET BY MOUTH EVERY DAY 10/19/20  Yes Satira Sark, MD  Budeson-Glycopyrrol-Formoterol (BREZTRI AEROSPHERE) 160-9-4.8 MCG/ACT AERO Inhale 2 puffs into the lungs 2 (two) times daily. 11/15/20  Yes Icard, Octavio Graves, DO  Cholecalciferol (VITAMIN D) 50 MCG (2000 UT) CAPS Take 1 capsule (2,000 Units total) by mouth daily. 03/10/21  Yes Derek Jack, MD  cyanocobalamin (,VITAMIN B-12,) 1000 MCG/ML injection INJECT 1 ML  INTO THE MUSCLE EVERY 30 DAYS. Patient taking differently: Inject 1,000 mcg into the muscle every 30 (thirty) days. 03/10/21  Yes Derek Jack, MD  cyclobenzaprine (FLEXERIL) 5 MG tablet TAKE ONE TABLET BY MOUTH UP TO EVERY 8 HOURS AS NEEDED. START WITH ONE TABLET AT BEDTIME AS NEEDED DUE TO SEDATION. Patient taking differently: Take 5 mg by mouth 3 (three) times daily as needed for muscle spasms. 02/21/21  Yes [name], NP  diclofenac (VOLTAREN) 75 MG EC tablet Take 1 tablet (75 mg total) by mouth 2 (two) times daily. 05/10/21  Yes [name], NP  empagliflozin (JARDIANCE) 10 MG TABS tablet Take 10 mg by mouth daily before breakfast. 12/23/19  Yes Shamleffer, Melanie Crazier, MD  FLUoxetine (PROZAC) 20 MG capsule Take 1 capsule (20 mg total) by mouth daily. 04/26/21  Yes [name], NP  gabapentin (NEURONTIN) 300 MG capsule TAKE ONE CAPSULE (300 MG TOTAL) BY  MOUTHTWO TIMES DAILY. 05/03/21  Yes [name], NP  losartan (COZAAR) 100 MG tablet TAKE ONE (1) TABLET BY MOUTH EVERY DAY 10/04/20  Yes Satira Sark, MD  spironolactone (ALDACTONE) 50 MG tablet Take 1 tablet (50 mg total) by mouth daily. 12/07/20  Yes Nida, Marella Chimes, MD  blood glucose meter kit and supplies Dispense based on patient and insurance preference. Use up to four times daily as directed. (FOR ICD-10 E10.9, E11.9). 11/22/20   [name], NP  Continuous Blood Gluc Receiver (FREESTYLE LIBRE 14 DAY READER) [place] See admin instructions. 09/28/20   [name], NP  Continuous Blood Gluc Receiver (FREESTYLE LIBRE 2 READER) [place] Check blood glucose up to 4 times daily 12/10/20   [name], NP  dicyclomine (BENTYL) 10 MG capsule Take 10 mg by mouth as needed for spasms. Patient not taking: No sig reported    [provider]  glucose blood (ONETOUCH ULTRA) test strip Use as instructed 02/26/19   Ronnald Nian, DO  glucose monitoring kit (FREESTYLE) monitoring kit Use to check blood sugar BID as directed 01/24/19   Cirigliano, Mary K, DO  lactase (LACTAID) 3000 units tablet Take by mouth as needed. Patient not taking: No sig reported    [provider]  Lancets (ONETOUCH DELICA PLUS [id number]) [place] 1 each by Does not apply route 2 (two) times a day. 02/26/19   Cirigliano, Garvin Fila, DO  metFORMIN (GLUCOPHAGE) 1000 MG tablet Take 0.5 tablets (500 mg total) by mouth 2 (two) times daily with a meal. Takes 500 mg twice daily.  Total of 1000 mg daily. Patient not taking: No sig reported 12/15/20   [name], NP  Misc. Devices MISC Please provide supplies (needle, syringe, alcohol swabs) needed for patient to self-administer B-12 injections monthly. 01/07/20   Derek Jack, MD  pantoprazole (PROTONIX) 40 MG tablet TAKE ONE (1) TABLET 30 MINS BEFORE YOUR FIRST MEAL. 09/02/20   Carver, Elon Alas, DO  ULTICARE TUBERCULIN SAFETY SYR 25G X 1" 1 ML MISC USE TO  ADMINISTER INJECTABLE VITAMIN B12. 05/06/21   Harriett Rush, PA-C    Physical Exam: Vitals:   05/17/21 1206 05/17/21 1408 05/17/21 1518 05/17/21 1659  BP: (!) 145/104 (!) 151/84 (!) 173/80 (!) 175/83  Pulse: (!) 104 95 95 (!) 101  Resp: _0 Temp: 98.8 F (37.1 C)     TempSrc: Oral     SpO2: 97% 92% 91% 93%  Weight: 63 kg     Height: 5' 5" (1.651 m)       Constitutional:  NAD, calm, comfortable Eyes: PERRL, lids and conjunctivae normal ENMT: Mucous membranes are moist. Posterior pharynx clear of any exudate or lesions.Normal dentition.  Neck: normal, supple, no masses, no thyromegaly Respiratory: clear to auscultation bilaterally, no wheezing, no crackles. Normal respiratory effort. No accessory muscle use.  Cardiovascular: Regular rate and rhythm, no murmurs / rubs / gallops. No extremity edema. 2+ pedal pulses on left.  Absent pulses on the right  Abdomen: no tenderness, no masses palpated. No hepatosplenomegaly. Bowel sounds positive.  Musculoskeletal: Missing 2 toes on her left foot the right foot is mottled and cool with several toes that are dusky Neurologic: Grossly normal Psychiatric: Normal judgment and insight. Alert and oriented x 3. Normal mood.   Labs on Admission: I have personally reviewed following labs and imaging studies  CBC: Recent Labs  Lab 05/17/21 1351  WBC 16.1*  NEUTROABS 12.5*  HGB 15.3*  HCT 46.6*  MCV 97.3  PLT 882   Basic Metabolic Panel: Recent Labs  Lab 05/17/21 1351  NA 137  K 3.5  CL 103  CO2 26  GLUCOSE 144*  BUN 9  CREATININE 0.56  CALCIUM 9.1   GFR: Estimated Creatinine Clearance (by C-G formula based on SCr of 0.56 mg/dL) Female: 64.8 mL/min Female: 82.2 mL/min  Urine analysis:    Component Value Date/Time   COLORURINE STRAW (A) 01/21/2019 [place] 01/21/2019 1645   LABSPEC 1.029 01/21/2019 1645   PHURINE 6.0 01/21/2019 1645   GLUCOSEU >=500 (A) 01/21/2019 1645   HGBUR NEGATIVE  01/21/2019 1645   BILIRUBINUR negative 06/17/2020 1534   KETONESUR negative 06/17/2020 1534   [place] 01/21/2019 1645   PROTEINUR negative 06/17/2020 1534   PROTEINUR NEGATIVE 01/21/2019 1645   UROBILINOGEN 0.2 06/17/2020 1534   UROBILINOGEN 0.2 11/03/2012 1701   NITRITE Negative 06/17/2020 1534   NITRITE NEGATIVE 01/21/2019 1645   LEUKOCYTESUR Moderate (2+) (A) 06/17/2020 1534   LEUKOCYTESUR NEGATIVE 01/21/2019 1645    Radiological Exams on Admission: [place] Lower Ext Art Right  Result Date: 05/17/2021 CLINICAL DATA:  65 year old female with discoloration and right foot pain EXAM: RIGHT LOWER EXTREMITY ARTERIAL DUPLEX SCAN TECHNIQUE: Gray-scale sonography as well as color Doppler and duplex ultrasound was performed to evaluate the lower extremity arteries including the common, superficial and profunda femoral arteries, popliteal artery and calf arteries. COMPARISON:  None. FINDINGS: Right lower Extremity ABI not acquired Directed duplex right lower extremity demonstrates triphasic common femoral artery, profunda femoris. Triphasic SFA throughout its length. Monophasic popliteal artery, posterior tibial artery, peroneal artery, anterior tibial artery. IMPRESSION: Directed duplex of the right lower extremity demonstrates waveforms maintained in the common femoral artery and length of the SFA. Duplex demonstrates occlusive disease of the distal SFA/popliteal artery, with monophasic waveform of the popliteal artery and tibial arteries. Signed, Dulcy Fanny. Dellia Nims, RPVI [facility] Electronically Signed   By: Corrie Mckusick D.O.   On: 05/17/2021 15:29     Assessment/Plan Principal Problem:   Lower limb ischemia Active Problems:   Acute combined systolic and diastolic heart failure (HCC)   Chronic obstructive pulmonary disease (HCC)   Essential hypertension   Tobacco abuse   Hyperlipidemia   PAD (peripheral artery disease)  (HCC)   Type 2 diabetes mellitus ([place])   Vitamin B12 deficiency  Lower limb ischemia Have consulted vascular surgery who will see the patient when [facility].  Combined systolic diastolic heart failure Appears euvolemic at present  COPD Continue Dulera,  Incruse Ellipta, albuterol as needed  Essential hypertension Continue Cozaar, Aldactone  Hyperlipidemia Continue Lipitor  Peripheral vascular disease Continue Lipitor, Voltaren, gabapentin, aspirin  Type 2 diabetes Continue Zebeta, Jardiance, sliding scale insulin Check hemoglobin A1c  Tobacco abuse  Patient offered and declined nicotine patch  Encourage smoking cessation  Vitamin B deficiency On repletion  Gastric ulcer Continue PPI  Depression/anxiety Continue Prozac, alprazolam as needed  DVT prophylaxis: SCD/Compression stockings Code Status: Full code  Family Communication: Patient at bedside  Disposition Plan: pending depending on treatment  Consults called: Vascular per EDP Admission status: Inpatient patient is inpatient due to ongoing work-up, eval, and possible need for surgical intervention   Donnamae Jude MD [facility]  If 7PM-7AM, please contact night-coverage 05/17/2021, 6:19 PM

## 2021-05-18 ENCOUNTER — Inpatient Hospital Stay (HOSPITAL_COMMUNITY): Payer: PPO

## 2021-05-18 DIAGNOSIS — E119 Type 2 diabetes mellitus without complications: Secondary | ICD-10-CM

## 2021-05-18 DIAGNOSIS — I70261 Atherosclerosis of native arteries of extremities with gangrene, right leg: Secondary | ICD-10-CM

## 2021-05-18 DIAGNOSIS — E7800 Pure hypercholesterolemia, unspecified: Secondary | ICD-10-CM

## 2021-05-18 DIAGNOSIS — E538 Deficiency of other specified B group vitamins: Secondary | ICD-10-CM

## 2021-05-18 DIAGNOSIS — I998 Other disorder of circulatory system: Secondary | ICD-10-CM

## 2021-05-18 DIAGNOSIS — I7092 Chronic total occlusion of artery of the extremities: Secondary | ICD-10-CM

## 2021-05-18 DIAGNOSIS — I5041 Acute combined systolic (congestive) and diastolic (congestive) heart failure: Secondary | ICD-10-CM

## 2021-05-18 DIAGNOSIS — I10 Essential (primary) hypertension: Secondary | ICD-10-CM

## 2021-05-18 DIAGNOSIS — F17210 Nicotine dependence, cigarettes, uncomplicated: Secondary | ICD-10-CM

## 2021-05-18 DIAGNOSIS — I70221 Atherosclerosis of native arteries of extremities with rest pain, right leg: Secondary | ICD-10-CM

## 2021-05-18 DIAGNOSIS — J449 Chronic obstructive pulmonary disease, unspecified: Secondary | ICD-10-CM

## 2021-05-18 DIAGNOSIS — E785 Hyperlipidemia, unspecified: Secondary | ICD-10-CM

## 2021-05-18 LAB — CBC
HCT: 43.7 % (ref 36.0–46.0)
Hemoglobin: 14.4 g/dL (ref 12.0–15.0)
MCH: 31.2 pg (ref 26.0–34.0)
MCHC: 33 g/dL (ref 30.0–36.0)
MCV: 94.8 fL (ref 80.0–100.0)
Platelets: 420 10*3/uL — ABNORMAL HIGH (ref 150–400)
RBC: 4.61 MIL/uL (ref 3.87–5.11)
RDW: 13.6 % (ref 11.5–15.5)
WBC: 20 10*3/uL — ABNORMAL HIGH (ref 4.0–10.5)
nRBC: 0 % (ref 0.0–0.2)

## 2021-05-18 LAB — GLUCOSE, CAPILLARY
Glucose-Capillary: 162 mg/dL — ABNORMAL HIGH (ref 70–99)
Glucose-Capillary: 149 mg/dL — ABNORMAL HIGH (ref 70–99)
Glucose-Capillary: 180 mg/dL — ABNORMAL HIGH (ref 70–99)
Glucose-Capillary: 165 mg/dL — ABNORMAL HIGH (ref 70–99)

## 2021-05-18 LAB — MRSA NEXT GEN BY PCR, NASAL: MRSA by PCR Next Gen: NOT DETECTED

## 2021-05-18 MED ORDER — HYDRALAZINE HCL 20 MG/ML IJ SOLN
10.0000 mg | Freq: Once | INTRAMUSCULAR | Status: AC
  Administered 2021-05-18: 10 mg via INTRAVENOUS
  Filled 2021-05-18: qty 1

## 2021-05-18 MED ORDER — FENTANYL CITRATE (PF) 100 MCG/2ML IJ SOLN
50.0000 ug | INTRAMUSCULAR | Status: DC | PRN
  Administered 2021-05-18 – 2021-05-19 (×2): 50 ug via INTRAVENOUS
  Filled 2021-05-18 (×2): qty 2

## 2021-05-18 NOTE — Progress Notes (Signed)
Right lower extremity saphenous vein mapping has been completed. Preliminary results can be found in CV Proc through chart review.   05/18/21 1:01 PM Tammy Mcpherson RVT

## 2021-05-18 NOTE — Consult Note (Signed)
ASSESSMENT & PLAN   CRITICAL LIMB ISCHEMIA RIGHT LOWER EXTREMITY: This patient has rest pain of the right foot with dry gangrene of the right second toe and evidence of infrainguinal arterial occlusive disease on exam.  I think without revascularization she is at high risk for limb loss.  We have discussed the importance of tobacco cessation.  I have recommended that we proceed with arteriography.  Possibly we could do this on Thursday.  If not, we could do this on Friday.  If we cannot do it until Friday she would like to go home before the procedure.  I have discussed the indications for the procedure and the potential complications and she is agreeable to proceed.  She understands that this is a limb threatening problem.  I have ordered a vein map.  REASON FOR CONSULT:    Ischemic right foot.  The consult is requested by the Hospitalist.  HPI:   Tammy Mcpherson is a 65 y.o. adult who stubbed her right foot on a table approximately 1 month ago.  Since that time she has been having pain in the right foot.  She presented to her primary care physician who sent her to the emergency department for x-ray.  They noted that she had ischemic changes to the right foot and had her transferred to [facility] for vascular consultation.  Prior to this event she denies any history of rest pain and claudication.  However since her injury she has been having claudication in the right calf and rest pain in the right foot.  Risk factors for peripheral vascular disease include type 2 diabetes, hypertension, and hypercholesterolemia.  In addition she smokes half a pack per day of cigarettes.  She is adopted and does not know her family history.  She is on 81 mg of aspirin daily and a statin.  She denies any history of myocardial infarction.  She states that she had congestive heart failure 6 to 7 years ago.  She is had no recent chest pain.  She denies dyspnea on exertion.  Past Medical History:  Diagnosis  Date   Anemia 04/30/2016   Anxiety    Blood transfusion without reported diagnosis    Cardiomyopathy ([place])    a. EF 40-45% by echo in 04/2016 b. Improved to 60-65% by repeat imaging in 2018   CHF (congestive heart failure) (HCC)    COPD (chronic obstructive pulmonary disease) (HCC)    Coronary artery calcification seen on CT scan    Essential hypertension    GERD (gastroesophageal reflux disease)    History of bronchitis    History of GI bleed    Hyperlipidemia    Iron deficiency anemia 05/12/2016   Bleeding ulcers   Leukocytosis 03/23/2020   Pollen allergy    PSVT (paroxysmal supraventricular tachycardia) (HCC)    PVC's (premature ventricular contractions)    Thrombocytosis 03/23/2020   Vitamin B12 deficiency 03/23/2020    Family History  Adopted: Yes  Problem Relation Age of Onset   Hypertension Father    Coronary artery disease Sister    Ulcers Maternal Grandmother    Colon cancer Neg Hx        unknown, was adopted    SOCIAL HISTORY: Social History   Tobacco Use   Smoking status: Light Smoker    Packs/day: 0.25    Years: 44.00    Pack years: 11.00    Types: Cigarettes    Start date: 05/10/1975   Smokeless tobacco: Never   Tobacco  comments:    2 cigarettes per day 12/10/2019  Substance Use Topics   Alcohol use: Not Currently    Alcohol/week: 2.0 standard drinks    Types: 2 Glasses of wine per week    Allergies  Allergen Reactions   Bee Venom Swelling   Codeine Anaphylaxis    Tongue swelled   Penicillins Swelling and Rash    Has patient had a PCN reaction causing immediate rash, facial/tongue/throat swelling, SOB or lightheadedness with hypotension: No, delayed Has patient had a PCN reaction causing severe rash involving mucus membranes or skin necrosis: No Has patient had a PCN reaction that required hospitalization No Has patient had a PCN reaction occurring within the last 10 years: No If all of the above answers are "NO", then may proceed with  Cephalosporin use.    Bupropion Other (See Comments)    "Made my skin crawl"   Sudafed [Pseudoephedrine Hcl] Rash    Current Facility-Administered Medications  Medication Dose Route Frequency Provider Last Rate Last Admin   albuterol (VENTOLIN HFA) 108 (90 Base) MCG/ACT inhaler 2 puff  2 puff Inhalation Q4H PRN Donnamae Jude, MD       ALPRAZolam Duanne Moron) tablet 0.5 mg  0.5 mg Oral QHS PRN Donnamae Jude, MD       aspirin chewable tablet 81 mg  81 mg Oral q morning Donnamae Jude, MD       atorvastatin (LIPITOR) tablet 20 mg  20 mg Oral Daily Donnamae Jude, MD       bisoprolol (ZEBETA) tablet 10 mg  10 mg Oral Daily Donnamae Jude, MD       cyclobenzaprine (FLEXERIL) tablet 5 mg  5 mg Oral TID PRN Donnamae Jude, MD   5 mg at 05/18/21 0032   diclofenac (VOLTAREN) EC tablet 75 mg  75 mg Oral BID Donnamae Jude, MD       empagliflozin (JARDIANCE) tablet 10 mg  10 mg Oral QAC breakfast Donnamae Jude, MD       fentaNYL (SUBLIMAZE) injection 50 mcg  50 mcg Intravenous Q2H PRN Donnamae Jude, MD   50 mcg at 05/18/21 0032   FLUoxetine (PROZAC) capsule 20 mg  20 mg Oral Daily Donnamae Jude, MD       gabapentin (NEURONTIN) capsule 300 mg  300 mg Oral BID Donnamae Jude, MD   300 mg at 05/17/21 2252   insulin aspart (novoLOG) injection 0-15 Units  0-15 Units Subcutaneous TID WC Donnamae Jude, MD       lactated ringers infusion   Intravenous Continuous Donnamae Jude, MD 100 mL/hr at 05/18/21 0039 New Bag at 05/18/21 0039   losartan (COZAAR) tablet 100 mg  100 mg Oral Daily Triplett, Tammy, PA-C       metoprolol tartrate (LOPRESSOR) injection 5 mg  5 mg Intravenous Q6H PRN Donnamae Jude, MD   5 mg at 05/17/21 2254   mometasone-formoterol (DULERA) 100-5 MCG/ACT inhaler 2 puff  2 puff Inhalation BID Triplett, Tammy, PA-C       ondansetron (ZOFRAN) tablet 4 mg  4 mg Oral Q6H PRN Donnamae Jude, MD       Or   ondansetron [facility]) injection 4 mg  4 mg Intravenous Q6H PRN Donnamae Jude, MD        pantoprazole (PROTONIX) EC tablet 40 mg  40 mg Oral Daily Donnamae Jude, MD       polyethylene glycol (MIRALAX / GLYCOLAX) packet  17 g  17 g Oral Daily PRN Donnamae Jude, MD       spironolactone (ALDACTONE) tablet 50 mg  50 mg Oral Daily Triplett, Tammy, PA-C       umeclidinium bromide (INCRUSE ELLIPTA) 62.5 MCG/INH 1 puff  1 puff Inhalation Daily Triplett, Tammy, PA-C        REVIEW OF SYSTEMS:  '[X]'$  denotes positive finding, '[ ]'$  denotes negative finding Cardiac  Comments:  Chest pain or chest pressure:    Shortness of breath upon exertion:    Short of breath when lying flat:    Irregular heart rhythm:        Vascular    Pain in calf, thigh, or hip brought on by ambulation: x   Pain in feet at night that wakes you up from your sleep:  x   Blood clot in your veins:    Leg swelling:         Pulmonary    Oxygen at home:    Productive cough:     Wheezing:         Neurologic    Sudden weakness in arms or legs:     Sudden numbness in arms or legs:     Sudden onset of difficulty speaking or slurred speech:    Temporary loss of vision in one eye:     Problems with dizziness:         Gastrointestinal    Blood in stool:     Vomited blood:         Genitourinary    Burning when urinating:     Blood in urine:        Psychiatric    Major depression:         Hematologic    Bleeding problems:    Problems with blood clotting too easily:        Skin    Rashes or ulcers:        Constitutional    Fever or chills:    -  PHYSICAL EXAM:   Vitals:   05/17/21 2132 05/17/21 2251 05/18/21 0012 05/18/21 0111  BP: (!) 201/82 (!) 199/92 (!) 190/85 (!) 224/97  Pulse: (!) 105 (!) 101 93 97  Resp: '19 16 20 '$ (!) 25  Temp:   98.1 F (36.7 C)   TempSrc:   Oral   SpO2: 93% 96% 92% 95%  Weight:      Height:       Body mass index is 23.13 kg/m. GENERAL: The patient is a well-nourished adult, in no acute distress. The vital signs are documented above. CARDIAC: There is a regular rate  and rhythm.  VASCULAR: I do not detect carotid bruits. On the right side, which is the side of concern, she has a palpable femoral pulse.  I cannot palpate popliteal or pedal pulses.  She has a monophasic posterior tibial signal and a monophasic anterior tibial signal on the right with a Doppler. On the left side she has a palpable femoral, popliteal, and dorsalis pedis pulse.  She has a biphasic posterior tibial signal on the left with the Doppler. PULMONARY: There is good air exchange bilaterally without wheezing or rales. ABDOMEN: Soft and non-tender with normal pitched bowel sounds.  I do not palpate an abdominal aortic aneurysm. MUSCULOSKELETAL: There are no major deformities. NEUROLOGIC: No focal weakness or paresthesias are detected. SKIN: She has ischemic changes to the right foot.       PSYCHIATRIC: The patient has a normal  affect.  DATA:    ARTERIAL DUPLEX: I reviewed her arterial duplex scan that was done yesterday.  The patient was noted to have triphasic Doppler signals in the common femoral artery and deep femoral artery.  There was also triphasic flow in the superficial femoral artery.  There was monophasic flow in the popliteal artery, posterior tibial artery, peroneal artery, and anterior tibial artery.  LABS: Reviewed her labs from yesterday.  Potassium 3.5.  Creatinine 0.56.  GFR greater than 60.  Her Mccullers blood cell count of 16.1.  Hemoglobin 15.3.  Hematocrit 46.6.  Platelets 381,000.  Her hemoglobin A1c is 7.2.    [name] [facility]

## 2021-05-18 NOTE — Progress Notes (Signed)
   05/18/21 0111  Assess: MEWS Score  Temp 98.1 F (36.7 C)  BP (!) 224/97  Pulse Rate 97  ECG Heart Rate 99  Resp (!) 25  Level of Consciousness Alert  SpO2 95 %  O2 Device Nasal Cannula  O2 Flow Rate (L/min) 2 L/min  Assess: MEWS Score  MEWS Temp 0  MEWS Systolic 2  MEWS Pulse 0  MEWS RR 1  MEWS LOC 0  MEWS Score 3  MEWS Score Color Yellow  Assess: if the MEWS score is Yellow or Red  Were vital signs taken at a resting state? Yes  Focused Assessment No change from prior assessment  Early Detection of Sepsis Score *See Row Information* Low  MEWS guidelines implemented *See Row Information* Yes  Treat  MEWS Interventions Administered prn meds/treatments  Take Vital Signs  Increase Vital Sign Frequency  Yellow: Q 2hr X 2 then Q 4hr X 2, if remains yellow, continue Q 4hrs  Escalate  MEWS: Escalate Yellow: discuss with charge nurse/RN and consider discussing with provider and RRT  Notify: Charge Nurse/RN  Name of Charge Nurse/RN Notified Gladys, RN  Date Charge Nurse/RN Notified 05/18/21  Time Charge Nurse/RN Notified 0119  Document  Patient Outcome Stabilized after interventions  Progress note created (see row info) Yes

## 2021-05-18 NOTE — Progress Notes (Signed)
PROGRESS NOTE    Tammy Mcpherson  [id number] DOB: 15-Sep-1956 DOA: 05/17/2021 PCP: [name], NP   Brief Narrative: Tammy Mcpherson is a 65 y.o. female with history of type 2 diabetes mellitus, hypertension, hyperlipidemia, PAD, tobacco use.  Patient presented secondary to worsening right foot pain with evidence of critical limb ischemia on admission.  Vascular surgery was consulted and plan to perform arteriography.   Assessment & Plan:   Principal Problem:   Lower limb ischemia Active Problems:   Acute combined systolic and diastolic heart failure (HCC)   Chronic obstructive pulmonary disease (HCC)   Essential hypertension   Tobacco abuse   Gastric ulcer   Hyperlipidemia   PAD (peripheral artery disease) (HCC)   Type 2 diabetes mellitus (HCC)   Vitamin B12 deficiency   Critical limb ischemia PAD Right lower extremity. Associated rest pain. Vascular surgery consulted with plan for arteriography on 8/18. Patient is on aspirin as an outpatient. -Continue aspirin  Combined systolic and diastolic heart failure Stable.patient is on bisoprolol, losartan, spironolactone as an outpatient -Continue bisoprolol, losartan, spironolactone  COPD Patient is on albuterol, Breztri as an outpatient. Stable. -Continue Dulera/Incruse Ellipta and albuterol while inpatient  Primary hypertension Patient is on bisoprolol, losartan, spironolactone as an outpatient. -Continue losartan, spironolactone, bisoprolol  Diabetes mellitus, type 2 Patient is on metformin, Jardiance as an outpatient.  Metformin held on admission. -Continue Jardiance -Resume home metformin at least 48 hours status post vascular intervention  Tobacco use Counseled on admission.  Nicotine patch offered and declined by patient.  Vitamin B12 deficiency Patient receives vitamin B12 injections every 30 days  Gastric ulcer -Continue Protonix  Depression Anxiety Patient is on Xanax and Prozac as an  outpatient -Continue Xanax and Prozac  DVT prophylaxis: SCDs Code Status:   Code Status: Full Code Family Communication: Husband at bedside Disposition Plan: Discharge home likely in 2 to 3 days pending vascular surgery recommendations/management   Consultants:  Vascular surgery  Procedures:  None  Antimicrobials: None   Subjective: No significant issues overnight.  Eager to go home.  She has some pain of her right foot/ankle.  She does not have any pain of her right leg.  Objective: Vitals:   05/18/21 0905 05/18/21 1141 05/18/21 1618 05/18/21 2000  BP:  140/68 (!) 153/82 133/78  Pulse: (!) 126 96 96 82  Resp: 18 (!) 21 (!) 25 19  Temp:  98.7 F (37.1 C) 98.6 F (37 C) 98.5 F (36.9 C)  TempSrc:  Oral Oral Oral  SpO2: 91%  93% 95%  Weight:      Height:        Intake/Output Summary (Last 24 hours) at 05/18/2021 2102 Last data filed at 05/18/2021 0600 Gross per 24 hour  Intake 260 ml  Output --  Net 260 ml   Filed Weights   05/17/21 1206  Weight: 63 kg    Examination:  General exam: Appears calm and comfortable Respiratory system: Clear to auscultation. Respiratory effort normal. Cardiovascular system: S1 & S2 heard, RRR. No murmurs, rubs, gallops or clicks. Gastrointestinal system: Abdomen is nondistended, soft and nontender. No organomegaly or masses felt. Normal bowel sounds heard. Central nervous system: Alert and oriented. No focal neurological deficits. Musculoskeletal: No edema. No calf tenderness Skin: Cyanosis of certain aspects of right foot with significant tenderness on palpation. No rashes Psychiatry: Judgement and insight appear normal. Mood & affect appropriate.     Data Reviewed: I have personally reviewed following labs and imaging studies  CBC Lab Results  Component Value Date   WBC 20.0 (H) 05/18/2021   RBC 4.61 05/18/2021   HGB 14.4 05/18/2021   HCT 43.7 05/18/2021   MCV 94.8 05/18/2021   MCH 31.2 05/18/2021   PLT 420 (H)  05/18/2021   MCHC 33.0 05/18/2021   RDW 13.6 05/18/2021   LYMPHSABS 1.9 05/17/2021   MONOABS 1.4 (H) 05/17/2021   EOSABS 0.1 05/17/2021   BASOSABS 0.1 [id number]     Last metabolic panel Lab Results  Component Value Date   NA 137 05/17/2021   K 3.5 05/17/2021   CL 103 05/17/2021   CO2 26 05/17/2021   BUN 9 05/17/2021   CREATININE 0.56 05/17/2021   GLUCOSE 144 (H) 05/17/2021   GFRNONAA >60 05/17/2021   GFRAA 115 09/01/2020   CALCIUM 9.1 05/17/2021   PROT 6.9 04/26/2021   ALBUMIN 4.0 04/26/2021   LABGLOB 2.9 09/01/2020   AGRATIO 1.4 09/01/2020   BILITOT 0.4 04/26/2021   ALKPHOS 101 04/26/2021   AST 12 04/26/2021   ALT 14 04/26/2021   ANIONGAP 8 05/17/2021    CBG (last 3)  Recent Labs    05/18/21 0758 05/18/21 1140 05/18/21 1617  GLUCAP 162* 149* 180*     GFR: Estimated Creatinine Clearance (by C-G formula based on SCr of 0.56 mg/dL) Female: 64.8 mL/min Female: 82.2 mL/min  Coagulation Profile: No results for input(s): INR, PROTIME in the last 168 hours.  Recent Results (from the past 240 hour(s))  Resp Panel by RT-PCR (Flu A&B, Covid) Nasopharyngeal Swab     Status: None   Collection Time: 05/17/21  5:00 PM   Specimen: Nasopharyngeal Swab; Nasopharyngeal(NP) swabs in vial transport medium  Result Value Ref Range Status   SARS Coronavirus 2 by RT PCR NEGATIVE NEGATIVE Final    Comment: (NOTE) SARS-CoV-2 target nucleic acids are NOT DETECTED.  The SARS-CoV-2 RNA is generally detectable in upper respiratory specimens during the acute phase of infection. The lowest concentration of SARS-CoV-2 viral copies this assay can detect is 138 copies/mL. A negative result does not preclude SARS-Cov-2 infection and should not be used as the sole basis for treatment or other patient management decisions. A negative result may occur with  improper specimen collection/handling, submission of specimen other than nasopharyngeal swab, presence of viral mutation(s) within  the areas targeted by this assay, and inadequate number of viral copies(<138 copies/mL). A negative result must be combined with clinical observations, patient history, and epidemiological information. The expected result is Negative.  Fact Sheet for Patients:  EntrepreneurPulse.com.au  Fact Sheet for Healthcare Providers:  IncredibleEmployment.be  This test is no t yet approved or cleared by the [place] FDA and  has been authorized for detection and/or diagnosis of SARS-CoV-2 by FDA under an Emergency Use Authorization (EUA). This EUA will remain  in effect (meaning this test can be used) for the duration of the COVID-19 declaration under Section 564(b)(1) of the Act, 21 U.S.C.section 360bbb-3(b)(1), unless the authorization is terminated  or revoked sooner.       Influenza A by PCR NEGATIVE NEGATIVE Final   Influenza B by PCR NEGATIVE NEGATIVE Final    Comment: (NOTE) The Xpert Xpress SARS-CoV-2/FLU/RSV plus assay is intended as an aid in the diagnosis of influenza from Nasopharyngeal swab specimens and should not be used as a sole basis for treatment. Nasal washings and aspirates are unacceptable for Xpert Xpress SARS-CoV-2/FLU/RSV testing.  Fact Sheet for Patients: EntrepreneurPulse.com.au  Fact Sheet for Healthcare Providers: IncredibleEmployment.be  This test is  not yet approved or cleared by the [place] and has been authorized for detection and/or diagnosis of SARS-CoV-2 by FDA under an Emergency Use Authorization (EUA). This EUA will remain in effect (meaning this test can be used) for the duration of the COVID-19 declaration under Section 564(b)(1) of the Act, 21 U.S.C. section 360bbb-3(b)(1), unless the authorization is terminated or revoked.  Performed at [facility], [address] [place], [place] [address]   MRSA Next Gen by PCR, Nasal     Status: None   Collection  Time: 05/18/21 12:47 AM   Specimen: Nasal Mucosa; Nasal Swab  Result Value Ref Range Status   MRSA by PCR Next Gen NOT DETECTED NOT DETECTED Final    Comment: (NOTE) The GeneXpert MRSA Assay (FDA approved for NASAL specimens only), is one component of a comprehensive MRSA colonization surveillance program. It is not intended to diagnose MRSA infection nor to guide or monitor treatment for MRSA infections. Test performance is not FDA approved in patients less than 40 years old. Performed at [facility], [place] [address], [place], [place] [address]         Radiology Studies: [place] Lower Ext Art Right  Result Date: 05/17/2021 CLINICAL DATA:  65 year old female with discoloration and right foot pain EXAM: RIGHT LOWER EXTREMITY ARTERIAL DUPLEX SCAN TECHNIQUE: Gray-scale sonography as well as color Doppler and duplex ultrasound was performed to evaluate the lower extremity arteries including the common, superficial and profunda femoral arteries, popliteal artery and calf arteries. COMPARISON:  None. FINDINGS: Right lower Extremity ABI not acquired Directed duplex right lower extremity demonstrates triphasic common femoral artery, profunda femoris. Triphasic SFA throughout its length. Monophasic popliteal artery, posterior tibial artery, peroneal artery, anterior tibial artery. IMPRESSION: Directed duplex of the right lower extremity demonstrates waveforms maintained in the common femoral artery and length of the SFA. Duplex demonstrates occlusive disease of the distal SFA/popliteal artery, with monophasic waveform of the popliteal artery and tibial arteries. Signed, Dulcy Fanny. Dellia Nims, RPVI [facility] Radiology Electronically Signed   By: Corrie Mckusick D.O.   On: 05/17/2021 15:29   VAS [place] LOWER EXTREMITY SAPHENOUS VEIN MAPPING  Result Date: 05/18/2021 LOWER EXTREMITY VEIN MAPPING Patient Name:  ILYSSA MOLESKI Sealey  Date of Exam:   05/18/2021 Medical  Rec #: [id number]      Accession #:    [id number] Date of Birth: 03-17-1956     Patient Gender: F Patient Age:   62 years Exam Location:  [facility] Procedure:      VAS [place] LOWER EXTREMITY SAPHENOUS VEIN MAPPING Referring Phys: Harrell Gave DICKSON --------------------------------------------------------------------------------  Indications:  Ischemic limb Risk Factors: Hypertension, hyperlipidemia, Diabetes, PAD.  Comparison Study: No prior studies. Performing Technologist: Oliver Hum RVT  Examination Guidelines: A complete evaluation includes B-mode imaging, spectral Doppler, color Doppler, and power Doppler as needed of all accessible portions of each vessel. Bilateral testing is considered an integral part of a complete examination. Limited examinations for reoccurring indications may be performed as noted. +---------------+-----------+----------------------+---------------+-----------+   RT Diameter  RT Findings         GSV            LT Diameter  LT Findings      (cm)                                            (  cm)                  +---------------+-----------+----------------------+---------------+-----------+      0.47                     Saphenofemoral                                                                Junction                                  +---------------+-----------+----------------------+---------------+-----------+      0.35       branching     Proximal thigh                               +---------------+-----------+----------------------+---------------+-----------+      0.30       branching       Mid thigh                                  +---------------+-----------+----------------------+---------------+-----------+      0.30                      Distal thigh                                +---------------+-----------+----------------------+---------------+-----------+      0.28                          Knee                                     +---------------+-----------+----------------------+---------------+-----------+      0.11       branching       Prox calf                                  +---------------+-----------+----------------------+---------------+-----------+      0.11                        Mid calf                                  +---------------+-----------+----------------------+---------------+-----------+      0.12                      Distal calf                                 +---------------+-----------+----------------------+---------------+-----------+    Preliminary         Scheduled Meds:  aspirin  81 mg Oral q morning   atorvastatin  20 mg Oral Daily   bisoprolol  10 mg Oral Daily  diclofenac  75 mg Oral BID   empagliflozin  10 mg Oral QAC breakfast   FLUoxetine  20 mg Oral Daily   gabapentin  300 mg Oral BID   insulin aspart  0-15 Units Subcutaneous TID WC   losartan  100 mg Oral Daily   mometasone-formoterol  2 puff Inhalation BID   pantoprazole  40 mg Oral Daily   spironolactone  50 mg Oral Daily   umeclidinium bromide  1 puff Inhalation Daily   Continuous Infusions:  lactated ringers 10 mL/hr at 05/18/21 0116     LOS: 1 day     Cordelia Poche, MD [facility] 05/18/2021, 9:02 PM  If 7PM-7AM, please contact night-coverage [email or web address]

## 2021-05-18 NOTE — Progress Notes (Addendum)
VASCULAR SURGERY:  Her arteriogram is scheduled for Thursday.  I have discussed the procedure again with the patient today.  I have written her preop orders.  Gae Gallop, MD 8:15 AM

## 2021-05-19 ENCOUNTER — Encounter (HOSPITAL_COMMUNITY)
Admission: EM | Disposition: A | Discharge status: Home / Self Care | Payer: Self-pay | Source: Home / Self Care | Attending: Family Medicine

## 2021-05-19 DIAGNOSIS — I70261 Atherosclerosis of native arteries of extremities with gangrene, right leg: Secondary | ICD-10-CM

## 2021-05-19 HISTORY — PX: ABDOMINAL AORTOGRAM W/LOWER EXTREMITY: CATH118223

## 2021-05-19 HISTORY — PX: PERIPHERAL VASCULAR INTERVENTION: CATH118257

## 2021-05-19 LAB — CBC
HCT: 42.6 % (ref 36.0–46.0)
Hemoglobin: 14.3 g/dL (ref 12.0–15.0)
MCH: 32.1 pg (ref 26.0–34.0)
MCHC: 33.6 g/dL (ref 30.0–36.0)
MCV: 95.5 fL (ref 80.0–100.0)
Platelets: 340 10*3/uL (ref 150–400)
RBC: 4.46 MIL/uL (ref 3.87–5.11)
RDW: 13.5 % (ref 11.5–15.5)
WBC: 13.9 10*3/uL — ABNORMAL HIGH (ref 4.0–10.5)
nRBC: 0 % (ref 0.0–0.2)

## 2021-05-19 LAB — GLUCOSE, CAPILLARY
Glucose-Capillary: 119 mg/dL — ABNORMAL HIGH (ref 70–99)
Glucose-Capillary: 143 mg/dL — ABNORMAL HIGH (ref 70–99)

## 2021-05-19 LAB — CREATININE, SERUM
Creatinine, Ser: 0.54 mg/dL (ref 0.44–1.00)
GFR, Estimated: >60 mL/min (ref >60)

## 2021-05-19 SURGERY — ABDOMINAL AORTOGRAM W/LOWER EXTREMITY
Anesthesia: LOCAL | Laterality: Right

## 2021-05-19 MED ORDER — CLOPIDOGREL BISULFATE 75 MG PO TABS: 300.0000 mg | ORAL_TABLET | Freq: Once | ORAL | Status: AC

## 2021-05-19 MED ORDER — HEPARIN SODIUM (PORCINE) 1000 UNIT/ML IJ SOLN
INTRAMUSCULAR | Status: DC | PRN
  Administered 2021-05-19: 7000 [IU] via INTRAVENOUS

## 2021-05-19 MED ORDER — CLOPIDOGREL BISULFATE 300 MG PO TABS
ORAL_TABLET | ORAL | Status: AC
  Filled 2021-05-19: qty 1

## 2021-05-19 MED ORDER — SODIUM CHLORIDE 0.9 % IV SOLN: INTRAVENOUS | Status: DC

## 2021-05-19 MED ORDER — FENTANYL CITRATE (PF) 100 MCG/2ML IJ SOLN
INTRAMUSCULAR | Status: AC
  Filled 2021-05-19: qty 2

## 2021-05-19 MED ORDER — MIDAZOLAM HCL 2 MG/2ML IJ SOLN
INTRAMUSCULAR | Status: DC | PRN
  Administered 2021-05-19: 1 mg via INTRAVENOUS

## 2021-05-19 MED ORDER — FENTANYL CITRATE (PF) 100 MCG/2ML IJ SOLN
INTRAMUSCULAR | Status: DC | PRN
  Administered 2021-05-19: 50 ug via INTRAVENOUS

## 2021-05-19 MED ORDER — LABETALOL HCL 5 MG/ML IV SOLN
INTRAVENOUS | Status: AC
  Filled 2021-05-19: qty 4

## 2021-05-19 MED ORDER — CLOPIDOGREL BISULFATE 300 MG PO TABS
ORAL_TABLET | ORAL | Status: DC | PRN
  Administered 2021-05-19: 300 mg via ORAL

## 2021-05-19 MED ORDER — LIDOCAINE HCL (PF) 1 % IJ SOLN
INTRAMUSCULAR | Status: AC
  Filled 2021-05-19: qty 30

## 2021-05-19 MED ORDER — HEPARIN (PORCINE) IN NACL 1000-0.9 UT/500ML-% IV SOLN
INTRAVENOUS | Status: AC
  Filled 2021-05-19: qty 500

## 2021-05-19 MED ORDER — HEPARIN SODIUM (PORCINE) 1000 UNIT/ML IJ SOLN
INTRAMUSCULAR | Status: AC
  Filled 2021-05-19: qty 1

## 2021-05-19 MED ORDER — ACETAMINOPHEN 325 MG PO TABS
650.0000 mg | ORAL_TABLET | ORAL | Status: DC | PRN
  Administered 2021-05-20: 650 mg via ORAL
  Filled 2021-05-19: qty 2

## 2021-05-19 MED ORDER — SODIUM CHLORIDE 0.9% FLUSH: 3.0000 mL | INTRAVENOUS | Status: DC | PRN

## 2021-05-19 MED ORDER — SODIUM CHLORIDE 0.9 % IV SOLN
INTRAVENOUS | Status: AC | PRN
  Administered 2021-05-19: 5 mL/h via INTRAVENOUS

## 2021-05-19 MED ORDER — SODIUM CHLORIDE 0.9% FLUSH
3.0000 mL | Freq: Two times a day (BID) | INTRAVENOUS | Status: DC
  Administered 2021-05-20: 3 mL via INTRAVENOUS

## 2021-05-19 MED ORDER — HEPARIN (PORCINE) IN NACL 1000-0.9 UT/500ML-% IV SOLN
INTRAVENOUS | Status: DC | PRN
  Administered 2021-05-19 (×2): 500 mL

## 2021-05-19 MED ORDER — HEPARIN SODIUM (PORCINE) 5000 UNIT/ML IJ SOLN
5000.0000 [IU] | Freq: Three times a day (TID) | INTRAMUSCULAR | Status: DC
  Administered 2021-05-19 – 2021-05-20 (×2): 5000 [IU] via SUBCUTANEOUS
  Filled 2021-05-19 (×2): qty 1

## 2021-05-19 MED ORDER — IODIXANOL 320 MG/ML IV SOLN
INTRAVENOUS | Status: DC | PRN
  Administered 2021-05-19: 150 mL via INTRA_ARTERIAL

## 2021-05-19 MED ORDER — LIDOCAINE HCL (PF) 1 % IJ SOLN
INTRAMUSCULAR | Status: DC | PRN
  Administered 2021-05-19: 15 mL via INTRADERMAL

## 2021-05-19 MED ORDER — LABETALOL HCL 5 MG/ML IV SOLN: 10.0000 mg | INTRAVENOUS | Status: DC | PRN

## 2021-05-19 MED ORDER — HYDRALAZINE HCL 20 MG/ML IJ SOLN
5.0000 mg | INTRAMUSCULAR | Status: DC | PRN
Start: 2021-05-19 — End: 2021-05-20

## 2021-05-19 MED ORDER — CLOPIDOGREL BISULFATE 75 MG PO TABS
75.0000 mg | ORAL_TABLET | Freq: Every day | ORAL | Status: DC
  Administered 2021-05-20: 75 mg via ORAL
  Filled 2021-05-19: qty 1

## 2021-05-19 MED ORDER — MIDAZOLAM HCL 2 MG/2ML IJ SOLN
INTRAMUSCULAR | Status: AC
  Filled 2021-05-19: qty 2

## 2021-05-19 MED ORDER — SODIUM CHLORIDE 0.9 % WEIGHT BASED INFUSION: 1.0000 mL/kg/h | INTRAVENOUS | Status: AC

## 2021-05-19 MED ORDER — SODIUM CHLORIDE 0.9 % IV SOLN: 250.0000 mL | INTRAVENOUS | Status: DC | PRN

## 2021-05-19 MED ORDER — LABETALOL HCL 5 MG/ML IV SOLN
INTRAVENOUS | Status: DC | PRN
  Administered 2021-05-19: 10 mg via INTRAVENOUS

## 2021-05-19 SURGICAL SUPPLY — 22 items
BALLN MUSTANG 4X100X135 (BALLOONS) ×3
BALLN STERLING OTW 3X60X150 (BALLOONS) ×3
BALLOON MUSTANG 4X100X135 (BALLOONS) IMPLANT
BALLOON STERLING OTW 3X60X150 (BALLOONS) IMPLANT
CATH CXI SUPP 2.6F 150 ST (CATHETERS) ×1 IMPLANT
CATH OMNI FLUSH 5F 65CM (CATHETERS) ×1 IMPLANT
CATH QUICKCROSS .035X135CM (MICROCATHETER) ×1 IMPLANT
CLOSURE PERCLOSE PROSTYLE (VASCULAR PRODUCTS) ×2 IMPLANT
COVER DOME SNAP 22 D (MISCELLANEOUS) ×1 IMPLANT
GLIDEWIRE ADV .035X260CM (WIRE) ×1 IMPLANT
KIT ENCORE 26 ADVANTAGE (KITS) ×2 IMPLANT
KIT MICROPUNCTURE NIT STIFF (SHEATH) ×1 IMPLANT
KIT PV (KITS) ×3 IMPLANT
SHEATH PINNACLE 5F 10CM (SHEATH) ×1 IMPLANT
SHEATH PINNACLE MP 6F 45CM (SHEATH) ×2 IMPLANT
SHEATH PROBE COVER 6X72 (BAG) ×2 IMPLANT
STENT INNOVA 5X120X130 (Permanent Stent) ×2 IMPLANT
SYR MEDRAD MARK 7 150ML (SYRINGE) ×3 IMPLANT
TRANSDUCER W/STOPCOCK (MISCELLANEOUS) ×3 IMPLANT
TRAY PV CATH (CUSTOM PROCEDURE TRAY) ×3 IMPLANT
WIRE BENTSON .035X145CM (WIRE) ×1 IMPLANT
WIRE G V18X300CM (WIRE) ×1 IMPLANT

## 2021-05-19 NOTE — Progress Notes (Signed)
PROGRESS NOTE    Tammy Mcpherson  [id number] DOB: 1956-09-18 DOA: 05/17/2021 PCP: [name], NP   Brief Narrative: Tammy Mcpherson is a 65 y.o. female with history of type 2 diabetes mellitus, hypertension, hyperlipidemia, PAD, tobacco use.  Patient presented secondary to worsening right foot pain with evidence of critical limb ischemia on admission.  Vascular surgery was consulted and plan to perform arteriography.   Assessment & Plan:   Principal Problem:   Lower limb ischemia Active Problems:   Acute combined systolic and diastolic heart failure (HCC)   Chronic obstructive pulmonary disease (HCC)   Essential hypertension   Tobacco abuse   Gastric ulcer   Hyperlipidemia   PAD (peripheral artery disease) (HCC)   Type 2 diabetes mellitus (HCC)   Vitamin B12 deficiency   Critical limb ischemia PAD Right lower extremity. Associated rest pain. Vascular surgery consulted with plan for arteriography on 8/18. Patient is on aspirin as an outpatient. -Continue aspirin  Combined systolic and diastolic heart failure Stable.patient is on bisoprolol, losartan, spironolactone as an outpatient -Continue bisoprolol, losartan, spironolactone  COPD Patient is on albuterol, Breztri as an outpatient. Stable. -Continue Dulera/Incruse Ellipta and albuterol while inpatient  Primary hypertension Patient is on bisoprolol, losartan, spironolactone as an outpatient. -Continue losartan, spironolactone, bisoprolol  Diabetes mellitus, type 2 Patient is on metformin, Jardiance as an outpatient.  Metformin held on admission. -Continue Jardiance -Resume home metformin at earliest 48 hours status post vascular intervention  Tobacco use Counseled on admission.  Nicotine patch offered and declined by patient.  Vitamin B12 deficiency Patient receives vitamin B12 injections every 30 days  Gastric ulcer -Continue Protonix  Depression Anxiety Patient is on Xanax and Prozac as an  outpatient -Continue Xanax and Prozac  DVT prophylaxis: SCDs Code Status:   Code Status: Full Code Family Communication: None at bedside Disposition Plan: Discharge home likely in 2 to 3 days pending vascular surgery recommendations/management   Consultants:  Vascular surgery  Procedures:  None  Antimicrobials: None   Subjective: Some foot pain overnight. No other issues.  Objective: Vitals:   05/18/21 1618 05/18/21 2000 05/18/21 2343 05/19/21 0440  BP: (!) 153/82 133/78 (!) 172/77 (!) 159/91  Pulse: 96 82 93 100  Resp: (!) '25 19 20 20  '$ Temp: 98.6 F (37 C) 98.5 F (36.9 C) 97.8 F (36.6 C) 98 F (36.7 C)  TempSrc: Oral Oral Oral Oral  SpO2: 93% 95% 98% 97%  Weight:      Height:       No intake or output data in the 24 hours ending 05/19/21 0833  Filed Weights   05/17/21 1206  Weight: 63 kg    Examination:  General exam: Appears calm and comfortable Respiratory system: Clear to auscultation. Respiratory effort normal. Cardiovascular system: S1 & S2 heard, RRR. No murmurs, rubs, gallops or clicks. Gastrointestinal system: Abdomen is nondistended, soft and nontender. No organomegaly or masses felt. Normal bowel sounds heard. Central nervous system: Alert and oriented. No focal neurological deficits. Musculoskeletal: No edema. No calf tenderness Skin: Right foot with areas of cyanosis. Psychiatry: Judgement and insight appear normal. Mood & affect appropriate.     Data Reviewed: I have personally reviewed following labs and imaging studies  CBC Lab Results  Component Value Date   WBC 13.9 (H) 05/19/2021   RBC 4.46 05/19/2021   HGB 14.3 05/19/2021   HCT 42.6 05/19/2021   MCV 95.5 05/19/2021   MCH 32.1 05/19/2021   PLT 340 05/19/2021   MCHC  33.6 05/19/2021   RDW 13.5 05/19/2021   LYMPHSABS 1.9 05/17/2021   MONOABS 1.4 (H) 05/17/2021   EOSABS 0.1 05/17/2021   BASOSABS 0.1 [id number]     Last metabolic panel Lab Results  Component Value Date    NA 137 05/17/2021   K 3.5 05/17/2021   CL 103 05/17/2021   CO2 26 05/17/2021   BUN 9 05/17/2021   CREATININE 0.56 05/17/2021   GLUCOSE 144 (H) 05/17/2021   GFRNONAA >60 05/17/2021   GFRAA 115 09/01/2020   CALCIUM 9.1 05/17/2021   PROT 6.9 04/26/2021   ALBUMIN 4.0 04/26/2021   LABGLOB 2.9 09/01/2020   AGRATIO 1.4 09/01/2020   BILITOT 0.4 04/26/2021   ALKPHOS 101 04/26/2021   AST 12 04/26/2021   ALT 14 04/26/2021   ANIONGAP 8 05/17/2021    CBG (last 3)  Recent Labs    05/18/21 1140 05/18/21 1617 05/18/21 2158  GLUCAP 149* 180* 165*      GFR: Estimated Creatinine Clearance (by C-G formula based on SCr of 0.56 mg/dL) Female: 64.8 mL/min Female: 82.2 mL/min  Coagulation Profile: No results for input(s): INR, PROTIME in the last 168 hours.  Recent Results (from the past 240 hour(s))  Resp Panel by RT-PCR (Flu A&B, Covid) Nasopharyngeal Swab     Status: None   Collection Time: 05/17/21  5:00 PM   Specimen: Nasopharyngeal Swab; Nasopharyngeal(NP) swabs in vial transport medium  Result Value Ref Range Status   SARS Coronavirus 2 by RT PCR NEGATIVE NEGATIVE Final    Comment: (NOTE) SARS-CoV-2 target nucleic acids are NOT DETECTED.  The SARS-CoV-2 RNA is generally detectable in upper respiratory specimens during the acute phase of infection. The lowest concentration of SARS-CoV-2 viral copies this assay can detect is 138 copies/mL. A negative result does not preclude SARS-Cov-2 infection and should not be used as the sole basis for treatment or other patient management decisions. A negative result may occur with  improper specimen collection/handling, submission of specimen other than nasopharyngeal swab, presence of viral mutation(s) within the areas targeted by this assay, and inadequate number of viral copies(<138 copies/mL). A negative result must be combined with clinical observations, patient history, and epidemiological information. The expected result is  Negative.  Fact Sheet for Patients:  EntrepreneurPulse.com.au  Fact Sheet for Healthcare Providers:  IncredibleEmployment.be  This test is no t yet approved or cleared by the [place] FDA and  has been authorized for detection and/or diagnosis of SARS-CoV-2 by FDA under an Emergency Use Authorization (EUA). This EUA will remain  in effect (meaning this test can be used) for the duration of the COVID-19 declaration under Section 564(b)(1) of the Act, 21 U.S.C.section 360bbb-3(b)(1), unless the authorization is terminated  or revoked sooner.       Influenza A by PCR NEGATIVE NEGATIVE Final   Influenza B by PCR NEGATIVE NEGATIVE Final    Comment: (NOTE) The Xpert Xpress SARS-CoV-2/FLU/RSV plus assay is intended as an aid in the diagnosis of influenza from Nasopharyngeal swab specimens and should not be used as a sole basis for treatment. Nasal washings and aspirates are unacceptable for Xpert Xpress SARS-CoV-2/FLU/RSV testing.  Fact Sheet for Patients: EntrepreneurPulse.com.au  Fact Sheet for Healthcare Providers: IncredibleEmployment.be  This test is not yet approved or cleared by the [place] FDA and has been authorized for detection and/or diagnosis of SARS-CoV-2 by FDA under an Emergency Use Authorization (EUA). This EUA will remain in effect (meaning this test can be used) for the duration of the COVID-19  declaration under Section 564(b)(1) of the Act, 21 U.S.C. section 360bbb-3(b)(1), unless the authorization is terminated or revoked.  Performed at [facility], [address] [place], [place] [address]   MRSA Next Gen by PCR, Nasal     Status: None   Collection Time: 05/18/21 12:47 AM   Specimen: Nasal Mucosa; Nasal Swab  Result Value Ref Range Status   MRSA by PCR Next Gen NOT DETECTED NOT DETECTED Final    Comment: (NOTE) The GeneXpert MRSA Assay (FDA approved for NASAL specimens  only), is one component of a comprehensive MRSA colonization surveillance program. It is not intended to diagnose MRSA infection nor to guide or monitor treatment for MRSA infections. Test performance is not FDA approved in patients less than 60 years old. Performed at [facility], [place] [address], [place], [place] [address]          Radiology Studies: [place] Lower Ext Art Right  Result Date: 05/17/2021 CLINICAL DATA:  65 year old female with discoloration and right foot pain EXAM: RIGHT LOWER EXTREMITY ARTERIAL DUPLEX SCAN TECHNIQUE: Gray-scale sonography as well as color Doppler and duplex ultrasound was performed to evaluate the lower extremity arteries including the common, superficial and profunda femoral arteries, popliteal artery and calf arteries. COMPARISON:  None. FINDINGS: Right lower Extremity ABI not acquired Directed duplex right lower extremity demonstrates triphasic common femoral artery, profunda femoris. Triphasic SFA throughout its length. Monophasic popliteal artery, posterior tibial artery, peroneal artery, anterior tibial artery. IMPRESSION: Directed duplex of the right lower extremity demonstrates waveforms maintained in the common femoral artery and length of the SFA. Duplex demonstrates occlusive disease of the distal SFA/popliteal artery, with monophasic waveform of the popliteal artery and tibial arteries. Signed, Dulcy Fanny. Dellia Nims, RPVI [facility] Radiology Electronically Signed   By: Corrie Mckusick D.O.   On: 05/17/2021 15:29   VAS [place] LOWER EXTREMITY SAPHENOUS VEIN MAPPING  Result Date: 05/18/2021 LOWER EXTREMITY VEIN MAPPING Patient Name:  LOVIS BONIS Zorn  Date of Exam:   05/18/2021 Medical Rec #: [id number]      Accession #:    [id number] Date of Birth: 07/15/1956     Patient Gender: F Patient Age:   78 years Exam Location:  [facility] Procedure:      VAS [place] LOWER EXTREMITY SAPHENOUS VEIN MAPPING Referring  Phys: Harrell Gave DICKSON --------------------------------------------------------------------------------  Indications:  Ischemic limb Risk Factors: Hypertension, hyperlipidemia, Diabetes, PAD.  Comparison Study: No prior studies. Performing Technologist: Oliver Hum RVT  Examination Guidelines: A complete evaluation includes B-mode imaging, spectral Doppler, color Doppler, and power Doppler as needed of all accessible portions of each vessel. Bilateral testing is considered an integral part of a complete examination. Limited examinations for reoccurring indications may be performed as noted. +---------------+-----------+----------------------+---------------+-----------+   RT Diameter  RT Findings         GSV            LT Diameter  LT Findings      (cm)                                            (cm)                  +---------------+-----------+----------------------+---------------+-----------+      0.47  Saphenofemoral                                                                Junction                                  +---------------+-----------+----------------------+---------------+-----------+      0.35       branching     Proximal thigh                               +---------------+-----------+----------------------+---------------+-----------+      0.30       branching       Mid thigh                                  +---------------+-----------+----------------------+---------------+-----------+      0.30                      Distal thigh                                +---------------+-----------+----------------------+---------------+-----------+      0.28                          Knee                                    +---------------+-----------+----------------------+---------------+-----------+      0.11       branching       Prox calf                                   +---------------+-----------+----------------------+---------------+-----------+      0.11                        Mid calf                                  +---------------+-----------+----------------------+---------------+-----------+      0.12                      Distal calf                                 +---------------+-----------+----------------------+---------------+-----------+ Diagnosing physician: Harold Barban MD Electronically signed by Harold Barban MD on 05/18/2021 at 10:42:45 PM.    Final         Scheduled Meds:  aspirin  81 mg Oral q morning   atorvastatin  20 mg Oral Daily   bisoprolol  10 mg Oral Daily   diclofenac  75 mg Oral BID   empagliflozin  10 mg Oral QAC breakfast   FLUoxetine  20 mg Oral Daily   gabapentin  300  mg Oral BID   insulin aspart  0-15 Units Subcutaneous TID WC   losartan  100 mg Oral Daily   mometasone-formoterol  2 puff Inhalation BID   pantoprazole  40 mg Oral Daily   spironolactone  50 mg Oral Daily   umeclidinium bromide  1 puff Inhalation Daily   Continuous Infusions:  sodium chloride     lactated ringers 10 mL/hr at 05/18/21 0116     LOS: 2 days     Cordelia Poche, MD [facility] 05/19/2021, 8:33 AM  If 7PM-7AM, please contact night-coverage [email or web address]

## 2021-05-19 NOTE — Progress Notes (Addendum)
Patient is noncompliant with insulin and PO medications to reduce blood sugar. Husband also brought in some of her medications from home and they were educated that she can not take home medications here and we can send them to pharmacy. Patient would like to be discharged and return to the hospital for the procedure.

## 2021-05-19 NOTE — Progress Notes (Signed)
Pt arrived to 4e from cath lab. Pt oriented to room and staff. Telemetry box applied and CCMD notified x2 verifiers. Left groin site with marked, soft hematoma. Palpable PT pulse. 2L oxygen applied. Husband at bedside.

## 2021-05-19 NOTE — Progress Notes (Signed)
[facility] [place] PROGRESS NOTE  ASSESSMENT / PLAN: Tammy Mcpherson is a 65 y.o. adult with right lower extremity critical limb ischemia. For angiogram today. All questions answered.   SUBJECTIVE: No interval change. Reports R foot pain.   OBJECTIVE: BP (!) 180/73 (BP Location: Right Arm)   Pulse 99   Temp 97.8 F (36.6 C) (Oral)   Resp 19   Ht '5\' 5"'$  (1.651 m)   Wt 63 kg   SpO2 97%   BMI 23.13 kg/m   No distress Unchanged appearance of R foot + PT doppler signal at ankle  CBC Latest Ref Rng & Units 05/19/2021 05/18/2021 05/17/2021  WBC 4.0 - 10.5 K/uL 13.9(H) 20.0(H) 16.1(H)  Hemoglobin 12.0 - 15.0 g/dL 14.3 14.4 15.3(H)  Hematocrit 36.0 - 46.0 % 42.6 43.7 46.6(H)  Platelets 150 - 400 K/uL 340 420(H) 381     CMP Latest Ref Rng & Units 05/17/2021 04/26/2021 02/08/2021  Glucose 70 - 99 mg/dL 144(H) 145(H) 183(H)  BUN 8 - 23 mg/dL '9 9 11  '$ Creatinine 0.44 - 1.00 mg/dL 0.56 0.63 0.73  Sodium 135 - 145 mmol/L 137 138 136  Potassium 3.5 - 5.1 mmol/L 3.5 4.1 4.0  Chloride 98 - 111 mmol/L 103 102 102  CO2 22 - 32 mmol/L '26 24 22  '$ Calcium 8.9 - 10.3 mg/dL 9.1 10.0 9.3  Total Protein 6.0 - 8.3 g/dL - 6.9 7.0  Total Bilirubin 0.2 - 1.2 mg/dL - 0.4 0.4  Alkaline Phos 39 - 117 U/L - 101 86  AST 0 - 37 U/L - 12 14(L)  ALT 0 - 35 U/L - 14 15    Estimated Creatinine Clearance (by C-G formula based on SCr of 0.56 mg/dL) Female: 64.8 mL/min Female: 82.2 mL/min  Tammy Mcpherson. Stanford Breed, MD [facility] Phone Number: [phone] 05/19/2021 3:08 PM

## 2021-05-19 NOTE — Op Note (Signed)
DATE OF SERVICE: 05/19/2021  PATIENT:  Tammy Mcpherson  65 y.o. adult  PRE-OPERATIVE DIAGNOSIS:  Atherosclerosis of native arteries of right lower extremity causing ischemic rest pain and ulceration  POST-OPERATIVE DIAGNOSIS:  Same  PROCEDURE:   1) US guided left common femoral access 2) Aortogram 3) Right lower extremity angiogram with third order cannulation (153m total contrast) 4) Conscious sedation (61 minutes) 5) Right popliteal angioplasty and stenting (5x1218mInnova) 6) Right anterior tibial angioplasty (3x6040mterling)  SURGEON:  ThoYevonne AlineawStanford BreedD  ASSISTANT: none  ANESTHESIA:   local and IV sedation  ESTIMATED BLOOD LOSS: min  LOCAL MEDICATIONS USED:  LIDOCAINE   COUNTS: confirmed correct.  PATIENT DISPOSITION:  PACU - hemodynamically stable.   Delay start of Pharmacological VTE agent (>24hrs) due to surgical blood loss or risk of bleeding: no  INDICATION FOR PROCEDURE: Tammy Mcpherson a [age]o. adult with right foot gangrene and evidence of peripheral arterial disease. After careful discussion of risks, benefits, and alternatives the patient was offered angiogram with intervention.  The patient understood and wished to proceed.  OPERATIVE FINDINGS:  Small arteries throughout  Terminal aorta and iliac arteries: No flow limiting stenosis in terminal aorta or iliac arteries  Right lower extremity: Common femoral artery: patent without flow limiting stenosis  Profunda femoris artery: patent without flow limiting stenosis   Superficial femoral artery: diffusely diseased Popliteal artery: above knee diffusely diseased (>95% stenosis). Behind knee and below knee patent. Anterior tibial artery: chronic occlusion at the origin causing critical stenosis of terminal popliteal / proximal TP trunk. Patent beyond the occlusion. Tibioperoneal trunk: patent without flow limiting stenosis Peroneal artery: patent without flow limiting stenosis Posterior tibial artery:  patent without flow limiting stenosis Pedal circulation: pedal arch intact  Left lower extremity: Common femoral artery: patent without flow limiting stenosis  Profunda femoris artery: patent without flow limiting stenosis  Superficial femoral artery: Terminal SFA has >75% at hunter's canal Popliteal artery: patent without flow limiting stenosis Anterior tibial artery: patent without flow limiting stenosis Tibioperoneal trunk: patent without flow limiting stenosis Peroneal artery: patent without flow limiting stenosis Posterior tibial artery: patent without flow limiting stenosis Pedal circulation: not well evaluated  DESCRIPTION OF PROCEDURE: After identification of the patient in the pre-operative holding area, the patient was transferred to the operating room. The patient was positioned supine on the operating room table. Anesthesia was induced. The groins was prepped and draped in standard fashion. A surgical pause was performed confirming correct patient, procedure, and operative location.  The left groin was anesthetized with subcutaneous injection of 1% lidocaine. Using ultrasound guidance, the left common femoral artery was accessed with micropuncture technique. Fluoroscopy was used to confirm cannulation over the femoral head. The 11F sheath was upsized to 1F.   A Benson wire was advanced into the distal aorta. Over the wire an omni flush catheter was advanced to the level of L2. Aortogram was performed - see above for details.  The right common iliac artery was selected with a glidewire advantage guidewire. The wire was advanced into the common femoral artery. Over the wire the omni flush catheter was advanced into the external iliac artery. Selective angiography was performed - see above for details.   The decision was made to intervene. The patient was heparinized with 7000 units of heparin. The 1F sheath was exchanged for a 22F x 45cm sheath. Selective angiography of the left lower  extremity was performed prior to intervention.   The lesions were treated  with: Popliteal stenting and angioplasty (5x144m Innova) Anterior tibial angioplasty (3x683mSterling)  Completion angiography revealed:  Resolution of popliteal / AT stenosis  Perclose was used to close the arteriotomy. Closure was complicated by small hematoma. Hemostasis achieved with manual pressure.  Conscious sedation was administered with the use of IV fentanyl and midazolam under continuous physician and nurse monitoring.  Heart rate, blood pressure, and oxygen saturation were continuously monitored.  Total sedation time was 61 minutes  Upon completion of the case instrument and sharps counts were confirmed correct. The patient was transferred to the PACU in good condition. I was present for all portions of the procedure.  PLAN: Inline flow to the foot achieved.  Patient needs aspirin 81 mg by mouth daily indefinitely.  High intensity statin daily indefinitely.  Plavix 75 mg by mouth daily for 1 month (end date 06/19/2021).  Continue local care to the foot.  Wounds may heal with wound care alone.  ThYevonne AlineHaStanford BreedMD [facility] Number: [phone]/18/2022 3:09 PM

## 2021-05-20 ENCOUNTER — Encounter (HOSPITAL_COMMUNITY): Payer: Self-pay | Admitting: Vascular Surgery

## 2021-05-20 LAB — GLUCOSE, CAPILLARY
Glucose-Capillary: 117 mg/dL — ABNORMAL HIGH (ref 70–99)
Glucose-Capillary: 117 mg/dL — ABNORMAL HIGH (ref 70–99)

## 2021-05-20 MED ORDER — ATORVASTATIN CALCIUM 40 MG PO TABS: 40.0000 mg | ORAL_TABLET | Freq: Every day | ORAL | 2 refills | Status: DC

## 2021-05-20 MED ORDER — CLOPIDOGREL BISULFATE 75 MG PO TABS: 75.0000 mg | ORAL_TABLET | Freq: Every day | ORAL | 0 refills | Status: DC

## 2021-05-20 NOTE — Discharge Summary (Signed)
Physician Discharge Summary  Tammy Mcpherson XYB:[id number] DOB: [birth year] DOA: 05/17/2021  PCP: [name], NP  Admit date: 05/17/2021 Discharge date: 05/20/2021  Admitted From: Home Disposition: Home  Recommendations for Outpatient Follow-up:  Follow up with PCP in 1 week Follow up with vascular surgery as an outpatient Please obtain BMP/CBC in one week Please follow up on the following pending results: None  Home Health: None Equipment/Devices: None  Discharge Condition: Stable CODE STATUS: Full code Diet recommendation: Heart healthy   Brief/Interim Summary:  Admission HPI written by Donnamae Jude, MD  HPI: Tammy Mcpherson is a 65 y.o. adult with medical history significant of T2DM, HTN, HLD, PAD and on-going tobacco use who has 1-2 week h/o right foot pain. Notes she thought she stubbed her toe. Foot has been cold and discolored. Notes increasing pain which has not improved with tylenol.    Hospital course:  Critical limb ischemia PAD Right lower extremity. Associated rest pain. Vascular surgery consulted and patient underwent right popliteal angioplasty and stenting in addition to right anterior tibial angioplasty. Recommendation for aspirin indefinitely, Plavix for 30 days and high intensity statin. Patient increased to Lipitor 40 mg.   Combined systolic and diastolic heart failure Stable. Patient is on bisoprolol, losartan, spironolactone as an outpatient. Continue on discharge.   COPD Patient is on albuterol, Breztri as an outpatient. Stable. Continue on discharge.   Primary hypertension Patient is on bisoprolol, losartan, spironolactone as an outpatient. Continue on discharge.   Diabetes mellitus, type 2 Patient is on metformin, Jardiance as an outpatient.  Metformin held on admission. Continue on discharge.   Tobacco use Counseled on admission.  Nicotine patch offered and declined by patient.   Vitamin B12 deficiency Patient receives vitamin B12  injections every 30 days.   Gastric ulcer Continue Protonix.    Depression Anxiety Patient is on Xanax and Prozac as an outpatient. Continue on discharge.  Discharge Diagnoses:  Principal Problem:   Lower limb ischemia Active Problems:   Acute combined systolic and diastolic heart failure (HCC)   Chronic obstructive pulmonary disease (HCC)   Essential hypertension   Tobacco abuse   Gastric ulcer   Hyperlipidemia   PAD (peripheral artery disease) (HCC)   Type 2 diabetes mellitus (HCC)   Vitamin B12 deficiency    Discharge Instructions   Allergies as of 05/20/2021       Reactions   Bee Venom Swelling   Codeine Anaphylaxis   Tongue swelled   Penicillins Swelling, Rash   Has patient had a PCN reaction causing immediate rash, facial/tongue/throat swelling, SOB or lightheadedness with hypotension: No, delayed Has patient had a PCN reaction causing severe rash involving mucus membranes or skin necrosis: No Has patient had a PCN reaction that required hospitalization No Has patient had a PCN reaction occurring within the last 10 years: No If all of the above answers are "NO", then may proceed with Cephalosporin use.   Bupropion Other (See Comments)   "Made my skin crawl"   Sudafed [pseudoephedrine Hcl] Rash        Medication List     TAKE these medications    acetaminophen 500 MG tablet Commonly known as: TYLENOL Take 500 mg by mouth at bedtime as needed for moderate pain.   albuterol (2.5 MG/3ML) 0.083% nebulizer solution Commonly known as: PROVENTIL Take 3 mLs (2.5 mg total) by nebulization every 4 (four) hours as needed for wheezing or shortness of breath.   albuterol 108 (90 Base) MCG/ACT inhaler  Commonly known as: VENTOLIN HFA INHALE TWO PUFFS INTO THE LUNGS EVERY SIX HOURS AS NEEDED FOR WHEEZING OR SHORTNESS OF BREATH   ALPRAZolam 0.5 MG tablet Commonly known as: XANAX TAKE 1 TABLET BY MOUTH AT BEDTIME AS NEEDED FOR ANXIETY What changed: See the new  instructions.   aspirin 81 MG chewable tablet Chew 81 mg by mouth every morning.   atorvastatin 40 MG tablet Commonly known as: LIPITOR Take 1 tablet (40 mg total) by mouth daily. What changed:  medication strength See the new instructions.   bisoprolol 10 MG tablet Commonly known as: ZEBETA TAKE ONE (1) TABLET BY MOUTH EVERY DAY   blood glucose meter kit and supplies Dispense based on patient and insurance preference. Use up to four times daily as directed. (FOR ICD-10 E10.9, E11.9).   Breztri Aerosphere 160-9-4.8 MCG/ACT Aero Generic drug: Budeson-Glycopyrrol-Formoterol Inhale 2 puffs into the lungs 2 (two) times daily.   clopidogrel 75 MG tablet Commonly known as: PLAVIX Take 1 tablet (75 mg total) by mouth daily with breakfast. Start taking on: May 21, 2021   cyanocobalamin 1000 MCG/ML injection Commonly known as: (VITAMIN B-12) INJECT 1 ML INTO THE MUSCLE EVERY 30 DAYS. What changed: See the new instructions.   cyclobenzaprine 5 MG tablet Commonly known as: FLEXERIL TAKE ONE TABLET BY MOUTH UP TO EVERY 8 HOURS AS NEEDED. START WITH ONE TABLET AT BEDTIME AS NEEDED DUE TO SEDATION. What changed: See the new instructions.   diclofenac 75 MG EC tablet Commonly known as: VOLTAREN Take 1 tablet (75 mg total) by mouth 2 (two) times daily.   dicyclomine 10 MG capsule Commonly known as: BENTYL Take 10 mg by mouth as needed for spasms.   FLUoxetine 20 MG capsule Commonly known as: PROZAC Take 1 capsule (20 mg total) by mouth daily.   FreeStyle Libre 14 Day Reader Kerrin Mo See admin instructions.   FreeStyle Libre 2 Reader [employer] Check blood glucose up to 4 times daily   gabapentin 300 MG capsule Commonly known as: NEURONTIN TAKE ONE CAPSULE (300 MG TOTAL) BY MOUTHTWO TIMES DAILY.   glucose blood test strip Commonly known as: OneTouch Ultra Use as instructed   glucose monitoring kit monitoring kit Use to check blood sugar BID as directed   Jardiance 10 MG  Tabs tablet Generic drug: empagliflozin Take 10 mg by mouth daily before breakfast.   Lactaid 3000 units tablet Generic drug: lactase Take by mouth as needed.   losartan 100 MG tablet Commonly known as: COZAAR TAKE ONE (1) TABLET BY MOUTH EVERY DAY   metFORMIN 1000 MG tablet Commonly known as: GLUCOPHAGE Take 0.5 tablets (500 mg total) by mouth 2 (two) times daily with a meal. Takes 500 mg twice daily.  Total of 1000 mg daily.   Misc. Devices Misc Please provide supplies (needle, syringe, alcohol swabs) needed for patient to self-administer B-12 injections monthly.   OneTouch Delica Plus [id number] Misc 1 each by Does not apply route 2 (two) times a day.   pantoprazole 40 MG tablet Commonly known as: PROTONIX TAKE ONE (1) TABLET 30 MINS BEFORE YOUR FIRST MEAL.   spironolactone 50 MG tablet Commonly known as: ALDACTONE Take 1 tablet (50 mg total) by mouth daily.   UltiCare Tuberculin Safety Syr 25G X 1" 1 ML Misc Generic drug: Tuberculin-Allergy Syringes USE TO ADMINISTER INJECTABLE VITAMIN B12.   Vitamin D 50 MCG (2000 UT) Caps Take 1 capsule (2,000 Units total) by mouth daily.        Follow-up Information  [name], NP. Schedule an appointment as soon as possible for a visit in 1 week(s).   Specialty: Adult Health Nurse Practitioner Why: For hospital follow-up Contact information: [place] [address] [phone]                Allergies  Allergen Reactions   Bee Venom Swelling   Codeine Anaphylaxis    Tongue swelled   Penicillins Swelling and Rash    Has patient had a PCN reaction causing immediate rash, facial/tongue/throat swelling, SOB or lightheadedness with hypotension: No, delayed Has patient had a PCN reaction causing severe rash involving mucus membranes or skin necrosis: No Has patient had a PCN reaction that required hospitalization No Has patient had a PCN reaction occurring within the last 10 years: No If all of  the above answers are "NO", then may proceed with Cephalosporin use.    Bupropion Other (See Comments)    "Made my skin crawl"   Sudafed [Pseudoephedrine Hcl] Rash    Consultations: Vascular surgery   Procedures/Studies: PERIPHERAL VASCULAR CATHETERIZATION  Result Date: 05/19/2021 DATE OF SERVICE: 05/19/2021  PATIENT:  TNIYA BOWDITCH  65 y.o. adult  PRE-OPERATIVE DIAGNOSIS:  Atherosclerosis of native arteries of right lower extremity causing ischemic rest pain and ulceration  POST-OPERATIVE DIAGNOSIS:  Same  PROCEDURE:  1) US guided left common femoral access 2) Aortogram 3) Right lower extremity angiogram with third order cannulation (175m total contrast) 4) Conscious sedation (61 minutes) 5) Right popliteal angioplasty and stenting (5x1243mInnova) 6) Right anterior tibial angioplasty (3x6044mterling)  SURGEON:  ThoYevonne AlineawStanford BreedD  ASSISTANT: none  ANESTHESIA:   local and IV sedation  ESTIMATED BLOOD LOSS: min  LOCAL MEDICATIONS USED:  LIDOCAINE  COUNTS: confirmed correct.  PATIENT DISPOSITION:  PACU - hemodynamically stable.  Delay start of Pharmacological VTE agent (>24hrs) due to surgical blood loss or risk of bleeding: no  INDICATION FOR PROCEDURE: DonSARAN LAVIOLETTE a 64 47o. adult with right foot gangrene and evidence of peripheral arterial disease. After careful discussion of risks, benefits, and alternatives the patient was offered angiogram with intervention.  The patient understood and wished to proceed.  OPERATIVE FINDINGS: Small arteries throughout  Terminal aorta and iliac arteries: No flow limiting stenosis in terminal aorta or iliac arteries  Right lower extremity: Common femoral artery: patent without flow limiting stenosis Profunda femoris artery: patent without flow limiting stenosis  Superficial femoral artery: diffusely diseased Popliteal artery: above knee diffusely diseased (>95% stenosis). Behind knee and below knee patent. Anterior tibial artery: chronic occlusion at the  origin causing critical stenosis of terminal popliteal / proximal TP trunk. Patent beyond the occlusion. Tibioperoneal trunk: patent without flow limiting stenosis Peroneal artery: patent without flow limiting stenosis Posterior tibial artery: patent without flow limiting stenosis Pedal circulation: pedal arch intact  Left lower extremity: Common femoral artery: patent without flow limiting stenosis Profunda femoris artery: patent without flow limiting stenosis Superficial femoral artery: Terminal SFA has >75% at hunter[place] canal Popliteal artery: patent without flow limiting stenosis Anterior tibial artery: patent without flow limiting stenosis Tibioperoneal trunk: patent without flow limiting stenosis Peroneal artery: patent without flow limiting stenosis Posterior tibial artery: patent without flow limiting stenosis Pedal circulation: not well evaluated  DESCRIPTION OF PROCEDURE: After identification of the patient in the pre-operative holding area, the patient was transferred to the operating room. The patient was positioned supine on the operating room table. Anesthesia was induced. The groins was prepped and draped in standard fashion. A  surgical pause was performed confirming correct patient, procedure, and operative location.  The left groin was anesthetized with subcutaneous injection of 1% lidocaine. Using ultrasound guidance, the left common femoral artery was accessed with micropuncture technique. Fluoroscopy was used to confirm cannulation over the femoral head. The 823F sheath was upsized to 23F.  A Benson wire was advanced into the distal aorta. Over the wire an omni flush catheter was advanced to the level of L2. Aortogram was performed - see above for details.  The right common iliac artery was selected with a glidewire advantage guidewire. The wire was advanced into the common femoral artery. Over the wire the omni flush catheter was advanced into the external iliac artery. Selective angiography was  performed - see above for details.  The decision was made to intervene. The patient was heparinized with 7000 units of heparin. The 23F sheath was exchanged for a 50F x 45cm sheath. Selective angiography of the left lower extremity was performed prior to intervention.  The lesions were treated with: Popliteal stenting and angioplasty (5x19m Innova) Anterior tibial angioplasty (3x682mSterling)  Completion angiography revealed: Resolution of popliteal / AT stenosis  Perclose was used to close the arteriotomy. Closure was complicated by small hematoma. Hemostasis achieved with manual pressure.  Conscious sedation was administered with the use of IV fentanyl and midazolam under continuous physician and nurse monitoring.  Heart rate, blood pressure, and oxygen saturation were continuously monitored.  Total sedation time was 61 minutes  Upon completion of the case instrument and sharps counts were confirmed correct. The patient was transferred to the PACU in good condition. I was present for all portions of the procedure.  PLAN: Inline flow to the foot achieved.  Patient needs aspirin 81 mg by mouth daily indefinitely.  High intensity statin daily indefinitely.  Plavix 75 mg by mouth daily for 1 month (end date 06/19/2021).  Continue local care to the foot.  Wounds may heal with wound care alone.  ThYevonne AlineHaStanford BreedMD [facility] Number: ([phone]/18/2022 3:09 PM   [place] Ext Art Right  Result Date: 05/17/2021 CLINICAL DATA:  [age]ear old female with discoloration and right foot pain EXAM: RIGHT LOWER EXTREMITY ARTERIAL DUPLEX SCAN TECHNIQUE: Gray-scale sonography as well as color Doppler and duplex ultrasound was performed to evaluate the lower extremity arteries including the common, superficial and profunda femoral arteries, popliteal artery and calf arteries. COMPARISON:  None. FINDINGS: Right lower Extremity ABI not acquired Directed duplex right lower  extremity demonstrates triphasic common femoral artery, profunda femoris. Triphasic SFA throughout its length. Monophasic popliteal artery, posterior tibial artery, peroneal artery, anterior tibial artery. IMPRESSION: Directed duplex of the right lower extremity demonstrates waveforms maintained in the common femoral artery and length of the SFA. Duplex demonstrates occlusive disease of the distal SFA/popliteal artery, with monophasic waveform of the popliteal artery and tibial arteries. Signed, JaDulcy FannyWaDellia NimsRPVI [facility] Electronically Signed   By: JaCorrie Mckusick.O.   On: 05/17/2021 15:29   DG Foot Complete Right  Result Date: 05/04/2021 CLINICAL DATA:  Acute injury right foot. EXAM: RIGHT FOOT COMPLETE - 3+ VIEW COMPARISON:  07/10/2013 FINDINGS: Chronic postoperative changes in the right foot. There is surgical staple involving the great toe proximal phalanx and 2 screws in the first metatarsal bone. Prior surgical fusion at the second toe PIP joint. Chronic changes at the second and third MTP joints. Negative for acute fracture or dislocation.  Mild spurring involving the calcaneus. IMPRESSION: 1. No acute abnormality to the right foot. 2. Old postsurgical changes. Electronically Signed   By: Markus Daft M.D.   On: 05/04/2021 13:05   VAS [place] LOWER EXTREMITY SAPHENOUS VEIN MAPPING  Result Date: 05/18/2021 LOWER EXTREMITY VEIN MAPPING Patient Name:  ZAKARIA FROMER Yellin  Date of Exam:   05/18/2021 Medical Rec #: [id number]      Accession #:    [id number] Date of Birth: Aug 13, 1956     Patient Gender: F Patient Age:   35 years Exam Location:  [facility] Procedure:      VAS [place] LOWER EXTREMITY SAPHENOUS VEIN MAPPING Referring Phys: Harrell Gave DICKSON --------------------------------------------------------------------------------  Indications:  Ischemic limb Risk Factors: Hypertension, hyperlipidemia, Diabetes, PAD.  Comparison Study: No prior  studies. Performing Technologist: Oliver Hum RVT  Examination Guidelines: A complete evaluation includes B-mode imaging, spectral Doppler, color Doppler, and power Doppler as needed of all accessible portions of each vessel. Bilateral testing is considered an integral part of a complete examination. Limited examinations for reoccurring indications may be performed as noted. +---------------+-----------+----------------------+---------------+-----------+   RT Diameter  RT Findings         GSV            LT Diameter  LT Findings      (cm)                                            (cm)                  +---------------+-----------+----------------------+---------------+-----------+      0.47                     Saphenofemoral                                                                Junction                                  +---------------+-----------+----------------------+---------------+-----------+      0.35       branching     Proximal thigh                               +---------------+-----------+----------------------+---------------+-----------+      0.30       branching       Mid thigh                                  +---------------+-----------+----------------------+---------------+-----------+      0.30                      Distal thigh                                +---------------+-----------+----------------------+---------------+-----------+      0.28  Knee                                    +---------------+-----------+----------------------+---------------+-----------+      0.11       branching       Prox calf                                  +---------------+-----------+----------------------+---------------+-----------+      0.11                        Mid calf                                  +---------------+-----------+----------------------+---------------+-----------+      0.12                       Distal calf                                 +---------------+-----------+----------------------+---------------+-----------+ Diagnosing physician: Harold Barban MD Electronically signed by Harold Barban MD on 05/18/2021 at 10:42:45 PM.    Final      Subjective: No issues today. Pain in right foot has improved. Has been ambulating without issue.  Discharge Exam: Vitals:   05/20/21 0417 05/20/21 0833  BP: (!) 137/58   Pulse: 98   Resp: 20   Temp: 98 F (36.7 C)   SpO2: 91% 92%   Vitals:   05/19/21 2240 05/20/21 0415 05/20/21 0417 05/20/21 0833  BP: 126/73  (!) 137/58   Pulse: 92  98   Resp: (!) _0 Temp: 98.4 F (36.9 C)  98 F (36.7 C)   TempSrc: Oral  Oral   SpO2: 94%  91% 92%  Weight:      Height:        General: Pt is alert, awake, not in acute distress Cardiovascular: RRR, S1/S2 +, no rubs, no gallops Respiratory: CTA bilaterally, no wheezing, no rhonchi Abdominal: Soft, NT, ND, bowel sounds + Extremities: no edema, no cyanosis    The results of significant diagnostics from this hospitalization (including imaging, microbiology, ancillary and laboratory) are listed below for reference.     Microbiology: Recent Results (from the past 240 hour(s))  Resp Panel by RT-PCR (Flu A&B, Covid) Nasopharyngeal Swab     Status: None   Collection Time: 05/17/21  5:00 PM   Specimen: Nasopharyngeal Swab; Nasopharyngeal(NP) swabs in vial transport medium  Result Value Ref Range Status   SARS Coronavirus 2 by RT PCR NEGATIVE NEGATIVE Final    Comment: (NOTE) SARS-CoV-2 target nucleic acids are NOT DETECTED.  The SARS-CoV-2 RNA is generally detectable in upper respiratory specimens during the acute phase of infection. The lowest concentration of SARS-CoV-2 viral copies this assay can detect is 138 copies/mL. A negative result does not preclude SARS-Cov-2 infection and should not be used as the sole basis for treatment or other patient management decisions. A  negative result may occur with  improper specimen collection/handling, submission of specimen other than nasopharyngeal swab, presence of viral mutation(s) within the areas targeted by this assay, and inadequate number of viral copies(<138 copies/mL). A negative result must be combined with clinical observations,  patient history, and epidemiological information. The expected result is Negative.  Fact Sheet for Patients:  EntrepreneurPulse.com.au  Fact Sheet for Healthcare Providers:  IncredibleEmployment.be  This test is no t yet approved or cleared by the [place] FDA and  has been authorized for detection and/or diagnosis of SARS-CoV-2 by FDA under an Emergency Use Authorization (EUA). This EUA will remain  in effect (meaning this test can be used) for the duration of the COVID-19 declaration under Section 564(b)(1) of the Act, 21 U.S.C.section 360bbb-3(b)(1), unless the authorization is terminated  or revoked sooner.       Influenza A by PCR NEGATIVE NEGATIVE Final   Influenza B by PCR NEGATIVE NEGATIVE Final    Comment: (NOTE) The Xpert Xpress SARS-CoV-2/FLU/RSV plus assay is intended as an aid in the diagnosis of influenza from Nasopharyngeal swab specimens and should not be used as a sole basis for treatment. Nasal washings and aspirates are unacceptable for Xpert Xpress SARS-CoV-2/FLU/RSV testing.  Fact Sheet for Patients: EntrepreneurPulse.com.au  Fact Sheet for Healthcare Providers: IncredibleEmployment.be  This test is not yet approved or cleared by the [place] FDA and has been authorized for detection and/or diagnosis of SARS-CoV-2 by FDA under an Emergency Use Authorization (EUA). This EUA will remain in effect (meaning this test can be used) for the duration of the COVID-19 declaration under Section 564(b)(1) of the Act, 21 U.S.C. section 360bbb-3(b)(1), unless the authorization  is terminated or revoked.  Performed at [facility], [address] [place], [place] [address]   MRSA Next Gen by PCR, Nasal     Status: None   Collection Time: 05/18/21 12:47 AM   Specimen: Nasal Mucosa; Nasal Swab  Result Value Ref Range Status   MRSA by PCR Next Gen NOT DETECTED NOT DETECTED Final    Comment: (NOTE) The GeneXpert MRSA Assay (FDA approved for NASAL specimens only), is one component of a comprehensive MRSA colonization surveillance program. It is not intended to diagnose MRSA infection nor to guide or monitor treatment for MRSA infections. Test performance is not FDA approved in patients less than 38 years old. Performed at [facility], [place] [address] [place], [place] [address]      Labs: BNP (last 3 results) No results for input(s): BNP in the last 8760 hours. Basic Metabolic Panel: Recent Labs  Lab 05/17/21 1351 05/19/21 0759  NA 137  --   K 3.5  --   CL 103  --   CO2 26  --   GLUCOSE 144*  --   BUN 9  --   CREATININE 0.56 0.54  CALCIUM 9.1  --    Liver Function Tests: No results for input(s): AST, ALT, ALKPHOS, BILITOT, PROT, ALBUMIN in the last 168 hours. No results for input(s): LIPASE, AMYLASE in the last 168 hours. No results for input(s): AMMONIA in the last 168 hours. CBC: Recent Labs  Lab 05/17/21 1351 05/18/21 0809 05/19/21 0749  WBC 16.1* 20.0* 13.9*  NEUTROABS 12.5*  --   --   HGB 15.3* 14.4 14.3  HCT 46.6* 43.7 42.6  MCV 97.3 94.8 95.5  PLT 381 420* 340   Cardiac Enzymes: No results for input(s): CKTOTAL, CKMB, CKMBINDEX, TROPONINI in the last 168 hours. BNP: Invalid input(s): POCBNP CBG: Recent Labs  Lab 05/18/21 1617 05/18/21 2158 05/19/21 1806 05/19/21 2129 05/20/21 0622  GLUCAP 180* 165* 119* 143* 117*   D-Dimer No results for input(s): DDIMER in the last 72 hours. Hgb A1c Recent Labs  05/17/21 1353  HGBA1C 7.2*   Lipid Profile No results for input(s): CHOL, HDL, LDLCALC, TRIG, CHOLHDL,  LDLDIRECT in the last 72 hours. Thyroid function studies No results for input(s): TSH, T4TOTAL, T3FREE, THYROIDAB in the last 72 hours.  Invalid input(s): FREET3 Anemia work up No results for input(s): VITAMINB12, FOLATE, FERRITIN, TIBC, IRON, RETICCTPCT in the last 72 hours. Urinalysis    Component Value Date/Time   COLORURINE STRAW (A) 01/21/2019 1645   APPEARANCEUR CLEAR 01/21/2019 1645   LABSPEC 1.029 01/21/2019 1645   PHURINE 6.0 01/21/2019 1645   GLUCOSEU >=500 (A) 01/21/2019 1645   HGBUR NEGATIVE 01/21/2019 1645   BILIRUBINUR negative 06/17/2020 1534   KETONESUR negative 06/17/2020 1534   [place] 01/21/2019 1645   PROTEINUR negative 06/17/2020 1534   PROTEINUR NEGATIVE 01/21/2019 1645   UROBILINOGEN 0.2 06/17/2020 1534   UROBILINOGEN 0.2 11/03/2012 1701   NITRITE Negative 06/17/2020 1534   NITRITE NEGATIVE 01/21/2019 1645   LEUKOCYTESUR Moderate (2+) (A) 06/17/2020 1534   LEUKOCYTESUR NEGATIVE 01/21/2019 1645   Sepsis Labs Invalid input(s): PROCALCITONIN,  WBC,  LACTICIDVEN Microbiology Recent Results (from the past 240 hour(s))  Resp Panel by RT-PCR (Flu A&B, Covid) Nasopharyngeal Swab     Status: None   Collection Time: 05/17/21  5:00 PM   Specimen: Nasopharyngeal Swab; Nasopharyngeal(NP) swabs in vial transport medium  Result Value Ref Range Status   SARS Coronavirus 2 by RT PCR NEGATIVE NEGATIVE Final    Comment: (NOTE) SARS-CoV-2 target nucleic acids are NOT DETECTED.  The SARS-CoV-2 RNA is generally detectable in upper respiratory specimens during the acute phase of infection. The lowest concentration of SARS-CoV-2 viral copies this assay can detect is 138 copies/mL. A negative result does not preclude SARS-Cov-2 infection and should not be used as the sole basis for treatment or other patient management decisions. A negative result may occur with  improper specimen collection/handling, submission of specimen other than nasopharyngeal swab,  presence of viral mutation(s) within the areas targeted by this assay, and inadequate number of viral copies(<138 copies/mL). A negative result must be combined with clinical observations, patient history, and epidemiological information. The expected result is Negative.  Fact Sheet for Patients:  EntrepreneurPulse.com.au  Fact Sheet for Healthcare Providers:  IncredibleEmployment.be  This test is no t yet approved or cleared by the [place] FDA and  has been authorized for detection and/or diagnosis of SARS-CoV-2 by FDA under an Emergency Use Authorization (EUA). This EUA will remain  in effect (meaning this test can be used) for the duration of the COVID-19 declaration under Section 564(b)(1) of the Act, 21 U.S.C.section 360bbb-3(b)(1), unless the authorization is terminated  or revoked sooner.       Influenza A by PCR NEGATIVE NEGATIVE Final   Influenza B by PCR NEGATIVE NEGATIVE Final    Comment: (NOTE) The Xpert Xpress SARS-CoV-2/FLU/RSV plus assay is intended as an aid in the diagnosis of influenza from Nasopharyngeal swab specimens and should not be used as a sole basis for treatment. Nasal washings and aspirates are unacceptable for Xpert Xpress SARS-CoV-2/FLU/RSV testing.  Fact Sheet for Patients: EntrepreneurPulse.com.au  Fact Sheet for Healthcare Providers: IncredibleEmployment.be  This test is not yet approved or cleared by the [place] FDA and has been authorized for detection and/or diagnosis of SARS-CoV-2 by FDA under an Emergency Use Authorization (EUA). This EUA will remain in effect (meaning this test can be used) for the duration of the COVID-19 declaration under Section 564(b)(1) of the Act, 21 U.S.C. section 360bbb-3(b)(1), unless  the authorization is terminated or revoked.  Performed at [facility], [address], [place], [place] [address]   MRSA Next Gen by PCR,  Nasal     Status: None   Collection Time: 05/18/21 12:47 AM   Specimen: Nasal Mucosa; Nasal Swab  Result Value Ref Range Status   MRSA by PCR Next Gen NOT DETECTED NOT DETECTED Final    Comment: (NOTE) The GeneXpert MRSA Assay (FDA approved for NASAL specimens only), is one component of a comprehensive MRSA colonization surveillance program. It is not intended to diagnose MRSA infection nor to guide or monitor treatment for MRSA infections. Test performance is not FDA approved in patients less than 51 years old. Performed at [facility], [place] [address] [place], [place] [address]      Time coordinating discharge:  35 minutes  SIGNED:   Cordelia Poche, MD [facility] 05/20/2021, 11:12 AM

## 2021-05-20 NOTE — Progress Notes (Signed)
Pt discharged from 4E to home with husband. D/c teaching done and medication education provided. Tele and IV removed.   Raelyn Number, RN

## 2021-05-20 NOTE — Discharge Instructions (Signed)
Tammy Mcpherson,  You were in the hospital because of poor blood flow to your right foot. You required a stent to be placed. You will need to be on aspirin indefinitely and Plavix for one month. I have increased your Lipitor to 40 mg daily. Please follow-up with your PCP.

## 2021-05-20 NOTE — Progress Notes (Signed)
   VASCULAR SURGERY ASSESSMENT & PLAN:   POD 1 ENDOVASCULAR REVASCULARIZATION: The patient underwent successful angioplasty and stenting of the right popliteal artery and anterior tibial artery yesterday by Dr. Stanford Breed with an excellent result.  The foot is hyperemic with brisk Doppler signals.  The patient can be discharged from my standpoint.  I will arrange follow-up with me in 6 weeks to keep a close eye on her right foot.  Patient needs aspirin 81 mg by mouth daily indefinitely.  High intensity statin daily indefinitely.  Plavix 75 mg by mouth daily for 1 month (end date 06/19/2021).   Vascular surgery will be available as needed.   SUBJECTIVE:   No complaints.  Her right foot feels much better.  PHYSICAL EXAM:   Vitals:   05/19/21 2240 05/20/21 0415 05/20/21 0417 05/20/21 0833  BP: 126/73  (!) 137/58   Pulse: 92  98   Resp: (!) '21 20 20   '$ Temp: 98.4 F (36.9 C)  98 F (36.7 C)   TempSrc: Oral  Oral   SpO2: 94%  91% 92%  Weight:      Height:       Brisk dorsalis pedis and posterior tibial signal with the Doppler. No left groin hematoma. The right foot looks better.   LABS:   Lab Results  Component Value Date   WBC 13.9 (H) 05/19/2021   HGB 14.3 05/19/2021   HCT 42.6 05/19/2021   MCV 95.5 05/19/2021   PLT 340 05/19/2021   Lab Results  Component Value Date   CREATININE 0.54 05/19/2021   Lab Results  Component Value Date   INR 0.98 04/24/2017   CBG (last 3)  Recent Labs    05/19/21 1806 05/19/21 2129 05/20/21 0622  GLUCAP 119* 143* 117*    PROBLEM LIST:    Principal Problem:   Lower limb ischemia Active Problems:   Acute combined systolic and diastolic heart failure (HCC)   Chronic obstructive pulmonary disease (HCC)   Essential hypertension   Tobacco abuse   Gastric ulcer   Hyperlipidemia   PAD (peripheral artery disease) (HCC)   Type 2 diabetes mellitus (HCC)   Vitamin B12 deficiency   CURRENT MEDS:    aspirin  81 mg Oral q morning    atorvastatin  20 mg Oral Daily   bisoprolol  10 mg Oral Daily   clopidogrel  75 mg Oral Q breakfast   diclofenac  75 mg Oral BID   empagliflozin  10 mg Oral QAC breakfast   FLUoxetine  20 mg Oral Daily   gabapentin  300 mg Oral BID   heparin  5,000 Units Subcutaneous Q8H   insulin aspart  0-15 Units Subcutaneous TID WC   losartan  100 mg Oral Daily   mometasone-formoterol  2 puff Inhalation BID   pantoprazole  40 mg Oral Daily   sodium chloride flush  3 mL Intravenous Q12H   spironolactone  50 mg Oral Daily   umeclidinium bromide  1 puff Inhalation Daily    [name] Office: [phone] 05/20/2021

## 2021-05-20 NOTE — Care Management Important Message (Signed)
Important Message  Patient Details  Name: Tammy Mcpherson MRN: [id number] Date of Birth: 1956/07/19   Medicare Important Message Given:  Yes     Shelda Altes 05/20/2021, 9:07 AM

## 2021-05-23 ENCOUNTER — Telehealth: Payer: Self-pay | Admitting: *Deleted

## 2021-05-23 NOTE — Telephone Encounter (Signed)
Transition Care Management Follow-up Telephone Call Date of discharge and from where: Tammy Mcpherson How have you been since you were released from the hospital?  Feeling better Any questions or concerns? No  Items Reviewed: Did the pt receive and understand the discharge instructions provided? Yes  Medications obtained and verified? Yes  Other? No  Any new allergies since your discharge? No  Dietary orders reviewed? No Do you have support at home? Yes   Home Care and Equipment/Supplies: Were home health services ordered? not applicable If so, what is the name of the agency?   Has the agency set up a time to come to the patient's home? not applicable Were any new equipment or medical supplies ordered?  No What is the name of the medical supply agency?  Were you able to get the supplies/equipment? not applicable Do you have any questions related to the use of the equipment or supplies? No  Functional Questionnaire: (I = Independent and D = Dependent) ADLs: I   Bathing/Dressing- I  Meal Prep- I  Eating- I  Maintaining continence- I  Transferring/Ambulation- I does use cane  Managing Meds- I  Follow up appointments reviewed:  PCP Hospital f/u appt confirmed? No  patient stated will call 05-24-21 to book follow up with [facility] f/u appt confirmed? Yes  Scheduled to see [name] on 05-24-2021 @ 1:30. Are transportation arrangements needed? Yes  If their condition worsens, is the pt aware to call PCP or go to the Emergency Dept.? Yes Was the patient provided with contact information for the PCP's office or ED? Yes Was to pt encouraged to call back with questions or concerns? Yes

## 2021-05-24 DIAGNOSIS — M79671 Pain in right foot: Secondary | ICD-10-CM | POA: Diagnosis not present

## 2021-05-24 DIAGNOSIS — I739 Peripheral vascular disease, unspecified: Secondary | ICD-10-CM | POA: Diagnosis not present

## 2021-05-24 DIAGNOSIS — E1152 Type 2 diabetes mellitus with diabetic peripheral angiopathy with gangrene: Secondary | ICD-10-CM | POA: Diagnosis not present

## 2021-05-26 ENCOUNTER — Other Ambulatory Visit: Payer: Self-pay

## 2021-05-26 ENCOUNTER — Telehealth: Payer: Self-pay

## 2021-05-26 DIAGNOSIS — I739 Peripheral vascular disease, unspecified: Secondary | ICD-10-CM

## 2021-05-26 NOTE — Telephone Encounter (Signed)
Patient calls today to report that 3 of her right toes have turned black and are exquisitely painful. She has some swelling in her foot and rest pain. This was present prior to her aortogram on 8/18 but she thinks it may have gotten worse. She has tibial and popliteal stents in that right leg. Placed her on schedule tomorrow for bil ABI + RLE art and follow up.

## 2021-05-27 ENCOUNTER — Other Ambulatory Visit: Payer: Self-pay

## 2021-05-27 ENCOUNTER — Ambulatory Visit: Payer: PPO | Admitting: Physician Assistant

## 2021-05-27 ENCOUNTER — Ambulatory Visit (INDEPENDENT_AMBULATORY_CARE_PROVIDER_SITE_OTHER)
Admission: RE | Admit: 2021-05-27 | Discharge: 2021-05-27 | Disposition: A | Discharge status: Home / Self Care | Payer: PPO | Source: Ambulatory Visit | Attending: Vascular Surgery | Admitting: Vascular Surgery

## 2021-05-27 ENCOUNTER — Ambulatory Visit (HOSPITAL_COMMUNITY)
Admission: RE | Admit: 2021-05-27 | Discharge: 2021-05-27 | Disposition: A | Discharge status: Home / Self Care | Payer: PPO | Source: Ambulatory Visit | Attending: Vascular Surgery | Admitting: Vascular Surgery

## 2021-05-27 VITALS — BP 156/80 | HR 79 | Temp 97.8°F | Resp 20 | Ht 64.0 in | Wt 139.7 lb

## 2021-05-27 DIAGNOSIS — I739 Peripheral vascular disease, unspecified: Secondary | ICD-10-CM

## 2021-05-27 NOTE — Progress Notes (Signed)
Office Note     CC:  follow up Requesting Provider:  [name], NP  HPI: Tammy Mcpherson is a 65 y.o. ([birth year]) adult who presents for follow up after recent Aortogram, RLE arteriogram with right popliteal angioplasty and stenting and right AT angioplasty by Dr. Stanford Breed on 05/19/21. This was performed secondary to rest pain and gangrene of right 2nd toe.   Her Arteriogram findings of the RLE were : Common femoral artery: patent without flow limiting stenosis  Profunda femoris artery: patent without flow limiting stenosis   Superficial femoral artery: diffusely diseased Popliteal artery: above knee diffusely diseased (>95% stenosis). Behind knee and below knee patent. Anterior tibial artery: chronic occlusion at the origin causing critical stenosis of terminal popliteal / proximal TP trunk. Patent beyond the occlusion. Tibioperoneal trunk: patent without flow limiting stenosis Peroneal artery: patent without flow limiting stenosis Posterior tibial artery: patent without flow limiting stenosis Pedal circulation: pedal arch intact  She was discharged on Aspirin, Statin and Plavix. She was scheduled for 6 week follow up . She has presented today for early follow up due to blackening of her 2nd toe and new wound on lateral aspect of right foot. She reports that since she was discharged on 8/19 the scab on dorsum of the right foot came off. She was just seen by Podiatrist who recommended she follow up with [place] as he was concerned about appearance of her toes. She says overall the leg feels better. She does not have any claudication symptoms any more. She is still having pain in the right forefoot and toes. Her foot has improved warmth. She denies any fever or chills  The pt is on a statin for cholesterol management.  The pt is on a daily aspirin.   Other AC:  Plavix The pt is on BB, ARB for hypertension.   The pt is diabetic.   Tobacco hx:  current  Past Medical History:  Diagnosis Date    Anemia 04/30/2016   Anxiety    Blood transfusion without reported diagnosis    Cardiomyopathy ([place])    a. EF 40-45% by echo in 04/2016 b. Improved to 60-65% by repeat imaging in 2018   CHF (congestive heart failure) (HCC)    COPD (chronic obstructive pulmonary disease) (HCC)    Coronary artery calcification seen on CT scan    Essential hypertension    GERD (gastroesophageal reflux disease)    History of bronchitis    History of GI bleed    Hyperlipidemia    Iron deficiency anemia 05/12/2016   Bleeding ulcers   Leukocytosis 03/23/2020   Pollen allergy    PSVT (paroxysmal supraventricular tachycardia) (HCC)    PVC's (premature ventricular contractions)    Thrombocytosis 03/23/2020   Vitamin B12 deficiency 03/23/2020    Past Surgical History:  Procedure Laterality Date   ABDOMINAL AORTOGRAM W/LOWER EXTREMITY Bilateral 05/19/2021   Procedure: ABDOMINAL AORTOGRAM W/LOWER EXTREMITY;  Surgeon: [name], MD;  Location: [facility];  Service: Cardiovascular;  Laterality: Bilateral;   ABDOMINAL HYSTERECTOMY     polyps  about age 64   AIKEN OSTEOTOMY Right 07/10/2013   Procedure: Barbie Banner OSTEOTOMY RIGHT FOOT;  Surgeon: Marcheta Grammes, DPM;  Location: [facility];  Service: Orthopedics;  Laterality: Right;   AMPUTATION Left 05/09/2017   Procedure: AMPUTATION 5TH TOE LEFT FOOT;  Surgeon: [name], DPM;  Location: [facility];  Service: Podiatry;  Laterality: Left;   AMPUTATION Left 06/13/2017   Procedure: AMPUTATION 2ND TOE LEFT FOOT;  Surgeon: [name], DPM;  Location: [facility];  Service: Podiatry;  Laterality: Left;   BUNIONECTOMY Right 07/10/2013   Procedure: VOGLER BUNIONECTOMY RIGHT FOOT;  Surgeon: Marcheta Grammes, DPM;  Location: [facility];  Service: Orthopedics;  Laterality: Right;   CHOLECYSTECTOMY N/A 08/22/2017   Procedure: LAPAROSCOPIC CHOLECYSTECTOMY;  Surgeon: Virl Cagey, MD;  Location: [facility];  Service: General;  Laterality: N/A;   COLONOSCOPY  N/A 07/07/2016   Dr. Oneida Alar; redundant left colon, diverticulosis at hepatic flexure, non-bleeding internal hemorrhoids   ESOPHAGOGASTRODUODENOSCOPY N/A 07/07/2016   Dr. Oneida Alar: many non-bleeding cratered gastric ulcers without stigmata of bleeding in gastric antrum. four 2-3 mm angioectasias without bleeding in duodenal bulb and second portion of duodenum s/p APC. Chroni gastritis on path.    ESOPHAGOGASTRODUODENOSCOPY N/A 08/03/2017   Dr. Oneida Alar: erosive gastritis, AVMs. Found a single non-bleeding angioectasia in stomach, s/p APC therapy. Four non-bleeding angioectasias in duodenum s/p APC. Non-bleeding erosive gastropathy   IR ANGIOGRAM EXTREMITY LEFT  04/24/2017   IR FEM POP ART ATHERECT INC PTA MOD SED  04/24/2017   IR INFUSION THROMBOL ARTERIAL INITIAL (MS)  04/24/2017   IR RADIOLOGIST EVAL & MGMT  12/05/2016   IR US GUIDE VASC ACCESS RIGHT  04/24/2017   LIVER BIOPSY N/A 08/22/2017   Procedure: LIVER BIOPSY;  Surgeon: Virl Cagey, MD;  Location: [facility];  Service: General;  Laterality: N/A;   METATARSAL HEAD EXCISION Right 07/10/2013   Procedure: METATARSAL HEAD RESECTION OF DIGITS 2 AND 3 RIGHT FOOT;  Surgeon: Marcheta Grammes, DPM;  Location: [facility];  Service: Orthopedics;  Laterality: Right;   PERIPHERAL VASCULAR INTERVENTION Right 05/19/2021   Procedure: PERIPHERAL VASCULAR INTERVENTION;  Surgeon: [name], MD;  Location: [facility];  Service: Cardiovascular;  Laterality: Right;   PROXIMAL INTERPHALANGEAL FUSION (PIP) Right 07/10/2013   Procedure: ARTHRODESIS PIPJ  2ND DIGIT RIGHT FOOT;  Surgeon: Marcheta Grammes, DPM;  Location: [facility];  Service: Orthopedics;  Laterality: Right;    Social History   Socioeconomic History   Marital status: Married    Spouse name: [name]   Number of children: 1   Years of education: 12   Highest education level: Not on file  Occupational History   Occupation: disabled    Comment: heart  Tobacco Use   Smoking status: Light  Smoker    Packs/day: 0.25    Years: 44.00    Pack years: 11.00    Types: Cigarettes    Start date: 05/10/1975   Smokeless tobacco: Never   Tobacco comments:    2 cigarettes per day 12/10/2019  Vaping Use   Vaping Use: Former  Substance and Sexual Activity   Alcohol use: Not Currently    Alcohol/week: 2.0 standard drinks    Types: 2 Glasses of wine per week   Drug use: No   Sexual activity: Yes    Birth control/protection: Surgical  Other Topics Concern   Not on file  Social History Narrative   Disabled   Lives with husband [name]   Two dogs   Social Determinants of Health   Financial Resource Strain: Not on file  Food Insecurity: Not on file  Transportation Needs: Not on file  Physical Activity: Not on file  Stress: Not on file  Social Connections: Not on file  Intimate Partner Violence: Not At Risk   Fear of Current or Ex-Partner: No   Emotionally Abused: No   Physically Abused: No   Sexually Abused: No   Family  History  Adopted: Yes  Problem Relation Age of Onset   Hypertension Father    Coronary artery disease Sister    Ulcers Maternal Grandmother    Colon cancer Neg Hx        unknown, was adopted    Current Outpatient Medications  Medication Sig Dispense Refill   acetaminophen (TYLENOL) 500 MG tablet Take 500 mg by mouth at bedtime as needed for moderate pain.     albuterol (PROVENTIL) (2.5 MG/3ML) 0.083% nebulizer solution Take 3 mLs (2.5 mg total) by nebulization every 4 (four) hours as needed for wheezing or shortness of breath. 75 mL 5   albuterol (VENTOLIN HFA) 108 (90 Base) MCG/ACT inhaler INHALE TWO PUFFS INTO THE LUNGS EVERY SIX HOURS AS NEEDED FOR WHEEZING OR SHORTNESS OF BREATH 8.5 g 5   ALPRAZolam (XANAX) 0.5 MG tablet TAKE 1 TABLET BY MOUTH AT BEDTIME AS NEEDED FOR ANXIETY (Patient taking differently: Take 0.5 mg by mouth at bedtime as needed for sleep or anxiety.) 30 tablet 2   aspirin 81 MG chewable tablet Chew 81 mg by mouth every morning.       atorvastatin (LIPITOR) 40 MG tablet Take 1 tablet (40 mg total) by mouth daily. 30 tablet 2   bisoprolol (ZEBETA) 10 MG tablet TAKE ONE (1) TABLET BY MOUTH EVERY DAY 90 tablet 3   blood glucose meter kit and supplies Dispense based on patient and insurance preference. Use up to four times daily as directed. (FOR ICD-10 E10.9, E11.9). 1 each 0   Budeson-Glycopyrrol-Formoterol (BREZTRI AEROSPHERE) 160-9-4.8 MCG/ACT AERO Inhale 2 puffs into the lungs 2 (two) times daily. 11.8 g 12   Cholecalciferol (VITAMIN D) 50 MCG (2000 UT) CAPS Take 1 capsule (2,000 Units total) by mouth daily. 90 capsule 4   clopidogrel (PLAVIX) 75 MG tablet Take 1 tablet (75 mg total) by mouth daily with breakfast. 30 tablet 0   Continuous Blood Gluc Receiver (FREESTYLE LIBRE 14 DAY READER) DEVI See admin instructions. 4 each 0   Continuous Blood Gluc Receiver (FREESTYLE LIBRE 2 READER) DEVI Check blood glucose up to 4 times daily 1 each 0   cyanocobalamin (,VITAMIN B-12,) 1000 MCG/ML injection INJECT 1 ML INTO THE MUSCLE EVERY 30 DAYS. (Patient taking differently: Inject 1,000 mcg into the muscle every 30 (thirty) days.) 1 mL 11   cyclobenzaprine (FLEXERIL) 5 MG tablet TAKE ONE TABLET BY MOUTH UP TO EVERY 8 HOURS AS NEEDED. START WITH ONE TABLET AT BEDTIME AS NEEDED DUE TO SEDATION. (Patient taking differently: Take 5 mg by mouth 3 (three) times daily as needed for muscle spasms.) 15 tablet 2   diclofenac (VOLTAREN) 75 MG EC tablet Take 1 tablet (75 mg total) by mouth 2 (two) times daily. 30 tablet 0   dicyclomine (BENTYL) 10 MG capsule Take 10 mg by mouth as needed for spasms.     empagliflozin (JARDIANCE) 10 MG TABS tablet Take 10 mg by mouth daily before breakfast. 30 tablet 4   FLUoxetine (PROZAC) 20 MG capsule Take 1 capsule (20 mg total) by mouth daily. 90 capsule 3   gabapentin (NEURONTIN) 300 MG capsule TAKE ONE CAPSULE (300 MG TOTAL) BY MOUTHTWO TIMES DAILY. 180 capsule 1   glucose blood (ONETOUCH ULTRA) test strip Use as  instructed 100 each 12   glucose monitoring kit (FREESTYLE) monitoring kit Use to check blood sugar BID as directed 1 each 0   lactase (LACTAID) 3000 units tablet Take by mouth as needed.     Lancets ([place]  LANCET33G) MISC 1 each by Does not apply route 2 (two) times a day. 100 each 3   losartan (COZAAR) 100 MG tablet TAKE ONE (1) TABLET BY MOUTH EVERY DAY 90 tablet 3   metFORMIN (GLUCOPHAGE) 1000 MG tablet Take 0.5 tablets (500 mg total) by mouth 2 (two) times daily with a meal. Takes 500 mg twice daily.  Total of 1000 mg daily. (Patient taking differently: Take 500 mg by mouth 2 (two) times daily with a meal. Takes 500 mg twice daily.  Total of 1000 mg daily.) 180 tablet 3   Misc. Devices MISC Please provide supplies (needle, syringe, alcohol swabs) needed for patient to self-administer B-12 injections monthly. 1 each 12   pantoprazole (PROTONIX) 40 MG tablet TAKE ONE (1) TABLET 30 MINS BEFORE YOUR FIRST MEAL. 90 tablet 3   spironolactone (ALDACTONE) 50 MG tablet Take 1 tablet (50 mg total) by mouth daily. 90 tablet 1   traMADol (ULTRAM) 50 MG tablet Take 50 mg by mouth every 6 (six) hours as needed.     ULTICARE TUBERCULIN SAFETY SYR 25G X 1" 1 ML MISC USE TO ADMINISTER INJECTABLE VITAMIN B12. 1 each 0   No current facility-administered medications for this visit.    Allergies  Allergen Reactions   Bee Venom Swelling   Codeine Anaphylaxis    Tongue swelled   Penicillins Swelling and Rash    Has patient had a PCN reaction causing immediate rash, facial/tongue/throat swelling, SOB or lightheadedness with hypotension: No, delayed Has patient had a PCN reaction causing severe rash involving mucus membranes or skin necrosis: No Has patient had a PCN reaction that required hospitalization No Has patient had a PCN reaction occurring within the last 10 years: No If all of the above answers are "NO", then may proceed with Cephalosporin use.    Bupropion Other (See Comments)     "Made my skin crawl"   Sudafed [Pseudoephedrine Hcl] Rash     REVIEW OF SYSTEMS:  '[X]'  denotes positive finding, '[ ]'  denotes negative finding Cardiac  Comments:  Chest pain or chest pressure:    Shortness of breath upon exertion:    Short of breath when lying flat:    Irregular heart rhythm:        Vascular    Pain in calf, thigh, or hip brought on by ambulation:    Pain in feet at night that wakes you up from your sleep:     Blood clot in your veins:    Leg swelling:         Pulmonary    Oxygen at home:    Productive cough:     Wheezing:         Neurologic    Sudden weakness in arms or legs:     Sudden numbness in arms or legs:     Sudden onset of difficulty speaking or slurred speech:    Temporary loss of vision in one eye:     Problems with dizziness:         Gastrointestinal    Blood in stool:     Vomited blood:         Genitourinary    Burning when urinating:     Blood in urine:        Psychiatric    Major depression:         Hematologic    Bleeding problems:    Problems with blood clotting too easily:        Skin  Rashes or ulcers:        Constitutional    Fever or chills:      PHYSICAL EXAMINATION:  Vitals:   05/27/21 1053  BP: (!) 156/80  Pulse: 79  Resp: 20  Temp: 97.8 F (36.6 C)  TempSrc: Temporal  SpO2: 97%  Weight: 139 lb 11.2 oz (63.4 kg)  Height: '5\' 4"'  (1.626 m)    General:  WDWN in NAD; vital signs documented above Gait: Normal HENT: WNL, normocephalic Pulmonary: normal non-labored breathing , without wheezing Cardiac: regular HR, without  Murmurs  Abdomen: soft, NT, no masses Vascular Exam/Pulses:  Right Left  Radial 2+ (normal) 2+ (normal)  Femoral 2+ (normal) 2+ (normal)  Popliteal Not normal Not normal  DP Not palpable 2+ (normal)  PT 1+ (weak) 1+ (weak)   Extremities: with ischemic changes of right 2nd toe, with Gangrene , without cellulitis; with open wound of dorsum of right 1st toe over MTP, right 2nd toe and  ulceration over base of 5th metatarsal      Musculoskeletal: no muscle wasting or atrophy  Neurologic: A&O X 3;  No focal weakness or paresthesias are detected Psychiatric:  The pt has Normal affect.   Non-Invasive Vascular Imaging:   +----------+--------+-----+--------+----------+--------+  RIGHT     PSV cm/sRatioStenosisWaveform  Comments  +----------+--------+-----+--------+----------+--------+  CFA [id number]                  triphasic           +----------+--------+-----+--------+----------+--------+  DFA       69                   triphasic           +----------+--------+-----+--------+----------+--------+  SFA Prox  218                  biphasic            +----------+--------+-----+--------+----------+--------+  SFA Mid   130                  biphasic            +----------+--------+-----+--------+----------+--------+  SFA Distal173                  biphasic            +----------+--------+-----+--------+----------+--------+  ATA Distal94                   monophasic          +----------+--------+-----+--------+----------+--------+     Right Stent(s):  +----------------+--------+--------+---------+--------+  Distal SFA / PopPSV cm/sStenosisWaveform Comments  +----------------+--------+--------+---------+--------+  Prox to Stent   171             triphasic          +----------------+--------+--------+---------+--------+  Proximal Stent  166             biphasic           +----------------+--------+--------+---------+--------+  Mid Stent       68              biphasic           +----------------+--------+--------+---------+--------+  Distal Stent    57              biphasic           +----------------+--------+--------+---------+--------+  Distal to Stent 85              triphasic          +----------------+--------+--------+---------+--------+  Summary:  Right: Patent stent with no  evidence of stenosis in the Distal SFA/ Pop artery.   +-------+-----------+-----------+------------+------------+  ABI/TBIToday's ABIToday's TBIPrevious ABIPrevious TBI  +-------+-----------+-----------+------------+------------+  Right  0.98       0.75                                 +-------+-----------+-----------+------------+------------+  Left   0.90       0.58                                 +-------+-----------+-----------+------------+------------+    ASSESSMENT/PLAN:: 65 y.o. adult here for follow up of right foot wounds. These and progressed since discharge. She has gangrene of her right 2nd toe with rest pain. Her Duplex today shows adequate perfusion following recent revascularization. Her ABI/ TBI should be adequate to heal a 2nd toe amputation - continue local wound care. Okay to use betadine on the toes. I also recommend weaving dry gauze between toes - continue heel weight bearing only - discussed importance of good blood sugar control, smoking cessation and compliance with her Aspirin, statin and Plavix -Will schedule her for a 2nd toe amputation and earliest next availability with Dr. Scot Dock Dr. Stanford Breed for next week. This can likely be done as an ambulatory surgery   Karoline Caldwell, PA-C [facility] [phone]  Clinic MD:  Donzetta Matters

## 2021-05-27 NOTE — H&P (View-Only) (Signed)
Office Note     CC:  follow up Requesting Provider:  [name], NP  HPI: Tammy Mcpherson is a 65 y.o. (30-Nov-1955) adult who presents for follow up after recent Aortogram, RLE arteriogram with right popliteal angioplasty and stenting and right AT angioplasty by Dr. Stanford Breed on 05/19/21. This was performed secondary to rest pain and gangrene of right 2nd toe.   Her Arteriogram findings of the RLE were : Common femoral artery: patent without flow limiting stenosis  Profunda femoris artery: patent without flow limiting stenosis   Superficial femoral artery: diffusely diseased Popliteal artery: above knee diffusely diseased (>95% stenosis). Behind knee and below knee patent. Anterior tibial artery: chronic occlusion at the origin causing critical stenosis of terminal popliteal / proximal TP trunk. Patent beyond the occlusion. Tibioperoneal trunk: patent without flow limiting stenosis Peroneal artery: patent without flow limiting stenosis Posterior tibial artery: patent without flow limiting stenosis Pedal circulation: pedal arch intact  She was discharged on Aspirin, Statin and Plavix. She was scheduled for 6 week follow up . She has presented today for early follow up due to blackening of her 2nd toe and new wound on lateral aspect of right foot. She reports that since she was discharged on 8/19 the scab on dorsum of the right foot came off. She was just seen by Podiatrist who recommended she follow up with [place] as he was concerned about appearance of her toes. She says overall the leg feels better. She does not have any claudication symptoms any more. She is still having pain in the right forefoot and toes. Her foot has improved warmth. She denies any fever or chills  The pt is on a statin for cholesterol management.  The pt is on a daily aspirin.   Other AC:  Plavix The pt is on BB, ARB for hypertension.   The pt is diabetic.   Tobacco hx:  current  Past Medical History:  Diagnosis Date    Anemia 04/30/2016   Anxiety    Blood transfusion without reported diagnosis    Cardiomyopathy ([place])    a. EF 40-45% by echo in 04/2016 b. Improved to 60-65% by repeat imaging in 2018   CHF (congestive heart failure) (HCC)    COPD (chronic obstructive pulmonary disease) (HCC)    Coronary artery calcification seen on CT scan    Essential hypertension    GERD (gastroesophageal reflux disease)    History of bronchitis    History of GI bleed    Hyperlipidemia    Iron deficiency anemia 05/12/2016   Bleeding ulcers   Leukocytosis 03/23/2020   Pollen allergy    PSVT (paroxysmal supraventricular tachycardia) (HCC)    PVC's (premature ventricular contractions)    Thrombocytosis 03/23/2020   Vitamin B12 deficiency 03/23/2020    Past Surgical History:  Procedure Laterality Date   ABDOMINAL AORTOGRAM W/LOWER EXTREMITY Bilateral 05/19/2021   Procedure: ABDOMINAL AORTOGRAM W/LOWER EXTREMITY;  Surgeon: [name], MD;  Location: [facility];  Service: Cardiovascular;  Laterality: Bilateral;   ABDOMINAL HYSTERECTOMY     polyps  about age 53   AIKEN OSTEOTOMY Right 07/10/2013   Procedure: Barbie Banner OSTEOTOMY RIGHT FOOT;  Surgeon: Marcheta Grammes, DPM;  Location: [facility];  Service: Orthopedics;  Laterality: Right;   AMPUTATION Left 05/09/2017   Procedure: AMPUTATION 5TH TOE LEFT FOOT;  Surgeon: [name], DPM;  Location: [facility];  Service: Podiatry;  Laterality: Left;   AMPUTATION Left 06/13/2017   Procedure: AMPUTATION 2ND TOE LEFT FOOT;  Surgeon: [name], DPM;  Location: [facility];  Service: Podiatry;  Laterality: Left;   BUNIONECTOMY Right 07/10/2013   Procedure: VOGLER BUNIONECTOMY RIGHT FOOT;  Surgeon: Marcheta Grammes, DPM;  Location: [facility];  Service: Orthopedics;  Laterality: Right;   CHOLECYSTECTOMY N/A 08/22/2017   Procedure: LAPAROSCOPIC CHOLECYSTECTOMY;  Surgeon: Virl Cagey, MD;  Location: [facility];  Service: General;  Laterality: N/A;   COLONOSCOPY  N/A 07/07/2016   Dr. Oneida Alar; redundant left colon, diverticulosis at hepatic flexure, non-bleeding internal hemorrhoids   ESOPHAGOGASTRODUODENOSCOPY N/A 07/07/2016   Dr. Oneida Alar: many non-bleeding cratered gastric ulcers without stigmata of bleeding in gastric antrum. four 2-3 mm angioectasias without bleeding in duodenal bulb and second portion of duodenum s/p APC. Chroni gastritis on path.    ESOPHAGOGASTRODUODENOSCOPY N/A 08/03/2017   Dr. Oneida Alar: erosive gastritis, AVMs. Found a single non-bleeding angioectasia in stomach, s/p APC therapy. Four non-bleeding angioectasias in duodenum s/p APC. Non-bleeding erosive gastropathy   IR ANGIOGRAM EXTREMITY LEFT  04/24/2017   IR FEM POP ART ATHERECT INC PTA MOD SED  04/24/2017   IR INFUSION THROMBOL ARTERIAL INITIAL (MS)  04/24/2017   IR RADIOLOGIST EVAL & MGMT  12/05/2016   IR US GUIDE VASC ACCESS RIGHT  04/24/2017   LIVER BIOPSY N/A 08/22/2017   Procedure: LIVER BIOPSY;  Surgeon: Virl Cagey, MD;  Location: [facility];  Service: General;  Laterality: N/A;   METATARSAL HEAD EXCISION Right 07/10/2013   Procedure: METATARSAL HEAD RESECTION OF DIGITS 2 AND 3 RIGHT FOOT;  Surgeon: Marcheta Grammes, DPM;  Location: [facility];  Service: Orthopedics;  Laterality: Right;   PERIPHERAL VASCULAR INTERVENTION Right 05/19/2021   Procedure: PERIPHERAL VASCULAR INTERVENTION;  Surgeon: [name], MD;  Location: [facility];  Service: Cardiovascular;  Laterality: Right;   PROXIMAL INTERPHALANGEAL FUSION (PIP) Right 07/10/2013   Procedure: ARTHRODESIS PIPJ  2ND DIGIT RIGHT FOOT;  Surgeon: Marcheta Grammes, DPM;  Location: [facility];  Service: Orthopedics;  Laterality: Right;    Social History   Socioeconomic History   Marital status: Married    Spouse name: [name]   Number of children: 1   Years of education: 12   Highest education level: Not on file  Occupational History   Occupation: disabled    Comment: heart  Tobacco Use   Smoking status: Light  Smoker    Packs/day: 0.25    Years: 44.00    Pack years: 11.00    Types: Cigarettes    Start date: 05/10/1975   Smokeless tobacco: Never   Tobacco comments:    2 cigarettes per day 12/10/2019  Vaping Use   Vaping Use: Former  Substance and Sexual Activity   Alcohol use: Not Currently    Alcohol/week: 2.0 standard drinks    Types: 2 Glasses of wine per week   Drug use: No   Sexual activity: Yes    Birth control/protection: Surgical  Other Topics Concern   Not on file  Social History Narrative   Disabled   Lives with husband [name]   Two dogs   Social Determinants of Health   Financial Resource Strain: Not on file  Food Insecurity: Not on file  Transportation Needs: Not on file  Physical Activity: Not on file  Stress: Not on file  Social Connections: Not on file  Intimate Partner Violence: Not At Risk   Fear of Current or Ex-Partner: No   Emotionally Abused: No   Physically Abused: No   Sexually Abused: No   Family  History  Adopted: Yes  Problem Relation Age of Onset   Hypertension Father    Coronary artery disease Sister    Ulcers Maternal Grandmother    Colon cancer Neg Hx        unknown, was adopted    Current Outpatient Medications  Medication Sig Dispense Refill   acetaminophen (TYLENOL) 500 MG tablet Take 500 mg by mouth at bedtime as needed for moderate pain.     albuterol (PROVENTIL) (2.5 MG/3ML) 0.083% nebulizer solution Take 3 mLs (2.5 mg total) by nebulization every 4 (four) hours as needed for wheezing or shortness of breath. 75 mL 5   albuterol (VENTOLIN HFA) 108 (90 Base) MCG/ACT inhaler INHALE TWO PUFFS INTO THE LUNGS EVERY SIX HOURS AS NEEDED FOR WHEEZING OR SHORTNESS OF BREATH 8.5 g 5   ALPRAZolam (XANAX) 0.5 MG tablet TAKE 1 TABLET BY MOUTH AT BEDTIME AS NEEDED FOR ANXIETY (Patient taking differently: Take 0.5 mg by mouth at bedtime as needed for sleep or anxiety.) 30 tablet 2   aspirin 81 MG chewable tablet Chew 81 mg by mouth every morning.       atorvastatin (LIPITOR) 40 MG tablet Take 1 tablet (40 mg total) by mouth daily. 30 tablet 2   bisoprolol (ZEBETA) 10 MG tablet TAKE ONE (1) TABLET BY MOUTH EVERY DAY 90 tablet 3   blood glucose meter kit and supplies Dispense based on patient and insurance preference. Use up to four times daily as directed. (FOR ICD-10 E10.9, E11.9). 1 each 0   Budeson-Glycopyrrol-Formoterol (BREZTRI AEROSPHERE) 160-9-4.8 MCG/ACT AERO Inhale 2 puffs into the lungs 2 (two) times daily. 11.8 g 12   Cholecalciferol (VITAMIN D) 50 MCG (2000 UT) CAPS Take 1 capsule (2,000 Units total) by mouth daily. 90 capsule 4   clopidogrel (PLAVIX) 75 MG tablet Take 1 tablet (75 mg total) by mouth daily with breakfast. 30 tablet 0   Continuous Blood Gluc Receiver (FREESTYLE LIBRE 14 DAY READER) DEVI See admin instructions. 4 each 0   Continuous Blood Gluc Receiver (FREESTYLE LIBRE 2 READER) DEVI Check blood glucose up to 4 times daily 1 each 0   cyanocobalamin (,VITAMIN B-12,) 1000 MCG/ML injection INJECT 1 ML INTO THE MUSCLE EVERY 30 DAYS. (Patient taking differently: Inject 1,000 mcg into the muscle every 30 (thirty) days.) 1 mL 11   cyclobenzaprine (FLEXERIL) 5 MG tablet TAKE ONE TABLET BY MOUTH UP TO EVERY 8 HOURS AS NEEDED. START WITH ONE TABLET AT BEDTIME AS NEEDED DUE TO SEDATION. (Patient taking differently: Take 5 mg by mouth 3 (three) times daily as needed for muscle spasms.) 15 tablet 2   diclofenac (VOLTAREN) 75 MG EC tablet Take 1 tablet (75 mg total) by mouth 2 (two) times daily. 30 tablet 0   dicyclomine (BENTYL) 10 MG capsule Take 10 mg by mouth as needed for spasms.     empagliflozin (JARDIANCE) 10 MG TABS tablet Take 10 mg by mouth daily before breakfast. 30 tablet 4   FLUoxetine (PROZAC) 20 MG capsule Take 1 capsule (20 mg total) by mouth daily. 90 capsule 3   gabapentin (NEURONTIN) 300 MG capsule TAKE ONE CAPSULE (300 MG TOTAL) BY MOUTHTWO TIMES DAILY. 180 capsule 1   glucose blood (ONETOUCH ULTRA) test strip Use as  instructed 100 each 12   glucose monitoring kit (FREESTYLE) monitoring kit Use to check blood sugar BID as directed 1 each 0   lactase (LACTAID) 3000 units tablet Take by mouth as needed.     Lancets ([place]  LANCET33G) MISC 1 each by Does not apply route 2 (two) times a day. 100 each 3   losartan (COZAAR) 100 MG tablet TAKE ONE (1) TABLET BY MOUTH EVERY DAY 90 tablet 3   metFORMIN (GLUCOPHAGE) 1000 MG tablet Take 0.5 tablets (500 mg total) by mouth 2 (two) times daily with a meal. Takes 500 mg twice daily.  Total of 1000 mg daily. (Patient taking differently: Take 500 mg by mouth 2 (two) times daily with a meal. Takes 500 mg twice daily.  Total of 1000 mg daily.) 180 tablet 3   Misc. Devices MISC Please provide supplies (needle, syringe, alcohol swabs) needed for patient to self-administer B-12 injections monthly. 1 each 12   pantoprazole (PROTONIX) 40 MG tablet TAKE ONE (1) TABLET 30 MINS BEFORE YOUR FIRST MEAL. 90 tablet 3   spironolactone (ALDACTONE) 50 MG tablet Take 1 tablet (50 mg total) by mouth daily. 90 tablet 1   traMADol (ULTRAM) 50 MG tablet Take 50 mg by mouth every 6 (six) hours as needed.     ULTICARE TUBERCULIN SAFETY SYR 25G X 1" 1 ML MISC USE TO ADMINISTER INJECTABLE VITAMIN B12. 1 each 0   No current facility-administered medications for this visit.    Allergies  Allergen Reactions   Bee Venom Swelling   Codeine Anaphylaxis    Tongue swelled   Penicillins Swelling and Rash    Has patient had a PCN reaction causing immediate rash, facial/tongue/throat swelling, SOB or lightheadedness with hypotension: No, delayed Has patient had a PCN reaction causing severe rash involving mucus membranes or skin necrosis: No Has patient had a PCN reaction that required hospitalization No Has patient had a PCN reaction occurring within the last 10 years: No If all of the above answers are "NO", then may proceed with Cephalosporin use.    Bupropion Other (See Comments)     "Made my skin crawl"   Sudafed [Pseudoephedrine Hcl] Rash     REVIEW OF SYSTEMS:  '[X]'  denotes positive finding, '[ ]'  denotes negative finding Cardiac  Comments:  Chest pain or chest pressure:    Shortness of breath upon exertion:    Short of breath when lying flat:    Irregular heart rhythm:        Vascular    Pain in calf, thigh, or hip brought on by ambulation:    Pain in feet at night that wakes you up from your sleep:     Blood clot in your veins:    Leg swelling:         Pulmonary    Oxygen at home:    Productive cough:     Wheezing:         Neurologic    Sudden weakness in arms or legs:     Sudden numbness in arms or legs:     Sudden onset of difficulty speaking or slurred speech:    Temporary loss of vision in one eye:     Problems with dizziness:         Gastrointestinal    Blood in stool:     Vomited blood:         Genitourinary    Burning when urinating:     Blood in urine:        Psychiatric    Major depression:         Hematologic    Bleeding problems:    Problems with blood clotting too easily:        Skin  Rashes or ulcers:        Constitutional    Fever or chills:      PHYSICAL EXAMINATION:  Vitals:   05/27/21 1053  BP: (!) 156/80  Pulse: 79  Resp: 20  Temp: 97.8 F (36.6 C)  TempSrc: Temporal  SpO2: 97%  Weight: 139 lb 11.2 oz (63.4 kg)  Height: '5\' 4"'  (1.626 m)    General:  WDWN in NAD; vital signs documented above Gait: Normal HENT: WNL, normocephalic Pulmonary: normal non-labored breathing , without wheezing Cardiac: regular HR, without  Murmurs  Abdomen: soft, NT, no masses Vascular Exam/Pulses:  Right Left  Radial 2+ (normal) 2+ (normal)  Femoral 2+ (normal) 2+ (normal)  Popliteal Not normal Not normal  DP Not palpable 2+ (normal)  PT 1+ (weak) 1+ (weak)   Extremities: with ischemic changes of right 2nd toe, with Gangrene , without cellulitis; with open wound of dorsum of right 1st toe over MTP, right 2nd toe and  ulceration over base of 5th metatarsal      Musculoskeletal: no muscle wasting or atrophy  Neurologic: A&O X 3;  No focal weakness or paresthesias are detected Psychiatric:  The pt has Normal affect.   Non-Invasive Vascular Imaging:   +----------+--------+-----+--------+----------+--------+  RIGHT     PSV cm/sRatioStenosisWaveform  Comments  +----------+--------+-----+--------+----------+--------+  CFA Distal187                  triphasic           +----------+--------+-----+--------+----------+--------+  DFA       69                   triphasic           +----------+--------+-----+--------+----------+--------+  SFA Prox  218                  biphasic            +----------+--------+-----+--------+----------+--------+  SFA Mid   130                  biphasic            +----------+--------+-----+--------+----------+--------+  SFA Distal173                  biphasic            +----------+--------+-----+--------+----------+--------+  ATA Distal94                   monophasic          +----------+--------+-----+--------+----------+--------+     Right Stent(s):  +----------------+--------+--------+---------+--------+  Distal SFA / PopPSV cm/sStenosisWaveform Comments  +----------------+--------+--------+---------+--------+  Prox to Stent   171             triphasic          +----------------+--------+--------+---------+--------+  Proximal Stent  166             biphasic           +----------------+--------+--------+---------+--------+  Mid Stent       68              biphasic           +----------------+--------+--------+---------+--------+  Distal Stent    57              biphasic           +----------------+--------+--------+---------+--------+  Distal to Stent 85              triphasic          +----------------+--------+--------+---------+--------+  Summary:  Right: Patent stent with no  evidence of stenosis in the Distal SFA/ Pop artery.   +-------+-----------+-----------+------------+------------+  ABI/TBIToday's ABIToday's TBIPrevious ABIPrevious TBI  +-------+-----------+-----------+------------+------------+  Right  0.98       0.75                                 +-------+-----------+-----------+------------+------------+  Left   0.90       0.58                                 +-------+-----------+-----------+------------+------------+    ASSESSMENT/PLAN:: 65 y.o. adult here for follow up of right foot wounds. These and progressed since discharge. She has gangrene of her right 2nd toe with rest pain. Her Duplex today shows adequate perfusion following recent revascularization. Her ABI/ TBI should be adequate to heal a 2nd toe amputation - continue local wound care. Okay to use betadine on the toes. I also recommend weaving dry gauze between toes - continue heel weight bearing only - discussed importance of good blood sugar control, smoking cessation and compliance with her Aspirin, statin and Plavix -Will schedule her for a 2nd toe amputation and earliest next availability with Dr. Scot Dock Dr. Stanford Breed for next week. This can likely be done as an ambulatory surgery   Karoline Caldwell, PA-C [facility] [phone]  Clinic MD:  Donzetta Matters

## 2021-05-30 ENCOUNTER — Other Ambulatory Visit: Payer: Self-pay

## 2021-05-31 ENCOUNTER — Other Ambulatory Visit: Payer: Self-pay

## 2021-05-31 ENCOUNTER — Encounter (HOSPITAL_COMMUNITY): Payer: Self-pay | Admitting: Vascular Surgery

## 2021-05-31 ENCOUNTER — Telehealth: Payer: Self-pay

## 2021-05-31 ENCOUNTER — Other Ambulatory Visit: Payer: Self-pay | Admitting: Registered Nurse

## 2021-05-31 NOTE — Progress Notes (Signed)
Spoke with pt. She is going to contact Dr.Hawken's office today regarding her Plavix. She states she only started taking it one week ago and has not yet taken it today 05/31/2021.  Pt instructed 06/01/2021 am not to take Jardiance or Metformin and to check blood sugar as soon as she awakens in the am.

## 2021-05-31 NOTE — Progress Notes (Signed)
Anesthesia Chart Review: Same day workup  Follows with cardiology for history of nonischemic cardiomyopathy. Echo 2019 showed LVEF normal range 65 to 70%.  Most recent follow-up echo 10/06/2020 showed maintenance of normal LVEF, 60 to 65%.  Continuation of current medical therapy recommended.  Follows with pulmonology for history of COPD.  Current smoker, 1 pack/day.  Last seen by Dr. [name] 11/15/2020.  Per note, "65 year old female COPD currently on triple therapy inhaler.  She overall is doing well with this.  Currently enrolled in our lung cancer screening program repeat CT planned for March 2022."  History of DM2, last A1c 7.2 on 05/17/2021.  CBC from 05/19/2021 reviewed, mildly elevated WBC 13.9, otherwise unremarkable.  Patient will need day of surgery evaluation.  EKG 03/25/2020: Sinus  Rhythm.  Rate 77. Old anteroseptal infarct.  Nonspecific ST depression. Low voltage -possible pulmonary disease.   TTE 10/06/2020:  1. Left ventricular ejection fraction, by estimation, is 60 to 65%. The  left ventricle has normal function. The left ventricle has no regional  wall motion abnormalities. Left ventricular diastolic parameters are  indeterminate.   2. Right ventricular systolic function is normal. The right ventricular  size is normal.   3. Left atrial size was moderately dilated.   4. The mitral valve is normal in structure. No evidence of mitral valve  regurgitation. No evidence of mitral stenosis.   5. The aortic valve is tricuspid. There is mild calcification of the  aortic valve. There is mild thickening of the aortic valve. Aortic valve  regurgitation is not visualized. No aortic stenosis is present.   6. The inferior vena cava is normal in size with greater than 50%  respiratory variability, suggesting right atrial pressure of 3 mmHg.   Comparison(s): Echocardiogram done 03/20/18 showed an EF of 65%.   Nuclear stress 05/19/2016: Upsloping ST segment depression ST segment depression  was noted during stress in the II, III, aVF, V4, V5 and V6 leads. Overall nonspecific EKG changes. The left ventricular ejection fraction is severely decreased (<30%). The study is normal. There are no perfusion defects consistent with prior infarct or current ischemia This is a high risk study. High risk based on decreased LVEF. There is no myocardium currently at jeopardy.    Wynonia Musty [facility]/Anesthesiology Phone [phone] 05/31/2021 11:31 AM

## 2021-05-31 NOTE — Telephone Encounter (Signed)
Patient is scheduled for a toe amputation tomorrow and I called her based on a pt advice request message she sent regarding meds to take /and worsening toe discoloration. Advised patient she could continue to take Plavix and aspirin based on recent popliteal/tibial interventions and to only hold the metformin tomorrow morning. We discussed the procedure, and I advised patient that the surgeon would see her in pre-op prior to the surgery. Assured patient that her toes/foot would be assessed in pre-op as well as in the OR and the amputation of toes/etc would be based on what would heal/what needed to be done. Patient states "already lost 2 toes, nothing new to me." She has no further questions at this time.

## 2021-05-31 NOTE — Anesthesia Preprocedure Evaluation (Addendum)
Anesthesia Evaluation  Patient identified by MRN, date of birth, ID band Patient awake    Reviewed: Allergy & Precautions, NPO status , Patient's Chart, lab work & pertinent test results  Airway Mallampati: III  TM Distance: >3 FB Neck ROM: Full    Dental  (+) Edentulous Upper, Edentulous Lower   Pulmonary COPD,  COPD inhaler, Current Smoker and Patient abstained from smoking.,    Pulmonary exam normal breath sounds clear to auscultation       Cardiovascular hypertension, Pt. on home beta blockers and Pt. on medications + CAD, + Peripheral Vascular Disease and +CHF  Normal cardiovascular exam Rhythm:Regular Rate:Normal  ECG: NSR, rate 81   Neuro/Psych  Headaches, Anxiety    GI/Hepatic Neg liver ROS, hiatal hernia, PUD, GERD  Medicated and Controlled,  Endo/Other  diabetes  Renal/GU negative Renal ROS     Musculoskeletal  (+) Arthritis ,   Abdominal   Peds  Hematology HLD   Anesthesia Other Findings gangrene right second toe  Reproductive/Obstetrics                           Anesthesia Physical Anesthesia Plan  ASA: 3  Anesthesia Plan: Regional   Post-op Pain Management:    Induction: Intravenous  PONV Risk Score and Plan: 1 and Ondansetron, Dexamethasone, Midazolam and Treatment may vary due to age or medical condition  Airway Management Planned: Simple Face Mask  Additional Equipment:   Intra-op Plan:   Post-operative Plan:   Informed Consent: I have reviewed the patients History and Physical, chart, labs and discussed the procedure including the risks, benefits and alternatives for the proposed anesthesia with the patient or authorized representative who has indicated his/her understanding and acceptance.       Plan Discussed with: CRNA and Surgeon  Anesthesia Plan Comments: (PAT note by Karoline Caldwell, PA-C: Follows with cardiology for history of nonischemic  cardiomyopathy. Echo 2019 showed LVEF normal range 65 to 70%.  Most recent follow-up echo 10/06/2020 showed maintenance of normal LVEF, 60 to 65%.  Continuation of current medical therapy recommended.  Follows with pulmonology for history of COPD.  Current smoker, 1 pack/day.  Last seen by Dr. [name] 11/15/2020.  Per note, "65 year old female COPD currently on triple therapy inhaler. She overall is doing well with this. Currently enrolled in our lung cancer screening program repeat CT planned for March 2022."  History of DM2, last A1c 7.2 on 05/17/2021.  CBC from 05/19/2021 reviewed, mildly elevated WBC 13.9, otherwise unremarkable.  Patient will need day of surgery evaluation.  EKG 03/25/2020: Sinus  Rhythm.  Rate 77. Old anteroseptal infarct.  Nonspecific ST depression. Low voltage -possible pulmonary disease.   TTE 10/06/2020: 1. Left ventricular ejection fraction, by estimation, is 60 to 65%. The  left ventricle has normal function. The left ventricle has no regional  wall motion abnormalities. Left ventricular diastolic parameters are  indeterminate.  2. Right ventricular systolic function is normal. The right ventricular  size is normal.  3. Left atrial size was moderately dilated.  4. The mitral valve is normal in structure. No evidence of mitral valve  regurgitation. No evidence of mitral stenosis.  5. The aortic valve is tricuspid. There is mild calcification of the  aortic valve. There is mild thickening of the aortic valve. Aortic valve  regurgitation is not visualized. No aortic stenosis is present.  6. The inferior vena cava is normal in size with greater than 50%  respiratory variability, suggesting  right atrial pressure of 3 mmHg.   Comparison(s): Echocardiogram done 03/20/18 showed an EF of 65%.   Nuclear stress 05/19/2016: . Upsloping ST segment depression ST segment depression was noted during stress in the II, III, aVF, V4, V5 and V6 leads. Overall nonspecific EKG  changes. . The left ventricular ejection fraction is severely decreased (<30%). . The study is normal. There are no perfusion defects consistent with prior infarct or current ischemia . This is a high risk study. High risk based on decreased LVEF. There is no myocardium currently at jeopardy.  )      Anesthesia Quick Evaluation

## 2021-06-01 ENCOUNTER — Ambulatory Visit (HOSPITAL_COMMUNITY): Payer: PPO | Admitting: Physician Assistant

## 2021-06-01 ENCOUNTER — Encounter (HOSPITAL_COMMUNITY)
Admission: RE | Disposition: A | Discharge status: Home / Self Care | Payer: Self-pay | Source: Home / Self Care | Attending: Vascular Surgery

## 2021-06-01 ENCOUNTER — Ambulatory Visit (HOSPITAL_COMMUNITY)
Admission: RE | Admit: 2021-06-01 | Discharge: 2021-06-01 | Disposition: A | Discharge status: Home / Self Care | Payer: PPO | Attending: Vascular Surgery | Admitting: Vascular Surgery

## 2021-06-01 ENCOUNTER — Other Ambulatory Visit: Payer: Self-pay

## 2021-06-01 ENCOUNTER — Telehealth: Payer: Self-pay

## 2021-06-01 ENCOUNTER — Encounter (HOSPITAL_COMMUNITY): Payer: Self-pay | Admitting: Vascular Surgery

## 2021-06-01 DIAGNOSIS — I509 Heart failure, unspecified: Secondary | ICD-10-CM | POA: Insufficient documentation

## 2021-06-01 DIAGNOSIS — Z90710 Acquired absence of both cervix and uterus: Secondary | ICD-10-CM | POA: Insufficient documentation

## 2021-06-01 DIAGNOSIS — Z880 Allergy status to penicillin: Secondary | ICD-10-CM | POA: Insufficient documentation

## 2021-06-01 DIAGNOSIS — Z7902 Long term (current) use of antithrombotics/antiplatelets: Secondary | ICD-10-CM | POA: Insufficient documentation

## 2021-06-01 DIAGNOSIS — E785 Hyperlipidemia, unspecified: Secondary | ICD-10-CM | POA: Insufficient documentation

## 2021-06-01 DIAGNOSIS — Z8711 Personal history of peptic ulcer disease: Secondary | ICD-10-CM | POA: Insufficient documentation

## 2021-06-01 DIAGNOSIS — I96 Gangrene, not elsewhere classified: Secondary | ICD-10-CM | POA: Diagnosis not present

## 2021-06-01 DIAGNOSIS — Z7984 Long term (current) use of oral hypoglycemic drugs: Secondary | ICD-10-CM | POA: Diagnosis not present

## 2021-06-01 DIAGNOSIS — Z9049 Acquired absence of other specified parts of digestive tract: Secondary | ICD-10-CM | POA: Diagnosis not present

## 2021-06-01 DIAGNOSIS — E1165 Type 2 diabetes mellitus with hyperglycemia: Secondary | ICD-10-CM | POA: Diagnosis not present

## 2021-06-01 DIAGNOSIS — Z885 Allergy status to narcotic agent status: Secondary | ICD-10-CM | POA: Insufficient documentation

## 2021-06-01 DIAGNOSIS — E559 Vitamin D deficiency, unspecified: Secondary | ICD-10-CM | POA: Diagnosis not present

## 2021-06-01 DIAGNOSIS — Z8379 Family history of other diseases of the digestive system: Secondary | ICD-10-CM | POA: Insufficient documentation

## 2021-06-01 DIAGNOSIS — E1152 Type 2 diabetes mellitus with diabetic peripheral angiopathy with gangrene: Secondary | ICD-10-CM | POA: Insufficient documentation

## 2021-06-01 DIAGNOSIS — I110 Hypertensive heart disease with heart failure: Secondary | ICD-10-CM | POA: Insufficient documentation

## 2021-06-01 DIAGNOSIS — I70261 Atherosclerosis of native arteries of extremities with gangrene, right leg: Secondary | ICD-10-CM | POA: Insufficient documentation

## 2021-06-01 DIAGNOSIS — I739 Peripheral vascular disease, unspecified: Secondary | ICD-10-CM

## 2021-06-01 DIAGNOSIS — Z79899 Other long term (current) drug therapy: Secondary | ICD-10-CM | POA: Insufficient documentation

## 2021-06-01 DIAGNOSIS — I429 Cardiomyopathy, unspecified: Secondary | ICD-10-CM | POA: Diagnosis not present

## 2021-06-01 DIAGNOSIS — J449 Chronic obstructive pulmonary disease, unspecified: Secondary | ICD-10-CM | POA: Diagnosis not present

## 2021-06-01 DIAGNOSIS — Z8719 Personal history of other diseases of the digestive system: Secondary | ICD-10-CM | POA: Insufficient documentation

## 2021-06-01 DIAGNOSIS — Z8249 Family history of ischemic heart disease and other diseases of the circulatory system: Secondary | ICD-10-CM | POA: Diagnosis not present

## 2021-06-01 DIAGNOSIS — Z7951 Long term (current) use of inhaled steroids: Secondary | ICD-10-CM | POA: Diagnosis not present

## 2021-06-01 DIAGNOSIS — F17210 Nicotine dependence, cigarettes, uncomplicated: Secondary | ICD-10-CM | POA: Insufficient documentation

## 2021-06-01 DIAGNOSIS — Z888 Allergy status to other drugs, medicaments and biological substances status: Secondary | ICD-10-CM | POA: Insufficient documentation

## 2021-06-01 DIAGNOSIS — Z89422 Acquired absence of other left toe(s): Secondary | ICD-10-CM | POA: Insufficient documentation

## 2021-06-01 DIAGNOSIS — E11621 Type 2 diabetes mellitus with foot ulcer: Secondary | ICD-10-CM | POA: Diagnosis not present

## 2021-06-01 DIAGNOSIS — Z7982 Long term (current) use of aspirin: Secondary | ICD-10-CM | POA: Diagnosis not present

## 2021-06-01 DIAGNOSIS — L97519 Non-pressure chronic ulcer of other part of right foot with unspecified severity: Secondary | ICD-10-CM | POA: Insufficient documentation

## 2021-06-01 HISTORY — DX: Unspecified osteoarthritis, unspecified site: M19.90

## 2021-06-01 HISTORY — DX: Personal history of other diseases of the digestive system: Z87.19

## 2021-06-01 HISTORY — PX: AMPUTATION: SHX166

## 2021-06-01 HISTORY — DX: Dyspnea, unspecified: R06.00

## 2021-06-01 HISTORY — DX: Type 2 diabetes mellitus without complications: E11.9

## 2021-06-01 HISTORY — DX: Peripheral vascular disease, unspecified: I73.9

## 2021-06-01 HISTORY — DX: Headache, unspecified: R51.9

## 2021-06-01 LAB — GLUCOSE, CAPILLARY
Glucose-Capillary: 166 mg/dL — ABNORMAL HIGH (ref 70–99)
Glucose-Capillary: 192 mg/dL — ABNORMAL HIGH (ref 70–99)

## 2021-06-01 LAB — POCT I-STAT, CHEM 8
BUN: 4 mg/dL — ABNORMAL LOW (ref 8–23)
Calcium, Ion: 1.21 mmol/L (ref 1.15–1.40)
Chloride: 101 mmol/L (ref 98–111)
Creatinine, Ser: 0.6 mg/dL (ref 0.44–1.00)
Glucose, Bld: 154 mg/dL — ABNORMAL HIGH (ref 70–99)
HCT: 39 % (ref 36.0–46.0)
Hemoglobin: 13.3 g/dL (ref 12.0–15.0)
Potassium: 3.1 mmol/L — ABNORMAL LOW (ref 3.5–5.1)
Sodium: 139 mmol/L (ref 135–145)
TCO2: 24 mmol/L (ref 22–32)

## 2021-06-01 SURGERY — AMPUTATION DIGIT
Anesthesia: Monitor Anesthesia Care | Site: Toe | Laterality: Right

## 2021-06-01 MED ORDER — ORAL CARE MOUTH RINSE: 15.0000 mL | Freq: Once | OROMUCOSAL | Status: AC

## 2021-06-01 MED ORDER — SUCCINYLCHOLINE CHLORIDE 200 MG/10ML IV SOSY
PREFILLED_SYRINGE | INTRAVENOUS | Status: AC
  Filled 2021-06-01: qty 10

## 2021-06-01 MED ORDER — TRAMADOL HCL 50 MG PO TABS: 50.0000 mg | ORAL_TABLET | Freq: Four times a day (QID) | ORAL | 0 refills | Status: DC | PRN

## 2021-06-01 MED ORDER — CHLORHEXIDINE GLUCONATE 0.12 % MT SOLN
15.0000 mL | Freq: Once | OROMUCOSAL | Status: AC
  Administered 2021-06-01: 15 mL via OROMUCOSAL
  Filled 2021-06-01: qty 15

## 2021-06-01 MED ORDER — FENTANYL CITRATE (PF) 100 MCG/2ML IJ SOLN: 25.0000 ug | INTRAMUSCULAR | Status: DC | PRN

## 2021-06-01 MED ORDER — PROPOFOL 500 MG/50ML IV EMUL
INTRAVENOUS | Status: DC | PRN
  Administered 2021-06-01: 100 ug/kg/min via INTRAVENOUS

## 2021-06-01 MED ORDER — BACITRACIN ZINC 500 UNIT/GM EX OINT
TOPICAL_OINTMENT | CUTANEOUS | Status: DC | PRN
  Administered 2021-06-01: 1 via TOPICAL

## 2021-06-01 MED ORDER — LIDOCAINE-EPINEPHRINE (PF) 1.5 %-1:200000 IJ SOLN
INTRAMUSCULAR | Status: DC | PRN
  Administered 2021-06-01: 30 mL via PERINEURAL

## 2021-06-01 MED ORDER — PROPOFOL 10 MG/ML IV BOLUS
INTRAVENOUS | Status: AC
  Filled 2021-06-01: qty 20

## 2021-06-01 MED ORDER — ONDANSETRON HCL 4 MG/2ML IJ SOLN
INTRAMUSCULAR | Status: AC
  Filled 2021-06-01: qty 2

## 2021-06-01 MED ORDER — LACTATED RINGERS IV SOLN: INTRAVENOUS | Status: DC | PRN

## 2021-06-01 MED ORDER — LIDOCAINE-EPINEPHRINE (PF) 1 %-1:200000 IJ SOLN
INTRAMUSCULAR | Status: DC | PRN
  Administered 2021-06-01: 10 mL

## 2021-06-01 MED ORDER — PHENYLEPHRINE HCL-NACL 20-0.9 MG/250ML-% IV SOLN
INTRAVENOUS | Status: DC | PRN
  Administered 2021-06-01: 25 ug/min via INTRAVENOUS

## 2021-06-01 MED ORDER — AMISULPRIDE (ANTIEMETIC) 5 MG/2ML IV SOLN: 10.0000 mg | Freq: Once | INTRAVENOUS | Status: DC | PRN

## 2021-06-01 MED ORDER — BACITRACIN ZINC 500 UNIT/GM EX OINT
TOPICAL_OINTMENT | CUTANEOUS | Status: AC
  Filled 2021-06-01: qty 28.35

## 2021-06-01 MED ORDER — ACETAMINOPHEN 500 MG PO TABS
1000.0000 mg | ORAL_TABLET | Freq: Once | ORAL | Status: AC
  Administered 2021-06-01: 1000 mg via ORAL
  Filled 2021-06-01: qty 2

## 2021-06-01 MED ORDER — HEPARIN SODIUM (PORCINE) 1000 UNIT/ML IJ SOLN
INTRAMUSCULAR | Status: AC
  Filled 2021-06-01: qty 1

## 2021-06-01 MED ORDER — VANCOMYCIN HCL IN DEXTROSE 1-5 GM/200ML-% IV SOLN
1000.0000 mg | INTRAVENOUS | Status: AC
  Administered 2021-06-01: 1000 mg via INTRAVENOUS
  Filled 2021-06-01: qty 200

## 2021-06-01 MED ORDER — MIDAZOLAM HCL 2 MG/2ML IJ SOLN
INTRAMUSCULAR | Status: DC | PRN
  Administered 2021-06-01: 2 mg via INTRAVENOUS

## 2021-06-01 MED ORDER — FENTANYL CITRATE (PF) 250 MCG/5ML IJ SOLN
INTRAMUSCULAR | Status: AC
  Filled 2021-06-01: qty 5

## 2021-06-01 MED ORDER — SODIUM CHLORIDE 0.9 % IV SOLN: INTRAVENOUS | Status: DC

## 2021-06-01 MED ORDER — CHLORHEXIDINE GLUCONATE 4 % EX LIQD: 60.0000 mL | Freq: Once | CUTANEOUS | Status: DC

## 2021-06-01 MED ORDER — PROMETHAZINE HCL 25 MG/ML IJ SOLN: 6.2500 mg | INTRAMUSCULAR | Status: DC | PRN

## 2021-06-01 MED ORDER — 0.9 % SODIUM CHLORIDE (POUR BTL) OPTIME
TOPICAL | Status: DC | PRN
  Administered 2021-06-01: 1000 mL

## 2021-06-01 MED ORDER — DEXAMETHASONE SODIUM PHOSPHATE 10 MG/ML IJ SOLN
INTRAMUSCULAR | Status: AC
  Filled 2021-06-01: qty 1

## 2021-06-01 MED ORDER — LIDOCAINE-EPINEPHRINE (PF) 1 %-1:200000 IJ SOLN
INTRAMUSCULAR | Status: AC
  Filled 2021-06-01: qty 30

## 2021-06-01 MED ORDER — PHENYLEPHRINE 40 MCG/ML (10ML) SYRINGE FOR IV PUSH (FOR BLOOD PRESSURE SUPPORT)
PREFILLED_SYRINGE | INTRAVENOUS | Status: DC | PRN
  Administered 2021-06-01: 80 ug via INTRAVENOUS

## 2021-06-01 MED ORDER — LABETALOL HCL 5 MG/ML IV SOLN
INTRAVENOUS | Status: AC
  Filled 2021-06-01: qty 4

## 2021-06-01 MED ORDER — MIDAZOLAM HCL 2 MG/2ML IJ SOLN
INTRAMUSCULAR | Status: AC
  Filled 2021-06-01: qty 2

## 2021-06-01 SURGICAL SUPPLY — 40 items
BAG COUNTER SPONGE SURGICOUNT (BAG) ×2 IMPLANT
BAG SPNG CNTER NS LX DISP (BAG) ×1
BLADE AVERAGE 25X9 (BLADE) IMPLANT
BLADE SURG 21 STRL SS (BLADE) ×2 IMPLANT
BNDG CONFORM 3 STRL LF (GAUZE/BANDAGES/DRESSINGS) ×2 IMPLANT
BNDG ELASTIC 4X5.8 VLCR STR LF (GAUZE/BANDAGES/DRESSINGS) ×2 IMPLANT
BNDG GAUZE ELAST 4 BULKY (GAUZE/BANDAGES/DRESSINGS) ×2 IMPLANT
CANISTER SUCT 3000ML PPV (MISCELLANEOUS) ×2 IMPLANT
COVER SURGICAL LIGHT HANDLE (MISCELLANEOUS) ×2 IMPLANT
DRAPE HALF SHEET 40X57 (DRAPES) ×2 IMPLANT
DRAPE ORTHO SPLIT 77X108 STRL (DRAPES) ×4
DRAPE SURG ORHT 6 SPLT 77X108 (DRAPES) ×2 IMPLANT
ELECT REM PT RETURN 9FT ADLT (ELECTROSURGICAL) ×2
ELECTRODE REM PT RTRN 9FT ADLT (ELECTROSURGICAL) ×1 IMPLANT
GAUZE SPONGE 4X4 12PLY STRL (GAUZE/BANDAGES/DRESSINGS) ×2 IMPLANT
GAUZE XEROFORM 1X8 LF (GAUZE/BANDAGES/DRESSINGS) ×2 IMPLANT
GAUZE XEROFORM 5X9 LF (GAUZE/BANDAGES/DRESSINGS) ×1 IMPLANT
GLOVE SURG POLYISO LF SZ8 (GLOVE) ×2 IMPLANT
GOWN STRL REUS W/ TWL LRG LVL3 (GOWN DISPOSABLE) ×2 IMPLANT
GOWN STRL REUS W/ TWL XL LVL3 (GOWN DISPOSABLE) ×1 IMPLANT
GOWN STRL REUS W/TWL LRG LVL3 (GOWN DISPOSABLE) ×4
GOWN STRL REUS W/TWL XL LVL3 (GOWN DISPOSABLE) ×2
KIT BASIN OR (CUSTOM PROCEDURE TRAY) ×2 IMPLANT
KIT TURNOVER KIT B (KITS) ×2 IMPLANT
NDL 18GX1X1/2 (RX/OR ONLY) (NEEDLE) IMPLANT
NEEDLE 18GX1X1/2 (RX/OR ONLY) (NEEDLE) ×2 IMPLANT
NS IRRIG 1000ML POUR BTL (IV SOLUTION) ×2 IMPLANT
PACK GENERAL/GYN (CUSTOM PROCEDURE TRAY) ×2 IMPLANT
PAD ARMBOARD 7.5X6 YLW CONV (MISCELLANEOUS) ×4 IMPLANT
SPONGE T-LAP 18X18 ~~LOC~~+RFID (SPONGE) ×1 IMPLANT
STAPLER VISISTAT 35W (STAPLE) ×3 IMPLANT
SUT ETHILON 2 0 PSLX (SUTURE) ×2 IMPLANT
SUT ETHILON 3 0 PS 1 (SUTURE) ×2 IMPLANT
SUT SILK 2 0SH CR/8 30 (SUTURE) IMPLANT
SUT VIC AB 2-0 CT1 18 (SUTURE) ×3 IMPLANT
SUT VIC AB 3-0 SH 8-18 (SUTURE) ×2 IMPLANT
TOWEL GREEN STERILE (TOWEL DISPOSABLE) ×4 IMPLANT
TOWEL GREEN STERILE FF (TOWEL DISPOSABLE) ×2 IMPLANT
UNDERPAD 30X36 HEAVY ABSORB (UNDERPADS AND DIAPERS) ×2 IMPLANT
WATER STERILE IRR 1000ML POUR (IV SOLUTION) ×2 IMPLANT

## 2021-06-01 NOTE — Anesthesia Procedure Notes (Signed)
Procedure Name: MAC Date/Time: 06/01/2021 7:45 AM Performed by: Leonor Liv, CRNA Pre-anesthesia Checklist: Patient identified, Emergency Drugs available, Suction available and Patient being monitored Oxygen Delivery Method: Simple face mask Induction Type: IV induction Placement Confirmation: positive ETCO2 Dental Injury: Teeth and Oropharynx as per pre-operative assessment

## 2021-06-01 NOTE — Anesthesia Procedure Notes (Signed)
Anesthesia Regional Block: Popliteal block   Pre-Anesthetic Checklist: , timeout performed,  Correct Patient, Correct Site, Correct Laterality,  Correct Procedure,, site marked,  Risks and benefits discussed,  Surgical consent,  Pre-op evaluation,  At surgeon's request  Laterality: Right  Prep: chloraprep       Needles:  Injection technique: Single-shot  Needle Type: Echogenic Stimulator Needle     Needle Length: 9cm  Needle Gauge: 21     Additional Needles:   Procedures:,,,, ultrasound used (permanent image in chart),,    Narrative:  Start time: 06/01/2021 7:45 AM End time: 06/01/2021 7:55 AM Injection made incrementally with aspirations every 5 mL.  Performed by: Personally  Anesthesiologist: Murvin Natal, MD  Additional Notes: Functioning IV was confirmed and monitors were applied.  A 47m 21ga Arrow echogenic stimulator needle was used. Sterile prep and drape,hand hygiene and sterile gloves were used.  Negative aspiration and negative test dose prior to incremental administration of local anesthetic. The patient tolerated the procedure well.

## 2021-06-01 NOTE — Interval H&P Note (Signed)
History and Physical Interval Note:  06/01/2021 7:39 AM  Tammy Mcpherson  has presented today for surgery, with the diagnosis of gangrene right second toe.  The various methods of treatment have been discussed with the patient and family. After consideration of risks, benefits and other options for treatment, the patient has consented to  Procedure(s): Right Second Toe Amputation (Right) as a surgical intervention.  The patient's history has been reviewed, patient examined, no change in status, stable for surgery.  I have reviewed the patient's chart and labs.  Questions were answered to the patient's satisfaction.     [name]

## 2021-06-01 NOTE — Transfer of Care (Signed)
Immediate Anesthesia Transfer of Care Note  Patient: Tammy Mcpherson  Procedure(s) Performed: Right great toe and Second Toe Amputation (Right: Toe)  Patient Location: PACU  Anesthesia Type:MAC  Level of Consciousness: awake, alert  and oriented  Airway & Oxygen Therapy: Patient Spontanous Breathing  Post-op Assessment: Report given to RN, Post -op Vital signs reviewed and stable and Patient moving all extremities  Post vital signs: Reviewed and stable  Last Vitals:  Vitals Value Taken Time  BP 104/52 06/01/21 0850  Temp    Pulse 72 06/01/21 0903  Resp 16 06/01/21 0903  SpO2 92 % 06/01/21 0903  Vitals shown include unvalidated device data.  Last Pain:  Vitals:   06/01/21 0647  TempSrc:   PainSc: 8       Patients Stated Pain Goal: 2 (60/60/04 5997)  Complications: No notable events documented.

## 2021-06-01 NOTE — Interval H&P Note (Signed)
History and Physical Interval Note:  06/01/2021 7:39 AM  Tammy Mcpherson  has presented today for surgery, with the diagnosis of gangrene right second toe.  The various methods of treatment have been discussed with the patient and family. After consideration of risks, benefits and other options for treatment, the patient has consented to  right first / second toe amputation as a surgical intervention.  The patient's history has been reviewed, patient examined, no change in status, stable for surgery.  I have reviewed the patient's chart and labs.  Questions were answered to the patient's satisfaction.     [name]

## 2021-06-01 NOTE — Anesthesia Postprocedure Evaluation (Signed)
Anesthesia Post Note  Patient: Tammy Mcpherson  Procedure(s) Performed: Right great toe and Second Toe Amputation (Right: Toe)     Patient location during evaluation: PACU Anesthesia Type: Regional Level of consciousness: awake Pain management: pain level controlled Vital Signs Assessment: post-procedure vital signs reviewed and stable Respiratory status: spontaneous breathing, nonlabored ventilation, respiratory function stable and patient connected to nasal cannula oxygen Cardiovascular status: stable and blood pressure returned to baseline Postop Assessment: no apparent nausea or vomiting Anesthetic complications: no   No notable events documented.  Last Vitals:  Vitals:   06/01/21 0920 06/01/21 0935  BP: 139/64 (!) 144/92  Pulse: 72 76  Resp: (!) 22 17  Temp:  (!) 36.1 C  SpO2: 91% 91%    Last Pain:  Vitals:   06/01/21 0935  TempSrc:   PainSc: 0-No pain                 Sakai Heinle P Zoiey Christy

## 2021-06-01 NOTE — Op Note (Signed)
DATE OF SERVICE: 06/01/2021  PATIENT:  Tammy Mcpherson  65 y.o. adult  PRE-OPERATIVE DIAGNOSIS:  dry gangrene right second toe, ulceration of great toe  POST-OPERATIVE DIAGNOSIS:  Same  PROCEDURE:   right first / second toe amputation  SURGEON:  Surgeon(s) and Role:    * [name], MD - Primary  ASSISTANT: none  ANESTHESIA:   local, regional, and MAC  EBL: min  BLOOD ADMINISTERED:none  DRAINS: none   LOCAL MEDICATIONS USED:  LIDOCAINE   SPECIMEN:  none  COUNTS: confirmed correct.  TOURNIQUET:  none  PATIENT DISPOSITION:  PACU - hemodynamically stable.   Delay start of Pharmacological VTE agent (>24hrs) due to surgical blood loss or risk of bleeding: no  INDICATION FOR PROCEDURE: Tammy Mcpherson is a 65 y.o. adult with dry gangrene of the right second toe and ulceration of the right great toe.  Been revascularized. After careful discussion of risks, benefits, and alternatives the patient was offered right first and second toe amputation. We specifically discussed risk of poor healing. The patient understood and wished to proceed.  OPERATIVE FINDINGS: Modest bleeding at the amputation margin.  Bone debrided back to lout generous flap creation.  I did have to explant a screw in her right great toe.  DESCRIPTION OF PROCEDURE: After identification of the patient in the pre-operative holding area, the patient was transferred to the operating room. The patient was positioned supine on the operating room table. Anesthesia was induced. The right foot was prepped and draped in standard fashion. A surgical pause was performed confirming correct patient, procedure, and operative location.  A generous fishmouth incision was made around the second and first toe.  This is carried down subtenons tissue until the bones were encountered.  These were sharply cut with a bone cutter.  The great toe and second toe were debrided back with a rongeur and rasp.  I did encounter a surgical screw in  her right great toe which I had to remove.  The cut edges of the bone were smoothed.  The wound was closed using a 2-0 Vicryl, 2-0 nylon, and a surgical stapler.  A sterile dressing was applied.  Upon completion of the case instrument and sharps counts were confirmed correct. The patient was transferred to the PACU in good condition. I was present for all portions of the procedure.  Yevonne Aline. Stanford Breed, MD [facility] Phone Number: [phone] 06/01/2021 8:39 AM

## 2021-06-01 NOTE — Telephone Encounter (Signed)
Patient's husband calls today to confirm dressing changes. Advised him they could change once a day as needed. Keep wound clean - mild soap and water, pat dry and cover. He verbalized understanding.

## 2021-06-02 ENCOUNTER — Encounter (HOSPITAL_COMMUNITY): Payer: Self-pay | Admitting: Vascular Surgery

## 2021-06-04 ENCOUNTER — Other Ambulatory Visit (HOSPITAL_COMMUNITY): Payer: Self-pay | Admitting: Hematology

## 2021-06-05 ENCOUNTER — Telehealth: Payer: Self-pay

## 2021-06-05 ENCOUNTER — Encounter (HOSPITAL_COMMUNITY): Payer: Self-pay | Admitting: Hematology

## 2021-06-05 NOTE — Telephone Encounter (Signed)
Called and left message for patient to call her PCP's office to schedule her Initial Medicare Wellness Visit.

## 2021-06-10 ENCOUNTER — Other Ambulatory Visit: Payer: Self-pay

## 2021-06-10 DIAGNOSIS — I739 Peripheral vascular disease, unspecified: Secondary | ICD-10-CM

## 2021-06-13 NOTE — Progress Notes (Signed)
POST OPERATIVE OFFICE NOTE    CC:  F/u for surgery  HPI:  This is a 65 y.o. adult who with dry gangrene of the right second toe and ulceration of the right great toe. She has hx of RLE arteriogram with right popliteal angioplasty and stenting and right AT angioplasty by Dr. Stanford Breed on 05/19/2021 for rest pain and gangrene.   Her Arteriogram findings of the RLE were : Common femoral artery: patent without flow limiting stenosis  Profunda femoris artery: patent without flow limiting stenosis   Superficial femoral artery: diffusely diseased Popliteal artery: above knee diffusely diseased (>95% stenosis). Behind knee and below knee patent. Anterior tibial artery: chronic occlusion at the origin causing critical stenosis of terminal popliteal / proximal TP trunk. Patent beyond the occlusion. Tibioperoneal trunk: patent without flow limiting stenosis Peroneal artery: patent without flow limiting stenosis Posterior tibial artery: patent without flow limiting stenosis Pedal circulation: pedal arch intact  On 05/27/2021, her duplex revealed adequate perfusion following recent revascularization and she was scheduled for right 1st and 2nd toe amputation, which she underwent on 06/01/2021 by Dr. Stanford Breed.   He did have to explant a screw in the right great toe.   Pt returns today for follow up.  Pt states she has been doing well.  She does still have some pain in the foot when she is up and about.  Husband tells me that they have been putting an ointment on it as well as some betadine.  She does still require 1/2 Tramadol at night for pain.  She does continue to smoke.  She is not having any issues with the left foot.   Allergies  Allergen Reactions   Bee Venom Swelling   Codeine Anaphylaxis    Tongue swelled   Penicillins Swelling and Rash    Has patient had a PCN reaction causing immediate rash, facial/tongue/throat swelling, SOB or lightheadedness with hypotension: No, delayed Has patient had a PCN  reaction causing severe rash involving mucus membranes or skin necrosis: No Has patient had a PCN reaction that required hospitalization No Has patient had a PCN reaction occurring within the last 10 years: No If all of the above answers are "NO", then may proceed with Cephalosporin use.    Bupropion Other (See Comments)    "Made my skin crawl"   Sudafed [Pseudoephedrine Hcl] Rash    Current Outpatient Medications  Medication Sig Dispense Refill   acetaminophen (TYLENOL) 500 MG tablet Take 500 mg by mouth at bedtime as needed for moderate pain.     albuterol (PROVENTIL) (2.5 MG/3ML) 0.083% nebulizer solution Take 3 mLs (2.5 mg total) by nebulization every 4 (four) hours as needed for wheezing or shortness of breath. 75 mL 5   albuterol (VENTOLIN HFA) 108 (90 Base) MCG/ACT inhaler INHALE TWO PUFFS INTO THE LUNGS EVERY SIX HOURS AS NEEDED FOR WHEEZING OR SHORTNESS OF BREATH 8.5 g 5   ALPRAZolam (XANAX) 0.5 MG tablet TAKE 1 TABLET BY MOUTH AT BEDTIME AS NEEDED FOR ANXIETY (Patient taking differently: Take 0.5 mg by mouth at bedtime as needed for sleep or anxiety.) 30 tablet 2   aspirin 81 MG chewable tablet Chew 81 mg by mouth every morning.      atorvastatin (LIPITOR) 40 MG tablet Take 1 tablet (40 mg total) by mouth daily. 30 tablet 2   bisoprolol (ZEBETA) 10 MG tablet TAKE ONE (1) TABLET BY MOUTH EVERY DAY 90 tablet 3   blood glucose meter kit and supplies Dispense based on patient  and insurance preference. Use up to four times daily as directed. (FOR ICD-10 E10.9, E11.9). 1 each 0   Budeson-Glycopyrrol-Formoterol (BREZTRI AEROSPHERE) 160-9-4.8 MCG/ACT AERO Inhale 2 puffs into the lungs 2 (two) times daily. 11.8 g 12   Cholecalciferol (VITAMIN D) 50 MCG (2000 UT) CAPS Take 1 capsule (2,000 Units total) by mouth daily. 90 capsule 4   clopidogrel (PLAVIX) 75 MG tablet Take 1 tablet (75 mg total) by mouth daily with breakfast. 30 tablet 0   Continuous Blood Gluc Receiver (FREESTYLE LIBRE 14 DAY  READER) DEVI See admin instructions. 4 each 0   Continuous Blood Gluc Receiver (FREESTYLE LIBRE 2 READER) DEVI Check blood glucose up to 4 times daily 1 each 0   cyanocobalamin (,VITAMIN B-12,) 1000 MCG/ML injection INJECT 1 ML INTO THE MUSCLE EVERY 30 DAYS. (Patient taking differently: Inject 1,000 mcg into the muscle every 30 (thirty) days.) 1 mL 11   cyclobenzaprine (FLEXERIL) 5 MG tablet TAKE ONE TABLET BY MOUTH UP TO EVERY 8 HOURS AS NEEDED. START WITH ONE TABLET AT BEDTIME AS NEEDED DUE TO SEDATION. (Patient taking differently: Take 5 mg by mouth 3 (three) times daily as needed for muscle spasms.) 15 tablet 2   diclofenac (VOLTAREN) 75 MG EC tablet Take 1 tablet (75 mg total) by mouth 2 (two) times daily. 30 tablet 0   dicyclomine (BENTYL) 10 MG capsule Take 10 mg by mouth as needed for spasms.     empagliflozin (JARDIANCE) 10 MG TABS tablet Take 10 mg by mouth daily before breakfast. 30 tablet 4   FLUoxetine (PROZAC) 20 MG capsule Take 1 capsule (20 mg total) by mouth daily. 90 capsule 3   gabapentin (NEURONTIN) 300 MG capsule TAKE ONE CAPSULE (300 MG TOTAL) BY MOUTHTWO TIMES DAILY. 180 capsule 1   glucose blood (ONETOUCH ULTRA) test strip Use as instructed 100 each 12   glucose monitoring kit (FREESTYLE) monitoring kit Use to check blood sugar BID as directed 1 each 0   lactase (LACTAID) 3000 units tablet Take by mouth as needed.     Lancets (ONETOUCH DELICA PLUS LANCET33G) MISC 1 each by Does not apply route 2 (two) times a day. 100 each 3   losartan (COZAAR) 100 MG tablet TAKE ONE (1) TABLET BY MOUTH EVERY DAY 90 tablet 3   metFORMIN (GLUCOPHAGE) 1000 MG tablet Take 0.5 tablets (500 mg total) by mouth 2 (two) times daily with a meal. Takes 500 mg twice daily.  Total of 1000 mg daily. (Patient taking differently: Take 500 mg by mouth 2 (two) times daily with a meal. Takes 500 mg twice daily.  Total of 1000 mg daily.) 180 tablet 3   Misc. Devices MISC Please provide supplies (needle, syringe,  alcohol swabs) needed for patient to self-administer B-12 injections monthly. 1 each 12   pantoprazole (PROTONIX) 40 MG tablet TAKE ONE (1) TABLET 30 MINS BEFORE YOUR FIRST MEAL. 90 tablet 3   spironolactone (ALDACTONE) 50 MG tablet Take 1 tablet (50 mg total) by mouth daily. 90 tablet 1   traMADol (ULTRAM) 50 MG tablet Take 1 tablet (50 mg total) by mouth every 6 (six) hours as needed. 20 tablet 0   ULTICARE TUBERCULIN SAFETY SYR 25G X 1" 1 ML MISC USE TO ADMINISTER INJECTABLE VITAMIN B12. 1 each 6   No current facility-administered medications for this visit.     ROS:  See HPI  Physical Exam:  Today's Vitals   06/14/21 1505  BP: 133/68  Pulse: 90  Resp: 20  Temp: 98.3 F (36.8 C)  TempSrc: Temporal  SpO2: 93%  Weight: 134 lb 14.4 oz (61.2 kg)  Height: _0  (1.651 m)  PainSc: 10-Worst pain ever  PainLoc: Foot   Body mass index is 22.45 kg/m.   Incision:     Extremities:  palpable DP pulses bilaterally   ABI/TBI 06/14/2021: Right:  0.83/amp Left:  0.85/0.5   Assessment/Plan:  This is a 65 y.o. adult who is s/p: RLE arteriogram with right popliteal angioplasty and stenting and right AT angioplasty by Dr. Stanford Breed on 05/19/2021 for rest pain and gangrene and right 1st and 2nd toe amputations on 06/01/2021 also by Dr. Stanford Breed  -pt's amputation is healing.  Will get staples and nylon sutures out today.  She does have palpable DP pulses bilaterally.   Wash daily with Dial soap and water and keep clean and protected.  Advised not to use ointment on the incision.  Okay to use betadine.   -will have her return in 3 months with RLE arterial duplex and ABI-they will call sooner if there are any issues before then.  -discussed importance of smoking cessation with pt.  -Tramadol 64m q12h prn pain #10 no refill and Plavix 769mdaily #30 with [facility] Pt and husband understand that we cannot prescribe any further narcotics after this visit.  They  expressed understanding.    SaLeontine LocketPASpecialty [facility] [phone] Clinic MD:  pt seen and examined with Dr. HaStanford Breed

## 2021-06-14 ENCOUNTER — Encounter: Payer: Self-pay | Admitting: Physician Assistant

## 2021-06-14 ENCOUNTER — Ambulatory Visit (HOSPITAL_COMMUNITY)
Admission: RE | Admit: 2021-06-14 | Discharge: 2021-06-14 | Disposition: A | Discharge status: Home / Self Care | Payer: PPO | Source: Ambulatory Visit | Attending: Vascular Surgery | Admitting: Vascular Surgery

## 2021-06-14 ENCOUNTER — Other Ambulatory Visit: Payer: Self-pay

## 2021-06-14 ENCOUNTER — Encounter: Payer: Self-pay | Admitting: Registered Nurse

## 2021-06-14 ENCOUNTER — Ambulatory Visit (INDEPENDENT_AMBULATORY_CARE_PROVIDER_SITE_OTHER): Payer: PPO | Admitting: Physician Assistant

## 2021-06-14 VITALS — BP 133/68 | HR 90 | Temp 98.3°F | Resp 20 | Ht 65.0 in | Wt 134.9 lb

## 2021-06-14 DIAGNOSIS — I739 Peripheral vascular disease, unspecified: Secondary | ICD-10-CM | POA: Diagnosis not present

## 2021-06-14 MED ORDER — TRAMADOL HCL 50 MG PO TABS: 50.0000 mg | ORAL_TABLET | Freq: Two times a day (BID) | ORAL | 0 refills | Status: DC | PRN

## 2021-06-14 MED ORDER — CLOPIDOGREL BISULFATE 75 MG PO TABS: 75.0000 mg | ORAL_TABLET | Freq: Every day | ORAL | 6 refills | Status: DC

## 2021-06-15 ENCOUNTER — Other Ambulatory Visit: Payer: Self-pay

## 2021-06-15 DIAGNOSIS — E1165 Type 2 diabetes mellitus with hyperglycemia: Secondary | ICD-10-CM

## 2021-06-15 MED ORDER — FREESTYLE LIBRE 2 READER DEVI: 0 refills | Status: DC

## 2021-06-16 ENCOUNTER — Other Ambulatory Visit: Payer: Self-pay

## 2021-06-16 ENCOUNTER — Encounter: Payer: Self-pay | Admitting: Registered Nurse

## 2021-06-16 DIAGNOSIS — I739 Peripheral vascular disease, unspecified: Secondary | ICD-10-CM

## 2021-06-17 ENCOUNTER — Other Ambulatory Visit: Payer: Self-pay | Admitting: Registered Nurse

## 2021-06-17 DIAGNOSIS — F411 Generalized anxiety disorder: Secondary | ICD-10-CM

## 2021-06-17 MED ORDER — FLUOXETINE HCL 40 MG PO CAPS: 40.0000 mg | ORAL_CAPSULE | Freq: Every day | ORAL | 3 refills | Status: DC

## 2021-06-21 ENCOUNTER — Inpatient Hospital Stay (HOSPITAL_COMMUNITY): Payer: PPO

## 2021-06-28 ENCOUNTER — Ambulatory Visit (HOSPITAL_COMMUNITY): Payer: PPO | Admitting: Hematology

## 2021-07-05 ENCOUNTER — Inpatient Hospital Stay (HOSPITAL_COMMUNITY): Payer: PPO | Attending: Hematology

## 2021-07-05 ENCOUNTER — Other Ambulatory Visit: Payer: Self-pay

## 2021-07-05 DIAGNOSIS — E119 Type 2 diabetes mellitus without complications: Secondary | ICD-10-CM | POA: Diagnosis not present

## 2021-07-05 DIAGNOSIS — M2550 Pain in unspecified joint: Secondary | ICD-10-CM | POA: Diagnosis not present

## 2021-07-05 DIAGNOSIS — R5383 Other fatigue: Secondary | ICD-10-CM | POA: Diagnosis not present

## 2021-07-05 DIAGNOSIS — Z8249 Family history of ischemic heart disease and other diseases of the circulatory system: Secondary | ICD-10-CM | POA: Insufficient documentation

## 2021-07-05 DIAGNOSIS — Z79899 Other long term (current) drug therapy: Secondary | ICD-10-CM | POA: Insufficient documentation

## 2021-07-05 DIAGNOSIS — Z23 Encounter for immunization: Secondary | ICD-10-CM | POA: Diagnosis not present

## 2021-07-05 DIAGNOSIS — J449 Chronic obstructive pulmonary disease, unspecified: Secondary | ICD-10-CM | POA: Insufficient documentation

## 2021-07-05 DIAGNOSIS — D75839 Thrombocytosis, unspecified: Secondary | ICD-10-CM | POA: Insufficient documentation

## 2021-07-05 DIAGNOSIS — E538 Deficiency of other specified B group vitamins: Secondary | ICD-10-CM | POA: Insufficient documentation

## 2021-07-05 DIAGNOSIS — D509 Iron deficiency anemia, unspecified: Secondary | ICD-10-CM | POA: Diagnosis not present

## 2021-07-05 DIAGNOSIS — F17210 Nicotine dependence, cigarettes, uncomplicated: Secondary | ICD-10-CM | POA: Insufficient documentation

## 2021-07-05 DIAGNOSIS — D72829 Elevated white blood cell count, unspecified: Secondary | ICD-10-CM | POA: Diagnosis not present

## 2021-07-05 DIAGNOSIS — D500 Iron deficiency anemia secondary to blood loss (chronic): Secondary | ICD-10-CM

## 2021-07-05 LAB — COMPREHENSIVE METABOLIC PANEL
ALT: 12 U/L (ref 0–44)
AST: 16 U/L (ref 15–41)
Albumin: 3.1 g/dL — ABNORMAL LOW (ref 3.5–5.0)
Alkaline Phosphatase: 94 U/L (ref 38–126)
Anion gap: 9 (ref 5–15)
BUN: 6 mg/dL — ABNORMAL LOW (ref 8–23)
CO2: 25 mmol/L (ref 22–32)
Calcium: 8.6 mg/dL — ABNORMAL LOW (ref 8.9–10.3)
Chloride: 102 mmol/L (ref 98–111)
Creatinine, Ser: 0.72 mg/dL (ref 0.44–1.00)
GFR, Estimated: >60 mL/min (ref >60)
Glucose, Bld: 186 mg/dL — ABNORMAL HIGH (ref 70–99)
Potassium: 3 mmol/L — ABNORMAL LOW (ref 3.5–5.1)
Sodium: 136 mmol/L (ref 135–145)
Total Bilirubin: 0.4 mg/dL (ref 0.3–1.2)
Total Protein: 6.7 g/dL (ref 6.5–8.1)

## 2021-07-05 LAB — CBC WITH DIFFERENTIAL/PLATELET
Abs Immature Granulocytes: 0.06 10*3/uL (ref 0.00–0.07)
Basophils Absolute: 0.1 10*3/uL (ref 0.0–0.1)
Basophils Relative: 0 %
Eosinophils Absolute: 0.2 10*3/uL (ref 0.0–0.5)
Eosinophils Relative: 2 %
HCT: 40.9 % (ref 36.0–46.0)
Hemoglobin: 12.9 g/dL (ref 12.0–15.0)
Immature Granulocytes: 1 %
Lymphocytes Relative: 15 %
Lymphs Abs: 1.8 10*3/uL (ref 0.7–4.0)
MCH: 29.9 pg (ref 26.0–34.0)
MCHC: 31.5 g/dL (ref 30.0–36.0)
MCV: 94.7 fL (ref 80.0–100.0)
Monocytes Absolute: 1.2 10*3/uL — ABNORMAL HIGH (ref 0.1–1.0)
Monocytes Relative: 10 %
Neutro Abs: 8.8 10*3/uL — ABNORMAL HIGH (ref 1.7–7.7)
Neutrophils Relative %: 72 %
Platelets: 539 10*3/uL — ABNORMAL HIGH (ref 150–400)
RBC: 4.32 MIL/uL (ref 3.87–5.11)
RDW: 14.4 % (ref 11.5–15.5)
WBC: 12.2 10*3/uL — ABNORMAL HIGH (ref 4.0–10.5)
nRBC: 0 % (ref 0.0–0.2)

## 2021-07-05 LAB — IRON AND TIBC
Iron: 40 ug/dL (ref 28–170)
Saturation Ratios: 13 % (ref 10.4–31.8)
TIBC: 316 ug/dL (ref 250–450)
UIBC: 276 ug/dL

## 2021-07-05 LAB — FERRITIN: Ferritin: 63 ng/mL (ref 11–307)

## 2021-07-05 LAB — LACTATE DEHYDROGENASE: LDH: 92 U/L — ABNORMAL LOW (ref 98–192)

## 2021-07-05 LAB — VITAMIN D 25 HYDROXY (VIT D DEFICIENCY, FRACTURES): Vit D, 25-Hydroxy: 54.64 ng/mL (ref 30–100)

## 2021-07-07 ENCOUNTER — Encounter (HOSPITAL_COMMUNITY): Payer: PPO

## 2021-07-07 ENCOUNTER — Encounter: Payer: PPO | Admitting: Vascular Surgery

## 2021-07-08 ENCOUNTER — Encounter (HOSPITAL_COMMUNITY): Payer: Self-pay

## 2021-07-08 ENCOUNTER — Other Ambulatory Visit (HOSPITAL_COMMUNITY): Payer: Self-pay | Admitting: Hematology

## 2021-07-11 ENCOUNTER — Other Ambulatory Visit: Payer: Self-pay | Admitting: Registered Nurse

## 2021-07-11 ENCOUNTER — Encounter (HOSPITAL_COMMUNITY): Payer: Self-pay | Admitting: Hematology

## 2021-07-11 ENCOUNTER — Other Ambulatory Visit (HOSPITAL_COMMUNITY): Payer: Self-pay | Admitting: *Deleted

## 2021-07-11 DIAGNOSIS — F411 Generalized anxiety disorder: Secondary | ICD-10-CM

## 2021-07-11 NOTE — Telephone Encounter (Signed)
Patient is requesting a refill of the following medications: Requested Prescriptions   Pending Prescriptions Disp Refills   cyclobenzaprine (FLEXERIL) 5 MG tablet [Pharmacy Med Name: CYCLOBENZAPRINE HCL 5 MG TAB] 15 tablet 2    Sig: TAKE ONE TABLET BY MOUTH UP TO EVERY 8 HOURS AS NEEDED. START WITH ONE TABLET AT BEDTIME AS NEEDED DUE TO SEDATION.    Date of patient request: 07/11/2021 Last office visit: 04/26/2021 Date of last refill: 02/21/2021 Last refill amount: 15 tablets 1 refill Follow up time period per chart: 08/12/2021

## 2021-07-11 NOTE — Telephone Encounter (Signed)
Patient is requesting a refill of the following medications: Requested Prescriptions   Signed Prescriptions Disp Refills   ALPRAZolam (XANAX) 0.5 MG tablet 30 tablet 0    Sig: Take 1 tablet (0.5 mg total) by mouth at bedtime as needed for anxiety or sleep.    Authorizing Provider: [name]

## 2021-07-12 NOTE — Progress Notes (Signed)
[place], [place] [address]   CLINIC:  Medical Oncology/Hematology  PCP:  [name], NP [address] [place] [address]  [phone]  REASON FOR VISIT:  Follow-up for IDA  PRIOR THERAPY: none  CURRENT THERAPY: Intermittent Feraheme  INTERVAL HISTORY:  Ms. KIMA MALENFANT, a 65 y.o. adult, returns for routine follow-up for her IDA. Aerabella was last seen on 02/15/2021.  Today she reports feeling good. She denies any easy bruising, black stools, and current bleeding. She reports fair but intermittently poor appetite. She reports fluctuating energy levels. She is currently taking Plavix. She reports ice cravings. She reports recent amputation of several toes on her right foot due to poor circulation.   REVIEW OF SYSTEMS:  Review of Systems  Constitutional:  Positive for appetite change (80%) and fatigue (50%).  HENT:   Negative for nosebleeds.   Respiratory:  Negative for hemoptysis.   Gastrointestinal:  Negative for blood in stool.  Genitourinary:  Negative for hematuria.   Musculoskeletal:  Positive for arthralgias (4/10 R foot).  Hematological:  Does not bruise/bleed easily.  Psychiatric/Behavioral:  The patient is nervous/anxious.   All other systems reviewed and are negative.  PAST MEDICAL/SURGICAL HISTORY:  Past Medical History:  Diagnosis Date   Anemia 04/30/2016   Anxiety    Arthritis    Blood transfusion without reported diagnosis    Cardiomyopathy ([place])    a. EF 40-45% by echo in 04/2016 b. Improved to 60-65% by repeat imaging in 2018   COPD (chronic obstructive pulmonary disease) (HCC)    Coronary artery calcification seen on CT scan    Diabetes mellitus without complication (HCC)    Dyspnea    Essential hypertension    GERD (gastroesophageal reflux disease)    Headache    History of bronchitis    History of GI bleed    History of hiatal hernia    Hyperlipidemia    Iron deficiency anemia 05/12/2016    Bleeding ulcers   Leukocytosis 03/23/2020   Peripheral vascular disease (HCC)    Pollen allergy    PSVT (paroxysmal supraventricular tachycardia) (HCC)    PVC's (premature ventricular contractions)    Thrombocytosis 03/23/2020   Vitamin B12 deficiency 03/23/2020   Past Surgical History:  Procedure Laterality Date   ABDOMINAL AORTOGRAM W/LOWER EXTREMITY Bilateral 05/19/2021   Procedure: ABDOMINAL AORTOGRAM W/LOWER EXTREMITY;  Surgeon: [name], MD;  Location: [facility];  Service: Cardiovascular;  Laterality: Bilateral;   ABDOMINAL HYSTERECTOMY     polyps  about age 68   AIKEN OSTEOTOMY Right 07/10/2013   Procedure: Barbie Banner OSTEOTOMY RIGHT FOOT;  Surgeon: Marcheta Grammes, DPM;  Location: [facility];  Service: Orthopedics;  Laterality: Right;   AMPUTATION Left 05/09/2017   Procedure: AMPUTATION 5TH TOE LEFT FOOT;  Surgeon: [name], DPM;  Location: [facility];  Service: Podiatry;  Laterality: Left;   AMPUTATION Left 06/13/2017   Procedure: AMPUTATION 2ND TOE LEFT FOOT;  Surgeon: [name], DPM;  Location: [facility];  Service: Podiatry;  Laterality: Left;   AMPUTATION Right 06/01/2021   Procedure: Right great toe and Second Toe Amputation;  Surgeon: [name], MD;  Location: [facility];  Service: Vascular;  Laterality: Right;   BUNIONECTOMY Right 07/10/2013   Procedure: Prudy Feeler BUNIONECTOMY RIGHT FOOT;  Surgeon: Marcheta Grammes, DPM;  Location: [facility];  Service: Orthopedics;  Laterality: Right;   CHOLECYSTECTOMY N/A 08/22/2017   Procedure: LAPAROSCOPIC CHOLECYSTECTOMY;  Surgeon:  Virl Cagey, MD;  Location: [facility];  Service: General;  Laterality: N/A;   COLONOSCOPY N/A 07/07/2016   Dr. Oneida Alar; redundant left colon, diverticulosis at hepatic flexure, non-bleeding internal hemorrhoids   ESOPHAGOGASTRODUODENOSCOPY N/A 07/07/2016   Dr. Oneida Alar: many non-bleeding cratered gastric ulcers without stigmata of bleeding in gastric antrum. four 2-3 mm angioectasias  without bleeding in duodenal bulb and second portion of duodenum s/p APC. Chroni gastritis on path.    ESOPHAGOGASTRODUODENOSCOPY N/A 08/03/2017   Dr. Oneida Alar: erosive gastritis, AVMs. Found a single non-bleeding angioectasia in stomach, s/p APC therapy. Four non-bleeding angioectasias in duodenum s/p APC. Non-bleeding erosive gastropathy   IR ANGIOGRAM EXTREMITY LEFT  04/24/2017   IR FEM POP ART ATHERECT INC PTA MOD SED  04/24/2017   IR INFUSION THROMBOL ARTERIAL INITIAL (MS)  04/24/2017   IR RADIOLOGIST EVAL & MGMT  12/05/2016   IR US GUIDE VASC ACCESS RIGHT  04/24/2017   LIVER BIOPSY N/A 08/22/2017   Procedure: LIVER BIOPSY;  Surgeon: Virl Cagey, MD;  Location: [facility];  Service: General;  Laterality: N/A;   METATARSAL HEAD EXCISION Right 07/10/2013   Procedure: METATARSAL HEAD RESECTION OF DIGITS 2 AND 3 RIGHT FOOT;  Surgeon: Marcheta Grammes, DPM;  Location: [facility];  Service: Orthopedics;  Laterality: Right;   PERIPHERAL VASCULAR INTERVENTION Right 05/19/2021   Procedure: PERIPHERAL VASCULAR INTERVENTION;  Surgeon: [name], MD;  Location: [facility];  Service: Cardiovascular;  Laterality: Right;   PROXIMAL INTERPHALANGEAL FUSION (PIP) Right 07/10/2013   Procedure: ARTHRODESIS PIPJ  2ND DIGIT RIGHT FOOT;  Surgeon: Marcheta Grammes, DPM;  Location: [facility];  Service: Orthopedics;  Laterality: Right;    SOCIAL HISTORY:  Social History   Socioeconomic History   Marital status: Married    Spouse name: [name]   Number of children: 1   Years of education: 12   Highest education level: Not on file  Occupational History   Occupation: disabled    Comment: heart  Tobacco Use   Smoking status: Light Smoker    Packs/day: 0.50    Years: 44.00    Pack years: 22.00    Types: Cigarettes    Start date: 05/10/1975   Smokeless tobacco: Never   Tobacco comments:    2 cigarettes per day 12/10/2019  Vaping Use   Vaping Use: Former  Substance and Sexual Activity   Alcohol  use: Not Currently    Alcohol/week: 2.0 standard drinks    Types: 2 Glasses of wine per week   Drug use: No   Sexual activity: Yes    Birth control/protection: Surgical  Other Topics Concern   Not on file  Social History Narrative   Disabled   Lives with husband [name]   Two dogs   Social Determinants of Health   Financial Resource Strain: Not on file  Food Insecurity: Not on file  Transportation Needs: Not on file  Physical Activity: Not on file  Stress: Not on file  Social Connections: Not on file  Intimate Partner Violence: Not At Risk   Fear of Current or Ex-Partner: No   Emotionally Abused: No   Physically Abused: No   Sexually Abused: No    FAMILY HISTORY:  Family History  Adopted: Yes  Problem Relation Age of Onset   Hypertension Father    Coronary artery disease Sister    Ulcers Maternal Grandmother    Colon cancer Neg Hx        unknown, was adopted  CURRENT MEDICATIONS:  Current Outpatient Medications  Medication Sig Dispense Refill   aspirin 81 MG chewable tablet Chew 81 mg by mouth every morning.      atorvastatin (LIPITOR) 40 MG tablet Take 1 tablet (40 mg total) by mouth daily. 30 tablet 2   bisoprolol (ZEBETA) 10 MG tablet TAKE ONE (1) TABLET BY MOUTH EVERY DAY 90 tablet 3   blood glucose meter kit and supplies Dispense based on patient and insurance preference. Use up to four times daily as directed. (FOR ICD-10 E10.9, E11.9). 1 each 0   Budeson-Glycopyrrol-Formoterol (BREZTRI AEROSPHERE) 160-9-4.8 MCG/ACT AERO Inhale 2 puffs into the lungs 2 (two) times daily. 11.8 g 12   Cholecalciferol (VITAMIN D) 50 MCG (2000 UT) CAPS Take 1 capsule (2,000 Units total) by mouth daily. 90 capsule 4   clopidogrel (PLAVIX) 75 MG tablet Take 1 tablet (75 mg total) by mouth daily with breakfast. 30 tablet 6   Continuous Blood Gluc Receiver (FREESTYLE LIBRE 14 DAY READER) DEVI See admin instructions. 4 each 0   Continuous Blood Gluc Receiver (FREESTYLE LIBRE 2 READER)  DEVI Check blood glucose up to 4 times daily 1 each 0   Continuous Blood Gluc Sensor (FREESTYLE LIBRE 2 SENSOR) MISC See admin instructions.     cyanocobalamin (,VITAMIN B-12,) 1000 MCG/ML injection INJECT 1 ML INTO THE MUSCLE EVERY 30 DAYS. (Patient taking differently: Inject 1,000 mcg into the muscle every 30 (thirty) days.) 1 mL 11   diclofenac (VOLTAREN) 75 MG EC tablet Take 1 tablet (75 mg total) by mouth 2 (two) times daily. 30 tablet 0   dicyclomine (BENTYL) 10 MG capsule Take 10 mg by mouth as needed for spasms.     empagliflozin (JARDIANCE) 10 MG TABS tablet Take 10 mg by mouth daily before breakfast. 30 tablet 4   FLUoxetine (PROZAC) 40 MG capsule Take 1 capsule (40 mg total) by mouth daily. 90 capsule 3   gabapentin (NEURONTIN) 300 MG capsule TAKE ONE CAPSULE (300 MG TOTAL) BY MOUTHTWO TIMES DAILY. 180 capsule 1   glucose blood (ONETOUCH ULTRA) test strip Use as instructed 100 each 12   glucose monitoring kit (FREESTYLE) monitoring kit Use to check blood sugar BID as directed 1 each 0   lactase (LACTAID) 3000 units tablet Take by mouth as needed.     Lancets (ONETOUCH DELICA PLUS [id number]) MISC 1 each by Does not apply route 2 (two) times a day. 100 each 3   losartan (COZAAR) 100 MG tablet TAKE ONE (1) TABLET BY MOUTH EVERY DAY 90 tablet 3   metFORMIN (GLUCOPHAGE) 1000 MG tablet Take 0.5 tablets (500 mg total) by mouth 2 (two) times daily with a meal. Takes 500 mg twice daily.  Total of 1000 mg daily. (Patient taking differently: Take 500 mg by mouth 2 (two) times daily with a meal. Takes 500 mg twice daily.  Total of 1000 mg daily.) 180 tablet 3   Misc. Devices MISC Please provide supplies (needle, syringe, alcohol swabs) needed for patient to self-administer B-12 injections monthly. 1 each 12   pantoprazole (PROTONIX) 40 MG tablet TAKE ONE (1) TABLET 30 MINS BEFORE YOUR FIRST MEAL. 90 tablet 3   spironolactone (ALDACTONE) 50 MG tablet Take 1 tablet (50 mg total) by mouth daily. 90  tablet 1   ULTICARE TUBERCULIN SAFETY SYR 25G X 1" 1 ML MISC USE TO ADMINISTER INJECTABLE VITAMIN B12. 1 each 0   acetaminophen (TYLENOL) 500 MG tablet Take 500 mg by mouth at bedtime as needed for moderate  pain. (Patient not taking: Reported on 07/13/2021)     albuterol (PROVENTIL) (2.5 MG/3ML) 0.083% nebulizer solution Take 3 mLs (2.5 mg total) by nebulization every 4 (four) hours as needed for wheezing or shortness of breath. (Patient not taking: Reported on 07/13/2021) 75 mL 5   albuterol (VENTOLIN HFA) 108 (90 Base) MCG/ACT inhaler INHALE TWO PUFFS INTO THE LUNGS EVERY SIX HOURS AS NEEDED FOR WHEEZING OR SHORTNESS OF BREATH (Patient not taking: Reported on 07/13/2021) 8.5 g 5   ALPRAZolam (XANAX) 0.5 MG tablet Take 1 tablet (0.5 mg total) by mouth at bedtime as needed for anxiety or sleep. (Patient not taking: Reported on 07/13/2021) 30 tablet 0   cyclobenzaprine (FLEXERIL) 5 MG tablet Take 1 tablet (5 mg total) by mouth 3 (three) times daily as needed for muscle spasms. (Patient not taking: Reported on 07/13/2021) 15 tablet 3   traMADol (ULTRAM) 50 MG tablet Take 1 tablet (50 mg total) by mouth every 12 (twelve) hours as needed. (Patient not taking: Reported on 07/13/2021) 10 tablet 0   Current Facility-Administered Medications  Medication Dose Route Frequency Provider Last Rate Last Admin   influenza vac split quadrivalent PF (FLUARIX) injection 0.5 mL  0.5 mL Intramuscular Once Derek Jack, MD        ALLERGIES:  Allergies  Allergen Reactions   Bee Venom Swelling   Codeine Anaphylaxis    Tongue swelled   Penicillins Swelling and Rash    Has patient had a PCN reaction causing immediate rash, facial/tongue/throat swelling, SOB or lightheadedness with hypotension: No, delayed Has patient had a PCN reaction causing severe rash involving mucus membranes or skin necrosis: No Has patient had a PCN reaction that required hospitalization No Has patient had a PCN reaction occurring  within the last 10 years: No If all of the above answers are "NO", then may proceed with Cephalosporin use.    Bupropion Other (See Comments)    "Made my skin crawl"   Sudafed [Pseudoephedrine Hcl] Rash    PHYSICAL EXAM:  Performance status (ECOG): 1 - Symptomatic but completely ambulatory  Vitals:   07/13/21 1159  BP: 132/81  Pulse: 91  Resp: 16  Temp: (!) 96.7 F (35.9 C)  SpO2: 93%   Wt Readings from Last 3 Encounters:  07/13/21 128 lb 4.8 oz (58.2 kg)  06/14/21 134 lb 14.4 oz (61.2 kg)  06/01/21 138 lb (62.6 kg)   Physical Exam Vitals reviewed.  Constitutional:      Appearance: Normal appearance.  Cardiovascular:     Rate and Rhythm: Normal rate and regular rhythm.     Pulses: Normal pulses.     Heart sounds: Normal heart sounds.  Pulmonary:     Effort: Pulmonary effort is normal.     Breath sounds: Normal breath sounds.  Neurological:     General: No focal deficit present.     Mental Status: She is alert and oriented to person, place, and time.  Psychiatric:        Mood and Affect: Mood normal.        Behavior: Behavior normal.    LABORATORY DATA:  I have reviewed the labs as listed.  CBC Latest Ref Rng & Units 07/05/2021 06/01/2021 05/19/2021  WBC 4.0 - 10.5 K/uL 12.2(H) - 13.9(H)  Hemoglobin 12.0 - 15.0 g/dL 12.9 13.3 14.3  Hematocrit 36.0 - 46.0 % 40.9 39.0 42.6  Platelets 150 - 400 K/uL 539(H) - 340   CMP Latest Ref Rng & Units 07/05/2021 06/01/2021 05/19/2021  Glucose 70 -  99 mg/dL 186(H) 154(H) -  BUN 8 - 23 mg/dL 6(L) 4(L) -  Creatinine 0.44 - 1.00 mg/dL 0.72 0.60 0.54  Sodium 135 - 145 mmol/L 136 139 -  Potassium 3.5 - 5.1 mmol/L 3.0(L) 3.1(L) -  Chloride 98 - 111 mmol/L 102 101 -  CO2 22 - 32 mmol/L 25 - -  Calcium 8.9 - 10.3 mg/dL 8.6(L) - -  Total Protein 6.5 - 8.1 g/dL 6.7 - -  Total Bilirubin 0.3 - 1.2 mg/dL 0.4 - -  Alkaline Phos 38 - 126 U/L 94 - -  AST 15 - 41 U/L 16 - -  ALT 0 - 44 U/L 12 - -      Component Value Date/Time   RBC  4.32 07/05/2021 1139   MCV 94.7 07/05/2021 1139   MCV 94 09/01/2020 1510   MCH 29.9 07/05/2021 1139   MCHC 31.5 07/05/2021 1139   RDW 14.4 07/05/2021 1139   RDW 12.8 09/01/2020 1510   LYMPHSABS 1.8 07/05/2021 1139   LYMPHSABS 1.8 07/02/2019 1539   MONOABS 1.2 (H) 07/05/2021 1139   EOSABS 0.2 07/05/2021 1139   EOSABS 0.1 07/02/2019 1539   BASOSABS 0.1 07/05/2021 1139   BASOSABS 0.1 07/02/2019 1539    DIAGNOSTIC IMAGING:  I have independently reviewed the scans and discussed with the patient. VAS [place] ABI WITH/WO TBI  Result Date: 06/14/2021  LOWER EXTREMITY DOPPLER STUDY Patient Name:  PAULLA MCCLASKEY  Date of Exam:   06/14/2021 Medical Rec #: [id number]      Accession #:    [id number] Date of Birth: [birth year]     Patient Gender: F Patient Age:   50 years Exam Location:  [facility] Procedure:      VAS [place] ABI WITH/WO TBI Referring Phys: Jamelle Haring --------------------------------------------------------------------------------  Indications: Claudication, rest pain, ulceration, gangrene, and peripheral              artery disease. High Risk Factors: Hypertension, coronary artery disease.  Vascular Interventions: 05/19/2021 Right Popliteal stenting and angioplasty                         (5x113m [place])                         Anterior tibial angioplasty (3x632m[place])                         06/01/21: Amputation of right great toe and 2nd toe. Performing Technologist: HeRalene CorkVT  Examination Guidelines: A complete evaluation includes at minimum, Doppler waveform signals and systolic blood pressure reading at the level of bilateral brachial, anterior tibial, and posterior tibial arteries, when vessel segments are accessible. Bilateral testing is considered an integral part of a complete examination. Photoelectric Plethysmograph (PPG) waveforms and toe systolic pressure readings are included as required and additional duplex testing as needed. Limited examinations for  reoccurring indications may be performed as noted.  ABI Findings: +---------+------------------+-----+----------+----------+ Right    Rt Pressure (mmHg)IndexWaveform  Comment    +---------+------------------+-----+----------+----------+ Brachial 143                                         +---------+------------------+-----+----------+----------+ PTA      119               0.83 monophasic           +---------+------------------+-----+----------+----------+  DP       91                0.63 monophasic           +---------+------------------+-----+----------+----------+ Great Toe                                 amputation +---------+------------------+-----+----------+----------+ +---------+------------------+-----+----------+-------+ Left     Lt Pressure (mmHg)IndexWaveform  Comment +---------+------------------+-----+----------+-------+ Brachial 144                                      +---------+------------------+-----+----------+-------+ PTA      123               0.85 monophasic        +---------+------------------+-----+----------+-------+ DP       97                0.67 monophasic        +---------+------------------+-----+----------+-------+ Great Toe72                0.50                   +---------+------------------+-----+----------+-------+ +-------+-----------+-----------+------------+------------+ ABI/TBIToday's ABIToday's TBIPrevious ABIPrevious TBI +-------+-----------+-----------+------------+------------+ Right  0.83       amputation 0.98        0.25         +-------+-----------+-----------+------------+------------+ Left   0.85       0.5        0.9         0.58         +-------+-----------+-----------+------------+------------+  Previous ABI on 05/27/21.  Summary: Right: Resting right ankle-brachial index indicates mild right lower extremity arterial disease. Left: Resting left ankle-brachial index indicates mild left lower  extremity arterial disease. The left toe-brachial index is abnormal.  *See table(s) above for measurements and observations.  Electronically signed by Jamelle Haring on 06/14/2021 at 4:11:37 PM.    Final      ASSESSMENT:  1.  Iron deficiency anemia: -Thought to be from chronic GI blood loss from gastric and small bowel AVMs.  Patient on aspirin 81 mg.  Plavix discontinued in October 2019.  She had a history of multiple blood transfusions. -Last Feraheme infusion on 06/25/2020 on 07/07/2020.   2.  Leukocytosis and thrombocytosis: -Boutelle count on 12/09/2019 was 15.2.  Differential shows 74% neutrophils, 14% lymphocytes and 9% monocytes and 2% eosinophils. -Prior work-up for JAK2 V617F testing and BCR/ABL by FISH was negative. -Platelets are normalized to 391.  This is reactive from iron deficiency. -She is on steroid inhalers which could be contributing to leukocytosis.   3.  B12 deficiency: -She is on monthly B12 injections. -Her B12 on 12/09/2019 was 455.   PLAN:  1.  Iron deficiency anemia: - Ferritin is 63 and percent saturation 13.  Hemoglobin is 12.9. - She reports feeling very tired.  She is back on aspirin and Plavix.  She recently had right foot toes amputated.  She denies any bleeding per rectum or melena. - As her ferritin is trending down and she is feeling very tired, recommend either Feraheme x2 or Venofer 300 mg-300 mg - 400 mg. - RTC 4 months with repeat labs.   2.  Leukocytosis and thrombocytosis: - She was previously tested for myeloproliferative disorders which was negative. - Leaver count is 12.2 with predominant  neutrophils and monocytes.  Platelet count is 539.  No further work-up needed at this time.  If any worsening, will consider bone marrow biopsy.   3.  Vitamin D deficiency: - Vitamin D is 54.  Continue vitamin D daily.  Orders placed this encounter:  No orders of the defined types were placed in this encounter.    Derek Jack, MD [facility] [phone]   I, Thana Ates, am acting as a scribe for Dr. Derek Jack.  I, Derek Jack MD, have reviewed the above documentation for accuracy and completeness, and I agree with the above.

## 2021-07-13 ENCOUNTER — Other Ambulatory Visit: Payer: Self-pay

## 2021-07-13 ENCOUNTER — Inpatient Hospital Stay (HOSPITAL_COMMUNITY): Payer: PPO | Admitting: Hematology

## 2021-07-13 VITALS — BP 132/81 | HR 91 | Temp 96.7°F | Resp 16 | Wt 128.3 lb

## 2021-07-13 DIAGNOSIS — D500 Iron deficiency anemia secondary to blood loss (chronic): Secondary | ICD-10-CM

## 2021-07-13 DIAGNOSIS — D509 Iron deficiency anemia, unspecified: Secondary | ICD-10-CM | POA: Diagnosis not present

## 2021-07-13 DIAGNOSIS — Z23 Encounter for immunization: Secondary | ICD-10-CM

## 2021-07-13 MED ORDER — INFLUENZA VAC SPLIT QUAD 0.5 ML IM SUSY
0.5000 mL | PREFILLED_SYRINGE | Freq: Once | INTRAMUSCULAR | Status: AC
  Administered 2021-07-13: 0.5 mL via INTRAMUSCULAR
  Filled 2021-07-13: qty 0.5

## 2021-07-13 NOTE — Patient Instructions (Signed)
[place] at [facility] Discharge Instructions  You were seen and examined by Dr. Delton Coombes today.  We will schedule you for iron infusions. Return as scheduled for repeat lab work and office visit.    Thank you for choosing [place] at [facility] to provide your oncology and hematology care.  To afford each patient quality time with our provider, please arrive at least 15 minutes before your scheduled appointment time.   If you have a lab appointment with the [facility] please come in thru the Main Entrance and check in at the main information desk.  You need to re-schedule your appointment should you arrive 10 or more minutes late.  We strive to give you quality time with our providers, and arriving late affects you and other patients whose appointments are after yours.  Also, if you no show three or more times for appointments you may be dismissed from the clinic at the providers discretion.     Again, thank you for choosing [facility].  Our hope is that these requests will decrease the amount of time that you wait before being seen by our physicians.       _____________________________________________________________  Should you have questions after your visit to [facility], please contact our office at [phone] and follow the prompts.  Our office hours are 8:00 a.m. and 4:30 p.m. Monday - Friday.  Please note that voicemails left after 4:00 p.m. may not be returned until the following business day.  We are closed weekends and major holidays.  You do have access to a nurse 24-7, just call the main number to the clinic [phone] and do not press any options, hold on the line and a nurse will answer the phone.    For prescription refill requests, have your pharmacy contact our office and allow 72 hours.    Due to Covid, you will need to wear a mask upon entering the hospital. If you do not have a mask, a  mask will be given to you at the Main Entrance upon arrival. For doctor visits, patients may have 1 support person age 42 or older with them. For treatment visits, patients can not have anyone with them due to social distancing guidelines and our immunocompromised population.

## 2021-07-13 NOTE — Progress Notes (Signed)
Patient come in today for office visit. Flu vaccine ordered. See MAR for injection administration. Patient remained stable throughout injection. Patient discharged from clinic ambulatory and in stable condition.

## 2021-07-19 ENCOUNTER — Inpatient Hospital Stay (HOSPITAL_COMMUNITY): Payer: PPO

## 2021-07-19 ENCOUNTER — Encounter (HOSPITAL_COMMUNITY): Payer: Self-pay

## 2021-07-19 ENCOUNTER — Other Ambulatory Visit: Payer: Self-pay

## 2021-07-19 VITALS — BP 163/76 | HR 79 | Temp 96.7°F | Resp 17

## 2021-07-19 DIAGNOSIS — D508 Other iron deficiency anemias: Secondary | ICD-10-CM

## 2021-07-19 DIAGNOSIS — D509 Iron deficiency anemia, unspecified: Secondary | ICD-10-CM | POA: Diagnosis not present

## 2021-07-19 MED ORDER — LORATADINE 10 MG PO TABS
10.0000 mg | ORAL_TABLET | Freq: Once | ORAL | Status: AC
  Administered 2021-07-19: 10 mg via ORAL
  Filled 2021-07-19: qty 1

## 2021-07-19 MED ORDER — SODIUM CHLORIDE 0.9 % IV SOLN: Freq: Once | INTRAVENOUS | Status: AC

## 2021-07-19 MED ORDER — SODIUM CHLORIDE 0.9 % IV SOLN
510.0000 mg | Freq: Once | INTRAVENOUS | Status: AC
  Administered 2021-07-19: 510 mg via INTRAVENOUS
  Filled 2021-07-19: qty 510

## 2021-07-19 MED ORDER — ACETAMINOPHEN 325 MG PO TABS
650.0000 mg | ORAL_TABLET | Freq: Once | ORAL | Status: AC
  Administered 2021-07-19: 650 mg via ORAL
  Filled 2021-07-19: qty 2

## 2021-07-19 NOTE — Patient Instructions (Signed)
[facility]  Discharge Instructions: ?Thank you for choosing [facility] to provide your oncology and hematology care.  ?If you have a lab appointment with the Cancer Center, please come in thru the Main Entrance and check in at the main information desk. ? ?Wear comfortable clothing and clothing appropriate for easy access to any Portacath or PICC line.  ? ?We strive to give you quality time with your provider. You may need to reschedule your appointment if you arrive late (15 or more minutes).  Arriving late affects you and other patients whose appointments are after yours.  Also, if you miss three or more appointments without notifying the office, you may be dismissed from the clinic at the provider?s discretion.    ?  ?For prescription refill requests, have your pharmacy contact our office and allow 72 hours for refills to be completed.   ? ?Today you received the following Feraheme, return as scheduled. ?  ?To help prevent nausea and vomiting after your treatment, we encourage you to take your nausea medication as directed. ? ?BELOW ARE SYMPTOMS THAT SHOULD BE REPORTED IMMEDIATELY: ?*FEVER GREATER THAN 100.4 F (38 ?C) OR HIGHER ?*CHILLS OR SWEATING ?*NAUSEA AND VOMITING THAT IS NOT CONTROLLED WITH YOUR NAUSEA MEDICATION ?*UNUSUAL SHORTNESS OF BREATH ?*UNUSUAL BRUISING OR BLEEDING ?*URINARY PROBLEMS (pain or burning when urinating, or frequent urination) ?*BOWEL PROBLEMS (unusual diarrhea, constipation, pain near the anus) ?TENDERNESS IN MOUTH AND THROAT WITH OR WITHOUT PRESENCE OF ULCERS (sore throat, sores in mouth, or a toothache) ?UNUSUAL RASH, SWELLING OR PAIN  ?UNUSUAL VAGINAL DISCHARGE OR ITCHING  ? ?Items with * indicate a potential emergency and should be followed up as soon as possible or go to the Emergency Department if any problems should occur. ? ?Please show the CHEMOTHERAPY ALERT CARD or IMMUNOTHERAPY ALERT CARD at check-in to the Emergency Department and triage  nurse. ? ?Should you have questions after your visit or need to cancel or reschedule your appointment, please contact [facility] [phone]  and follow the prompts.  Office hours are 8:00 a.m. to 4:30 p.m. Monday - Friday. Please note that voicemails left after 4:00 p.m. may not be returned until the following business day.  We are closed weekends and major holidays. You have access to a nurse at all times for urgent questions. Please call the main number to the clinic [phone] and follow the prompts. ? ?For any non-urgent questions, you may also contact your provider using MyChart. We now offer e-Visits for anyone 18 and older to request care online for non-urgent symptoms. For details visit [email or web address]. ?  ?Also download the MyChart app! Go to the app store, search "MyChart", open the app, select , and log in with your MyChart username and password. ? ?Due to Covid, a mask is required upon entering the hospital/clinic. If you do not have a mask, one will be given to you upon arrival. For doctor visits, patients may have 1 support person aged 18 or older with them. For treatment visits, patients cannot have anyone with them due to current Covid guidelines and our immunocompromised population.  ?

## 2021-07-19 NOTE — Progress Notes (Signed)
Patient tolerated iron infusion with no complaints voiced.  Peripheral IV site clean and dry with good blood return noted before and after infusion.  Band aid applied.  VSS with discharge and left in satisfactory condition with no s/s of distress noted.   

## 2021-07-24 NOTE — Telephone Encounter (Signed)
Thank you Rich!

## 2021-07-26 ENCOUNTER — Other Ambulatory Visit: Payer: Self-pay

## 2021-07-26 ENCOUNTER — Inpatient Hospital Stay (HOSPITAL_COMMUNITY): Payer: PPO

## 2021-07-26 VITALS — BP 169/72 | HR 75 | Temp 98.0°F | Resp 18

## 2021-07-26 DIAGNOSIS — D508 Other iron deficiency anemias: Secondary | ICD-10-CM

## 2021-07-26 DIAGNOSIS — D509 Iron deficiency anemia, unspecified: Secondary | ICD-10-CM | POA: Diagnosis not present

## 2021-07-26 MED ORDER — SODIUM CHLORIDE 0.9 % IV SOLN
510.0000 mg | Freq: Once | INTRAVENOUS | Status: AC
  Administered 2021-07-26: 510 mg via INTRAVENOUS
  Filled 2021-07-26: qty 17

## 2021-07-26 MED ORDER — SODIUM CHLORIDE 0.9 % IV SOLN: Freq: Once | INTRAVENOUS | Status: AC

## 2021-07-26 MED ORDER — LORATADINE 10 MG PO TABS
10.0000 mg | ORAL_TABLET | Freq: Once | ORAL | Status: AC
  Administered 2021-07-26: 10 mg via ORAL
  Filled 2021-07-26: qty 1

## 2021-07-26 MED ORDER — ACETAMINOPHEN 325 MG PO TABS
650.0000 mg | ORAL_TABLET | Freq: Once | ORAL | Status: AC
  Administered 2021-07-26: 650 mg via ORAL
  Filled 2021-07-26: qty 2

## 2021-07-26 NOTE — Progress Notes (Signed)
Patient presents today for iron infusion.  Patient is in satisfactory condition with no new complaints voiced.  Vital signs are stable.  We will proceed with treatment per MD orders.   Patient tolerated treatment well with no complaints voiced.  Patient left ambulatory in stable condition.  Vital signs stable at discharge.  Follow up as scheduled.    

## 2021-07-26 NOTE — Patient Instructions (Signed)
[facility]  Discharge Instructions: Thank you for choosing [facility] to provide your oncology and hematology care.  If you have a lab appointment with the Cancer Center, please come in thru the Main Entrance and check in at the main information desk.  Wear comfortable clothing and clothing appropriate for easy access to any Portacath or PICC line.   We strive to give you quality time with your provider. You may need to reschedule your appointment if you arrive late (15 or more minutes).  Arriving late affects you and other patients whose appointments are after yours.  Also, if you miss three or more appointments without notifying the office, you may be dismissed from the clinic at the provider's discretion.      For prescription refill requests, have your pharmacy contact our office and allow 72 hours for refills to be completed.        To help prevent nausea and vomiting after your treatment, we encourage you to take your nausea medication as directed.  BELOW ARE SYMPTOMS THAT SHOULD BE REPORTED IMMEDIATELY: *FEVER GREATER THAN 100.4 F (38 C) OR HIGHER *CHILLS OR SWEATING *NAUSEA AND VOMITING THAT IS NOT CONTROLLED WITH YOUR NAUSEA MEDICATION *UNUSUAL SHORTNESS OF BREATH *UNUSUAL BRUISING OR BLEEDING *URINARY PROBLEMS (pain or burning when urinating, or frequent urination) *BOWEL PROBLEMS (unusual diarrhea, constipation, pain near the anus) TENDERNESS IN MOUTH AND THROAT WITH OR WITHOUT PRESENCE OF ULCERS (sore throat, sores in mouth, or a toothache) UNUSUAL RASH, SWELLING OR PAIN  UNUSUAL VAGINAL DISCHARGE OR ITCHING   Items with * indicate a potential emergency and should be followed up as soon as possible or go to the Emergency Department if any problems should occur.  Please show the CHEMOTHERAPY ALERT CARD or IMMUNOTHERAPY ALERT CARD at check-in to the Emergency Department and triage nurse.  Should you have questions after your visit or need to cancel  or reschedule your appointment, please contact [facility] [phone]  and follow the prompts.  Office hours are 8:00 a.m. to 4:30 p.m. Monday - Friday. Please note that voicemails left after 4:00 p.m. may not be returned until the following business day.  We are closed weekends and major holidays. You have access to a nurse at all times for urgent questions. Please call the main number to the clinic [phone] and follow the prompts.  For any non-urgent questions, you may also contact your provider using MyChart. We now offer e-Visits for anyone 18 and older to request care online for non-urgent symptoms. For details visit [email or web address].   Also download the MyChart app! Go to the app store, search "MyChart", open the app, select [facility], and log in with your MyChart username and password.  Due to Covid, a mask is required upon entering the hospital/clinic. If you do not have a mask, one will be given to you upon arrival. For doctor visits, patients may have 1 support person aged 18 or older with them. For treatment visits, patients cannot have anyone with them due to current Covid guidelines and our immunocompromised population.  

## 2021-08-03 ENCOUNTER — Encounter: Payer: Self-pay | Admitting: Registered Nurse

## 2021-08-04 ENCOUNTER — Other Ambulatory Visit: Payer: Self-pay

## 2021-08-04 ENCOUNTER — Telehealth: Payer: Self-pay

## 2021-08-04 DIAGNOSIS — E1165 Type 2 diabetes mellitus with hyperglycemia: Secondary | ICD-10-CM

## 2021-08-04 DIAGNOSIS — E1169 Type 2 diabetes mellitus with other specified complication: Secondary | ICD-10-CM

## 2021-08-04 MED ORDER — FREESTYLE LIBRE 2 SENSOR MISC: 1 refills | Status: DC

## 2021-08-04 MED ORDER — FREESTYLE LIBRE 2 READER DEVI: 0 refills | Status: DC

## 2021-08-04 NOTE — Telephone Encounter (Signed)
Please clarify how patient is to use the sensors for her DM. She has several different Rx orders in her med list for sensors.

## 2021-08-05 NOTE — Telephone Encounter (Signed)
Would recommend their videos online, such as: [email or web address]  Or she can speak with a pharmacist who should be able to advise on proper use  Thank you  Rich

## 2021-08-12 ENCOUNTER — Encounter: Payer: Self-pay | Admitting: Registered Nurse

## 2021-08-12 ENCOUNTER — Ambulatory Visit (INDEPENDENT_AMBULATORY_CARE_PROVIDER_SITE_OTHER): Payer: PPO | Admitting: Registered Nurse

## 2021-08-12 VITALS — BP 153/62 | HR 90 | Temp 98.1°F | Resp 18 | Ht 65.0 in | Wt 127.0 lb

## 2021-08-12 DIAGNOSIS — Z0000 Encounter for general adult medical examination without abnormal findings: Secondary | ICD-10-CM

## 2021-08-12 DIAGNOSIS — E1169 Type 2 diabetes mellitus with other specified complication: Secondary | ICD-10-CM

## 2021-08-12 NOTE — Progress Notes (Signed)
Established Patient Office Visit  Subjective:  [name], adult    DOB: 07/07/1956  Age: 65 y.o. MRN: [id number]  CC:  Chief Complaint  Patient presents with   Annual Exam    Patient state she is here for CPE with no other concerns.    HPI Tammy Mc[facility]son presents for CPE  No acute concerns  Histories reviewed and updated with patient.   Due for t2dm eye exam. Referral placed.  Past Medical History:  Diagnosis Date   Anemia 04/30/2016   Anxiety    Arthritis    Blood transfusion without reported diagnosis    Cardiomyopathy ([place])    a. EF 40-45% by echo in 04/2016 b. Improved to 60-65% by repeat imaging in 2018   COPD (chronic obstructive pulmonary disease) (HCC)    Coronary artery calcification seen on CT scan    Diabetes mellitus without complication (HCC)    Dyspnea    Essential hypertension    GERD (gastroesophageal reflux disease)    Headache    History of bronchitis    History of GI bleed    History of hiatal hernia    Hyperlipidemia    Iron deficiency anemia 05/12/2016   Bleeding ulcers   Leukocytosis 03/23/2020   Peripheral vascular disease (HCC)    Pollen allergy    PSVT (paroxysmal supraventricular tachycardia) (HCC)    PVC's (premature ventricular contractions)    Thrombocytosis 03/23/2020   Vitamin B12 deficiency 03/23/2020    Past Surgical History:  Procedure Laterality Date   ABDOMINAL AORTOGRAM W/LOWER EXTREMITY Bilateral 05/19/2021   Procedure: ABDOMINAL AORTOGRAM W/LOWER EXTREMITY;  Surgeon: [name], MD;  Location: [facility];  Service: Cardiovascular;  Laterality: Bilateral;   ABDOMINAL HYSTERECTOMY     polyps  about age 47   AIKEN OSTEOTOMY Right 07/10/2013   Procedure: Barbie Banner OSTEOTOMY RIGHT FOOT;  Surgeon: Marcheta Grammes, DPM;  Location: [facility];  Service: Orthopedics;  Laterality: Right;   AMPUTATION Left 05/09/2017   Procedure: AMPUTATION 5TH TOE LEFT FOOT;  Surgeon: [name], DPM;   Location: [facility];  Service: Podiatry;  Laterality: Left;   AMPUTATION Left 06/13/2017   Procedure: AMPUTATION 2ND TOE LEFT FOOT;  Surgeon: [name], DPM;  Location: [facility];  Service: Podiatry;  Laterality: Left;   AMPUTATION Right 06/01/2021   Procedure: Right great toe and Second Toe Amputation;  Surgeon: [name], MD;  Location: [facility];  Service: Vascular;  Laterality: Right;   BUNIONECTOMY Right 07/10/2013   Procedure: Prudy Feeler BUNIONECTOMY RIGHT FOOT;  Surgeon: Marcheta Grammes, DPM;  Location: [facility];  Service: Orthopedics;  Laterality: Right;   CHOLECYSTECTOMY N/A 08/22/2017   Procedure: LAPAROSCOPIC CHOLECYSTECTOMY;  Surgeon: Virl Cagey, MD;  Location: [facility];  Service: General;  Laterality: N/A;   COLONOSCOPY N/A 07/07/2016   Dr. Oneida Alar; redundant left colon, diverticulosis at hepatic flexure, non-bleeding internal hemorrhoids   ESOPHAGOGASTRODUODENOSCOPY N/A 07/07/2016   Dr. Oneida Alar: many non-bleeding cratered gastric ulcers without stigmata of bleeding in gastric antrum. four 2-3 mm angioectasias without bleeding in duodenal bulb and second portion of duodenum s/p APC. Chroni gastritis on path.    ESOPHAGOGASTRODUODENOSCOPY N/A 08/03/2017   Dr. Oneida Alar: erosive gastritis, AVMs. Found a single non-bleeding angioectasia in stomach, s/p APC therapy. Four non-bleeding angioectasias in duodenum s/p APC. Non-bleeding erosive gastropathy   IR ANGIOGRAM EXTREMITY LEFT  04/24/2017   IR FEM POP ART ATHERECT INC PTA MOD SED  04/24/2017   IR INFUSION THROMBOL  ARTERIAL INITIAL (MS)  04/24/2017   IR RADIOLOGIST EVAL & MGMT  12/05/2016   IR US GUIDE VASC ACCESS RIGHT  04/24/2017   LIVER BIOPSY N/A 08/22/2017   Procedure: LIVER BIOPSY;  Surgeon: Virl Cagey, MD;  Location: [facility];  Service: General;  Laterality: N/A;   METATARSAL HEAD EXCISION Right 07/10/2013   Procedure: METATARSAL HEAD RESECTION OF DIGITS 2 AND 3 RIGHT FOOT;  Surgeon: Marcheta Grammes, DPM;  [facility];  Service: Orthopedics;  Laterality: Right;   PERIPHERAL VASCULAR INTERVENTION Right 05/19/2021   Procedure: PERIPHERAL VASCULAR INTERVENTION;  Surgeon: [name], MD;  Location: [facility];  Service: Cardiovascular;  Laterality: Right;   PROXIMAL INTERPHALANGEAL FUSION (PIP) Right 07/10/2013   Procedure: ARTHRODESIS PIPJ  2ND DIGIT RIGHT FOOT;  Surgeon: Marcheta Grammes, DPM;  Location: [facility];  Service: Orthopedics;  Laterality: Right;    Family History  Adopted: Yes  Problem Relation Age of Onset   Hypertension Father    Coronary artery disease Sister    Ulcers Maternal Grandmother    Colon cancer Neg Hx        unknown, was adopted    Social History   Socioeconomic History   Marital status: Married    Spouse name: [name]   Number of children: 1   Years of education: 12   Highest education level: Not on file  Occupational History   Occupation: disabled    Comment: heart  Tobacco Use   Smoking status: Light Smoker    Packs/day: 0.50    Years: 44.00    Pack years: 22.00    Types: Cigarettes    Start date: 05/10/1975   Smokeless tobacco: Never   Tobacco comments:    2 cigarettes per day 12/10/2019  Vaping Use   Vaping Use: Former  Substance and Sexual Activity   Alcohol use: Not Currently    Alcohol/week: 2.0 standard drinks    Types: 2 Glasses of wine per week   Drug use: No   Sexual activity: Yes    Birth control/protection: Surgical  Other Topics Concern   Not on file  Social History Narrative   Disabled   Lives with husband [name]   Two dogs   Social Determinants of Health   Financial Resource Strain: Not on file  Food Insecurity: Not on file  Transportation Needs: Not on file  Physical Activity: Not on file  Stress: Not on file  Social Connections: Not on file  Intimate Partner Violence: Not At Risk   Fear of Current or Ex-Partner: No   Emotionally Abused: No   Physically Abused: No   Sexually Abused: No    Outpatient  Medications Prior to Visit  Medication Sig Dispense Refill   acetaminophen (TYLENOL) 500 MG tablet Take 500 mg by mouth at bedtime as needed for moderate pain.     albuterol (PROVENTIL) (2.5 MG/3ML) 0.083% nebulizer solution Take 3 mLs (2.5 mg total) by nebulization every 4 (four) hours as needed for wheezing or shortness of breath. 75 mL 5   albuterol (VENTOLIN HFA) 108 (90 Base) MCG/ACT inhaler INHALE TWO PUFFS INTO THE LUNGS EVERY SIX HOURS AS NEEDED FOR WHEEZING OR SHORTNESS OF BREATH 8.5 g 5   ALPRAZolam (XANAX) 0.5 MG tablet Take 1 tablet (0.5 mg total) by mouth at bedtime as needed for anxiety or sleep. 30 tablet 0   aspirin 81 MG chewable tablet Chew 81 mg by mouth every morning.      atorvastatin (  LIPITOR) 40 MG tablet Take 1 tablet (40 mg total) by mouth daily. 30 tablet 2   bisoprolol (ZEBETA) 10 MG tablet TAKE ONE (1) TABLET BY MOUTH EVERY DAY 90 tablet 3   blood glucose meter kit and supplies Dispense based on patient and insurance preference. Use up to four times daily as directed. (FOR ICD-10 E10.9, E11.9). 1 each 0   Budeson-Glycopyrrol-Formoterol (BREZTRI AEROSPHERE) 160-9-4.8 MCG/ACT AERO Inhale 2 puffs into the lungs 2 (two) times daily. 11.8 g 12   Cholecalciferol (VITAMIN D) 50 MCG (2000 UT) CAPS Take 1 capsule (2,000 Units total) by mouth daily. 90 capsule 4   clopidogrel (PLAVIX) 75 MG tablet Take 1 tablet (75 mg total) by mouth daily with breakfast. 30 tablet 6   Continuous Blood Gluc Receiver (FREESTYLE LIBRE 14 DAY READER) DEVI See admin instructions. 4 each 0   Continuous Blood Gluc Receiver (FREESTYLE LIBRE 2 READER) DEVI Check blood glucose up to 4 times daily 1 each 0   Continuous Blood Gluc Sensor (FREESTYLE LIBRE 2 SENSOR) MISC See admin instructions.     Continuous Blood Gluc Sensor (FREESTYLE LIBRE 2 SENSOR) MISC Apply one sensor to the upper arm avery 14 days. 6 each 1   cyanocobalamin (,VITAMIN B-12,) 1000 MCG/ML injection INJECT 1 ML INTO THE MUSCLE EVERY 30  DAYS. (Patient taking differently: Inject 1,000 mcg into the muscle every 30 (thirty) days.) 1 mL 11   cyclobenzaprine (FLEXERIL) 5 MG tablet Take 1 tablet (5 mg total) by mouth 3 (three) times daily as needed for muscle spasms. 15 tablet 3   diclofenac (VOLTAREN) 75 MG EC tablet Take 1 tablet (75 mg total) by mouth 2 (two) times daily. 30 tablet 0   dicyclomine (BENTYL) 10 MG capsule Take 10 mg by mouth as needed for spasms.     empagliflozin (JARDIANCE) 10 MG TABS tablet Take 10 mg by mouth daily before breakfast. 30 tablet 4   FLUoxetine (PROZAC) 40 MG capsule Take 1 capsule (40 mg total) by mouth daily. 90 capsule 3   gabapentin (NEURONTIN) 300 MG capsule TAKE ONE CAPSULE (300 MG TOTAL) BY MOUTHTWO TIMES DAILY. 180 capsule 1   glucose blood (ONETOUCH ULTRA) test strip Use as instructed 100 each 12   glucose monitoring kit (FREESTYLE) monitoring kit Use to check blood sugar BID as directed 1 each 0   lactase (LACTAID) 3000 units tablet Take by mouth as needed.     Lancets (ONETOUCH DELICA PLUS [id number]) MISC 1 each by Does not apply route 2 (two) times a day. 100 each 3   losartan (COZAAR) 100 MG tablet TAKE ONE (1) TABLET BY MOUTH EVERY DAY 90 tablet 3   metFORMIN (GLUCOPHAGE) 1000 MG tablet Take 0.5 tablets (500 mg total) by mouth 2 (two) times daily with a meal. Takes 500 mg twice daily.  Total of 1000 mg daily. (Patient taking differently: Take 500 mg by mouth 2 (two) times daily with a meal. Takes 500 mg twice daily.  Total of 1000 mg daily.) 180 tablet 3   Misc. Devices MISC Please provide supplies (needle, syringe, alcohol swabs) needed for patient to self-administer B-12 injections monthly. 1 each 12   pantoprazole (PROTONIX) 40 MG tablet TAKE ONE (1) TABLET 30 MINS BEFORE YOUR FIRST MEAL. 90 tablet 3   spironolactone (ALDACTONE) 50 MG tablet Take 1 tablet (50 mg total) by mouth daily. 90 tablet 1   traMADol (ULTRAM) 50 MG tablet Take 1 tablet (50 mg total) by mouth every 12 (twelve)  hours  as needed. 10 tablet 0   ULTICARE TUBERCULIN SAFETY SYR 25G X 1" 1 ML MISC USE TO ADMINISTER INJECTABLE VITAMIN B12. 1 each 0   No facility-administered medications prior to visit.    Allergies  Allergen Reactions   Bee Venom Swelling   Codeine Anaphylaxis    Tongue swelled   Penicillins Swelling and Rash    Has patient had a PCN reaction causing immediate rash, facial/tongue/throat swelling, SOB or lightheadedness with hypotension: No, delayed Has patient had a PCN reaction causing severe rash involving mucus membranes or skin necrosis: No Has patient had a PCN reaction that required hospitalization No Has patient had a PCN reaction occurring within the last 10 years: No If all of the above answers are "NO", then may proceed with Cephalosporin use.    Bupropion Other (See Comments)    "Made my skin crawl"   Sudafed [Pseudoephedrine Hcl] Rash    ROS Review of Systems  Constitutional: Negative.   HENT: Negative.    Eyes: Negative.   Respiratory: Negative.    Cardiovascular: Negative.   Gastrointestinal: Negative.   Genitourinary: Negative.   Musculoskeletal: Negative.   Skin: Negative.   Neurological: Negative.   Psychiatric/Behavioral: Negative.    All other systems reviewed and are negative.    Objective:    Physical Exam Vitals and nursing note reviewed.  Constitutional:      General: She is not in acute distress.    Appearance: Normal appearance. She is normal weight. She is not ill-appearing, toxic-appearing or diaphoretic.  HENT:     Head: Normocephalic and atraumatic.     Right Ear: Tympanic membrane, ear canal and external ear normal. There is no impacted cerumen.     Left Ear: Tympanic membrane, ear canal and external ear normal. There is no impacted cerumen.     Nose: Nose normal. No congestion or rhinorrhea.     Mouth/Throat:     Mouth: Mucous membranes are moist.     Pharynx: Oropharynx is clear. No oropharyngeal exudate or posterior oropharyngeal  erythema.  Eyes:     General: No scleral icterus.       Right eye: No discharge.        Left eye: No discharge.     Extraocular Movements: Extraocular movements intact.     Conjunctiva/sclera: Conjunctivae normal.     Pupils: Pupils are equal, round, and reactive to light.  Neck:     Vascular: No carotid bruit.  Cardiovascular:     Rate and Rhythm: Normal rate and regular rhythm.     Pulses: Normal pulses.     Heart sounds: Normal heart sounds. No murmur heard.   No friction rub. No gallop.  Pulmonary:     Effort: Pulmonary effort is normal. No respiratory distress.     Breath sounds: Normal breath sounds. No stridor. No wheezing, rhonchi or rales.  Chest:     Chest wall: No tenderness.  Abdominal:     General: Abdomen is flat. Bowel sounds are normal. There is no distension.     Palpations: Abdomen is soft. There is no mass.     Tenderness: There is no abdominal tenderness. There is no right CVA tenderness, left CVA tenderness, guarding or rebound.     Hernia: No hernia is present.  Musculoskeletal:        General: No swelling, tenderness, deformity or signs of injury. Normal range of motion.     Cervical back: Normal range of motion and neck supple. No rigidity or  tenderness.     Right lower leg: No edema.     Left lower leg: No edema.  Lymphadenopathy:     Cervical: No cervical adenopathy.  Skin:    General: Skin is warm and dry.     Capillary Refill: Capillary refill takes 2 to 3 seconds. Dorsalis pedis pulses absent.  Posterior tibial pulses +1 - weak    Coloration: Skin is not jaundiced or pale.     Findings: No bruising, erythema, lesion or rash.  Neurological:     General: No focal deficit present.     Mental Status: She is alert and oriented to person, place, and time. Mental status is at baseline.     Cranial Nerves: No cranial nerve deficit.     Motor: No weakness.     Gait: Gait normal.  Psychiatric:        Mood and Affect: Mood normal.        Behavior:  Behavior normal.        Thought Content: Thought content normal.        Judgment: Judgment normal.    BP (!) 153/62   Pulse 90   Temp 98.1 F (36.7 C) (Temporal)   Resp 18   Ht '5\' 5"'  (1.651 m)   Wt 127 lb (57.6 kg)   SpO2 99%   BMI 21.13 kg/m  Wt Readings from Last 3 Encounters:  08/12/21 127 lb (57.6 kg)  07/13/21 128 lb 4.8 oz (58.2 kg)  06/14/21 134 lb 14.4 oz (61.2 kg)     Health Maintenance Due  Topic Date Due   COVID-19 Vaccine (1) Never done   OPHTHALMOLOGY EXAM  Never done   Zoster Vaccines- Shingrix (1 of 2) Never done    There are no preventive care reminders to display for this patient.  Lab Results  Component Value Date   TSH 2.25 04/26/2021   Lab Results  Component Value Date   WBC 12.2 (H) 07/05/2021   HGB 12.9 07/05/2021   HCT 40.9 07/05/2021   MCV 94.7 07/05/2021   PLT 539 (H) 07/05/2021   Lab Results  Component Value Date   NA 136 07/05/2021   K 3.0 (L) 07/05/2021   CO2 25 07/05/2021   GLUCOSE 186 (H) 07/05/2021   BUN 6 (L) 07/05/2021   CREATININE 0.72 07/05/2021   BILITOT 0.4 07/05/2021   ALKPHOS 94 07/05/2021   AST 16 07/05/2021   ALT 12 07/05/2021   PROT 6.7 07/05/2021   ALBUMIN 3.1 (L) 07/05/2021   CALCIUM 8.6 (L) 07/05/2021   ANIONGAP 9 07/05/2021   GFR 94.26 04/26/2021   Lab Results  Component Value Date   CHOL 199 04/26/2021   Lab Results  Component Value Date   HDL 28.80 (L) 04/26/2021   Lab Results  Component Value Date   LDLCALC 113 (H) 09/01/2020   Lab Results  Component Value Date   TRIG 375.0 (H) 04/26/2021   Lab Results  Component Value Date   CHOLHDL 7 04/26/2021   Lab Results  Component Value Date   HGBA1C 7.2 (H) 05/17/2021      Assessment & Plan:   Problem List Items Addressed This Visit       Endocrine   Type 2 diabetes mellitus ([place])   Relevant Orders   Ambulatory referral to Ophthalmology   CBC with Differential/Platelet   Comprehensive metabolic panel   Hemoglobin A1c   Lipid  panel   TSH   Other Visit Diagnoses     Annual  physical exam    -  Primary   Relevant Orders   CBC with Differential/Platelet   Comprehensive metabolic panel   Hemoglobin A1c   Lipid panel   TSH       No orders of the defined types were placed in this encounter.   Follow-up: Return in about 6 months (around 02/09/2022) for t2dm, chronic conditions.   PLAN S/p r great and second toe amputation. Changes suggestive of pvd on both lower legs. Otherwise exam unremarkable. Labs collected. Will follow up with the patient as warranted. Patient encouraged to call clinic with any questions, comments, or concerns.  [name], NP

## 2021-08-12 NOTE — Patient Instructions (Addendum)
Mr. Cottrell -   Doristine Devoid to see you.  Glad you've been doing ok since the procedure.  Labs will be back tomorrow - I'll call with anything urgent.   See you in 6 mo, sooner if you need anything.  Thank you  Rich     If you have lab work done today you will be contacted with your lab results within the next 2 weeks.  If you have not heard from [place] then please contact us. The fastest way to get your results is to register for My Chart.   IF you received an x-ray today, you will receive an invoice from [facility]. Please contact [facility] at [phone] with questions or concerns regarding your invoice.   IF you received labwork today, you will receive an invoice from [place]. Please contact [facility] at [phone] with questions or concerns regarding your invoice.   Our billing staff will not be able to assist you with questions regarding bills from these companies.  You will be contacted with the lab results as soon as they are available. The fastest way to get your results is to activate your My Chart account. Instructions are located on the last page of this paperwork. If you have not heard from [place] regarding the results in 2 weeks, please contact this office.

## 2021-08-13 ENCOUNTER — Encounter (HOSPITAL_COMMUNITY): Payer: Self-pay

## 2021-08-13 ENCOUNTER — Encounter: Payer: Self-pay | Admitting: Registered Nurse

## 2021-08-13 LAB — COMPREHENSIVE METABOLIC PANEL
AG Ratio: 1.1 (calc) (ref 1.0–2.5)
ALT: 5 U/L — ABNORMAL LOW (ref 6–29)
AST: 8 U/L — ABNORMAL LOW (ref 10–35)
Albumin: 3.3 g/dL — ABNORMAL LOW (ref 3.6–5.1)
Alkaline phosphatase (APISO): 117 U/L (ref 37–153)
BUN: 7 mg/dL (ref 7–25)
CO2: 28 mmol/L (ref 20–32)
Calcium: 9.4 mg/dL (ref 8.6–10.4)
Chloride: 102 mmol/L (ref 98–110)
Creat: 0.64 mg/dL (ref 0.50–1.05)
Globulin: 2.9 g/dL (calc) (ref 1.9–3.7)
Glucose, Bld: 203 mg/dL — ABNORMAL HIGH (ref 65–99)
Potassium: 3.3 mmol/L — ABNORMAL LOW (ref 3.5–5.3)
Sodium: 141 mmol/L (ref 135–146)
Total Bilirubin: 0.3 mg/dL (ref 0.2–1.2)
Total Protein: 6.2 g/dL (ref 6.1–8.1)

## 2021-08-13 LAB — HEMOGLOBIN A1C
Hgb A1c MFr Bld: 6.6 % of total Hgb — ABNORMAL HIGH (ref <5.7)
Mean Plasma Glucose: 143 mg/dL
eAG (mmol/L): 7.9 mmol/L

## 2021-08-13 LAB — CBC WITH DIFFERENTIAL/PLATELET
Absolute Monocytes: 1360 cells/uL — ABNORMAL HIGH (ref 200–950)
Basophils Absolute: 54 cells/uL (ref 0–200)
Basophils Relative: 0.4 %
Eosinophils Absolute: 95 cells/uL (ref 15–500)
Eosinophils Relative: 0.7 %
HCT: 47.1 % — ABNORMAL HIGH (ref 35.0–45.0)
Hemoglobin: 15.3 g/dL (ref 11.7–15.5)
Lymphs Abs: 1578 cells/uL (ref 850–3900)
MCH: 29.7 pg (ref 27.0–33.0)
MCHC: 32.5 g/dL (ref 32.0–36.0)
MCV: 91.3 fL (ref 80.0–100.0)
MPV: 10.1 fL (ref 7.5–12.5)
Monocytes Relative: 10 %
Neutro Abs: 10513 cells/uL — ABNORMAL HIGH (ref 1500–7800)
Neutrophils Relative %: 77.3 %
Platelets: 464 10*3/uL — ABNORMAL HIGH (ref 140–400)
RBC: 5.16 10*6/uL — ABNORMAL HIGH (ref 3.80–5.10)
RDW: 15.8 % — ABNORMAL HIGH (ref 11.0–15.0)
Total Lymphocyte: 11.6 %
WBC: 13.6 10*3/uL — ABNORMAL HIGH (ref 3.8–10.8)

## 2021-08-13 LAB — TSH: TSH: 1.86 mIU/L (ref 0.40–4.50)

## 2021-08-13 LAB — LIPID PANEL
Cholesterol: 165 mg/dL (ref <200)
HDL: 36 mg/dL — ABNORMAL LOW (ref >=50)
LDL Cholesterol (Calc): 87 mg/dL (calc)
Non-HDL Cholesterol (Calc): 129 mg/dL (calc) (ref <130)
Total CHOL/HDL Ratio: 4.6 (calc) (ref <5.0)
Triglycerides: 349 mg/dL — ABNORMAL HIGH (ref <150)

## 2021-08-14 NOTE — Progress Notes (Signed)
Established Patient Office Visit  Subjective:  [name], adult    DOB: May 04, 1956  Age: 65 y.o. MRN: [id number]  CC:  Chief Complaint  Patient presents with   Medication Management    Pt would like to review her diabetes medication.   Back Pain    Pt has a bruise on her back/ x 1 mon. She states it is painful    HPI Tammy Mcpherson presents for med management, bruise  Med management Wants to review current t2dm medications, risks, benefits, and AE. Her sugars have been slowly improving No hypoglycemia No AE that she notes  Back pain / bruise Ongoing over 2-3 weeks Painful, tender. No pain with ROM No flank pain or urinary symptoms No new lower extremity symptoms Bruising/erythematous appearance.    Past Medical History:  Diagnosis Date   Anemia 04/30/2016   Anxiety    Arthritis    Blood transfusion without reported diagnosis    Cardiomyopathy ([place])    a. EF 40-45% by echo in 04/2016 b. Improved to 60-65% by repeat imaging in 2018   COPD (chronic obstructive pulmonary disease) (HCC)    Coronary artery calcification seen on CT scan    Diabetes mellitus without complication (HCC)    Dyspnea    Essential hypertension    GERD (gastroesophageal reflux disease)    Headache    History of bronchitis    History of GI bleed    History of hiatal hernia    Hyperlipidemia    Iron deficiency anemia 05/12/2016   Bleeding ulcers   Leukocytosis 03/23/2020   Peripheral vascular disease (HCC)    Pollen allergy    PSVT (paroxysmal supraventricular tachycardia) (HCC)    PVC's (premature ventricular contractions)    Thrombocytosis 03/23/2020   Vitamin B12 deficiency 03/23/2020    Past Surgical History:  Procedure Laterality Date   ABDOMINAL AORTOGRAM W/LOWER EXTREMITY Bilateral 05/19/2021   Procedure: ABDOMINAL AORTOGRAM W/LOWER EXTREMITY;  Surgeon: [name], MD;  Location: [facility];  Service: Cardiovascular;  Laterality: Bilateral;    ABDOMINAL HYSTERECTOMY     polyps  about age 6   AIKEN OSTEOTOMY Right 07/10/2013   Procedure: Barbie Banner OSTEOTOMY RIGHT FOOT;  Surgeon: Marcheta Grammes, DPM;  Location: [facility];  Service: Orthopedics;  Laterality: Right;   AMPUTATION Left 05/09/2017   Procedure: AMPUTATION 5TH TOE LEFT FOOT;  Surgeon: [name], DPM;  Location: [facility];  Service: Podiatry;  Laterality: Left;   AMPUTATION Left 06/13/2017   Procedure: AMPUTATION 2ND TOE LEFT FOOT;  Surgeon: [name], DPM;  Location: [facility];  Service: Podiatry;  Laterality: Left;   AMPUTATION Right 06/01/2021   Procedure: Right great toe and Second Toe Amputation;  Surgeon: [name], MD;  Location: [place];  Service: Vascular;  Laterality: Right;   BUNIONECTOMY Right 07/10/2013   Procedure: Prudy Feeler BUNIONECTOMY RIGHT FOOT;  Surgeon: Marcheta Grammes, DPM;  Location: [facility];  Service: Orthopedics;  Laterality: Right;   CHOLECYSTECTOMY N/A 08/22/2017   Procedure: LAPAROSCOPIC CHOLECYSTECTOMY;  Surgeon: Virl Cagey, MD;  Location: [facility];  Service: General;  Laterality: N/A;   COLONOSCOPY N/A 07/07/2016   Dr. Oneida Alar; redundant left colon, diverticulosis at hepatic flexure, non-bleeding internal hemorrhoids   ESOPHAGOGASTRODUODENOSCOPY N/A 07/07/2016   Dr. Oneida Alar: many non-bleeding cratered gastric ulcers without stigmata of bleeding in gastric antrum. four 2-3 mm angioectasias without bleeding in duodenal bulb and second portion of duodenum s/p APC. Chroni gastritis on path.  ESOPHAGOGASTRODUODENOSCOPY N/A 08/03/2017   Dr. Oneida Alar: erosive gastritis, AVMs. Found a single non-bleeding angioectasia in stomach, s/p APC therapy. Four non-bleeding angioectasias in duodenum s/p APC. Non-bleeding erosive gastropathy   IR ANGIOGRAM EXTREMITY LEFT  04/24/2017   IR FEM POP ART ATHERECT INC PTA MOD SED  04/24/2017   IR INFUSION THROMBOL ARTERIAL INITIAL (MS)  04/24/2017   IR RADIOLOGIST EVAL & MGMT  12/05/2016   IR US GUIDE VASC  ACCESS RIGHT  04/24/2017   LIVER BIOPSY N/A 08/22/2017   Procedure: LIVER BIOPSY;  Surgeon: Virl Cagey, MD;  Location: [facility];  Service: General;  Laterality: N/A;   METATARSAL HEAD EXCISION Right 07/10/2013   Procedure: METATARSAL HEAD RESECTION OF DIGITS 2 AND 3 RIGHT FOOT;  Surgeon: Marcheta Grammes, DPM;  Location: [facility];  Service: Orthopedics;  Laterality: Right;   PERIPHERAL VASCULAR INTERVENTION Right 05/19/2021   Procedure: PERIPHERAL VASCULAR INTERVENTION;  Surgeon: [name], MD;  Location: [place] [facility];  Service: Cardiovascular;  Laterality: Right;   PROXIMAL INTERPHALANGEAL FUSION (PIP) Right 07/10/2013   Procedure: ARTHRODESIS PIPJ  2ND DIGIT RIGHT FOOT;  Surgeon: Marcheta Grammes, DPM;  Location: [facility];  Service: Orthopedics;  Laterality: Right;    Family History  Adopted: Yes  Problem Relation Age of Onset   Hypertension Father    Coronary artery disease Sister    Ulcers Maternal Grandmother    Colon cancer Neg Hx        unknown, was adopted    Social History   Socioeconomic History   Marital status: Married    Spouse name: [name]   Number of children: 1   Years of education: 12   Highest education level: Not on file  Occupational History   Occupation: disabled    Comment: heart  Tobacco Use   Smoking status: Light Smoker    Packs/day: 0.50    Years: 44.00    Pack years: 22.00    Types: Cigarettes    Start date: 05/10/1975   Smokeless tobacco: Never   Tobacco comments:    2 cigarettes per day 12/10/2019  Vaping Use   Vaping Use: Former  Substance and Sexual Activity   Alcohol use: Not Currently    Alcohol/week: 2.0 standard drinks    Types: 2 Glasses of wine per week   Drug use: No   Sexual activity: Yes    Birth control/protection: Surgical  Other Topics Concern   Not on file  Social History Narrative   Disabled   Lives with husband [name]   Two dogs   Social Determinants of Health   Financial Resource Strain: Not on  file  Food Insecurity: Not on file  Transportation Needs: Not on file  Physical Activity: Not on file  Stress: Not on file  Social Connections: Not on file  Intimate Partner Violence: Not At Risk   Fear of Current or Ex-Partner: No   Emotionally Abused: No   Physically Abused: No   Sexually Abused: No    Outpatient Medications Prior to Visit  Medication Sig Dispense Refill   acetaminophen (TYLENOL) 500 MG tablet Take 500 mg by mouth at bedtime as needed for moderate pain.     albuterol (PROVENTIL) (2.5 MG/3ML) 0.083% nebulizer solution Take 3 mLs (2.5 mg total) by nebulization every 4 (four) hours as needed for wheezing or shortness of breath. 75 mL 5   aspirin 81 MG chewable tablet Chew 81 mg by mouth every morning.      bisoprolol (  ZEBETA) 10 MG tablet TAKE ONE (1) TABLET BY MOUTH EVERY DAY 90 tablet 3   blood glucose meter kit and supplies Dispense based on patient and insurance preference. Use up to four times daily as directed. (FOR ICD-10 E10.9, E11.9). 1 each 0   Budeson-Glycopyrrol-Formoterol (BREZTRI AEROSPHERE) 160-9-4.8 MCG/ACT AERO Inhale 2 puffs into the lungs 2 (two) times daily. 11.8 g 12   Continuous Blood Gluc Receiver (FREESTYLE LIBRE 14 DAY READER) DEVI See admin instructions. 4 each 0   empagliflozin (JARDIANCE) 10 MG TABS tablet Take 10 mg by mouth daily before breakfast. 30 tablet 4   glucose blood (ONETOUCH ULTRA) test strip Use as instructed 100 each 12   glucose monitoring kit (FREESTYLE) monitoring kit Use to check blood sugar BID as directed 1 each 0   lactase (LACTAID) 3000 units tablet Take by mouth as needed.     Lancets (ONETOUCH DELICA PLUS [id number]) MISC 1 each by Does not apply route 2 (two) times a day. 100 each 3   losartan (COZAAR) 100 MG tablet TAKE ONE (1) TABLET BY MOUTH EVERY DAY 90 tablet 3   Misc. Devices MISC Please provide supplies (needle, syringe, alcohol swabs) needed for patient to self-administer B-12 injections monthly. 1 each 12    pantoprazole (PROTONIX) 40 MG tablet TAKE ONE (1) TABLET 30 MINS BEFORE YOUR FIRST MEAL. 90 tablet 3   spironolactone (ALDACTONE) 50 MG tablet Take 1 tablet (50 mg total) by mouth daily. 90 tablet 1   atorvastatin (LIPITOR) 20 MG tablet TAKE ONE TABLET (20MG TOTAL) BY MOUTH DAILY 90 tablet 3   Cholecalciferol (VITAMIN D) 50 MCG (2000 UT) CAPS Take by mouth daily.     Continuous Blood Gluc Sensor (FREESTYLE LIBRE 14 DAY SENSOR) MISC Inject 1 each into the skin See admin instructions. 4 each 0   Continuous Blood Gluc Sensor (FREESTYLE LIBRE 2 SENSOR) MISC 1 each by Does not apply route daily. 1 each 0   cyanocobalamin (,VITAMIN B-12,) 1000 MCG/ML injection INJECT 1 ML INTO THE MUSCLE EVERY 30 DAYS. 1 mL 0   dicyclomine (BENTYL) 10 MG capsule Take 1 capsule (10 mg total) by mouth as needed for spasms (30 mins before meals and at bedtime as needed for diarrhea and abdominal cramps.). Take no more than four times daily but take only as needed 120 capsule 1   FLUoxetine (PROZAC) 10 MG capsule Take 1 capsule (10 mg total) by mouth daily. After 2 weeks, increase to 2 capsules (60m total) by mouth daily. 90 capsule 0   gabapentin (NEURONTIN) 300 MG capsule TAKE ONE CAPSULE (300 MG TOTAL) BY MOUTHTWO TIMES DAILY. 180 capsule 1   metFORMIN (GLUCOPHAGE) 1000 MG tablet Take 1 tablet (1,000 mg total) by mouth 2 (two) times daily with a meal. (Patient taking differently: Take 500 mg by mouth 2 (two) times daily with a meal. Takes 500 mg twice daily.  Total of 1000 mg daily.) 180 tablet 3   predniSONE (DELTASONE) 20 MG tablet Take 20 mg by mouth daily with breakfast. (Patient not taking: Reported on 03/09/2021)     ULTICARE TUBERCULIN SAFETY SYR 25G X 1" 1 ML MISC USE TO ADMINISTER INJECTABLE VITAMIN B12. 1 each 0   valACYclovir (VALTREX) 500 MG tablet Take 500 mg by mouth 2 (two) times daily. (Patient not taking: Reported on 03/09/2021)     ALPRAZolam (XANAX) 0.5 MG tablet TAKE 1 TABLET BY MOUTH AT BEDTIME AS NEEDED FOR  ANXIETY (Patient not taking: Reported on 12/08/2020) 30 tablet 0  cyclobenzaprine (FLEXERIL) 5 MG tablet TAKE ONE TABLET BY MOUTH UP TO EVERY 8 HOURS AS NEEDED. START WITH ONE TABLET AT BEDTIME AS NEEDED DUE TO SEDATION. (Patient not taking: Reported on 12/08/2020) 15 tablet 0   No facility-administered medications prior to visit.    Allergies  Allergen Reactions   Bee Venom Swelling   Codeine Anaphylaxis    Tongue swelled   Penicillins Swelling and Rash    Has patient had a PCN reaction causing immediate rash, facial/tongue/throat swelling, SOB or lightheadedness with hypotension: No, delayed Has patient had a PCN reaction causing severe rash involving mucus membranes or skin necrosis: No Has patient had a PCN reaction that required hospitalization No Has patient had a PCN reaction occurring within the last 10 years: No If all of the above answers are "NO", then may proceed with Cephalosporin use.    Bupropion Other (See Comments)    "Made my skin crawl"   Sudafed [Pseudoephedrine Hcl] Rash    ROS Review of Systems  Constitutional: Negative.   HENT: Negative.    Eyes: Negative.   Respiratory: Negative.    Cardiovascular: Negative.   Gastrointestinal: Negative.   Genitourinary: Negative.   Musculoskeletal: Negative.   Skin: Negative.   Neurological: Negative.   Psychiatric/Behavioral: Negative.    All other systems reviewed and are negative.    Objective:    Physical Exam Constitutional:      General: She is not in acute distress.    Appearance: Normal appearance. She is normal weight. She is not ill-appearing, toxic-appearing or diaphoretic.  Cardiovascular:     Rate and Rhythm: Normal rate and regular rhythm.     Heart sounds: Normal heart sounds. No murmur heard.   No friction rub. No gallop.  Pulmonary:     Effort: Pulmonary effort is normal. No respiratory distress.     Breath sounds: Normal breath sounds. No stridor. No wheezing, rhonchi or rales.  Chest:      Chest wall: No tenderness.  Skin:    General: Skin is warm and dry.     Coloration: Skin is not jaundiced or pale.     Findings: Bruising (lacelike in appearance across lower back) present. No erythema, lesion or rash.  Neurological:     General: No focal deficit present.     Mental Status: She is alert and oriented to person, place, and time. Mental status is at baseline.  Psychiatric:        Mood and Affect: Mood normal.        Behavior: Behavior normal.        Thought Content: Thought content normal.        Judgment: Judgment normal.    BP (!) 171/86   Pulse 90   Temp 98.2 F (36.8 C) (Temporal)   Resp 16   Ht '5\' 5"'  (1.651 m)   Wt 143 lb (64.9 kg)   SpO2 98%   BMI 23.80 kg/m  Wt Readings from Last 3 Encounters:  08/12/21 127 lb (57.6 kg)  07/13/21 128 lb 4.8 oz (58.2 kg)  06/14/21 134 lb 14.4 oz (61.2 kg)     Health Maintenance Due  Topic Date Due   COVID-19 Vaccine (1) Never done   OPHTHALMOLOGY EXAM  Never done   Zoster Vaccines- Shingrix (1 of 2) Never done   PAP SMEAR-Modifier  Never done    There are no preventive care reminders to display for this patient.  Lab Results  Component Value Date   TSH 1.86  08/12/2021   Lab Results  Component Value Date   WBC 13.6 (H) 08/12/2021   HGB 15.3 08/12/2021   HCT 47.1 (H) 08/12/2021   MCV 91.3 08/12/2021   PLT 464 (H) 08/12/2021   Lab Results  Component Value Date   NA 141 08/12/2021   K 3.3 (L) 08/12/2021   CO2 28 08/12/2021   GLUCOSE 203 (H) 08/12/2021   BUN 7 08/12/2021   CREATININE 0.64 08/12/2021   BILITOT 0.3 08/12/2021   ALKPHOS 94 07/05/2021   AST 8 (L) 08/12/2021   ALT 5 (L) 08/12/2021   PROT 6.2 08/12/2021   ALBUMIN 3.1 (L) 07/05/2021   CALCIUM 9.4 08/12/2021   ANIONGAP 9 07/05/2021   GFR 94.26 04/26/2021   Lab Results  Component Value Date   CHOL 165 08/12/2021   Lab Results  Component Value Date   HDL 36 (L) 08/12/2021   Lab Results  Component Value Date   LDLCALC 87 08/12/2021    Lab Results  Component Value Date   TRIG 349 (H) 08/12/2021   Lab Results  Component Value Date   CHOLHDL 4.6 08/12/2021   Lab Results  Component Value Date   HGBA1C 6.6 (H) 08/12/2021      Assessment & Plan:   Problem List Items Addressed This Visit   None Visit Diagnoses     Burn    -  Primary       Meds ordered this encounter  Medications   DISCONTD: silver sulfADIAZINE (SILVADENE) 1 % cream    Sig: Apply 1 application topically daily.    Dispense:  50 g    Refill:  0    Order Specific Question:   Supervising Provider    Answer:   Carlota Raspberry, JEFFREY R [[id number]]    Follow-up: No follow-ups on file.   PLAN Pt has been using heating pad for long periods on lower back. Suspect some burns from this. Will give silvadene and reviewed safe use of heating pads.  Reviewed t2dm medications, risks, benefits, alternatives, and safety of each. Pt voices understanding. Return as scheduled - within 6 mo Patient encouraged to call clinic with any questions, comments, or concerns.  [name], NP

## 2021-08-17 ENCOUNTER — Other Ambulatory Visit: Payer: Self-pay | Admitting: Registered Nurse

## 2021-08-17 DIAGNOSIS — F411 Generalized anxiety disorder: Secondary | ICD-10-CM

## 2021-09-06 ENCOUNTER — Ambulatory Visit: Payer: PPO | Admitting: Physician Assistant

## 2021-09-06 ENCOUNTER — Other Ambulatory Visit: Payer: Self-pay

## 2021-09-06 ENCOUNTER — Ambulatory Visit (HOSPITAL_COMMUNITY)
Admission: RE | Admit: 2021-09-06 | Discharge: 2021-09-06 | Disposition: A | Discharge status: Home / Self Care | Payer: PPO | Source: Ambulatory Visit | Attending: Physician Assistant | Admitting: Physician Assistant

## 2021-09-06 VITALS — BP 190/82 | HR 88 | Temp 97.7°F | Resp 20 | Ht 65.0 in | Wt 126.1 lb

## 2021-09-06 DIAGNOSIS — I739 Peripheral vascular disease, unspecified: Secondary | ICD-10-CM

## 2021-09-06 NOTE — Progress Notes (Signed)
[facility] HISTORY AND PHYSICAL   History of Present Illness:  Patient is a 65 y.o. year old adult who presents for evaluation of claudication.   RLE arteriogram with right popliteal angioplasty and stenting and right AT angioplasty by Dr. Stanford Breed on 05/19/21. This was performed secondary to rest pain and gangrene of right 2nd toe.   This was followed by right first / second toe amputation on 06/01/21.    She states she is feeling well and her amputation sites is healing without problems.  She continues to be a daily smoker.   ASA, Lipitor, Plavix daily  Past Medical History:  Diagnosis Date   Anemia 04/30/2016   Anxiety    Arthritis    Blood transfusion without reported diagnosis    Cardiomyopathy ([place])    a. EF 40-45% by echo in 04/2016 b. Improved to 60-65% by repeat imaging in 2018   COPD (chronic obstructive pulmonary disease) (HCC)    Coronary artery calcification seen on CT scan    Diabetes mellitus without complication (HCC)    Dyspnea    Essential hypertension    GERD (gastroesophageal reflux disease)    Headache    History of bronchitis    History of GI bleed    History of hiatal hernia    Hyperlipidemia    Iron deficiency anemia 05/12/2016   Bleeding ulcers   Leukocytosis 03/23/2020   Peripheral vascular disease (HCC)    Pollen allergy    PSVT (paroxysmal supraventricular tachycardia) (HCC)    PVC's (premature ventricular contractions)    Thrombocytosis 03/23/2020   Vitamin B12 deficiency 03/23/2020    Past Surgical History:  Procedure Laterality Date   ABDOMINAL AORTOGRAM W/LOWER EXTREMITY Bilateral 05/19/2021   Procedure: ABDOMINAL AORTOGRAM W/LOWER EXTREMITY;  Surgeon: [name], MD;  Location: [facility];  Service: Cardiovascular;  Laterality: Bilateral;   ABDOMINAL HYSTERECTOMY     polyps  about age 33   AIKEN OSTEOTOMY Right 07/10/2013   Procedure: Barbie Banner OSTEOTOMY RIGHT FOOT;  Surgeon: Marcheta Grammes, DPM;   Location: [facility];  Service: Orthopedics;  Laterality: Right;   AMPUTATION Left 05/09/2017   Procedure: AMPUTATION 5TH TOE LEFT FOOT;  Surgeon: [name], DPM;  Location: [facility];  Service: Podiatry;  Laterality: Left;   AMPUTATION Left 06/13/2017   Procedure: AMPUTATION 2ND TOE LEFT FOOT;  Surgeon: [name], DPM;  Location: [facility];  Service: Podiatry;  Laterality: Left;   AMPUTATION Right 06/01/2021   Procedure: Right great toe and Second Toe Amputation;  Surgeon: [name], MD;  Location: [facility];  Service: Vascular;  Laterality: Right;   BUNIONECTOMY Right 07/10/2013   Procedure: Prudy Feeler BUNIONECTOMY RIGHT FOOT;  Surgeon: Marcheta Grammes, DPM;  Location: [facility];  Service: Orthopedics;  Laterality: Right;   CHOLECYSTECTOMY N/A 08/22/2017   Procedure: LAPAROSCOPIC CHOLECYSTECTOMY;  Surgeon: Virl Cagey, MD;  Location: [facility];  Service: General;  Laterality: N/A;   COLONOSCOPY N/A 07/07/2016   Dr. Oneida Alar; redundant left colon, diverticulosis at hepatic flexure, non-bleeding internal hemorrhoids   ESOPHAGOGASTRODUODENOSCOPY N/A 07/07/2016   Dr. Oneida Alar: many non-bleeding cratered gastric ulcers without stigmata of bleeding in gastric antrum. four 2-3 mm angioectasias without bleeding in duodenal bulb and second portion of duodenum s/p APC. Chroni gastritis on path.    ESOPHAGOGASTRODUODENOSCOPY N/A 08/03/2017   Dr. Oneida Alar: erosive gastritis, AVMs. Found a single non-bleeding angioectasia in stomach, s/p APC therapy. Four non-bleeding angioectasias in duodenum s/p APC. Non-bleeding erosive gastropathy  IR ANGIOGRAM EXTREMITY LEFT  04/24/2017   IR FEM POP ART ATHERECT INC PTA MOD SED  04/24/2017   IR INFUSION THROMBOL ARTERIAL INITIAL (MS)  04/24/2017   IR RADIOLOGIST EVAL & MGMT  12/05/2016   IR US GUIDE VASC ACCESS RIGHT  04/24/2017   LIVER BIOPSY N/A 08/22/2017   Procedure: LIVER BIOPSY;  Surgeon: Virl Cagey, MD;  Location: [facility];  Service: General;  Laterality:  N/A;   METATARSAL HEAD EXCISION Right 07/10/2013   Procedure: METATARSAL HEAD RESECTION OF DIGITS 2 AND 3 RIGHT FOOT;  Surgeon: Marcheta Grammes, DPM;  Location: [facility];  Service: Orthopedics;  Laterality: Right;   PERIPHERAL VASCULAR INTERVENTION Right 05/19/2021   Procedure: PERIPHERAL VASCULAR INTERVENTION;  Surgeon: [name], MD;  Location: [facility];  Service: Cardiovascular;  Laterality: Right;   PROXIMAL INTERPHALANGEAL FUSION (PIP) Right 07/10/2013   Procedure: ARTHRODESIS PIPJ  2ND DIGIT RIGHT FOOT;  Surgeon: Marcheta Grammes, DPM;  Location: [facility];  Service: Orthopedics;  Laterality: Right;    ROS:   General:  No weight loss, Fever, chills  HEENT: No recent headaches, no nasal bleeding, no visual changes, no sore throat  Neurologic: No dizziness, blackouts, seizures. No recent symptoms of stroke or mini- stroke. No recent episodes of slurred speech, or temporary blindness.  Cardiac: No recent episodes of chest pain/pressure, no shortness of breath at rest.  No shortness of breath with exertion.  Denies history of atrial fibrillation or irregular heartbeat  Vascular: No history of rest pain in feet.  No history of claudication.  No history of non-healing ulcer, No history of DVT   Pulmonary: No home oxygen, no productive cough, no hemoptysis,  No asthma or wheezing  Musculoskeletal:  '[ ]'  Arthritis, '[ ]'  Low back pain,  '[ ]'  Joint pain  Hematologic:No history of hypercoagulable state.  No history of easy bleeding.  No history of anemia  Gastrointestinal: No hematochezia or melena,  No gastroesophageal reflux, no trouble swallowing  Urinary: '[ ]'  chronic Kidney disease, '[ ]'  on HD - '[ ]'  MWF or '[ ]'  TTHS, '[ ]'  Burning with urination, '[ ]'  Frequent urination, '[ ]'  Difficulty urinating;   Skin: No rashes  Psychological: No history of anxiety,  No history of depression  Social History Social History   Tobacco Use   Smoking status: Light Smoker     Packs/day: 0.50    Years: 44.00    Pack years: 22.00    Types: Cigarettes    Start date: 05/10/1975   Smokeless tobacco: Never   Tobacco comments:    2 cigarettes per day 12/10/2019  Vaping Use   Vaping Use: Former  Substance Use Topics   Alcohol use: Not Currently    Alcohol/week: 2.0 standard drinks    Types: 2 Glasses of wine per week   Drug use: No    Family History Family History  Adopted: Yes  Problem Relation Age of Onset   Hypertension Father    Coronary artery disease Sister    Ulcers Maternal Grandmother    Colon cancer Neg Hx        unknown, was adopted    Allergies  Allergies  Allergen Reactions   Bee Venom Swelling   Codeine Anaphylaxis    Tongue swelled   Penicillins Swelling and Rash    Has patient had a PCN reaction causing immediate rash, facial/tongue/throat swelling, SOB or lightheadedness with hypotension: No, delayed Has patient had a PCN reaction causing severe rash  involving mucus membranes or skin necrosis: No Has patient had a PCN reaction that required hospitalization No Has patient had a PCN reaction occurring within the last 10 years: No If all of the above answers are "NO", then may proceed with Cephalosporin use.    Bupropion Other (See Comments)    "Made my skin crawl"   Sudafed [Pseudoephedrine Hcl] Rash     Current Outpatient Medications  Medication Sig Dispense Refill   acetaminophen (TYLENOL) 500 MG tablet Take 500 mg by mouth at bedtime as needed for moderate pain.     albuterol (PROVENTIL) (2.5 MG/3ML) 0.083% nebulizer solution Take 3 mLs (2.5 mg total) by nebulization every 4 (four) hours as needed for wheezing or shortness of breath. 75 mL 5   albuterol (VENTOLIN HFA) 108 (90 Base) MCG/ACT inhaler INHALE TWO PUFFS INTO THE LUNGS EVERY SIX HOURS AS NEEDED FOR WHEEZING OR SHORTNESS OF BREATH 8.5 g 5   ALPRAZolam (XANAX) 0.5 MG tablet TAKE 1 TABLET BY MOUTH AT BEDTIME AS NEEDED FOR ANXIETY 30 tablet 0   aspirin 81 MG chewable  tablet Chew 81 mg by mouth every morning.      bisoprolol (ZEBETA) 10 MG tablet TAKE ONE (1) TABLET BY MOUTH EVERY DAY 90 tablet 3   blood glucose meter kit and supplies Dispense based on patient and insurance preference. Use up to four times daily as directed. (FOR ICD-10 E10.9, E11.9). 1 each 0   Budeson-Glycopyrrol-Formoterol (BREZTRI AEROSPHERE) 160-9-4.8 MCG/ACT AERO Inhale 2 puffs into the lungs 2 (two) times daily. 11.8 g 12   Cholecalciferol (VITAMIN D) 50 MCG (2000 UT) CAPS Take 1 capsule (2,000 Units total) by mouth daily. 90 capsule 4   clopidogrel (PLAVIX) 75 MG tablet Take 1 tablet (75 mg total) by mouth daily with breakfast. 30 tablet 6   Continuous Blood Gluc Receiver (FREESTYLE LIBRE 14 DAY READER) DEVI See admin instructions. 4 each 0   Continuous Blood Gluc Receiver (FREESTYLE LIBRE 2 READER) DEVI Check blood glucose up to 4 times daily 1 each 0   Continuous Blood Gluc Sensor (FREESTYLE LIBRE 2 SENSOR) MISC See admin instructions.     Continuous Blood Gluc Sensor (FREESTYLE LIBRE 2 SENSOR) MISC Apply one sensor to the upper arm avery 14 days. 6 each 1   cyanocobalamin (,VITAMIN B-12,) 1000 MCG/ML injection INJECT 1 ML INTO THE MUSCLE EVERY 30 DAYS. (Patient taking differently: Inject 1,000 mcg into the muscle every 30 (thirty) days.) 1 mL 11   cyclobenzaprine (FLEXERIL) 5 MG tablet Take 1 tablet (5 mg total) by mouth 3 (three) times daily as needed for muscle spasms. 15 tablet 3   diclofenac (VOLTAREN) 75 MG EC tablet Take 1 tablet (75 mg total) by mouth 2 (two) times daily. 30 tablet 0   dicyclomine (BENTYL) 10 MG capsule Take 10 mg by mouth as needed for spasms.     empagliflozin (JARDIANCE) 10 MG TABS tablet Take 10 mg by mouth daily before breakfast. 30 tablet 4   FLUoxetine (PROZAC) 40 MG capsule Take 1 capsule (40 mg total) by mouth daily. 90 capsule 3   gabapentin (NEURONTIN) 300 MG capsule TAKE ONE CAPSULE (300 MG TOTAL) BY MOUTHTWO TIMES DAILY. 180 capsule 1   glucose  blood (ONETOUCH ULTRA) test strip Use as instructed 100 each 12   glucose monitoring kit (FREESTYLE) monitoring kit Use to check blood sugar BID as directed 1 each 0   lactase (LACTAID) 3000 units tablet Take by mouth as needed.     Lancets (  ONETOUCH DELICA PLUS [id number]) MISC 1 each by Does not apply route 2 (two) times a day. 100 each 3   losartan (COZAAR) 100 MG tablet TAKE ONE (1) TABLET BY MOUTH EVERY DAY 90 tablet 3   metFORMIN (GLUCOPHAGE) 1000 MG tablet Take 0.5 tablets (500 mg total) by mouth 2 (two) times daily with a meal. Takes 500 mg twice daily.  Total of 1000 mg daily. (Patient taking differently: Take 500 mg by mouth 2 (two) times daily with a meal. Takes 500 mg twice daily.  Total of 1000 mg daily.) 180 tablet 3   Misc. Devices MISC Please provide supplies (needle, syringe, alcohol swabs) needed for patient to self-administer B-12 injections monthly. 1 each 12   pantoprazole (PROTONIX) 40 MG tablet TAKE ONE (1) TABLET 30 MINS BEFORE YOUR FIRST MEAL. 90 tablet 3   spironolactone (ALDACTONE) 50 MG tablet Take 1 tablet (50 mg total) by mouth daily. 90 tablet 1   ULTICARE TUBERCULIN SAFETY SYR 25G X 1" 1 ML MISC USE TO ADMINISTER INJECTABLE VITAMIN B12. 1 each 0   atorvastatin (LIPITOR) 40 MG tablet Take 1 tablet (40 mg total) by mouth daily. 30 tablet 2   No current facility-administered medications for this visit.    Physical Examination  Vitals:   09/06/21 1108  BP: (!) 190/82  Pulse: 88  Resp: 20  Temp: 97.7 F (36.5 C)  TempSrc: Temporal  SpO2: 93%  Weight: 126 lb 1.6 oz (57.2 kg)  Height: '5\' 5"'  (1.651 m)    Body mass index is 20.98 kg/m.  General:  Alert and oriented, no acute distress HEENT: Normal Neck: No bruit or JVD Pulmonary: Clear to auscultation bilaterally Cardiac: Regular Rate and Rhythm without murmur Abdomen: Soft, non-tender, non-distended, no mass, no scars Skin: No rash    Extremity Pulses:  2+ radial, brachial, femoral, DP pulses  bilaterally Musculoskeletal: No deformity or edema  Neurologic: Upper and lower extremity motor 5/5 and symmetric  DATA:   +-----------+--------+-----+--------+--------+--------+  RIGHT      PSV cm/sRatioStenosisWaveformComments  +-----------+--------+-----+--------+--------+--------+  CFA Prox   153                  biphasic          +-----------+--------+-----+--------+--------+--------+  DFA        161                  biphasic          +-----------+--------+-----+--------+--------+--------+  SFA Prox   106                                    +-----------+--------+-----+--------+--------+--------+  SFA Mid    152                                    +-----------+--------+-----+--------+--------+--------+  POP Prox   87                                     +-----------+--------+-----+--------+--------+--------+  POP Distal 60                                     +-----------+--------+-----+--------+--------+--------+  ATA Distal 51  biphasic          +-----------+--------+-----+--------+--------+--------+  PTA Distal 57                   biphasic          +-----------+--------+-----+--------+--------+--------+  PERO Distal26                   biphasic          +-----------+--------+-----+--------+--------+--------+        Right Stent(s):  +---------------------+--------+--------+--------+--------+  distal SFA- prox popAPSV cm/sStenosisWaveformComments  +---------------------+--------+--------+--------+--------+  Prox to Stent        152             biphasic          +---------------------+--------+--------+--------+--------+  Proximal Stent       173             biphasic          +---------------------+--------+--------+--------+--------+  Mid Stent            59              biphasic          +---------------------+--------+--------+--------+--------+  Distal Stent          64              biphasic          +---------------------+--------+--------+--------+--------+  Distal to Stent      87              biphasic          +---------------------+--------+--------+--------+--------+    Summary:  Right: Patent stent with no evidence of stenosis is the right distal SFA  and proximal popliteal artery.       ABI Findings:  +---------+------------------+-----+-----------+--------+  Right    Rt Pressure (mmHg)IndexWaveform   Comment   +---------+------------------+-----+-----------+--------+  Brachial 215                                         +---------+------------------+-----+-----------+--------+  PTA      200               0.90 multiphasic          +---------+------------------+-----+-----------+--------+  DP       197               0.89 multiphasic          +---------+------------------+-----+-----------+--------+  Great Toe                       Absent               +---------+------------------+-----+-----------+--------+   +---------+------------------+-----+-----------+-------+  Left     Lt Pressure (mmHg)IndexWaveform   Comment  +---------+------------------+-----+-----------+-------+  Brachial 222                                        +---------+------------------+-----+-----------+-------+  PTA      197               0.89 multiphasic         +---------+------------------+-----+-----------+-------+  DP       215               0.97 multiphasic         +---------+------------------+-----+-----------+-------+  Great Toe                       Abnormal            +---------+------------------+-----+-----------+-------+      TOES Findings:      +---------+---------------+--------+---------------------------------------  ---+  Left ToesPressure (mmHg)WaveformComment                                        +---------+---------------+--------+---------------------------------------  ---+  1st Digit               Abnormaldiminished wave, unable to obtain  pressure  +---------+---------------+--------+---------------------------------------  ---+     Right ABIs appear increased compared to prior study on 06/14/21. Left ABIs  appear increased compared to prior study on 06/14/21.     Summary:  Right: Resting right ankle-brachial index indicates mild right lower  extremity arterial disease.   Left: Resting left ankle-brachial index is within normal range. No  evidence of significant left lower extremity arterial disease.   ASSESSMENT/PLAN:  PAD  with history rest pain and gangrene of right 2nd toe.  She is able to ambulate better, denise rest pain or new non healing wounds.   Her Duplex today shows adequate perfusion following recent revascularization and the ABI's are improved since surgery.  The amputation site is well healed with a small medial area of ruff dry skin.  She has been covering it with a band aide. Shower daily, lotion for dry skin and start a walking program.  I also suggested she stop smoking.  F/U in 9 months for ABI's.  If she has problems or concerns she will call our office.       Ruta Hinds, MD [facility] Office: [phone] Pager: [phone]

## 2021-09-07 ENCOUNTER — Other Ambulatory Visit: Payer: Self-pay | Admitting: Internal Medicine

## 2021-09-07 ENCOUNTER — Other Ambulatory Visit: Payer: Self-pay | Admitting: *Deleted

## 2021-09-07 DIAGNOSIS — I739 Peripheral vascular disease, unspecified: Secondary | ICD-10-CM

## 2021-09-07 DIAGNOSIS — K31819 Angiodysplasia of stomach and duodenum without bleeding: Secondary | ICD-10-CM

## 2021-09-07 DIAGNOSIS — K279 Peptic ulcer, site unspecified, unspecified as acute or chronic, without hemorrhage or perforation: Secondary | ICD-10-CM

## 2021-09-08 ENCOUNTER — Telehealth: Payer: Self-pay | Admitting: Gastroenterology

## 2021-09-08 DIAGNOSIS — K279 Peptic ulcer, site unspecified, unspecified as acute or chronic, without hemorrhage or perforation: Secondary | ICD-10-CM

## 2021-09-08 DIAGNOSIS — K31819 Angiodysplasia of stomach and duodenum without bleeding: Secondary | ICD-10-CM

## 2021-09-08 NOTE — Telephone Encounter (Signed)
Message already sent to refill team and to the pt.

## 2021-09-08 NOTE — Telephone Encounter (Signed)
PATIENT NEEDS HER PANTOPROZOLE SENT FOR REFILL

## 2021-09-09 MED ORDER — PANTOPRAZOLE SODIUM 40 MG PO TBEC
DELAYED_RELEASE_TABLET | ORAL | 3 refills | Status: DC
Start: 2021-09-09 — End: 2022-02-09

## 2021-09-12 NOTE — Progress Notes (Signed)
Referring Provider: [name], NP Primary Care Physician:  [name], NP Primary GI: Dr. Abbey Chatters   Chief Complaint  Patient presents with   Gastroesophageal Reflux    ok   Diarrhea    Not as much    HPI:   Tammy Mcpherson is a 65 y.o. adult presenting today with a history of chronic diarrhea felt to be multifactorial in setting of IBS-D,bile salt induced diarrhea, and lactose intolerance, PUD in 2017 and gastric/duodenal AVMs s/p ablation 2018, IDA followed by Hematology, last colonoscopy in 2017 with diverticulosis. Negative celiac serologies.   Chronically on metformin: recommended a drug holiday. She is actually not on metformin any longer.   Xifaxan: too expensive. Was not able to take.   Diarrhea feels improved from previously. Diarrhea is once a week. Takes dicyclomine 10 mg once every week. Much improved.   GERD: Protonix once daily. No dysphagia.   No abdominal pain.    Past Medical History:  Diagnosis Date   Anemia 04/30/2016   Anxiety    Arthritis    Blood transfusion without reported diagnosis    Cardiomyopathy ([place])    a. EF 40-45% by echo in 04/2016 b. Improved to 60-65% by repeat imaging in 2018   COPD (chronic obstructive pulmonary disease) (HCC)    Coronary artery calcification seen on CT scan    Diabetes mellitus without complication (HCC)    Dyspnea    Essential hypertension    GERD (gastroesophageal reflux disease)    Headache    History of bronchitis    History of GI bleed    History of hiatal hernia    Hyperlipidemia    Iron deficiency anemia 05/12/2016   Bleeding ulcers   Leukocytosis 03/23/2020   Peripheral vascular disease (HCC)    Pollen allergy    PSVT (paroxysmal supraventricular tachycardia) (HCC)    PVC's (premature ventricular contractions)    Thrombocytosis 03/23/2020   Vitamin B12 deficiency 03/23/2020    Past Surgical History:  Procedure Laterality Date   ABDOMINAL AORTOGRAM W/LOWER EXTREMITY Bilateral  05/19/2021   Procedure: ABDOMINAL AORTOGRAM W/LOWER EXTREMITY;  Surgeon: [name], MD;  Location: [facility];  Service: Cardiovascular;  Laterality: Bilateral;   ABDOMINAL HYSTERECTOMY     polyps  about age 45   AIKEN OSTEOTOMY Right 07/10/2013   Procedure: Barbie Banner OSTEOTOMY RIGHT FOOT;  Surgeon: Marcheta Grammes, DPM;  Location: [facility];  Service: Orthopedics;  Laterality: Right;   AMPUTATION Left 05/09/2017   Procedure: AMPUTATION 5TH TOE LEFT FOOT;  Surgeon: [name], DPM;  Location: [facility];  Service: Podiatry;  Laterality: Left;   AMPUTATION Left 06/13/2017   Procedure: AMPUTATION 2ND TOE LEFT FOOT;  Surgeon: [name], DPM;  Location: [facility];  Service: Podiatry;  Laterality: Left;   AMPUTATION Right 06/01/2021   Procedure: Right great toe and Second Toe Amputation;  Surgeon: [name], MD;  Location: [facility];  Service: Vascular;  Laterality: Right;   BUNIONECTOMY Right 07/10/2013   Procedure: Prudy Feeler BUNIONECTOMY RIGHT FOOT;  Surgeon: Marcheta Grammes, DPM;  Location: [facility];  Service: Orthopedics;  Laterality: Right;   CHOLECYSTECTOMY N/A 08/22/2017   Procedure: LAPAROSCOPIC CHOLECYSTECTOMY;  Surgeon: Virl Cagey, MD;  Location: [facility];  Service: General;  Laterality: N/A;   COLONOSCOPY N/A 07/07/2016   Dr. Oneida Alar; redundant left colon, diverticulosis at hepatic flexure, non-bleeding internal hemorrhoids   ESOPHAGOGASTRODUODENOSCOPY N/A 07/07/2016   Dr. Oneida Alar: many non-bleeding cratered gastric ulcers without  stigmata of bleeding in gastric antrum. four 2-3 mm angioectasias without bleeding in duodenal bulb and second portion of duodenum s/p APC. Chroni gastritis on path.    ESOPHAGOGASTRODUODENOSCOPY N/A 08/03/2017   Dr. Oneida Alar: erosive gastritis, AVMs. Found a single non-bleeding angioectasia in stomach, s/p APC therapy. Four non-bleeding angioectasias in duodenum s/p APC. Non-bleeding erosive gastropathy   IR ANGIOGRAM EXTREMITY LEFT   04/24/2017   IR FEM POP ART ATHERECT INC PTA MOD SED  04/24/2017   IR INFUSION THROMBOL ARTERIAL INITIAL (MS)  04/24/2017   IR RADIOLOGIST EVAL & MGMT  12/05/2016   IR US GUIDE VASC ACCESS RIGHT  04/24/2017   LIVER BIOPSY N/A 08/22/2017   Procedure: LIVER BIOPSY;  Surgeon: Virl Cagey, MD;  Location: [facility];  Service: General;  Laterality: N/A;   METATARSAL HEAD EXCISION Right 07/10/2013   Procedure: METATARSAL HEAD RESECTION OF DIGITS 2 AND 3 RIGHT FOOT;  Surgeon: Marcheta Grammes, DPM;  Location: [facility];  Service: Orthopedics;  Laterality: Right;   PERIPHERAL VASCULAR INTERVENTION Right 05/19/2021   Procedure: PERIPHERAL VASCULAR INTERVENTION;  Surgeon: [name], MD;  Location: [facility];  Service: Cardiovascular;  Laterality: Right;   PROXIMAL INTERPHALANGEAL FUSION (PIP) Right 07/10/2013   Procedure: ARTHRODESIS PIPJ  2ND DIGIT RIGHT FOOT;  Surgeon: Marcheta Grammes, DPM;  Location: [facility];  Service: Orthopedics;  Laterality: Right;    Current Outpatient Medications  Medication Sig Dispense Refill   acetaminophen (TYLENOL) 500 MG tablet Take 500 mg by mouth at bedtime as needed for moderate pain.     albuterol (PROVENTIL) (2.5 MG/3ML) 0.083% nebulizer solution Take 3 mLs (2.5 mg total) by nebulization every 4 (four) hours as needed for wheezing or shortness of breath. 75 mL 5   albuterol (VENTOLIN HFA) 108 (90 Base) MCG/ACT inhaler INHALE TWO PUFFS INTO THE LUNGS EVERY SIX HOURS AS NEEDED FOR WHEEZING OR SHORTNESS OF BREATH 8.5 g 5   ALPRAZolam (XANAX) 0.5 MG tablet TAKE 1 TABLET BY MOUTH AT BEDTIME AS NEEDED FOR ANXIETY 30 tablet 0   aspirin 81 MG chewable tablet Chew 81 mg by mouth every morning.      atorvastatin (LIPITOR) 40 MG tablet Take 1 tablet (40 mg total) by mouth daily. 30 tablet 2   bisoprolol (ZEBETA) 10 MG tablet TAKE ONE (1) TABLET BY MOUTH EVERY DAY 90 tablet 3   blood glucose meter kit and supplies Dispense based on patient and insurance  preference. Use up to four times daily as directed. (FOR ICD-10 E10.9, E11.9). 1 each 0   Budeson-Glycopyrrol-Formoterol (BREZTRI AEROSPHERE) 160-9-4.8 MCG/ACT AERO Inhale 2 puffs into the lungs 2 (two) times daily. 11.8 g 12   Cholecalciferol (VITAMIN D) 50 MCG (2000 UT) CAPS Take 1 capsule (2,000 Units total) by mouth daily. 90 capsule 4   clopidogrel (PLAVIX) 75 MG tablet Take 1 tablet (75 mg total) by mouth daily with breakfast. 30 tablet 6   Continuous Blood Gluc Receiver (FREESTYLE LIBRE 14 DAY READER) DEVI See admin instructions. 4 each 0   Continuous Blood Gluc Receiver (FREESTYLE LIBRE 2 READER) DEVI Check blood glucose up to 4 times daily 1 each 0   Continuous Blood Gluc Sensor (FREESTYLE LIBRE 2 SENSOR) MISC See admin instructions.     Continuous Blood Gluc Sensor (FREESTYLE LIBRE 2 SENSOR) MISC Apply one sensor to the upper arm avery 14 days. 6 each 1   cyanocobalamin (,VITAMIN B-12,) 1000 MCG/ML injection INJECT 1 ML INTO THE MUSCLE EVERY 30 DAYS. (Patient  taking differently: Inject 1,000 mcg into the muscle every 30 (thirty) days.) 1 mL 11   cyclobenzaprine (FLEXERIL) 5 MG tablet Take 1 tablet (5 mg total) by mouth 3 (three) times daily as needed for muscle spasms. 15 tablet 3   diclofenac (VOLTAREN) 75 MG EC tablet Take 1 tablet (75 mg total) by mouth 2 (two) times daily. 30 tablet 0   dicyclomine (BENTYL) 10 MG capsule Take 10 mg by mouth as needed for spasms.     empagliflozin (JARDIANCE) 10 MG TABS tablet Take 10 mg by mouth daily before breakfast. 30 tablet 4   FLUoxetine (PROZAC) 40 MG capsule Take 1 capsule (40 mg total) by mouth daily. 90 capsule 3   gabapentin (NEURONTIN) 300 MG capsule TAKE ONE CAPSULE (300 MG TOTAL) BY MOUTHTWO TIMES DAILY. 180 capsule 1   glucose blood (ONETOUCH ULTRA) test strip Use as instructed 100 each 12   glucose monitoring kit (FREESTYLE) monitoring kit Use to check blood sugar BID as directed 1 each 0   lactase (LACTAID) 3000 units tablet Take by  mouth as needed.     Lancets (ONETOUCH DELICA PLUS [id number]) MISC 1 each by Does not apply route 2 (two) times a day. 100 each 3   losartan (COZAAR) 100 MG tablet TAKE ONE (1) TABLET BY MOUTH EVERY DAY 90 tablet 3   metFORMIN (GLUCOPHAGE) 1000 MG tablet Take 0.5 tablets (500 mg total) by mouth 2 (two) times daily with a meal. Takes 500 mg twice daily.  Total of 1000 mg daily. (Patient taking differently: Take 500 mg by mouth 2 (two) times daily with a meal. Takes 500 mg twice daily.  Total of 1000 mg daily.) 180 tablet 3   Misc. Devices MISC Please provide supplies (needle, syringe, alcohol swabs) needed for patient to self-administer B-12 injections monthly. 1 each 12   pantoprazole (PROTONIX) 40 MG tablet TAKE ONE (1) TABLET 30 MINS BEFORE YOUR FIRST MEAL. 90 tablet 3   spironolactone (ALDACTONE) 50 MG tablet Take 1 tablet (50 mg total) by mouth daily. 90 tablet 1   ULTICARE TUBERCULIN SAFETY SYR 25G X 1" 1 ML MISC USE TO ADMINISTER INJECTABLE VITAMIN B12. 1 each 0   No current facility-administered medications for this visit.    Allergies as of 09/13/2021 - Review Complete 09/13/2021  Allergen Reaction Noted   Bee venom Swelling 05/19/2016   Codeine Anaphylaxis 04/30/2016   Penicillins Swelling and Rash 03/07/2012   Bupropion Other (See Comments) 10/04/2017   Sudafed [pseudoephedrine hcl] Rash 06/27/2013    Family History  Adopted: Yes  Problem Relation Age of Onset   Hypertension Father    Coronary artery disease Sister    Ulcers Maternal Grandmother    Colon cancer Neg Hx        unknown, was adopted    Social History   Socioeconomic History   Marital status: Married    Spouse name: Tammy Mcpherson   Number of children: 1   Years of education: 12   Highest education level: Not on file  Occupational History   Occupation: disabled    Comment: heart  Tobacco Use   Smoking status: Light Smoker    Packs/day: 0.50    Years: 44.00    Pack years: 22.00    Types: Cigarettes    Start  date: 05/10/1975   Smokeless tobacco: Never   Tobacco comments:    2 cigarettes per day 12/10/2019  Vaping Use   Vaping Use: Former  Substance and Sexual Activity  Alcohol use: Not Currently    Alcohol/week: 2.0 standard drinks    Types: 2 Glasses of wine per week   Drug use: No   Sexual activity: Yes    Birth control/protection: Surgical  Other Topics Concern   Not on file  Social History Narrative   Disabled   Lives with husband Tammy Mcpherson   Two dogs   Social Determinants of Health   Financial Resource Strain: Not on file  Food Insecurity: Not on file  Transportation Needs: Not on file  Physical Activity: Not on file  Stress: Not on file  Social Connections: Not on file    Review of Systems: Gen: Denies fever, chills, anorexia. Denies fatigue, weakness, weight loss.  CV: Denies chest pain, palpitations, syncope, peripheral edema, and claudication. Resp: Denies dyspnea at rest, cough, wheezing, coughing up blood, and pleurisy. GI: see HPI Derm: Denies rash, itching, dry skin Psych: Denies depression, anxiety, memory loss, confusion. No homicidal or suicidal ideation.  Heme: Denies bruising, bleeding, and enlarged lymph nodes.  Physical Exam: BP (!) 162/78   Pulse 80   Temp (!) 96.8 F (36 C) (Temporal)   Ht _0  (1.651 m)   Wt 125 lb 9.6 oz (57 kg)   BMI 20.90 kg/m  General:   Alert and oriented. No distress noted. Pleasant and cooperative.  Head:  Normocephalic and atraumatic. Eyes:  Conjuctiva clear without scleral icterus. Mouth:  mask in place Abdomen:  +BS, soft, non-tender and non-distended. No rebound or guarding. No HSM or masses noted. Msk:  Symmetrical without gross deformities. Normal posture. Extremities:  Without edema. Neurologic:  Alert and  oriented x4 Psych:  Alert and cooperative. Normal mood and affect.  ASSESSMENT: MCKYLA DECKMAN is a 65 y.o. adult presenting today with a history of chronic diarrhea felt to be multifactorial in setting of  IBS-D,bile salt induced diarrhea, and lactose intolerance, PUD in 2017 and gastric/duodenal AVMs s/p ablation 2018, IDA followed by Hematology, last colonoscopy in 2017 with diverticulosis. Negative celiac serologies.   Diarrhea: doing well and much improved off metformin. Continues with dicyclomine supportively approximately once per week.   GERD: well-controlled with Protonix once daily.     PLAN:  Continue dicyclomine as needed PPI daily Call if concerns Return in 1 year Colonoscopy 2027  Annitta Needs, PhD, ANP-BC [facility]

## 2021-09-13 ENCOUNTER — Other Ambulatory Visit: Payer: Self-pay

## 2021-09-13 ENCOUNTER — Encounter: Payer: Self-pay | Admitting: Gastroenterology

## 2021-09-13 ENCOUNTER — Ambulatory Visit: Payer: PPO | Admitting: Gastroenterology

## 2021-09-13 VITALS — BP 162/78 | HR 80 | Temp 96.8°F | Ht 65.0 in | Wt 125.6 lb

## 2021-09-13 DIAGNOSIS — R197 Diarrhea, unspecified: Secondary | ICD-10-CM

## 2021-09-13 DIAGNOSIS — K219 Gastro-esophageal reflux disease without esophagitis: Secondary | ICD-10-CM | POA: Diagnosis not present

## 2021-09-13 NOTE — Patient Instructions (Addendum)
I am glad you are doing well!  We will see you in 1 year or sooner if needed!  Have a wonderful Christmas!  I enjoyed seeing you again today! As you know, I value our relationship and want to provide genuine, compassionate, and quality care. I welcome your feedback. If you receive a survey regarding your visit,  I greatly appreciate you taking time to fill this out. See you next time!  Annitta Needs, PhD, ANP-BC [facility]

## 2021-09-14 ENCOUNTER — Encounter: Payer: Self-pay | Admitting: Registered Nurse

## 2021-09-14 MED ORDER — ATORVASTATIN CALCIUM 40 MG PO TABS: 40.0000 mg | ORAL_TABLET | Freq: Every day | ORAL | 1 refills | Status: DC

## 2021-10-04 ENCOUNTER — Encounter: Payer: Self-pay | Admitting: Registered Nurse

## 2021-10-04 ENCOUNTER — Other Ambulatory Visit: Payer: Self-pay | Admitting: Registered Nurse

## 2021-10-04 ENCOUNTER — Other Ambulatory Visit: Payer: Self-pay | Admitting: Cardiology

## 2021-10-04 ENCOUNTER — Encounter: Payer: Self-pay | Admitting: Cardiology

## 2021-10-04 ENCOUNTER — Other Ambulatory Visit: Payer: Self-pay

## 2021-10-04 DIAGNOSIS — F411 Generalized anxiety disorder: Secondary | ICD-10-CM

## 2021-10-04 MED ORDER — ALPRAZOLAM 0.5 MG PO TABS: ORAL_TABLET | ORAL | 0 refills | Status: DC

## 2021-10-04 NOTE — Progress Notes (Unsigned)
LOV 08/12/2021 Upcoming appt 02/21/2022

## 2021-10-05 ENCOUNTER — Other Ambulatory Visit: Payer: Self-pay | Admitting: Registered Nurse

## 2021-10-05 DIAGNOSIS — F411 Generalized anxiety disorder: Secondary | ICD-10-CM

## 2021-10-05 MED ORDER — ALPRAZOLAM 0.5 MG PO TABS: ORAL_TABLET | ORAL | 0 refills | Status: DC

## 2021-10-05 NOTE — Telephone Encounter (Signed)
Fixed  Thanks,  Denice Paradise

## 2021-10-25 ENCOUNTER — Encounter: Payer: Self-pay | Admitting: Registered Nurse

## 2021-10-26 NOTE — Telephone Encounter (Signed)
Called patient to discuss prescription she she have one more refill available. LVM to return call.

## 2021-10-30 ENCOUNTER — Encounter: Payer: Self-pay | Admitting: Registered Nurse

## 2021-10-31 ENCOUNTER — Other Ambulatory Visit: Payer: Self-pay | Admitting: Cardiology

## 2021-10-31 ENCOUNTER — Other Ambulatory Visit: Payer: Self-pay

## 2021-10-31 DIAGNOSIS — E1140 Type 2 diabetes mellitus with diabetic neuropathy, unspecified: Secondary | ICD-10-CM

## 2021-10-31 MED ORDER — GABAPENTIN 300 MG PO CAPS: ORAL_CAPSULE | ORAL | 1 refills | Status: DC

## 2021-10-31 NOTE — Telephone Encounter (Signed)
Patients medication has been sent to the pharmacy.

## 2021-11-14 ENCOUNTER — Other Ambulatory Visit (HOSPITAL_COMMUNITY): Payer: PPO

## 2021-11-15 ENCOUNTER — Inpatient Hospital Stay (HOSPITAL_COMMUNITY): Payer: PPO | Attending: Hematology

## 2021-11-15 DIAGNOSIS — Z9049 Acquired absence of other specified parts of digestive tract: Secondary | ICD-10-CM | POA: Diagnosis not present

## 2021-11-15 DIAGNOSIS — I739 Peripheral vascular disease, unspecified: Secondary | ICD-10-CM | POA: Insufficient documentation

## 2021-11-15 DIAGNOSIS — R5383 Other fatigue: Secondary | ICD-10-CM | POA: Diagnosis not present

## 2021-11-15 DIAGNOSIS — D72829 Elevated white blood cell count, unspecified: Secondary | ICD-10-CM | POA: Diagnosis not present

## 2021-11-15 DIAGNOSIS — D500 Iron deficiency anemia secondary to blood loss (chronic): Secondary | ICD-10-CM

## 2021-11-15 DIAGNOSIS — Z885 Allergy status to narcotic agent status: Secondary | ICD-10-CM | POA: Diagnosis not present

## 2021-11-15 DIAGNOSIS — E538 Deficiency of other specified B group vitamins: Secondary | ICD-10-CM | POA: Insufficient documentation

## 2021-11-15 DIAGNOSIS — Z8719 Personal history of other diseases of the digestive system: Secondary | ICD-10-CM | POA: Diagnosis not present

## 2021-11-15 DIAGNOSIS — J449 Chronic obstructive pulmonary disease, unspecified: Secondary | ICD-10-CM | POA: Insufficient documentation

## 2021-11-15 DIAGNOSIS — Z79899 Other long term (current) drug therapy: Secondary | ICD-10-CM | POA: Insufficient documentation

## 2021-11-15 DIAGNOSIS — F17210 Nicotine dependence, cigarettes, uncomplicated: Secondary | ICD-10-CM | POA: Diagnosis not present

## 2021-11-15 DIAGNOSIS — Z880 Allergy status to penicillin: Secondary | ICD-10-CM | POA: Insufficient documentation

## 2021-11-15 DIAGNOSIS — Z8249 Family history of ischemic heart disease and other diseases of the circulatory system: Secondary | ICD-10-CM | POA: Diagnosis not present

## 2021-11-15 DIAGNOSIS — Z7982 Long term (current) use of aspirin: Secondary | ICD-10-CM | POA: Insufficient documentation

## 2021-11-15 DIAGNOSIS — D75839 Thrombocytosis, unspecified: Secondary | ICD-10-CM | POA: Insufficient documentation

## 2021-11-15 DIAGNOSIS — D509 Iron deficiency anemia, unspecified: Secondary | ICD-10-CM | POA: Diagnosis not present

## 2021-11-15 DIAGNOSIS — K219 Gastro-esophageal reflux disease without esophagitis: Secondary | ICD-10-CM | POA: Insufficient documentation

## 2021-11-15 DIAGNOSIS — E559 Vitamin D deficiency, unspecified: Secondary | ICD-10-CM | POA: Insufficient documentation

## 2021-11-15 DIAGNOSIS — Z7902 Long term (current) use of antithrombotics/antiplatelets: Secondary | ICD-10-CM | POA: Insufficient documentation

## 2021-11-15 DIAGNOSIS — Z91030 Bee allergy status: Secondary | ICD-10-CM | POA: Diagnosis not present

## 2021-11-15 LAB — CBC WITH DIFFERENTIAL/PLATELET
Abs Immature Granulocytes: 0.06 10*3/uL (ref 0.00–0.07)
Basophils Absolute: 0.1 10*3/uL (ref 0.0–0.1)
Basophils Relative: 0 %
Eosinophils Absolute: 0.2 10*3/uL (ref 0.0–0.5)
Eosinophils Relative: 1 %
HCT: 44.5 % (ref 36.0–46.0)
Hemoglobin: 14.3 g/dL (ref 12.0–15.0)
Immature Granulocytes: 1 %
Lymphocytes Relative: 13 %
Lymphs Abs: 1.5 10*3/uL (ref 0.7–4.0)
MCH: 31.4 pg (ref 26.0–34.0)
MCHC: 32.1 g/dL (ref 30.0–36.0)
MCV: 97.8 fL (ref 80.0–100.0)
Monocytes Absolute: 1.3 10*3/uL — ABNORMAL HIGH (ref 0.1–1.0)
Monocytes Relative: 11 %
Neutro Abs: 8.7 10*3/uL — ABNORMAL HIGH (ref 1.7–7.7)
Neutrophils Relative %: 74 %
Platelets: 420 10*3/uL — ABNORMAL HIGH (ref 150–400)
RBC: 4.55 MIL/uL (ref 3.87–5.11)
RDW: 13.7 % (ref 11.5–15.5)
WBC: 11.8 10*3/uL — ABNORMAL HIGH (ref 4.0–10.5)
nRBC: 0 % (ref 0.0–0.2)

## 2021-11-15 LAB — IRON AND TIBC
Iron: 64 ug/dL (ref 28–170)
Saturation Ratios: 22 % (ref 10.4–31.8)
TIBC: 297 ug/dL (ref 250–450)
UIBC: 233 ug/dL

## 2021-11-15 LAB — FERRITIN: Ferritin: 78 ng/mL (ref 11–307)

## 2021-11-20 ENCOUNTER — Encounter: Payer: Self-pay | Admitting: Registered Nurse

## 2021-11-21 ENCOUNTER — Other Ambulatory Visit: Payer: Self-pay | Admitting: Registered Nurse

## 2021-11-21 ENCOUNTER — Other Ambulatory Visit: Payer: Self-pay

## 2021-11-21 ENCOUNTER — Ambulatory Visit (HOSPITAL_COMMUNITY): Payer: PPO | Admitting: Hematology

## 2021-11-21 DIAGNOSIS — F411 Generalized anxiety disorder: Secondary | ICD-10-CM

## 2021-11-21 NOTE — Progress Notes (Signed)
Tammy Mcpherson, [place] [address]   CLINIC:  Medical Oncology/Hematology  PCP:  [name], NP [address] [place] [employer] [place] [address] [phone]   REASON FOR VISIT:  Follow-up for iron deficiency anemia  PRIOR THERAPY: None  CURRENT THERAPY: Intermittent Feraheme  INTERVAL HISTORY:  Tammy Mcpherson 66 y.o. adult returns for routine follow-up of her iron deficiency anemia.  She was last seen by Dr. Delton Coombes on 07/13/2021.  Last IV iron was Feraheme x2 on 07/19/2021 and 07/26/2021.  At today's visit, she reports feeling fairly well.  She continues to take both Plavix and aspirin for her peripheral arterial disease.  She notices occasional scant rectal bleeding after a bowel movement, but denies any frank hematochezia, melena, epistaxis, or hematemesis.  She has chronic fatigue that is no worse than usual.  She denies any pica, headaches, lightheadedness, syncope, chest pain, dyspnea on exertion.  She denies any B symptoms such as fever, chills, night sweats, unintentional weight loss.  She continues to take her monthly vitamin B12 injection as well as daily vitamin D supplement.  She was previously on iron tablets and tolerated these well, but these were discontinued several years ago.  She has 75% energy and 75% appetite. She endorses that she is maintaining a stable weight.   REVIEW OF SYSTEMS:  Review of Systems  Constitutional:  Positive for fatigue. Negative for appetite change, chills, diaphoresis, fever and unexpected weight change.  HENT:   Negative for lump/mass and nosebleeds.   Eyes:  Negative for eye problems.  Respiratory:  Negative for cough, hemoptysis and shortness of breath.   Cardiovascular:  Negative for chest pain, leg swelling and palpitations.  Gastrointestinal:  Negative for abdominal pain, blood in stool, constipation, diarrhea, nausea and vomiting.  Genitourinary:  Negative for hematuria.   Skin: Negative.    Neurological:  Negative for dizziness, headaches and light-headedness.  Hematological:  Does not bruise/bleed easily.  Psychiatric/Behavioral:  The patient is nervous/anxious.      PAST MEDICAL/SURGICAL HISTORY:  Past Medical History:  Diagnosis Date   Anemia 04/30/2016   Anxiety    Arthritis    Blood transfusion without reported diagnosis    Cardiomyopathy ([place])    a. EF 40-45% by echo in 04/2016 b. Improved to 60-65% by repeat imaging in 2018   COPD (chronic obstructive pulmonary disease) (HCC)    Coronary artery calcification seen on CT scan    Diabetes mellitus without complication (HCC)    Dyspnea    Essential hypertension    GERD (gastroesophageal reflux disease)    Headache    History of bronchitis    History of GI bleed    History of hiatal hernia    Hyperlipidemia    Iron deficiency anemia 05/12/2016   Bleeding ulcers   Leukocytosis 03/23/2020   Peripheral vascular disease (HCC)    Pollen allergy    PSVT (paroxysmal supraventricular tachycardia) (HCC)    PVC's (premature ventricular contractions)    Thrombocytosis 03/23/2020   Vitamin B12 deficiency 03/23/2020   Past Surgical History:  Procedure Laterality Date   ABDOMINAL AORTOGRAM W/LOWER EXTREMITY Bilateral 05/19/2021   Procedure: ABDOMINAL AORTOGRAM W/LOWER EXTREMITY;  Surgeon: [name], MD;  Location: [facility];  Service: Cardiovascular;  Laterality: Bilateral;   ABDOMINAL HYSTERECTOMY     polyps  about age 69   AIKEN OSTEOTOMY Right 07/10/2013   Procedure: Barbie Banner OSTEOTOMY RIGHT FOOT;  Surgeon: Marcheta Grammes, DPM;  Location: [facility];  Service: Orthopedics;  Laterality: Right;   AMPUTATION Left 05/09/2017   Procedure: AMPUTATION 5TH TOE LEFT FOOT;  Surgeon: [name], DPM;  Location: [facility];  Service: Podiatry;  Laterality: Left;   AMPUTATION Left 06/13/2017   Procedure: AMPUTATION 2ND TOE LEFT FOOT;  Surgeon: [name], DPM;  Location: [facility];  Service: Podiatry;   Laterality: Left;   AMPUTATION Right 06/01/2021   Procedure: Right great toe and Second Toe Amputation;  Surgeon: [name], MD;  Location: [facility];  Service: Vascular;  Laterality: Right;   BUNIONECTOMY Right 07/10/2013   Procedure: Prudy Feeler BUNIONECTOMY RIGHT FOOT;  Surgeon: Marcheta Grammes, DPM;  Location: [facility];  Service: Orthopedics;  Laterality: Right;   CHOLECYSTECTOMY N/A 08/22/2017   Procedure: LAPAROSCOPIC CHOLECYSTECTOMY;  Surgeon: Virl Cagey, MD;  Location: [facility];  Service: General;  Laterality: N/A;   COLONOSCOPY N/A 07/07/2016   Dr. Oneida Alar; redundant left colon, diverticulosis at hepatic flexure, non-bleeding internal hemorrhoids   ESOPHAGOGASTRODUODENOSCOPY N/A 07/07/2016   Dr. Oneida Alar: many non-bleeding cratered gastric ulcers without stigmata of bleeding in gastric antrum. four 2-3 mm angioectasias without bleeding in duodenal bulb and second portion of duodenum s/p APC. Chroni gastritis on path.    ESOPHAGOGASTRODUODENOSCOPY N/A 08/03/2017   Dr. Oneida Alar: erosive gastritis, AVMs. Found a single non-bleeding angioectasia in stomach, s/p APC therapy. Four non-bleeding angioectasias in duodenum s/p APC. Non-bleeding erosive gastropathy   IR ANGIOGRAM EXTREMITY LEFT  04/24/2017   IR FEM POP ART ATHERECT INC PTA MOD SED  04/24/2017   IR INFUSION THROMBOL ARTERIAL INITIAL (MS)  04/24/2017   IR RADIOLOGIST EVAL & MGMT  12/05/2016   IR US GUIDE VASC ACCESS RIGHT  04/24/2017   LIVER BIOPSY N/A 08/22/2017   Procedure: LIVER BIOPSY;  Surgeon: Virl Cagey, MD;  Location: [facility];  Service: General;  Laterality: N/A;   METATARSAL HEAD EXCISION Right 07/10/2013   Procedure: METATARSAL HEAD RESECTION OF DIGITS 2 AND 3 RIGHT FOOT;  Surgeon: Marcheta Grammes, DPM;  Location: [facility];  Service: Orthopedics;  Laterality: Right;   PERIPHERAL VASCULAR INTERVENTION Right 05/19/2021   Procedure: PERIPHERAL VASCULAR INTERVENTION;  Surgeon: [name], MD;  Location: [facility];  Service: Cardiovascular;  Laterality: Right;   PROXIMAL INTERPHALANGEAL FUSION (PIP) Right 07/10/2013   Procedure: ARTHRODESIS PIPJ  2ND DIGIT RIGHT FOOT;  Surgeon: Marcheta Grammes, DPM;  Location: [facility];  Service: Orthopedics;  Laterality: Right;     SOCIAL HISTORY:  Social History   Socioeconomic History   Marital status: Married    Spouse name: [name]   Number of children: 1   Years of education: 12   Highest education level: Not on file  Occupational History   Occupation: disabled    Comment: heart  Tobacco Use   Smoking status: Light Smoker    Packs/day: 0.50    Years: 44.00    Pack years: 22.00    Types: Cigarettes    Start date: 05/10/1975   Smokeless tobacco: Never   Tobacco comments:    2 cigarettes per day 12/10/2019  Vaping Use   Vaping Use: Former  Substance and Sexual Activity   Alcohol use: Not Currently    Alcohol/week: 2.0 standard drinks    Types: 2 Glasses of wine per week   Drug use: No   Sexual activity: Yes    Birth control/protection: Surgical  Other Topics Concern   Not on file  Social History Narrative   Disabled  Lives with husband [name]   Two dogs   Social Determinants of Health   Financial Resource Strain: Not on file  Food Insecurity: Not on file  Transportation Needs: Not on file  Physical Activity: Not on file  Stress: Not on file  Social Connections: Not on file  Intimate Partner Violence: Not on file    FAMILY HISTORY:  Family History  Adopted: Yes  Problem Relation Age of Onset   Hypertension Father    Coronary artery disease Sister    Ulcers Maternal Grandmother    Colon cancer Neg Hx        unknown, was adopted    CURRENT MEDICATIONS:  Outpatient Encounter Medications as of 11/22/2021  Medication Sig   acetaminophen (TYLENOL) 500 MG tablet Take 500 mg by mouth at bedtime as needed for moderate pain.   albuterol (PROVENTIL) (2.5 MG/3ML) 0.083% nebulizer solution Take 3 mLs (2.5 mg total) by nebulization  every 4 (four) hours as needed for wheezing or shortness of breath.   albuterol (VENTOLIN HFA) 108 (90 Base) MCG/ACT inhaler INHALE TWO PUFFS INTO THE LUNGS EVERY SIX HOURS AS NEEDED FOR WHEEZING OR SHORTNESS OF BREATH   ALPRAZolam (XANAX) 0.5 MG tablet TAKE ONE TABLET BY MOUTH EVERY NIGHT AT BEDTIME AS NEEDED FOR ANXIETY   aspirin 81 MG chewable tablet Chew 81 mg by mouth every morning.    atorvastatin (LIPITOR) 40 MG tablet Take 1 tablet (40 mg total) by mouth daily.   bisoprolol (ZEBETA) 10 MG tablet TAKE ONE (1) TABLET BY MOUTH EVERY DAY   blood glucose meter kit and supplies Dispense based on patient and insurance preference. Use up to four times daily as directed. (FOR ICD-10 E10.9, E11.9).   Budeson-Glycopyrrol-Formoterol (BREZTRI AEROSPHERE) 160-9-4.8 MCG/ACT AERO Inhale 2 puffs into the lungs 2 (two) times daily.   Cholecalciferol (VITAMIN D) 50 MCG (2000 UT) CAPS Take 1 capsule (2,000 Units total) by mouth daily.   clopidogrel (PLAVIX) 75 MG tablet Take 1 tablet (75 mg total) by mouth daily with breakfast.   Continuous Blood Gluc Receiver (FREESTYLE LIBRE 14 DAY READER) DEVI See admin instructions.   Continuous Blood Gluc Receiver (FREESTYLE LIBRE 2 READER) DEVI Check blood glucose up to 4 times daily   Continuous Blood Gluc Sensor (FREESTYLE LIBRE 2 SENSOR) MISC See admin instructions.   Continuous Blood Gluc Sensor (FREESTYLE LIBRE 2 SENSOR) MISC Apply one sensor to the upper arm avery 14 days.   cyanocobalamin (,VITAMIN B-12,) 1000 MCG/ML injection INJECT 1 ML INTO THE MUSCLE EVERY 30 DAYS. (Patient taking differently: Inject 1,000 mcg into the muscle every 30 (thirty) days.)   cyclobenzaprine (FLEXERIL) 5 MG tablet Take 1 tablet (5 mg total) by mouth 3 (three) times daily as needed for muscle spasms.   diclofenac (VOLTAREN) 75 MG EC tablet Take 1 tablet (75 mg total) by mouth 2 (two) times daily.   dicyclomine (BENTYL) 10 MG capsule Take 10 mg by mouth as needed for spasms.    empagliflozin (JARDIANCE) 10 MG TABS tablet Take 10 mg by mouth daily before breakfast.   FLUoxetine (PROZAC) 40 MG capsule Take 1 capsule (40 mg total) by mouth daily.   gabapentin (NEURONTIN) 300 MG capsule TAKE ONE CAPSULE (300 MG TOTAL) BY MOUTHTWO TIMES DAILY.   glucose blood (ONETOUCH ULTRA) test strip Use as instructed   glucose monitoring kit (FREESTYLE) monitoring kit Use to check blood sugar BID as directed   lactase (LACTAID) 3000 units tablet Take by mouth as needed.   Lancets [facility]  DELICA PLUS [id number]) MISC 1 each by Does not apply route 2 (two) times a day.   losartan (COZAAR) 100 MG tablet TAKE ONE (1) TABLET BY MOUTH EVERY DAY   Misc. Devices MISC Please provide supplies (needle, syringe, alcohol swabs) needed for patient to self-administer B-12 injections monthly.   pantoprazole (PROTONIX) 40 MG tablet TAKE ONE (1) TABLET 30 MINS BEFORE YOUR FIRST MEAL.   spironolactone (ALDACTONE) 50 MG tablet Take 1 tablet (50 mg total) by mouth daily.   ULTICARE TUBERCULIN SAFETY SYR 25G X 1" 1 ML MISC USE TO ADMINISTER INJECTABLE VITAMIN B12.   No facility-administered encounter medications on file as of 11/22/2021.    ALLERGIES:  Allergies  Allergen Reactions   Bee Venom Swelling   Codeine Anaphylaxis    Tongue swelled   Penicillins Swelling and Rash    Has patient had a PCN reaction causing immediate rash, facial/tongue/throat swelling, SOB or lightheadedness with hypotension: No, delayed Has patient had a PCN reaction causing severe rash involving mucus membranes or skin necrosis: No Has patient had a PCN reaction that required hospitalization No Has patient had a PCN reaction occurring within the last 10 years: No If all of the above answers are "NO", then may proceed with Cephalosporin use.    Bupropion Other (See Comments)    "Made my skin crawl"   Sudafed [Pseudoephedrine Hcl] Rash     PHYSICAL EXAM:  ECOG PERFORMANCE STATUS: 1 - Symptomatic but completely  ambulatory  There were no vitals filed for this visit. There were no vitals filed for this visit. Physical Exam Constitutional:      Appearance: Normal appearance.  HENT:     Head: Normocephalic and atraumatic.     Mouth/Throat:     Mouth: Mucous membranes are moist.  Eyes:     Extraocular Movements: Extraocular movements intact.     Pupils: Pupils are equal, round, and reactive to light.  Cardiovascular:     Rate and Rhythm: Normal rate and regular rhythm.     Pulses: Normal pulses.     Heart sounds: Normal heart sounds.  Pulmonary:     Effort: Pulmonary effort is normal.     Breath sounds: Normal breath sounds.  Abdominal:     General: Bowel sounds are normal.     Palpations: Abdomen is soft.     Tenderness: There is no abdominal tenderness.  Musculoskeletal:        General: No swelling.     Right lower leg: No edema.     Left lower leg: No edema.  Lymphadenopathy:     Cervical: No cervical adenopathy.  Skin:    General: Skin is warm and dry.  Neurological:     General: No focal deficit present.     Mental Status: She is alert and oriented to person, place, and time.  Psychiatric:        Mood and Affect: Mood normal.        Behavior: Behavior normal.     LABORATORY DATA:  I have reviewed the labs as listed.  CBC    Component Value Date/Time   WBC 11.8 (H) 11/15/2021 1328   RBC 4.55 11/15/2021 1328   HGB 14.3 11/15/2021 1328   HGB 15.5 09/01/2020 1510   HCT 44.5 11/15/2021 1328   HCT 45.1 09/01/2020 1510   PLT 420 (H) 11/15/2021 1328   PLT 370 09/01/2020 1510   MCV 97.8 11/15/2021 1328   MCV 94 09/01/2020 1510   MCH 31.4 11/15/2021 1328  MCHC 32.1 11/15/2021 1328   RDW 13.7 11/15/2021 1328   RDW 12.8 09/01/2020 1510   LYMPHSABS 1.5 11/15/2021 1328   LYMPHSABS 1.8 07/02/2019 1539   MONOABS 1.3 (H) 11/15/2021 1328   EOSABS 0.2 11/15/2021 1328   EOSABS 0.1 07/02/2019 1539   BASOSABS 0.1 11/15/2021 1328   BASOSABS 0.1 07/02/2019 1539   CMP Latest Ref  Rng & Units 08/12/2021 07/05/2021 06/01/2021  Glucose 65 - 99 mg/dL 203(H) 186(H) 154(H)  BUN 7 - 25 mg/dL 7 6(L) 4(L)  Creatinine 0.50 - 1.05 mg/dL 0.64 0.72 0.60  Sodium 135 - 146 mmol/L 141 136 139  Potassium 3.5 - 5.3 mmol/L 3.3(L) 3.0(L) 3.1(L)  Chloride 98 - 110 mmol/L 102 102 101  CO2 20 - 32 mmol/L 28 25 -  Calcium 8.6 - 10.4 mg/dL 9.4 8.6(L) -  Total Protein 6.1 - 8.1 g/dL 6.2 6.7 -  Total Bilirubin 0.2 - 1.2 mg/dL 0.3 0.4 -  Alkaline Phos 38 - 126 U/L - 94 -  AST 10 - 35 U/L 8(L) 16 -  ALT 6 - 29 U/L 5(L) 12 -    DIAGNOSTIC IMAGING:  I have independently reviewed the relevant imaging and discussed with the patient.  ASSESSMENT & PLAN: 1.  Iron deficiency anemia: -Thought to be from chronic GI blood loss from erosive gastritis and gastric/small bowel AVMs.  She has history of multiple blood transfusions. - She is on both aspirin and Plavix due to her peripheral vascular and cardiovascular disease. - She was previously on an oral iron supplement, but this was ineffective at improving her iron levels - Last Feraheme 07/19/2021 and 07/26/2021. - Occasional scant rectal bleeding with bowel movements, but no frank hematochezia or melena - Chronic fatigue is stable at baseline - Most recent labs (11/15/2021): Hgb 14.3, ferritin 78, saturation 22% - PLAN: Due to marginal ferritin, recommend the patient restart ferrous sulfate 325 mg once daily.  Recommend that she takes this separate from her PPI, as this could have been causing some of her past malabsorption issues.  She should also take it with a glass of orange juice in the mornings, to improve absorption - Repeat labs and RTC in 4 months   2.  Leukocytosis and thrombocytosis: - Prior myeloproliferative work-up for JAK2 [id number] testing and BCR/ABL by FISH was negative. - Platelets are intermittently elevated.  This is reactive from iron deficiency. - Mild leukocytosis likely reactive related to steroid inhalers. - Most recent labs  (11/15/2021): WBC 11.8/ANC 8.7, platelets 420 - No B symptoms  - PLAN: No further work-up or intervention needed at this time. - If any worsening on repeat CBCs, would consider bone marrow biopsy   3.  B12 deficiency: - She is on monthly B12 injections. - PLAN: Continue monthly B12 injections.  Recheck B12/methylmalonic acid at follow-up visit in 4 months.     4.  Vitamin D deficiency - She is taking vitamin D 2000 units daily. - PLAN: Continue daily vitamin D supplement.  Recheck vitamin D at follow-up visit in 4 months.       PLAN SUMMARY & DISPOSITION: Start taking ferrous sulfate daily Labs in 4 months Phone visit after labs  All questions were answered. The patient knows to call the clinic with any problems, questions or concerns.  Medical decision making: Moderate  Time spent on visit: I spent 20 minutes counseling the patient face to face. The total time spent in the appointment was 30 minutes and more than 50% was on counseling.  Harriett Rush, PA-C  11/22/2021 2:26 PM

## 2021-11-21 NOTE — Telephone Encounter (Signed)
Medicationn request has been sent to patients PCP.

## 2021-11-21 NOTE — Telephone Encounter (Signed)
Patient is requesting a refill of the following medications: Requested Prescriptions   Pending Prescriptions Disp Refills   ALPRAZolam (XANAX) 0.5 MG tablet 30 tablet 0    Sig: TAKE 1 TABLET BY MOUTH AT BEDTIME AS NEEDED FOR ANXIETY    Date of patient request: 11/19/2021 Last office visit: 08/12/2021 Date of last refill: 10/05/2021 Last refill amount: 30 tablets  Follow up time period per chart: 02/09/2022

## 2021-11-22 ENCOUNTER — Inpatient Hospital Stay (HOSPITAL_COMMUNITY): Payer: PPO | Admitting: Physician Assistant

## 2021-11-22 ENCOUNTER — Other Ambulatory Visit: Payer: Self-pay

## 2021-11-22 VITALS — BP 158/75 | HR 77 | Temp 98.6°F | Resp 18 | Ht 65.0 in | Wt 126.5 lb

## 2021-11-22 DIAGNOSIS — D75839 Thrombocytosis, unspecified: Secondary | ICD-10-CM | POA: Diagnosis not present

## 2021-11-22 DIAGNOSIS — D509 Iron deficiency anemia, unspecified: Secondary | ICD-10-CM | POA: Diagnosis not present

## 2021-11-22 DIAGNOSIS — E538 Deficiency of other specified B group vitamins: Secondary | ICD-10-CM | POA: Diagnosis not present

## 2021-11-22 DIAGNOSIS — E559 Vitamin D deficiency, unspecified: Secondary | ICD-10-CM | POA: Diagnosis not present

## 2021-11-22 DIAGNOSIS — D500 Iron deficiency anemia secondary to blood loss (chronic): Secondary | ICD-10-CM | POA: Diagnosis not present

## 2021-11-22 NOTE — Patient Instructions (Signed)
[facility] [place] at [facility] Discharge Instructions  You were seen today by Tarri Abernethy PA-C for your iron deficiency anemia.   Your blood levels looked good, but your iron was very slightly low.  I recommend that you start taking IRON PILL daily (FERROUS SULFATE 325 MG - available over the counter).  Take this in the morning with a glass of orange juice.  Do not take it at the same time as your acid reflux pill.  LABS: Return in 4 months   FOLLOW-UP APPOINTMENT: Phone visit after labs   Thank you for choosing [place] at [facility] to provide your oncology and hematology care.  To afford each patient quality time with our provider, please arrive at least 15 minutes before your scheduled appointment time.   If you have a lab appointment with the [facility] please come in thru the Main Entrance and check in at the main information desk.  You need to re-schedule your appointment should you arrive 10 or more minutes late.  We strive to give you quality time with our providers, and arriving late affects you and other patients whose appointments are after yours.  Also, if you no show three or more times for appointments you may be dismissed from the clinic at the providers discretion.     Again, thank you for choosing [facility].  Our hope is that these requests will decrease the amount of time that you wait before being seen by our physicians.       _____________________________________________________________  Should you have questions after your visit to [facility], please contact our office at [phone] and follow the prompts.  Our office hours are 8:00 a.m. and 4:30 p.m. Monday - Friday.  Please note that voicemails left after 4:00 p.m. may not be returned until the following business day.  We are closed weekends and major holidays.  You do have access to a nurse 24-7, just call the main number to the clinic  [phone] and do not press any options, hold on the line and a nurse will answer the phone.    For prescription refill requests, have your pharmacy contact our office and allow 72 hours.    Due to Covid, you will need to wear a mask upon entering the hospital. If you do not have a mask, a mask will be given to you at the Main Entrance upon arrival. For doctor visits, patients may have 1 support person age 34 or older with them. For treatment visits, patients can not have anyone with them due to social distancing guidelines and our immunocompromised population.

## 2021-11-29 ENCOUNTER — Encounter: Payer: Self-pay | Admitting: Cardiology

## 2021-11-29 NOTE — Progress Notes (Signed)
Cardiology Office Note  Date: 11/30/2021   ID: Tammy Mcpherson, DOB May 12, 1956, MRN [id number]  PCP:  [name], NP  Cardiologist:  Rozann Lesches, MD Electrophysiologist:  None   Chief Complaint  Patient presents with   Cardiac follow-up    History of Present Illness: Tammy Mcpherson is a 66 y.o. adult last seen in December 2021.  She is here overdue for follow-up.  Fortunately, from a cardiac perspective she does not report any obvious angina symptoms, stable NYHA class II dyspnea.  She reports compliance with her medications as noted below.  She does have significant PAD, prior right popliteal angioplasty and stent placement as well as right AT angioplasty by Dr. Stanford Breed.  She has had toe amputations on both feet.  Unfortunately, continues to smoke and has had significant difficulty quitting.  She is on aspirin and Plavix.  She continues to follow regularly with PCP, anticipates lab work in April.  I reviewed her cardiac regimen which is stable and outlined below.  Echocardiogram from last year showed normal LVEF in the range of 60 to 65%.  Past Medical History:  Diagnosis Date   Anemia 04/30/2016   Anxiety    Arthritis    Cardiomyopathy ([place])    a. EF 40-45% by echo in 04/2016 b. Improved to 60-65% by repeat imaging in 2018   COPD (chronic obstructive pulmonary disease) (HCC)    Coronary artery calcification seen on CT scan    Diabetes mellitus without complication (HCC)    Essential hypertension    GERD (gastroesophageal reflux disease)    Headache    History of bronchitis    History of GI bleed    History of hiatal hernia    Hyperlipidemia    Iron deficiency anemia    Peripheral vascular disease (HCC)    Pollen allergy    PSVT (paroxysmal supraventricular tachycardia) (HCC)    PVC's (premature ventricular contractions)    Vitamin B12 deficiency     Past Surgical History:  Procedure Laterality Date   ABDOMINAL AORTOGRAM W/LOWER EXTREMITY Bilateral  05/19/2021   Procedure: ABDOMINAL AORTOGRAM W/LOWER EXTREMITY;  Surgeon: [name], MD;  Location: [facility];  Service: Cardiovascular;  Laterality: Bilateral;   ABDOMINAL HYSTERECTOMY     polyps  about age 51   AIKEN OSTEOTOMY Right 07/10/2013   Procedure: Barbie Banner OSTEOTOMY RIGHT FOOT;  Surgeon: Marcheta Grammes, DPM;  Location: [facility];  Service: Orthopedics;  Laterality: Right;   AMPUTATION Left 05/09/2017   Procedure: AMPUTATION 5TH TOE LEFT FOOT;  Surgeon: [name], DPM;  Location: [facility];  Service: Podiatry;  Laterality: Left;   AMPUTATION Left 06/13/2017   Procedure: AMPUTATION 2ND TOE LEFT FOOT;  Surgeon: [name], DPM;  Location: [facility];  Service: Podiatry;  Laterality: Left;   AMPUTATION Right 06/01/2021   Procedure: Right great toe and Second Toe Amputation;  Surgeon: [name], MD;  Location: [facility];  Service: Vascular;  Laterality: Right;   BUNIONECTOMY Right 07/10/2013   Procedure: Prudy Feeler BUNIONECTOMY RIGHT FOOT;  Surgeon: Marcheta Grammes, DPM;  Location: [facility];  Service: Orthopedics;  Laterality: Right;   CHOLECYSTECTOMY N/A 08/22/2017   Procedure: LAPAROSCOPIC CHOLECYSTECTOMY;  Surgeon: Virl Cagey, MD;  Location: [facility];  Service: General;  Laterality: N/A;   COLONOSCOPY N/A 07/07/2016   Dr. Oneida Alar; redundant left colon, diverticulosis at hepatic flexure, non-bleeding internal hemorrhoids   ESOPHAGOGASTRODUODENOSCOPY N/A 07/07/2016   Dr. Oneida Alar: many non-bleeding cratered gastric ulcers without stigmata  of bleeding in gastric antrum. four 2-3 mm angioectasias without bleeding in duodenal bulb and second portion of duodenum s/p APC. Chroni gastritis on path.    ESOPHAGOGASTRODUODENOSCOPY N/A 08/03/2017   Dr. Oneida Alar: erosive gastritis, AVMs. Found a single non-bleeding angioectasia in stomach, s/p APC therapy. Four non-bleeding angioectasias in duodenum s/p APC. Non-bleeding erosive gastropathy   IR ANGIOGRAM EXTREMITY LEFT   04/24/2017   IR FEM POP ART ATHERECT INC PTA MOD SED  04/24/2017   IR INFUSION THROMBOL ARTERIAL INITIAL (MS)  04/24/2017   IR RADIOLOGIST EVAL & MGMT  12/05/2016   IR US GUIDE VASC ACCESS RIGHT  04/24/2017   LIVER BIOPSY N/A 08/22/2017   Procedure: LIVER BIOPSY;  Surgeon: Virl Cagey, MD;  Location: [facility];  Service: General;  Laterality: N/A;   METATARSAL HEAD EXCISION Right 07/10/2013   Procedure: METATARSAL HEAD RESECTION OF DIGITS 2 AND 3 RIGHT FOOT;  Surgeon: Marcheta Grammes, DPM;  Location: [facility];  Service: Orthopedics;  Laterality: Right;   PERIPHERAL VASCULAR INTERVENTION Right 05/19/2021   Procedure: PERIPHERAL VASCULAR INTERVENTION;  Surgeon: [name], MD;  Location: [facility];  Service: Cardiovascular;  Laterality: Right;   PROXIMAL INTERPHALANGEAL FUSION (PIP) Right 07/10/2013   Procedure: ARTHRODESIS PIPJ  2ND DIGIT RIGHT FOOT;  Surgeon: Marcheta Grammes, DPM;  Location: [facility];  Service: Orthopedics;  Laterality: Right;    Current Outpatient Medications  Medication Sig Dispense Refill   acetaminophen (TYLENOL) 500 MG tablet Take 500 mg by mouth at bedtime as needed for moderate pain.     albuterol (PROVENTIL) (2.5 MG/3ML) 0.083% nebulizer solution Take 3 mLs (2.5 mg total) by nebulization every 4 (four) hours as needed for wheezing or shortness of breath. 75 mL 5   albuterol (VENTOLIN HFA) 108 (90 Base) MCG/ACT inhaler INHALE TWO PUFFS INTO THE LUNGS EVERY SIX HOURS AS NEEDED FOR WHEEZING OR SHORTNESS OF BREATH 8.5 g 5   ALPRAZolam (XANAX) 0.5 MG tablet TAKE ONE TABLET BY MOUTH EVERY NIGHT AT BEDTIME AS NEEDED FOR ANXIETY 30 tablet 0   aspirin 81 MG chewable tablet Chew 81 mg by mouth every morning.      atorvastatin (LIPITOR) 40 MG tablet Take 1 tablet (40 mg total) by mouth daily. 90 tablet 1   bisoprolol (ZEBETA) 10 MG tablet TAKE ONE (1) TABLET BY MOUTH EVERY DAY 90 tablet 1   blood glucose meter kit and supplies Dispense based on patient and  insurance preference. Use up to four times daily as directed. (FOR ICD-10 E10.9, E11.9). 1 each 0   Budeson-Glycopyrrol-Formoterol (BREZTRI AEROSPHERE) 160-9-4.8 MCG/ACT AERO Inhale 2 puffs into the lungs 2 (two) times daily. 11.8 g 12   Cholecalciferol (VITAMIN D) 50 MCG (2000 UT) CAPS Take 1 capsule (2,000 Units total) by mouth daily. 90 capsule 4   clopidogrel (PLAVIX) 75 MG tablet Take 1 tablet (75 mg total) by mouth daily with breakfast. 30 tablet 6   Continuous Blood Gluc Receiver (FREESTYLE LIBRE 14 DAY READER) DEVI See admin instructions. 4 each 0   Continuous Blood Gluc Receiver (FREESTYLE LIBRE 2 READER) DEVI Check blood glucose up to 4 times daily 1 each 0   Continuous Blood Gluc Sensor (FREESTYLE LIBRE 2 SENSOR) MISC See admin instructions.     Continuous Blood Gluc Sensor (FREESTYLE LIBRE 2 SENSOR) MISC Apply one sensor to the upper arm avery 14 days. 6 each 1   cyanocobalamin (,VITAMIN B-12,) 1000 MCG/ML injection INJECT 1 ML INTO THE MUSCLE EVERY 30 DAYS. (  Patient taking differently: Inject 1,000 mcg into the muscle every 30 (thirty) days.) 1 mL 11   cyclobenzaprine (FLEXERIL) 5 MG tablet Take 1 tablet (5 mg total) by mouth 3 (three) times daily as needed for muscle spasms. 15 tablet 3   diclofenac (VOLTAREN) 75 MG EC tablet Take 1 tablet (75 mg total) by mouth 2 (two) times daily. 30 tablet 0   dicyclomine (BENTYL) 10 MG capsule Take 10 mg by mouth as needed for spasms.     empagliflozin (JARDIANCE) 10 MG TABS tablet Take 10 mg by mouth daily before breakfast. 30 tablet 4   FLUoxetine (PROZAC) 40 MG capsule Take 1 capsule (40 mg total) by mouth daily. 90 capsule 3   gabapentin (NEURONTIN) 300 MG capsule TAKE ONE CAPSULE (300 MG TOTAL) BY MOUTHTWO TIMES DAILY. 180 capsule 1   glucose blood (ONETOUCH ULTRA) test strip Use as instructed 100 each 12   glucose monitoring kit (FREESTYLE) monitoring kit Use to check blood sugar BID as directed 1 each 0   lactase (LACTAID) 3000 units tablet  Take by mouth as needed.     Lancets (ONETOUCH DELICA PLUS [id number]) MISC 1 each by Does not apply route 2 (two) times a day. 100 each 3   losartan (COZAAR) 100 MG tablet TAKE ONE (1) TABLET BY MOUTH EVERY DAY 90 tablet 1   Misc. Devices MISC Please provide supplies (needle, syringe, alcohol swabs) needed for patient to self-administer B-12 injections monthly. 1 each 12   pantoprazole (PROTONIX) 40 MG tablet TAKE ONE (1) TABLET 30 MINS BEFORE YOUR FIRST MEAL. 90 tablet 3   spironolactone (ALDACTONE) 50 MG tablet Take 1 tablet (50 mg total) by mouth daily. 90 tablet 1   ULTICARE TUBERCULIN SAFETY SYR 25G X 1" 1 ML MISC USE TO ADMINISTER INJECTABLE VITAMIN B12. 1 each 0   No current facility-administered medications for this visit.   Allergies:  Bee venom, Codeine, Penicillins, Bupropion, and Sudafed [pseudoephedrine hcl]   ROS: No palpitations or syncope.  Physical Exam: VS:  BP 138/84    Pulse 82    Ht '5\' 5"'  (1.651 m)    Wt 129 lb 6.4 oz (58.7 kg)    SpO2 94%    BMI 21.53 kg/m , BMI Body mass index is 21.53 kg/m.  Wt Readings from Last 3 Encounters:  11/30/21 129 lb 6.4 oz (58.7 kg)  11/22/21 126 lb 8.7 oz (57.4 kg)  09/13/21 125 lb 9.6 oz (57 kg)    General: Patient appears comfortable at rest. HEENT: Conjunctiva and lids normal, wearing a mask. Neck: Supple, no elevated JVP or carotid bruits, no thyromegaly. Lungs: Clear to auscultation, nonlabored breathing at rest. Cardiac: Regular rate and rhythm, no S3 or significant systolic murmur. Extremities: No pitting edema.  ECG:  An ECG dated 06/01/2021 was personally reviewed today and demonstrated:  Sinus rhythm.  Recent Labwork: 08/12/2021: ALT 5; AST 8; BUN 7; Creat 0.64; Potassium 3.3; Sodium 141; TSH 1.86 11/15/2021: Hemoglobin 14.3; Platelets 420     Component Value Date/Time   CHOL 165 08/12/2021 1446   CHOL 202 (H) 09/01/2020 1510   TRIG 349 (H) 08/12/2021 1446   HDL 36 (L) 08/12/2021 1446   HDL 31 (L) 09/01/2020 1510    CHOLHDL 4.6 08/12/2021 1446   VLDL 75.0 (H) 04/26/2021 1108   LDLCALC 87 08/12/2021 1446   LDLDIRECT 111.0 04/26/2021 1108    Other Studies Reviewed Today:  Echocardiogram 10/06/2020:  1. Left ventricular ejection fraction, by estimation, is 60 to  65%. The  left ventricle has normal function. The left ventricle has no regional  wall motion abnormalities. Left ventricular diastolic parameters are  indeterminate.   2. Right ventricular systolic function is normal. The right ventricular  size is normal.   3. Left atrial size was moderately dilated.   4. The mitral valve is normal in structure. No evidence of mitral valve  regurgitation. No evidence of mitral stenosis.   5. The aortic valve is tricuspid. There is mild calcification of the  aortic valve. There is mild thickening of the aortic valve. Aortic valve  regurgitation is not visualized. No aortic stenosis is present.   6. The inferior vena cava is normal in size with greater than 50%  respiratory variability, suggesting right atrial pressure of 3 mmHg.   Assessment and Plan:  1.  HFrecEF, history of nonischemic cardiomyopathy with normalization of LVEF by echocardiogram in January 2022, 60 to 65% range.  She is clinically stable with NYHA class II dyspnea.  Continue medical therapy including bisoprolol, losartan, Aldactone, and Jardiance.  She will have follow-up lab work with PCP in April.  2.  PAD as outlined above status post revascularization and toe amputations.  She follows with Dr. Stanford Breed.  Continue aspirin and Plavix.  3.  History of PSVT, no obvious recurrences.  Continue observation on bisoprolol.  Medication Adjustments/Labs and Tests Ordered: Current medicines are reviewed at length with the patient today.  Concerns regarding medicines are outlined above.   Tests Ordered: No orders of the defined types were placed in this encounter.   Medication Changes: No orders of the defined types were placed in this  encounter.   Disposition:  Follow up  6 months.  Signed, Satira Sark, MD, [facility] 11/30/2021 10:13 AM    [facility] [address], [place], [place] [address] Phone: [phone]; Fax: [phone]

## 2021-11-30 ENCOUNTER — Encounter: Payer: Self-pay | Admitting: Cardiology

## 2021-11-30 ENCOUNTER — Ambulatory Visit: Payer: PPO | Admitting: Cardiology

## 2021-11-30 VITALS — BP 138/84 | HR 82 | Ht 65.0 in | Wt 129.4 lb

## 2021-11-30 DIAGNOSIS — I471 Supraventricular tachycardia: Secondary | ICD-10-CM

## 2021-11-30 DIAGNOSIS — I739 Peripheral vascular disease, unspecified: Secondary | ICD-10-CM | POA: Diagnosis not present

## 2021-11-30 DIAGNOSIS — Z8679 Personal history of other diseases of the circulatory system: Secondary | ICD-10-CM | POA: Diagnosis not present

## 2021-11-30 NOTE — Patient Instructions (Signed)

## 2021-12-07 ENCOUNTER — Ambulatory Visit: Payer: PPO | Admitting: "Endocrinology

## 2022-01-03 ENCOUNTER — Other Ambulatory Visit: Payer: Self-pay | Admitting: Registered Nurse

## 2022-01-03 ENCOUNTER — Encounter: Payer: Self-pay | Admitting: Registered Nurse

## 2022-01-03 DIAGNOSIS — F411 Generalized anxiety disorder: Secondary | ICD-10-CM

## 2022-01-03 NOTE — Telephone Encounter (Signed)
I have sent a medication request to patient PCP for medications  ?

## 2022-01-03 NOTE — Telephone Encounter (Signed)
Patient is requesting a refill of the following medications: ?Requested Prescriptions  ? ?Pending Prescriptions Disp Refills  ? ALPRAZolam (XANAX) 0.5 MG tablet [Pharmacy Med Name: ALPRAZOLAM 0.5 MG TAB] 30 tablet   ?  Sig: TAKE ONE TABLET BY MOUTH EVERY NIGHT AT BEDTIME AS NEEDED FOR ANXIETY  ? cyclobenzaprine (FLEXERIL) 5 MG tablet [Pharmacy Med Name: CYCLOBENZAPRINE HCL 5 MG TAB] 15 tablet 3  ?  Sig: TAKE ONE TABLET BY MOUTH UP TO EVERY 8 HOURS AS NEEDED. START WITH ONE TABLET AT BEDTIME AS NEEDED DUE TO SEDATION.  ? ? ?Date of patient request: 01/03/2022 ?Last office visit: 08/12/2021 ?Date of last refill: 2/202/2023 ?Last refill amount: 30 tablets  ?Follow up time period per chart: 02/09/2022  ?

## 2022-01-04 ENCOUNTER — Other Ambulatory Visit: Payer: Self-pay | Admitting: Registered Nurse

## 2022-01-04 DIAGNOSIS — F411 Generalized anxiety disorder: Secondary | ICD-10-CM

## 2022-01-04 DIAGNOSIS — E1169 Type 2 diabetes mellitus with other specified complication: Secondary | ICD-10-CM

## 2022-01-04 MED ORDER — ALPRAZOLAM 0.5 MG PO TABS: ORAL_TABLET | ORAL | 0 refills | Status: DC

## 2022-01-04 MED ORDER — FREESTYLE LIBRE 2 SENSOR MISC: 1 refills | Status: DC

## 2022-01-16 ENCOUNTER — Other Ambulatory Visit: Payer: Self-pay | Admitting: Cardiology

## 2022-02-05 ENCOUNTER — Other Ambulatory Visit (HOSPITAL_COMMUNITY): Payer: Self-pay | Admitting: Hematology

## 2022-02-06 ENCOUNTER — Other Ambulatory Visit (HOSPITAL_COMMUNITY): Payer: Self-pay | Admitting: Hematology

## 2022-02-06 ENCOUNTER — Encounter (HOSPITAL_COMMUNITY): Payer: Self-pay | Admitting: Hematology

## 2022-02-09 ENCOUNTER — Other Ambulatory Visit: Payer: Self-pay | Admitting: Registered Nurse

## 2022-02-09 ENCOUNTER — Other Ambulatory Visit: Payer: Self-pay

## 2022-02-09 ENCOUNTER — Ambulatory Visit (INDEPENDENT_AMBULATORY_CARE_PROVIDER_SITE_OTHER): Payer: PPO | Admitting: Registered Nurse

## 2022-02-09 ENCOUNTER — Ambulatory Visit: Payer: PPO | Admitting: Registered Nurse

## 2022-02-09 ENCOUNTER — Encounter: Payer: Self-pay | Admitting: Registered Nurse

## 2022-02-09 VITALS — BP 162/64 | HR 80 | Temp 98.1°F | Resp 17 | Ht 65.0 in | Wt 131.2 lb

## 2022-02-09 DIAGNOSIS — E1169 Type 2 diabetes mellitus with other specified complication: Secondary | ICD-10-CM

## 2022-02-09 DIAGNOSIS — K31819 Angiodysplasia of stomach and duodenum without bleeding: Secondary | ICD-10-CM

## 2022-02-09 DIAGNOSIS — F411 Generalized anxiety disorder: Secondary | ICD-10-CM | POA: Diagnosis not present

## 2022-02-09 DIAGNOSIS — K279 Peptic ulcer, site unspecified, unspecified as acute or chronic, without hemorrhage or perforation: Secondary | ICD-10-CM

## 2022-02-09 DIAGNOSIS — W19XXXA Unspecified fall, initial encounter: Secondary | ICD-10-CM

## 2022-02-09 DIAGNOSIS — I10 Essential (primary) hypertension: Secondary | ICD-10-CM

## 2022-02-09 DIAGNOSIS — E1140 Type 2 diabetes mellitus with diabetic neuropathy, unspecified: Secondary | ICD-10-CM | POA: Diagnosis not present

## 2022-02-09 DIAGNOSIS — J449 Chronic obstructive pulmonary disease, unspecified: Secondary | ICD-10-CM | POA: Diagnosis not present

## 2022-02-09 MED ORDER — PANTOPRAZOLE SODIUM 40 MG PO TBEC: DELAYED_RELEASE_TABLET | ORAL | 3 refills | Status: DC

## 2022-02-09 MED ORDER — ALPRAZOLAM 0.5 MG PO TABS: 0.5000 mg | ORAL_TABLET | Freq: Every day | ORAL | 0 refills | Status: DC | PRN

## 2022-02-09 MED ORDER — GABAPENTIN 300 MG PO CAPS: ORAL_CAPSULE | ORAL | 1 refills | Status: DC

## 2022-02-09 MED ORDER — ATORVASTATIN CALCIUM 40 MG PO TABS: 40.0000 mg | ORAL_TABLET | Freq: Every day | ORAL | 1 refills | Status: DC

## 2022-02-09 MED ORDER — ALBUTEROL SULFATE (2.5 MG/3ML) 0.083% IN NEBU: 2.5000 mg | INHALATION_SOLUTION | RESPIRATORY_TRACT | 5 refills | Status: DC | PRN

## 2022-02-09 MED ORDER — LOSARTAN POTASSIUM 100 MG PO TABS: 100.0000 mg | ORAL_TABLET | Freq: Every day | ORAL | 1 refills | Status: DC

## 2022-02-09 MED ORDER — CLOPIDOGREL BISULFATE 75 MG PO TABS: ORAL_TABLET | ORAL | 1 refills | Status: DC

## 2022-02-09 MED ORDER — ALBUTEROL SULFATE HFA 108 (90 BASE) MCG/ACT IN AERS: INHALATION_SPRAY | RESPIRATORY_TRACT | 5 refills | Status: DC

## 2022-02-09 MED ORDER — FREESTYLE LIBRE 2 SENSOR MISC: 1 refills | Status: DC

## 2022-02-09 MED ORDER — FLUOXETINE HCL 40 MG PO CAPS: 40.0000 mg | ORAL_CAPSULE | Freq: Every day | ORAL | 3 refills | Status: DC

## 2022-02-09 MED ORDER — BISOPROLOL FUMARATE 10 MG PO TABS: ORAL_TABLET | ORAL | 1 refills | Status: DC

## 2022-02-09 MED ORDER — EMPAGLIFLOZIN 10 MG PO TABS: 10.0000 mg | ORAL_TABLET | Freq: Every day | ORAL | 1 refills | Status: DC

## 2022-02-09 NOTE — Progress Notes (Signed)
? ?Established Patient Office Visit ? ?Subjective:  ?[name], adult    DOB: 1956/02/07  Age: 66 y.o. MRN: [id number] ? ?CC:  ?Chief Complaint  ?Patient presents with  ? Follow-up  ?  Patient states she is here for a follow up on labs she has no other concerns  ? ? ?HPI ?Tammy Mcpherson presents for med check ? ?Last A1c:  ?Lab Results  ?Component Value Date  ? HGBA1C 6.6 (H) 08/12/2021  ?  ?Currently taking: empagliflozin 105m po qd ?No new complications ?Reports good compliance with medications ?Diet has been healthy ?Exercise habits have been increasing as she recovers from amputation of multiple toes. ? ?Fall ?Went to get new glasses - new bifocals. ?Was visiting her sister and she couldn't see effectively.  ?Tripped and fell - mechanical fall ?Some bruising and soreness but no changes to neuro or cognitive function ?No changes to ROM ?Healing as expected. ? ?Hypertension: ?Patient Currently taking: bisoprolol 153mpo qd, losartan 1004mo qd,  ?Good effect. No AEs. ?Denies CV symptoms including: chest pain, shob, doe, headache, visual changes, fatigue, claudication, and dependent edema.  ? ?Previous readings and labs: ?BP Readings from Last 3 Encounters:  ?02/09/22 (!) 162/64  ?11/30/21 138/84  ?11/22/21 (!) 158/75  ? ?Lab Results  ?Component Value Date  ? CREATININE 0.64 08/12/2021  ? ? ?Anxiety and depression ?Fluoxetine 55m51m qd ?Good effect, no AE ?Alprazolam for breakthrough anxiety, takes one most days. ?Pdmp consulted, no acute concerns. ? ?Outpatient Medications Prior to Visit  ?Medication Sig Dispense Refill  ? acetaminophen (TYLENOL) 500 MG tablet Take 500 mg by mouth at bedtime as needed for moderate pain.    ? aspirin 81 MG chewable tablet Chew 81 mg by mouth every morning.     ? blood glucose meter kit and supplies Dispense based on patient and insurance preference. Use up to four times daily as directed. (FOR ICD-10 E10.9, E11.9). 1 each 0  ? Budeson-Glycopyrrol-Formoterol  (BREZTRI AEROSPHERE) 160-9-4.8 MCG/ACT AERO Inhale 2 puffs into the lungs 2 (two) times daily. 11.8 g 12  ? Cholecalciferol (VITAMIN D) 50 MCG (2000 UT) CAPS Take 1 capsule (2,000 Units total) by mouth daily. 90 capsule 4  ? Continuous Blood Gluc Receiver (FREESTYLE LIBRE 14 DAY READER) DEVI See admin instructions. 4 each 0  ? Continuous Blood Gluc Receiver (FREESTYLE LIBRE 2 READER) DEVI Check blood glucose up to 4 times daily 1 each 0  ? Continuous Blood Gluc Sensor (FREESTYLE LIBRE 2 SENSOR) MISC See admin instructions.    ? cyanocobalamin (,VITAMIN B-12,) 1000 MCG/ML injection INJECT 1 ML INTO THE MUSCLE EVERY 30 DAYS. (Patient taking differently: Inject 1,000 mcg into the muscle every 30 (thirty) days.) 1 mL 11  ? cyclobenzaprine (FLEXERIL) 5 MG tablet TAKE ONE TABLET BY MOUTH UP TO EVERY 8 HOURS AS NEEDED. START WITH ONE TABLET AT BEDTIME AS NEEDED DUE TO SEDATION. 15 tablet 3  ? diclofenac (VOLTAREN) 75 MG EC tablet Take 1 tablet (75 mg total) by mouth 2 (two) times daily. 30 tablet 0  ? dicyclomine (BENTYL) 10 MG capsule Take 10 mg by mouth as needed for spasms.    ? glucose blood (ONETOUCH ULTRA) test strip Use as instructed 100 each 12  ? glucose monitoring kit (FREESTYLE) monitoring kit Use to check blood sugar BID as directed 1 each 0  ? lactase (LACTAID) 3000 units tablet Take by mouth as needed.    ? Lancets (ONETOUCH DELICA PLUS [id number]  1 each by Does not apply route 2 (two) times a day. 100 each 3  ? Misc. Devices MISC Please provide supplies (needle, syringe, alcohol swabs) needed for patient to self-administer B-12 injections monthly. 1 each 12  ? spironolactone (ALDACTONE) 50 MG tablet Take 1 tablet (50 mg total) by mouth daily. 90 tablet 1  ? ULTICARE TUBERCULIN SAFETY SYR 25G X 1" 1 ML MISC USE TO ADMINISTER INJECTABLE VITAMIN B12. 1 each 0  ? albuterol (PROVENTIL) (2.5 MG/3ML) 0.083% nebulizer solution Take 3 mLs (2.5 mg total) by nebulization every 4 (four) hours as needed for wheezing  or shortness of breath. 75 mL 5  ? albuterol (VENTOLIN HFA) 108 (90 Base) MCG/ACT inhaler INHALE TWO PUFFS INTO THE LUNGS EVERY SIX HOURS AS NEEDED FOR WHEEZING OR SHORTNESS OF BREATH 8.5 g 5  ? ALPRAZolam (XANAX) 0.5 MG tablet TAKE ONE TABLET BY MOUTH EVERY NIGHT AT BEDTIME AS NEEDED FOR ANXIETY 30 tablet 0  ? ALPRAZolam (XANAX) 0.5 MG tablet TAKE ONE TABLET BY MOUTH EVERY NIGHT AT BEDTIME AS NEEDED FOR ANXIETY 30 tablet 0  ? bisoprolol (ZEBETA) 10 MG tablet TAKE ONE (1) TABLET BY MOUTH EVERY DAY 90 tablet 1  ? clopidogrel (PLAVIX) 75 MG tablet TAKE ONE TABLET (75MG TOTAL) BY MOUTH DAILY WITH BREAKFAST 30 tablet 6  ? Continuous Blood Gluc Sensor (FREESTYLE LIBRE 2 SENSOR) MISC Apply one sensor to the upper arm avery 14 days. 6 each 1  ? empagliflozin (JARDIANCE) 10 MG TABS tablet Take 10 mg by mouth daily before breakfast. 30 tablet 4  ? FLUoxetine (PROZAC) 40 MG capsule Take 1 capsule (40 mg total) by mouth daily. 90 capsule 3  ? gabapentin (NEURONTIN) 300 MG capsule TAKE ONE CAPSULE (300 MG TOTAL) BY MOUTHTWO TIMES DAILY. 180 capsule 1  ? losartan (COZAAR) 100 MG tablet TAKE ONE (1) TABLET BY MOUTH EVERY DAY 90 tablet 1  ? pantoprazole (PROTONIX) 40 MG tablet TAKE ONE (1) TABLET 30 MINS BEFORE YOUR FIRST MEAL. 90 tablet 3  ? atorvastatin (LIPITOR) 40 MG tablet Take 1 tablet (40 mg total) by mouth daily. 90 tablet 1  ? ?No facility-administered medications prior to visit.  ? ? ?Review of Systems  ?Constitutional: Negative.   ?HENT: Negative.    ?Eyes: Negative.   ?Respiratory: Negative.    ?Cardiovascular: Negative.   ?Gastrointestinal: Negative.   ?Genitourinary: Negative.   ?Musculoskeletal: Negative.   ?Skin: Negative.   ?Neurological: Negative.   ?Psychiatric/Behavioral: Negative.    ?All other systems reviewed and are negative. ? ?  ?Objective:  ?  ? ?BP (!) 162/64   Pulse 80   Temp 98.1 ?F (36.7 ?C) (Temporal)   Resp 17   Ht _0  (1.651 m)   Wt 131 lb 3.2 oz (59.5 kg)   SpO2 99%   BMI 21.83 kg/m?   ? ?Wt Readings from Last 3 Encounters:  ?02/09/22 131 lb 3.2 oz (59.5 kg)  ?11/30/21 129 lb 6.4 oz (58.7 kg)  ?11/22/21 126 lb 8.7 oz (57.4 kg)  ? ?Physical Exam ?Constitutional:   ?   General: She is not in acute distress. ?   Appearance: Normal appearance. She is normal weight. She is not ill-appearing, toxic-appearing or diaphoretic.  ?Cardiovascular:  ?   Rate and Rhythm: Normal rate and regular rhythm.  ?   Heart sounds: Normal heart sounds. No murmur heard. ?  No friction rub. No gallop.  ?Pulmonary:  ?   Effort: Pulmonary effort is normal. No respiratory distress.  ?  Breath sounds: Normal breath sounds. No stridor. No wheezing, rhonchi or rales.  ?Chest:  ?   Chest wall: No tenderness.  ?Neurological:  ?   General: No focal deficit present.  ?   Mental Status: She is alert and oriented to person, place, and time. Mental status is at baseline.  ?Psychiatric:     ?   Mood and Affect: Mood normal.     ?   Behavior: Behavior normal.     ?   Thought Content: Thought content normal.     ?   Judgment: Judgment normal.  ? ? ?No results found for any visits on 02/09/22. ? ? ? ?The 10-year ASCVD risk score (Arnett DK, et al., 2019) is: 36.5% ? ?  ?Assessment & Plan:  ? ?Problem List Items Addressed This Visit   ? ?  ? Cardiovascular and Mediastinum  ? Essential hypertension  ?  Mildly elevated today. Continue current regimen but advised to improve lifestyle control including diet and exercise. She endorses increasing activity lately, will continue with this.  ? ?  ?  ? Relevant Medications  ? atorvastatin (LIPITOR) 40 MG tablet  ? bisoprolol (ZEBETA) 10 MG tablet  ? clopidogrel (PLAVIX) 75 MG tablet  ? losartan (COZAAR) 100 MG tablet  ? Gastric AVM  ?  Stable at this time. Continue.  ? ?  ?  ? Relevant Medications  ? atorvastatin (LIPITOR) 40 MG tablet  ? bisoprolol (ZEBETA) 10 MG tablet  ? clopidogrel (PLAVIX) 75 MG tablet  ? losartan (COZAAR) 100 MG tablet  ? pantoprazole (PROTONIX) 40 MG tablet  ?  ? Respiratory   ? Chronic obstructive pulmonary disease (HCC) - Primary  ?  Stable on current medications. Continue.  ? ?  ?  ? Relevant Medications  ? albuterol (PROVENTIL) (2.5 MG/3ML) 0.083% nebulizer solution  ? albuterol (VENT

## 2022-02-09 NOTE — Patient Instructions (Addendum)
Tammy Mcpherson - ? ?Great to see you! ? ?Glad things are getting to be very stable. ? ?Call if you need anything, otherwise, see you in 6 months ? ?Thanks, ? ?Rich  ? ? ? ?If you have lab work done today you will be contacted with your lab results within the next 2 weeks.  If you have not heard from [place] then please contact us. The fastest way to get your results is to register for My Chart. ? ? ?IF you received an x-ray today, you will receive an invoice from [facility]. Please contact [facility] at [phone] with questions or concerns regarding your invoice.  ? ?IF you received labwork today, you will receive an invoice from [facility]. Please contact [facility] at [phone] with questions or concerns regarding your invoice.  ? ?Our billing staff will not be able to assist you with questions regarding bills from these companies. ? ?You will be contacted with the lab results as soon as they are available. The fastest way to get your results is to activate your My Chart account. Instructions are located on the last page of this paperwork. If you have not heard from [place] regarding the results in 2 weeks, please contact this office. ?  ? ? ?

## 2022-02-10 ENCOUNTER — Ambulatory Visit (HOSPITAL_COMMUNITY)
Admission: RE | Admit: 2022-02-10 | Discharge: 2022-02-10 | Disposition: A | Discharge status: Home / Self Care | Payer: PPO | Source: Ambulatory Visit | Attending: Acute Care | Admitting: Acute Care

## 2022-02-10 DIAGNOSIS — F17210 Nicotine dependence, cigarettes, uncomplicated: Secondary | ICD-10-CM | POA: Insufficient documentation

## 2022-02-10 DIAGNOSIS — Z87891 Personal history of nicotine dependence: Secondary | ICD-10-CM | POA: Insufficient documentation

## 2022-02-13 NOTE — Assessment & Plan Note (Signed)
Stable on current medications.  Continue. 

## 2022-02-13 NOTE — Assessment & Plan Note (Signed)
Stable at this time. Continue.  ?

## 2022-02-13 NOTE — Assessment & Plan Note (Signed)
Mildly elevated today. Continue current regimen but advised to improve lifestyle control including diet and exercise. She endorses increasing activity lately, will continue with this.  ?

## 2022-02-13 NOTE — Assessment & Plan Note (Signed)
Stable on current regimen. Continue. Follow up q 6 mo, sooner if sugars rise. ?She will continue CGM which has shown great effect in helping with her numbers ?

## 2022-02-13 NOTE — Assessment & Plan Note (Signed)
Stable. Check labs, continue monitoring.  ?

## 2022-02-13 NOTE — Assessment & Plan Note (Signed)
Stable on current meds. Refills given x 6 mo. ?

## 2022-02-14 ENCOUNTER — Other Ambulatory Visit: Payer: Self-pay | Admitting: Acute Care

## 2022-02-14 DIAGNOSIS — Z87891 Personal history of nicotine dependence: Secondary | ICD-10-CM

## 2022-02-14 DIAGNOSIS — F17210 Nicotine dependence, cigarettes, uncomplicated: Secondary | ICD-10-CM

## 2022-02-14 DIAGNOSIS — Z122 Encounter for screening for malignant neoplasm of respiratory organs: Secondary | ICD-10-CM

## 2022-03-08 ENCOUNTER — Other Ambulatory Visit (HOSPITAL_COMMUNITY): Payer: Self-pay | Admitting: Hematology

## 2022-03-15 ENCOUNTER — Encounter (HOSPITAL_COMMUNITY): Payer: Self-pay

## 2022-03-15 ENCOUNTER — Other Ambulatory Visit (HOSPITAL_COMMUNITY): Payer: Self-pay | Admitting: Hematology

## 2022-03-15 MED ORDER — VITAMIN D 50 MCG (2000 UT) PO CAPS: 1.0000 | ORAL_CAPSULE | Freq: Every day | ORAL | 4 refills | Status: DC

## 2022-03-19 ENCOUNTER — Encounter: Payer: Self-pay | Admitting: Registered Nurse

## 2022-03-20 ENCOUNTER — Other Ambulatory Visit: Payer: Self-pay | Admitting: Registered Nurse

## 2022-03-20 DIAGNOSIS — F411 Generalized anxiety disorder: Secondary | ICD-10-CM

## 2022-03-20 NOTE — Telephone Encounter (Signed)
Patient is not currently due for a medication refill at this time.

## 2022-03-20 NOTE — Telephone Encounter (Signed)
Patient is requesting a refill of the following medications: Requested Prescriptions   Pending Prescriptions Disp Refills   ALPRAZolam (XANAX) 0.5 MG tablet [Pharmacy Med Name: ALPRAZOLAM 0.5 MG TAB] 30 tablet     Sig: TAKE ONE TABLET (0.'5MG'$  TOTAL) BY MOUTH DAILY AS NEEDED FOR ANXIETY    Date of patient request: 03/20/2022 Last office visit: 02/09/2022 Date of last refill: 02/09/2022 Last refill amount: 30 tablets  Follow up time period per chart: 08/15/2022

## 2022-03-21 ENCOUNTER — Inpatient Hospital Stay (HOSPITAL_COMMUNITY): Payer: PPO | Attending: Hematology

## 2022-03-21 DIAGNOSIS — Z79899 Other long term (current) drug therapy: Secondary | ICD-10-CM | POA: Diagnosis not present

## 2022-03-21 DIAGNOSIS — E538 Deficiency of other specified B group vitamins: Secondary | ICD-10-CM | POA: Diagnosis not present

## 2022-03-21 DIAGNOSIS — E559 Vitamin D deficiency, unspecified: Secondary | ICD-10-CM | POA: Diagnosis not present

## 2022-03-21 DIAGNOSIS — D509 Iron deficiency anemia, unspecified: Secondary | ICD-10-CM | POA: Diagnosis present

## 2022-03-21 DIAGNOSIS — D75839 Thrombocytosis, unspecified: Secondary | ICD-10-CM | POA: Insufficient documentation

## 2022-03-21 DIAGNOSIS — D500 Iron deficiency anemia secondary to blood loss (chronic): Secondary | ICD-10-CM

## 2022-03-21 LAB — CBC WITH DIFFERENTIAL/PLATELET
Abs Immature Granulocytes: 0.07 10*3/uL (ref 0.00–0.07)
Basophils Absolute: 0 10*3/uL (ref 0.0–0.1)
Basophils Relative: 0 %
Eosinophils Absolute: 0.1 10*3/uL (ref 0.0–0.5)
Eosinophils Relative: 1 %
HCT: 43.9 % (ref 36.0–46.0)
Hemoglobin: 14.1 g/dL (ref 12.0–15.0)
Immature Granulocytes: 1 %
Lymphocytes Relative: 13 %
Lymphs Abs: 1.6 10*3/uL (ref 0.7–4.0)
MCH: 31 pg (ref 26.0–34.0)
MCHC: 32.1 g/dL (ref 30.0–36.0)
MCV: 96.5 fL (ref 80.0–100.0)
Monocytes Absolute: 1.5 10*3/uL — ABNORMAL HIGH (ref 0.1–1.0)
Monocytes Relative: 12 %
Neutro Abs: 9.2 10*3/uL — ABNORMAL HIGH (ref 1.7–7.7)
Neutrophils Relative %: 73 %
Platelets: 402 10*3/uL — ABNORMAL HIGH (ref 150–400)
RBC: 4.55 MIL/uL (ref 3.87–5.11)
RDW: 14 % (ref 11.5–15.5)
WBC: 12.6 10*3/uL — ABNORMAL HIGH (ref 4.0–10.5)
nRBC: 0 % (ref 0.0–0.2)

## 2022-03-21 LAB — IRON AND TIBC
Iron: 41 ug/dL (ref 28–170)
Saturation Ratios: 10 % — ABNORMAL LOW (ref 10.4–31.8)
TIBC: 420 ug/dL (ref 250–450)
UIBC: 379 ug/dL

## 2022-03-21 LAB — FERRITIN: Ferritin: 23 ng/mL (ref 11–307)

## 2022-03-21 LAB — VITAMIN D 25 HYDROXY (VIT D DEFICIENCY, FRACTURES): Vit D, 25-Hydroxy: 47.33 ng/mL (ref 30–100)

## 2022-03-21 LAB — VITAMIN B12: Vitamin B-12: 511 pg/mL (ref 180–914)

## 2022-03-23 LAB — METHYLMALONIC ACID, SERUM: Methylmalonic Acid, Quantitative: 77 nmol/L (ref 0–378)

## 2022-03-27 ENCOUNTER — Encounter: Payer: Self-pay | Admitting: Registered Nurse

## 2022-03-27 ENCOUNTER — Other Ambulatory Visit: Payer: Self-pay

## 2022-03-27 DIAGNOSIS — E1169 Type 2 diabetes mellitus with other specified complication: Secondary | ICD-10-CM

## 2022-03-27 MED ORDER — FREESTYLE LIBRE 2 SENSOR MISC: 1 refills | Status: DC

## 2022-03-27 NOTE — Progress Notes (Signed)
Virtual Visit via Telephone Note [facility]  I connected with Tammy Mcpherson  on 03/28/22 at  3:29 PM by telephone and verified that I am speaking with the correct person using two identifiers.  Location: Patient: Home Provider: [facility]   I discussed the limitations, risks, security and privacy concerns of performing an evaluation and management service by telephone and the availability of in person appointments. I also discussed with the patient that there may be a patient responsible charge related to this service. The patient expressed understanding and agreed to proceed.  REASON FOR VISIT:  Follow-up for iron deficiency anemia   PRIOR THERAPY: None   CURRENT THERAPY: Intermittent Feraheme  INTERVAL HISTORY: Tammy Mcpherson 66 y.o. female returns for routine follow-up of her iron deficiency anemia.  She was last seen by Rojelio Brenner PA-C on 11/22/2021.  Last IV iron was Feraheme x2 on 07/19/2021 and 07/26/2021.   At today's visit, she reports feeling fairly well.  She continues to take both Plavix and aspirin for her peripheral arterial disease.  She notices occasional scant rectal bleeding after a bowel movement, but denies any frank hematochezia, melena, epistaxis, or hematemesis.  She has fatigue that is somewhat worse than usual.  She has weekly headaches, which is more than usual.  She has baseline dyspnea on exertion from underlying COPD.  She denies any pica, lightheadedness, syncope, or chest pain.  She denies any B symptoms such as fever, chills, night sweats, unintentional weight loss.  She continues to take her monthly vitamin B12 injection as well as daily vitamin D supplement. Oral iron supplementation was restarted after her visit in February 2023, but she was unable to tolerate this due to stomach cramps.  She reports that she is taking this daily and is tolerating it well.   She has 70% energy and 70% appetite. She endorses that she is  maintaining a stable weight.    OBSERVATIONS/OBJECTIVE: Review of Systems  Constitutional:  Positive for malaise/fatigue. Negative for chills, diaphoresis, fever and weight loss.  Respiratory:  Positive for cough and shortness of breath (COPD).   Cardiovascular:  Negative for chest pain and palpitations.  Gastrointestinal:  Negative for abdominal pain, blood in stool, melena, nausea and vomiting.  Neurological:  Negative for dizziness and headaches.  Psychiatric/Behavioral:  Positive for suicidal ideas. The patient is nervous/anxious.      PHYSICAL EXAM (per limitations of virtual telephone visit): The patient is alert and oriented x 3, exhibiting adequate mentation, good mood, and ability to speak in full sentences and execute sound judgement.   ASSESSMENT & PLAN: 1.  Iron deficiency anemia: -Thought to be from chronic GI blood loss from erosive gastritis and gastric/small bowel AVMs.  She has history of multiple blood transfusions. - She is on both aspirin and Plavix due to her peripheral vascular and cardiovascular disease. - She was previously on an oral iron supplement, but this was ineffective at improving her iron levels.  She was restarted on oral iron supplement in February 2023, but was unable to tolerate it due to stomach cramps. - Last Feraheme 07/19/2021 and 07/26/2021. - Occasional scant rectal bleeding with bowel movements, but no frank hematochezia or melena   -She has worsening fatigue and headaches - Most recent labs (03/21/2022): Hgb 14.1/MCV 96.5, ferritin 23, iron saturation 10% - PLAN: Recommend IV iron with Feraheme x2. -Discontinue oral iron due to side effects - Repeat labs and RTC in 4 months    2.  Leukocytosis and thrombocytosis: - Prior myeloproliferative work-up for JAK2 V617F testing and BCR/ABL by FISH was negative. - Platelets are intermittently elevated.  This is reactive from iron deficiency. - Mild leukocytosis likely reactive related to steroid  inhalers. - Most recent labs (03/21/2022): WBC 12.6/ANC 9.2, platelets 402 - No B symptoms    - PLAN: No further work-up or intervention needed at this time. - If any worsening on repeat CBCs, would consider bone marrow biopsy    3.  B12 deficiency: - She is on monthly B12 injections, which her husband administers to her at home - Most recent labs (03/21/2022): Normal vitamin B12 at 511, normal methylmalonic acid - PLAN: Continue monthly B12 injections.  Recheck B12/methylmalonic acid in 6 to 12 months.      4.  Vitamin D deficiency - She is taking vitamin D 2000 units daily. - Most recent labs (03/21/2022): Normal vitamin D 47.33 - PLAN: Continue daily vitamin D supplement.  Recheck vitamin D in 6 to 12 months.   FOLLOW UP INSTRUCTIONS: Feraheme x2 CBC + iron panel in 4 months Phone visit after labs    I discussed the assessment and treatment plan with the patient. The patient was provided an opportunity to ask questions and all were answered. The patient agreed with the plan and demonstrated an understanding of the instructions.   The patient was advised to call back or seek an in-person evaluation if the symptoms worsen or if the condition fails to improve as anticipated.  I provided 13 minutes of non-face-to-face time during this encounter.   Carnella Guadalajara, PA-C 03/28/2022 4:44 PM

## 2022-03-28 ENCOUNTER — Inpatient Hospital Stay (HOSPITAL_BASED_OUTPATIENT_CLINIC_OR_DEPARTMENT_OTHER): Payer: PPO | Admitting: Physician Assistant

## 2022-03-28 DIAGNOSIS — D500 Iron deficiency anemia secondary to blood loss (chronic): Secondary | ICD-10-CM | POA: Diagnosis not present

## 2022-04-05 ENCOUNTER — Other Ambulatory Visit (HOSPITAL_COMMUNITY): Payer: Self-pay | Admitting: Hematology

## 2022-04-06 ENCOUNTER — Encounter (HOSPITAL_COMMUNITY): Payer: Self-pay

## 2022-04-06 ENCOUNTER — Other Ambulatory Visit (HOSPITAL_COMMUNITY): Payer: Self-pay | Admitting: Hematology

## 2022-04-06 ENCOUNTER — Encounter (HOSPITAL_COMMUNITY): Payer: Self-pay | Admitting: Hematology

## 2022-04-07 ENCOUNTER — Encounter: Payer: Self-pay | Admitting: Registered Nurse

## 2022-04-13 ENCOUNTER — Inpatient Hospital Stay (HOSPITAL_COMMUNITY): Payer: PPO | Attending: Hematology

## 2022-04-13 VITALS — BP 144/56 | HR 67 | Temp 98.6°F | Resp 18

## 2022-04-13 DIAGNOSIS — D509 Iron deficiency anemia, unspecified: Secondary | ICD-10-CM | POA: Insufficient documentation

## 2022-04-13 DIAGNOSIS — E538 Deficiency of other specified B group vitamins: Secondary | ICD-10-CM | POA: Diagnosis not present

## 2022-04-13 DIAGNOSIS — Z79899 Other long term (current) drug therapy: Secondary | ICD-10-CM | POA: Insufficient documentation

## 2022-04-13 DIAGNOSIS — D508 Other iron deficiency anemias: Secondary | ICD-10-CM

## 2022-04-13 DIAGNOSIS — R0602 Shortness of breath: Secondary | ICD-10-CM | POA: Diagnosis not present

## 2022-04-13 DIAGNOSIS — R5383 Other fatigue: Secondary | ICD-10-CM | POA: Diagnosis not present

## 2022-04-13 DIAGNOSIS — D72829 Elevated white blood cell count, unspecified: Secondary | ICD-10-CM | POA: Insufficient documentation

## 2022-04-13 DIAGNOSIS — E559 Vitamin D deficiency, unspecified: Secondary | ICD-10-CM | POA: Diagnosis not present

## 2022-04-13 DIAGNOSIS — R059 Cough, unspecified: Secondary | ICD-10-CM | POA: Insufficient documentation

## 2022-04-13 DIAGNOSIS — I10 Essential (primary) hypertension: Secondary | ICD-10-CM

## 2022-04-13 MED ORDER — SODIUM CHLORIDE 0.9 % IV SOLN
510.0000 mg | Freq: Once | INTRAVENOUS | Status: AC
  Administered 2022-04-13: 510 mg via INTRAVENOUS
  Filled 2022-04-13: qty 510

## 2022-04-13 MED ORDER — ACETAMINOPHEN 325 MG PO TABS
650.0000 mg | ORAL_TABLET | Freq: Once | ORAL | Status: AC
  Administered 2022-04-13: 650 mg via ORAL
  Filled 2022-04-13: qty 2

## 2022-04-13 MED ORDER — SODIUM CHLORIDE 0.9 % IV SOLN: Freq: Once | INTRAVENOUS | Status: AC

## 2022-04-13 MED ORDER — CLONIDINE HCL 0.1 MG PO TABS
0.2000 mg | ORAL_TABLET | Freq: Once | ORAL | Status: AC
  Administered 2022-04-13: 0.2 mg via ORAL
  Filled 2022-04-13: qty 2

## 2022-04-13 MED ORDER — LORATADINE 10 MG PO TABS
10.0000 mg | ORAL_TABLET | Freq: Once | ORAL | Status: AC
  Administered 2022-04-13: 10 mg via ORAL
  Filled 2022-04-13: qty 1

## 2022-04-13 NOTE — Patient Instructions (Signed)
[place]  Discharge Instructions: Thank you for choosing [place] to provide your oncology and hematology care.  If you have a lab appointment with the [facility], please come in thru the Main Entrance and check in at the main information desk.  Wear comfortable clothing and clothing appropriate for easy access to any Portacath or PICC line.   We strive to give you quality time with your provider. You may need to reschedule your appointment if you arrive late (15 or more minutes).  Arriving late affects you and other patients whose appointments are after yours.  Also, if you miss three or more appointments without notifying the office, you may be dismissed from the clinic at the provider's discretion.      For prescription refill requests, have your pharmacy contact our office and allow 72 hours for refills to be completed.    Today you received the following chemotherapy and/or immunotherapy agents Feraheme.  Ferumoxytol Injection What is this medication? FERUMOXYTOL (FER ue MOX i tol) treats low levels of iron in your body (iron deficiency anemia). Iron is a mineral that plays an important role in making red blood cells, which carry oxygen from your lungs to the rest of your body. This medicine may be used for other purposes; ask your health care provider or pharmacist if you have questions. COMMON BRAND NAME(S): Feraheme What should I tell my care team before I take this medication? They need to know if you have any of these conditions: Anemia not caused by low iron levels High levels of iron in the blood Magnetic resonance imaging (MRI) test scheduled An unusual or allergic reaction to iron, other medications, foods, dyes, or preservatives Pregnant or trying to get pregnant Breast-feeding How should I use this medication? This medication is for injection into a vein. It is given in a hospital or clinic setting. Talk to your care team the use of this  medication in children. Special care may be needed. Overdosage: If you think you have taken too much of this medicine contact a poison control center or emergency room at once. NOTE: This medicine is only for you. Do not share this medicine with others. What if I miss a dose? It is important not to miss your dose. Call your care team if you are unable to keep an appointment. What may interact with this medication? Other iron products This list may not describe all possible interactions. Give your health care provider a list of all the medicines, herbs, non-prescription drugs, or dietary supplements you use. Also tell them if you smoke, drink alcohol, or use illegal drugs. Some items may interact with your medicine. What should I watch for while using this medication? Visit your care team regularly. Tell your care team if your symptoms do not start to get better or if they get worse. You may need blood work done while you are taking this medication. You may need to follow a special diet. Talk to your care team. Foods that contain iron include: whole grains/cereals, dried fruits, beans, or peas, leafy green vegetables, and organ meats (liver, kidney). What side effects may I notice from receiving this medication? Side effects that you should report to your care team as soon as possible: Allergic reactions--skin rash, itching, hives, swelling of the face, lips, tongue, or throat Low blood pressure--dizziness, feeling faint or lightheaded, blurry vision Shortness of breath Side effects that usually do not require medical attention (report to your care team if  they continue or are bothersome): Flushing Headache Joint pain Muscle pain Nausea Pain, redness, or irritation at injection site This list may not describe all possible side effects. Call your doctor for medical advice about side effects. You may report side effects to FDA at 1-800-FDA-1088. Where should I keep my medication? This  medication is given in a hospital or clinic and will not be stored at home. NOTE: This sheet is a summary. It may not cover all possible information. If you have questions about this medicine, talk to your doctor, pharmacist, or health care provider.  2023 Elsevier/Gold Standard (2021-02-11 00:00:00)        To help prevent nausea and vomiting after your treatment, we encourage you to take your nausea medication as directed.  BELOW ARE SYMPTOMS THAT SHOULD BE REPORTED IMMEDIATELY: *FEVER GREATER THAN 100.4 F (38 C) OR HIGHER *CHILLS OR SWEATING *NAUSEA AND VOMITING THAT IS NOT CONTROLLED WITH YOUR NAUSEA MEDICATION *UNUSUAL SHORTNESS OF BREATH *UNUSUAL BRUISING OR BLEEDING *URINARY PROBLEMS (pain or burning when urinating, or frequent urination) *BOWEL PROBLEMS (unusual diarrhea, constipation, pain near the anus) TENDERNESS IN MOUTH AND THROAT WITH OR WITHOUT PRESENCE OF ULCERS (sore throat, sores in mouth, or a toothache) UNUSUAL RASH, SWELLING OR PAIN  UNUSUAL VAGINAL DISCHARGE OR ITCHING   Items with * indicate a potential emergency and should be followed up as soon as possible or go to the Emergency Department if any problems should occur.  Please show the CHEMOTHERAPY ALERT CARD or IMMUNOTHERAPY ALERT CARD at check-in to the Emergency Department and triage nurse.  Should you have questions after your visit or need to cancel or reschedule your appointment, please contact [facility] [phone]  and follow the prompts.  Office hours are 8:00 a.m. to 4:30 p.m. Monday - Friday. Please note that voicemails left after 4:00 p.m. may not be returned until the following business day.  We are closed weekends and major holidays. You have access to a nurse at all times for urgent questions. Please call the main number to the clinic [phone] and follow the prompts.  For any non-urgent questions, you may also contact your provider using MyChart. We now offer e-Visits for anyone  22 and older to request care online for non-urgent symptoms. For details visit [email or web address].   Also download the MyChart app! Go to the app store, search "MyChart", open the app, select [facility], and log in with your MyChart username and password.  Masks are optional in the cancer centers. If you would like for your care team to wear a mask while they are taking care of you, please let them know. For doctor visits, patients may have with them one support person who is at least 65 years old. At this time, visitors are not allowed in the infusion area.

## 2022-04-13 NOTE — Progress Notes (Signed)
Patient presents today for iron infusion.  Patient is in satisfactory condition with no complaints voiced.  BP at arrival was 181/63.  At recheck her BP was 197/72.  Tarri Abernethy, PA-C made aware.  She ordered Clonidine 0.2 mg x one dose today.  All other vitals were stable.  We will proceed with iron per provider orders.  Patient tolerated treatment well with no complaints voiced. BP at discharge was 144/56. Patient left ambulatory in stable condition.  Vital signs stable at discharge.  Follow up as scheduled.

## 2022-04-21 ENCOUNTER — Inpatient Hospital Stay (HOSPITAL_COMMUNITY): Payer: PPO

## 2022-04-21 VITALS — BP 167/75 | HR 63 | Temp 98.2°F | Resp 18

## 2022-04-21 DIAGNOSIS — D508 Other iron deficiency anemias: Secondary | ICD-10-CM

## 2022-04-21 DIAGNOSIS — D509 Iron deficiency anemia, unspecified: Secondary | ICD-10-CM | POA: Diagnosis not present

## 2022-04-21 MED ORDER — LORATADINE 10 MG PO TABS
10.0000 mg | ORAL_TABLET | Freq: Once | ORAL | Status: AC
  Administered 2022-04-21: 10 mg via ORAL
  Filled 2022-04-21: qty 1

## 2022-04-21 MED ORDER — SODIUM CHLORIDE 0.9 % IV SOLN: Freq: Once | INTRAVENOUS | Status: AC

## 2022-04-21 MED ORDER — SODIUM CHLORIDE 0.9 % IV SOLN
510.0000 mg | Freq: Once | INTRAVENOUS | Status: AC
  Administered 2022-04-21: 510 mg via INTRAVENOUS
  Filled 2022-04-21: qty 510

## 2022-04-21 MED ORDER — ACETAMINOPHEN 325 MG PO TABS
650.0000 mg | ORAL_TABLET | Freq: Once | ORAL | Status: AC
  Administered 2022-04-21: 650 mg via ORAL
  Filled 2022-04-21: qty 2

## 2022-04-21 NOTE — Progress Notes (Signed)
Pt presents today for Feraheme IV iron infusion per provider's order. Vital signs stable and pt voiced no new complaints at this time.  Peripheral IV started with good blood return pre and post infusion.  Feraheme given today per MD orders. Tolerated infusion without adverse affects. Vital signs stable. No complaints at this time. Discharged from clinic ambulatory in stable condition. Alert and oriented x 3. F/U with [facility] as scheduled.    

## 2022-04-21 NOTE — Patient Instructions (Signed)
[facility]  Discharge Instructions: Thank you for choosing [facility] to provide your oncology and hematology care.  If you have a lab appointment with the Cancer Center, please come in thru the Main Entrance and check in at the main information desk.  Wear comfortable clothing and clothing appropriate for easy access to any Portacath or PICC line.   We strive to give you quality time with your provider. You may need to reschedule your appointment if you arrive late (15 or more minutes).  Arriving late affects you and other patients whose appointments are after yours.  Also, if you miss three or more appointments without notifying the office, you may be dismissed from the clinic at the provider's discretion.      For prescription refill requests, have your pharmacy contact our office and allow 72 hours for refills to be completed.    Today you received Feraheme IV iron     BELOW ARE SYMPTOMS THAT SHOULD BE REPORTED IMMEDIATELY: *FEVER GREATER THAN 100.4 F (38 C) OR HIGHER *CHILLS OR SWEATING *NAUSEA AND VOMITING THAT IS NOT CONTROLLED WITH YOUR NAUSEA MEDICATION *UNUSUAL SHORTNESS OF BREATH *UNUSUAL BRUISING OR BLEEDING *URINARY PROBLEMS (pain or burning when urinating, or frequent urination) *BOWEL PROBLEMS (unusual diarrhea, constipation, pain near the anus) TENDERNESS IN MOUTH AND THROAT WITH OR WITHOUT PRESENCE OF ULCERS (sore throat, sores in mouth, or a toothache) UNUSUAL RASH, SWELLING OR PAIN  UNUSUAL VAGINAL DISCHARGE OR ITCHING   Items with * indicate a potential emergency and should be followed up as soon as possible or go to the Emergency Department if any problems should occur.  Please show the CHEMOTHERAPY ALERT CARD or IMMUNOTHERAPY ALERT CARD at check-in to the Emergency Department and triage nurse.  Should you have questions after your visit or need to cancel or reschedule your appointment, please contact [facility]  [phone]  and follow the prompts.  Office hours are 8:00 a.m. to 4:30 p.m. Monday - Friday. Please note that voicemails left after 4:00 p.m. may not be returned until the following business day.  We are closed weekends and major holidays. You have access to a nurse at all times for urgent questions. Please call the main number to the clinic [phone] and follow the prompts.  For any non-urgent questions, you may also contact your provider using MyChart. We now offer e-Visits for anyone 18 and older to request care online for non-urgent symptoms. For details visit [email or web address].   Also download the MyChart app! Go to the app store, search "MyChart", open the app, select [place], and log in with your MyChart username and password.  Masks are optional in the cancer centers. If you would like for your care team to wear a mask while they are taking care of you, please let them know. For doctor visits, patients may have with them one support person who is at least 65 years old. At this time, visitors are not allowed in the infusion area.  

## 2022-04-24 ENCOUNTER — Encounter: Payer: Self-pay | Admitting: Registered Nurse

## 2022-04-25 ENCOUNTER — Other Ambulatory Visit: Payer: Self-pay | Admitting: Registered Nurse

## 2022-04-25 ENCOUNTER — Other Ambulatory Visit: Payer: Self-pay | Admitting: Family Medicine

## 2022-04-25 DIAGNOSIS — I10 Essential (primary) hypertension: Secondary | ICD-10-CM

## 2022-04-25 DIAGNOSIS — F411 Generalized anxiety disorder: Secondary | ICD-10-CM

## 2022-04-25 DIAGNOSIS — J449 Chronic obstructive pulmonary disease, unspecified: Secondary | ICD-10-CM

## 2022-04-25 DIAGNOSIS — E1140 Type 2 diabetes mellitus with diabetic neuropathy, unspecified: Secondary | ICD-10-CM

## 2022-04-25 DIAGNOSIS — E1169 Type 2 diabetes mellitus with other specified complication: Secondary | ICD-10-CM

## 2022-04-25 DIAGNOSIS — K31819 Angiodysplasia of stomach and duodenum without bleeding: Secondary | ICD-10-CM

## 2022-04-25 DIAGNOSIS — M549 Dorsalgia, unspecified: Secondary | ICD-10-CM

## 2022-04-25 DIAGNOSIS — K279 Peptic ulcer, site unspecified, unspecified as acute or chronic, without hemorrhage or perforation: Secondary | ICD-10-CM

## 2022-04-25 MED ORDER — FREESTYLE LIBRE 14 DAY READER DEVI: 0 refills | Status: DC

## 2022-04-25 MED ORDER — FLUOXETINE HCL 40 MG PO CAPS: 40.0000 mg | ORAL_CAPSULE | Freq: Every day | ORAL | 3 refills | Status: DC

## 2022-04-25 MED ORDER — ALPRAZOLAM 0.5 MG PO TABS: ORAL_TABLET | ORAL | 2 refills | Status: DC

## 2022-04-25 MED ORDER — PANTOPRAZOLE SODIUM 40 MG PO TBEC: DELAYED_RELEASE_TABLET | ORAL | 3 refills | Status: DC

## 2022-04-25 MED ORDER — ALBUTEROL SULFATE HFA 108 (90 BASE) MCG/ACT IN AERS: INHALATION_SPRAY | RESPIRATORY_TRACT | 5 refills | Status: DC

## 2022-04-25 MED ORDER — GABAPENTIN 300 MG PO CAPS: ORAL_CAPSULE | ORAL | 1 refills | Status: DC

## 2022-04-25 MED ORDER — CYCLOBENZAPRINE HCL 5 MG PO TABS: 5.0000 mg | ORAL_TABLET | Freq: Three times a day (TID) | ORAL | 3 refills | Status: DC | PRN

## 2022-04-25 MED ORDER — CLOPIDOGREL BISULFATE 75 MG PO TABS: ORAL_TABLET | ORAL | 1 refills | Status: DC

## 2022-04-25 MED ORDER — LOSARTAN POTASSIUM 100 MG PO TABS: 100.0000 mg | ORAL_TABLET | Freq: Every day | ORAL | 1 refills | Status: DC

## 2022-04-25 MED ORDER — ATORVASTATIN CALCIUM 40 MG PO TABS: 40.0000 mg | ORAL_TABLET | Freq: Every day | ORAL | 1 refills | Status: DC

## 2022-04-25 MED ORDER — BISOPROLOL FUMARATE 10 MG PO TABS: ORAL_TABLET | ORAL | 1 refills | Status: DC

## 2022-04-25 NOTE — Telephone Encounter (Signed)
Sent ? ?Thanks, ? ?Rich

## 2022-04-26 ENCOUNTER — Other Ambulatory Visit: Payer: Self-pay

## 2022-04-26 ENCOUNTER — Encounter: Payer: Self-pay | Admitting: Registered Nurse

## 2022-04-26 ENCOUNTER — Ambulatory Visit (INDEPENDENT_AMBULATORY_CARE_PROVIDER_SITE_OTHER): Payer: PPO | Admitting: Registered Nurse

## 2022-04-26 VITALS — BP 180/80 | HR 81 | Temp 98.1°F | Resp 18 | Ht 65.0 in | Wt 131.0 lb

## 2022-04-26 DIAGNOSIS — K31819 Angiodysplasia of stomach and duodenum without bleeding: Secondary | ICD-10-CM

## 2022-04-26 DIAGNOSIS — J449 Chronic obstructive pulmonary disease, unspecified: Secondary | ICD-10-CM

## 2022-04-26 DIAGNOSIS — F411 Generalized anxiety disorder: Secondary | ICD-10-CM

## 2022-04-26 DIAGNOSIS — I10 Essential (primary) hypertension: Secondary | ICD-10-CM | POA: Diagnosis not present

## 2022-04-26 DIAGNOSIS — E1140 Type 2 diabetes mellitus with diabetic neuropathy, unspecified: Secondary | ICD-10-CM | POA: Diagnosis not present

## 2022-04-26 DIAGNOSIS — K279 Peptic ulcer, site unspecified, unspecified as acute or chronic, without hemorrhage or perforation: Secondary | ICD-10-CM

## 2022-04-26 DIAGNOSIS — E1169 Type 2 diabetes mellitus with other specified complication: Secondary | ICD-10-CM

## 2022-04-26 LAB — COMPREHENSIVE METABOLIC PANEL
ALT: 14 U/L (ref 0–35)
AST: 12 U/L (ref 0–37)
Albumin: 4 g/dL (ref 3.5–5.2)
Alkaline Phosphatase: 96 U/L (ref 39–117)
BUN: 10 mg/dL (ref 6–23)
CO2: 29 mEq/L (ref 19–32)
Calcium: 9.7 mg/dL (ref 8.4–10.5)
Chloride: 102 mEq/L (ref 96–112)
Creatinine, Ser: 0.71 mg/dL (ref 0.40–1.20)
GFR: 89.71 mL/min (ref >60.00)
Glucose, Bld: 154 mg/dL — ABNORMAL HIGH (ref 70–99)
Potassium: 3.4 mEq/L — ABNORMAL LOW (ref 3.5–5.1)
Sodium: 139 mEq/L (ref 135–145)
Total Bilirubin: 0.4 mg/dL (ref 0.2–1.2)
Total Protein: 7.4 g/dL (ref 6.0–8.3)

## 2022-04-26 LAB — HEMOGLOBIN A1C: Hgb A1c MFr Bld: 7.1 % — ABNORMAL HIGH (ref 4.6–6.5)

## 2022-04-26 MED ORDER — GABAPENTIN 300 MG PO CAPS: ORAL_CAPSULE | ORAL | 1 refills | Status: DC

## 2022-04-26 MED ORDER — PANTOPRAZOLE SODIUM 40 MG PO TBEC: DELAYED_RELEASE_TABLET | ORAL | 3 refills | Status: DC

## 2022-04-26 MED ORDER — ALBUTEROL SULFATE (2.5 MG/3ML) 0.083% IN NEBU: 2.5000 mg | INHALATION_SOLUTION | RESPIRATORY_TRACT | 5 refills | Status: DC | PRN

## 2022-04-26 MED ORDER — ALBUTEROL SULFATE HFA 108 (90 BASE) MCG/ACT IN AERS: INHALATION_SPRAY | RESPIRATORY_TRACT | 5 refills | Status: DC

## 2022-04-26 MED ORDER — FLUOXETINE HCL 40 MG PO CAPS: 40.0000 mg | ORAL_CAPSULE | Freq: Every day | ORAL | 3 refills | Status: DC

## 2022-04-26 MED ORDER — FREESTYLE LIBRE 2 SENSOR MISC: 1 refills | Status: DC

## 2022-04-26 MED ORDER — CLOPIDOGREL BISULFATE 75 MG PO TABS: ORAL_TABLET | ORAL | 1 refills | Status: DC

## 2022-04-26 MED ORDER — ATORVASTATIN CALCIUM 40 MG PO TABS: 40.0000 mg | ORAL_TABLET | Freq: Every day | ORAL | 1 refills | Status: DC

## 2022-04-26 NOTE — Assessment & Plan Note (Signed)
Uncontrolled. We have given extensive advice on dietary and lifestyle measures to take to control BP. She will see cardiology in coming weeks to further manage her BP

## 2022-04-26 NOTE — Progress Notes (Signed)
Established Patient Office Visit  Subjective:  [name], adult    DOB: [birth year]  Age: 66 y.o. MRN: [id number]  CC:  Chief Complaint  Patient presents with   Medication Refill    Patient states she is here for a medication refill and discuss moving foward    HPI Tammy Mcpherson presents for htn  Hypertension: Patient Currently taking: losartan 141m po qd, spironolactone 57mpo qd Good effect. No AEs. Denies CV symptoms including: chest pain, shob, doe, headache, visual changes, fatigue, claudication, and dependent edema.   Previous readings and labs: BP Readings from Last 3 Encounters:  04/26/22 (!) 180/80  04/21/22 (!) 167/75  04/13/22 (!) 144/56   Lab Results  Component Value Date   CREATININE 0.64 08/12/2021      Outpatient Medications Prior to Visit  Medication Sig Dispense Refill   acetaminophen (TYLENOL) 500 MG tablet Take 500 mg by mouth at bedtime as needed for moderate pain.     ALPRAZolam (XANAX) 0.5 MG tablet TAKE ONE TABLET (0.5MG TOTAL) BY MOUTH DAILY AS NEEDED FOR ANXIETY 30 tablet 2   aspirin 81 MG chewable tablet Chew 81 mg by mouth every morning.      bisoprolol (ZEBETA) 10 MG tablet TAKE ONE (1) TABLET BY MOUTH EVERY DAY 90 tablet 1   blood glucose meter kit and supplies Dispense based on patient and insurance preference. Use up to four times daily as directed. (FOR ICD-10 E10.9, E11.9). 1 each 0   Budeson-Glycopyrrol-Formoterol (BREZTRI AEROSPHERE) 160-9-4.8 MCG/ACT AERO Inhale 2 puffs into the lungs 2 (two) times daily. 11.8 g 12   Cholecalciferol (VITAMIN D) 50 MCG (2000 UT) CAPS Take 1 capsule (2,000 Units total) by mouth daily. 90 capsule 4   Continuous Blood Gluc Receiver (FREESTYLE LIBRE 14 DAY READER) DEVI See admin instructions. 4 each 0   Continuous Blood Gluc Receiver (FREESTYLE LIBRE 2 READER) DEVI Check blood glucose up to 4 times daily 1 each 0   Continuous Blood Gluc Sensor (FREESTYLE LIBRE 2 SENSOR) MISC See admin  instructions.     cyanocobalamin (,VITAMIN B-12,) 1000 MCG/ML injection INJECT 1 ML INTO THE MUSCLE EVERY 30 DAYS. 1 mL 0   cyclobenzaprine (FLEXERIL) 5 MG tablet Take 1 tablet (5 mg total) by mouth 3 (three) times daily as needed for muscle spasms. 45 tablet 3   diclofenac (VOLTAREN) 75 MG EC tablet Take 1 tablet (75 mg total) by mouth 2 (two) times daily. 30 tablet 0   dicyclomine (BENTYL) 10 MG capsule Take 10 mg by mouth as needed for spasms.     empagliflozin (JARDIANCE) 10 MG TABS tablet Take 1 tablet (10 mg total) by mouth daily before breakfast. 90 tablet 1   glucose blood (ONETOUCH ULTRA) test strip Use as instructed 100 each 12   glucose monitoring kit (FREESTYLE) monitoring kit Use to check blood sugar BID as directed 1 each 0   lactase (LACTAID) 3000 units tablet Take by mouth as needed.     Lancets (ONETOUCH DELICA PLUS [id number] 1 each by Does not apply route 2 (two) times a day. 100 each 3   losartan (COZAAR) 100 MG tablet Take 1 tablet (100 mg total) by mouth daily. 90 tablet 1   Misc. Devices MISC Please provide supplies (needle, syringe, alcohol swabs) needed for patient to self-administer B-12 injections monthly. 1 each 12   spironolactone (ALDACTONE) 50 MG tablet Take 1 tablet (50 mg total) by mouth daily. 90 tablet 1  [employer] TUBERCULIN SAFETY SYR 25G X 1" 1 ML MISC USE TO ADMINISTER INJECTABLE VITAMIN B12. 1 each 0   albuterol (PROVENTIL) (2.5 MG/3ML) 0.083% nebulizer solution Take 3 mLs (2.5 mg total) by nebulization every 4 (four) hours as needed for wheezing or shortness of breath. 75 mL 5   albuterol (VENTOLIN HFA) 108 (90 Base) MCG/ACT inhaler INHALE TWO PUFFS INTO THE LUNGS EVERY SIX HOURS AS NEEDED FOR WHEEZING OR SHORTNESS OF BREATH 8.5 g 5   atorvastatin (LIPITOR) 40 MG tablet Take 1 tablet (40 mg total) by mouth daily. 90 tablet 1   clopidogrel (PLAVIX) 75 MG tablet TAKE ONE TABLET (75MG TOTAL) BY MOUTH DAILY WITH BREAKFAST 90 tablet 1   Continuous Blood Gluc  Sensor (FREESTYLE LIBRE 2 SENSOR) MISC Apply one sensor to the upper arm avery 14 days. 6 each 1   FLUoxetine (PROZAC) 40 MG capsule Take 1 capsule (40 mg total) by mouth daily. 90 capsule 3   gabapentin (NEURONTIN) 300 MG capsule TAKE ONE CAPSULE (300 MG TOTAL) BY MOUTHTWO TIMES DAILY. 180 capsule 1   pantoprazole (PROTONIX) 40 MG tablet TAKE ONE (1) TABLET 30 MINS BEFORE YOUR FIRST MEAL. 90 tablet 3   No facility-administered medications prior to visit.    Review of Systems  Constitutional: Negative.   HENT: Negative.    Eyes: Negative.   Respiratory: Negative.    Cardiovascular: Negative.   Gastrointestinal: Negative.   Genitourinary: Negative.   Musculoskeletal: Negative.   Skin: Negative.   Neurological: Negative.   Psychiatric/Behavioral: Negative.    All other systems reviewed and are negative.     Objective:     BP (!) 180/80   Pulse 81   Temp 98.1 F (36.7 C) (Temporal)   Resp 18   Ht '5\' 5"'  (1.651 m)   Wt 131 lb (59.4 kg)   SpO2 93%   BMI 21.80 kg/m   Wt Readings from Last 3 Encounters:  04/26/22 131 lb (59.4 kg)  02/09/22 131 lb 3.2 oz (59.5 kg)  11/30/21 129 lb 6.4 oz (58.7 kg)   Physical Exam Constitutional:      General: She is not in acute distress.    Appearance: Normal appearance. She is normal weight. She is not ill-appearing, toxic-appearing or diaphoretic.  Cardiovascular:     Rate and Rhythm: Normal rate and regular rhythm.     Heart sounds: Normal heart sounds. No murmur heard.    No friction rub. No gallop.  Pulmonary:     Effort: Pulmonary effort is normal. No respiratory distress.     Breath sounds: Normal breath sounds. No stridor. No wheezing, rhonchi or rales.  Chest:     Chest wall: No tenderness.  Neurological:     General: No focal deficit present.     Mental Status: She is alert and oriented to person, place, and time. Mental status is at baseline.  Psychiatric:        Mood and Affect: Mood normal.        Behavior: Behavior  normal.        Thought Content: Thought content normal.        Judgment: Judgment normal.     No results found for any visits on 04/26/22.    The 10-year ASCVD risk score (Arnett DK, et al., 2019) is: 43%    Assessment & Plan:   Problem List Items Addressed This Visit       Cardiovascular and Mediastinum   Essential hypertension    Uncontrolled. We  have given extensive advice on dietary and lifestyle measures to take to control BP. She will see cardiology in coming weeks to further manage her BP      Relevant Medications   atorvastatin (LIPITOR) 40 MG tablet   clopidogrel (PLAVIX) 75 MG tablet   Other Relevant Orders   Microalbumin / creatinine urine ratio   Comprehensive metabolic panel   Gastric AVM    Continue plavix.       Relevant Medications   atorvastatin (LIPITOR) 40 MG tablet   clopidogrel (PLAVIX) 75 MG tablet   pantoprazole (PROTONIX) 40 MG tablet     Respiratory   Chronic obstructive pulmonary disease (HCC)    Continue to follow with pulmonary. Will provide refill of albuterol at this time.      Relevant Medications   albuterol (PROVENTIL) (2.5 MG/3ML) 0.083% nebulizer solution   albuterol (VENTOLIN HFA) 108 (90 Base) MCG/ACT inhaler   clopidogrel (PLAVIX) 75 MG tablet     Digestive   Peptic ulcer disease    Continue on PPI. No red flags at this time.      Relevant Medications   clopidogrel (PLAVIX) 75 MG tablet   pantoprazole (PROTONIX) 40 MG tablet     Endocrine   Type 2 diabetes mellitus ([place]) - Primary    Has been well controlled on last few checks. Recheck A1c today, maintain current therapy unless outside of goal range. Recheck in 3-6 mo      Relevant Medications   atorvastatin (LIPITOR) 40 MG tablet   clopidogrel (PLAVIX) 75 MG tablet   Continuous Blood Gluc Sensor (FREESTYLE LIBRE 2 SENSOR) MISC   gabapentin (NEURONTIN) 300 MG capsule   Other Relevant Orders   Hemoglobin A1c   Diabetes mellitus (HCC)    Recheck A1c and treat as  indicated.      Relevant Medications   atorvastatin (LIPITOR) 40 MG tablet   clopidogrel (PLAVIX) 75 MG tablet   gabapentin (NEURONTIN) 300 MG capsule   Other Relevant Orders   Hemoglobin A1c     Other   GAD (generalized anxiety disorder)    Continue alprazolam. Reviewed pdmp, no refill due at this time. Fluoxetine working well as daily ssri.  Reviewed risks, benefits, and side effects, pt voices understanding. Follow up in 6-12 mo      Relevant Medications   clopidogrel (PLAVIX) 75 MG tablet   FLUoxetine (PROZAC) 40 MG capsule    Meds ordered this encounter  Medications   albuterol (PROVENTIL) (2.5 MG/3ML) 0.083% nebulizer solution    Sig: Take 3 mLs (2.5 mg total) by nebulization every 4 (four) hours as needed for wheezing or shortness of breath.    Dispense:  75 mL    Refill:  5    Dx: J44.9    Order Specific Question:   Supervising Provider    Answer:   Carlota Raspberry, JEFFREY R [[id number]]   albuterol (VENTOLIN HFA) 108 (90 Base) MCG/ACT inhaler    Sig: INHALE TWO PUFFS INTO THE LUNGS EVERY SIX HOURS AS NEEDED FOR WHEEZING OR SHORTNESS OF BREATH    Dispense:  8.5 g    Refill:  5    Order Specific Question:   Supervising Provider    Answer:   Carlota Raspberry, JEFFREY R [[id number]]   atorvastatin (LIPITOR) 40 MG tablet    Sig: Take 1 tablet (40 mg total) by mouth daily.    Dispense:  90 tablet    Refill:  1    Order Specific Question:   Supervising Provider  Answer:   Carlota Raspberry, JEFFREY R [[id number]]   clopidogrel (PLAVIX) 75 MG tablet    Sig: TAKE ONE TABLET (75MG TOTAL) BY MOUTH DAILY WITH BREAKFAST    Dispense:  90 tablet    Refill:  1    Order Specific Question:   Supervising Provider    Answer:   Carlota Raspberry, JEFFREY R [[id number]]   Continuous Blood Gluc Sensor (FREESTYLE LIBRE 2 SENSOR) MISC    Sig: Apply one sensor to the upper arm avery 14 days.    Dispense:  6 each    Refill:  1    Order Specific Question:   Supervising Provider    Answer:   Carlota Raspberry, JEFFREY R [[id number]]   FLUoxetine (PROZAC) 40  MG capsule    Sig: Take 1 capsule (40 mg total) by mouth daily.    Dispense:  90 capsule    Refill:  3    Order Specific Question:   Supervising Provider    Answer:   Carlota Raspberry, JEFFREY R [[id number]]   gabapentin (NEURONTIN) 300 MG capsule    Sig: TAKE ONE CAPSULE (300 MG TOTAL) BY MOUTHTWO TIMES DAILY.    Dispense:  180 capsule    Refill:  1    Order Specific Question:   Supervising Provider    Answer:   Carlota Raspberry, JEFFREY R [[id number]]   pantoprazole (PROTONIX) 40 MG tablet    Sig: TAKE ONE (1) TABLET 30 MINS BEFORE YOUR FIRST MEAL.    Dispense:  90 tablet    Refill:  3    PATIENT REQUESTING A 90 DAY SUPPLY WITH REFILLS    Order Specific Question:   Supervising Provider    Answer:   Carlota Raspberry, JEFFREY R [[id number]]    Return in about 6 months (around 10/27/2022) for Chronic Conditions.    [name], NP

## 2022-04-26 NOTE — Assessment & Plan Note (Signed)
Continue plavix °

## 2022-04-26 NOTE — Assessment & Plan Note (Signed)
Recheck A1c and treat as indicated.

## 2022-04-26 NOTE — Assessment & Plan Note (Signed)
Has been well controlled on last few checks. Recheck A1c today, maintain current therapy unless outside of goal range. Recheck in 3-6 mo

## 2022-04-26 NOTE — Assessment & Plan Note (Signed)
Continue alprazolam. Reviewed pdmp, no refill due at this time. Fluoxetine working well as daily ssri.  Reviewed risks, benefits, and side effects, pt voices understanding. Follow up in 6-12 mo

## 2022-04-26 NOTE — Assessment & Plan Note (Signed)
Continue to follow with pulmonary. Will provide refill of albuterol at this time.

## 2022-04-26 NOTE — Patient Instructions (Addendum)
Tammy Mcpherson -   So great to see you!   Please stay well. I recommend establishing with one of these providers for a follow up on blood pressure in around 6-8 weeks:  Inda Coke, [place] Berniece Pap, MD Myrna Blazer Early, NP Jeralyn Ruths, DNP   Avoid salty foods, processed foods, and drink plenty of water. Continue taking losartan and spironolactone.   Thank you for letting me take part in your care,  [name]   If you have lab work done today you will be contacted with your lab results within the next 2 weeks.  If you have not heard from [place] then please contact us. The fastest way to get your results is to register for My Chart.   IF you received an x-ray today, you will receive an invoice from [facility]. Please contact [facility] at [phone] with questions or concerns regarding your invoice.   IF you received labwork today, you will receive an invoice from [facility]. Please contact [facility] at [phone] with questions or concerns regarding your invoice.   Our billing staff will not be able to assist you with questions regarding bills from these companies.  You will be contacted with the lab results as soon as they are available. The fastest way to get your results is to activate your My Chart account. Instructions are located on the last page of this paperwork. If you have not heard from [place] regarding the results in 2 weeks, please contact this office.

## 2022-04-26 NOTE — Assessment & Plan Note (Signed)
Continue on PPI. No red flags at this time.

## 2022-04-26 NOTE — Addendum Note (Signed)
Addended by: [name] on: 04/26/2022 03:06 PM   Modules accepted: Orders

## 2022-04-26 NOTE — Addendum Note (Signed)
Addended by: [name] on: 04/26/2022 03:05 PM   Modules accepted: Orders

## 2022-04-27 ENCOUNTER — Other Ambulatory Visit: Payer: PPO

## 2022-04-27 DIAGNOSIS — E11628 Type 2 diabetes mellitus with other skin complications: Secondary | ICD-10-CM

## 2022-04-27 LAB — URINALYSIS WITH CULTURE, IF INDICATED
Nitrite: NEGATIVE
Specific Gravity, Urine: 1.02 (ref 1.000–1.030)
Total Protein, Urine: 100 — AB
Urine Glucose: NEGATIVE
Urobilinogen, UA: 0.2 (ref 0.0–1.0)
pH: 6 (ref 5.0–8.0)

## 2022-04-27 LAB — MICROALBUMIN / CREATININE URINE RATIO
Creatinine,U: 141.7 mg/dL
Microalb Creat Ratio: 15.7 mg/g (ref 0.0–30.0)
Microalb, Ur: 22.3 mg/dL — ABNORMAL HIGH (ref 0.0–1.9)

## 2022-04-29 LAB — URINE CULTURE
MICRO NUMBER:: 13702721
SPECIMEN QUALITY:: ADEQUATE

## 2022-05-01 ENCOUNTER — Other Ambulatory Visit: Payer: Self-pay | Admitting: Registered Nurse

## 2022-05-01 DIAGNOSIS — N3000 Acute cystitis without hematuria: Secondary | ICD-10-CM

## 2022-05-01 MED ORDER — NITROFURANTOIN MONOHYD MACRO 100 MG PO CAPS: 100.0000 mg | ORAL_CAPSULE | Freq: Two times a day (BID) | ORAL | 0 refills | Status: AC

## 2022-05-09 ENCOUNTER — Other Ambulatory Visit (HOSPITAL_COMMUNITY): Payer: Self-pay | Admitting: Hematology

## 2022-06-07 ENCOUNTER — Inpatient Hospital Stay: Payer: PPO | Attending: Hematology | Admitting: Cardiology

## 2022-06-07 ENCOUNTER — Other Ambulatory Visit (HOSPITAL_COMMUNITY): Payer: Self-pay | Admitting: Hematology

## 2022-06-07 ENCOUNTER — Encounter: Payer: Self-pay | Admitting: Cardiology

## 2022-06-07 VITALS — BP 152/78 | HR 67 | Ht 65.0 in | Wt 134.6 lb

## 2022-06-07 DIAGNOSIS — E782 Mixed hyperlipidemia: Secondary | ICD-10-CM | POA: Diagnosis not present

## 2022-06-07 DIAGNOSIS — I10 Essential (primary) hypertension: Secondary | ICD-10-CM

## 2022-06-07 DIAGNOSIS — I739 Peripheral vascular disease, unspecified: Secondary | ICD-10-CM | POA: Diagnosis not present

## 2022-06-07 DIAGNOSIS — Z8679 Personal history of other diseases of the circulatory system: Secondary | ICD-10-CM

## 2022-06-07 DIAGNOSIS — Z79899 Other long term (current) drug therapy: Secondary | ICD-10-CM

## 2022-06-07 MED ORDER — ATORVASTATIN CALCIUM 80 MG PO TABS: 80.0000 mg | ORAL_TABLET | Freq: Every day | ORAL | 3 refills | Status: DC

## 2022-06-07 MED ORDER — ENTRESTO 49-51 MG PO TABS: 1.0000 | ORAL_TABLET | Freq: Two times a day (BID) | ORAL | 6 refills | Status: DC

## 2022-06-07 NOTE — Patient Instructions (Addendum)
Medication Instructions:  Your physician has recommended you make the following change in your medication:  Increase atorvastatin to 80 mg daily Stop losartan Start entresto 49/51 mg one tablet by mouth twice daily  Labwork: BMET in 2 weeks after starting entresto Non-fasting [facility]  Testing/Procedures: Your physician has requested that you have an echocardiogram. Echocardiography is a painless test that uses sound waves to create images of your heart. It provides your doctor with information about the size and shape of your heart and how well your heart's chambers and valves are working. This procedure takes approximately one hour. There are no restrictions for this procedure.  Follow-Up: Your physician recommends that you schedule a follow-up appointment in: 6 months  Any Other Special Instructions Will Be Listed Below (If Applicable).  If you need a refill on your cardiac medications before your next appointment, please call your pharmacy.

## 2022-06-07 NOTE — Progress Notes (Signed)
Cardiology Office Note  Date: 06/07/2022   ID: Tammy Mcpherson, DOB [birth year], MRN [id number]  PCP:  [name], NP  Cardiologist:  Rozann Lesches, MD Electrophysiologist:  None   Chief Complaint  Patient presents with   Cardiac follow-up    History of Present Illness: Tammy Mcpherson is a 66 y.o. adult last seen in March.  She is here for a follow-up visit.  I reviewed the interval chart, she was just seen by her PCP in July, no changes made in her blood pressure regimen.  She does not report any angina, indicates NYHA class II dyspnea with typical activities.  She has had no palpitations or syncope.  She has follow-up pending with [facility] for reevaluation of PAD.  Continues on both aspirin and Plavix.  I reviewed her lipid panel from late last year, LDL 87.  We discussed increasing Lipitor to 80 mg daily.  She has not been able to quit smoking over time.  I personally reviewed her ECG today which shows normal sinus rhythm.  We went over her medications from a cardiac perspective and discussed getting a follow-up echocardiogram in comparison to study from January 2022 at which point LVEF had normalized to the range of 60 to 65%.  Also plan to check into her cost for switching from Cozaar to [facility] as this may provide better blood pressure control overall.  Past Medical History:  Diagnosis Date   Anemia 04/30/2016   Anxiety    Arthritis    Cardiomyopathy ([place])    a. EF 40-45% by echo in 04/2016 b. Improved to 60-65% by repeat imaging in 2018   COPD (chronic obstructive pulmonary disease) (HCC)    Coronary artery calcification seen on CT scan    Essential hypertension    GERD (gastroesophageal reflux disease)    Headache    History of bronchitis    History of GI bleed    History of hiatal hernia    Hyperlipidemia    Iron deficiency anemia    Peripheral vascular disease (HCC)    Pollen allergy    PSVT (paroxysmal supraventricular tachycardia) (HCC)    PVC's (premature  ventricular contractions)    Type 2 diabetes mellitus ([place])    Vitamin B12 deficiency     Past Surgical History:  Procedure Laterality Date   ABDOMINAL AORTOGRAM W/LOWER EXTREMITY Bilateral 05/19/2021   Procedure: ABDOMINAL AORTOGRAM W/LOWER EXTREMITY;  Surgeon: [name], MD;  Location: [place] [facility];  Service: Cardiovascular;  Laterality: Bilateral;   ABDOMINAL HYSTERECTOMY     polyps  about age 59   AIKEN OSTEOTOMY Right 07/10/2013   Procedure: Barbie Banner OSTEOTOMY RIGHT FOOT;  Surgeon: Marcheta Grammes, DPM;  Location: [facility];  Service: Orthopedics;  Laterality: Right;   AMPUTATION Left 05/09/2017   Procedure: AMPUTATION 5TH TOE LEFT FOOT;  Surgeon: [name], DPM;  Location: [facility];  Service: Podiatry;  Laterality: Left;   AMPUTATION Left 06/13/2017   Procedure: AMPUTATION 2ND TOE LEFT FOOT;  Surgeon: [name], DPM;  Location: [facility];  Service: Podiatry;  Laterality: Left;   AMPUTATION Right 06/01/2021   Procedure: Right great toe and Second Toe Amputation;  Surgeon: [name], MD;  Location: [place];  Service: Vascular;  Laterality: Right;   BUNIONECTOMY Right 07/10/2013   Procedure: Prudy Feeler BUNIONECTOMY RIGHT FOOT;  Surgeon: Marcheta Grammes, DPM;  Location: [facility];  Service: Orthopedics;  Laterality: Right;   CHOLECYSTECTOMY N/A 08/22/2017   Procedure: LAPAROSCOPIC CHOLECYSTECTOMY;  Surgeon:  Virl Cagey, MD;  Location: [facility];  Service: General;  Laterality: N/A;   COLONOSCOPY N/A 07/07/2016   Dr. Oneida Alar; redundant left colon, diverticulosis at hepatic flexure, non-bleeding internal hemorrhoids   ESOPHAGOGASTRODUODENOSCOPY N/A 07/07/2016   Dr. Oneida Alar: many non-bleeding cratered gastric ulcers without stigmata of bleeding in gastric antrum. four 2-3 mm angioectasias without bleeding in duodenal bulb and second portion of duodenum s/p APC. Chroni gastritis on path.    ESOPHAGOGASTRODUODENOSCOPY N/A 08/03/2017   Dr. Oneida Alar: erosive gastritis,  AVMs. Found a single non-bleeding angioectasia in stomach, s/p APC therapy. Four non-bleeding angioectasias in duodenum s/p APC. Non-bleeding erosive gastropathy   IR ANGIOGRAM EXTREMITY LEFT  04/24/2017   IR FEM POP ART ATHERECT INC PTA MOD SED  04/24/2017   IR INFUSION THROMBOL ARTERIAL INITIAL (MS)  04/24/2017   IR RADIOLOGIST EVAL & MGMT  12/05/2016   IR US GUIDE VASC ACCESS RIGHT  04/24/2017   LIVER BIOPSY N/A 08/22/2017   Procedure: LIVER BIOPSY;  Surgeon: Virl Cagey, MD;  Location: [facility];  Service: General;  Laterality: N/A;   METATARSAL HEAD EXCISION Right 07/10/2013   Procedure: METATARSAL HEAD RESECTION OF DIGITS 2 AND 3 RIGHT FOOT;  Surgeon: Marcheta Grammes, DPM;  Location: [facility];  Service: Orthopedics;  Laterality: Right;   PERIPHERAL VASCULAR INTERVENTION Right 05/19/2021   Procedure: PERIPHERAL VASCULAR INTERVENTION;  Surgeon: [name], MD;  Location: [place] [facility];  Service: Cardiovascular;  Laterality: Right;   PROXIMAL INTERPHALANGEAL FUSION (PIP) Right 07/10/2013   Procedure: ARTHRODESIS PIPJ  2ND DIGIT RIGHT FOOT;  Surgeon: Marcheta Grammes, DPM;  Location: [facility];  Service: Orthopedics;  Laterality: Right;    Current Outpatient Medications  Medication Sig Dispense Refill   acetaminophen (TYLENOL) 500 MG tablet Take 500 mg by mouth at bedtime as needed for moderate pain.     albuterol (PROVENTIL) (2.5 MG/3ML) 0.083% nebulizer solution Take 3 mLs (2.5 mg total) by nebulization every 4 (four) hours as needed for wheezing or shortness of breath. 75 mL 5   albuterol (VENTOLIN HFA) 108 (90 Base) MCG/ACT inhaler INHALE TWO PUFFS INTO THE LUNGS EVERY SIX HOURS AS NEEDED FOR WHEEZING OR SHORTNESS OF BREATH 8.5 g 5   ALPRAZolam (XANAX) 0.5 MG tablet TAKE ONE TABLET (0.$RemoveBefor'5MG'YszXZrmvHkfX$  TOTAL) BY MOUTH DAILY AS NEEDED FOR ANXIETY 30 tablet 2   aspirin 81 MG chewable tablet Chew 81 mg by mouth every morning.      atorvastatin (LIPITOR) 80 MG tablet Take 1 tablet (80 mg  total) by mouth daily. 90 tablet 3   bisoprolol (ZEBETA) 10 MG tablet TAKE ONE (1) TABLET BY MOUTH EVERY DAY 90 tablet 1   blood glucose meter kit and supplies Dispense based on patient and insurance preference. Use up to four times daily as directed. (FOR ICD-10 E10.9, E11.9). 1 each 0   Budeson-Glycopyrrol-Formoterol (BREZTRI AEROSPHERE) 160-9-4.8 MCG/ACT AERO Inhale 2 puffs into the lungs 2 (two) times daily. 11.8 g 12   Cholecalciferol (VITAMIN D) 50 MCG (2000 UT) CAPS Take 1 capsule (2,000 Units total) by mouth daily. 90 capsule 4   clopidogrel (PLAVIX) 75 MG tablet TAKE ONE TABLET ($RemoveBef'75MG'UimCFOOHMt$  TOTAL) BY MOUTH DAILY WITH BREAKFAST 90 tablet 1   Continuous Blood Gluc Receiver (FREESTYLE LIBRE 14 DAY READER) DEVI See admin instructions. 4 each 0   Continuous Blood Gluc Receiver (FREESTYLE LIBRE 2 READER) DEVI Check blood glucose up to 4 times daily 1 each 0   Continuous Blood Gluc Sensor (FREESTYLE LIBRE 2 SENSOR)  MISC Apply one sensor to the upper arm avery 14 days. 6 each 1   cyanocobalamin (VITAMIN B12) 1000 MCG/ML injection INJECT 1 ML INTO THE MUSCLE EVERY 30 DAYS. 1 mL 0   cyclobenzaprine (FLEXERIL) 5 MG tablet Take 1 tablet (5 mg total) by mouth 3 (three) times daily as needed for muscle spasms. 45 tablet 3   diclofenac (VOLTAREN) 75 MG EC tablet Take 1 tablet (75 mg total) by mouth 2 (two) times daily. 30 tablet 0   dicyclomine (BENTYL) 10 MG capsule Take 10 mg by mouth as needed for spasms.     empagliflozin (JARDIANCE) 10 MG TABS tablet Take 1 tablet (10 mg total) by mouth daily before breakfast. 90 tablet 1   FLUoxetine (PROZAC) 40 MG capsule Take 1 capsule (40 mg total) by mouth daily. 90 capsule 3   gabapentin (NEURONTIN) 300 MG capsule TAKE ONE CAPSULE (300 MG TOTAL) BY MOUTHTWO TIMES DAILY. 180 capsule 1   glucose blood (ONETOUCH ULTRA) test strip Use as instructed 100 each 12   glucose monitoring kit (FREESTYLE) monitoring kit Use to check blood sugar BID as directed 1 each 0   lactase  (LACTAID) 3000 units tablet Take by mouth as needed.     Lancets (ONETOUCH DELICA PLUS [id number]) MISC 1 each by Does not apply route 2 (two) times a day. 100 each 3   Misc. Devices MISC Please provide supplies (needle, syringe, alcohol swabs) needed for patient to self-administer B-12 injections monthly. 1 each 12   pantoprazole (PROTONIX) 40 MG tablet TAKE ONE (1) TABLET 30 MINS BEFORE YOUR FIRST MEAL. 90 tablet 3   sacubitril-valsartan (ENTRESTO) 49-51 MG Take 1 tablet by mouth 2 (two) times daily. 60 tablet 6   spironolactone (ALDACTONE) 50 MG tablet Take 1 tablet (50 mg total) by mouth daily. 90 tablet 1   ULTICARE TUBERCULIN SAFETY SYR 25G X 1" 1 ML MISC USE TO ADMINISTER INJECTABLE VITAMIN B12. 1 each 0   No current facility-administered medications for this visit.   Allergies:  Bee venom, Codeine, Penicillins, Bupropion, and Sudafed [pseudoephedrine hcl]   ROS: No palpitations or syncope.  Physical Exam: VS:  BP (!) 152/78 (BP Location: Left Arm, Patient Position: Sitting, Cuff Size: Normal)   Pulse 67   Ht _0  (1.651 m)   Wt 134 lb 9.6 oz (61.1 kg)   SpO2 94%   BMI 22.40 kg/m , BMI Body mass index is 22.4 kg/m.  Wt Readings from Last 3 Encounters:  06/07/22 134 lb 9.6 oz (61.1 kg)  04/26/22 131 lb (59.4 kg)  02/09/22 131 lb 3.2 oz (59.5 kg)    General: Patient appears comfortable at rest. HEENT: Conjunctiva and lids normal. Neck: Supple, no elevated JVP or carotid bruits, no thyromegaly. Lungs: Clear to auscultation, nonlabored breathing at rest. Cardiac: Regular rate and rhythm, no S3 or significant systolic murmur, no pericardial rub. Extremities: No pitting edema.  ECG:  An ECG dated 06/01/2021 was personally reviewed today and demonstrated:  Sinus rhythm.  Recent Labwork: 08/12/2021: TSH 1.86 03/21/2022: Hemoglobin 14.1; Platelets 402 04/26/2022: ALT 14; AST 12; BUN 10; Creatinine, Ser 0.71; Potassium 3.4; Sodium 139     Component Value Date/Time   CHOL 165  08/12/2021 1446   CHOL 202 (H) 09/01/2020 1510   TRIG 349 (H) 08/12/2021 1446   HDL 36 (L) 08/12/2021 1446   HDL 31 (L) 09/01/2020 1510   CHOLHDL 4.6 08/12/2021 1446   VLDL 75.0 (H) 04/26/2021 1108   LDLCALC 87 08/12/2021 1446  LDLDIRECT 111.0 04/26/2021 1108    Other Studies Reviewed Today:  Echocardiogram 10/06/2020:  1. Left ventricular ejection fraction, by estimation, is 60 to 65%. The  left ventricle has normal function. The left ventricle has no regional  wall motion abnormalities. Left ventricular diastolic parameters are  indeterminate.   2. Right ventricular systolic function is normal. The right ventricular  size is normal.   3. Left atrial size was moderately dilated.   4. The mitral valve is normal in structure. No evidence of mitral valve  regurgitation. No evidence of mitral stenosis.   5. The aortic valve is tricuspid. There is mild calcification of the  aortic valve. There is mild thickening of the aortic valve. Aortic valve  regurgitation is not visualized. No aortic stenosis is present.   6. The inferior vena cava is normal in size with greater than 50%  respiratory variability, suggesting right atrial pressure of 3 mmHg.   Assessment and Plan:  1.  History of nonischemic cardiomyopathy, HFrecEF with LVEF 60 to 65% by echocardiogram in January 2022.  She reports NYHA class II dyspnea.  Plan to update echocardiogram.  Continue bisoprolol, Aldactone, Jardiance, and look into cost for switch from Cozaar to [place] (mainly aimed at better blood pressure control at this point).  I reviewed her recent lab work.  2.  Mixed hyperlipidemia, increase Lipitor to 80 mg daily, last LDL 87.  3.  Longstanding tobacco abuse.  Smoking cessation discussed over time.  4.  Essential hypertension, likely switch from Cozaar to [facility] starting at 49/51 mg twice daily.  Check BMET in 2 weeks.  5.  History of PSVT.  No obvious recurrences.  6.  PAD status post prior right  popliteal angioplasty and stent placement as well as right AT angioplasty by Dr. Stanford Breed.  She has had toe amputations on both feet.  Keep follow-up with [facility].  Medication Adjustments/Labs and Tests Ordered: Current medicines are reviewed at length with the patient today.  Concerns regarding medicines are outlined above.   Tests Ordered: Orders Placed This Encounter  Procedures   Basic metabolic panel   EKG 35-TIRW   ECHOCARDIOGRAM COMPLETE    Medication Changes: Meds ordered this encounter  Medications   atorvastatin (LIPITOR) 80 MG tablet    Sig: Take 1 tablet (80 mg total) by mouth daily.    Dispense:  90 tablet    Refill:  3    06/07/2022 dose increase   sacubitril-valsartan (ENTRESTO) 49-51 MG    Sig: Take 1 tablet by mouth 2 (two) times daily.    Dispense:  60 tablet    Refill:  6    06/07/2022 NEW-stop losartan    Disposition:  Follow up  6 months.  Signed, Satira Sark, MD, [facility] 06/07/2022 1:28 PM    [facility] [place], [place], [place] [address] Phone: [phone]; Fax: [phone]

## 2022-06-08 ENCOUNTER — Encounter (HOSPITAL_COMMUNITY): Payer: Self-pay | Admitting: Hematology

## 2022-06-09 ENCOUNTER — Other Ambulatory Visit: Payer: Self-pay | Admitting: Cardiology

## 2022-06-09 ENCOUNTER — Ambulatory Visit: Payer: PPO | Attending: Cardiology

## 2022-06-09 DIAGNOSIS — I429 Cardiomyopathy, unspecified: Secondary | ICD-10-CM

## 2022-06-09 DIAGNOSIS — Z8679 Personal history of other diseases of the circulatory system: Secondary | ICD-10-CM

## 2022-06-09 LAB — ECHOCARDIOGRAM COMPLETE
AR max vel: 1.47 cm2
AV Peak grad: 7.5 mmHg
Ao pk vel: 1.37 m/s
Area-P 1/2: 3.89 cm2
Calc EF: 55.3 %
MV M vel: 3.54 m/s
MV Peak grad: 50.1 mmHg
S' Lateral: 3.13 cm
Single Plane A2C EF: 54.1 %
Single Plane A4C EF: 59.2 %

## 2022-06-13 ENCOUNTER — Encounter: Payer: Self-pay | Admitting: Physician Assistant

## 2022-06-27 ENCOUNTER — Other Ambulatory Visit (HOSPITAL_COMMUNITY)
Admission: RE | Admit: 2022-06-27 | Discharge: 2022-06-27 | Disposition: A | Discharge status: Home / Self Care | Payer: PPO | Source: Ambulatory Visit | Attending: Cardiology | Admitting: Cardiology

## 2022-06-27 DIAGNOSIS — I10 Essential (primary) hypertension: Secondary | ICD-10-CM | POA: Insufficient documentation

## 2022-06-27 DIAGNOSIS — Z79899 Other long term (current) drug therapy: Secondary | ICD-10-CM | POA: Insufficient documentation

## 2022-06-27 DIAGNOSIS — Z8679 Personal history of other diseases of the circulatory system: Secondary | ICD-10-CM | POA: Diagnosis present

## 2022-06-27 LAB — BASIC METABOLIC PANEL
Anion gap: 10 (ref 5–15)
BUN: 11 mg/dL (ref 8–23)
CO2: 27 mmol/L (ref 22–32)
Calcium: 9.2 mg/dL (ref 8.9–10.3)
Chloride: 101 mmol/L (ref 98–111)
Creatinine, Ser: 0.62 mg/dL (ref 0.44–1.00)
GFR, Estimated: >60 mL/min (ref >60)
Glucose, Bld: 195 mg/dL — ABNORMAL HIGH (ref 70–99)
Potassium: 4 mmol/L (ref 3.5–5.1)
Sodium: 138 mmol/L (ref 135–145)

## 2022-07-06 ENCOUNTER — Other Ambulatory Visit (HOSPITAL_COMMUNITY): Payer: Self-pay | Admitting: Hematology

## 2022-07-28 ENCOUNTER — Inpatient Hospital Stay: Payer: PPO | Attending: Hematology

## 2022-07-28 DIAGNOSIS — D500 Iron deficiency anemia secondary to blood loss (chronic): Secondary | ICD-10-CM | POA: Insufficient documentation

## 2022-07-28 DIAGNOSIS — N920 Excessive and frequent menstruation with regular cycle: Secondary | ICD-10-CM | POA: Diagnosis not present

## 2022-07-28 DIAGNOSIS — Z79899 Other long term (current) drug therapy: Secondary | ICD-10-CM | POA: Insufficient documentation

## 2022-07-28 LAB — CBC WITH DIFFERENTIAL/PLATELET
Abs Immature Granulocytes: 0.19 10*3/uL — ABNORMAL HIGH (ref 0.00–0.07)
Basophils Absolute: 0.1 10*3/uL (ref 0.0–0.1)
Basophils Relative: 0 %
Eosinophils Absolute: 0.2 10*3/uL (ref 0.0–0.5)
Eosinophils Relative: 1 %
HCT: 37.7 % (ref 36.0–46.0)
Hemoglobin: 12.2 g/dL (ref 12.0–15.0)
Immature Granulocytes: 1 %
Lymphocytes Relative: 11 %
Lymphs Abs: 1.6 10*3/uL (ref 0.7–4.0)
MCH: 31.8 pg (ref 26.0–34.0)
MCHC: 32.4 g/dL (ref 30.0–36.0)
MCV: 98.2 fL (ref 80.0–100.0)
Monocytes Absolute: 1.6 10*3/uL — ABNORMAL HIGH (ref 0.1–1.0)
Monocytes Relative: 11 %
Neutro Abs: 11.5 10*3/uL — ABNORMAL HIGH (ref 1.7–7.7)
Neutrophils Relative %: 76 %
Platelets: 518 10*3/uL — ABNORMAL HIGH (ref 150–400)
RBC: 3.84 MIL/uL — ABNORMAL LOW (ref 3.87–5.11)
RDW: 14.1 % (ref 11.5–15.5)
WBC: 15.1 10*3/uL — ABNORMAL HIGH (ref 4.0–10.5)
nRBC: 0 % (ref 0.0–0.2)

## 2022-07-28 LAB — IRON AND TIBC
Iron: 34 ug/dL (ref 28–170)
Saturation Ratios: 9 % — ABNORMAL LOW (ref 10.4–31.8)
TIBC: 382 ug/dL (ref 250–450)
UIBC: 348 ug/dL

## 2022-07-28 LAB — FERRITIN: Ferritin: 24 ng/mL (ref 11–307)

## 2022-08-04 ENCOUNTER — Inpatient Hospital Stay: Payer: PPO | Attending: Physician Assistant | Admitting: Physician Assistant

## 2022-08-04 DIAGNOSIS — D72821 Monocytosis (symptomatic): Secondary | ICD-10-CM | POA: Insufficient documentation

## 2022-08-04 DIAGNOSIS — D500 Iron deficiency anemia secondary to blood loss (chronic): Secondary | ICD-10-CM

## 2022-08-04 DIAGNOSIS — R45851 Suicidal ideations: Secondary | ICD-10-CM | POA: Insufficient documentation

## 2022-08-04 DIAGNOSIS — E559 Vitamin D deficiency, unspecified: Secondary | ICD-10-CM | POA: Diagnosis not present

## 2022-08-04 DIAGNOSIS — E538 Deficiency of other specified B group vitamins: Secondary | ICD-10-CM | POA: Insufficient documentation

## 2022-08-04 DIAGNOSIS — Z79899 Other long term (current) drug therapy: Secondary | ICD-10-CM | POA: Insufficient documentation

## 2022-08-04 DIAGNOSIS — I2510 Atherosclerotic heart disease of native coronary artery without angina pectoris: Secondary | ICD-10-CM | POA: Insufficient documentation

## 2022-08-04 DIAGNOSIS — R0602 Shortness of breath: Secondary | ICD-10-CM | POA: Insufficient documentation

## 2022-08-04 DIAGNOSIS — R5383 Other fatigue: Secondary | ICD-10-CM | POA: Insufficient documentation

## 2022-08-04 DIAGNOSIS — Z7982 Long term (current) use of aspirin: Secondary | ICD-10-CM | POA: Insufficient documentation

## 2022-08-04 DIAGNOSIS — K644 Residual hemorrhoidal skin tags: Secondary | ICD-10-CM | POA: Insufficient documentation

## 2022-08-04 DIAGNOSIS — I739 Peripheral vascular disease, unspecified: Secondary | ICD-10-CM | POA: Insufficient documentation

## 2022-08-04 DIAGNOSIS — D72829 Elevated white blood cell count, unspecified: Secondary | ICD-10-CM | POA: Insufficient documentation

## 2022-08-04 DIAGNOSIS — K2960 Other gastritis without bleeding: Secondary | ICD-10-CM | POA: Insufficient documentation

## 2022-08-04 DIAGNOSIS — R0609 Other forms of dyspnea: Secondary | ICD-10-CM | POA: Insufficient documentation

## 2022-08-04 DIAGNOSIS — R059 Cough, unspecified: Secondary | ICD-10-CM | POA: Insufficient documentation

## 2022-08-04 DIAGNOSIS — J449 Chronic obstructive pulmonary disease, unspecified: Secondary | ICD-10-CM | POA: Insufficient documentation

## 2022-08-04 DIAGNOSIS — D75839 Thrombocytosis, unspecified: Secondary | ICD-10-CM | POA: Insufficient documentation

## 2022-08-04 DIAGNOSIS — D509 Iron deficiency anemia, unspecified: Secondary | ICD-10-CM | POA: Insufficient documentation

## 2022-08-04 DIAGNOSIS — R519 Headache, unspecified: Secondary | ICD-10-CM | POA: Insufficient documentation

## 2022-08-04 NOTE — Progress Notes (Signed)
Virtual Visit via Telephone Note [facility]  I connected with Tammy Mcpherson  on 08/04/22 at  1:34 PM by telephone and verified that I am speaking with the correct person using two identifiers.  Location: Patient: Home Provider: [facility]   I discussed the limitations, risks, security and privacy concerns of performing an evaluation and management service by telephone and the availability of in person appointments. I also discussed with the patient that there may be a patient responsible charge related to this service. The patient expressed understanding and agreed to proceed.  REASON FOR VISIT:  Follow-up for iron deficiency anemia   PRIOR THERAPY: None   CURRENT THERAPY: Intermittent Feraheme  INTERVAL HISTORY: Tammy Mcpherson 66 y.o. female returns for routine follow-up of her iron deficiency anemia.  She was last evaluated by Tarri Abernethy PA-C on via telemedicine visit on 03/28/2022.  Last IV iron was Feraheme x2 on 04/13/2022 and 04/21/2022.   At today's visit, she reports feeling fairly well, apart from a head cold.  She has significantly improved energy after her IV iron in July, but is starting to feel fatigued again.  She continues to take both Plavix and aspirin for her peripheral arterial disease.  She notices occasional scant rectal bleeding after a bowel movement, but denies any frank hematochezia, melena, epistaxis, or hematemesis.  She has occasional headaches, which is normal.  She has baseline dyspnea on exertion from underlying COPD.  She denies any pica, lightheadedness, syncope, or chest pain.  She denies any B symptoms such as fever, chills, night sweats, unintentional weight loss.  She continues to take her monthly vitamin B12 injection as well as daily vitamin D supplement.   Oral iron supplementation was restarted after her visit in February 2023, but she was unable to tolerate this due to stomach cramps.  She has 60% energy and 80%  appetite. She endorses that she is maintaining a stable weight.    OBSERVATIONS/OBJECTIVE: Review of Systems  Constitutional:  Positive for malaise/fatigue. Negative for chills, diaphoresis, fever and weight loss.  Respiratory:  Positive for cough and shortness of breath (COPD).   Cardiovascular:  Negative for chest pain and palpitations.  Gastrointestinal:  Negative for abdominal pain, blood in stool, melena, nausea and vomiting.  Neurological:  Negative for dizziness and headaches.  Psychiatric/Behavioral:  Positive for suicidal ideas. The patient is nervous/anxious.      PHYSICAL EXAM (per limitations of virtual telephone visit): The patient is alert and oriented x 3, exhibiting adequate mentation, good mood, and ability to speak in full sentences and execute sound judgement.   ASSESSMENT & PLAN: 1.  Iron deficiency anemia: -Thought to be from chronic GI blood loss from erosive gastritis and gastric/small bowel AVMs.  She has history of multiple blood transfusions. - EGD (07/07/2016 by Dr. Oneida Alar): Erosive gastritis, many nonbleeding gastric ulcers, and duodenal AVMs - Colonoscopy (07/07/2016 by Dr. Oneida Alar): External hemorrhoids, diverticulosis, no source of colonic bleeding - She is on both aspirin and Plavix due to her peripheral vascular and cardiovascular disease. - She was previously on an oral iron supplement, but this was ineffective at improving her iron levels.  She was restarted on oral iron supplement in February 2023, but was unable to tolerate it due to stomach cramps. - Last Feraheme 04/13/2022 and 04/21/2022 - Occasional scant rectal bleeding with bowel movements, but no frank hematochezia or melena   - She has worsening fatigue - Most recent labs (07/28/2022): Hgb 12.2/MCV 98.2, ferritin  24, iron saturation 9 % - PLAN: Recommend IV iron with Feraheme x2. - Repeat labs and RTC in 4 months    2.  Leukocytosis and thrombocytosis: - Prior myeloproliferative work-up for JAK2  V617F testing and BCR/ABL by FISH was negative. - Platelets are intermittently elevated.  This is reactive from iron deficiency. - Mild leukocytosis likely reactive related to steroid inhalers. - Persistent monocytosis since at least 2014, absolute monocytes ranging from 1.1-2.3 - Most recent labs (07/28/2022): WBC 15.1/ANC 11.5, monocytes 1.6, platelets 518 - No B symptoms    - PLAN: No further work-up or intervention needed at this time - If she develops any B symptoms or has worsening on repeat CBCs, would consider bone marrow biopsy    3.  B12 deficiency: - She is on monthly B12 injections, which her husband administers to her at home - Most recent labs (03/21/2022): Normal vitamin B12 at 511, normal methylmalonic acid - PLAN: Continue monthly B12 injections.  Recheck B12/methylmalonic acid in 6 to 12 months.      4.  Vitamin D deficiency - She is taking vitamin D 2000 units daily. - Most recent labs (03/21/2022): Normal vitamin D 47.33 - PLAN: Continue daily vitamin D supplement.  Recheck vitamin D in 6 to 12 months.   FOLLOW UP INSTRUCTIONS: Feraheme x2 Labs in 4 months (CBC/D, ferritin, iron/TIBC, B12, MMA, vitamin D) Office visit 1 week after labs    I discussed the assessment and treatment plan with the patient. The patient was provided an opportunity to ask questions and all were answered. The patient agreed with the plan and demonstrated an understanding of the instructions.   The patient was advised to call back or seek an in-person evaluation if the symptoms worsen or if the condition fails to improve as anticipated.  I provided 22 minutes of non-face-to-face time during this encounter.   Harriett Rush, PA-C 08/04/2022 2:19 PM

## 2022-08-08 ENCOUNTER — Other Ambulatory Visit (HOSPITAL_COMMUNITY): Payer: Self-pay | Admitting: Hematology

## 2022-08-10 ENCOUNTER — Inpatient Hospital Stay: Payer: PPO

## 2022-08-10 VITALS — BP 145/64 | HR 90 | Temp 99.2°F | Resp 18

## 2022-08-10 DIAGNOSIS — R519 Headache, unspecified: Secondary | ICD-10-CM | POA: Diagnosis not present

## 2022-08-10 DIAGNOSIS — D72821 Monocytosis (symptomatic): Secondary | ICD-10-CM | POA: Diagnosis not present

## 2022-08-10 DIAGNOSIS — R45851 Suicidal ideations: Secondary | ICD-10-CM | POA: Diagnosis not present

## 2022-08-10 DIAGNOSIS — K644 Residual hemorrhoidal skin tags: Secondary | ICD-10-CM | POA: Diagnosis not present

## 2022-08-10 DIAGNOSIS — D72829 Elevated white blood cell count, unspecified: Secondary | ICD-10-CM | POA: Diagnosis not present

## 2022-08-10 DIAGNOSIS — Z79899 Other long term (current) drug therapy: Secondary | ICD-10-CM | POA: Diagnosis not present

## 2022-08-10 DIAGNOSIS — D75839 Thrombocytosis, unspecified: Secondary | ICD-10-CM | POA: Diagnosis not present

## 2022-08-10 DIAGNOSIS — D509 Iron deficiency anemia, unspecified: Secondary | ICD-10-CM | POA: Diagnosis not present

## 2022-08-10 DIAGNOSIS — J449 Chronic obstructive pulmonary disease, unspecified: Secondary | ICD-10-CM | POA: Diagnosis not present

## 2022-08-10 DIAGNOSIS — R059 Cough, unspecified: Secondary | ICD-10-CM | POA: Diagnosis not present

## 2022-08-10 DIAGNOSIS — D500 Iron deficiency anemia secondary to blood loss (chronic): Secondary | ICD-10-CM | POA: Diagnosis present

## 2022-08-10 DIAGNOSIS — D508 Other iron deficiency anemias: Secondary | ICD-10-CM

## 2022-08-10 DIAGNOSIS — E538 Deficiency of other specified B group vitamins: Secondary | ICD-10-CM | POA: Diagnosis not present

## 2022-08-10 DIAGNOSIS — R5383 Other fatigue: Secondary | ICD-10-CM | POA: Diagnosis not present

## 2022-08-10 DIAGNOSIS — Z7982 Long term (current) use of aspirin: Secondary | ICD-10-CM | POA: Diagnosis not present

## 2022-08-10 DIAGNOSIS — I739 Peripheral vascular disease, unspecified: Secondary | ICD-10-CM | POA: Diagnosis not present

## 2022-08-10 DIAGNOSIS — K2960 Other gastritis without bleeding: Secondary | ICD-10-CM | POA: Diagnosis not present

## 2022-08-10 DIAGNOSIS — E559 Vitamin D deficiency, unspecified: Secondary | ICD-10-CM | POA: Diagnosis not present

## 2022-08-10 DIAGNOSIS — I2510 Atherosclerotic heart disease of native coronary artery without angina pectoris: Secondary | ICD-10-CM | POA: Diagnosis not present

## 2022-08-10 DIAGNOSIS — R0609 Other forms of dyspnea: Secondary | ICD-10-CM | POA: Diagnosis not present

## 2022-08-10 DIAGNOSIS — R0602 Shortness of breath: Secondary | ICD-10-CM | POA: Diagnosis not present

## 2022-08-10 MED ORDER — ACETAMINOPHEN 325 MG PO TABS
650.0000 mg | ORAL_TABLET | Freq: Once | ORAL | Status: AC
  Administered 2022-08-10: 650 mg via ORAL
  Filled 2022-08-10: qty 2

## 2022-08-10 MED ORDER — SODIUM CHLORIDE 0.9 % IV SOLN
510.0000 mg | Freq: Once | INTRAVENOUS | Status: AC
  Administered 2022-08-10: 510 mg via INTRAVENOUS
  Filled 2022-08-10: qty 510

## 2022-08-10 MED ORDER — SODIUM CHLORIDE 0.9 % IV SOLN: Freq: Once | INTRAVENOUS | Status: AC

## 2022-08-10 MED ORDER — LORATADINE 10 MG PO TABS
10.0000 mg | ORAL_TABLET | Freq: Once | ORAL | Status: AC
  Administered 2022-08-10: 10 mg via ORAL
  Filled 2022-08-10: qty 1

## 2022-08-10 NOTE — Patient Instructions (Signed)
[facility]  Discharge Instructions: Thank you for choosing [facility] to provide your oncology and hematology care.  If you have a lab appointment with the Cancer Center, please come in thru the Main Entrance and check in at the main information desk.  Wear comfortable clothing and clothing appropriate for easy access to any Portacath or PICC line.   We strive to give you quality time with your provider. You may need to reschedule your appointment if you arrive late (15 or more minutes).  Arriving late affects you and other patients whose appointments are after yours.  Also, if you miss three or more appointments without notifying the office, you may be dismissed from the clinic at the provider's discretion.      For prescription refill requests, have your pharmacy contact our office and allow 72 hours for refills to be completed.    Today you received the following chemotherapy and/or immunotherapy agents Feraheme.  Ferumoxytol Injection What is this medication? FERUMOXYTOL (FER ue MOX i tol) treats low levels of iron in your body (iron deficiency anemia). Iron is a mineral that plays an important role in making red blood cells, which carry oxygen from your lungs to the rest of your body. This medicine may be used for other purposes; ask your health care provider or pharmacist if you have questions. COMMON BRAND NAME(S): Feraheme What should I tell my care team before I take this medication? They need to know if you have any of these conditions: Anemia not caused by low iron levels High levels of iron in the blood Magnetic resonance imaging (MRI) test scheduled An unusual or allergic reaction to iron, other medications, foods, dyes, or preservatives Pregnant or trying to get pregnant Breastfeeding How should I use this medication? This medication is injected into a vein. It is given by your care team in a hospital or clinic setting. Talk to your care  team the use of this medication in children. Special care may be needed. Overdosage: If you think you have taken too much of this medicine contact a poison control center or emergency room at once. NOTE: This medicine is only for you. Do not share this medicine with others. What if I miss a dose? It is important not to miss your dose. Call your care team if you are unable to keep an appointment. What may interact with this medication? Other iron products This list may not describe all possible interactions. Give your health care provider a list of all the medicines, herbs, non-prescription drugs, or dietary supplements you use. Also tell them if you smoke, drink alcohol, or use illegal drugs. Some items may interact with your medicine. What should I watch for while using this medication? Visit your care team regularly. Tell your care team if your symptoms do not start to get better or if they get worse. You may need blood work done while you are taking this medication. You may need to follow a special diet. Talk to your care team. Foods that contain iron include: whole grains/cereals, dried fruits, beans, or peas, leafy green vegetables, and organ meats (liver, kidney). What side effects may I notice from receiving this medication? Side effects that you should report to your care team as soon as possible: Allergic reactions--skin rash, itching, hives, swelling of the face, lips, tongue, or throat Low blood pressure--dizziness, feeling faint or lightheaded, blurry vision Shortness of breath Side effects that usually do not require medical attention (report to   your care team if they continue or are bothersome): Flushing Headache Joint pain Muscle pain Nausea Pain, redness, or irritation at injection site This list may not describe all possible side effects. Call your doctor for medical advice about side effects. You may report side effects to FDA at 1-800-FDA-1088. Where should I keep my  medication? This medication is given in a hospital or clinic and will not be stored at home. NOTE: This sheet is a summary. It may not cover all possible information. If you have questions about this medicine, talk to your doctor, pharmacist, or health care provider.  2023 Elsevier/Gold Standard (2021-02-09 00:00:00)       To help prevent nausea and vomiting after your treatment, we encourage you to take your nausea medication as directed.  BELOW ARE SYMPTOMS THAT SHOULD BE REPORTED IMMEDIATELY: *FEVER GREATER THAN 100.4 F (38 C) OR HIGHER *CHILLS OR SWEATING *NAUSEA AND VOMITING THAT IS NOT CONTROLLED WITH YOUR NAUSEA MEDICATION *UNUSUAL SHORTNESS OF BREATH *UNUSUAL BRUISING OR BLEEDING *URINARY PROBLEMS (pain or burning when urinating, or frequent urination) *BOWEL PROBLEMS (unusual diarrhea, constipation, pain near the anus) TENDERNESS IN MOUTH AND THROAT WITH OR WITHOUT PRESENCE OF ULCERS (sore throat, sores in mouth, or a toothache) UNUSUAL RASH, SWELLING OR PAIN  UNUSUAL VAGINAL DISCHARGE OR ITCHING   Items with * indicate a potential emergency and should be followed up as soon as possible or go to the Emergency Department if any problems should occur.  Please show the CHEMOTHERAPY ALERT CARD or IMMUNOTHERAPY ALERT CARD at check-in to the Emergency Department and triage nurse.  Should you have questions after your visit or need to cancel or reschedule your appointment, please contact [facility] AT  [phone]  and follow the prompts.  Office hours are 8:00 a.m. to 4:30 p.m. Monday - Friday. Please note that voicemails left after 4:00 p.m. may not be returned until the following business day.  We are closed weekends and major holidays. You have access to a nurse at all times for urgent questions. Please call the main number to the clinic [phone] and follow the prompts.  For any non-urgent questions, you may also contact your provider using MyChart. We now  offer e-Visits for anyone 18 and older to request care online for non-urgent symptoms. For details visit [email or web address].   Also download the MyChart app! Go to the app store, search "MyChart", open the app, select [facility], and log in with your MyChart username and password.  Masks are optional in the cancer centers. If you would like for your care team to wear a mask while they are taking care of you, please let them know. You may have one support person who is at least 65 years old accompany you for your appointments.  

## 2022-08-10 NOTE — Progress Notes (Signed)
Patient presents today for Feraheme infusion. Patient has no complaints of any side effects related to iron infusions. Patient has no complaints today.   Feraheme given today per MD orders. Tolerated infusion without adverse affects. Vital signs stable. No complaints at this time. Discharged from clinic ambulatory in stable condition. Alert and oriented x 3. F/U with [facility] as scheduled.

## 2022-08-15 ENCOUNTER — Ambulatory Visit: Payer: PPO | Admitting: Registered Nurse

## 2022-08-17 ENCOUNTER — Inpatient Hospital Stay: Payer: PPO

## 2022-08-17 VITALS — BP 146/60 | HR 83 | Temp 98.0°F | Resp 18

## 2022-08-17 DIAGNOSIS — D508 Other iron deficiency anemias: Secondary | ICD-10-CM

## 2022-08-17 DIAGNOSIS — D509 Iron deficiency anemia, unspecified: Secondary | ICD-10-CM | POA: Diagnosis not present

## 2022-08-17 MED ORDER — ACETAMINOPHEN 325 MG PO TABS
650.0000 mg | ORAL_TABLET | Freq: Once | ORAL | Status: AC
  Administered 2022-08-17: 650 mg via ORAL
  Filled 2022-08-17: qty 2

## 2022-08-17 MED ORDER — SODIUM CHLORIDE 0.9 % IV SOLN: Freq: Once | INTRAVENOUS | Status: AC

## 2022-08-17 MED ORDER — LORATADINE 10 MG PO TABS
10.0000 mg | ORAL_TABLET | Freq: Once | ORAL | Status: AC
  Administered 2022-08-17: 10 mg via ORAL
  Filled 2022-08-17: qty 1

## 2022-08-17 MED ORDER — SODIUM CHLORIDE 0.9 % IV SOLN
510.0000 mg | Freq: Once | INTRAVENOUS | Status: AC
  Administered 2022-08-17: 510 mg via INTRAVENOUS
  Filled 2022-08-17: qty 510

## 2022-08-17 NOTE — Progress Notes (Signed)
Patient tolerated iron infusion with no complaints voiced. Peripheral IV site clean and dry with good blood return noted before and after infusion. Patient refused to wait 30 minute wait time. Band aid applied. VSS with discharge and left in satisfactory condition with no s/s of distress noted.

## 2022-08-17 NOTE — Patient Instructions (Signed)
[facility]  Discharge Instructions: Thank you for choosing [facility] to provide your oncology and hematology care.  If you have a lab appointment with the Cancer Center, please come in thru the Main Entrance and check in at the main information desk.  Wear comfortable clothing and clothing appropriate for easy access to any Portacath or PICC line.   We strive to give you quality time with your provider. You may need to reschedule your appointment if you arrive late (15 or more minutes).  Arriving late affects you and other patients whose appointments are after yours.  Also, if you miss three or more appointments without notifying the office, you may be dismissed from the clinic at the provider's discretion.      For prescription refill requests, have your pharmacy contact our office and allow 72 hours for refills to be completed.    Today you received the following Feraheme, return as scheduled.   To help prevent nausea and vomiting after your treatment, we encourage you to take your nausea medication as directed.  BELOW ARE SYMPTOMS THAT SHOULD BE REPORTED IMMEDIATELY: *FEVER GREATER THAN 100.4 F (38 C) OR HIGHER *CHILLS OR SWEATING *NAUSEA AND VOMITING THAT IS NOT CONTROLLED WITH YOUR NAUSEA MEDICATION *UNUSUAL SHORTNESS OF BREATH *UNUSUAL BRUISING OR BLEEDING *URINARY PROBLEMS (pain or burning when urinating, or frequent urination) *BOWEL PROBLEMS (unusual diarrhea, constipation, pain near the anus) TENDERNESS IN MOUTH AND THROAT WITH OR WITHOUT PRESENCE OF ULCERS (sore throat, sores in mouth, or a toothache) UNUSUAL RASH, SWELLING OR PAIN  UNUSUAL VAGINAL DISCHARGE OR ITCHING   Items with * indicate a potential emergency and should be followed up as soon as possible or go to the Emergency Department if any problems should occur.  Please show the CHEMOTHERAPY ALERT CARD or IMMUNOTHERAPY ALERT CARD at check-in to the Emergency Department and  triage nurse.  Should you have questions after your visit or need to cancel or reschedule your appointment, please contact [facility] [phone]  and follow the prompts.  Office hours are 8:00 a.m. to 4:30 p.m. Monday - Friday. Please note that voicemails left after 4:00 p.m. may not be returned until the following business day.  We are closed weekends and major holidays. You have access to a nurse at all times for urgent questions. Please call the main number to the clinic [phone] and follow the prompts.  For any non-urgent questions, you may also contact your provider using MyChart. We now offer e-Visits for anyone 18 and older to request care online for non-urgent symptoms. For details visit [email or web address].   Also download the MyChart app! Go to the app store, search "MyChart", open the app, select [facility], and log in with your MyChart username and password.  Masks are optional in the cancer centers. If you would like for your care team to wear a mask while they are taking care of you, please let them know. You may have one support person who is at least 65 years old accompany you for your appointments.  

## 2022-10-10 ENCOUNTER — Encounter: Payer: Self-pay | Admitting: Physician Assistant

## 2022-10-10 DIAGNOSIS — E1165 Type 2 diabetes mellitus with hyperglycemia: Secondary | ICD-10-CM

## 2022-10-11 MED ORDER — FREESTYLE LIBRE 2 READER DEVI: 0 refills | Status: DC

## 2022-10-25 ENCOUNTER — Ambulatory Visit (INDEPENDENT_AMBULATORY_CARE_PROVIDER_SITE_OTHER): Payer: PPO | Admitting: Physician Assistant

## 2022-10-25 ENCOUNTER — Encounter: Payer: Self-pay | Admitting: Physician Assistant

## 2022-10-25 VITALS — BP 120/50 | HR 113 | Temp 97.7°F | Ht 61.5 in | Wt 136.5 lb

## 2022-10-25 DIAGNOSIS — Z720 Tobacco use: Secondary | ICD-10-CM

## 2022-10-25 DIAGNOSIS — M79604 Pain in right leg: Secondary | ICD-10-CM

## 2022-10-25 DIAGNOSIS — J449 Chronic obstructive pulmonary disease, unspecified: Secondary | ICD-10-CM

## 2022-10-25 DIAGNOSIS — M79605 Pain in left leg: Secondary | ICD-10-CM

## 2022-10-25 DIAGNOSIS — E785 Hyperlipidemia, unspecified: Secondary | ICD-10-CM

## 2022-10-25 DIAGNOSIS — Z23 Encounter for immunization: Secondary | ICD-10-CM

## 2022-10-25 DIAGNOSIS — I739 Peripheral vascular disease, unspecified: Secondary | ICD-10-CM

## 2022-10-25 DIAGNOSIS — E1165 Type 2 diabetes mellitus with hyperglycemia: Secondary | ICD-10-CM | POA: Diagnosis not present

## 2022-10-25 MED ORDER — METFORMIN HCL ER 500 MG PO TB24: 500.0000 mg | ORAL_TABLET | Freq: Every day | ORAL | 1 refills | Status: DC

## 2022-10-25 MED ORDER — TRAMADOL HCL 50 MG PO TABS: 50.0000 mg | ORAL_TABLET | Freq: Two times a day (BID) | ORAL | 0 refills | Status: DC | PRN

## 2022-10-25 NOTE — Patient Instructions (Signed)
It was great to see you!  Start metformin 500 mg XR daily  Increase gabapentin 300 mg to three times for your pain  May use tramadol for severe pain  Follow-up with vascular as scheduled -- THIS IS VERY IMPORTANT  Let's follow-up in 3 months for a physical exam, sooner if you have concerns.  Take care,  Inda Coke PA-C

## 2022-10-25 NOTE — Progress Notes (Signed)
Tammy Mcpherson is a 67 y.o. female here for a new patient visit.  History of Present Illness:   Chief Complaint  Patient presents with   Transfer of care   Leg Pain    Pt c/o bilateral pain in lower legs for couple of weeks. Has been using Tylenol with no relief.    Lower Leg Pain She reports pain in bilateral legs below the knee for the past x2 weeks. Her pain is especially severe in her left foot and right ankle areas. Denies any leg injuries, leg swelling. She takes Tylenol for this pain without any relief. She reports taking Tramadol after her amputation surgeries without any negative side effects. She reports that it is painful to walk. She is taking 300 mg gabapentin BID which gives her some relief from this pain but she feels that she needs a higher dose.  Chronic Issues: Diabetes She is no longer taking her Jardiance 10 mg daily. She stopped using Jardiance because her blood sugar dropped significantly when taking it. She only took once or twice. She reports using Metformin in the past. She has a glucometer at home and a [place]us monitor that she uses daily. Reports blood glucose of 130 last night. Lab Results  Component Value Date   CHOL 165 08/12/2021   HDL 36 (L) 08/12/2021   LDLCALC 87 08/12/2021   LDLDIRECT 111.0 04/26/2021   TRIG 349 (H) 08/12/2021   CHOLHDL 4.6 08/12/2021   COPD She is compliant with Albuterol and Breztri inhalers. She is followed by her pulmonology NP, [name].  HLD She is compliant with Lipitor 80 mg daily. This was recently increased from Lipitor 40 mg last fall.  Tobacco abuse Continues to smoke Not interested in quitting   Past Medical History:  Diagnosis Date   Anemia 04/30/2016   Anxiety    Arthritis    Cardiomyopathy ([place])    a. EF 40-45% by echo in 04/2016 b. Improved to 60-65% by repeat imaging in 2018   COPD (chronic obstructive pulmonary disease) (HCC)    Coronary artery calcification seen on CT scan    Essential  hypertension    GERD (gastroesophageal reflux disease)    Headache    History of bronchitis    History of GI bleed    History of hiatal hernia    Hyperlipidemia    Iron deficiency anemia    Peripheral vascular disease (HCC)    Pollen allergy    PSVT (paroxysmal supraventricular tachycardia)    PVC's (premature ventricular contractions)    Type 2 diabetes mellitus (HCC)    Vitamin B12 deficiency      Social History   Tobacco Use   Smoking status: Light Smoker    Packs/day: 0.50    Years: 44.00    Total pack years: 22.00    Types: Cigarettes    Start date: 05/10/1975    Passive exposure: Never   Smokeless tobacco: Never   Tobacco comments:    2 cigarettes per day 12/10/2019  Vaping Use   Vaping Use: Former  Substance Use Topics   Alcohol use: Not Currently    Alcohol/week: 2.0 standard drinks of alcohol    Types: 2 Glasses of wine per week   Drug use: No    Past Surgical History:  Procedure Laterality Date   ABDOMINAL AORTOGRAM W/LOWER EXTREMITY Bilateral 05/19/2021   Procedure: ABDOMINAL AORTOGRAM W/LOWER EXTREMITY;  Surgeon: [name], MD;  Location: [facility];  Service: Cardiovascular;  Laterality: Bilateral;  ABDOMINAL HYSTERECTOMY     polyps  about age 89   AIKEN OSTEOTOMY Right 07/10/2013   Procedure: Barbie Banner OSTEOTOMY RIGHT FOOT;  Surgeon: Marcheta Grammes, DPM;  Location: [facility];  Service: Orthopedics;  Laterality: Right;   AMPUTATION Left 05/09/2017   Procedure: AMPUTATION 5TH TOE LEFT FOOT;  Surgeon: [name], DPM;  Location: [facility];  Service: Podiatry;  Laterality: Left;   AMPUTATION Left 06/13/2017   Procedure: AMPUTATION 2ND TOE LEFT FOOT;  Surgeon: [name], DPM;  Location: [facility];  Service: Podiatry;  Laterality: Left;   AMPUTATION Right 06/01/2021   Procedure: Right great toe and Second Toe Amputation;  Surgeon: [name], MD;  Location: [facility];  Service: Vascular;  Laterality: Right;   BUNIONECTOMY Right  07/10/2013   Procedure: Prudy Feeler BUNIONECTOMY RIGHT FOOT;  Surgeon: Marcheta Grammes, DPM;  Location: [facility];  Service: Orthopedics;  Laterality: Right;   CHOLECYSTECTOMY N/A 08/22/2017   Procedure: LAPAROSCOPIC CHOLECYSTECTOMY;  Surgeon: Virl Cagey, MD;  Location: [facility];  Service: General;  Laterality: N/A;   COLONOSCOPY N/A 07/07/2016   Dr. Oneida Alar; redundant left colon, diverticulosis at hepatic flexure, non-bleeding internal hemorrhoids   ESOPHAGOGASTRODUODENOSCOPY N/A 07/07/2016   Dr. Oneida Alar: many non-bleeding cratered gastric ulcers without stigmata of bleeding in gastric antrum. four 2-3 mm angioectasias without bleeding in duodenal bulb and second portion of duodenum s/p APC. Chroni gastritis on path.    ESOPHAGOGASTRODUODENOSCOPY N/A 08/03/2017   Dr. Oneida Alar: erosive gastritis, AVMs. Found a single non-bleeding angioectasia in stomach, s/p APC therapy. Four non-bleeding angioectasias in duodenum s/p APC. Non-bleeding erosive gastropathy   IR ANGIOGRAM EXTREMITY LEFT  04/24/2017   IR FEM POP ART ATHERECT INC PTA MOD SED  04/24/2017   IR INFUSION THROMBOL ARTERIAL INITIAL (MS)  04/24/2017   IR RADIOLOGIST EVAL & MGMT  12/05/2016   IR US GUIDE VASC ACCESS RIGHT  04/24/2017   LIVER BIOPSY N/A 08/22/2017   Procedure: LIVER BIOPSY;  Surgeon: Virl Cagey, MD;  Location: [facility];  Service: General;  Laterality: N/A;   METATARSAL HEAD EXCISION Right 07/10/2013   Procedure: METATARSAL HEAD RESECTION OF DIGITS 2 AND 3 RIGHT FOOT;  Surgeon: Marcheta Grammes, DPM;  Location: [facility];  Service: Orthopedics;  Laterality: Right;   PERIPHERAL VASCULAR INTERVENTION Right 05/19/2021   Procedure: PERIPHERAL VASCULAR INTERVENTION;  Surgeon: [name], MD;  Location: [facility];  Service: Cardiovascular;  Laterality: Right;   PROXIMAL INTERPHALANGEAL FUSION (PIP) Right 07/10/2013   Procedure: ARTHRODESIS PIPJ  2ND DIGIT RIGHT FOOT;  Surgeon: Marcheta Grammes, DPM;  Location: [facility];  Service: Orthopedics;  Laterality: Right;    Family History  Adopted: Yes  Problem Relation Age of Onset   Hypertension Father    Coronary artery disease Sister    Ulcers Maternal Grandmother    Colon cancer Neg Hx        unknown, was adopted    Allergies  Allergen Reactions   Bee Venom Swelling   Codeine Anaphylaxis    Tongue swelled   Penicillins Swelling and Rash    Has patient had a PCN reaction causing immediate rash, facial/tongue/throat swelling, SOB or lightheadedness with hypotension: No, delayed Has patient had a PCN reaction causing severe rash involving mucus membranes or skin necrosis: No Has patient had a PCN reaction that required hospitalization No Has patient had a PCN reaction occurring within the last 10 years: No If all of the above answers are "NO", then may proceed  with Cephalosporin use.    Bupropion Other (See Comments)    "Made my skin crawl"   Sudafed [Pseudoephedrine Hcl] Rash    Current Medications:   Current Outpatient Medications:    acetaminophen (TYLENOL) 500 MG tablet, Take 500 mg by mouth at bedtime as needed for moderate pain., Disp: , Rfl:    albuterol (PROVENTIL) (2.5 MG/3ML) 0.083% nebulizer solution, Take 3 mLs (2.5 mg total) by nebulization every 4 (four) hours as needed for wheezing or shortness of breath., Disp: 75 mL, Rfl: 5   albuterol (VENTOLIN HFA) 108 (90 Base) MCG/ACT inhaler, INHALE TWO PUFFS INTO THE LUNGS EVERY SIX HOURS AS NEEDED FOR WHEEZING OR SHORTNESS OF BREATH, Disp: 8.5 g, Rfl: 5   ALPRAZolam (XANAX) 0.5 MG tablet, TAKE ONE TABLET (0.'5MG'$  TOTAL) BY MOUTH DAILY AS NEEDED FOR ANXIETY, Disp: 30 tablet, Rfl: 2   aspirin 81 MG chewable tablet, Chew 81 mg by mouth every morning. , Disp: , Rfl:    atorvastatin (LIPITOR) 80 MG tablet, Take 1 tablet (80 mg total) by mouth daily., Disp: 90 tablet, Rfl: 3   bisoprolol (ZEBETA) 10 MG tablet, TAKE ONE (1) TABLET BY MOUTH EVERY DAY, Disp: 90 tablet, Rfl: 1   blood glucose meter  kit and supplies, Dispense based on patient and insurance preference. Use up to four times daily as directed. (FOR ICD-10 E10.9, E11.9)., Disp: 1 each, Rfl: 0   Budeson-Glycopyrrol-Formoterol (BREZTRI AEROSPHERE) 160-9-4.8 MCG/ACT AERO, Inhale 2 puffs into the lungs 2 (two) times daily., Disp: 11.8 g, Rfl: 12   Cholecalciferol (VITAMIN D) 50 MCG (2000 UT) CAPS, Take 1 capsule (2,000 Units total) by mouth daily., Disp: 90 capsule, Rfl: 4   clopidogrel (PLAVIX) 75 MG tablet, TAKE ONE TABLET ('75MG'$  TOTAL) BY MOUTH DAILY WITH BREAKFAST, Disp: 90 tablet, Rfl: 1   Continuous Blood Gluc Receiver (FREESTYLE LIBRE 2 READER) DEVI, Check blood glucose up to 4 times daily, Disp: 1 each, Rfl: 0   Continuous Blood Gluc Sensor (FREESTYLE LIBRE 2 SENSOR) MISC, Apply one sensor to the upper arm avery 14 days., Disp: 6 each, Rfl: 1   cyanocobalamin (VITAMIN B12) 1000 MCG/ML injection, INJECT 1 ML INTO THE MUSCLE EVERY 30 DAYS., Disp: 1 mL, Rfl: 11   cyclobenzaprine (FLEXERIL) 5 MG tablet, Take 1 tablet (5 mg total) by mouth 3 (three) times daily as needed for muscle spasms., Disp: 45 tablet, Rfl: 3   dicyclomine (BENTYL) 10 MG capsule, Take 10 mg by mouth as needed for spasms., Disp: , Rfl:    FLUoxetine (PROZAC) 40 MG capsule, Take 1 capsule (40 mg total) by mouth daily., Disp: 90 capsule, Rfl: 3   gabapentin (NEURONTIN) 300 MG capsule, TAKE ONE CAPSULE (300 MG TOTAL) BY MOUTHTWO TIMES DAILY., Disp: 180 capsule, Rfl: 1   glucose blood (ONETOUCH ULTRA) test strip, Use as instructed, Disp: 100 each, Rfl: 12   glucose monitoring kit (FREESTYLE) monitoring kit, Use to check blood sugar BID as directed, Disp: 1 each, Rfl: 0   lactase (LACTAID) 3000 units tablet, Take by mouth as needed., Disp: , Rfl:    Lancets (ONETOUCH DELICA PLUS [id number]) MISC, 1 each by Does not apply route 2 (two) times a day., Disp: 100 each, Rfl: 3   metFORMIN (GLUCOPHAGE-XR) 500 MG 24 hr tablet, Take 1 tablet (500 mg total) by mouth daily with  breakfast., Disp: 90 tablet, Rfl: 1   Misc. Devices MISC, Please provide supplies (needle, syringe, alcohol swabs) needed for patient to self-administer B-12 injections monthly., Disp: 1 each,  Rfl: 12   pantoprazole (PROTONIX) 40 MG tablet, TAKE ONE (1) TABLET 30 MINS BEFORE YOUR FIRST MEAL., Disp: 90 tablet, Rfl: 3   sacubitril-valsartan (ENTRESTO) 49-51 MG, Take 1 tablet by mouth 2 (two) times daily., Disp: 60 tablet, Rfl: 6   spironolactone (ALDACTONE) 50 MG tablet, Take 1 tablet (50 mg total) by mouth daily., Disp: 90 tablet, Rfl: 1   traMADol (ULTRAM) 50 MG tablet, Take 1 tablet (50 mg total) by mouth every 12 (twelve) hours as needed for severe pain., Disp: 30 tablet, Rfl: 0   Tuberculin-Allergy Syringes (ULTICARE TUBERCULIN SAFETY SYR) 25G X 1" 1 ML MISC, USE TO ADMINISTER INJECTABLE VITAMIN B12., Disp: 1 each, Rfl: 11   Review of Systems:   Review of Systems  Musculoskeletal:        (+) Bilateral lower leg pain    Vitals:   Vitals:   10/25/22 1311  BP: (!) 120/50  Pulse: (!) 113  Temp: 97.7 F (36.5 C)  TempSrc: Temporal  SpO2: 99%  Weight: 136 lb 8 oz (61.9 kg)  Height: 5' 1.5" (1.562 m)     Body mass index is 25.37 kg/m.  Physical Exam:   Physical Exam Vitals and nursing note reviewed.  Constitutional:      General: She is not in acute distress.    Appearance: She is well-developed. She is not ill-appearing or toxic-appearing.  Cardiovascular:     Rate and Rhythm: Regular rhythm. Tachycardia present.     Pulses:          Dorsalis pedis pulses are 1+ on the right side and 1+ on the left side.       Posterior tibial pulses are 1+ on the right side and 1+ on the left side.     Heart sounds: Normal heart sounds, S1 normal and S2 normal.  Pulmonary:     Effort: Pulmonary effort is normal.     Breath sounds: Normal breath sounds.  Feet:     Comments: Sole of left foot with significant TTP Skin:    General: Skin is warm and dry.  Neurological:     Mental Status:  She is alert.     GCS: GCS eye subscore is 4. GCS verbal subscore is 5. GCS motor subscore is 6.  Psychiatric:        Speech: Speech normal.        Behavior: Behavior normal. Behavior is cooperative.     Assessment and Plan:   PAD (peripheral artery disease) ([place]) She is overdue for follow-up with her vascular specialists We called to schedule a follow-up appointment and scheduled this for next month She is not interested in smoking cessation If new/worsening sx -- needs to call vascular or go to ER  Bilateral leg pain Suspect multifactorial -- due to PAD, neuropathy Will trial increase in gabapentin to 300 mg TID Also will add low dose of tramadol 50 mg BID prn for severe pain Close follow-up with vascular Follow-up if any new/worsening sx   Type 2 diabetes mellitus with hyperglycemia, without long-term current use of insulin (HCC) Update A1c Suspect uncontrolled due to non-compliance Restart metformin 500 mg XR daily Follow-u pin 3 months  Chronic obstructive pulmonary disease, unspecified COPD type ([place]) Well controlled per patient  Mgmt per pulm  Hyperlipidemia, unspecified hyperlipidemia type Update lipid panel Mgmt per cardiology  Tobacco abuse Denies any interest in cessation  Need for prophylactic vaccination against Streptococcus pneumoniae (pneumococcus) Updated today   I,Alexander Ruley,acting as a scribe for  Inda Coke, PA.,have documented all relevant documentation on the behalf of Inda Coke, PA,as directed by  Inda Coke, PA while in the presence of Inda Coke, [place].  I, Inda Coke, [place], have reviewed all documentation for this visit. The documentation on 10/25/22 for the exam, diagnosis, procedures, and orders are all accurate and complete.  Inda Coke, PA-C

## 2022-10-26 ENCOUNTER — Telehealth: Payer: Self-pay | Admitting: *Deleted

## 2022-10-26 ENCOUNTER — Other Ambulatory Visit: Payer: Self-pay

## 2022-10-26 ENCOUNTER — Emergency Department (HOSPITAL_COMMUNITY): Payer: PPO

## 2022-10-26 ENCOUNTER — Inpatient Hospital Stay (HOSPITAL_COMMUNITY)
Admission: EM | Admit: 2022-10-26 | Discharge: 2022-10-29 | DRG: 377 | Disposition: A | Discharge status: Home / Self Care | Payer: PPO | Attending: Internal Medicine | Admitting: Internal Medicine

## 2022-10-26 ENCOUNTER — Encounter (HOSPITAL_COMMUNITY): Payer: Self-pay | Admitting: *Deleted

## 2022-10-26 DIAGNOSIS — K589 Irritable bowel syndrome without diarrhea: Secondary | ICD-10-CM | POA: Diagnosis present

## 2022-10-26 DIAGNOSIS — E1140 Type 2 diabetes mellitus with diabetic neuropathy, unspecified: Secondary | ICD-10-CM | POA: Diagnosis present

## 2022-10-26 DIAGNOSIS — J440 Chronic obstructive pulmonary disease with acute lower respiratory infection: Secondary | ICD-10-CM | POA: Diagnosis not present

## 2022-10-26 DIAGNOSIS — Z90710 Acquired absence of both cervix and uterus: Secondary | ICD-10-CM

## 2022-10-26 DIAGNOSIS — Z79899 Other long term (current) drug therapy: Secondary | ICD-10-CM

## 2022-10-26 DIAGNOSIS — D72829 Elevated white blood cell count, unspecified: Secondary | ICD-10-CM

## 2022-10-26 DIAGNOSIS — F411 Generalized anxiety disorder: Secondary | ICD-10-CM | POA: Diagnosis present

## 2022-10-26 DIAGNOSIS — K219 Gastro-esophageal reflux disease without esophagitis: Secondary | ICD-10-CM | POA: Diagnosis present

## 2022-10-26 DIAGNOSIS — D649 Anemia, unspecified: Principal | ICD-10-CM

## 2022-10-26 DIAGNOSIS — D62 Acute posthemorrhagic anemia: Secondary | ICD-10-CM | POA: Diagnosis present

## 2022-10-26 DIAGNOSIS — E119 Type 2 diabetes mellitus without complications: Secondary | ICD-10-CM

## 2022-10-26 DIAGNOSIS — I428 Other cardiomyopathies: Secondary | ICD-10-CM | POA: Diagnosis present

## 2022-10-26 DIAGNOSIS — D509 Iron deficiency anemia, unspecified: Secondary | ICD-10-CM | POA: Diagnosis present

## 2022-10-26 DIAGNOSIS — K922 Gastrointestinal hemorrhage, unspecified: Secondary | ICD-10-CM | POA: Diagnosis not present

## 2022-10-26 DIAGNOSIS — Z716 Tobacco abuse counseling: Secondary | ICD-10-CM

## 2022-10-26 DIAGNOSIS — Z885 Allergy status to narcotic agent status: Secondary | ICD-10-CM

## 2022-10-26 DIAGNOSIS — Z880 Allergy status to penicillin: Secondary | ICD-10-CM

## 2022-10-26 DIAGNOSIS — F17210 Nicotine dependence, cigarettes, uncomplicated: Secondary | ICD-10-CM | POA: Diagnosis present

## 2022-10-26 DIAGNOSIS — E876 Hypokalemia: Secondary | ICD-10-CM | POA: Diagnosis present

## 2022-10-26 DIAGNOSIS — Z89421 Acquired absence of other right toe(s): Secondary | ICD-10-CM

## 2022-10-26 DIAGNOSIS — E1151 Type 2 diabetes mellitus with diabetic peripheral angiopathy without gangrene: Secondary | ICD-10-CM | POA: Diagnosis present

## 2022-10-26 DIAGNOSIS — J449 Chronic obstructive pulmonary disease, unspecified: Secondary | ICD-10-CM

## 2022-10-26 DIAGNOSIS — K31819 Angiodysplasia of stomach and duodenum without bleeding: Secondary | ICD-10-CM

## 2022-10-26 DIAGNOSIS — J189 Pneumonia, unspecified organism: Secondary | ICD-10-CM | POA: Diagnosis not present

## 2022-10-26 DIAGNOSIS — E1165 Type 2 diabetes mellitus with hyperglycemia: Secondary | ICD-10-CM | POA: Diagnosis present

## 2022-10-26 DIAGNOSIS — J9601 Acute respiratory failure with hypoxia: Secondary | ICD-10-CM | POA: Diagnosis not present

## 2022-10-26 DIAGNOSIS — K449 Diaphragmatic hernia without obstruction or gangrene: Secondary | ICD-10-CM | POA: Diagnosis present

## 2022-10-26 DIAGNOSIS — E785 Hyperlipidemia, unspecified: Secondary | ICD-10-CM | POA: Diagnosis present

## 2022-10-26 DIAGNOSIS — Z89422 Acquired absence of other left toe(s): Secondary | ICD-10-CM

## 2022-10-26 DIAGNOSIS — I959 Hypotension, unspecified: Secondary | ICD-10-CM | POA: Diagnosis present

## 2022-10-26 DIAGNOSIS — K921 Melena: Secondary | ICD-10-CM | POA: Diagnosis not present

## 2022-10-26 DIAGNOSIS — Z7984 Long term (current) use of oral hypoglycemic drugs: Secondary | ICD-10-CM

## 2022-10-26 DIAGNOSIS — Z888 Allergy status to other drugs, medicaments and biological substances status: Secondary | ICD-10-CM

## 2022-10-26 DIAGNOSIS — Z91030 Bee allergy status: Secondary | ICD-10-CM

## 2022-10-26 DIAGNOSIS — Z720 Tobacco use: Secondary | ICD-10-CM | POA: Diagnosis present

## 2022-10-26 DIAGNOSIS — I10 Essential (primary) hypertension: Secondary | ICD-10-CM | POA: Diagnosis present

## 2022-10-26 DIAGNOSIS — Z95828 Presence of other vascular implants and grafts: Secondary | ICD-10-CM

## 2022-10-26 DIAGNOSIS — Z9049 Acquired absence of other specified parts of digestive tract: Secondary | ICD-10-CM

## 2022-10-26 DIAGNOSIS — I998 Other disorder of circulatory system: Secondary | ICD-10-CM | POA: Diagnosis present

## 2022-10-26 DIAGNOSIS — Z8249 Family history of ischemic heart disease and other diseases of the circulatory system: Secondary | ICD-10-CM | POA: Diagnosis not present

## 2022-10-26 DIAGNOSIS — Z981 Arthrodesis status: Secondary | ICD-10-CM

## 2022-10-26 DIAGNOSIS — K31811 Angiodysplasia of stomach and duodenum with bleeding: Secondary | ICD-10-CM | POA: Diagnosis present

## 2022-10-26 DIAGNOSIS — Z7902 Long term (current) use of antithrombotics/antiplatelets: Secondary | ICD-10-CM

## 2022-10-26 DIAGNOSIS — M1990 Unspecified osteoarthritis, unspecified site: Secondary | ICD-10-CM | POA: Diagnosis present

## 2022-10-26 DIAGNOSIS — Z7982 Long term (current) use of aspirin: Secondary | ICD-10-CM

## 2022-10-26 DIAGNOSIS — K279 Peptic ulcer, site unspecified, unspecified as acute or chronic, without hemorrhage or perforation: Secondary | ICD-10-CM | POA: Diagnosis present

## 2022-10-26 DIAGNOSIS — E871 Hypo-osmolality and hyponatremia: Secondary | ICD-10-CM | POA: Diagnosis present

## 2022-10-26 DIAGNOSIS — I2510 Atherosclerotic heart disease of native coronary artery without angina pectoris: Secondary | ICD-10-CM | POA: Diagnosis present

## 2022-10-26 DIAGNOSIS — E1169 Type 2 diabetes mellitus with other specified complication: Secondary | ICD-10-CM

## 2022-10-26 LAB — COMPREHENSIVE METABOLIC PANEL
ALT: 7 U/L (ref 0–35)
ALT: 11 U/L (ref 0–44)
AST: 8 U/L (ref 0–37)
AST: 15 U/L (ref 15–41)
Albumin: 3.7 g/dL (ref 3.5–5.2)
Albumin: 3.2 g/dL — ABNORMAL LOW (ref 3.5–5.0)
Alkaline Phosphatase: 95 U/L (ref 39–117)
Alkaline Phosphatase: 97 U/L (ref 38–126)
Anion gap: 12 (ref 5–15)
BUN: 13 mg/dL (ref 6–23)
BUN: 11 mg/dL (ref 8–23)
CO2: 24 mEq/L (ref 19–32)
CO2: 23 mmol/L (ref 22–32)
Calcium: 8.8 mg/dL (ref 8.4–10.5)
Calcium: 8.5 mg/dL — ABNORMAL LOW (ref 8.9–10.3)
Chloride: 99 mEq/L (ref 96–112)
Chloride: 99 mmol/L (ref 98–111)
Creatinine, Ser: 0.88 mg/dL (ref 0.40–1.20)
Creatinine, Ser: 0.98 mg/dL (ref 0.44–1.00)
GFR, Estimated: >60 mL/min (ref >60)
GFR: 69.09 mL/min (ref >60.00)
Glucose, Bld: 194 mg/dL — ABNORMAL HIGH (ref 70–99)
Glucose, Bld: 197 mg/dL — ABNORMAL HIGH (ref 70–99)
Potassium: 4 mEq/L (ref 3.5–5.1)
Potassium: 3 mmol/L — ABNORMAL LOW (ref 3.5–5.1)
Sodium: 136 mEq/L (ref 135–145)
Sodium: 134 mmol/L — ABNORMAL LOW (ref 135–145)
Total Bilirubin: 0.2 mg/dL (ref 0.2–1.2)
Total Bilirubin: 0.4 mg/dL (ref 0.3–1.2)
Total Protein: 6.5 g/dL (ref 6.0–8.3)
Total Protein: 6.9 g/dL (ref 6.5–8.1)

## 2022-10-26 LAB — CBC WITH DIFFERENTIAL/PLATELET
Abs Immature Granulocytes: 0.2 10*3/uL — ABNORMAL HIGH (ref 0.00–0.07)
Basophils Absolute: 0.1 10*3/uL (ref 0.0–0.1)
Basophils Absolute: 0.1 10*3/uL (ref 0.0–0.1)
Basophils Relative: 0.3 % (ref 0.0–3.0)
Basophils Relative: 0 %
Eosinophils Absolute: 0 10*3/uL (ref 0.0–0.7)
Eosinophils Absolute: 0.1 10*3/uL (ref 0.0–0.5)
Eosinophils Relative: 0.1 % (ref 0.0–5.0)
Eosinophils Relative: 1 %
HCT: 17.6 % — CL (ref 36.0–46.0)
HCT: 18.8 % — ABNORMAL LOW (ref 36.0–46.0)
Hemoglobin: 5.3 g/dL — CL (ref 12.0–15.0)
Hemoglobin: 5.4 g/dL — CL (ref 12.0–15.0)
Immature Granulocytes: 1 %
Lymphocytes Relative: 6.4 % — ABNORMAL LOW (ref 12.0–46.0)
Lymphocytes Relative: 10 %
Lymphs Abs: 1.4 10*3/uL (ref 0.7–4.0)
Lymphs Abs: 1.9 10*3/uL (ref 0.7–4.0)
MCH: 24.2 pg — ABNORMAL LOW (ref 26.0–34.0)
MCHC: 30 g/dL (ref 30.0–36.0)
MCHC: 28.7 g/dL — ABNORMAL LOW (ref 30.0–36.0)
MCV: 82.1 fl (ref 78.0–100.0)
MCV: 84.3 fL (ref 80.0–100.0)
Monocytes Absolute: 2.3 10*3/uL — ABNORMAL HIGH (ref 0.1–1.0)
Monocytes Absolute: 2 10*3/uL — ABNORMAL HIGH (ref 0.1–1.0)
Monocytes Relative: 10.6 % (ref 3.0–12.0)
Monocytes Relative: 10 %
Neutro Abs: 18.3 10*3/uL — ABNORMAL HIGH (ref 1.4–7.7)
Neutro Abs: 15.5 10*3/uL — ABNORMAL HIGH (ref 1.7–7.7)
Neutrophils Relative %: 82.6 % — ABNORMAL HIGH (ref 43.0–77.0)
Neutrophils Relative %: 78 %
Platelets: 634 10*3/uL — ABNORMAL HIGH (ref 150.0–400.0)
Platelets: 639 10*3/uL — ABNORMAL HIGH (ref 150–400)
RBC: 2.14 Mil/uL — ABNORMAL LOW (ref 3.87–5.11)
RBC: 2.23 MIL/uL — ABNORMAL LOW (ref 3.87–5.11)
RDW: 21.5 % — ABNORMAL HIGH (ref 11.5–15.5)
RDW: 19.1 % — ABNORMAL HIGH (ref 11.5–15.5)
WBC: 22.1 10*3/uL (ref 4.0–10.5)
WBC: 19.8 10*3/uL — ABNORMAL HIGH (ref 4.0–10.5)
nRBC: 0.3 % — ABNORMAL HIGH (ref 0.0–0.2)

## 2022-10-26 LAB — PREPARE RBC (CROSSMATCH)

## 2022-10-26 LAB — HEMOGLOBIN A1C: Hgb A1c MFr Bld: 7.3 % — ABNORMAL HIGH (ref 4.6–6.5)

## 2022-10-26 LAB — VITAMIN B12: Vitamin B-12: 469 pg/mL (ref 180–914)

## 2022-10-26 LAB — URINALYSIS, ROUTINE W REFLEX MICROSCOPIC
Bacteria, UA: NONE SEEN
Bilirubin Urine: NEGATIVE
Glucose, UA: NEGATIVE mg/dL
Ketones, ur: NEGATIVE mg/dL
Nitrite: NEGATIVE
Protein, ur: NEGATIVE mg/dL
Specific Gravity, Urine: 1.004 — ABNORMAL LOW (ref 1.005–1.030)
pH: 6 (ref 5.0–8.0)

## 2022-10-26 LAB — RETICULOCYTES
Immature Retic Fract: 16 % — ABNORMAL HIGH (ref 2.3–15.9)
RBC.: 2.2 MIL/uL — ABNORMAL LOW (ref 3.87–5.11)
Retic Count, Absolute: 67 10*3/uL (ref 19.0–186.0)
Retic Ct Pct: 3 % (ref 0.4–3.1)

## 2022-10-26 LAB — FOLATE: Folate: 7.7 ng/mL (ref >5.9)

## 2022-10-26 LAB — LIPID PANEL
Cholesterol: 125 mg/dL (ref 0–200)
HDL: 26.8 mg/dL — ABNORMAL LOW (ref >39.00)
NonHDL: 97.74
Total CHOL/HDL Ratio: 5
Triglycerides: 223 mg/dL — ABNORMAL HIGH (ref 0.0–149.0)
VLDL: 44.6 mg/dL — ABNORMAL HIGH (ref 0.0–40.0)

## 2022-10-26 LAB — IRON AND TIBC
Iron: 12 ug/dL — ABNORMAL LOW (ref 28–170)
Saturation Ratios: 2 % — ABNORMAL LOW (ref 10.4–31.8)
TIBC: 543 ug/dL — ABNORMAL HIGH (ref 250–450)
UIBC: 531 ug/dL

## 2022-10-26 LAB — FERRITIN: Ferritin: 8 ng/mL — ABNORMAL LOW (ref 11–307)

## 2022-10-26 LAB — MRSA NEXT GEN BY PCR, NASAL: MRSA by PCR Next Gen: NOT DETECTED

## 2022-10-26 LAB — GLUCOSE, CAPILLARY
Glucose-Capillary: 122 mg/dL — ABNORMAL HIGH (ref 70–99)
Glucose-Capillary: 118 mg/dL — ABNORMAL HIGH (ref 70–99)

## 2022-10-26 LAB — LDL CHOLESTEROL, DIRECT: Direct LDL: 64 mg/dL

## 2022-10-26 MED ORDER — SODIUM CHLORIDE 0.9 % IV SOLN: INTRAVENOUS | Status: AC

## 2022-10-26 MED ORDER — SODIUM CHLORIDE 0.9 % IV BOLUS
1000.0000 mL | Freq: Once | INTRAVENOUS | Status: AC
  Administered 2022-10-26: 1000 mL via INTRAVENOUS

## 2022-10-26 MED ORDER — SODIUM CHLORIDE 0.9 % IV SOLN: INTRAVENOUS | Status: DC

## 2022-10-26 MED ORDER — INSULIN ASPART 100 UNIT/ML IJ SOLN
0.0000 [IU] | INTRAMUSCULAR | Status: DC
  Administered 2022-10-27 – 2022-10-28 (×8): 1 [IU] via SUBCUTANEOUS
  Administered 2022-10-28: 2 [IU] via SUBCUTANEOUS
  Administered 2022-10-29 (×2): 1 [IU] via SUBCUTANEOUS

## 2022-10-26 MED ORDER — PANTOPRAZOLE SODIUM 40 MG IV SOLR: 40.0000 mg | Freq: Two times a day (BID) | INTRAVENOUS | Status: DC

## 2022-10-26 MED ORDER — POTASSIUM CHLORIDE CRYS ER 20 MEQ PO TBCR
40.0000 meq | EXTENDED_RELEASE_TABLET | Freq: Two times a day (BID) | ORAL | Status: DC
  Administered 2022-10-26: 40 meq via ORAL
  Filled 2022-10-26: qty 2

## 2022-10-26 MED ORDER — OCTREOTIDE LOAD VIA INFUSION
100.0000 ug | Freq: Once | INTRAVENOUS | Status: DC
  Filled 2022-10-26: qty 50

## 2022-10-26 MED ORDER — HYDROMORPHONE HCL 1 MG/ML IJ SOLN
0.5000 mg | INTRAMUSCULAR | Status: DC | PRN
  Administered 2022-10-26 – 2022-10-27 (×4): 1 mg via INTRAVENOUS
  Administered 2022-10-28 – 2022-10-29 (×3): 0.5 mg via INTRAVENOUS
  Filled 2022-10-26: qty 0.5
  Filled 2022-10-26 (×2): qty 1
  Filled 2022-10-26 (×2): qty 0.5
  Filled 2022-10-26 (×3): qty 1

## 2022-10-26 MED ORDER — GABAPENTIN 300 MG PO CAPS
300.0000 mg | ORAL_CAPSULE | Freq: Three times a day (TID) | ORAL | Status: DC
  Administered 2022-10-26 – 2022-10-29 (×6): 300 mg via ORAL
  Filled 2022-10-26 (×6): qty 1

## 2022-10-26 MED ORDER — CHLORHEXIDINE GLUCONATE CLOTH 2 % EX PADS
6.0000 | MEDICATED_PAD | Freq: Every day | CUTANEOUS | Status: DC
  Administered 2022-10-27: 6 via TOPICAL

## 2022-10-26 MED ORDER — SODIUM CHLORIDE 0.9 % IV SOLN
50.0000 ug/h | INTRAVENOUS | Status: DC
  Filled 2022-10-26 (×3): qty 1

## 2022-10-26 MED ORDER — ALPRAZOLAM 0.5 MG PO TABS
0.5000 mg | ORAL_TABLET | Freq: Two times a day (BID) | ORAL | Status: DC | PRN
  Administered 2022-10-28: 0.5 mg via ORAL
  Filled 2022-10-26: qty 1

## 2022-10-26 MED ORDER — ACETAMINOPHEN 650 MG RE SUPP: 650.0000 mg | Freq: Four times a day (QID) | RECTAL | Status: DC | PRN

## 2022-10-26 MED ORDER — FLUOXETINE HCL 20 MG PO CAPS
40.0000 mg | ORAL_CAPSULE | Freq: Every day | ORAL | Status: DC
  Administered 2022-10-26 – 2022-10-29 (×3): 40 mg via ORAL
  Filled 2022-10-26 (×3): qty 2

## 2022-10-26 MED ORDER — ONDANSETRON HCL 4 MG PO TABS: 4.0000 mg | ORAL_TABLET | Freq: Four times a day (QID) | ORAL | Status: DC | PRN

## 2022-10-26 MED ORDER — PANTOPRAZOLE INFUSION (NEW) - SIMPLE MED
8.0000 mg/h | INTRAVENOUS | Status: DC
  Administered 2022-10-26 – 2022-10-29 (×8): 8 mg/h via INTRAVENOUS
  Filled 2022-10-26 (×4): qty 80
  Filled 2022-10-26: qty 100
  Filled 2022-10-26 (×2): qty 80
  Filled 2022-10-26: qty 100

## 2022-10-26 MED ORDER — ACETAMINOPHEN 325 MG PO TABS
650.0000 mg | ORAL_TABLET | Freq: Four times a day (QID) | ORAL | Status: DC | PRN
  Administered 2022-10-26 – 2022-10-28 (×3): 650 mg via ORAL
  Filled 2022-10-26 (×3): qty 2

## 2022-10-26 MED ORDER — SODIUM CHLORIDE 0.9% IV SOLUTION: Freq: Once | INTRAVENOUS | Status: DC

## 2022-10-26 MED ORDER — FAMOTIDINE IN NACL 20-0.9 MG/50ML-% IV SOLN
20.0000 mg | INTRAVENOUS | Status: AC
  Administered 2022-10-26: 20 mg via INTRAVENOUS
  Filled 2022-10-26: qty 50

## 2022-10-26 MED ORDER — ONDANSETRON HCL 4 MG/2ML IJ SOLN
4.0000 mg | Freq: Four times a day (QID) | INTRAMUSCULAR | Status: DC | PRN
  Administered 2022-10-26: 4 mg via INTRAVENOUS
  Filled 2022-10-26: qty 2

## 2022-10-26 MED ORDER — PANTOPRAZOLE 80MG IVPB - SIMPLE MED
80.0000 mg | Freq: Once | INTRAVENOUS | Status: AC
  Administered 2022-10-26: 80 mg via INTRAVENOUS
  Filled 2022-10-26: qty 100

## 2022-10-26 NOTE — H&P (Signed)
History and Physical    Tammy Mcpherson:[id number] DOB: [birth year] DOA: 10/26/2022  PCP: Inda Coke, PA   Patient coming from: Home  Chief Complaint: Low hemoglobin/weakness  HPI: Tammy Mcpherson is a 67 y.o. female with medical history significant for COPD with chronic tobacco abuse, hypertension, PAD with prior stents and toe amputations, type 2 diabetes, iron deficiency anemia, prior findings of erosive gastritis as well as diverticulosis and internal hemorrhoids who presented to the ED with generalized weakness and noted low hemoglobin levels from labs drawn in her PCP office on 1/24.  She was also noted to have complaints of dark stools for the last 2 weeks.  She currently takes aspirin and Plavix daily for lower extremity claudication.   ED Course: Vital signs stable and patient afebrile.  She has received fluid bolus and is now on Protonix infusion.  Potassium is 3.0 and hemoglobin 5.4 with 2 units of PRBC transfusion pending.  Leukocytosis of 19,800 noted.  EKG was sinus tachycardia.  Review of Systems: Reviewed as noted above, otherwise negative.  Past Medical History:  Diagnosis Date   Anemia 04/30/2016   Anxiety    Arthritis    Cardiomyopathy ([place])    a. EF 40-45% by echo in 04/2016 b. Improved to 60-65% by repeat imaging in 2018   COPD (chronic obstructive pulmonary disease) (HCC)    Coronary artery calcification seen on CT scan    Essential hypertension    GERD (gastroesophageal reflux disease)    Headache    History of bronchitis    History of GI bleed    History of hiatal hernia    Hyperlipidemia    Iron deficiency anemia    Peripheral vascular disease (HCC)    Pollen allergy    PSVT (paroxysmal supraventricular tachycardia)    PVC's (premature ventricular contractions)    Type 2 diabetes mellitus ([place])    Vitamin B12 deficiency     Past Surgical History:  Procedure Laterality Date   ABDOMINAL AORTOGRAM W/LOWER EXTREMITY Bilateral 05/19/2021    Procedure: ABDOMINAL AORTOGRAM W/LOWER EXTREMITY;  Surgeon: [name], MD;  Location: [facility];  Service: Cardiovascular;  Laterality: Bilateral;   ABDOMINAL HYSTERECTOMY     polyps  about age 38   AIKEN OSTEOTOMY Right 07/10/2013   Procedure: Barbie Banner OSTEOTOMY RIGHT FOOT;  Surgeon: Marcheta Grammes, DPM;  Location: [facility];  Service: Orthopedics;  Laterality: Right;   AMPUTATION Left 05/09/2017   Procedure: AMPUTATION 5TH TOE LEFT FOOT;  Surgeon: [name], DPM;  Location: [facility];  Service: Podiatry;  Laterality: Left;   AMPUTATION Left 06/13/2017   Procedure: AMPUTATION 2ND TOE LEFT FOOT;  Surgeon: [name], DPM;  Location: [facility];  Service: Podiatry;  Laterality: Left;   AMPUTATION Right 06/01/2021   Procedure: Right great toe and Second Toe Amputation;  Surgeon: [name], MD;  Location: [facility];  Service: Vascular;  Laterality: Right;   BUNIONECTOMY Right 07/10/2013   Procedure: Prudy Feeler BUNIONECTOMY RIGHT FOOT;  Surgeon: Marcheta Grammes, DPM;  Location: [facility];  Service: Orthopedics;  Laterality: Right;   CHOLECYSTECTOMY N/A 08/22/2017   Procedure: LAPAROSCOPIC CHOLECYSTECTOMY;  Surgeon: Virl Cagey, MD;  Location: [facility];  Service: General;  Laterality: N/A;   COLONOSCOPY N/A 07/07/2016   Dr. Oneida Alar; redundant left colon, diverticulosis at hepatic flexure, non-bleeding internal hemorrhoids   ESOPHAGOGASTRODUODENOSCOPY N/A 07/07/2016   Dr. Oneida Alar: many non-bleeding cratered gastric ulcers without stigmata of bleeding in gastric antrum. four 2-3 mm angioectasias  without bleeding in duodenal bulb and second portion of duodenum s/p APC. Chroni gastritis on path.    ESOPHAGOGASTRODUODENOSCOPY N/A 08/03/2017   Dr. Oneida Alar: erosive gastritis, AVMs. Found a single non-bleeding angioectasia in stomach, s/p APC therapy. Four non-bleeding angioectasias in duodenum s/p APC. Non-bleeding erosive gastropathy   IR ANGIOGRAM EXTREMITY LEFT  04/24/2017   IR  FEM POP ART ATHERECT INC PTA MOD SED  04/24/2017   IR INFUSION THROMBOL ARTERIAL INITIAL (MS)  04/24/2017   IR RADIOLOGIST EVAL & MGMT  12/05/2016   IR US GUIDE VASC ACCESS RIGHT  04/24/2017   LIVER BIOPSY N/A 08/22/2017   Procedure: LIVER BIOPSY;  Surgeon: Virl Cagey, MD;  Location: [facility];  Service: General;  Laterality: N/A;   METATARSAL HEAD EXCISION Right 07/10/2013   Procedure: METATARSAL HEAD RESECTION OF DIGITS 2 AND 3 RIGHT FOOT;  Surgeon: Marcheta Grammes, DPM;  Location: [facility];  Service: Orthopedics;  Laterality: Right;   PERIPHERAL VASCULAR INTERVENTION Right 05/19/2021   Procedure: PERIPHERAL VASCULAR INTERVENTION;  Surgeon: [name], MD;  Location: [facility];  Service: Cardiovascular;  Laterality: Right;   PROXIMAL INTERPHALANGEAL FUSION (PIP) Right 07/10/2013   Procedure: ARTHRODESIS PIPJ  2ND DIGIT RIGHT FOOT;  Surgeon: Marcheta Grammes, DPM;  Location: [facility];  Service: Orthopedics;  Laterality: Right;     reports that she has been smoking cigarettes. She started smoking about 47 years ago. She has a 22.00 pack-year smoking history. She has never been exposed to tobacco smoke. She has never used smokeless tobacco. She reports that she does not currently use alcohol after a past usage of about 2.0 standard drinks of alcohol per week. She reports that she does not use drugs.  Allergies  Allergen Reactions   Bee Venom Swelling   Codeine Anaphylaxis    Tongue swelled   Penicillins Swelling and Rash    Has patient had a PCN reaction causing immediate rash, facial/tongue/throat swelling, SOB or lightheadedness with hypotension: No, delayed Has patient had a PCN reaction causing severe rash involving mucus membranes or skin necrosis: No Has patient had a PCN reaction that required hospitalization No Has patient had a PCN reaction occurring within the last 10 years: No If all of the above answers are "NO", then may proceed with Cephalosporin use.     Bupropion Other (See Comments)    "Made my skin crawl"   Sudafed [Pseudoephedrine Hcl] Rash    Family History  Adopted: Yes  Problem Relation Age of Onset   Hypertension Father    Coronary artery disease Sister    Ulcers Maternal Grandmother    Colon cancer Neg Hx        unknown, was adopted    Prior to Admission medications   Medication Sig Start Date End Date Taking? Authorizing Provider  acetaminophen (TYLENOL) 500 MG tablet Take 500 mg by mouth at bedtime as needed for moderate pain.   Yes [provider]  albuterol (PROVENTIL) (2.5 MG/3ML) 0.083% nebulizer solution Take 3 mLs (2.5 mg total) by nebulization every 4 (four) hours as needed for wheezing or shortness of breath. 04/26/22  Yes [name], NP  albuterol (VENTOLIN HFA) 108 (90 Base) MCG/ACT inhaler INHALE TWO PUFFS INTO THE LUNGS EVERY SIX HOURS AS NEEDED FOR WHEEZING OR SHORTNESS OF BREATH 04/26/22  Yes [name], NP  ALPRAZolam Duanne Moron) 0.5 MG tablet TAKE ONE TABLET (0.'5MG'$  TOTAL) BY MOUTH DAILY AS NEEDED FOR ANXIETY 04/25/22  Yes [name], NP  aspirin 81  MG chewable tablet Chew 81 mg by mouth every morning.    Yes [provider]  atorvastatin (LIPITOR) 80 MG tablet Take 1 tablet (80 mg total) by mouth daily. 06/07/22  Yes Satira Sark, MD  Budeson-Glycopyrrol-Formoterol (BREZTRI AEROSPHERE) 160-9-4.8 MCG/ACT AERO Inhale 2 puffs into the lungs 2 (two) times daily. 11/15/20  Yes Icard, Octavio Graves, DO  Cholecalciferol (VITAMIN D) 50 MCG (2000 UT) CAPS Take 1 capsule (2,000 Units total) by mouth daily. 03/15/22  Yes Derek Jack, MD  clopidogrel (PLAVIX) 75 MG tablet TAKE ONE TABLET ('75MG'$  TOTAL) BY MOUTH DAILY WITH BREAKFAST 04/26/22  Yes [name], NP  cyanocobalamin (VITAMIN B12) 1000 MCG/ML injection INJECT 1 ML INTO THE MUSCLE EVERY 30 DAYS. Patient taking differently: Inject 1,000 mcg into the skin every 30 (thirty) days. 08/09/22  Yes Derek Jack, MD  cyclobenzaprine  (FLEXERIL) 5 MG tablet Take 1 tablet (5 mg total) by mouth 3 (three) times daily as needed for muscle spasms. Patient taking differently: Take 2.5-5 mg by mouth 3 (three) times daily as needed for muscle spasms. 04/25/22  Yes [name], NP  FLUoxetine (PROZAC) 40 MG capsule Take 1 capsule (40 mg total) by mouth daily. 04/26/22  Yes [name], NP  gabapentin (NEURONTIN) 300 MG capsule TAKE ONE CAPSULE (300 MG TOTAL) BY MOUTHTWO TIMES DAILY. Patient taking differently: Take 300 mg by mouth 3 (three) times daily. TAKE ONE CAPSULE (300 MG TOTAL) BY MOUTHTWO TIMES DAILY. 04/26/22  Yes [name], NP  pantoprazole (PROTONIX) 40 MG tablet TAKE ONE (1) TABLET 30 MINS BEFORE YOUR FIRST MEAL. 04/26/22  Yes [name], NP  sacubitril-valsartan (ENTRESTO) 49-51 MG Take 1 tablet by mouth 2 (two) times daily. Patient taking differently: Take 1 tablet by mouth daily. 06/07/22  Yes Satira Sark, MD  traMADol (ULTRAM) 50 MG tablet Take 1 tablet (50 mg total) by mouth every 12 (twelve) hours as needed for severe pain. 10/25/22  Yes Inda Coke, PA  Tuberculin-Allergy Syringes Flossie Buffy TUBERCULIN SAFETY SYR) 25G X 1" 1 ML MISC USE TO ADMINISTER INJECTABLE VITAMIN B12. 08/09/22  Yes Derek Jack, MD  blood glucose meter kit and supplies Dispense based on patient and insurance preference. Use up to four times daily as directed. (FOR ICD-10 E10.9, E11.9). 11/22/20   [name], NP  Continuous Blood Gluc Receiver (FREESTYLE LIBRE 2 READER) DEVI Check blood glucose up to 4 times daily 10/11/22   Inda Coke, PA  Continuous Blood Gluc Sensor (FREESTYLE LIBRE 2 SENSOR) MISC Apply one sensor to the upper arm avery 14 days. 04/26/22   [name], NP  glucose blood [facility] ULTRA) test strip Use as instructed 02/26/19   Letta Median K, DO  glucose monitoring kit (FREESTYLE) monitoring kit Use to check blood sugar BID as directed 01/24/19   Cirigliano, Stanton Kidney K, DO  Lancets (ONETOUCH  DELICA PLUS [id number]) MISC 1 each by Does not apply route 2 (two) times a day. 02/26/19   Cirigliano, Garvin Fila, DO  metFORMIN (GLUCOPHAGE-XR) 500 MG 24 hr tablet Take 1 tablet (500 mg total) by mouth daily with breakfast. 10/25/22   Inda Coke, PA  Misc. Devices MISC Please provide supplies (needle, syringe, alcohol swabs) needed for patient to self-administer B-12 injections monthly. 01/07/20   Derek Jack, MD    Physical Exam: Vitals:   10/26/22 1300 10/26/22 1330 10/26/22 1400 10/26/22 1500  BP: (!) 120/46 (!) 137/54 (!) 124/54 (!) 151/59  Pulse: (!) 101 95 (!) 102 99  Resp: (!) '24 19 20 '$ (!)  21  Temp:    98.4 F (36.9 C)  TempSrc:    Oral  SpO2: (!) 85% 100% 99% 100%  Weight:      Height:        Constitutional: NAD, calm, comfortable Vitals:   10/26/22 1300 10/26/22 1330 10/26/22 1400 10/26/22 1500  BP: (!) 120/46 (!) 137/54 (!) 124/54 (!) 151/59  Pulse: (!) 101 95 (!) 102 99  Resp: (!) '24 19 20 '$ (!) 21  Temp:    98.4 F (36.9 C)  TempSrc:    Oral  SpO2: (!) 85% 100% 99% 100%  Weight:      Height:       Eyes: lids and conjunctivae normal Neck: normal, supple Respiratory: clear to auscultation bilaterally. Normal respiratory effort. No accessory muscle use. 3L [place] Cardiovascular: Regular rate and rhythm, no murmurs.  Tachycardic Abdomen: no tenderness, no distention. Bowel sounds positive.  Musculoskeletal:  No edema. Skin: no rashes, lesions, ulcers.  Psychiatric: Flat affect  Labs on Admission: I have personally reviewed following labs and imaging studies  CBC: Recent Labs  Lab 10/25/22 1400 10/26/22 1250  WBC 22.1 Repeated and verified X2.* 19.8*  NEUTROABS 18.3* 15.5*  HGB 5.3 Repeated and verified X2.* 5.4*  HCT 17.6 Repeated and verified X2.* 18.8*  MCV 82.1 84.3  PLT 634.0* 591*   Basic Metabolic Panel: Recent Labs  Lab 10/25/22 1400 10/26/22 1250  NA 136 134*  K 4.0 3.0*  CL 99 99  CO2 24 23  GLUCOSE 194* 197*  BUN 13 11  CREATININE  0.88 0.98  CALCIUM 8.8 8.5*   GFR: Estimated Creatinine Clearance: 47.7 mL/min (by C-G formula based on SCr of 0.98 mg/dL). Liver Function Tests: Recent Labs  Lab 10/25/22 1400 10/26/22 1250  AST 8 15  ALT 7 11  ALKPHOS 95 97  BILITOT 0.2 0.4  PROT 6.5 6.9  ALBUMIN 3.7 3.2*   No results for input(s): "LIPASE", "AMYLASE" in the last 168 hours. No results for input(s): "AMMONIA" in the last 168 hours. Coagulation Profile: No results for input(s): "INR", "PROTIME" in the last 168 hours. Cardiac Enzymes: No results for input(s): "CKTOTAL", "CKMB", "CKMBINDEX", "TROPONINI" in the last 168 hours. BNP (last 3 results) No results for input(s): "PROBNP" in the last 8760 hours. HbA1C: Recent Labs    10/25/22 1400  HGBA1C 7.3*   CBG: No results for input(s): "GLUCAP" in the last 168 hours. Lipid Profile: Recent Labs    10/25/22 1400  CHOL 125  HDL 26.80*  TRIG 223.0*  CHOLHDL 5  LDLDIRECT 64.0   Thyroid Function Tests: No results for input(s): "TSH", "T4TOTAL", "FREET4", "T3FREE", "THYROIDAB" in the last 72 hours. Anemia Panel: Recent Labs    10/26/22 1250  VITAMINB12 469  FOLATE 7.7  FERRITIN 8*  TIBC 543*  IRON 12*  RETICCTPCT 3.0   Urine analysis:    Component Value Date/Time   COLORURINE YELLOW 04/26/2022 1508   APPEARANCEUR Cloudy (A) 04/26/2022 1508   LABSPEC 1.020 04/26/2022 1508   PHURINE 6.0 04/26/2022 1508   GLUCOSEU NEGATIVE 04/26/2022 1508   HGBUR MODERATE (A) 04/26/2022 1508   BILIRUBINUR SMALL (A) 04/26/2022 1508   BILIRUBINUR negative 06/17/2020 1534   KETONESUR TRACE (A) 04/26/2022 1508   PROTEINUR negative 06/17/2020 1534   PROTEINUR NEGATIVE 01/21/2019 1645   UROBILINOGEN 0.2 04/26/2022 1508   NITRITE NEGATIVE 04/26/2022 1508   LEUKOCYTESUR MODERATE (A) 04/26/2022 1508    Radiological Exams on Admission: DG Chest Port 1 View  Result Date: 10/26/2022 CLINICAL  DATA:  SOB EXAM: PORTABLE CHEST 1 VIEW COMPARISON:  10/24/20 CXR FINDINGS:  No pleural effusion. No pneumothorax. Normal cardiac and mediastinal contours. Bibasilar atelectasis. Visualized upper abdomen is unremarkable. No displaced rib fractures. IMPRESSION: Bibasilar atelectasis. Electronically Signed   By: Marin Roberts M.D.   On: 10/26/2022 14:19    EKG: Independently reviewed. ST 111bpm.  Assessment/Plan Principal Problem:   Acute blood loss anemia Active Problems:   Essential hypertension   Tobacco abuse   Anemia, iron deficiency   Hyperlipidemia   Neuropathy due to type 2 diabetes mellitus (HCC)   Type 2 diabetes mellitus with hyperglycemia, without long-term current use of insulin (HCC)   Diabetes mellitus (HCC)   Lower limb ischemia   GERD (gastroesophageal reflux disease)   GAD (generalized anxiety disorder)   Peptic ulcer disease   Upper GI bleed   Severe anemia   Acute blood loss anemia secondary to suspected UGI bleed Has history of chronic anemia requiring iron infusions Noted history of IBS with gastric and duodenal AVMs status post ablation in 2018 EGD in 2018 with erosive gastritis Nonbleeding internal hemorrhoids and diverticulosis noted on colonoscopy 2017 Transfusion with 2 unit PRBCs ordered and pending due to antibody issues Vital signs are currently stable, monitor in stepdown unit with Protonix infusion Keep n.p.o. and continue IV fluid Appreciate GI evaluation for EGD/enteroscopy this admission  Mild hypokalemia/mild hyponatremia Replete IV and monitor IV normal saline  Type 2 diabetes with hyperglycemia SSI every 4 hours while NPO Recent A1c 7.3%  Lower extremity claudication/PAD with prior toe amputations Continue gabapentin Hold aspirin and Plavix  COPD Continue home inhalers No acute bronchospasms noted  Dyslipidemia Continue Lipitor  Ongoing tobacco abuse Counseled on cessation, but not interested in quitting    DVT prophylaxis: SCDs Code Status: Full Family Communication:Husband at bedside  Disposition  Plan:Admit for GI bleed evaluation and transfusion Consults called:GI Admission status:Inpatient, SDU   Severity of Illness: The appropriate patient status for this patient is INPATIENT. Inpatient status is judged to be reasonable and necessary in order to provide the required intensity of service to ensure the patient's safety. The patient's presenting symptoms, physical exam findings, and initial radiographic and laboratory data in the context of their chronic comorbidities is felt to place them at high risk for further clinical deterioration. Furthermore, it is not anticipated that the patient will be medically stable for discharge from the hospital within 2 midnights of admission.   * I certify that at the point of admission it is my clinical judgment that the patient will require inpatient hospital care spanning beyond 2 midnights from the point of admission due to high intensity of service, high risk for further deterioration and high frequency of surveillance required.*   Jaun Galluzzo D Scotti Motter DO [facility]  If 7PM-7AM, please contact night-coverage [email or web address]  10/26/2022, 3:13 PM

## 2022-10-26 NOTE — ED Notes (Signed)
Per blood bank, pt positive for antibodies and blood bank will send sample to [facility] for further testing

## 2022-10-26 NOTE — Telephone Encounter (Signed)
Left message on voicemail to call office.  

## 2022-10-26 NOTE — ED Provider Notes (Signed)
Tammy Mcpherson Provider Note   CSN: [id number] Arrival date & time: 10/26/22  1159     History  Chief Complaint  Patient presents with   abnormal labs   Hypotension    Noted in triage    NA Tammy Mcpherson is a 67 y.o. female.  HPI   This patient is a 67 year old female, she presents to the hospital today with a complaint of generalized weakness and to report that she was severely anemic based on the visit that she had with her doctor yesterday.  She had gone in because of leg pain, she has chronic claudication and has had multiple vascular abnormalities that required stenting Plavix and aspirin, when they did blood work they found that her hemoglobin was around 5, they called her this morning to tell her to come to the hospital.  The patient reports to me that she is actually having dark black stools for the last couple of weeks, she does have some epigastric discomfort, she is not vomiting but has some nausea.  Home Medications Prior to Admission medications   Medication Sig Start Date End Date Taking? Authorizing Provider  acetaminophen (TYLENOL) 500 MG tablet Take 500 mg by mouth at bedtime as needed for moderate pain.    [provider]  albuterol (PROVENTIL) (2.5 MG/3ML) 0.083% nebulizer solution Take 3 mLs (2.5 mg total) by nebulization every 4 (four) hours as needed for wheezing or shortness of breath. 04/26/22   [name], NP  albuterol (VENTOLIN HFA) 108 (90 Base) MCG/ACT inhaler INHALE TWO PUFFS INTO THE LUNGS EVERY SIX HOURS AS NEEDED FOR WHEEZING OR SHORTNESS OF BREATH 04/26/22   [name], NP  ALPRAZolam Duanne Moron) 0.5 MG tablet TAKE ONE TABLET (0.'5MG'$  TOTAL) BY MOUTH DAILY AS NEEDED FOR ANXIETY 04/25/22   [name], NP  aspirin 81 MG chewable tablet Chew 81 mg by mouth every morning.     [provider]  atorvastatin (LIPITOR) 80 MG tablet Take 1 tablet (80 mg total) by mouth daily. 06/07/22   Satira Sark, MD  bisoprolol (ZEBETA) 10 MG tablet TAKE ONE (1) TABLET BY MOUTH EVERY DAY 04/25/22   [name], NP  blood glucose meter kit and supplies Dispense based on patient and insurance preference. Use up to four times daily as directed. (FOR ICD-10 E10.9, E11.9). 11/22/20   [name], NP  Budeson-Glycopyrrol-Formoterol (BREZTRI AEROSPHERE) 160-9-4.8 MCG/ACT AERO Inhale 2 puffs into the lungs 2 (two) times daily. 11/15/20   Garner Nash, DO  Cholecalciferol (VITAMIN D) 50 MCG (2000 UT) CAPS Take 1 capsule (2,000 Units total) by mouth daily. 03/15/22   Derek Jack, MD  clopidogrel (PLAVIX) 75 MG tablet TAKE ONE TABLET ('75MG'$  TOTAL) BY MOUTH DAILY WITH BREAKFAST 04/26/22   [name], NP  Continuous Blood Gluc Receiver (FREESTYLE LIBRE 2 READER) DEVI Check blood glucose up to 4 times daily 10/11/22   Inda Coke, PA  Continuous Blood Gluc Sensor (FREESTYLE LIBRE 2 SENSOR) MISC Apply one sensor to the upper arm avery 14 days. 04/26/22   [name], NP  cyanocobalamin (VITAMIN B12) 1000 MCG/ML injection INJECT 1 ML INTO THE MUSCLE EVERY 30 DAYS. 08/09/22   Derek Jack, MD  cyclobenzaprine (FLEXERIL) 5 MG tablet Take 1 tablet (5 mg total) by mouth 3 (three) times daily as needed for muscle spasms. 04/25/22   [name], NP  dicyclomine (BENTYL) 10 MG capsule Take 10 mg by mouth as needed for spasms.    [provider]  FLUoxetine (PROZAC) 40 MG capsule Take 1 capsule (40 mg total) by mouth daily. 04/26/22   [name], NP  gabapentin (NEURONTIN) 300 MG capsule TAKE ONE CAPSULE (300 MG TOTAL) BY MOUTHTWO TIMES DAILY. 04/26/22   [name], NP  glucose blood [facility] ULTRA) test strip Use as instructed 02/26/19   Letta Median K, DO  glucose monitoring kit (FREESTYLE) monitoring kit Use to check blood sugar BID as directed 01/24/19   Cirigliano, Stanton Kidney K, DO  lactase (LACTAID) 3000 units tablet Take by mouth as needed.    [provider]  Lancets [facility] DELICA PLUS QIONGE95M) [place] 1 each by Does not apply route 2 (two) times a day. 02/26/19   Cirigliano, Garvin Fila, DO  metFORMIN (GLUCOPHAGE-XR) 500 MG 24 hr tablet Take 1 tablet (500 mg total) by mouth daily with breakfast. 10/25/22   Inda Coke, PA  Misc. Devices MISC Please provide supplies (needle, syringe, alcohol swabs) needed for patient to self-administer B-12 injections monthly. 01/07/20   Derek Jack, MD  pantoprazole (PROTONIX) 40 MG tablet TAKE ONE (1) TABLET 30 MINS BEFORE YOUR FIRST MEAL. 04/26/22   [name], NP  sacubitril-valsartan (ENTRESTO) 49-51 MG Take 1 tablet by mouth 2 (two) times daily. 06/07/22   Satira Sark, MD  spironolactone (ALDACTONE) 50 MG tablet Take 1 tablet (50 mg total) by mouth daily. 12/07/20   Cassandria Anger, MD  traMADol (ULTRAM) 50 MG tablet Take 1 tablet (50 mg total) by mouth every 12 (twelve) hours as needed for severe pain. 10/25/22   Inda Coke, PA  Tuberculin-Allergy Syringes Flossie Buffy TUBERCULIN SAFETY SYR) 25G X 1" 1 ML MISC USE TO ADMINISTER INJECTABLE VITAMIN B12. 08/09/22   Derek Jack, MD      Allergies    Bee venom, Codeine, Penicillins, Bupropion, and Sudafed [pseudoephedrine hcl]    Review of Systems   Review of Systems  All other systems reviewed and are negative.   Physical Exam Updated Vital Signs BP (!) 124/54   Pulse (!) 102   Temp 98.3 F (36.8 C) (Oral)   Resp 20   Ht 1.549 m ('5\' 1"'$ )   Wt 60.3 kg   SpO2 99%   BMI 25.13 kg/m  Physical Exam Vitals and nursing note reviewed.  Constitutional:      General: She is not in acute distress.    Appearance: She is well-developed.  HENT:     Head: Normocephalic and atraumatic.     Mouth/Throat:     Mouth: Mucous membranes are moist.     Pharynx: No oropharyngeal exudate.     Comments: Pale mucous membranes Eyes:     General: No scleral icterus.       Right eye: No discharge.        Left eye: No discharge.      Pupils: Pupils are equal, round, and reactive to light.     Comments: Pale conjunctive a  Neck:     Thyroid: No thyromegaly.     Vascular: No JVD.  Cardiovascular:     Rate and Rhythm: Regular rhythm. Tachycardia present.     Heart sounds: Normal heart sounds. No murmur heard.    No friction rub. No gallop.  Pulmonary:     Effort: Pulmonary effort is normal. No respiratory distress.     Breath sounds: Normal breath sounds. No wheezing or rales.  Abdominal:     General: Bowel sounds are normal. There is no distension.     Palpations: Abdomen  is soft. There is no mass.     Tenderness: There is abdominal tenderness.     Comments: Mild to moderate tenderness in the epigastrium  Musculoskeletal:        General: Tenderness present. Normal range of motion.     Cervical back: Normal range of motion and neck supple.     Right lower leg: No edema.     Left lower leg: No edema.     Comments: Mild tenderness bilateral lower extremities, capillary refill slightly prolonged bilaterally  Lymphadenopathy:     Cervical: No cervical adenopathy.  Skin:    General: Skin is warm and dry.     Findings: No erythema or rash.  Neurological:     General: No focal deficit present.     Mental Status: She is alert.     Coordination: Coordination normal.  Psychiatric:        Behavior: Behavior normal.     ED Results / Procedures / Treatments   Labs (all labs ordered are listed, but only abnormal results are displayed) Labs Reviewed  CBC WITH DIFFERENTIAL/PLATELET - Abnormal; Notable for the following components:      Result Value   WBC 19.8 (*)    RBC 2.23 (*)    Hemoglobin 5.4 (*)    HCT 18.8 (*)    MCH 24.2 (*)    MCHC 28.7 (*)    RDW 19.1 (*)    Platelets 639 (*)    nRBC 0.3 (*)    Neutro Abs 15.5 (*)    Monocytes Absolute 2.0 (*)    Abs Immature Granulocytes 0.20 (*)    All other components within normal limits  COMPREHENSIVE METABOLIC PANEL - Abnormal; Notable for the following  components:   Sodium 134 (*)    Potassium 3.0 (*)    Glucose, Bld 197 (*)    Calcium 8.5 (*)    Albumin 3.2 (*)    All other components within normal limits  FOLATE  VITAMIN B12  IRON AND TIBC  FERRITIN  RETICULOCYTES  URINALYSIS, ROUTINE W REFLEX MICROSCOPIC  TYPE AND SCREEN  PREPARE RBC (CROSSMATCH)    EKG EKG Interpretation  Date/Time:  Thursday October 26 2022 12:35:48 EST Ventricular Rate:  111 PR Interval:  130 QRS Duration: 88 QT Interval:  366 QTC Calculation: 497 R Axis:   46 Text Interpretation: Sinus tachycardia ST & T wave abnormality, consider lateral ischemia Abnormal ECG When compared with ECG of 01-Jun-2021 06:06, T wave inversion now evident in Lateral leads Confirmed by Noemi Chapel [phone]) on 10/26/2022 12:51:21 PM  Radiology No results found.  Procedures .Critical Care  Performed by: Noemi Chapel, MD Authorized by: Noemi Chapel, MD   Critical care provider statement:    Critical care time (minutes):  45   Critical care time was exclusive of:  Separately billable procedures and treating other patients   Critical care was necessary to treat or prevent imminent or life-threatening deterioration of the following conditions: severe anemia.   Critical care was time spent personally by me on the following activities:  Development of treatment plan with patient or surrogate, discussions with consultants, evaluation of patient's response to treatment, examination of patient, obtaining history from patient or surrogate, review of old charts, re-evaluation of patient's condition, pulse oximetry, ordering and review of radiographic studies, ordering and review of laboratory studies and ordering and performing treatments and interventions   I assumed direction of critical care for this patient from another provider in my specialty: no  Care discussed with: admitting provider       Medications Ordered in ED Medications  pantoprozole (PROTONIX) 80 mg /NS 100  mL infusion (8 mg/hr Intravenous New Bag/Given 10/26/22 1304)  pantoprazole (PROTONIX) injection 40 mg (has no administration in time range)  0.9 %  sodium chloride infusion (Manually program via Guardrails IV Fluids) (has no administration in time range)  sodium chloride 0.9 % bolus 1,000 mL (1,000 mLs Intravenous New Bag/Given 10/26/22 1255)  famotidine (PEPCID) IVPB 20 mg premix (0 mg Intravenous Stopped 10/26/22 1340)  pantoprazole (PROTONIX) 80 mg /NS 100 mL IVPB (0 mg Intravenous Stopped 10/26/22 1325)    ED Course/ Medical Decision Making/ A&P                             Medical Decision Making Amount and/or Complexity of Data Reviewed Labs: ordered. Radiology: ordered.  Risk Prescription drug management. Decision regarding hospitalization.   This patient presents to the ED for concern of severe anemia, she is symptomatic with hypotension, lightheadedness, dizziness and generalized weakness, this involves an extensive number of treatment options, and is a complaint that carries with it a high risk of complications and morbidity.  The differential diagnosis includes upper GI bleed, lower GI bleed, hemolytic anemia   Co morbidities that complicate the patient evaluation  Multiple anticoagulants, history of ulcer disease   Additional history obtained:  Additional history obtained from family and electronic medical record External records from outside source obtained and reviewed including prior labs, she has dropped approximately 7 g a hemoglobin rather acutely   Lab Tests:  I Ordered, and personally interpreted labs.  The pertinent results include: Severe anemia hemoglobin of 5, platelets are up, Rauls blood cell count is up, metabolic panel reassuring, BUN normal creatinine normal hypokalemic   Imaging Studies ordered:  I ordered imaging studies including chest x-ray I independently visualized and interpreted imaging which showed no acute findings I agree with the  radiologist interpretation   Cardiac Monitoring: / EKG:  The patient was maintained on a cardiac monitor.  I personally viewed and interpreted the cardiac monitored which showed an underlying rhythm of: Sinus tachycardia   Consultations Obtained:  I requested consultation with the gastroenterology Dr. Jenetta Downer,  and discussed lab and imaging findings as well as pertinent plan - they recommend: Protonix with the Protonix drip, IV fluids and blood resuscitation, will discuss with hospitalist for admission   Problem List / ED Course / Critical interventions / Medication management  The patient is critically ill with severe anemia, she is requiring fluid and blood resuscitation but unfortunately has antibodies in her blood which will require special treatment of blood prior to transfusion.  GI is aware, they will see the patient consultation, will consult with hospitalist for admission I have reviewed the patients home medicines and have made adjustments as needed   Social Determinants of Health:  Severe anemia, continues to smoke   Test / Admission - Considered:  Will need to be admitted to higher level of care         Final Clinical Impression(s) / ED Diagnoses Final diagnoses:  Severe anemia  Upper GI bleed  Leukocytosis, unspecified type     Noemi Chapel, MD 10/26/22 1419

## 2022-10-26 NOTE — Telephone Encounter (Signed)
Spoke to pt's husband [name], asked if pt is there? He said she is laying down. Told him I need her to go to the ED now due to Hgb is critically low at 5.3 and Schippers count and hematocrit is abnormal also. [name] verbalized understanding and was waking her up as we spoke and said they will go right now. Told him okay.

## 2022-10-26 NOTE — Telephone Encounter (Signed)
CRITICAL VALUE STICKER  CRITICAL VALUE: [place] count: 22.1, Hgb: 5.3, Hematocrit: 17.6  RECEIVER (on-site recipient of call): Alivia  DATE & TIME NOTIFIED: 10/26/2022 at 10:33  MESSENGER (representative from lab): [facility] Santiago Glad  MD NOTIFIED: Dr. Jerline Pain  TIME OF NOTIFICATION: 10:35  RESPONSE: Dr. Jerline Pain said pt needs to go to the ED.

## 2022-10-26 NOTE — ED Triage Notes (Signed)
Pt had blood work done yesterday and doctor sent here due to low hemoglobin.  +SOB x 1 week, + lightheaded x 1 week.  Pt with hx of low iron and has had to get iron infusions and vitamin B12 injections as well. +black stools

## 2022-10-26 NOTE — Consult Note (Signed)
Gastroenterology Consult   Referring Provider: No ref. provider found Primary Care Physician:  Inda Coke, [place] Primary Gastroenterologist:  Elon Alas. Abbey Chatters, DO [name]; [id number]; 05-May-1956   Admit date: 10/26/2022  LOS: 0 days   Date of Consultation: 10/26/2022  Reason for Consultation:  anemia, melena, IDA    History of Present Illness   Tammy Mcpherson is a 67 y.o. female with COPD, HTN, PAD last stenting in 05/2021 and toe amputations, DM, B12 def, IDA requiring iron infusions (last infusions in 08/2022), IBS, gastric and duodenal AVMs status post ablation (2018), nonischemic cardiomyopathy presented to the ED for generalized weakness, due to low Hgb. She is on ASA and Plavix due to PAD.   Labs from yesterday: [place] blood cell count 22,100 from baseline of 15,100 in October, hemoglobin 5.3 down from 12.2 in October, MCV 82.1, platelets 634,000, sodium 136, BUN 13, creatinine 0.88, total bilirubin 0.2, alkaline phosphatase 95, AST 8, ALT 7, albumin 3.7.  In the ED: Initial blood pressure 94/40 and pulse of 111. WBC 19800, Hgb 5.4, Platelets 639000, Potassium 3, Na 134, cre 0.98, BUN 11.  Today: patient has been notices increased leg pain for few weeks. She has been worried about poor blood flow given her history of PAD and amputations of numerous toes. She went to establish care with new PCP and completed blood work which showed significant change in her Hgb as outlined. Patient states she has had some dark stools intermittently for few weeks, last time about two days ago. No brbpr. BMs regular. No abdominal pain. No heartburn. No n/v. Feeling weak over the past few weeks. Takes ASA and Plavix daily but no other NSAIDs.  EGD 08/2017: -erosive gastritis -single non-bleeding angioectasia in stomach s/p APC -non-bleeding erosive gastropathy -four non-bleeding angioectasias in duodenum s/p APC  Colonoscopy 07/2016: Non-thrombosed external  hemorrhoids -redundant left colon -diverticulosis -non-bleeding internal hemorrhoids -colonoscopy in 10 years   Prior to Admission medications   Medication Sig Start Date End Date Taking? Authorizing Provider  acetaminophen (TYLENOL) 500 MG tablet Take 500 mg by mouth at bedtime as needed for moderate pain.    [provider]  albuterol (PROVENTIL) (2.5 MG/3ML) 0.083% nebulizer solution Take 3 mLs (2.5 mg total) by nebulization every 4 (four) hours as needed for wheezing or shortness of breath. 04/26/22   [name], NP  albuterol (VENTOLIN HFA) 108 (90 Base) MCG/ACT inhaler INHALE TWO PUFFS INTO THE LUNGS EVERY SIX HOURS AS NEEDED FOR WHEEZING OR SHORTNESS OF BREATH 04/26/22   [name], NP  ALPRAZolam Duanne Moron) 0.5 MG tablet TAKE ONE TABLET (0.'5MG'$  TOTAL) BY MOUTH DAILY AS NEEDED FOR ANXIETY 04/25/22   [name], NP  aspirin 81 MG chewable tablet Chew 81 mg by mouth every morning.     [provider]  atorvastatin (LIPITOR) 80 MG tablet Take 1 tablet (80 mg total) by mouth daily. 06/07/22   Satira Sark, MD  bisoprolol (ZEBETA) 10 MG tablet TAKE ONE (1) TABLET BY MOUTH EVERY DAY 04/25/22   [name], NP  blood glucose meter kit and supplies Dispense based on patient and insurance preference. Use up to four times daily as directed. (FOR ICD-10 E10.9, E11.9). 11/22/20   [name], NP  Budeson-Glycopyrrol-Formoterol (BREZTRI AEROSPHERE) 160-9-4.8 MCG/ACT AERO Inhale 2 puffs into the lungs 2 (two) times daily. 11/15/20   Garner Nash, DO  Cholecalciferol (VITAMIN D) 50 MCG (2000 UT) CAPS Take 1 capsule (2,000 Units total) by mouth daily.  03/15/22   Derek Jack, MD  clopidogrel (PLAVIX) 75 MG tablet TAKE ONE TABLET ('75MG'$  TOTAL) BY MOUTH DAILY WITH BREAKFAST 04/26/22   [name], NP  Continuous Blood Gluc Receiver (FREESTYLE LIBRE 2 READER) DEVI Check blood glucose up to 4 times daily 10/11/22   Inda Coke, PA  Continuous Blood Gluc  Sensor (FREESTYLE LIBRE 2 SENSOR) MISC Apply one sensor to the upper arm avery 14 days. 04/26/22   [name], NP  cyanocobalamin (VITAMIN B12) 1000 MCG/ML injection INJECT 1 ML INTO THE MUSCLE EVERY 30 DAYS. 08/09/22   Derek Jack, MD  cyclobenzaprine (FLEXERIL) 5 MG tablet Take 1 tablet (5 mg total) by mouth 3 (three) times daily as needed for muscle spasms. 04/25/22   [name], NP  dicyclomine (BENTYL) 10 MG capsule Take 10 mg by mouth as needed for spasms.    [provider]  FLUoxetine (PROZAC) 40 MG capsule Take 1 capsule (40 mg total) by mouth daily. 04/26/22   [name], NP  gabapentin (NEURONTIN) 300 MG capsule TAKE ONE CAPSULE (300 MG TOTAL) BY MOUTHTWO TIMES DAILY. 04/26/22   [name], NP  glucose blood [facility] ULTRA) test strip Use as instructed 02/26/19   Letta Median K, DO  glucose monitoring kit (FREESTYLE) monitoring kit Use to check blood sugar BID as directed 01/24/19   Cirigliano, Stanton Kidney K, DO  lactase (LACTAID) 3000 units tablet Take by mouth as needed.    [provider]  Lancets [facility] DELICA PLUS [id number]) [place] 1 each by Does not apply route 2 (two) times a day. 02/26/19   Cirigliano, Garvin Fila, DO  metFORMIN (GLUCOPHAGE-XR) 500 MG 24 hr tablet Take 1 tablet (500 mg total) by mouth daily with breakfast. 10/25/22   Inda Coke, PA  Misc. Devices MISC Please provide supplies (needle, syringe, alcohol swabs) needed for patient to self-administer B-12 injections monthly. 01/07/20   Derek Jack, MD  pantoprazole (PROTONIX) 40 MG tablet TAKE ONE (1) TABLET 30 MINS BEFORE YOUR FIRST MEAL. 04/26/22   [name], NP  sacubitril-valsartan (ENTRESTO) 49-51 MG Take 1 tablet by mouth 2 (two) times daily. 06/07/22   Satira Sark, MD  spironolactone (ALDACTONE) 50 MG tablet Take 1 tablet (50 mg total) by mouth daily. 12/07/20   Cassandria Anger, MD  traMADol (ULTRAM) 50 MG tablet Take 1 tablet (50 mg total) by mouth every  12 (twelve) hours as needed for severe pain. 10/25/22   Inda Coke, PA  Tuberculin-Allergy Syringes Flossie Buffy TUBERCULIN SAFETY SYR) 25G X 1" 1 ML MISC USE TO ADMINISTER INJECTABLE VITAMIN B12. 08/09/22   Derek Jack, MD    Current Facility-Administered Medications  Medication Dose Route Frequency Provider Last Rate Last Admin   famotidine (PEPCID) IVPB 20 mg premix  20 mg Intravenous Rennis Chris, MD 100 mL/hr at 10/26/22 1310 20 mg at 10/26/22 1310   pantoprazole (PROTONIX) 80 mg /NS 100 mL IVPB  80 mg Intravenous Once Noemi Chapel, MD 300 mL/hr at 10/26/22 1305 80 mg at 10/26/22 1305   [START ON 10/30/2022] pantoprazole (PROTONIX) injection 40 mg  40 mg Intravenous Q12H Noemi Chapel, MD       pantoprozole (PROTONIX) 80 mg /NS 100 mL infusion  8 mg/hr Intravenous Continuous Noemi Chapel, MD 10 mL/hr at 10/26/22 1304 8 mg/hr at 10/26/22 1304   Current Outpatient Medications  Medication Sig Dispense Refill   acetaminophen (TYLENOL) 500 MG tablet Take 500 mg by mouth at bedtime as needed for moderate pain.     albuterol (  PROVENTIL) (2.5 MG/3ML) 0.083% nebulizer solution Take 3 mLs (2.5 mg total) by nebulization every 4 (four) hours as needed for wheezing or shortness of breath. 75 mL 5   albuterol (VENTOLIN HFA) 108 (90 Base) MCG/ACT inhaler INHALE TWO PUFFS INTO THE LUNGS EVERY SIX HOURS AS NEEDED FOR WHEEZING OR SHORTNESS OF BREATH 8.5 g 5   ALPRAZolam (XANAX) 0.5 MG tablet TAKE ONE TABLET (0.'5MG'$  TOTAL) BY MOUTH DAILY AS NEEDED FOR ANXIETY 30 tablet 2   aspirin 81 MG chewable tablet Chew 81 mg by mouth every morning.      atorvastatin (LIPITOR) 80 MG tablet Take 1 tablet (80 mg total) by mouth daily. 90 tablet 3   bisoprolol (ZEBETA) 10 MG tablet TAKE ONE (1) TABLET BY MOUTH EVERY DAY 90 tablet 1   blood glucose meter kit and supplies Dispense based on patient and insurance preference. Use up to four times daily as directed. (FOR ICD-10 E10.9, E11.9). 1 each 0    Budeson-Glycopyrrol-Formoterol (BREZTRI AEROSPHERE) 160-9-4.8 MCG/ACT AERO Inhale 2 puffs into the lungs 2 (two) times daily. 11.8 g 12   Cholecalciferol (VITAMIN D) 50 MCG (2000 UT) CAPS Take 1 capsule (2,000 Units total) by mouth daily. 90 capsule 4   clopidogrel (PLAVIX) 75 MG tablet TAKE ONE TABLET ('75MG'$  TOTAL) BY MOUTH DAILY WITH BREAKFAST 90 tablet 1   Continuous Blood Gluc Receiver (FREESTYLE LIBRE 2 READER) DEVI Check blood glucose up to 4 times daily 1 each 0   Continuous Blood Gluc Sensor (FREESTYLE LIBRE 2 SENSOR) MISC Apply one sensor to the upper arm avery 14 days. 6 each 1   cyanocobalamin (VITAMIN B12) 1000 MCG/ML injection INJECT 1 ML INTO THE MUSCLE EVERY 30 DAYS. 1 mL 11   cyclobenzaprine (FLEXERIL) 5 MG tablet Take 1 tablet (5 mg total) by mouth 3 (three) times daily as needed for muscle spasms. 45 tablet 3   dicyclomine (BENTYL) 10 MG capsule Take 10 mg by mouth as needed for spasms.     FLUoxetine (PROZAC) 40 MG capsule Take 1 capsule (40 mg total) by mouth daily. 90 capsule 3   gabapentin (NEURONTIN) 300 MG capsule TAKE ONE CAPSULE (300 MG TOTAL) BY MOUTHTWO TIMES DAILY. 180 capsule 1   glucose blood (ONETOUCH ULTRA) test strip Use as instructed 100 each 12   glucose monitoring kit (FREESTYLE) monitoring kit Use to check blood sugar BID as directed 1 each 0   lactase (LACTAID) 3000 units tablet Take by mouth as needed.     Lancets (ONETOUCH DELICA PLUS [id number]) MISC 1 each by Does not apply route 2 (two) times a day. 100 each 3   metFORMIN (GLUCOPHAGE-XR) 500 MG 24 hr tablet Take 1 tablet (500 mg total) by mouth daily with breakfast. 90 tablet 1   Misc. Devices MISC Please provide supplies (needle, syringe, alcohol swabs) needed for patient to self-administer B-12 injections monthly. 1 each 12   pantoprazole (PROTONIX) 40 MG tablet TAKE ONE (1) TABLET 30 MINS BEFORE YOUR FIRST MEAL. 90 tablet 3   sacubitril-valsartan (ENTRESTO) 49-51 MG Take 1 tablet by mouth 2 (two) times  daily. 60 tablet 6   spironolactone (ALDACTONE) 50 MG tablet Take 1 tablet (50 mg total) by mouth daily. 90 tablet 1   traMADol (ULTRAM) 50 MG tablet Take 1 tablet (50 mg total) by mouth every 12 (twelve) hours as needed for severe pain. 30 tablet 0   Tuberculin-Allergy Syringes (ULTICARE TUBERCULIN SAFETY SYR) 25G X 1" 1 ML MISC USE TO ADMINISTER INJECTABLE VITAMIN B12.  1 each 11    Allergies as of 10/26/2022 - Review Complete 10/26/2022  Allergen Reaction Noted   Bee venom Swelling 05/19/2016   Codeine Anaphylaxis 04/30/2016   Penicillins Swelling and Rash 03/07/2012   Bupropion Other (See Comments) 10/04/2017   Sudafed [pseudoephedrine hcl] Rash 06/27/2013    Past Medical History:  Diagnosis Date   Anemia 04/30/2016   Anxiety    Arthritis    Cardiomyopathy ([place])    a. EF 40-45% by echo in 04/2016 b. Improved to 60-65% by repeat imaging in 2018   COPD (chronic obstructive pulmonary disease) (HCC)    Coronary artery calcification seen on CT scan    Essential hypertension    GERD (gastroesophageal reflux disease)    Headache    History of bronchitis    History of GI bleed    History of hiatal hernia    Hyperlipidemia    Iron deficiency anemia    Peripheral vascular disease (HCC)    Pollen allergy    PSVT (paroxysmal supraventricular tachycardia)    PVC's (premature ventricular contractions)    Type 2 diabetes mellitus ([place])    Vitamin B12 deficiency     Past Surgical History:  Procedure Laterality Date   ABDOMINAL AORTOGRAM W/LOWER EXTREMITY Bilateral 05/19/2021   Procedure: ABDOMINAL AORTOGRAM W/LOWER EXTREMITY;  Surgeon: [name], MD;  Location: [facility];  Service: Cardiovascular;  Laterality: Bilateral;   ABDOMINAL HYSTERECTOMY     polyps  about age 63   AIKEN OSTEOTOMY Right 07/10/2013   Procedure: Barbie Banner OSTEOTOMY RIGHT FOOT;  Surgeon: Marcheta Grammes, DPM;  Location: [facility];  Service: Orthopedics;  Laterality: Right;   AMPUTATION Left  05/09/2017   Procedure: AMPUTATION 5TH TOE LEFT FOOT;  Surgeon: [name], DPM;  Location: [facility];  Service: Podiatry;  Laterality: Left;   AMPUTATION Left 06/13/2017   Procedure: AMPUTATION 2ND TOE LEFT FOOT;  Surgeon: [name], DPM;  Location: [facility];  Service: Podiatry;  Laterality: Left;   AMPUTATION Right 06/01/2021   Procedure: Right great toe and Second Toe Amputation;  Surgeon: [name], MD;  Location: [facility];  Service: Vascular;  Laterality: Right;   BUNIONECTOMY Right 07/10/2013   Procedure: Prudy Feeler BUNIONECTOMY RIGHT FOOT;  Surgeon: Marcheta Grammes, DPM;  Location: [facility];  Service: Orthopedics;  Laterality: Right;   CHOLECYSTECTOMY N/A 08/22/2017   Procedure: LAPAROSCOPIC CHOLECYSTECTOMY;  Surgeon: Virl Cagey, MD;  Location: [facility];  Service: General;  Laterality: N/A;   COLONOSCOPY N/A 07/07/2016   Dr. Oneida Alar; redundant left colon, diverticulosis at hepatic flexure, non-bleeding internal hemorrhoids   ESOPHAGOGASTRODUODENOSCOPY N/A 07/07/2016   Dr. Oneida Alar: many non-bleeding cratered gastric ulcers without stigmata of bleeding in gastric antrum. four 2-3 mm angioectasias without bleeding in duodenal bulb and second portion of duodenum s/p APC. Chroni gastritis on path.    ESOPHAGOGASTRODUODENOSCOPY N/A 08/03/2017   Dr. Oneida Alar: erosive gastritis, AVMs. Found a single non-bleeding angioectasia in stomach, s/p APC therapy. Four non-bleeding angioectasias in duodenum s/p APC. Non-bleeding erosive gastropathy   IR ANGIOGRAM EXTREMITY LEFT  04/24/2017   IR FEM POP ART ATHERECT INC PTA MOD SED  04/24/2017   IR INFUSION THROMBOL ARTERIAL INITIAL (MS)  04/24/2017   IR RADIOLOGIST EVAL & MGMT  12/05/2016   IR US GUIDE VASC ACCESS RIGHT  04/24/2017   LIVER BIOPSY N/A 08/22/2017   Procedure: LIVER BIOPSY;  Surgeon: Virl Cagey, MD;  Location: [facility];  Service: General;  Laterality: N/A;   METATARSAL HEAD  EXCISION Right 07/10/2013   Procedure: METATARSAL HEAD  RESECTION OF DIGITS 2 AND 3 RIGHT FOOT;  Surgeon: Marcheta Grammes, DPM;  Location: [facility];  Service: Orthopedics;  Laterality: Right;   PERIPHERAL VASCULAR INTERVENTION Right 05/19/2021   Procedure: PERIPHERAL VASCULAR INTERVENTION;  Surgeon: [name], MD;  Location: [facility];  Service: Cardiovascular;  Laterality: Right;   PROXIMAL INTERPHALANGEAL FUSION (PIP) Right 07/10/2013   Procedure: ARTHRODESIS PIPJ  2ND DIGIT RIGHT FOOT;  Surgeon: Marcheta Grammes, DPM;  Location: [facility];  Service: Orthopedics;  Laterality: Right;    Family History  Adopted: Yes  Problem Relation Age of Onset   Hypertension Father    Coronary artery disease Sister    Ulcers Maternal Grandmother    Colon cancer Neg Hx        unknown, was adopted    Social History   Socioeconomic History   Marital status: Married    Spouse name: [name]   Number of children: 1   Years of education: 12   Highest education level: Not on file  Occupational History   Occupation: disabled    Comment: heart  Tobacco Use   Smoking status: Light Smoker    Packs/day: 0.50    Years: 44.00    Total pack years: 22.00    Types: Cigarettes    Start date: 05/10/1975    Passive exposure: Never   Smokeless tobacco: Never   Tobacco comments:    2 cigarettes per day 12/10/2019  Vaping Use   Vaping Use: Former  Substance and Sexual Activity   Alcohol use: Not Currently    Alcohol/week: 2.0 standard drinks of alcohol    Types: 2 Glasses of wine per week   Drug use: No   Sexual activity: Yes    Birth control/protection: Surgical  Other Topics Concern   Not on file  Social History Narrative   Disabled   Lives with husband [name]   Two dogs   Social Determinants of Health   Financial Resource Strain: Not on file  Food Insecurity: Not on file  Transportation Needs: Not on file  Physical Activity: Not on file  Stress: Not on file  Social Connections: Not on file  Intimate Partner Violence: Not At Risk  (10/05/2020)   Humiliation, Afraid, Rape, and Kick questionnaire    Fear of Current or Ex-Partner: No    Emotionally Abused: No    Physically Abused: No    Sexually Abused: No     Review of System:   General: Negative for anorexia, weight loss, fever, chills, fatigue, +weakness. Eyes: Negative for vision changes.  ENT: Negative for hoarseness, difficulty swallowing, nasal congestion. CV: Negative for chest pain, angina, palpitations, dyspnea on exertion, peripheral edema.  Respiratory: Negative for dyspnea at rest, dyspnea on exertion, cough, sputum, wheezing.  GI: See history of present illness. GU:  Negative for dysuria, hematuria, urinary incontinence, urinary frequency, nocturnal urination.  MS: Negative for joint pain, low back pain. Leg pain. Derm: Negative for rash or itching.  Neuro: Negative for weakness, abnormal sensation, seizure, frequent headaches, memory loss, confusion.  Psych: Negative for anxiety, depression, suicidal ideation, hallucinations.  Endo: Negative for unusual weight change.  Heme: Negative for bruising or bleeding. Allergy: Negative for rash or hives.      Physical Examination:   Vital signs in last 24 hours: Temp:  [98.3 F (36.8 C)] 98.3 F (36.8 C) (01/25 1232) Pulse Rate:  [101-117] 101 (01/25 1300) Resp:  [14-24] 24 (01/25  1300) BP: (94-120)/(40-46) 120/46 (01/25 1300) SpO2:  [85 %-96 %] 85 % (01/25 1300) Weight:  [60.3 kg] 60.3 kg (01/25 1232)    General: Well-nourished, well-developed in no acute distress.  Head: Normocephalic, atraumatic.   Eyes: Conjunctiva pink, no icterus. Mouth: Oropharyngeal mucosa moist and pink , no lesions erythema or exudate. Neck: Supple without thyromegaly, masses, or lymphadenopathy.  Lungs: Clear to auscultation bilaterally.  Heart: Regular rate and rhythm, no murmurs rubs or gallops.  Abdomen: Bowel sounds are normal, nontender, nondistended, no hepatosplenomegaly or masses, no abdominal bruits or hernia ,  no rebound or guarding.   Rectal: not performed Extremities: No lower extremity edema.  Neuro: Alert and oriented x 4 , grossly normal neurologically.  Skin: Warm and dry, no rash or jaundice.   Psych: Alert and cooperative, normal mood and affect.        Intake/Output from previous day: No intake/output data recorded. Intake/Output this shift: No intake/output data recorded.  Lab Results:   CBC Recent Labs    10/25/22 1400 10/26/22 1250  WBC 22.1 Repeated and verified X2.* 19.8*  HGB 5.3 Repeated and verified X2.* 5.4*  HCT 17.6 Repeated and verified X2.* 18.8*  MCV 82.1 84.3  PLT 634.0* 639*   BMET Recent Labs    10/25/22 1400  NA 136  K 4.0  CL 99  CO2 24  GLUCOSE 194*  BUN 13  CREATININE 0.88  CALCIUM 8.8   LFT Recent Labs    10/25/22 1400  BILITOT 0.2  ALKPHOS 95  AST 8  ALT 7  PROT 6.5  ALBUMIN 3.7    Lipase No results for input(s): "LIPASE" in the last 72 hours.  PT/INR No results for input(s): "LABPROT", "INR" in the last 72 hours.   Hepatitis Panel No results for input(s): "HEPBSAG", "HCVAB", "HEPAIGM", "HEPBIGM" in the last 72 hours.   Imaging Studies:   No results found.Minnie.Brome week]  Assessment:   67 year old female with COPD, hypertension, PAD s/p stenting (05/2021) and toe amputations, DM, B12 def, IDA requiring iron infusions (last infusions in 08/2022), IBS, gastric and duodenal AVMs status post ablation (2018), nonischemic cardiomyopathy presented to the ED for generalized weakness, low Hgb. Reported dark stools. She is on ASA and Plavix due to PAD.   Anemia: reported dark stools intermittently for 2-3 weeks. Last episode two days ago. Hgb normal in 07/2022. Yesterday Hgb 5.3. history of gastric/duodenal AVMs last treated in 2018. Following chronically with hematology and receiving intermittent iron infusions, last time in 08/2022. Suspect UGI bleeding possibly due to AVMs.   Plan:   Transfusion as planned. Goal of Hgb above 7.  PPI  infusion.  Recommend EGD/enteroscopy this admission. Anticipate tomorrow. I have discussed the risks, alternatives, benefits with regards to but not limited to the risk of reaction to medication, bleeding, infection, perforation and the patient is agreeable to proceed. Written consent to be obtained.    LOS: 0 days   We would like to thank you for the opportunity to participate in the care of LIANETTE BROUSSARD.  Laureen Ochs. Bernarda Caffey [facility] [phone] 1/25/20241:16 PM

## 2022-10-27 ENCOUNTER — Inpatient Hospital Stay (HOSPITAL_COMMUNITY): Payer: PPO | Admitting: Anesthesiology

## 2022-10-27 ENCOUNTER — Encounter (HOSPITAL_COMMUNITY): Payer: Self-pay | Admitting: Internal Medicine

## 2022-10-27 ENCOUNTER — Encounter (HOSPITAL_COMMUNITY)
Admission: EM | Disposition: A | Discharge status: Home / Self Care | Payer: Self-pay | Source: Home / Self Care | Attending: Internal Medicine

## 2022-10-27 DIAGNOSIS — I2510 Atherosclerotic heart disease of native coronary artery without angina pectoris: Secondary | ICD-10-CM

## 2022-10-27 DIAGNOSIS — D509 Iron deficiency anemia, unspecified: Secondary | ICD-10-CM

## 2022-10-27 DIAGNOSIS — K31811 Angiodysplasia of stomach and duodenum with bleeding: Principal | ICD-10-CM

## 2022-10-27 DIAGNOSIS — F17210 Nicotine dependence, cigarettes, uncomplicated: Secondary | ICD-10-CM

## 2022-10-27 DIAGNOSIS — I10 Essential (primary) hypertension: Secondary | ICD-10-CM

## 2022-10-27 DIAGNOSIS — D62 Acute posthemorrhagic anemia: Secondary | ICD-10-CM | POA: Diagnosis not present

## 2022-10-27 DIAGNOSIS — K921 Melena: Secondary | ICD-10-CM

## 2022-10-27 HISTORY — PX: HOT HEMOSTASIS: SHX5433

## 2022-10-27 HISTORY — PX: ENTEROSCOPY: SHX5533

## 2022-10-27 HISTORY — PX: BIOPSY: SHX5522

## 2022-10-27 LAB — CBC
HCT: 28.4 % — ABNORMAL LOW (ref 36.0–46.0)
Hemoglobin: 8.9 g/dL — ABNORMAL LOW (ref 12.0–15.0)
MCH: 27.5 pg (ref 26.0–34.0)
MCHC: 31.3 g/dL (ref 30.0–36.0)
MCV: 87.7 fL (ref 80.0–100.0)
Platelets: 477 10*3/uL — ABNORMAL HIGH (ref 150–400)
RBC: 3.24 MIL/uL — ABNORMAL LOW (ref 3.87–5.11)
RDW: 17.2 % — ABNORMAL HIGH (ref 11.5–15.5)
WBC: 21.7 10*3/uL — ABNORMAL HIGH (ref 4.0–10.5)
nRBC: 0.1 % (ref 0.0–0.2)

## 2022-10-27 LAB — COMPREHENSIVE METABOLIC PANEL
ALT: 11 U/L (ref 0–44)
AST: 11 U/L — ABNORMAL LOW (ref 15–41)
Albumin: 2.9 g/dL — ABNORMAL LOW (ref 3.5–5.0)
Alkaline Phosphatase: 93 U/L (ref 38–126)
Anion gap: 7 (ref 5–15)
BUN: 8 mg/dL (ref 8–23)
CO2: 25 mmol/L (ref 22–32)
Calcium: 7.5 mg/dL — ABNORMAL LOW (ref 8.9–10.3)
Chloride: 106 mmol/L (ref 98–111)
Creatinine, Ser: 0.58 mg/dL (ref 0.44–1.00)
GFR, Estimated: >60 mL/min (ref >60)
Glucose, Bld: 135 mg/dL — ABNORMAL HIGH (ref 70–99)
Potassium: 4.1 mmol/L (ref 3.5–5.1)
Sodium: 138 mmol/L (ref 135–145)
Total Bilirubin: 0.6 mg/dL (ref 0.3–1.2)
Total Protein: 6.4 g/dL — ABNORMAL LOW (ref 6.5–8.1)

## 2022-10-27 LAB — GLUCOSE, CAPILLARY
Glucose-Capillary: 122 mg/dL — ABNORMAL HIGH (ref 70–99)
Glucose-Capillary: 128 mg/dL — ABNORMAL HIGH (ref 70–99)
Glucose-Capillary: 128 mg/dL — ABNORMAL HIGH (ref 70–99)
Glucose-Capillary: 136 mg/dL — ABNORMAL HIGH (ref 70–99)
Glucose-Capillary: 141 mg/dL — ABNORMAL HIGH (ref 70–99)
Glucose-Capillary: 205 mg/dL — ABNORMAL HIGH (ref 70–99)

## 2022-10-27 LAB — MAGNESIUM: Magnesium: 1.9 mg/dL (ref 1.7–2.4)

## 2022-10-27 LAB — HIV ANTIBODY (ROUTINE TESTING W REFLEX): HIV Screen 4th Generation wRfx: NONREACTIVE

## 2022-10-27 SURGERY — ENTEROSCOPY
Anesthesia: General

## 2022-10-27 MED ORDER — LACTATED RINGERS IV SOLN: INTRAVENOUS | Status: DC

## 2022-10-27 MED ORDER — LIDOCAINE VISCOUS HCL 2 % MT SOLN
15.0000 mL | Freq: Once | OROMUCOSAL | Status: AC
  Administered 2022-10-27: 15 mL via OROMUCOSAL

## 2022-10-27 MED ORDER — LIDOCAINE HCL (CARDIAC) PF 100 MG/5ML IV SOSY
PREFILLED_SYRINGE | INTRAVENOUS | Status: DC | PRN
  Administered 2022-10-27: 100 mg via INTRAVENOUS

## 2022-10-27 MED ORDER — LIDOCAINE VISCOUS HCL 2 % MT SOLN
OROMUCOSAL | Status: AC
  Filled 2022-10-27: qty 15

## 2022-10-27 MED ORDER — STERILE WATER FOR IRRIGATION IR SOLN
Status: DC | PRN
  Administered 2022-10-27: 60 mL
  Administered 2022-10-27: 120 mL

## 2022-10-27 MED ORDER — IPRATROPIUM-ALBUTEROL 0.5-2.5 (3) MG/3ML IN SOLN
3.0000 mL | Freq: Four times a day (QID) | RESPIRATORY_TRACT | Status: DC | PRN
  Administered 2022-10-27: 3 mL via RESPIRATORY_TRACT
  Filled 2022-10-27: qty 3

## 2022-10-27 MED ORDER — PROPOFOL 10 MG/ML IV BOLUS
INTRAVENOUS | Status: DC | PRN
  Administered 2022-10-27 (×8): 20 mg via INTRAVENOUS

## 2022-10-27 MED ORDER — GLUCAGON HCL RDNA (DIAGNOSTIC) 1 MG IJ SOLR
INTRAMUSCULAR | Status: DC | PRN
  Administered 2022-10-27: 1 mg via INTRAVENOUS

## 2022-10-27 NOTE — Transfer of Care (Signed)
Immediate Anesthesia Transfer of Care Note  Patient: Tammy Mcpherson  Procedure(s) Performed: ENTEROSCOPY BIOPSY HOT HEMOSTASIS (ARGON PLASMA COAGULATION/BICAP)  Patient Location: PACU  Anesthesia Type:General  Level of Consciousness: awake, alert , and patient cooperative  Airway & Oxygen Therapy: Patient Spontanous Breathing and Patient connected to nasal cannula oxygen  Post-op Assessment: Report given to RN, Post -op Vital signs reviewed and stable, and Patient moving all extremities X 4  Post vital signs: Reviewed and stable  Last Vitals:  Vitals Value Taken Time  BP 149/68 10/27/22 1147  Temp    Pulse 109 10/27/22 1149  Resp 19 10/27/22 1149  SpO2 86 % 10/27/22 1149  Vitals shown include unvalidated device data.  Last Pain:  Vitals:   10/27/22 1017  TempSrc: Oral  PainSc: 0-No pain         Complications: No notable events documented.

## 2022-10-27 NOTE — Progress Notes (Signed)
  Transition of Care [facility]) Screening Note   Patient Details  Name: Tammy Mcpherson Date of Birth: August 12, 1956   Transition of Care [facility]) CM/SW Contact:    Iona Beard, [place] Phone Number: 10/27/2022, 9:45 AM    Transition of Care Department [facility]) has reviewed patient and no TOC needs have been identified at this time. We will continue to monitor patient advancement through interdisciplinary progression rounds. If new patient transition needs arise, please place a TOC consult.

## 2022-10-27 NOTE — Progress Notes (Signed)
Patient was mews green of 0 and now she is a mews yellow of 3. BP is 175/62 (91) pulse 123, o2 is 93 on 4 L temp of 100.7. Resp 19. She is diaphoretic. This nurse called tele to see how long she has been in 120's of HR and they said since 1730. (No one called this nurse about it) Notified MD.

## 2022-10-27 NOTE — Op Note (Signed)
[facility] Patient Name: Tammy Mcpherson Procedure Date: 10/27/2022 10:53 AM MRN: [id number] Date of Birth: 22-Sep-1956 Attending MD: Norvel Richards , MD, [id number] CSN: [id number] Age: 67 Admit Type: Inpatient Procedure:                Small bowel enteroscopy /EGD with gastric biopsy Indications:              Iron deficiency anemia, Melena Providers:                Norvel Richards, MD, Crystal Page, Raphael Gibney, Technician Referring MD:              Medicines:                Propofol per Anesthesia Complications:            No immediate complications. Estimated Blood Loss:     Estimated blood loss was minimal. Procedure:                Pre-Anesthesia Assessment:                           - Prior to the procedure, a History and Physical                            was performed, and patient medications and                            allergies were reviewed. The patient's tolerance of                            previous anesthesia was also reviewed. The risks                            and benefits of the procedure and the sedation                            options and risks were discussed with the patient.                            All questions were answered, and informed consent                            was obtained. Prior Anticoagulants: The patient has                            taken no anticoagulant or antiplatelet agents. ASA                            Grade Assessment: III - A patient with severe                            systemic disease. After reviewing the risks and  benefits, the patient was deemed in satisfactory                            condition to undergo the procedure.                           After obtaining informed consent, the endoscope was                            passed under direct vision. Throughout the                            procedure, the patient's blood pressure, pulse, and                             oxygen saturations were monitored continuously. The                            [id number] ([id number]) was introduced through the                            mouth and advanced to the proximal jejunum. The                            small bowel enteroscopy was accomplished without                            difficulty. The patient tolerated the procedure                            well. Scope In: 11:08:30 AM Scope Out: 11:39:34 AM Total Procedure Duration: 0 hours 31 minutes 4 seconds  Findings:      Esophageal mucosa appeared normal. Stomach empty. Small hiatal hernia.       Diffusely reticulated mottled appearance of the gastric mucosa without       ulcer or infiltrating process. No blood in the stomach. Patent pylorus.      Pediatric colonoscope advanced into the proximal jejunum. Patient had       multiple 2 to 4 mm AVMs (20); 2 were actively oozing in the distal       duodenum. All identified AVMs were sealed with APC circular probe right       colon settings 20 J each. Please see photos.      Finally, gastric mucosal biopsies taken to assess for H. pylori. Impression:               - Normal esophagus. Small hiatal hernia. 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 The following parameters were monitored: oxygen       saturation, heart rate, blood pressure, respiratory rate, EKG, adequacy       of pulmonary ventilation, and response to care. Recommendation:           - Return patient to ICU for ongoing care. Clear                            liquid diet today. Trend H&H. If doing well in the                            morning  advance diet. Follow-up on pathology.                           -At patient request, I called the Tammy Mcpherson, husband,                            [phone] - discussed findings and                            recommendations in detail Procedure Code(s):        --- Professional ---                           [phone], Small intestinal endoscopy, enteroscopy                            beyond second portion of duodenum, not including                            ileum; diagnostic, including collection of                            specimen(s) by brushing or washing, when performed                            (separate procedure) Diagnosis Code(s):        --- Professional ---                           D50.9, Iron deficiency anemia, unspecified                           K92.1, Melena (includes Hematochezia) CPT copyright 2022 American [employer] Association. All rights reserved. The codes documented in this report are preliminary and upon coder review may  be revised to meet current compliance requirements. Cristopher Estimable. Elijha Dedman, MD Norvel Richards, MD 10/27/2022 11:56:03 AM This report has been signed electronically. Number of Addenda: 0

## 2022-10-27 NOTE — Care Management Important Message (Signed)
Important Message  Patient Details  Name: Tammy Mcpherson MRN: [id number] Date of Birth: December 05, 1955   Medicare Important Message Given:  Yes (copy left at bedside table, patient not in room)     Tommy Medal 10/27/2022, 11:38 AM

## 2022-10-27 NOTE — Progress Notes (Signed)
PROGRESS NOTE    Tammy Mcpherson  GGY:[id number] DOB: [birth year] DOA: 10/26/2022 PCP: Inda Coke, PA   Brief Narrative:    Tammy Mcpherson is a 67 y.o. female with medical history significant for COPD with chronic tobacco abuse, hypertension, PAD with prior stents and toe amputations, type 2 diabetes, iron deficiency anemia, prior findings of erosive gastritis as well as diverticulosis and internal hemorrhoids who presented to the ED with generalized weakness and noted low hemoglobin levels from labs drawn in her PCP office on 1/24.  She was admitted for acute blood loss anemia and underwent 2 unit PRBC transfusion with stable vital signs noted.  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  Assessment & Plan:   Principal Problem:   Acute blood loss anemia Active Problems:   Essential hypertension   Tobacco abuse   Anemia, iron deficiency   Hyperlipidemia   Neuropathy due to type 2 diabetes mellitus (HCC)   Type 2 diabetes mellitus with hyperglycemia, without long-term current use of insulin (HCC)   Diabetes mellitus (HCC)   Lower limb ischemia   GERD (gastroesophageal reflux disease)   GAD (generalized anxiety disorder)   Peptic ulcer disease   Upper GI bleed   Severe anemia  Assessment and Plan:   Acute blood loss anemia secondary to bleeding AVMs Status post enteroscopy 1/26 with APC to 2 bleeding AVMs Continue clear liquid diet per GI recommendations and may advance tomorrow Status post 2 unit PRBC transfusion, monitor CBC Continue PPI infusion Appreciate ongoing GI recommendations   Type 2 diabetes with hyperglycemia Switch to before every meal/nightly Recent A1c 7.3%   Lower extremity claudication/PAD with prior toe amputations Continue gabapentin Hold aspirin and Plavix   COPD Continue home inhalers No acute bronchospasms noted   Dyslipidemia Continue Lipitor   Ongoing tobacco  abuse Counseled on cessation, but not interested in quitting    DVT prophylaxis: SCDs Code Status: Full Family Communication: Husband at bedside 1/26 Disposition Plan:  Status is: Inpatient Remains inpatient appropriate because: Need for IV medications.  Consultants:  GI  Procedures:  Enteroscopy with APC to 2 bleeding AVMs 1/26  Antimicrobials:  None   Subjective: Patient seen and evaluated today with complaints of lower extremity pain. No acute concerns or events noted overnight.  No further bowel movements noted.  She had received PRBC transfusion with improvement in hemoglobin levels overnight.  Vital signs remain stable.  Objective: Vitals:   10/27/22 1000 10/27/22 1017 10/27/22 1150 10/27/22 1200  BP: (!) 171/57 (!) 151/57 (!) 149/68 (!) 149/70  Pulse: (!) 107 (!) 105 (!) 108 (!) 109  Resp: '20 16 19 18  '$ Temp:  98.6 F (37 C) 97.9 F (36.6 C)   TempSrc:  Oral    SpO2: 92% 92% 90% 100%  Weight:  65.7 kg    Height:  '5\' 1"'$  (1.549 m)      Intake/Output Summary (Last 24 hours) at 10/27/2022 1252 Last data filed at 10/27/2022 1118 Gross per 24 hour  Intake 2539.08 ml  Output --  Net 2539.08 ml   Filed Weights   10/26/22 1554 10/27/22 0338 10/27/22 1017  Weight: 62.8 kg 65.7 kg 65.7 kg    Examination:  General exam: Appears calm and comfortable  Respiratory system: Clear to auscultation. Respiratory effort normal.  Nasal cannula oxygen Cardiovascular system: S1 & S2 heard, RRR.  Gastrointestinal system: Abdomen is soft Central nervous system: Alert and awake  Extremities: No edema Skin: No significant lesions noted Psychiatry: Flat affect.    Data Reviewed: I have personally reviewed following labs and imaging studies  CBC: Recent Labs  Lab 10/25/22 1400 10/26/22 1250 10/27/22 0709  WBC 22.1 Repeated and verified X2.* 19.8* 21.7*  NEUTROABS 18.3* 15.5*  --   HGB 5.3 Repeated and verified X2.* 5.4* 8.9*  HCT 17.6 Repeated and verified X2.* 18.8*  28.4*  MCV 82.1 84.3 87.7  PLT 634.0* 639* 778*   Basic Metabolic Panel: Recent Labs  Lab 10/25/22 1400 10/26/22 1250 10/27/22 0709  NA 136 134* 138  K 4.0 3.0* 4.1  CL 99 99 106  CO2 '24 23 25  '$ GLUCOSE 194* 197* 135*  BUN '13 11 8  '$ CREATININE 0.88 0.98 0.58  CALCIUM 8.8 8.5* 7.5*  MG  --   --  1.9   GFR: Estimated Creatinine Clearance: 60.9 mL/min (by C-G formula based on SCr of 0.58 mg/dL). Liver Function Tests: Recent Labs  Lab 10/25/22 1400 10/26/22 1250 10/27/22 0709  AST 8 15 11*  ALT '7 11 11  '$ ALKPHOS 95 97 93  BILITOT 0.2 0.4 0.6  PROT 6.5 6.9 6.4*  ALBUMIN 3.7 3.2* 2.9*   No results for input(s): "LIPASE", "AMYLASE" in the last 168 hours. No results for input(s): "AMMONIA" in the last 168 hours. Coagulation Profile: No results for input(s): "INR", "PROTIME" in the last 168 hours. Cardiac Enzymes: No results for input(s): "CKTOTAL", "CKMB", "CKMBINDEX", "TROPONINI" in the last 168 hours. BNP (last 3 results) No results for input(s): "PROBNP" in the last 8760 hours. HbA1C: Recent Labs    10/25/22 1400  HGBA1C 7.3*   CBG: Recent Labs  Lab 10/26/22 1941 10/26/22 2348 10/27/22 0334 10/27/22 0736 10/27/22 1200  GLUCAP 118* 128* 122* 136* 205*   Lipid Profile: Recent Labs    10/25/22 1400  CHOL 125  HDL 26.80*  TRIG 223.0*  CHOLHDL 5  LDLDIRECT 64.0   Thyroid Function Tests: No results for input(s): "TSH", "T4TOTAL", "FREET4", "T3FREE", "THYROIDAB" in the last 72 hours. Anemia Panel: Recent Labs    10/26/22 1250  VITAMINB12 469  FOLATE 7.7  FERRITIN 8*  TIBC 543*  IRON 12*  RETICCTPCT 3.0   Sepsis Labs: No results for input(s): "PROCALCITON", "LATICACIDVEN" in the last 168 hours.  Recent Results (from the past 240 hour(s))  MRSA Next Gen by PCR, Nasal     Status: None   Collection Time: 10/26/22  1:48 PM   Specimen: Nasal Mucosa; Nasal Swab  Result Value Ref Range Status   MRSA by PCR Next Gen NOT DETECTED NOT DETECTED Final     Comment: (NOTE) The GeneXpert MRSA Assay (FDA approved for NASAL specimens only), is one component of a comprehensive MRSA colonization surveillance program. It is not intended to diagnose MRSA infection nor to guide or monitor treatment for MRSA infections. Test performance is not FDA approved in patients less than 42 years old. Performed at [facility] [address], [place], [place] [address]          Radiology Studies: [facility] Chest [facility] 1 View  Result Date: 10/26/2022 CLINICAL DATA:  SOB EXAM: PORTABLE CHEST 1 VIEW COMPARISON:  10/24/20 CXR FINDINGS: No pleural effusion. No pneumothorax. Normal cardiac and mediastinal contours. Bibasilar atelectasis. Visualized upper abdomen is unremarkable. No displaced rib fractures. IMPRESSION: Bibasilar atelectasis. Electronically Signed   By: Marin Roberts M.D.   On: 10/26/2022 14:19        Scheduled Meds:  sodium chloride  Intravenous Once   Chlorhexidine Gluconate Cloth  6 each Topical Q0600   FLUoxetine  40 mg Oral Daily   gabapentin  300 mg Oral TID   insulin aspart  0-9 Units Subcutaneous Q4H   lidocaine       [START ON 10/30/2022] pantoprazole  40 mg Intravenous Q12H   Continuous Infusions:  pantoprazole 8 mg/hr (10/27/22 0819)     LOS: 1 day    Time spent: 35 minutes    Ashantae Pangallo Darleen Crocker, DO [facility]  If 7PM-7AM, please contact night-coverage [email or web address] 10/27/2022, 12:52 PM

## 2022-10-27 NOTE — Progress Notes (Signed)
   10/27/22 1855  Assess: MEWS Score  Temp (!) 100.7 F (38.2 C)  BP (!) 175/62  MAP (mmHg) 91  Pulse Rate (!) 123  Resp 18  SpO2 93 %  O2 Device Nasal Cannula  O2 Flow Rate (L/min) 4 L/min  Assess: MEWS Score  MEWS Temp 1  MEWS Systolic 0  MEWS Pulse 2  MEWS RR 0  MEWS LOC 0  MEWS Score 3  MEWS Score Color Yellow  Assess: if the MEWS score is Yellow or Red  Were vital signs taken at a resting state? Yes  Focused Assessment Change from prior assessment (see assessment flowsheet)  Does the patient meet 2 or more of the SIRS criteria? No  MEWS guidelines implemented *See Row Information* Yes  Treat  MEWS Interventions Administered scheduled meds/treatments  Pain Scale 0-10  Pain Score 5  Pain Type Acute pain  Pain Location Leg  Pain Orientation Right;Left  Take Vital Signs  Increase Vital Sign Frequency  Yellow: Q 2hr X 2 then Q 4hr X 2, if remains yellow, continue Q 4hrs  Escalate  MEWS: Escalate Yellow: discuss with charge nurse/RN and consider discussing with provider and RRT  Notify: Charge Nurse/RN  Date Charge Nurse/RN Notified 10/27/22  Time Charge Nurse/RN Notified 1900  Provider Notification  Provider Name/Title Dr. Manuella Ghazi, DO  Date Provider Notified 10/27/22  Time Provider Notified 1900  Method of Notification Page  Notification Reason Change in status  Provider response See new orders  Date of Provider Response 10/27/22  Time of Provider Response 1901  Assess: SIRS CRITERIA  SIRS Temperature  0  SIRS Pulse 1  SIRS Respirations  0  SIRS WBC 0  SIRS Score Sum  1

## 2022-10-27 NOTE — H&P (View-Only) (Signed)
Labs reviewed. Hgb improved to 8.9 post 2u PRBC. Yesterday blood was ordered to kee p2 units ahead due to known antibodies. Labs stable. Will proceed with EGD/enteroscopy today with Dr. Gala Romney.   Venetia Night, MSN, APRN, FNP-BC, AGACNP-BC [facility]

## 2022-10-27 NOTE — Anesthesia Preprocedure Evaluation (Signed)
Anesthesia Evaluation  Patient identified by MRN, date of birth, ID band Patient awake    Reviewed: Allergy & Precautions, H&P , NPO status , Patient's Chart, lab work & pertinent test results  Airway Mallampati: II  TM Distance: >3 FB Neck ROM: Full    Dental  (+) Edentulous Upper, Edentulous Lower   Pulmonary COPD, Current Smoker and Patient abstained from smoking. On oxygen 4 l/minute   Pulmonary exam normal breath sounds clear to auscultation       Cardiovascular Exercise Tolerance: Good hypertension, Pt. on medications + CAD and + Peripheral Vascular Disease  Normal cardiovascular exam Rhythm:Regular Rate:Normal  Satira Sark, MD 06/09/2022 12:57 PM EDT   Results reviewed.  Echocardiogram stable with normal LVEF 60 to 65%.  Continue with current plan.     Neuro/Psych  Headaches PSYCHIATRIC DISORDERS Anxiety        GI/Hepatic Neg liver ROS, hiatal hernia, PUD,GERD  Medicated,,Upper GI bleed   Endo/Other  diabetes, Well Controlled, Type 2, Oral Hypoglycemic Agents    Renal/GU negative Renal ROS  negative genitourinary   Musculoskeletal  (+) Arthritis , Osteoarthritis,    Abdominal   Peds negative pediatric ROS (+)  Hematology  (+) Blood dyscrasia, anemia   Anesthesia Other Findings   Reproductive/Obstetrics negative OB ROS                             Anesthesia Physical Anesthesia Plan  ASA: 4  Anesthesia Plan: General   Post-op Pain Management: Minimal or no pain anticipated   Induction: Intravenous  PONV Risk Score and Plan: Propofol infusion  Airway Management Planned: Nasal Cannula and Natural Airway  Additional Equipment:   Intra-op Plan:   Post-operative Plan:   Informed Consent: I have reviewed the patients History and Physical, chart, labs and discussed the procedure including the risks, benefits and alternatives for the proposed anesthesia with the  patient or authorized representative who has indicated his/her understanding and acceptance.       Plan Discussed with: CRNA and Surgeon  Anesthesia Plan Comments:         Anesthesia Quick Evaluation

## 2022-10-27 NOTE — Anesthesia Postprocedure Evaluation (Signed)
Anesthesia Post Note  Patient: Tammy Mcpherson  Procedure(s) Performed: ENTEROSCOPY BIOPSY HOT HEMOSTASIS (ARGON PLASMA COAGULATION/BICAP)  Patient location during evaluation: PACU Anesthesia Type: General Level of consciousness: awake and alert and oriented Pain management: pain level controlled Vital Signs Assessment: post-procedure vital signs reviewed and stable Respiratory status: spontaneous breathing, nonlabored ventilation, respiratory function stable and patient connected to nasal cannula oxygen Cardiovascular status: blood pressure returned to baseline and stable Postop Assessment: no apparent nausea or vomiting Anesthetic complications: no  No notable events documented.   Last Vitals:  Vitals:   10/27/22 1400 10/27/22 1531  BP: (!) 153/67   Pulse: (!) 106   Resp: 20   Temp: 37 C   SpO2: 93% 93%    Last Pain:  Vitals:   10/27/22 1400  TempSrc: Oral  PainSc:                  Tyn[place]lee Bowlds C Hoke Baer

## 2022-10-27 NOTE — Progress Notes (Addendum)
UNMATCHED BLOOD PRODUCT NOTE  Compare the patient ID on the blood tag to the patient ID on the hospital armband and Blood Bank armband. Then confirm the unit number on the blood tag matches the unit number on the blood product.  If a discrepancy is discovered return the product to blood bank immediately.   Blood Product Type: Packed Red Blood Cells  Unit #: (Found on blood product bag, begins with W) [id number]  Product Code #: (Found on blood product bag, begins with E) [id number]   Start Time: 0140  Starting Rate: 120 ml/hr  Rate increase/decreased  (if applicable):   034   ml/hr  Rate changed time (if applicable): 7425   Stop Time: 9563   All Other Documentation should be documented within the Blood Admin Flowsheet per policy.  Verified with Pryor Curia RN

## 2022-10-27 NOTE — Progress Notes (Signed)
Labs reviewed. Hgb improved to 8.9 post 2u PRBC. Yesterday blood was ordered to kee p2 units ahead due to known antibodies. Labs stable. Will proceed with EGD/enteroscopy today with Dr. Gala Romney.   Venetia Night, MSN, APRN, FNP-BC, AGACNP-BC [facility]

## 2022-10-27 NOTE — Interval H&P Note (Signed)
History and Physical Interval Note:  10/27/2022 10:56 AM  Suszanne Finch  has presented today for surgery, with the diagnosis of acute blood loss anemia, melena, history of iron deficiency anemia.  The various methods of treatment have been discussed with the patient and family. After consideration of risks, benefits and other options for treatment, the patient has consented to  Procedure(s): ESOPHAGOGASTRODUODENOSCOPY (EGD) WITH PROPOFOL (N/A) ENTEROSCOPY (N/A) as a surgical intervention.  The patient's history has been reviewed, patient examined, no change in status, stable for surgery.  I have reviewed the patient's chart and labs.  Questions were answered to the patient's satisfaction.     Manus Rudd   patient seen and examined in short stay.  Hemoglobin 8.9 this morning hemodynamically stable.  No overt GI bleeding overnight.  I noted a Mikulski count of nearly 22,000 and progressive leukocytosis over the past 3 months of uncertain significance.  I discussed with anesthesia.  Agree with need for EGD with possible enteroscopy with lesion ablation as appropriate.  I discussed this approach with the patient at length.  Risk, benefits, limitations, alternatives have been reviewed questions have been answered.  Patient is agreeable.  Further recommendations to follow.

## 2022-10-28 DIAGNOSIS — D62 Acute posthemorrhagic anemia: Secondary | ICD-10-CM | POA: Diagnosis not present

## 2022-10-28 LAB — CBC
HCT: 26.4 % — ABNORMAL LOW (ref 36.0–46.0)
Hemoglobin: 8.1 g/dL — ABNORMAL LOW (ref 12.0–15.0)
MCH: 27.1 pg (ref 26.0–34.0)
MCHC: 30.7 g/dL (ref 30.0–36.0)
MCV: 88.3 fL (ref 80.0–100.0)
Platelets: 429 10*3/uL — ABNORMAL HIGH (ref 150–400)
RBC: 2.99 MIL/uL — ABNORMAL LOW (ref 3.87–5.11)
RDW: 17.7 % — ABNORMAL HIGH (ref 11.5–15.5)
WBC: 21 10*3/uL — ABNORMAL HIGH (ref 4.0–10.5)
nRBC: 0.3 % — ABNORMAL HIGH (ref 0.0–0.2)

## 2022-10-28 LAB — BASIC METABOLIC PANEL
Anion gap: 8 (ref 5–15)
BUN: 7 mg/dL — ABNORMAL LOW (ref 8–23)
CO2: 26 mmol/L (ref 22–32)
Calcium: 7.7 mg/dL — ABNORMAL LOW (ref 8.9–10.3)
Chloride: 102 mmol/L (ref 98–111)
Creatinine, Ser: 0.54 mg/dL (ref 0.44–1.00)
GFR, Estimated: >60 mL/min (ref >60)
Glucose, Bld: 117 mg/dL — ABNORMAL HIGH (ref 70–99)
Potassium: 3.4 mmol/L — ABNORMAL LOW (ref 3.5–5.1)
Sodium: 136 mmol/L (ref 135–145)

## 2022-10-28 LAB — PROCALCITONIN: Procalcitonin: 0.83 ng/mL

## 2022-10-28 LAB — GLUCOSE, CAPILLARY
Glucose-Capillary: 110 mg/dL — ABNORMAL HIGH (ref 70–99)
Glucose-Capillary: 140 mg/dL — ABNORMAL HIGH (ref 70–99)
Glucose-Capillary: 142 mg/dL — ABNORMAL HIGH (ref 70–99)
Glucose-Capillary: 142 mg/dL — ABNORMAL HIGH (ref 70–99)
Glucose-Capillary: 150 mg/dL — ABNORMAL HIGH (ref 70–99)
Glucose-Capillary: 162 mg/dL — ABNORMAL HIGH (ref 70–99)
Glucose-Capillary: 174 mg/dL — ABNORMAL HIGH (ref 70–99)

## 2022-10-28 LAB — MAGNESIUM: Magnesium: 1.7 mg/dL (ref 1.7–2.4)

## 2022-10-28 MED ORDER — SODIUM CHLORIDE 0.9 % IV SOLN
2.0000 g | INTRAVENOUS | Status: DC
  Administered 2022-10-28 – 2022-10-29 (×2): 2 g via INTRAVENOUS
  Filled 2022-10-28 (×2): qty 20

## 2022-10-28 NOTE — Progress Notes (Signed)
No complaints other than being hungry.  She denies hematemesis or melena  Vital signs in last 24 hours: Temp:  [97.9 F (36.6 C)-100.7 F (38.2 C)] 98.4 F (36.9 C) (01/27 0529) Pulse Rate:  [105-123] 112 (01/27 0529) Resp:  [16-20] 20 (01/27 0529) BP: (143-175)/(51-70) 143/51 (01/27 0529) SpO2:  [90 %-100 %] 92 % (01/27 0529) Weight:  [65.7 kg] 65.7 kg (01/26 1017) Last BM Date : 10/24/22 (per patient) General:   Alert,  pleasant and cooperative in NAD;   accompanied by her husband, [name]. Abdomen:    Nondistended.  Soft and nontender Extremities:  Without clubbing or edema.    Intake/Output from previous day: 01/26 0701 - 01/27 0700 In: 1927.6 [P.O.:240; I.V.:1687.6] Out: 725 [Urine:725] Intake/Output this shift: No intake/output data recorded.  Lab Results: Recent Labs    10/26/22 1250 10/27/22 0709 10/28/22 0317  WBC 19.8* 21.7* 21.0*  HGB 5.4* 8.9* 8.1*  HCT 18.8* 28.4* 26.4*  PLT 639* 477* 429*   BMET Recent Labs    10/26/22 1250 10/27/22 0709 10/28/22 0317  NA 134* 138 136  K 3.0* 4.1 3.4*  CL 99 106 102  CO2 '23 25 26  '$ GLUCOSE 197* 135* 117*  BUN 11 8 7*  CREATININE 0.98 0.58 0.54  CALCIUM 8.5* 7.5* 7.7*   LFT Recent Labs    10/27/22 0709  PROT 6.4*  ALBUMIN 2.9*  AST 11*  ALT 11  ALKPHOS 93  BILITOT 0.6   PT/INR No results for input(s): "LABPROT", "INR" in the last 72 hours. Hepatitis Panel No results for input(s): "HEPBSAG", "HCVAB", "HEPAIGM", "HEPBIGM" in the last 72 hours. C-Diff No results for input(s): "CDIFFTOX" in the last 72 hours.  Studies/Results: DG Chest Port 1 View  Result Date: 10/26/2022 CLINICAL DATA:  SOB EXAM: PORTABLE CHEST 1 VIEW COMPARISON:  10/24/20 CXR FINDINGS: No pleural effusion. No pneumothorax. Normal cardiac and mediastinal contours. Bibasilar atelectasis. Visualized upper abdomen is unremarkable. No displaced rib fractures. IMPRESSION: Bibasilar atelectasis. Electronically Signed   By: Marin Roberts M.D.    On: 10/26/2022 14:19    Impression:   67 year old lady with multiple comorbidities including COPD and PAD admitted with profound anemia secondary to occult GI blood loss requiring transfusion.  EGD/enteropathy yesterday yielded 20 AVMs 2 of which were oozing.  They were all sealed with APC Inflamed appearing gastric mucosa for which gastric biopsies were taken and remain pending. She appears stable today without further evidence of bleeding.    Recommendations:   Continue once daily PPI indefinitely  Avoid all NSAIDs aside from low-dose aspirin  It appears that the benefits of DAPT still out weigh the risks at this time.  I recommend combination therapy with low-dose aspirin and clopidogrel resume 1/29.  Outpatient follow-up with Dr. Abbey Chatters.  I discussed my findings and recommendations at length with the patient and husband, [name], at the bedside.  From a GI standpoint, could probably be discharged within the next 24 hours

## 2022-10-28 NOTE — Progress Notes (Signed)
PROGRESS NOTE    Tammy Mcpherson  ZJI:[id number] DOB: 1956/02/20 DOA: 10/26/2022 PCP: Inda Coke, PA   Brief Narrative:    Tammy Mcpherson is a 67 y.o. female with medical history significant for COPD with chronic tobacco abuse, hypertension, PAD with prior stents and toe amputations, type 2 diabetes, iron deficiency anemia, prior findings of erosive gastritis as well as diverticulosis and internal hemorrhoids who presented to the ED with generalized weakness and noted low hemoglobin levels from labs drawn in her PCP office on 1/24.  She was admitted for acute blood loss anemia and underwent 2 unit PRBC transfusion with stable vital signs noted.  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 Diet advanced to soft today, but patient noted to have persistent leukocytosis with fever.  Assessment & Plan:   Principal Problem:   Acute blood loss anemia Active Problems:   Essential hypertension   Tobacco abuse   Anemia, iron deficiency   Hyperlipidemia   Neuropathy due to type 2 diabetes mellitus (HCC)   Type 2 diabetes mellitus with hyperglycemia, without long-term current use of insulin (HCC)   Diabetes mellitus (HCC)   Lower limb ischemia   GERD (gastroesophageal reflux disease)   GAD (generalized anxiety disorder)   Peptic ulcer disease   Upper GI bleed   Severe anemia  Assessment and Plan:   Acute blood loss anemia secondary to bleeding AVMs-stable Status post enteroscopy 1/26 with APC to 2 bleeding AVMs Advance to soft diet today Status post 2 unit PRBC transfusion, monitor CBC Continue PPI infusion and then transition to PPI twice daily and then plan to continue indefinitely Appreciate ongoing GI recommendations  Fever with worsening leukocytosis Check blood cultures Procalcitonin elevated Rocephin started empirically Monitor CBC and temperature overnight  Acute hypoxemic respiratory  failure Currently on 4 L nasal cannula oxygen Wean as tolerated   Type 2 diabetes with hyperglycemia Switch to before every meal/nightly Recent A1c 7.3%   Lower extremity claudication/PAD with prior toe amputations Continue gabapentin Hold aspirin and Plavix, plan to resume 1/29   COPD Continue home inhalers No acute bronchospasms noted   Dyslipidemia Continue Lipitor   Ongoing tobacco abuse Counseled on cessation, but not interested in quitting    DVT prophylaxis: SCDs Code Status: Full Family Communication: Husband at bedside 1/27 Disposition Plan:  Status is: Inpatient Remains inpatient appropriate because: Need for IV medications.  Consultants:  GI  Procedures:  Enteroscopy with APC to 2 bleeding AVMs 1/26  Antimicrobials:  None   Subjective: Patient seen and evaluated today with eagerness to discharge.  I have explained that she has had fever overnight and worsening leukocytosis with possibility of infection.  She also remains on nasal cannula oxygen.  Appears agreeable to staying at this time.  Objective: Vitals:   10/28/22 0145 10/28/22 0529 10/28/22 0906 10/28/22 1140  BP: (!) 156/63 (!) 143/51 (!) 150/51 (!) 153/62  Pulse: (!) 110 (!) 112 (!) 114 (!) 115  Resp: '20 20 20 20  '$ Temp: 98.6 F (37 C) 98.4 F (36.9 C) 98.3 F (36.8 C) 98.7 F (37.1 C)  TempSrc:   Oral Oral  SpO2: 93% 92% 91% 90%  Weight:      Height:        Intake/Output Summary (Last 24 hours) at 10/28/2022 1228 Last data filed at 10/28/2022 0900 Gross per 24 hour  Intake 622.33 ml  Output 500 ml  Net 122.33 ml  Filed Weights   10/26/22 1554 10/27/22 0338 10/27/22 1017  Weight: 62.8 kg 65.7 kg 65.7 kg    Examination:  General exam: Appears calm and comfortable  Respiratory system: Clear to auscultation. Respiratory effort normal.  Nasal cannula oxygen 4 L Cardiovascular system: S1 & S2 heard, RRR.  Gastrointestinal system: Abdomen is soft Central nervous system: Alert  and awake Extremities: No edema Skin: No significant lesions noted Psychiatry: Flat affect.    Data Reviewed: I have personally reviewed following labs and imaging studies  CBC: Recent Labs  Lab 10/25/22 1400 10/26/22 1250 10/27/22 0709 10/28/22 0317  WBC 22.1 Repeated and verified X2.* 19.8* 21.7* 21.0*  NEUTROABS 18.3* 15.5*  --   --   HGB 5.3 Repeated and verified X2.* 5.4* 8.9* 8.1*  HCT 17.6 Repeated and verified X2.* 18.8* 28.4* 26.4*  MCV 82.1 84.3 87.7 88.3  PLT 634.0* 639* 477* 409*   Basic Metabolic Panel: Recent Labs  Lab 10/25/22 1400 10/26/22 1250 10/27/22 0709 10/28/22 0317  NA 136 134* 138 136  K 4.0 3.0* 4.1 3.4*  CL 99 99 106 102  CO2 '24 23 25 26  '$ GLUCOSE 194* 197* 135* 117*  BUN '13 11 8 '$ 7*  CREATININE 0.88 0.98 0.58 0.54  CALCIUM 8.8 8.5* 7.5* 7.7*  MG  --   --  1.9 1.7   GFR: Estimated Creatinine Clearance: 60.9 mL/min (by C-G formula based on SCr of 0.54 mg/dL). Liver Function Tests: Recent Labs  Lab 10/25/22 1400 10/26/22 1250 10/27/22 0709  AST 8 15 11*  ALT '7 11 11  '$ ALKPHOS 95 97 93  BILITOT 0.2 0.4 0.6  PROT 6.5 6.9 6.4*  ALBUMIN 3.7 3.2* 2.9*   No results for input(s): "LIPASE", "AMYLASE" in the last 168 hours. No results for input(s): "AMMONIA" in the last 168 hours. Coagulation Profile: No results for input(s): "INR", "PROTIME" in the last 168 hours. Cardiac Enzymes: No results for input(s): "CKTOTAL", "CKMB", "CKMBINDEX", "TROPONINI" in the last 168 hours. BNP (last 3 results) No results for input(s): "PROBNP" in the last 8760 hours. HbA1C: Recent Labs    10/25/22 1400  HGBA1C 7.3*   CBG: Recent Labs  Lab 10/27/22 2223 10/28/22 0147 10/28/22 0532 10/28/22 0752 10/28/22 1124  GLUCAP 141* 140* 162* 150* 174*   Lipid Profile: Recent Labs    10/25/22 1400  CHOL 125  HDL 26.80*  TRIG 223.0*  CHOLHDL 5  LDLDIRECT 64.0   Thyroid Function Tests: No results for input(s): "TSH", "T4TOTAL", "FREET4", "T3FREE",  "THYROIDAB" in the last 72 hours. Anemia Panel: Recent Labs    10/26/22 1250  VITAMINB12 469  FOLATE 7.7  FERRITIN 8*  TIBC 543*  IRON 12*  RETICCTPCT 3.0   Sepsis Labs: Recent Labs  Lab 10/28/22 0317  PROCALCITON 0.83    Recent Results (from the past 240 hour(s))  MRSA Next Gen by PCR, Nasal     Status: None   Collection Time: 10/26/22  1:48 PM   Specimen: Nasal Mucosa; Nasal Swab  Result Value Ref Range Status   MRSA by PCR Next Gen NOT DETECTED NOT DETECTED Final    Comment: (NOTE) The GeneXpert MRSA Assay (FDA approved for NASAL specimens only), is one component of a comprehensive MRSA colonization surveillance program. It is not intended to diagnose MRSA infection nor to guide or monitor treatment for MRSA infections. Test performance is not FDA approved in patients less than 70 years old. Performed at [facility], [address] [place], [place] [address]  Radiology Studies: DG Chest Port 1 View  Result Date: 10/26/2022 CLINICAL DATA:  SOB EXAM: PORTABLE CHEST 1 VIEW COMPARISON:  10/24/20 CXR FINDINGS: No pleural effusion. No pneumothorax. Normal cardiac and mediastinal contours. Bibasilar atelectasis. Visualized upper abdomen is unremarkable. No displaced rib fractures. IMPRESSION: Bibasilar atelectasis. Electronically Signed   By: Marin Roberts M.D.   On: 10/26/2022 14:19        Scheduled Meds:  sodium chloride   Intravenous Once   FLUoxetine  40 mg Oral Daily   gabapentin  300 mg Oral TID   insulin aspart  0-9 Units Subcutaneous Q4H   [START ON 10/30/2022] pantoprazole  40 mg Intravenous Q12H   Continuous Infusions:  cefTRIAXone (ROCEPHIN)  IV     pantoprazole 8 mg/hr (10/28/22 0846)     LOS: 2 days    Time spent: 35 minutes    Abir Craine Darleen Crocker, DO [facility]  If 7PM-7AM, please contact night-coverage [email or web address] 10/28/2022, 12:28 PM

## 2022-10-28 NOTE — Progress Notes (Addendum)
Patient has been alert but intermittently confused this shift. Patient has voiced many times desire to go home.  Patient has had to be reoriented several times.  Patient has been yellow MEWS earlier in shift but vitals have come back into WNL as the shift progressed.  Patient has drank water and tolerated it well.

## 2022-10-28 NOTE — Progress Notes (Signed)
SATURATION QUALIFICATIONS:  Patient Saturations on Room Air at Rest = 76%  Patient Saturations on 3 Liters of oxygen while Ambulating = 94%

## 2022-10-29 ENCOUNTER — Inpatient Hospital Stay (HOSPITAL_COMMUNITY): Payer: PPO

## 2022-10-29 DIAGNOSIS — D62 Acute posthemorrhagic anemia: Secondary | ICD-10-CM | POA: Diagnosis not present

## 2022-10-29 LAB — CBC
HCT: 26.8 % — ABNORMAL LOW (ref 36.0–46.0)
Hemoglobin: 8 g/dL — ABNORMAL LOW (ref 12.0–15.0)
MCH: 26.8 pg (ref 26.0–34.0)
MCHC: 29.9 g/dL — ABNORMAL LOW (ref 30.0–36.0)
MCV: 89.6 fL (ref 80.0–100.0)
Platelets: 447 10*3/uL — ABNORMAL HIGH (ref 150–400)
RBC: 2.99 MIL/uL — ABNORMAL LOW (ref 3.87–5.11)
RDW: 18.2 % — ABNORMAL HIGH (ref 11.5–15.5)
WBC: 22.2 10*3/uL — ABNORMAL HIGH (ref 4.0–10.5)
nRBC: 0.1 % (ref 0.0–0.2)

## 2022-10-29 LAB — BASIC METABOLIC PANEL
Anion gap: 11 (ref 5–15)
BUN: 7 mg/dL — ABNORMAL LOW (ref 8–23)
CO2: 25 mmol/L (ref 22–32)
Calcium: 8.1 mg/dL — ABNORMAL LOW (ref 8.9–10.3)
Chloride: 99 mmol/L (ref 98–111)
Creatinine, Ser: 0.49 mg/dL (ref 0.44–1.00)
GFR, Estimated: >60 mL/min (ref >60)
Glucose, Bld: 118 mg/dL — ABNORMAL HIGH (ref 70–99)
Potassium: 3.2 mmol/L — ABNORMAL LOW (ref 3.5–5.1)
Sodium: 135 mmol/L (ref 135–145)

## 2022-10-29 LAB — MAGNESIUM: Magnesium: 1.8 mg/dL (ref 1.7–2.4)

## 2022-10-29 LAB — GLUCOSE, CAPILLARY
Glucose-Capillary: 139 mg/dL — ABNORMAL HIGH (ref 70–99)
Glucose-Capillary: 128 mg/dL — ABNORMAL HIGH (ref 70–99)

## 2022-10-29 LAB — PROCALCITONIN: Procalcitonin: 0.75 ng/mL

## 2022-10-29 MED ORDER — CLOPIDOGREL BISULFATE 75 MG PO TABS: 75.0000 mg | ORAL_TABLET | Freq: Every day | ORAL | 1 refills | Status: DC

## 2022-10-29 MED ORDER — CEFDINIR 300 MG PO CAPS: 300.0000 mg | ORAL_CAPSULE | Freq: Two times a day (BID) | ORAL | 0 refills | Status: AC

## 2022-10-29 MED ORDER — ASPIRIN 81 MG PO CHEW: 81.0000 mg | CHEWABLE_TABLET | Freq: Every morning | ORAL | 0 refills | Status: DC

## 2022-10-29 MED ORDER — POTASSIUM CHLORIDE CRYS ER 20 MEQ PO TBCR
40.0000 meq | EXTENDED_RELEASE_TABLET | Freq: Once | ORAL | Status: AC
  Administered 2022-10-29: 40 meq via ORAL
  Filled 2022-10-29: qty 2

## 2022-10-29 MED ORDER — AZITHROMYCIN 500 MG PO TABS: 500.0000 mg | ORAL_TABLET | Freq: Every day | ORAL | 0 refills | Status: AC

## 2022-10-29 NOTE — Progress Notes (Signed)
Pt continues to be Tammy Mcpherson during this writers shift but remains stable. Has been oob ambulating to [facility] with 1 assist. Pt IV found lying on bed and another on floor mat, pt unsure how they got there. She has had some intermittent confusion throughout the night and voiced the desire to go home. Pt easily reoriented and resting in bed. Plan of care ongoing.

## 2022-10-29 NOTE — Progress Notes (Signed)
Patient given discharge paperwork and verbally expressed understanding of instructions. All belongings given to patient, patient escorted to front door by staff in wheelchair in stable condition.

## 2022-10-29 NOTE — Discharge Summary (Signed)
Physician Discharge Summary  Tammy Mcpherson OZD:[id number] DOB: Mar 14, 1956 DOA: 10/26/2022  PCP: Inda Coke, PA  Admit date: 10/26/2022  Discharge date: 10/29/2022  Admitted From:Home  Disposition:  Home  Recommendations for Outpatient Follow-up:  Follow up with PCP in 1-2 weeks Recheck CBC in 1 week Follow-up with gastroenterology Dr. Abbey Chatters as scheduled Okay to resume dual antiplatelet therapy 1/29 Continue PPI indefinitely and avoid use of NSAIDs Remain on azithromycin and Omnicef as prescribed for 5 days for treatment of left lower lobe pneumonia  Home Health: None  Equipment/Devices: 3 L nasal cannula oxygen  Discharge Condition:Stable  CODE STATUS: Full  Diet recommendation: Heart Healthy/carb modified  Brief/Interim Summary:  Tammy Mcpherson is a 67 y.o. female with medical history significant for COPD with chronic tobacco abuse, hypertension, PAD with prior stents and toe amputations, type 2 diabetes, iron deficiency anemia, prior findings of erosive gastritis as well as diverticulosis and internal hemorrhoids who presented to the ED with generalized weakness and noted low hemoglobin levels from labs drawn in her PCP office on 1/24.  She was admitted for acute blood loss anemia and underwent 2 unit PRBC transfusion with stable vital signs noted.  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 Diet advanced to soft which she was tolerating without any other issues.  Hemoglobin has remained stable.  Unfortunately, she remained hypoxemic but without acute distress and also had worsening leukocytosis and some mild fevers.  She was noted to have left-sided pneumonia on chest x-ray on the day of discharge and will remain on oral antibiotics at this point for further treatment.  She will be discharged with home oxygen as she is very eager for discharge.  She will follow-up as noted above and start dual antiplatelet  therapy 1/29.  Discharge Diagnoses:  Principal Problem:   Acute blood loss anemia Active Problems:   Essential hypertension   Tobacco abuse   Anemia, iron deficiency   Hyperlipidemia   Neuropathy due to type 2 diabetes mellitus (HCC)   Type 2 diabetes mellitus with hyperglycemia, without long-term current use of insulin (HCC)   Diabetes mellitus (HCC)   Lower limb ischemia   GERD (gastroesophageal reflux disease)   GAD (generalized anxiety disorder)   Peptic ulcer disease   Upper GI bleed   Severe anemia  Principal discharge diagnosis: Acute blood loss anemia status post 2 unit PRBC transfusion in the setting of bleeding AVMs status post APC on enteroscopy 1/26.  Acute hypoxemic respiratory failure secondary to left lower lobe pneumonia.  Discharge Instructions  Discharge Instructions     Diet - low sodium heart healthy   Complete by: As directed    Increase activity slowly   Complete by: As directed       Allergies as of 10/29/2022       Reactions   Bee Venom Swelling   Codeine Anaphylaxis   Tongue swelled   Penicillins Swelling, Rash   Has patient had a PCN reaction causing immediate rash, facial/tongue/throat swelling, SOB or lightheadedness with hypotension: No, delayed Has patient had a PCN reaction causing severe rash involving mucus membranes or skin necrosis: No Has patient had a PCN reaction that required hospitalization No Has patient had a PCN reaction occurring within the last 10 years: No If all of the above answers are "NO", then may proceed with Cephalosporin use.   Bupropion Other (See Comments)   "Made my skin crawl"  Sudafed [pseudoephedrine Hcl] Rash        Medication List     TAKE these medications    acetaminophen 500 MG tablet Commonly known as: TYLENOL Take 500 mg by mouth at bedtime as needed for moderate pain.   albuterol (2.5 MG/3ML) 0.083% nebulizer solution Commonly known as: PROVENTIL Take 3 mLs (2.5 mg total) by nebulization  every 4 (four) hours as needed for wheezing or shortness of breath.   albuterol 108 (90 Base) MCG/ACT inhaler Commonly known as: VENTOLIN HFA INHALE TWO PUFFS INTO THE LUNGS EVERY SIX HOURS AS NEEDED FOR WHEEZING OR SHORTNESS OF BREATH   ALPRAZolam 0.5 MG tablet Commonly known as: XANAX TAKE ONE TABLET (0.'5MG'$  TOTAL) BY MOUTH DAILY AS NEEDED FOR ANXIETY   aspirin 81 MG chewable tablet Chew 1 tablet (81 mg total) by mouth every morning. Start taking on: October 30, 2022   atorvastatin 80 MG tablet Commonly known as: LIPITOR Take 1 tablet (80 mg total) by mouth daily.   azithromycin 500 MG tablet Commonly known as: Zithromax Take 1 tablet (500 mg total) by mouth daily for 5 days. Take 1 tablet daily for 3 days.   blood glucose meter kit and supplies Dispense based on patient and insurance preference. Use up to four times daily as directed. (FOR ICD-10 E10.9, E11.9).   Breztri Aerosphere 160-9-4.8 MCG/ACT Aero Generic drug: Budeson-Glycopyrrol-Formoterol Inhale 2 puffs into the lungs 2 (two) times daily.   cefdinir 300 MG capsule Commonly known as: OMNICEF Take 1 capsule (300 mg total) by mouth 2 (two) times daily for 5 days.   clopidogrel 75 MG tablet Commonly known as: PLAVIX Take 1 tablet (75 mg total) by mouth daily. TAKE ONE TABLET ('75MG'$  TOTAL) BY MOUTH DAILY WITH BREAKFAST Start taking on: October 30, 2022 What changed:  how much to take how to take this when to take this   cyanocobalamin 1000 MCG/ML injection Commonly known as: VITAMIN B12 INJECT 1 ML INTO THE MUSCLE EVERY 30 DAYS. What changed: See the new instructions.   cyclobenzaprine 5 MG tablet Commonly known as: FLEXERIL Take 1 tablet (5 mg total) by mouth 3 (three) times daily as needed for muscle spasms. What changed: how much to take   Entresto 49-51 MG Generic drug: sacubitril-valsartan Take 1 tablet by mouth 2 (two) times daily. What changed: when to take this   FLUoxetine 40 MG  capsule Commonly known as: PROZAC Take 1 capsule (40 mg total) by mouth daily.   FreeStyle [place] 2 [facility] Check blood glucose up to 4 times daily   FreeStyle Libre 2 Sensor Misc Apply one sensor to the upper arm avery 14 days.   gabapentin 300 MG capsule Commonly known as: NEURONTIN TAKE ONE CAPSULE (300 MG TOTAL) BY MOUTHTWO TIMES DAILY. What changed:  how much to take how to take this when to take this   glucose blood test strip Commonly known as: OneTouch Ultra Use as instructed   glucose monitoring kit monitoring kit Use to check blood sugar BID as directed   metFORMIN 500 MG 24 hr tablet Commonly known as: GLUCOPHAGE-XR Take 1 tablet (500 mg total) by mouth daily with breakfast.   Misc. Devices Misc Please provide supplies (needle, syringe, alcohol swabs) needed for patient to self-administer B-12 injections monthly.   OneTouch Delica Plus [id number] Misc 1 each by Does not apply route 2 (two) times a day.   pantoprazole 40 MG tablet Commonly known as: PROTONIX TAKE ONE (1) TABLET 30 MINS BEFORE YOUR FIRST  MEAL.   traMADol 50 MG tablet Commonly known as: ULTRAM Take 1 tablet (50 mg total) by mouth every 12 (twelve) hours as needed for severe pain.   UltiCare Tuberculin Safety Syr 25G X 1" 1 ML Misc Generic drug: Tuberculin-Allergy Syringes USE TO ADMINISTER INJECTABLE VITAMIN B12.   Vitamin D 50 MCG (2000 UT) Caps Take 1 capsule (2,000 Units total) by mouth daily.               Durable Medical Equipment  (From admission, onward)           Start     Ordered   10/29/22 0845  For home use only DME oxygen  Once       Question Answer Comment  Length of Need Lifetime   Mode or (Route) Nasal cannula   Liters per Minute 3   Frequency Continuous (stationary and portable oxygen unit needed)   Oxygen conserving device Yes   Oxygen delivery system Gas      10/29/22 0844            Follow-up Information     Inda Coke, PA. Schedule  an appointment as soon as possible for a visit in 1 week(s).   Specialty: Physician Assistant Contact information: [place] [address] [place], Boykins K, DO. Go to.   Specialty: Gastroenterology Contact information: [place] [address] [phone]                Allergies  Allergen Reactions   Bee Venom Swelling   Codeine Anaphylaxis    Tongue swelled   Penicillins Swelling and Rash    Has patient had a PCN reaction causing immediate rash, facial/tongue/throat swelling, SOB or lightheadedness with hypotension: No, delayed Has patient had a PCN reaction causing severe rash involving mucus membranes or skin necrosis: No Has patient had a PCN reaction that required hospitalization No Has patient had a PCN reaction occurring within the last 10 years: No If all of the above answers are "NO", then may proceed with Cephalosporin use.    Bupropion Other (See Comments)    "Made my skin crawl"   Sudafed [Pseudoephedrine Hcl] Rash    Consultations: GI   Procedures/Studies: DG CHEST PORT 1 VIEW  Result Date: 10/29/2022 CLINICAL DATA:  Hypoxemia. EXAM: PORTABLE CHEST 1 VIEW COMPARISON:  10/26/2022 FINDINGS: The heart size and mediastinal contours are within normal limits. Increased pleural-parenchymal opacity is seen in the lateral left lung base, which may be due to atelectasis or infiltrate. Right lung remains clear. IMPRESSION: Increased left basilar atelectasis versus infiltrate. Electronically Signed   By: Marlaine Hind M.D.   On: 10/29/2022 09:55   DG Chest Port 1 View  Result Date: 10/26/2022 CLINICAL DATA:  SOB EXAM: PORTABLE CHEST 1 VIEW COMPARISON:  10/24/20 CXR FINDINGS: No pleural effusion. No pneumothorax. Normal cardiac and mediastinal contours. Bibasilar atelectasis. Visualized upper abdomen is unremarkable. No displaced rib fractures. IMPRESSION: Bibasilar atelectasis. Electronically Signed   By: Marin Roberts M.D.   On: 10/26/2022 14:19     Discharge Exam: Vitals:   10/29/22 0122 10/29/22 0442  BP: (!) 175/59 (!) 148/59  Pulse: (!) 118 (!) 113  Resp: (!) 24 20  Temp: 98.4 F (36.9 C) 98.6 F (37 C)  SpO2: 93% 90%   Vitals:   10/28/22 1737 10/28/22 2119 10/29/22 0122 10/29/22 0442  BP: (!) 137/53 (!) 137/54 (!) 175/59 (!) 148/59  Pulse: Marland Kitchen)  111 (!) 106 (!) 118 (!) 113  Resp: 20 18 (!) 24 20  Temp:  97.8 F (36.6 C) 98.4 F (36.9 C) 98.6 F (37 C)  TempSrc:    Oral  SpO2: 91% 94% 93% 90%  Weight:      Height:        General: Pt is alert, awake, not in acute distress Cardiovascular: RRR, S1/S2 +, no rubs, no gallops Respiratory: CTA bilaterally, no wheezing, no rhonchi, 2-3 L nasal cannula Abdominal: Soft, NT, ND, bowel sounds + Extremities: no edema, no cyanosis    The results of significant diagnostics from this hospitalization (including imaging, microbiology, ancillary and laboratory) are listed below for reference.     Microbiology: Recent Results (from the past 240 hour(s))  MRSA Next Gen by PCR, Nasal     Status: None   Collection Time: 10/26/22  1:48 PM   Specimen: Nasal Mucosa; Nasal Swab  Result Value Ref Range Status   MRSA by PCR Next Gen NOT DETECTED NOT DETECTED Final    Comment: (NOTE) The GeneXpert MRSA Assay (FDA approved for NASAL specimens only), is one component of a comprehensive MRSA colonization surveillance program. It is not intended to diagnose MRSA infection nor to guide or monitor treatment for MRSA infections. Test performance is not FDA approved in patients less than 21 years old. Performed at [facility], [address], [place], [place] [address]   Culture, blood (Routine X 2) w Reflex to ID Panel     Status: None (Preliminary result)   Collection Time: 10/28/22  7:16 AM   Specimen: Right Antecubital; Blood  Result Value Ref Range Status   Specimen Description   Final    RIGHT ANTECUBITAL BOTTLES DRAWN AEROBIC AND ANAEROBIC    Special Requests   Final    Blood Culture results may not be optimal due to an excessive volume of blood received in culture bottles   Culture   Final    NO GROWTH < 24 HOURS Performed at [facility], [address], [place], [place] [address]    Report Status PENDING  Incomplete  Culture, blood (Routine X 2) w Reflex to ID Panel     Status: None (Preliminary result)   Collection Time: 10/28/22  7:16 AM   Specimen: Left Antecubital; Blood  Result Value Ref Range Status   Specimen Description   Final    LEFT ANTECUBITAL BOTTLES DRAWN AEROBIC AND ANAEROBIC   Special Requests Blood Culture adequate volume  Final   Culture   Final    NO GROWTH < 24 HOURS Performed at [facility], [address] [place], [place] [address]    Report Status PENDING  Incomplete     Labs: BNP (last 3 results) No results for input(s): "BNP" in the last 8760 hours. Basic Metabolic Panel: Recent Labs  Lab 10/25/22 1400 10/26/22 1250 10/27/22 0709 10/28/22 0317 10/29/22 0405  NA 136 134* 138 136 135  K 4.0 3.0* 4.1 3.4* 3.2*  CL 99 99 106 102 99  CO2 '24 23 25 26 25  '$ GLUCOSE 194* 197* 135* 117* 118*  BUN '13 11 8 '$ 7* 7*  CREATININE 0.88 0.98 0.58 0.54 0.49  CALCIUM 8.8 8.5* 7.5* 7.7* 8.1*  MG  --   --  1.9 1.7 1.8   Liver Function Tests: Recent Labs  Lab 10/25/22 1400 10/26/22 1250 10/27/22 0709  AST 8 15 11*  ALT '7 11 11  '$ ALKPHOS 95 97 93  BILITOT 0.2 0.4 0.6  PROT  6.5 6.9 6.4*  ALBUMIN 3.7 3.2* 2.9*   No results for input(s): "LIPASE", "AMYLASE" in the last 168 hours. No results for input(s): "AMMONIA" in the last 168 hours. CBC: Recent Labs  Lab 10/25/22 1400 10/26/22 1250 10/27/22 0709 10/28/22 0317 10/29/22 0405  WBC 22.1 Repeated and verified X2.* 19.8* 21.7* 21.0* 22.2*  NEUTROABS 18.3* 15.5*  --   --   --   HGB 5.3 Repeated and verified X2.* 5.4* 8.9* 8.1* 8.0*  HCT 17.6 Repeated and verified X2.* 18.8* 28.4* 26.4* 26.8*  MCV 82.1 84.3 87.7 88.3 89.6  PLT 634.0* 639*  477* 429* 447*   Cardiac Enzymes: No results for input(s): "CKTOTAL", "CKMB", "CKMBINDEX", "TROPONINI" in the last 168 hours. BNP: Invalid input(s): "POCBNP" CBG: Recent Labs  Lab 10/28/22 1652 10/28/22 2037 10/28/22 2336 10/29/22 0352 10/29/22 0746  GLUCAP 142* 142* 110* 139* 128*   D-Dimer No results for input(s): "DDIMER" in the last 72 hours. Hgb A1c No results for input(s): "HGBA1C" in the last 72 hours. Lipid Profile No results for input(s): "CHOL", "HDL", "LDLCALC", "TRIG", "CHOLHDL", "LDLDIRECT" in the last 72 hours. Thyroid function studies No results for input(s): "TSH", "T4TOTAL", "T3FREE", "THYROIDAB" in the last 72 hours.  Invalid input(s): "FREET3" Anemia work up Recent Labs    10/26/22 1250  VITAMINB12 469  FOLATE 7.7  FERRITIN 8*  TIBC 543*  IRON 12*  RETICCTPCT 3.0   Urinalysis    Component Value Date/Time   COLORURINE STRAW (A) 10/26/2022 1540   APPEARANCEUR CLEAR 10/26/2022 1540   LABSPEC 1.004 (L) 10/26/2022 1540   PHURINE 6.0 10/26/2022 1540   GLUCOSEU NEGATIVE 10/26/2022 1540   GLUCOSEU NEGATIVE 04/26/2022 1508   HGBUR SMALL (A) 10/26/2022 1540   BILIRUBINUR NEGATIVE 10/26/2022 1540   BILIRUBINUR negative 06/17/2020 1534   KETONESUR NEGATIVE 10/26/2022 1540   PROTEINUR NEGATIVE 10/26/2022 1540   UROBILINOGEN 0.2 04/26/2022 1508   NITRITE NEGATIVE 10/26/2022 1540   LEUKOCYTESUR TRACE (A) 10/26/2022 1540   Sepsis Labs Recent Labs  Lab 10/26/22 1250 10/27/22 0709 10/28/22 0317 10/29/22 0405  WBC 19.8* 21.7* 21.0* 22.2*   Microbiology Recent Results (from the past 240 hour(s))  MRSA Next Gen by PCR, Nasal     Status: None   Collection Time: 10/26/22  1:48 PM   Specimen: Nasal Mucosa; Nasal Swab  Result Value Ref Range Status   MRSA by PCR Next Gen NOT DETECTED NOT DETECTED Final    Comment: (NOTE) The GeneXpert MRSA Assay (FDA approved for NASAL specimens only), is one component of a comprehensive MRSA colonization  surveillance program. It is not intended to diagnose MRSA infection nor to guide or monitor treatment for MRSA infections. Test performance is not FDA approved in patients less than 60 years old. Performed at [facility], [address] [place], [place] [address]   Culture, blood (Routine X 2) w Reflex to ID Panel     Status: None (Preliminary result)   Collection Time: 10/28/22  7:16 AM   Specimen: Right Antecubital; Blood  Result Value Ref Range Status   Specimen Description   Final    RIGHT ANTECUBITAL BOTTLES DRAWN AEROBIC AND ANAEROBIC   Special Requests   Final    Blood Culture results may not be optimal due to an excessive volume of blood received in culture bottles   Culture   Final    NO GROWTH < 24 HOURS Performed at [facility], [address], [place], [place] [address]    Report Status PENDING  Incomplete  Culture, blood (Routine X 2) w Reflex to ID Panel     Status: None (Preliminary result)   Collection Time: 10/28/22  7:16 AM   Specimen: Left Antecubital; Blood  Result Value Ref Range Status   Specimen Description   Final    LEFT ANTECUBITAL BOTTLES DRAWN AEROBIC AND ANAEROBIC   Special Requests Blood Culture adequate volume  Final   Culture   Final    NO GROWTH < 24 HOURS Performed at [facility], [address], [place], [place] [address]    Report Status PENDING  Incomplete     Time coordinating discharge: 35 minutes  SIGNED:   Rodena Goldmann, DO [facility] 10/29/2022, 10:44 AM  If 7PM-7AM, please contact night-coverage [email or web address]

## 2022-10-29 NOTE — TOC Transition Note (Signed)
Transition of Care [facility]) - CM/SW Discharge Note   Patient Details  Name: Tammy Mcpherson MRN: [id number] Date of Birth: [birth year]  [facility] CM/SW Contact:  Iona Beard, [place] Phone Number: 10/29/2022, 10:18 AM   Clinical Narrative:    CSW updated that pt is medically ready for D/C and needs home O2 set up. CSW spoke with pts husband and they do not have DME agency preference. CSW spoke to [place] with [facility] who accepts referral. CSW confirmed sat qual and orders have been completed. O2 tank delivered to room and DME agency will complete home set up once pt discharges home. TOC signing off.   Final next level of care: Home/Self Care Barriers to Discharge: Barriers Resolved   Patient Goals and CMS Choice CMS Medicare.gov Compare Post Acute Care list provided to:: Patient Choice offered to / list presented to : Patient, Spouse  Discharge Placement                         Discharge Plan and Services Additional resources added to the After Visit Summary for                  DME Arranged: Oxygen DME Agency: [facility] Date DME Agency Contacted: 10/29/22   Representative spoke with at DME Agency: Leatrice Jewels            Social Determinants of Health ([place]) Interventions SDOH Screenings   Alcohol Screen: Low Risk  (10/27/2019)  Depression (PHQ2-9): Low Risk  (10/25/2022)  Tobacco Use: High Risk (10/27/2022)     Readmission Risk Interventions     No data to display

## 2022-10-30 ENCOUNTER — Telehealth: Payer: Self-pay | Admitting: *Deleted

## 2022-10-30 ENCOUNTER — Telehealth: Payer: Self-pay

## 2022-10-30 LAB — TYPE AND SCREEN
ABO/RH(D): O POS
Antibody Screen: POSITIVE
Donor AG Type: NEGATIVE
Donor AG Type: NEGATIVE
PT AG Type: NEGATIVE
Unit division: 0
Unit division: 0
Unit division: 0
Unit division: 0

## 2022-10-30 LAB — CULTURE, BLOOD (ROUTINE X 2)

## 2022-10-30 LAB — BPAM RBC
Blood Product Expiration Date: 202402292359
Blood Product Expiration Date: 202402292359
Blood Product Expiration Date: 202402292359
Blood Product Expiration Date: 202403052359
ISSUE DATE / TIME: 202401252315
ISSUE DATE / TIME: 202401260131
Unit Type and Rh: 5100
Unit Type and Rh: 5100
Unit Type and Rh: 5100
Unit Type and Rh: 5100

## 2022-10-30 LAB — SURGICAL PATHOLOGY

## 2022-10-30 NOTE — Patient Outreach (Signed)
  Care Coordination TOC Note Transition Care Management Follow-up Telephone Call Date of discharge and from where: 10/29/22-[facility] Dx:"severe anemia" How have you been since you were released from the hospital? Patient states she is "doing okay but still weak." She is aware that it will take some time for her to regain her strength. She has been up ambulating around the home with use of walker. She voices that spouse went out today and bought a pulse ox machine. Sats have been around 90%-patient using 2.5-3L/min continuously. She denies any SOB. Appetite has been fair. Patient reports no GI sxs Any questions or concerns? No  Items Reviewed: Did the pt receive and understand the discharge instructions provided? Yes  Medications obtained and verified? Yes  Other? No  Any new allergies since your discharge? No  Dietary orders reviewed? Yes Do you have support at home? Yes   Home Care and Equipment/Supplies: Were home health services ordered? not applicable If so, what is the name of the agency? N/A  Has the agency set up a time to come to the patient's home? not applicable Were any new equipment or medical supplies ordered?  Yes: oxygen What is the name of the medical supply agency?[facility] Were you able to get the supplies/equipment? yes Do you have any questions related to the use of the equipment or supplies? No  Functional Questionnaire: (I = Independent and D = Dependent) ADLs: A  Bathing/Dressing- I  Meal Prep- A  Eating- I  Maintaining continence- I  Transferring/Ambulation- I  Managing Meds- I  Follow up appointments reviewed:  PCP Hospital f/u appt confirmed? No  -Patient agreeable to care guide calling to assist with scheduling. [facility] f/u appt confirmed? No  Patient has GI office number-will call and make an appt Are transportation arrangements needed? No  If their condition worsens, is the pt aware to call PCP or go to the Emergency Dept.?  Yes Was the patient provided with contact information for the PCP's office or ED? Yes Was to pt encouraged to call back with questions or concerns? Yes  SDOH assessments and interventions completed:   Yes SDOH Interventions Today    Flowsheet Row Most Recent Value  SDOH Interventions   Food Insecurity Interventions Intervention Not Indicated  Transportation Interventions Intervention Not Indicated       Care Coordination Interventions:  Interventions Today    Flowsheet Row Most Recent Value  Nutrition Interventions   Nutrition Discussed/Reviewed Nutrition Discussed  Pharmacy Interventions   Pharmacy Dicussed/Reviewed Medications and their functions  [reviewed abx therapy]       TOC Interventions Today    Flowsheet Row Most Recent Value  TOC Interventions   TOC Interventions Discussed/Reviewed TOC Interventions Discussed, Arranged PCP follow up within 7 days/Care Guide scheduled  [resp mgmt]        Encounter Outcome:  Pt. Visit Completed    Enzo Montgomery, RN,BSN,CCM [place] [facility] Telephonic Care Management Coordinator Direct Phone: [phone] Toll Free: [phone] Fax: [phone]

## 2022-10-30 NOTE — Progress Notes (Unsigned)
  Care Coordination  Note  10/30/2022 Name: TAHTIANA ROZIER MRN: [id number] DOB: 04/22/1956  TINLEE NAVARRETTE is a 67 y.o. year old primary care patient of Inda Coke, [place]. I reached out to Suszanne Finch by phone today to assist with scheduling a follow up appointment. Faigy Stretch Rueth verbally consented to my assistance.       Follow up plan: Unsuccessful telephone outreach attempt made. A HIPAA compliant phone message was left for the patient providing contact information and requesting a return call.   Julian Hy, [place] Direct Dial: [phone]

## 2022-10-31 ENCOUNTER — Encounter: Payer: Self-pay | Admitting: Internal Medicine

## 2022-10-31 NOTE — Progress Notes (Unsigned)
  Care Coordination  Note  10/31/2022 Name: AVREY FLANAGIN MRN: [id number] DOB: August 19, 1956  JENAVEVE FENSTERMAKER is a 67 y.o. year old primary care patient of Inda Coke, [place]. I reached out to Suszanne Finch by phone today to assist with scheduling a follow up appointment. Taran Hable Byron verbally consented to my assistance.       Follow up plan: 2nd Unsuccessful telephone outreach attempt made. A HIPAA compliant phone message was left for the patient providing contact information and requesting a return call.   Julian Hy, [place] Direct Dial: [phone]

## 2022-11-01 ENCOUNTER — Encounter (HOSPITAL_COMMUNITY): Payer: Self-pay | Admitting: Internal Medicine

## 2022-11-02 LAB — CULTURE, BLOOD (ROUTINE X 2)
Culture: NO GROWTH
Special Requests: ADEQUATE

## 2022-11-02 NOTE — Progress Notes (Signed)
Pt scheduled for 11/06/2022 previously

## 2022-11-06 ENCOUNTER — Encounter: Payer: Self-pay | Admitting: Physician Assistant

## 2022-11-06 ENCOUNTER — Emergency Department (HOSPITAL_COMMUNITY)
Admission: EM | Admit: 2022-11-06 | Discharge: 2022-11-06 | Discharge status: Against Medical Advice | Payer: PPO | Attending: Emergency Medicine | Admitting: Emergency Medicine

## 2022-11-06 ENCOUNTER — Other Ambulatory Visit: Payer: Self-pay

## 2022-11-06 ENCOUNTER — Other Ambulatory Visit: Payer: Self-pay | Admitting: *Deleted

## 2022-11-06 ENCOUNTER — Ambulatory Visit (INDEPENDENT_AMBULATORY_CARE_PROVIDER_SITE_OTHER): Payer: PPO | Admitting: Physician Assistant

## 2022-11-06 ENCOUNTER — Emergency Department (HOSPITAL_COMMUNITY): Payer: PPO

## 2022-11-06 VITALS — BP 150/60 | HR 118 | Temp 97.5°F | Ht 61.0 in | Wt 134.0 lb

## 2022-11-06 DIAGNOSIS — I10 Essential (primary) hypertension: Secondary | ICD-10-CM | POA: Insufficient documentation

## 2022-11-06 DIAGNOSIS — E119 Type 2 diabetes mellitus without complications: Secondary | ICD-10-CM | POA: Diagnosis not present

## 2022-11-06 DIAGNOSIS — I2510 Atherosclerotic heart disease of native coronary artery without angina pectoris: Secondary | ICD-10-CM | POA: Insufficient documentation

## 2022-11-06 DIAGNOSIS — D649 Anemia, unspecified: Secondary | ICD-10-CM

## 2022-11-06 DIAGNOSIS — J449 Chronic obstructive pulmonary disease, unspecified: Secondary | ICD-10-CM | POA: Insufficient documentation

## 2022-11-06 DIAGNOSIS — L03116 Cellulitis of left lower limb: Secondary | ICD-10-CM | POA: Diagnosis not present

## 2022-11-06 DIAGNOSIS — Z79899 Other long term (current) drug therapy: Secondary | ICD-10-CM | POA: Diagnosis not present

## 2022-11-06 DIAGNOSIS — M79672 Pain in left foot: Secondary | ICD-10-CM

## 2022-11-06 DIAGNOSIS — Z7984 Long term (current) use of oral hypoglycemic drugs: Secondary | ICD-10-CM | POA: Diagnosis not present

## 2022-11-06 DIAGNOSIS — R000 Tachycardia, unspecified: Secondary | ICD-10-CM | POA: Insufficient documentation

## 2022-11-06 DIAGNOSIS — J439 Emphysema, unspecified: Secondary | ICD-10-CM

## 2022-11-06 DIAGNOSIS — Z7982 Long term (current) use of aspirin: Secondary | ICD-10-CM | POA: Insufficient documentation

## 2022-11-06 DIAGNOSIS — Z7902 Long term (current) use of antithrombotics/antiplatelets: Secondary | ICD-10-CM | POA: Diagnosis not present

## 2022-11-06 DIAGNOSIS — I998 Other disorder of circulatory system: Secondary | ICD-10-CM | POA: Diagnosis not present

## 2022-11-06 LAB — BASIC METABOLIC PANEL
Anion gap: 12 (ref 5–15)
BUN: 14 mg/dL (ref 8–23)
CO2: 26 mmol/L (ref 22–32)
Calcium: 9.1 mg/dL (ref 8.9–10.3)
Chloride: 100 mmol/L (ref 98–111)
Creatinine, Ser: 0.85 mg/dL (ref 0.44–1.00)
GFR, Estimated: >60 mL/min (ref >60)
Glucose, Bld: 160 mg/dL — ABNORMAL HIGH (ref 70–99)
Potassium: 3.2 mmol/L — ABNORMAL LOW (ref 3.5–5.1)
Sodium: 138 mmol/L (ref 135–145)

## 2022-11-06 LAB — CBC WITH DIFFERENTIAL/PLATELET
Abs Immature Granulocytes: 0.1 10*3/uL — ABNORMAL HIGH (ref 0.00–0.07)
Basophils Absolute: 0.1 10*3/uL (ref 0.0–0.1)
Basophils Relative: 0 %
Eosinophils Absolute: 0.1 10*3/uL (ref 0.0–0.5)
Eosinophils Relative: 1 %
HCT: 31 % — ABNORMAL LOW (ref 36.0–46.0)
Hemoglobin: 9.3 g/dL — ABNORMAL LOW (ref 12.0–15.0)
Immature Granulocytes: 1 %
Lymphocytes Relative: 12 %
Lymphs Abs: 1.8 10*3/uL (ref 0.7–4.0)
MCH: 25.8 pg — ABNORMAL LOW (ref 26.0–34.0)
MCHC: 30 g/dL (ref 30.0–36.0)
MCV: 86.1 fL (ref 80.0–100.0)
Monocytes Absolute: 1.4 10*3/uL — ABNORMAL HIGH (ref 0.1–1.0)
Monocytes Relative: 9 %
Neutro Abs: 11.4 10*3/uL — ABNORMAL HIGH (ref 1.7–7.7)
Neutrophils Relative %: 77 %
Platelets: 793 10*3/uL — ABNORMAL HIGH (ref 150–400)
RBC: 3.6 MIL/uL — ABNORMAL LOW (ref 3.87–5.11)
RDW: 18 % — ABNORMAL HIGH (ref 11.5–15.5)
WBC: 14.8 10*3/uL — ABNORMAL HIGH (ref 4.0–10.5)
nRBC: 0 % (ref 0.0–0.2)

## 2022-11-06 MED ORDER — SULFAMETHOXAZOLE-TRIMETHOPRIM 800-160 MG PO TABS: 1.0000 | ORAL_TABLET | Freq: Two times a day (BID) | ORAL | 0 refills | Status: AC

## 2022-11-06 NOTE — Patient Instructions (Addendum)
It was great to see you!  We were not able to secure an appointment for you today.  Please go to your closest ER to have your foot evaluated for possible osteomyelitis. This is very important and it cannot wait another day.  Take care,  Inda Coke PA-C

## 2022-11-06 NOTE — Progress Notes (Signed)
Tammy Mcpherson is a 67 y.o. female here for a follow up of a pre-existing problem.  History of Present Illness:   Chief Complaint  Patient presents with   Hospitalization Follow-up    Pt says she is feeling weak.   Foot Pain    Pt c/o 3 spots on bottom on foot, having pan x 1 month.    Foot Pain    Patient presents for hospital follow-up.  She went to ER with severe anemia -- was found to have GI bleed and had 2 units PRBC and endoscopy which revealed 20 AVMs. She had CXR towards end of admission, which showed possible infiltrate or atelectasis. She was discharged on azithromycin. She completed this. She remains weak.  She has ongoing, severe, worsening pain in her L foot.   She cannot bear weight on this.   She was discharged on 3L of oxygen. She has not been using this. Husband has a pulse oximeter and states that her oxygen has been between 95-97%.   Past Medical History:  Diagnosis Date   Anemia 04/30/2016   Anxiety    Arthritis    Cardiomyopathy ([place])    a. EF 40-45% by echo in 04/2016 b. Improved to 60-65% by repeat imaging in 2018   COPD (chronic obstructive pulmonary disease) (HCC)    Coronary artery calcification seen on CT scan    Essential hypertension    GERD (gastroesophageal reflux disease)    Headache    History of bronchitis    History of GI bleed    History of hiatal hernia    Hyperlipidemia    Iron deficiency anemia    Peripheral vascular disease (HCC)    Pollen allergy    PSVT (paroxysmal supraventricular tachycardia)    PVC's (premature ventricular contractions)    Type 2 diabetes mellitus (HCC)    Vitamin B12 deficiency      Social History   Tobacco Use   Smoking status: Light Smoker    Packs/day: 0.50    Years: 44.00    Total pack years: 22.00    Types: Cigarettes    Start date: 05/10/1975    Passive exposure: Never   Smokeless tobacco: Never   Tobacco comments:    2 cigarettes per day 12/10/2019  Vaping Use   Vaping Use: Former   Substance Use Topics   Alcohol use: Not Currently    Alcohol/week: 2.0 standard drinks of alcohol    Types: 2 Glasses of wine per week   Drug use: No    Past Surgical History:  Procedure Laterality Date   ABDOMINAL AORTOGRAM W/LOWER EXTREMITY Bilateral 05/19/2021   Procedure: ABDOMINAL AORTOGRAM W/LOWER EXTREMITY;  Surgeon: [name], MD;  Location: [facility];  Service: Cardiovascular;  Laterality: Bilateral;   ABDOMINAL HYSTERECTOMY     polyps  about age 89   AIKEN OSTEOTOMY Right 07/10/2013   Procedure: Barbie Banner OSTEOTOMY RIGHT FOOT;  Surgeon: Marcheta Grammes, DPM;  Location: [facility];  Service: Orthopedics;  Laterality: Right;   AMPUTATION Left 05/09/2017   Procedure: AMPUTATION 5TH TOE LEFT FOOT;  Surgeon: [name], DPM;  Location: [facility];  Service: Podiatry;  Laterality: Left;   AMPUTATION Left 06/13/2017   Procedure: AMPUTATION 2ND TOE LEFT FOOT;  Surgeon: [name], DPM;  Location: [facility];  Service: Podiatry;  Laterality: Left;   AMPUTATION Right 06/01/2021   Procedure: Right great toe and Second Toe Amputation;  Surgeon: [name], MD;  Location: [facility];  Service:  Vascular;  Laterality: Right;   BIOPSY  10/27/2022   Procedure: BIOPSY;  Surgeon: Daneil Dolin, MD;  [facility];  Service: Endoscopy;;   BUNIONECTOMY Right 07/10/2013   Procedure: Prudy Feeler BUNIONECTOMY RIGHT FOOT;  Surgeon: Marcheta Grammes, DPM;  Location: [facility];  Service: Orthopedics;  Laterality: Right;   CHOLECYSTECTOMY N/A 08/22/2017   Procedure: LAPAROSCOPIC CHOLECYSTECTOMY;  Surgeon: Virl Cagey, MD;  Location: [facility];  Service: General;  Laterality: N/A;   COLONOSCOPY N/A 07/07/2016   Dr. Oneida Alar; redundant left colon, diverticulosis at hepatic flexure, non-bleeding internal hemorrhoids   ENTEROSCOPY N/A 10/27/2022   Procedure: ENTEROSCOPY;  Surgeon: Daneil Dolin, MD;  [facility];  Service: Endoscopy;  Laterality: N/A;    ESOPHAGOGASTRODUODENOSCOPY N/A 07/07/2016   Dr. Oneida Alar: many non-bleeding cratered gastric ulcers without stigmata of bleeding in gastric antrum. four 2-3 mm angioectasias without bleeding in duodenal bulb and second portion of duodenum s/p APC. Chroni gastritis on path.    ESOPHAGOGASTRODUODENOSCOPY N/A 08/03/2017   Dr. Oneida Alar: erosive gastritis, AVMs. Found a single non-bleeding angioectasia in stomach, s/p APC therapy. Four non-bleeding angioectasias in duodenum s/p APC. Non-bleeding erosive gastropathy   HOT HEMOSTASIS  10/27/2022   Procedure: HOT HEMOSTASIS (ARGON PLASMA COAGULATION/BICAP);  Surgeon: Daneil Dolin, MD;  [facility];  Service: Endoscopy;;   IR ANGIOGRAM EXTREMITY LEFT  04/24/2017   IR FEM POP ART ATHERECT INC PTA MOD SED  04/24/2017   IR INFUSION THROMBOL ARTERIAL INITIAL (MS)  04/24/2017   IR RADIOLOGIST EVAL & MGMT  12/05/2016   IR US GUIDE VASC ACCESS RIGHT  04/24/2017   LIVER BIOPSY N/A 08/22/2017   Procedure: LIVER BIOPSY;  Surgeon: Virl Cagey, MD;  Location: [facility];  Service: General;  Laterality: N/A;   METATARSAL HEAD EXCISION Right 07/10/2013   Procedure: METATARSAL HEAD RESECTION OF DIGITS 2 AND 3 RIGHT FOOT;  Surgeon: Marcheta Grammes, DPM;  Location: [facility];  Service: Orthopedics;  Laterality: Right;   PERIPHERAL VASCULAR INTERVENTION Right 05/19/2021   Procedure: PERIPHERAL VASCULAR INTERVENTION;  Surgeon: [name], MD;  Location: [facility];  Service: Cardiovascular;  Laterality: Right;   PROXIMAL INTERPHALANGEAL FUSION (PIP) Right 07/10/2013   Procedure: ARTHRODESIS PIPJ  2ND DIGIT RIGHT FOOT;  Surgeon: Marcheta Grammes, DPM;  Location: [facility];  Service: Orthopedics;  Laterality: Right;    Family History  Adopted: Yes  Problem Relation Age of Onset   Hypertension Father    Coronary artery disease Sister    Ulcers Maternal Grandmother    Colon cancer Neg Hx        unknown, was adopted    Allergies  Allergen  Reactions   Bee Venom Swelling   Codeine Anaphylaxis    Tongue swelled   Penicillins Swelling and Rash    Has patient had a PCN reaction causing immediate rash, facial/tongue/throat swelling, SOB or lightheadedness with hypotension: No, delayed Has patient had a PCN reaction causing severe rash involving mucus membranes or skin necrosis: No Has patient had a PCN reaction that required hospitalization No Has patient had a PCN reaction occurring within the last 10 years: No If all of the above answers are "NO", then may proceed with Cephalosporin use.    Bupropion Other (See Comments)    "Made my skin crawl"   Sudafed [Pseudoephedrine Hcl] Rash    Current Medications:   Current Outpatient Medications:    acetaminophen (TYLENOL) 500 MG tablet, Take 500 mg by mouth  at bedtime as needed for moderate pain., Disp: , Rfl:    albuterol (PROVENTIL) (2.5 MG/3ML) 0.083% nebulizer solution, Take 3 mLs (2.5 mg total) by nebulization every 4 (four) hours as needed for wheezing or shortness of breath., Disp: 75 mL, Rfl: 5   albuterol (VENTOLIN HFA) 108 (90 Base) MCG/ACT inhaler, INHALE TWO PUFFS INTO THE LUNGS EVERY SIX HOURS AS NEEDED FOR WHEEZING OR SHORTNESS OF BREATH, Disp: 8.5 g, Rfl: 5   ALPRAZolam (XANAX) 0.5 MG tablet, TAKE ONE TABLET (0.'5MG'$  TOTAL) BY MOUTH DAILY AS NEEDED FOR ANXIETY, Disp: 30 tablet, Rfl: 2   aspirin 81 MG chewable tablet, Chew 1 tablet (81 mg total) by mouth every morning., Disp: 30 tablet, Rfl: 0   atorvastatin (LIPITOR) 80 MG tablet, Take 1 tablet (80 mg total) by mouth daily., Disp: 90 tablet, Rfl: 3   blood glucose meter kit and supplies, Dispense based on patient and insurance preference. Use up to four times daily as directed. (FOR ICD-10 E10.9, E11.9)., Disp: 1 each, Rfl: 0   Budeson-Glycopyrrol-Formoterol (BREZTRI AEROSPHERE) 160-9-4.8 MCG/ACT AERO, Inhale 2 puffs into the lungs 2 (two) times daily., Disp: 11.8 g, Rfl: 12   Cholecalciferol (VITAMIN D) 50 MCG (2000 UT)  CAPS, Take 1 capsule (2,000 Units total) by mouth daily., Disp: 90 capsule, Rfl: 4   clopidogrel (PLAVIX) 75 MG tablet, Take 1 tablet (75 mg total) by mouth daily. TAKE ONE TABLET ('75MG'$  TOTAL) BY MOUTH DAILY WITH BREAKFAST, Disp: 90 tablet, Rfl: 1   Continuous Blood Gluc Receiver (FREESTYLE LIBRE 2 READER) DEVI, Check blood glucose up to 4 times daily, Disp: 1 each, Rfl: 0   Continuous Blood Gluc Sensor (FREESTYLE LIBRE 2 SENSOR) MISC, Apply one sensor to the upper arm avery 14 days., Disp: 6 each, Rfl: 1   cyanocobalamin (VITAMIN B12) 1000 MCG/ML injection, INJECT 1 ML INTO THE MUSCLE EVERY 30 DAYS. (Patient taking differently: Inject 1,000 mcg into the skin every 30 (thirty) days.), Disp: 1 mL, Rfl: 11   cyclobenzaprine (FLEXERIL) 5 MG tablet, Take 1 tablet (5 mg total) by mouth 3 (three) times daily as needed for muscle spasms. (Patient taking differently: Take 2.5-5 mg by mouth 3 (three) times daily as needed for muscle spasms.), Disp: 45 tablet, Rfl: 3   FLUoxetine (PROZAC) 40 MG capsule, Take 1 capsule (40 mg total) by mouth daily., Disp: 90 capsule, Rfl: 3   gabapentin (NEURONTIN) 300 MG capsule, TAKE ONE CAPSULE (300 MG TOTAL) BY MOUTHTWO TIMES DAILY. (Patient taking differently: Take 300 mg by mouth 3 (three) times daily. TAKE ONE CAPSULE (300 MG TOTAL) BY MOUTHTWO TIMES DAILY.), Disp: 180 capsule, Rfl: 1   glucose blood (ONETOUCH ULTRA) test strip, Use as instructed, Disp: 100 each, Rfl: 12   glucose monitoring kit (FREESTYLE) monitoring kit, Use to check blood sugar BID as directed, Disp: 1 each, Rfl: 0   Lancets (ONETOUCH DELICA PLUS [id number]) MISC, 1 each by Does not apply route 2 (two) times a day., Disp: 100 each, Rfl: 3   metFORMIN (GLUCOPHAGE-XR) 500 MG 24 hr tablet, Take 1 tablet (500 mg total) by mouth daily with breakfast., Disp: 90 tablet, Rfl: 1   Misc. Devices MISC, Please provide supplies (needle, syringe, alcohol swabs) needed for patient to self-administer B-12 injections  monthly., Disp: 1 each, Rfl: 12   pantoprazole (PROTONIX) 40 MG tablet, TAKE ONE (1) TABLET 30 MINS BEFORE YOUR FIRST MEAL., Disp: 90 tablet, Rfl: 3   sacubitril-valsartan (ENTRESTO) 49-51 MG, Take 1 tablet by mouth 2 (two) times  daily. (Patient taking differently: Take 1 tablet by mouth daily.), Disp: 60 tablet, Rfl: 6   traMADol (ULTRAM) 50 MG tablet, Take 1 tablet (50 mg total) by mouth every 12 (twelve) hours as needed for severe pain., Disp: 30 tablet, Rfl: 0   Tuberculin-Allergy Syringes (ULTICARE TUBERCULIN SAFETY SYR) 25G X 1" 1 ML MISC, USE TO ADMINISTER INJECTABLE VITAMIN B12., Disp: 1 each, Rfl: 11   Review of Systems:   ROS Negative unless otherwise specified per HPI.  Vitals:   Vitals:   11/06/22 1408  BP: (!) 150/60  Pulse: (!) 118  Temp: (!) 97.5 F (36.4 C)  TempSrc: Temporal  SpO2: 95%  Weight: 134 lb (60.8 kg)  Height: '5\' 1"'$  (1.549 m)     Body mass index is 25.32 kg/m.  Physical Exam:   Physical Exam Vitals and nursing note reviewed.  Constitutional:      General: She is not in acute distress.    Appearance: She is well-developed. She is not ill-appearing or toxic-appearing.  Cardiovascular:     Rate and Rhythm: Regular rhythm. Tachycardia present.     Pulses: Normal pulses.     Heart sounds: Normal heart sounds, S1 normal and S2 normal.  Pulmonary:     Effort: Pulmonary effort is normal.     Breath sounds: Normal breath sounds.  Feet:     Comments: Left foot with erythematous toes without adequate capillary refill; exquisite tenderness throughout entire plantar aspect of foot -- unable to clean foot to further assess due to severity of pain Skin:    General: Skin is warm and dry.  Neurological:     Mental Status: She is alert.     GCS: GCS eye subscore is 4. GCS verbal subscore is 5. GCS motor subscore is 6.  Psychiatric:        Speech: Speech normal.        Behavior: Behavior normal. Behavior is cooperative.     Assessment and Plan:   Lower  limb ischemia; Left foot pain Concern for osteomyelitis I cannot even examine her foot appropriately due to severity of pain Given these findings, as well as tachycardia and recent blood work -- patient was instructed to return to ER She was agreeable, husband is transporting to [employer]  Severe anemia Unable to recheck blood work today as she is going to ER Continue to monitor Close follow-up with GI - has appt on 2/20  Pulmonary emphysema, unspecified emphysema type ([place]) Non compliant with oxygen, needs follow-up with pulm -- discussed Continue inhalers as prescribed Recommend oxygen at night  Time spent with patient today was 42 minutes which consisted of chart review, discussing diagnosis, work up, treatment answering questions and documentation.   Inda Coke, PA-C

## 2022-11-06 NOTE — ED Provider Notes (Signed)
[facility] Provider Note   CSN: [id number] Arrival date & time: 11/06/22  1558     History  Chief Complaint  Patient presents with   Foot Pain    Tammy Mcpherson is a 67 y.o. female with history significant for type 2 diabetes, peripheral vascular disease, COPD, CAD, hypertension recent hospitalization secondary to bleeding peptic ulcers and profound anemia requiring blood transfusions presenting for evaluation of left foot pain.  She describes having lesions on her foot, but she felt to be calluses prior to her recent hospitalization, however since being discharged on January 28 she has had increasing pain and redness of the left foot.  She is seen by her primary provider today for follow-up of her recent hospitalization and was sent here over concern of possible osteomyelitis versus left foot ischemia.  She denies fevers or chills, she has had no known injuries to her foot, she denies having reduced sensation in her feet.  She has had multiple toe amputations on both feet secondary to diabetic infections.  The history is provided by the patient.       Home Medications Prior to Admission medications   Medication Sig Start Date End Date Taking? Authorizing Provider  sulfamethoxazole-trimethoprim (BACTRIM DS) 800-160 MG tablet Take 1 tablet by mouth 2 (two) times daily for 10 days. 11/06/22 11/16/22 Yes Keedan Sample, Almyra Free, PA-C  acetaminophen (TYLENOL) 500 MG tablet Take 500 mg by mouth at bedtime as needed for moderate pain.    [provider]  albuterol (PROVENTIL) (2.5 MG/3ML) 0.083% nebulizer solution Take 3 mLs (2.5 mg total) by nebulization every 4 (four) hours as needed for wheezing or shortness of breath. 04/26/22   [name], NP  albuterol (VENTOLIN HFA) 108 (90 Base) MCG/ACT inhaler INHALE TWO PUFFS INTO THE LUNGS EVERY SIX HOURS AS NEEDED FOR WHEEZING OR SHORTNESS OF BREATH 04/26/22   [name], NP  ALPRAZolam Duanne Moron) 0.5 MG  tablet TAKE ONE TABLET (0.'5MG'$  TOTAL) BY MOUTH DAILY AS NEEDED FOR ANXIETY 04/25/22   [name], NP  aspirin 81 MG chewable tablet Chew 1 tablet (81 mg total) by mouth every morning. 10/30/22   Manuella Ghazi, Pratik D, DO  atorvastatin (LIPITOR) 80 MG tablet Take 1 tablet (80 mg total) by mouth daily. 06/07/22   Satira Sark, MD  blood glucose meter kit and supplies Dispense based on patient and insurance preference. Use up to four times daily as directed. (FOR ICD-10 E10.9, E11.9). 11/22/20   [name], NP  Budeson-Glycopyrrol-Formoterol (BREZTRI AEROSPHERE) 160-9-4.8 MCG/ACT AERO Inhale 2 puffs into the lungs 2 (two) times daily. 11/15/20   Garner Nash, DO  Cholecalciferol (VITAMIN D) 50 MCG (2000 UT) CAPS Take 1 capsule (2,000 Units total) by mouth daily. 03/15/22   Derek Jack, MD  clopidogrel (PLAVIX) 75 MG tablet Take 1 tablet (75 mg total) by mouth daily. TAKE ONE TABLET ('75MG'$  TOTAL) BY MOUTH DAILY WITH BREAKFAST 10/30/22   Manuella Ghazi, Pratik D, DO  Continuous Blood Gluc Receiver (FREESTYLE LIBRE 2 READER) DEVI Check blood glucose up to 4 times daily 10/11/22   Inda Coke, PA  Continuous Blood Gluc Sensor (FREESTYLE LIBRE 2 SENSOR) MISC Apply one sensor to the upper arm avery 14 days. 04/26/22   [name], NP  cyanocobalamin (VITAMIN B12) 1000 MCG/ML injection INJECT 1 ML INTO THE MUSCLE EVERY 30 DAYS. Patient taking differently: Inject 1,000 mcg into the skin every 30 (thirty) days. 08/09/22   Derek Jack, MD  cyclobenzaprine (FLEXERIL) 5  MG tablet Take 1 tablet (5 mg total) by mouth 3 (three) times daily as needed for muscle spasms. Patient taking differently: Take 2.5-5 mg by mouth 3 (three) times daily as needed for muscle spasms. 04/25/22   [name], NP  FLUoxetine (PROZAC) 40 MG capsule Take 1 capsule (40 mg total) by mouth daily. 04/26/22   [name], NP  gabapentin (NEURONTIN) 300 MG capsule TAKE ONE CAPSULE (300 MG TOTAL) BY MOUTHTWO TIMES  DAILY. Patient taking differently: Take 300 mg by mouth 3 (three) times daily. TAKE ONE CAPSULE (300 MG TOTAL) BY MOUTHTWO TIMES DAILY. 04/26/22   [name], NP  glucose blood [facility] ULTRA) test strip Use as instructed 02/26/19   Letta Median K, DO  glucose monitoring kit (FREESTYLE) monitoring kit Use to check blood sugar BID as directed 01/24/19   Cirigliano, Stanton Kidney K, DO  Lancets (ONETOUCH DELICA PLUS [id number]) MISC 1 each by Does not apply route 2 (two) times a day. 02/26/19   Cirigliano, Garvin Fila, DO  metFORMIN (GLUCOPHAGE-XR) 500 MG 24 hr tablet Take 1 tablet (500 mg total) by mouth daily with breakfast. 10/25/22   Inda Coke, PA  Misc. Devices MISC Please provide supplies (needle, syringe, alcohol swabs) needed for patient to self-administer B-12 injections monthly. 01/07/20   Derek Jack, MD  pantoprazole (PROTONIX) 40 MG tablet TAKE ONE (1) TABLET 30 MINS BEFORE YOUR FIRST MEAL. 04/26/22   [name], NP  sacubitril-valsartan (ENTRESTO) 49-51 MG Take 1 tablet by mouth 2 (two) times daily. Patient taking differently: Take 1 tablet by mouth daily. 06/07/22   Satira Sark, MD  traMADol (ULTRAM) 50 MG tablet Take 1 tablet (50 mg total) by mouth every 12 (twelve) hours as needed for severe pain. 10/25/22   Inda Coke, PA  Tuberculin-Allergy Syringes Flossie Buffy TUBERCULIN SAFETY SYR) 25G X 1" 1 ML MISC USE TO ADMINISTER INJECTABLE VITAMIN B12. 08/09/22   Derek Jack, MD      Allergies    Bee venom, Codeine, Penicillins, Bupropion, and Sudafed [pseudoephedrine hcl]    Review of Systems   Review of Systems  Constitutional:  Negative for fever.  HENT:  Negative for congestion and sore throat.   Eyes: Negative.   Respiratory:  Negative for chest tightness and shortness of breath.   Cardiovascular:  Negative for chest pain.  Gastrointestinal:  Negative for abdominal pain and nausea.  Genitourinary: Negative.   Musculoskeletal:  Positive for arthralgias.  Negative for joint swelling and neck pain.  Skin:  Positive for color change and wound. Negative for rash.  Neurological:  Negative for dizziness, weakness, light-headedness, numbness and headaches.  Psychiatric/Behavioral: Negative.    All other systems reviewed and are negative.   Physical Exam Updated Vital Signs BP (!) 148/62   Pulse (!) 116   Temp 97.8 F (36.6 C)   Resp 20   Wt 60.8 kg   BMI 25.32 kg/m  Physical Exam Vitals and nursing note reviewed.  Constitutional:      Appearance: She is well-developed.  HENT:     Head: Normocephalic and atraumatic.  Eyes:     Conjunctiva/sclera: Conjunctivae normal.  Cardiovascular:     Rate and Rhythm: Regular rhythm. Tachycardia present.     Heart sounds: Normal heart sounds.  Pulmonary:     Effort: Pulmonary effort is normal.     Breath sounds: Normal breath sounds. No wheezing.  Abdominal:     General: There is no distension.     Palpations: Abdomen is soft.  Musculoskeletal:  General: Swelling and tenderness present. No deformity. Normal range of motion.     Cervical back: Normal range of motion.     Left foot: Normal capillary refill. Swelling and bony tenderness present. Abnormal pulse.     Comments: Dorsalis pedal pulse not palpable, but easily located using bedside doppler.  Erythema present along the great and 3rd toes, mild edema,  no open wounds but multiple areas of hyperkeratosis and scaling, peeling skin.  Prior toe amputations.   No induration.   Skin:    General: Skin is warm and dry.  Neurological:     Mental Status: She is alert.     ED Results / Procedures / Treatments   Labs (all labs ordered are listed, but only abnormal results are displayed) Labs Reviewed  CBC WITH DIFFERENTIAL/PLATELET - Abnormal; Notable for the following components:      Result Value   WBC 14.8 (*)    RBC 3.60 (*)    Hemoglobin 9.3 (*)    HCT 31.0 (*)    MCH 25.8 (*)    RDW 18.0 (*)    Platelets 793 (*)    Neutro  Abs 11.4 (*)    Monocytes Absolute 1.4 (*)    Abs Immature Granulocytes 0.10 (*)    All other components within normal limits  BASIC METABOLIC PANEL - Abnormal; Notable for the following components:   Potassium 3.2 (*)    Glucose, Bld 160 (*)    All other components within normal limits    EKG None  Radiology MR FOOT LEFT WO CONTRAST  Result Date: 11/06/2022 CLINICAL DATA:  Evaluate for left foot osteomyelitis EXAM: MRI OF THE LEFT FOOT WITHOUT CONTRAST TECHNIQUE: Multiplanar, multisequence MR imaging of the left foot was performed. No intravenous contrast was administered. COMPARISON:  Radiograph dated November 06, 2022 FINDINGS: Bones/Joint/Cartilage Marked hallux valgus deformity. Prior amputation at the second and fifth metatarsophalangeal joints. Mild degenerative changes at the first metatarsophalangeal and interphalangeal joints. Ligaments Lisfranc and collateral ligaments are intact. Muscles and Tendons Plantar muscles and flexor and extensor tendons within normal limits. No evidence of tendon tear. Soft tissues Subcutaneous soft tissue edema about the dorsum of the foot without evidence of fluid collection or abscess. IMPRESSION: 1. Subcutaneous soft tissue edema about the dorsum of the foot without evidence of fluid collection or abscess. 2. No evidence of osteomyelitis. 3. Marked hallux valgus deformity. 4. Prior amputation at the second and fifth metatarsophalangeal joints. Electronically Signed   By: Keane Police D.O.   On: 11/06/2022 22:02   DG Foot Complete Left  Result Date: 11/06/2022 CLINICAL DATA:  Left foot pain. EXAM: LEFT FOOT - COMPLETE 3+ VIEW COMPARISON:  Radiographs 03/28/2019 FINDINGS: Prior resection of the second and fifth toes. Hallux valgus deformity with moderate osteoarthritis of the first metatarsal phalangeal joint. No fracture. No erosion, periostitis or bony destruction. There is a plantar calcaneal spur. Slight soft tissue edema over the dorsum of the foot. No  soft tissue gas or radiopaque foreign body. IMPRESSION: 1. No acute findings.  Prior resection of the second and fifth toes. 2. 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 3. Mild soft tissue edema over the dorsum of the foot. Electronically Signed   By: Keith Rake M.D.   On: 11/06/2022 17:15    Procedures Procedures    Medications Ordered in ED Medications - No data to display  ED Course/ Medical Decision Making/ A&P  Medical Decision Making Patient presenting with picture of cellulitis in her left foot, history of diabetes with prior multiple toe amputation secondary to gangrene, concerning for possibility of osteomyelitis and deeper diabetic infection.  Plain film imaging does not reflect osteomyelitis.  Therefore we proceeded to MRI imaging.  Unfortunately after patient returned from MRI she left AMA.  I continue to watch for her MRI results which were negative for osteomyelitis.  Patient was contacted by telephone, husband Mr. Topor who was at patient's bedside during her stay answered, patient was sleeping.  I relayed to him that she needs to be on an antibiotic and I have E scribed 1 to her pharmacy.  She should also have close follow-up with her primary MD within the next several days for recheck of this infection, particularly if she continues to have increasing symptoms.  He understands and agrees with plan and will get this antibiotic started.  Amount and/or Complexity of Data Reviewed Labs: ordered.    Details: Significant for leukocytosis of 14.8.  She also has a hemoglobin of 9.3, this hemoglobin is stable, it was 8.0 when she was discharged from her hospitalizationOn 10/29/2022. Radiology: ordered.    Details: Plain films indicating mild soft tissue edema without obvious osteomyelitis, osteoarthritis is present.  MRI per above.  Risk Prescription drug management.           Final Clinical  Impression(s) / ED Diagnoses Final diagnoses:  Cellulitis of left lower extremity    Rx / DC Orders ED Discharge Orders          Ordered    sulfamethoxazole-trimethoprim (BACTRIM DS) 800-160 MG tablet  2 times daily        11/06/22 2138              Evalee Jefferson, Hershal Coria 11/06/22 2359    Milton Ferguson, MD 11/07/22 1147

## 2022-11-06 NOTE — ED Triage Notes (Signed)
Pt in with L foot pain, ongoing for a few weeks. Pt sent by her PCP today for r/o osteomyelitis to L foot, hx of vascular issues and 2 L toes previously amputated. Pt did have recent hospital admission for anemia r/t bleeding stomach ulcers which required repair. VS in triage 117HR - 142/76 - 94%RA. Pt denies any current blood in stools. Unable to palpate L pedal pulse

## 2022-11-07 ENCOUNTER — Other Ambulatory Visit: Payer: Self-pay | Admitting: *Deleted

## 2022-11-07 DIAGNOSIS — I739 Peripheral vascular disease, unspecified: Secondary | ICD-10-CM

## 2022-11-10 ENCOUNTER — Other Ambulatory Visit: Payer: Self-pay | Admitting: Physician Assistant

## 2022-11-10 ENCOUNTER — Encounter: Payer: Self-pay | Admitting: Physician Assistant

## 2022-11-10 NOTE — Telephone Encounter (Signed)
Ald[place] Bar, called the pharmacy and spoke to [place]. He said insurance will only cover 7 days worth and was dispensed 14 tablets at a time x 2 and only has 2 pills left. Please send new Rx.

## 2022-11-13 NOTE — Telephone Encounter (Signed)
See other message

## 2022-11-21 ENCOUNTER — Ambulatory Visit (HOSPITAL_COMMUNITY): Payer: PPO

## 2022-11-21 ENCOUNTER — Ambulatory Visit: Payer: PPO

## 2022-11-23 ENCOUNTER — Telehealth: Payer: Self-pay | Admitting: Physician Assistant

## 2022-11-23 NOTE — Telephone Encounter (Signed)
Called patient to schedule Medicare Annual Wellness Visit (AWV). Left message for patient to call back and schedule Medicare Annual Wellness Visit (AWV).  Last date of AWV: N/A  Please schedule an appointment at any time with Otila Kluver, [facility].  If any questions, please contact me at [phone].  Thank you ,  Shaune Pollack [facility] AWV TEAM Direct Dial [phone]

## 2022-11-28 ENCOUNTER — Encounter: Payer: Self-pay | Admitting: Physician Assistant

## 2022-11-28 ENCOUNTER — Other Ambulatory Visit: Payer: Self-pay | Admitting: Physician Assistant

## 2022-11-28 ENCOUNTER — Telehealth: Payer: Self-pay | Admitting: Physician Assistant

## 2022-11-28 MED ORDER — TRAMADOL HCL 50 MG PO TABS: 50.0000 mg | ORAL_TABLET | Freq: Two times a day (BID) | ORAL | 0 refills | Status: DC | PRN

## 2022-11-28 NOTE — Telephone Encounter (Signed)
Called patient to schedule Medicare Annual Wellness Visit (AWV). Left message for patient to call back and schedule Medicare Annual Wellness Visit (AWV).  Last date of AWV: N/A  Please schedule an appointment at any time with Otila Kluver, [facility].  If any questions, please contact me at [phone].  Thank you ,  Shaune Pollack [facility] AWV TEAM Direct Dial [phone]

## 2022-11-29 ENCOUNTER — Encounter: Payer: Self-pay | Admitting: Physician Assistant

## 2022-11-29 ENCOUNTER — Telehealth: Payer: Self-pay | Admitting: Physician Assistant

## 2022-11-29 ENCOUNTER — Other Ambulatory Visit: Payer: Self-pay | Admitting: Physician Assistant

## 2022-11-29 MED ORDER — TRAMADOL HCL 50 MG PO TABS: 50.0000 mg | ORAL_TABLET | Freq: Two times a day (BID) | ORAL | 0 refills | Status: DC | PRN

## 2022-11-29 NOTE — Addendum Note (Signed)
Addended by: Marian Sorrow on: 11/29/2022 07:03 AM   Modules accepted: Orders

## 2022-11-29 NOTE — Progress Notes (Signed)
Tammy Mcpherson, please resend Rx for Tramadol did not go through to pharmacy.

## 2022-11-29 NOTE — Telephone Encounter (Signed)
Spoke to pt told her the fax machine is down at the pharmacy they are not able to receive any faxes. Told her she will need to come by the office to pick up hard copy of Rx for Tramadol. I will put it at the front desk. Pt verbalized understanding.

## 2022-11-29 NOTE — Telephone Encounter (Signed)
Caller states: - [facility] stated their electronic rx system is down - They are only able to get meds filled by having rx order sent via fax or phone.   Caller requests: -Tramadol be re-sent to pharmacy via fax or phone

## 2022-12-04 NOTE — Progress Notes (Unsigned)
HISTORY AND PHYSICAL     CC:  follow up. Requesting Provider:  Inda Coke, PA  HPI: This is a 67 y.o. female  who with dry gangrene of the right second toe and ulceration of the right great toe. She has hx of RLE arteriogram with right popliteal angioplasty and stenting and right AT angioplasty by Dr. Stanford Breed on 05/19/2021 for rest pain and gangrene.   Her Arteriogram findings of the RLE were : Common femoral artery: patent without flow limiting stenosis  Profunda femoris artery: patent without flow limiting stenosis   Superficial femoral artery: diffusely diseased Popliteal artery: above knee diffusely diseased (>95% stenosis). Behind knee and below knee patent. Anterior tibial artery: chronic occlusion at the origin causing critical stenosis of terminal popliteal / proximal TP trunk. Patent beyond the occlusion. Tibioperoneal trunk: patent without flow limiting stenosis Peroneal artery: patent without flow limiting stenosis Posterior tibial artery: patent without flow limiting stenosis Pedal circulation: pedal arch intact   On 05/27/2021, her duplex revealed adequate perfusion following recent revascularization and she was scheduled for right 1st and 2nd toe amputation, which she underwent on 06/01/2021 by Dr. Stanford Breed.   He did have to explant a screw in the right great toe.   Pt was last seen 09/06/2021 and at that time, her amputation sites were healing.  She was still smoking.  She was compliant with her asa/statin/Plavix.  ABI on the right was improved.  She was scheduled for 9 month follow up.   The pt returns today for follow up.  ***  The pt *** on a statin for cholesterol management.    The pt *** on an aspirin.    Other AC:  *** The pt *** on *** for hypertension.  The pt does *** have diabetes. Tobacco hx:  ***  Pt does *** have family hx of AAA.  Past Medical History:  Diagnosis Date   Anemia 04/30/2016   Anxiety    Arthritis    Cardiomyopathy ([place])    a. EF  40-45% by echo in 04/2016 b. Improved to 60-65% by repeat imaging in 2018   COPD (chronic obstructive pulmonary disease) (HCC)    Coronary artery calcification seen on CT scan    Essential hypertension    GERD (gastroesophageal reflux disease)    Headache    History of bronchitis    History of GI bleed    History of hiatal hernia    Hyperlipidemia    Iron deficiency anemia    Peripheral vascular disease (HCC)    Pollen allergy    PSVT (paroxysmal supraventricular tachycardia)    PVC's (premature ventricular contractions)    Type 2 diabetes mellitus ([place])    Vitamin B12 deficiency     Past Surgical History:  Procedure Laterality Date   ABDOMINAL AORTOGRAM W/LOWER EXTREMITY Bilateral 05/19/2021   Procedure: ABDOMINAL AORTOGRAM W/LOWER EXTREMITY;  Surgeon: [name], MD;  Location: [facility];  Service: Cardiovascular;  Laterality: Bilateral;   ABDOMINAL HYSTERECTOMY     polyps  about age 39   AIKEN OSTEOTOMY Right 07/10/2013   Procedure: Barbie Banner OSTEOTOMY RIGHT FOOT;  Surgeon: Marcheta Grammes, DPM;  Location: [facility];  Service: Orthopedics;  Laterality: Right;   AMPUTATION Left 05/09/2017   Procedure: AMPUTATION 5TH TOE LEFT FOOT;  Surgeon: [name], DPM;  Location: [facility];  Service: Podiatry;  Laterality: Left;   AMPUTATION Left 06/13/2017   Procedure: AMPUTATION 2ND TOE LEFT FOOT;  Surgeon: [name], DPM;  Location: [facility];  Service: Podiatry;  Laterality: Left;   AMPUTATION Right 06/01/2021   Procedure: Right great toe and Second Toe Amputation;  Surgeon: [name], MD;  Location: [facility];  Service: Vascular;  Laterality: Right;   BIOPSY  10/27/2022   Procedure: BIOPSY;  Surgeon: Daneil Dolin, MD;  [facility];  Service: Endoscopy;;   BUNIONECTOMY Right 07/10/2013   Procedure: Maryfrances Bunnell RIGHT FOOT;  Surgeon: Marcheta Grammes, DPM;  Location: [facility];  Service: Orthopedics;  Laterality: Right;   CHOLECYSTECTOMY N/A  08/22/2017   Procedure: LAPAROSCOPIC CHOLECYSTECTOMY;  Surgeon: Virl Cagey, MD;  Location: [facility];  Service: General;  Laterality: N/A;   COLONOSCOPY N/A 07/07/2016   Dr. Oneida Alar; redundant left colon, diverticulosis at hepatic flexure, non-bleeding internal hemorrhoids   ENTEROSCOPY N/A 10/27/2022   Procedure: ENTEROSCOPY;  Surgeon: Daneil Dolin, MD;  [facility];  Service: Endoscopy;  Laterality: N/A;   ESOPHAGOGASTRODUODENOSCOPY N/A 07/07/2016   Dr. Oneida Alar: many non-bleeding cratered gastric ulcers without stigmata of bleeding in gastric antrum. four 2-3 mm angioectasias without bleeding in duodenal bulb and second portion of duodenum s/p APC. Chroni gastritis on path.    ESOPHAGOGASTRODUODENOSCOPY N/A 08/03/2017   Dr. Oneida Alar: erosive gastritis, AVMs. Found a single non-bleeding angioectasia in stomach, s/p APC therapy. Four non-bleeding angioectasias in duodenum s/p APC. Non-bleeding erosive gastropathy   HOT HEMOSTASIS  10/27/2022   Procedure: HOT HEMOSTASIS (ARGON PLASMA COAGULATION/BICAP);  Surgeon: Daneil Dolin, MD;  [facility];  Service: Endoscopy;;   IR ANGIOGRAM EXTREMITY LEFT  04/24/2017   IR FEM POP ART ATHERECT INC PTA MOD SED  04/24/2017   IR INFUSION THROMBOL ARTERIAL INITIAL (MS)  04/24/2017   IR RADIOLOGIST EVAL & MGMT  12/05/2016   IR US GUIDE VASC ACCESS RIGHT  04/24/2017   LIVER BIOPSY N/A 08/22/2017   Procedure: LIVER BIOPSY;  Surgeon: Virl Cagey, MD;  [facility]: [facility];  Service: General;  Laterality: N/A;   METATARSAL HEAD EXCISION Right 07/10/2013   Procedure: METATARSAL HEAD RESECTION OF DIGITS 2 AND 3 RIGHT FOOT;  Surgeon: Marcheta Grammes, DPM;  Location: [facility];  Service: Orthopedics;  Laterality: Right;   PERIPHERAL VASCULAR INTERVENTION Right 05/19/2021   Procedure: PERIPHERAL VASCULAR INTERVENTION;  Surgeon: [name], MD;  Location: [facility];  Service: Cardiovascular;  Laterality: Right;   PROXIMAL  INTERPHALANGEAL FUSION (PIP) Right 07/10/2013   Procedure: ARTHRODESIS PIPJ  2ND DIGIT RIGHT FOOT;  Surgeon: Marcheta Grammes, DPM;  Location: [facility];  Service: Orthopedics;  Laterality: Right;    Allergies  Allergen Reactions   Bee Venom Swelling   Codeine Anaphylaxis    Tongue swelled   Penicillins Swelling and Rash    Has patient had a PCN reaction causing immediate rash, facial/tongue/throat swelling, SOB or lightheadedness with hypotension: No, delayed Has patient had a PCN reaction causing severe rash involving mucus membranes or skin necrosis: No Has patient had a PCN reaction that required hospitalization No Has patient had a PCN reaction occurring within the last 10 years: No If all of the above answers are "NO", then may proceed with Cephalosporin use.    Bupropion Other (See Comments)    "Made my skin crawl"   Sudafed [Pseudoephedrine Hcl] Rash    Current Outpatient Medications  Medication Sig Dispense Refill   acetaminophen (TYLENOL) 500 MG tablet Take 500 mg by mouth at bedtime as needed for moderate pain.     albuterol (PROVENTIL) (2.5  MG/3ML) 0.083% nebulizer solution Take 3 mLs (2.5 mg total) by nebulization every 4 (four) hours as needed for wheezing or shortness of breath. 75 mL 5   albuterol (VENTOLIN HFA) 108 (90 Base) MCG/ACT inhaler INHALE TWO PUFFS INTO THE LUNGS EVERY SIX HOURS AS NEEDED FOR WHEEZING OR SHORTNESS OF BREATH 8.5 g 5   ALPRAZolam (XANAX) 0.5 MG tablet TAKE ONE TABLET (0.'5MG'$  TOTAL) BY MOUTH DAILY AS NEEDED FOR ANXIETY 30 tablet 2   aspirin 81 MG chewable tablet Chew 1 tablet (81 mg total) by mouth every morning. 30 tablet 0   atorvastatin (LIPITOR) 80 MG tablet Take 1 tablet (80 mg total) by mouth daily. 90 tablet 3   blood glucose meter kit and supplies Dispense based on patient and insurance preference. Use up to four times daily as directed. (FOR ICD-10 E10.9, E11.9). 1 each 0   Budeson-Glycopyrrol-Formoterol (BREZTRI AEROSPHERE) 160-9-4.8  MCG/ACT AERO Inhale 2 puffs into the lungs 2 (two) times daily. 11.8 g 12   Cholecalciferol (VITAMIN D) 50 MCG (2000 UT) CAPS Take 1 capsule (2,000 Units total) by mouth daily. 90 capsule 4   clopidogrel (PLAVIX) 75 MG tablet Take 1 tablet (75 mg total) by mouth daily. TAKE ONE TABLET ('75MG'$  TOTAL) BY MOUTH DAILY WITH BREAKFAST 90 tablet 1   Continuous Blood Gluc Receiver (FREESTYLE LIBRE 2 READER) DEVI Check blood glucose up to 4 times daily 1 each 0   Continuous Blood Gluc Sensor (FREESTYLE LIBRE 2 SENSOR) MISC Apply one sensor to the upper arm avery 14 days. 6 each 1   cyanocobalamin (VITAMIN B12) 1000 MCG/ML injection INJECT 1 ML INTO THE MUSCLE EVERY 30 DAYS. (Patient taking differently: Inject 1,000 mcg into the skin every 30 (thirty) days.) 1 mL 11   cyclobenzaprine (FLEXERIL) 5 MG tablet Take 1 tablet (5 mg total) by mouth 3 (three) times daily as needed for muscle spasms. (Patient taking differently: Take 2.5-5 mg by mouth 3 (three) times daily as needed for muscle spasms.) 45 tablet 3   FLUoxetine (PROZAC) 40 MG capsule Take 1 capsule (40 mg total) by mouth daily. 90 capsule 3   gabapentin (NEURONTIN) 300 MG capsule TAKE ONE CAPSULE (300 MG TOTAL) BY MOUTHTWO TIMES DAILY. (Patient taking differently: Take 300 mg by mouth 3 (three) times daily. TAKE ONE CAPSULE (300 MG TOTAL) BY MOUTHTWO TIMES DAILY.) 180 capsule 1   glucose blood (ONETOUCH ULTRA) test strip Use as instructed 100 each 12   glucose monitoring kit (FREESTYLE) monitoring kit Use to check blood sugar BID as directed 1 each 0   Lancets (ONETOUCH DELICA PLUS 123XX123) MISC 1 each by Does not apply route 2 (two) times a day. 100 each 3   metFORMIN (GLUCOPHAGE-XR) 500 MG 24 hr tablet Take 1 tablet (500 mg total) by mouth daily with breakfast. 90 tablet 1   Misc. Devices MISC Please provide supplies (needle, syringe, alcohol swabs) needed for patient to self-administer B-12 injections monthly. 1 each 12   pantoprazole (PROTONIX) 40 MG  tablet TAKE ONE (1) TABLET 30 MINS BEFORE YOUR FIRST MEAL. 90 tablet 3   sacubitril-valsartan (ENTRESTO) 49-51 MG Take 1 tablet by mouth 2 (two) times daily. (Patient taking differently: Take 1 tablet by mouth daily.) 60 tablet 6   traMADol (ULTRAM) 50 MG tablet Take 1 tablet (50 mg total) by mouth every 12 (twelve) hours as needed. 30 tablet 0   Tuberculin-Allergy Syringes (ULTICARE TUBERCULIN SAFETY SYR) 25G X 1" 1 ML MISC USE TO ADMINISTER INJECTABLE VITAMIN B12. 1  each 11   No current facility-administered medications for this visit.    Family History  Adopted: Yes  Problem Relation Age of Onset   Hypertension Father    Coronary artery disease Sister    Ulcers Maternal Grandmother    Colon cancer Neg Hx        unknown, was adopted    Social History   Socioeconomic History   Marital status: Married    Spouse name: [name]   Number of children: 1   Years of education: 12   Highest education level: Not on file  Occupational History   Occupation: disabled    Comment: heart  Tobacco Use   Smoking status: Light Smoker    Packs/day: 0.50    Years: 44.00    Total pack years: 22.00    Types: Cigarettes    Start date: 05/10/1975    Passive exposure: Never   Smokeless tobacco: Never   Tobacco comments:    2 cigarettes per day 12/10/2019  Vaping Use   Vaping Use: Former  Substance and Sexual Activity   Alcohol use: Not Currently    Alcohol/week: 2.0 standard drinks of alcohol    Types: 2 Glasses of wine per week   Drug use: No   Sexual activity: Yes    Birth control/protection: Surgical  Other Topics Concern   Not on file  Social History Narrative   Disabled   Lives with husband [name]   Two dogs   Social Determinants of Health   Financial Resource Strain: Not on file  Food Insecurity: No Food Insecurity (10/30/2022)   Hunger Vital Sign    Worried About Running Out of Food in the Last Year: Never true    Ran Out of Food in the Last Year: Never true  Transportation Needs:  No Transportation Needs (10/30/2022)   PRAPARE - Hydrologist (Medical): No    Lack of Transportation (Non-Medical): No  Physical Activity: Not on file  Stress: Not on file  Social Connections: Not on file  Intimate Partner Violence: Not At Risk (10/05/2020)   Humiliation, Afraid, Rape, and Kick questionnaire    Fear of Current or Ex-Partner: No    Emotionally Abused: No    Physically Abused: No    Sexually Abused: No     REVIEW OF SYSTEMS:  *** '[X]'$  denotes positive finding, '[ ]'$  denotes negative finding Cardiac  Comments:  Chest pain or chest pressure:    Shortness of breath upon exertion:    Short of breath when lying flat:    Irregular heart rhythm:        Vascular    Pain in calf, thigh, or hip brought on by ambulation:    Pain in feet at night that wakes you up from your sleep:     Blood clot in your veins:    Leg swelling:         Pulmonary    Oxygen at home:    Productive cough:     Wheezing:         Neurologic    Sudden weakness in arms or legs:     Sudden numbness in arms or legs:     Sudden onset of difficulty speaking or slurred speech:    Temporary loss of vision in one eye:     Problems with dizziness:         Gastrointestinal    Blood in stool:     Vomited blood:  Genitourinary    Burning when urinating:     Blood in urine:        Psychiatric    Major depression:         Hematologic    Bleeding problems:    Problems with blood clotting too easily:        Skin    Rashes or ulcers:        Constitutional    Fever or chills:      PHYSICAL EXAMINATION:  ***  General:  WDWN in NAD; vital signs documented above Gait: Not observed HENT: WNL, normocephalic Pulmonary: normal non-labored breathing , without wheezing Cardiac: {Desc; regular/irreg:14544} HR, {With/Without:20273} carotid bruit*** Abdomen: soft, NT; aortic pulse is *** palpable Skin: {With/Without:20273} rashes Vascular Exam/Pulses:  Right Left   Radial {Exam; arterial pulse strength 0-4:30167} {Exam; arterial pulse strength 0-4:30167}  Femoral {Exam; arterial pulse strength 0-4:30167} {Exam; arterial pulse strength 0-4:30167}  Popliteal {Exam; arterial pulse strength 0-4:30167} {Exam; arterial pulse strength 0-4:30167}  DP {Exam; arterial pulse strength 0-4:30167} {Exam; arterial pulse strength 0-4:30167}  PT {Exam; arterial pulse strength 0-4:30167} {Exam; arterial pulse strength 0-4:30167}  Peroneal *** ***   Extremities: {With/Without:20273} ischemic changes, {With/Without:20273} Gangrene , {With/Without:20273} cellulitis; {With/Without:20273} open wounds Musculoskeletal: no muscle wasting or atrophy  Neurologic: A&O X 3 Psychiatric:  The pt has {Desc; normal/abnormal:11317::"Normal"} affect.   Non-Invasive Vascular Imaging:   ABI's/TBI's on 12/05/2022: Right:  *** - Great toe pressure: *** Left:  *** - Great toe pressure: ***   Previous ABI's/TBI's on ***: Right:  *** - Great toe pressure: *** Left:  *** - Great toe pressure:  ***  Previous arterial duplex on ***: ***    ASSESSMENT/PLAN:: 67 y.o. female here for follow up for PAD with hx of  hx of RLE arteriogram with right popliteal angioplasty and stenting and right AT angioplasty by Dr. Stanford Breed on 05/19/2021 for rest pain and gangrene.     On 06/01/2021, she underwent right 1st and 2nd toe amputation by Dr. Stanford Breed.   He did have to explant a screw in the right great toe.    -*** -continue *** -pt will f/u in *** with ***.   Leontine Locket, [facility] [phone]  Clinic MD:   Stanford Breed

## 2022-12-05 ENCOUNTER — Ambulatory Visit (HOSPITAL_COMMUNITY)
Admission: RE | Admit: 2022-12-05 | Discharge: 2022-12-05 | Disposition: A | Discharge status: Home / Self Care | Payer: PPO | Source: Ambulatory Visit | Attending: Vascular Surgery | Admitting: Vascular Surgery

## 2022-12-05 ENCOUNTER — Other Ambulatory Visit: Payer: Self-pay

## 2022-12-05 ENCOUNTER — Telehealth: Payer: Self-pay | Admitting: Physician Assistant

## 2022-12-05 ENCOUNTER — Ambulatory Visit (INDEPENDENT_AMBULATORY_CARE_PROVIDER_SITE_OTHER): Payer: PPO | Admitting: Physician Assistant

## 2022-12-05 VITALS — BP 105/45 | HR 102 | Temp 97.8°F | Ht 61.0 in | Wt 134.0 lb

## 2022-12-05 DIAGNOSIS — I70223 Atherosclerosis of native arteries of extremities with rest pain, bilateral legs: Secondary | ICD-10-CM

## 2022-12-05 DIAGNOSIS — I739 Peripheral vascular disease, unspecified: Secondary | ICD-10-CM | POA: Diagnosis present

## 2022-12-05 LAB — VAS US ABI WITH/WO TBI
Left ABI: ABSENT
Right ABI: 0.22

## 2022-12-05 NOTE — Telephone Encounter (Signed)
Copied from [place] [id number]. Topic: Medicare AWV >> Dec 05, 2022 12:54 PM Gillis Santa wrote: Reason for CRM: Called patient to schedule Medicare Annual Wellness Visit (AWV). Left message for patient to call back and schedule Medicare Annual Wellness Visit (AWV).  Last date of AWV: N/A  Please schedule an appointment at any time with [phone].  If any questions, please contact me at [place], [place].  Thank you ,  Shaune Pollack [facility] AWV TEAM Direct Dial [phone]

## 2022-12-08 ENCOUNTER — Ambulatory Visit (HOSPITAL_COMMUNITY): Payer: PPO

## 2022-12-08 ENCOUNTER — Encounter (HOSPITAL_COMMUNITY)
Admission: RE | Disposition: A | Discharge status: Home Health | Payer: Self-pay | Source: Home / Self Care | Attending: Internal Medicine

## 2022-12-08 ENCOUNTER — Other Ambulatory Visit: Payer: Self-pay

## 2022-12-08 ENCOUNTER — Emergency Department (HOSPITAL_COMMUNITY)
Admission: EM | Admit: 2022-12-08 | Discharge: 2022-12-08 | Disposition: A | Discharge status: Home / Self Care | Payer: PPO | Source: Home / Self Care

## 2022-12-08 ENCOUNTER — Inpatient Hospital Stay (HOSPITAL_COMMUNITY)
Admission: RE | Admit: 2022-12-08 | Discharge: 2022-12-10 | DRG: 377 | Disposition: A | Discharge status: Home Health | Payer: PPO | Attending: Internal Medicine | Admitting: Internal Medicine

## 2022-12-08 ENCOUNTER — Encounter (HOSPITAL_COMMUNITY): Payer: Self-pay | Admitting: Vascular Surgery

## 2022-12-08 DIAGNOSIS — D649 Anemia, unspecified: Principal | ICD-10-CM

## 2022-12-08 DIAGNOSIS — Z7982 Long term (current) use of aspirin: Secondary | ICD-10-CM

## 2022-12-08 DIAGNOSIS — D509 Iron deficiency anemia, unspecified: Secondary | ICD-10-CM | POA: Diagnosis present

## 2022-12-08 DIAGNOSIS — K219 Gastro-esophageal reflux disease without esophagitis: Secondary | ICD-10-CM | POA: Diagnosis not present

## 2022-12-08 DIAGNOSIS — K31811 Angiodysplasia of stomach and duodenum with bleeding: Principal | ICD-10-CM | POA: Diagnosis present

## 2022-12-08 DIAGNOSIS — I5041 Acute combined systolic (congestive) and diastolic (congestive) heart failure: Secondary | ICD-10-CM | POA: Diagnosis present

## 2022-12-08 DIAGNOSIS — Z7984 Long term (current) use of oral hypoglycemic drugs: Secondary | ICD-10-CM

## 2022-12-08 DIAGNOSIS — Z91030 Bee allergy status: Secondary | ICD-10-CM

## 2022-12-08 DIAGNOSIS — J449 Chronic obstructive pulmonary disease, unspecified: Secondary | ICD-10-CM | POA: Diagnosis present

## 2022-12-08 DIAGNOSIS — Z8711 Personal history of peptic ulcer disease: Secondary | ICD-10-CM

## 2022-12-08 DIAGNOSIS — Z9049 Acquired absence of other specified parts of digestive tract: Secondary | ICD-10-CM

## 2022-12-08 DIAGNOSIS — Z720 Tobacco use: Secondary | ICD-10-CM

## 2022-12-08 DIAGNOSIS — I998 Other disorder of circulatory system: Secondary | ICD-10-CM

## 2022-12-08 DIAGNOSIS — I10 Essential (primary) hypertension: Secondary | ICD-10-CM

## 2022-12-08 DIAGNOSIS — Z89421 Acquired absence of other right toe(s): Secondary | ICD-10-CM

## 2022-12-08 DIAGNOSIS — Z880 Allergy status to penicillin: Secondary | ICD-10-CM

## 2022-12-08 DIAGNOSIS — Z66 Do not resuscitate: Secondary | ICD-10-CM | POA: Diagnosis present

## 2022-12-08 DIAGNOSIS — Z8249 Family history of ischemic heart disease and other diseases of the circulatory system: Secondary | ICD-10-CM

## 2022-12-08 DIAGNOSIS — I5043 Acute on chronic combined systolic (congestive) and diastolic (congestive) heart failure: Secondary | ICD-10-CM

## 2022-12-08 DIAGNOSIS — Z79899 Other long term (current) drug therapy: Secondary | ICD-10-CM

## 2022-12-08 DIAGNOSIS — Z885 Allergy status to narcotic agent status: Secondary | ICD-10-CM

## 2022-12-08 DIAGNOSIS — Z7951 Long term (current) use of inhaled steroids: Secondary | ICD-10-CM

## 2022-12-08 DIAGNOSIS — I70223 Atherosclerosis of native arteries of extremities with rest pain, bilateral legs: Secondary | ICD-10-CM

## 2022-12-08 DIAGNOSIS — Z90710 Acquired absence of both cervix and uterus: Secondary | ICD-10-CM

## 2022-12-08 DIAGNOSIS — I739 Peripheral vascular disease, unspecified: Secondary | ICD-10-CM

## 2022-12-08 DIAGNOSIS — Z95820 Peripheral vascular angioplasty status with implants and grafts: Secondary | ICD-10-CM

## 2022-12-08 DIAGNOSIS — D62 Acute posthemorrhagic anemia: Secondary | ICD-10-CM | POA: Diagnosis present

## 2022-12-08 DIAGNOSIS — E119 Type 2 diabetes mellitus without complications: Secondary | ICD-10-CM

## 2022-12-08 DIAGNOSIS — Z888 Allergy status to other drugs, medicaments and biological substances status: Secondary | ICD-10-CM

## 2022-12-08 DIAGNOSIS — Z7902 Long term (current) use of antithrombotics/antiplatelets: Secondary | ICD-10-CM

## 2022-12-08 DIAGNOSIS — Z89422 Acquired absence of other left toe(s): Secondary | ICD-10-CM

## 2022-12-08 DIAGNOSIS — K5521 Angiodysplasia of colon with hemorrhage: Secondary | ICD-10-CM | POA: Diagnosis present

## 2022-12-08 DIAGNOSIS — I110 Hypertensive heart disease with heart failure: Secondary | ICD-10-CM | POA: Diagnosis present

## 2022-12-08 DIAGNOSIS — F17210 Nicotine dependence, cigarettes, uncomplicated: Secondary | ICD-10-CM | POA: Diagnosis present

## 2022-12-08 DIAGNOSIS — E785 Hyperlipidemia, unspecified: Secondary | ICD-10-CM | POA: Diagnosis present

## 2022-12-08 DIAGNOSIS — E11621 Type 2 diabetes mellitus with foot ulcer: Secondary | ICD-10-CM | POA: Diagnosis present

## 2022-12-08 DIAGNOSIS — K254 Chronic or unspecified gastric ulcer with hemorrhage: Secondary | ICD-10-CM | POA: Diagnosis present

## 2022-12-08 DIAGNOSIS — K580 Irritable bowel syndrome with diarrhea: Secondary | ICD-10-CM | POA: Diagnosis present

## 2022-12-08 LAB — RETICULOCYTES
Immature Retic Fract: 39.4 % — ABNORMAL HIGH (ref 2.3–15.9)
RBC.: 2.03 MIL/uL — ABNORMAL LOW (ref 3.87–5.11)
Retic Count, Absolute: 33.7 10*3/uL (ref 19.0–186.0)
Retic Ct Pct: 1.7 % (ref 0.4–3.1)

## 2022-12-08 LAB — PREPARE RBC (CROSSMATCH)

## 2022-12-08 LAB — LACTIC ACID, PLASMA: Lactic Acid, Venous: 3.8 mmol/L (ref 0.5–1.9)

## 2022-12-08 LAB — BASIC METABOLIC PANEL
Anion gap: 16 — ABNORMAL HIGH (ref 5–15)
BUN: 7 mg/dL — ABNORMAL LOW (ref 8–23)
CO2: 23 mmol/L (ref 22–32)
Calcium: 8.6 mg/dL — ABNORMAL LOW (ref 8.9–10.3)
Chloride: 96 mmol/L — ABNORMAL LOW (ref 98–111)
Creatinine, Ser: 0.84 mg/dL (ref 0.44–1.00)
GFR, Estimated: >60 mL/min (ref >60)
Glucose, Bld: 191 mg/dL — ABNORMAL HIGH (ref 70–99)
Potassium: 3.3 mmol/L — ABNORMAL LOW (ref 3.5–5.1)
Sodium: 135 mmol/L (ref 135–145)

## 2022-12-08 LAB — POC OCCULT BLOOD, ED: Fecal Occult Bld: NEGATIVE

## 2022-12-08 LAB — POCT I-STAT, CHEM 8
BUN: 7 mg/dL — ABNORMAL LOW (ref 8–23)
Calcium, Ion: 1.03 mmol/L — ABNORMAL LOW (ref 1.15–1.40)
Chloride: 95 mmol/L — ABNORMAL LOW (ref 98–111)
Creatinine, Ser: 0.6 mg/dL (ref 0.44–1.00)
Glucose, Bld: 190 mg/dL — ABNORMAL HIGH (ref 70–99)
HCT: 17 % — ABNORMAL LOW (ref 36.0–46.0)
Hemoglobin: 5.8 g/dL — CL (ref 12.0–15.0)
Potassium: 3.3 mmol/L — ABNORMAL LOW (ref 3.5–5.1)
Sodium: 133 mmol/L — ABNORMAL LOW (ref 135–145)
TCO2: 25 mmol/L (ref 22–32)

## 2022-12-08 LAB — PROTIME-INR
INR: 1.2 (ref 0.8–1.2)
Prothrombin Time: 15.2 seconds (ref 11.4–15.2)

## 2022-12-08 LAB — CBC
HCT: 16.5 % — ABNORMAL LOW (ref 36.0–46.0)
Hemoglobin: 4.4 g/dL — CL (ref 12.0–15.0)
MCH: 19.9 pg — ABNORMAL LOW (ref 26.0–34.0)
MCHC: 26.7 g/dL — ABNORMAL LOW (ref 30.0–36.0)
MCV: 74.7 fL — ABNORMAL LOW (ref 80.0–100.0)
Platelets: 824 10*3/uL — ABNORMAL HIGH (ref 150–400)
RBC: 2.21 MIL/uL — ABNORMAL LOW (ref 3.87–5.11)
RDW: 19.9 % — ABNORMAL HIGH (ref 11.5–15.5)
WBC: 15.2 10*3/uL — ABNORMAL HIGH (ref 4.0–10.5)
nRBC: 0.4 % — ABNORMAL HIGH (ref 0.0–0.2)

## 2022-12-08 LAB — HEMOGLOBIN AND HEMATOCRIT, BLOOD
HCT: 15.4 % — ABNORMAL LOW (ref 36.0–46.0)
Hemoglobin: 4 g/dL — CL (ref 12.0–15.0)

## 2022-12-08 LAB — APTT: aPTT: 34 seconds (ref 24–36)

## 2022-12-08 LAB — IRON AND TIBC
Iron: 14 ug/dL — ABNORMAL LOW (ref 28–170)
Saturation Ratios: 4 % — ABNORMAL LOW (ref 10.4–31.8)
TIBC: 402 ug/dL (ref 250–450)
UIBC: 388 ug/dL

## 2022-12-08 LAB — VITAMIN B12: Vitamin B-12: 588 pg/mL (ref 180–914)

## 2022-12-08 LAB — FOLATE: Folate: 8.2 ng/mL (ref >5.9)

## 2022-12-08 LAB — FERRITIN: Ferritin: 11 ng/mL (ref 11–307)

## 2022-12-08 SURGERY — ABDOMINAL AORTOGRAM W/LOWER EXTREMITY
Anesthesia: LOCAL

## 2022-12-08 MED ORDER — GABAPENTIN 300 MG PO CAPS
300.0000 mg | ORAL_CAPSULE | Freq: Three times a day (TID) | ORAL | Status: DC
  Administered 2022-12-09 – 2022-12-10 (×3): 300 mg via ORAL
  Filled 2022-12-08 (×3): qty 1

## 2022-12-08 MED ORDER — HYDROMORPHONE HCL 1 MG/ML IJ SOLN
0.5000 mg | INTRAMUSCULAR | Status: DC | PRN
  Administered 2022-12-08 – 2022-12-10 (×5): 1 mg via INTRAVENOUS
  Filled 2022-12-08 (×5): qty 1

## 2022-12-08 MED ORDER — ATORVASTATIN CALCIUM 80 MG PO TABS
80.0000 mg | ORAL_TABLET | Freq: Every day | ORAL | Status: DC
  Administered 2022-12-08 – 2022-12-10 (×3): 80 mg via ORAL
  Filled 2022-12-08 (×3): qty 1

## 2022-12-08 MED ORDER — SODIUM CHLORIDE 0.9% IV SOLUTION: Freq: Once | INTRAVENOUS | Status: AC

## 2022-12-08 MED ORDER — PANTOPRAZOLE SODIUM 40 MG PO TBEC
40.0000 mg | DELAYED_RELEASE_TABLET | Freq: Every day | ORAL | Status: DC
  Administered 2022-12-08 – 2022-12-10 (×3): 40 mg via ORAL
  Filled 2022-12-08 (×3): qty 1

## 2022-12-08 MED ORDER — ALBUTEROL SULFATE (2.5 MG/3ML) 0.083% IN NEBU: 2.5000 mg | INHALATION_SOLUTION | RESPIRATORY_TRACT | Status: DC | PRN

## 2022-12-08 MED ORDER — TRAMADOL HCL 50 MG PO TABS: 50.0000 mg | ORAL_TABLET | Freq: Two times a day (BID) | ORAL | Status: DC | PRN

## 2022-12-08 MED ORDER — FLUTICASONE FUROATE-VILANTEROL 200-25 MCG/ACT IN AEPB
1.0000 | INHALATION_SPRAY | Freq: Every day | RESPIRATORY_TRACT | Status: DC
  Administered 2022-12-09 – 2022-12-10 (×2): 1 via RESPIRATORY_TRACT
  Filled 2022-12-08: qty 28

## 2022-12-08 MED ORDER — ALPRAZOLAM 0.5 MG PO TABS: 0.5000 mg | ORAL_TABLET | Freq: Every day | ORAL | Status: DC | PRN

## 2022-12-08 MED ORDER — CYCLOBENZAPRINE HCL 5 MG PO TABS: 2.5000 mg | ORAL_TABLET | Freq: Three times a day (TID) | ORAL | Status: DC | PRN

## 2022-12-08 MED ORDER — FLUOXETINE HCL 20 MG PO CAPS
40.0000 mg | ORAL_CAPSULE | Freq: Every day | ORAL | Status: DC
  Administered 2022-12-08 – 2022-12-10 (×3): 40 mg via ORAL
  Filled 2022-12-08 (×3): qty 2

## 2022-12-08 MED ORDER — ACETAMINOPHEN 500 MG PO TABS: 500.0000 mg | ORAL_TABLET | Freq: Every evening | ORAL | Status: DC | PRN

## 2022-12-08 MED ORDER — BUDESON-GLYCOPYRROL-FORMOTEROL 160-9-4.8 MCG/ACT IN AERO: 2.0000 | INHALATION_SPRAY | Freq: Two times a day (BID) | RESPIRATORY_TRACT | Status: DC

## 2022-12-08 MED ORDER — UMECLIDINIUM BROMIDE 62.5 MCG/ACT IN AEPB
1.0000 | INHALATION_SPRAY | Freq: Every day | RESPIRATORY_TRACT | Status: DC
  Administered 2022-12-09 – 2022-12-10 (×2): 1 via RESPIRATORY_TRACT
  Filled 2022-12-08: qty 7

## 2022-12-08 MED ORDER — TRAMADOL HCL 50 MG PO TABS
50.0000 mg | ORAL_TABLET | Freq: Three times a day (TID) | ORAL | Status: DC | PRN
  Administered 2022-12-08: 50 mg via ORAL
  Filled 2022-12-08: qty 1

## 2022-12-08 MED ORDER — IOHEXOL 350 MG/ML SOLN
75.0000 mL | Freq: Once | INTRAVENOUS | Status: AC | PRN
  Administered 2022-12-08: 75 mL via INTRAVENOUS

## 2022-12-08 MED ORDER — SACUBITRIL-VALSARTAN 49-51 MG PO TABS
1.0000 | ORAL_TABLET | Freq: Every day | ORAL | Status: DC
  Administered 2022-12-09 – 2022-12-10 (×2): 1 via ORAL
  Filled 2022-12-08 (×2): qty 1

## 2022-12-08 MED ORDER — ZOLPIDEM TARTRATE 5 MG PO TABS: 5.0000 mg | ORAL_TABLET | Freq: Every evening | ORAL | Status: DC | PRN

## 2022-12-08 MED ORDER — SODIUM CHLORIDE 0.9 % IV SOLN
INTRAVENOUS | Status: DC
  Administered 2022-12-09: 50 mL/h via INTRAVENOUS

## 2022-12-08 MED ORDER — METFORMIN HCL ER 500 MG PO TB24
500.0000 mg | ORAL_TABLET | Freq: Every day | ORAL | Status: DC
  Filled 2022-12-08: qty 1

## 2022-12-08 MED ORDER — SODIUM CHLORIDE 0.9 % IV SOLN: INTRAVENOUS | Status: DC

## 2022-12-08 NOTE — Assessment & Plan Note (Signed)
Last A1C 1 month ago was 7.3%. She does take metformin. She has considerable foot pain from PAD and possibly DM neuropathy. She gets good relief from tramadol.  Plan Continue home regimen  Ss coverage  Continue tramadol

## 2022-12-08 NOTE — Assessment & Plan Note (Signed)
Patient has had several toe amputations both feet. She is followed by Dr. Carmelia Roller, vascular surgeon. She was to have LE angiogram for staging. Study cancelled due to pro-op lab revealing Hgb 5.8  Plan Treat anemia with a goal of Hgb > 8g  Will notify radiology - may be able to schedule angiogram for Monday, March 11th

## 2022-12-08 NOTE — Assessment & Plan Note (Addendum)
Patient with h/o multiple AVMs gastric and intestinal which has been a source of bleeding in the past - as recently as 10/26/22. She has also suffered from severe iron deficiency requiring Iron infusions in the past - last about a year ago. She is followed by hematology at [facility]. She has not had any signs of GI bleeding and per EDP exam stool was negative for blood.  Plan Transfuse to Hgb of at least 8 (3 units and recheck)  Check Iron level - if deficient Iron infusion  No indication of GI bleeding - will consult GI for completeness

## 2022-12-08 NOTE — Assessment & Plan Note (Signed)
Patient stable despite severe anemia. Last ECHO 06/09/22 with EF 60-65%, grade I DD.  Plan Continue home regimen

## 2022-12-08 NOTE — Assessment & Plan Note (Signed)
BP mildly elevated at admission. Has regular medical care and reports she is generally well controlled

## 2022-12-08 NOTE — ED Provider Notes (Signed)
[facility] Provider Note   CSN: [id number] Arrival date & time: 12/08/22  1053     History  Chief Complaint  Patient presents with   Abnormal Lab    Low Hbg in preop for angiogram    Tammy Mcpherson is a 67 y.o. female with COPD, HTN, PAD with prior stents in 2 amputations, T2DM, IDA, PSVT, arthritis, anxiety who presents with abnormal lab values.   Per chart review, patient was seen by Dr. Stanford Breed from vascular surgery today in clinic with bilateral chronic limb threatening ischemia of feet.  Hemoglobin this morning on preangiogram labs was 5.8 dropped from 9.3 on 11/06/2022.  Patient had recent admission for GI bleed in January requiring PRBC transfusions likely due to erosive gastritis and diverticulosis.  Patient has dry gangrene of right second toe and ulceration of right great toe.  Has history of right popliteal angioplasty and stenting and right AT angioplasty in 2022.  Patient takes aspirin and Plavix but otherwise no blood thinners.  Patient was sent to ED from vascular clinic for further workup.  Patient presents with her husband.  She complains of bilateral foot pain left worse than right as well as fatigue chronically and may be some dyspnea on exertion.  She denies any chest pain, lightheadedness, syncope, falls, shortness of breath at rest, abdominal pain, nausea or diarrhea constipation, nausea/medic easier.  Husband reports that she has a history of IDA and has had iron infusions as well as blood transfusions for this in the past.  She denies any rectal bleeding since her admission for rectal bleeding in January.    HPI     Home Medications Prior to Admission medications   Medication Sig Start Date End Date Taking? Authorizing Provider  aspirin 81 MG chewable tablet Chew 1 tablet (81 mg total) by mouth every morning. 10/30/22  Yes Manuella Ghazi, Pratik D, DO  clopidogrel (PLAVIX) 75 MG tablet Take 1 tablet (75 mg total) by mouth daily. TAKE  ONE TABLET ('75MG'$  TOTAL) BY MOUTH DAILY WITH BREAKFAST 10/30/22  Yes Manuella Ghazi, Pratik D, DO  acetaminophen (TYLENOL) 500 MG tablet Take 500 mg by mouth at bedtime as needed for moderate pain.    [provider]  albuterol (PROVENTIL) (2.5 MG/3ML) 0.083% nebulizer solution Take 3 mLs (2.5 mg total) by nebulization every 4 (four) hours as needed for wheezing or shortness of breath. 04/26/22   [name], NP  albuterol (VENTOLIN HFA) 108 (90 Base) MCG/ACT inhaler INHALE TWO PUFFS INTO THE LUNGS EVERY SIX HOURS AS NEEDED FOR WHEEZING OR SHORTNESS OF BREATH 04/26/22   [name], NP  ALPRAZolam Duanne Moron) 0.5 MG tablet TAKE ONE TABLET (0.'5MG'$  TOTAL) BY MOUTH DAILY AS NEEDED FOR ANXIETY 04/25/22   [name], NP  atorvastatin (LIPITOR) 80 MG tablet Take 1 tablet (80 mg total) by mouth daily. 06/07/22   Satira Sark, MD  blood glucose meter kit and supplies Dispense based on patient and insurance preference. Use up to four times daily as directed. (FOR ICD-10 E10.9, E11.9). 11/22/20   [name], NP  Budeson-Glycopyrrol-Formoterol (BREZTRI AEROSPHERE) 160-9-4.8 MCG/ACT AERO Inhale 2 puffs into the lungs 2 (two) times daily. 11/15/20   Garner Nash, DO  Cholecalciferol (VITAMIN D) 50 MCG (2000 UT) CAPS Take 1 capsule (2,000 Units total) by mouth daily. 03/15/22   Derek Jack, MD  Continuous Blood Gluc Receiver (FREESTYLE LIBRE 2 READER) DEVI Check blood glucose up to 4 times daily 10/11/22   Inda Coke,  PA  Continuous Blood Gluc Sensor (FREESTYLE LIBRE 2 SENSOR) MISC Apply one sensor to the upper arm avery 14 days. 04/26/22   [name], NP  cyanocobalamin (VITAMIN B12) 1000 MCG/ML injection INJECT 1 ML INTO THE MUSCLE EVERY 30 DAYS. Patient taking differently: Inject 1,000 mcg into the skin every 30 (thirty) days. 08/09/22   Derek Jack, MD  cyclobenzaprine (FLEXERIL) 5 MG tablet Take 1 tablet (5 mg total) by mouth 3 (three) times daily as needed for muscle  spasms. Patient taking differently: Take 2.5-5 mg by mouth 3 (three) times daily as needed for muscle spasms. 04/25/22   [name], NP  FLUoxetine (PROZAC) 40 MG capsule Take 1 capsule (40 mg total) by mouth daily. 04/26/22   [name], NP  gabapentin (NEURONTIN) 300 MG capsule TAKE ONE CAPSULE (300 MG TOTAL) BY MOUTHTWO TIMES DAILY. Patient taking differently: Take 300 mg by mouth 3 (three) times daily. TAKE ONE CAPSULE (300 MG TOTAL) BY MOUTHTWO TIMES DAILY. 04/26/22   [name], NP  glucose blood [facility] ULTRA) test strip Use as instructed 02/26/19   Letta Median K, DO  glucose monitoring kit (FREESTYLE) monitoring kit Use to check blood sugar BID as directed 01/24/19   Cirigliano, Stanton Kidney K, DO  Lancets (ONETOUCH DELICA PLUS [id number]) MISC 1 each by Does not apply route 2 (two) times a day. 02/26/19   Cirigliano, Garvin Fila, DO  metFORMIN (GLUCOPHAGE-XR) 500 MG 24 hr tablet Take 1 tablet (500 mg total) by mouth daily with breakfast. 10/25/22   Inda Coke, PA  Misc. Devices MISC Please provide supplies (needle, syringe, alcohol swabs) needed for patient to self-administer B-12 injections monthly. 01/07/20   Derek Jack, MD  pantoprazole (PROTONIX) 40 MG tablet TAKE ONE (1) TABLET 30 MINS BEFORE YOUR FIRST MEAL. 04/26/22   [name], NP  sacubitril-valsartan (ENTRESTO) 49-51 MG Take 1 tablet by mouth 2 (two) times daily. Patient taking differently: Take 1 tablet by mouth daily. 06/07/22   Satira Sark, MD  traMADol (ULTRAM) 50 MG tablet Take 1 tablet (50 mg total) by mouth every 12 (twelve) hours as needed. 11/29/22   Inda Coke, PA  Tuberculin-Allergy Syringes Flossie Buffy TUBERCULIN SAFETY SYR) 25G X 1" 1 ML MISC USE TO ADMINISTER INJECTABLE VITAMIN B12. 08/09/22   Derek Jack, MD      Allergies    Bee venom, Codeine, Penicillins, Bupropion, and Sudafed [pseudoephedrine hcl]    Review of Systems   Review of Systems Review of systems Negative for  f/c.  A 10 point review of systems was performed and is negative unless otherwise reported in HPI.  Physical Exam Updated Vital Signs BP (!) 156/44 (BP Location: Left Arm)   Pulse 88   Temp 97.7 F (36.5 C) (Oral)   Resp 12   Ht '5\' 3"'$  (1.6 m)   Wt 60.3 kg   SpO2 100%   BMI 23.56 kg/m  Physical Exam General: Very pale appearing female, lying in bed.  HEENT: PERRLA, Sclera anicteric, MMM, trachea midline.  Cardiology: RRR, no murmurs/rubs/gallops.  Resp: Normal respiratory rate and effort. CTAB, no wheezes, rhonchi, crackles.  Abd: Soft, non-tender, non-distended. No rebound tenderness or guarding.  GU: Deferred. MSK: No peripheral edema or signs of trauma. Please see Dr. Mora Appl note from earlier today for lower extremity photos, but dark ulcers noted on left great toe and MTP joint. TTP to patient. Feet are cool. S/p 1st/2nd toe amputations of R foot. Poor capillary refill, pulses weakly palpable on BL feet.  Skin: warm,  dry  Neuro: A&Ox4, CNs II-XII grossly intact. MAEs. Sensation grossly intact.  Psych: Normal mood and affect.   ED Results / Procedures / Treatments   Labs (all labs ordered are listed, but only abnormal results are displayed) Labs Reviewed  CBC - Abnormal; Notable for the following components:      Result Value   WBC 15.2 (*)    RBC 2.21 (*)    Hemoglobin 4.4 (*)    HCT 16.5 (*)    MCV 74.7 (*)    MCH 19.9 (*)    MCHC 26.7 (*)    RDW 19.9 (*)    Platelets 824 (*)    nRBC 0.4 (*)    All other components within normal limits  BASIC METABOLIC PANEL - Abnormal; Notable for the following components:   Potassium 3.3 (*)    Chloride 96 (*)    Glucose, Bld 191 (*)    BUN 7 (*)    Calcium 8.6 (*)    Anion gap 16 (*)    All other components within normal limits  LACTIC ACID, PLASMA - Abnormal; Notable for the following components:   Lactic Acid, Venous 3.8 (*)    All other components within normal limits  POCT I-STAT, CHEM 8 - Abnormal; Notable for the  following components:   Sodium 133 (*)    Potassium 3.3 (*)    Chloride 95 (*)    BUN 7 (*)    Glucose, Bld 190 (*)    Calcium, Ion 1.03 (*)    Hemoglobin 5.8 (*)    HCT 17.0 (*)    All other components within normal limits  PROTIME-INR  APTT  LACTIC ACID, PLASMA  POC OCCULT BLOOD, ED  TYPE AND SCREEN  PREPARE RBC (CROSSMATCH)    EKG EKG Interpretation  Date/Time:  Friday December 08 2022 11:09:08 EST Ventricular Rate:  95 PR Interval:  148 QRS Duration: 104 QT Interval:  385 QTC Calculation: 484 R Axis:   70 Text Interpretation: Sinus rhythm Borderline ST depression, diffuse leads Confirmed by Cindee Lame [phone]) on 12/08/2022 12:08:26 PM  Radiology CT ABDOMEN PELVIS W CONTRAST  Result Date: 12/08/2022 CLINICAL DATA:  Acute generalized abdominal pain. EXAM: CT ABDOMEN AND PELVIS WITH CONTRAST TECHNIQUE: Multidetector CT imaging of the abdomen and pelvis was performed using the standard protocol following bolus administration of intravenous contrast. RADIATION DOSE REDUCTION: This exam was performed according to the departmental dose-optimization program which includes automated exposure control, adjustment of the mA and/or kV according to patient size and/or use of iterative reconstruction technique. CONTRAST:  75m OMNIPAQUE IOHEXOL 350 MG/ML SOLN COMPARISON:  June 17, 2020. FINDINGS: Lower chest: No acute abnormality. Hepatobiliary: No focal liver abnormality is seen. Status post cholecystectomy. No biliary dilatation. Pancreas: Unremarkable. No pancreatic ductal dilatation or surrounding inflammatory changes. Spleen: Normal in size without focal abnormality. Adrenals/Urinary Tract: Adrenal glands are unremarkable. Kidneys are normal, without renal calculi, focal lesion, or hydronephrosis. Bladder is unremarkable. Stomach/Bowel: Stomach is within normal limits. Appendix appears normal. No evidence of bowel wall thickening, distention, or inflammatory changes. Vascular/Lymphatic:  Aortic atherosclerosis. No enlarged abdominal or pelvic lymph nodes. Reproductive: Status post hysterectomy. No adnexal masses. Other: No abdominal wall hernia or abnormality. No abdominopelvic ascites. Musculoskeletal: No acute or significant osseous findings. IMPRESSION: No acute abnormality seen in the abdomen or pelvis. Aortic Atherosclerosis (ICD10-I70.0). Electronically Signed   By: JMarijo ConceptionM.D.   On: 12/08/2022 13:33    Procedures Procedures    Medications Ordered in ED  Medications  0.9 %  sodium chloride infusion (Manually program via Guardrails IV Fluids) (has no administration in time range)  iohexol (OMNIPAQUE) 350 MG/ML injection 75 mL (75 mLs Intravenous Contrast Given 12/08/22 1324)    ED Course/ Medical Decision Making/ A&P                          Medical Decision Making Amount and/or Complexity of Data Reviewed Labs: ordered. Decision-making details documented in ED Course. Radiology: ordered. Decision-making details documented in ED Course.  Risk Prescription drug management. Decision regarding hospitalization.    This patient presents to the ED for concern of anemia, this involves an extensive number of treatment options, and is a complaint that carries with it a high risk of complications and morbidity.  I considered the following differential and admission for this acute, potentially life threatening condition. However patient is currently HDS, no signs of acute hemorrhagic shock.  MDM:    Patient with concern for GIB with acute blood loss anemia though rectal exam demonstrates no gross blood. Consider also IDA given history. No known vaginal bleeding, hematuria, or rectal bleeding. She denied abdominal pain but endorsed TTP diffusely throughout abdomen so must consider intraabdominal pathology, such as vascular pathology, appendicitis, diverticulitis/diverticulosis. She doesn't have any lightheadedness, tachycardia, or syncope but does feel fatigued  indicating symptomatic anemia. Will retest Hgb here and order blood transfusion. Do not believe patient requires emergency release at this time, and POC fecal occult is negative for blood, reassuring against GI hemorrhage. Labs show MCV low, RDW high, indicating likely microcytic anemia.  Will d/w [facility] from vascular about limb ischemia as he evaluated her this AM. She states her feet are painful but the same as earlier.  Clinical Course as of 12/08/22 1404  Fri Dec 08, 2022  1119 Hemoglobin(!!): 5.8 [HN]  1138 Hemoglobin(!!): 4.4 Ordered for 2 U blood [HN]  1139 5.8 to 4.4 too large a drop to be real drop in a matter of minutes. She would be hemorrhaging and she is actually HDS at this time. Patient has had no bloody bowel movements since arriving to ED. Will transfuse 2 U blood and recheck. [HN]  1143 WBC(!): 15.2 +Leukocytosis [HN]  1143 Platelets(!): 824 [HN]  1206 Fecal Occult Blood, POC: NEGATIVE [HN]  [id number] DRE shows no gross blood, fecal occult negative. [HN]  1332 Lactic Acid, Venous(!!): 3.8 2U PRBCs incoming [HN]  1346 CT ABDOMEN PELVIS W CONTRAST No acute abnormality seen in the abdomen or pelvis. [HN]  61 D/w Dr. Stanford Breed who will await improvement of anemia and investigation thereof before moving forward with vascular procedures of LEs. [HN]  [id number] Consulted to hospitalist for admission. [HN]    Clinical Course User Index [HN] Audley Hose, MD    Labs: I Ordered, and personally interpreted labs.  The pertinent results include:  those listed above  Imaging Studies ordered: I ordered imaging studies including CT abd I independently visualized and interpreted imaging. I agree with the radiologist interpretation  Additional history obtained from chart review, husband at bedside.    Cardiac Monitoring: The patient was maintained on a cardiac monitor.  I personally viewed and interpreted the cardiac monitored which showed an underlying rhythm of:  NSR  Reevaluation: After the interventions noted above, I reevaluated the patient and found that they have :improved  Social Determinants of Health: Patient lives independently   Disposition:  Admit to hospitalist, blood transfusion pending  Co morbidities that complicate  the patient evaluation  Past Medical History:  Diagnosis Date   Anemia 04/30/2016   Anxiety    Arthritis    Cardiomyopathy ([place])    a. EF 40-45% by echo in 04/2016 b. Improved to 60-65% by repeat imaging in 2018   COPD (chronic obstructive pulmonary disease) (HCC)    Coronary artery calcification seen on CT scan    Essential hypertension    GERD (gastroesophageal reflux disease)    Headache    History of bronchitis    History of GI bleed    History of hiatal hernia    Hyperlipidemia    Iron deficiency anemia    Peripheral vascular disease (HCC)    Pollen allergy    PSVT (paroxysmal supraventricular tachycardia)    PVC's (premature ventricular contractions)    Type 2 diabetes mellitus (HCC)    Vitamin B12 deficiency      Medicines Meds ordered this encounter  Medications   DISCONTD: 0.9 %  sodium chloride infusion   0.9 %  sodium chloride infusion (Manually program via Guardrails IV Fluids)   iohexol (OMNIPAQUE) 350 MG/ML injection 75 mL    I have reviewed the patients home medicines and have made adjustments as needed  Problem List / ED Course: Problem List Items Addressed This Visit   None Visit Diagnoses     Symptomatic anemia    -  Primary   Critical limb ischemia of both lower extremities ([place])       Peripheral artery disease ([place])                       This note was created using dictation software, which may contain spelling or grammatical errors.    Audley Hose, MD 12/08/22 [phone]

## 2022-12-08 NOTE — Assessment & Plan Note (Signed)
Patient continues to smoke. Denies wheezing or respirtory distress  Plan Continue home regimen  Advised to stop smoking

## 2022-12-08 NOTE — ED Notes (Signed)
Pt verbalized understanding of blood transfusion risks and benefits. Pt give verbal and electronic consent prior to blood administration.

## 2022-12-08 NOTE — Consult Note (Addendum)
Consultation Note   Referring Provider:  [facility] PCP: Inda Coke, [place] Primary Gastroenterologist:    [facility]    Reason for consultation: anemia   Hospital Day: 1  ASSESSMENT  # Recurrent anemia, FOBT negative. History of gastrointestinal AVMs 67 yo female on plavix with profound iron deficiency anemia and a history of  multiple gastric / intestinal AVMs. She is s/p APC of multiple ( 20) small bowel AVMs in late January. IDA followed by Hematology and receives periodic iron infusions.   # PAD.  Having lower leg pain. Was for outpatient angiogram today but put on hold due to recurrent severe anemia.   # GERD.  Symptoms controlled on daily PPI  # COPD /  ongoing tobacco use  # IBS-D   PLAN:   -Can have regular diet. NPO after MN -Scheduled for enteroscopy to be done tomorrow. The risks and benefits of enteroscopy with possible biopsies were discussed with the patient who agrees to proceed.  -Blood transfusion in progress. Getting 1 of 3 units right now -Continue daily PPI   HPI:  Patient is a 67 y.o. year old female with a past medical history of  GERD, IBS-D, lactose intolerance, PUD, IDA followed by Hematology, gastric / duodenal AVMs s/p ablation, DM, HTN, PAD, cardiomyopathy, COPD  See PMH for any additional medical problems.  Ahnika was hospitalized 10/26/22  with recurrent anemia / melena on ASA and plavix. Hgb was 5.3, down from 12 in October. She was transfused 2 u PRBCs with improvement in hgb into low 8 range  Izamar had pre-op labs today in preparation for angiogram ordered by Vascular Surgery. Found to have recurrent anemia.  Today hgb 5.8. she hasn't had any overt GI bleeding. She has not abdominal pain, N/V or other GI symptoms.    Previous GI History / Evaluation :   Most recent EGD 10/27/22 -- Normal esophagus. Small hiatal hernia. Abnormal appearing gastric mucosa of uncertain  significance - status post gastric biopsy -20 duodenal AVMs (2 actively oozing) status post APC's sealing FINAL MICROSCOPIC DIAGNOSIS:  A. STOMACH, BIOPSY:  Gastric mucosa with focal erosion, hyperemia and reactive changes.  No Helicobacter pylori identified.    Most recent colonoscopy Oct 2017 -- Non-thrombosed external hemorrhoids found on digital rectal exam. - Redundant LEFT colon. - Diverticulosis at the hepatic flexure. - The examined portion of the ileum was normal. - Non-bleeding internal hemorrhoids. - NO SOURCE FOR FEDA IDENTIFIED    Recent Imaging and Labs: CT ABDOMEN PELVIS W CONTRAST  Result Date: 12/08/2022 CLINICAL DATA:  Acute generalized abdominal pain. EXAM: CT ABDOMEN AND PELVIS WITH CONTRAST TECHNIQUE: Multidetector CT imaging of the abdomen and pelvis was performed using the standard protocol following bolus administration of intravenous contrast. RADIATION DOSE REDUCTION: This exam was performed according to the departmental dose-optimization program which includes automated exposure control, adjustment of the mA and/or kV according to patient size and/or use of iterative reconstruction technique. CONTRAST:  79m OMNIPAQUE IOHEXOL 350 MG/ML SOLN COMPARISON:  June 17, 2020. FINDINGS: Lower chest: No acute abnormality. Hepatobiliary: No focal liver abnormality is seen. Status post cholecystectomy. No biliary dilatation. Pancreas: Unremarkable. No pancreatic ductal dilatation or surrounding inflammatory changes. Spleen: Normal in size without focal abnormality. Adrenals/Urinary Tract:  Adrenal glands are unremarkable. Kidneys are normal, without renal calculi, focal lesion, or hydronephrosis. Bladder is unremarkable. Stomach/Bowel: Stomach is within normal limits. Appendix appears normal. No evidence of bowel wall thickening, distention, or inflammatory changes. Vascular/Lymphatic: Aortic atherosclerosis. No enlarged abdominal or pelvic lymph nodes. Reproductive: Status post  hysterectomy. No adnexal masses. Other: No abdominal wall hernia or abnormality. No abdominopelvic ascites. Musculoskeletal: No acute or significant osseous findings. IMPRESSION: No acute abnormality seen in the abdomen or pelvis. Aortic Atherosclerosis (ICD10-I70.0). Electronically Signed   By: Marijo Conception M.D.   On: 12/08/2022 13:33   VAS [place] ABI WITH/WO TBI  Result Date: 12/05/2022  LOWER EXTREMITY DOPPLER STUDY Patient Name:  LEROY GIDEON  Date of Exam:   12/05/2022 Medical Rec #: [id number]      Accession #:    [id number] Date of Birth: [birth year]     Patient Gender: F Patient Age:   66 years Exam Location:  [facility] Procedure:      VAS [place] ABI WITH/WO TBI Referring Phys: --------------------------------------------------------------------------------  Indications: Ulceration, and peripheral artery disease. Ulceration of the toes              of the left foot. Patient reports ulcers have been present              approximately 10 days. High Risk Factors: Current smoker.  Vascular Interventions: 06/01/2021                         Right great toe and second toe amputation.                         05/19/2021 Right popliteal angioplasty and stenting. Performing Technologist: Ronal Fear RVS, RCS  Examination Guidelines: A complete evaluation includes at minimum, Doppler waveform signals and systolic blood pressure reading at the level of bilateral brachial, anterior tibial, and posterior tibial arteries, when vessel segments are accessible. Bilateral testing is considered an integral part of a complete examination. Photoelectric Plethysmograph (PPG) waveforms and toe systolic pressure readings are included as required and additional duplex testing as needed. Limited examinations for reoccurring indications may be performed as noted.  ABI Findings: +---------+------------------+-----+-------------------+----------+ Right    Rt Pressure (mmHg)IndexWaveform           Comment     +---------+------------------+-----+-------------------+----------+ PTA      33                0.22 dampened monophasic           +---------+------------------+-----+-------------------+----------+ DP       30                0.20 dampened monophasic           +---------+------------------+-----+-------------------+----------+ Great Toe                                          amputation +---------+------------------+-----+-------------------+----------+ +---------+------------------+-----+--------+-------+ Left     Lt Pressure (mmHg)IndexWaveformComment +---------+------------------+-----+--------+-------+ Brachial 151                                    +---------+------------------+-----+--------+-------+ PTA  absent          +---------+------------------+-----+--------+-------+ DP                              absent          +---------+------------------+-----+--------+-------+ Great Toe                       Absent          +---------+------------------+-----+--------+-------+ +-------+-----------+-----------+------------+------------+ ABI/TBIToday's ABIToday's TBIPrevious ABIPrevious TBI +-------+-----------+-----------+------------+------------+ Right  0.22       amputation 0.90        amputation   +-------+-----------+-----------+------------+------------+ Left   absent     absent     0.97                     +-------+-----------+-----------+------------+------------+  Bilateral ABIs appear decreased compared to prior study on 09/06/2021.  Summary: Right: Resting right ankle-brachial index indicates critical limb ischemia. Left: No Doppler flow detected in the posterior tibial or dorsalis pedis artery.  *See table(s) above for measurements and observations.  Electronically signed by Harold Barban MD on 12/05/2022 at 10:16:39 PM.    Final       Labs:  Recent Labs    12/08/22 1020 12/08/22 1029  WBC  --  15.2*   HGB 5.8* 4.4*  HCT 17.0* 16.5*  PLT  --  824*   Recent Labs    12/08/22 1020 12/08/22 1035  NA 133* 135  K 3.3* 3.3*  CL 95* 96*  CO2  --  23  GLUCOSE 190* 191*  BUN 7* 7*  CREATININE 0.60 0.84  CALCIUM  --  8.6*   No results for input(s): "PROT", "ALBUMIN", "AST", "ALT", "ALKPHOS", "BILITOT", "BILIDIR", "IBILI" in the last 72 hours. No results for input(s): "HEPBSAG", "HCVAB", "HEPAIGM", "HEPBIGM" in the last 72 hours. Recent Labs    12/08/22 1125  LABPROT 15.2  INR 1.2    Past Medical History:  Diagnosis Date   Anemia 04/30/2016   Anxiety    Arthritis    Cardiomyopathy ([place])    a. EF 40-45% by echo in 04/2016 b. Improved to 60-65% by repeat imaging in 2018   COPD (chronic obstructive pulmonary disease) (HCC)    Coronary artery calcification seen on CT scan    Essential hypertension    GERD (gastroesophageal reflux disease)    Headache    History of bronchitis    History of GI bleed    History of hiatal hernia    Hyperlipidemia    Iron deficiency anemia    Peripheral vascular disease (HCC)    Pollen allergy    PSVT (paroxysmal supraventricular tachycardia)    PVC's (premature ventricular contractions)    Type 2 diabetes mellitus ([place])    Vitamin B12 deficiency     Past Surgical History:  Procedure Laterality Date   ABDOMINAL AORTOGRAM W/LOWER EXTREMITY Bilateral 05/19/2021   Procedure: ABDOMINAL AORTOGRAM W/LOWER EXTREMITY;  Surgeon: [name], MD;  Location: [facility];  Service: Cardiovascular;  Laterality: Bilateral;   ABDOMINAL HYSTERECTOMY     polyps  about age 80   AIKEN OSTEOTOMY Right 07/10/2013   Procedure: Barbie Banner OSTEOTOMY RIGHT FOOT;  Surgeon: Marcheta Grammes, DPM;  Location: [facility];  Service: Orthopedics;  Laterality: Right;   AMPUTATION Left 05/09/2017   Procedure: AMPUTATION 5TH TOE LEFT FOOT;  Surgeon: [name], DPM;  Location: [facility];  Service: Podiatry;  Laterality: Left;   AMPUTATION Left 06/13/2017    Procedure: AMPUTATION 2ND TOE LEFT FOOT;  Surgeon: [name], DPM;  Location: [facility];  Service: Podiatry;  Laterality: Left;   AMPUTATION Right 06/01/2021   Procedure: Right great toe and Second Toe Amputation;  Surgeon: [name], MD;  Location: [facility];  Service: Vascular;  Laterality: Right;   BIOPSY  10/27/2022   Procedure: BIOPSY;  Surgeon: Daneil Dolin, MD;  [facility];  Service: Endoscopy;;   BUNIONECTOMY Right 07/10/2013   Procedure: Maryfrances Bunnell RIGHT FOOT;  Surgeon: Marcheta Grammes, DPM;  Location: [facility];  Service: Orthopedics;  Laterality: Right;   CHOLECYSTECTOMY N/A 08/22/2017   Procedure: LAPAROSCOPIC CHOLECYSTECTOMY;  Surgeon: Virl Cagey, MD;  Location: [facility];  Service: General;  Laterality: N/A;   COLONOSCOPY N/A 07/07/2016   Dr. Oneida Alar; redundant left colon, diverticulosis at hepatic flexure, non-bleeding internal hemorrhoids   ENTEROSCOPY N/A 10/27/2022   Procedure: ENTEROSCOPY;  Surgeon: Daneil Dolin, MD;  [facility];  Service: Endoscopy;  Laterality: N/A;   ESOPHAGOGASTRODUODENOSCOPY N/A 07/07/2016   Dr. Oneida Alar: many non-bleeding cratered gastric ulcers without stigmata of bleeding in gastric antrum. four 2-3 mm angioectasias without bleeding in duodenal bulb and second portion of duodenum s/p APC. Chroni gastritis on path.    ESOPHAGOGASTRODUODENOSCOPY N/A 08/03/2017   Dr. Oneida Alar: erosive gastritis, AVMs. Found a single non-bleeding angioectasia in stomach, s/p APC therapy. Four non-bleeding angioectasias in duodenum s/p APC. Non-bleeding erosive gastropathy   HOT HEMOSTASIS  10/27/2022   Procedure: HOT HEMOSTASIS (ARGON PLASMA COAGULATION/BICAP);  Surgeon: Daneil Dolin, MD;  [facility];  Service: Endoscopy;;   IR ANGIOGRAM EXTREMITY LEFT  04/24/2017   IR FEM POP ART ATHERECT INC PTA MOD SED  04/24/2017   IR INFUSION THROMBOL ARTERIAL INITIAL (MS)  04/24/2017   IR RADIOLOGIST EVAL & MGMT  12/05/2016    IR US GUIDE VASC ACCESS RIGHT  04/24/2017   LIVER BIOPSY N/A 08/22/2017   Procedure: LIVER BIOPSY;  Surgeon: Virl Cagey, MD;  Location: [facility];  Service: General;  Laterality: N/A;   METATARSAL HEAD EXCISION Right 07/10/2013   Procedure: METATARSAL HEAD RESECTION OF DIGITS 2 AND 3 RIGHT FOOT;  Surgeon: Marcheta Grammes, DPM;  Location: [facility];  Service: Orthopedics;  Laterality: Right;   PERIPHERAL VASCULAR INTERVENTION Right 05/19/2021   Procedure: PERIPHERAL VASCULAR INTERVENTION;  Surgeon: [name], MD;  Location: [facility];  Service: Cardiovascular;  Laterality: Right;   PROXIMAL INTERPHALANGEAL FUSION (PIP) Right 07/10/2013   Procedure: ARTHRODESIS PIPJ  2ND DIGIT RIGHT FOOT;  Surgeon: Marcheta Grammes, DPM;  Location: [facility];  Service: Orthopedics;  Laterality: Right;    Family History  Adopted: Yes  Problem Relation Age of Onset   Hypertension Father    Coronary artery disease Sister    Ulcers Maternal Grandmother    Colon cancer Neg Hx        unknown, was adopted    Prior to Admission medications   Medication Sig Start Date End Date Taking? Authorizing Provider  aspirin 81 MG chewable tablet Chew 1 tablet (81 mg total) by mouth every morning. 10/30/22  Yes Manuella Ghazi, Pratik D, DO  clopidogrel (PLAVIX) 75 MG tablet Take 1 tablet (75 mg total) by mouth daily. TAKE ONE TABLET ('75MG'$  TOTAL) BY MOUTH DAILY WITH BREAKFAST 10/30/22  Yes Manuella Ghazi, Pratik D, DO  acetaminophen (TYLENOL) 500 MG tablet Take 500 mg by mouth at bedtime as  needed for moderate pain.    [provider]  albuterol (PROVENTIL) (2.5 MG/3ML) 0.083% nebulizer solution Take 3 mLs (2.5 mg total) by nebulization every 4 (four) hours as needed for wheezing or shortness of breath. 04/26/22   [name], NP  albuterol (VENTOLIN HFA) 108 (90 Base) MCG/ACT inhaler INHALE TWO PUFFS INTO THE LUNGS EVERY SIX HOURS AS NEEDED FOR WHEEZING OR SHORTNESS OF BREATH 04/26/22   [name], NP   ALPRAZolam Duanne Moron) 0.5 MG tablet TAKE ONE TABLET (0.'5MG'$  TOTAL) BY MOUTH DAILY AS NEEDED FOR ANXIETY 04/25/22   [name], NP  atorvastatin (LIPITOR) 80 MG tablet Take 1 tablet (80 mg total) by mouth daily. 06/07/22   Satira Sark, MD  blood glucose meter kit and supplies Dispense based on patient and insurance preference. Use up to four times daily as directed. (FOR ICD-10 E10.9, E11.9). 11/22/20   [name], NP  Budeson-Glycopyrrol-Formoterol (BREZTRI AEROSPHERE) 160-9-4.8 MCG/ACT AERO Inhale 2 puffs into the lungs 2 (two) times daily. 11/15/20   Garner Nash, DO  Cholecalciferol (VITAMIN D) 50 MCG (2000 UT) CAPS Take 1 capsule (2,000 Units total) by mouth daily. 03/15/22   Derek Jack, MD  Continuous Blood Gluc Receiver (FREESTYLE LIBRE 2 READER) DEVI Check blood glucose up to 4 times daily 10/11/22   Inda Coke, PA  Continuous Blood Gluc Sensor (FREESTYLE LIBRE 2 SENSOR) MISC Apply one sensor to the upper arm avery 14 days. 04/26/22   [name], NP  cyanocobalamin (VITAMIN B12) 1000 MCG/ML injection INJECT 1 ML INTO THE MUSCLE EVERY 30 DAYS. Patient taking differently: Inject 1,000 mcg into the skin every 30 (thirty) days. 08/09/22   Derek Jack, MD  cyclobenzaprine (FLEXERIL) 5 MG tablet Take 1 tablet (5 mg total) by mouth 3 (three) times daily as needed for muscle spasms. Patient taking differently: Take 2.5-5 mg by mouth 3 (three) times daily as needed for muscle spasms. 04/25/22   [name], NP  FLUoxetine (PROZAC) 40 MG capsule Take 1 capsule (40 mg total) by mouth daily. 04/26/22   [name], NP  gabapentin (NEURONTIN) 300 MG capsule TAKE ONE CAPSULE (300 MG TOTAL) BY MOUTHTWO TIMES DAILY. Patient taking differently: Take 300 mg by mouth 3 (three) times daily. TAKE ONE CAPSULE (300 MG TOTAL) BY MOUTHTWO TIMES DAILY. 04/26/22   [name], NP  glucose blood [facility] ULTRA) test strip Use as instructed 02/26/19   Letta Median K, DO   glucose monitoring kit (FREESTYLE) monitoring kit Use to check blood sugar BID as directed 01/24/19   Cirigliano, Stanton Kidney K, DO  Lancets (ONETOUCH DELICA PLUS 123XX123) MISC 1 each by Does not apply route 2 (two) times a day. 02/26/19   Cirigliano, Garvin Fila, DO  metFORMIN (GLUCOPHAGE-XR) 500 MG 24 hr tablet Take 1 tablet (500 mg total) by mouth daily with breakfast. 10/25/22   Inda Coke, PA  Misc. Devices MISC Please provide supplies (needle, syringe, alcohol swabs) needed for patient to self-administer B-12 injections monthly. 01/07/20   Derek Jack, MD  pantoprazole (PROTONIX) 40 MG tablet TAKE ONE (1) TABLET 30 MINS BEFORE YOUR FIRST MEAL. 04/26/22   [name], NP  sacubitril-valsartan (ENTRESTO) 49-51 MG Take 1 tablet by mouth 2 (two) times daily. Patient taking differently: Take 1 tablet by mouth daily. 06/07/22   Satira Sark, MD  traMADol (ULTRAM) 50 MG tablet Take 1 tablet (50 mg total) by mouth every 12 (twelve) hours as needed. 11/29/22   Inda Coke, PA  Tuberculin-Allergy Syringes Flossie Buffy TUBERCULIN SAFETY SYR)  25G X 1" 1 ML MISC USE TO ADMINISTER INJECTABLE VITAMIN B12. 08/09/22   Derek Jack, MD    Current Facility-Administered Medications  Medication Dose Route Frequency Provider Last Rate Last Admin   0.9 %  sodium chloride infusion (Manually program via Guardrails IV Fluids)   Intravenous Once Audley Hose, MD       0.9 %  sodium chloride infusion   Intravenous Continuous Norins, Heinz Knuckles, MD       acetaminophen (TYLENOL) tablet 500 mg  500 mg Oral QHS PRN Norins, Heinz Knuckles, MD       albuterol (PROVENTIL) (2.5 MG/3ML) 0.083% nebulizer solution 2.5 mg  2.5 mg Nebulization Q4H PRN Norins, Heinz Knuckles, MD       ALPRAZolam Duanne Moron) tablet 0.5 mg  0.5 mg Oral Daily PRN Norins, Heinz Knuckles, MD       atorvastatin (LIPITOR) tablet 80 mg  80 mg Oral Daily Norins, Heinz Knuckles, MD       Budeson-Glycopyrrol-Formoterol 160-9-4.8 MCG/ACT AERO 2 puff  2 puff  Inhalation BID Norins, Heinz Knuckles, MD       cyclobenzaprine (FLEXERIL) tablet 2.5-5 mg  2.5-5 mg Oral TID PRN Norins, Heinz Knuckles, MD       FLUoxetine (PROZAC) capsule 40 mg  40 mg Oral Daily Norins, Heinz Knuckles, MD       gabapentin (NEURONTIN) capsule 300 mg  300 mg Oral TID Norins, Heinz Knuckles, MD       HYDROmorphone (DILAUDID) injection 0.5-1 mg  0.5-1 mg Intravenous Q2H PRN Norins, Heinz Knuckles, MD       [START ON 12/09/2022] metFORMIN (GLUCOPHAGE-XR) 24 hr tablet 500 mg  500 mg Oral Q breakfast Norins, Heinz Knuckles, MD       pantoprazole (PROTONIX) EC tablet 40 mg  40 mg Oral Daily Norins, Heinz Knuckles, MD       sacubitril-valsartan (ENTRESTO) 49-51 mg per tablet  1 tablet Oral Daily Norins, Heinz Knuckles, MD       traMADol Veatrice Bourbon) tablet 50 mg  50 mg Oral Q8H PRN Norins, Heinz Knuckles, MD       zolpidem (AMBIEN) tablet 5 mg  5 mg Oral QHS PRN Norins, Heinz Knuckles, MD       Current Outpatient Medications  Medication Sig Dispense Refill   aspirin 81 MG chewable tablet Chew 1 tablet (81 mg total) by mouth every morning. 30 tablet 0   clopidogrel (PLAVIX) 75 MG tablet Take 1 tablet (75 mg total) by mouth daily. TAKE ONE TABLET ('75MG'$  TOTAL) BY MOUTH DAILY WITH BREAKFAST 90 tablet 1   acetaminophen (TYLENOL) 500 MG tablet Take 500 mg by mouth at bedtime as needed for moderate pain.     albuterol (PROVENTIL) (2.5 MG/3ML) 0.083% nebulizer solution Take 3 mLs (2.5 mg total) by nebulization every 4 (four) hours as needed for wheezing or shortness of breath. 75 mL 5   albuterol (VENTOLIN HFA) 108 (90 Base) MCG/ACT inhaler INHALE TWO PUFFS INTO THE LUNGS EVERY SIX HOURS AS NEEDED FOR WHEEZING OR SHORTNESS OF BREATH 8.5 g 5   ALPRAZolam (XANAX) 0.5 MG tablet TAKE ONE TABLET (0.'5MG'$  TOTAL) BY MOUTH DAILY AS NEEDED FOR ANXIETY 30 tablet 2   atorvastatin (LIPITOR) 80 MG tablet Take 1 tablet (80 mg total) by mouth daily. 90 tablet 3   blood glucose meter kit and supplies Dispense based on patient and insurance preference. Use up to  four times daily as directed. (FOR ICD-10 E10.9, E11.9). 1 each 0   Budeson-Glycopyrrol-Formoterol (BREZTRI AEROSPHERE)  160-9-4.8 MCG/ACT AERO Inhale 2 puffs into the lungs 2 (two) times daily. 11.8 g 12   Cholecalciferol (VITAMIN D) 50 MCG (2000 UT) CAPS Take 1 capsule (2,000 Units total) by mouth daily. 90 capsule 4   Continuous Blood Gluc Receiver (FREESTYLE LIBRE 2 READER) DEVI Check blood glucose up to 4 times daily 1 each 0   Continuous Blood Gluc Sensor (FREESTYLE LIBRE 2 SENSOR) MISC Apply one sensor to the upper arm avery 14 days. 6 each 1   cyanocobalamin (VITAMIN B12) 1000 MCG/ML injection INJECT 1 ML INTO THE MUSCLE EVERY 30 DAYS. (Patient taking differently: Inject 1,000 mcg into the skin every 30 (thirty) days.) 1 mL 11   cyclobenzaprine (FLEXERIL) 5 MG tablet Take 1 tablet (5 mg total) by mouth 3 (three) times daily as needed for muscle spasms. (Patient taking differently: Take 2.5-5 mg by mouth 3 (three) times daily as needed for muscle spasms.) 45 tablet 3   FLUoxetine (PROZAC) 40 MG capsule Take 1 capsule (40 mg total) by mouth daily. 90 capsule 3   gabapentin (NEURONTIN) 300 MG capsule TAKE ONE CAPSULE (300 MG TOTAL) BY MOUTHTWO TIMES DAILY. (Patient taking differently: Take 300 mg by mouth 3 (three) times daily. TAKE ONE CAPSULE (300 MG TOTAL) BY MOUTHTWO TIMES DAILY.) 180 capsule 1   glucose blood (ONETOUCH ULTRA) test strip Use as instructed 100 each 12   glucose monitoring kit (FREESTYLE) monitoring kit Use to check blood sugar BID as directed 1 each 0   Lancets (ONETOUCH DELICA PLUS 123XX123) MISC 1 each by Does not apply route 2 (two) times a day. 100 each 3   metFORMIN (GLUCOPHAGE-XR) 500 MG 24 hr tablet Take 1 tablet (500 mg total) by mouth daily with breakfast. 90 tablet 1   Misc. Devices MISC Please provide supplies (needle, syringe, alcohol swabs) needed for patient to self-administer B-12 injections monthly. 1 each 12   pantoprazole (PROTONIX) 40 MG tablet TAKE ONE (1)  TABLET 30 MINS BEFORE YOUR FIRST MEAL. 90 tablet 3   sacubitril-valsartan (ENTRESTO) 49-51 MG Take 1 tablet by mouth 2 (two) times daily. (Patient taking differently: Take 1 tablet by mouth daily.) 60 tablet 6   traMADol (ULTRAM) 50 MG tablet Take 1 tablet (50 mg total) by mouth every 12 (twelve) hours as needed. 30 tablet 0   Tuberculin-Allergy Syringes ([employer] TUBERCULIN SAFETY SYR) 25G X 1" 1 ML MISC USE TO ADMINISTER INJECTABLE VITAMIN B12. 1 each 11    Allergies as of 12/05/2022 - Review Complete 12/05/2022  Allergen Reaction Noted   Bee venom Swelling 05/19/2016   Codeine Anaphylaxis 04/30/2016   Penicillins Swelling and Rash 03/07/2012   Bupropion Other (See Comments) 10/04/2017   Sudafed [pseudoephedrine hcl] Rash 06/27/2013    Social History   Socioeconomic History   Marital status: Married    Spouse name: [name]   Number of children: 1   Years of education: 12   Highest education level: Not on file  Occupational History   Occupation: disabled    Comment: heart  Tobacco Use   Smoking status: Light Smoker    Packs/day: 0.50    Years: 44.00    Total pack years: 22.00    Types: Cigarettes    Start date: 05/10/1975    Passive exposure: Never   Smokeless tobacco: Never   Tobacco comments:    2 cigarettes per day 12/10/2019  Vaping Use   Vaping Use: Former  Substance and Sexual Activity   Alcohol use: Not Currently  Alcohol/week: 2.0 standard drinks of alcohol    Types: 2 Glasses of wine per week   Drug use: No   Sexual activity: Yes    Birth control/protection: Surgical  Other Topics Concern   Not on file  Social History Narrative   Disabled   Lives with husband [name]   Two dogs   Social Determinants of Health   Financial Resource Strain: Not on file  Food Insecurity: No Food Insecurity (10/30/2022)   Hunger Vital Sign    Worried About Running Out of Food in the Last Year: Never true    Ran Out of Food in the Last Year: Never true  Transportation Needs: No  Transportation Needs (10/30/2022)   PRAPARE - Hydrologist (Medical): No    Lack of Transportation (Non-Medical): No  Physical Activity: Not on file  Stress: Not on file  Social Connections: Not on file  Intimate Partner Violence: Not At Risk (10/05/2020)   Humiliation, Afraid, Rape, and Kick questionnaire    Fear of Current or Ex-Partner: No    Emotionally Abused: No    Physically Abused: No    Sexually Abused: No    Review of Systems: All systems reviewed and negative except where noted in HPI.  Physical Exam: Vital signs in last 24 hours: Temp:  [97.6 F (36.4 C)-98.5 F (36.9 C)] 98 F (36.7 C) (03/08 1540) Pulse Rate:  [80-96] 91 (03/08 1600) Resp:  [12-25] 22 (03/08 1600) BP: (135-156)/(42-56) 144/49 (03/08 1600) SpO2:  [93 %-100 %] 98 % (03/08 1600) Weight:  [60.3 kg] 60.3 kg (03/08 1111)    General:  Alert thin female in NAD Psych:  Pleasant, cooperative. Normal mood and affect Eyes: Pupils equal Ears:  Normal auditory acuity Nose: No deformity, discharge or lesions Neck:  Supple, no masses felt Lungs:  Clear to auscultation.  Heart:  Regular rate, regular rhythm.  Abdomen:  Soft, nondistended, nontender, active bowel sounds, no masses felt Rectal :  Deferred Msk: Symmetrical without gross deformities.  Neurologic:  Alert, oriented, grossly normal neurologically Extremities : No edema Skin:  Intact without significant lesions.    Intake/Output from previous day: No intake/output data recorded. Intake/Output this shift:  Total I/O In: 138.3 [I.V.:138.3] Out: -     Principal Problem:   Severe anemia Active Problems:   Acute combined systolic and diastolic heart failure (HCC)   Chronic obstructive pulmonary disease ([place])   Essential hypertension   Tobacco abuse   Type 2 diabetes mellitus ([place])   Lower limb ischemia    Tye Savoy, NP-C @  12/08/2022, 4:07 PM

## 2022-12-08 NOTE — Subjective & Objective (Signed)
Tammy Mcpherson, a 67 y/o with multiple co-morbidities including DM, HTN, COPD, h/o GI bleeding from gastric and intestinal AVMs, h/o profound iron deficiency anemia. Pre-op labs prior to angiogram LE revealed Hgb 5.8 leading to direct referral to [facility]. She denies chest pain, respiratory symptoms. She endorses fatigue. She denies seeing any signs of bleeding: no hematemesis, melena, hematochezia or BRBPR.

## 2022-12-08 NOTE — H&P (Addendum)
History and Physical    Tammy Mcpherson [id number] DOB: August 13, 1956 DOA: 12/08/2022  DOS: the patient was seen and examined on 12/08/2022  PCP: Tammy Mcpherson, Tammy Mcpherson   Patient coming from:  transferred to ED from preop, was at home prior.  I have personally briefly reviewed patient's old medical records in [facility] Link  Tammy Mcpherson, a 67 y/o with multiple co-morbidities including DM, HTN, COPD, h/o GI bleeding from gastric and intestinal AVMs, h/o profound iron deficiency anemia. Pre-op labs prior to angiogram LE revealed Hgb 5.8 leading to direct referral to [facility]. She denies chest pain, respiratory symptoms. She endorses fatigue. She denies seeing any signs of bleeding: no hematemesis, melena, hematochezia or BRBPR.    ED Course: T 97.7  156/44  HR 88  RR 12. Appeared very pale but in no distress. Lab - Hgb 4.4, WBC 15.2, plts 824, K 3,3, glucose 190, lactic acid 3.8, INR 1.2. Stool heme negative. TRH called to admit for mgt of severe anemia.  Review of Systems:  Review of Systems  Constitutional:  Positive for malaise/fatigue. Negative for chills, fever and weight loss.  Eyes: Negative.   Respiratory:  Negative for cough and shortness of breath.   Cardiovascular:  Negative for chest pain, palpitations and leg swelling.  Gastrointestinal:  Negative for abdominal pain, blood in stool, heartburn, melena, nausea and vomiting.  Musculoskeletal: Negative.   Skin: Negative.   Neurological: Negative.   Endo/Heme/Allergies:  Does not bruise/bleed easily.  Psychiatric/Behavioral: Negative.      Past Medical History:  Diagnosis Date   Anemia 04/30/2016   Anxiety    Arthritis    Cardiomyopathy (Tammy Mcpherson)    a. EF 40-45% by echo in 04/2016 b. Improved to 60-65% by repeat imaging in 2018   COPD (chronic obstructive pulmonary disease) (HCC)    Coronary artery calcification seen on CT scan    Essential hypertension    GERD (gastroesophageal reflux disease)    Headache    History of  bronchitis    History of GI bleed    History of hiatal hernia    Hyperlipidemia    Iron deficiency anemia    Peripheral vascular disease (HCC)    Pollen allergy    PSVT (paroxysmal supraventricular tachycardia)    PVC's (premature ventricular contractions)    Type 2 diabetes mellitus (Tammy Mcpherson)    Vitamin B12 deficiency     Past Surgical History:  Procedure Laterality Date   ABDOMINAL AORTOGRAM W/LOWER EXTREMITY Bilateral 05/19/2021   Procedure: ABDOMINAL AORTOGRAM W/LOWER EXTREMITY;  Surgeon: [name], MD;  Location: [place] [facility];  Service: Cardiovascular;  Laterality: Bilateral;   ABDOMINAL HYSTERECTOMY     polyps  about age 43   AIKEN OSTEOTOMY Right 07/10/2013   Procedure: Barbie Banner OSTEOTOMY RIGHT FOOT;  Surgeon: Marcheta Grammes, DPM;  Location[facility];  Service: Orthopedics;  Laterality: Right;   AMPUTATION Left 05/09/2017   Procedure: AMPUTATION 5TH TOE LEFT FOOT;  Surgeon: [name], DPM;  Location: [facility];  Service: Podiatry;  Laterality: Left;   AMPUTATION Left 06/13/2017   Procedure: AMPUTATION 2ND TOE LEFT FOOT;  Surgeon: [name], DPM;  Location: [facility];  Service: Podiatry;  Laterality: Left;   AMPUTATION Right 06/01/2021   Procedure: Right great toe and Second Toe Amputation;  Surgeon: [name], MD;  Location: [place];  Service: Vascular;  Laterality: Right;   BIOPSY  10/27/2022   Procedure: BIOPSY;  Surgeon: Daneil Dolin, MD;  [facility];  Service: Endoscopy;;   BUNIONECTOMY Right 07/10/2013   Procedure: VOGLER BUNIONECTOMY RIGHT FOOT;  Surgeon: Marcheta Grammes, DPM;  Location: [facility];  Service: Orthopedics;  Laterality: Right;   CHOLECYSTECTOMY N/A 08/22/2017   Procedure: LAPAROSCOPIC CHOLECYSTECTOMY;  Surgeon: Virl Cagey, MD;  Location[facility];  Service: General;  Laterality: N/A;   COLONOSCOPY N/A 07/07/2016   Dr. Oneida Alar; redundant left colon, diverticulosis at hepatic flexure, non-bleeding internal  hemorrhoids   ENTEROSCOPY N/A 10/27/2022   Procedure: ENTEROSCOPY;  Surgeon: Daneil Dolin, MD;  [facility];  Service: Endoscopy;  Laterality: N/A;   ESOPHAGOGASTRODUODENOSCOPY N/A 07/07/2016   Dr. Oneida Alar: many non-bleeding cratered gastric ulcers without stigmata of bleeding in gastric antrum. four 2-3 mm angioectasias without bleeding in duodenal bulb and second portion of duodenum s/p APC. Chroni gastritis on path.    ESOPHAGOGASTRODUODENOSCOPY N/A 08/03/2017   Dr. Oneida Alar: erosive gastritis, AVMs. Found a single non-bleeding angioectasia in stomach, s/p APC therapy. Four non-bleeding angioectasias in duodenum s/p APC. Non-bleeding erosive gastropathy   HOT HEMOSTASIS  10/27/2022   Procedure: HOT HEMOSTASIS (ARGON PLASMA COAGULATION/BICAP);  Surgeon: Daneil Dolin, MD;  [facility];  Service: Endoscopy;;   IR ANGIOGRAM EXTREMITY LEFT  04/24/2017   IR FEM POP ART ATHERECT INC PTA MOD SED  04/24/2017   IR INFUSION THROMBOL ARTERIAL INITIAL (MS)  04/24/2017   IR RADIOLOGIST EVAL & MGMT  12/05/2016   IR US GUIDE VASC ACCESS RIGHT  04/24/2017   LIVER BIOPSY N/A 08/22/2017   Procedure: LIVER BIOPSY;  Surgeon: Virl Cagey, MD;  [facility];  Service: General;  Laterality: N/A;   METATARSAL HEAD EXCISION Right 07/10/2013   Procedure: METATARSAL HEAD RESECTION OF DIGITS 2 AND 3 RIGHT FOOT;  Surgeon: Marcheta Grammes, DPM;  [facility];  Service: Orthopedics;  Laterality: Right;   PERIPHERAL VASCULAR INTERVENTION Right 05/19/2021   Procedure: PERIPHERAL VASCULAR INTERVENTION;  Surgeon: [name], MD;  Location: [place] [facility];  Service: Cardiovascular;  Laterality: Right;   PROXIMAL INTERPHALANGEAL FUSION (PIP) Right 07/10/2013   Procedure: ARTHRODESIS PIPJ  2ND DIGIT RIGHT FOOT;  Surgeon: Marcheta Grammes, DPM;  [facility];  Service: Orthopedics;  Laterality: Right;    Soc Hx -  1st marriage very short - divorce, 2nd marriage - widowed, 3  rd marriage a12 years. She has one daughter. Just had first grandchild. Work - worked in [employer] all her life, now retired   reports that she has been smoking cigarettes. She started smoking about 47 years ago. She has a 22.00 pack-year smoking history. She has never been exposed to tobacco smoke. She has never used smokeless tobacco. She reports that she does not currently use alcohol after a past usage of about 2.0 standard drinks of alcohol per week. She reports that she does not use drugs.  Allergies  Allergen Reactions   Bee Venom Swelling   Codeine Anaphylaxis    Tongue swelled   Penicillins Swelling and Rash    Has patient had a PCN reaction causing immediate rash, facial/tongue/throat swelling, SOB or lightheadedness with hypotension: No, delayed Has patient had a PCN reaction causing severe rash involving mucus membranes or skin necrosis: No Has patient had a PCN reaction that required hospitalization No Has patient had a PCN reaction occurring within the last 10 years: No If all of the above answers are "NO", then may proceed with Cephalosporin use.    Bupropion Other (See Comments)    "  Made my skin crawl"   Sudafed [Pseudoephedrine Hcl] Rash    Family History  Adopted: Yes  Problem Relation Age of Onset   Hypertension Father    Coronary artery disease Sister    Ulcers Maternal Grandmother    Colon cancer Neg Hx        unknown, was adopted    Prior to Admission medications   Medication Sig Start Date End Date Taking? Authorizing Provider  aspirin 81 MG chewable tablet Chew 1 tablet (81 mg total) by mouth every morning. 10/30/22  Yes Manuella Ghazi, Pratik D, DO  clopidogrel (PLAVIX) 75 MG tablet Take 1 tablet (75 mg total) by mouth daily. TAKE ONE TABLET ('75MG'$  TOTAL) BY MOUTH DAILY WITH BREAKFAST 10/30/22  Yes Manuella Ghazi, Pratik D, DO  acetaminophen (TYLENOL) 500 MG tablet Take 500 mg by mouth at bedtime as needed for moderate pain.    [provider]  albuterol (PROVENTIL)  (2.5 MG/3ML) 0.083% nebulizer solution Take 3 mLs (2.5 mg total) by nebulization every 4 (four) hours as needed for wheezing or shortness of breath. 04/26/22   [name], NP  albuterol (VENTOLIN HFA) 108 (90 Base) MCG/ACT inhaler INHALE TWO PUFFS INTO THE LUNGS EVERY SIX HOURS AS NEEDED FOR WHEEZING OR SHORTNESS OF BREATH 04/26/22   [name], NP  ALPRAZolam Duanne Moron) 0.5 MG tablet TAKE ONE TABLET (0.'5MG'$  TOTAL) BY MOUTH DAILY AS NEEDED FOR ANXIETY 04/25/22   [name], NP  atorvastatin (LIPITOR) 80 MG tablet Take 1 tablet (80 mg total) by mouth daily. 06/07/22   Satira Sark, MD  blood glucose meter kit and supplies Dispense based on patient and insurance preference. Use up to four times daily as directed. (FOR ICD-10 E10.9, E11.9). 11/22/20   [name], NP  Budeson-Glycopyrrol-Formoterol (BREZTRI AEROSPHERE) 160-9-4.8 MCG/ACT AERO Inhale 2 puffs into the lungs 2 (two) times daily. 11/15/20   Garner Nash, DO  Cholecalciferol (VITAMIN D) 50 MCG (2000 UT) CAPS Take 1 capsule (2,000 Units total) by mouth daily. 03/15/22   Derek Jack, MD  Continuous Blood Gluc Receiver (FREESTYLE LIBRE 2 READER) DEVI Check blood glucose up to 4 times daily 10/11/22   Tammy Coke, PA  Continuous Blood Gluc Sensor (FREESTYLE LIBRE 2 SENSOR) MISC Apply one sensor to the upper arm avery 14 days. 04/26/22   [name], NP  cyanocobalamin (VITAMIN B12) 1000 MCG/ML injection INJECT 1 ML INTO THE MUSCLE EVERY 30 DAYS. Patient taking differently: Inject 1,000 mcg into the skin every 30 (thirty) days. 08/09/22   Derek Jack, MD  cyclobenzaprine (FLEXERIL) 5 MG tablet Take 1 tablet (5 mg total) by mouth 3 (three) times daily as needed for muscle spasms. Patient taking differently: Take 2.5-5 mg by mouth 3 (three) times daily as needed for muscle spasms. 04/25/22   [name], NP  FLUoxetine (PROZAC) 40 MG capsule Take 1 capsule (40 mg total) by mouth daily. 04/26/22   [name], NP  gabapentin (NEURONTIN) 300 MG capsule TAKE ONE CAPSULE (300 MG TOTAL) BY MOUTHTWO TIMES DAILY. Patient taking differently: Take 300 mg by mouth 3 (three) times daily. TAKE ONE CAPSULE (300 MG TOTAL) BY MOUTHTWO TIMES DAILY. 04/26/22   [name], NP  glucose blood [facility] ULTRA) test strip Use as instructed 02/26/19   Letta Median K, DO  glucose monitoring kit (FREESTYLE) monitoring kit Use to check blood sugar BID as directed 01/24/19   Cirigliano, Stanton Kidney K, DO  Lancets (ONETOUCH DELICA PLUS [id number]) MISC 1 each by Does not apply route 2 (two)  times a day. 02/26/19   Cirigliano, Garvin Fila, DO  metFORMIN (GLUCOPHAGE-XR) 500 MG 24 hr tablet Take 1 tablet (500 mg total) by mouth daily with breakfast. 10/25/22   Tammy Coke, PA  Misc. Devices MISC Please provide supplies (needle, syringe, alcohol swabs) needed for patient to self-administer B-12 injections monthly. 01/07/20   Derek Jack, MD  pantoprazole (PROTONIX) 40 MG tablet TAKE ONE (1) TABLET 30 MINS BEFORE YOUR FIRST MEAL. 04/26/22   [name], NP  sacubitril-valsartan (ENTRESTO) 49-51 MG Take 1 tablet by mouth 2 (two) times daily. Patient taking differently: Take 1 tablet by mouth daily. 06/07/22   Satira Sark, MD  traMADol (ULTRAM) 50 MG tablet Take 1 tablet (50 mg total) by mouth every 12 (twelve) hours as needed. 11/29/22   Tammy Coke, PA  Tuberculin-Allergy Syringes Flossie Buffy TUBERCULIN SAFETY SYR) 25G X 1" 1 ML MISC USE TO ADMINISTER INJECTABLE VITAMIN B12. 08/09/22   Derek Jack, MD    Physical Exam: Vitals:   12/08/22 1111 12/08/22 1130 12/08/22 1200 12/08/22 1441  BP:  (!) 144/54 (!) 149/51 (!) 142/56  Pulse:  92 88 94  Resp:  15 (!) 25 (!) 24  Temp:    98.5 F (36.9 C)  TempSrc:    Oral  SpO2:  99% 100% 99%  Weight: 60.3 kg     Height: '5\' 3"'$  (1.6 m)       Physical Exam Vitals and nursing note reviewed.  Constitutional:      General: She is not in acute distress.     Appearance: She is ill-appearing. She is not toxic-appearing.     Comments: Very pale - ashen. MIldly overweight  HENT:     Head: Normocephalic and atraumatic.     Mouth/Throat:     Mouth: Mucous membranes are moist.     Pharynx: Oropharynx is clear.  Eyes:     Extraocular Movements: Extraocular movements intact.     Conjunctiva/sclera: Conjunctivae normal.     Pupils: Pupils are equal, round, and reactive to light.  Cardiovascular:     Rate and Rhythm: Normal rate and regular rhythm.     Heart sounds: Normal heart sounds. No murmur heard.    Comments: 2+ radial pulses, trace DP pulses Pulmonary:     Effort: Pulmonary effort is normal.     Breath sounds: Normal breath sounds.  Abdominal:     General: Bowel sounds are normal. There is no distension.     Palpations: Abdomen is soft.     Tenderness: There is no abdominal tenderness. There is no guarding or rebound.  Musculoskeletal:        General: Tenderness and deformity present. No swelling. Normal range of motion.     Cervical back: Normal range of motion and neck supple. No rigidity.     Comments: Feet very tender to touch. Missing great toe and 2nd toe right foot. Missing toes left foot   Skin:    General: Skin is warm and dry.     Findings: No bruising or lesion.     Comments: No vascular ulcers  Neurological:     General: No focal deficit present.     Mental Status: She is alert and oriented to person, place, and time.     Cranial Nerves: No cranial nerve deficit.     Motor: No weakness.  Psychiatric:        Mood and Affect: Mood normal.        Behavior: Behavior normal.  Thought Content: Thought content normal.      Labs on Admission: I have personally reviewed following labs and imaging studies  CBC: Recent Labs  Lab 12/08/22 1020 12/08/22 1029  WBC  --  15.2*  HGB 5.8* 4.4*  HCT 17.0* 16.5*  MCV  --  74.7*  PLT  --  [id number]*   Basic Metabolic Panel: Recent Labs  Lab 12/08/22 1020 12/08/22 1035  NA  133* 135  K 3.3* 3.3*  CL 95* 96*  CO2  --  23  GLUCOSE 190* 191*  BUN 7* 7*  CREATININE 0.60 0.84  CALCIUM  --  8.6*   GFR: Estimated Creatinine Clearance: 55.2 mL/min (by C-G formula based on SCr of 0.84 mg/dL). Liver Function Tests: No results for input(s): "AST", "ALT", "ALKPHOS", "BILITOT", "PROT", "ALBUMIN" in the last 168 hours. No results for input(s): "LIPASE", "AMYLASE" in the last 168 hours. No results for input(s): "AMMONIA" in the last 168 hours. Coagulation Profile: Recent Labs  Lab 12/08/22 1125  INR 1.2   Cardiac Enzymes: No results for input(s): "CKTOTAL", "CKMB", "CKMBINDEX", "TROPONINI" in the last 168 hours. BNP (last 3 results) No results for input(s): "PROBNP" in the last 8760 hours. HbA1C: No results for input(s): "HGBA1C" in the last 72 hours. CBG: No results for input(s): "GLUCAP" in the last 168 hours. Lipid Profile: No results for input(s): "CHOL", "HDL", "LDLCALC", "TRIG", "CHOLHDL", "LDLDIRECT" in the last 72 hours. Thyroid Function Tests: No results for input(s): "TSH", "T4TOTAL", "FREET4", "T3FREE", "THYROIDAB" in the last 72 hours. Anemia Panel: No results for input(s): "VITAMINB12", "FOLATE", "FERRITIN", "TIBC", "IRON", "RETICCTPCT" in the last 72 hours. Urine analysis:    Component Value Date/Time   COLORURINE STRAW (A) 10/26/2022 1540   APPEARANCEUR CLEAR 10/26/2022 1540   LABSPEC 1.004 (L) 10/26/2022 1540   PHURINE 6.0 10/26/2022 1540   GLUCOSEU NEGATIVE 10/26/2022 1540   GLUCOSEU NEGATIVE 04/26/2022 1508   HGBUR SMALL (A) 10/26/2022 1540   BILIRUBINUR NEGATIVE 10/26/2022 1540   BILIRUBINUR negative 06/17/2020 1534   KETONESUR NEGATIVE 10/26/2022 1540   PROTEINUR NEGATIVE 10/26/2022 1540   UROBILINOGEN 0.2 04/26/2022 1508   NITRITE NEGATIVE 10/26/2022 1540   LEUKOCYTESUR TRACE (A) 10/26/2022 1540    Radiological Exams on Admission: I have personally reviewed images CT ABDOMEN PELVIS W CONTRAST  Result Date:  12/08/2022 CLINICAL DATA:  Acute generalized abdominal pain. EXAM: CT ABDOMEN AND PELVIS WITH CONTRAST TECHNIQUE: Multidetector CT imaging of the abdomen and pelvis was performed using the standard protocol following bolus administration of intravenous contrast. RADIATION DOSE REDUCTION: This exam was performed according to the departmental dose-optimization program which includes automated exposure control, adjustment of the mA and/or kV according to patient size and/or use of iterative reconstruction technique. CONTRAST:  68m OMNIPAQUE IOHEXOL 350 MG/ML SOLN COMPARISON:  June 17, 2020. FINDINGS: Lower chest: No acute abnormality. Hepatobiliary: No focal liver abnormality is seen. Status post cholecystectomy. No biliary dilatation. Pancreas: Unremarkable. No pancreatic ductal dilatation or surrounding inflammatory changes. Spleen: Normal in size without focal abnormality. Adrenals/Urinary Tract: Adrenal glands are unremarkable. Kidneys are normal, without renal calculi, focal lesion, or hydronephrosis. Bladder is unremarkable. Stomach/Bowel: Stomach is within normal limits. Appendix appears normal. No evidence of bowel wall thickening, distention, or inflammatory changes. Vascular/Lymphatic: Aortic atherosclerosis. No enlarged abdominal or pelvic lymph nodes. Reproductive: Status post hysterectomy. No adnexal masses. Other: No abdominal wall hernia or abnormality. No abdominopelvic ascites. Musculoskeletal: No acute or significant osseous findings. IMPRESSION: No acute abnormality seen in the abdomen or pelvis. Aortic Atherosclerosis (ICD10-I70.0).  Electronically Signed   By: Marijo Conception M.D.   On: 12/08/2022 13:33    EKG: I have personally reviewed EKG: Sinus rhythm, no acute changes  Assessment/Plan Principal Problem:   Severe anemia Active Problems:   Tobacco abuse   Type 2 diabetes mellitus (HCC)   Acute combined systolic and diastolic heart failure (HCC)   Chronic obstructive pulmonary  disease (HCC)   Essential hypertension   Lower limb ischemia    Assessment and Plan: * Severe anemia Patient with h/o multiple AVMs gastric and intestinal which has been a source of bleeding in the past. She has also suffered from severe iron deficiency requiring Iron infusions in the past - last about a year ago. She is followed by hematology at [facility]. She has not had any signs of GI bleeding and per EDP exam stool was negative for blood.  Plan Transfuse to Hgb of at least 8 (3 units and recheck)  Check Iron level - if deficient Iron infusion  No indication of GI bleeding - will consult GI for completeness  Type 2 diabetes mellitus (HCC) Last A1C 1 month ago was 7.3%. She does take metformin. She has considerable foot pain from PAD and possibly DM neuropathy. She gets good relief from tramadol.  Plan Continue home regimen  Ss coverage  Continue tramadol  Tobacco abuse Patient strongly advised to NOT smoke at all. Smokes less than 10 cigarettes a day- no indication for nicotine replacement  Essential hypertension BP mildly elevated at admission. Has regular medical care and reports she is generally well controlled  Chronic obstructive pulmonary disease ([place]) Patient continues to smoke. Denies wheezing or respirtory distress  Plan Continue home regimen  Advised to stop smoking  Acute combined systolic and diastolic heart failure ([place]) Patient stable despite severe anemia. Last ECHO 06/09/22 with EF 60-65%, grade I DD.  Plan Continue home regimen  Lower limb ischemia Patient has had several toe amputations both feet. She is followed by Dr. Carmelia Roller, vascular surgeon. She was to have LE angiogram for staging. Study cancelled due to pro-op lab revealing Hgb 5.8  Plan Treat anemia with a goal of Hgb > 8g  Will notify radiology - may be able to schedule angiogram for Monday, March 11th       DVT prophylaxis:  TEDs Code Status: DNR/DNI(Do NOT Intubate)  discussed with patient who was clear about her wishes. Husband was present Family Communication: husband present during interview and exam. Agrees with Dx and Tx plan  Disposition Plan: home when stable  Consults called: GI - [place]  Admission status: Observation admission  Med-Surg   Adella Hare, MD [facility] 12/08/2022, 3:30 PM

## 2022-12-08 NOTE — Consult Note (Signed)
[facility] [place]  ASSESSMENT / PLAN: 67 y.o. female with bilateral chronic limb threatening ischemia. Hgb this morning on pre-angiogram labs was 5.8. Patient symptomatic. Recent admission for GIB. Will transfer to ER for workup.  CHIEF COMPLAINT: painful feet  HISTORY OF PRESENT ILLNESS: Tammy Mcpherson is a 67 y.o. female with dry gangrene of the right second toe and ulceration of the right great toe. She has hx of RLE arteriogram with right popliteal angioplasty and stenting and right AT angioplasty by Dr. Stanford Breed on 05/19/2021 for rest pain and gangrene.    Her Arteriogram findings of the RLE were : Common femoral artery: patent without flow limiting stenosis  Profunda femoris artery: patent without flow limiting stenosis   Superficial femoral artery: diffusely diseased Popliteal artery: above knee diffusely diseased (>95% stenosis). Behind knee and below knee patent. Anterior tibial artery: chronic occlusion at the origin causing critical stenosis of terminal popliteal / proximal TP trunk. Patent beyond the occlusion. Tibioperoneal trunk: patent without flow limiting stenosis Peroneal artery: patent without flow limiting stenosis Posterior tibial artery: patent without flow limiting stenosis Pedal circulation: pedal arch intact   On 05/27/2021, her duplex revealed adequate perfusion following recent revascularization and she was scheduled for right 1st and 2nd toe amputation, which she underwent on 06/01/2021 by Dr. Stanford Breed.   He did have to explant a screw in the right great toe.    Pt was last seen 09/06/2021 and at that time, her amputation sites were healing.  She was still smoking.  She was compliant with her asa/statin/Plavix.  ABI on the right was improved.  She was scheduled for 9 month follow up.    At follow up she was found to have bilateral foot ulceration. Non-invasive testing was highly concerning. She was offered angiogram.   The pt is on a statin for  cholesterol management.    The pt is  on an aspirin.    Other AC:  Plavix The pt is on ARB for hypertension.  The pt does  have diabetes. Tobacco hx:  current  Past Medical History:  Diagnosis Date   Anemia 04/30/2016   Anxiety    Arthritis    Cardiomyopathy ([place])    a. EF 40-45% by echo in 04/2016 b. Improved to 60-65% by repeat imaging in 2018   COPD (chronic obstructive pulmonary disease) (HCC)    Coronary artery calcification seen on CT scan    Essential hypertension    GERD (gastroesophageal reflux disease)    Headache    History of bronchitis    History of GI bleed    History of hiatal hernia    Hyperlipidemia    Iron deficiency anemia    Peripheral vascular disease (HCC)    Pollen allergy    PSVT (paroxysmal supraventricular tachycardia)    PVC's (premature ventricular contractions)    Type 2 diabetes mellitus ([place])    Vitamin B12 deficiency     Past Surgical History:  Procedure Laterality Date   ABDOMINAL AORTOGRAM W/LOWER EXTREMITY Bilateral 05/19/2021   Procedure: ABDOMINAL AORTOGRAM W/LOWER EXTREMITY;  Surgeon: [name], MD;  Location: [facility];  Service: Cardiovascular;  Laterality: Bilateral;   ABDOMINAL HYSTERECTOMY     polyps  about age 37   AIKEN OSTEOTOMY Right 07/10/2013   Procedure: Barbie Banner OSTEOTOMY RIGHT FOOT;  Surgeon: Marcheta Grammes, DPM;  Location: [facility];  Service: Orthopedics;  Laterality: Right;   AMPUTATION Left 05/09/2017   Procedure: AMPUTATION 5TH TOE LEFT  FOOT;  Surgeon: [name], DPM;  Location: [facility];  Service: Podiatry;  Laterality: Left;   AMPUTATION Left 06/13/2017   Procedure: AMPUTATION 2ND TOE LEFT FOOT;  Surgeon: [name], DPM;  Location: [facility];  Service: Podiatry;  Laterality: Left;   AMPUTATION Right 06/01/2021   Procedure: Right great toe and Second Toe Amputation;  Surgeon: [name], MD;  Location: [facility];  Service: Vascular;  Laterality: Right;   BIOPSY  10/27/2022   Procedure:  BIOPSY;  Surgeon: Daneil Dolin, MD;  [facility];  Service: Endoscopy;;   BUNIONECTOMY Right 07/10/2013   Procedure: Maryfrances Bunnell RIGHT FOOT;  Surgeon: Marcheta Grammes, DPM;  Location: [facility];  Service: Orthopedics;  Laterality: Right;   CHOLECYSTECTOMY N/A 08/22/2017   Procedure: LAPAROSCOPIC CHOLECYSTECTOMY;  Surgeon: Virl Cagey, MD;  Location: [facility];  Service: General;  Laterality: N/A;   COLONOSCOPY N/A 07/07/2016   Dr. Oneida Alar; redundant left colon, diverticulosis at hepatic flexure, non-bleeding internal hemorrhoids   ENTEROSCOPY N/A 10/27/2022   Procedure: ENTEROSCOPY;  Surgeon: Daneil Dolin, MD;  [facility];  Service: Endoscopy;  Laterality: N/A;   ESOPHAGOGASTRODUODENOSCOPY N/A 07/07/2016   Dr. Oneida Alar: many non-bleeding cratered gastric ulcers without stigmata of bleeding in gastric antrum. four 2-3 mm angioectasias without bleeding in duodenal bulb and second portion of duodenum s/p APC. Chroni gastritis on path.    ESOPHAGOGASTRODUODENOSCOPY N/A 08/03/2017   Dr. Oneida Alar: erosive gastritis, AVMs. Found a single non-bleeding angioectasia in stomach, s/p APC therapy. Four non-bleeding angioectasias in duodenum s/p APC. Non-bleeding erosive gastropathy   HOT HEMOSTASIS  10/27/2022   Procedure: HOT HEMOSTASIS (ARGON PLASMA COAGULATION/BICAP);  Surgeon: Daneil Dolin, MD;  [facility];  Service: Endoscopy;;   IR ANGIOGRAM EXTREMITY LEFT  04/24/2017   IR FEM POP ART ATHERECT INC PTA MOD SED  04/24/2017   IR INFUSION THROMBOL ARTERIAL INITIAL (MS)  04/24/2017   IR RADIOLOGIST EVAL & MGMT  12/05/2016   IR US GUIDE VASC ACCESS RIGHT  04/24/2017   LIVER BIOPSY N/A 08/22/2017   Procedure: LIVER BIOPSY;  Surgeon: Virl Cagey, MD;  [facility]: [facility];  Service: General;  Laterality: N/A;   METATARSAL HEAD EXCISION Right 07/10/2013   Procedure: METATARSAL HEAD RESECTION OF DIGITS 2 AND 3 RIGHT FOOT;  Surgeon: Marcheta Grammes, DPM;   Location: [facility];  Service: Orthopedics;  Laterality: Right;   PERIPHERAL VASCULAR INTERVENTION Right 05/19/2021   Procedure: PERIPHERAL VASCULAR INTERVENTION;  Surgeon: [name], MD;  Location: [facility];  Service: Cardiovascular;  Laterality: Right;   PROXIMAL INTERPHALANGEAL FUSION (PIP) Right 07/10/2013   Procedure: ARTHRODESIS PIPJ  2ND DIGIT RIGHT FOOT;  Surgeon: Marcheta Grammes, DPM;  Location: [facility];  Service: Orthopedics;  Laterality: Right;    Family History  Adopted: Yes  Problem Relation Age of Onset   Hypertension Father    Coronary artery disease Sister    Ulcers Maternal Grandmother    Colon cancer Neg Hx        unknown, was adopted    Social History   Socioeconomic History   Marital status: Married    Spouse name: [name]   Number of children: 1   Years of education: 12   Highest education level: Not on file  Occupational History   Occupation: disabled    Comment: heart  Tobacco Use   Smoking status: Light Smoker    Packs/day: 0.50    Years: 44.00  Total pack years: 22.00    Types: Cigarettes    Start date: 05/10/1975    Passive exposure: Never   Smokeless tobacco: Never   Tobacco comments:    2 cigarettes per day 12/10/2019  Vaping Use   Vaping Use: Former  Substance and Sexual Activity   Alcohol use: Not Currently    Alcohol/week: 2.0 standard drinks of alcohol    Types: 2 Glasses of wine per week   Drug use: No   Sexual activity: Yes    Birth control/protection: Surgical  Other Topics Concern   Not on file  Social History Narrative   Disabled   Lives with husband [name]   Two dogs   Social Determinants of Health   Financial Resource Strain: Not on file  Food Insecurity: No Food Insecurity (10/30/2022)   Hunger Vital Sign    Worried About Running Out of Food in the Last Year: Never true    Ran Out of Food in the Last Year: Never true  Transportation Needs: No Transportation Needs (10/30/2022)   PRAPARE - Armed forces logistics/support/administrative officer (Medical): No    Lack of Transportation (Non-Medical): No  Physical Activity: Not on file  Stress: Not on file  Social Connections: Not on file  Intimate Partner Violence: Not At Risk (10/05/2020)   Humiliation, Afraid, Rape, and Kick questionnaire    Fear of Current or Ex-Partner: No    Emotionally Abused: No    Physically Abused: No    Sexually Abused: No    Allergies  Allergen Reactions   Bee Venom Swelling   Codeine Anaphylaxis    Tongue swelled   Penicillins Swelling and Rash    Has patient had a PCN reaction causing immediate rash, facial/tongue/throat swelling, SOB or lightheadedness with hypotension: No, delayed Has patient had a PCN reaction causing severe rash involving mucus membranes or skin necrosis: No Has patient had a PCN reaction that required hospitalization No Has patient had a PCN reaction occurring within the last 10 years: No If all of the above answers are "NO", then may proceed with Cephalosporin use.    Bupropion Other (See Comments)    "Made my skin crawl"   Sudafed [Pseudoephedrine Hcl] Rash    No current facility-administered medications for this encounter.   Current Outpatient Medications  Medication Sig Dispense Refill   aspirin 81 MG chewable tablet Chew 1 tablet (81 mg total) by mouth every morning. 30 tablet 0   clopidogrel (PLAVIX) 75 MG tablet Take 1 tablet (75 mg total) by mouth daily. TAKE ONE TABLET ('75MG'$  TOTAL) BY MOUTH DAILY WITH BREAKFAST 90 tablet 1   acetaminophen (TYLENOL) 500 MG tablet Take 500 mg by mouth at bedtime as needed for moderate pain.     albuterol (PROVENTIL) (2.5 MG/3ML) 0.083% nebulizer solution Take 3 mLs (2.5 mg total) by nebulization every 4 (four) hours as needed for wheezing or shortness of breath. 75 mL 5   albuterol (VENTOLIN HFA) 108 (90 Base) MCG/ACT inhaler INHALE TWO PUFFS INTO THE LUNGS EVERY SIX HOURS AS NEEDED FOR WHEEZING OR SHORTNESS OF BREATH 8.5 g 5   ALPRAZolam (XANAX) 0.5 MG  tablet TAKE ONE TABLET (0.'5MG'$  TOTAL) BY MOUTH DAILY AS NEEDED FOR ANXIETY 30 tablet 2   atorvastatin (LIPITOR) 80 MG tablet Take 1 tablet (80 mg total) by mouth daily. 90 tablet 3   blood glucose meter kit and supplies Dispense based on patient and insurance preference. Use up to four times daily as directed. (FOR  ICD-10 E10.9, E11.9). 1 each 0   Budeson-Glycopyrrol-Formoterol (BREZTRI AEROSPHERE) 160-9-4.8 MCG/ACT AERO Inhale 2 puffs into the lungs 2 (two) times daily. 11.8 g 12   Cholecalciferol (VITAMIN D) 50 MCG (2000 UT) CAPS Take 1 capsule (2,000 Units total) by mouth daily. 90 capsule 4   Continuous Blood Gluc Receiver (FREESTYLE LIBRE 2 READER) DEVI Check blood glucose up to 4 times daily 1 each 0   Continuous Blood Gluc Sensor (FREESTYLE LIBRE 2 SENSOR) MISC Apply one sensor to the upper arm avery 14 days. 6 each 1   cyanocobalamin (VITAMIN B12) 1000 MCG/ML injection INJECT 1 ML INTO THE MUSCLE EVERY 30 DAYS. (Patient taking differently: Inject 1,000 mcg into the skin every 30 (thirty) days.) 1 mL 11   cyclobenzaprine (FLEXERIL) 5 MG tablet Take 1 tablet (5 mg total) by mouth 3 (three) times daily as needed for muscle spasms. (Patient taking differently: Take 2.5-5 mg by mouth 3 (three) times daily as needed for muscle spasms.) 45 tablet 3   FLUoxetine (PROZAC) 40 MG capsule Take 1 capsule (40 mg total) by mouth daily. 90 capsule 3   gabapentin (NEURONTIN) 300 MG capsule TAKE ONE CAPSULE (300 MG TOTAL) BY MOUTHTWO TIMES DAILY. (Patient taking differently: Take 300 mg by mouth 3 (three) times daily. TAKE ONE CAPSULE (300 MG TOTAL) BY MOUTHTWO TIMES DAILY.) 180 capsule 1   glucose blood (ONETOUCH ULTRA) test strip Use as instructed 100 each 12   glucose monitoring kit (FREESTYLE) monitoring kit Use to check blood sugar BID as directed 1 each 0   Lancets (ONETOUCH DELICA PLUS 123XX123) MISC 1 each by Does not apply route 2 (two) times a day. 100 each 3   metFORMIN (GLUCOPHAGE-XR) 500 MG 24 hr  tablet Take 1 tablet (500 mg total) by mouth daily with breakfast. 90 tablet 1   Misc. Devices MISC Please provide supplies (needle, syringe, alcohol swabs) needed for patient to self-administer B-12 injections monthly. 1 each 12   pantoprazole (PROTONIX) 40 MG tablet TAKE ONE (1) TABLET 30 MINS BEFORE YOUR FIRST MEAL. 90 tablet 3   sacubitril-valsartan (ENTRESTO) 49-51 MG Take 1 tablet by mouth 2 (two) times daily. (Patient taking differently: Take 1 tablet by mouth daily.) 60 tablet 6   traMADol (ULTRAM) 50 MG tablet Take 1 tablet (50 mg total) by mouth every 12 (twelve) hours as needed. 30 tablet 0   Tuberculin-Allergy Syringes (ULTICARE TUBERCULIN SAFETY SYR) 25G X 1" 1 ML MISC USE TO ADMINISTER INJECTABLE VITAMIN B12. 1 each 11    PHYSICAL EXAM Vitals:   12/08/22 1016  BP: (!) 135/42  Pulse: 89  Resp: 18  Temp: 97.6 F (36.4 C)  TempSrc: Temporal  SpO2: 100%  Weight: 60.3 kg  Height: '5\' 1"'$  (1.549 m)    General:  WDWN in NAD; vital signs documented above Gait: Not observed HENT: WNL, normocephalic Pulmonary: normal non-labored breathing , without wheezing Abdomen: soft Skin: without rashes Vascular Exam/Pulses: Bilateral femoral pulses are 1+ bilaterally Bilateral radial pulses 2+ bilaterally Pedal pulses are not palpable Extremities:    Left foot  Left medial foot  Left plantar foot    Right foot    PERTINENT LABORATORY AND RADIOLOGIC DATA  Most recent CBC    Latest Ref Rng & Units 12/08/2022   10:20 AM 11/06/2022    6:21 PM 10/29/2022    4:05 AM  CBC  WBC 4.0 - 10.5 K/uL  14.8  22.2   Hemoglobin 12.0 - 15.0 g/dL 5.8  9.3  8.0  Hematocrit 36.0 - 46.0 % 17.0  31.0  26.8   Platelets 150 - 400 K/uL  793  447      Most recent CMP    Latest Ref Rng & Units 12/08/2022   10:20 AM 11/06/2022    6:21 PM 10/29/2022    4:05 AM  CMP  Glucose 70 - 99 mg/dL 190  160  118   BUN 8 - 23 mg/dL '7  14  7   '$ Creatinine 0.44 - 1.00 mg/dL 0.60  0.85  0.49   Sodium 135 - 145  mmol/L 133  138  135   Potassium 3.5 - 5.1 mmol/L 3.3  3.2  3.2   Chloride 98 - 111 mmol/L 95  100  99   CO2 22 - 32 mmol/L  26  25   Calcium 8.9 - 10.3 mg/dL  9.1  8.1     Renal function Estimated Creatinine Clearance: 58.4 mL/min (by C-G formula based on SCr of 0.6 mg/dL).  Hgb A1c MFr Bld (%)  Date Value  10/25/2022 7.3 (H)    LDL Cholesterol (Calc)  Date Value Ref Range Status  08/12/2021 87 mg/dL (calc) Final    Comment:    Reference range: <100 . Desirable range <100 mg/dL for primary prevention;   <70 mg/dL for patients with CHD or diabetic patients  with > or = 2 CHD risk factors. Marland Kitchen LDL-C is now calculated using the Martin-Hopkins  calculation, which is a validated novel method providing  better accuracy than the Friedewald equation in the  estimation of LDL-C.  Cresenciano Genre et al. Annamaria Helling. [id number]): [id number]  (http://education.[facility].com/faq/FAQ164)    Direct LDL  Date Value Ref Range Status  10/25/2022 64.0 mg/dL Final    Comment:    Optimal:  <100 mg/dLNear or Above Optimal:  100-129 mg/dLBorderline High:  130-159 mg/dLHigh:  160-189 mg/dLVery High:  >190 mg/dL    Yevonne Aline. Stanford Breed, MD FACS [facility] Phone Number: [phone] 12/08/2022 10:53 AM   Total time spent on preparing this encounter including chart review, data review, collecting history, examining the patient, coordinating care for this established patient, 30 minutes.  Portions of this report may have been transcribed using voice recognition software.  Every effort has been made to ensure accuracy; however, inadvertent computerized transcription errors may still be present.

## 2022-12-08 NOTE — ED Notes (Signed)
No transfusion reaction suspected, V/S are within pt's baseline and pt denies any side or adverse effects.

## 2022-12-08 NOTE — ED Triage Notes (Signed)
Pt arrived from preop for angiogram. Unable to perform angiogram d/t low hbg. Pt is A&Ox4. Pt is pale.

## 2022-12-08 NOTE — H&P (View-Only) (Signed)
Consultation Note   Referring Provider:  [facility] PCP: Inda Coke, [place] Primary Gastroenterologist:    [facility]    Reason for consultation: anemia   Hospital Day: 1  ASSESSMENT  # Recurrent anemia, FOBT negative. History of gastrointestinal AVMs 67 yo female on plavix with profound iron deficiency anemia and a history of  multiple gastric / intestinal AVMs. She is s/p APC of multiple ( 20) small bowel AVMs in late January. IDA followed by Hematology and receives periodic iron infusions.   # PAD.  Having lower leg pain. Was for outpatient angiogram today but put on hold due to recurrent severe anemia.   # GERD.  Symptoms controlled on daily PPI  # COPD /  ongoing tobacco use  # IBS-D   PLAN:   -Can have regular diet. NPO after MN -Scheduled for enteroscopy to be done tomorrow. The risks and benefits of enteroscopy with possible biopsies were discussed with the patient who agrees to proceed.  -Blood transfusion in progress. Getting 1 of 3 units right now -Continue daily PPI   HPI:  Patient is a 67 y.o. year old female with a past medical history of  GERD, IBS-D, lactose intolerance, PUD, IDA followed by Hematology, gastric / duodenal AVMs s/p ablation, DM, HTN, PAD, cardiomyopathy, COPD  See PMH for any additional medical problems.  Cortina was hospitalized 10/26/22  with recurrent anemia / melena on ASA and plavix. Hgb was 5.3, down from 12 in October. She was transfused 2 u PRBCs with improvement in hgb into low 8 range  Neidra had pre-op labs today in preparation for angiogram ordered by Vascular Surgery. Found to have recurrent anemia.  Today hgb 5.8. she hasn't had any overt GI bleeding. She has not abdominal pain, N/V or other GI symptoms.    Previous GI History / Evaluation :   Most recent EGD 10/27/22 -- Normal esophagus. Small hiatal hernia. Abnormal appearing gastric mucosa of uncertain  significance - status post gastric biopsy -20 duodenal AVMs (2 actively oozing) status post APC's sealing FINAL MICROSCOPIC DIAGNOSIS:  A. STOMACH, BIOPSY:  Gastric mucosa with focal erosion, hyperemia and reactive changes.  No Helicobacter pylori identified.    Most recent colonoscopy Oct 2017 -- Non-thrombosed external hemorrhoids found on digital rectal exam. - Redundant LEFT colon. - Diverticulosis at the hepatic flexure. - The examined portion of the ileum was normal. - Non-bleeding internal hemorrhoids. - NO SOURCE FOR FEDA IDENTIFIED    Recent Imaging and Labs: CT ABDOMEN PELVIS W CONTRAST  Result Date: 12/08/2022 CLINICAL DATA:  Acute generalized abdominal pain. EXAM: CT ABDOMEN AND PELVIS WITH CONTRAST TECHNIQUE: Multidetector CT imaging of the abdomen and pelvis was performed using the standard protocol following bolus administration of intravenous contrast. RADIATION DOSE REDUCTION: This exam was performed according to the departmental dose-optimization program which includes automated exposure control, adjustment of the mA and/or kV according to patient size and/or use of iterative reconstruction technique. CONTRAST:  34m OMNIPAQUE IOHEXOL 350 MG/ML SOLN COMPARISON:  June 17, 2020. FINDINGS: Lower chest: No acute abnormality. Hepatobiliary: No focal liver abnormality is seen. Status post cholecystectomy. No biliary dilatation. Pancreas: Unremarkable. No pancreatic ductal dilatation or surrounding inflammatory changes. Spleen: Normal in size without focal abnormality. Adrenals/Urinary Tract:  Adrenal glands are unremarkable. Kidneys are normal, without renal calculi, focal lesion, or hydronephrosis. Bladder is unremarkable. Stomach/Bowel: Stomach is within normal limits. Appendix appears normal. No evidence of bowel wall thickening, distention, or inflammatory changes. Vascular/Lymphatic: Aortic atherosclerosis. No enlarged abdominal or pelvic lymph nodes. Reproductive: Status post  hysterectomy. No adnexal masses. Other: No abdominal wall hernia or abnormality. No abdominopelvic ascites. Musculoskeletal: No acute or significant osseous findings. IMPRESSION: No acute abnormality seen in the abdomen or pelvis. Aortic Atherosclerosis (ICD10-I70.0). Electronically Signed   By: Marijo Conception M.D.   On: 12/08/2022 13:33   VAS [place] ABI WITH/WO TBI  Result Date: 12/05/2022  LOWER EXTREMITY DOPPLER STUDY Patient Name:  CHONG CAMILLERI  Date of Exam:   12/05/2022 Medical Rec #: [id number]      Accession #:    [id number] Date of Birth: 06-24-1956     Patient Gender: F Patient Age:   43 years Exam Location:  [facility] Procedure:      VAS [place] ABI WITH/WO TBI Referring Phys: --------------------------------------------------------------------------------  Indications: Ulceration, and peripheral artery disease. Ulceration of the toes              of the left foot. Patient reports ulcers have been present              approximately 10 days. High Risk Factors: Current smoker.  Vascular Interventions: 06/01/2021                         Right great toe and second toe amputation.                         05/19/2021 Right popliteal angioplasty and stenting. Performing Technologist: Ronal Fear RVS, RCS  Examination Guidelines: A complete evaluation includes at minimum, Doppler waveform signals and systolic blood pressure reading at the level of bilateral brachial, anterior tibial, and posterior tibial arteries, when vessel segments are accessible. Bilateral testing is considered an integral part of a complete examination. Photoelectric Plethysmograph (PPG) waveforms and toe systolic pressure readings are included as required and additional duplex testing as needed. Limited examinations for reoccurring indications may be performed as noted.  ABI Findings: +---------+------------------+-----+-------------------+----------+ Right    Rt Pressure (mmHg)IndexWaveform           Comment     +---------+------------------+-----+-------------------+----------+ PTA      33                0.22 dampened monophasic           +---------+------------------+-----+-------------------+----------+ DP       30                0.20 dampened monophasic           +---------+------------------+-----+-------------------+----------+ Great Toe                                          amputation +---------+------------------+-----+-------------------+----------+ +---------+------------------+-----+--------+-------+ Left     Lt Pressure (mmHg)IndexWaveformComment +---------+------------------+-----+--------+-------+ Brachial 151                                    +---------+------------------+-----+--------+-------+ PTA  absent          +---------+------------------+-----+--------+-------+ DP                              absent          +---------+------------------+-----+--------+-------+ Great Toe                       Absent          +---------+------------------+-----+--------+-------+ +-------+-----------+-----------+------------+------------+ ABI/TBIToday's ABIToday's TBIPrevious ABIPrevious TBI +-------+-----------+-----------+------------+------------+ Right  0.22       amputation 0.90        amputation   +-------+-----------+-----------+------------+------------+ Left   absent     absent     0.97                     +-------+-----------+-----------+------------+------------+  Bilateral ABIs appear decreased compared to prior study on 09/06/2021.  Summary: Right: Resting right ankle-brachial index indicates critical limb ischemia. Left: No Doppler flow detected in the posterior tibial or dorsalis pedis artery.  *See table(s) above for measurements and observations.  Electronically signed by Harold Barban MD on 12/05/2022 at 10:16:39 PM.    Final       Labs:  Recent Labs    12/08/22 1020 12/08/22 1029  WBC  --  15.2*   HGB 5.8* 4.4*  HCT 17.0* 16.5*  PLT  --  824*   Recent Labs    12/08/22 1020 12/08/22 1035  NA 133* 135  K 3.3* 3.3*  CL 95* 96*  CO2  --  23  GLUCOSE 190* 191*  BUN 7* 7*  CREATININE 0.60 0.84  CALCIUM  --  8.6*   No results for input(s): "PROT", "ALBUMIN", "AST", "ALT", "ALKPHOS", "BILITOT", "BILIDIR", "IBILI" in the last 72 hours. No results for input(s): "HEPBSAG", "HCVAB", "HEPAIGM", "HEPBIGM" in the last 72 hours. Recent Labs    12/08/22 1125  LABPROT 15.2  INR 1.2    Past Medical History:  Diagnosis Date   Anemia 04/30/2016   Anxiety    Arthritis    Cardiomyopathy ([place])    a. EF 40-45% by echo in 04/2016 b. Improved to 60-65% by repeat imaging in 2018   COPD (chronic obstructive pulmonary disease) (HCC)    Coronary artery calcification seen on CT scan    Essential hypertension    GERD (gastroesophageal reflux disease)    Headache    History of bronchitis    History of GI bleed    History of hiatal hernia    Hyperlipidemia    Iron deficiency anemia    Peripheral vascular disease (HCC)    Pollen allergy    PSVT (paroxysmal supraventricular tachycardia)    PVC's (premature ventricular contractions)    Type 2 diabetes mellitus ([place])    Vitamin B12 deficiency     Past Surgical History:  Procedure Laterality Date   ABDOMINAL AORTOGRAM W/LOWER EXTREMITY Bilateral 05/19/2021   Procedure: ABDOMINAL AORTOGRAM W/LOWER EXTREMITY;  Surgeon: [name], MD;  Location: [facility];  Service: Cardiovascular;  Laterality: Bilateral;   ABDOMINAL HYSTERECTOMY     polyps  about age 73   AIKEN OSTEOTOMY Right 07/10/2013   Procedure: Barbie Banner OSTEOTOMY RIGHT FOOT;  Surgeon: Marcheta Grammes, DPM;  Location: [facility];  Service: Orthopedics;  Laterality: Right;   AMPUTATION Left 05/09/2017   Procedure: AMPUTATION 5TH TOE LEFT FOOT;  Surgeon: [name], DPM;  Location: [facility];  Service: Podiatry;  Laterality: Left;   AMPUTATION Left 06/13/2017    Procedure: AMPUTATION 2ND TOE LEFT FOOT;  Surgeon: [name], DPM;  Location: [facility];  Service: Podiatry;  Laterality: Left;   AMPUTATION Right 06/01/2021   Procedure: Right great toe and Second Toe Amputation;  Surgeon: [name], MD;  Location: [facility];  Service: Vascular;  Laterality: Right;   BIOPSY  10/27/2022   Procedure: BIOPSY;  Surgeon: Daneil Dolin, MD;  [facility];  Service: Endoscopy;;   BUNIONECTOMY Right 07/10/2013   Procedure: Maryfrances Bunnell RIGHT FOOT;  Surgeon: Marcheta Grammes, DPM;  Location: [facility];  Service: Orthopedics;  Laterality: Right;   CHOLECYSTECTOMY N/A 08/22/2017   Procedure: LAPAROSCOPIC CHOLECYSTECTOMY;  Surgeon: Virl Cagey, MD;  Location: [facility];  Service: General;  Laterality: N/A;   COLONOSCOPY N/A 07/07/2016   Dr. Oneida Alar; redundant left colon, diverticulosis at hepatic flexure, non-bleeding internal hemorrhoids   ENTEROSCOPY N/A 10/27/2022   Procedure: ENTEROSCOPY;  Surgeon: Daneil Dolin, MD;  [facility];  Service: Endoscopy;  Laterality: N/A;   ESOPHAGOGASTRODUODENOSCOPY N/A 07/07/2016   Dr. Oneida Alar: many non-bleeding cratered gastric ulcers without stigmata of bleeding in gastric antrum. four 2-3 mm angioectasias without bleeding in duodenal bulb and second portion of duodenum s/p APC. Chroni gastritis on path.    ESOPHAGOGASTRODUODENOSCOPY N/A 08/03/2017   Dr. Oneida Alar: erosive gastritis, AVMs. Found a single non-bleeding angioectasia in stomach, s/p APC therapy. Four non-bleeding angioectasias in duodenum s/p APC. Non-bleeding erosive gastropathy   HOT HEMOSTASIS  10/27/2022   Procedure: HOT HEMOSTASIS (ARGON PLASMA COAGULATION/BICAP);  Surgeon: Daneil Dolin, MD;  [facility];  Service: Endoscopy;;   IR ANGIOGRAM EXTREMITY LEFT  04/24/2017   IR FEM POP ART ATHERECT INC PTA MOD SED  04/24/2017   IR INFUSION THROMBOL ARTERIAL INITIAL (MS)  04/24/2017   IR RADIOLOGIST EVAL & MGMT  12/05/2016    IR US GUIDE VASC ACCESS RIGHT  04/24/2017   LIVER BIOPSY N/A 08/22/2017   Procedure: LIVER BIOPSY;  Surgeon: Virl Cagey, MD;  Location: [facility];  Service: General;  Laterality: N/A;   METATARSAL HEAD EXCISION Right 07/10/2013   Procedure: METATARSAL HEAD RESECTION OF DIGITS 2 AND 3 RIGHT FOOT;  Surgeon: Marcheta Grammes, DPM;  Location: [facility];  Service: Orthopedics;  Laterality: Right;   PERIPHERAL VASCULAR INTERVENTION Right 05/19/2021   Procedure: PERIPHERAL VASCULAR INTERVENTION;  Surgeon: [name], MD;  Location: [facility];  Service: Cardiovascular;  Laterality: Right;   PROXIMAL INTERPHALANGEAL FUSION (PIP) Right 07/10/2013   Procedure: ARTHRODESIS PIPJ  2ND DIGIT RIGHT FOOT;  Surgeon: Marcheta Grammes, DPM;  Location[facility];  Service: Orthopedics;  Laterality: Right;    Family History  Adopted: Yes  Problem Relation Age of Onset   Hypertension Father    Coronary artery disease Sister    Ulcers Maternal Grandmother    Colon cancer Neg Hx        unknown, was adopted    Prior to Admission medications   Medication Sig Start Date End Date Taking? Authorizing Provider  aspirin 81 MG chewable tablet Chew 1 tablet (81 mg total) by mouth every morning. 10/30/22  Yes Manuella Ghazi, Pratik D, DO  clopidogrel (PLAVIX) 75 MG tablet Take 1 tablet (75 mg total) by mouth daily. TAKE ONE TABLET ('75MG'$  TOTAL) BY MOUTH DAILY WITH BREAKFAST 10/30/22  Yes Manuella Ghazi, Pratik D, DO  acetaminophen (TYLENOL) 500 MG tablet Take 500 mg by mouth at bedtime as  needed for moderate pain.    [provider]  albuterol (PROVENTIL) (2.5 MG/3ML) 0.083% nebulizer solution Take 3 mLs (2.5 mg total) by nebulization every 4 (four) hours as needed for wheezing or shortness of breath. 04/26/22   [name], NP  albuterol (VENTOLIN HFA) 108 (90 Base) MCG/ACT inhaler INHALE TWO PUFFS INTO THE LUNGS EVERY SIX HOURS AS NEEDED FOR WHEEZING OR SHORTNESS OF BREATH 04/26/22   [name], NP   ALPRAZolam Duanne Moron) 0.5 MG tablet TAKE ONE TABLET (0.'5MG'$  TOTAL) BY MOUTH DAILY AS NEEDED FOR ANXIETY 04/25/22   [name], NP  atorvastatin (LIPITOR) 80 MG tablet Take 1 tablet (80 mg total) by mouth daily. 06/07/22   Satira Sark, MD  blood glucose meter kit and supplies Dispense based on patient and insurance preference. Use up to four times daily as directed. (FOR ICD-10 E10.9, E11.9). 11/22/20   [name], NP  Budeson-Glycopyrrol-Formoterol (BREZTRI AEROSPHERE) 160-9-4.8 MCG/ACT AERO Inhale 2 puffs into the lungs 2 (two) times daily. 11/15/20   Garner Nash, DO  Cholecalciferol (VITAMIN D) 50 MCG (2000 UT) CAPS Take 1 capsule (2,000 Units total) by mouth daily. 03/15/22   Derek Jack, MD  Continuous Blood Gluc Receiver (FREESTYLE LIBRE 2 READER) DEVI Check blood glucose up to 4 times daily 10/11/22   Inda Coke, PA  Continuous Blood Gluc Sensor (FREESTYLE LIBRE 2 SENSOR) MISC Apply one sensor to the upper arm avery 14 days. 04/26/22   [name], NP  cyanocobalamin (VITAMIN B12) 1000 MCG/ML injection INJECT 1 ML INTO THE MUSCLE EVERY 30 DAYS. Patient taking differently: Inject 1,000 mcg into the skin every 30 (thirty) days. 08/09/22   Derek Jack, MD  cyclobenzaprine (FLEXERIL) 5 MG tablet Take 1 tablet (5 mg total) by mouth 3 (three) times daily as needed for muscle spasms. Patient taking differently: Take 2.5-5 mg by mouth 3 (three) times daily as needed for muscle spasms. 04/25/22   [name], NP  FLUoxetine (PROZAC) 40 MG capsule Take 1 capsule (40 mg total) by mouth daily. 04/26/22   [name], NP  gabapentin (NEURONTIN) 300 MG capsule TAKE ONE CAPSULE (300 MG TOTAL) BY MOUTHTWO TIMES DAILY. Patient taking differently: Take 300 mg by mouth 3 (three) times daily. TAKE ONE CAPSULE (300 MG TOTAL) BY MOUTHTWO TIMES DAILY. 04/26/22   [name], NP  glucose blood [facility] ULTRA) test strip Use as instructed 02/26/19   Letta Median K, DO   glucose monitoring kit (FREESTYLE) monitoring kit Use to check blood sugar BID as directed 01/24/19   Cirigliano, Stanton Kidney K, DO  Lancets (ONETOUCH DELICA PLUS 123XX123) MISC 1 each by Does not apply route 2 (two) times a day. 02/26/19   Cirigliano, Garvin Fila, DO  metFORMIN (GLUCOPHAGE-XR) 500 MG 24 hr tablet Take 1 tablet (500 mg total) by mouth daily with breakfast. 10/25/22   Inda Coke, PA  Misc. Devices MISC Please provide supplies (needle, syringe, alcohol swabs) needed for patient to self-administer B-12 injections monthly. 01/07/20   Derek Jack, MD  pantoprazole (PROTONIX) 40 MG tablet TAKE ONE (1) TABLET 30 MINS BEFORE YOUR FIRST MEAL. 04/26/22   [name], NP  sacubitril-valsartan (ENTRESTO) 49-51 MG Take 1 tablet by mouth 2 (two) times daily. Patient taking differently: Take 1 tablet by mouth daily. 06/07/22   Satira Sark, MD  traMADol (ULTRAM) 50 MG tablet Take 1 tablet (50 mg total) by mouth every 12 (twelve) hours as needed. 11/29/22   Inda Coke, PA  Tuberculin-Allergy Syringes Flossie Buffy TUBERCULIN SAFETY SYR)  25G X 1" 1 ML MISC USE TO ADMINISTER INJECTABLE VITAMIN B12. 08/09/22   Derek Jack, MD    Current Facility-Administered Medications  Medication Dose Route Frequency Provider Last Rate Last Admin   0.9 %  sodium chloride infusion (Manually program via Guardrails IV Fluids)   Intravenous Once Audley Hose, MD       0.9 %  sodium chloride infusion   Intravenous Continuous Norins, Heinz Knuckles, MD       acetaminophen (TYLENOL) tablet 500 mg  500 mg Oral QHS PRN Norins, Heinz Knuckles, MD       albuterol (PROVENTIL) (2.5 MG/3ML) 0.083% nebulizer solution 2.5 mg  2.5 mg Nebulization Q4H PRN Norins, Heinz Knuckles, MD       ALPRAZolam Duanne Moron) tablet 0.5 mg  0.5 mg Oral Daily PRN Norins, Heinz Knuckles, MD       atorvastatin (LIPITOR) tablet 80 mg  80 mg Oral Daily Norins, Heinz Knuckles, MD       Budeson-Glycopyrrol-Formoterol 160-9-4.8 MCG/ACT AERO 2 puff  2 puff  Inhalation BID Norins, Heinz Knuckles, MD       cyclobenzaprine (FLEXERIL) tablet 2.5-5 mg  2.5-5 mg Oral TID PRN Norins, Heinz Knuckles, MD       FLUoxetine (PROZAC) capsule 40 mg  40 mg Oral Daily Norins, Heinz Knuckles, MD       gabapentin (NEURONTIN) capsule 300 mg  300 mg Oral TID Norins, Heinz Knuckles, MD       HYDROmorphone (DILAUDID) injection 0.5-1 mg  0.5-1 mg Intravenous Q2H PRN Norins, Heinz Knuckles, MD       [START ON 12/09/2022] metFORMIN (GLUCOPHAGE-XR) 24 hr tablet 500 mg  500 mg Oral Q breakfast Norins, Heinz Knuckles, MD       pantoprazole (PROTONIX) EC tablet 40 mg  40 mg Oral Daily Norins, Heinz Knuckles, MD       sacubitril-valsartan (ENTRESTO) 49-51 mg per tablet  1 tablet Oral Daily Norins, Heinz Knuckles, MD       traMADol Veatrice Bourbon) tablet 50 mg  50 mg Oral Q8H PRN Norins, Heinz Knuckles, MD       zolpidem (AMBIEN) tablet 5 mg  5 mg Oral QHS PRN Norins, Heinz Knuckles, MD       Current Outpatient Medications  Medication Sig Dispense Refill   aspirin 81 MG chewable tablet Chew 1 tablet (81 mg total) by mouth every morning. 30 tablet 0   clopidogrel (PLAVIX) 75 MG tablet Take 1 tablet (75 mg total) by mouth daily. TAKE ONE TABLET ('75MG'$  TOTAL) BY MOUTH DAILY WITH BREAKFAST 90 tablet 1   acetaminophen (TYLENOL) 500 MG tablet Take 500 mg by mouth at bedtime as needed for moderate pain.     albuterol (PROVENTIL) (2.5 MG/3ML) 0.083% nebulizer solution Take 3 mLs (2.5 mg total) by nebulization every 4 (four) hours as needed for wheezing or shortness of breath. 75 mL 5   albuterol (VENTOLIN HFA) 108 (90 Base) MCG/ACT inhaler INHALE TWO PUFFS INTO THE LUNGS EVERY SIX HOURS AS NEEDED FOR WHEEZING OR SHORTNESS OF BREATH 8.5 g 5   ALPRAZolam (XANAX) 0.5 MG tablet TAKE ONE TABLET (0.'5MG'$  TOTAL) BY MOUTH DAILY AS NEEDED FOR ANXIETY 30 tablet 2   atorvastatin (LIPITOR) 80 MG tablet Take 1 tablet (80 mg total) by mouth daily. 90 tablet 3   blood glucose meter kit and supplies Dispense based on patient and insurance preference. Use up to  four times daily as directed. (FOR ICD-10 E10.9, E11.9). 1 each 0   Budeson-Glycopyrrol-Formoterol (BREZTRI AEROSPHERE)  160-9-4.8 MCG/ACT AERO Inhale 2 puffs into the lungs 2 (two) times daily. 11.8 g 12   Cholecalciferol (VITAMIN D) 50 MCG (2000 UT) CAPS Take 1 capsule (2,000 Units total) by mouth daily. 90 capsule 4   Continuous Blood Gluc Receiver (FREESTYLE LIBRE 2 READER) DEVI Check blood glucose up to 4 times daily 1 each 0   Continuous Blood Gluc Sensor (FREESTYLE LIBRE 2 SENSOR) MISC Apply one sensor to the upper arm avery 14 days. 6 each 1   cyanocobalamin (VITAMIN B12) 1000 MCG/ML injection INJECT 1 ML INTO THE MUSCLE EVERY 30 DAYS. (Patient taking differently: Inject 1,000 mcg into the skin every 30 (thirty) days.) 1 mL 11   cyclobenzaprine (FLEXERIL) 5 MG tablet Take 1 tablet (5 mg total) by mouth 3 (three) times daily as needed for muscle spasms. (Patient taking differently: Take 2.5-5 mg by mouth 3 (three) times daily as needed for muscle spasms.) 45 tablet 3   FLUoxetine (PROZAC) 40 MG capsule Take 1 capsule (40 mg total) by mouth daily. 90 capsule 3   gabapentin (NEURONTIN) 300 MG capsule TAKE ONE CAPSULE (300 MG TOTAL) BY MOUTHTWO TIMES DAILY. (Patient taking differently: Take 300 mg by mouth 3 (three) times daily. TAKE ONE CAPSULE (300 MG TOTAL) BY MOUTHTWO TIMES DAILY.) 180 capsule 1   glucose blood (ONETOUCH ULTRA) test strip Use as instructed 100 each 12   glucose monitoring kit (FREESTYLE) monitoring kit Use to check blood sugar BID as directed 1 each 0   Lancets (ONETOUCH DELICA PLUS 123XX123) MISC 1 each by Does not apply route 2 (two) times a day. 100 each 3   metFORMIN (GLUCOPHAGE-XR) 500 MG 24 hr tablet Take 1 tablet (500 mg total) by mouth daily with breakfast. 90 tablet 1   Misc. Devices MISC Please provide supplies (needle, syringe, alcohol swabs) needed for patient to self-administer B-12 injections monthly. 1 each 12   pantoprazole (PROTONIX) 40 MG tablet TAKE ONE (1)  TABLET 30 MINS BEFORE YOUR FIRST MEAL. 90 tablet 3   sacubitril-valsartan (ENTRESTO) 49-51 MG Take 1 tablet by mouth 2 (two) times daily. (Patient taking differently: Take 1 tablet by mouth daily.) 60 tablet 6   traMADol (ULTRAM) 50 MG tablet Take 1 tablet (50 mg total) by mouth every 12 (twelve) hours as needed. 30 tablet 0   Tuberculin-Allergy Syringes ([employer] TUBERCULIN SAFETY SYR) 25G X 1" 1 ML MISC USE TO ADMINISTER INJECTABLE VITAMIN B12. 1 each 11    Allergies as of 12/05/2022 - Review Complete 12/05/2022  Allergen Reaction Noted   Bee venom Swelling 05/19/2016   Codeine Anaphylaxis 04/30/2016   Penicillins Swelling and Rash 03/07/2012   Bupropion Other (See Comments) 10/04/2017   Sudafed [pseudoephedrine hcl] Rash 06/27/2013    Social History   Socioeconomic History   Marital status: Married    Spouse name: [name]   Number of children: 1   Years of education: 12   Highest education level: Not on file  Occupational History   Occupation: disabled    Comment: heart  Tobacco Use   Smoking status: Light Smoker    Packs/day: 0.50    Years: 44.00    Total pack years: 22.00    Types: Cigarettes    Start date: 05/10/1975    Passive exposure: Never   Smokeless tobacco: Never   Tobacco comments:    2 cigarettes per day 12/10/2019  Vaping Use   Vaping Use: Former  Substance and Sexual Activity   Alcohol use: Not Currently  Alcohol/week: 2.0 standard drinks of alcohol    Types: 2 Glasses of wine per week   Drug use: No   Sexual activity: Yes    Birth control/protection: Surgical  Other Topics Concern   Not on file  Social History Narrative   Disabled   Lives with husband [name]   Two dogs   Social Determinants of Health   Financial Resource Strain: Not on file  Food Insecurity: No Food Insecurity (10/30/2022)   Hunger Vital Sign    Worried About Running Out of Food in the Last Year: Never true    Ran Out of Food in the Last Year: Never true  Transportation Needs: No  Transportation Needs (10/30/2022)   PRAPARE - Hydrologist (Medical): No    Lack of Transportation (Non-Medical): No  Physical Activity: Not on file  Stress: Not on file  Social Connections: Not on file  Intimate Partner Violence: Not At Risk (10/05/2020)   Humiliation, Afraid, Rape, and Kick questionnaire    Fear of Current or Ex-Partner: No    Emotionally Abused: No    Physically Abused: No    Sexually Abused: No    Review of Systems: All systems reviewed and negative except where noted in HPI.  Physical Exam: Vital signs in last 24 hours: Temp:  [97.6 F (36.4 C)-98.5 F (36.9 C)] 98 F (36.7 C) (03/08 1540) Pulse Rate:  [80-96] 91 (03/08 1600) Resp:  [12-25] 22 (03/08 1600) BP: (135-156)/(42-56) 144/49 (03/08 1600) SpO2:  [93 %-100 %] 98 % (03/08 1600) Weight:  [60.3 kg] 60.3 kg (03/08 1111)    General:  Alert thin female in NAD Psych:  Pleasant, cooperative. Normal mood and affect Eyes: Pupils equal Ears:  Normal auditory acuity Nose: No deformity, discharge or lesions Neck:  Supple, no masses felt Lungs:  Clear to auscultation.  Heart:  Regular rate, regular rhythm.  Abdomen:  Soft, nondistended, nontender, active bowel sounds, no masses felt Rectal :  Deferred Msk: Symmetrical without gross deformities.  Neurologic:  Alert, oriented, grossly normal neurologically Extremities : No edema Skin:  Intact without significant lesions.    Intake/Output from previous day: No intake/output data recorded. Intake/Output this shift:  Total I/O In: 138.3 [I.V.:138.3] Out: -     Principal Problem:   Severe anemia Active Problems:   Acute combined systolic and diastolic heart failure (HCC)   Chronic obstructive pulmonary disease ([place])   Essential hypertension   Tobacco abuse   Type 2 diabetes mellitus ([place])   Lower limb ischemia    Tye Savoy, NP-C @  12/08/2022, 4:07 PM

## 2022-12-08 NOTE — ED Notes (Signed)
Pt placed on 2l [place], pt is usually on O2 at home (3l)

## 2022-12-08 NOTE — Assessment & Plan Note (Signed)
Patient strongly advised to NOT smoke at all. Smokes less than 10 cigarettes a day- no indication for nicotine replacement

## 2022-12-08 NOTE — ED Notes (Signed)
ED TO INPATIENT HANDOFF REPORT  ED Nurse Name and Phone #: Theresia Lo [phone]  S Name/Age/Gender Tammy Mcpherson 67 y.o. female Room/Bed: 004C/004C  Code Status   Code Status: DNR  Home/SNF/Other Home Patient oriented to: self, place, time, and situation Is this baseline? Yes   Triage Complete: Triage complete  Chief Complaint Severe anemia [D64.9]  Triage Note Pt arrived from preop for angiogram. Unable to perform angiogram d/t low hbg. Pt is A&Ox4. Pt is pale.    Allergies Allergies  Allergen Reactions   Bee Venom Anaphylaxis and Swelling   Codeine Anaphylaxis and Other (See Comments)    Tongue swelled   Penicillins Anaphylaxis, Swelling and Rash    Has patient had a PCN reaction causing immediate rash, facial/tongue/throat swelling, SOB or lightheadedness with hypotension: No, delayed Has patient had a PCN reaction causing severe rash involving mucus membranes or skin necrosis: No Has patient had a PCN reaction that required hospitalization No Has patient had a PCN reaction occurring within the last 10 years: No If all of the above answers are "NO", then may proceed with Cephalosporin use.    Bupropion Other (See Comments)    "Made my skin crawl"   Sudafed [Pseudoephedrine Hcl] Rash    Level of Care/Admitting Diagnosis ED Disposition     ED Disposition  Admit   Condition  --   [place]: [place] [[id number]]  Level of Care: Med-Surg [[id number]]  May place patient in observation at [facility] or [place] if equivalent level of care is available:: Yes  Covid Evaluation: Asymptomatic - no recent exposure (last 10 days) testing not required  Diagnosis: Severe anemia [id number]  Admitting Physician: Neena Rhymes [[id number]]  Attending Physician: Neena Rhymes [[id number]]          B Medical/Surgery History Past Medical History:  Diagnosis Date   Anemia 04/30/2016   Anxiety    Arthritis    Cardiomyopathy ([place])    a. EF 40-45% by  echo in 04/2016 b. Improved to 60-65% by repeat imaging in 2018   COPD (chronic obstructive pulmonary disease) (HCC)    Coronary artery calcification seen on CT scan    Essential hypertension    GERD (gastroesophageal reflux disease)    Headache    History of bronchitis    History of GI bleed    History of hiatal hernia    Hyperlipidemia    Iron deficiency anemia    Peripheral vascular disease (HCC)    Pollen allergy    PSVT (paroxysmal supraventricular tachycardia)    PVC's (premature ventricular contractions)    Type 2 diabetes mellitus ([place])    Vitamin B12 deficiency    Past Surgical History:  Procedure Laterality Date   ABDOMINAL AORTOGRAM W/LOWER EXTREMITY Bilateral 05/19/2021   Procedure: ABDOMINAL AORTOGRAM W/LOWER EXTREMITY;  Surgeon: [name], MD;  Location: [facility];  Service: Cardiovascular;  Laterality: Bilateral;   ABDOMINAL HYSTERECTOMY     polyps  about age 47   AIKEN OSTEOTOMY Right 07/10/2013   Procedure: Barbie Banner OSTEOTOMY RIGHT FOOT;  Surgeon: Marcheta Grammes, DPM;  Location: [facility];  Service: Orthopedics;  Laterality: Right;   AMPUTATION Left 05/09/2017   Procedure: AMPUTATION 5TH TOE LEFT FOOT;  Surgeon: [name], DPM;  Location: [facility];  Service: Podiatry;  Laterality: Left;   AMPUTATION Left 06/13/2017   Procedure: AMPUTATION 2ND TOE LEFT FOOT;  Surgeon: [name], DPM;  Location: [facility];  Service: Podiatry;  Laterality: Left;   AMPUTATION Right 06/01/2021   Procedure: Right great toe and Second Toe Amputation;  Surgeon: [name], MD;  Location: [facility];  Service: Vascular;  Laterality: Right;   BIOPSY  10/27/2022   Procedure: BIOPSY;  Surgeon: Daneil Dolin, MD;  [facility];  Service: Endoscopy;;   BUNIONECTOMY Right 07/10/2013   Procedure: Maryfrances Bunnell RIGHT FOOT;  Surgeon: Marcheta Grammes, DPM;  Location: [facility];  Service: Orthopedics;  Laterality: Right;   CHOLECYSTECTOMY N/A 08/22/2017    Procedure: LAPAROSCOPIC CHOLECYSTECTOMY;  Surgeon: Virl Cagey, MD;  Location: [facility];  Service: General;  Laterality: N/A;   COLONOSCOPY N/A 07/07/2016   Dr. Oneida Alar; redundant left colon, diverticulosis at hepatic flexure, non-bleeding internal hemorrhoids   ENTEROSCOPY N/A 10/27/2022   Procedure: ENTEROSCOPY;  Surgeon: Daneil Dolin, MD;  [facility];  Service: Endoscopy;  Laterality: N/A;   ESOPHAGOGASTRODUODENOSCOPY N/A 07/07/2016   Dr. Oneida Alar: many non-bleeding cratered gastric ulcers without stigmata of bleeding in gastric antrum. four 2-3 mm angioectasias without bleeding in duodenal bulb and second portion of duodenum s/p APC. Chroni gastritis on path.    ESOPHAGOGASTRODUODENOSCOPY N/A 08/03/2017   Dr. Oneida Alar: erosive gastritis, AVMs. Found a single non-bleeding angioectasia in stomach, s/p APC therapy. Four non-bleeding angioectasias in duodenum s/p APC. Non-bleeding erosive gastropathy   HOT HEMOSTASIS  10/27/2022   Procedure: HOT HEMOSTASIS (ARGON PLASMA COAGULATION/BICAP);  Surgeon: Daneil Dolin, MD;  [facility];  Service: Endoscopy;;   IR ANGIOGRAM EXTREMITY LEFT  04/24/2017   IR FEM POP ART ATHERECT INC PTA MOD SED  04/24/2017   IR INFUSION THROMBOL ARTERIAL INITIAL (MS)  04/24/2017   IR RADIOLOGIST EVAL & MGMT  12/05/2016   IR US GUIDE VASC ACCESS RIGHT  04/24/2017   LIVER BIOPSY N/A 08/22/2017   Procedure: LIVER BIOPSY;  Surgeon: Virl Cagey, MD;  Location[facility];  Service: General;  Laterality: N/A;   METATARSAL HEAD EXCISION Right 07/10/2013   Procedure: METATARSAL HEAD RESECTION OF DIGITS 2 AND 3 RIGHT FOOT;  Surgeon: Marcheta Grammes, DPM;  Location: [facility];  Service: Orthopedics;  Laterality: Right;   PERIPHERAL VASCULAR INTERVENTION Right 05/19/2021   Procedure: PERIPHERAL VASCULAR INTERVENTION;  Surgeon: [name], MD;  Location: [facility];  Service: Cardiovascular;  Laterality: Right;   PROXIMAL INTERPHALANGEAL  FUSION (PIP) Right 07/10/2013   Procedure: ARTHRODESIS PIPJ  2ND DIGIT RIGHT FOOT;  Surgeon: Marcheta Grammes, DPM;  Location: [facility];  Service: Orthopedics;  Laterality: Right;     A IV Location/Drains/Wounds Patient Lines/Drains/Airways Status     Active Line/Drains/Airways     Name Placement date Placement time Site Days   Peripheral IV 12/08/22 22 G 1" Right Antecubital 12/08/22  1015  Antecubital  less than 1   Peripheral IV 12/08/22 22 G Left;Posterior Hand 12/08/22  1118  Hand  less than 1   Peripheral IV 12/08/22 20 G 1" Left Antecubital 12/08/22  1258  Antecubital  less than 1            Intake/Output Last 24 hours  Intake/Output Summary (Last 24 hours) at 12/08/2022 1904 Last data filed at 12/08/2022 1712 Gross per 24 hour  Intake 453.5 ml  Output --  Net 453.5 ml    Labs/Imaging Results for orders placed or performed during the hospital encounter of 12/08/22 (from the past 48 hour(s))  I-STAT, chem 8     Status: Abnormal   Collection Time:  12/08/22 10:20 AM  Result Value Ref Range   Sodium 133 (L) 135 - 145 mmol/L   Potassium 3.3 (L) 3.5 - 5.1 mmol/L   Chloride 95 (L) 98 - 111 mmol/L   BUN 7 (L) 8 - 23 mg/dL   Creatinine, Ser 0.60 0.44 - 1.00 mg/dL   Glucose, Bld 190 (H) 70 - 99 mg/dL    Comment: Glucose reference range applies only to samples taken after fasting for at least 8 hours.   Calcium, Ion 1.03 (L) 1.15 - 1.40 mmol/L   TCO2 25 22 - 32 mmol/L   Hemoglobin 5.8 (LL) 12.0 - 15.0 g/dL   HCT 17.0 (L) 36.0 - 46.0 %   Comment NOTIFIED PHYSICIAN   CBC     Status: Abnormal   Collection Time: 12/08/22 10:29 AM  Result Value Ref Range   WBC 15.2 (H) 4.0 - 10.5 K/uL   RBC 2.21 (L) 3.87 - 5.11 MIL/uL   Hemoglobin 4.4 (LL) 12.0 - 15.0 g/dL    Comment: REPEATED TO VERIFY Reticulocyte Hemoglobin testing may be clinically indicated, consider ordering this additional test [id number] THIS CRITICAL RESULT HAS VERIFIED AND BEEN CALLED TO CASSANDRA Jhane Lorio BY  SHERRY GALLOWAY ON 03 08 2024 AT 44, AND HAS BEEN READ BACK.     HCT 16.5 (L) 36.0 - 46.0 %   MCV 74.7 (L) 80.0 - 100.0 fL   MCH 19.9 (L) 26.0 - 34.0 pg   MCHC 26.7 (L) 30.0 - 36.0 g/dL   RDW 19.9 (H) 11.5 - 15.5 %   Platelets 824 (H) 150 - 400 K/uL   nRBC 0.4 (H) 0.0 - 0.2 %    Comment: Performed at [facility] [address], [place], [place] [id number]  Basic metabolic panel     Status: Abnormal   Collection Time: 12/08/22 10:35 AM  Result Value Ref Range   Sodium 135 135 - 145 mmol/L   Potassium 3.3 (L) 3.5 - 5.1 mmol/L   Chloride 96 (L) 98 - 111 mmol/L   CO2 23 22 - 32 mmol/L   Glucose, Bld 191 (H) 70 - 99 mg/dL    Comment: Glucose reference range applies only to samples taken after fasting for at least 8 hours.   BUN 7 (L) 8 - 23 mg/dL   Creatinine, Ser 0.84 0.44 - 1.00 mg/dL   Calcium 8.6 (L) 8.9 - 10.3 mg/dL   GFR, Estimated >60 >60 mL/min    Comment: (NOTE) Calculated using the CKD-EPI Creatinine Equation (2021)    Anion gap 16 (H) 5 - 15    Comment: Performed at [facility] [address], [place], [place] [address]  Type and screen [place]     Status: None (Preliminary result)   Collection Time: 12/08/22 11:25 AM  Result Value Ref Range   ABO/RH(D) O POS    Antibody Screen POS    Sample Expiration 12/11/2022,2359    Antibody Identification      ANTI FYA (Duffy a) Performed at [facility], [place] [address] [place], [place] [address]    Unit Number [id number]    Blood Component Type RED CELLS,LR    Unit division 00    Status of Unit ISSUED    Transfusion Status OK TO TRANSFUSE    Crossmatch Result COMPATIBLE    Unit Number [id number]    Blood Component Type RED CELLS,LR    Unit division 00    Status of Unit ISSUED  Transfusion Status OK TO TRANSFUSE    Crossmatch Result COMPATIBLE    Unit Number [id number]    Blood Component Type RED CELLS,LR    Unit division 00    Status of Unit ALLOCATED    Donor  AG Type NEGATIVE FOR DUFFY A ANTIGEN    Transfusion Status OK TO TRANSFUSE    Crossmatch Result COMPATIBLE   Protime-INR     Status: None   Collection Time: 12/08/22 11:25 AM  Result Value Ref Range   Prothrombin Time 15.2 11.4 - 15.2 seconds   INR 1.2 0.8 - 1.2    Comment: (NOTE) INR goal varies based on device and disease states. Performed at [facility], [place] [address] [place], [place] [address]   APTT     Status: None   Collection Time: 12/08/22 11:25 AM  Result Value Ref Range   aPTT 34 24 - 36 seconds    Comment: Performed at [facility] [address], [place], [place] [address]  POC occult blood, ED     Status: None   Collection Time: 12/08/22 11:56 AM  Result Value Ref Range   Fecal Occult Bld NEGATIVE NEGATIVE  Prepare RBC (crossmatch)     Status: None   Collection Time: 12/08/22 12:00 PM  Result Value Ref Range   Order Confirmation      ORDER PROCESSED BY BLOOD BANK Performed at [facility], [place] [address], [place], [place] [address]   Lactic acid, plasma     Status: Abnormal   Collection Time: 12/08/22 12:11 PM  Result Value Ref Range   Lactic Acid, Venous 3.8 (HH) 0.5 - 1.9 mmol/L    Comment: CRITICAL RESULT CALLED TO, READ BACK BY AND VERIFIED WITH Tillman Abide, RN 1301 12/08/22 L. KLAR Performed at [facility], [place] [address] [place], [place] [address]   Vitamin B12     Status: None   Collection Time: 12/08/22  3:23 PM  Result Value Ref Range   Vitamin B-12 588 180 - 914 pg/mL    Comment: (NOTE) This assay is not validated for testing neonatal or myeloproliferative syndrome specimens for Vitamin B12 levels. Performed at [facility], [place] [address] [place], [place] [address]   Reticulocytes     Status: Abnormal   Collection Time: 12/08/22  3:23 PM  Result Value Ref Range   Retic Ct Pct 1.7 0.4 - 3.1 %   RBC. 2.03 (L) 3.87 - 5.11 MIL/uL   Retic Count, Absolute 33.7 19.0 - 186.0 K/uL   Immature Retic Fract 39.4 (H)  2.3 - 15.9 %    Comment: Performed at [facility] [address] [place], [place] [address]  Folate     Status: None   Collection Time: 12/08/22  3:23 PM  Result Value Ref Range   Folate 8.2 >5.9 ng/mL    Comment: Performed at [facility], [place] [address], [place], [place] [address]  Iron and TIBC     Status: Abnormal   Collection Time: 12/08/22  3:25 PM  Result Value Ref Range   Iron 14 (L) 28 - 170 ug/dL   TIBC 402 250 - 450 ug/dL   Saturation Ratios 4 (L) 10.4 - 31.8 %   UIBC 388 ug/dL    Comment: Performed at [facility], [place] [address], [place], [place] [address]  Ferritin     Status: None   Collection Time: 12/08/22  3:25 PM  Result Value Ref Range   Ferritin 11 11 - 307 ng/mL    Comment: Performed at [facility], [place] [address], [place], [place] [address]  Hemoglobin and hematocrit, blood     Status: Abnormal   Collection Time: 12/08/22  3:25 PM  Result Value Ref Range   Hemoglobin 4.0 (LL) 12.0 - 15.0 g/dL    Comment: REPEATED TO VERIFY THIS CRITICAL RESULT HAS VERIFIED AND BEEN CALLED TO C. Ken[place]ton Mielke, RN BY SHAY EKDAHL ON 03 08 2024 AT 1700, AND HAS BEEN READ BACK.     HCT 15.4 (L) 36.0 - 46.0 %    Comment: Performed at [facility], [place] [address] [place], [place] [address]  Prepare RBC (crossmatch)     Status: None   Collection Time: 12/08/22  4:11 PM  Result Value Ref Range   Order Confirmation      ORDER PROCESSED BY BLOOD BANK Performed at [facility], [place] [address], [place], [place] [address]    CT ABDOMEN PELVIS W CONTRAST  Result Date: 12/08/2022 CLINICAL DATA:  Acute generalized abdominal pain. EXAM: CT ABDOMEN AND PELVIS WITH CONTRAST TECHNIQUE: Multidetector CT imaging of the abdomen and pelvis was performed using the standard protocol following bolus administration of intravenous contrast. RADIATION DOSE REDUCTION: This exam was performed according to the departmental dose-optimization program which includes automated  exposure control, adjustment of the mA and/or kV according to patient size and/or use of iterative reconstruction technique. CONTRAST:  35m OMNIPAQUE IOHEXOL 350 MG/ML SOLN COMPARISON:  June 17, 2020. FINDINGS: Lower chest: No acute abnormality. Hepatobiliary: No focal liver abnormality is seen. Status post cholecystectomy. No biliary dilatation. Pancreas: Unremarkable. No pancreatic ductal dilatation or surrounding inflammatory changes. Spleen: Normal in size without focal abnormality. Adrenals/Urinary Tract: Adrenal glands are unremarkable. Kidneys are normal, without renal calculi, focal lesion, or hydronephrosis. Bladder is unremarkable. Stomach/Bowel: Stomach is within normal limits. Appendix appears normal. No evidence of bowel wall thickening, distention, or inflammatory changes. Vascular/Lymphatic: Aortic atherosclerosis. No enlarged abdominal or pelvic lymph nodes. Reproductive: Status post hysterectomy. No adnexal masses. Other: No abdominal wall hernia or abnormality. No abdominopelvic ascites. Musculoskeletal: No acute or significant osseous findings. IMPRESSION: No acute abnormality seen in the abdomen or pelvis. Aortic Atherosclerosis (ICD10-I70.0). Electronically Signed   By: JMarijo ConceptionM.D.   On: 12/08/2022 13:33    Pending Labs Unresulted Labs (From admission, onward)     Start     Ordered   12/08/22 1144  Lactic acid, plasma  Now then every 2 hours,   R (with STAT occurrences)      12/08/22 1143            Vitals/Pain Today's Vitals   12/08/22 1600 12/08/22 1711 12/08/22 1727 12/08/22 1742  BP: (!) 144/49 (!) 135/57 (!) 144/56 (!) 145/63  Pulse: 91 94 88 91  Resp: (!) 22 (!) 21 (!) 23 (!) 24  Temp:  98.4 F (36.9 C) 98.4 F (36.9 C) 98 F (36.7 C)  TempSrc:  Oral Oral Oral  SpO2: 98%   98%  Weight:      Height:      PainSc: 10-Worst pain ever   8     Isolation Precautions No active isolations  Medications Medications  acetaminophen (TYLENOL) tablet  500 mg (has no administration in time range)  atorvastatin (LIPITOR) tablet 80 mg (has no administration in time range)  sacubitril-valsartan (ENTRESTO) 49-51 mg per tablet (has no administration in time range)  ALPRAZolam (  XANAX) tablet 0.5 mg (has no administration in time range)  FLUoxetine (PROZAC) capsule 40 mg (has no administration in time range)  metFORMIN (GLUCOPHAGE-XR) 24 hr tablet 500 mg (has no administration in time range)  pantoprazole (PROTONIX) EC tablet 40 mg (has no administration in time range)  cyclobenzaprine (FLEXERIL) tablet 2.5-5 mg (has no administration in time range)  gabapentin (NEURONTIN) capsule 300 mg (has no administration in time range)  albuterol (PROVENTIL) (2.5 MG/3ML) 0.083% nebulizer solution 2.5 mg (has no administration in time range)  Budeson-Glycopyrrol-Formoterol 160-9-4.8 MCG/ACT AERO 2 puff (has no administration in time range)  0.9 %  sodium chloride infusion ( Intravenous New Bag/Given 12/08/22 1843)  traMADol (ULTRAM) tablet 50 mg (50 mg Oral Given 12/08/22 1710)  HYDROmorphone (DILAUDID) injection 0.5-1 mg (has no administration in time range)  zolpidem (AMBIEN) tablet 5 mg (has no administration in time range)  0.9 %  sodium chloride infusion (Manually program via Guardrails IV Fluids) ( Intravenous New Bag/Given 12/08/22 1725)  iohexol (OMNIPAQUE) 350 MG/ML injection 75 mL (75 mLs Intravenous Contrast Given 12/08/22 1324)  0.9 %  sodium chloride infusion (Manually program via Guardrails IV Fluids) (0 mLs Intravenous Stopped 12/08/22 1711)    Mobility walks with device     Focused Assessments Low Hbg   R Recommendations: See Admitting Provider Note  Report given to:   Additional Notes:

## 2022-12-09 ENCOUNTER — Encounter (HOSPITAL_COMMUNITY)
Admission: RE | Disposition: A | Discharge status: Home Health | Payer: Self-pay | Source: Home / Self Care | Attending: Internal Medicine

## 2022-12-09 ENCOUNTER — Observation Stay (HOSPITAL_COMMUNITY): Payer: PPO | Admitting: Anesthesiology

## 2022-12-09 ENCOUNTER — Observation Stay (HOSPITAL_BASED_OUTPATIENT_CLINIC_OR_DEPARTMENT_OTHER): Payer: PPO | Admitting: Anesthesiology

## 2022-12-09 ENCOUNTER — Encounter (HOSPITAL_COMMUNITY): Payer: Self-pay | Admitting: Internal Medicine

## 2022-12-09 DIAGNOSIS — K31819 Angiodysplasia of stomach and duodenum without bleeding: Secondary | ICD-10-CM

## 2022-12-09 DIAGNOSIS — D649 Anemia, unspecified: Secondary | ICD-10-CM | POA: Diagnosis not present

## 2022-12-09 DIAGNOSIS — Z66 Do not resuscitate: Secondary | ICD-10-CM | POA: Diagnosis present

## 2022-12-09 DIAGNOSIS — K259 Gastric ulcer, unspecified as acute or chronic, without hemorrhage or perforation: Secondary | ICD-10-CM

## 2022-12-09 DIAGNOSIS — Z95820 Peripheral vascular angioplasty status with implants and grafts: Secondary | ICD-10-CM | POA: Diagnosis not present

## 2022-12-09 DIAGNOSIS — D62 Acute posthemorrhagic anemia: Secondary | ICD-10-CM | POA: Diagnosis present

## 2022-12-09 DIAGNOSIS — E11621 Type 2 diabetes mellitus with foot ulcer: Secondary | ICD-10-CM | POA: Diagnosis present

## 2022-12-09 DIAGNOSIS — I110 Hypertensive heart disease with heart failure: Secondary | ICD-10-CM

## 2022-12-09 DIAGNOSIS — Z7951 Long term (current) use of inhaled steroids: Secondary | ICD-10-CM | POA: Diagnosis not present

## 2022-12-09 DIAGNOSIS — E785 Hyperlipidemia, unspecified: Secondary | ICD-10-CM | POA: Diagnosis present

## 2022-12-09 DIAGNOSIS — Z7902 Long term (current) use of antithrombotics/antiplatelets: Secondary | ICD-10-CM | POA: Diagnosis not present

## 2022-12-09 DIAGNOSIS — Z7984 Long term (current) use of oral hypoglycemic drugs: Secondary | ICD-10-CM | POA: Diagnosis not present

## 2022-12-09 DIAGNOSIS — D509 Iron deficiency anemia, unspecified: Secondary | ICD-10-CM | POA: Diagnosis present

## 2022-12-09 DIAGNOSIS — Z7982 Long term (current) use of aspirin: Secondary | ICD-10-CM | POA: Diagnosis not present

## 2022-12-09 DIAGNOSIS — K580 Irritable bowel syndrome with diarrhea: Secondary | ICD-10-CM | POA: Diagnosis present

## 2022-12-09 DIAGNOSIS — F17210 Nicotine dependence, cigarettes, uncomplicated: Secondary | ICD-10-CM | POA: Diagnosis present

## 2022-12-09 DIAGNOSIS — K31811 Angiodysplasia of stomach and duodenum with bleeding: Secondary | ICD-10-CM | POA: Diagnosis present

## 2022-12-09 DIAGNOSIS — Z8711 Personal history of peptic ulcer disease: Secondary | ICD-10-CM | POA: Diagnosis not present

## 2022-12-09 DIAGNOSIS — I2510 Atherosclerotic heart disease of native coronary artery without angina pectoris: Secondary | ICD-10-CM

## 2022-12-09 DIAGNOSIS — K254 Chronic or unspecified gastric ulcer with hemorrhage: Secondary | ICD-10-CM | POA: Diagnosis present

## 2022-12-09 DIAGNOSIS — Z91030 Bee allergy status: Secondary | ICD-10-CM | POA: Diagnosis not present

## 2022-12-09 DIAGNOSIS — I509 Heart failure, unspecified: Secondary | ICD-10-CM

## 2022-12-09 DIAGNOSIS — Z885 Allergy status to narcotic agent status: Secondary | ICD-10-CM | POA: Diagnosis not present

## 2022-12-09 DIAGNOSIS — K5520 Angiodysplasia of colon without hemorrhage: Secondary | ICD-10-CM

## 2022-12-09 DIAGNOSIS — Z90710 Acquired absence of both cervix and uterus: Secondary | ICD-10-CM | POA: Diagnosis not present

## 2022-12-09 DIAGNOSIS — Z79899 Other long term (current) drug therapy: Secondary | ICD-10-CM | POA: Diagnosis not present

## 2022-12-09 DIAGNOSIS — K5521 Angiodysplasia of colon with hemorrhage: Secondary | ICD-10-CM | POA: Diagnosis present

## 2022-12-09 DIAGNOSIS — I739 Peripheral vascular disease, unspecified: Secondary | ICD-10-CM | POA: Diagnosis present

## 2022-12-09 DIAGNOSIS — I5041 Acute combined systolic (congestive) and diastolic (congestive) heart failure: Secondary | ICD-10-CM | POA: Diagnosis present

## 2022-12-09 DIAGNOSIS — J449 Chronic obstructive pulmonary disease, unspecified: Secondary | ICD-10-CM | POA: Diagnosis present

## 2022-12-09 DIAGNOSIS — K219 Gastro-esophageal reflux disease without esophagitis: Secondary | ICD-10-CM | POA: Diagnosis present

## 2022-12-09 HISTORY — PX: ENTEROSCOPY: SHX5533

## 2022-12-09 HISTORY — PX: SUBMUCOSAL TATTOO INJECTION: SHX6856

## 2022-12-09 HISTORY — PX: HOT HEMOSTASIS: SHX5433

## 2022-12-09 LAB — TYPE AND SCREEN
ABO/RH(D): O POS
Antibody Screen: POSITIVE
Donor AG Type: NEGATIVE
Donor AG Type: NEGATIVE
Donor AG Type: NEGATIVE
Unit division: 0
Unit division: 0
Unit division: 0

## 2022-12-09 LAB — BPAM RBC
Blood Product Expiration Date: 202403272359
Blood Product Expiration Date: 202403292359
Blood Product Expiration Date: 202403292359
ISSUE DATE / TIME: 202403081526
ISSUE DATE / TIME: 202403081715
ISSUE DATE / TIME: 202403082312
Unit Type and Rh: 5100
Unit Type and Rh: 5100
Unit Type and Rh: 5100

## 2022-12-09 LAB — COMPREHENSIVE METABOLIC PANEL
ALT: 11 U/L (ref 0–44)
AST: 18 U/L (ref 15–41)
Albumin: 2.3 g/dL — ABNORMAL LOW (ref 3.5–5.0)
Alkaline Phosphatase: 98 U/L (ref 38–126)
Anion gap: 9 (ref 5–15)
BUN: 6 mg/dL — ABNORMAL LOW (ref 8–23)
CO2: 25 mmol/L (ref 22–32)
Calcium: 8.1 mg/dL — ABNORMAL LOW (ref 8.9–10.3)
Chloride: 101 mmol/L (ref 98–111)
Creatinine, Ser: 0.62 mg/dL (ref 0.44–1.00)
GFR, Estimated: >60 mL/min (ref >60)
Glucose, Bld: 96 mg/dL (ref 70–99)
Potassium: 3.2 mmol/L — ABNORMAL LOW (ref 3.5–5.1)
Sodium: 135 mmol/L (ref 135–145)
Total Bilirubin: 1.8 mg/dL — ABNORMAL HIGH (ref 0.3–1.2)
Total Protein: 6.1 g/dL — ABNORMAL LOW (ref 6.5–8.1)

## 2022-12-09 LAB — MAGNESIUM: Magnesium: 2 mg/dL (ref 1.7–2.4)

## 2022-12-09 LAB — CBC
HCT: 28.5 % — ABNORMAL LOW (ref 36.0–46.0)
Hemoglobin: 9.4 g/dL — ABNORMAL LOW (ref 12.0–15.0)
MCH: 25.3 pg — ABNORMAL LOW (ref 26.0–34.0)
MCHC: 33 g/dL (ref 30.0–36.0)
MCV: 76.6 fL — ABNORMAL LOW (ref 80.0–100.0)
Platelets: 672 10*3/uL — ABNORMAL HIGH (ref 150–400)
RBC: 3.72 MIL/uL — ABNORMAL LOW (ref 3.87–5.11)
RDW: 18.3 % — ABNORMAL HIGH (ref 11.5–15.5)
WBC: 15 10*3/uL — ABNORMAL HIGH (ref 4.0–10.5)
nRBC: 0 % (ref 0.0–0.2)

## 2022-12-09 LAB — GLUCOSE, CAPILLARY
Glucose-Capillary: 113 mg/dL — ABNORMAL HIGH (ref 70–99)
Glucose-Capillary: 106 mg/dL — ABNORMAL HIGH (ref 70–99)

## 2022-12-09 SURGERY — ENTEROSCOPY
Anesthesia: Monitor Anesthesia Care

## 2022-12-09 MED ORDER — POTASSIUM CHLORIDE 10 MEQ/100ML IV SOLN
10.0000 meq | INTRAVENOUS | Status: AC
  Administered 2022-12-09 (×3): 10 meq via INTRAVENOUS
  Filled 2022-12-09 (×3): qty 100

## 2022-12-09 MED ORDER — POTASSIUM CHLORIDE CRYS ER 10 MEQ PO TBCR: 30.0000 meq | EXTENDED_RELEASE_TABLET | Freq: Once | ORAL | Status: DC

## 2022-12-09 MED ORDER — PHENYLEPHRINE 80 MCG/ML (10ML) SYRINGE FOR IV PUSH (FOR BLOOD PRESSURE SUPPORT)
PREFILLED_SYRINGE | INTRAVENOUS | Status: DC | PRN
  Administered 2022-12-09: 160 ug via INTRAVENOUS

## 2022-12-09 MED ORDER — PROPOFOL 10 MG/ML IV BOLUS
INTRAVENOUS | Status: DC | PRN
  Administered 2022-12-09: 20 mg via INTRAVENOUS

## 2022-12-09 MED ORDER — SODIUM CHLORIDE 0.9 % IV SOLN
250.0000 mg | Freq: Every day | INTRAVENOUS | Status: AC
  Administered 2022-12-09 – 2022-12-10 (×2): 250 mg via INTRAVENOUS
  Filled 2022-12-09 (×2): qty 20

## 2022-12-09 MED ORDER — CLOPIDOGREL BISULFATE 75 MG PO TABS
75.0000 mg | ORAL_TABLET | Freq: Every day | ORAL | Status: DC
  Administered 2022-12-09 – 2022-12-10 (×2): 75 mg via ORAL
  Filled 2022-12-09 (×2): qty 1

## 2022-12-09 MED ORDER — PROPOFOL 500 MG/50ML IV EMUL
INTRAVENOUS | Status: DC | PRN
  Administered 2022-12-09: 125 ug/kg/min via INTRAVENOUS

## 2022-12-09 MED ORDER — SPOT INK MARKER SYRINGE KIT
PACK | SUBMUCOSAL | Status: AC
  Filled 2022-12-09: qty 5

## 2022-12-09 MED ORDER — LACTATED RINGERS IV SOLN: INTRAVENOUS | Status: DC | PRN

## 2022-12-09 MED ORDER — SPOT INK MARKER SYRINGE KIT
PACK | SUBMUCOSAL | Status: DC | PRN
  Administered 2022-12-09: 2 mL via SUBMUCOSAL

## 2022-12-09 MED ORDER — POTASSIUM CHLORIDE 10 MEQ/100ML IV SOLN
10.0000 meq | Freq: Once | INTRAVENOUS | Status: AC
  Administered 2022-12-09: 10 meq via INTRAVENOUS
  Filled 2022-12-09: qty 100

## 2022-12-09 NOTE — Progress Notes (Signed)
MEDICATION RELATED CONSULT NOTE - FOLLOW UP   Pharmacy Consult for IV Iron Indication: Fe deficiency anemia  Allergies  Allergen Reactions   Bee Venom Anaphylaxis and Swelling   Codeine Anaphylaxis and Other (See Comments)    Tongue swelled   Penicillins Anaphylaxis, Swelling and Rash    Has patient had a PCN reaction causing immediate rash, facial/tongue/throat swelling, SOB or lightheadedness with hypotension: No, delayed Has patient had a PCN reaction causing severe rash involving mucus membranes or skin necrosis: No Has patient had a PCN reaction that required hospitalization No Has patient had a PCN reaction occurring within the last 10 years: No If all of the above answers are "NO", then may proceed with Cephalosporin use.    Bupropion Other (See Comments)    "Made my skin crawl"   Sudafed [Pseudoephedrine Hcl] Rash    Patient Measurements: Height: '5\' 3"'$  (160 cm) Weight: 60.3 kg (133 lb) IBW/kg (Calculated) : 52.4  Vital Signs: Temp: 98.1 F (36.7 C) (03/09 1220) Temp Source: Axillary (03/09 1220) BP: 159/62 (03/09 1240) Pulse Rate: 100 (03/09 1240) Intake/Output from previous day: 03/08 0701 - 03/09 0700 In: 1397.4 [I.V.:766.4; Blood:631] Out: 150 [Urine:150] Intake/Output from this shift: Total I/O In: 200 [I.V.:200] Out: 200 [Urine:200]  Labs: Recent Labs    12/08/22 1020 12/08/22 1029 12/08/22 1035 12/08/22 1125 12/08/22 1525 12/09/22 0456  WBC  --  15.2*  --   --   --  15.0*  HGB 5.8* 4.4*  --   --  4.0* 9.4*  HCT 17.0* 16.5*  --   --  15.4* 28.5*  PLT  --  824*  --   --   --  672*  APTT  --   --   --  34  --   --   CREATININE 0.60  --  0.84  --   --  0.62  MG  --   --   --   --   --  2.0  ALBUMIN  --   --   --   --   --  2.3*  PROT  --   --   --   --   --  6.1*  AST  --   --   --   --   --  18  ALT  --   --   --   --   --  11  ALKPHOS  --   --   --   --   --  98  BILITOT  --   --   --   --   --  1.8*   Estimated Creatinine Clearance: 58  mL/min (by C-G formula based on SCr of 0.62 mg/dL).   Microbiology: No results found for this or any previous visit (from the past 720 hour(s)).  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  Found to have a Hgb ~5 and instructed to come to [facility] ED.  Patient with Fe deficiency anemia and ABLA.  Patient is s/p transfusion of 2 PRBC with good response.  Anemia panel obtained prior to blood transfusion with Fe 14, Tsat 4%, Ferritin 11.  Goal of Therapy:  Fe > 28; Hgb ideally >12 but transfusion threshold of 7  Plan:  Ferric Gluconate '250mg'$  IV q24h x 2 doses Consider oral Fe supplementation for maintenance on discharge.   [employer], Pharm.D., BCPS Clinical Pharmacist  **Pharmacist phone directory  can be found on [email or web address] listed under [place].  12/09/2022 2:18 PM

## 2022-12-09 NOTE — Interval H&P Note (Signed)
History and Physical Interval Note: PAtient did well overnight. No bleeding. She responded appropriately to PRBC transfusion. I have discussed enteroscopy with AVM ablation with her, risks / benefits, she wishes to proceed. Further recommendations pending the results.   12/09/2022 11:24 AM  Suszanne Finch  has presented today for surgery, with the diagnosis of iron deficiency anemia, history of AVMs.  The various methods of treatment have been discussed with the patient and family. After consideration of risks, benefits and other options for treatment, the patient has consented to  Procedure(s): ENTEROSCOPY (N/A) as a surgical intervention.  The patient's history has been reviewed, patient examined, no change in status, stable for surgery.  I have reviewed the patient's chart and labs.  Questions were answered to the patient's satisfaction.     [place]

## 2022-12-09 NOTE — Progress Notes (Signed)
PROGRESS NOTE Tammy Mcpherson  [id number] DOB: [birth year] DOA: 12/08/2022 PCP: Inda Coke, PA  Brief Narrative/Hospital Course: 67 y/o with multiple co-morbidities including DM, HTN, COPD, h/o GI bleeding from gastric and intestinal AVMs, h/o profound iron deficiency anemia. Pre-op labs prior to angiogram LE revealed Hgb 5.8 leading to direct referral to [facility]. She denies chest pain, respiratory symptoms. She endorses fatigue. She denies seeing any signs of bleeding: no hematemesis, melena, hematochezia or BRBPR.   ED Course: T 97.7  156/44  HR 88  RR 12. Appeared very pale but in no distress. Lab - Hgb 4.4, WBC 15.2, plts 824, K 3,3, glucose 190, lactic acid 3.8, INR 1.2. Stool heme negative. TRH called to admit for mgt of severe anemia.      Subjective: Seen and examined this morning alert awake resting comfortably Awaiting for EGD procedure   Overnight patient has been afebrile blood pressures running 150s-170s, on 2 L nasal cannula. Labs reviewed this morning potassium 3.2 bilirubin 1.8 leukocytosis 15.0 about the same hemoglobin improved to 9.4   Assessment and Plan: Principal Problem:   Severe anemia Active Problems:   Tobacco abuse   Type 2 diabetes mellitus (HCC)   Acute combined systolic and diastolic heart failure (HCC)   Chronic obstructive pulmonary disease (HCC)   Essential hypertension   Antiplatelet or antithrombotic long-term use   Lower limb ischemia  Severe iron deficiency anemia-ABLA History of AVMs-but 20 AVMs ablated in January Recurrent anemia FOBT negative: CT abdomen no acute finding, status post multiple PRBC transfusion hemoglobin improved.  Anemia panel shows saturation for iron 14 normal folate B12, ferritin 11.  No overt bleeding, similar issue in January at enteroscopy, had 20 AVMs ablated, her last IV iron infusion was in November, she takes aspirin and Plavix.  GI has seen him for repeat enteroscopy, and also advised iron infusion. Recent Labs   Lab 12/08/22 1020 12/08/22 1029 12/08/22 1525 12/09/22 0456  HGB 5.8* 4.4* 4.0* 9.4*  HCT 17.0* 16.5* 15.4* 28.5*    PAD with lower leg pain/lower limb ischemia outpatient angiogram was canceled and admitted due to severe anemia.  Seen by Dr. Stanford Breed.May be able to schedule angiogram Monday.  Followed by Dr. Luan Pulling. GERD continue PPI COPD with ongoing tobacco abuse: No wheezing continue home inhalers. IBS-D: symptomatic management Chronic combined systolic diastolic heart failure, chronic last EF 60 to 65% G1 DD: On Entresto and potassium supplement Essential hypertension: BP stable/on higher side, on Entresto Hypokalemia replace IV HLD on statin  DVT prophylaxis: Place TED hose Start: 12/08/22 1516 Code Status:   Code Status: DNR Family Communication: plan of care discussed with patient/husband at bedside. Patient status is:  admitted as observation but remains hospitalized for ongoing  because of ongoing anemia workup Level of care: Med-Surg   Dispo: The patient is from: home            Anticipated disposition: tbd Objective: Vitals last 24 hrs: Vitals:   12/09/22 0500 12/09/22 0744 12/09/22 0900 12/09/22 1116  BP: (!) 168/58  (!) 158/60 (!) 156/66  Pulse: 97  87 92  Resp: '18  18 20  '$ Temp: 97.6 F (36.4 C)  98 F (36.7 C) (!) 97.3 F (36.3 C)  TempSrc: Oral  Oral Temporal  SpO2: 98% 98% 98% 93%  Weight:      Height:       Weight change:   Physical Examination: General exam: alert awake, older than stated age HEENT:Oral mucosa moist, Ear/Nose WNL grossly Respiratory system:  bilaterally clear BS, no use of accessory muscle Cardiovascular system: S1 & S2 +, No JVD. Gastrointestinal system: Abdomen soft,NT,ND, BS+ Nervous System:Alert, awake, moving extremities. Extremities: LE edema mild see pic,distal peripheral pulses palpable.  Skin: No rashes,no icterus. MSK: Normal muscle bulk,tone, power  Medications reviewed:  Scheduled Meds:  [MAR Hold] atorvastatin   80 mg Oral Daily   [MAR Hold] FLUoxetine  40 mg Oral Daily   [MAR Hold] fluticasone furoate-vilanterol  1 puff Inhalation Daily   [MAR Hold] gabapentin  300 mg Oral TID   [MAR Hold] metFORMIN  500 mg Oral Q breakfast   [MAR Hold] pantoprazole  40 mg Oral Daily   [MAR Hold] sacubitril-valsartan  1 tablet Oral Daily   [MAR Hold] umeclidinium bromide  1 puff Inhalation Daily   Continuous Infusions:  sodium chloride 50 mL/hr at 12/09/22 [id number]   potassium chloride 10 mEq (12/09/22 1117)      Diet Order             Diet NPO time specified Except for: Sips with Meds  Diet effective midnight                  Intake/Output Summary (Last 24 hours) at 12/09/2022 1131 Last data filed at 12/09/2022 0900 Gross per 24 hour  Intake 1397.38 ml  Output 150 ml  Net 1247.38 ml   Net IO Since Admission: 1,247.38 mL [12/09/22 1131]  Wt Readings from Last 3 Encounters:  12/08/22 60.3 kg  12/05/22 60.8 kg  11/06/22 60.8 kg     Unresulted Labs (From admission, onward)     Start     Ordered   12/09/22 1127  Glucose, capillary  Once,   R        12/09/22 1127          Data Reviewed: I have personally reviewed following labs and imaging studies CBC: Recent Labs  Lab 12/08/22 1020 12/08/22 1029 12/08/22 1525 12/09/22 0456  WBC  --  15.2*  --  15.0*  HGB 5.8* 4.4* 4.0* 9.4*  HCT 17.0* 16.5* 15.4* 28.5*  MCV  --  74.7*  --  76.6*  PLT  --  824*  --  [id number]*   Basic Metabolic Panel: Recent Labs  Lab 12/08/22 1020 12/08/22 1035 12/09/22 0456  NA 133* 135 135  K 3.3* 3.3* 3.2*  CL 95* 96* 101  CO2  --  23 25  GLUCOSE 190* 191* 96  BUN 7* 7* 6*  CREATININE 0.60 0.84 0.62  CALCIUM  --  8.6* 8.1*  MG  --   --  2.0   Recent Labs  Lab 12/09/22 0456  AST 18  ALT 11  ALKPHOS 98  BILITOT 1.8*  PROT 6.1*  ALBUMIN 2.3*   Recent Labs  Lab 12/08/22 1125  INR 1.2   Recent Labs  Lab 12/08/22 1211  LATICACIDVEN 3.8*    No results found for this or any previous visit (from  the past 240 hour(s)).  Antimicrobials: Anti-infectives (From admission, onward)    None      Culture/Microbiology    Component Value Date/Time   SDES  10/28/2022 0716    RIGHT ANTECUBITAL BOTTLES DRAWN AEROBIC AND ANAEROBIC BLOOD Performed at [facility], [place] [address], [place], [place] [address]    SDES  10/28/2022 [phone]    LEFT ANTECUBITAL BOTTLES DRAWN AEROBIC AND ANAEROBIC Performed at [facility], [address], [place], [place] [address]    [facility]  10/28/2022 [phone]  Blood Culture results may not be optimal due to an excessive volume of blood received in culture bottles Performed at [facility], [address] [place], [place] [address]    [facility]  10/28/2022 [phone]    Blood Culture adequate volume Performed at [facility], [address] [place], [place] [address]    CULT (A) 10/28/2022 [id number]    BACILLUS SPECIES Standardized susceptibility testing for this organism is not available. Performed at [facility], [place] [address], [place], [place] [address]    CULT  10/28/2022 [phone]    NO GROWTH 5 DAYS Performed at [facility], [place] [address] [place], [place] [address]    REPTSTATUS 10/30/2022 FINAL 10/28/2022 [id number]   REPTSTATUS 11/02/2022 FINAL 10/28/2022 0716  Radiology Studies: CT ABDOMEN PELVIS W CONTRAST  Result Date: 12/08/2022 CLINICAL DATA:  Acute generalized abdominal pain. EXAM: CT ABDOMEN AND PELVIS WITH CONTRAST TECHNIQUE: Multidetector CT imaging of the abdomen and pelvis was performed using the standard protocol following bolus administration of intravenous contrast. RADIATION DOSE REDUCTION: This exam was performed according to the departmental dose-optimization program which includes automated exposure control, adjustment of the mA and/or kV according to patient size and/or use of iterative reconstruction technique. CONTRAST:  10m OMNIPAQUE IOHEXOL 350 MG/ML SOLN COMPARISON:  June 17, 2020. FINDINGS: Lower chest: No acute  abnormality. Hepatobiliary: No focal liver abnormality is seen. Status post cholecystectomy. No biliary dilatation. Pancreas: Unremarkable. No pancreatic ductal dilatation or surrounding inflammatory changes. Spleen: Normal in size without focal abnormality. Adrenals/Urinary Tract: Adrenal glands are unremarkable. Kidneys are normal, without renal calculi, focal lesion, or hydronephrosis. Bladder is unremarkable. Stomach/Bowel: Stomach is within normal limits. Appendix appears normal. No evidence of bowel wall thickening, distention, or inflammatory changes. Vascular/Lymphatic: Aortic atherosclerosis. No enlarged abdominal or pelvic lymph nodes. Reproductive: Status post hysterectomy. No adnexal masses. Other: No abdominal wall hernia or abnormality. No abdominopelvic ascites. Musculoskeletal: No acute or significant osseous findings. IMPRESSION: No acute abnormality seen in the abdomen or pelvis. Aortic Atherosclerosis (ICD10-I70.0). Electronically Signed   By: JMarijo ConceptionM.D.   On: 12/08/2022 13:33     LOS: 0 days   RAntonieta Pert MD [facility]  12/09/2022, 11:31 AM

## 2022-12-09 NOTE — Progress Notes (Signed)
[facility] [place] PROGRESS NOTE  ASSESSMENT / PLAN: Tammy STOFER is a 67 y.o. female with bilateral chronic limb threatening ischemia with ulceration. Planned angiogram yestrerday postponed because of severe anemia. Good response to transfusion. Appreciate GI team's work. I will plan to delay intervention until GI is satisfied with workup. Unless there are objections, I will check a CT angiogram tomorrow AM to better evaluate what intervention options exist from a vascular standpoint. Following along.   SUBJECTIVE: No complaints. Feels better today. No obvious bleeding.  OBJECTIVE: BP (!) 158/60 (BP Location: Left Arm)   Pulse 87   Temp 98 F (36.7 C) (Oral)   Resp 18   Ht '5\' 3"'$  (1.6 m)   Wt 60.3 kg   SpO2 98%   BMI 23.56 kg/m   Intake/Output Summary (Last 24 hours) at 12/09/2022 1110 Last data filed at 12/09/2022 0900 Gross per 24 hour  Intake 1397.38 ml  Output 150 ml  Net 1247.38 ml    Chronically ill No distress Regular rate and rhythm Unlabored breathing + DS in pedal arteries bilaterally     Latest Ref Rng & Units 12/09/2022    4:56 AM 12/08/2022    3:25 PM 12/08/2022   10:29 AM  CBC  WBC 4.0 - 10.5 K/uL 15.0   15.2   Hemoglobin 12.0 - 15.0 g/dL 9.4  4.0  4.4   Hematocrit 36.0 - 46.0 % 28.5  15.4  16.5   Platelets 150 - 400 K/uL 672   824         Latest Ref Rng & Units 12/09/2022    4:56 AM 12/08/2022   10:35 AM 12/08/2022   10:20 AM  CMP  Glucose 70 - 99 mg/dL 96  191  190   BUN 8 - 23 mg/dL '6  7  7   '$ Creatinine 0.44 - 1.00 mg/dL 0.62  0.84  0.60   Sodium 135 - 145 mmol/L 135  135  133   Potassium 3.5 - 5.1 mmol/L 3.2  3.3  3.3   Chloride 98 - 111 mmol/L 101  96  95   CO2 22 - 32 mmol/L 25  23    Calcium 8.9 - 10.3 mg/dL 8.1  8.6    Total Protein 6.5 - 8.1 g/dL 6.1     Total Bilirubin 0.3 - 1.2 mg/dL 1.8     Alkaline Phos 38 - 126 U/L 98     AST 15 - 41 U/L 18     ALT 0 - 44 U/L 11       Estimated Creatinine Clearance: 58 mL/min (by  C-G formula based on SCr of 0.62 mg/dL).  Tammy Mcpherson. Stanford Breed, MD [facility] Phone Number: [phone] 12/09/2022 11:10 AM

## 2022-12-09 NOTE — Op Note (Signed)
[facility] Patient Name: Tammy Mcpherson Procedure Date : 12/09/2022 MRN: [id number] Attending MD: Carlota Raspberry. Havery Moros , MD, [id number] Date of Birth: January 27, 1956 CSN: [id number] Age: 67 Admit Type: Inpatient Procedure:                Small bowel enteroscopy Indications:              Iron deficiency anemia, history of numerous (20)                            small bowel AVMs ablated in January, history of IV                            iron in November, recurrent severe iron deficiency                            without overt bleeding. On Plavix for severe PAD Providers:                Carlota Raspberry. Havery Moros, MD, Jeanella Cara,                            RN, Fransico Setters Mbumina, Technician Referring MD:              Medicines:                Monitored Anesthesia Care Complications:            No immediate complications. Estimated blood loss:                            Minimal. Estimated Blood Loss:     Estimated blood loss was minimal. Procedure:                Pre-Anesthesia Assessment:                           - Prior to the procedure, a History and Physical                            was performed, and patient medications and                            allergies were reviewed. The patient's tolerance of                            previous anesthesia was also reviewed. The risks                            and benefits of the procedure and the sedation                            options and risks were discussed with the patient.                            All questions were answered, and informed consent  was obtained. Prior Anticoagulants: The patient has                            taken Plavix (clopidogrel), last dose was 2 days                            prior to procedure. ASA Grade Assessment: III - A                            patient with severe systemic disease. After                            reviewing the risks and benefits, the  patient was                            deemed in satisfactory condition to undergo the                            procedure.                           After obtaining informed consent, the endoscope was                            passed under direct vision. Throughout the                            procedure, the patient's blood pressure, pulse, and                            oxygen saturations were monitored continuously. The                            [id number] Olympus slim colonoscope was                            introduced through the mouth and advanced to the                            proximal jejunum. The small bowel enteroscopy was                            accomplished without difficulty. The patient                            tolerated the procedure well. Scope In: Scope Out: Findings:      The Z-line was regular.      The exam of the esophagus was otherwise normal.      A single localized erosion with no stigmata of recent bleeding was found       in the gastric body.      The exam of the stomach was otherwise normal.      A few angiodysplastic lesions with no bleeding were found in the third       portion of the  duodenum and in the fourth portion of the duodenum       (approximately 4). Fulguration to ablate the lesions to prevent bleeding       by argon plasma was successful.      The exam of the duodenum was otherwise normal.      A few angiodysplastic lesions with no bleeding were found in the       proximal jejunum (approximately 6). Fulguration to ablate the lesions to       prevent bleeding by argon plasma was successful. Area of the distal most       aspect reached in the jejunum was tattooed with an injection of Spot       (carbon black).      Exam of the jejunum was otherwise normal. Impression:               - Z-line regular.                           - Normal esophagus otherwise.                           - Gastric erosion with no stigmata of  recent                            bleeding.                           - Normal stomach otherwise                           - 10 AVMs in the distal duodenum / jejunum, most in                            the jejunum. Ablated with APC. Distal aspect of                            jejunum reached was tattooed in case capsule                            endoscopy is entertained in the future to clarify                            if further AVMs are within reach of enteroscope. Recommendation:           - Return patient to hospital ward for ongoing care.                           - Full liquid diet now, advance as tolerated                           - Continue present medications.                           - Okay to resume Plavix for severe PAD (she has not  had any overt bleeding)                           - IV iron infusion while in the hospital                           - Upon discharge she will need her Hgb closely                            monitored and IV iron infusions periodically to                            keep Hgb stable (she has proven on Plavix her Hgb                            will likely drift)                           - Consideration for octreotide depot injections as                            outpatient if recurrent severe anemia occurs                            despite close monitoring and IV iron                           - Consideration for capsule endoscopy to assess                            further distal small bowel and assess for a dominan                            lesion causing anemia should severe anemia recur                            despite IV iron                           - She can follow up with her primary GI MD                            [facility]) after this hospitalization to discuss                            these options. Again recommend very close                            monitoring of Hgb as outpatient                            - We will sign off for now, please call with  questions moving forward. Procedure Code(s):        --- Professional ---                           [phone], Small intestinal endoscopy, enteroscopy                            beyond second portion of duodenum, not including                            ileum; with control of bleeding (eg, injection,                            bipolar cautery, unipolar cautery, laser, heater                            probe, stapler, plasma coagulator)                           44799, Unlisted procedure, small intestine Diagnosis Code(s):        --- Professional ---                           K25.9, Gastric ulcer, unspecified as acute or                            chronic, without hemorrhage or perforation                           K31.819, Angiodysplasia of stomach and duodenum                            without bleeding                           D50.9, Iron deficiency anemia, unspecified                           K55.20, Angiodysplasia of colon without hemorrhage CPT copyright 2022 American Medical Association. All rights reserved. The codes documented in this report are preliminary and upon coder review may  be revised to meet current compliance requirements. Remo Lipps P. Malone Vanblarcom, MD 12/09/2022 12:24:00 PM This report has been signed electronically. Number of Addenda: 0

## 2022-12-09 NOTE — Plan of Care (Signed)

## 2022-12-09 NOTE — Hospital Course (Addendum)
67 y/o with multiple co-morbidities including DM, HTN, COPD, h/o GI bleeding from gastric and intestinal AVMs, h/o profound iron deficiency anemia. Pre-op labs prior to angiogram LE revealed Hgb 5.8 leading to direct referral to [facility]. She denies chest pain, respiratory symptoms. She endorses fatigue. She denies seeing any signs of bleeding: no hematemesis, melena, hematochezia or BRBPR.   ED Course: T 97.7  156/44  HR 88  RR 12. Appeared very pale but in no distress. Lab - Hgb 4.4, WBC 15.2, plts 824, K 3,3, glucose 190, lactic acid 3.8, INR 1.2. Stool heme negative. TRH called to admit for mgt of severe anemia.  Underwent multiple  units blood transfusion> hemoglobin improved to 9.4.  GI was consulted with plan for enteroscopy 12/09/22 due to concern for recurrent bleeding AVM. Endoscopy showed 10 small bowel AVMs ablated.  Per GI recommendation Plavix resumed given severe PAD, IV iron ordered and monitored 1 more day.  Discussed with Dr. Standley Dakins who will be planning for outpatient angiogram probably next Friday, and he will follow-up as outpatient.   Patient lab studies still hemoglobin but WBC uptrending however she is afebrile asymptomatic overall feeling much improved this is likely from stress/procedure/reactive, she is advised to follow-up with PCP to check CBC next week.

## 2022-12-09 NOTE — Anesthesia Postprocedure Evaluation (Signed)
Anesthesia Post Note  Patient: Tammy Mcpherson  Procedure(s) Performed: ENTEROSCOPY HOT HEMOSTASIS (ARGON PLASMA COAGULATION/BICAP) SUBMUCOSAL TATTOO INJECTION     Patient location during evaluation: PACU Anesthesia Type: MAC Level of consciousness: awake and alert Pain management: pain level controlled Vital Signs Assessment: post-procedure vital signs reviewed and stable Respiratory status: spontaneous breathing, nonlabored ventilation, respiratory function stable and patient connected to nasal cannula oxygen Cardiovascular status: stable and blood pressure returned to baseline Postop Assessment: no apparent nausea or vomiting Anesthetic complications: no   No notable events documented.  Last Vitals:  Vitals:   12/09/22 1230 12/09/22 1240  BP: (!) 147/59 (!) 159/62  Pulse: 90 100  Resp: (!) 22 16  Temp:    SpO2: 97% 93%    Last Pain:  Vitals:   12/09/22 1240  TempSrc:   PainSc: 0-No pain                 [name]

## 2022-12-09 NOTE — Anesthesia Preprocedure Evaluation (Addendum)
Anesthesia Evaluation  Patient identified by MRN, date of birth, ID band Patient awake    Reviewed: Allergy & Precautions, NPO status , Patient's Chart, lab work & pertinent test results  Airway Mallampati: II  TM Distance: >3 FB Neck ROM: Full    Dental  (+) Edentulous Upper, Edentulous Lower   Pulmonary COPD, Current Smoker and Patient abstained from smoking.   Pulmonary exam normal        Cardiovascular hypertension, + CAD, + Peripheral Vascular Disease and +CHF   Rhythm:Regular Rate:Normal     Neuro/Psych  Headaches  Anxiety        GI/Hepatic hiatal hernia, PUD,GERD  ,,  Endo/Other  diabetes, Type 2    Renal/GU   negative genitourinary   Musculoskeletal  (+) Arthritis , Osteoarthritis,    Abdominal Normal abdominal exam  (+)   Peds  Hematology  (+) Blood dyscrasia, anemia   Anesthesia Other Findings   Reproductive/Obstetrics                             Anesthesia Physical Anesthesia Plan  ASA: 3  Anesthesia Plan: MAC   Post-op Pain Management:    Induction:   PONV Risk Score and Plan: 1 and Propofol infusion and Treatment may vary due to age or medical condition  Airway Management Planned: Simple Face Mask and Nasal Cannula  Additional Equipment: None  Intra-op Plan:   Post-operative Plan:   Informed Consent: I have reviewed the patients History and Physical, chart, labs and discussed the procedure including the risks, benefits and alternatives for the proposed anesthesia with the patient or authorized representative who has indicated his/her understanding and acceptance.   Patient has DNR.   Dental advisory given  Plan Discussed with: CRNA  Anesthesia Plan Comments:        Anesthesia Quick Evaluation

## 2022-12-09 NOTE — Consult Note (Signed)
[facility] Nurse Consult Note: Reason for Consult:Bilateral foot ulcerations. WOC Nursing is simultaneously consulted with Vascular Surgery and Dr. Stanford Breed has seen and identified ischemic ulcerations. He/their service is following for an operative procedure so [place] Nursing services are not necessary. Wound type:PAD Pressure Injury POA: N/A Recommend floatation of heels and to keep areas of concern clean and dry. Defer to Vascular Surgery for wound related questions. A prophylactic silicone foam is to be placed to the sacrum to prevent PI.   WOC nursing team will not follow, but will remain available to this patient, the nursing and medical teams.  Please re-consult if needed.  Thank you for inviting [place] to participate in this patient's Plan of Care.  Maudie Flakes, MSN, RN, CNS, [place], Serita Grammes, [employer], [employer] phone:  [phone]

## 2022-12-09 NOTE — Transfer of Care (Signed)
Immediate Anesthesia Transfer of Care Note  Patient: Tammy Mcpherson  Procedure(s) Performed: ENTEROSCOPY HOT HEMOSTASIS (ARGON PLASMA COAGULATION/BICAP) SUBMUCOSAL TATTOO INJECTION  Patient Location: PACU  Anesthesia Type:MAC  Level of Consciousness: drowsy and patient cooperative  Airway & Oxygen Therapy: Patient Spontanous Breathing and Patient connected to nasal cannula oxygen  Post-op Assessment: Report given to RN and Post -op Vital signs reviewed and stable  Post vital signs: Reviewed and stable  Last Vitals:  Vitals Value Taken Time  BP 138/53 12/09/22 1219  Temp    Pulse 87 12/09/22 1221  Resp 22 12/09/22 1221  SpO2 97 % 12/09/22 1221  Vitals shown include unvalidated device data.  Last Pain:  Vitals:   12/09/22 1116  TempSrc: Temporal  PainSc: 10-Worst pain ever      Patients Stated Pain Goal: 3 ([id number])  Complications: No notable events documented.

## 2022-12-10 DIAGNOSIS — D649 Anemia, unspecified: Secondary | ICD-10-CM | POA: Diagnosis not present

## 2022-12-10 LAB — CBC
HCT: 29.3 % — ABNORMAL LOW (ref 36.0–46.0)
Hemoglobin: 9 g/dL — ABNORMAL LOW (ref 12.0–15.0)
MCH: 24.3 pg — ABNORMAL LOW (ref 26.0–34.0)
MCHC: 30.7 g/dL (ref 30.0–36.0)
MCV: 79 fL — ABNORMAL LOW (ref 80.0–100.0)
Platelets: 669 10*3/uL — ABNORMAL HIGH (ref 150–400)
RBC: 3.71 MIL/uL — ABNORMAL LOW (ref 3.87–5.11)
RDW: 19.3 % — ABNORMAL HIGH (ref 11.5–15.5)
WBC: 24.4 10*3/uL — ABNORMAL HIGH (ref 4.0–10.5)
nRBC: 0.1 % (ref 0.0–0.2)

## 2022-12-10 LAB — POTASSIUM: Potassium: 3.3 mmol/L — ABNORMAL LOW (ref 3.5–5.1)

## 2022-12-10 MED ORDER — POTASSIUM CHLORIDE CRYS ER 20 MEQ PO TBCR
20.0000 meq | EXTENDED_RELEASE_TABLET | Freq: Once | ORAL | Status: AC
  Administered 2022-12-10: 20 meq via ORAL
  Filled 2022-12-10: qty 1

## 2022-12-10 MED ORDER — POLYSACCHARIDE IRON COMPLEX 150 MG PO CAPS: 150.0000 mg | ORAL_CAPSULE | Freq: Every day | ORAL | 0 refills | Status: DC

## 2022-12-10 NOTE — Plan of Care (Signed)
Discharging home with spouse, after iron infusion is complete.

## 2022-12-10 NOTE — Progress Notes (Signed)
DISCHARGE NOTE HOME Tammy Mcpherson to be discharged Home per MD order. Diagnosis, treatment,  prescriptions and follow up appointments discussed with the patient and patiens husband who verbalized knowledge and an understanding. Medication list explained in detail.  Skin clean, dry and intact. IV catheters discontinued intact. Site without signs and symptoms of complications. Dressing and pressure applied. Pt denies pain at the site currently. No complaints noted. Patient advised to return to ED if symptoms return  An After Visit Summary (AVS) was printed and given to the patient. Patient escorted via wheelchair, and discharged home via private auto.  Virgina Jock, RN

## 2022-12-10 NOTE — Discharge Summary (Signed)
Physician Discharge Summary  Tammy Mcpherson [id number] DOB: 1956-01-17 DOA: 12/08/2022  PCP: Inda Coke, PA  Admit date: 12/08/2022 Discharge date: 12/10/2022 Recommendations for Outpatient Follow-up:  Follow up with PCP in 1 weeks-call for appointment Please obtain BMP/CBC in one week  Discharge Dispo: hOME Discharge Condition: Stable Code Status:   Code Status: DNR Diet recommendation:  Diet Order             DIET SOFT Room service appropriate? Yes; Fluid consistency: Thin  Diet effective now                 Brief/Interim Summary: 67 y/o with multiple co-morbidities including DM, HTN, COPD, h/o GI bleeding from gastric and intestinal AVMs, h/o profound iron deficiency anemia. Pre-op labs prior to angiogram LE revealed Hgb 5.8 leading to direct referral to [facility]. She denies chest pain, respiratory symptoms. She endorses fatigue. She denies seeing any signs of bleeding: no hematemesis, melena, hematochezia or BRBPR.   ED Course: T 97.7  156/44  HR 88  RR 12. Appeared very pale but in no distress. Lab - Hgb 4.4, WBC 15.2, plts 824, K 3,3, glucose 190, lactic acid 3.8, INR 1.2. Stool heme negative. TRH called to admit for mgt of severe anemia.  Underwent multiple  units blood transfusion> hemoglobin improved to 9.4.  GI was consulted with plan for enteroscopy 12/09/22 due to concern for recurrent bleeding AVM. Endoscopy showed 10 small bowel AVMs ablated.  Per GI recommendation Plavix resumed given severe PAD, IV iron ordered and monitored 1 more day.  Discussed with Dr. Standley Dakins who will be planning for outpatient angiogram probably next Friday, and he will follow-up as outpatient.   Patient lab studies still hemoglobin but WBC uptrending however she is afebrile asymptomatic overall feeling much improved this is likely from stress/procedure/reactive, she is advised to follow-up with PCP to check CBC next week.   Discharge Diagnoses:  Principal Problem:   Severe anemia Active  Problems:   Tobacco abuse   Type 2 diabetes mellitus (HCC)   Acute combined systolic and diastolic heart failure (HCC)   Chronic obstructive pulmonary disease (HCC)   Essential hypertension   Antiplatelet or antithrombotic long-term use   Lower limb ischemia  Severe iron deficiency anemia-ABLA History of AVMs-but 20 AVMs ablated in January Recurrent anemia FOBT negative: CT abdomen no acute finding, status post multiple PRBC transfusion hemoglobin improved/stabilized no recurrent bleeding, underwent enteroscopy 3/9- 10 small bowel AVMs ablated.  Per GI recommendation Plavix resumed given severe PAD, IV iron ordered and will get 1 more dose prior to discharge today.  Okay to also resume aspirin on discharge.  Also will provide prescription for iron supplementation.  Will need very frequent monitoring of hemoglobin discharge instructed to the patient and the husband at the bedside starting from midweek coming week Recent Labs  Lab 12/08/22 1020 12/08/22 1029 12/08/22 1525 12/09/22 0456 12/10/22 0434  HGB 5.8* 4.4* 4.0* 9.4* 9.0*  HCT 17.0* 16.5* 15.4* 28.5* 29.3*    PAD with lower leg pain/lower limb ischemia outpatient angiogram was canceled and admitted due to severe anemia.  Seen by Dr. Jackqulyn Livings COMMUNICATED W/ DR Stanford Breed- e is planning to do outpatient procedure sometime on Friday, resuming aspirin Plavix statin- he will arrange for follow up from office and okay for discharge home today GERD continue PPI COPD with ongoing tobacco abuse: No wheezing continue home inhalers. IBS-D: symptomatic management Chronic combined systolic diastolic heart failure, chronic last EF 60 to 65% G1 DD: On Entresto  and potassium supplement Essential hypertension: BP stable/on higher side, on Entresto Hypokalemia replaced HLD on statin   Consults: GI and vascular surgery Subjective: Alert awake oriented feels much improved had BM yesterday no bleeding. She feels ready for discharge home after  completing her iron transfusion later today  Discharge Exam: Vitals:   12/09/22 1936 12/10/22 0927  BP: (!) 168/72 (!) 161/59  Pulse: 100 (!) 108  Resp: 18 18  Temp: 98.2 F (36.8 C) 98.6 F (37 C)  SpO2: 92% 95%   General: Pt is alert, awake, not in acute distress Cardiovascular: RRR, S1/S2 +, no rubs, no gallops Respiratory: CTA bilaterally, no wheezing, no rhonchi Abdominal: Soft, NT, ND, bowel sounds + Extremities: no edema, no cyanosis  Discharge Instructions  Discharge Instructions     Discharge instructions   Complete by: As directed    Please call call MD or return to ER for similar or worsening recurring problem that brought you to hospital or if any fever,nausea/vomiting,abdominal pain, uncontrolled pain, chest pain,  shortness of breath or any other alarming symptoms.  Please follow-up your doctor as instructed in a week time and call the office for appointment.  Please avoid alcohol, smoking, or any other illicit substance and maintain healthy habits including taking your regular medications as prescribed.  You were cared for by a hospitalist during your hospital stay. If you have any questions about your discharge medications or the care you received while you were in the hospital after you are discharged, you can call the unit and ask to speak with the hospitalist on call if the hospitalist that took care of you is not available.  Once you are discharged, your primary care physician will handle any further medical issues. Please note that NO REFILLS for any discharge medications will be authorized once you are discharged, as it is imperative that you return to your primary care physician (or establish a relationship with a primary care physician if you do not have one) for your aftercare needs so that they can reassess your need for medications and monitor your lab values   Increase activity slowly   Complete by: As directed       Allergies as of 12/10/2022        Reactions   Bee Venom Anaphylaxis, Swelling   Codeine Anaphylaxis, Other (See Comments)   Tongue swelled   Penicillins Anaphylaxis, Swelling, Rash   Has patient had a PCN reaction causing immediate rash, facial/tongue/throat swelling, SOB or lightheadedness with hypotension: No, delayed Has patient had a PCN reaction causing severe rash involving mucus membranes or skin necrosis: No Has patient had a PCN reaction that required hospitalization No Has patient had a PCN reaction occurring within the last 10 years: No If all of the above answers are "NO", then may proceed with Cephalosporin use.   Bupropion Other (See Comments)   "Made my skin crawl"   Sudafed [pseudoephedrine Hcl] Rash        Medication List     TAKE these medications    Acetaminophen 500 MG capsule Take 500-1,000 mg by mouth every 6 (six) hours as needed for pain (or headaches). What changed: Another medication with the same name was removed. Continue taking this medication, and follow the directions you see here.   albuterol (2.5 MG/3ML) 0.083% nebulizer solution Commonly known as: PROVENTIL Take 3 mLs (2.5 mg total) by nebulization every 4 (four) hours as needed for wheezing or shortness of breath. What changed: Another medication with the same  name was changed. Make sure you understand how and when to take each.   albuterol 108 (90 Base) MCG/ACT inhaler Commonly known as: VENTOLIN HFA INHALE TWO PUFFS INTO THE LUNGS EVERY SIX HOURS AS NEEDED FOR WHEEZING OR SHORTNESS OF BREATH What changed:  how much to take how to take this when to take this reasons to take this additional instructions   ALPRAZolam 0.5 MG tablet Commonly known as: XANAX TAKE ONE TABLET (0.'5MG'$  TOTAL) BY MOUTH DAILY AS NEEDED FOR ANXIETY What changed:  how much to take how to take this when to take this reasons to take this additional instructions   aspirin 81 MG chewable tablet Chew 1 tablet (81 mg total) by mouth every morning.    atorvastatin 80 MG tablet Commonly known as: LIPITOR Take 1 tablet (80 mg total) by mouth daily. What changed: when to take this   blood glucose meter kit and supplies Dispense based on patient and insurance preference. Use up to four times daily as directed. (FOR ICD-10 E10.9, E11.9).   Breztri Aerosphere 160-9-4.8 MCG/ACT Aero Generic drug: Budeson-Glycopyrrol-Formoterol Inhale 2 puffs into the lungs 2 (two) times daily.   clopidogrel 75 MG tablet Commonly known as: PLAVIX Take 1 tablet (75 mg total) by mouth daily. TAKE ONE TABLET ('75MG'$  TOTAL) BY MOUTH DAILY WITH BREAKFAST What changed:  when to take this additional instructions   cyanocobalamin 1000 MCG/ML injection Commonly known as: VITAMIN B12 INJECT 1 ML INTO THE MUSCLE EVERY 30 DAYS. What changed: See the new instructions.   cyclobenzaprine 5 MG tablet Commonly known as: FLEXERIL Take 1 tablet (5 mg total) by mouth 3 (three) times daily as needed for muscle spasms.   Entresto 49-51 MG Generic drug: sacubitril-valsartan Take 1 tablet by mouth 2 (two) times daily. What changed: when to take this   FLUoxetine 40 MG capsule Commonly known as: PROZAC Take 1 capsule (40 mg total) by mouth daily.   FreeStyle [place] 2 Reader [facility] Check blood glucose up to 4 times daily   FreeStyle Libre 2 Sensor Misc Apply one sensor to the upper arm avery 14 days. What changed:  how much to take how to take this when to take this additional instructions   gabapentin 300 MG capsule Commonly known as: NEURONTIN TAKE ONE CAPSULE (300 MG TOTAL) BY MOUTHTWO TIMES DAILY. What changed:  how much to take how to take this when to take this additional instructions   glucose blood test strip Commonly known as: OneTouch Ultra Use as instructed   glucose monitoring kit monitoring kit Use to check blood sugar BID as directed   iron polysaccharides 150 MG capsule Commonly known as: NIFEREX Take 1 capsule (150 mg total) by mouth  daily.   metFORMIN 500 MG 24 hr tablet Commonly known as: GLUCOPHAGE-XR Take 1 tablet (500 mg total) by mouth daily with breakfast. What changed:  when to take this reasons to take this   Misc. Devices Misc Please provide supplies (needle, syringe, alcohol swabs) needed for patient to self-administer B-12 injections monthly.   OneTouch Delica Plus 0000000 Misc 1 each by Does not apply route 2 (two) times a day.   pantoprazole 40 MG tablet Commonly known as: PROTONIX TAKE ONE (1) TABLET 30 MINS BEFORE YOUR FIRST MEAL. What changed:  how much to take how to take this when to take this additional instructions   traMADol 50 MG tablet Commonly known as: ULTRAM Take 1 tablet (50 mg total) by mouth every 12 (twelve) hours as needed.  What changed: when to take this   UltiCare Tuberculin Safety Syr 25G X 1" 1 ML Misc Generic drug: Tuberculin-Allergy Syringes USE TO ADMINISTER INJECTABLE VITAMIN B12.   Vitamin D3 50 MCG (2000 UT) Tabs Generic drug: Cholecalciferol Take 2,000 Units by mouth daily.   Vitamin D 50 MCG (2000 UT) Caps Take 1 capsule (2,000 Units total) by mouth daily.        Follow-up Information     Inda Coke, [place] Follow up today.   Specialty: Physician Assistant Why: pcpc for cbc check in 3 days Contact information: [place] [address] [phone]                Allergies  Allergen Reactions   Bee Venom Anaphylaxis and Swelling   Codeine Anaphylaxis and Other (See Comments)    Tongue swelled   Penicillins Anaphylaxis, Swelling and Rash    Has patient had a PCN reaction causing immediate rash, facial/tongue/throat swelling, SOB or lightheadedness with hypotension: No, delayed Has patient had a PCN reaction causing severe rash involving mucus membranes or skin necrosis: No Has patient had a PCN reaction that required hospitalization No Has patient had a PCN reaction occurring within the last 10 years: No If all of  the above answers are "NO", then may proceed with Cephalosporin use.    Bupropion Other (See Comments)    "Made my skin crawl"   Sudafed [Pseudoephedrine Hcl] Rash    The results of significant diagnostics from this hospitalization (including imaging, microbiology, ancillary and laboratory) are listed below for reference.    Microbiology: No results found for this or any previous visit (from the past 240 hour(s)).  Procedures/Studies: CT ABDOMEN PELVIS W CONTRAST  Result Date: 12/08/2022 CLINICAL DATA:  Acute generalized abdominal pain. EXAM: CT ABDOMEN AND PELVIS WITH CONTRAST TECHNIQUE: Multidetector CT imaging of the abdomen and pelvis was performed using the standard protocol following bolus administration of intravenous contrast. RADIATION DOSE REDUCTION: This exam was performed according to the departmental dose-optimization program which includes automated exposure control, adjustment of the mA and/or kV according to patient size and/or use of iterative reconstruction technique. CONTRAST:  5m OMNIPAQUE IOHEXOL 350 MG/ML SOLN COMPARISON:  June 17, 2020. FINDINGS: Lower chest: No acute abnormality. Hepatobiliary: No focal liver abnormality is seen. Status post cholecystectomy. No biliary dilatation. Pancreas: Unremarkable. No pancreatic ductal dilatation or surrounding inflammatory changes. Spleen: Normal in size without focal abnormality. Adrenals/Urinary Tract: Adrenal glands are unremarkable. Kidneys are normal, without renal calculi, focal lesion, or hydronephrosis. Bladder is unremarkable. Stomach/Bowel: Stomach is within normal limits. Appendix appears normal. No evidence of bowel wall thickening, distention, or inflammatory changes. Vascular/Lymphatic: Aortic atherosclerosis. No enlarged abdominal or pelvic lymph nodes. Reproductive: Status post hysterectomy. No adnexal masses. Other: No abdominal wall hernia or abnormality. No abdominopelvic ascites. Musculoskeletal: No acute or  significant osseous findings. IMPRESSION: No acute abnormality seen in the abdomen or pelvis. Aortic Atherosclerosis (ICD10-I70.0). Electronically Signed   By: JMarijo ConceptionM.D.   On: 12/08/2022 13:33   VAS [place]I WITH/WO TBI  Result Date: 12/05/2022  LOWER EXTREMITY DOPPLER STUDY Patient Name:  Tammy Mcpherson Date of Exam:   12/05/2022 Medical Rec #: [id number]     Accession #:    [id number] of Birth: [birth year]    Patient Gender: F Patient Age:   89years [facility] Procedure:      VAS [place]I WITH/WO TBI Referring Phys: --------------------------------------------------------------------------------  Indications: Ulceration, and peripheral artery disease. Ulceration of the toes              of the left foot. Patient reports ulcers have been present              approximately 10 days. High Risk Factors: Current smoker.  Vascular Interventions: 06/01/2021                         Right great toe and second toe amputation.                         05/19/2021 Right popliteal angioplasty and stenting. Performing Technologist: Ronal Fear RVS, RCS  Examination Guidelines: A complete evaluation includes at minimum, Doppler waveform signals and systolic blood pressure reading at the level of bilateral brachial, anterior tibial, and posterior tibial arteries, when vessel segments are accessible. Bilateral testing is considered an integral part of a complete examination. Photoelectric Plethysmograph (PPG) waveforms and toe systolic pressure readings are included as required and additional duplex testing as needed. Limited examinations for reoccurring indications may be performed as noted.  ABI Findings: +---------+------------------+-----+-------------------+----------+ Right    Rt Pressure (mmHg)IndexWaveform           Comment    +---------+------------------+-----+-------------------+----------+ PTA      33                0.22 dampened monophasic            +---------+------------------+-----+-------------------+----------+ DP       30                0.20 dampened monophasic           +---------+------------------+-----+-------------------+----------+ Great Toe                                          amputation +---------+------------------+-----+-------------------+----------+ +---------+------------------+-----+--------+-------+ Left     Lt Pressure (mmHg)IndexWaveformComment +---------+------------------+-----+--------+-------+ Brachial 151                                    +---------+------------------+-----+--------+-------+ PTA                             absent          +---------+------------------+-----+--------+-------+ DP                              absent          +---------+------------------+-----+--------+-------+ Great Toe                       Absent          +---------+------------------+-----+--------+-------+ +-------+-----------+-----------+------------+------------+ ABI/TBIToday's ABIToday's TBIPrevious ABIPrevious TBI +-------+-----------+-----------+------------+------------+ Right  0.22       amputation 0.90        amputation   +-------+-----------+-----------+------------+------------+ Left   absent     absent     0.97                     +-------+-----------+-----------+------------+------------+  Bilateral ABIs appear decreased compared to prior study on 09/06/2021.  Summary: Right: Resting right ankle-brachial index indicates critical limb ischemia. Left: No Doppler flow detected in  the posterior tibial or dorsalis pedis artery.  *See table(s) above for measurements and observations.  Electronically signed by Harold Barban MD on 12/05/2022 at 10:16:39 PM.    Final     Labs: BNP (last 3 results) No results for input(s): "BNP" in the last 8760 hours. Basic Metabolic Panel: Recent Labs  Lab 12/08/22 1020 12/08/22 1035 12/09/22 0456 12/10/22 1029  NA 133* 135 135  --   K  3.3* 3.3* 3.2* 3.3*  CL 95* 96* 101  --   CO2  --  23 25  --   GLUCOSE 190* 191* 96  --   BUN 7* 7* 6*  --   CREATININE 0.60 0.84 0.62  --   CALCIUM  --  8.6* 8.1*  --   MG  --   --  2.0  --    Liver Function Tests: Recent Labs  Lab 12/09/22 0456  AST 18  ALT 11  ALKPHOS 98  BILITOT 1.8*  PROT 6.1*  ALBUMIN 2.3*   No results for input(s): "LIPASE", "AMYLASE" in the last 168 hours. No results for input(s): "AMMONIA" in the last 168 hours. CBC: Recent Labs  Lab 12/08/22 1020 12/08/22 1029 12/08/22 1525 12/09/22 0456 12/10/22 0434  WBC  --  15.2*  --  15.0* 24.4*  HGB 5.8* 4.4* 4.0* 9.4* 9.0*  HCT 17.0* 16.5* 15.4* 28.5* 29.3*  MCV  --  74.7*  --  76.6* 79.0*  PLT  --  824*  --  672* 669*   Recent Labs  Lab 12/09/22 1127 12/09/22 1220  GLUCAP 113* 106*   Anemia work up Recent Labs    12/08/22 1523 12/08/22 1525  VITAMINB12 588  --   FOLATE 8.2  --   FERRITIN  --  11  TIBC  --  402  IRON  --  14*  RETICCTPCT 1.7  --    Urinalysis    Component Value Date/Time   COLORURINE STRAW (A) 10/26/2022 1540   APPEARANCEUR CLEAR 10/26/2022 1540   LABSPEC 1.004 (L) 10/26/2022 1540   PHURINE 6.0 10/26/2022 1540   GLUCOSEU NEGATIVE 10/26/2022 1540   GLUCOSEU NEGATIVE 04/26/2022 1508   HGBUR SMALL (A) 10/26/2022 1540   BILIRUBINUR NEGATIVE 10/26/2022 1540   BILIRUBINUR negative 06/17/2020 1534   KETONESUR NEGATIVE 10/26/2022 1540   PROTEINUR NEGATIVE 10/26/2022 1540   UROBILINOGEN 0.2 04/26/2022 1508   NITRITE NEGATIVE 10/26/2022 1540   LEUKOCYTESUR TRACE (A) 10/26/2022 1540   Sepsis Labs Recent Labs  Lab 12/08/22 1029 12/09/22 0456 12/10/22 0434  WBC 15.2* 15.0* 24.4*   Microbiology No results found for this or any previous visit (from the past 240 hour(s)).   Time coordinating discharge: 25 minutes  SIGNED: Antonieta Pert, MD  [facility] 12/10/2022, 2:02 PM  If 7PM-7AM, please contact night-coverage [email or web address]

## 2022-12-11 ENCOUNTER — Telehealth: Payer: Self-pay | Admitting: Physician Assistant

## 2022-12-11 ENCOUNTER — Telehealth: Payer: Self-pay

## 2022-12-11 NOTE — Transitions of Care (Post Inpatient/ED Visit) (Signed)
   12/11/2022  Name: Tammy Mcpherson MRN: [id number] DOB: Jan 20, 1956  Today's TOC FU Call Status: Today's TOC FU Call Status:: Successful TOC FU Call Competed TOC FU Call Complete Date: 12/11/22  Transition Care Management Follow-up Telephone Call Date of Discharge: 12/10/22 [facility]) Type of Discharge: Inpatient Admission Primary Inpatient Discharge Diagnosis:: Severe anemia How have you been since you were released from the hospital?: Better Any questions or concerns?: No  Items Reviewed: Did you receive and understand the discharge instructions provided?: Yes Medications obtained and verified?: Yes (Medications Reviewed) Any new allergies since your discharge?: No Dietary orders reviewed?: Yes Do you have support at home?: Yes  Home Care and Equipment/Supplies: [place] Ordered?: No Any new equipment or medical supplies ordered?: No  Functional Questionnaire: Do you need assistance with bathing/showering or dressing?: No Do you need assistance with meal preparation?: No Do you need assistance with eating?: No Do you have difficulty maintaining continence: No Do you need assistance with getting out of bed/getting out of a chair/moving?: No Do you have difficulty managing or taking your medications?: No  Folllow up appointments reviewed: PCP Follow-up appointment confirmed?: Yes Date of PCP follow-up appointment?: 12/13/22 Follow-up Provider: Inda Coke [facility] Follow-up appointment confirmed?: No Do you need transportation to your follow-up appointment?: No Do you understand care options if your condition(s) worsen?: Yes-patient verbalized understanding    [place] LPN [place] Direct Dial [phone]

## 2022-12-11 NOTE — Telephone Encounter (Signed)
Error

## 2022-12-12 ENCOUNTER — Ambulatory Visit: Payer: PPO

## 2022-12-12 ENCOUNTER — Encounter (HOSPITAL_COMMUNITY): Payer: PPO

## 2022-12-12 ENCOUNTER — Encounter (HOSPITAL_COMMUNITY): Payer: Self-pay | Admitting: Gastroenterology

## 2022-12-12 NOTE — Progress Notes (Signed)
Tammy Mcpherson is a 67 y.o. female here for a hospital follow-up.  History of Present Illness:   No chief complaint on file.   HPI  Admitted to hospital from 12/08/22 - 12/10/22 for severe anemia. CT abdomen no acute finding. Underwent multiple PRBC transfusions. Given prescription for iron polysaccharides.   Past Medical History:  Diagnosis Date   Anemia 04/30/2016   Anxiety    Arthritis    Cardiomyopathy ([place])    a. EF 40-45% by echo in 04/2016 b. Improved to 60-65% by repeat imaging in 2018   COPD (chronic obstructive pulmonary disease) (HCC)    Coronary artery calcification seen on CT scan    Essential hypertension    GERD (gastroesophageal reflux disease)    Headache    History of bronchitis    History of GI bleed    History of hiatal hernia    Hyperlipidemia    Iron deficiency anemia    Peripheral vascular disease (HCC)    Pollen allergy    PSVT (paroxysmal supraventricular tachycardia)    PVC's (premature ventricular contractions)    Type 2 diabetes mellitus (HCC)    Vitamin B12 deficiency      Social History   Tobacco Use   Smoking status: Light Smoker    Packs/day: 0.50    Years: 44.00    Total pack years: 22.00    Types: Cigarettes    Start date: 05/10/1975    Passive exposure: Never   Smokeless tobacco: Never   Tobacco comments:    2 cigarettes per day 12/10/2019  Vaping Use   Vaping Use: Former  Substance Use Topics   Alcohol use: Not Currently    Alcohol/week: 2.0 standard drinks of alcohol    Types: 2 Glasses of wine per week   Drug use: No    Past Surgical History:  Procedure Laterality Date   ABDOMINAL AORTOGRAM W/LOWER EXTREMITY Bilateral 05/19/2021   Procedure: ABDOMINAL AORTOGRAM W/LOWER EXTREMITY;  Surgeon: [name], MD;  Location: [facility];  Service: Cardiovascular;  Laterality: Bilateral;   ABDOMINAL HYSTERECTOMY     polyps  about age 29   AIKEN OSTEOTOMY Right 07/10/2013   Procedure: Barbie Banner OSTEOTOMY RIGHT FOOT;   Surgeon: Marcheta Grammes, DPM;  Location: [facility];  Service: Orthopedics;  Laterality: Right;   AMPUTATION Left 05/09/2017   Procedure: AMPUTATION 5TH TOE LEFT FOOT;  Surgeon: [name], DPM;  Location: [facility];  Service: Podiatry;  Laterality: Left;   AMPUTATION Left 06/13/2017   Procedure: AMPUTATION 2ND TOE LEFT FOOT;  Surgeon: [name], DPM;  Location: [facility];  Service: Podiatry;  Laterality: Left;   AMPUTATION Right 06/01/2021   Procedure: Right great toe and Second Toe Amputation;  Surgeon: [name], MD;  Location: [facility];  Service: Vascular;  Laterality: Right;   BIOPSY  10/27/2022   Procedure: BIOPSY;  Surgeon: Daneil Dolin, MD;  [facility]: [facility];  Service: Endoscopy;;   BUNIONECTOMY Right 07/10/2013   Procedure: Maryfrances Bunnell RIGHT FOOT;  Surgeon: Marcheta Grammes, DPM;  Location: [facility];  Service: Orthopedics;  Laterality: Right;   CHOLECYSTECTOMY N/A 08/22/2017   Procedure: LAPAROSCOPIC CHOLECYSTECTOMY;  Surgeon: Virl Cagey, MD;  Location: [facility];  Service: General;  Laterality: N/A;   COLONOSCOPY N/A 07/07/2016   Dr. Oneida Alar; redundant left colon, diverticulosis at hepatic flexure, non-bleeding internal hemorrhoids   ENTEROSCOPY N/A 10/27/2022   Procedure: ENTEROSCOPY;  Surgeon: Daneil Dolin, MD;  [facility];  Service: Endoscopy;  Laterality: N/A;   ENTEROSCOPY N/A 12/09/2022   Procedure: ENTEROSCOPY;  Surgeon: Yetta Flock, MD;  Location[facility];  Service: Gastroenterology;  Laterality: N/A;   ESOPHAGOGASTRODUODENOSCOPY N/A 07/07/2016   Dr. Oneida Alar: many non-bleeding cratered gastric ulcers without stigmata of bleeding in gastric antrum. four 2-3 mm angioectasias without bleeding in duodenal bulb and second portion of duodenum s/p APC. Chroni gastritis on path.    ESOPHAGOGASTRODUODENOSCOPY N/A 08/03/2017   Dr. Oneida Alar: erosive gastritis, AVMs. Found a single non-bleeding angioectasia in stomach, s/p APC  therapy. Four non-bleeding angioectasias in duodenum s/p APC. Non-bleeding erosive gastropathy   HOT HEMOSTASIS  10/27/2022   Procedure: HOT HEMOSTASIS (ARGON PLASMA COAGULATION/BICAP);  Surgeon: Daneil Dolin, MD;  [facility];  Service: Endoscopy;;   HOT HEMOSTASIS N/A 12/09/2022   Procedure: HOT HEMOSTASIS (ARGON PLASMA COAGULATION/BICAP);  Surgeon: Yetta Flock, MD;  Location[facility];  Service: Gastroenterology;  Laterality: N/A;   IR ANGIOGRAM EXTREMITY LEFT  04/24/2017   IR FEM POP ART ATHERECT INC PTA MOD SED  04/24/2017   IR INFUSION THROMBOL ARTERIAL INITIAL (MS)  04/24/2017   IR RADIOLOGIST EVAL & MGMT  12/05/2016   IR US GUIDE VASC ACCESS RIGHT  04/24/2017   LIVER BIOPSY N/A 08/22/2017   Procedure: LIVER BIOPSY;  Surgeon: Virl Cagey, MD;  Location: [facility];  Service: General;  Laterality: N/A;   METATARSAL HEAD EXCISION Right 07/10/2013   Procedure: METATARSAL HEAD RESECTION OF DIGITS 2 AND 3 RIGHT FOOT;  Surgeon: Marcheta Grammes, DPM;  Location: [facility];  Service: Orthopedics;  Laterality: Right;   PERIPHERAL VASCULAR INTERVENTION Right 05/19/2021   Procedure: PERIPHERAL VASCULAR INTERVENTION;  Surgeon: [name], MD;  Location: [place] [facility];  Service: Cardiovascular;  Laterality: Right;   PROXIMAL INTERPHALANGEAL FUSION (PIP) Right 07/10/2013   Procedure: ARTHRODESIS PIPJ  2ND DIGIT RIGHT FOOT;  Surgeon: Marcheta Grammes, DPM;  Location: [facility];  Service: Orthopedics;  Laterality: Right;   SUBMUCOSAL TATTOO INJECTION  12/09/2022   Procedure: SUBMUCOSAL TATTOO INJECTION;  Surgeon: Yetta Flock, MD;  Location: [facility];  Service: Gastroenterology;;    Family History  Adopted: Yes  Problem Relation Age of Onset   Hypertension Father    Coronary artery disease Sister    Ulcers Maternal Grandmother    Colon cancer Neg Hx        unknown, was adopted    Allergies  Allergen Reactions   Bee Venom Anaphylaxis and Swelling    Codeine Anaphylaxis and Other (See Comments)    Tongue swelled   Penicillins Anaphylaxis, Swelling and Rash    Has patient had a PCN reaction causing immediate rash, facial/tongue/throat swelling, SOB or lightheadedness with hypotension: No, delayed Has patient had a PCN reaction causing severe rash involving mucus membranes or skin necrosis: No Has patient had a PCN reaction that required hospitalization No Has patient had a PCN reaction occurring within the last 10 years: No If all of the above answers are "NO", then may proceed with Cephalosporin use.    Bupropion Other (See Comments)    "Made my skin crawl"   Sudafed [Pseudoephedrine Hcl] Rash    Current Medications:   Current Outpatient Medications:    Acetaminophen 500 MG capsule, Take 500-1,000 mg by mouth every 6 (six) hours as needed for pain (or headaches)., Disp: , Rfl:    albuterol (PROVENTIL) (2.5 MG/3ML) 0.083% nebulizer solution, Take 3 mLs (2.5 mg total) by nebulization every 4 (four)  hours as needed for wheezing or shortness of breath., Disp: 75 mL, Rfl: 5   albuterol (VENTOLIN HFA) 108 (90 Base) MCG/ACT inhaler, INHALE TWO PUFFS INTO THE LUNGS EVERY SIX HOURS AS NEEDED FOR WHEEZING OR SHORTNESS OF BREATH (Patient taking differently: Inhale 2 puffs into the lungs every 6 (six) hours as needed for wheezing or shortness of breath.), Disp: 8.5 g, Rfl: 5   ALPRAZolam (XANAX) 0.5 MG tablet, TAKE ONE TABLET (0.'5MG'$  TOTAL) BY MOUTH DAILY AS NEEDED FOR ANXIETY (Patient taking differently: Take 0.5 mg by mouth daily as needed for anxiety.), Disp: 30 tablet, Rfl: 2   aspirin 81 MG chewable tablet, Chew 1 tablet (81 mg total) by mouth every morning., Disp: 30 tablet, Rfl: 0   atorvastatin (LIPITOR) 80 MG tablet, Take 1 tablet (80 mg total) by mouth daily. (Patient taking differently: Take 80 mg by mouth at bedtime.), Disp: 90 tablet, Rfl: 3   blood glucose meter kit and supplies, Dispense based on patient and insurance preference. Use  up to four times daily as directed. (FOR ICD-10 E10.9, E11.9)., Disp: 1 each, Rfl: 0   Budeson-Glycopyrrol-Formoterol (BREZTRI AEROSPHERE) 160-9-4.8 MCG/ACT AERO, Inhale 2 puffs into the lungs 2 (two) times daily., Disp: 11.8 g, Rfl: 12   Cholecalciferol (VITAMIN D) 50 MCG (2000 UT) CAPS, Take 1 capsule (2,000 Units total) by mouth daily., Disp: 90 capsule, Rfl: 4   Cholecalciferol (VITAMIN D3) 50 MCG (2000 UT) TABS, Take 2,000 Units by mouth daily., Disp: , Rfl:    clopidogrel (PLAVIX) 75 MG tablet, Take 1 tablet (75 mg total) by mouth daily. TAKE ONE TABLET ('75MG'$  TOTAL) BY MOUTH DAILY WITH BREAKFAST (Patient taking differently: Take 75 mg by mouth at bedtime.), Disp: 90 tablet, Rfl: 1   Continuous Blood Gluc Receiver (FREESTYLE LIBRE 2 READER) DEVI, Check blood glucose up to 4 times daily, Disp: 1 each, Rfl: 0   Continuous Blood Gluc Sensor (FREESTYLE LIBRE 2 SENSOR) MISC, Apply one sensor to the upper arm avery 14 days. (Patient taking differently: Inject 1 patch into the skin See admin instructions. Apply one sensor into the upper arm every 14 days), Disp: 6 each, Rfl: 1   cyanocobalamin (VITAMIN B12) 1000 MCG/ML injection, INJECT 1 ML INTO THE MUSCLE EVERY 30 DAYS. (Patient taking differently: Inject 1,000 mcg into the skin every 30 (thirty) days.), Disp: 1 mL, Rfl: 11   cyclobenzaprine (FLEXERIL) 5 MG tablet, Take 1 tablet (5 mg total) by mouth 3 (three) times daily as needed for muscle spasms., Disp: 45 tablet, Rfl: 3   FLUoxetine (PROZAC) 40 MG capsule, Take 1 capsule (40 mg total) by mouth daily., Disp: 90 capsule, Rfl: 3   gabapentin (NEURONTIN) 300 MG capsule, TAKE ONE CAPSULE (300 MG TOTAL) BY MOUTHTWO TIMES DAILY. (Patient taking differently: Take 300 mg by mouth 3 (three) times daily.), Disp: 180 capsule, Rfl: 1   glucose blood (ONETOUCH ULTRA) test strip, Use as instructed, Disp: 100 each, Rfl: 12   glucose monitoring kit (FREESTYLE) monitoring kit, Use to check blood sugar BID as  directed, Disp: 1 each, Rfl: 0   iron polysaccharides (NIFEREX) 150 MG capsule, Take 1 capsule (150 mg total) by mouth daily., Disp: 30 capsule, Rfl: 0   Lancets (ONETOUCH DELICA PLUS [id number]) MISC, 1 each by Does not apply route 2 (two) times a day., Disp: 100 each, Rfl: 3   metFORMIN (GLUCOPHAGE-XR) 500 MG 24 hr tablet, Take 1 tablet (500 mg total) by mouth daily with breakfast. (Patient taking differently: Take 500 mg  by mouth daily as needed (for elevated BGL).), Disp: 90 tablet, Rfl: 1   Misc. Devices MISC, Please provide supplies (needle, syringe, alcohol swabs) needed for patient to self-administer B-12 injections monthly., Disp: 1 each, Rfl: 12   pantoprazole (PROTONIX) 40 MG tablet, TAKE ONE (1) TABLET 30 MINS BEFORE YOUR FIRST MEAL. (Patient taking differently: Take 40 mg by mouth at bedtime.), Disp: 90 tablet, Rfl: 3   sacubitril-valsartan (ENTRESTO) 49-51 MG, Take 1 tablet by mouth 2 (two) times daily. (Patient taking differently: Take 1 tablet by mouth in the morning and at bedtime.), Disp: 60 tablet, Rfl: 6   traMADol (ULTRAM) 50 MG tablet, Take 1 tablet (50 mg total) by mouth every 12 (twelve) hours as needed. (Patient taking differently: Take 50 mg by mouth 2 (two) times daily.), Disp: 30 tablet, Rfl: 0   Tuberculin-Allergy Syringes (ULTICARE TUBERCULIN SAFETY SYR) 25G X 1" 1 ML MISC, USE TO ADMINISTER INJECTABLE VITAMIN B12., Disp: 1 each, Rfl: 11   Review of Systems:   ROS  Vitals:   There were no vitals filed for this visit.   There is no height or weight on file to calculate BMI.  Physical Exam:   Physical Exam  Assessment and Plan:   ***   I,Alexander Ruley,acting as a scribe for Inda Coke, PA.,have documented all relevant documentation on the behalf of Inda Coke, PA,as directed by  Inda Coke, PA while in the presence of Inda Coke, [place].   ***   Inda Coke, PA-C

## 2022-12-13 ENCOUNTER — Inpatient Hospital Stay (HOSPITAL_COMMUNITY)
Admission: EM | Admit: 2022-12-13 | Discharge: 2022-12-19 | DRG: 253 | Disposition: A | Discharge status: Home / Self Care | Payer: PPO | Attending: Internal Medicine | Admitting: Internal Medicine

## 2022-12-13 ENCOUNTER — Ambulatory Visit (INDEPENDENT_AMBULATORY_CARE_PROVIDER_SITE_OTHER): Payer: PPO | Admitting: Physician Assistant

## 2022-12-13 ENCOUNTER — Encounter: Payer: Self-pay | Admitting: Physician Assistant

## 2022-12-13 ENCOUNTER — Encounter (HOSPITAL_COMMUNITY): Payer: Self-pay | Admitting: Emergency Medicine

## 2022-12-13 ENCOUNTER — Other Ambulatory Visit: Payer: Self-pay | Admitting: Physician Assistant

## 2022-12-13 ENCOUNTER — Other Ambulatory Visit: Payer: Self-pay

## 2022-12-13 ENCOUNTER — Emergency Department (HOSPITAL_COMMUNITY): Payer: PPO

## 2022-12-13 VITALS — BP 166/80 | HR 109 | Temp 97.5°F | Ht 63.0 in | Wt 134.0 lb

## 2022-12-13 DIAGNOSIS — Y838 Other surgical procedures as the cause of abnormal reaction of the patient, or of later complication, without mention of misadventure at the time of the procedure: Secondary | ICD-10-CM | POA: Diagnosis present

## 2022-12-13 DIAGNOSIS — I771 Stricture of artery: Secondary | ICD-10-CM | POA: Diagnosis present

## 2022-12-13 DIAGNOSIS — T82858A Stenosis of vascular prosthetic devices, implants and grafts, initial encounter: Secondary | ICD-10-CM | POA: Diagnosis not present

## 2022-12-13 DIAGNOSIS — D649 Anemia, unspecified: Secondary | ICD-10-CM | POA: Diagnosis not present

## 2022-12-13 DIAGNOSIS — I70248 Atherosclerosis of native arteries of left leg with ulceration of other part of lower left leg: Secondary | ICD-10-CM | POA: Diagnosis present

## 2022-12-13 DIAGNOSIS — I70223 Atherosclerosis of native arteries of extremities with rest pain, bilateral legs: Secondary | ICD-10-CM | POA: Diagnosis present

## 2022-12-13 DIAGNOSIS — E876 Hypokalemia: Secondary | ICD-10-CM | POA: Diagnosis present

## 2022-12-13 DIAGNOSIS — Z87892 Personal history of anaphylaxis: Secondary | ICD-10-CM

## 2022-12-13 DIAGNOSIS — Z79899 Other long term (current) drug therapy: Secondary | ICD-10-CM

## 2022-12-13 DIAGNOSIS — Z89429 Acquired absence of other toe(s), unspecified side: Secondary | ICD-10-CM

## 2022-12-13 DIAGNOSIS — Z8711 Personal history of peptic ulcer disease: Secondary | ICD-10-CM

## 2022-12-13 DIAGNOSIS — I110 Hypertensive heart disease with heart failure: Secondary | ICD-10-CM | POA: Diagnosis present

## 2022-12-13 DIAGNOSIS — Z90710 Acquired absence of both cervix and uterus: Secondary | ICD-10-CM

## 2022-12-13 DIAGNOSIS — Z7902 Long term (current) use of antithrombotics/antiplatelets: Secondary | ICD-10-CM

## 2022-12-13 DIAGNOSIS — I96 Gangrene, not elsewhere classified: Secondary | ICD-10-CM

## 2022-12-13 DIAGNOSIS — Z885 Allergy status to narcotic agent status: Secondary | ICD-10-CM

## 2022-12-13 DIAGNOSIS — K580 Irritable bowel syndrome with diarrhea: Secondary | ICD-10-CM | POA: Diagnosis present

## 2022-12-13 DIAGNOSIS — E11622 Type 2 diabetes mellitus with other skin ulcer: Secondary | ICD-10-CM | POA: Diagnosis present

## 2022-12-13 DIAGNOSIS — E1169 Type 2 diabetes mellitus with other specified complication: Secondary | ICD-10-CM

## 2022-12-13 DIAGNOSIS — I998 Other disorder of circulatory system: Secondary | ICD-10-CM | POA: Diagnosis present

## 2022-12-13 DIAGNOSIS — J449 Chronic obstructive pulmonary disease, unspecified: Secondary | ICD-10-CM | POA: Diagnosis present

## 2022-12-13 DIAGNOSIS — L97929 Non-pressure chronic ulcer of unspecified part of left lower leg with unspecified severity: Secondary | ICD-10-CM | POA: Diagnosis present

## 2022-12-13 DIAGNOSIS — E1152 Type 2 diabetes mellitus with diabetic peripheral angiopathy with gangrene: Secondary | ICD-10-CM | POA: Diagnosis present

## 2022-12-13 DIAGNOSIS — E871 Hypo-osmolality and hyponatremia: Secondary | ICD-10-CM | POA: Diagnosis present

## 2022-12-13 DIAGNOSIS — I2510 Atherosclerotic heart disease of native coronary artery without angina pectoris: Secondary | ICD-10-CM | POA: Diagnosis present

## 2022-12-13 DIAGNOSIS — Z7982 Long term (current) use of aspirin: Secondary | ICD-10-CM

## 2022-12-13 DIAGNOSIS — Z7984 Long term (current) use of oral hypoglycemic drugs: Secondary | ICD-10-CM

## 2022-12-13 DIAGNOSIS — E785 Hyperlipidemia, unspecified: Secondary | ICD-10-CM | POA: Diagnosis present

## 2022-12-13 DIAGNOSIS — Z880 Allergy status to penicillin: Secondary | ICD-10-CM

## 2022-12-13 DIAGNOSIS — Z8249 Family history of ischemic heart disease and other diseases of the circulatory system: Secondary | ICD-10-CM

## 2022-12-13 DIAGNOSIS — K219 Gastro-esophageal reflux disease without esophagitis: Secondary | ICD-10-CM | POA: Diagnosis present

## 2022-12-13 DIAGNOSIS — I5042 Chronic combined systolic (congestive) and diastolic (congestive) heart failure: Secondary | ICD-10-CM | POA: Diagnosis present

## 2022-12-13 DIAGNOSIS — I429 Cardiomyopathy, unspecified: Secondary | ICD-10-CM | POA: Diagnosis present

## 2022-12-13 DIAGNOSIS — F17210 Nicotine dependence, cigarettes, uncomplicated: Secondary | ICD-10-CM | POA: Diagnosis present

## 2022-12-13 DIAGNOSIS — Z91030 Bee allergy status: Secondary | ICD-10-CM

## 2022-12-13 DIAGNOSIS — R339 Retention of urine, unspecified: Secondary | ICD-10-CM | POA: Diagnosis not present

## 2022-12-13 DIAGNOSIS — L03116 Cellulitis of left lower limb: Secondary | ICD-10-CM | POA: Diagnosis present

## 2022-12-13 DIAGNOSIS — Z95820 Peripheral vascular angioplasty status with implants and grafts: Secondary | ICD-10-CM

## 2022-12-13 LAB — CBC WITH DIFFERENTIAL/PLATELET
Abs Immature Granulocytes: 0.23 10*3/uL — ABNORMAL HIGH (ref 0.00–0.07)
Basophils Absolute: 0.1 10*3/uL (ref 0.0–0.1)
Basophils Relative: 0 %
Eosinophils Absolute: 0.1 10*3/uL (ref 0.0–0.5)
Eosinophils Relative: 1 %
HCT: 36.7 % (ref 36.0–46.0)
Hemoglobin: 10.6 g/dL — ABNORMAL LOW (ref 12.0–15.0)
Immature Granulocytes: 2 %
Lymphocytes Relative: 9 %
Lymphs Abs: 1.3 10*3/uL (ref 0.7–4.0)
MCH: 24 pg — ABNORMAL LOW (ref 26.0–34.0)
MCHC: 28.9 g/dL — ABNORMAL LOW (ref 30.0–36.0)
MCV: 83 fL (ref 80.0–100.0)
Monocytes Absolute: 1.6 10*3/uL — ABNORMAL HIGH (ref 0.1–1.0)
Monocytes Relative: 11 %
Neutro Abs: 11.2 10*3/uL — ABNORMAL HIGH (ref 1.7–7.7)
Neutrophils Relative %: 77 %
Platelets: 803 10*3/uL — ABNORMAL HIGH (ref 150–400)
RBC: 4.42 MIL/uL (ref 3.87–5.11)
RDW: 23.1 % — ABNORMAL HIGH (ref 11.5–15.5)
WBC: 14.4 10*3/uL — ABNORMAL HIGH (ref 4.0–10.5)
nRBC: 0 % (ref 0.0–0.2)

## 2022-12-13 LAB — COMPREHENSIVE METABOLIC PANEL
ALT: 10 U/L (ref 0–44)
AST: 12 U/L — ABNORMAL LOW (ref 15–41)
Albumin: 2.8 g/dL — ABNORMAL LOW (ref 3.5–5.0)
Alkaline Phosphatase: 125 U/L (ref 38–126)
Anion gap: 12 (ref 5–15)
BUN: 11 mg/dL (ref 8–23)
CO2: 24 mmol/L (ref 22–32)
Calcium: 9.6 mg/dL (ref 8.9–10.3)
Chloride: 101 mmol/L (ref 98–111)
Creatinine, Ser: 1.11 mg/dL — ABNORMAL HIGH (ref 0.44–1.00)
GFR, Estimated: 55 mL/min — ABNORMAL LOW (ref >60)
Glucose, Bld: 153 mg/dL — ABNORMAL HIGH (ref 70–99)
Potassium: 3.1 mmol/L — ABNORMAL LOW (ref 3.5–5.1)
Sodium: 137 mmol/L (ref 135–145)
Total Bilirubin: 0.2 mg/dL — ABNORMAL LOW (ref 0.3–1.2)
Total Protein: 7.4 g/dL (ref 6.5–8.1)

## 2022-12-13 LAB — LACTIC ACID, PLASMA: Lactic Acid, Venous: 1.6 mmol/L (ref 0.5–1.9)

## 2022-12-13 MED ORDER — TRAMADOL HCL 50 MG PO TABS: 50.0000 mg | ORAL_TABLET | Freq: Two times a day (BID) | ORAL | 0 refills | Status: DC | PRN

## 2022-12-13 MED ORDER — OXYCODONE HCL 5 MG PO TABS
10.0000 mg | ORAL_TABLET | Freq: Once | ORAL | Status: AC
  Administered 2022-12-13: 10 mg via ORAL
  Filled 2022-12-13: qty 2

## 2022-12-13 NOTE — ED Triage Notes (Signed)
Pt sent by pcp for gangrene in left foot. Pt scheduled for procedure x2 in the past but unable to proceed because hemoglobin was too low per pt. Pt scheduled for angiogram on Friday but pcp stated that pt could not wait until then.

## 2022-12-13 NOTE — ED Provider Triage Note (Signed)
Emergency Medicine Provider Triage Evaluation Note  Tammy Mcpherson , a 67 y.o. female  was evaluated in triage.  Pt sent in by PCP office, she was there for a hospitalization follow up. She is endorsing worsening pain to the left foot with a concerning exam at her PCPs office today.  Having worsening pain to the left foot, faint DP pulse palpated.  Foot appears to be covered in dry gangrene now tracking up the leg.  No fevers. She was unable to get procedure due to low hemoglobin but was already transfused.     Review of Systems  Positive: Foot pain Negative: fever  Physical Exam  BP (!) 143/62 (BP Location: Right Arm)   Pulse (!) 112   Temp 98.3 F (36.8 C)   Resp 19   Ht '5\' 3"'$  (1.6 m)   Wt 61.2 kg   SpO2 95%   BMI 23.91 kg/m  Gen:   Awake, no distress   Resp:  Normal effort  MSK:   Moves extremities without difficulty  Other:  Faint 1+ DP pulses no palpable PT  Medical Decision Making  Medically screening exam initiated at 5:31 PM.  Appropriate orders placed.  Tammy Mcpherson was informed that the remainder of the evaluation will be completed by another provider, this initial triage assessment does not replace that evaluation, and the importance of remaining in the ED until their evaluation is complete.     Janeece Fitting, PA-C 12/13/22 1735

## 2022-12-13 NOTE — Patient Instructions (Addendum)
It was great to see you!  You have dry gangrene -- please go to the ER IMMEDIATELY.  The longer you wait to have this addressed, the increased likelihood that you will lose more of your foot/leg.  Take care,  Inda Coke PA-C

## 2022-12-14 ENCOUNTER — Other Ambulatory Visit: Payer: PPO

## 2022-12-14 ENCOUNTER — Other Ambulatory Visit: Payer: Self-pay

## 2022-12-14 DIAGNOSIS — E876 Hypokalemia: Secondary | ICD-10-CM | POA: Diagnosis present

## 2022-12-14 DIAGNOSIS — I429 Cardiomyopathy, unspecified: Secondary | ICD-10-CM | POA: Diagnosis present

## 2022-12-14 DIAGNOSIS — E785 Hyperlipidemia, unspecified: Secondary | ICD-10-CM

## 2022-12-14 DIAGNOSIS — I96 Gangrene, not elsewhere classified: Secondary | ICD-10-CM | POA: Diagnosis not present

## 2022-12-14 DIAGNOSIS — I70223 Atherosclerosis of native arteries of extremities with rest pain, bilateral legs: Secondary | ICD-10-CM | POA: Diagnosis present

## 2022-12-14 DIAGNOSIS — E1142 Type 2 diabetes mellitus with diabetic polyneuropathy: Secondary | ICD-10-CM | POA: Diagnosis not present

## 2022-12-14 DIAGNOSIS — I5032 Chronic diastolic (congestive) heart failure: Secondary | ICD-10-CM | POA: Diagnosis not present

## 2022-12-14 DIAGNOSIS — Z8249 Family history of ischemic heart disease and other diseases of the circulatory system: Secondary | ICD-10-CM | POA: Diagnosis not present

## 2022-12-14 DIAGNOSIS — E11622 Type 2 diabetes mellitus with other skin ulcer: Secondary | ICD-10-CM | POA: Diagnosis present

## 2022-12-14 DIAGNOSIS — I70248 Atherosclerosis of native arteries of left leg with ulceration of other part of lower left leg: Secondary | ICD-10-CM | POA: Diagnosis present

## 2022-12-14 DIAGNOSIS — F17210 Nicotine dependence, cigarettes, uncomplicated: Secondary | ICD-10-CM | POA: Diagnosis present

## 2022-12-14 DIAGNOSIS — I998 Other disorder of circulatory system: Secondary | ICD-10-CM

## 2022-12-14 DIAGNOSIS — I10 Essential (primary) hypertension: Secondary | ICD-10-CM

## 2022-12-14 DIAGNOSIS — E1169 Type 2 diabetes mellitus with other specified complication: Secondary | ICD-10-CM | POA: Diagnosis present

## 2022-12-14 DIAGNOSIS — L97929 Non-pressure chronic ulcer of unspecified part of left lower leg with unspecified severity: Secondary | ICD-10-CM | POA: Diagnosis present

## 2022-12-14 DIAGNOSIS — J449 Chronic obstructive pulmonary disease, unspecified: Secondary | ICD-10-CM | POA: Diagnosis present

## 2022-12-14 DIAGNOSIS — I70263 Atherosclerosis of native arteries of extremities with gangrene, bilateral legs: Secondary | ICD-10-CM | POA: Diagnosis not present

## 2022-12-14 DIAGNOSIS — I5042 Chronic combined systolic (congestive) and diastolic (congestive) heart failure: Secondary | ICD-10-CM | POA: Diagnosis present

## 2022-12-14 DIAGNOSIS — I2510 Atherosclerotic heart disease of native coronary artery without angina pectoris: Secondary | ICD-10-CM | POA: Diagnosis present

## 2022-12-14 DIAGNOSIS — I110 Hypertensive heart disease with heart failure: Secondary | ICD-10-CM | POA: Diagnosis present

## 2022-12-14 DIAGNOSIS — E1152 Type 2 diabetes mellitus with diabetic peripheral angiopathy with gangrene: Secondary | ICD-10-CM | POA: Diagnosis present

## 2022-12-14 DIAGNOSIS — I771 Stricture of artery: Secondary | ICD-10-CM | POA: Diagnosis present

## 2022-12-14 DIAGNOSIS — Z7902 Long term (current) use of antithrombotics/antiplatelets: Secondary | ICD-10-CM | POA: Diagnosis not present

## 2022-12-14 DIAGNOSIS — Z95820 Peripheral vascular angioplasty status with implants and grafts: Secondary | ICD-10-CM | POA: Diagnosis not present

## 2022-12-14 DIAGNOSIS — Z7984 Long term (current) use of oral hypoglycemic drugs: Secondary | ICD-10-CM | POA: Diagnosis not present

## 2022-12-14 DIAGNOSIS — L03116 Cellulitis of left lower limb: Secondary | ICD-10-CM | POA: Diagnosis present

## 2022-12-14 DIAGNOSIS — K580 Irritable bowel syndrome with diarrhea: Secondary | ICD-10-CM | POA: Diagnosis present

## 2022-12-14 DIAGNOSIS — Y838 Other surgical procedures as the cause of abnormal reaction of the patient, or of later complication, without mention of misadventure at the time of the procedure: Secondary | ICD-10-CM | POA: Diagnosis present

## 2022-12-14 DIAGNOSIS — Z880 Allergy status to penicillin: Secondary | ICD-10-CM | POA: Diagnosis not present

## 2022-12-14 DIAGNOSIS — E871 Hypo-osmolality and hyponatremia: Secondary | ICD-10-CM | POA: Diagnosis present

## 2022-12-14 DIAGNOSIS — K219 Gastro-esophageal reflux disease without esophagitis: Secondary | ICD-10-CM | POA: Diagnosis present

## 2022-12-14 DIAGNOSIS — T82858A Stenosis of vascular prosthetic devices, implants and grafts, initial encounter: Secondary | ICD-10-CM | POA: Diagnosis present

## 2022-12-14 LAB — CBC
HCT: 34.2 % — ABNORMAL LOW (ref 36.0–46.0)
Hemoglobin: 10.1 g/dL — ABNORMAL LOW (ref 12.0–15.0)
MCH: 24.9 pg — ABNORMAL LOW (ref 26.0–34.0)
MCHC: 29.5 g/dL — ABNORMAL LOW (ref 30.0–36.0)
MCV: 84.2 fL (ref 80.0–100.0)
Platelets: 653 10*3/uL — ABNORMAL HIGH (ref 150–400)
RBC: 4.06 MIL/uL (ref 3.87–5.11)
RDW: 23.3 % — ABNORMAL HIGH (ref 11.5–15.5)
WBC: 14.5 10*3/uL — ABNORMAL HIGH (ref 4.0–10.5)
nRBC: 0 % (ref 0.0–0.2)

## 2022-12-14 LAB — CREATININE, SERUM
Creatinine, Ser: 0.74 mg/dL (ref 0.44–1.00)
GFR, Estimated: >60 mL/min (ref >60)

## 2022-12-14 MED ORDER — SODIUM CHLORIDE 0.9% FLUSH
3.0000 mL | Freq: Two times a day (BID) | INTRAVENOUS | Status: DC
  Administered 2022-12-14 – 2022-12-18 (×3): 3 mL via INTRAVENOUS

## 2022-12-14 MED ORDER — ONDANSETRON HCL 4 MG PO TABS: 4.0000 mg | ORAL_TABLET | Freq: Four times a day (QID) | ORAL | Status: DC | PRN

## 2022-12-14 MED ORDER — HYDRALAZINE HCL 20 MG/ML IJ SOLN
10.0000 mg | Freq: Four times a day (QID) | INTRAMUSCULAR | Status: DC | PRN
  Administered 2022-12-18: 10 mg via INTRAVENOUS
  Filled 2022-12-14: qty 1

## 2022-12-14 MED ORDER — ONDANSETRON HCL 4 MG/2ML IJ SOLN
4.0000 mg | Freq: Four times a day (QID) | INTRAMUSCULAR | Status: DC | PRN
  Administered 2022-12-14: 4 mg via INTRAVENOUS
  Filled 2022-12-14: qty 2

## 2022-12-14 MED ORDER — SODIUM CHLORIDE 0.9 % IV SOLN: 250.0000 mL | INTRAVENOUS | Status: DC | PRN

## 2022-12-14 MED ORDER — VITAMIN D 25 MCG (1000 UNIT) PO TABS
2000.0000 [IU] | ORAL_TABLET | Freq: Every day | ORAL | Status: DC
  Administered 2022-12-14 – 2022-12-19 (×5): 2000 [IU] via ORAL
  Filled 2022-12-14 (×5): qty 2

## 2022-12-14 MED ORDER — SACUBITRIL-VALSARTAN 49-51 MG PO TABS
1.0000 | ORAL_TABLET | Freq: Two times a day (BID) | ORAL | Status: DC
  Administered 2022-12-14 – 2022-12-19 (×10): 1 via ORAL
  Filled 2022-12-14 (×11): qty 1

## 2022-12-14 MED ORDER — ALPRAZOLAM 0.5 MG PO TABS: 0.5000 mg | ORAL_TABLET | Freq: Every day | ORAL | Status: DC | PRN

## 2022-12-14 MED ORDER — POLYETHYLENE GLYCOL 3350 17 G PO PACK
17.0000 g | PACK | Freq: Every day | ORAL | Status: DC
  Filled 2022-12-14 (×4): qty 1

## 2022-12-14 MED ORDER — ASPIRIN 81 MG PO CHEW
81.0000 mg | CHEWABLE_TABLET | Freq: Every morning | ORAL | Status: DC
  Administered 2022-12-14 – 2022-12-19 (×5): 81 mg via ORAL
  Filled 2022-12-14 (×5): qty 1

## 2022-12-14 MED ORDER — FENTANYL CITRATE PF 50 MCG/ML IJ SOSY
50.0000 ug | PREFILLED_SYRINGE | Freq: Once | INTRAMUSCULAR | Status: AC
  Administered 2022-12-14: 50 ug via INTRAVENOUS
  Filled 2022-12-14: qty 1

## 2022-12-14 MED ORDER — CYCLOBENZAPRINE HCL 10 MG PO TABS
5.0000 mg | ORAL_TABLET | Freq: Three times a day (TID) | ORAL | Status: DC | PRN
  Administered 2022-12-14 – 2022-12-15 (×2): 5 mg via ORAL
  Filled 2022-12-14 (×2): qty 1

## 2022-12-14 MED ORDER — CLOPIDOGREL BISULFATE 75 MG PO TABS
75.0000 mg | ORAL_TABLET | Freq: Every day | ORAL | Status: DC
  Administered 2022-12-14 – 2022-12-18 (×5): 75 mg via ORAL
  Filled 2022-12-14 (×5): qty 1

## 2022-12-14 MED ORDER — ATORVASTATIN CALCIUM 80 MG PO TABS
80.0000 mg | ORAL_TABLET | Freq: Every day | ORAL | Status: DC
  Administered 2022-12-14 – 2022-12-18 (×5): 80 mg via ORAL
  Filled 2022-12-14 (×5): qty 1

## 2022-12-14 MED ORDER — ALBUTEROL SULFATE (2.5 MG/3ML) 0.083% IN NEBU: 2.5000 mg | INHALATION_SOLUTION | RESPIRATORY_TRACT | Status: DC | PRN

## 2022-12-14 MED ORDER — ACETAMINOPHEN 325 MG PO TABS: 650.0000 mg | ORAL_TABLET | Freq: Four times a day (QID) | ORAL | Status: DC | PRN

## 2022-12-14 MED ORDER — FLUOXETINE HCL 20 MG PO CAPS
40.0000 mg | ORAL_CAPSULE | Freq: Every day | ORAL | Status: DC
  Administered 2022-12-14 – 2022-12-19 (×5): 40 mg via ORAL
  Filled 2022-12-14 (×5): qty 2

## 2022-12-14 MED ORDER — LACTATED RINGERS IV BOLUS
1000.0000 mL | Freq: Once | INTRAVENOUS | Status: AC
  Administered 2022-12-14: 1000 mL via INTRAVENOUS

## 2022-12-14 MED ORDER — OXYCODONE HCL 5 MG PO TABS
5.0000 mg | ORAL_TABLET | ORAL | Status: DC | PRN
  Administered 2022-12-14 – 2022-12-18 (×15): 5 mg via ORAL
  Filled 2022-12-14 (×14): qty 1

## 2022-12-14 MED ORDER — FENTANYL CITRATE PF 50 MCG/ML IJ SOSY
12.5000 ug | PREFILLED_SYRINGE | INTRAMUSCULAR | Status: DC | PRN
  Administered 2022-12-14: 50 ug via INTRAVENOUS
  Filled 2022-12-14: qty 1

## 2022-12-14 MED ORDER — GABAPENTIN 300 MG PO CAPS
300.0000 mg | ORAL_CAPSULE | Freq: Three times a day (TID) | ORAL | Status: DC
  Administered 2022-12-14 – 2022-12-19 (×14): 300 mg via ORAL
  Filled 2022-12-14 (×14): qty 1

## 2022-12-14 MED ORDER — ACETAMINOPHEN 650 MG RE SUPP: 650.0000 mg | Freq: Four times a day (QID) | RECTAL | Status: DC | PRN

## 2022-12-14 MED ORDER — CLINDAMYCIN PHOSPHATE 600 MG/50ML IV SOLN
600.0000 mg | Freq: Once | INTRAVENOUS | Status: AC
  Administered 2022-12-14: 600 mg via INTRAVENOUS
  Filled 2022-12-14: qty 50

## 2022-12-14 MED ORDER — SODIUM CHLORIDE 0.9% FLUSH: 3.0000 mL | INTRAVENOUS | Status: DC | PRN

## 2022-12-14 MED ORDER — ENOXAPARIN SODIUM 40 MG/0.4ML IJ SOSY
40.0000 mg | PREFILLED_SYRINGE | Freq: Every day | INTRAMUSCULAR | Status: DC
  Administered 2022-12-14 – 2022-12-17 (×3): 40 mg via SUBCUTANEOUS
  Filled 2022-12-14 (×4): qty 0.4

## 2022-12-14 MED ORDER — PANTOPRAZOLE SODIUM 40 MG PO TBEC
40.0000 mg | DELAYED_RELEASE_TABLET | Freq: Every day | ORAL | Status: DC
  Administered 2022-12-14 – 2022-12-18 (×5): 40 mg via ORAL
  Filled 2022-12-14 (×5): qty 1

## 2022-12-14 NOTE — ED Notes (Signed)
ED TO INPATIENT HANDOFF REPORT  ED Nurse Name and Phone #: Ronald Lobo Name/Age/Gender Tammy Mcpherson 67 y.o. female Room/Bed: 042C/042C  Code Status   Code Status: Full Code  Home/SNF/Other Home Patient oriented to: self, place, time, and situation Is this baseline? Yes   Triage Complete: Triage complete  Chief Complaint Lower limb ischemia [I99.8] Critical limb ischemia of left lower extremity with ulceration of lower leg ([place]) [I70.248]  Triage Note Pt sent by pcp for gangrene in left foot. Pt scheduled for procedure x2 in the past but unable to proceed because hemoglobin was too low per pt. Pt scheduled for angiogram on Friday but pcp stated that pt could not wait until then.    Allergies Allergies  Allergen Reactions   Bee Venom Anaphylaxis and Swelling   Codeine Anaphylaxis and Other (See Comments)    Tongue swelled   Penicillins Anaphylaxis, Swelling and Rash    Has patient had a PCN reaction causing immediate rash, facial/tongue/throat swelling, SOB or lightheadedness with hypotension: No, delayed Has patient had a PCN reaction causing severe rash involving mucus membranes or skin necrosis: No Has patient had a PCN reaction that required hospitalization No Has patient had a PCN reaction occurring within the last 10 years: No If all of the above answers are "NO", then may proceed with Cephalosporin use.    Bupropion Other (See Comments)    "Made my skin crawl"   Sudafed [Pseudoephedrine Hcl] Rash    Level of Care/Admitting Diagnosis ED Disposition     ED Disposition  Admit   Condition  --   [place]: [place] [100100]  Level of Care: Telemetry Medical [104]  May admit patient to [facility] or [facility] if equivalent level of care is available:: No  Covid Evaluation: Asymptomatic - no recent exposure (last 10 days) testing not required  Diagnosis: Critical limb ischemia of left lower extremity with ulceration of  lower leg [id number]  Admitting Physician: Jonetta Osgood [[id number]]  Attending Physician: Jonetta Osgood [[id number]]  Bed request comments: prefer [place] if possible  Certification:: I certify this patient will need inpatient services for at least 2 midnights  Estimated Length of Stay: 3          B Medical/Surgery History Past Medical History:  Diagnosis Date   Anemia 04/30/2016   Anxiety    Arthritis    Cardiomyopathy ([place])    a. EF 40-45% by echo in 04/2016 b. Improved to 60-65% by repeat imaging in 2018   COPD (chronic obstructive pulmonary disease) (HCC)    Coronary artery calcification seen on CT scan    Essential hypertension    GERD (gastroesophageal reflux disease)    Headache    History of bronchitis    History of GI bleed    History of hiatal hernia    Hyperlipidemia    Iron deficiency anemia    Peripheral vascular disease (HCC)    Pollen allergy    PSVT (paroxysmal supraventricular tachycardia)    PVC's (premature ventricular contractions)    Type 2 diabetes mellitus ([place])    Vitamin B12 deficiency    Past Surgical History:  Procedure Laterality Date   ABDOMINAL AORTOGRAM W/LOWER EXTREMITY Bilateral 05/19/2021   Procedure: ABDOMINAL AORTOGRAM W/LOWER EXTREMITY;  Surgeon: [name], MD;  Location: [facility];  Service: Cardiovascular;  Laterality: Bilateral;   ABDOMINAL HYSTERECTOMY     polyps  about age 10  Barbie Banner OSTEOTOMY Right 07/10/2013   Procedure: Barbie Banner OSTEOTOMY RIGHT FOOT;  Surgeon: Marcheta Grammes, DPM;  Location: [facility];  Service: Orthopedics;  Laterality: Right;   AMPUTATION Left 05/09/2017   Procedure: AMPUTATION 5TH TOE LEFT FOOT;  Surgeon: [name], DPM;  Location: [facility];  Service: Podiatry;  Laterality: Left;   AMPUTATION Left 06/13/2017   Procedure: AMPUTATION 2ND TOE LEFT FOOT;  Surgeon: [name], DPM;  Location: [facility];  Service: Podiatry;  Laterality: Left;   AMPUTATION Right 06/01/2021    Procedure: Right great toe and Second Toe Amputation;  Surgeon: [name], MD;  Location: [facility];  Service: Vascular;  Laterality: Right;   BIOPSY  10/27/2022   Procedure: BIOPSY;  Surgeon: Daneil Dolin, MD;  [facility];  Service: Endoscopy;;   BUNIONECTOMY Right 07/10/2013   Procedure: Maryfrances Bunnell RIGHT FOOT;  Surgeon: Marcheta Grammes, DPM;  Location: [facility];  Service: Orthopedics;  Laterality: Right;   CHOLECYSTECTOMY N/A 08/22/2017   Procedure: LAPAROSCOPIC CHOLECYSTECTOMY;  Surgeon: Virl Cagey, MD;  Location: [facility];  Service: General;  Laterality: N/A;   COLONOSCOPY N/A 07/07/2016   Dr. Oneida Alar; redundant left colon, diverticulosis at hepatic flexure, non-bleeding internal hemorrhoids   ENTEROSCOPY N/A 10/27/2022   Procedure: ENTEROSCOPY;  Surgeon: Daneil Dolin, MD;  [facility];  Service: Endoscopy;  Laterality: N/A;   ENTEROSCOPY N/A 12/09/2022   Procedure: ENTEROSCOPY;  Surgeon: Yetta Flock, MD;  Location: [facility];  Service: Gastroenterology;  Laterality: N/A;   ESOPHAGOGASTRODUODENOSCOPY N/A 07/07/2016   Dr. Oneida Alar: many non-bleeding cratered gastric ulcers without stigmata of bleeding in gastric antrum. four 2-3 mm angioectasias without bleeding in duodenal bulb and second portion of duodenum s/p APC. Chroni gastritis on path.    ESOPHAGOGASTRODUODENOSCOPY N/A 08/03/2017   Dr. Oneida Alar: erosive gastritis, AVMs. Found a single non-bleeding angioectasia in stomach, s/p APC therapy. Four non-bleeding angioectasias in duodenum s/p APC. Non-bleeding erosive gastropathy   HOT HEMOSTASIS  10/27/2022   Procedure: HOT HEMOSTASIS (ARGON PLASMA COAGULATION/BICAP);  Surgeon: Daneil Dolin, MD;  [facility];  Service: Endoscopy;;   HOT HEMOSTASIS N/A 12/09/2022   Procedure: HOT HEMOSTASIS (ARGON PLASMA COAGULATION/BICAP);  Surgeon: Yetta Flock, MD;  Location: [facility];  Service: Gastroenterology;  Laterality: N/A;    IR ANGIOGRAM EXTREMITY LEFT  04/24/2017   IR FEM POP ART ATHERECT INC PTA MOD SED  04/24/2017   IR INFUSION THROMBOL ARTERIAL INITIAL (MS)  04/24/2017   IR RADIOLOGIST EVAL & MGMT  12/05/2016   IR US GUIDE VASC ACCESS RIGHT  04/24/2017   LIVER BIOPSY N/A 08/22/2017   Procedure: LIVER BIOPSY;  Surgeon: Virl Cagey, MD;  [facility];  Service: General;  Laterality: N/A;   METATARSAL HEAD EXCISION Right 07/10/2013   Procedure: METATARSAL HEAD RESECTION OF DIGITS 2 AND 3 RIGHT FOOT;  Surgeon: Marcheta Grammes, DPM;  Location: [facility];  Service: Orthopedics;  Laterality: Right;   PERIPHERAL VASCULAR INTERVENTION Right 05/19/2021   Procedure: PERIPHERAL VASCULAR INTERVENTION;  Surgeon: [name], MD;  Location: [facility];  Service: Cardiovascular;  Laterality: Right;   PROXIMAL INTERPHALANGEAL FUSION (PIP) Right 07/10/2013   Procedure: ARTHRODESIS PIPJ  2ND DIGIT RIGHT FOOT;  Surgeon: Marcheta Grammes, DPM;  Location: [facility];  Service: Orthopedics;  Laterality: Right;   SUBMUCOSAL TATTOO INJECTION  12/09/2022   Procedure: SUBMUCOSAL TATTOO INJECTION;  Surgeon: Yetta Flock, MD;  Location: [facility];  Service: Gastroenterology;;  A IV Location/Drains/Wounds Patient Lines/Drains/Airways Status     Active Line/Drains/Airways     Name Placement date Placement time Site Days   Peripheral IV 12/14/22 20 G Anterior;Right Hand 12/14/22  0348  Hand  less than 1            Intake/Output Last 24 hours  Intake/Output Summary (Last 24 hours) at 12/14/2022 1442 Last data filed at 12/14/2022 0631 Gross per 24 hour  Intake 1049 ml  Output --  Net 1049 ml    Labs/Imaging Results for orders placed or performed during the hospital encounter of 12/13/22 (from the past 48 hour(s))  CBC with Differential     Status: Abnormal   Collection Time: 12/13/22  5:55 PM  Result Value Ref Range   WBC 14.4 (H) 4.0 - 10.5 K/uL   RBC 4.42 3.87 - 5.11 MIL/uL    Hemoglobin 10.6 (L) 12.0 - 15.0 g/dL   HCT 36.7 36.0 - 46.0 %   MCV 83.0 80.0 - 100.0 fL   MCH 24.0 (L) 26.0 - 34.0 pg   MCHC 28.9 (L) 30.0 - 36.0 g/dL   RDW 23.1 (H) 11.5 - 15.5 %   Platelets 803 (H) 150 - 400 K/uL   nRBC 0.0 0.0 - 0.2 %   Neutrophils Relative % 77 %   Neutro Abs 11.2 (H) 1.7 - 7.7 K/uL   Lymphocytes Relative 9 %   Lymphs Abs 1.3 0.7 - 4.0 K/uL   Monocytes Relative 11 %   Monocytes Absolute 1.6 (H) 0.1 - 1.0 K/uL   Eosinophils Relative 1 %   Eosinophils Absolute 0.1 0.0 - 0.5 K/uL   Basophils Relative 0 %   Basophils Absolute 0.1 0.0 - 0.1 K/uL   Immature Granulocytes 2 %   Abs Immature Granulocytes 0.23 (H) 0.00 - 0.07 K/uL    Comment: Performed at [facility], [address], [place], [place] [address]  Comprehensive metabolic panel     Status: Abnormal   Collection Time: 12/13/22  5:55 PM  Result Value Ref Range   Sodium 137 135 - 145 mmol/L   Potassium 3.1 (L) 3.5 - 5.1 mmol/L   Chloride 101 98 - 111 mmol/L   CO2 24 22 - 32 mmol/L   Glucose, Bld 153 (H) 70 - 99 mg/dL    Comment: Glucose reference range applies only to samples taken after fasting for at least 8 hours.   BUN 11 8 - 23 mg/dL   Creatinine, Ser 1.11 (H) 0.44 - 1.00 mg/dL   Calcium 9.6 8.9 - 10.3 mg/dL   Total Protein 7.4 6.5 - 8.1 g/dL   Albumin 2.8 (L) 3.5 - 5.0 g/dL   AST 12 (L) 15 - 41 U/L   ALT 10 0 - 44 U/L   Alkaline Phosphatase 125 38 - 126 U/L   Total Bilirubin 0.2 (L) 0.3 - 1.2 mg/dL   GFR, Estimated 55 (L) >60 mL/min    Comment: (NOTE) Calculated using the CKD-EPI Creatinine Equation (2021)    Anion gap 12 5 - 15    Comment: Performed at [facility], [place] [address], [place], [place] [address]  Lactic acid, plasma     Status: None   Collection Time: 12/13/22  5:58 PM  Result Value Ref Range   Lactic Acid, Venous 1.6 0.5 - 1.9 mmol/L    Comment: Performed at [facility] [address], [place], [place] [address]  Blood culture (routine x 2)  Status:  None (Preliminary result)   Collection Time: 12/13/22  5:58 PM   Specimen: BLOOD LEFT FOREARM  Result Value Ref Range   Specimen Description BLOOD LEFT FOREARM    Special Requests      BOTTLES DRAWN AEROBIC AND ANAEROBIC Blood Culture results may not be optimal due to an excessive volume of blood received in culture bottles   Culture      NO GROWTH < 24 HOURS Performed at [facility] [address], [place], [place] [address]    Report Status PENDING   CBC     Status: Abnormal   Collection Time: 12/14/22  8:16 AM  Result Value Ref Range   WBC 14.5 (H) 4.0 - 10.5 K/uL   RBC 4.06 3.87 - 5.11 MIL/uL   Hemoglobin 10.1 (L) 12.0 - 15.0 g/dL   HCT 34.2 (L) 36.0 - 46.0 %   MCV 84.2 80.0 - 100.0 fL   MCH 24.9 (L) 26.0 - 34.0 pg   MCHC 29.5 (L) 30.0 - 36.0 g/dL   RDW 23.3 (H) 11.5 - 15.5 %   Platelets 653 (H) 150 - 400 K/uL   nRBC 0.0 0.0 - 0.2 %    Comment: Performed at [facility], [place] [address], [place], [place] [address]  Creatinine, serum     Status: None   Collection Time: 12/14/22  8:16 AM  Result Value Ref Range   Creatinine, Ser 0.74 0.44 - 1.00 mg/dL   GFR, Estimated >60 >60 mL/min    Comment: (NOTE) Calculated using the CKD-EPI Creatinine Equation (2021) Performed at [facility] [address] [place], [place] [address]    DG Foot Complete Left  Result Date: 12/13/2022 CLINICAL DATA:  Dry gangrene. EXAM: LEFT FOOT - COMPLETE 3+ VIEW COMPARISON:  Foot x-rays 11/06/2022 FINDINGS: Patient is status post second and fifth toe amputation the level of the MTP joints. No evidence for an acute fracture or dislocation. No bony lysis to suggest osteomyelitis. IMPRESSION: Stable.  No new or progressive interval findings. Electronically Signed   By: Misty Stanley M.D.   On: 12/13/2022 19:03    Pending Labs Unresulted Labs (From admission, onward)     Start     Ordered   12/21/22 0500  Creatinine, serum  (enoxaparin (LOVENOX)    CrCl >/= 30 ml/min)  Weekly,    R     Comments: while on enoxaparin therapy    12/14/22 0743   12/15/22 0500  CBC  Tomorrow morning,   R        12/14/22 0743   12/15/22 [id number]  Basic metabolic panel  Tomorrow morning,   R        12/14/22 0743   12/13/22 1732  Blood culture (routine x 2)  BLOOD CULTURE X 2,   R (with STAT occurrences)      12/13/22 1731            Vitals/Pain Today's Vitals   12/14/22 1245 12/14/22 1300 12/14/22 1315 12/14/22 1426  BP: (!) 167/65 (!) 163/64 (!) 158/79 (!) 165/64  Pulse: 95 98 (!) 102 97  Resp: 17 (!) '22 19 16  '$ Temp:    98.2 F (36.8 C)  TempSrc:    Oral  SpO2: 95% 93% 94% 95%  Weight:      Height:      PainSc:        Isolation Precautions No active isolations  Medications Medications  aspirin chewable tablet 81 mg (81  mg Oral Given 12/14/22 0812)  atorvastatin (LIPITOR) tablet 80 mg (has no administration in time range)  sacubitril-valsartan (ENTRESTO) 49-51 mg per tablet (1 tablet Oral Given 12/14/22 0812)  ALPRAZolam Duanne Moron) tablet 0.5 mg (has no administration in time range)  FLUoxetine (PROZAC) capsule 40 mg (40 mg Oral Given 12/14/22 0812)  pantoprazole (PROTONIX) EC tablet 40 mg (has no administration in time range)  clopidogrel (PLAVIX) tablet 75 mg (has no administration in time range)  cyclobenzaprine (FLEXERIL) tablet 5 mg (has no administration in time range)  gabapentin (NEURONTIN) capsule 300 mg (300 mg Oral Given 12/14/22 0812)  cholecalciferol (VITAMIN D3) 25 MCG (1000 UNIT) tablet 2,000 Units (2,000 Units Oral Given 12/14/22 0813)  enoxaparin (LOVENOX) injection 40 mg (40 mg Subcutaneous Given 12/14/22 0813)  sodium chloride flush (NS) 0.9 % injection 3 mL (3 mLs Intravenous Given 12/14/22 0813)  sodium chloride flush (NS) 0.9 % injection 3 mL (has no administration in time range)  0.9 %  sodium chloride infusion (has no administration in time range)  acetaminophen (TYLENOL) tablet 650 mg (has no administration in time range)    Or  acetaminophen (TYLENOL)  suppository 650 mg (has no administration in time range)  oxyCODONE (Oxy IR/ROXICODONE) immediate release tablet 5 mg (5 mg Oral Given 12/14/22 1136)  fentaNYL (SUBLIMAZE) injection 12.5-50 mcg (50 mcg Intravenous Given 12/14/22 0801)  polyethylene glycol (MIRALAX / GLYCOLAX) packet 17 g (17 g Oral Patient Refused/Not Given 12/14/22 0813)  ondansetron (ZOFRAN) tablet 4 mg ( Oral See Alternative 12/14/22 0801)    Or  ondansetron (ZOFRAN) injection 4 mg (4 mg Intravenous Given 12/14/22 0801)  hydrALAZINE (APRESOLINE) injection 10 mg (has no administration in time range)  albuterol (PROVENTIL) (2.5 MG/3ML) 0.083% nebulizer solution 2.5 mg (has no administration in time range)  oxyCODONE (Oxy IR/ROXICODONE) immediate release tablet 10 mg (10 mg Oral Given 12/13/22 2324)  lactated ringers bolus 1,000 mL (0 mLs Intravenous Stopped 12/14/22 0631)  clindamycin (CLEOCIN) IVPB 600 mg (0 mg Intravenous Stopped 12/14/22 0443)  fentaNYL (SUBLIMAZE) injection 50 mcg (50 mcg Intravenous Given 12/14/22 0355)    Mobility manual wheelchair     Focused Assessments Cardiac Assessment Handoff:    Lab Results  Component Value Date   TROPONINI <0.03 05/01/2016   Lab Results  Component Value Date   DDIMER <0.27 08/15/2013   Does the Patient currently have chest pain? No   , Neuro Assessment Handoff:  Swallow screen pass?  Not indicated         Neuro Assessment:   Neuro Checks:      Has TPA been given? No  If patient is a Neuro Trauma and patient is going to OR before floor call report to [place] nurse: [phone] or [phone]   R Recommendations: See Admitting Provider Note  Report given to:   Additional Notes: Patient NPO after MN for vascular study.  Consent is signed

## 2022-12-14 NOTE — ED Provider Notes (Signed)
[facility] Provider Note   CSN: [id number] Arrival date & time: 12/13/22  1617     History  No chief complaint on file.   Tammy Mcpherson is a 67 y.o. female.  Patient presents to the emergency department after being evaluated by her primary care doctor and sent to the ED.  Patient was recently hospitalized for severe anemia secondary to GI blood loss.  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  This workup was precipitated by preparation for abdominal aortogram secondary to gangrene of her left foot.  Patient was discharged 3 days ago.  She saw her primary care doctor for follow-up today and was referred back to the ER because primary care doctor thought that her foot looked infected.  Patient reports increased pain and inability to bear any weight, all of which have started since her discharge.  Patient also reports redness and swelling of the foot that was not present previously.       Home Medications Prior to Admission medications   Medication Sig Start Date End Date Taking? Authorizing Provider  Acetaminophen 500 MG capsule Take 500-1,000 mg by mouth every 6 (six) hours as needed for pain (or headaches).    [provider]  albuterol (PROVENTIL) (2.5 MG/3ML) 0.083% nebulizer solution Take 3 mLs (2.5 mg total) by nebulization every 4 (four) hours as needed for wheezing or shortness of breath. 04/26/22   [name], NP  albuterol (VENTOLIN HFA) 108 (90 Base) MCG/ACT inhaler INHALE TWO PUFFS INTO THE LUNGS EVERY SIX HOURS AS NEEDED FOR WHEEZING OR SHORTNESS OF BREATH Patient taking differently: Inhale 2 puffs into the lungs every 6 (six) hours as needed for wheezing or shortness of breath. 04/26/22   [name], NP  ALPRAZolam Duanne Moron) 0.5 MG tablet TAKE ONE TABLET (0.'5MG'$  TOTAL) BY MOUTH DAILY AS NEEDED FOR ANXIETY Patient taking differently: Take 0.5 mg by mouth daily as needed  for anxiety. 04/25/22   [name], NP  aspirin 81 MG chewable tablet Chew 1 tablet (81 mg total) by mouth every morning. 10/30/22   Manuella Ghazi, Pratik D, DO  atorvastatin (LIPITOR) 80 MG tablet Take 1 tablet (80 mg total) by mouth daily. Patient taking differently: Take 80 mg by mouth at bedtime. 06/07/22   Satira Sark, MD  blood glucose meter kit and supplies Dispense based on patient and insurance preference. Use up to four times daily as directed. (FOR ICD-10 E10.9, E11.9). 11/22/20   [name], NP  Cholecalciferol (VITAMIN D) 50 MCG (2000 UT) CAPS Take 1 capsule (2,000 Units total) by mouth daily. 03/15/22   Derek Jack, MD  clopidogrel (PLAVIX) 75 MG tablet Take 1 tablet (75 mg total) by mouth daily. TAKE ONE TABLET ('75MG'$  TOTAL) BY MOUTH DAILY WITH BREAKFAST Patient taking differently: Take 75 mg by mouth at bedtime. 10/30/22   Manuella Ghazi, Pratik D, DO  Continuous Blood Gluc Receiver (FREESTYLE LIBRE 2 READER) DEVI Check blood glucose up to 4 times daily 10/11/22   Inda Coke, PA  Continuous Blood Gluc Sensor (FREESTYLE LIBRE 2 SENSOR) MISC Apply one sensor to the upper arm avery 14 days. Patient taking differently: Inject 1 patch into the skin See admin instructions. Apply one sensor into the upper arm every 14 days 04/26/22   [name], NP  cyanocobalamin (VITAMIN B12) 1000 MCG/ML injection INJECT 1 ML INTO THE MUSCLE EVERY 30 DAYS. Patient taking differently: Inject 1,000 mcg into the skin every  30 (thirty) days. 08/09/22   Derek Jack, MD  cyclobenzaprine (FLEXERIL) 5 MG tablet Take 1 tablet (5 mg total) by mouth 3 (three) times daily as needed for muscle spasms. 04/25/22   [name], NP  FLUoxetine (PROZAC) 40 MG capsule Take 1 capsule (40 mg total) by mouth daily. 04/26/22   [name], NP  gabapentin (NEURONTIN) 300 MG capsule TAKE ONE CAPSULE (300 MG TOTAL) BY MOUTHTWO TIMES DAILY. Patient taking differently: Take 300 mg by mouth 3 (three) times daily.  04/26/22   [name], NP  glucose blood [facility] ULTRA) test strip Use as instructed 02/26/19   Letta Median K, DO  glucose monitoring kit (FREESTYLE) monitoring kit Use to check blood sugar BID as directed 01/24/19   Cirigliano, Garvin Fila, DO  iron polysaccharides (NIFEREX) 150 MG capsule Take 1 capsule (150 mg total) by mouth daily. 12/10/22 01/09/23  Antonieta Pert, MD  Lancets (ONETOUCH DELICA PLUS [id number]) MISC 1 each by Does not apply route 2 (two) times a day. 02/26/19   Cirigliano, Garvin Fila, DO  metFORMIN (GLUCOPHAGE-XR) 500 MG 24 hr tablet Take 1 tablet (500 mg total) by mouth daily with breakfast. Patient taking differently: Take 500 mg by mouth daily as needed (for elevated BGL). 10/25/22   Inda Coke, [place]. Devices MISC Please provide supplies (needle, syringe, alcohol swabs) needed for patient to self-administer B-12 injections monthly. 01/07/20   Derek Jack, MD  pantoprazole (PROTONIX) 40 MG tablet TAKE ONE (1) TABLET 30 MINS BEFORE YOUR FIRST MEAL. Patient taking differently: Take 40 mg by mouth at bedtime. 04/26/22   [name], NP  sacubitril-valsartan (ENTRESTO) 49-51 MG Take 1 tablet by mouth 2 (two) times daily. Patient taking differently: Take 1 tablet by mouth in the morning and at bedtime. 06/07/22   Satira Sark, MD  traMADol (ULTRAM) 50 MG tablet Take 1 tablet (50 mg total) by mouth every 12 (twelve) hours as needed. 12/13/22   Inda Coke, PA  Tuberculin-Allergy Syringes Flossie Buffy TUBERCULIN SAFETY SYR) 25G X 1" 1 ML MISC USE TO ADMINISTER INJECTABLE VITAMIN B12. 08/09/22   Derek Jack, MD      Allergies    Bee venom, Codeine, Penicillins, Bupropion, and Sudafed [pseudoephedrine hcl]    Review of Systems   Review of Systems  Physical Exam Updated Vital Signs BP (!) 164/63   Pulse (!) 101   Temp 98.3 F (36.8 C)   Resp 18   Ht '5\' 3"'$  (1.6 m)   Wt 61.2 kg   SpO2 96%   BMI 23.91 kg/m  Physical Exam Vitals and nursing note  reviewed.  Constitutional:      General: She is not in acute distress.    Appearance: She is well-developed.  HENT:     Head: Normocephalic and atraumatic.     Mouth/Throat:     Mouth: Mucous membranes are moist.  Eyes:     General: Vision grossly intact. Gaze aligned appropriately.     Extraocular Movements: Extraocular movements intact.     Conjunctiva/sclera: Conjunctivae normal.  Cardiovascular:     Rate and Rhythm: Normal rate and regular rhythm.     Pulses: Normal pulses.     Heart sounds: Normal heart sounds, S1 normal and S2 normal. No murmur heard.    No friction rub. No gallop.  Pulmonary:     Effort: Pulmonary effort is normal. No respiratory distress.     Breath sounds: Normal breath sounds.  Abdominal:     General: Bowel sounds are  normal.     Palpations: Abdomen is soft.     Tenderness: There is no abdominal tenderness. There is no guarding or rebound.     Hernia: No hernia is present.  Musculoskeletal:        General: No swelling.     Cervical back: Full passive range of motion without pain, normal range of motion and neck supple. No spinous process tenderness or muscular tenderness. Normal range of motion.     Right lower leg: No edema.     Left lower leg: No edema.  Skin:    General: Skin is warm and dry.     Capillary Refill: Capillary refill takes less than 2 seconds.     Findings: No ecchymosis, erythema, rash or wound.     Comments: Dry gangrene of first and third toes on left foot with prior amputation of second toe.  Open wound between first and third toe with some drainage.  Erythema, swelling, warmth across the foot to lower leg  Neurological:     General: No focal deficit present.     Mental Status: She is alert and oriented to person, place, and time.     GCS: GCS eye subscore is 4. GCS verbal subscore is 5. GCS motor subscore is 6.     Cranial Nerves: Cranial nerves 2-12 are intact.     Sensory: Sensation is intact.     Motor: Motor function is  intact.     Coordination: Coordination is intact.  Psychiatric:        Attention and Perception: Attention normal.        Mood and Affect: Mood normal.        Speech: Speech normal.        Behavior: Behavior normal.     ED Results / Procedures / Treatments   Labs (all labs ordered are listed, but only abnormal results are displayed) Labs Reviewed  CBC WITH DIFFERENTIAL/PLATELET - Abnormal; Notable for the following components:      Result Value   WBC 14.4 (*)    Hemoglobin 10.6 (*)    MCH 24.0 (*)    MCHC 28.9 (*)    RDW 23.1 (*)    Platelets 803 (*)    Neutro Abs 11.2 (*)    Monocytes Absolute 1.6 (*)    Abs Immature Granulocytes 0.23 (*)    All other components within normal limits  COMPREHENSIVE METABOLIC PANEL - Abnormal; Notable for the following components:   Potassium 3.1 (*)    Glucose, Bld 153 (*)    Creatinine, Ser 1.11 (*)    Albumin 2.8 (*)    AST 12 (*)    Total Bilirubin 0.2 (*)    GFR, Estimated 55 (*)    All other components within normal limits  CULTURE, BLOOD (ROUTINE X 2)  CULTURE, BLOOD (ROUTINE X 2)  LACTIC ACID, PLASMA  LACTIC ACID, PLASMA    EKG None  Radiology DG Foot Complete Left  Result Date: 12/13/2022 CLINICAL DATA:  Dry gangrene. EXAM: LEFT FOOT - COMPLETE 3+ VIEW COMPARISON:  Foot x-rays 11/06/2022 FINDINGS: Patient is status post second and fifth toe amputation the level of the MTP joints. No evidence for an acute fracture or dislocation. No bony lysis to suggest osteomyelitis. IMPRESSION: Stable.  No new or progressive interval findings. Electronically Signed   By: Misty Stanley M.D.   On: 12/13/2022 19:03    Procedures Procedures    Medications Ordered in ED Medications  lactated ringers bolus 1,000  mL (has no administration in time range)  clindamycin (CLEOCIN) IVPB 600 mg (has no administration in time range)  oxyCODONE (Oxy IR/ROXICODONE) immediate release tablet 10 mg (10 mg Oral Given 12/13/22 2324)    ED Course/  Medical Decision Making/ A&P                             Medical Decision Making Amount and/or Complexity of Data Reviewed External Data Reviewed: labs, radiology and notes. Labs: ordered. Decision-making details documented in ED Course. Radiology: ordered and independent interpretation performed. Decision-making details documented in ED Course.  Risk Decision regarding hospitalization.   Differential Diagnosis considered includes, but not limited to: CHF; venous stasis; DVT; cellulitis  Patient presents with pain, swelling, redness of the left foot and lower leg.  Patient with known critical limb ischemia, was being worked up for this when she was found to be severely anemic.  Patient hospitalized for anemia, blood loss found to be secondary to intestinal AVMs which were treated by endoscopy.  Patient is scheduled for aortogram tomorrow.  She saw her doctor today for follow-up from previous hospitalization and was sent to the ED with concerns over limb infection.  Patient indicates that she has increased swelling, redness and pain in the foot since being discharged.  She was previously able to bear some weight on the foot but now cannot place any weight on it because of the increased pain.  Examination does reveal some erythema, swelling across the foot.  I did not see the limb when she was hospitalized before but she indicates that this is new.  She is tachycardic but afebrile.  She has a leukocytosis.  Must consider superinfection of the foot secondary to dry gangrene with an open wound between her toes.  Initiate antibiotic coverage and admit to hospital.  Patient with reported history of anaphylactic reaction to penicillins.  Administered IV clindamycin.         Final Clinical Impression(s) / ED Diagnoses Final diagnoses:  Gangrene of left foot ([place])  Cellulitis of left lower extremity    Rx / DC Orders ED Discharge Orders     None         Talyn Eddie, Gwenyth Allegra,  MD 12/14/22 [phone]

## 2022-12-14 NOTE — Consult Note (Addendum)
Hospital Consult    Reason for Consult:  LLE ischemia Requesting Physician:  ED MRN #:  [id number]  History of Present Illness: Tammy Mcpherson is a 67 y.o. female with a PMH of HTN, DM2, COPD, GI bleeding from AVMs, and PAD. She presented to the hospital yesterday with worsening gangrenous changes to her left foot and continued rest pain. She recently was to undergo angiogram on 3/8 for gangrenous changes to bilateral feet but this was postponed due to severe anemia in the setting of GI bleeding. She underwent enteroscopy with ablation of multiple erosions in her duodenum and jejunum on 3/9, however there was no overt bleeding found. She was cleared to resume her Plavix and was discharged home on 3/10.   She returned to the ED yesterday with worsening gangrenous changes to the left toes and rest pain. She states over the last few days the pain in her left foot has gotten worse and her toes have darkened more. She denies any issues with motor or sensation of the foot, however it hurts to move it. She denies any fevers or nausea. She denies any pain in her right foot.  The pt is on a statin for cholesterol management.  The pt is on a daily aspirin.   Other AC:  plavix The pt is diabetic.    Past Medical History:  Diagnosis Date   Anemia 04/30/2016   Anxiety    Arthritis    Cardiomyopathy ([place])    a. EF 40-45% by echo in 04/2016 b. Improved to 60-65% by repeat imaging in 2018   COPD (chronic obstructive pulmonary disease) (HCC)    Coronary artery calcification seen on CT scan    Essential hypertension    GERD (gastroesophageal reflux disease)    Headache    History of bronchitis    History of GI bleed    History of hiatal hernia    Hyperlipidemia    Iron deficiency anemia    Peripheral vascular disease (HCC)    Pollen allergy    PSVT (paroxysmal supraventricular tachycardia)    PVC's (premature ventricular contractions)    Type 2 diabetes mellitus ([place])    Vitamin B12 deficiency      Past Surgical History:  Procedure Laterality Date   ABDOMINAL AORTOGRAM W/LOWER EXTREMITY Bilateral 05/19/2021   Procedure: ABDOMINAL AORTOGRAM W/LOWER EXTREMITY;  Surgeon: [name], MD;  Location: [place] [facility];  Service: Cardiovascular;  Laterality: Bilateral;   ABDOMINAL HYSTERECTOMY     polyps  about age 35   AIKEN OSTEOTOMY Right 07/10/2013   Procedure: Barbie Banner OSTEOTOMY RIGHT FOOT;  Surgeon: Marcheta Grammes, DPM;  Location: [facility];  Service: Orthopedics;  Laterality: Right;   AMPUTATION Left 05/09/2017   Procedure: AMPUTATION 5TH TOE LEFT FOOT;  Surgeon: [name], DPM;  Location: [facility];  Service: Podiatry;  Laterality: Left;   AMPUTATION Left 06/13/2017   Procedure: AMPUTATION 2ND TOE LEFT FOOT;  Surgeon: [name], DPM;  Location: [facility];  Service: Podiatry;  Laterality: Left;   AMPUTATION Right 06/01/2021   Procedure: Right great toe and Second Toe Amputation;  Surgeon: [name], MD;  Location: [place];  Service: Vascular;  Laterality: Right;   BIOPSY  10/27/2022   Procedure: BIOPSY;  Surgeon: Daneil Dolin, MD;  [facility];  Service: Endoscopy;;   BUNIONECTOMY Right 07/10/2013   Procedure: Maryfrances Bunnell RIGHT FOOT;  Surgeon: Marcheta Grammes, DPM;  Location: [facility];  Service: Orthopedics;  Laterality: Right;  CHOLECYSTECTOMY N/A 08/22/2017   Procedure: LAPAROSCOPIC CHOLECYSTECTOMY;  Surgeon: Virl Cagey, MD;  Location: [facility];  Service: General;  Laterality: N/A;   COLONOSCOPY N/A 07/07/2016   Dr. Oneida Alar; redundant left colon, diverticulosis at hepatic flexure, non-bleeding internal hemorrhoids   ENTEROSCOPY N/A 10/27/2022   Procedure: ENTEROSCOPY;  Surgeon: Daneil Dolin, MD;  [facility];  Service: Endoscopy;  Laterality: N/A;   ENTEROSCOPY N/A 12/09/2022   Procedure: ENTEROSCOPY;  Surgeon: Yetta Flock, MD;  Location: [facility];  Service: Gastroenterology;  Laterality: N/A;    ESOPHAGOGASTRODUODENOSCOPY N/A 07/07/2016   Dr. Oneida Alar: many non-bleeding cratered gastric ulcers without stigmata of bleeding in gastric antrum. four 2-3 mm angioectasias without bleeding in duodenal bulb and second portion of duodenum s/p APC. Chroni gastritis on path.    ESOPHAGOGASTRODUODENOSCOPY N/A 08/03/2017   Dr. Oneida Alar: erosive gastritis, AVMs. Found a single non-bleeding angioectasia in stomach, s/p APC therapy. Four non-bleeding angioectasias in duodenum s/p APC. Non-bleeding erosive gastropathy   HOT HEMOSTASIS  10/27/2022   Procedure: HOT HEMOSTASIS (ARGON PLASMA COAGULATION/BICAP);  Surgeon: Daneil Dolin, MD;  [facility];  Service: Endoscopy;;   HOT HEMOSTASIS N/A 12/09/2022   Procedure: HOT HEMOSTASIS (ARGON PLASMA COAGULATION/BICAP);  Surgeon: Yetta Flock, MD;  Location[facility];  Service: Gastroenterology;  Laterality: N/A;   IR ANGIOGRAM EXTREMITY LEFT  04/24/2017   IR FEM POP ART ATHERECT INC PTA MOD SED  04/24/2017   IR INFUSION THROMBOL ARTERIAL INITIAL (MS)  04/24/2017   IR RADIOLOGIST EVAL & MGMT  12/05/2016   IR US GUIDE VASC ACCESS RIGHT  04/24/2017   LIVER BIOPSY N/A 08/22/2017   Procedure: LIVER BIOPSY;  Surgeon: Virl Cagey, MD;  [facility]: [facility];  Service: General;  Laterality: N/A;   METATARSAL HEAD EXCISION Right 07/10/2013   Procedure: METATARSAL HEAD RESECTION OF DIGITS 2 AND 3 RIGHT FOOT;  Surgeon: Marcheta Grammes, DPM;  Location: [facility];  Service: Orthopedics;  Laterality: Right;   PERIPHERAL VASCULAR INTERVENTION Right 05/19/2021   Procedure: PERIPHERAL VASCULAR INTERVENTION;  Surgeon: [name], MD;  Location: [place] [facility];  Service: Cardiovascular;  Laterality: Right;   PROXIMAL INTERPHALANGEAL FUSION (PIP) Right 07/10/2013   Procedure: ARTHRODESIS PIPJ  2ND DIGIT RIGHT FOOT;  Surgeon: Marcheta Grammes, DPM;  Location: [facility];  Service: Orthopedics;  Laterality: Right;   SUBMUCOSAL TATTOO INJECTION  12/09/2022    Procedure: SUBMUCOSAL TATTOO INJECTION;  Surgeon: Yetta Flock, MD;  Location: [facility];  Service: Gastroenterology;;    Allergies  Allergen Reactions   Bee Venom Anaphylaxis and Swelling   Codeine Anaphylaxis and Other (See Comments)    Tongue swelled   Penicillins Anaphylaxis, Swelling and Rash    Has patient had a PCN reaction causing immediate rash, facial/tongue/throat swelling, SOB or lightheadedness with hypotension: No, delayed Has patient had a PCN reaction causing severe rash involving mucus membranes or skin necrosis: No Has patient had a PCN reaction that required hospitalization No Has patient had a PCN reaction occurring within the last 10 years: No If all of the above answers are "NO", then may proceed with Cephalosporin use.    Bupropion Other (See Comments)    "Made my skin crawl"   Sudafed [Pseudoephedrine Hcl] Rash    Prior to Admission medications   Medication Sig Start Date End Date Taking? Authorizing Provider  Acetaminophen 500 MG capsule Take 500-1,000 mg by mouth every 6 (six) hours as needed for pain (or headaches).  Yes [provider]  albuterol (PROVENTIL) (2.5 MG/3ML) 0.083% nebulizer solution Take 3 mLs (2.5 mg total) by nebulization every 4 (four) hours as needed for wheezing or shortness of breath. 04/26/22  Yes [name], NP  albuterol (VENTOLIN HFA) 108 (90 Base) MCG/ACT inhaler INHALE TWO PUFFS INTO THE LUNGS EVERY SIX HOURS AS NEEDED FOR WHEEZING OR SHORTNESS OF BREATH Patient taking differently: Inhale 2 puffs into the lungs every 6 (six) hours as needed for wheezing or shortness of breath. 04/26/22  Yes [name], NP  ALPRAZolam Duanne Moron) 0.5 MG tablet TAKE ONE TABLET (0.'5MG'$  TOTAL) BY MOUTH DAILY AS NEEDED FOR ANXIETY Patient taking differently: Take 0.5 mg by mouth daily as needed for anxiety. 04/25/22  Yes [name], NP  aspirin 81 MG chewable tablet Chew 1 tablet (81 mg total) by mouth every morning. 10/30/22   Yes Shah, Pratik D, DO  atorvastatin (LIPITOR) 80 MG tablet Take 1 tablet (80 mg total) by mouth daily. Patient taking differently: Take 80 mg by mouth at bedtime. 06/07/22  Yes Satira Sark, MD  Cholecalciferol (VITAMIN D) 50 MCG (2000 UT) CAPS Take 1 capsule (2,000 Units total) by mouth daily. 03/15/22  Yes Derek Jack, MD  clopidogrel (PLAVIX) 75 MG tablet Take 1 tablet (75 mg total) by mouth daily. TAKE ONE TABLET ('75MG'$  TOTAL) BY MOUTH DAILY WITH BREAKFAST Patient taking differently: Take 75 mg by mouth at bedtime. 10/30/22  Yes Shah, Pratik D, DO  cyanocobalamin (VITAMIN B12) 1000 MCG/ML injection INJECT 1 ML INTO THE MUSCLE EVERY 30 DAYS. Patient taking differently: Inject 1,000 mcg into the skin every 30 (thirty) days. 08/09/22  Yes Derek Jack, MD  cyclobenzaprine (FLEXERIL) 5 MG tablet Take 1 tablet (5 mg total) by mouth 3 (three) times daily as needed for muscle spasms. 04/25/22  Yes [name], NP  FLUoxetine (PROZAC) 40 MG capsule Take 1 capsule (40 mg total) by mouth daily. 04/26/22  Yes [name], NP  gabapentin (NEURONTIN) 300 MG capsule TAKE ONE CAPSULE (300 MG TOTAL) BY MOUTHTWO TIMES DAILY. Patient taking differently: Take 300 mg by mouth 3 (three) times daily. 04/26/22  Yes [name], NP  iron polysaccharides (NIFEREX) 150 MG capsule Take 1 capsule (150 mg total) by mouth daily. 12/10/22 01/09/23 Yes Antonieta Pert, MD  metFORMIN (GLUCOPHAGE-XR) 500 MG 24 hr tablet Take 1 tablet (500 mg total) by mouth daily with breakfast. Patient taking differently: Take 500 mg by mouth daily as needed (for elevated BGL). 10/25/22  Yes Worley, Aldona Bar, PA  pantoprazole (PROTONIX) 40 MG tablet TAKE ONE (1) TABLET 30 MINS BEFORE YOUR FIRST MEAL. Patient taking differently: Take 40 mg by mouth at bedtime. 04/26/22  Yes [name], NP  sacubitril-valsartan (ENTRESTO) 49-51 MG Take 1 tablet by mouth 2 (two) times daily. Patient taking differently: Take 1 tablet by mouth  in the morning and at bedtime. 06/07/22  Yes Satira Sark, MD  traMADol (ULTRAM) 50 MG tablet Take 1 tablet (50 mg total) by mouth every 12 (twelve) hours as needed. Patient taking differently: Take 50 mg by mouth every 12 (twelve) hours as needed for moderate pain. 12/13/22  Yes Inda Coke, PA  blood glucose meter kit and supplies Dispense based on patient and insurance preference. Use up to four times daily as directed. (FOR ICD-10 E10.9, E11.9). 11/22/20   [name], NP  Continuous Blood Gluc Receiver (FREESTYLE LIBRE 2 READER) DEVI Check blood glucose up to 4 times daily 10/11/22   Inda Coke, PA  Continuous Blood  Gluc Sensor (FREESTYLE LIBRE 2 SENSOR) MISC Apply one sensor to the upper arm avery 14 days. Patient taking differently: Inject 1 patch into the skin See admin instructions. Apply one sensor into the upper arm every 14 days 04/26/22   [name], NP  glucose blood [facility] ULTRA) test strip Use as instructed 02/26/19   Ronnald Nian, DO  glucose monitoring kit (FREESTYLE) monitoring kit Use to check blood sugar BID as directed 01/24/19   Cirigliano, Stanton Kidney K, DO  Lancets (ONETOUCH DELICA PLUS [id number]) MISC 1 each by Does not apply route 2 (two) times a day. 02/26/19   Ronnald Nian, DO  Misc. Devices MISC Please provide supplies (needle, syringe, alcohol swabs) needed for patient to self-administer B-12 injections monthly. 01/07/20   Derek Jack, MD  Tuberculin-Allergy Syringes ([place]) 25G X 1" 1 ML MISC USE TO ADMINISTER INJECTABLE VITAMIN B12. 08/09/22   Derek Jack, MD    Social History   Socioeconomic History   Marital status: Married    Spouse name: [name]   Number of children: 1   Years of education: 12   Highest education level: Not on file  Occupational History   Occupation: disabled    Comment: heart  Tobacco Use   Smoking status: Light Smoker    Packs/day: 0.50    Years: 44.00    Additional pack  years: 0.00    Total pack years: 22.00    Types: Cigarettes    Start date: 05/10/1975    Passive exposure: Never   Smokeless tobacco: Never   Tobacco comments:    2 cigarettes per day 12/10/2019  Vaping Use   Vaping Use: Former  Substance and Sexual Activity   Alcohol use: Not Currently    Alcohol/week: 2.0 standard drinks of alcohol    Types: 2 Glasses of wine per week   Drug use: No   Sexual activity: Yes    Birth control/protection: Surgical  Other Topics Concern   Not on file  Social History Narrative   Disabled   Lives with husband [name]   Two dogs   Social Determinants of Health   Financial Resource Strain: Not on file  Food Insecurity: No Food Insecurity (12/09/2022)   Hunger Vital Sign    Worried About Running Out of Food in the Last Year: Never true    Ran Out of Food in the Last Year: Never true  Transportation Needs: No Transportation Needs (12/09/2022)   PRAPARE - Hydrologist (Medical): No    Lack of Transportation (Non-Medical): No  Physical Activity: Not on file  Stress: Not on file  Social Connections: Not on file  Intimate Partner Violence: Not At Risk (12/09/2022)   Humiliation, Afraid, Rape, and Kick questionnaire    Fear of Current or Ex-Partner: No    Emotionally Abused: No    Physically Abused: No    Sexually Abused: No     Family History  Adopted: Yes  Problem Relation Age of Onset   Hypertension Father    Coronary artery disease Sister    Ulcers Maternal Grandmother    Colon cancer Neg Hx        unknown, was adopted    ROS: '[x]'$  Positive   '[ ]'$  Negative   '[ ]'$  All sytems reviewed and are negative  Cardiac: '[]'$  chest pain/pressure '[]'$  hx MI '[]'$  SOB   Vascular: '[x]'$  pain in legs while walking '[x]'$  pain in legs at rest '[x]'$  pain in  legs at night '[x]'$  non-healing ulcers '[]'$  hx of DVT '[]'$  swelling in legs  Pulmonary: '[]'$  asthma/wheezing '[]'$  home O2  Neurologic: '[]'$  hx of CVA '[]'$  mini stroke   Hematologic: '[]'$  hx of  cancer  Endocrine:   '[x]'$  diabetes '[]'$  thyroid disease  GI '[]'$  GERD  GU: '[]'$  CKD/renal failure '[]'$  HD--'[]'$  M/W/F or '[]'$  T/T/S  Psychiatric: '[]'$  anxiety '[]'$  depression  Musculoskeletal: '[]'$  arthritis '[]'$  joint pain  Integumentary: '[]'$  rashes '[x]'$  ulcers  Constitutional: '[]'$  fever  '[]'$  chills  Physical Examination  Vitals:   12/14/22 0930 12/14/22 1015  BP: (!) 164/61 (!) 169/69  Pulse: 95 100  Resp: 17 17  Temp:    SpO2: 96% 95%   Body mass index is 23.91 kg/m.  General:  WDWN in NAD Gait: Not observed HENT: WNL, normocephalic Pulmonary: normal non-labored breathing Cardiac: regular Abdomen:  soft, NT; aortic pulse is non palpable Skin: without rashes Vascular Exam/Pulses: nonpalpable pedal pulses, brisk PT signals bilaterally Extremities: worsening gangrenous changes to left toes with wound in between 1st-2nd toes. Slightly worsening ulceration of right 3rd toe. Some erythema and warmth of left foot Musculoskeletal: no muscle wasting or atrophy  Neurologic: A&O X 3 Psychiatric:  The pt has Normal affect.   CBC    Component Value Date/Time   WBC 14.5 (H) 12/14/2022 0816   RBC 4.06 12/14/2022 0816   HGB 10.1 (L) 12/14/2022 0816   HGB 15.5 09/01/2020 1510   HCT 34.2 (L) 12/14/2022 0816   HCT 45.1 09/01/2020 1510   PLT 653 (H) 12/14/2022 0816   PLT 370 09/01/2020 1510   MCV 84.2 12/14/2022 0816   MCV 94 09/01/2020 1510   MCH 24.9 (L) 12/14/2022 0816   MCHC 29.5 (L) 12/14/2022 0816   RDW 23.3 (H) 12/14/2022 0816   RDW 12.8 09/01/2020 1510   LYMPHSABS 1.3 12/13/2022 1755   LYMPHSABS 1.8 07/02/2019 1539   MONOABS 1.6 (H) 12/13/2022 1755   EOSABS 0.1 12/13/2022 1755   EOSABS 0.1 07/02/2019 1539   BASOSABS 0.1 12/13/2022 1755   BASOSABS 0.1 07/02/2019 1539    BMET    Component Value Date/Time   NA 137 12/13/2022 1755   NA 141 09/01/2020 1510   K 3.1 (L) 12/13/2022 1755   CL 101 12/13/2022 1755   CO2 24 12/13/2022 1755   GLUCOSE 153 (H) 12/13/2022 1755    BUN 11 12/13/2022 1755   BUN 7 (L) 09/01/2020 1510   CREATININE 0.74 12/14/2022 0816   CREATININE 0.64 08/12/2021 1446   CALCIUM 9.6 12/13/2022 1755   GFRNONAA >60 12/14/2022 0816   GFRNONAA 90 09/28/2017 1201   GFRAA 115 09/01/2020 1510   GFRAA 104 09/28/2017 1201    COAGS: Lab Results  Component Value Date   INR 1.2 12/08/2022   INR 0.98 04/24/2017      ASSESSMENT/PLAN: This is a 67 y.o. female with worsening BLE ischemia and rest pain in the left foot   -She has had worsening ischemic changes to her toes bilaterally, L>R. She has worsening rest pain in the left foot -On exam she has brisk PT signals bilaterally. Her pedal pulses are not palpable -She has a history of critical limb ischemia of BLE with previous plans for angio last week, however this was postponed due to severe anemia and GI bleeding -I believe she would greatly benefit from LLE angiogram soon, this will be planned tomorrow with Dr.Rielly Corlett. Her hgb is stable now at 10 with no signs of active bleeding. -Will make NPO  past midnight and place consent orders -Dr. Stanford Breed to evaluate pt and determine further plan   Vicente Serene, PA-C [facility] [phone]   VASCULAR STAFF ADDENDUM: I have independently interviewed and examined the patient. I agree with the above.  Plan angiogram tomorrow to evaluate LLE. Will plan intervention to treat CLTI.   Yevonne Aline. Stanford Breed, MD [facility] Phone Number: [phone] 12/14/2022 3:30 PM

## 2022-12-14 NOTE — H&P (Signed)
History and Physical    Patient: Tammy Mcpherson [id number] DOB: [birth year] DOA: 12/13/2022 DOS: the patient was seen and examined on 12/14/2022 PCP: Inda Coke, PA  Patient coming from: Home  Chief Complaint: No chief complaint on file.  HPI: Tammy Mcpherson is a 67 y.o. female with medical history significant of HTN, DM-2, COPD, recent hospitalization for GI bleeding from AVMs, PAD with chronic LLE ischemia-presented to the hospital with worsening rest pain/gangrenous changes.  Patient was recently hospitalized from 3/8-3/10 for GI bleeding in the context of AVMs, she underwent numerous units of PRBC transfusion-underwent endoscopic evaluation with ablation.  She was restarted on aspirin/Plavix-vascular surgery did evaluate her during this hospitalization and had planned outpatient intervention once she had completely recovered.  Unfortunately-after going home, she developed severe rest pain in her left lower extremity.  She was seen by her PCP and subsequently sent back to the hospital for further evaluation.  Patient denies any fever, headache, nausea, vomiting, diarrhea, abdominal pain, hematuria, dysuria.  Review of Systems: As mentioned in the history of present illness. All other systems reviewed and are negative. Past Medical History:  Diagnosis Date   Anemia 04/30/2016   Anxiety    Arthritis    Cardiomyopathy ([place])    a. EF 40-45% by echo in 04/2016 b. Improved to 60-65% by repeat imaging in 2018   COPD (chronic obstructive pulmonary disease) (HCC)    Coronary artery calcification seen on CT scan    Essential hypertension    GERD (gastroesophageal reflux disease)    Headache    History of bronchitis    History of GI bleed    History of hiatal hernia    Hyperlipidemia    Iron deficiency anemia    Peripheral vascular disease (HCC)    Pollen allergy    PSVT (paroxysmal supraventricular tachycardia)    PVC's (premature ventricular contractions)    Type 2 diabetes  mellitus ([place])    Vitamin B12 deficiency    Past Surgical History:  Procedure Laterality Date   ABDOMINAL AORTOGRAM W/LOWER EXTREMITY Bilateral 05/19/2021   Procedure: ABDOMINAL AORTOGRAM W/LOWER EXTREMITY;  Surgeon: [name], MD;  Location: [facility];  Service: Cardiovascular;  Laterality: Bilateral;   ABDOMINAL HYSTERECTOMY     polyps  about age 60   AIKEN OSTEOTOMY Right 07/10/2013   Procedure: Barbie Banner OSTEOTOMY RIGHT FOOT;  Surgeon: Marcheta Grammes, DPM;  Location: [facility];  Service: Orthopedics;  Laterality: Right;   AMPUTATION Left 05/09/2017   Procedure: AMPUTATION 5TH TOE LEFT FOOT;  Surgeon: [name], DPM;  Location: [facility];  Service: Podiatry;  Laterality: Left;   AMPUTATION Left 06/13/2017   Procedure: AMPUTATION 2ND TOE LEFT FOOT;  Surgeon: [name], DPM;  Location: [facility];  Service: Podiatry;  Laterality: Left;   AMPUTATION Right 06/01/2021   Procedure: Right great toe and Second Toe Amputation;  Surgeon: [name], MD;  Location: [facility];  Service: Vascular;  Laterality: Right;   BIOPSY  10/27/2022   Procedure: BIOPSY;  Surgeon: Daneil Dolin, MD;  [facility];  Service: Endoscopy;;   BUNIONECTOMY Right 07/10/2013   Procedure: Maryfrances Bunnell RIGHT FOOT;  Surgeon: Marcheta Grammes, DPM;  Location: [facility];  Service: Orthopedics;  Laterality: Right;   CHOLECYSTECTOMY N/A 08/22/2017   Procedure: LAPAROSCOPIC CHOLECYSTECTOMY;  Surgeon: Virl Cagey, MD;  Location: [facility];  Service: General;  Laterality: N/A;   COLONOSCOPY N/A 07/07/2016   Dr. Oneida Alar; redundant left colon, diverticulosis at  hepatic flexure, non-bleeding internal hemorrhoids   ENTEROSCOPY N/A 10/27/2022   Procedure: ENTEROSCOPY;  Surgeon: Daneil Dolin, MD;  [facility];  Service: Endoscopy;  Laterality: N/A;   ENTEROSCOPY N/A 12/09/2022   Procedure: ENTEROSCOPY;  Surgeon: Yetta Flock, MD;  Location: [facility];  Service:  Gastroenterology;  Laterality: N/A;   ESOPHAGOGASTRODUODENOSCOPY N/A 07/07/2016   Dr. Oneida Alar: many non-bleeding cratered gastric ulcers without stigmata of bleeding in gastric antrum. four 2-3 mm angioectasias without bleeding in duodenal bulb and second portion of duodenum s/p APC. Chroni gastritis on path.    ESOPHAGOGASTRODUODENOSCOPY N/A 08/03/2017   Dr. Oneida Alar: erosive gastritis, AVMs. Found a single non-bleeding angioectasia in stomach, s/p APC therapy. Four non-bleeding angioectasias in duodenum s/p APC. Non-bleeding erosive gastropathy   HOT HEMOSTASIS  10/27/2022   Procedure: HOT HEMOSTASIS (ARGON PLASMA COAGULATION/BICAP);  Surgeon: Daneil Dolin, MD;  [facility];  Service: Endoscopy;;   HOT HEMOSTASIS N/A 12/09/2022   Procedure: HOT HEMOSTASIS (ARGON PLASMA COAGULATION/BICAP);  Surgeon: Yetta Flock, MD;  Location: [facility] END[facility];  Service: Gastroenterology;  Laterality: N/A;   IR ANGIOGRAM EXTREMITY LEFT  04/24/2017   IR FEM POP ART ATHERECT INC PTA MOD SED  04/24/2017   IR INFUSION THROMBOL ARTERIAL INITIAL (MS)  04/24/2017   IR RADIOLOGIST EVAL & MGMT  12/05/2016   IR US GUIDE VASC ACCESS RIGHT  04/24/2017   LIVER BIOPSY N/A 08/22/2017   Procedure: LIVER BIOPSY;  Surgeon: Virl Cagey, MD;  [facility]: [facility];  Service: General;  Laterality: N/A;   METATARSAL HEAD EXCISION Right 07/10/2013   Procedure: METATARSAL HEAD RESECTION OF DIGITS 2 AND 3 RIGHT FOOT;  Surgeon: Marcheta Grammes, DPM;  Location: [facility];  Service: Orthopedics;  Laterality: Right;   PERIPHERAL VASCULAR INTERVENTION Right 05/19/2021   Procedure: PERIPHERAL VASCULAR INTERVENTION;  Surgeon: [name], MD;  Location: [facility];  Service: Cardiovascular;  Laterality: Right;   PROXIMAL INTERPHALANGEAL FUSION (PIP) Right 07/10/2013   Procedure: ARTHRODESIS PIPJ  2ND DIGIT RIGHT FOOT;  Surgeon: Marcheta Grammes, DPM;  Location: [facility];  Service: Orthopedics;  Laterality: Right;    SUBMUCOSAL TATTOO INJECTION  12/09/2022   Procedure: SUBMUCOSAL TATTOO INJECTION;  Surgeon: Yetta Flock, MD;  Location[facility];  Service: Gastroenterology;;   Social History:  reports that she has been smoking cigarettes. She started smoking about 47 years ago. She has a 22.00 pack-year smoking history. She has never been exposed to tobacco smoke. She has never used smokeless tobacco. She reports that she does not currently use alcohol after a past usage of about 2.0 standard drinks of alcohol per week. She reports that she does not use drugs.  Allergies  Allergen Reactions   Bee Venom Anaphylaxis and Swelling   Codeine Anaphylaxis and Other (See Comments)    Tongue swelled   Penicillins Anaphylaxis, Swelling and Rash    Has patient had a PCN reaction causing immediate rash, facial/tongue/throat swelling, SOB or lightheadedness with hypotension: No, delayed Has patient had a PCN reaction causing severe rash involving mucus membranes or skin necrosis: No Has patient had a PCN reaction that required hospitalization No Has patient had a PCN reaction occurring within the last 10 years: No If all of the above answers are "NO", then may proceed with Cephalosporin use.    Bupropion Other (See Comments)    "Made my skin crawl"   Sudafed [Pseudoephedrine Hcl] Rash    Family History  Adopted: Yes  Problem  Relation Age of Onset   Hypertension Father    Coronary artery disease Sister    Ulcers Maternal Grandmother    Colon cancer Neg Hx        unknown, was adopted    Prior to Admission medications   Medication Sig Start Date End Date Taking? Authorizing Provider  Acetaminophen 500 MG capsule Take 500-1,000 mg by mouth every 6 (six) hours as needed for pain (or headaches).   Yes [provider]  albuterol (PROVENTIL) (2.5 MG/3ML) 0.083% nebulizer solution Take 3 mLs (2.5 mg total) by nebulization every 4 (four) hours as needed for wheezing or shortness of breath. 04/26/22   Yes [name], NP  albuterol (VENTOLIN HFA) 108 (90 Base) MCG/ACT inhaler INHALE TWO PUFFS INTO THE LUNGS EVERY SIX HOURS AS NEEDED FOR WHEEZING OR SHORTNESS OF BREATH Patient taking differently: Inhale 2 puffs into the lungs every 6 (six) hours as needed for wheezing or shortness of breath. 04/26/22  Yes [name], NP  ALPRAZolam Duanne Moron) 0.5 MG tablet TAKE ONE TABLET (0.'5MG'$  TOTAL) BY MOUTH DAILY AS NEEDED FOR ANXIETY Patient taking differently: Take 0.5 mg by mouth daily as needed for anxiety. 04/25/22  Yes [name], NP  aspirin 81 MG chewable tablet Chew 1 tablet (81 mg total) by mouth every morning. 10/30/22  Yes Shah, Pratik D, DO  atorvastatin (LIPITOR) 80 MG tablet Take 1 tablet (80 mg total) by mouth daily. Patient taking differently: Take 80 mg by mouth at bedtime. 06/07/22  Yes Satira Sark, MD  Cholecalciferol (VITAMIN D) 50 MCG (2000 UT) CAPS Take 1 capsule (2,000 Units total) by mouth daily. 03/15/22  Yes Derek Jack, MD  clopidogrel (PLAVIX) 75 MG tablet Take 1 tablet (75 mg total) by mouth daily. TAKE ONE TABLET ('75MG'$  TOTAL) BY MOUTH DAILY WITH BREAKFAST Patient taking differently: Take 75 mg by mouth at bedtime. 10/30/22  Yes Shah, Pratik D, DO  cyanocobalamin (VITAMIN B12) 1000 MCG/ML injection INJECT 1 ML INTO THE MUSCLE EVERY 30 DAYS. Patient taking differently: Inject 1,000 mcg into the skin every 30 (thirty) days. 08/09/22  Yes Derek Jack, MD  cyclobenzaprine (FLEXERIL) 5 MG tablet Take 1 tablet (5 mg total) by mouth 3 (three) times daily as needed for muscle spasms. 04/25/22  Yes [name], NP  FLUoxetine (PROZAC) 40 MG capsule Take 1 capsule (40 mg total) by mouth daily. 04/26/22  Yes [name], NP  gabapentin (NEURONTIN) 300 MG capsule TAKE ONE CAPSULE (300 MG TOTAL) BY MOUTHTWO TIMES DAILY. Patient taking differently: Take 300 mg by mouth 3 (three) times daily. 04/26/22  Yes [name], NP  iron polysaccharides (NIFEREX) 150  MG capsule Take 1 capsule (150 mg total) by mouth daily. 12/10/22 01/09/23 Yes Antonieta Pert, MD  metFORMIN (GLUCOPHAGE-XR) 500 MG 24 hr tablet Take 1 tablet (500 mg total) by mouth daily with breakfast. Patient taking differently: Take 500 mg by mouth daily as needed (for elevated BGL). 10/25/22  Yes Worley, Aldona Bar, PA  pantoprazole (PROTONIX) 40 MG tablet TAKE ONE (1) TABLET 30 MINS BEFORE YOUR FIRST MEAL. Patient taking differently: Take 40 mg by mouth at bedtime. 04/26/22  Yes [name], NP  sacubitril-valsartan (ENTRESTO) 49-51 MG Take 1 tablet by mouth 2 (two) times daily. Patient taking differently: Take 1 tablet by mouth in the morning and at bedtime. 06/07/22  Yes Satira Sark, MD  traMADol (ULTRAM) 50 MG tablet Take 1 tablet (50 mg total) by mouth every 12 (twelve) hours as needed. Patient taking differently: Take  50 mg by mouth every 12 (twelve) hours as needed for moderate pain. 12/13/22  Yes Inda Coke, PA  blood glucose meter kit and supplies Dispense based on patient and insurance preference. Use up to four times daily as directed. (FOR ICD-10 E10.9, E11.9). 11/22/20   [name], NP  Continuous Blood Gluc Receiver (FREESTYLE LIBRE 2 READER) DEVI Check blood glucose up to 4 times daily 10/11/22   Inda Coke, PA  Continuous Blood Gluc Sensor (FREESTYLE LIBRE 2 SENSOR) MISC Apply one sensor to the upper arm avery 14 days. Patient taking differently: Inject 1 patch into the skin See admin instructions. Apply one sensor into the upper arm every 14 days 04/26/22   [name], NP  glucose blood [facility] ULTRA) test strip Use as instructed 02/26/19   Ronnald Nian, DO  glucose monitoring kit (FREESTYLE) monitoring kit Use to check blood sugar BID as directed 01/24/19   Cirigliano, Stanton Kidney K, DO  Lancets (ONETOUCH DELICA PLUS [id number]) MISC 1 each by Does not apply route 2 (two) times a day. 02/26/19   Ronnald Nian, DO  Misc. Devices MISC Please provide supplies  (needle, syringe, alcohol swabs) needed for patient to self-administer B-12 injections monthly. 01/07/20   Derek Jack, MD  Tuberculin-Allergy Syringes Flossie Buffy TUBERCULIN SAFETY SYR) 25G X 1" 1 ML MISC USE TO ADMINISTER INJECTABLE VITAMIN B12. 08/09/22   Derek Jack, MD    Physical Exam: Vitals:   12/14/22 0430 12/14/22 0530 12/14/22 0630 12/14/22 0700  BP: (!) 172/56 (!) 177/68 (!) 178/65 (!) 176/74  Pulse: 98 97 96 96  Resp: '15 20 16 18  '$ Temp:      SpO2: 98% 98% 96% 96%  Weight:      Height:       Gen Exam:Alert awake-not in any distress HEENT:atraumatic, normocephalic Chest: B/L clear to auscultation anteriorly CVS:S1S2 regular Abdomen:soft non tender, non distended Extremities:see pic below Neurology: Non focal Skin: no rash     Data Reviewed: {Tip this will not be part of the note when signed- Document your independent interpretation of telemetry     Latest Ref Rng & Units 12/13/2022    5:55 PM 12/10/2022    4:34 AM 12/09/2022    4:56 AM  CBC  WBC 4.0 - 10.5 K/uL 14.4  24.4  15.0   Hemoglobin 12.0 - 15.0 g/dL 10.6  9.0  9.4   Hematocrit 36.0 - 46.0 % 36.7  29.3  28.5   Platelets 150 - 400 K/uL 803  669  672         Latest Ref Rng & Units 12/13/2022    5:55 PM 12/10/2022   10:29 AM 12/09/2022    4:56 AM  BMP  Glucose 70 - 99 mg/dL 153   96   BUN 8 - 23 mg/dL 11   6   Creatinine 0.44 - 1.00 mg/dL 1.11   0.62   Sodium 135 - 145 mmol/L 137   135   Potassium 3.5 - 5.1 mmol/L 3.1  3.3  3.2   Chloride 98 - 111 mmol/L 101   101   CO2 22 - 32 mmol/L 24   25   Calcium 8.9 - 10.3 mg/dL 9.6   8.1      Assessment and Plan: Critical left lower extremity ischemia with worsening rest pain Gangrenous changes to numerous toes of LLE Known bilateral lower extremity PAD s/p multiple toe amputations in both foot/and angioplasty in the past This is a chronic issue that seems to  have worsened significantly over the past few days Has worsening rest pain-with  difficulty in ambulation Await VVS evaluation-probably needs amputation. Will continue DAPT/statin Will likely need narcotics for pain control She has some mild erythema but do not see any overt signs of infection-hold off on antibiotics for now-suspect this is mostly ischemia rather than infection.  Recent GI bleeding due to gastrointestinal AVMs Per patient-brown stools Will continue PPI Follow Hb trend  Chronic combined HFpEF/HFrEF Euvolemic Resume Entresto   HTN Resume Entresto-follow BP trend Will add other agents-beta-blocker etc. depending on how she does with the BP trend.  DM-2 Hold metformin Monitor CBGs on SSI  COPD Continue bronchodilators  IBS-diarrhea predominant Supportive care  GERD PPI   Advance Care Planning:   Code Status: Full Code   Consults: VVS  Family Communication: None at bedside  Severity of Illness: The appropriate patient status for this patient is INPATIENT. Inpatient status is judged to be reasonable and necessary in order to provide the required intensity of service to ensure the patient's safety. The patient's presenting symptoms, physical exam findings, and initial radiographic and laboratory data in the context of their chronic comorbidities is felt to place them at high risk for further clinical deterioration. Furthermore, it is not anticipated that the patient will be medically stable for discharge from the hospital within 2 midnights of admission.   * I certify that at the point of admission it is my clinical judgment that the patient will require inpatient hospital care spanning beyond 2 midnights from the point of admission due to high intensity of service, high risk for further deterioration and high frequency of surveillance required.*  Author: Oren Binet, MD 12/14/2022 7:45 AM  For on call review [email or web address].

## 2022-12-15 ENCOUNTER — Inpatient Hospital Stay (HOSPITAL_COMMUNITY)
Admission: EM | Disposition: A | Discharge status: Home / Self Care | Payer: Self-pay | Source: Home / Self Care | Attending: Internal Medicine

## 2022-12-15 ENCOUNTER — Ambulatory Visit (HOSPITAL_COMMUNITY): Admission: RE | Admit: 2022-12-15 | Payer: PPO | Source: Ambulatory Visit | Admitting: Vascular Surgery

## 2022-12-15 DIAGNOSIS — I70248 Atherosclerosis of native arteries of left leg with ulceration of other part of lower left leg: Secondary | ICD-10-CM

## 2022-12-15 DIAGNOSIS — I70263 Atherosclerosis of native arteries of extremities with gangrene, bilateral legs: Secondary | ICD-10-CM

## 2022-12-15 DIAGNOSIS — E1169 Type 2 diabetes mellitus with other specified complication: Secondary | ICD-10-CM

## 2022-12-15 DIAGNOSIS — I10 Essential (primary) hypertension: Secondary | ICD-10-CM | POA: Diagnosis not present

## 2022-12-15 DIAGNOSIS — I5032 Chronic diastolic (congestive) heart failure: Secondary | ICD-10-CM | POA: Diagnosis not present

## 2022-12-15 HISTORY — PX: ABDOMINAL AORTOGRAM W/LOWER EXTREMITY: CATH118223

## 2022-12-15 HISTORY — PX: PERIPHERAL VASCULAR INTERVENTION: CATH118257

## 2022-12-15 LAB — BASIC METABOLIC PANEL
Anion gap: 12 (ref 5–15)
BUN: 10 mg/dL (ref 8–23)
CO2: 26 mmol/L (ref 22–32)
Calcium: 9 mg/dL (ref 8.9–10.3)
Chloride: 97 mmol/L — ABNORMAL LOW (ref 98–111)
Creatinine, Ser: 0.6 mg/dL (ref 0.44–1.00)
GFR, Estimated: >60 mL/min (ref >60)
Glucose, Bld: 98 mg/dL (ref 70–99)
Potassium: 3.4 mmol/L — ABNORMAL LOW (ref 3.5–5.1)
Sodium: 135 mmol/L (ref 135–145)

## 2022-12-15 LAB — GLUCOSE, CAPILLARY
Glucose-Capillary: 113 mg/dL — ABNORMAL HIGH (ref 70–99)
Glucose-Capillary: 91 mg/dL (ref 70–99)
Glucose-Capillary: 95 mg/dL (ref 70–99)
Glucose-Capillary: 101 mg/dL — ABNORMAL HIGH (ref 70–99)

## 2022-12-15 LAB — CBC
HCT: 33.6 % — ABNORMAL LOW (ref 36.0–46.0)
Hemoglobin: 10.3 g/dL — ABNORMAL LOW (ref 12.0–15.0)
MCH: 25.3 pg — ABNORMAL LOW (ref 26.0–34.0)
MCHC: 30.7 g/dL (ref 30.0–36.0)
MCV: 82.6 fL (ref 80.0–100.0)
Platelets: 607 10*3/uL — ABNORMAL HIGH (ref 150–400)
RBC: 4.07 MIL/uL (ref 3.87–5.11)
RDW: 23.5 % — ABNORMAL HIGH (ref 11.5–15.5)
WBC: 15 10*3/uL — ABNORMAL HIGH (ref 4.0–10.5)
nRBC: 0 % (ref 0.0–0.2)

## 2022-12-15 SURGERY — ABDOMINAL AORTOGRAM W/LOWER EXTREMITY
Anesthesia: LOCAL

## 2022-12-15 MED ORDER — LABETALOL HCL 5 MG/ML IV SOLN
INTRAVENOUS | Status: AC
  Filled 2022-12-15: qty 4

## 2022-12-15 MED ORDER — SODIUM CHLORIDE 0.9% FLUSH
3.0000 mL | Freq: Two times a day (BID) | INTRAVENOUS | Status: DC
  Administered 2022-12-16 – 2022-12-18 (×5): 3 mL via INTRAVENOUS

## 2022-12-15 MED ORDER — HEPARIN SODIUM (PORCINE) 5000 UNIT/ML IJ SOLN: 5000.0000 [IU] | Freq: Three times a day (TID) | INTRAMUSCULAR | Status: DC

## 2022-12-15 MED ORDER — HEPARIN SODIUM (PORCINE) 1000 UNIT/ML IJ SOLN
INTRAMUSCULAR | Status: AC
  Filled 2022-12-15: qty 10

## 2022-12-15 MED ORDER — ACETAMINOPHEN 325 MG PO TABS: 650.0000 mg | ORAL_TABLET | ORAL | Status: DC | PRN

## 2022-12-15 MED ORDER — HEPARIN (PORCINE) IN NACL 1000-0.9 UT/500ML-% IV SOLN
INTRAVENOUS | Status: DC | PRN
  Administered 2022-12-15 (×2): 500 mL

## 2022-12-15 MED ORDER — IODIXANOL 320 MG/ML IV SOLN
INTRAVENOUS | Status: DC | PRN
  Administered 2022-12-15: 110 mL

## 2022-12-15 MED ORDER — ONDANSETRON HCL 4 MG/2ML IJ SOLN: 4.0000 mg | Freq: Four times a day (QID) | INTRAMUSCULAR | Status: DC | PRN

## 2022-12-15 MED ORDER — HYDRALAZINE HCL 20 MG/ML IJ SOLN
5.0000 mg | INTRAMUSCULAR | Status: DC | PRN
  Administered 2022-12-15: 5 mg via INTRAVENOUS

## 2022-12-15 MED ORDER — HEPARIN SODIUM (PORCINE) 1000 UNIT/ML IJ SOLN
INTRAMUSCULAR | Status: DC | PRN
  Administered 2022-12-15: 6000 [IU] via INTRAVENOUS

## 2022-12-15 MED ORDER — FENTANYL CITRATE (PF) 100 MCG/2ML IJ SOLN
INTRAMUSCULAR | Status: AC
  Filled 2022-12-15: qty 2

## 2022-12-15 MED ORDER — SODIUM CHLORIDE 0.9 % WEIGHT BASED INFUSION
1.0000 mL/kg/h | INTRAVENOUS | Status: AC
  Administered 2022-12-15: 1 mL/kg/h via INTRAVENOUS

## 2022-12-15 MED ORDER — SODIUM CHLORIDE 0.9% FLUSH: 3.0000 mL | INTRAVENOUS | Status: DC | PRN

## 2022-12-15 MED ORDER — LIDOCAINE HCL (PF) 1 % IJ SOLN
INTRAMUSCULAR | Status: AC
  Filled 2022-12-15: qty 30

## 2022-12-15 MED ORDER — SODIUM CHLORIDE 0.9 % IV SOLN: 250.0000 mL | INTRAVENOUS | Status: DC | PRN

## 2022-12-15 MED ORDER — INSULIN ASPART 100 UNIT/ML IJ SOLN
0.0000 [IU] | Freq: Three times a day (TID) | INTRAMUSCULAR | Status: DC
  Administered 2022-12-16: 1 [IU] via SUBCUTANEOUS
  Administered 2022-12-17: 2 [IU] via SUBCUTANEOUS
  Administered 2022-12-18: 1 [IU] via SUBCUTANEOUS

## 2022-12-15 MED ORDER — LIDOCAINE HCL (PF) 1 % IJ SOLN
INTRAMUSCULAR | Status: DC | PRN
  Administered 2022-12-15: 10 mL via INTRADERMAL

## 2022-12-15 MED ORDER — LABETALOL HCL 5 MG/ML IV SOLN
10.0000 mg | INTRAVENOUS | Status: DC | PRN
  Administered 2022-12-15 – 2022-12-18 (×3): 10 mg via INTRAVENOUS
  Filled 2022-12-15 (×2): qty 4

## 2022-12-15 MED ORDER — SODIUM CHLORIDE 0.9 % IV SOLN: INTRAVENOUS | Status: DC

## 2022-12-15 MED ORDER — MIDAZOLAM HCL 2 MG/2ML IJ SOLN
INTRAMUSCULAR | Status: DC | PRN
  Administered 2022-12-15: 1 mg via INTRAVENOUS

## 2022-12-15 MED ORDER — OXYCODONE HCL 5 MG PO TABS
ORAL_TABLET | ORAL | Status: AC
  Filled 2022-12-15: qty 1

## 2022-12-15 MED ORDER — FENTANYL CITRATE (PF) 100 MCG/2ML IJ SOLN
INTRAMUSCULAR | Status: DC | PRN
  Administered 2022-12-15: 50 ug via INTRAVENOUS

## 2022-12-15 MED ORDER — MIDAZOLAM HCL 5 MG/5ML IJ SOLN
INTRAMUSCULAR | Status: AC
  Filled 2022-12-15: qty 5

## 2022-12-15 SURGICAL SUPPLY — 22 items
BALLN MUSTANG 4X100X135 (BALLOONS) ×2
BALLOON MUSTANG 4X100X135 (BALLOONS) IMPLANT
CATH CROSS OVER TEMPO 5F (CATHETERS) IMPLANT
CATH OMNI FLUSH 5F 65CM (CATHETERS) IMPLANT
CATH QUICKCROSS SUPP .035X90CM (MICROCATHETER) IMPLANT
CLOSURE PERCLOSE PROSTYLE (VASCULAR PRODUCTS) IMPLANT
GLIDEWIRE ADV .035X260CM (WIRE) IMPLANT
GUIDEWIRE ANGLED .035X150CM (WIRE) IMPLANT
KIT MICROPUNCTURE NIT STIFF (SHEATH) IMPLANT
KIT PV (KITS) ×2 IMPLANT
SHEATH CATAPULT 6FR 45 (SHEATH) IMPLANT
SHEATH PINNACLE 5F 10CM (SHEATH) IMPLANT
SHEATH PROBE COVER 6X72 (BAG) IMPLANT
STENT ZILVER PTX 5X120 (Permanent Stent) IMPLANT
STENT ZILVER PTX 5X140 (Permanent Stent) IMPLANT
STENT ZILVER PTX 5X80 (Permanent Stent) IMPLANT
STOPCOCK MORSE 400PSI 3WAY (MISCELLANEOUS) IMPLANT
SYR MEDRAD MARK 7 150ML (SYRINGE) ×2 IMPLANT
TRANSDUCER W/STOPCOCK (MISCELLANEOUS) ×2 IMPLANT
TRAY PV CATH (CUSTOM PROCEDURE TRAY) ×2 IMPLANT
TUBING CIL FLEX 10 FLL-RA (TUBING) IMPLANT
WIRE STARTER BENTSON 035X150 (WIRE) IMPLANT

## 2022-12-15 NOTE — Progress Notes (Signed)
[facility] [place] PROGRESS NOTE  ASSESSMENT / PLAN: Tammy Mcpherson is a 67 y.o. female with bilateral lower extremity chronic limb threatening ischemia, left worse than right. Plan bilateral lower extremity angiography today with likely intervention in right leg.   SUBJECTIVE: No complaints. No interval change in the feet.  OBJECTIVE: BP (!) 149/63 (BP Location: Left Arm)   Pulse (!) 108   Temp 98.8 F (37.1 C) (Oral)   Resp 14   Ht 5\' 3"  (1.6 m)   Wt 61.2 kg   SpO2 93%   BMI 23.91 kg/m   Intake/Output Summary (Last 24 hours) at 12/15/2022 0933 Last data filed at 12/15/2022 0409 Gross per 24 hour  Intake --  Output 0 ml  Net 0 ml    No distress Regular rate and rhythm Unlabored breathing Gangrenous forefeet bilaterally     Latest Ref Rng & Units 12/15/2022    8:17 AM 12/14/2022    8:16 AM 12/13/2022    5:55 PM  CBC  WBC 4.0 - 10.5 K/uL 15.0  14.5  14.4   Hemoglobin 12.0 - 15.0 g/dL 10.3  10.1  10.6   Hematocrit 36.0 - 46.0 % 33.6  34.2  36.7   Platelets 150 - 400 K/uL 607  653  803         Latest Ref Rng & Units 12/15/2022    8:17 AM 12/14/2022    8:16 AM 12/13/2022    5:55 PM  CMP  Glucose 70 - 99 mg/dL 98   153   BUN 8 - 23 mg/dL 10   11   Creatinine 0.44 - 1.00 mg/dL 0.60  0.74  1.11   Sodium 135 - 145 mmol/L 135   137   Potassium 3.5 - 5.1 mmol/L 3.4   3.1   Chloride 98 - 111 mmol/L 97   101   CO2 22 - 32 mmol/L 26   24   Calcium 8.9 - 10.3 mg/dL 9.0   9.6   Total Protein 6.5 - 8.1 g/dL   7.4   Total Bilirubin 0.3 - 1.2 mg/dL   0.2   Alkaline Phos 38 - 126 U/L   125   AST 15 - 41 U/L   12   ALT 0 - 44 U/L   10     Estimated Creatinine Clearance: 58 mL/min (by C-G formula based on SCr of 0.6 mg/dL).  Yevonne Aline. Stanford Breed, MD [facility] Phone Number: [phone] 12/15/2022 9:33 AM

## 2022-12-15 NOTE — Op Note (Signed)
DATE OF SERVICE: 12/15/2022  PATIENT:  Tammy Mcpherson  67 y.o. female  PRE-OPERATIVE DIAGNOSIS:  Atherosclerosis of native arteries of bilateral lower extremities causing gangrene  POST-OPERATIVE DIAGNOSIS:  Same  PROCEDURE:   1) Ultrasound guided right common femoral artery access 2) Aortogram 3) Left lower extremity angiogram with third order cannulation (165mL total contrast) 4) Left femoropopliteal angioplasty and stenting (5x163mm, 5x157mm, 5x60mm Zilver) 5) Right lower extremity angiogram 6) Conscious sedation (47 minutes)  SURGEON:  Yevonne Aline. Stanford Breed, MD  ASSISTANT: none  ANESTHESIA:   local and IV sedation  ESTIMATED BLOOD LOSS: minimal  LOCAL MEDICATIONS USED:  LIDOCAINE   COUNTS: confirmed correct.  PATIENT DISPOSITION:  PACU - hemodynamically stable.   Delay start of Pharmacological VTE agent (>24hrs) due to surgical blood loss or risk of bleeding: no  INDICATION FOR PROCEDURE: DAO MIEDEMA is a 67 y.o. female with bilateral lower extremity gangrene. After careful discussion of risks, benefits, and alternatives the patient was offered angiography. The patient understood and wished to proceed.  OPERATIVE FINDINGS:  Globally diminutive arteries Healthy femoropopliteal vessels measuring ~62mm Tibial vessels measuring <59mm  Terminal aorta and iliac arteries: Patent  Right lower extremity: Common femoral artery: patent  Profunda femoris artery: patent  Superficial femoral artery: diffuse disease; multifocal severe stenosis greatest 70%; existing femoropopliteal stenting patent but with instent stenosis ~ 40%. Popliteal artery: patent above, behind and below knee Anterior tibial artery: patent Tibioperoneal trunk: patent Peroneal artery: patent Posterior tibial artery: patent Pedal circulation: disadvantaged  Left lower extremity: Common femoral artery: patent  Profunda femoris artery: patent  Superficial femoral artery: diffuse disease; proximal significant  stenosis ~60%. Chronic total occlusion about Hunter's canal about 37mm with severe disease in reconstituted above knee popliteal artery.  Popliteal artery: Remainder of popliteal artery without significant stenosis.  Anterior tibial artery: patent Tibioperoneal trunk: patent Peroneal artery: patent Posterior tibial artery: patent Pedal circulation: disadvantaged  GLASS score. FP 3. IP 0. Stage II.   WIfI score. 2 / 3 /1. Stage IV.  DESCRIPTION OF PROCEDURE: After identification of the patient in the pre-operative holding area, the patient was transferred to the operating room. The patient was positioned supine on the operating room table.  Anesthesia was induced. The groins was prepped and draped in standard fashion. A surgical pause was performed confirming correct patient, procedure, and operative location.  The right groin was anesthetized with subcutaneous injection of 1% lidocaine. Using ultrasound guidance, the right common femoral artery was accessed with micropuncture technique. Fluoroscopy was used to confirm cannulation over the femoral head. The 78F sheath was upsized to 32F.   A Benson wire was advanced into the distal aorta. Over the wire an omni flush catheter was advanced to the level of L2. Aortogram was performed - see above for details.   The left common iliac artery was selected with an omniflush catheter and glidewire advantage guidewire. The wire was advanced into the common femoral artery. Over the wire the omni flush catheter was advanced into the external iliac artery. Selective angiography was performed - see above for details.   The decision was made to intervene. The patient was heparinized with 6000 units of heparin. The 32F sheath was exchanged for a 43F x 45cm sheath. Selective angiography of the left lower extremity was performed prior to intervention.   The lesions were treated with: Left femoropopliteal angioplasty and stenting (5x125mm, 5x124mm, 5x2mm  Zilver)  Completion angiography revealed:  Resolution of L SFA / popliteal stenosis and occlusion.  A perclose device was used to close the arteriotomy. Hemostasis was adequate upon completion.  Conscious sedation was administered with the use of IV fentanyl and midazolam under continuous physician and nurse monitoring.  Heart rate, blood pressure, and oxygen saturation were continuously monitored.  Total sedation time was 47 minutes  Upon completion of the case instrument and sharps counts were confirmed correct. The patient was transferred to the PACU in good condition. I was present for all portions of the procedure.  PLAN: ASA 81mg  PO QD. Plavix 75mg  PO QD. High intensity statin therapy. Plan angiography Monday with focus on R leg.  Yevonne Aline. Stanford Breed, MD [facility] Phone Number: [phone] 12/15/2022 10:51 AM

## 2022-12-15 NOTE — Progress Notes (Signed)
PROGRESS NOTE        PATIENT DETAILS Name: Tammy Mcpherson Age: 67 y.o. Sex: female Date of Birth: 10/31/1955 Admit Date: 12/13/2022 Admitting Physician Evalee Mutton Kristeen Mans, MD [id number], [place], [place]  Brief Summary: Patient is a 67 y.o.  female with known history of PAD-with chronic bilateral lower extremity ischemia, recent hospitalization for GI bleeding from AVMs-presenting with worsening gangrenous changes/rest pain involving left lower extremity.  Significant events: 3/13>> admit to [facility]  Significant studies: 3/13>> x-ray left foot: No osteomyelitis.  Significant microbiology data: 3/13>> blood culture: No growth  Procedures: None  Consults: Vascular surgery  Subjective: Lying comfortably in bed-denies any chest pain or shortness of breath.  Continues to have rest pain in her left lower extremity.  Gangrenous changes in foot appears unchanged from yesterday.  Objective: Vitals: Blood pressure (!) 149/63, pulse (!) 108, temperature 98.8 F (37.1 C), temperature source Oral, resp. rate 14, height 5\' 3"  (1.6 m), weight 61.2 kg, SpO2 93 %.   Exam: Gen Exam:Alert awake-not in any distress HEENT:atraumatic, normocephalic Chest: B/L clear to auscultation anteriorly CVS:S1S2 regular Abdomen:soft non tender, non distended Extremities:no edema-see pictures from yesterday-appears unchanged. Neurology: Non focal Skin: no rash  Pertinent Labs/Radiology:    Latest Ref Rng & Units 12/15/2022    8:17 AM 12/14/2022    8:16 AM 12/13/2022    5:55 PM  CBC  WBC 4.0 - 10.5 K/uL 15.0  14.5  14.4   Hemoglobin 12.0 - 15.0 g/dL 10.3  10.1  10.6   Hematocrit 36.0 - 46.0 % 33.6  34.2  36.7   Platelets 150 - 400 K/uL 607  653  803     Lab Results  Component Value Date   NA 137 12/13/2022   K 3.1 (L) 12/13/2022   CL 101 12/13/2022   CO2 24 12/13/2022      Assessment/Plan: Critical left lower extremity ischemia with worsening rest pain Gangrenous  changes to numerous toes of LLE Known bilateral lower extremity PAD s/p multiple toe amputations in both foot/and angioplasty in the past This is a chronic issue that seems to have worsened significantly over the past few days Has worsening rest pain-with difficulty in ambulation/weightbearing  Continue DAPT/statin Continue as needed narcotics for pain control Has some erythema and mild leukocytosis but no obvious  infection Vascular surgery planning angiogram today Await further recommendations   Recent GI bleeding due to gastrointestinal AVMs Per patient-brown stools Will continue PPI Follow Hb trend   Chronic combined HFpEF/HFrEF Euvolemic Continue Entresto       HTN BP remains on the higher side Continue Entresto Start low-dose Coreg  Follow/chest   DM-2 Hold metformin Continue SSI  Recent Labs    12/15/22 0822  GLUCAP 113*      COPD Continue bronchodilators   IBS-diarrhea predominant Supportive care   GERD PPI  BMI Estimated body mass index is 23.91 kg/m as calculated from the following:   Height as of this encounter: 5\' 3"  (1.6 m).   Weight as of this encounter: 61.2 kg.   Code status:   Code Status: Full Code   DVT Prophylaxis: enoxaparin (LOVENOX) injection 40 mg Start: 12/14/22 1000   Family Communication: Spouse at bedside   Disposition Plan: Status is: Inpatient Remains inpatient appropriate because: Severity of illness   Planned Discharge Destination:Home health   Diet: Diet Order  Diet NPO time specified Except for: Sips with Meds  Diet effective midnight                     Antimicrobial agents: Anti-infectives (From admission, onward)    Start     Dose/Rate Route Frequency Ordered Stop   12/14/22 0400  clindamycin (CLEOCIN) IVPB 600 mg        600 mg 100 mL/hr over 30 Minutes Intravenous  Once 12/14/22 0337 12/14/22 0443        MEDICATIONS: Scheduled Meds:  aspirin  81 mg Oral q morning    atorvastatin  80 mg Oral QHS   cholecalciferol  2,000 Units Oral Daily   clopidogrel  75 mg Oral QHS   enoxaparin (LOVENOX) injection  40 mg Subcutaneous Daily   FLUoxetine  40 mg Oral Daily   gabapentin  300 mg Oral TID   insulin aspart  0-9 Units Subcutaneous TID WC   pantoprazole  40 mg Oral QHS   polyethylene glycol  17 g Oral Daily   sacubitril-valsartan  1 tablet Oral BID   sodium chloride flush  3 mL Intravenous Q12H   Continuous Infusions:  sodium chloride     sodium chloride     PRN Meds:.sodium chloride, acetaminophen **OR** acetaminophen, albuterol, ALPRAZolam, cyclobenzaprine, fentaNYL (SUBLIMAZE) injection, hydrALAZINE, ondansetron **OR** ondansetron (ZOFRAN) IV, oxyCODONE, sodium chloride flush   I have personally reviewed following labs and imaging studies  LABORATORY DATA: CBC: Recent Labs  Lab 12/09/22 0456 12/10/22 0434 12/13/22 1755 12/14/22 0816 12/15/22 0817  WBC 15.0* 24.4* 14.4* 14.5* 15.0*  NEUTROABS  --   --  11.2*  --   --   HGB 9.4* 9.0* 10.6* 10.1* 10.3*  HCT 28.5* 29.3* 36.7 34.2* 33.6*  MCV 76.6* 79.0* 83.0 84.2 82.6  PLT 672* 669* 803* 653* 607*    Basic Metabolic Panel: Recent Labs  Lab 12/08/22 1020 12/08/22 1035 12/09/22 0456 12/10/22 1029 12/13/22 1755 12/14/22 0816  NA 133* 135 135  --  137  --   K 3.3* 3.3* 3.2* 3.3* 3.1*  --   CL 95* 96* 101  --  101  --   CO2  --  23 25  --  24  --   GLUCOSE 190* 191* 96  --  153*  --   BUN 7* 7* 6*  --  11  --   CREATININE 0.60 0.84 0.62  --  1.11* 0.74  CALCIUM  --  8.6* 8.1*  --  9.6  --   MG  --   --  2.0  --   --   --     GFR: Estimated Creatinine Clearance: 58 mL/min (by C-G formula based on SCr of 0.74 mg/dL).  Liver Function Tests: Recent Labs  Lab 12/09/22 0456 12/13/22 1755  AST 18 12*  ALT 11 10  ALKPHOS 98 125  BILITOT 1.8* 0.2*  PROT 6.1* 7.4  ALBUMIN 2.3* 2.8*   No results for input(s): "LIPASE", "AMYLASE" in the last 168 hours. No results for input(s):  "AMMONIA" in the last 168 hours.  Coagulation Profile: Recent Labs  Lab 12/08/22 1125  INR 1.2    Cardiac Enzymes: No results for input(s): "CKTOTAL", "CKMB", "CKMBINDEX", "TROPONINI" in the last 168 hours.  BNP (last 3 results) No results for input(s): "PROBNP" in the last 8760 hours.  Lipid Profile: No results for input(s): "CHOL", "HDL", "LDLCALC", "TRIG", "CHOLHDL", "LDLDIRECT" in the last 72 hours.  Thyroid Function Tests: No results for input(s): "  TSH", "T4TOTAL", "FREET4", "T3FREE", "THYROIDAB" in the last 72 hours.  Anemia Panel: No results for input(s): "VITAMINB12", "FOLATE", "FERRITIN", "TIBC", "IRON", "RETICCTPCT" in the last 72 hours.  Urine analysis:    Component Value Date/Time   COLORURINE STRAW (A) 10/26/2022 1540   APPEARANCEUR CLEAR 10/26/2022 1540   LABSPEC 1.004 (L) 10/26/2022 1540   PHURINE 6.0 10/26/2022 1540   GLUCOSEU NEGATIVE 10/26/2022 1540   GLUCOSEU NEGATIVE 04/26/2022 1508   HGBUR SMALL (A) 10/26/2022 1540   BILIRUBINUR NEGATIVE 10/26/2022 1540   BILIRUBINUR negative 06/17/2020 1534   KETONESUR NEGATIVE 10/26/2022 1540   PROTEINUR NEGATIVE 10/26/2022 1540   UROBILINOGEN 0.2 04/26/2022 1508   NITRITE NEGATIVE 10/26/2022 1540   LEUKOCYTESUR TRACE (A) 10/26/2022 1540    Sepsis Labs: Lactic Acid, Venous    Component Value Date/Time   LATICACIDVEN 1.6 12/13/2022 1758    MICROBIOLOGY: Recent Results (from the past 240 hour(s))  Blood culture (routine x 2)     Status: None (Preliminary result)   Collection Time: 12/13/22  5:58 PM   Specimen: BLOOD LEFT FOREARM  Result Value Ref Range Status   Specimen Description BLOOD LEFT FOREARM  Final   Special Requests   Final    BOTTLES DRAWN AEROBIC AND ANAEROBIC Blood Culture results may not be optimal due to an excessive volume of blood received in culture bottles   Culture   Final    NO GROWTH < 24 HOURS Performed at [facility], [place] [address] [place], [place] [address]    Report  Status PENDING  Incomplete    RADIOLOGY STUDIES/RESULTS: DG Foot Complete Left  Result Date: 12/13/2022 CLINICAL DATA:  Dry gangrene. EXAM: LEFT FOOT - COMPLETE 3+ VIEW COMPARISON:  Foot x-rays 11/06/2022 FINDINGS: Patient is status post second and fifth toe amputation the level of the MTP joints. No evidence for an acute fracture or dislocation. No bony lysis to suggest osteomyelitis. IMPRESSION: Stable.  No new or progressive interval findings. Electronically Signed   By: Misty Stanley M.D.   On: 12/13/2022 19:03     LOS: 1 day   Oren Binet, MD  [facility]    To contact the attending provider between 7A-7P or the covering provider during after hours 7P-7A, please log into the web site [email or web address] and access using universal [place] password for that web site. If you do not have the password, please call the hospital operator.  12/15/2022, 9:03 AM

## 2022-12-15 NOTE — Progress Notes (Signed)
Pt still voicing concern and upset that her room isn't ready.  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 Also states she is calling a Chief Executive Officer and suing the hospital Monday morning and there will be no more waiting, support given, pt aware that room is assigned and cleaning, husband may visit at that time, the holding area does not allow visitors at this time, safety maintained

## 2022-12-15 NOTE — Progress Notes (Signed)
Pt upset that she has to stay night in hospital, Dr. Stanford Breed in to see pt earlier, pt refusing po food/fluid at this time, informed to let RN know if she changes her mind, support given, safety maintained

## 2022-12-16 DIAGNOSIS — E876 Hypokalemia: Secondary | ICD-10-CM | POA: Diagnosis not present

## 2022-12-16 DIAGNOSIS — I10 Essential (primary) hypertension: Secondary | ICD-10-CM | POA: Diagnosis not present

## 2022-12-16 DIAGNOSIS — I998 Other disorder of circulatory system: Secondary | ICD-10-CM | POA: Diagnosis not present

## 2022-12-16 DIAGNOSIS — E1169 Type 2 diabetes mellitus with other specified complication: Secondary | ICD-10-CM | POA: Diagnosis not present

## 2022-12-16 LAB — BASIC METABOLIC PANEL
Anion gap: 12 (ref 5–15)
BUN: 9 mg/dL (ref 8–23)
CO2: 24 mmol/L (ref 22–32)
Calcium: 8.6 mg/dL — ABNORMAL LOW (ref 8.9–10.3)
Chloride: 98 mmol/L (ref 98–111)
Creatinine, Ser: 0.57 mg/dL (ref 0.44–1.00)
GFR, Estimated: >60 mL/min (ref >60)
Glucose, Bld: 96 mg/dL (ref 70–99)
Potassium: 3.2 mmol/L — ABNORMAL LOW (ref 3.5–5.1)
Sodium: 134 mmol/L — ABNORMAL LOW (ref 135–145)

## 2022-12-16 LAB — GLUCOSE, CAPILLARY
Glucose-Capillary: 127 mg/dL — ABNORMAL HIGH (ref 70–99)
Glucose-Capillary: 106 mg/dL — ABNORMAL HIGH (ref 70–99)
Glucose-Capillary: 116 mg/dL — ABNORMAL HIGH (ref 70–99)
Glucose-Capillary: 84 mg/dL (ref 70–99)

## 2022-12-16 LAB — LIPID PANEL
Cholesterol: 79 mg/dL (ref 0–200)
HDL: 24 mg/dL — ABNORMAL LOW (ref >40)
LDL Cholesterol: 29 mg/dL (ref 0–99)
Total CHOL/HDL Ratio: 3.3 RATIO
Triglycerides: 129 mg/dL (ref <150)
VLDL: 26 mg/dL (ref 0–40)

## 2022-12-16 LAB — CBC
HCT: 30.2 % — ABNORMAL LOW (ref 36.0–46.0)
Hemoglobin: 9.2 g/dL — ABNORMAL LOW (ref 12.0–15.0)
MCH: 25.1 pg — ABNORMAL LOW (ref 26.0–34.0)
MCHC: 30.5 g/dL (ref 30.0–36.0)
MCV: 82.3 fL (ref 80.0–100.0)
Platelets: 495 10*3/uL — ABNORMAL HIGH (ref 150–400)
RBC: 3.67 MIL/uL — ABNORMAL LOW (ref 3.87–5.11)
RDW: 23.6 % — ABNORMAL HIGH (ref 11.5–15.5)
WBC: 13 10*3/uL — ABNORMAL HIGH (ref 4.0–10.5)
nRBC: 0 % (ref 0.0–0.2)

## 2022-12-16 MED ORDER — CARVEDILOL 6.25 MG PO TABS
6.2500 mg | ORAL_TABLET | Freq: Two times a day (BID) | ORAL | Status: DC
  Administered 2022-12-16 – 2022-12-19 (×7): 6.25 mg via ORAL
  Filled 2022-12-16 (×7): qty 1

## 2022-12-16 MED ORDER — POTASSIUM CHLORIDE CRYS ER 20 MEQ PO TBCR
40.0000 meq | EXTENDED_RELEASE_TABLET | Freq: Four times a day (QID) | ORAL | Status: AC
  Administered 2022-12-16 (×2): 40 meq via ORAL
  Filled 2022-12-16 (×2): qty 2

## 2022-12-16 NOTE — Evaluation (Signed)
Occupational Therapy Evaluation Patient Details Name: Tammy Mcpherson MRN: [id number] DOB: 26-Nov-1955 Today's Date: 12/16/2022   History of Present Illness Pt is a 67 y/o F presenting to ED on 3/13 from PCP for L foot gangrene, s/p LLE angiogram femoropopliteal angoiplasty and stenting. Plan for RLE revascularization 3/18. PMH includes HTN, DM2, COPD, recent hospitalization for GI bleed from AVMs, PAD with chronic LLE ischemia.   Clinical Impression   PTA, pt needing HHA at baseline for household ambulation, ind with ADLs, lives with spouse who can assist at d/c. Pt currently needing set up -mod A for ADLs, min A for bed mobility,and min A +2 for transfers with RW. Pt SpO2 down to 82% on RA with sitting EOB, 2L O2 donned and SpO2 remained in low 90's throughout session. Pt presenting with impairments listed below, will follow acutely. Recommend HHOT at d/c.     Recommendations for follow up therapy are one component of a multi-disciplinary discharge planning process, led by the attending physician.  Recommendations may be updated based on patient status, additional functional criteria and insurance authorization.   Follow Up Recommendations  Home health OT     Assistance Recommended at Discharge Intermittent Supervision/Assistance  Patient can return home with the following A little help with walking and/or transfers;A lot of help with bathing/dressing/bathroom;Assistance with cooking/housework;Direct supervision/assist for medications management;Direct supervision/assist for financial management;Assist for transportation;Help with stairs or ramp for entrance    Functional Status Assessment  Patient has had a recent decline in their functional status and demonstrates the ability to make significant improvements in function in a reasonable and predictable amount of time.  Equipment Recommendations  Other (comment) (RW)    Recommendations for Other Services PT consult     Precautions /  Restrictions Precautions Precautions: Fall Restrictions Weight Bearing Restrictions: No      Mobility Bed Mobility Overal bed mobility: Needs Assistance Bed Mobility: Supine to Sit     Supine to sit: Min assist, HOB elevated     General bed mobility comments: HOB elevated, minA to complete movement. Pt attempted without O2 and SpO2 to low of 82%, 2L O2 returned.    Transfers Overall transfer level: Needs assistance Equipment used: Rolling walker (2 wheels) Transfers: Sit to/from Stand, Bed to chair/wheelchair/BSC Sit to Stand: Min assist, +2 physical assistance Stand pivot transfers: Min assist                Balance Overall balance assessment: Needs assistance Sitting-balance support: No upper extremity supported Sitting balance-Leahy Scale: Fair     Standing balance support: Bilateral upper extremity supported, During functional activity, Reliant on assistive device for balance Standing balance-Leahy Scale: Poor                             ADL either performed or assessed with clinical judgement   ADL Overall ADL's : Needs assistance/impaired Eating/Feeding: Set up   Grooming: Set up;Standing;Sitting   Upper Body Bathing: Minimal assistance   Lower Body Bathing: Moderate assistance   Upper Body Dressing : Minimal assistance   Lower Body Dressing: Moderate assistance   Toilet Transfer: Minimal assistance;+2 for physical assistance   Toileting- Clothing Manipulation and Hygiene: Supervision/safety;Sit to/from stand       Functional mobility during ADLs: Minimal assistance;+2 for physical assistance       Vision   Vision Assessment?: No apparent visual deficits     Perception Perception Perception Tested?: No   Praxis Praxis  Praxis tested?: Not tested    Pertinent Vitals/Pain Pain Assessment Pain Assessment: Faces Pain Score: 4  Faces Pain Scale: Hurts little more Pain Location: feet Pain Descriptors / Indicators:  Discomfort Pain Intervention(s): Limited activity within patient's tolerance, Monitored during session, Repositioned     Hand Dominance     Extremity/Trunk Assessment Upper Extremity Assessment Upper Extremity Assessment: Generalized weakness   Lower Extremity Assessment Lower Extremity Assessment: Defer to PT evaluation   Cervical / Trunk Assessment Cervical / Trunk Assessment: Kyphotic   Communication Communication Communication: No difficulties   Cognition Arousal/Alertness: Awake/alert Behavior During Therapy: Flat affect Overall Cognitive Status: Impaired/Different from baseline Area of Impairment: Attention, Safety/judgement, Problem solving                   Current Attention Level: Sustained     Safety/Judgement: Decreased awareness of safety   Problem Solving: Slow processing, Decreased initiation, Difficulty sequencing, Requires verbal cues General Comments: pt with slowed responses but able to express needs and follow simple cues. needs cues for safety     General Comments  SpO2 as low as 82% on RA once seated EOB, 2L O2 left donned throughout session    Exercises     Shoulder Instructions      Home Living Family/patient expects to be discharged to:: Private residence Living Arrangements: Spouse/significant other Available Help at Discharge: Family;Available 24 hours/day Type of Home: House Home Access: Stairs to enter [employer] of Steps: 1 small threshold   Home Layout: One level     Bathroom Shower/Tub: Teacher, early years/pre: Standard     Home Equipment: Theatre stage manager seat;Grab bars - tub/shower;Hand held shower head   Additional Comments: 2L O2 PRN at baseline      Prior Functioning/Environment Prior Level of Function : Needs assist             Mobility Comments: spouse had been providing HHA to ambulate in house. ADLs Comments: independent with ADLs, spouse assists with mobility         OT Problem List: Decreased strength;Decreased range of motion;Decreased activity tolerance;Impaired balance (sitting and/or standing);Decreased cognition;Decreased safety awareness;Cardiopulmonary status limiting activity      OT Treatment/Interventions: Self-care/ADL training;Therapeutic exercise;Energy conservation;DME and/or AE instruction    OT Goals(Current goals can be found in the care plan section) Acute Rehab OT Goals Patient Stated Goal: none stated OT Goal Formulation: With patient Time For Goal Achievement: 12/30/22 Potential to Achieve Goals: Good ADL Goals Pt Will Perform Lower Body Dressing: with min guard assist;sit to/from stand Pt Will Transfer to Toilet: with min guard assist;ambulating;regular height toilet Pt Will Perform Tub/Shower Transfer: Tub transfer;Shower transfer;ambulating;shower seat;with min guard assist  OT Frequency: Min 2X/week    Co-evaluation PT/OT/SLP Co-Evaluation/Treatment: Yes Reason for Co-Treatment: Complexity of the patient's impairments (multi-system involvement);To address functional/ADL transfers;For patient/therapist safety PT goals addressed during session: Mobility/safety with mobility;Balance;Proper use of DME;Strengthening/ROM OT goals addressed during session: ADL's and self-care      AM-PAC OT "6 Clicks" Daily Activity     Outcome Measure Help from another person eating meals?: None Help from another person taking care of personal grooming?: A Little Help from another person toileting, which includes using toliet, bedpan, or urinal?: A Little Help from another person bathing (including washing, rinsing, drying)?: A Lot Help from another person to put on and taking off regular upper body clothing?: A Little Help from another person to put on and taking off regular lower body clothing?: A Lot 6  Click Score: 17   End of Session Equipment Utilized During Treatment: Gait belt;Oxygen;Rolling walker (2 wheels) (2L) Nurse  Communication: Mobility status  Activity Tolerance: Patient tolerated treatment well Patient left: in chair;with call bell/phone within reach;with chair alarm set;with family/visitor present  OT Visit Diagnosis: Unsteadiness on feet (R26.81);Other abnormalities of gait and mobility (R26.89);Muscle weakness (generalized) (M62.81)                Time: [id number] OT Time Calculation (min): 31 min Charges:  OT General Charges $OT Visit: 1 Visit OT Evaluation $OT Eval Moderate Complexity: 1 Mod  Quran Vasco K, OTD, OTR/L SecureChat Preferred Acute Rehab [phone]   Izan Miron K Koonce 12/16/2022, 4:30 PM

## 2022-12-16 NOTE — Progress Notes (Addendum)
      Subjective  - no new complaints, over all appears comfortable   Objective (!) 152/76 91 98.2 F (36.8 C) (Oral) 20 95% No intake or output data in the 24 hours ending 12/16/22 0649  Left PT/DP signals, no pedal signal on the right.   Right third toe wound, left GT-3 wounds Lungs non labored breathing     Assessment/Planning: Bilateral LE ischemic changes with rest pain.  Left ischemic toes and > pain that the right initially.  Toe wounds are followed by DPM Dr. [name].  Of note she recently had severe anemia in the setting of GI bleeding.   She underwent enteroscopy with ablation of multiple erosions in her duodenum and jejunum on 3/9, however there was no overt bleeding found. She was cleared to resume her Plavix and was discharged home on 3/10.    POD # 1 angiogram with left femoropopliteal angioplasty and stenting   Left lower extremity: Common femoral artery: patent  Profunda femoris artery: patent  Superficial femoral artery: diffuse disease; proximal significant stenosis ~60%. Chronic total occlusion about Hunter's canal about 32mm with severe disease in reconstituted above knee popliteal artery.  Popliteal artery: Remainder of popliteal artery without significant stenosis.  Anterior tibial artery: patent Tibioperoneal trunk: patent Peroneal artery: patent Posterior tibial artery: patent Pedal circulation: disadvantaged  Plan for Right LE endovascular angiogram with possible intervention  Findings on 12/15/22  Right lower extremity: Common femoral artery: patent  Profunda femoris artery: patent  Superficial femoral artery: diffuse disease; multifocal severe stenosis greatest 70%; existing femoropopliteal stenting patent but with instent stenosis ~ 40%. Popliteal artery: patent above, behind and below knee Anterior tibial artery: patent Tibioperoneal trunk: patent Peroneal artery: patent Posterior tibial artery: patent Pedal circulation:  disadvantaged  Plan for medical management with ASA, Plavix and a daily Statin.      Roxy Horseman 12/16/2022 6:49 AM --  Laboratory Lab Results: Recent Labs    12/15/22 0817 12/16/22 0134  WBC 15.0* 13.0*  HGB 10.3* 9.2*  HCT 33.6* 30.2*  PLT 607* 495*   BMET Recent Labs    12/15/22 0817 12/16/22 0134  NA 135 134*  K 3.4* 3.2*  CL 97* 98  CO2 26 24  GLUCOSE 98 96  BUN 10 9  CREATININE 0.60 0.57  CALCIUM 9.0 8.6*    COAG Lab Results  Component Value Date   INR 1.2 12/08/2022   INR 0.98 04/24/2017   No results found for: "PTT"   I have interviewed and examined patient with PA and agree with assessment and plan above.  Plan is for right lower extremity angiography on Monday and will make n.p.o. Sunday night.  Priscilla Kirstein C. Donzetta Matters, MD [facility] Office: [phone] Pager: [phone]

## 2022-12-16 NOTE — Progress Notes (Signed)
PROGRESS NOTE        PATIENT DETAILS Name: Tammy Mcpherson Age: 67 y.o. Sex: female Date of Birth: 09/30/1956 Admit Date: 12/13/2022 Admitting Physician Evalee Mutton Kristeen Mans, MD [id number], [place], [place]  Brief Summary: Patient is a 67 y.o.  female with known history of PAD-with chronic bilateral lower extremity ischemia, recent hospitalization for GI bleeding from AVMs-presenting with worsening gangrenous changes/rest pain involving left lower extremity.  Significant events: 3/13>> admit to [facility]  Significant studies: 3/13>> x-ray left foot: No osteomyelitis.  Significant microbiology data: 3/13>> blood culture: No growth  Procedures: 3/15>> left lower extremity angiogram with angioplasty stenting of left femoral-popliteal artery  Consults: Vascular surgery  Subjective: Thinks pain in LLE is better today.  Objective: Vitals: Blood pressure (!) 163/60, pulse (!) 104, temperature 97.7 F (36.5 C), temperature source Oral, resp. rate 20, height 5\' 3"  (1.6 m), weight 61.2 kg, SpO2 96 %.   Exam: Gen Exam:Alert awake-not in any distress HEENT:atraumatic, normocephalic Chest: B/L clear to auscultation anteriorly CVS:S1S2 regular Abdomen:soft non tender, non distended Extremities: Unchanged gangrenous changes to numerous toes of left foot. Neurology: Non focal Skin: no rash  Pertinent Labs/Radiology:    Latest Ref Rng & Units 12/16/2022    1:34 AM 12/15/2022    8:17 AM 12/14/2022    8:16 AM  CBC  WBC 4.0 - 10.5 K/uL 13.0  15.0  14.5   Hemoglobin 12.0 - 15.0 g/dL 9.2  10.3  10.1   Hematocrit 36.0 - 46.0 % 30.2  33.6  34.2   Platelets 150 - 400 K/uL 495  607  653     Lab Results  Component Value Date   NA 134 (L) 12/16/2022   K 3.2 (L) 12/16/2022   CL 98 12/16/2022   CO2 24 12/16/2022      Assessment/Plan: Critical left lower extremity ischemia with worsening rest pain Gangrenous changes to numerous toes of LLE Known bilateral lower  extremity PAD s/p multiple toe amputations in both foot/and angioplasty in the past S/p LLE angiogram 3/15 with stenting of femoral popliteal artery Continue DAPT/statin PT/OT Vascular surgery planning right lower extremity angiogram on Monday.   Recent GI bleeding due to gastrointestinal AVMs Per patient-brown stools Will continue PPI Follow Hb trend   Chronic combined HFpEF/HFrEF Euvolemic Continue Entresto   Hypokalemia Replete/recheck      HTN BP remains on the higher side Continue Entresto/Coreg Follow/reassess 3/17.   DM-2 Hold metformin Continue SSI  Recent Labs    12/15/22 1838 12/15/22 2044 12/16/22 0634  GLUCAP 95 101* 127*       COPD Continue bronchodilators   IBS-diarrhea predominant Supportive care   GERD PPI  BMI Estimated body mass index is 23.91 kg/m as calculated from the following:   Height as of this encounter: 5\' 3"  (1.6 m).   Weight as of this encounter: 61.2 kg.   Code status:   Code Status: Full Code   DVT Prophylaxis: enoxaparin (LOVENOX) injection 40 mg Start: 12/14/22 1000   Family Communication: None at bedside.   Disposition Plan: Status is: Inpatient Remains inpatient appropriate because: Severity of illness   Planned Discharge Destination:Home health   Diet: Diet Order             Diet Carb Modified Fluid consistency: Thin; Room service appropriate? Yes with Assist  Diet effective now  Antimicrobial agents: Anti-infectives (From admission, onward)    Start     Dose/Rate Route Frequency Ordered Stop   12/14/22 0400  clindamycin (CLEOCIN) IVPB 600 mg        600 mg 100 mL/hr over 30 Minutes Intravenous  Once 12/14/22 0337 12/14/22 0443        MEDICATIONS: Scheduled Meds:  aspirin  81 mg Oral q morning   atorvastatin  80 mg Oral QHS   carvedilol  6.25 mg Oral BID WC   cholecalciferol  2,000 Units Oral Daily   clopidogrel  75 mg Oral QHS   enoxaparin (LOVENOX) injection  40  mg Subcutaneous Daily   FLUoxetine  40 mg Oral Daily   gabapentin  300 mg Oral TID   insulin aspart  0-9 Units Subcutaneous TID WC   pantoprazole  40 mg Oral QHS   polyethylene glycol  17 g Oral Daily   potassium chloride  40 mEq Oral Q6H   sacubitril-valsartan  1 tablet Oral BID   sodium chloride flush  3 mL Intravenous Q12H   sodium chloride flush  3 mL Intravenous Q12H   Continuous Infusions:  sodium chloride     sodium chloride     sodium chloride     PRN Meds:.sodium chloride, sodium chloride, acetaminophen, albuterol, ALPRAZolam, cyclobenzaprine, fentaNYL (SUBLIMAZE) injection, hydrALAZINE, hydrALAZINE, labetalol, ondansetron (ZOFRAN) IV, ondansetron **OR** [DISCONTINUED] ondansetron (ZOFRAN) IV, oxyCODONE, sodium chloride flush, sodium chloride flush   I have personally reviewed following labs and imaging studies  LABORATORY DATA: CBC: Recent Labs  Lab 12/10/22 0434 12/13/22 1755 12/14/22 0816 12/15/22 0817 12/16/22 0134  WBC 24.4* 14.4* 14.5* 15.0* 13.0*  NEUTROABS  --  11.2*  --   --   --   HGB 9.0* 10.6* 10.1* 10.3* 9.2*  HCT 29.3* 36.7 34.2* 33.6* 30.2*  MCV 79.0* 83.0 84.2 82.6 82.3  PLT 669* 803* 653* 607* 495*     Basic Metabolic Panel: Recent Labs  Lab 12/10/22 1029 12/13/22 1755 12/14/22 0816 12/15/22 0817 12/16/22 0134  NA  --  137  --  135 134*  K 3.3* 3.1*  --  3.4* 3.2*  CL  --  101  --  97* 98  CO2  --  24  --  26 24  GLUCOSE  --  153*  --  98 96  BUN  --  11  --  10 9  CREATININE  --  1.11* 0.74 0.60 0.57  CALCIUM  --  9.6  --  9.0 8.6*     GFR: Estimated Creatinine Clearance: 58 mL/min (by C-G formula based on SCr of 0.57 mg/dL).  Liver Function Tests: Recent Labs  Lab 12/13/22 1755  AST 12*  ALT 10  ALKPHOS 125  BILITOT 0.2*  PROT 7.4  ALBUMIN 2.8*    No results for input(s): "LIPASE", "AMYLASE" in the last 168 hours. No results for input(s): "AMMONIA" in the last 168 hours.  Coagulation Profile: No results for  input(s): "INR", "PROTIME" in the last 168 hours.   Cardiac Enzymes: No results for input(s): "CKTOTAL", "CKMB", "CKMBINDEX", "TROPONINI" in the last 168 hours.  BNP (last 3 results) No results for input(s): "PROBNP" in the last 8760 hours.  Lipid Profile: Recent Labs    12/16/22 0134  CHOL 79  HDL 24*  LDLCALC 29  TRIG 129  CHOLHDL 3.3    Thyroid Function Tests: No results for input(s): "TSH", "T4TOTAL", "FREET4", "T3FREE", "THYROIDAB" in the last 72 hours.  Anemia Panel: No results for input(s): "  VITAMINB12", "FOLATE", "FERRITIN", "TIBC", "IRON", "RETICCTPCT" in the last 72 hours.  Urine analysis:    Component Value Date/Time   COLORURINE STRAW (A) 10/26/2022 1540   APPEARANCEUR CLEAR 10/26/2022 1540   LABSPEC 1.004 (L) 10/26/2022 1540   PHURINE 6.0 10/26/2022 1540   GLUCOSEU NEGATIVE 10/26/2022 1540   GLUCOSEU NEGATIVE 04/26/2022 1508   HGBUR SMALL (A) 10/26/2022 1540   BILIRUBINUR NEGATIVE 10/26/2022 1540   BILIRUBINUR negative 06/17/2020 1534   KETONESUR NEGATIVE 10/26/2022 1540   PROTEINUR NEGATIVE 10/26/2022 1540   UROBILINOGEN 0.2 04/26/2022 1508   NITRITE NEGATIVE 10/26/2022 1540   LEUKOCYTESUR TRACE (A) 10/26/2022 1540    Sepsis Labs: Lactic Acid, Venous    Component Value Date/Time   LATICACIDVEN 1.6 12/13/2022 1758    MICROBIOLOGY: Recent Results (from the past 240 hour(s))  Blood culture (routine x 2)     Status: None (Preliminary result)   Collection Time: 12/13/22  5:58 PM   Specimen: BLOOD LEFT FOREARM  Result Value Ref Range Status   Specimen Description BLOOD LEFT FOREARM  Final   Special Requests   Final    BOTTLES DRAWN AEROBIC AND ANAEROBIC Blood Culture results may not be optimal due to an excessive volume of blood received in culture bottles   Culture   Final    NO GROWTH 3 DAYS Performed at [facility], [place] [address], [place], [place] [address]    Report Status PENDING  Incomplete    RADIOLOGY  STUDIES/RESULTS: PERIPHERAL VASCULAR CATHETERIZATION  Result Date: 12/15/2022 DATE OF SERVICE: 12/15/2022  PATIENT:  Suszanne Finch  67 y.o. female  PRE-OPERATIVE DIAGNOSIS:  Atherosclerosis of native arteries of bilateral lower extremities causing gangrene  POST-OPERATIVE DIAGNOSIS:  Same  PROCEDURE:  1) Ultrasound guided right common femoral artery access 2) Aortogram 3) Left lower extremity angiogram with third order cannulation (143mL total contrast) 4) Left femoropopliteal angioplasty and stenting (5x158mm, 5x131mm, 5x2mm Zilver) 5) Right lower extremity angiogram 6) Conscious sedation (47 minutes)  SURGEON:  Yevonne Aline. Stanford Breed, MD  ASSISTANT: none  ANESTHESIA:   local and IV sedation  ESTIMATED BLOOD LOSS: minimal  LOCAL MEDICATIONS USED:  LIDOCAINE  COUNTS: confirmed correct.  PATIENT DISPOSITION:  PACU - hemodynamically stable.  Delay start of Pharmacological VTE agent (>24hrs) due to surgical blood loss or risk of bleeding: no  INDICATION FOR PROCEDURE: EMILIN MAPES is a 67 y.o. female with bilateral lower extremity gangrene. After careful discussion of risks, benefits, and alternatives the patient was offered angiography. The patient understood and wished to proceed.  OPERATIVE FINDINGS: Globally diminutive arteries Healthy femoropopliteal vessels measuring ~32mm Tibial vessels measuring <73mm  Terminal aorta and iliac arteries: Patent  Right lower extremity: Common femoral artery: patent Profunda femoris artery: patent Superficial femoral artery: diffuse disease; multifocal severe stenosis greatest 70%; existing femoropopliteal stenting patent but with instent stenosis ~ 40%. Popliteal artery: patent above, behind and below knee Anterior tibial artery: patent Tibioperoneal trunk: patent Peroneal artery: patent Posterior tibial artery: patent Pedal circulation: disadvantaged  Left lower extremity: Common femoral artery: patent Profunda femoris artery: patent Superficial femoral artery: diffuse disease;  proximal significant stenosis ~60%. Chronic total occlusion about Hunter's canal about 16mm with severe disease in reconstituted above knee popliteal artery. Popliteal artery: Remainder of popliteal artery without significant stenosis. Anterior tibial artery: patent Tibioperoneal trunk: patent Peroneal artery: patent Posterior tibial artery: patent Pedal circulation: disadvantaged  GLASS score. FP 3. IP 0. Stage II.  WIfI score. 2 / 3 /1. Stage IV.  DESCRIPTION  OF PROCEDURE: After identification of the patient in the pre-operative holding area, the patient was transferred to the operating room. The patient was positioned supine on the operating room table.  Anesthesia was induced. The groins was prepped and draped in standard fashion. A surgical pause was performed confirming correct patient, procedure, and operative location.  The right groin was anesthetized with subcutaneous injection of 1% lidocaine. Using ultrasound guidance, the right common femoral artery was accessed with micropuncture technique. Fluoroscopy was used to confirm cannulation over the femoral head. The 22F sheath was upsized to 2F.  A Benson wire was advanced into the distal aorta. Over the wire an omni flush catheter was advanced to the level of L2. Aortogram was performed - see above for details.  The left common iliac artery was selected with an omniflush catheter and glidewire advantage guidewire. The wire was advanced into the common femoral artery. Over the wire the omni flush catheter was advanced into the external iliac artery. Selective angiography was performed - see above for details.  The decision was made to intervene. The patient was heparinized with 6000 units of heparin. The 2F sheath was exchanged for a 65F x 45cm sheath. Selective angiography of the left lower extremity was performed prior to intervention.  The lesions were treated with: Left femoropopliteal angioplasty and stenting (5x133mm, 5x121mm, 5x29mm Zilver)  Completion  angiography revealed: Resolution of L SFA / popliteal stenosis and occlusion.  A perclose device was used to close the arteriotomy. Hemostasis was adequate upon completion.  Conscious sedation was administered with the use of IV fentanyl and midazolam under continuous physician and nurse monitoring.  Heart rate, blood pressure, and oxygen saturation were continuously monitored.  Total sedation time was 47 minutes  Upon completion of the case instrument and sharps counts were confirmed correct. The patient was transferred to the PACU in good condition. I was present for all portions of the procedure.  PLAN: ASA 81mg  PO QD. Plavix 75mg  PO QD. High intensity statin therapy. Plan angiography Monday with focus on R leg.  Yevonne Aline. Stanford Breed, MD [facility] Phone Number: [phone] 12/15/2022 10:51 AM      LOS: 2 days   Oren Binet, MD  [facility]    To contact the attending provider between 7A-7P or the covering provider during after hours 7P-7A, please log into the web site [email or web address] and access using universal [place] password for that web site. If you do not have the password, please call the hospital operator.  12/16/2022, 10:49 AM

## 2022-12-16 NOTE — Evaluation (Signed)
Physical Therapy Evaluation Patient Details Name: Tammy Mcpherson MRN: [id number] DOB: 11-05-1955 Today's Date: 12/16/2022  History of Present Illness  Pt is a 67 y/o F presenting to ED on 3/13 from PCP for L foot gangrene, s/p LLE angiogram femoropopliteal angoiplasty and stenting. Plan for RLE revascularization 3/18. PMH includes HTN, DM2, COPD, recent hospitalization for GI bleed from AVMs, PAD with chronic LLE ischemia.   Clinical Impression  Pt in bed upon arrival of PT, agreeable to evaluation at this time. Prior to admission the pt was ambulating with assist from her spouse for mobility in the home or using RW.  The pt now presents with limitations in functional mobility, dynamic stability, and endurance due to above dx, and will continue to benefit from skilled PT to address these deficits. She required minA of 1-2 to manage initial sit-stand transfers, pivot to [facility] and short-distance ambulation in the room. The pt was limited by pain and fatigue, will continue to benefit from skilled PT to progress independence with mobility prior to d/c.   Given limitations, would traditionally recommend HHPT, however, spouse present and requesting OPPT, states he feels the pt is moving better now than prior to admission and he was able to transport her to MD appointments without issue so feels he will also be able to transport her safely at this time.       Recommendations for follow up therapy are one component of a multi-disciplinary discharge planning process, led by the attending physician.  Recommendations may be updated based on patient status, additional functional criteria and insurance authorization.  Follow Up Recommendations Outpatient PT (per spouse request)      Assistance Recommended at Discharge Frequent or constant Supervision/Assistance  Patient can return home with the following  A little help with walking and/or transfers;A little help with bathing/dressing/bathroom;Assistance with  cooking/housework;Assist for transportation;Help with stairs or ramp for entrance    Equipment Recommendations Rolling walker (2 wheels)  Recommendations for Other Services       Functional Status Assessment Patient has had a recent decline in their functional status and demonstrates the ability to make significant improvements in function in a reasonable and predictable amount of time.     Precautions / Restrictions Precautions Precautions: Fall Restrictions Weight Bearing Restrictions: No      Mobility  Bed Mobility Overal bed mobility: Needs Assistance Bed Mobility: Supine to Sit     Supine to sit: Min assist, HOB elevated     General bed mobility comments: HOB elevated, minA to complete movement. Pt attempted without O2 and SpO2 to low of 82%, 2L O2 returned.    Transfers Overall transfer level: Needs assistance Equipment used: Rolling walker (2 wheels) Transfers: Sit to/from Stand, Bed to chair/wheelchair/BSC Sit to Stand: Min assist, +2 physical assistance Stand pivot transfers: Min assist         General transfer comment: urgent pivot due to need to use BSC, pt then able to complete multiple sit-stand with minA of 2 and cues for hand placement.    Ambulation/Gait Ambulation/Gait assistance: Min assist, +2 safety/equipment Gait Distance (Feet): 15 Feet Assistive device: Rolling walker (2 wheels) Gait Pattern/deviations: Step-to pattern, Decreased stride length, Trunk flexed Gait velocity: decreased Gait velocity interpretation: <1.31 ft/sec, indicative of household ambulator   General Gait Details: dependent on BUE support and external support from therapists. fatigues quickly. SpO2 stable on 2L      Balance Overall balance assessment: Needs assistance Sitting-balance support: No upper extremity supported Sitting balance-Leahy Scale: Fair  Standing balance support: Bilateral upper extremity supported, During functional activity, Reliant on  assistive device for balance Standing balance-Leahy Scale: Poor                               Pertinent Vitals/Pain Pain Assessment Pain Assessment: Faces Faces Pain Scale: Hurts little more Pain Location: feet Pain Descriptors / Indicators: Discomfort Pain Intervention(s): Limited activity within patient's tolerance, Monitored during session, Repositioned    Home Living Family/patient expects to be discharged to:: Private residence Living Arrangements: Spouse/significant other Available Help at Discharge: Family;Available 24 hours/day Type of Home: House Home Access: Stairs to enter   [employer] of Steps: 1 small threshold   Home Layout: One level Home Equipment: Transport chair;BSC/3in1;Shower seat;Grab bars - tub/shower;Hand held shower head      Prior Function Prior Level of Function : Needs assist             Mobility Comments: spouse had been providing HHA to ambulate in house. ADLs Comments: independent with ADLs, spouse assists with mobility     Hand Dominance        Extremity/Trunk Assessment   Upper Extremity Assessment Upper Extremity Assessment: Defer to OT evaluation    Lower Extremity Assessment Lower Extremity Assessment: Generalized weakness (multiple toe amputations bilaterally with multiple non-healing wounds, especially on R foot. no focal weakness to MMT)    Cervical / Trunk Assessment Cervical / Trunk Assessment: Kyphotic  Communication   Communication: No difficulties  Cognition Arousal/Alertness: Awake/alert Behavior During Therapy: Flat affect Overall Cognitive Status: Impaired/Different from baseline Area of Impairment: Attention, Safety/judgement, Problem solving                   Current Attention Level: Sustained     Safety/Judgement: Decreased awareness of safety   Problem Solving: Slow processing, Decreased initiation, Difficulty sequencing, Requires verbal cues General Comments: pt with  slowed responses but able to express needs and follow simple cues. needs cues for safety        General Comments General comments (skin integrity, edema, etc.): Spo2 to low of 82% on RA, returned to 90s with 2L re-applied. Maintained 89-94% on 2L with mobility.        Assessment/Plan    PT Assessment Patient needs continued PT services  PT Problem List Decreased strength;Decreased activity tolerance;Decreased mobility;Decreased balance       PT Treatment Interventions DME instruction;Gait training;Stair training;Functional mobility training;Therapeutic activities;Therapeutic exercise;Balance training;Patient/family education    PT Goals (Current goals can be found in the Care Plan section)  Acute Rehab PT Goals Patient Stated Goal: return home PT Goal Formulation: With patient Time For Goal Achievement: 12/30/22 Potential to Achieve Goals: Good    Frequency Min 3X/week     Co-evaluation PT/OT/SLP Co-Evaluation/Treatment: Yes Reason for Co-Treatment: Complexity of the patient's impairments (multi-system involvement);To address functional/ADL transfers;For patient/therapist safety PT goals addressed during session: Mobility/safety with mobility;Balance;Proper use of DME;Strengthening/ROM OT goals addressed during session: ADL's and self-care       AM-PAC PT "6 Clicks" Mobility  Outcome Measure Help needed turning from your back to your side while in a flat bed without using bedrails?: A Little Help needed moving from lying on your back to sitting on the side of a flat bed without using bedrails?: A Little Help needed moving to and from a bed to a chair (including a wheelchair)?: A Little Help needed standing up from a chair using your arms (e.g., wheelchair  or bedside chair)?: A Lot Help needed to walk in hospital room?: Total (<20 ft) Help needed climbing 3-5 steps with a railing? : Total 6 Click Score: 13    End of Session Equipment Utilized During Treatment: Gait  belt;Oxygen Activity Tolerance: Patient tolerated treatment well Patient left: in chair;with call bell/phone within reach;with chair alarm set;with family/visitor present Nurse Communication: Mobility status PT Visit Diagnosis: Other abnormalities of gait and mobility (R26.89);Muscle weakness (generalized) (M62.81)    Time: [id number] PT Time Calculation (min) (ACUTE ONLY): 32 min   Charges:   PT Evaluation $PT Eval Low Complexity: 1 Low          West Carbo, PT, DPT   Acute Rehabilitation Department Office [phone] Secure Chat Communication Preferred  Sandra Cockayne 12/16/2022, 3:41 PM

## 2022-12-16 NOTE — Progress Notes (Signed)
PHARMACIST LIPID MONITORING   Tammy Mcpherson is a 67 y.o. female admitted on 12/13/2022 with bilateral lower extremity chronic limb threatening ischemia.  Pharmacy has been consulted to optimize lipid-lowering therapy with the indication of secondary prevention for clinical ASCVD.  Recent Labs:  Lipid Panel (last 6 months):   Lab Results  Component Value Date   CHOL 79 12/16/2022   TRIG 129 12/16/2022   HDL 24 (L) 12/16/2022   CHOLHDL 3.3 12/16/2022   VLDL 26 12/16/2022   LDLCALC 29 12/16/2022   LDLDIRECT 64.0 10/25/2022    Hepatic function panel (last 6 months):   Lab Results  Component Value Date   AST 12 (L) 12/13/2022   ALT 10 12/13/2022   ALKPHOS 125 12/13/2022   BILITOT 0.2 (L) 12/13/2022    SCr (since admission):   Serum creatinine: 0.57 mg/dL 12/16/22 0134 Estimated creatinine clearance: 58 mL/min  Current therapy and lipid therapy tolerance Current lipid-lowering therapy: atorvastatin 80 mg  Previous lipid-lowering therapies (if applicable): rosuvastatin  Documented or reported allergies or intolerances to lipid-lowering therapies (if applicable): n/a  Assessment:   Patient appropriate to continue lipid lowering therapy.   Plan:    1.Statin intensity (high intensity recommended for all patients regardless of the LDL):  No statin changes. The patient is already on a high intensity statin.  2.Add ezetimibe (if any one of the following):   Not indicated at this time.  3.Refer to lipid clinic:   No  4.Follow-up with:  Primary care provider - Inda Coke, [place]  5.Follow-up labs after discharge:  Changes in lipid therapy were made. Check a lipid panel in 8-12 weeks then annually.       Gena Fray, PharmD PGY1 Pharmacy Resident   12/16/2022 8:09 AM

## 2022-12-17 DIAGNOSIS — E1169 Type 2 diabetes mellitus with other specified complication: Secondary | ICD-10-CM | POA: Diagnosis not present

## 2022-12-17 DIAGNOSIS — I10 Essential (primary) hypertension: Secondary | ICD-10-CM | POA: Diagnosis not present

## 2022-12-17 DIAGNOSIS — E876 Hypokalemia: Secondary | ICD-10-CM | POA: Diagnosis not present

## 2022-12-17 DIAGNOSIS — I998 Other disorder of circulatory system: Secondary | ICD-10-CM | POA: Diagnosis not present

## 2022-12-17 LAB — BASIC METABOLIC PANEL
Anion gap: 8 (ref 5–15)
BUN: 11 mg/dL (ref 8–23)
CO2: 27 mmol/L (ref 22–32)
Calcium: 9.2 mg/dL (ref 8.9–10.3)
Chloride: 103 mmol/L (ref 98–111)
Creatinine, Ser: 0.61 mg/dL (ref 0.44–1.00)
GFR, Estimated: >60 mL/min (ref >60)
Glucose, Bld: 85 mg/dL (ref 70–99)
Potassium: 4.9 mmol/L (ref 3.5–5.1)
Sodium: 138 mmol/L (ref 135–145)

## 2022-12-17 LAB — CBC
HCT: 32 % — ABNORMAL LOW (ref 36.0–46.0)
Hemoglobin: 9.1 g/dL — ABNORMAL LOW (ref 12.0–15.0)
MCH: 24.3 pg — ABNORMAL LOW (ref 26.0–34.0)
MCHC: 28.4 g/dL — ABNORMAL LOW (ref 30.0–36.0)
MCV: 85.6 fL (ref 80.0–100.0)
Platelets: 474 10*3/uL — ABNORMAL HIGH (ref 150–400)
RBC: 3.74 MIL/uL — ABNORMAL LOW (ref 3.87–5.11)
RDW: 23.5 % — ABNORMAL HIGH (ref 11.5–15.5)
WBC: 13.8 10*3/uL — ABNORMAL HIGH (ref 4.0–10.5)
nRBC: 0 % (ref 0.0–0.2)

## 2022-12-17 LAB — GLUCOSE, CAPILLARY
Glucose-Capillary: 89 mg/dL (ref 70–99)
Glucose-Capillary: 89 mg/dL (ref 70–99)
Glucose-Capillary: 142 mg/dL — ABNORMAL HIGH (ref 70–99)
Glucose-Capillary: 98 mg/dL (ref 70–99)

## 2022-12-17 NOTE — Progress Notes (Signed)
PROGRESS NOTE        PATIENT DETAILS Name: Tammy Mcpherson Age: 67 y.o. Sex: female Date of Birth: Apr 08, 1956 Admit Date: 12/13/2022 Admitting Physician Evalee Mutton Kristeen Mans, MD [id number], [place], [place]  Brief Summary: Patient is a 66 y.o.  female with known history of PAD-with chronic bilateral lower extremity ischemia, recent hospitalization for GI bleeding from AVMs-presenting with worsening gangrenous changes/rest pain involving left lower extremity.  Significant events: 3/13>> admit to [facility]  Significant studies: 3/13>> x-ray left foot: No osteomyelitis.  Significant microbiology data: 3/13>> blood culture: No growth  Procedures: 3/15>> left lower extremity angiogram with angioplasty stenting of left femoral-popliteal artery  Consults: Vascular surgery  Subjective: Seems frustrated-does not understand why she underwent PCI-and her gangrene is toes are unchanged.  Asking multiple questions about why angioplasty was not done when she was having a GI bleed.  Still having rest pain in her left lower extremity.   Objective: Vitals: Blood pressure 125/65, pulse 97, temperature 97.8 F (36.6 C), temperature source Oral, resp. rate 17, height 5\' 3"  (1.6 m), weight 56.5 kg, SpO2 94 %.   Exam: Gen Exam:Alert awake-not in any distress HEENT:atraumatic, normocephalic Chest: B/L clear to auscultation anteriorly CVS:S1S2 regular Abdomen:soft non tender, non distended Extremities:no edema-unchanged gangrenous toes of LLE. Neurology: Non focal Skin: no rash  Pertinent Labs/Radiology:    Latest Ref Rng & Units 12/17/2022    1:24 AM 12/16/2022    1:34 AM 12/15/2022    8:17 AM  CBC  WBC 4.0 - 10.5 K/uL 13.8  13.0  15.0   Hemoglobin 12.0 - 15.0 g/dL 9.1  9.2  10.3   Hematocrit 36.0 - 46.0 % 32.0  30.2  33.6   Platelets 150 - 400 K/uL 474  495  607     Lab Results  Component Value Date   NA 138 12/17/2022   K 4.9 12/17/2022   CL 103 12/17/2022   CO2 27  12/17/2022      Assessment/Plan: Critical left lower extremity ischemia with worsening rest pain Gangrenous changes to numerous toes of LLE Known bilateral lower extremity PAD s/p multiple toe amputations in both foot/and angioplasty in the past S/p LLE angiogram 3/15 with stenting of femoral popliteal artery Continue DAPT/statin Vascular surgery planning RLE angiogram 3/18 Will discuss with vascular surgery regarding if patient needs amputation of the toes of her LLE this admission. Continue PT/OT.   Recent GI bleeding due to gastrointestinal AVMs Per patient-brown stools Will continue PPI Follow Hb trend   Chronic combined HFpEF/HFrEF Euvolemic Continue Entresto   Hypokalemia Replete/recheck      HTN BP remains on the higher side Continue Entresto/Coreg Follow/reassess 3/17.   DM-2 Hold metformin Continue SSI  Recent Labs    12/16/22 1635 12/16/22 2044 12/17/22 0730  GLUCAP 116* 84 89       COPD Continue bronchodilators   IBS-diarrhea predominant Supportive care   GERD PPI  BMI Estimated body mass index is 22.06 kg/m as calculated from the following:   Height as of this encounter: 5\' 3"  (1.6 m).   Weight as of this encounter: 56.5 kg.   Code status:   Code Status: Full Code   DVT Prophylaxis: enoxaparin (LOVENOX) injection 40 mg Start: 12/14/22 1000   Family Communication: None at bedside.   Disposition Plan: Status is: Inpatient Remains inpatient appropriate because: Severity of illness  Planned Discharge Destination:Home health   Diet: Diet Order             Diet NPO time specified Except for: Sips with Meds  Diet effective midnight           Diet Carb Modified Fluid consistency: Thin; Room service appropriate? Yes with Assist  Diet effective now                     Antimicrobial agents: Anti-infectives (From admission, onward)    Start     Dose/Rate Route Frequency Ordered Stop   12/14/22 0400  clindamycin (CLEOCIN)  IVPB 600 mg        600 mg 100 mL/hr over 30 Minutes Intravenous  Once 12/14/22 0337 12/14/22 0443        MEDICATIONS: Scheduled Meds:  aspirin  81 mg Oral q morning   atorvastatin  80 mg Oral QHS   carvedilol  6.25 mg Oral BID WC   cholecalciferol  2,000 Units Oral Daily   clopidogrel  75 mg Oral QHS   enoxaparin (LOVENOX) injection  40 mg Subcutaneous Daily   FLUoxetine  40 mg Oral Daily   gabapentin  300 mg Oral TID   insulin aspart  0-9 Units Subcutaneous TID WC   pantoprazole  40 mg Oral QHS   polyethylene glycol  17 g Oral Daily   sacubitril-valsartan  1 tablet Oral BID   sodium chloride flush  3 mL Intravenous Q12H   sodium chloride flush  3 mL Intravenous Q12H   Continuous Infusions:  sodium chloride     sodium chloride     PRN Meds:.sodium chloride, sodium chloride, acetaminophen, albuterol, ALPRAZolam, cyclobenzaprine, fentaNYL (SUBLIMAZE) injection, hydrALAZINE, hydrALAZINE, labetalol, ondansetron (ZOFRAN) IV, ondansetron **OR** [DISCONTINUED] ondansetron (ZOFRAN) IV, oxyCODONE, sodium chloride flush, sodium chloride flush   I have personally reviewed following labs and imaging studies  LABORATORY DATA: CBC: Recent Labs  Lab 12/13/22 1755 12/14/22 0816 12/15/22 0817 12/16/22 0134 12/17/22 0124  WBC 14.4* 14.5* 15.0* 13.0* 13.8*  NEUTROABS 11.2*  --   --   --   --   HGB 10.6* 10.1* 10.3* 9.2* 9.1*  HCT 36.7 34.2* 33.6* 30.2* 32.0*  MCV 83.0 84.2 82.6 82.3 85.6  PLT 803* 653* 607* 495* 474*     Basic Metabolic Panel: Recent Labs  Lab 12/10/22 1029 12/13/22 1755 12/14/22 0816 12/15/22 0817 12/16/22 0134 12/17/22 0124  NA  --  137  --  135 134* 138  K 3.3* 3.1*  --  3.4* 3.2* 4.9  CL  --  101  --  97* 98 103  CO2  --  24  --  26 24 27   GLUCOSE  --  153*  --  98 96 85  BUN  --  11  --  10 9 11   CREATININE  --  1.11* 0.74 0.60 0.57 0.61  CALCIUM  --  9.6  --  9.0 8.6* 9.2     GFR: Estimated Creatinine Clearance: 58 mL/min (by C-G formula  based on SCr of 0.61 mg/dL).  Liver Function Tests: Recent Labs  Lab 12/13/22 1755  AST 12*  ALT 10  ALKPHOS 125  BILITOT 0.2*  PROT 7.4  ALBUMIN 2.8*    No results for input(s): "LIPASE", "AMYLASE" in the last 168 hours. No results for input(s): "AMMONIA" in the last 168 hours.  Coagulation Profile: No results for input(s): "INR", "PROTIME" in the last 168 hours.   Cardiac Enzymes: No results for input(s): "CKTOTAL", "  CKMB", "CKMBINDEX", "TROPONINI" in the last 168 hours.  BNP (last 3 results) No results for input(s): "PROBNP" in the last 8760 hours.  Lipid Profile: Recent Labs    12/16/22 0134  CHOL 79  HDL 24*  LDLCALC 29  TRIG 129  CHOLHDL 3.3     Thyroid Function Tests: No results for input(s): "TSH", "T4TOTAL", "FREET4", "T3FREE", "THYROIDAB" in the last 72 hours.  Anemia Panel: No results for input(s): "VITAMINB12", "FOLATE", "FERRITIN", "TIBC", "IRON", "RETICCTPCT" in the last 72 hours.  Urine analysis:    Component Value Date/Time   COLORURINE STRAW (A) 10/26/2022 1540   APPEARANCEUR CLEAR 10/26/2022 1540   LABSPEC 1.004 (L) 10/26/2022 1540   PHURINE 6.0 10/26/2022 1540   GLUCOSEU NEGATIVE 10/26/2022 1540   GLUCOSEU NEGATIVE 04/26/2022 1508   HGBUR SMALL (A) 10/26/2022 1540   BILIRUBINUR NEGATIVE 10/26/2022 1540   BILIRUBINUR negative 06/17/2020 1534   KETONESUR NEGATIVE 10/26/2022 1540   PROTEINUR NEGATIVE 10/26/2022 1540   UROBILINOGEN 0.2 04/26/2022 1508   NITRITE NEGATIVE 10/26/2022 1540   LEUKOCYTESUR TRACE (A) 10/26/2022 1540    Sepsis Labs: Lactic Acid, Venous    Component Value Date/Time   LATICACIDVEN 1.6 12/13/2022 1758    MICROBIOLOGY: Recent Results (from the past 240 hour(s))  Blood culture (routine x 2)     Status: None (Preliminary result)   Collection Time: 12/13/22  5:58 PM   Specimen: BLOOD LEFT FOREARM  Result Value Ref Range Status   Specimen Description BLOOD LEFT FOREARM  Final   Special Requests   Final     BOTTLES DRAWN AEROBIC AND ANAEROBIC Blood Culture results may not be optimal due to an excessive volume of blood received in culture bottles   Culture   Final    NO GROWTH 4 DAYS Performed at [facility], [place] [address], [name], [place] [address]    Report Status PENDING  Incomplete    RADIOLOGY STUDIES/RESULTS: PERIPHERAL VASCULAR CATHETERIZATION  Result Date: 12/15/2022 DATE OF SERVICE: 12/15/2022  PATIENT:  Tammy Mcpherson  67 y.o. female  PRE-OPERATIVE DIAGNOSIS:  Atherosclerosis of native arteries of bilateral lower extremities causing gangrene  POST-OPERATIVE DIAGNOSIS:  Same  PROCEDURE:  1) Ultrasound guided right common femoral artery access 2) Aortogram 3) Left lower extremity angiogram with third order cannulation (139mL total contrast) 4) Left femoropopliteal angioplasty and stenting (5x164mm, 5x125mm, 5x7mm Zilver) 5) Right lower extremity angiogram 6) Conscious sedation (47 minutes)  SURGEON:  Yevonne Aline. Stanford Breed, MD  ASSISTANT: none  ANESTHESIA:   local and IV sedation  ESTIMATED BLOOD LOSS: minimal  LOCAL MEDICATIONS USED:  LIDOCAINE  COUNTS: confirmed correct.  PATIENT DISPOSITION:  PACU - hemodynamically stable.  Delay start of Pharmacological VTE agent (>24hrs) due to surgical blood loss or risk of bleeding: no  INDICATION FOR PROCEDURE: AMIT CEASE is a 67 y.o. female with bilateral lower extremity gangrene. After careful discussion of risks, benefits, and alternatives the patient was offered angiography. The patient understood and wished to proceed.  OPERATIVE FINDINGS: Globally diminutive arteries Healthy femoropopliteal vessels measuring ~63mm Tibial vessels measuring <6mm  Terminal aorta and iliac arteries: Patent  Right lower extremity: Common femoral artery: patent Profunda femoris artery: patent Superficial femoral artery: diffuse disease; multifocal severe stenosis greatest 70%; existing femoropopliteal stenting patent but with instent stenosis ~ 40%. Popliteal artery:  patent above, behind and below knee Anterior tibial artery: patent Tibioperoneal trunk: patent Peroneal artery: patent Posterior tibial artery: patent Pedal circulation: disadvantaged  Left lower extremity: Common femoral artery: patent  Profunda femoris artery: patent Superficial femoral artery: diffuse disease; proximal significant stenosis ~60%. Chronic total occlusion about Hunter's canal about 52mm with severe disease in reconstituted above knee popliteal artery. Popliteal artery: Remainder of popliteal artery without significant stenosis. Anterior tibial artery: patent Tibioperoneal trunk: patent Peroneal artery: patent Posterior tibial artery: patent Pedal circulation: disadvantaged  GLASS score. FP 3. IP 0. Stage II.  WIfI score. 2 / 3 /1. Stage IV.  DESCRIPTION OF PROCEDURE: After identification of the patient in the pre-operative holding area, the patient was transferred to the operating room. The patient was positioned supine on the operating room table.  Anesthesia was induced. The groins was prepped and draped in standard fashion. A surgical pause was performed confirming correct patient, procedure, and operative location.  The right groin was anesthetized with subcutaneous injection of 1% lidocaine. Using ultrasound guidance, the right common femoral artery was accessed with micropuncture technique. Fluoroscopy was used to confirm cannulation over the femoral head. The 21F sheath was upsized to 81F.  A Benson wire was advanced into the distal aorta. Over the wire an omni flush catheter was advanced to the level of L2. Aortogram was performed - see above for details.  The left common iliac artery was selected with an omniflush catheter and glidewire advantage guidewire. The wire was advanced into the common femoral artery. Over the wire the omni flush catheter was advanced into the external iliac artery. Selective angiography was performed - see above for details.  The decision was made to intervene. The  patient was heparinized with 6000 units of heparin. The 81F sheath was exchanged for a 46F x 45cm sheath. Selective angiography of the left lower extremity was performed prior to intervention.  The lesions were treated with: Left femoropopliteal angioplasty and stenting (5x141mm, 5x160mm, 5x24mm Zilver)  Completion angiography revealed: Resolution of L SFA / popliteal stenosis and occlusion.  A perclose device was used to close the arteriotomy. Hemostasis was adequate upon completion.  Conscious sedation was administered with the use of IV fentanyl and midazolam under continuous physician and nurse monitoring.  Heart rate, blood pressure, and oxygen saturation were continuously monitored.  Total sedation time was 47 minutes  Upon completion of the case instrument and sharps counts were confirmed correct. The patient was transferred to the PACU in good condition. I was present for all portions of the procedure.  PLAN: ASA 81mg  PO QD. Plavix 75mg  PO QD. High intensity statin therapy. Plan angiography Monday with focus on R leg.  Yevonne Aline. Stanford Breed, MD [facility] [place] [facility] Phone Number: [phone] 12/15/2022 10:51 AM      LOS: 3 days   Oren Binet, MD  [facility]    To contact the attending provider between 7A-7P or the covering provider during after hours 7P-7A, please log into the web site [email or web address] and access using universal [place] password for that web site. If you do not have the password, please call the hospital operator.  12/17/2022, 8:43 AM

## 2022-12-17 NOTE — Progress Notes (Addendum)
   [facility] [place]  Subjective  - She is still having left foot pain and wants to discuss options.   She is very frustrated.  She stated if we had to take both her legs off to get her home that what she will do.  She states she just sits in her room all day anyway.     Objective 125/65 97 97.8 F (36.6 C) (Oral) 17 94%  Intake/Output Summary (Last 24 hours) at 12/17/2022 0920 Last data filed at 12/17/2022 [id number] Gross per 24 hour  Intake 360 ml  Output 500 ml  Net -140 ml    Left Doppler signal AT/PT erythema in the foot with dry gangrene toes. Right Weak DP, without erythema.  No change in third toe right non healing wound. Lungs non labored breathing   Assessment/Planning: B LE wounds Left CLI with dry gangrene of the toes and rest pain   POD # 2 left LE endovascular intervention with  left femoropopliteal angioplasty and stenting  She is still having pain and wants to discuss amputation options.  TMA verses BKA on the left.  She does not think she was to proceed  with angiogram on the right.  She states it's not in pain and she just want to get home.    WBC leukocytosis with erythema of the left foot.  3/13>> blood culture: No growth. NPO for PVL with Dr. Scot Dock.  We will let Dr. Stanford Breed know she would like to talk with him tomorrow to discuss her options and get her home sooner than later.   Roxy Horseman 12/17/2022 9:20 AM --  Laboratory Lab Results: Recent Labs    12/16/22 0134 12/17/22 0124  WBC 13.0* 13.8*  HGB 9.2* 9.1*  HCT 30.2* 32.0*  PLT 495* 474*   BMET Recent Labs    12/16/22 0134 12/17/22 0124  NA 134* 138  K 3.2* 4.9  CL 98 103  CO2 24 27  GLUCOSE 96 85  BUN 9 11  CREATININE 0.57 0.61  CALCIUM 8.6* 9.2    COAG Lab Results  Component Value Date   INR 1.2 12/08/2022   INR 0.98 04/24/2017   No results found for: "PTT"     Plan for return to the PV Lab for right LE study and possible  intervention of the right LE with Dr. Scot Dock.   NPO past MN  Roxy Horseman PA-C  I have independently interviewed and examined patient and agree with PA assessment and plan above.  Today stating she just wants to have both of her legs cut off.  We discussed that this time she has no indication to remove the right leg and the left leg actually has much improved blood flow with very strong distal PT and DP signals traced far out on the foot possibly could heal a transmetatarsal amputation but certainly below-knee amputation has high likelihood and not be unreasonable to consider.  I did recommend proceeding with angiography to treat the right lower extremity tomorrow given that this would be her only method of pivoting and standing but at this time she is not interested in discussing.  I have recommended that we keep her on the schedule for angiography tomorrow realizing that she may cancel and she can have further discussion with Dr. Stanford Breed regarding amputations.  Kaytlan Behrman C. Donzetta Matters, MD [facility] [place] Office: [phone] Pager: [phone]

## 2022-12-17 NOTE — H&P (View-Only) (Signed)
   [facility] [place]  Subjective  - She is still having left foot pain and wants to discuss options.   She is very frustrated.  She stated if we had to take both her legs off to get her home that what she will do.  She states she just sits in her room all day anyway.     Objective 125/65 97 97.8 F (36.6 C) (Oral) 17 94%  Intake/Output Summary (Last 24 hours) at 12/17/2022 0920 Last data filed at 12/17/2022 [id number] Gross per 24 hour  Intake 360 ml  Output 500 ml  Net -140 ml    Left Doppler signal AT/PT erythema in the foot with dry gangrene toes. Right Weak DP, without erythema.  No change in third toe right non healing wound. Lungs non labored breathing   Assessment/Planning: B LE wounds Left CLI with dry gangrene of the toes and rest pain   POD # 2 left LE endovascular intervention with  left femoropopliteal angioplasty and stenting  She is still having pain and wants to discuss amputation options.  TMA verses BKA on the left.  She does not think she was to proceed  with angiogram on the right.  She states it's not in pain and she just want to get home.    WBC leukocytosis with erythema of the left foot.  3/13>> blood culture: No growth. NPO for PVL with Dr. Scot Dock.  We will let Dr. Stanford Breed know she would like to talk with him tomorrow to discuss her options and get her home sooner than later.   Roxy Horseman 12/17/2022 9:20 AM --  Laboratory Lab Results: Recent Labs    12/16/22 0134 12/17/22 0124  WBC 13.0* 13.8*  HGB 9.2* 9.1*  HCT 30.2* 32.0*  PLT 495* 474*   BMET Recent Labs    12/16/22 0134 12/17/22 0124  NA 134* 138  K 3.2* 4.9  CL 98 103  CO2 24 27  GLUCOSE 96 85  BUN 9 11  CREATININE 0.57 0.61  CALCIUM 8.6* 9.2    COAG Lab Results  Component Value Date   INR 1.2 12/08/2022   INR 0.98 04/24/2017   No results found for: "PTT"     Plan for return to the PV Lab for right LE study and possible  intervention of the right LE with Dr. Scot Dock.   NPO past MN  Roxy Horseman PA-C  I have independently interviewed and examined patient and agree with PA assessment and plan above.  Today stating she just wants to have both of her legs cut off.  We discussed that this time she has no indication to remove the right leg and the left leg actually has much improved blood flow with very strong distal PT and DP signals traced far out on the foot possibly could heal a transmetatarsal amputation but certainly below-knee amputation has high likelihood and not be unreasonable to consider.  I did recommend proceeding with angiography to treat the right lower extremity tomorrow given that this would be her only method of pivoting and standing but at this time she is not interested in discussing.  I have recommended that we keep her on the schedule for angiography tomorrow realizing that she may cancel and she can have further discussion with Dr. Stanford Breed regarding amputations.  Ranger Petrich C. Donzetta Matters, MD [facility] [place] Office: [phone] Pager: [phone]

## 2022-12-18 ENCOUNTER — Encounter (HOSPITAL_COMMUNITY)
Admission: EM | Disposition: A | Discharge status: Home / Self Care | Payer: Self-pay | Source: Home / Self Care | Attending: Internal Medicine

## 2022-12-18 ENCOUNTER — Encounter (HOSPITAL_COMMUNITY): Payer: Self-pay | Admitting: Vascular Surgery

## 2022-12-18 DIAGNOSIS — I998 Other disorder of circulatory system: Secondary | ICD-10-CM | POA: Diagnosis not present

## 2022-12-18 DIAGNOSIS — E1169 Type 2 diabetes mellitus with other specified complication: Secondary | ICD-10-CM | POA: Diagnosis not present

## 2022-12-18 DIAGNOSIS — I70263 Atherosclerosis of native arteries of extremities with gangrene, bilateral legs: Secondary | ICD-10-CM | POA: Diagnosis not present

## 2022-12-18 DIAGNOSIS — T82858A Stenosis of vascular prosthetic devices, implants and grafts, initial encounter: Secondary | ICD-10-CM

## 2022-12-18 DIAGNOSIS — I10 Essential (primary) hypertension: Secondary | ICD-10-CM | POA: Diagnosis not present

## 2022-12-18 DIAGNOSIS — E876 Hypokalemia: Secondary | ICD-10-CM | POA: Diagnosis not present

## 2022-12-18 HISTORY — PX: PERIPHERAL VASCULAR INTERVENTION: CATH118257

## 2022-12-18 HISTORY — PX: ABDOMINAL AORTOGRAM W/LOWER EXTREMITY: CATH118223

## 2022-12-18 LAB — CREATININE, SERUM
Creatinine, Ser: 0.51 mg/dL (ref 0.44–1.00)
GFR, Estimated: >60 mL/min (ref >60)

## 2022-12-18 LAB — CBC
HCT: 32.8 % — ABNORMAL LOW (ref 36.0–46.0)
Hemoglobin: 9.9 g/dL — ABNORMAL LOW (ref 12.0–15.0)
MCH: 25.4 pg — ABNORMAL LOW (ref 26.0–34.0)
MCHC: 30.2 g/dL (ref 30.0–36.0)
MCV: 84.1 fL (ref 80.0–100.0)
Platelets: 426 10*3/uL — ABNORMAL HIGH (ref 150–400)
RBC: 3.9 MIL/uL (ref 3.87–5.11)
RDW: 23.1 % — ABNORMAL HIGH (ref 11.5–15.5)
WBC: 9.6 10*3/uL (ref 4.0–10.5)
nRBC: 0 % (ref 0.0–0.2)

## 2022-12-18 LAB — BASIC METABOLIC PANEL
Anion gap: 12 (ref 5–15)
BUN: 13 mg/dL (ref 8–23)
CO2: 21 mmol/L — ABNORMAL LOW (ref 22–32)
Calcium: 8.7 mg/dL — ABNORMAL LOW (ref 8.9–10.3)
Chloride: 97 mmol/L — ABNORMAL LOW (ref 98–111)
Creatinine, Ser: 0.55 mg/dL (ref 0.44–1.00)
GFR, Estimated: >60 mL/min (ref >60)
Glucose, Bld: 132 mg/dL — ABNORMAL HIGH (ref 70–99)
Potassium: 4.8 mmol/L (ref 3.5–5.1)
Sodium: 130 mmol/L — ABNORMAL LOW (ref 135–145)

## 2022-12-18 LAB — POCT ACTIVATED CLOTTING TIME
Activated Clotting Time: 255 seconds
Activated Clotting Time: 212 seconds
Activated Clotting Time: 185 seconds
Activated Clotting Time: 174 seconds

## 2022-12-18 LAB — GLUCOSE, CAPILLARY
Glucose-Capillary: 123 mg/dL — ABNORMAL HIGH (ref 70–99)
Glucose-Capillary: 93 mg/dL (ref 70–99)
Glucose-Capillary: 91 mg/dL (ref 70–99)
Glucose-Capillary: 96 mg/dL (ref 70–99)

## 2022-12-18 LAB — CULTURE, BLOOD (ROUTINE X 2): Culture: NO GROWTH

## 2022-12-18 SURGERY — ABDOMINAL AORTOGRAM W/LOWER EXTREMITY
Anesthesia: LOCAL | Laterality: Right

## 2022-12-18 MED ORDER — ACETAMINOPHEN 325 MG PO TABS: 650.0000 mg | ORAL_TABLET | ORAL | Status: DC | PRN

## 2022-12-18 MED ORDER — ONDANSETRON HCL 4 MG/2ML IJ SOLN: 4.0000 mg | Freq: Four times a day (QID) | INTRAMUSCULAR | Status: DC | PRN

## 2022-12-18 MED ORDER — HEPARIN SODIUM (PORCINE) 1000 UNIT/ML IJ SOLN
INTRAMUSCULAR | Status: AC
  Filled 2022-12-18: qty 10

## 2022-12-18 MED ORDER — IODIXANOL 320 MG/ML IV SOLN
INTRAVENOUS | Status: DC | PRN
  Administered 2022-12-18: 50 mL

## 2022-12-18 MED ORDER — SODIUM CHLORIDE 0.9 % IV SOLN: INTRAVENOUS | Status: DC

## 2022-12-18 MED ORDER — SODIUM CHLORIDE 0.9 % IV SOLN: 250.0000 mL | INTRAVENOUS | Status: DC | PRN

## 2022-12-18 MED ORDER — LABETALOL HCL 5 MG/ML IV SOLN
10.0000 mg | INTRAVENOUS | Status: DC | PRN
  Administered 2022-12-19: 10 mg via INTRAVENOUS
  Filled 2022-12-18: qty 4

## 2022-12-18 MED ORDER — HEPARIN SODIUM (PORCINE) 5000 UNIT/ML IJ SOLN
5000.0000 [IU] | Freq: Three times a day (TID) | INTRAMUSCULAR | Status: DC
  Administered 2022-12-19: 5000 [IU] via SUBCUTANEOUS
  Filled 2022-12-18: qty 1

## 2022-12-18 MED ORDER — LIDOCAINE HCL (PF) 1 % IJ SOLN
INTRAMUSCULAR | Status: DC | PRN
  Administered 2022-12-18: 10 mL via INTRADERMAL

## 2022-12-18 MED ORDER — HEPARIN (PORCINE) IN NACL 1000-0.9 UT/500ML-% IV SOLN
INTRAVENOUS | Status: DC | PRN
  Administered 2022-12-18 (×2): 500 mL

## 2022-12-18 MED ORDER — SODIUM CHLORIDE 0.9% FLUSH: 3.0000 mL | INTRAVENOUS | Status: DC | PRN

## 2022-12-18 MED ORDER — LIDOCAINE HCL (PF) 1 % IJ SOLN
INTRAMUSCULAR | Status: AC
  Filled 2022-12-18: qty 30

## 2022-12-18 MED ORDER — HYDRALAZINE HCL 20 MG/ML IJ SOLN: 5.0000 mg | INTRAMUSCULAR | Status: DC | PRN

## 2022-12-18 MED ORDER — SODIUM CHLORIDE 0.9 % WEIGHT BASED INFUSION
1.0000 mL/kg/h | INTRAVENOUS | Status: AC
  Administered 2022-12-18: 1 mL/kg/h via INTRAVENOUS

## 2022-12-18 MED ORDER — HEPARIN SODIUM (PORCINE) 1000 UNIT/ML IJ SOLN
INTRAMUSCULAR | Status: DC | PRN
  Administered 2022-12-18: 6000 [IU] via INTRAVENOUS

## 2022-12-18 MED ORDER — SODIUM CHLORIDE 0.9% FLUSH
3.0000 mL | Freq: Two times a day (BID) | INTRAVENOUS | Status: DC
  Administered 2022-12-18 – 2022-12-19 (×2): 3 mL via INTRAVENOUS

## 2022-12-18 MED FILL — Midazolam HCl Inj 5 MG/5ML (Base Equivalent): INTRAMUSCULAR | Qty: 1 | Status: AC

## 2022-12-18 SURGICAL SUPPLY — 21 items
BALLN IN.PACT DCB 4X120 (BALLOONS) ×2
BALLN MUSTANG 4X60X135 (BALLOONS) ×2
BALLOON MUSTANG 4X60X135 (BALLOONS) IMPLANT
CATH OMNI FLUSH 5F 65CM (CATHETERS) IMPLANT
CLOSURE PERCLOSE PROSTYLE (VASCULAR PRODUCTS) IMPLANT
DCB IN.PACT 4X120 (BALLOONS) IMPLANT
GLIDEWIRE ADV .035X260CM (WIRE) IMPLANT
KIT ENCORE 26 ADVANTAGE (KITS) IMPLANT
KIT MICROPUNCTURE NIT STIFF (SHEATH) IMPLANT
KIT PV (KITS) ×2 IMPLANT
SHEATH CATAPULT 6FR 45 (SHEATH) IMPLANT
SHEATH PINNACLE 5F 10CM (SHEATH) IMPLANT
SHEATH PINNACLE 6F 10CM (SHEATH) IMPLANT
SHEATH PINNACLE 7F 10CM (SHEATH) IMPLANT
SHEATH PROBE COVER 6X72 (BAG) IMPLANT
STENT INNOVA 5X40X130 (Permanent Stent) IMPLANT
STENT ZILVER PTX 5X60 (Permanent Stent) IMPLANT
STOPCOCK MORSE 400PSI 3WAY (MISCELLANEOUS) IMPLANT
SYR MEDRAD MARK 7 150ML (SYRINGE) ×2 IMPLANT
TRANSDUCER W/STOPCOCK (MISCELLANEOUS) ×2 IMPLANT
TRAY PV CATH (CUSTOM PROCEDURE TRAY) ×2 IMPLANT

## 2022-12-18 NOTE — Progress Notes (Signed)
PT Cancellation Note  Patient Details Name: Tammy Mcpherson MRN: [id number] DOB: Jan 10, 1956   Cancelled Treatment:    Reason Eval/Treat Not Completed: Patient at procedure or test/unavailable   [name] [name] 12/18/2022, 9:12 AM [place] Office: [phone]

## 2022-12-18 NOTE — Progress Notes (Signed)
        She will not respond verbally this am She has asked if she goes through this procedure for the right leg then go home and f/u on possible amputation needs in the future    Pending angiogram Roxy Horseman PA-C

## 2022-12-18 NOTE — Progress Notes (Signed)
PT Cancellation Note  Patient Details Name: Tammy Mcpherson MRN: [id number] DOB: 1955/11/28   Cancelled Treatment:    Reason Eval/Treat Not Completed: Other (comment) (pt sleeping and not easily woken to sound or touch by spoue or therapist)   Sandy Salaam Xaviera Flaten 12/18/2022, 7:38 AM [place] Office: [phone]

## 2022-12-18 NOTE — Op Note (Addendum)
PATIENT: Tammy Mcpherson      MRN: [id number] DOB: 26-Mar-1956    DATE OF PROCEDURE: 12/18/2022  INDICATIONS:    Tammy Mcpherson is a 67 y.o. female who had presented with critical limb ischemia bilaterally.  She underwent left lower extremity intervention last week and presents now for intervention on the right for disease in the superficial femoral artery  PROCEDURE:    Ultrasound-guided access to the left common femoral artery Selective catheterization of the right superficial femoral artery (third order catheterization) Right lower extremity arteriogram Angioplasty and stenting of the right superficial femoral artery Drug-coated balloon angioplasty of in stent stenosis of previous right femoral stent.  SURGEON: Judeth Cornfield. Scot Dock, MD, FACS  ANESTHESIA: Local  EBL: Minimal  TECHNIQUE: The patient was brought to the peripheral vascular lab and was not sedated as she was already somewhat sedated.  The patient's heart rate, blood pressure, and oxygen saturation were monitored by the nurse continuously during the procedure.  Both groins were prepped and draped in the usual sterile fashion.  Under ultrasound guidance, after the skin was anesthetized, I cannulated the left common femoral artery with a micropuncture needle and a micropuncture sheath was introduced over a wire.  This was exchanged for a 5 [place] sheath over a Bentson wire.  By ultrasound the femoral artery was patent. A real-time image was obtained and sent to the server.  I advanced the Glidewire advantage into the infrarenal aorta and the Omni catheter was placed at the bifurcation and then positioned into the right common iliac artery.  The wire was advanced down into the external iliac artery.  I then exchanged the 5 French sheath for a 6 French 45 cm catapulted sheath.  This was initially positioned in the common femoral artery.  Right lower extremity arteriograms obtained demonstrating the areas of concern in the  superficial femoral artery.  I then advanced the wire into the superficial femoral artery and advanced the sheath using the dilator into the superficial femoral artery.  The patient was heparinized.  ACT was monitored throughout the procedure.  There were 2 tandem stenoses in the superficial femoral artery above the previous stent.  The proximal stenosis was about 50% and the more distal stenosis was a focal 90% stenosis.  I elected to address this with drug-coated balloon angioplasty.  A 5 mm x 60 mm Zilver stent was selected and positioned across the 2 stenoses and deployed without difficulty.  Postdilatation was done with a 4 x 60 balloon.  There was an excellent result.  I then addressed the in stent stenoses within the previous placed stent with a Impact drug-coated balloon.  This was a 4 mm x 120 mm balloon.  This was inflated to nominal pressure for 3 minutes.  Completion film showed excellent result.  However I was not happy with the area between the 2 stents and elected to address this with a second stent.  And an oval 5 x 40 stent was selected and deployed overlapping both previous stents without difficulty.  Postdilatation was done with the 4 mm x 60 mm balloon which was inflated to nominal pressure for 1 minute.  Completion film showed an excellent result.  We then did runoff which showed no disease distally.  At the completion of the procedure the sheath was retracted into the external iliac artery on the left and removed over a wire.  A Perclose device was placed twice but at both times the suture broke and was not successful  and we therefore replaced this with a 7 [place] sheath.  This will be removed manually.  Patient tolerated the procedure well was transferred to the holding area in stable condition.  FINDINGS:   There is mild disease in the right common femoral artery.  The deep femoral artery is patent.  The superficial femoral artery is patent proximally but then had 2 tandem stenoses in  the proximal to mid thigh.  The proximal stenosis was about 50% and there was a focal 90% stenosis distal to that.  These were addressed with drug-coated balloon angioplasty as described above.  ` The in-stent stenoses were addressed with a 4 x 120 Impact balloon as described above.  There was no residual stenosis.  The area between the 2 is stents was addressed with no residual stenosis. Distally, the popliteal artery, anterior tibial, posterior tibial, peroneal and tibial peroneal trunk arteries were patent.  GLASS FP 3 IP 0 Glass Stage II  [name], MD, FACS [facility]  DATE OF DICTATION:   12/18/2022

## 2022-12-18 NOTE — Interval H&P Note (Signed)
History and Physical Interval Note:  12/18/2022 7:07 AM  Tammy Mcpherson  has presented today for surgery, with the diagnosis of atherosclerosis of native arteries of right lower extremity with gangreene.  The various methods of treatment have been discussed with the patient and family. After consideration of risks, benefits and other options for treatment, the patient has consented to  Procedure(s): ABDOMINAL AORTOGRAM W/LOWER EXTREMITY (N/A) as a surgical intervention.  The patient's history has been reviewed, patient examined, no change in status, stable for surgery.  I have reviewed the patient's chart and labs.  Questions were answered to the patient's satisfaction.     [name]

## 2022-12-18 NOTE — Care Management Important Message (Signed)
Important Message  Patient Details  Name: Tammy Mcpherson MRN: [id number] Date of Birth: 07/19/1956   Medicare Important Message Given:  Yes     Shelda Altes 12/18/2022, 8:17 AM

## 2022-12-18 NOTE — Progress Notes (Signed)
In and out cath done with Mia N.T. peri care done per cath and post cath. 1150 ml ambur urine obtain. Clamped cath x 2 due to large amt. Patient tol well.

## 2022-12-18 NOTE — Progress Notes (Signed)
PROGRESS NOTE        PATIENT DETAILS Name: Tammy Mcpherson Age: 67 y.o. Sex: female Date of Birth: May 15, 1956 Admit Date: 12/13/2022 Admitting Physician Evalee Mutton Kristeen Mans, MD [id number], [place], [place]  Brief Summary: Patient is a 67 y.o.  female with known history of PAD-with chronic bilateral lower extremity ischemia, recent hospitalization for GI bleeding from AVMs-presenting with worsening gangrenous changes/rest pain involving left lower extremity.  Significant events: 3/13>> admit to [facility]  Significant studies: 3/13>> x-ray left foot: No osteomyelitis.  Significant microbiology data: 3/13>> blood culture: No growth  Procedures: 3/15>> left lower extremity angiogram with angioplasty stenting of left femoral-popliteal artery  Consults: Vascular surgery  Subjective: Sleeping comfortably-nods head yes/no-acknowledges my presence-refuses to open her eyes.  Moves all 4 extremities when asked.  Mumbles something incoherently.  Spouse at side-asking if she could go home after angiography and come back for amputation.  Acknowledges continued rest pain in her left lower extremity.   Objective: Vitals: Blood pressure (!) 134/49, pulse 89, temperature (!) 97.4 F (36.3 C), temperature source Oral, resp. rate 16, height 5\' 3"  (1.6 m), weight 56.5 kg, SpO2 97 %.   Exam: Gen Exam: not in any distress HEENT:atraumatic, normocephalic Chest: B/L clear to auscultation anteriorly CVS:S1S2 regular Abdomen:soft non tender, non distended Extremities:no edema Neurology: Moving all 4 extremities when asked. Skin: no rash  Pertinent Labs/Radiology:    Latest Ref Rng & Units 12/17/2022    1:24 AM 12/16/2022    1:34 AM 12/15/2022    8:17 AM  CBC  WBC 4.0 - 10.5 K/uL 13.8  13.0  15.0   Hemoglobin 12.0 - 15.0 g/dL 9.1  9.2  10.3   Hematocrit 36.0 - 46.0 % 32.0  30.2  33.6   Platelets 150 - 400 K/uL 474  495  607     Lab Results  Component Value Date   NA 130 (L)  12/18/2022   K 4.8 12/18/2022   CL 97 (L) 12/18/2022   CO2 21 (L) 12/18/2022      Assessment/Plan: Critical left lower extremity ischemia with worsening rest pain Gangrenous changes to numerous toes of LLE Known bilateral lower extremity PAD s/p multiple toe amputations in both foot/and angioplasty in the past S/p LLE angiogram 3/15 with stenting of femoral popliteal artery Vascular surgery planning RLE angiogram 3/18 Continue DAPT/statin Will need to discuss timing of possible amputation with vascular surgery given she continues to have rest pain in her LLE in spite of revascularization. Patient has been frustrated with this hospitalization due to a variety of issues-spouse today indicated to me that they would prefer to go home so that she can get some rest-before proceeding with amputation. Continue to mobilize with PT/OT Continue narcotics as needed.   Recent GI bleeding due to gastrointestinal AVMs Per patient-brown stools Will continue PPI Follow Hb trend   Chronic combined HFpEF/HFrEF Euvolemic Continue Entresto   Hypokalemia Replete/recheck     Hyponatremia Mild Follow for now  Acute urinary retention Occurred last night Required in/out catheterization Discussed with RN-continue to monitor closely-if it reoccurs-May need Foley catheter insertion.   HTN BP now stable Continue Coreg/Entresto   DM-2 Hold metformin Continue SSI  Recent Labs    12/17/22 1711 12/17/22 2125 12/18/22 0604  GLUCAP 142* 98 123*       COPD Continue bronchodilators   IBS-diarrhea predominant Supportive care  GERD PPI  BMI Estimated body mass index is 22.06 kg/m as calculated from the following:   Height as of this encounter: 5\' 3"  (1.6 m).   Weight as of this encounter: 56.5 kg.   Code status:   Code Status: Full Code   DVT Prophylaxis: enoxaparin (LOVENOX) injection 40 mg Start: 12/14/22 1000   Family Communication: None at bedside.   Disposition  Plan: Status is: Inpatient Remains inpatient appropriate because: Severity of illness   Planned Discharge Destination:Home health   Diet: Diet Order             Diet NPO time specified Except for: Sips with Meds  Diet effective midnight                     Antimicrobial agents: Anti-infectives (From admission, onward)    Start     Dose/Rate Route Frequency Ordered Stop   12/14/22 0400  clindamycin (CLEOCIN) IVPB 600 mg        600 mg 100 mL/hr over 30 Minutes Intravenous  Once 12/14/22 0337 12/14/22 0443        MEDICATIONS: Scheduled Meds:  aspirin  81 mg Oral q morning   atorvastatin  80 mg Oral QHS   carvedilol  6.25 mg Oral BID WC   cholecalciferol  2,000 Units Oral Daily   clopidogrel  75 mg Oral QHS   enoxaparin (LOVENOX) injection  40 mg Subcutaneous Daily   FLUoxetine  40 mg Oral Daily   gabapentin  300 mg Oral TID   insulin aspart  0-9 Units Subcutaneous TID WC   pantoprazole  40 mg Oral QHS   polyethylene glycol  17 g Oral Daily   sacubitril-valsartan  1 tablet Oral BID   sodium chloride flush  3 mL Intravenous Q12H   sodium chloride flush  3 mL Intravenous Q12H   Continuous Infusions:  sodium chloride     sodium chloride     sodium chloride 100 mL/hr at 12/18/22 0700   PRN Meds:.sodium chloride, sodium chloride, acetaminophen, albuterol, ALPRAZolam, cyclobenzaprine, fentaNYL (SUBLIMAZE) injection, hydrALAZINE, hydrALAZINE, labetalol, ondansetron (ZOFRAN) IV, ondansetron **OR** [DISCONTINUED] ondansetron (ZOFRAN) IV, oxyCODONE, sodium chloride flush, sodium chloride flush   I have personally reviewed following labs and imaging studies  LABORATORY DATA: CBC: Recent Labs  Lab 12/13/22 1755 12/14/22 0816 12/15/22 0817 12/16/22 0134 12/17/22 0124  WBC 14.4* 14.5* 15.0* 13.0* 13.8*  NEUTROABS 11.2*  --   --   --   --   HGB 10.6* 10.1* 10.3* 9.2* 9.1*  HCT 36.7 34.2* 33.6* 30.2* 32.0*  MCV 83.0 84.2 82.6 82.3 85.6  PLT 803* 653* 607* 495*  474*     Basic Metabolic Panel: Recent Labs  Lab 12/13/22 1755 12/14/22 0816 12/15/22 0817 12/16/22 0134 12/17/22 0124 12/18/22 0120  NA 137  --  135 134* 138 130*  K 3.1*  --  3.4* 3.2* 4.9 4.8  CL 101  --  97* 98 103 97*  CO2 24  --  26 24 27  21*  GLUCOSE 153*  --  98 96 85 132*  BUN 11  --  10 9 11 13   CREATININE 1.11* 0.74 0.60 0.57 0.61 0.55  CALCIUM 9.6  --  9.0 8.6* 9.2 8.7*     GFR: Estimated Creatinine Clearance: 58 mL/min (by C-G formula based on SCr of 0.55 mg/dL).  Liver Function Tests: Recent Labs  Lab 12/13/22 1755  AST 12*  ALT 10  ALKPHOS 125  BILITOT 0.2*  PROT 7.4  ALBUMIN 2.8*    No results for input(s): "LIPASE", "AMYLASE" in the last 168 hours. No results for input(s): "AMMONIA" in the last 168 hours.  Coagulation Profile: No results for input(s): "INR", "PROTIME" in the last 168 hours.   Cardiac Enzymes: No results for input(s): "CKTOTAL", "CKMB", "CKMBINDEX", "TROPONINI" in the last 168 hours.  BNP (last 3 results) No results for input(s): "PROBNP" in the last 8760 hours.  Lipid Profile: Recent Labs    12/16/22 0134  CHOL 79  HDL 24*  LDLCALC 29  TRIG 129  CHOLHDL 3.3     Thyroid Function Tests: No results for input(s): "TSH", "T4TOTAL", "FREET4", "T3FREE", "THYROIDAB" in the last 72 hours.  Anemia Panel: No results for input(s): "VITAMINB12", "FOLATE", "FERRITIN", "TIBC", "IRON", "RETICCTPCT" in the last 72 hours.  Urine analysis:    Component Value Date/Time   COLORURINE STRAW (A) 10/26/2022 1540   APPEARANCEUR CLEAR 10/26/2022 1540   LABSPEC 1.004 (L) 10/26/2022 1540   PHURINE 6.0 10/26/2022 1540   GLUCOSEU NEGATIVE 10/26/2022 1540   GLUCOSEU NEGATIVE 04/26/2022 1508   HGBUR SMALL (A) 10/26/2022 1540   BILIRUBINUR NEGATIVE 10/26/2022 1540   BILIRUBINUR negative 06/17/2020 1534   KETONESUR NEGATIVE 10/26/2022 1540   PROTEINUR NEGATIVE 10/26/2022 1540   UROBILINOGEN 0.2 04/26/2022 1508   NITRITE NEGATIVE  10/26/2022 1540   LEUKOCYTESUR TRACE (A) 10/26/2022 1540    Sepsis Labs: Lactic Acid, Venous    Component Value Date/Time   LATICACIDVEN 1.6 12/13/2022 1758    MICROBIOLOGY: Recent Results (from the past 240 hour(s))  Blood culture (routine x 2)     Status: None   Collection Time: 12/13/22  5:58 PM   Specimen: BLOOD LEFT FOREARM  Result Value Ref Range Status   Specimen Description BLOOD LEFT FOREARM  Final   Special Requests   Final    BOTTLES DRAWN AEROBIC AND ANAEROBIC Blood Culture results may not be optimal due to an excessive volume of blood received in culture bottles   Culture   Final    NO GROWTH 5 DAYS Performed at [facility], [place] [address], [place], [place] [address]    Report Status 12/18/2022 FINAL  Final    RADIOLOGY STUDIES/RESULTS: No results found.   LOS: 4 days   Oren Binet, MD  [facility]    To contact the attending provider between 7A-7P or the covering provider during after hours 7P-7A, please log into the web site [email or web address] and access using universal [place] password for that web site. If you do not have the password, please call the hospital operator.  12/18/2022, 8:42 AM

## 2022-12-18 NOTE — Progress Notes (Signed)
PHARMACIST LIPID MONITORING   Tammy Mcpherson is a 67 y.o. female admitted on 12/13/2022 with critical limb ischemia.  Pharmacy has been consulted to optimize lipid-lowering therapy with the indication of secondary prevention for clinical ASCVD.  Recent Labs:  Lipid Panel (last 6 months):   Lab Results  Component Value Date   CHOL 79 12/16/2022   TRIG 129 12/16/2022   HDL 24 (L) 12/16/2022   CHOLHDL 3.3 12/16/2022   VLDL 26 12/16/2022   LDLCALC 29 12/16/2022   LDLDIRECT 64.0 10/25/2022    Hepatic function panel (last 6 months):   Lab Results  Component Value Date   AST 12 (L) 12/13/2022   ALT 10 12/13/2022   ALKPHOS 125 12/13/2022   BILITOT 0.2 (L) 12/13/2022    SCr (since admission):   Serum creatinine: 0.55 mg/dL 12/18/22 0120 Estimated creatinine clearance: 58 mL/min  Current therapy and lipid therapy tolerance Current lipid-lowering therapy: atorvastatin 80mg  daily   Assessment:   No need to escalate therapy - LDL at goal: 29 on 12/16/22   Plan:    1.Statin intensity (high intensity recommended for all patients regardless of the LDL):  No statin changes. The patient is already on a high intensity statin.  2.Add ezetimibe (if any one of the following):   Not indicated at this time.  3.Refer to lipid clinic:   No  4.Follow-up with:  Primary care provider - Tammy Mcpherson, [place]  5.Follow-up labs after discharge:  No changes in lipid therapy, repeat a lipid panel in one year.       Wilson Singer, PharmD Clinical Pharmacist 12/18/2022 1:59 PM

## 2022-12-18 NOTE — Progress Notes (Signed)
Patient was incont. of urine at 12 midnight. . Then incont . Small amt now. Abdo.  Is now distended and firm. Bladder scan > 999 enc. Patient to urinate and she vded 200 ml re bladder scan patient still > 999 and still distended and firm. Tim R.N. aware. Dr. Sidney Ace text page the above. Awaiting return call.

## 2022-12-18 NOTE — Progress Notes (Signed)
Sheath pull: ACT at 12:45 174. Sheath pulled at 12:50 manual pressure held for approximately 25 minutes. After pressure held Level 0, no pain upon palpation, no swelling, no hematoma, no ecchymosis. Site dressed with gauze and transparent cover.

## 2022-12-19 ENCOUNTER — Encounter (HOSPITAL_COMMUNITY): Payer: Self-pay | Admitting: Vascular Surgery

## 2022-12-19 ENCOUNTER — Ambulatory Visit: Payer: PPO | Admitting: Cardiology

## 2022-12-19 DIAGNOSIS — I10 Essential (primary) hypertension: Secondary | ICD-10-CM | POA: Diagnosis not present

## 2022-12-19 DIAGNOSIS — I96 Gangrene, not elsewhere classified: Secondary | ICD-10-CM | POA: Diagnosis not present

## 2022-12-19 DIAGNOSIS — I998 Other disorder of circulatory system: Secondary | ICD-10-CM | POA: Diagnosis not present

## 2022-12-19 DIAGNOSIS — E1169 Type 2 diabetes mellitus with other specified complication: Secondary | ICD-10-CM | POA: Diagnosis not present

## 2022-12-19 LAB — LIPID PANEL
Cholesterol: 89 mg/dL (ref 0–200)
HDL: 28 mg/dL — ABNORMAL LOW (ref >40)
LDL Cholesterol: 38 mg/dL (ref 0–99)
Total CHOL/HDL Ratio: 3.2 RATIO
Triglycerides: 115 mg/dL (ref <150)
VLDL: 23 mg/dL (ref 0–40)

## 2022-12-19 LAB — GLUCOSE, CAPILLARY: Glucose-Capillary: 85 mg/dL (ref 70–99)

## 2022-12-19 LAB — BASIC METABOLIC PANEL
Anion gap: 10 (ref 5–15)
BUN: 8 mg/dL (ref 8–23)
CO2: 24 mmol/L (ref 22–32)
Calcium: 8.7 mg/dL — ABNORMAL LOW (ref 8.9–10.3)
Chloride: 97 mmol/L — ABNORMAL LOW (ref 98–111)
Creatinine, Ser: 0.55 mg/dL (ref 0.44–1.00)
GFR, Estimated: >60 mL/min (ref >60)
Glucose, Bld: 78 mg/dL (ref 70–99)
Potassium: 4 mmol/L (ref 3.5–5.1)
Sodium: 131 mmol/L — ABNORMAL LOW (ref 135–145)

## 2022-12-19 MED ORDER — OXYCODONE HCL 5 MG PO TABS: 5.0000 mg | ORAL_TABLET | Freq: Four times a day (QID) | ORAL | 0 refills | Status: DC | PRN

## 2022-12-19 MED ORDER — METFORMIN HCL ER 500 MG PO TB24: 500.0000 mg | ORAL_TABLET | Freq: Every day | ORAL | 1 refills | Status: DC

## 2022-12-19 NOTE — TOC Transition Note (Signed)
Transition of Care (TOC) - CM/SW Discharge Note Marvetta Gibbons RN, BSN Transitions of Care Unit 4E- RN Case Manager See Treatment Team for direct phone #   Patient Details  Name: Tammy Mcpherson MRN: [id number] Date of Birth: 26-Mar-1956  Transition of Care [facility]) CM/SW Contact:  Dawayne Patricia, RN Phone Number: 12/19/2022, 11:39 AM   Clinical Narrative:    Pt stable for transition home today, verbal order for outpt PT referral, CM spoke with pt at spouse at bedside- pt agreeable to outpt PT referral- choice offered for closest location. Per pt and spouse - they are agreeable to [place] location - referral has been sent to [facility] outpt rehab in [place] for outpt PT via epic. - info placed on AVS.   Pt also requested RW For home- on preference for provider and agreeable to in house provider to deliver to room prior to discharge.   Call made to [employer] for DME referral- RW to deliver to room prior to discharge  No further TOC needs noted, Spouse to transport home   Final next level of care: OP Rehab Barriers to Discharge: No Barriers Identified   Patient Goals and CMS Choice CMS Medicare.gov Compare Post Acute Care list provided to:: Patient Choice offered to / list presented to : Patient, Spouse  Discharge Placement               Home          Discharge Plan and Services Additional resources added to the After Visit Summary for     Discharge Planning Services: CM Consult Post Acute Care Choice: Durable Medical Equipment          DME Arranged: Gilford Rile rolling DME Agency: [employer] Date DME Agency Contacted: 12/19/22 Time DME Agency Contacted: 1000 Representative spoke with at DME Agency: Erasmo Downer HH Arranged: NA [place] Agency: NA        Social Determinants of Health ([place]) Interventions SDOH Screenings   Food Insecurity: No Food Insecurity (12/09/2022)  Housing: Low Risk  (12/08/2022)  Transportation Needs: No Transportation Needs (12/09/2022)  Utilities: Not  At Risk (12/09/2022)  Alcohol Screen: Low Risk  (10/27/2019)  Depression (PHQ2-9): Medium Risk (12/13/2022)  Tobacco Use: High Risk (12/19/2022)     Readmission Risk Interventions    12/19/2022   11:39 AM  Readmission Risk Prevention Plan  Transportation Screening Complete  PCP or Specialist Appt within 3-5 Days Complete  HRI or [place] Complete  Social Work Consult for [place] Planning/Counseling Complete  Palliative Care Screening Not Applicable  Medication Review Press photographer) Complete

## 2022-12-19 NOTE — Progress Notes (Signed)
Physical Therapy Treatment Patient Details Name: Tammy Mcpherson MRN: [id number] DOB: [birth year] Today's Date: 12/19/2022   History of Present Illness 67 y/o F presenting  admitted 3/13 from PCP for L foot gangrene, s/p LLE angiogram femoropopliteal angoiplasty and stenting 3/15. RLE revascularization 3/18. PMH includes HTN, DM2, COPD, recent hospitalization for GI bleed from AVMs, PAD with chronic LLE ischemia.    PT Comments    Patient progressing slowly towards PT goals. Session focused on progressive ambulation and transfers. Pt reports feeling well today just tired and fatigued. Requires Min-mod A to stand from all surfaces. Able to tolerate walking short distances but noted to have partial buckling with increased distance needing a seated rest break and more assist. Spouse present and reports having all necessary DME at home. Encouraged increasing activity as much as tolerated at home and walking to bathroom instead of using BSC to improve overall strength. HR up to 140 bpm with mobility, Sp02 >93% on 2L. Eager to return home. Will follow.    Recommendations for follow up therapy are one component of a multi-disciplinary discharge planning process, led by the attending physician.  Recommendations may be updated based on patient status, additional functional criteria and insurance authorization.  Follow Up Recommendations  Outpatient PT (per spouse request)     Assistance Recommended at Discharge Frequent or constant Supervision/Assistance  Patient can return home with the following A little help with bathing/dressing/bathroom;Assistance with cooking/housework;Assist for transportation;Help with stairs or ramp for entrance;A lot of help with walking and/or transfers   Equipment Recommendations  Rolling walker (2 wheels)    Recommendations for Other Services       Precautions / Restrictions Precautions Precautions: Fall Precaution Comments: watch vitals Restrictions Weight Bearing  Restrictions: No     Mobility  Bed Mobility               General bed mobility comments: Sitting on EOB upon PT arrival.    Transfers Overall transfer level: Needs assistance Equipment used: Rolling walker (2 wheels) Transfers: Sit to/from Stand, Bed to chair/wheelchair/BSC Sit to Stand: Min assist, Mod assist, +2 physical assistance   Step pivot transfers: Min assist, Mod assist, +2 physical assistance       General transfer comment: Stood from EOB x1 with mod A initially, performed SPT bed to [facility] and stood from [facility] x2 with cues for hand placement/technique. Bil knee flexion and partial buckling when fatigued.    Ambulation/Gait Ambulation/Gait assistance: Min assist, +2 safety/equipment, +2 physical assistance Gait Distance (Feet): 15 Feet (+16') Assistive device: Rolling walker (2 wheels) Gait Pattern/deviations: Step-to pattern, Decreased stride length, Trunk flexed, Shuffle Gait velocity: decreased Gait velocity interpretation: <1.31 ft/sec, indicative of household ambulator   General Gait Details: Slow, unsteady gait with decreased foot clearance bilaterally and increased WB through BUEs, increased knee flexion and partial buckling towards enf of gait due to fatigue/weakness. 1 seated rest break due to needing to urinate. HR up to 140 bpm. Sp02 remained >92% on 2L   Stairs             Wheelchair Mobility    Modified Rankin (Stroke Patients Only)       Balance Overall balance assessment: Needs assistance Sitting-balance support: Feet supported, No upper extremity supported Sitting balance-Leahy Scale: Fair     Standing balance support: During functional activity, Reliant on assistive device for balance, Bilateral upper extremity supported Standing balance-Leahy Scale: Poor  Cognition Arousal/Alertness: Awake/alert Behavior During Therapy: Flat affect Overall Cognitive Status: Impaired/Different from  baseline Area of Impairment: Attention, Safety/judgement, Problem solving                   Current Attention Level: Sustained     Safety/Judgement: Decreased awareness of safety, Decreased awareness of deficits   Problem Solving: Slow processing, Decreased initiation, Difficulty sequencing, Requires verbal cues          Exercises      General Comments General comments (skin integrity, edema, etc.): HR up to 140 bpm with mobility, Sp02 >93% on 2L, spouse present.      Pertinent Vitals/Pain Pain Assessment Pain Assessment: No/denies pain    Home Living                          Prior Function            PT Goals (current goals can now be found in the care plan section) Progress towards PT goals: Progressing toward goals    Frequency    Min 3X/week      PT Plan Current plan remains appropriate    Co-evaluation              AM-PAC PT "6 Clicks" Mobility   Outcome Measure  Help needed turning from your back to your side while in a flat bed without using bedrails?: A Little Help needed moving from lying on your back to sitting on the side of a flat bed without using bedrails?: A Little Help needed moving to and from a bed to a chair (including a wheelchair)?: A Lot Help needed standing up from a chair using your arms (e.g., wheelchair or bedside chair)?: A Lot Help needed to walk in hospital room?: Total Help needed climbing 3-5 steps with a railing? : Total 6 Click Score: 12    End of Session Equipment Utilized During Treatment: Gait belt;Oxygen Activity Tolerance: Patient limited by fatigue Patient left: in bed;with call bell/phone within reach;with bed alarm set;with family/visitor present Nurse Communication: Mobility status PT Visit Diagnosis: Other abnormalities of gait and mobility (R26.89);Muscle weakness (generalized) (M62.81)     Time: [id number] PT Time Calculation (min) (ACUTE ONLY): 23 min  Charges:  $Gait Training:  8-22 mins                     Marisa Severin, PT, DPT Acute Rehabilitation Services Secure chat preferred Office [place] 12/19/2022, 10:11 AM

## 2022-12-19 NOTE — Progress Notes (Signed)
[facility] [place] PROGRESS NOTE  ASSESSMENT / PLAN: Tammy Mcpherson is a 67 y.o. female status post bilateral endovascular interventions for bilateral chronic limb threatening ischemia with gangrene.  Recommend: Complete cessation from all tobacco products. Blood glucose control with goal A1c < 7%. Blood pressure control with goal blood pressure < 140/90 mmHg. Lipid reduction therapy with goal LDL-C <100 mg/dL (<70 if symptomatic from PAD).  Aspirin 81mg  PO QD.  Clopidogrel 75mg  PO QD. Atorvastatin 40-80mg  PO QD (or other "high intensity" statin therapy).  She is optimized from a vascular standpoint. She would like to go home and discuss TMA with me in a week. I think this is safe to do.  SUBJECTIVE: No complaints. Reviewed angiogram results with patient this morning.  OBJECTIVE: BP (!) 133/48 (BP Location: Right Arm)   Pulse 86   Temp 98 F (36.7 C) (Oral)   Resp 18   Ht 5\' 3"  (1.6 m)   Wt 56.5 kg   SpO2 98%   BMI 22.06 kg/m   Intake/Output Summary (Last 24 hours) at 12/19/2022 0730 Last data filed at 12/19/2022 0415 Gross per 24 hour  Intake 392.33 ml  Output 550 ml  Net -157.67 ml    Chronically ill woman in no distress Regular rate and rhythm Unlabored breathing Brisk doppler flow in bilateral feet     Latest Ref Rng & Units 12/18/2022    2:16 PM 12/17/2022    1:24 AM 12/16/2022    1:34 AM  CBC  WBC 4.0 - 10.5 K/uL 9.6  13.8  13.0   Hemoglobin 12.0 - 15.0 g/dL 9.9  9.1  9.2   Hematocrit 36.0 - 46.0 % 32.8  32.0  30.2   Platelets 150 - 400 K/uL 426  474  495         Latest Ref Rng & Units 12/19/2022    1:03 AM 12/18/2022    2:16 PM 12/18/2022    1:20 AM  CMP  Glucose 70 - 99 mg/dL 78   132   BUN 8 - 23 mg/dL 8   13   Creatinine 0.44 - 1.00 mg/dL 0.55  0.51  0.55   Sodium 135 - 145 mmol/L 131   130   Potassium 3.5 - 5.1 mmol/L 4.0   4.8   Chloride 98 - 111 mmol/L 97   97   CO2 22 - 32 mmol/L 24   21   Calcium 8.9 - 10.3 mg/dL 8.7    8.7     Estimated Creatinine Clearance: 58 mL/min (by C-G formula based on SCr of 0.55 mg/dL).  Yevonne Aline. Stanford Breed, MD [facility] Phone Number: [phone] 12/19/2022 7:30 AM

## 2022-12-19 NOTE — Discharge Summary (Signed)
PATIENT DETAILS Name: Tammy Mcpherson Age: 67 y.o. Sex: female Date of Birth: 10/01/1956 MRN: [id number]. Admitting Physician: Jonetta Osgood, MD [id number], [place], [place]  Admit Date: 12/13/2022 Discharge date: 12/19/2022  Recommendations for Outpatient Follow-up:  Follow up with PCP in 1-2 weeks Please obtain CMP/CBC in one week Please ensure follow up with vascular surgery  Admitted From:  Home  Disposition: Outpatient PT   Discharge Condition: fair  CODE STATUS:   Code Status: Full Code   Diet recommendation:  Diet Order             Diet - low sodium heart healthy           Diet Carb Modified           Diet Carb Modified Fluid consistency: Thin; Room service appropriate? Yes with Assist  Diet effective now                    Brief Summary: Patient is a 67 y.o.  female with known history of PAD-with chronic bilateral lower extremity ischemia, recent hospitalization for GI bleeding from AVMs-presenting with worsening gangrenous changes/rest pain involving left lower extremity.   Significant events: 3/13>> admit to [facility]   Significant studies: 3/13>> x-ray left foot: No osteomyelitis.   Significant microbiology data: 3/13>> blood culture: No growth   Procedures: 3/15>> left lower extremity angiogram with angioplasty stenting of left femoral-popliteal artery 3/18>> right lower extremity angiogram with angioplasty/stenting of right superficial femoral artery, drug coated balloon angioplasty of in-stent stenosis of previous right femoral stent.   Consults: Vascular surgery  Brief Hospital Course: Critical left lower extremity ischemia with worsening rest pain Gangrenous changes to numerous toes of LLE Known bilateral lower extremity PAD s/p multiple toe amputations in both foot/and angioplasty in the past S/p LLE angiogram 3/15 with stenting of femoral popliteal artery-and RLE angiogram on 3/18 with angioplasty of right superficial femoral artery, and  balloon angioplasty of previous right femoral stent However rest pain in her left lower extremity persists but is much better than the past few days She continues to have gangrenous toes of her LLE, plan is for discharge home today with outpatient follow-up with vascular surgery for consideration of transmetatarsal amputation. Patient will continue with DAPT/statin Please note-patient has been very anxious to be discharged home for the past several days.   Recent GI bleeding due to gastrointestinal AVMs Per patient-brown stools Will continue PPI Follow Hb trend as an outpatient   Chronic combined HFpEF/HFrEF Euvolemic Continue Entresto    Hypokalemia Repleted   Hyponatremia Mild Follow for now   Acute urinary retention Occurred on 3/17 night-none since then.  Briefly required in and out catheterization.  Suspect this was due to pain. Per patient/spouse at bedside-patient now voiding spontaneously.   HTN BP now stable Continue Coreg/Entresto   DM-2 Resume Metformin on discharge  COPD Continue bronchodilators   IBS-diarrhea predominant Supportive care   GERD PPI   BMI Estimated body mass index is 22.06 kg/m as calculated from the following:   Height as of this encounter: 5\' 3"  (1.6 m).   Weight as of this encounter: 56.5 kg.   Discharge Diagnoses:  Principal Problem:   Lower limb ischemia Active Problems:   Critical limb ischemia of left lower extremity with ulceration of lower leg [facility])   Discharge Instructions:  Activity:  As tolerated with Full fall precautions use walker/cane & assistance as needed   Discharge Instructions     Call MD for:  redness, tenderness, or signs of infection (pain, swelling, redness, odor or green/yellow discharge around incision site)   Complete by: As directed    Call MD for:  severe uncontrolled pain   Complete by: As directed    Diet - low sodium heart healthy   Complete by: As directed    Diet Carb Modified    Complete by: As directed    Discharge instructions   Complete by: As directed    Follow with Primary MD  Tammy Coke, PA in 1-2 weeks  Follow-up with vascular surgery-Dr. Luan Pulling as instructed.  Please get a complete blood count and chemistry panel checked by your Primary MD at your next visit, and again as instructed by your Primary MD.  Get Medicines reviewed and adjusted: Please take all your medications with you for your next visit with your Primary MD  Laboratory/radiological data: Please request your Primary MD to go over all hospital tests and procedure/radiological results at the follow up, please ask your Primary MD to get all Hospital records sent to his/her office.  In some cases, they will be blood work, cultures and biopsy results pending at the time of your discharge. Please request that your primary care M.D. follows up on these results.  Also Note the following: If you experience worsening of your admission symptoms, develop shortness of breath, life threatening emergency, suicidal or homicidal thoughts you must seek medical attention immediately by calling 911 or calling your MD immediately  if symptoms less severe.  You must read complete instructions/literature along with all the possible adverse reactions/side effects for all the Medicines you take and that have been prescribed to you. Take any new Medicines after you have completely understood and accpet all the possible adverse reactions/side effects.   Do not drive when taking Pain medications or sleeping medications (Benzodaizepines)  Do not take more than prescribed Pain, Sleep and Anxiety Medications. It is not advisable to combine anxiety,sleep and pain medications without talking with your primary care practitioner  Special Instructions: If you have smoked or chewed Tobacco  in the last 2 yrs please stop smoking, stop any regular Alcohol  and or any Recreational drug use.  Wear Seat belts while  driving.  Please note: You were cared for by a hospitalist during your hospital stay. Once you are discharged, your primary care physician will handle any further medical issues. Please note that NO REFILLS for any discharge medications will be authorized once you are discharged, as it is imperative that you return to your primary care physician (or establish a relationship with a primary care physician if you do not have one) for your post hospital discharge needs so that they can reassess your need for medications and monitor your lab values.   Increase activity slowly   Complete by: As directed       Allergies as of 12/19/2022       Reactions   Bee Venom Anaphylaxis, Swelling   Codeine Anaphylaxis, Other (See Comments)   Tongue swelled   Penicillins Anaphylaxis, Swelling, Rash   Has patient had a PCN reaction causing immediate rash, facial/tongue/throat swelling, SOB or lightheadedness with hypotension: No, delayed Has patient had a PCN reaction causing severe rash involving mucus membranes or skin necrosis: No Has patient had a PCN reaction that required hospitalization No Has patient had a PCN reaction occurring within the last 10 years: No If all of the above answers are "NO", then may proceed with Cephalosporin use.   Bupropion Other (See  Comments)   "Made my skin crawl"   Sudafed [pseudoephedrine Hcl] Rash        Medication List     STOP taking these medications    traMADol 50 MG tablet Commonly known as: ULTRAM       TAKE these medications    Acetaminophen 500 MG capsule Take 500-1,000 mg by mouth every 6 (six) hours as needed for pain (or headaches).   albuterol (2.5 MG/3ML) 0.083% nebulizer solution Commonly known as: PROVENTIL Take 3 mLs (2.5 mg total) by nebulization every 4 (four) hours as needed for wheezing or shortness of breath. What changed: Another medication with the same name was changed. Make sure you understand how and when to take each.    albuterol 108 (90 Base) MCG/ACT inhaler Commonly known as: VENTOLIN HFA INHALE TWO PUFFS INTO THE LUNGS EVERY SIX HOURS AS NEEDED FOR WHEEZING OR SHORTNESS OF BREATH What changed:  how much to take how to take this when to take this reasons to take this additional instructions   ALPRAZolam 0.5 MG tablet Commonly known as: XANAX TAKE ONE TABLET (0.5MG  TOTAL) BY MOUTH DAILY AS NEEDED FOR ANXIETY What changed:  how much to take how to take this when to take this reasons to take this additional instructions   aspirin 81 MG chewable tablet Chew 1 tablet (81 mg total) by mouth every morning.   atorvastatin 80 MG tablet Commonly known as: LIPITOR Take 1 tablet (80 mg total) by mouth daily. What changed: when to take this   blood glucose meter kit and supplies Dispense based on patient and insurance preference. Use up to four times daily as directed. (FOR ICD-10 E10.9, E11.9).   clopidogrel 75 MG tablet Commonly known as: PLAVIX Take 1 tablet (75 mg total) by mouth daily. TAKE ONE TABLET (75MG  TOTAL) BY MOUTH DAILY WITH BREAKFAST What changed:  when to take this additional instructions   cyanocobalamin 1000 MCG/ML injection Commonly known as: VITAMIN B12 INJECT 1 ML INTO THE MUSCLE EVERY 30 DAYS. What changed: See the new instructions.   cyclobenzaprine 5 MG tablet Commonly known as: FLEXERIL Take 1 tablet (5 mg total) by mouth 3 (three) times daily as needed for muscle spasms.   Entresto 49-51 MG Generic drug: sacubitril-valsartan Take 1 tablet by mouth 2 (two) times daily. What changed: when to take this   FLUoxetine 40 MG capsule Commonly known as: PROZAC Take 1 capsule (40 mg total) by mouth daily.   FreeStyle [place] 2 Reader [facility] Check blood glucose up to 4 times daily   FreeStyle Libre 2 Sensor Misc Apply one sensor to the upper arm avery 14 days. What changed:  how much to take how to take this when to take this additional instructions   gabapentin  300 MG capsule Commonly known as: NEURONTIN TAKE ONE CAPSULE (300 MG TOTAL) BY MOUTHTWO TIMES DAILY. What changed:  how much to take how to take this when to take this additional instructions   glucose blood test strip Commonly known as: OneTouch Ultra Use as instructed   glucose monitoring kit monitoring kit Use to check blood sugar BID as directed   iron polysaccharides 150 MG capsule Commonly known as: NIFEREX Take 1 capsule (150 mg total) by mouth daily.   metFORMIN 500 MG 24 hr tablet Commonly known as: GLUCOPHAGE-XR Take 1 tablet (500 mg total) by mouth daily with breakfast. What changed:  when to take this reasons to take this   Misc. Devices Misc Please provide supplies (needle,  syringe, alcohol swabs) needed for patient to self-administer B-12 injections monthly.   OneTouch Delica Plus 0000000 Misc 1 each by Does not apply route 2 (two) times a day.   oxyCODONE 5 MG immediate release tablet Commonly known as: Oxy IR/ROXICODONE Take 1 tablet (5 mg total) by mouth every 6 (six) hours as needed for moderate pain.   pantoprazole 40 MG tablet Commonly known as: PROTONIX TAKE ONE (1) TABLET 30 MINS BEFORE YOUR FIRST MEAL. What changed:  how much to take how to take this when to take this additional instructions   UltiCare Tuberculin Safety Syr 25G X 1" 1 ML Misc Generic drug: Tuberculin-Allergy Syringes USE TO ADMINISTER INJECTABLE VITAMIN B12.   Vitamin D 50 MCG (2000 UT) Caps Take 1 capsule (2,000 Units total) by mouth daily.        Allergies  Allergen Reactions   Bee Venom Anaphylaxis and Swelling   Codeine Anaphylaxis and Other (See Comments)    Tongue swelled   Penicillins Anaphylaxis, Swelling and Rash    Has patient had a PCN reaction causing immediate rash, facial/tongue/throat swelling, SOB or lightheadedness with hypotension: No, delayed Has patient had a PCN reaction causing severe rash involving mucus membranes or skin necrosis: No Has  patient had a PCN reaction that required hospitalization No Has patient had a PCN reaction occurring within the last 10 years: No If all of the above answers are "NO", then may proceed with Cephalosporin use.    Bupropion Other (See Comments)    "Made my skin crawl"   Sudafed [Pseudoephedrine Hcl] Rash     Other Procedures/Studies: PERIPHERAL VASCULAR CATHETERIZATION  Result Date: 12/19/2022 Table formatting from the original result was not included. PATIENT: Tammy Mcpherson      MRN: [id number] DOB: Dec 26, 1955    DATE OF PROCEDURE: 12/18/2022 INDICATIONS:  Tammy Mcpherson is a 67 y.o. female who had presented with critical limb ischemia bilaterally.  She underwent left lower extremity intervention last week and presents now for intervention on the right for disease in the superficial femoral artery PROCEDURE:  Ultrasound-guided access to the left common femoral artery Selective catheterization of the right superficial femoral artery (third order catheterization) Right lower extremity arteriogram Angioplasty and stenting of the right superficial femoral artery Drug-coated balloon angioplasty of in stent stenosis of previous right femoral stent. SURGEON: Tammy Cornfield. Scot Dock, MD, FACS ANESTHESIA: Local EBL: Minimal TECHNIQUE: The patient was brought to the peripheral vascular lab and was not sedated as she was already somewhat sedated.  The patient's heart rate, blood pressure, and oxygen saturation were monitored by the nurse continuously during the procedure. Both groins were prepped and draped in the usual sterile fashion.  Under ultrasound guidance, after the skin was anesthetized, I cannulated the left common femoral artery with a micropuncture needle and a micropuncture sheath was introduced over a wire.  This was exchanged for a 5 [place] sheath over a Bentson wire.  By ultrasound the femoral artery was patent. A real-time image was obtained and sent to the server. I advanced the Glidewire advantage  into the infrarenal aorta and the Omni catheter was placed at the bifurcation and then positioned into the right common iliac artery.  The wire was advanced down into the external iliac artery.  I then exchanged the 5 French sheath for a 6 French 45 cm catapulted sheath.  This was initially positioned in the common femoral artery.  Right lower extremity arteriograms obtained demonstrating the areas of concern in the superficial femoral  artery.  I then advanced the wire into the superficial femoral artery and advanced the sheath using the dilator into the superficial femoral artery.  The patient was heparinized.  ACT was monitored throughout the procedure. There were 2 tandem stenoses in the superficial femoral artery above the previous stent.  The proximal stenosis was about 50% and the more distal stenosis was a focal 90% stenosis.  I elected to address this with drug-coated balloon angioplasty.  A 5 mm x 60 mm Zilver stent was selected and positioned across the 2 stenoses and deployed without difficulty.  Postdilatation was done with a 4 x 60 balloon.  There was an excellent result.  I then addressed the in stent stenoses within the previous placed stent with a Impact drug-coated balloon.  This was a 4 mm x 120 mm balloon.  This was inflated to nominal pressure for 3 minutes.  Completion film showed excellent result.  However I was not happy with the area between the 2 stents and elected to address this with a second stent.  And an oval 5 x 40 stent was selected and deployed overlapping both previous stents without difficulty.  Postdilatation was done with the 4 mm x 60 mm balloon which was inflated to nominal pressure for 1 minute.  Completion film showed an excellent result.  We then did runoff which showed no disease distally. At the completion of the procedure the sheath was retracted into the external iliac artery on the left and removed over a wire.  A Perclose device was placed twice but at both times the  suture broke and was not successful and we therefore replaced this with a 7 [place] sheath.  This will be removed manually.  Patient tolerated the procedure well was transferred to the holding area in stable condition. FINDINGS: There is mild disease in the right common femoral artery.  The deep femoral artery is patent.  The superficial femoral artery is patent proximally but then had 2 tandem stenoses in the proximal to mid thigh.  The proximal stenosis was about 50% and there was a focal 90% stenosis distal to that.  These were addressed with drug-coated balloon angioplasty as described above.  ` The in-stent stenoses were addressed with a 4 x 120 Impact balloon as described above.  There was no residual stenosis.  The area between the 2 is stents was addressed with no residual stenosis. Distally, the popliteal artery, anterior tibial, posterior tibial, peroneal and tibial peroneal trunk arteries were patent. Tammy Mayo, MD, FACS [facility]   PERIPHERAL VASCULAR CATHETERIZATION  Result Date: 12/15/2022 DATE OF SERVICE: 12/15/2022  PATIENT:  Tammy Mcpherson  68 y.o. female  PRE-OPERATIVE DIAGNOSIS:  Atherosclerosis of native arteries of bilateral lower extremities causing gangrene  POST-OPERATIVE DIAGNOSIS:  Same  PROCEDURE:  1) Ultrasound guided right common femoral artery access 2) Aortogram 3) Left lower extremity angiogram with third order cannulation (187mL total contrast) 4) Left femoropopliteal angioplasty and stenting (5x180mm, 5x149mm, 5x34mm Zilver) 5) Right lower extremity angiogram 6) Conscious sedation (47 minutes)  SURGEON:  Yevonne Aline. Stanford Breed, MD  ASSISTANT: none  ANESTHESIA:   local and IV sedation  ESTIMATED BLOOD LOSS: minimal  LOCAL MEDICATIONS USED:  LIDOCAINE  COUNTS: confirmed correct.  PATIENT DISPOSITION:  PACU - hemodynamically stable.  Delay start of Pharmacological VTE agent (>24hrs) due to surgical blood loss or risk of bleeding: no  INDICATION  FOR PROCEDURE: Tammy Mcpherson is a 67 y.o. female with bilateral lower  extremity gangrene. After careful discussion of risks, benefits, and alternatives the patient was offered angiography. The patient understood and wished to proceed.  OPERATIVE FINDINGS: Globally diminutive arteries Healthy femoropopliteal vessels measuring ~34mm Tibial vessels measuring <19mm  Terminal aorta and iliac arteries: Patent  Right lower extremity: Common femoral artery: patent Profunda femoris artery: patent Superficial femoral artery: diffuse disease; multifocal severe stenosis greatest 70%; existing femoropopliteal stenting patent but with instent stenosis ~ 40%. Popliteal artery: patent above, behind and below knee Anterior tibial artery: patent Tibioperoneal trunk: patent Peroneal artery: patent Posterior tibial artery: patent Pedal circulation: disadvantaged  Left lower extremity: Common femoral artery: patent Profunda femoris artery: patent Superficial femoral artery: diffuse disease; proximal significant stenosis ~60%. Chronic total occlusion about Hunter's canal about 77mm with severe disease in reconstituted above knee popliteal artery. Popliteal artery: Remainder of popliteal artery without significant stenosis. Anterior tibial artery: patent Tibioperoneal trunk: patent Peroneal artery: patent Posterior tibial artery: patent Pedal circulation: disadvantaged  GLASS score. FP 3. IP 0. Stage II.  WIfI score. 2 / 3 /1. Stage IV.  DESCRIPTION OF PROCEDURE: After identification of the patient in the pre-operative holding area, the patient was transferred to the operating room. The patient was positioned supine on the operating room table.  Anesthesia was induced. The groins was prepped and draped in standard fashion. A surgical pause was performed confirming correct patient, procedure, and operative location.  The right groin was anesthetized with subcutaneous injection of 1% lidocaine. Using ultrasound guidance, the right common  femoral artery was accessed with micropuncture technique. Fluoroscopy was used to confirm cannulation over the femoral head. The 643F sheath was upsized to 28F.  A Benson wire was advanced into the distal aorta. Over the wire an omni flush catheter was advanced to the level of L2. Aortogram was performed - see above for details.  The left common iliac artery was selected with an omniflush catheter and glidewire advantage guidewire. The wire was advanced into the common femoral artery. Over the wire the omni flush catheter was advanced into the external iliac artery. Selective angiography was performed - see above for details.  The decision was made to intervene. The patient was heparinized with 6000 units of heparin. The 28F sheath was exchanged for a 43F x 45cm sheath. Selective angiography of the left lower extremity was performed prior to intervention.  The lesions were treated with: Left femoropopliteal angioplasty and stenting (5x127mm, 5x163mm, 5x68mm Zilver)  Completion angiography revealed: Resolution of L SFA / popliteal stenosis and occlusion.  A perclose device was used to close the arteriotomy. Hemostasis was adequate upon completion.  Conscious sedation was administered with the use of IV fentanyl and midazolam under continuous physician and nurse monitoring.  Heart rate, blood pressure, and oxygen saturation were continuously monitored.  Total sedation time was 47 minutes  Upon completion of the case instrument and sharps counts were confirmed correct. The patient was transferred to the PACU in good condition. I was present for all portions of the procedure.  PLAN: ASA 81mg  PO QD. Plavix 75mg  PO QD. High intensity statin therapy. Plan angiography Monday with focus on R leg.  Yevonne Aline. Stanford Breed, MD [facility] Phone Number: [phone] 12/15/2022 10:51 AM    DG Foot Complete Left  Result Date: 12/13/2022 CLINICAL DATA:  Dry gangrene. EXAM: LEFT FOOT - COMPLETE 3+  VIEW COMPARISON:  Foot x-rays 11/06/2022 FINDINGS: Patient is status post second and fifth toe amputation the level of the MTP joints.  No evidence for an acute fracture or dislocation. No bony lysis to suggest osteomyelitis. IMPRESSION: Stable.  No new or progressive interval findings. Electronically Signed   By: Misty Stanley M.D.   On: 12/13/2022 19:03   CT ABDOMEN PELVIS W CONTRAST  Result Date: 12/08/2022 CLINICAL DATA:  Acute generalized abdominal pain. EXAM: CT ABDOMEN AND PELVIS WITH CONTRAST TECHNIQUE: Multidetector CT imaging of the abdomen and pelvis was performed using the standard protocol following bolus administration of intravenous contrast. RADIATION DOSE REDUCTION: This exam was performed according to the departmental dose-optimization program which includes automated exposure control, adjustment of the mA and/or kV according to patient size and/or use of iterative reconstruction technique. CONTRAST:  71mL OMNIPAQUE IOHEXOL 350 MG/ML SOLN COMPARISON:  June 17, 2020. FINDINGS: Lower chest: No acute abnormality. Hepatobiliary: No focal liver abnormality is seen. Status post cholecystectomy. No biliary dilatation. Pancreas: Unremarkable. No pancreatic ductal dilatation or surrounding inflammatory changes. Spleen: Normal in size without focal abnormality. Adrenals/Urinary Tract: Adrenal glands are unremarkable. Kidneys are normal, without renal calculi, focal lesion, or hydronephrosis. Bladder is unremarkable. Stomach/Bowel: Stomach is within normal limits. Appendix appears normal. No evidence of bowel wall thickening, distention, or inflammatory changes. Vascular/Lymphatic: Aortic atherosclerosis. No enlarged abdominal or pelvic lymph nodes. Reproductive: Status post hysterectomy. No adnexal masses. Other: No abdominal wall hernia or abnormality. No abdominopelvic ascites. Musculoskeletal: No acute or significant osseous findings. IMPRESSION: No acute abnormality seen in the abdomen or pelvis.  Aortic Atherosclerosis (ICD10-I70.0). Electronically Signed   By: Marijo Conception M.D.   On: 12/08/2022 13:33   VAS [place] ABI WITH/WO TBI  Result Date: 12/05/2022  LOWER EXTREMITY DOPPLER STUDY Patient Name:  Tammy Mcpherson  Date of Exam:   12/05/2022 Medical Rec #: [id number]      Accession #:    [id number] Date of Birth: Feb 01, 1956     Patient Gender: F Patient Age:   34 years Exam [facility] Procedure:      VAS [place] ABI WITH/WO TBI Referring Phys: --------------------------------------------------------------------------------  Indications: Ulceration, and peripheral artery disease. Ulceration of the toes              of the left foot. Patient reports ulcers have been present              approximately 10 days. High Risk Factors: Current smoker.  Vascular Interventions: 06/01/2021                         Right great toe and second toe amputation.                         05/19/2021 Right popliteal angioplasty and stenting. Performing Technologist: Ronal Fear RVS, RCS  Examination Guidelines: A complete evaluation includes at minimum, Doppler waveform signals and systolic blood pressure reading at the level of bilateral brachial, anterior tibial, and posterior tibial arteries, when vessel segments are accessible. Bilateral testing is considered an integral part of a complete examination. Photoelectric Plethysmograph (PPG) waveforms and toe systolic pressure readings are included as required and additional duplex testing as needed. Limited examinations for reoccurring indications may be performed as noted.  ABI Findings: +---------+------------------+-----+-------------------+----------+ Right    Rt Pressure (mmHg)IndexWaveform           Comment    +---------+------------------+-----+-------------------+----------+ PTA      33                0.22 dampened monophasic           +---------+------------------+-----+-------------------+----------+  DP       30                0.20  dampened monophasic           +---------+------------------+-----+-------------------+----------+ Great Toe                                          amputation +---------+------------------+-----+-------------------+----------+ +---------+------------------+-----+--------+-------+ Left     Lt Pressure (mmHg)IndexWaveformComment +---------+------------------+-----+--------+-------+ Brachial 151                                    +---------+------------------+-----+--------+-------+ PTA                             absent          +---------+------------------+-----+--------+-------+ DP                              absent          +---------+------------------+-----+--------+-------+ Great Toe                       Absent          +---------+------------------+-----+--------+-------+ +-------+-----------+-----------+------------+------------+ ABI/TBIToday's ABIToday's TBIPrevious ABIPrevious TBI +-------+-----------+-----------+------------+------------+ Right  0.22       amputation 0.90        amputation   +-------+-----------+-----------+------------+------------+ Left   absent     absent     0.97                     +-------+-----------+-----------+------------+------------+  Bilateral ABIs appear decreased compared to prior study on 09/06/2021.  Summary: Right: Resting right ankle-brachial index indicates critical limb ischemia. Left: No Doppler flow detected in the posterior tibial or dorsalis pedis artery.  *See table(s) above for measurements and observations.  Electronically signed by Harold Barban MD on 12/05/2022 at 10:16:39 PM.    Final      TODAY-DAY OF DISCHARGE:  Subjective:   Tammy Mcpherson today has no headache,no chest abdominal pain,no new weakness tingling or numbness, feels much better wants to go home today.   Objective:   Blood pressure (!) 133/48, pulse 86, temperature 98 F (36.7 C), temperature source Oral, resp. rate 18, height 5'  3" (1.6 m), weight 56.5 kg, SpO2 98 %.  Intake/Output Summary (Last 24 hours) at 12/19/2022 0855 Last data filed at 12/19/2022 0415 Gross per 24 hour  Intake 392.33 ml  Output 550 ml  Net -157.67 ml   Filed Weights   12/13/22 1724 12/17/22 0614  Weight: 61.2 kg 56.5 kg    Exam: Awake Alert, Oriented *3, No new F.N deficits, Normal affect [place].AT,PERRAL Supple Neck,No JVD, No cervical lymphadenopathy appriciated.  Symmetrical Chest wall movement, Good air movement bilaterally, CTAB RRR,No Gallops,Rubs or new Murmurs, No Parasternal Heave +ve B.Sounds, Abd Soft, Non tender, No organomegaly appriciated, No rebound -guarding or rigidity. No Cyanosis, Clubbing or edema, No new Rash or bruise   PERTINENT RADIOLOGIC STUDIES: PERIPHERAL VASCULAR CATHETERIZATION  Result Date: 12/19/2022 Table formatting from the original result was not included. PATIENT: EVIN SANDHU      MRN: [id number] DOB: [birth year]    DATE OF PROCEDURE: 12/18/2022 INDICATIONS:  Tammy Mcpherson is a 67 y.o. female  who had presented with critical limb ischemia bilaterally.  She underwent left lower extremity intervention last week and presents now for intervention on the right for disease in the superficial femoral artery PROCEDURE:  Ultrasound-guided access to the left common femoral artery Selective catheterization of the right superficial femoral artery (third order catheterization) Right lower extremity arteriogram Angioplasty and stenting of the right superficial femoral artery Drug-coated balloon angioplasty of in stent stenosis of previous right femoral stent. SURGEON: Tammy Cornfield. Scot Dock, MD, FACS ANESTHESIA: Local EBL: Minimal TECHNIQUE: The patient was brought to the peripheral vascular lab and was not sedated as she was already somewhat sedated.  The patient's heart rate, blood pressure, and oxygen saturation were monitored by the nurse continuously during the procedure. Both groins were prepped and draped in the usual  sterile fashion.  Under ultrasound guidance, after the skin was anesthetized, I cannulated the left common femoral artery with a micropuncture needle and a micropuncture sheath was introduced over a wire.  This was exchanged for a 5 [place] sheath over a Bentson wire.  By ultrasound the femoral artery was patent. A real-time image was obtained and sent to the server. I advanced the Glidewire advantage into the infrarenal aorta and the Omni catheter was placed at the bifurcation and then positioned into the right common iliac artery.  The wire was advanced down into the external iliac artery.  I then exchanged the 5 French sheath for a 6 French 45 cm catapulted sheath.  This was initially positioned in the common femoral artery.  Right lower extremity arteriograms obtained demonstrating the areas of concern in the superficial femoral artery.  I then advanced the wire into the superficial femoral artery and advanced the sheath using the dilator into the superficial femoral artery.  The patient was heparinized.  ACT was monitored throughout the procedure. There were 2 tandem stenoses in the superficial femoral artery above the previous stent.  The proximal stenosis was about 50% and the more distal stenosis was a focal 90% stenosis.  I elected to address this with drug-coated balloon angioplasty.  A 5 mm x 60 mm Zilver stent was selected and positioned across the 2 stenoses and deployed without difficulty.  Postdilatation was done with a 4 x 60 balloon.  There was an excellent result.  I then addressed the in stent stenoses within the previous placed stent with a Impact drug-coated balloon.  This was a 4 mm x 120 mm balloon.  This was inflated to nominal pressure for 3 minutes.  Completion film showed excellent result.  However I was not happy with the area between the 2 stents and elected to address this with a second stent.  And an oval 5 x 40 stent was selected and deployed overlapping both previous stents without  difficulty.  Postdilatation was done with the 4 mm x 60 mm balloon which was inflated to nominal pressure for 1 minute.  Completion film showed an excellent result.  We then did runoff which showed no disease distally. At the completion of the procedure the sheath was retracted into the external iliac artery on the left and removed over a wire.  A Perclose device was placed twice but at both times the suture broke and was not successful and we therefore replaced this with a 7 [place] sheath.  This will be removed manually.  Patient tolerated the procedure well was transferred to the holding area in stable condition. FINDINGS: There is mild disease in the right common femoral artery.  The  deep femoral artery is patent.  The superficial femoral artery is patent proximally but then had 2 tandem stenoses in the proximal to mid thigh.  The proximal stenosis was about 50% and there was a focal 90% stenosis distal to that.  These were addressed with drug-coated balloon angioplasty as described above.  ` The in-stent stenoses were addressed with a 4 x 120 Impact balloon as described above.  There was no residual stenosis.  The area between the 2 is stents was addressed with no residual stenosis. Distally, the popliteal artery, anterior tibial, posterior tibial, peroneal and tibial peroneal trunk arteries were patent. Tammy Mayo, MD, FACS [facility]     PERTINENT LAB RESULTS: CBC: Recent Labs    12/17/22 0124 12/18/22 1416  WBC 13.8* 9.6  HGB 9.1* 9.9*  HCT 32.0* 32.8*  PLT 474* 426*   CMET CMP     Component Value Date/Time   NA 131 (L) 12/19/2022 0103   NA 141 09/01/2020 1510   K 4.0 12/19/2022 0103   CL 97 (L) 12/19/2022 0103   CO2 24 12/19/2022 0103   GLUCOSE 78 12/19/2022 0103   BUN 8 12/19/2022 0103   BUN 7 (L) 09/01/2020 1510   CREATININE 0.55 12/19/2022 0103   CREATININE 0.64 08/12/2021 1446   CALCIUM 8.7 (L) 12/19/2022 0103   PROT 7.4 12/13/2022  1755   PROT 7.1 09/01/2020 1510   ALBUMIN 2.8 (L) 12/13/2022 1755   ALBUMIN 4.2 09/01/2020 1510   AST 12 (L) 12/13/2022 1755   ALT 10 12/13/2022 1755   ALKPHOS 125 12/13/2022 1755   BILITOT 0.2 (L) 12/13/2022 1755   BILITOT 0.3 09/01/2020 1510   GFRNONAA >60 12/19/2022 0103   GFRNONAA 90 09/28/2017 1201   GFRAA 115 09/01/2020 1510   GFRAA 104 09/28/2017 1201    GFR Estimated Creatinine Clearance: 58 mL/min (by C-G formula based on SCr of 0.55 mg/dL). No results for input(s): "LIPASE", "AMYLASE" in the last 72 hours. No results for input(s): "CKTOTAL", "CKMB", "CKMBINDEX", "TROPONINI" in the last 72 hours. Invalid input(s): "POCBNP" No results for input(s): "DDIMER" in the last 72 hours. No results for input(s): "HGBA1C" in the last 72 hours. Recent Labs    12/19/22 0103  CHOL 89  HDL 28*  LDLCALC 38  TRIG 115  CHOLHDL 3.2   No results for input(s): "TSH", "T4TOTAL", "T3FREE", "THYROIDAB" in the last 72 hours.  Invalid input(s): "FREET3" No results for input(s): "VITAMINB12", "FOLATE", "FERRITIN", "TIBC", "IRON", "RETICCTPCT" in the last 72 hours. Coags: No results for input(s): "INR" in the last 72 hours.  Invalid input(s): "PT" Microbiology: Recent Results (from the past 240 hour(s))  Blood culture (routine x 2)     Status: None   Collection Time: 12/13/22  5:58 PM   Specimen: BLOOD LEFT FOREARM  Result Value Ref Range Status   Specimen Description BLOOD LEFT FOREARM  Final   Special Requests   Final    BOTTLES DRAWN AEROBIC AND ANAEROBIC Blood Culture results may not be optimal due to an excessive volume of blood received in culture bottles   Culture   Final    NO GROWTH 5 DAYS Performed at [facility], [place] [address], [place], [place] [address]    Report Status 12/18/2022 FINAL  Final    FURTHER DISCHARGE INSTRUCTIONS:  Get Medicines reviewed and adjusted: Please take all your medications with you for your next visit with your Primary  MD  Laboratory/radiological data: Please request your Primary  MD to go over all hospital tests and procedure/radiological results at the follow up, please ask your Primary MD to get all Hospital records sent to his/her office.  In some cases, they will be blood work, cultures and biopsy results pending at the time of your discharge. Please request that your primary care M.D. goes through all the records of your hospital data and follows up on these results.  Also Note the following: If you experience worsening of your admission symptoms, develop shortness of breath, life threatening emergency, suicidal or homicidal thoughts you must seek medical attention immediately by calling 911 or calling your MD immediately  if symptoms less severe.  You must read complete instructions/literature along with all the possible adverse reactions/side effects for all the Medicines you take and that have been prescribed to you. Take any new Medicines after you have completely understood and accpet all the possible adverse reactions/side effects.   Do not drive when taking Pain medications or sleeping medications (Benzodaizepines)  Do not take more than prescribed Pain, Sleep and Anxiety Medications. It is not advisable to combine anxiety,sleep and pain medications without talking with your primary care practitioner  Special Instructions: If you have smoked or chewed Tobacco  in the last 2 yrs please stop smoking, stop any regular Alcohol  and or any Recreational drug use.  Wear Seat belts while driving.  Please note: You were cared for by a hospitalist during your hospital stay. Once you are discharged, your primary care physician will handle any further medical issues. Please note that NO REFILLS for any discharge medications will be authorized once you are discharged, as it is imperative that you return to your primary care physician (or establish a relationship with a primary care physician if you do not have  one) for your post hospital discharge needs so that they can reassess your need for medications and monitor your lab values.  Total Time spent coordinating discharge including counseling, education and face to face time equals greater than 30 minutes.  SignedOren Binet 12/19/2022 8:55 AM

## 2022-12-19 NOTE — Progress Notes (Signed)
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   Findings below intervention were Distally, the popliteal artery, anterior tibial, posterior tibial, peroneal and tibial peroneal trunk arteries were patent.     I will have her follow up with Dr. Stanford Breed next week to discuss left foot intervention for dry gangrene fore foot.  She is also followed by Dr. [name] who has performed her previous toe amputations.    PLAN: ASA 81mg  PO QD. Plavix 75mg  PO QD. High intensity statin therapy.   Roxy Horseman 12/19/2022 7:02 AM --  Laboratory Lab Results: Recent Labs    12/17/22 0124 12/18/22 1416  WBC 13.8* 9.6  HGB 9.1* 9.9*  HCT 32.0* 32.8*  PLT 474* 426*   BMET Recent Labs    12/18/22 0120 12/18/22 1416 12/19/22 0103  NA 130*  --  131*  K 4.8  --  4.0  CL 97*  --  97*  CO2 21*  --  24  GLUCOSE 132*  --  78  BUN 13  --  8  CREATININE 0.55 0.51 0.55  CALCIUM 8.7*  --  8.7*    COAG Lab Results  Component Value Date   INR 1.2 12/08/2022   INR 0.98 04/24/2017   No results found for: "PTT"

## 2022-12-19 NOTE — Progress Notes (Signed)
Occupational Therapy Treatment Patient Details Name: ANECIA SERVEN MRN: [id number] DOB: 05-Jan-1956 Today's Date: 12/19/2022   History of present illness 67 y/o F presenting  admitted 3/13 from PCP for L foot gangrene, s/p LLE angiogram femoropopliteal angoiplasty and stenting 3/15. RLE revascularization 3/18. PMH includes HTN, DM2, COPD, recent hospitalization for GI bleed from AVMs, PAD with chronic LLE ischemia.   OT comments  Pt progressing towards goals this session, needing min guard-mod A for ADLs, min A for bed mobility, and min-mod A +2 for bed mobility/transfers with RW. Pt fatiguing quickly needing increased assist, HR up to 140bpm with mobility and SpO2 in 90's on 2L O2. Pt presenting with impairments listed below, will follow acutely. Recommend HHOT at d/c.   Recommendations for follow up therapy are one component of a multi-disciplinary discharge planning process, led by the attending physician.  Recommendations may be updated based on patient status, additional functional criteria and insurance authorization.    Follow Up Recommendations  Home health OT     Assistance Recommended at Discharge Intermittent Supervision/Assistance  Patient can return home with the following  A little help with walking and/or transfers;A lot of help with bathing/dressing/bathroom;Assistance with cooking/housework;Direct supervision/assist for medications management;Direct supervision/assist for financial management;Assist for transportation;Help with stairs or ramp for entrance   Equipment Recommendations  Other (comment) (RW)    Recommendations for Other Services PT consult    Precautions / Restrictions Precautions Precautions: Fall Precaution Comments: watch vitals Restrictions Weight Bearing Restrictions: No       Mobility Bed Mobility Overal bed mobility: Needs Assistance Bed Mobility: Supine to Sit, Sit to Supine     Supine to sit: Min assist Sit to supine: Min assist    General bed mobility comments: min A for trunk elevation and to manage BLE's    Transfers Overall transfer level: Needs assistance Equipment used: Rolling walker (2 wheels) Transfers: Sit to/from Stand, Bed to chair/wheelchair/BSC Sit to Stand: Min assist, Mod assist, +2 physical assistance     Step pivot transfers: Min assist, Mod assist, +2 physical assistance     General transfer comment: min-mod +2 for x4 standing transfers, pt weak reporting her legs are "giving out"     Balance Overall balance assessment: Needs assistance Sitting-balance support: No upper extremity supported Sitting balance-Leahy Scale: Fair     Standing balance support: Bilateral upper extremity supported, During functional activity, Reliant on assistive device for balance Standing balance-Leahy Scale: Poor                             ADL either performed or assessed with clinical judgement   ADL Overall ADL's : Needs assistance/impaired     Grooming: Min guard;Standing               Lower Body Dressing: Moderate assistance Lower Body Dressing Details (indicate cue type and reason): assist for donning L shoe, able to don R shoe using figure 4 Toilet Transfer: Minimal assistance;+2 for safety/equipment;Ambulation;Rolling walker (2 wheels);Regular Glass blower/designer Details (indicate cue type and reason): to [facility] on other side of room Toileting- Clothing Manipulation and Hygiene: Min guard Toileting - Clothing Manipulation Details (indicate cue type and reason): pericare in sitting     Functional mobility during ADLs: Minimal assistance;+2 for physical assistance      Extremity/Trunk Assessment Upper Extremity Assessment Upper Extremity Assessment: Generalized weakness   Lower Extremity Assessment Lower Extremity Assessment: Defer to PT evaluation  Vision   Vision Assessment?: No apparent visual deficits   Perception Perception Perception: Not tested    Praxis Praxis Praxis: Not tested    Cognition Arousal/Alertness: Awake/alert Behavior During Therapy: Flat affect Overall Cognitive Status: Impaired/Different from baseline Area of Impairment: Attention, Safety/judgement, Problem solving                   Current Attention Level: Sustained     Safety/Judgement: Decreased awareness of safety, Decreased awareness of deficits   Problem Solving: Slow processing, Decreased initiation, Difficulty sequencing, Requires verbal cues General Comments: declines use of RW for stand pivot transfer although reports she is more stable with RW        Exercises      Shoulder Instructions       General Comments HR up to 140bpm with mobility    Pertinent Vitals/ Pain       Pain Assessment Pain Assessment: No/denies pain  Home Living                                          Prior Functioning/Environment              Frequency  Min 2X/week        Progress Toward Goals  OT Goals(current goals can now be found in the care plan section)  Progress towards OT goals: Progressing toward goals  Acute Rehab OT Goals Patient Stated Goal: none stated OT Goal Formulation: With patient Time For Goal Achievement: 12/30/22 Potential to Achieve Goals: Good ADL Goals Pt Will Perform Lower Body Dressing: with min guard assist;sit to/from stand Pt Will Transfer to Toilet: with min guard assist;ambulating;regular height toilet Pt Will Perform Tub/Shower Transfer: Tub transfer;Shower transfer;ambulating;shower seat;with min guard assist  Plan Discharge plan remains appropriate;Frequency remains appropriate    Co-evaluation                 AM-PAC OT "6 Clicks" Daily Activity     Outcome Measure   Help from another person eating meals?: None Help from another person taking care of personal grooming?: A Little Help from another person toileting, which includes using toliet, bedpan, or urinal?: A  Little Help from another person bathing (including washing, rinsing, drying)?: A Lot Help from another person to put on and taking off regular upper body clothing?: A Little Help from another person to put on and taking off regular lower body clothing?: A Lot 6 Click Score: 17    End of Session Equipment Utilized During Treatment: Gait belt;Oxygen;Rolling walker (2 wheels) (2L)  OT Visit Diagnosis: Unsteadiness on feet (R26.81);Other abnormalities of gait and mobility (R26.89);Muscle weakness (generalized) (M62.81)   Activity Tolerance Patient tolerated treatment well   Patient Left in bed;with call bell/phone within reach;with bed alarm set;with family/visitor present   Nurse Communication Mobility status        Time: [id number] OT Time Calculation (min): 26 min  Charges: OT General Charges $OT Visit: 1 Visit OT Treatments $Self Care/Home Management : 8-22 mins  Renaye Rakers, OTD, OTR/L SecureChat Preferred Acute Rehab [phone] - [place] 12/19/2022, 9:58 AM

## 2022-12-20 ENCOUNTER — Telehealth: Payer: Self-pay

## 2022-12-20 ENCOUNTER — Telehealth: Payer: Self-pay | Admitting: Vascular Surgery

## 2022-12-20 ENCOUNTER — Other Ambulatory Visit: Payer: Self-pay

## 2022-12-20 DIAGNOSIS — I998 Other disorder of circulatory system: Secondary | ICD-10-CM

## 2022-12-20 NOTE — Transitions of Care (Post Inpatient/ED Visit) (Signed)
   12/20/2022  Name: Tammy Mcpherson MRN: [id number] DOB: [birth year]  Today's TOC FU Call Status: Today's TOC FU Call Status:: Unsuccessul Call (1st Attempt) Unsuccessful Call (1st Attempt) Date: 12/20/22  Attempted to reach the patient regarding the most recent Inpatient/ED visit.  Follow Up Plan: Additional outreach attempts will be made to reach the patient to complete the Transitions of Care (Post Inpatient/ED visit) call.     Enzo Montgomery, RN,BSN,CCM [facility] Care Management Care Management Community Coordinator Direct Phone: [phone] Toll Free: [phone] Fax: [phone]

## 2022-12-20 NOTE — Telephone Encounter (Signed)
-----   Message from [name], MD sent at 12/15/2022 11:02 AM EDT ----- Tammy Mcpherson 12/15/2022 Procedure: 1) Ultrasound guided right common femoral artery access 2) Aortogram 3) Left lower extremity angiogram with third order cannulation (140mL total contrast) 4) Left femoropopliteal angioplasty and stenting (5x133mm, 5x169mm, 5x20mm Zilver) 5) Right lower extremity angiogram 6) Conscious sedation (47 minutes)  Assistant: none Follow up: 4 weeks with me  Studies for follow up: ABI and BLE arterial duplex  Thank you! Gershon Mussel

## 2022-12-21 ENCOUNTER — Telehealth: Payer: Self-pay

## 2022-12-21 ENCOUNTER — Ambulatory Visit: Payer: PPO | Admitting: Physician Assistant

## 2022-12-21 NOTE — Transitions of Care (Post Inpatient/ED Visit) (Signed)
   12/21/2022  Name: Tammy Mcpherson MRN: [id number] DOB: [birth year]  Today's TOC FU Call Status: Today's TOC FU Call Status:: Unsuccessful Call (2nd Attempt) Unsuccessful Call (2nd Attempt) Date: 12/21/22  Attempted to reach the patient regarding the most recent Inpatient/ED visit.  Follow Up Plan: Additional outreach attempts will be made to reach the patient to complete the Transitions of Care (Post Inpatient/ED visit) call.    Enzo Montgomery, RN,BSN,CCM [facility] Care Management Care Management Community Coordinator Direct Phone: [phone] Toll Free: [phone] Fax: [phone]

## 2022-12-21 NOTE — Transitions of Care (Post Inpatient/ED Visit) (Signed)
   12/21/2022  Name: Tammy Mcpherson MRN: [id number] DOB: 04-11-1956  Today's TOC FU Call Status: Today's TOC FU Call Status:: Unsuccessful Call (3rd Attempt) Unsuccessful Call (3rd Attempt) Date: 12/21/22  Attempted to reach the patient regarding the most recent Inpatient/ED visit.  Follow Up Plan: No further outreach attempts will be made at this time. We have been unable to contact the patient.    Enzo Montgomery, RN,BSN,CCM [facility] Care Management Care Management Community Coordinator Direct Phone: [phone] Toll Free: [phone] Fax: [phone]

## 2022-12-25 ENCOUNTER — Telehealth: Payer: Self-pay | Admitting: Physician Assistant

## 2022-12-25 ENCOUNTER — Telehealth: Payer: Self-pay | Admitting: Gastroenterology

## 2022-12-25 NOTE — Telephone Encounter (Signed)
[name]:  PCP reached out to me asking that we get patient in expeditiously due to recent hospitalization for severe anemia. Any APP at this point, as I may not have a follow-up available. If I do, that is fine. Probably within next 2 weeks.

## 2022-12-25 NOTE — Telephone Encounter (Signed)
Copied from [place] [id number]. Topic: Medicare AWV >> Dec 25, 2022  9:26 AM Gillis Santa wrote: Called patient to schedule Medicare Annual Wellness Visit (AWV). Left message for patient to call back and schedule Medicare Annual Wellness Visit (AWV).  Last date of AWV: N/A  Please schedule an appointment at any time with Otila Kluver, [facility].  Please schedule AWVI with Otila Kluver, [place].  Call me at [phone] if any questions.  Anthony Direct Dial [phone]

## 2022-12-27 ENCOUNTER — Ambulatory Visit (HOSPITAL_COMMUNITY)
Admission: RE | Admit: 2022-12-27 | Discharge: 2022-12-27 | Disposition: A | Discharge status: Home / Self Care | Payer: PPO | Source: Ambulatory Visit | Attending: Vascular Surgery | Admitting: Vascular Surgery

## 2022-12-27 ENCOUNTER — Ambulatory Visit (INDEPENDENT_AMBULATORY_CARE_PROVIDER_SITE_OTHER)
Admission: RE | Admit: 2022-12-27 | Discharge: 2022-12-27 | Disposition: A | Discharge status: Home / Self Care | Payer: PPO | Source: Ambulatory Visit | Attending: Vascular Surgery | Admitting: Vascular Surgery

## 2022-12-27 ENCOUNTER — Other Ambulatory Visit: Payer: Self-pay | Admitting: Acute Care

## 2022-12-27 DIAGNOSIS — Z122 Encounter for screening for malignant neoplasm of respiratory organs: Secondary | ICD-10-CM

## 2022-12-27 DIAGNOSIS — I998 Other disorder of circulatory system: Secondary | ICD-10-CM | POA: Diagnosis not present

## 2022-12-27 DIAGNOSIS — Z87891 Personal history of nicotine dependence: Secondary | ICD-10-CM

## 2022-12-27 DIAGNOSIS — F17210 Nicotine dependence, cigarettes, uncomplicated: Secondary | ICD-10-CM

## 2022-12-28 LAB — VAS US ABI WITH/WO TBI
Left ABI: 0.66
Right ABI: 0.86

## 2023-01-01 NOTE — Progress Notes (Unsigned)
HISTORY AND PHYSICAL     CC:  follow up. Requesting Provider:  Jarold Motto, PA  HPI: This is a 67 y.o. female  who with dry gangrene of the right second toe and ulceration of the right great toe. She has hx of RLE arteriogram with right popliteal angioplasty and stenting and right AT angioplasty by Dr. Lenell Antu on 05/19/2021 for rest pain and gangrene.   Her Arteriogram findings of the RLE were : Common femoral artery: patent without flow limiting stenosis  Profunda femoris artery: patent without flow limiting stenosis   Superficial femoral artery: diffusely diseased Popliteal artery: above knee diffusely diseased (>95% stenosis). Behind knee and below knee patent. Anterior tibial artery: chronic occlusion at the origin causing critical stenosis of terminal popliteal / proximal TP trunk. Patent beyond the occlusion. Tibioperoneal trunk: patent without flow limiting stenosis Peroneal artery: patent without flow limiting stenosis Posterior tibial artery: patent without flow limiting stenosis Pedal circulation: pedal arch intact   On 05/27/2021, her duplex revealed adequate perfusion following recent revascularization and she was scheduled for right 1st and 2nd toe amputation, which she underwent on 06/01/2021 by Dr. Lenell Antu.   He did have to explant a screw in the right great toe.   Pt was last seen 09/06/2021 and at that time, her amputation sites were healing.  She was still smoking.  She was compliant with her asa/statin/Plavix.  ABI on the right was improved.  She was scheduled for 9 month follow up.   The pt returns today for follow up and here with her husband.  She states she has been having pain in her feet (left worse than right) for a couple of weeks.  She has been weak and unable to walk.  She has wounds on both feet.  She states she has to hang her feet off the bed at night to help with the pain.    The pt is on a statin for cholesterol management.    The pt is  on an aspirin.     Other AC:  Plavix The pt is on ARB for hypertension.  The pt does  have diabetes. Tobacco hx:  current   Past Medical History:  Diagnosis Date   Anemia 04/30/2016   Anxiety    Arthritis    Cardiomyopathy (HCC)    a. EF 40-45% by echo in 04/2016 b. Improved to 60-65% by repeat imaging in 2018   COPD (chronic obstructive pulmonary disease) (HCC)    Coronary artery calcification seen on CT scan    Critical limb ischemia of both lower extremities (HCC)    Essential hypertension    GERD (gastroesophageal reflux disease)    Headache    History of bronchitis    History of GI bleed    History of hiatal hernia    Hyperlipidemia    Iron deficiency anemia    Peripheral vascular disease (HCC)    Pollen allergy    PSVT (paroxysmal supraventricular tachycardia)    PVC's (premature ventricular contractions)    Type 2 diabetes mellitus (HCC)    Vitamin B12 deficiency     Past Surgical History:  Procedure Laterality Date   ABDOMINAL AORTOGRAM W/LOWER EXTREMITY Bilateral 05/19/2021   Procedure: ABDOMINAL AORTOGRAM W/LOWER EXTREMITY;  Surgeon: [name], MD;  [facility]: [facility];  Service: Cardiovascular;  Laterality: Bilateral;   ABDOMINAL AORTOGRAM W/LOWER EXTREMITY N/A 12/15/2022   Procedure: ABDOMINAL AORTOGRAM W/LOWER EXTREMITY;  Surgeon: [name], MD;  Location: [facility];  Service: Cardiovascular;  Laterality: N/A;   ABDOMINAL AORTOGRAM W/LOWER EXTREMITY N/A 12/18/2022   Procedure: ABDOMINAL AORTOGRAM W/LOWER EXTREMITY;  Surgeon: Chuck Hint, MD;  Location: [facility] INVASIVE CV LAB;  Service: Cardiovascular;  Laterality: N/A;   ABDOMINAL HYSTERECTOMY     polyps  about age 50   AIKEN OSTEOTOMY Right 07/10/2013   Procedure: Quintella Reichert OSTEOTOMY RIGHT FOOT;  Surgeon: Dallas Schimke, DPM;  Location: [facility];  Service: Orthopedics;  Laterality: Right;   AMPUTATION Left 05/09/2017   Procedure: AMPUTATION 5TH TOE LEFT FOOT;  Surgeon: Ferman Hamming,  DPM;  Location: [facility];  Service: Podiatry;  Laterality: Left;   AMPUTATION Left 06/13/2017   Procedure: AMPUTATION 2ND TOE LEFT FOOT;  Surgeon: Ferman Hamming, DPM;  Location: [facility];  Service: Podiatry;  Laterality: Left;   AMPUTATION Right 06/01/2021   Procedure: Right great toe and Second Toe Amputation;  Surgeon: [name], MD;  Location: [facility] OR;  Service: Vascular;  Laterality: Right;   BIOPSY  10/27/2022   Procedure: BIOPSY;  Surgeon: Corbin Ade, MD;  Location: [facility];  Service: Endoscopy;;   BUNIONECTOMY Right 07/10/2013   Procedure: Ernesta Amble RIGHT FOOT;  Surgeon: Dallas Schimke, DPM;  Location: [facility];  Service: Orthopedics;  Laterality: Right;   CHOLECYSTECTOMY N/A 08/22/2017   Procedure: LAPAROSCOPIC CHOLECYSTECTOMY;  Surgeon: Lucretia Roers, MD;  Location: [facility];  Service: General;  Laterality: N/A;   COLONOSCOPY N/A 07/07/2016   Dr. Darrick Penna; redundant left colon, diverticulosis at hepatic flexure, non-bleeding internal hemorrhoids   ENTEROSCOPY N/A 10/27/2022   Procedure: ENTEROSCOPY;  Surgeon: Corbin Ade, MD;  [facility]: [facility];  Service: Endoscopy;  Laterality: N/A;   ENTEROSCOPY N/A 12/09/2022   Procedure: ENTEROSCOPY;  Surgeon: Benancio Deeds, MD;  Location: [facility] ENDOSCOPY;  Service: Gastroenterology;  Laterality: N/A;   ESOPHAGOGASTRODUODENOSCOPY N/A 07/07/2016   Dr. Darrick Penna: many non-bleeding cratered gastric ulcers without stigmata of bleeding in gastric antrum. four 2-3 mm angioectasias without bleeding in duodenal bulb and second portion of duodenum s/p APC. Chroni gastritis on path.    ESOPHAGOGASTRODUODENOSCOPY N/A 08/03/2017   Dr. Darrick Penna: erosive gastritis, AVMs. Found a single non-bleeding angioectasia in stomach, s/p APC therapy. Four non-bleeding angioectasias in duodenum s/p APC. Non-bleeding erosive gastropathy   HOT HEMOSTASIS  10/27/2022   Procedure: HOT HEMOSTASIS (ARGON PLASMA COAGULATION/BICAP);  Surgeon: Corbin Ade, MD;  [facility];  Service: Endoscopy;;   HOT HEMOSTASIS N/A 12/09/2022   Procedure: HOT HEMOSTASIS (ARGON PLASMA COAGULATION/BICAP);  Surgeon: Benancio Deeds, MD;  Location: [facility]COPY;  Service: Gastroenterology;  Laterality: N/A;   IR ANGIOGRAM EXTREMITY LEFT  04/24/2017   IR FEM POP ART ATHERECT INC PTA MOD SED  04/24/2017   IR INFUSION THROMBOL ARTERIAL INITIAL (MS)  04/24/2017   IR RADIOLOGIST EVAL & MGMT  12/05/2016   IR US GUIDE VASC ACCESS RIGHT  04/24/2017   LIVER BIOPSY N/A 08/22/2017   Procedure: LIVER BIOPSY;  Surgeon: Lucretia Roers, MD;  Location: [facility];  Service: General;  Laterality: N/A;   METATARSAL HEAD EXCISION Right 07/10/2013   Procedure: METATARSAL HEAD RESECTION OF DIGITS 2 AND 3 RIGHT FOOT;  Surgeon: Dallas Schimke, DPM;  Location: [facility];  Service: Orthopedics;  Laterality: Right;   PERIPHERAL VASCULAR INTERVENTION Right 05/19/2021   Procedure: PERIPHERAL VASCULAR INTERVENTION;  Surgeon: [name], MD;  [facility];  Service: Cardiovascular;  Laterality: Right;   PERIPHERAL VASCULAR INTERVENTION Left 12/15/2022  Procedure: PERIPHERAL VASCULAR INTERVENTION;  Surgeon: [name], MD;  [facility]: [facility];  Service: Cardiovascular;  Laterality: Left;  SFA   PERIPHERAL VASCULAR INTERVENTION Right 12/18/2022   Procedure: PERIPHERAL VASCULAR INTERVENTION;  Surgeon: Chuck Hint, MD;  [facility];  Service: Cardiovascular;  Laterality: Right;   PROXIMAL INTERPHALANGEAL FUSION (PIP) Right 07/10/2013   Procedure: ARTHRODESIS PIPJ  2ND DIGIT RIGHT FOOT;  Surgeon: Dallas Schimke, DPM;  Location: [facility];  Service: Orthopedics;  Laterality: Right;   SUBMUCOSAL TATTOO INJECTION  12/09/2022   Procedure: SUBMUCOSAL TATTOO INJECTION;  Surgeon: Benancio Deeds, MD;  Location: [facility];  Service: Gastroenterology;;    Allergies  Allergen Reactions   Bee Venom Anaphylaxis  and Swelling   Codeine Anaphylaxis and Other (See Comments)    Tongue swelled   Penicillins Anaphylaxis, Swelling and Rash    Has patient had a PCN reaction causing immediate rash, facial/tongue/throat swelling, SOB or lightheadedness with hypotension: No, delayed Has patient had a PCN reaction causing severe rash involving mucus membranes or skin necrosis: No Has patient had a PCN reaction that required hospitalization No Has patient had a PCN reaction occurring within the last 10 years: No If all of the above answers are "NO", then may proceed with Cephalosporin use.    Bupropion Other (See Comments)    "Made my skin crawl"   Sudafed [Pseudoephedrine Hcl] Rash    Current Outpatient Medications  Medication Sig Dispense Refill   Acetaminophen 500 MG capsule Take 500-1,000 mg by mouth every 6 (six) hours as needed for pain (or headaches).     albuterol (PROVENTIL) (2.5 MG/3ML) 0.083% nebulizer solution Take 3 mLs (2.5 mg total) by nebulization every 4 (four) hours as needed for wheezing or shortness of breath. 75 mL 5   albuterol (VENTOLIN HFA) 108 (90 Base) MCG/ACT inhaler INHALE TWO PUFFS INTO THE LUNGS EVERY SIX HOURS AS NEEDED FOR WHEEZING OR SHORTNESS OF BREATH (Patient taking differently: Inhale 2 puffs into the lungs every 6 (six) hours as needed for wheezing or shortness of breath.) 8.5 g 5   ALPRAZolam (XANAX) 0.5 MG tablet TAKE ONE TABLET (0.5MG  TOTAL) BY MOUTH DAILY AS NEEDED FOR ANXIETY (Patient taking differently: Take 0.5 mg by mouth daily as needed for anxiety.) 30 tablet 2   aspirin 81 MG chewable tablet Chew 1 tablet (81 mg total) by mouth every morning. 30 tablet 0   atorvastatin (LIPITOR) 80 MG tablet Take 1 tablet (80 mg total) by mouth daily. (Patient taking differently: Take 80 mg by mouth at bedtime.) 90 tablet 3   blood glucose meter kit and supplies Dispense based on patient and insurance preference. Use up to four times daily as directed. (FOR ICD-10 E10.9, E11.9). 1  each 0   Cholecalciferol (VITAMIN D) 50 MCG (2000 UT) CAPS Take 1 capsule (2,000 Units total) by mouth daily. 90 capsule 4   clopidogrel (PLAVIX) 75 MG tablet Take 1 tablet (75 mg total) by mouth daily. TAKE ONE TABLET (75MG  TOTAL) BY MOUTH DAILY WITH BREAKFAST (Patient taking differently: Take 75 mg by mouth at bedtime.) 90 tablet 1   Continuous Blood Gluc Receiver (FREESTYLE LIBRE 2 READER) DEVI Check blood glucose up to 4 times daily 1 each 0   Continuous Blood Gluc Sensor (FREESTYLE LIBRE 2 SENSOR) MISC Apply one sensor to the upper arm avery 14 days. (Patient taking differently: Inject 1 patch into the skin See admin instructions. Apply one sensor into the upper arm every  14 days) 6 each 1   cyanocobalamin (VITAMIN B12) 1000 MCG/ML injection INJECT 1 ML INTO THE MUSCLE EVERY 30 DAYS. (Patient taking differently: Inject 1,000 mcg into the skin every 30 (thirty) days.) 1 mL 11   cyclobenzaprine (FLEXERIL) 5 MG tablet Take 1 tablet (5 mg total) by mouth 3 (three) times daily as needed for muscle spasms. 45 tablet 3   FLUoxetine (PROZAC) 40 MG capsule Take 1 capsule (40 mg total) by mouth daily. 90 capsule 3   gabapentin (NEURONTIN) 300 MG capsule TAKE ONE CAPSULE (300 MG TOTAL) BY MOUTHTWO TIMES DAILY. (Patient taking differently: Take 300 mg by mouth 3 (three) times daily.) 180 capsule 1   glucose blood (ONETOUCH ULTRA) test strip Use as instructed 100 each 12   glucose monitoring kit (FREESTYLE) monitoring kit Use to check blood sugar BID as directed 1 each 0   iron polysaccharides (NIFEREX) 150 MG capsule Take 1 capsule (150 mg total) by mouth daily. 30 capsule 0   Lancets (ONETOUCH DELICA PLUS LANCET33G) MISC 1 each by Does not apply route 2 (two) times a day. 100 each 3   metFORMIN (GLUCOPHAGE-XR) 500 MG 24 hr tablet Take 1 tablet (500 mg total) by mouth daily with breakfast. 90 tablet 1   Misc. Devices MISC Please provide supplies (needle, syringe, alcohol swabs) needed for patient to  self-administer B-12 injections monthly. 1 each 12   oxyCODONE (OXY IR/ROXICODONE) 5 MG immediate release tablet Take 1 tablet (5 mg total) by mouth every 6 (six) hours as needed for moderate pain. 20 tablet 0   pantoprazole (PROTONIX) 40 MG tablet TAKE ONE (1) TABLET 30 MINS BEFORE YOUR FIRST MEAL. (Patient taking differently: Take 40 mg by mouth at bedtime.) 90 tablet 3   sacubitril-valsartan (ENTRESTO) 49-51 MG Take 1 tablet by mouth 2 (two) times daily. (Patient taking differently: Take 1 tablet by mouth in the morning and at bedtime.) 60 tablet 6   Tuberculin-Allergy Syringes (ULTICARE TUBERCULIN SAFETY SYR) 25G X 1" 1 ML MISC USE TO ADMINISTER INJECTABLE VITAMIN B12. 1 each 11   No current facility-administered medications for this visit.    Family History  Adopted: Yes  Problem Relation Age of Onset   Hypertension Father    Coronary artery disease Sister    Ulcers Maternal Grandmother    Colon cancer Neg Hx        unknown, was adopted    Social History   Socioeconomic History   Marital status: Married    Spouse name: [name]   Number of children: 1   Years of education: 12   Highest education level: Not on file  Occupational History   Occupation: disabled    Comment: heart  Tobacco Use   Smoking status: Light Smoker    Packs/day: 0.50    Years: 44.00    Additional pack years: 0.00    Total pack years: 22.00    Types: Cigarettes    Start date: 05/10/1975    Passive exposure: Never   Smokeless tobacco: Never   Tobacco comments:    2 cigarettes per day 12/10/2019  Vaping Use   Vaping Use: Former  Substance and Sexual Activity   Alcohol use: Not Currently    Alcohol/week: 2.0 standard drinks of alcohol    Types: 2 Glasses of wine per week   Drug use: No   Sexual activity: Yes    Birth control/protection: Surgical  Other Topics Concern   Not on file  Social History Narrative   Disabled  Lives with husband [name]   Two dogs   Social Determinants of Health    Financial Resource Strain: Not on file  Food Insecurity: No Food Insecurity (12/09/2022)   Hunger Vital Sign    Worried About Running Out of Food in the Last Year: Never true    Ran Out of Food in the Last Year: Never true  Transportation Needs: No Transportation Needs (12/09/2022)   PRAPARE - Administrator, Civil Service (Medical): No    Lack of Transportation (Non-Medical): No  Physical Activity: Not on file  Stress: Not on file  Social Connections: Not on file  Intimate Partner Violence: Not At Risk (12/09/2022)   Humiliation, Afraid, Rape, and Kick questionnaire    Fear of Current or Ex-Partner: No    Emotionally Abused: No    Physically Abused: No    Sexually Abused: No     REVIEW OF SYSTEMS:   [X]  denotes positive finding, [ ]  denotes negative finding Cardiac  Comments:  Chest pain or chest pressure:    Shortness of breath upon exertion:    Short of breath when lying flat:    Irregular heart rhythm:        Vascular    Pain in calf, thigh, or hip brought on by ambulation:    Pain in feet at night that wakes you up from your sleep:     Blood clot in your veins:    Leg swelling:         Pulmonary    Oxygen at home:    Productive cough:     Wheezing:         Neurologic    Sudden weakness in arms or legs:     Sudden numbness in arms or legs:     Sudden onset of difficulty speaking or slurred speech:    Temporary loss of vision in one eye:     Problems with dizziness:         Gastrointestinal    Blood in stool:     Vomited blood:         Genitourinary    Burning when urinating:     Blood in urine:        Psychiatric    Major depression:         Hematologic    Bleeding problems:    Problems with blood clotting too easily:        Skin    Rashes or ulcers:        Constitutional    Fever or chills:      PHYSICAL EXAMINATION:  There were no vitals filed for this visit.  There is no height or weight on file to calculate BMI.   General:   WDWN in NAD; vital signs documented above Gait: Not observed HENT: WNL, normocephalic Pulmonary: normal non-labored breathing , without wheezing Abdomen: soft Skin: without rashes Vascular Exam/Pulses: Bilateral femoral pulses are 1+ bilaterally Bilateral radial pulses 2+ bilaterally Pedal pulses are not palpable Extremities:   Left foot  Left medial foot  Left plantar foot   Right foot   Musculoskeletal: no muscle wasting or atrophy  Neurologic: A&O X 3 Psychiatric:  The pt has Normal affect.   Non-Invasive Vascular Imaging:   ABI's/TBI's on 12/05/2022: Right:  0.22/amp - Great toe pressure: amp Left:  absent - Great toe pressure: absent   Previous ABI on 09/06/2021: Right:  0.90 Left:  0.97  Previous arterial duplex on 09/06/2021: Summary:  Right:  Patent stent with no evidence of stenosis is the right distal SFA and proximal popliteal artery     ASSESSMENT/PLAN:: 67 y.o. female here for follow up for PAD with hx of  hx of RLE arteriogram with right popliteal angioplasty and stenting and right AT angioplasty by Dr. Lenell Antu on 05/19/2021 for rest pain and gangrene.     On 06/01/2021, she underwent right 1st and 2nd toe amputation by Dr. Lenell Antu.   He did have to explant a screw in the right great toe.    -pt ABI absent on the left and 0.22 on the right.  She has critical limb ischemia with wounds and rest pain.  She has palpable femoral pulses bilaterally.   -she will be scheduled for angiogram on Friday 12/08/2022 with Dr. Lenell Antu.  She requested pain medication, however on review of PDMP, she just received Tramadol 30 tablets on 2/28 and therefore I cannot prescribe her pain medication.  -discussed that she is at risk of amputation.  -discussed the importance of smoking cessation -continue asa/statin/plavix    Doreatha Massed, [facility] [phone]  Clinic MD:   Lenell Antu

## 2023-01-01 NOTE — H&P (View-Only) (Signed)
HISTORY AND PHYSICAL     CC:  follow up. Requesting Provider:  Worley, Samantha, PA  HPI: This is a 66 y.o. female  who with dry gangrene of the right second toe and ulceration of the right great toe. She has hx of RLE arteriogram with right popliteal angioplasty and stenting and right AT angioplasty by Dr. Patrisha Hausmann on 05/19/2021 for rest pain and gangrene.   Her Arteriogram findings of the RLE were : Common femoral artery: patent without flow limiting stenosis  Profunda femoris artery: patent without flow limiting stenosis   Superficial femoral artery: diffusely diseased Popliteal artery: above knee diffusely diseased (>95% stenosis). Behind knee and below knee patent. Anterior tibial artery: chronic occlusion at the origin causing critical stenosis of terminal popliteal / proximal TP trunk. Patent beyond the occlusion. Tibioperoneal trunk: patent without flow limiting stenosis Peroneal artery: patent without flow limiting stenosis Posterior tibial artery: patent without flow limiting stenosis Pedal circulation: pedal arch intact   On 05/27/2021, her duplex revealed adequate perfusion following recent revascularization and she was scheduled for right 1st and 2nd toe amputation, which she underwent on 06/01/2021 by Dr. Salman Wellen.   He did have to explant a screw in the right great toe.   Pt was last seen 09/06/2021 and at that time, her amputation sites were healing.  She was still smoking.  She was compliant with her asa/statin/Plavix.  ABI on the right was improved.  She was scheduled for 9 month follow up.   The pt returns today for follow up and here with her husband.  She states she has been having pain in her feet (left worse than right) for a couple of weeks.  She has been weak and unable to walk.  She has wounds on both feet.  She states she has to hang her feet off the bed at night to help with the pain.    The pt is on a statin for cholesterol management.    The pt is  on an aspirin.     Other AC:  Plavix The pt is on ARB for hypertension.  The pt does  have diabetes. Tobacco hx:  current   Past Medical History:  Diagnosis Date   Anemia 04/30/2016   Anxiety    Arthritis    Cardiomyopathy (HCC)    a. EF 40-45% by echo in 04/2016 b. Improved to 60-65% by repeat imaging in 2018   COPD (chronic obstructive pulmonary disease) (HCC)    Coronary artery calcification seen on CT scan    Critical limb ischemia of both lower extremities (HCC)    Essential hypertension    GERD (gastroesophageal reflux disease)    Headache    History of bronchitis    History of GI bleed    History of hiatal hernia    Hyperlipidemia    Iron deficiency anemia    Peripheral vascular disease (HCC)    Pollen allergy    PSVT (paroxysmal supraventricular tachycardia)    PVC's (premature ventricular contractions)    Type 2 diabetes mellitus (HCC)    Vitamin B12 deficiency     Past Surgical History:  Procedure Laterality Date   ABDOMINAL AORTOGRAM W/LOWER EXTREMITY Bilateral 05/19/2021   Procedure: ABDOMINAL AORTOGRAM W/LOWER EXTREMITY;  Surgeon: Omarr Hann N, MD;  [facility]: [facility];  Service: Cardiovascular;  Laterality: Bilateral;   ABDOMINAL AORTOGRAM W/LOWER EXTREMITY N/A 12/15/2022   Procedure: ABDOMINAL AORTOGRAM W/LOWER EXTREMITY;  Surgeon: Dvon Jiles N, MD;  [facility]: [facility];    Service: Cardiovascular;  Laterality: N/A;   ABDOMINAL AORTOGRAM W/LOWER EXTREMITY N/A 12/18/2022   Procedure: ABDOMINAL AORTOGRAM W/LOWER EXTREMITY;  Surgeon: Dickson, Christopher S, MD;  [facility]: [facility];  Service: Cardiovascular;  Laterality: N/A;   ABDOMINAL HYSTERECTOMY     polyps  about age 26   AIKEN OSTEOTOMY Right 07/10/2013   Procedure: AIKEN OSTEOTOMY RIGHT FOOT;  Surgeon: Benjamin Ivan McKinney, DPM;  Location: [facility];  Service: Orthopedics;  Laterality: Right;   AMPUTATION Left 05/09/2017   Procedure: AMPUTATION 5TH TOE LEFT FOOT;  Surgeon: McKinney, Benjamin,  DPM;  Location: [facility];  Service: Podiatry;  Laterality: Left;   AMPUTATION Left 06/13/2017   Procedure: AMPUTATION 2ND TOE LEFT FOOT;  Surgeon: McKinney, Benjamin, DPM;  Location: [facility];  Service: Podiatry;  Laterality: Left;   AMPUTATION Right 06/01/2021   Procedure: Right great toe and Second Toe Amputation;  Surgeon: Gracelin Weisberg N, MD;  Location: [facility];  Service: Vascular;  Laterality: Right;   BIOPSY  10/27/2022   Procedure: BIOPSY;  Surgeon: Rourk, Robert M, MD;  [facility]: [facility];  Service: Endoscopy;;   BUNIONECTOMY Right 07/10/2013   Procedure: VOGLER BUNIONECTOMY RIGHT FOOT;  Surgeon: Benjamin Ivan McKinney, DPM;  Location: [facility];  Service: Orthopedics;  Laterality: Right;   CHOLECYSTECTOMY N/A 08/22/2017   Procedure: LAPAROSCOPIC CHOLECYSTECTOMY;  Surgeon: Bridges, Lindsay C, MD;  Location: [facility];  Service: General;  Laterality: N/A;   COLONOSCOPY N/A 07/07/2016   Dr. Fields; redundant left colon, diverticulosis at hepatic flexure, non-bleeding internal hemorrhoids   ENTEROSCOPY N/A 10/27/2022   Procedure: ENTEROSCOPY;  Surgeon: Rourk, Robert M, MD;  [facility];  Service: Endoscopy;  Laterality: N/A;   ENTEROSCOPY N/A 12/09/2022   Procedure: ENTEROSCOPY;  Surgeon: Armbruster, Steven P, MD;  [facility];  Service: Gastroenterology;  Laterality: N/A;   ESOPHAGOGASTRODUODENOSCOPY N/A 07/07/2016   Dr. Fields: many non-bleeding cratered gastric ulcers without stigmata of bleeding in gastric antrum. four 2-3 mm angioectasias without bleeding in duodenal bulb and second portion of duodenum s/p APC. Chroni gastritis on path.    ESOPHAGOGASTRODUODENOSCOPY N/A 08/03/2017   Dr. Fields: erosive gastritis, AVMs. Found a single non-bleeding angioectasia in stomach, s/p APC therapy. Four non-bleeding angioectasias in duodenum s/p APC. Non-bleeding erosive gastropathy   HOT HEMOSTASIS  10/27/2022   Procedure: HOT HEMOSTASIS (ARGON PLASMA COAGULATION/BICAP);  Surgeon: Rourk,  Robert M, MD;  [facility];  Service: Endoscopy;;   HOT HEMOSTASIS N/A 12/09/2022   Procedure: HOT HEMOSTASIS (ARGON PLASMA COAGULATION/BICAP);  Surgeon: Armbruster, Steven P, MD;  [facility];  Service: Gastroenterology;  Laterality: N/A;   IR ANGIOGRAM EXTREMITY LEFT  04/24/2017   IR FEM POP ART ATHERECT INC PTA MOD SED  04/24/2017   IR INFUSION THROMBOL ARTERIAL INITIAL (MS)  04/24/2017   IR RADIOLOGIST EVAL & MGMT  12/05/2016   IR US GUIDE VASC ACCESS RIGHT  04/24/2017   LIVER BIOPSY N/A 08/22/2017   Procedure: LIVER BIOPSY;  Surgeon: Bridges, Lindsay C, MD;  Location: [facility];  Service: General;  Laterality: N/A;   METATARSAL HEAD EXCISION Right 07/10/2013   Procedure: METATARSAL HEAD RESECTION OF DIGITS 2 AND 3 RIGHT FOOT;  Surgeon: Benjamin Ivan McKinney, DPM;  Location: [facility];  Service: Orthopedics;  Laterality: Right;   PERIPHERAL VASCULAR INTERVENTION Right 05/19/2021   Procedure: PERIPHERAL VASCULAR INTERVENTION;  Surgeon: Damariz Paganelli N, MD;  [facility];  Service: Cardiovascular;  Laterality: Right;   PERIPHERAL VASCULAR INTERVENTION Left 12/15/2022     Procedure: PERIPHERAL VASCULAR INTERVENTION;  Surgeon: Ahmari Duerson N, MD;  [facility];  Service: Cardiovascular;  Laterality: Left;  SFA   PERIPHERAL VASCULAR INTERVENTION Right 12/18/2022   Procedure: PERIPHERAL VASCULAR INTERVENTION;  Surgeon: Dickson, Christopher S, MD;  [facility];  Service: Cardiovascular;  Laterality: Right;   PROXIMAL INTERPHALANGEAL FUSION (PIP) Right 07/10/2013   Procedure: ARTHRODESIS PIPJ  2ND DIGIT RIGHT FOOT;  Surgeon: Benjamin Ivan McKinney, DPM;  Location: [facility];  Service: Orthopedics;  Laterality: Right;   SUBMUCOSAL TATTOO INJECTION  12/09/2022   Procedure: SUBMUCOSAL TATTOO INJECTION;  Surgeon: Armbruster, Steven P, MD;  [facility];  Service: Gastroenterology;;    Allergies  Allergen Reactions   Bee Venom Anaphylaxis  and Swelling   Codeine Anaphylaxis and Other (See Comments)    Tongue swelled   Penicillins Anaphylaxis, Swelling and Rash    Has patient had a PCN reaction causing immediate rash, facial/tongue/throat swelling, SOB or lightheadedness with hypotension: No, delayed Has patient had a PCN reaction causing severe rash involving mucus membranes or skin necrosis: No Has patient had a PCN reaction that required hospitalization No Has patient had a PCN reaction occurring within the last 10 years: No If all of the above answers are "NO", then may proceed with Cephalosporin use.    Bupropion Other (See Comments)    "Made my skin crawl"   Sudafed [Pseudoephedrine Hcl] Rash    Current Outpatient Medications  Medication Sig Dispense Refill   Acetaminophen 500 MG capsule Take 500-1,000 mg by mouth every 6 (six) hours as needed for pain (or headaches).     albuterol (PROVENTIL) (2.5 MG/3ML) 0.083% nebulizer solution Take 3 mLs (2.5 mg total) by nebulization every 4 (four) hours as needed for wheezing or shortness of breath. 75 mL 5   albuterol (VENTOLIN HFA) 108 (90 Base) MCG/ACT inhaler INHALE TWO PUFFS INTO THE LUNGS EVERY SIX HOURS AS NEEDED FOR WHEEZING OR SHORTNESS OF BREATH (Patient taking differently: Inhale 2 puffs into the lungs every 6 (six) hours as needed for wheezing or shortness of breath.) 8.5 g 5   ALPRAZolam (XANAX) 0.5 MG tablet TAKE ONE TABLET (0.5MG TOTAL) BY MOUTH DAILY AS NEEDED FOR ANXIETY (Patient taking differently: Take 0.5 mg by mouth daily as needed for anxiety.) 30 tablet 2   aspirin 81 MG chewable tablet Chew 1 tablet (81 mg total) by mouth every morning. 30 tablet 0   atorvastatin (LIPITOR) 80 MG tablet Take 1 tablet (80 mg total) by mouth daily. (Patient taking differently: Take 80 mg by mouth at bedtime.) 90 tablet 3   blood glucose meter kit and supplies Dispense based on patient and insurance preference. Use up to four times daily as directed. (FOR ICD-10 E10.9, E11.9). 1  each 0   Cholecalciferol (VITAMIN D) 50 MCG (2000 UT) CAPS Take 1 capsule (2,000 Units total) by mouth daily. 90 capsule 4   clopidogrel (PLAVIX) 75 MG tablet Take 1 tablet (75 mg total) by mouth daily. TAKE ONE TABLET (75MG TOTAL) BY MOUTH DAILY WITH BREAKFAST (Patient taking differently: Take 75 mg by mouth at bedtime.) 90 tablet 1   Continuous Blood Gluc Receiver (FREESTYLE LIBRE 2 READER) DEVI Check blood glucose up to 4 times daily 1 each 0   Continuous Blood Gluc Sensor (FREESTYLE LIBRE 2 SENSOR) MISC Apply one sensor to the upper arm avery 14 days. (Patient taking differently: Inject 1 patch into the skin See admin instructions. Apply one sensor into the upper arm every   14 days) 6 each 1   cyanocobalamin (VITAMIN B12) 1000 MCG/ML injection INJECT 1 ML INTO THE MUSCLE EVERY 30 DAYS. (Patient taking differently: Inject 1,000 mcg into the skin every 30 (thirty) days.) 1 mL 11   cyclobenzaprine (FLEXERIL) 5 MG tablet Take 1 tablet (5 mg total) by mouth 3 (three) times daily as needed for muscle spasms. 45 tablet 3   FLUoxetine (PROZAC) 40 MG capsule Take 1 capsule (40 mg total) by mouth daily. 90 capsule 3   gabapentin (NEURONTIN) 300 MG capsule TAKE ONE CAPSULE (300 MG TOTAL) BY MOUTHTWO TIMES DAILY. (Patient taking differently: Take 300 mg by mouth 3 (three) times daily.) 180 capsule 1   glucose blood (ONETOUCH ULTRA) test strip Use as instructed 100 each 12   glucose monitoring kit (FREESTYLE) monitoring kit Use to check blood sugar BID as directed 1 each 0   iron polysaccharides (NIFEREX) 150 MG capsule Take 1 capsule (150 mg total) by mouth daily. 30 capsule 0   Lancets (ONETOUCH DELICA PLUS LANCET33G) MISC 1 each by Does not apply route 2 (two) times a day. 100 each 3   metFORMIN (GLUCOPHAGE-XR) 500 MG 24 hr tablet Take 1 tablet (500 mg total) by mouth daily with breakfast. 90 tablet 1   Misc. Devices MISC Please provide supplies (needle, syringe, alcohol swabs) needed for patient to  self-administer B-12 injections monthly. 1 each 12   oxyCODONE (OXY IR/ROXICODONE) 5 MG immediate release tablet Take 1 tablet (5 mg total) by mouth every 6 (six) hours as needed for moderate pain. 20 tablet 0   pantoprazole (PROTONIX) 40 MG tablet TAKE ONE (1) TABLET 30 MINS BEFORE YOUR FIRST MEAL. (Patient taking differently: Take 40 mg by mouth at bedtime.) 90 tablet 3   sacubitril-valsartan (ENTRESTO) 49-51 MG Take 1 tablet by mouth 2 (two) times daily. (Patient taking differently: Take 1 tablet by mouth in the morning and at bedtime.) 60 tablet 6   Tuberculin-Allergy Syringes (ULTICARE TUBERCULIN SAFETY SYR) 25G X 1" 1 ML MISC USE TO ADMINISTER INJECTABLE VITAMIN B12. 1 each 11   No current facility-administered medications for this visit.    Family History  Adopted: Yes  Problem Relation Age of Onset   Hypertension Father    Coronary artery disease Sister    Ulcers Maternal Grandmother    Colon cancer Neg Hx        unknown, was adopted    Social History   Socioeconomic History   Marital status: Married    Spouse name: Wade   Number of children: 1   Years of education: 12   Highest education level: Not on file  Occupational History   Occupation: disabled    Comment: heart  Tobacco Use   Smoking status: Light Smoker    Packs/day: 0.50    Years: 44.00    Additional pack years: 0.00    Total pack years: 22.00    Types: Cigarettes    Start date: 05/10/1975    Passive exposure: Never   Smokeless tobacco: Never   Tobacco comments:    2 cigarettes per day 12/10/2019  Vaping Use   Vaping Use: Former  Substance and Sexual Activity   Alcohol use: Not Currently    Alcohol/week: 2.0 standard drinks of alcohol    Types: 2 Glasses of wine per week   Drug use: No   Sexual activity: Yes    Birth control/protection: Surgical  Other Topics Concern   Not on file  Social History Narrative   Disabled     Lives with husband Wade   Two dogs   Social Determinants of Health    Financial Resource Strain: Not on file  Food Insecurity: No Food Insecurity (12/09/2022)   Hunger Vital Sign    Worried About Running Out of Food in the Last Year: Never true    Ran Out of Food in the Last Year: Never true  Transportation Needs: No Transportation Needs (12/09/2022)   PRAPARE - Transportation    Lack of Transportation (Medical): No    Lack of Transportation (Non-Medical): No  Physical Activity: Not on file  Stress: Not on file  Social Connections: Not on file  Intimate Partner Violence: Not At Risk (12/09/2022)   Humiliation, Afraid, Rape, and Kick questionnaire    Fear of Current or Ex-Partner: No    Emotionally Abused: No    Physically Abused: No    Sexually Abused: No     REVIEW OF SYSTEMS:   [X] denotes positive finding, [ ] denotes negative finding Cardiac  Comments:  Chest pain or chest pressure:    Shortness of breath upon exertion:    Short of breath when lying flat:    Irregular heart rhythm:        Vascular    Pain in calf, thigh, or hip brought on by ambulation:    Pain in feet at night that wakes you up from your sleep:     Blood clot in your veins:    Leg swelling:         Pulmonary    Oxygen at home:    Productive cough:     Wheezing:         Neurologic    Sudden weakness in arms or legs:     Sudden numbness in arms or legs:     Sudden onset of difficulty speaking or slurred speech:    Temporary loss of vision in one eye:     Problems with dizziness:         Gastrointestinal    Blood in stool:     Vomited blood:         Genitourinary    Burning when urinating:     Blood in urine:        Psychiatric    Major depression:         Hematologic    Bleeding problems:    Problems with blood clotting too easily:        Skin    Rashes or ulcers:        Constitutional    Fever or chills:      PHYSICAL EXAMINATION:  There were no vitals filed for this visit.  There is no height or weight on file to calculate BMI.   General:   WDWN in NAD; vital signs documented above Gait: Not observed HENT: WNL, normocephalic Pulmonary: normal non-labored breathing , without wheezing Abdomen: soft Skin: without rashes Vascular Exam/Pulses: Bilateral femoral pulses are 1+ bilaterally Bilateral radial pulses 2+ bilaterally Pedal pulses are not palpable Extremities:   Left foot  Left medial foot  Left plantar foot   Right foot   Musculoskeletal: no muscle wasting or atrophy  Neurologic: A&O X 3 Psychiatric:  The pt has Normal affect.   Non-Invasive Vascular Imaging:   ABI's/TBI's on 12/05/2022: Right:  0.22/amp - Great toe pressure: amp Left:  absent - Great toe pressure: absent   Previous ABI on 09/06/2021: Right:  0.90 Left:  0.97  Previous arterial duplex on 09/06/2021: Summary:  Right:   Patent stent with no evidence of stenosis is the right distal SFA and proximal popliteal artery     ASSESSMENT/PLAN:: 66 y.o. female here for follow up for PAD with hx of  hx of RLE arteriogram with right popliteal angioplasty and stenting and right AT angioplasty by Dr. Donis Pinder on 05/19/2021 for rest pain and gangrene.     On 06/01/2021, she underwent right 1st and 2nd toe amputation by Dr. Syon Tews.   He did have to explant a screw in the right great toe.    -pt ABI absent on the left and 0.22 on the right.  She has critical limb ischemia with wounds and rest pain.  She has palpable femoral pulses bilaterally.   -she will be scheduled for angiogram on Friday 12/08/2022 with Dr. Shaquille Murdy.  She requested pain medication, however on review of PDMP, she just received Tramadol 30 tablets on 2/28 and therefore I cannot prescribe her pain medication.  -discussed that she is at risk of amputation.  -discussed the importance of smoking cessation -continue asa/statin/plavix    Samantha Rhyne, PAC [facility] [phone]  Clinic MD:   Phala Schraeder 

## 2023-01-02 ENCOUNTER — Other Ambulatory Visit: Payer: Self-pay

## 2023-01-02 ENCOUNTER — Encounter: Payer: Self-pay | Admitting: Vascular Surgery

## 2023-01-02 ENCOUNTER — Other Ambulatory Visit: Payer: PPO

## 2023-01-02 ENCOUNTER — Ambulatory Visit (INDEPENDENT_AMBULATORY_CARE_PROVIDER_SITE_OTHER): Payer: PPO | Admitting: Vascular Surgery

## 2023-01-02 VITALS — BP 120/65 | HR 123 | Temp 98.2°F | Resp 20 | Ht 63.0 in | Wt 124.0 lb

## 2023-01-02 DIAGNOSIS — I70262 Atherosclerosis of native arteries of extremities with gangrene, left leg: Secondary | ICD-10-CM

## 2023-01-02 DIAGNOSIS — I739 Peripheral vascular disease, unspecified: Secondary | ICD-10-CM

## 2023-01-03 ENCOUNTER — Ambulatory Visit (HOSPITAL_COMMUNITY)
Admission: RE | Admit: 2023-01-03 | Discharge: 2023-01-03 | Disposition: A | Discharge status: Home / Self Care | Payer: PPO | Source: Ambulatory Visit | Attending: Vascular Surgery | Admitting: Vascular Surgery

## 2023-01-03 DIAGNOSIS — I739 Peripheral vascular disease, unspecified: Secondary | ICD-10-CM | POA: Diagnosis present

## 2023-01-03 MED ORDER — IOHEXOL 350 MG/ML SOLN
125.0000 mL | Freq: Once | INTRAVENOUS | Status: AC | PRN
  Administered 2023-01-03: 100 mL via INTRAVENOUS

## 2023-01-05 ENCOUNTER — Ambulatory Visit (INDEPENDENT_AMBULATORY_CARE_PROVIDER_SITE_OTHER): Payer: Self-pay | Admitting: Vascular Surgery

## 2023-01-05 DIAGNOSIS — I70262 Atherosclerosis of native arteries of extremities with gangrene, left leg: Secondary | ICD-10-CM

## 2023-01-05 MED ORDER — TRAMADOL HCL 50 MG PO TABS: 50.0000 mg | ORAL_TABLET | Freq: Four times a day (QID) | ORAL | 0 refills | Status: DC | PRN

## 2023-01-05 NOTE — Progress Notes (Signed)
CTA angiogram personally reviewed. There appears to be a focal critical stenosis as noted on ultrasound. I think there is a subtle, focal stenosis in the mid CFA on CT angiogram. Given her depressed ankle pressure and significant tissue loss, I think it best to proceed with L femoral endarterectomy.  Will plan to do L TMA at the same time. Discussed with patient and her husband. They are in agreement.  Rande Brunt. Lenell Antu, MD [facility] Phone Number: [phone] 01/05/2023 3:28 PM

## 2023-01-08 ENCOUNTER — Telehealth: Payer: Self-pay

## 2023-01-08 ENCOUNTER — Other Ambulatory Visit: Payer: Self-pay

## 2023-01-08 DIAGNOSIS — I70262 Atherosclerosis of native arteries of extremities with gangrene, left leg: Secondary | ICD-10-CM

## 2023-01-08 NOTE — Patient Outreach (Signed)
  Care Coordination   Initial Visit Note   01/08/2023 Name: Tammy Mcpherson MRN: [id number] DOB: 23-Feb-1956  Voicemail message received from spouse requesting return call. Call placed to spouse. He states they had checked patient's voicemail message and had a missed call and voicemail message from RN CM . Advised spouse that RN CM had tried to reach patient post discharge back in March to follow up to see how she was doing. Spouse voices patient has been busy with ' several things going on." She is having upcoming surgery this week and will be hospitalized for a few days probably. Advised spouse to expect a post discharge transition of care call. He voiced understanding.    Yes, provided Care Coordination Interventions:  Yes, provided   Follow up plan: No further intervention required.   Encounter Outcome:  Pt. Visit Completed   Alessandra Grout [facility] Care Management Care Management Community Coordinator Direct Phone: [phone] Toll Free: [phone] Fax: [phone]

## 2023-01-09 ENCOUNTER — Encounter: Payer: Self-pay | Admitting: Gastroenterology

## 2023-01-09 ENCOUNTER — Ambulatory Visit (INDEPENDENT_AMBULATORY_CARE_PROVIDER_SITE_OTHER): Payer: PPO | Admitting: Gastroenterology

## 2023-01-09 ENCOUNTER — Ambulatory Visit: Payer: PPO | Admitting: Physician Assistant

## 2023-01-09 VITALS — BP 106/69 | HR 118 | Temp 97.4°F | Ht 63.0 in

## 2023-01-09 DIAGNOSIS — R197 Diarrhea, unspecified: Secondary | ICD-10-CM

## 2023-01-09 DIAGNOSIS — K279 Peptic ulcer, site unspecified, unspecified as acute or chronic, without hemorrhage or perforation: Secondary | ICD-10-CM | POA: Diagnosis not present

## 2023-01-09 DIAGNOSIS — K31819 Angiodysplasia of stomach and duodenum without bleeding: Secondary | ICD-10-CM

## 2023-01-09 DIAGNOSIS — D509 Iron deficiency anemia, unspecified: Secondary | ICD-10-CM | POA: Diagnosis not present

## 2023-01-09 DIAGNOSIS — R634 Abnormal weight loss: Secondary | ICD-10-CM

## 2023-01-09 DIAGNOSIS — K219 Gastro-esophageal reflux disease without esophagitis: Secondary | ICD-10-CM

## 2023-01-09 MED ORDER — DICYCLOMINE HCL 10 MG PO CAPS: 10.0000 mg | ORAL_CAPSULE | Freq: Two times a day (BID) | ORAL | 1 refills | Status: DC | PRN

## 2023-01-09 MED ORDER — PANTOPRAZOLE SODIUM 40 MG PO TBEC
DELAYED_RELEASE_TABLET | ORAL | 3 refills | Status: DC
Start: 2023-01-09 — End: 2023-04-23

## 2023-01-09 NOTE — Patient Instructions (Addendum)
Please have your labs completed at [facility].  I will be in touch with results once I have received them and if any chance to review.  Per results we may also schedule you for a capsule endoscopy.  Continue taking iron once daily.  Continue pantoprazole 40 mg nightly.  You may use dicyclomine once daily as needed for diarrhea or abdominal pain.  If severe you may use up to twice daily.  Hold in the setting of constipation.  If no bowel movement 24 hours please do not take the medication.  Please start drinking at least 1 boost or Glucerna daily and follow a high-protein diet.  This is imperative in your healing as well as to help prevent any further weight loss.  We will follow-up in 3 months, sooner if needed.  It was a pleasure to see you today. I want to create trusting relationships with patients. If you receive a survey regarding your visit,  I greatly appreciate you taking time to fill this out on paper or through your MyChart. I value your feedback.  Brooke Bonito, MSN, FNP-BC, AGACNP-BC [facility]

## 2023-01-09 NOTE — Progress Notes (Signed)
GI Office Note    Referring Provider: Jarold Motto, PA Primary Care Physician:  Jarold Motto, PA Primary Gastroenterologist: Hennie Duos. Marletta Lor, DO  Date:  01/09/2023  ID:  Tammy Mcpherson, DOB 1956/03/08, MRN [id number]   Chief Complaint   Chief Complaint  Patient presents with   Anemia    Patient arrives today with husband Tammy Mcpherson. Follow up on hospitalization for anemia. Taking iron once daily. Having foot surgery on Thursday.    History of Present Illness  Tammy Mcpherson is a 67 y.o. female with a history of chronic diarrhea (IBS-D, bile salt, lactose intolerance, negative celiac), PUD in 2017 with gastric/duodenal AVMs s/p ablation in 2018, IDA followed by hematology, COPD, CAD, cardiomyopathy, diabetes, vitamin B12 deficiency, HLD, HTN presenting today for hospital follow-up.  Colonoscopy in 2017 with diverticulosis.  Repeat in 10 years.  Last seen in the office 09/13/2021. Attempted to try Xifaxan in the past however unable to afford due to cost.  Reported improvement in diarrhea at this time.  Usually occurring once per week.  Taking dicyclomine once weekly.  GERD controlled with Protonix once daily.  Denies any dysphagia.  Advised continue dicyclomine as needed, PPI daily.  Return in 1 year.  Plan for colonoscopy in 2027.  Seen at [facility] during hospitalization in January 2024 for acute symptomatic anemia and melena.  Presented with a hemoglobin of 5.3.  She received multiple units of PRBCs while inpatient.  She underwent small bowel endoscopy as noted below.  Small bowel endoscopy 10/26/2022 by Dr. Jena Gauss: -Normal esophagus small hiatal hernia -Abnormal gastric mucosa s/p biopsy -20 duodenal AVM, 2 actively oozing s/p APC therapy  Small bowel endoscopy 12/08/2022: -Normal esophagus -Gastric erosions with no stigmata of recent bleeding -normal stomach -10 AVMs in the distal duodenum/jejunum ablated with APC -Distal aspect of jejunum reached was tattooed in case capsule  endoscopy entertained -Advised to resume Plavix, received IV iron in hospital. -Consider octreotide injections as outpatient if recurrent severe anemia despite close monitoring and IV iron -Advised to consider capsule endoscopy for further assessment of small bowel if recurrent anemia  Labs 12/18/2022: Hemoglobin 9.9  Appears patient is having left common femoral endarterectomy done 01/11/2023.   Today: Reports gangrene in her left foot. No pain.  Reports she is due to have these amputated this Thursday.  Denies frequent NSAID use.  Has poor energy. Furthest she is able to walk is to the bedroom and the bathroom with assistance from her husband. Has poor appetite. She feels as though she has lost 8-10 pounds in the last month.No melena or brbpr.   GERD fairly well-controlled on pantoprazole.  Denies any dysphagia.  Still having issues with diarrhea. Having 2-3 loose stools daily. Sometimes has feeling like she has to go and is not able to. No abdominal pain.  Denies nausea or vomiting  Wt Readings from Last 3 Encounters:  01/02/23 124 lb (56.2 kg)  12/17/22 124 lb 9 oz (56.5 kg)  12/13/22 134 lb (60.8 kg)    Current Outpatient Medications  Medication Sig Dispense Refill   Acetaminophen 500 MG capsule Take 500-1,000 mg by mouth every 6 (six) hours as needed for pain (or headaches).     albuterol (PROVENTIL) (2.5 MG/3ML) 0.083% nebulizer solution Take 3 mLs (2.5 mg total) by nebulization every 4 (four) hours as needed for wheezing or shortness of breath. 75 mL 5   albuterol (VENTOLIN HFA) 108 (90 Base) MCG/ACT inhaler INHALE TWO PUFFS INTO THE LUNGS EVERY SIX HOURS AS  NEEDED FOR WHEEZING OR SHORTNESS OF BREATH 8.5 g 5   ALPRAZolam (XANAX) 0.5 MG tablet TAKE ONE TABLET (0.5MG  TOTAL) BY MOUTH DAILY AS NEEDED FOR ANXIETY 30 tablet 2   aspirin 81 MG chewable tablet Chew 1 tablet (81 mg total) by mouth every morning. 30 tablet 0   atorvastatin (LIPITOR) 80 MG tablet Take 1 tablet (80 mg  total) by mouth daily. (Patient taking differently: Take 80 mg by mouth at bedtime.) 90 tablet 3   blood glucose meter kit and supplies Dispense based on patient and insurance preference. Use up to four times daily as directed. (FOR ICD-10 E10.9, E11.9). 1 each 0   Cholecalciferol (VITAMIN D) 50 MCG (2000 UT) CAPS Take 1 capsule (2,000 Units total) by mouth daily. 90 capsule 4   Continuous Blood Gluc Receiver (FREESTYLE LIBRE 2 READER) DEVI Check blood glucose up to 4 times daily 1 each 0   Continuous Blood Gluc Sensor (FREESTYLE LIBRE 2 SENSOR) MISC Apply one sensor to the upper arm avery 14 days. (Patient taking differently: Inject 1 patch into the skin See admin instructions. Apply one sensor into the upper arm every 14 days) 6 each 1   cyanocobalamin (VITAMIN B12) 1000 MCG/ML injection INJECT 1 ML INTO THE MUSCLE EVERY 30 DAYS. (Patient taking differently: Inject 1,000 mcg into the skin every 30 (thirty) days.) 1 mL 11   cyclobenzaprine (FLEXERIL) 5 MG tablet Take 1 tablet (5 mg total) by mouth 3 (three) times daily as needed for muscle spasms. 45 tablet 3   dicyclomine (BENTYL) 10 MG capsule Take 1 capsule (10 mg total) by mouth 2 (two) times daily as needed for spasms. 45 capsule 1   FLUoxetine (PROZAC) 40 MG capsule Take 1 capsule (40 mg total) by mouth daily. 90 capsule 3   gabapentin (NEURONTIN) 300 MG capsule TAKE ONE CAPSULE (300 MG TOTAL) BY MOUTHTWO TIMES DAILY. (Patient taking differently: Take 300 mg by mouth 3 (three) times daily.) 180 capsule 1   glucose blood (ONETOUCH ULTRA) test strip Use as instructed 100 each 12   glucose monitoring kit (FREESTYLE) monitoring kit Use to check blood sugar BID as directed 1 each 0   iron polysaccharides (NIFEREX) 150 MG capsule Take 1 capsule (150 mg total) by mouth daily. 30 capsule 0   Lancets (ONETOUCH DELICA PLUS LANCET33G) MISC 1 each by Does not apply route 2 (two) times a day. 100 each 3   metFORMIN (GLUCOPHAGE-XR) 500 MG 24 hr tablet Take 1  tablet (500 mg total) by mouth daily with breakfast. (Patient taking differently: Take 500 mg by mouth daily as needed (high blood sugar).) 90 tablet 1   Misc. Devices MISC Please provide supplies (needle, syringe, alcohol swabs) needed for patient to self-administer B-12 injections monthly. 1 each 12   sacubitril-valsartan (ENTRESTO) 49-51 MG Take 1 tablet by mouth 2 (two) times daily. 60 tablet 6   traMADol (ULTRAM) 50 MG tablet Take 1 tablet (50 mg total) by mouth every 6 (six) hours as needed. 20 tablet 0   Tuberculin-Allergy Syringes (ULTICARE TUBERCULIN SAFETY SYR) 25G X 1" 1 ML MISC USE TO ADMINISTER INJECTABLE VITAMIN B12. 1 each 11   clopidogrel (PLAVIX) 75 MG tablet Take 1 tablet (75 mg total) by mouth daily. TAKE ONE TABLET (75MG  TOTAL) BY MOUTH DAILY WITH BREAKFAST (Patient not taking: Reported on 01/09/2023) 90 tablet 1   pantoprazole (PROTONIX) 40 MG tablet TAKE ONE (1) TABLET 30 MINS BEFORE YOUR FIRST MEAL. 90 tablet 3   No current  facility-administered medications for this visit.    Past Medical History:  Diagnosis Date   Anemia 04/30/2016   Anxiety    Arthritis    Cardiomyopathy    a. EF 40-45% by echo in 04/2016 b. Improved to 60-65% by repeat imaging in 2018   COPD (chronic obstructive pulmonary disease)    Coronary artery calcification seen on CT scan    Critical limb ischemia of both lower extremities    Essential hypertension    GERD (gastroesophageal reflux disease)    Headache    History of bronchitis    History of GI bleed    History of hiatal hernia    Hyperlipidemia    Iron deficiency anemia    Peripheral vascular disease    Pollen allergy    PSVT (paroxysmal supraventricular tachycardia)    PVC's (premature ventricular contractions)    Type 2 diabetes mellitus    Vitamin B12 deficiency     Past Surgical History:  Procedure Laterality Date   ABDOMINAL AORTOGRAM W/LOWER EXTREMITY Bilateral 05/19/2021   Procedure: ABDOMINAL AORTOGRAM W/LOWER EXTREMITY;   Surgeon: [name], MD;  Location: [facility] INVASIVE CV LAB;  Service: Cardiovascular;  Laterality: Bilateral;   ABDOMINAL AORTOGRAM W/LOWER EXTREMITY N/A 12/15/2022   Procedure: ABDOMINAL AORTOGRAM W/LOWER EXTREMITY;  Surgeon: [name], MD;  Location: [facility] INVASIVE CV LAB;  Service: Cardiovascular;  Laterality: N/A;   ABDOMINAL AORTOGRAM W/LOWER EXTREMITY N/A 12/18/2022   Procedure: ABDOMINAL AORTOGRAM W/LOWER EXTREMITY;  Surgeon: Chuck Hint, MD;  Location: [facility] INVASIVE CV LAB;  Service: Cardiovascular;  Laterality: N/A;   ABDOMINAL HYSTERECTOMY     polyps  about age 89   AIKEN OSTEOTOMY Right 07/10/2013   Procedure: Quintella Reichert OSTEOTOMY RIGHT FOOT;  Surgeon: Dallas Schimke, DPM;  Location: [facility] ORS;  Service: Orthopedics;  Laterality: Right;   AMPUTATION Left 05/09/2017   Procedure: AMPUTATION 5TH TOE LEFT FOOT;  Surgeon: Ferman Hamming, DPM;  Location: [facility] ORS;  Service: Podiatry;  Laterality: Left;   AMPUTATION Left 06/13/2017   Procedure: AMPUTATION 2ND TOE LEFT FOOT;  Surgeon: Ferman Hamming, DPM;  Location: [facility] OR[facility];  Service: Podiatry;  Laterality: Left;   AMPUTATION Right 06/01/2021   Procedure: Right great toe and Second Toe Amputation;  Surgeon: [name], MD;  Location: [facility] OR;  Service: Vascular;  Laterality: Right;   BIOPSY  10/27/2022   Procedure: BIOPSY;  Surgeon: Corbin Ade, MD;  Location: [facility]O SU[facility];  Service: Endoscopy;;   BUNIONECTOMY Right 07/10/2013   Procedure: Ernesta Amble RIGHT FOOT;  Surgeon: Dallas Schimke, DPM;  Location: [facility] OR[facility];  Service: Orthopedics;  Laterality: Right;   CHOLECYSTECTOMY N/A 08/22/2017   Procedure: LAPAROSCOPIC CHOLECYSTECTOMY;  Surgeon: Lucretia Roers, MD;  Location: [facility] OR[facility];  Service: General;  Laterality: N/A;   COLONOSCOPY N/A 07/07/2016   Dr. Darrick Penna; redundant left colon, diverticulosis at hepatic flexure, non-bleeding internal hemorrhoids   ENTEROSCOPY N/A 10/27/2022   Procedure: ENTEROSCOPY;   Surgeon: Corbin Ade, MD;  Location: [facility];  Service: Endoscopy;  Laterality: N/A;   ENTEROSCOPY N/A 12/09/2022   Procedure: ENTEROSCOPY;  Surgeon: Benancio Deeds, MD;  Location: [facility] ENDOSCOPY;  Service: Gastroenterology;  Laterality: N/A;   ESOPHAGOGASTRODUODENOSCOPY N/A 07/07/2016   Dr. Darrick Penna: many non-bleeding cratered gastric ulcers without stigmata of bleeding in gastric antrum. four 2-3 mm angioectasias without bleeding in duodenal bulb and second portion of duodenum s/p APC. Chroni gastritis on path.    ESOPHAGOGASTRODUODENOSCOPY N/A 08/03/2017   Dr. Darrick Penna: erosive gastritis,  AVMs. Found a single non-bleeding angioectasia in stomach, s/p APC therapy. Four non-bleeding angioectasias in duodenum s/p APC. Non-bleeding erosive gastropathy   HOT HEMOSTASIS  10/27/2022   Procedure: HOT HEMOSTASIS (ARGON PLASMA COAGULATION/BICAP);  Surgeon: Corbin Adeourk, Robert M, MD;  Location: [facility];  Service: Endoscopy;;   HOT HEMOSTASIS N/A 12/09/2022   Procedure: HOT HEMOSTASIS (ARGON PLASMA COAGULATION/BICAP);  Surgeon: Benancio DeedsArmbruster, Steven P, MD;  [facility];  Service: Gastroenterology;  Laterality: N/A;   IR ANGIOGRAM EXTREMITY LEFT  04/24/2017   IR FEM POP ART ATHERECT INC PTA MOD SED  04/24/2017   IR INFUSION THROMBOL ARTERIAL INITIAL (MS)  04/24/2017   IR RADIOLOGIST EVAL & MGMT  12/05/2016   IR US GUIDE VASC ACCESS RIGHT  04/24/2017   LIVER BIOPSY N/A 08/22/2017   Procedure: LIVER BIOPSY;  Surgeon: Lucretia RoersBridges, Lindsay C, MD;  Location: [facility];  Service: General;  Laterality: N/A;   METATARSAL HEAD EXCISION Right 07/10/2013   Procedure: METATARSAL HEAD RESECTION OF DIGITS 2 AND 3 RIGHT FOOT;  Surgeon: Dallas SchimkeBenjamin Ivan McKinney, DPM;  Location: [facility];  Service: Orthopedics;  Laterality: Right;   PERIPHERAL VASCULAR INTERVENTION Right 05/19/2021   Procedure: PERIPHERAL VASCULAR INTERVENTION;  Surgeon: Leonie DouglasHawken, Thomas N, MD;  Location: [facility];  Service: Cardiovascular;  Laterality:  Right;   PERIPHERAL VASCULAR INTERVENTION Left 12/15/2022   Procedure: PERIPHERAL VASCULAR INTERVENTION;  Surgeon: Leonie DouglasHawken, Thomas N, MD;  Location: [facility];  Service: Cardiovascular;  Laterality: Left;  SFA   PERIPHERAL VASCULAR INTERVENTION Right 12/18/2022   Procedure: PERIPHERAL VASCULAR INTERVENTION;  Surgeon: Chuck Hintickson, Christopher S, MD;  [facility];  Service: Cardiovascular;  Laterality: Right;   PROXIMAL INTERPHALANGEAL FUSION (PIP) Right 07/10/2013   Procedure: ARTHRODESIS PIPJ  2ND DIGIT RIGHT FOOT;  Surgeon: Dallas SchimkeBenjamin Ivan McKinney, DPM;  Location: [facility];  Service: Orthopedics;  Laterality: Right;   SUBMUCOSAL TATTOO INJECTION  12/09/2022   Procedure: SUBMUCOSAL TATTOO INJECTION;  Surgeon: Benancio DeedsArmbruster, Steven P, MD;  [facility];  Service: Gastroenterology;;    Family History  Adopted: Yes  Problem Relation Age of Onset   Hypertension Father    Coronary artery disease Sister    Ulcers Maternal Grandmother    Colon cancer Neg Hx        unknown, was adopted    Allergies as of 01/09/2023 - Review Complete 01/09/2023  Allergen Reaction Noted   Bee venom Anaphylaxis and Swelling 05/19/2016   Codeine Anaphylaxis and Other (See Comments) 04/30/2016   Penicillins Anaphylaxis, Swelling, and Rash 03/07/2012   Bupropion Other (See Comments) 10/04/2017   Sudafed [pseudoephedrine hcl] Rash 06/27/2013    Social History   Socioeconomic History   Marital status: Married    Spouse name: Tammy ButtsWade   Number of children: 1   Years of education: 12   Highest education level: Not on file  Occupational History   Occupation: disabled    Comment: heart  Tobacco Use   Smoking status: Light Smoker    Packs/day: 0.50    Years: 44.00    Additional pack years: 0.00    Total pack years: 22.00    Types: Cigarettes    Start date: 05/10/1975    Passive exposure: Never   Smokeless tobacco: Never   Tobacco comments:    2 cigarettes per day 12/10/2019  Vaping Use    Vaping Use: Former  Substance and Sexual Activity   Alcohol use: Not Currently    Alcohol/week: 2.0 standard drinks of alcohol  Types: 2 Glasses of wine per week   Drug use: No   Sexual activity: Yes    Birth control/protection: Surgical  Other Topics Concern   Not on file  Social History Narrative   Disabled   Lives with husband Tammy Mcpherson   Two dogs   Social Determinants of Health   Financial Resource Strain: Not on file  Food Insecurity: No Food Insecurity (12/09/2022)   Hunger Vital Sign    Worried About Running Out of Food in the Last Year: Never true    Ran Out of Food in the Last Year: Never true  Transportation Needs: No Transportation Needs (12/09/2022)   PRAPARE - Administrator, Civil Service (Medical): No    Lack of Transportation (Non-Medical): No  Physical Activity: Not on file  Stress: Not on file  Social Connections: Not on file     Review of Systems   Gen: Denies fever, chills, anorexia. Denies fatigue, weakness, weight loss.  CV: Denies chest pain, palpitations, syncope, peripheral edema, and claudication. Resp: Denies dyspnea at rest, cough, wheezing, coughing up blood, and pleurisy. GI: See HPI Derm: Denies rash, itching, dry skin Psych: Denies depression, anxiety, memory loss, confusion. No homicidal or suicidal ideation.  Heme: Denies bruising, bleeding, and enlarged lymph nodes.   Physical Exam   BP 106/69 (BP Location: Right Arm, Patient Position: Sitting, Cuff Size: Normal)   Pulse (!) 118   Temp (!) 97.4 F (36.3 C) (Oral)   Ht 5\' 3"  (1.6 m)   BMI 21.97 kg/m   General:   Alert and oriented.  Thin, frail appearing. Pleasant and cooperative.  Head:  Normocephalic and atraumatic. Eyes:  Conjuctiva clear without scleral icterus. Mouth:  Oral mucosa pink and moist. Good dentition. No lesions. Lungs:  Clear to auscultation bilaterally. No wheezes, rales, or rhonchi. No distress.  Heart:  S1, S2 present without murmurs appreciated.   Abdomen:  +BS, soft, non-tender and non-distended. No rebound or guarding. No HSM or masses noted.  Exam limited given patient wheelchair. Rectal: deferred Msk:  Symmetrical without gross deformities. Normal posture. Extremities:  dry extremities. Evidence of gangrene and brown leathery skin to 3 remaining toes on the left foot..  Amputations of the other 2 digits. Neurologic:  Alert and  oriented x4 Psych:  Alert and cooperative. Normal mood and affect.   Assessment  BETTYLOU WINTERROWD is a 67 y.o. female with a history of chronic diarrhea (IBS-D, bile salt, lactose intolerance), PUD in 2017 with gastric/duodenal AVMs s/p ablation in 2018, IDA followed by hematology, COPD, CAD, cardiomyopathy, diabetes, vitamin B12 deficiency, HLD, HTN presenting today for hospital follow-up.  Iron deficiency anemia, small bowel AVMs: History of gastric and duodenal AVMs in 2018.  Recent hospitalization at [facility] for severe acute symptomatic anemia with small bowel endoscopy performed revealing 20 duodenal AVMs with 2 actively oozing and treated with APC therapy.  She was recently hospitalized at [facility] in the beginning of March and again presented with symptomatic anemia with a hemoglobin of 4.4.  She underwent repeat small bowel endoscopy revealing 10 AVMs in the distal duodenum/jejunum that were also ablated with APC.  Tattooing was performed in the distal aspect of the jejunum.  She was recommended to consider octreotide or small bowel capsule to further evaluate for additional lesions within the distal small bowel.  She received IV iron during her most recent hospitalization.  She usually follows regularly with hematology.  Most recent hemoglobin 12/18/2022 was 9.9.  Continues to have  frequent monitoring of her hemoglobin.  Will plan to recheck today or within the next few days as well as an iron panel.  She is currently taking oral iron once daily.  We discussed the possibility of octreotide today but will monitor  hemoglobins for a little longer prior to initiating therapy.  Currently not having any melena or BRBPR.  Will schedule givens capsule if any worsening anemia noted on labs.  GERD, PUD, concern for weight loss: GERD symptoms fairly well-controlled on pantoprazole 40 mg nightly.  Refilled today.  Denies any nausea, vomiting, or dysphagia.  Does have a lack of appetite however suspect this is possibly multifactorial in the setting of ongoing infections and issues with peripheral blood flow.  I have encouraged her to follow a high-protein diet and drink at least 1 boost or Glucerna daily to help maintain weight.  Diarrhea: Previous episodes of diarrhea suspected to be secondary to metformin.  Previously used dicyclomine as needed and was having good results.  Currently having 2-3 loose stools per day associated with some urgency at times.  No incontinence.  No melena or BRBPR. Will use dicyclomine 10 mg once to twice daily as needed for diarrhea.  PLAN   CBC, iron panel If worsening anemia will schedule for givens capsule and may consider starting octreotide. Continue oral iron once daily Continue pantoprazole 40 mg nightly. Refilled.  Use dicyclomine 10 mg as needed.  Take boost or Glucerna once daily.  High protein diet.  Continue to follow with hematology Follow-up in 3 months   Brooke Bonito, MSN, FNP-BC, AGACNP-BC [facility]

## 2023-01-10 ENCOUNTER — Other Ambulatory Visit: Payer: Self-pay

## 2023-01-10 ENCOUNTER — Encounter (HOSPITAL_COMMUNITY): Payer: Self-pay | Admitting: Vascular Surgery

## 2023-01-10 NOTE — Anesthesia Preprocedure Evaluation (Signed)
Anesthesia Evaluation  Patient identified by MRN, date of birth, ID band Patient awake    Reviewed: Allergy & Precautions, NPO status , Patient's Chart, lab work & pertinent test results  Airway Mallampati: II  TM Distance: >3 FB Neck ROM: Full    Dental  (+) Edentulous Upper, Edentulous Lower   Pulmonary COPD, Current Smoker and Patient abstained from smoking.   Pulmonary exam normal        Cardiovascular hypertension, Pt. on medications + CAD, + Peripheral Vascular Disease and +CHF   Rhythm:Regular Rate:Normal  Echo 06/2022  1. Left ventricular ejection fraction, by estimation, is 60 to 65%. The left ventricle has normal function. The left ventricle has no regional wall motion abnormalities. There is mild left ventricular hypertrophy. Left ventricular diastolic parameters are consistent with Grade I diastolic dysfunction (impaired relaxation). The average left ventricular global longitudinal strain is -18.7 %. The global longitudinal strain is normal.   2. Right ventricular systolic function is normal. The right ventricular size is normal. Tricuspid regurgitation signal is inadequate for assessing PA pressure.   3. Left atrial size was mildly dilated.   4. The mitral valve is grossly normal. Mild mitral valve regurgitation.   5. The aortic valve is tricuspid. There is mild calcification of the aortic valve. Aortic valve regurgitation is not visualized. Aortic valve sclerosis is present, with no evidence of aortic valve stenosis.   6. The inferior vena cava is normal in size with greater than 50% respiratory variability, suggesting right atrial pressure of 3 mmHg.   Comparison(s): No significant change from prior study. Prior images reviewed side by side.     Neuro/Psych  Headaches PSYCHIATRIC DISORDERS Anxiety        GI/Hepatic hiatal hernia, PUD,GERD  ,,  Endo/Other  diabetes, Type 2    Renal/GU   negative genitourinary    Musculoskeletal  (+) Arthritis , Osteoarthritis,    Abdominal Normal abdominal exam  (+)   Peds  Hematology  (+) Blood dyscrasia, anemia   Anesthesia Other Findings   Reproductive/Obstetrics                             Anesthesia Physical Anesthesia Plan  ASA: 3  Anesthesia Plan: General   Post-op Pain Management: Regional block*, Tylenol PO (pre-op)* and Gabapentin PO (pre-op)*   Induction: Intravenous  PONV Risk Score and Plan: 3 and Treatment may vary due to age or medical condition, Ondansetron and Dexamethasone  Airway Management Planned: Oral ETT  Additional Equipment: Arterial line  Intra-op Plan:   Post-operative Plan: Extubation in OR  Informed Consent: I have reviewed the patients History and Physical, chart, labs and discussed the procedure including the risks, benefits and alternatives for the proposed anesthesia with the patient or authorized representative who has indicated his/her understanding and acceptance.     Dental advisory given  Plan Discussed with: CRNA  Anesthesia Plan Comments: (See APP note by Joslyn Hy, FNP )       Anesthesia Quick Evaluation

## 2023-01-10 NOTE — Progress Notes (Addendum)
SDW call  Patient was given pre-op instructions over the phone. Patient verbalized understanding of instructions provided.     PCP - Jarold Motto Cardiologist - Nona Dell  PPM/ICD - Denies   Chest x-ray - 10/29/2022 EKG -  12/08/2022 Stress Test - 05/09/2016 ECHO - 06/09/2022 Cardiac Cath -   Sleep Study/sleep apnea/CPAP: Denies  Type II diabetic. Has [employer], but removed it today for surgery Fasting Blood sugar range 110-120 How often check sugars Continuous   Blood Thinner Instructions: Hold Plavix for 5 days Aspirin Instructions:Continue ASA   ERAS Protcol - No, NPO PRE-SURGERY Ensure or G2- no   COVID TEST- n/a     Anesthesia review: Yes. Extensive cardiac hx    Patient denies shortness of breath, fever, cough and chest pain over the phone call    Your procedure is scheduled on Thursday January 11, 2023  Report to [facility] Main Entrance "A" at  0930  A.M., then check in with the Admitting office.  Call this number if you have problems the morning of surgery:  [phone]   If you have any questions prior to your surgery date call [phone]: Open Monday-Friday 8am-4pm If you experience any cold or flu symptoms such as cough, fever, chills, shortness of breath, etc. between now and your scheduled surgery, please notify us at the above number     Remember:  Do not eat or drink after midnight the night before your surgery   Take these medicines the morning of surgery with A SIP OF WATER:  ASA, prozac, gabapentin.  As needed: Tylenol, albuterol neb and inhaler, xanax, flexeril, tramadol  Hold Metformin day of surgery  As of today, STOP taking any Aleve, Naproxen, Ibuprofen, Motrin, Advil, Goody's, BC's, all herbal medications, fish oil, and all vitamins.

## 2023-01-10 NOTE — Progress Notes (Signed)
Anesthesia Chart Review:  Pt is a same day work up    Case: [id number] Date/Time: 01/11/23 1145   Procedures:      LEFT COMMON FEMORAL ENDARTERECTOMY (Left)     TRANSMETATARSAL AMPUTATION (Left: Toe)   Anesthesia type: General   Pre-op diagnosis: Atherosclerosis of native artery of left lower extremity with gangrene   [facility] / [facility]   Surgeons: Leonie DouglasHawken, Thomas N, MD       DISCUSSION: Pt is 67 years old with hx nonischemic cardiomyopathy (in 2017, EF now recovered to normal), coronary calcifications on CT, PSVT, HTN, DM, IDA, hx GI bleeding/profound anemia, COPD.   Current smoker.   Hospitalized 3/13-3/19/24 for critical limb ischemia of LLE. S/p stent to L femoral-popliteal artery 3/15 and s/p R angioplasty/stenting R SFA and drug coated balloon angioplasty of in-stent stenosis of prior R femoral stent.   Hospitalized 3/8-3/10/24 for anemia - hgb 5.8 - found on pre-op labs for LE angiogram. Hgb found to be 4.4, underwent transfusion of multiple units blood. Endoscopy showed 10 AVMs in small intestine that were ablated.    PROVIDERS: - PCP is Jarold MottoWorley, Samantha, [place] - Cardiologist is Nona DellSamuel McDowell, MD. Last office visit 06/07/22  LABS: Will be obtained day of surgery  BMP 12/19/22 with NA 131, Cl 97, Ca 8.7 CBC 12/18/22 with Hgb 9.9  IMAGES: CT chest lung cancer screening 02/10/22:  - Lung-RADS 2, benign appearance or behavior. Continue annual screening with low-dose chest CT without contrast in 12 months. - Aortic Atherosclerosis and Emphysema   EKG 12/08/22: Sinus rhythm. Borderline ST depression, diffuse leads   CV: Echo 06/09/22:  1. Left ventricular ejection fraction, by estimation, is 60 to 65%. The left ventricle has normal function. The left ventricle has no regional wall motion abnormalities. There is mild left ventricular hypertrophy. Left ventricular diastolic parameters are consistent with Grade I diastolic dysfunction (impaired relaxation). The average  left ventricular global longitudinal strain is -18.7 %. The global longitudinal strain is normal.  2. Right ventricular systolic function is normal. The right ventricular size is normal. Tricuspid regurgitation signal is inadequate for assessing PA pressure.  3. Left atrial size was mildly dilated.  4. The mitral valve is grossly normal. Mild mitral valve regurgitation.  5. The aortic valve is tricuspid. There is mild calcification of the aortic valve. Aortic valve regurgitation is not visualized. Aortic valve sclerosis is present, with no evidence of aortic valve stenosis.  6. The inferior vena cava is normal in size with greater than 50% respiratory variability, suggesting right atrial pressure of 3 mmHg.  - Comparison(s): No significant change from prior study. Prior images reviewed side by side.   Nuclear stress test 05/19/16:  Upsloping ST segment depression ST segment depression was noted during stress in the II, III, aVF, V4, V5 and V6 leads. Overall nonspecific EKG changes. The left ventricular ejection fraction is severely decreased (<30%). The study is normal. There are no perfusion defects consistent with prior infarct or current ischemia This is a high risk study. High risk based on decreased LVEF. There is no myocardium currently at jeopardy.   Past Medical History:  Diagnosis Date   Anemia 04/30/2016   Anxiety    Arthritis    Cardiomyopathy    a. EF 40-45% by echo in 04/2016 b. Improved to 60-65% by repeat imaging in 2018   COPD (chronic obstructive pulmonary disease)    Coronary artery calcification seen on CT scan    Critical limb ischemia  of both lower extremities    Essential hypertension    GERD (gastroesophageal reflux disease)    Headache    History of bronchitis    History of GI bleed    History of hiatal hernia    Hyperlipidemia    Iron deficiency anemia    Peripheral vascular disease    Pollen allergy    PSVT (paroxysmal supraventricular tachycardia)     PVC's (premature ventricular contractions)    Type 2 diabetes mellitus    Vitamin B12 deficiency     Past Surgical History:  Procedure Laterality Date   ABDOMINAL AORTOGRAM W/LOWER EXTREMITY Bilateral 05/19/2021   Procedure: ABDOMINAL AORTOGRAM W/LOWER EXTREMITY;  Surgeon: [name], MD;  [facility];  Service: Cardiovascular;  Laterality: Bilateral;   ABDOMINAL AORTOGRAM W/LOWER EXTREMITY N/A 12/15/2022   Procedure: ABDOMINAL AORTOGRAM W/LOWER EXTREMITY;  Surgeon: [name], MD;  [facility];  Service: Cardiovascular;  Laterality: N/A;   ABDOMINAL AORTOGRAM W/LOWER EXTREMITY N/A 12/18/2022   Procedure: ABDOMINAL AORTOGRAM W/LOWER EXTREMITY;  Surgeon: Chuck Hint, MD;  Location: [facility] INVASIVE CV LAB;  Service: Cardiovascular;  Laterality: N/A;   ABDOMINAL HYSTERECTOMY     polyps  about age 37   AIKEN OSTEOTOMY Right 07/10/2013   Procedure: Quintella Reichert OSTEOTOMY RIGHT FOOT;  Surgeon: Dallas Schimke, DPM;  Location: [facility] ORS;  Service: Orthopedics;  Laterality: Right;   AMPUTATION Left 05/09/2017   Procedure: AMPUTATION 5TH TOE LEFT FOOT;  Surgeon: Ferman Hamming, DPM;  Location: [facility];  Service: Podiatry;  Laterality: Left;   AMPUTATION Left 06/13/2017   Procedure: AMPUTATION 2ND TOE LEFT FOOT;  Surgeon: Ferman Hamming, DPM;  Location: [facility]S;  Service: Podiatry;  Laterality: Left;   AMPUTATION Right 06/01/2021   Procedure: Right great toe and Second Toe Amputation;  Surgeon: [name], MD;  Location: [facility] OR;  Service: Vascular;  Laterality: Right;   BIOPSY  10/27/2022   Procedure: BIOPSY;  Surgeon: Corbin Ade, MD;  Location: [facility] ENDO SU[facility];  Service: Endoscopy;;   BUNIONECTOMY Right 07/10/2013   Procedure: Ernesta Amble RIGHT FOOT;  Surgeon: Dallas Schimke, DPM;  Location: [facility]S;  Service: Orthopedics;  Laterality: Right;   CHOLECYSTECTOMY N/A 08/22/2017   Procedure: LAPAROSCOPIC CHOLECYSTECTOMY;  Surgeon:  Lucretia Roers, MD;  Location: [facility] ORS;  Service: General;  Laterality: N/A;   COLONOSCOPY N/A 07/07/2016   Dr. Darrick Penna; redundant left colon, diverticulosis at hepatic flexure, non-bleeding internal hemorrhoids   ENTEROSCOPY N/A 10/27/2022   Procedure: ENTEROSCOPY;  Surgeon: Corbin Ade, MD;  [facility];  Service: Endoscopy;  Laterality: N/A;   ENTEROSCOPY N/A 12/09/2022   Procedure: ENTEROSCOPY;  Surgeon: Benancio Deeds, MD;  Location: [facility] ENDOSCOPY;  Service: Gastroenterology;  Laterality: N/A;   ESOPHAGOGASTRODUODENOSCOPY N/A 07/07/2016   Dr. Darrick Penna: many non-bleeding cratered gastric ulcers without stigmata of bleeding in gastric antrum. four 2-3 mm angioectasias without bleeding in duodenal bulb and second portion of duodenum s/p APC. Chroni gastritis on path.    ESOPHAGOGASTRODUODENOSCOPY N/A 08/03/2017   Dr. Darrick Penna: erosive gastritis, AVMs. Found a single non-bleeding angioectasia in stomach, s/p APC therapy. Four non-bleeding angioectasias in duodenum s/p APC. Non-bleeding erosive gastropathy   HOT HEMOSTASIS  10/27/2022   Procedure: HOT HEMOSTASIS (ARGON PLASMA COAGULATION/BICAP);  Surgeon: Corbin Ade, MD;  [facility];  Service: Endoscopy;;   HOT HEMOSTASIS N/A 12/09/2022   Procedure: HOT HEMOSTASIS (ARGON PLASMA COAGULATION/BICAP);  Surgeon: Benancio Deeds, MD;  Location[facility]  [facility];  Service: Gastroenterology;  Laterality: N/A;   IR ANGIOGRAM EXTREMITY LEFT  04/24/2017   IR FEM POP ART ATHERECT INC PTA MOD SED  04/24/2017   IR INFUSION THROMBOL ARTERIAL INITIAL (MS)  04/24/2017   IR RADIOLOGIST EVAL & MGMT  12/05/2016   IR US GUIDE VASC ACCESS RIGHT  04/24/2017   LIVER BIOPSY N/A 08/22/2017   Procedure: LIVER BIOPSY;  Surgeon: Lucretia Roers, MD;  Location: [facility];  Service: General;  Laterality: N/A;   METATARSAL HEAD EXCISION Right 07/10/2013   Procedure: METATARSAL HEAD RESECTION OF DIGITS 2 AND 3 RIGHT FOOT;  Surgeon: Dallas Schimke, DPM;  Location: [facility];  Service: Orthopedics;  Laterality: Right;   PERIPHERAL VASCULAR INTERVENTION Right 05/19/2021   Procedure: PERIPHERAL VASCULAR INTERVENTION;  Surgeon: [name], MD;  [facility];  Service: Cardiovascular;  Laterality: Right;   PERIPHERAL VASCULAR INTERVENTION Left 12/15/2022   Procedure: PERIPHERAL VASCULAR INTERVENTION;  Surgeon: [name], MD;  [facility];  Service: Cardiovascular;  Laterality: Left;  SFA   PERIPHERAL VASCULAR INTERVENTION Right 12/18/2022   Procedure: PERIPHERAL VASCULAR INTERVENTION;  Surgeon: Chuck Hint, MD;  Location: [facility] INVASIVE CV LAB;  Service: Cardiovascular;  Laterality: Right;   PROXIMAL INTERPHALANGEAL FUSION (PIP) Right 07/10/2013   Procedure: ARTHRODESIS PIPJ  2ND DIGIT RIGHT FOOT;  Surgeon: Dallas Schimke, DPM;  Location: [facility];  Service: Orthopedics;  Laterality: Right;   SUBMUCOSAL TATTOO INJECTION  12/09/2022   Procedure: SUBMUCOSAL TATTOO INJECTION;  Surgeon: Benancio Deeds, MD;  Location: [facility] ENDOSCOPY;  Service: Gastroenterology;;    MEDICATIONS: No current facility-administered medications for this encounter.    Acetaminophen 500 MG capsule   albuterol (PROVENTIL) (2.5 MG/3ML) 0.083% nebulizer solution   albuterol (VENTOLIN HFA) 108 (90 Base) MCG/ACT inhaler   ALPRAZolam (XANAX) 0.5 MG tablet   aspirin 81 MG chewable tablet   atorvastatin (LIPITOR) 80 MG tablet   Cholecalciferol (VITAMIN D) 50 MCG (2000 UT) CAPS   clopidogrel (PLAVIX) 75 MG tablet   cyanocobalamin (VITAMIN B12) 1000 MCG/ML injection   cyclobenzaprine (FLEXERIL) 5 MG tablet   FLUoxetine (PROZAC) 40 MG capsule   gabapentin (NEURONTIN) 300 MG capsule   iron polysaccharides (NIFEREX) 150 MG capsule   metFORMIN (GLUCOPHAGE-XR) 500 MG 24 hr tablet   sacubitril-valsartan (ENTRESTO) 49-51 MG   traMADol (ULTRAM) 50 MG tablet   blood glucose meter kit and supplies   Continuous Blood  Gluc Receiver (FREESTYLE LIBRE 2 READER) DEVI   Continuous Blood Gluc Sensor (FREESTYLE LIBRE 2 SENSOR) MISC   dicyclomine (BENTYL) 10 MG capsule   glucose blood (ONETOUCH ULTRA) test strip   glucose monitoring kit (FREESTYLE) monitoring kit   Lancets (ONETOUCH DELICA PLUS LANCET33G) MISC   Misc. Devices MISC   pantoprazole (PROTONIX) 40 MG tablet   Tuberculin-Allergy Syringes (ULTICARE TUBERCULIN SAFETY SYR) 25G X 1" 1 ML MISC    If labs acceptable day of surgery, I anticipate pt can proceed with surgery as scheduled.  Rica Mast, PhD, FNP-BC [facility]/Anesthesiology Phone: [phone] 01/10/2023 10:45 AM

## 2023-01-11 ENCOUNTER — Encounter (HOSPITAL_COMMUNITY)
Admission: RE | Disposition: A | Discharge status: Home / Self Care | Payer: Self-pay | Source: Home / Self Care | Attending: Vascular Surgery

## 2023-01-11 ENCOUNTER — Other Ambulatory Visit: Payer: Self-pay

## 2023-01-11 ENCOUNTER — Inpatient Hospital Stay (HOSPITAL_COMMUNITY): Payer: PPO | Admitting: Emergency Medicine

## 2023-01-11 ENCOUNTER — Encounter (HOSPITAL_COMMUNITY): Payer: Self-pay | Admitting: Vascular Surgery

## 2023-01-11 ENCOUNTER — Inpatient Hospital Stay (HOSPITAL_COMMUNITY)
Admission: RE | Admit: 2023-01-11 | Discharge: 2023-01-12 | DRG: 240 | Disposition: A | Discharge status: Home / Self Care | Payer: PPO | Attending: Vascular Surgery | Admitting: Vascular Surgery

## 2023-01-11 DIAGNOSIS — E785 Hyperlipidemia, unspecified: Secondary | ICD-10-CM | POA: Diagnosis present

## 2023-01-11 DIAGNOSIS — I428 Other cardiomyopathies: Secondary | ICD-10-CM | POA: Diagnosis present

## 2023-01-11 DIAGNOSIS — K219 Gastro-esophageal reflux disease without esophagitis: Secondary | ICD-10-CM | POA: Diagnosis present

## 2023-01-11 DIAGNOSIS — Z7982 Long term (current) use of aspirin: Secondary | ICD-10-CM

## 2023-01-11 DIAGNOSIS — F17210 Nicotine dependence, cigarettes, uncomplicated: Secondary | ICD-10-CM

## 2023-01-11 DIAGNOSIS — Z8249 Family history of ischemic heart disease and other diseases of the circulatory system: Secondary | ICD-10-CM | POA: Diagnosis not present

## 2023-01-11 DIAGNOSIS — I70262 Atherosclerosis of native arteries of extremities with gangrene, left leg: Secondary | ICD-10-CM | POA: Diagnosis present

## 2023-01-11 DIAGNOSIS — I96 Gangrene, not elsewhere classified: Secondary | ICD-10-CM

## 2023-01-11 DIAGNOSIS — Z880 Allergy status to penicillin: Secondary | ICD-10-CM

## 2023-01-11 DIAGNOSIS — Z79899 Other long term (current) drug therapy: Secondary | ICD-10-CM

## 2023-01-11 DIAGNOSIS — I509 Heart failure, unspecified: Secondary | ICD-10-CM

## 2023-01-11 DIAGNOSIS — K449 Diaphragmatic hernia without obstruction or gangrene: Secondary | ICD-10-CM | POA: Diagnosis present

## 2023-01-11 DIAGNOSIS — Z89422 Acquired absence of other left toe(s): Secondary | ICD-10-CM | POA: Diagnosis not present

## 2023-01-11 DIAGNOSIS — Z7984 Long term (current) use of oral hypoglycemic drugs: Secondary | ICD-10-CM

## 2023-01-11 DIAGNOSIS — I70222 Atherosclerosis of native arteries of extremities with rest pain, left leg: Secondary | ICD-10-CM | POA: Diagnosis present

## 2023-01-11 DIAGNOSIS — M1990 Unspecified osteoarthritis, unspecified site: Secondary | ICD-10-CM | POA: Diagnosis present

## 2023-01-11 DIAGNOSIS — Z885 Allergy status to narcotic agent status: Secondary | ICD-10-CM

## 2023-01-11 DIAGNOSIS — D509 Iron deficiency anemia, unspecified: Secondary | ICD-10-CM | POA: Diagnosis present

## 2023-01-11 DIAGNOSIS — F419 Anxiety disorder, unspecified: Secondary | ICD-10-CM | POA: Diagnosis present

## 2023-01-11 DIAGNOSIS — I2510 Atherosclerotic heart disease of native coronary artery without angina pectoris: Secondary | ICD-10-CM | POA: Diagnosis present

## 2023-01-11 DIAGNOSIS — I110 Hypertensive heart disease with heart failure: Secondary | ICD-10-CM | POA: Diagnosis not present

## 2023-01-11 DIAGNOSIS — Z886 Allergy status to analgesic agent status: Secondary | ICD-10-CM | POA: Diagnosis not present

## 2023-01-11 DIAGNOSIS — I7090 Unspecified atherosclerosis: Principal | ICD-10-CM | POA: Diagnosis present

## 2023-01-11 DIAGNOSIS — Z8711 Personal history of peptic ulcer disease: Secondary | ICD-10-CM | POA: Diagnosis not present

## 2023-01-11 DIAGNOSIS — M62838 Other muscle spasm: Secondary | ICD-10-CM | POA: Diagnosis present

## 2023-01-11 DIAGNOSIS — Z95820 Peripheral vascular angioplasty status with implants and grafts: Secondary | ICD-10-CM

## 2023-01-11 DIAGNOSIS — Z91030 Bee allergy status: Secondary | ICD-10-CM | POA: Diagnosis not present

## 2023-01-11 DIAGNOSIS — I10 Essential (primary) hypertension: Secondary | ICD-10-CM | POA: Diagnosis present

## 2023-01-11 DIAGNOSIS — K5520 Angiodysplasia of colon without hemorrhage: Secondary | ICD-10-CM | POA: Diagnosis present

## 2023-01-11 DIAGNOSIS — R519 Headache, unspecified: Secondary | ICD-10-CM | POA: Diagnosis present

## 2023-01-11 DIAGNOSIS — E538 Deficiency of other specified B group vitamins: Secondary | ICD-10-CM | POA: Diagnosis present

## 2023-01-11 DIAGNOSIS — Z89421 Acquired absence of other right toe(s): Secondary | ICD-10-CM

## 2023-01-11 DIAGNOSIS — E1152 Type 2 diabetes mellitus with diabetic peripheral angiopathy with gangrene: Principal | ICD-10-CM | POA: Diagnosis present

## 2023-01-11 DIAGNOSIS — J449 Chronic obstructive pulmonary disease, unspecified: Secondary | ICD-10-CM | POA: Diagnosis present

## 2023-01-11 DIAGNOSIS — Z7902 Long term (current) use of antithrombotics/antiplatelets: Secondary | ICD-10-CM

## 2023-01-11 HISTORY — PX: TRANSMETATARSAL AMPUTATION: SHX6197

## 2023-01-11 HISTORY — PX: VEIN HARVEST: SHX6363

## 2023-01-11 HISTORY — PX: ENDARTERECTOMY FEMORAL: SHX5804

## 2023-01-11 LAB — SURGICAL PCR SCREEN
MRSA, PCR: NEGATIVE
Staphylococcus aureus: POSITIVE — AB

## 2023-01-11 LAB — COMPREHENSIVE METABOLIC PANEL
ALT: 10 U/L (ref 0–44)
AST: 19 U/L (ref 15–41)
Albumin: 2.6 g/dL — ABNORMAL LOW (ref 3.5–5.0)
Alkaline Phosphatase: 115 U/L (ref 38–126)
Anion gap: 18 — ABNORMAL HIGH (ref 5–15)
BUN: 26 mg/dL — ABNORMAL HIGH (ref 8–23)
CO2: 21 mmol/L — ABNORMAL LOW (ref 22–32)
Calcium: 9.4 mg/dL (ref 8.9–10.3)
Chloride: 91 mmol/L — ABNORMAL LOW (ref 98–111)
Creatinine, Ser: 2.11 mg/dL — ABNORMAL HIGH (ref 0.44–1.00)
GFR, Estimated: 26 mL/min — ABNORMAL LOW (ref >60)
Glucose, Bld: 201 mg/dL — ABNORMAL HIGH (ref 70–99)
Potassium: 3.3 mmol/L — ABNORMAL LOW (ref 3.5–5.1)
Sodium: 130 mmol/L — ABNORMAL LOW (ref 135–145)
Total Bilirubin: 0.5 mg/dL (ref 0.3–1.2)
Total Protein: 7 g/dL (ref 6.5–8.1)

## 2023-01-11 LAB — TYPE AND SCREEN: Unit division: 0

## 2023-01-11 LAB — CBC
HCT: 24 % — ABNORMAL LOW (ref 36.0–46.0)
Hemoglobin: 7.8 g/dL — ABNORMAL LOW (ref 12.0–15.0)
MCH: 28.1 pg (ref 26.0–34.0)
MCHC: 32.5 g/dL (ref 30.0–36.0)
MCV: 86.3 fL (ref 80.0–100.0)
Platelets: 676 10*3/uL — ABNORMAL HIGH (ref 150–400)
RBC: 2.78 MIL/uL — ABNORMAL LOW (ref 3.87–5.11)
RDW: 23.5 % — ABNORMAL HIGH (ref 11.5–15.5)
WBC: 28 10*3/uL — ABNORMAL HIGH (ref 4.0–10.5)
nRBC: 0 % (ref 0.0–0.2)

## 2023-01-11 LAB — POCT I-STAT 7, (LYTES, BLD GAS, ICA,H+H)
Acid-base deficit: 1 mmol/L (ref 0.0–2.0)
Bicarbonate: 24 mmol/L (ref 20.0–28.0)
Calcium, Ion: 1.19 mmol/L (ref 1.15–1.40)
HCT: 26 % — ABNORMAL LOW (ref 36.0–46.0)
Hemoglobin: 8.8 g/dL — ABNORMAL LOW (ref 12.0–15.0)
O2 Saturation: 100 %
Potassium: 3.2 mmol/L — ABNORMAL LOW (ref 3.5–5.1)
Sodium: 127 mmol/L — ABNORMAL LOW (ref 135–145)
TCO2: 25 mmol/L (ref 22–32)
pCO2 arterial: 39.8 mmHg (ref 32–48)
pH, Arterial: 7.387 (ref 7.35–7.45)
pO2, Arterial: 247 mmHg — ABNORMAL HIGH (ref 83–108)

## 2023-01-11 LAB — POCT I-STAT, CHEM 8
BUN: 24 mg/dL — ABNORMAL HIGH (ref 8–23)
Calcium, Ion: 1.23 mmol/L (ref 1.15–1.40)
Chloride: 92 mmol/L — ABNORMAL LOW (ref 98–111)
Creatinine, Ser: 2.1 mg/dL — ABNORMAL HIGH (ref 0.44–1.00)
Glucose, Bld: 183 mg/dL — ABNORMAL HIGH (ref 70–99)
HCT: 27 % — ABNORMAL LOW (ref 36.0–46.0)
Hemoglobin: 9.2 g/dL — ABNORMAL LOW (ref 12.0–15.0)
Potassium: 2.9 mmol/L — ABNORMAL LOW (ref 3.5–5.1)
Sodium: 128 mmol/L — ABNORMAL LOW (ref 135–145)
TCO2: 25 mmol/L (ref 22–32)

## 2023-01-11 LAB — BPAM RBC: Unit Type and Rh: 5100

## 2023-01-11 LAB — GLUCOSE, CAPILLARY
Glucose-Capillary: 211 mg/dL — ABNORMAL HIGH (ref 70–99)
Glucose-Capillary: 189 mg/dL — ABNORMAL HIGH (ref 70–99)
Glucose-Capillary: 133 mg/dL — ABNORMAL HIGH (ref 70–99)
Glucose-Capillary: 133 mg/dL — ABNORMAL HIGH (ref 70–99)
Glucose-Capillary: 248 mg/dL — ABNORMAL HIGH (ref 70–99)

## 2023-01-11 LAB — PREPARE RBC (CROSSMATCH)

## 2023-01-11 LAB — POCT ACTIVATED CLOTTING TIME
Activated Clotting Time: 217 seconds
Activated Clotting Time: 277 seconds

## 2023-01-11 LAB — PROTIME-INR
INR: 1 (ref 0.8–1.2)
Prothrombin Time: 13.5 seconds (ref 11.4–15.2)

## 2023-01-11 LAB — APTT: aPTT: 29 seconds (ref 24–36)

## 2023-01-11 SURGERY — ENDARTERECTOMY, FEMORAL
Anesthesia: General | Site: Toe | Laterality: Left

## 2023-01-11 MED ORDER — BISACODYL 5 MG PO TBEC: 5.0000 mg | DELAYED_RELEASE_TABLET | Freq: Every day | ORAL | Status: DC | PRN

## 2023-01-11 MED ORDER — OXYCODONE-ACETAMINOPHEN 5-325 MG PO TABS
1.0000 | ORAL_TABLET | ORAL | Status: DC | PRN
  Administered 2023-01-11: 2 via ORAL
  Administered 2023-01-12 (×2): 1 via ORAL
  Filled 2023-01-11 (×2): qty 1
  Filled 2023-01-11: qty 2

## 2023-01-11 MED ORDER — VANCOMYCIN HCL IN DEXTROSE 1-5 GM/200ML-% IV SOLN: 1000.0000 mg | Freq: Two times a day (BID) | INTRAVENOUS | Status: DC

## 2023-01-11 MED ORDER — GABAPENTIN 300 MG PO CAPS
300.0000 mg | ORAL_CAPSULE | Freq: Three times a day (TID) | ORAL | Status: DC
  Administered 2023-01-11 – 2023-01-12 (×2): 300 mg via ORAL
  Filled 2023-01-11 (×2): qty 1

## 2023-01-11 MED ORDER — ALUM & MAG HYDROXIDE-SIMETH 200-200-20 MG/5ML PO SUSP: 15.0000 mL | ORAL | Status: DC | PRN

## 2023-01-11 MED ORDER — ROCURONIUM BROMIDE 10 MG/ML (PF) SYRINGE
PREFILLED_SYRINGE | INTRAVENOUS | Status: AC
  Filled 2023-01-11: qty 10

## 2023-01-11 MED ORDER — LIDOCAINE 2% (20 MG/ML) 5 ML SYRINGE
INTRAMUSCULAR | Status: DC | PRN
  Administered 2023-01-11: 50 mg via INTRAVENOUS

## 2023-01-11 MED ORDER — CLONIDINE HCL (ANALGESIA) 100 MCG/ML EP SOLN
EPIDURAL | Status: DC | PRN
  Administered 2023-01-11: 80 ug

## 2023-01-11 MED ORDER — INSULIN ASPART 100 UNIT/ML IJ SOLN
0.0000 [IU] | INTRAMUSCULAR | Status: AC | PRN
  Administered 2023-01-11 (×2): 4 [IU] via SUBCUTANEOUS
  Filled 2023-01-11: qty 1

## 2023-01-11 MED ORDER — PROTAMINE SULFATE 10 MG/ML IV SOLN
INTRAVENOUS | Status: AC
  Filled 2023-01-11: qty 5

## 2023-01-11 MED ORDER — ACETAMINOPHEN 325 MG PO TABS: 325.0000 mg | ORAL_TABLET | ORAL | Status: DC | PRN

## 2023-01-11 MED ORDER — METFORMIN HCL ER 500 MG PO TB24: 500.0000 mg | ORAL_TABLET | Freq: Every day | ORAL | Status: DC

## 2023-01-11 MED ORDER — FLUOXETINE HCL 20 MG PO CAPS
40.0000 mg | ORAL_CAPSULE | Freq: Every day | ORAL | Status: DC
  Administered 2023-01-11 – 2023-01-12 (×2): 40 mg via ORAL
  Filled 2023-01-11 (×2): qty 2

## 2023-01-11 MED ORDER — SODIUM CHLORIDE 0.9 % IV SOLN: 500.0000 mL | Freq: Once | INTRAVENOUS | Status: DC | PRN

## 2023-01-11 MED ORDER — HEPARIN 6000 UNIT IRRIGATION SOLUTION
Status: DC | PRN
  Administered 2023-01-11: 1

## 2023-01-11 MED ORDER — LACTATED RINGERS IV SOLN: INTRAVENOUS | Status: DC

## 2023-01-11 MED ORDER — ONDANSETRON HCL 4 MG/2ML IJ SOLN
INTRAMUSCULAR | Status: AC
  Filled 2023-01-11: qty 2

## 2023-01-11 MED ORDER — CHLORHEXIDINE GLUCONATE 0.12 % MT SOLN
OROMUCOSAL | Status: AC
  Administered 2023-01-11: 15 mL via OROMUCOSAL
  Filled 2023-01-11: qty 15

## 2023-01-11 MED ORDER — HYDRALAZINE HCL 20 MG/ML IJ SOLN: 5.0000 mg | INTRAMUSCULAR | Status: DC | PRN

## 2023-01-11 MED ORDER — HEPARIN SODIUM (PORCINE) 1000 UNIT/ML IJ SOLN
INTRAMUSCULAR | Status: AC
  Filled 2023-01-11: qty 10

## 2023-01-11 MED ORDER — DEXAMETHASONE SODIUM PHOSPHATE 4 MG/ML IJ SOLN
INTRAMUSCULAR | Status: DC | PRN
  Administered 2023-01-11: 5 mg via INTRAVENOUS

## 2023-01-11 MED ORDER — HEMOSTATIC AGENTS (NO CHARGE) OPTIME
TOPICAL | Status: DC | PRN
  Administered 2023-01-11: 1

## 2023-01-11 MED ORDER — SENNOSIDES-DOCUSATE SODIUM 8.6-50 MG PO TABS: 1.0000 | ORAL_TABLET | Freq: Every evening | ORAL | Status: DC | PRN

## 2023-01-11 MED ORDER — PHENYLEPHRINE 80 MCG/ML (10ML) SYRINGE FOR IV PUSH (FOR BLOOD PRESSURE SUPPORT)
PREFILLED_SYRINGE | INTRAVENOUS | Status: DC | PRN
  Administered 2023-01-11: 120 ug via INTRAVENOUS
  Administered 2023-01-11 (×3): 80 ug via INTRAVENOUS
  Administered 2023-01-11: 160 ug via INTRAVENOUS
  Administered 2023-01-11 (×2): 80 ug via INTRAVENOUS

## 2023-01-11 MED ORDER — PROTAMINE SULFATE 10 MG/ML IV SOLN
INTRAVENOUS | Status: DC | PRN
  Administered 2023-01-11: 10 mg via INTRAVENOUS
  Administered 2023-01-11: 40 mg via INTRAVENOUS

## 2023-01-11 MED ORDER — ORAL CARE MOUTH RINSE: 15.0000 mL | Freq: Once | OROMUCOSAL | Status: AC

## 2023-01-11 MED ORDER — FENTANYL CITRATE (PF) 250 MCG/5ML IJ SOLN
INTRAMUSCULAR | Status: DC | PRN
  Administered 2023-01-11 (×2): 50 ug via INTRAVENOUS

## 2023-01-11 MED ORDER — DEXAMETHASONE SODIUM PHOSPHATE 10 MG/ML IJ SOLN
INTRAMUSCULAR | Status: AC
  Filled 2023-01-11: qty 1

## 2023-01-11 MED ORDER — FENTANYL CITRATE (PF) 250 MCG/5ML IJ SOLN
INTRAMUSCULAR | Status: AC
  Filled 2023-01-11: qty 5

## 2023-01-11 MED ORDER — ACETAMINOPHEN 500 MG PO TABS
ORAL_TABLET | ORAL | Status: AC
  Filled 2023-01-11: qty 2

## 2023-01-11 MED ORDER — VANCOMYCIN HCL IN DEXTROSE 1-5 GM/200ML-% IV SOLN
1000.0000 mg | INTRAVENOUS | Status: AC
  Administered 2023-01-11: 1000 mg via INTRAVENOUS
  Filled 2023-01-11: qty 200

## 2023-01-11 MED ORDER — CHLORHEXIDINE GLUCONATE 0.12 % MT SOLN: 15.0000 mL | Freq: Once | OROMUCOSAL | Status: AC

## 2023-01-11 MED ORDER — LABETALOL HCL 5 MG/ML IV SOLN: 10.0000 mg | INTRAVENOUS | Status: DC | PRN

## 2023-01-11 MED ORDER — ATORVASTATIN CALCIUM 80 MG PO TABS
80.0000 mg | ORAL_TABLET | Freq: Every day | ORAL | Status: DC
  Administered 2023-01-12: 80 mg via ORAL
  Filled 2023-01-11: qty 1

## 2023-01-11 MED ORDER — CHLORHEXIDINE GLUCONATE CLOTH 2 % EX PADS: 6.0000 | MEDICATED_PAD | Freq: Once | CUTANEOUS | Status: DC

## 2023-01-11 MED ORDER — MIDAZOLAM HCL 2 MG/2ML IJ SOLN
INTRAMUSCULAR | Status: AC
  Filled 2023-01-11: qty 2

## 2023-01-11 MED ORDER — SODIUM CHLORIDE 0.9 % IV SOLN: INTRAVENOUS | Status: DC

## 2023-01-11 MED ORDER — HEPARIN 6000 UNIT IRRIGATION SOLUTION
Status: AC
  Filled 2023-01-11: qty 500

## 2023-01-11 MED ORDER — CLOPIDOGREL BISULFATE 75 MG PO TABS
75.0000 mg | ORAL_TABLET | Freq: Every day | ORAL | Status: DC
  Administered 2023-01-11 – 2023-01-12 (×2): 75 mg via ORAL
  Filled 2023-01-11 (×2): qty 1

## 2023-01-11 MED ORDER — INSULIN ASPART 100 UNIT/ML IJ SOLN
0.0000 [IU] | Freq: Three times a day (TID) | INTRAMUSCULAR | Status: DC
  Administered 2023-01-12: 3 [IU] via SUBCUTANEOUS
  Administered 2023-01-12: 5 [IU] via SUBCUTANEOUS

## 2023-01-11 MED ORDER — PROPOFOL 10 MG/ML IV BOLUS
INTRAVENOUS | Status: DC | PRN
  Administered 2023-01-11: 70 mg via INTRAVENOUS

## 2023-01-11 MED ORDER — PHENYLEPHRINE 80 MCG/ML (10ML) SYRINGE FOR IV PUSH (FOR BLOOD PRESSURE SUPPORT)
PREFILLED_SYRINGE | INTRAVENOUS | Status: AC
  Filled 2023-01-11: qty 10

## 2023-01-11 MED ORDER — ONDANSETRON HCL 4 MG/2ML IJ SOLN: 4.0000 mg | Freq: Four times a day (QID) | INTRAMUSCULAR | Status: DC | PRN

## 2023-01-11 MED ORDER — PROPOFOL 10 MG/ML IV BOLUS
INTRAVENOUS | Status: AC
  Filled 2023-01-11: qty 20

## 2023-01-11 MED ORDER — ACETAMINOPHEN 650 MG RE SUPP: 325.0000 mg | RECTAL | Status: DC | PRN

## 2023-01-11 MED ORDER — POTASSIUM CHLORIDE CRYS ER 20 MEQ PO TBCR: 20.0000 meq | EXTENDED_RELEASE_TABLET | Freq: Every day | ORAL | Status: DC | PRN

## 2023-01-11 MED ORDER — EPHEDRINE SULFATE-NACL 50-0.9 MG/10ML-% IV SOSY
PREFILLED_SYRINGE | INTRAVENOUS | Status: DC | PRN
  Administered 2023-01-11: 10 mg via INTRAVENOUS

## 2023-01-11 MED ORDER — PHENOL 1.4 % MT LIQD: 1.0000 | OROMUCOSAL | Status: DC | PRN

## 2023-01-11 MED ORDER — 0.9 % SODIUM CHLORIDE (POUR BTL) OPTIME
TOPICAL | Status: DC | PRN
  Administered 2023-01-11: 2000 mL

## 2023-01-11 MED ORDER — ROCURONIUM BROMIDE 10 MG/ML (PF) SYRINGE
PREFILLED_SYRINGE | INTRAVENOUS | Status: DC | PRN
  Administered 2023-01-11: 40 mg via INTRAVENOUS
  Administered 2023-01-11: 20 mg via INTRAVENOUS

## 2023-01-11 MED ORDER — SACUBITRIL-VALSARTAN 49-51 MG PO TABS
1.0000 | ORAL_TABLET | Freq: Two times a day (BID) | ORAL | Status: DC
  Administered 2023-01-11 – 2023-01-12 (×2): 1 via ORAL
  Filled 2023-01-11 (×2): qty 1

## 2023-01-11 MED ORDER — HEPARIN SODIUM (PORCINE) 5000 UNIT/ML IJ SOLN
5000.0000 [IU] | Freq: Three times a day (TID) | INTRAMUSCULAR | Status: DC
  Administered 2023-01-12: 5000 [IU] via SUBCUTANEOUS
  Filled 2023-01-11: qty 1

## 2023-01-11 MED ORDER — DOCUSATE SODIUM 100 MG PO CAPS
100.0000 mg | ORAL_CAPSULE | Freq: Every day | ORAL | Status: DC
  Filled 2023-01-11: qty 1

## 2023-01-11 MED ORDER — ROPIVACAINE HCL 5 MG/ML IJ SOLN
INTRAMUSCULAR | Status: DC | PRN
  Administered 2023-01-11: 30 mL via EPIDURAL

## 2023-01-11 MED ORDER — ALPRAZOLAM 0.5 MG PO TABS: 0.5000 mg | ORAL_TABLET | Freq: Every evening | ORAL | Status: DC | PRN

## 2023-01-11 MED ORDER — SUGAMMADEX SODIUM 200 MG/2ML IV SOLN
INTRAVENOUS | Status: DC | PRN
  Administered 2023-01-11: 200 mg via INTRAVENOUS

## 2023-01-11 MED ORDER — POLYSACCHARIDE IRON COMPLEX 150 MG PO CAPS
150.0000 mg | ORAL_CAPSULE | Freq: Every day | ORAL | Status: DC
  Administered 2023-01-12: 150 mg via ORAL
  Filled 2023-01-11 (×2): qty 1

## 2023-01-11 MED ORDER — EPHEDRINE 5 MG/ML INJ
INTRAVENOUS | Status: AC
  Filled 2023-01-11: qty 5

## 2023-01-11 MED ORDER — DEXAMETHASONE SODIUM PHOSPHATE 10 MG/ML IJ SOLN
INTRAMUSCULAR | Status: DC | PRN
  Administered 2023-01-11: 4 mg via INTRAVENOUS

## 2023-01-11 MED ORDER — FENTANYL CITRATE (PF) 100 MCG/2ML IJ SOLN: 25.0000 ug | Freq: Once | INTRAMUSCULAR | Status: AC

## 2023-01-11 MED ORDER — HEPARIN SODIUM (PORCINE) 1000 UNIT/ML IJ SOLN
INTRAMUSCULAR | Status: DC | PRN
  Administered 2023-01-11: 3000 [IU] via INTRAVENOUS
  Administered 2023-01-11: 6000 [IU] via INTRAVENOUS

## 2023-01-11 MED ORDER — PANTOPRAZOLE SODIUM 40 MG PO TBEC
40.0000 mg | DELAYED_RELEASE_TABLET | Freq: Every day | ORAL | Status: DC
  Administered 2023-01-12: 40 mg via ORAL
  Filled 2023-01-11: qty 1

## 2023-01-11 MED ORDER — ACETAMINOPHEN 500 MG PO TABS
1000.0000 mg | ORAL_TABLET | Freq: Once | ORAL | Status: AC
  Administered 2023-01-11: 1000 mg via ORAL

## 2023-01-11 MED ORDER — ONDANSETRON HCL 4 MG/2ML IJ SOLN
INTRAMUSCULAR | Status: DC | PRN
  Administered 2023-01-11: 4 mg via INTRAVENOUS

## 2023-01-11 MED ORDER — LIDOCAINE 2% (20 MG/ML) 5 ML SYRINGE
INTRAMUSCULAR | Status: AC
  Filled 2023-01-11: qty 5

## 2023-01-11 MED ORDER — FENTANYL CITRATE (PF) 100 MCG/2ML IJ SOLN
INTRAMUSCULAR | Status: AC
  Administered 2023-01-11: 25 ug via INTRAVENOUS
  Filled 2023-01-11: qty 2

## 2023-01-11 MED ORDER — PHENYLEPHRINE HCL-NACL 20-0.9 MG/250ML-% IV SOLN
INTRAVENOUS | Status: DC | PRN
  Administered 2023-01-11: 60 ug/min via INTRAVENOUS

## 2023-01-11 MED ORDER — MAGNESIUM SULFATE 2 GM/50ML IV SOLN: 2.0000 g | Freq: Every day | INTRAVENOUS | Status: DC | PRN

## 2023-01-11 MED ORDER — FLEET ENEMA 7-19 GM/118ML RE ENEM: 1.0000 | ENEMA | Freq: Once | RECTAL | Status: DC | PRN

## 2023-01-11 MED ORDER — GUAIFENESIN-DM 100-10 MG/5ML PO SYRP: 15.0000 mL | ORAL_SOLUTION | ORAL | Status: DC | PRN

## 2023-01-11 MED ORDER — METOPROLOL TARTRATE 5 MG/5ML IV SOLN: 2.0000 mg | INTRAVENOUS | Status: DC | PRN

## 2023-01-11 MED ORDER — ASPIRIN 81 MG PO CHEW
81.0000 mg | CHEWABLE_TABLET | Freq: Every morning | ORAL | Status: DC
  Administered 2023-01-12: 81 mg via ORAL
  Filled 2023-01-11: qty 1

## 2023-01-11 MED ORDER — HYDROMORPHONE HCL 1 MG/ML IJ SOLN: 0.5000 mg | INTRAMUSCULAR | Status: DC | PRN

## 2023-01-11 SURGICAL SUPPLY — 64 items
APL PRP STRL LF DISP 70% ISPRP (MISCELLANEOUS) ×3
APL SKNCLS STERI-STRIP NONHPOA (GAUZE/BANDAGES/DRESSINGS) ×3
BAG COUNTER SPONGE SURGICOUNT (BAG) ×3 IMPLANT
BAG SPNG CNTER NS LX DISP (BAG) ×3
BENZOIN TINCTURE PRP APPL 2/3 (GAUZE/BANDAGES/DRESSINGS) ×3 IMPLANT
BLADE AVERAGE 25X9 (BLADE) IMPLANT
BLADE SURG 21 STRL SS (BLADE) ×3 IMPLANT
BNDG ELASTIC 4X5.8 VLCR STR LF (GAUZE/BANDAGES/DRESSINGS) ×3 IMPLANT
BNDG GAUZE DERMACEA FLUFF 4 (GAUZE/BANDAGES/DRESSINGS) ×3 IMPLANT
BNDG GZE 12X3 1 PLY HI ABS (GAUZE/BANDAGES/DRESSINGS) ×3
BNDG GZE DERMACEA 4 6PLY (GAUZE/BANDAGES/DRESSINGS) ×3
BNDG STRETCH GAUZE 3IN X12FT (GAUZE/BANDAGES/DRESSINGS) ×3 IMPLANT
CANISTER SUCT 3000ML PPV (MISCELLANEOUS) ×3 IMPLANT
CANNULA VESSEL 3MM 2 BLNT TIP (CANNULA) ×6 IMPLANT
CHLORAPREP W/TINT 26 (MISCELLANEOUS) ×3 IMPLANT
CLIP LIGATING EXTRA MED SLVR (CLIP) IMPLANT
CLIP LIGATING EXTRA SM BLUE (MISCELLANEOUS) IMPLANT
COVER PROBE CYLINDRICAL 5X96 (MISCELLANEOUS) IMPLANT
COVER SURGICAL LIGHT HANDLE (MISCELLANEOUS) ×3 IMPLANT
DRAIN CHANNEL 15F RND FF W/TCR (WOUND CARE) IMPLANT
DRAPE HALF SHEET 40X57 (DRAPES) ×3 IMPLANT
DRAPE ORTHO SPLIT 77X108 STRL (DRAPES) ×6
DRAPE SURG ORHT 6 SPLT 77X108 (DRAPES) ×6 IMPLANT
DRSG ADAPTIC 3X8 NADH LF (GAUZE/BANDAGES/DRESSINGS) IMPLANT
DRSG COVADERM 4X6 (GAUZE/BANDAGES/DRESSINGS) IMPLANT
ELECT REM PT RETURN 9FT ADLT (ELECTROSURGICAL) ×3
ELECTRODE REM PT RTRN 9FT ADLT (ELECTROSURGICAL) ×3 IMPLANT
EVACUATOR SILICONE 100CC (DRAIN) IMPLANT
GAUZE SPONGE 4X4 12PLY STRL (GAUZE/BANDAGES/DRESSINGS) ×3 IMPLANT
GAUZE SPONGE 4X4 12PLY STRL LF (GAUZE/BANDAGES/DRESSINGS) IMPLANT
GAUZE XEROFORM 5X9 LF (GAUZE/BANDAGES/DRESSINGS) ×3 IMPLANT
GLOVE BIO SURGEON STRL SZ8 (GLOVE) ×3 IMPLANT
GOWN STRL REUS W/ TWL LRG LVL3 (GOWN DISPOSABLE) ×6 IMPLANT
GOWN STRL REUS W/ TWL XL LVL3 (GOWN DISPOSABLE) ×3 IMPLANT
GOWN STRL REUS W/TWL LRG LVL3 (GOWN DISPOSABLE) ×6
GOWN STRL REUS W/TWL XL LVL3 (GOWN DISPOSABLE) ×3
HEMOSTAT SNOW SURGICEL 2X4 (HEMOSTASIS) IMPLANT
KIT BASIN OR (CUSTOM PROCEDURE TRAY) ×3 IMPLANT
KIT TURNOVER KIT B (KITS) ×3 IMPLANT
NS IRRIG 1000ML POUR BTL (IV SOLUTION) ×6 IMPLANT
PACK GENERAL/GYN (CUSTOM PROCEDURE TRAY) ×3 IMPLANT
PACK PERIPHERAL VASCULAR (CUSTOM PROCEDURE TRAY) ×3 IMPLANT
PAD ARMBOARD 7.5X6 YLW CONV (MISCELLANEOUS) ×6 IMPLANT
PATCH VASC XENOSURE 1CMX6CM (Vascular Products) IMPLANT
SET WALTER ACTIVATION W/DRAPE (SET/KITS/TRAYS/PACK) ×3 IMPLANT
STAPLER VISISTAT 35W (STAPLE) ×3 IMPLANT
STRIP CLOSURE SKIN 1/2X4 (GAUZE/BANDAGES/DRESSINGS) ×3 IMPLANT
STRIP CLOSURE SKIN 1/4X3 (GAUZE/BANDAGES/DRESSINGS) IMPLANT
SUT ETHILON 2 0 PSLX (SUTURE) ×9 IMPLANT
SUT ETHILON 3 0 PS 1 (SUTURE) ×3 IMPLANT
SUT MNCRL AB 4-0 PS2 18 (SUTURE) ×3 IMPLANT
SUT PROLENE 5 0 C 1 24 (SUTURE) ×3 IMPLANT
SUT PROLENE 6 0 BV (SUTURE) ×3 IMPLANT
SUT SILK 2 0SH CR/8 30 (SUTURE) IMPLANT
SUT VIC AB 2-0 CT1 18 (SUTURE) ×3 IMPLANT
SUT VIC AB 2-0 CT1 27 (SUTURE) ×6
SUT VIC AB 2-0 CT1 TAPERPNT 27 (SUTURE) ×3 IMPLANT
SUT VIC AB 3-0 SH 27 (SUTURE) ×3
SUT VIC AB 3-0 SH 27X BRD (SUTURE) ×3 IMPLANT
SUT VIC AB 3-0 SH 8-18 (SUTURE) ×3 IMPLANT
TOWEL GREEN STERILE (TOWEL DISPOSABLE) ×6 IMPLANT
TOWEL GREEN STERILE FF (TOWEL DISPOSABLE) ×3 IMPLANT
UNDERPAD 30X36 HEAVY ABSORB (UNDERPADS AND DIAPERS) ×3 IMPLANT
WATER STERILE IRR 1000ML POUR (IV SOLUTION) ×3 IMPLANT

## 2023-01-11 NOTE — Anesthesia Procedure Notes (Signed)
Procedure Name: Intubation Date/Time: 01/11/2023 12:07 PM  Performed by: Randon Goldsmith, CRNAPre-anesthesia Checklist: Patient identified, Emergency Drugs available, Suction available and Patient being monitored Patient Re-evaluated:Patient Re-evaluated prior to induction Oxygen Delivery Method: Circle system utilized Preoxygenation: Pre-oxygenation with 100% oxygen Induction Type: IV induction Ventilation: Mask ventilation without difficulty and Oral airway inserted - appropriate to patient size Laryngoscope Size: Mac and 3 Grade View: Grade I Tube type: Oral Tube size: 7.0 mm Number of attempts: 1 Airway Equipment and Method: Stylet and Oral airway Placement Confirmation: ETT inserted through vocal cords under direct vision, positive ETCO2 and breath sounds checked- equal and bilateral Secured at: 21 cm Tube secured with: Tape Dental Injury: Teeth and Oropharynx as per pre-operative assessment  Comments: Performed by Deloria Lair EMT student

## 2023-01-11 NOTE — Interval H&P Note (Signed)
History and Physical Interval Note:  01/11/2023 11:11 AM  [name]  has presented today for surgery, with the diagnosis of Atherosclerosis of native artery of left lower extremity with gangrene.  The various methods of treatment have been discussed with the patient and family. After consideration of risks, benefits and other options for treatment, the patient has consented to  Procedure(s): LEFT COMMON FEMORAL ENDARTERECTOMY (Left) TRANSMETATARSAL AMPUTATION (Left) as a surgical intervention.  The patient's history has been reviewed, patient examined, no change in status, stable for surgery.  I have reviewed the patient's chart and labs.  Questions were answered to the patient's satisfaction.     Tammy Mcpherson

## 2023-01-11 NOTE — Transfer of Care (Signed)
Immediate Anesthesia Transfer of Care Note  Patient: Tammy Mcpherson  Procedure(s) Performed: LEFT COMMON FEMORAL ENDARTERECTOMY WITH VEIN PATCH ANGIOPLASTY (Left) TRANSMETATARSAL AMPUTATION LEFT (Left: Toe) VEIN HARVEST LEFT GREATER SAPHENOUS  Patient Location: PACU  Anesthesia Type:GA combined with regional for post-op pain  Level of Consciousness: drowsy and patient cooperative  Airway & Oxygen Therapy: Patient Spontanous Breathing and Patient connected to face mask oxygen  Post-op Assessment: Report given to RN and Post -op Vital signs reviewed and stable  Post vital signs: Reviewed and stable  Last Vitals:  Vitals Value Taken Time  BP 136/62 01/11/23 1409  Temp    Pulse 102 01/11/23 1412  Resp 19 01/11/23 1412  SpO2 100 % 01/11/23 1412  Vitals shown include unvalidated device data.  Last Pain:  Vitals:   01/11/23 0935  PainSc: 9       Patients Stated Pain Goal: 2 (01/11/23 0935)  Complications: No notable events documented.

## 2023-01-11 NOTE — Anesthesia Procedure Notes (Signed)
Arterial Line Insertion Start/End4/08/2023 10:50 AM, 01/11/2023 11:05 AM Performed by: Lelon Perla, CRNA, CRNA  Patient location: Pre-op. Preanesthetic checklist: patient identified, IV checked, site marked, risks and benefits discussed, surgical consent, monitors and equipment checked, pre-op evaluation, timeout performed and anesthesia consent Lidocaine 1% used for infiltration Right, radial was placed Catheter size: 20 G Hand hygiene performed  and maximum sterile barriers used   Attempts: 2 Procedure performed without using ultrasound guided technique. Following insertion, dressing applied. Post procedure assessment: normal and unchanged  Patient tolerated the procedure well with no immediate complications.

## 2023-01-11 NOTE — Progress Notes (Signed)
PHARMACIST - PHYSICIAN COMMUNICATION DR:  Lenell Antu and colleagues CONCERNING:  METFORMIN SAFE ADMINISTRATION POLICY  RECOMMENDATION: Metformin has been placed on DISCONTINUE (rejected order) STATUS and should be reordered only after any of the conditions below are ruled out.   DESCRIPTION:  The Pharmacy Committee has adopted a policy that restricts the use of metformin in hospitalized patients until all the contraindications to administration have been ruled out:  [x]  eGFR below 30 mL/min/1.63m2 []  Acute or chronic metabolic acidosis (including DKA) []  Shock, acute MI, sepsis, hypoxemia, dehydration []  Administration of intra-arterial iodinated contrast: impending, or completed within past 48 hours* []  Administration of intravenous iodinated contrast: impending, or completed within past 48 hours, IF any of the following risk factors are also present**: eGFR 30 - 60 mL/min/1.14m2 history of hepatic impairment, alcoholism, or heart failure   Christoper Fabian, PharmD, BCPS Please see amion for complete clinical pharmacist phone list 01/11/2023 10:19 PM

## 2023-01-11 NOTE — Progress Notes (Signed)
Per PA Baglia 2nd unit of blood not needed at this time.  Will continue plan of care.

## 2023-01-11 NOTE — Op Note (Signed)
DATE OF SERVICE: 01/11/2023  PATIENT:  Tammy Mcpherson  67 y.o. female  PRE-OPERATIVE DIAGNOSIS:  left common femoral artery stenosis; left forefoot gangrene  POST-OPERATIVE DIAGNOSIS:  Same  PROCEDURE:   1) left greater saphenous vein harvest 2) left common femoral endarterectomy and saphenous vein patch angioplasty 3) left transmetatarsal amputation  SURGEON:  Surgeon(s) and Role:    * [name], MD - Primary  ASSISTANT: Nathanial Rancher, PA-C  An experienced assistant was required given the complexity of this procedure and the standard of surgical care. My assistant helped with exposure through counter tension, suctioning, ligation and retraction to better visualize the surgical field.  My assistant expedited sewing during the case by following my sutures. Wherever I use the term "we" in the report, my assistant actively helped me with that portion of the procedure.  ANESTHESIA:   general  EBL:  BLOOD ADMINISTERED: 1 u CC PRBC  DRAINS: none   LOCAL MEDICATIONS USED:  NONE  SPECIMEN:  none  COUNTS: confirmed correct.  TOURNIQUET:  none  PATIENT DISPOSITION:  PACU - hemodynamically stable.   Delay start of Pharmacological VTE agent (>24hrs) due to surgical blood loss or risk of bleeding: no  INDICATION FOR PROCEDURE: Tammy Mcpherson is a 67 y.o. female with left forefoot gangrene. Follow up imaging suggested significant common femoral artery stenosis. After careful discussion of risks, benefits, and alternatives the patient was offered left femoral endarterectomy and transmetatarsal amputation. The patient understood and wished to proceed.  OPERATIVE FINDINGS: Focal stenosis in mid-proximal CFA. Good result from endarterectomy. Unremarkable transmetatarsal amputation. Good bleeding at amputation margins.  DESCRIPTION OF PROCEDURE: After identification of the patient in the pre-operative holding area, the patient was transferred to the operating room. The patient was  positioned supine on the operating room table. Anesthesia was induced. The left leg was prepped and draped in standard fashion. A surgical pause was performed confirming correct patient, procedure, and operative location.  A longitudinal incision was made over the left common femoral artery.  This was carried down through subcutaneous tissue until the femoral sheath was encountered.  This was divided carefully.  The common femoral artery and its bifurcation were skeletonized.  Silastic Vesseloops were placed around the superficial femoral artery over the area of prior stenting.  Silastic Vesseloops were placed around the profunda femoris artery.  I was able to isolate a segment of healthy external iliac artery.  The patient was systemically heparinized.  Activated clotting time measurements were used throughout the case to confirm adequate anticoagulation.  Clamps were applied to the common femoral artery, profunda femoris and superficial femoral arteries.  An anterior arteriotomy was made on the common femoral artery and extended from the stent in the superficial femoral artery to healthy portion of common femoral artery just past its origin under the inguinal ligament.  I identified a focal stenosis in the common femoral artery.  A standard endarterectomy plane was made with a Astronomer and extended throughout the diseased segment of artery.  We identified the greater saphenous vein and traced it for several centimeters.  A segment of this was excised between 3-0 silk suture and passed off the table to dwell and heparinized saline.  After the endarterectomy plane was completed, the saphenous vein was opened longitudinally to work as a patch.  This was sewn to our arteriotomy using continuous running suture of 6-0 Prolene.  Prior to completion the patch was flushed and de-aired.  Several repair stitches were needed to  obtain hemostasis.  Ultimately good hemostasis was achieved.  The repair was evaluated  with Doppler machine.  Good Doppler flow was heard throughout the patch and into the superficial femoral artery.  There was good Doppler flow in the posterior tibial artery.  Heparin was reversed with protamine.  The groin was closed in layers using 2-0 Vicryl, 3-0 Vicryl, 4-0 Monocryl.  Benzoin and Steri-Strips were applied.  A fishmouth incision was made around the left forefoot.  This was carried down through subcutaneous tissue until the transmetatarsal bones were encountered using Bovie electrocautery.  The bones were cut using a reciprocating saw.  The wound was copiously irrigated.  Fairly good bleeding tissue was encountered.  The flaps were closed using 2-0 Vicryl and a skin stapler.  Adaptic, 4 x 4, Kerlix, and Ace wrap were applied to the foot.  Upon completion of the case instrument and sharps counts were confirmed correct. The patient was transferred to the PACU in good condition. I was present for all portions of the procedure.  Rande Brunt. Lenell Antu, MD [facility] Phone Number: [phone] 01/11/2023 2:13 PM

## 2023-01-11 NOTE — Anesthesia Procedure Notes (Signed)
Anesthesia Regional Block: Popliteal block   Pre-Anesthetic Checklist: , timeout performed,  Correct Patient, Correct Site, Correct Laterality,  Correct Procedure, Correct Position, site marked,  Risks and benefits discussed,  Surgical consent,  Pre-op evaluation,  At surgeon's request and post-op pain management  Laterality: Lower and Left  Prep: chloraprep       Needles:  Injection technique: Single-shot  Needle Type: Stimiplex     Needle Length: 10cm  Needle Gauge: 21     Additional Needles:   Procedures:,,,, ultrasound used (permanent image in chart),,   Motor weakness within 5 minutes.  Narrative:  Start time: 01/11/2023 10:02 AM End time: 01/11/2023 10:22 AM Injection made incrementally with aspirations every 5 mL.  Performed by: Personally  Anesthesiologist: Lewie Loron, MD  Additional Notes: Nerve located and needle positioned with direct ultrasound guidance. Good perineural spread. Patient tolerated well.

## 2023-01-11 NOTE — Progress Notes (Signed)
PHARMACY NOTE:  ANTIMICROBIAL RENAL DOSAGE ADJUSTMENT  Current antimicrobial regimen includes a mismatch between antimicrobial dosage and estimated renal function.  As per policy approved by the Pharmacy & Therapeutics and Medical Executive Committees, the antimicrobial dosage will be adjusted accordingly.  Current antimicrobial dosage:  Vancomycin 1gm q12h x 2 doses post-op  Indication: surgical prophylaxis  Renal Function:  Estimated Creatinine Clearance: 24 mL/min (A) (by C-G formula based on SCr of 2.1 mg/dL (H)).    Antimicrobial dosage has been changed to:  No further vancomycin needed post-op as pre-op dose will cover x 24 hours   Thank you for allowing pharmacy to be a part of this patient's care.  Christoper Fabian, PharmD, BCPS Please see amion for complete clinical pharmacist phone list 01/11/2023 4:00 PM

## 2023-01-12 ENCOUNTER — Encounter: Payer: Self-pay | Admitting: Physician Assistant

## 2023-01-12 LAB — LIPID PANEL
Cholesterol: 89 mg/dL (ref 0–200)
HDL: 33 mg/dL — ABNORMAL LOW (ref >40)
LDL Cholesterol: 34 mg/dL (ref 0–99)
Total CHOL/HDL Ratio: 2.7 RATIO
Triglycerides: 110 mg/dL (ref <150)
VLDL: 22 mg/dL (ref 0–40)

## 2023-01-12 LAB — CBC
HCT: 30.5 % — ABNORMAL LOW (ref 36.0–46.0)
Hemoglobin: 10.3 g/dL — ABNORMAL LOW (ref 12.0–15.0)
MCH: 28.7 pg (ref 26.0–34.0)
MCHC: 33.8 g/dL (ref 30.0–36.0)
MCV: 85 fL (ref 80.0–100.0)
Platelets: 439 10*3/uL — ABNORMAL HIGH (ref 150–400)
RBC: 3.59 MIL/uL — ABNORMAL LOW (ref 3.87–5.11)
RDW: 21.2 % — ABNORMAL HIGH (ref 11.5–15.5)
WBC: 19.5 10*3/uL — ABNORMAL HIGH (ref 4.0–10.5)
nRBC: 0 % (ref 0.0–0.2)

## 2023-01-12 LAB — BASIC METABOLIC PANEL
Anion gap: 10 (ref 5–15)
BUN: 23 mg/dL (ref 8–23)
CO2: 23 mmol/L (ref 22–32)
Calcium: 8.3 mg/dL — ABNORMAL LOW (ref 8.9–10.3)
Chloride: 96 mmol/L — ABNORMAL LOW (ref 98–111)
Creatinine, Ser: 1.52 mg/dL — ABNORMAL HIGH (ref 0.44–1.00)
GFR, Estimated: 38 mL/min — ABNORMAL LOW (ref >60)
Glucose, Bld: 174 mg/dL — ABNORMAL HIGH (ref 70–99)
Potassium: 4.1 mmol/L (ref 3.5–5.1)
Sodium: 129 mmol/L — ABNORMAL LOW (ref 135–145)

## 2023-01-12 LAB — GLUCOSE, CAPILLARY
Glucose-Capillary: 224 mg/dL — ABNORMAL HIGH (ref 70–99)
Glucose-Capillary: 168 mg/dL — ABNORMAL HIGH (ref 70–99)

## 2023-01-12 LAB — BPAM RBC
Blood Product Expiration Date: 202404282359
Blood Product Expiration Date: 202404302359
Blood Product Expiration Date: 202405102359
Blood Product Expiration Date: 202405102359
ISSUE DATE / TIME: 202404111140
ISSUE DATE / TIME: 202404111140
Unit Type and Rh: 5100

## 2023-01-12 LAB — TYPE AND SCREEN
Unit division: 0
Unit division: 0

## 2023-01-12 MED ORDER — OXYCODONE-ACETAMINOPHEN 5-325 MG PO TABS: 1.0000 | ORAL_TABLET | Freq: Four times a day (QID) | ORAL | 0 refills | Status: DC | PRN

## 2023-01-12 NOTE — Discharge Instructions (Signed)
Dry dressing changes daily.  You may shower and get the incisions wet in 48 hours.  Heel weight bearing in black Darco shoe.  Elevate your legs when at rest.

## 2023-01-12 NOTE — Discharge Summary (Signed)
[facility] Discharge Summary   Patient ID:  Tammy Mcpherson MRN: [id number] DOB/AGE: April 13, 1956 67 y.o.  Admit date: 01/11/2023 Discharge date: 01/12/2023 Date of Surgery: 01/11/2023 Surgeon: Surgeon(s): [name], MD  Admission Diagnosis: Atherosclerosis [I70.90] Critical limb ischemia of left lower extremity [I70.222]  Discharge Diagnoses:  Atherosclerosis [I70.90] Critical limb ischemia of left lower extremity [I70.222]  Secondary Diagnoses: Past Medical History:  Diagnosis Date   Anemia 04/30/2016   Anxiety    Arthritis    Cardiomyopathy    a. EF 40-45% by echo in 04/2016 b. Improved to 60-65% by repeat imaging in 2018   COPD (chronic obstructive pulmonary disease)    Coronary artery calcification seen on CT scan    Critical limb ischemia of both lower extremities    Essential hypertension    GERD (gastroesophageal reflux disease)    Headache    History of bronchitis    History of GI bleed    History of hiatal hernia    Hyperlipidemia    Iron deficiency anemia    Peripheral vascular disease    Pollen allergy    PSVT (paroxysmal supraventricular tachycardia)    PVC's (premature ventricular contractions)    Type 2 diabetes mellitus    Vitamin B12 deficiency     Procedure(s): LEFT COMMON FEMORAL ENDARTERECTOMY WITH VEIN PATCH ANGIOPLASTY TRANSMETATARSAL AMPUTATION LEFT VEIN HARVEST LEFT GREATER SAPHENOUS  Discharged Condition: stable  HPI: Tammy Mcpherson is a 67 y.o. female with left forefoot gangrene. Follow up imaging suggested significant common femoral artery stenosis.     Hospital Course:  Tammy Mcpherson is a 68 y.o. female is S/P  Procedure(s): LEFT COMMON FEMORAL ENDARTERECTOMY WITH VEIN PATCH ANGIOPLASTY TRANSMETATARSAL AMPUTATION LEFT VEIN HARVEST LEFT GREATER SAPHENOUS Her pain is minimal.  She has improved inflow with doppler signals left PT/AT/peroneal intact.  The dressing was maintained and she will remove on Monday and  allow them to stay open.  Heel weight bearing in Darco shoe.  Much improved Hgb and Scr after fluids and PRBC.  She was discharged home in stable condition post op day 1.  Follow up with me in 2-3 weeks for TMA wound check.    Significant Diagnostic Studies: CBC Lab Results  Component Value Date   WBC 19.5 (H) 01/12/2023   HGB 10.3 (L) 01/12/2023   HCT 30.5 (L) 01/12/2023   MCV 85.0 01/12/2023   PLT 439 (H) 01/12/2023    BMET    Component Value Date/Time   NA 129 (L) 01/12/2023 0151   NA 141 09/01/2020 1510   K 4.1 01/12/2023 0151   CL 96 (L) 01/12/2023 0151   CO2 23 01/12/2023 0151   GLUCOSE 174 (H) 01/12/2023 0151   BUN 23 01/12/2023 0151   BUN 7 (L) 09/01/2020 1510   CREATININE 1.52 (H) 01/12/2023 0151   CREATININE 0.64 08/12/2021 1446   CALCIUM 8.3 (L) 01/12/2023 0151   GFRNONAA 38 (L) 01/12/2023 0151   GFRNONAA 90 09/28/2017 1201   GFRAA 115 09/01/2020 1510   GFRAA 104 09/28/2017 1201   COAG Lab Results  Component Value Date   INR 1.0 01/11/2023   INR 1.2 12/08/2022   INR 0.98 04/24/2017     Disposition:  Discharge to :Home Discharge Instructions     Ambulatory referral to Physical Therapy   Complete by: As directed    Call MD for:  redness, tenderness, or signs of infection (pain, swelling, bleeding, redness, odor or green/yellow discharge around incision site)  Complete by: As directed    Call MD for:  severe or increased pain, loss or decreased feeling  in affected limb(s)   Complete by: As directed    Call MD for:  temperature >100.5   Complete by: As directed    Resume previous diet   Complete by: As directed       Allergies as of 01/12/2023       Reactions   Bee Venom Anaphylaxis, Swelling   Codeine Anaphylaxis, Other (See Comments)   Tongue swelled   Penicillins Anaphylaxis, Swelling, Rash   Has patient had a PCN reaction causing immediate rash, facial/tongue/throat swelling, SOB or lightheadedness with hypotension: No, delayed Has  patient had a PCN reaction causing severe rash involving mucus membranes or skin necrosis: No Has patient had a PCN reaction that required hospitalization No Has patient had a PCN reaction occurring within the last 10 years: No If all of the above answers are "NO", then may proceed with Cephalosporin use.   Bupropion Other (See Comments)   "Made my skin crawl"   Sudafed [pseudoephedrine Hcl] Rash        Medication List     TAKE these medications    Acetaminophen 500 MG capsule Take 500-1,000 mg by mouth every 6 (six) hours as needed for pain (or headaches).   albuterol (2.5 MG/3ML) 0.083% nebulizer solution Commonly known as: PROVENTIL Take 3 mLs (2.5 mg total) by nebulization every 4 (four) hours as needed for wheezing or shortness of breath.   albuterol 108 (90 Base) MCG/ACT inhaler Commonly known as: VENTOLIN HFA INHALE TWO PUFFS INTO THE LUNGS EVERY SIX HOURS AS NEEDED FOR WHEEZING OR SHORTNESS OF BREATH   ALPRAZolam 0.5 MG tablet Commonly known as: XANAX TAKE ONE TABLET (0.5MG  TOTAL) BY MOUTH DAILY AS NEEDED FOR ANXIETY   aspirin 81 MG chewable tablet Chew 1 tablet (81 mg total) by mouth every morning.   atorvastatin 80 MG tablet Commonly known as: LIPITOR Take 1 tablet (80 mg total) by mouth daily. What changed: when to take this   blood glucose meter kit and supplies Dispense based on patient and insurance preference. Use up to four times daily as directed. (FOR ICD-10 E10.9, E11.9).   clopidogrel 75 MG tablet Commonly known as: PLAVIX Take 1 tablet (75 mg total) by mouth daily. TAKE ONE TABLET (75MG  TOTAL) BY MOUTH DAILY WITH BREAKFAST   cyanocobalamin 1000 MCG/ML injection Commonly known as: VITAMIN B12 INJECT 1 ML INTO THE MUSCLE EVERY 30 DAYS. What changed: See the new instructions.   cyclobenzaprine 5 MG tablet Commonly known as: FLEXERIL Take 1 tablet (5 mg total) by mouth 3 (three) times daily as needed for muscle spasms.   dicyclomine 10 MG  capsule Commonly known as: BENTYL Take 1 capsule (10 mg total) by mouth 2 (two) times daily as needed for spasms.   Entresto 49-51 MG Generic drug: sacubitril-valsartan Take 1 tablet by mouth 2 (two) times daily.   FLUoxetine 40 MG capsule Commonly known as: PROZAC Take 1 capsule (40 mg total) by mouth daily.   FreeStyle [place] 2 Reader [facility] Check blood glucose up to 4 times daily   FreeStyle Libre 2 Sensor Misc Apply one sensor to the upper arm avery 14 days. What changed:  how much to take how to take this when to take this additional instructions   gabapentin 300 MG capsule Commonly known as: NEURONTIN TAKE ONE CAPSULE (300 MG TOTAL) BY MOUTHTWO TIMES DAILY. What changed:  how much to take  how to take this when to take this additional instructions   glucose blood test strip Commonly known as: OneTouch Ultra Use as instructed   glucose monitoring kit monitoring kit Use to check blood sugar BID as directed   iron polysaccharides 150 MG capsule Commonly known as: NIFEREX Take 1 capsule (150 mg total) by mouth daily.   metFORMIN 500 MG 24 hr tablet Commonly known as: GLUCOPHAGE-XR Take 1 tablet (500 mg total) by mouth daily with breakfast. What changed:  when to take this reasons to take this   Misc. Devices Misc Please provide supplies (needle, syringe, alcohol swabs) needed for patient to self-administer B-12 injections monthly.   OneTouch Delica Plus Lancet33G Misc 1 each by Does not apply route 2 (two) times a day.   oxyCODONE-acetaminophen 5-325 MG tablet Commonly known as: PERCOCET/ROXICET Take 1 tablet by mouth every 6 (six) hours as needed for moderate pain.   pantoprazole 40 MG tablet Commonly known as: PROTONIX TAKE ONE (1) TABLET 30 MINS BEFORE YOUR FIRST MEAL.   traMADol 50 MG tablet Commonly known as: ULTRAM Take 1 tablet (50 mg total) by mouth every 6 (six) hours as needed.   UltiCare Tuberculin Safety Syr 25G X 1" 1 ML Misc Generic  drug: Tuberculin-Allergy Syringes USE TO ADMINISTER INJECTABLE VITAMIN B12.   Vitamin D 50 MCG (2000 UT) Caps Take 1 capsule (2,000 Units total) by mouth daily.       Verbal and written Discharge instructions given to the patient. Wound care per Discharge AVS  Follow-up Information     [facility] Follow up.   Why: The rehab should contact you for the first appointment. If no call in a couple days please call the rehab Contact information: [facility], A Service of [facility] [address] [place], [place] [address] [phone]                Signed: Mosetta Pigeon 01/12/2023, 3:31 PM

## 2023-01-12 NOTE — Evaluation (Signed)
Physical Therapy Evaluation Patient Details Name: Tammy Mcpherson MRN: [id number] DOB: [birth year] Today's Date: 01/12/2023  History of Present Illness  67 yo female admitted 4/11 for Lt CFA endarterectomy and Lt transmet amp. PMHx: HTN, T2Dm, COPD, GIB, PAD  Clinical Impression  PT pleasant and reports having not walked much in the last month, relying on husbands assist for mobility and safety. Pt needing min assist to don darco shoe and minguard for mobility with cues for sequence and safety. PT with decreased transfers, gait and functional mobility who will benefit from acute therapy to maximize safety and independence as well as OPPT. Encouraged OOB to [facility] during day and only use of purewick at night acutely and OOB to chair for meals.      Recommendations for follow up therapy are one component of a multi-disciplinary discharge planning process, led by the attending physician.  Recommendations may be updated based on patient status, additional functional criteria and insurance authorization.  Follow Up Recommendations       Assistance Recommended at Discharge Intermittent Supervision/Assistance  Patient can return home with the following  A little help with walking and/or transfers;A little help with bathing/dressing/bathroom    Equipment Recommendations None recommended by PT  Recommendations for Other Services       Functional Status Assessment Patient has had a recent decline in their functional status and demonstrates the ability to make significant improvements in function in a reasonable and predictable amount of time.     Precautions / Restrictions Precautions Precautions: Fall Required Braces or Orthoses: Other Brace Other Brace: Darco shoe on L Restrictions Weight Bearing Restrictions: Yes LLE Weight Bearing: Partial weight bearing LLE Partial Weight Bearing Percentage or Pounds: through heel with Darco shoe      Mobility  Bed Mobility Overal bed mobility: Needs  Assistance Bed Mobility: Supine to Sit     Supine to sit: Supervision, HOB elevated     General bed mobility comments: guarding pt with LOB to right with sitting EOB, assist for lines and safety    Transfers Overall transfer level: Needs assistance   Transfers: Sit to/from Stand Sit to Stand: Min guard           General transfer comment: cues for heel weight bearing and hand placement    Ambulation/Gait Ambulation/Gait assistance: Min guard Gait Distance (Feet): 80 Feet Assistive device: Rolling walker (2 wheels) Gait Pattern/deviations: Step-to pattern, Narrow base of support   Gait velocity interpretation: <1.8 ft/sec, indicate of risk for recurrent falls   General Gait Details: cues for focusing weight on Lt heel, increased BOS, posture and proximity to [employer] Mobility    Modified Rankin (Stroke Patients Only)       Balance Overall balance assessment: Needs assistance   Sitting balance-Leahy Scale: Poor Sitting balance - Comments: Rt LOB sitting EOB x 3 with pt aware but unable to correct   Standing balance support: During functional activity, Reliant on assistive device for balance, Bilateral upper extremity supported Standing balance-Leahy Scale: Poor Standing balance comment: RW in standing                             Pertinent Vitals/Pain Pain Assessment Faces Pain Scale: Hurts whole lot Pain Location: Lt groin and mid foot Pain Descriptors / Indicators: Aching, Sore Pain Intervention(s): Limited activity within patient's tolerance, Monitored during session, Repositioned, Patient requesting pain  meds-RN notified    Home Living Family/patient expects to be discharged to:: Private residence Living Arrangements: Spouse/significant other Available Help at Discharge: Family;Available 24 hours/day Type of Home: House Home Access: Stairs to enter   [employer] of Steps: 1 small threshold   Home  Layout: One level Home Equipment: Transport chair;BSC/3in1;Shower seat;Grab bars - tub/shower;Hand held Programmer, systems (2 wheels) Additional Comments: 2L O2 PRN at baseline at home but hus band states she never uses it. They check her O2 and she never needs it.    Prior Function Prior Level of Function : Needs assist             Mobility Comments: spouse had been providing HHA to ambulate in house. ADLs Comments: spouse has been assisting with bathing and dressing for last month or so.     Hand Dominance   Dominant Hand: Right    Extremity/Trunk Assessment   Upper Extremity Assessment Upper Extremity Assessment: Defer to OT evaluation    Lower Extremity Assessment Lower Extremity Assessment: Generalized weakness    Cervical / Trunk Assessment Cervical / Trunk Assessment: Kyphotic  Communication   Communication: No difficulties  Cognition Arousal/Alertness: Awake/alert Behavior During Therapy: WFL for tasks assessed/performed Overall Cognitive Status: Within Functional Limits for tasks assessed                                          General Comments General comments (skin integrity, edema, etc.): Pt doing well for for first time up. Transferred sit to stand x3 with increased indepencence each time and to commode with min assist to min guard by thrird attempt.    Exercises     Assessment/Plan    PT Assessment Patient needs continued PT services  PT Problem List Decreased activity tolerance;Decreased balance;Decreased mobility;Pain;Decreased knowledge of precautions;Decreased knowledge of use of DME       PT Treatment Interventions DME instruction;Therapeutic exercise;Gait training;Functional mobility training;Patient/family education;Therapeutic activities    PT Goals (Current goals can be found in the Care Plan section)  Acute Rehab PT Goals Patient Stated Goal: return home and take care of my plants PT Goal Formulation: With  patient/family Time For Goal Achievement: 01/19/23 Potential to Achieve Goals: Good    Frequency Min 1X/week     Co-evaluation               AM-PAC PT "6 Clicks" Mobility  Outcome Measure Help needed turning from your back to your side while in a flat bed without using bedrails?: A Little Help needed moving from lying on your back to sitting on the side of a flat bed without using bedrails?: A Little Help needed moving to and from a bed to a chair (including a wheelchair)?: A Little Help needed standing up from a chair using your arms (e.g., wheelchair or bedside chair)?: A Little Help needed to walk in hospital room?: A Little Help needed climbing 3-5 steps with a railing? : A Lot 6 Click Score: 17    End of Session Equipment Utilized During Treatment: Gait belt Activity Tolerance: Patient tolerated treatment well Patient left: in bed;with call bell/phone within reach;with family/visitor present Nurse Communication: Mobility status PT Visit Diagnosis: Other abnormalities of gait and mobility (R26.89);Difficulty in walking, not elsewhere classified (R26.2)    Time: 1020-1047 PT Time Calculation (min) (ACUTE ONLY): 27 min   Charges:   PT Evaluation $PT Eval Moderate  Complexity: 1 Mod PT Treatments $Gait Training: 8-22 mins        Merryl Hacker, PT Acute Rehabilitation Services Office: [phone]   Cristine Polio 01/12/2023, 10:53 AM

## 2023-01-12 NOTE — Evaluation (Signed)
Occupational Therapy Evaluation Patient Details Name: Tammy Mcpherson MRN: [id number] DOB: Feb 12, 1956 Today's Date: 01/12/2023   History of Present Illness 67 yo female admitted 4/11 for Lt CFA endarterectomy and Lt transmet amp. PMHx: HTN, T2Dm, COPD, GIB, PAD   Clinical Impression   Pt admitted with the above diagnosis and has the deficits listed below. From OT standpoint, pt is doing better than before admission due to pain being under much better control. Pt is now able to do do most of her LE dressing, bathing and transfers to toilet with min assist and cues on how to use walker and reminders about putting weight through heel with Darco shoe on L.  Do not feel pt will need OT services post acute due to pt's increased ability to do most adls and husband is available at home at all times.  Pt did state she was interested in OPPT bc she does not want to have to use the walker for an extended amount of time.  Will follow acutely to focus on toileting/tranfers and tub transfers.      Recommendations for follow up therapy are one component of a multi-disciplinary discharge planning process, led by the attending physician.  Recommendations may be updated based on patient status, additional functional criteria and insurance authorization.   Assistance Recommended at Discharge Intermittent Supervision/Assistance  Patient can return home with the following A little help with walking and/or transfers;A little help with bathing/dressing/bathroom;Assist for transportation;Assistance with cooking/housework    Functional Status Assessment  Patient has had a recent decline in their functional status and demonstrates the ability to make significant improvements in function in a reasonable and predictable amount of time.  Equipment Recommendations  Tub/shower bench;Other (comment) (wants to discuss. told her most likely not covered by insurance)    Recommendations for Other Services       Precautions /  Restrictions Precautions Precautions: Fall Required Braces or Orthoses: Other Brace Other Brace: Darco shoe on L Restrictions Weight Bearing Restrictions: Yes LLE Weight Bearing: Partial weight bearing LLE Partial Weight Bearing Percentage or Pounds: through heel with Darco shoe      Mobility Bed Mobility Overal bed mobility: Needs Assistance Bed Mobility: Supine to Sit     Supine to sit: Supervision, HOB elevated     General bed mobility comments: Pt did not need physical assist to get to EOB; needed encouragement to do on her own. Pt is accustomed to husband doing for her but is able to do a lot for herself now that pain is under control.    Transfers Overall transfer level: Needs assistance Equipment used: Rolling walker (2 wheels) Transfers: Sit to/from Stand, Bed to chair/wheelchair/BSC Sit to Stand: Min guard     Step pivot transfers: Min assist     General transfer comment: cues for bearing weight through heel of Darco shoe and assist moving walker while transferring.      Balance Overall balance assessment: Needs assistance Sitting-balance support: Feet supported Sitting balance-Leahy Scale: Good     Standing balance support: During functional activity, Reliant on assistive device for balance, Bilateral upper extremity supported Standing balance-Leahy Scale: Poor Standing balance comment: Pt reliant on walker for balance in standing.                           ADL either performed or assessed with clinical judgement   ADL Overall ADL's : Needs assistance/impaired Eating/Feeding: Independent;Sitting   Grooming: Wash/dry face;Wash/dry hands;Oral care;Set up;Sitting  Upper Body Bathing: Set up;Sitting   Lower Body Bathing: Minimal assistance;Sit to/from stand;Cueing for compensatory techniques Lower Body Bathing Details (indicate cue type and reason): assist only when standing. Cues to put weight on L heel Upper Body Dressing : Set up;Sitting    Lower Body Dressing: Minimal assistance;Sit to/from stand;Cueing for compensatory techniques Lower Body Dressing Details (indicate cue type and reason): assist with pants over dressing to keep dressing on.  If pt did not have dressing, pt would not need assist with this. Assist to keep balance in standing to pull pants up. Toilet Transfer: Minimal Market researcher Details (indicate cue type and reason): Pt took appx 5 steps to [facility] Toileting- Clothing Manipulation and Hygiene: Minimal assistance;Sit to/from stand;Cueing for compensatory techniques     Tub/Shower Transfer Details (indicate cue type and reason): Pt is interested in tub bench but only if covered by insurance. Functional mobility during ADLs: Minimal assistance;Rolling walker (2 wheels) General ADL Comments: Pt is actually more independent with adls now that surgery is complete. Pt was in so much pain prior to surgery that husband was doing a lot of dressing etc for pt. Pt able to do more now.     Vision Baseline Vision/History: 0 No visual deficits Ability to See in Adequate Light: 0 Adequate Patient Visual Report: No change from baseline Vision Assessment?: No apparent visual deficits     Perception Perception Perception Tested?: No   Praxis      Pertinent Vitals/Pain Pain Assessment Pain Assessment: Faces Faces Pain Scale: Hurts a little bit Pain Location: L ankle Pain Descriptors / Indicators: Aching, Operative site guarding, Sore Pain Intervention(s): Limited activity within patient's tolerance, Monitored during session, Repositioned     Hand Dominance Right   Extremity/Trunk Assessment Upper Extremity Assessment Upper Extremity Assessment: Overall WFL for tasks assessed   Lower Extremity Assessment Lower Extremity Assessment: Defer to PT evaluation   Cervical / Trunk Assessment Cervical / Trunk Assessment: Normal   Communication Communication Communication: No  difficulties   Cognition Arousal/Alertness: Awake/alert Behavior During Therapy: WFL for tasks assessed/performed Overall Cognitive Status: Within Functional Limits for tasks assessed                                       General Comments  Pt doing well for for first time up. Transferred sit to stand x3 with increased indepencence each time and to commode with min assist to min guard by thrird attempt.    Exercises     Shoulder Instructions      Home Living Family/patient expects to be discharged to:: Private residence Living Arrangements: Spouse/significant other Available Help at Discharge: Family;Available 24 hours/day Type of Home: House Home Access: Stairs to enter [employer] of Steps: 1 small threshold   Home Layout: One level     Bathroom Shower/Tub: Chief Strategy Officer: Standard     Home Equipment: Research scientist (life sciences) seat;Grab bars - tub/shower;Hand held Programmer, systems (2 wheels)   Additional Comments: 2L O2 PRN at baseline at home but hus band states she never uses it. They check her O2 and she never needs it.      Prior Functioning/Environment Prior Level of Function : Needs assist             Mobility Comments: spouse had been providing HHA to ambulate in house. ADLs Comments: spouse has been assisting with bathing and dressing for  last month or so.        OT Problem List: Decreased knowledge of use of DME or AE;Decreased knowledge of precautions;Pain;Decreased activity tolerance      OT Treatment/Interventions: Self-care/ADL training;Balance training;DME and/or AE instruction    OT Goals(Current goals can be found in the care plan section) Acute Rehab OT Goals Patient Stated Goal: to go home OT Goal Formulation: With patient Time For Goal Achievement: 01/26/23 Potential to Achieve Goals: Good ADL Goals Pt Will Perform Grooming: with supervision;standing Pt Will Perform Lower  Body Dressing: with supervision;sit to/from stand Additional ADL Goal #1: Pt will walk to bathroom with walker and complete all toileting with supervision.  OT Frequency: Min 2X/week    Co-evaluation              AM-PAC OT "6 Clicks" Daily Activity     Outcome Measure Help from another person eating meals?: None Help from another person taking care of personal grooming?: None Help from another person toileting, which includes using toliet, bedpan, or urinal?: A Little Help from another person bathing (including washing, rinsing, drying)?: A Little Help from another person to put on and taking off regular upper body clothing?: None Help from another person to put on and taking off regular lower body clothing?: A Little 6 Click Score: 21   End of Session Equipment Utilized During Treatment: Rolling walker (2 wheels) Nurse Communication: Mobility status  Activity Tolerance: Patient tolerated treatment well Patient left: in chair;with call bell/phone within reach;with chair alarm set  OT Visit Diagnosis: Unsteadiness on feet (R26.81)                Time: 4097-3532 OT Time Calculation (min): 39 min Charges:  OT General Charges $OT Visit: 1 Visit OT Evaluation $OT Eval Moderate Complexity: 1 Mod OT Treatments $Self Care/Home Management : 8-22 mins  Hope Budds 01/12/2023, 9:10 AM

## 2023-01-12 NOTE — Progress Notes (Addendum)
[facility] [place]  Subjective  - Comfortable    Objective (!) 140/66 (!) 102 97.8 F (36.6 C) (Oral) 15 96%  Intake/Output Summary (Last 24 hours) at 01/12/2023 0732 Last data filed at 01/12/2023 4142 Gross per 24 hour  Intake 2319.54 ml  Output 825 ml  Net 1494.54 ml    Left PT/AT/ peroneal signals on the left LE Dressing clean and dry Groin soft  Lungs non labored breathing  Assessment/Planning: POD # 1  PROCEDURE:   1) left greater saphenous vein harvest 2) left common femoral endarterectomy and saphenous vein patch angioplasty 3) left transmetatarsal amputation  Patent tibials with doppler signals  Will change dressing in the am Pending mobility and comfort  Possible discharge tomorrow Hyponatremia, anemia  now 10.3 transfusion of 1 unit PRBC Cr 2.11 will restart 50 cc NS for  hydration re check labs in the am.  Mosetta Pigeon 01/12/2023 7:32 AM --  Laboratory Lab Results: Recent Labs    01/11/23 0933 01/11/23 1125 01/11/23 1229 01/12/23 0151  WBC 28.0*  --   --  19.5*  HGB 7.8*   < > 8.8* 10.3*  HCT 24.0*   < > 26.0* 30.5*  PLT 676*  --   --  439*   < > = values in this interval not displayed.   BMET Recent Labs    01/11/23 0933 01/11/23 1125 01/11/23 1229 01/12/23 0151  NA 130* 128* 127* 129*  K 3.3* 2.9* 3.2* 4.1  CL 91* 92*  --  96*  CO2 21*  --   --  23  GLUCOSE 201* 183*  --  174*  BUN 26* 24*  --  23  CREATININE 2.11* 2.10*  --  1.52*  CALCIUM 9.4  --   --  8.3*    COAG Lab Results  Component Value Date   INR 1.0 01/11/2023   INR 1.2 12/08/2022   INR 0.98 04/24/2017   No results found for: "PTT"   VASCULAR STAFF ADDENDUM: I have independently interviewed and examined the patient. I agree with the above.  Looks great POD#1 s/p LCFA endart and L TMA Much improved Hgb and Scr after fluids and PRBC Wants to go home.  Follow up with me in 2-3 weeks for TMA wound check.  Rande Brunt. Lenell Antu,  MD [facility] Phone Number: [phone] 01/12/2023 12:10 PM

## 2023-01-12 NOTE — Progress Notes (Signed)
Inpatient Diabetes Program Recommendations  AACE/ADA: New Consensus Statement on Inpatient Glycemic Control (2015)  Target Ranges:  Prepandial:   less than 140 mg/dL      Peak postprandial:   less than 180 mg/dL (1-2 hours)      Critically ill patients:  140 - 180 mg/dL   Lab Results  Component Value Date   GLUCAP 224 (H) 01/12/2023   HGBA1C 7.3 (H) 10/25/2022    Review of Glycemic Control  Latest Reference Range & Units 01/11/23 08:59 01/11/23 12:28 01/11/23 14:12 01/11/23 16:53 01/11/23 21:11 01/12/23 06:17  Glucose-Capillary 70 - 99 mg/dL 253 (H) 664 (H) 403 (H) 133 (H) 248 (H) 224 (H)   Diabetes history: DM  Outpatient Diabetes medications:  FS Libre Metformin 500 mg prn high blood  sugar? Current orders for Inpatient glycemic control:  Novolog 0-15 units tid with meals  Inpatient Diabetes Program Recommendations:    Note blood sugars increased>200 mg/dL this AM.  Patient did get Decadron 9 mg yesterday in surgery which likely increased blood sugars.  May consider adding Semglee 8 units daily while in the hospital.   Thanks,  Beryl Meager, RN, BC-ADM Inpatient Diabetes Coordinator Pager [phone]  (8a-5p)

## 2023-01-12 NOTE — TOC Transition Note (Signed)
Transition of Care [facility]) - CM/SW Discharge Note   Patient Details  Name: Tammy Mcpherson MRN: [id number] Date of Birth: Nov 04, 1955  [facility] CM/SW Contact:  Kermit Balo, RN Phone Number: 01/12/2023, 1:40 PM   Clinical Narrative:    Pt is discharging home with her spouse. He is able to provide supervision at home.  DME at home: walker/ shower seat/ wheelchair/ 3 in 1 Spouse over sees her medications at home.  Spouse provides needed transportation.  Pt prefers outpatient in [place], [place]. CM is faxing orders to: [phone] [facility] Information on the AVS. Spouse transporting home.   Final next level of care: OP Rehab Barriers to Discharge: No Barriers Identified   Patient Goals and CMS Choice   Choice offered to / list presented to : Patient  Discharge Placement                         Discharge Plan and Services Additional resources added to the After Visit Summary for                                       Social Determinants of Health (SDOH) Interventions SDOH Screenings   Food Insecurity: No Food Insecurity (12/09/2022)  Housing: Low Risk  (12/08/2022)  Transportation Needs: No Transportation Needs (12/09/2022)  Utilities: Not At Risk (12/09/2022)  Alcohol Screen: Low Risk  (10/27/2019)  Depression (PHQ2-9): Medium Risk (12/13/2022)  Tobacco Use: High Risk (01/11/2023)     Readmission Risk Interventions    12/19/2022   11:39 AM  Readmission Risk Prevention Plan  Transportation Screening Complete  PCP or Specialist Appt within 3-5 Days Complete  HRI or Home Care Consult Complete  Social Work Consult for Recovery Care Planning/Counseling Complete  Palliative Care Screening Not Applicable  Medication Review Oceanographer) Complete

## 2023-01-12 NOTE — Progress Notes (Signed)
Pt discharging home with husband.  All instructions given and reviewed, all questions answered.

## 2023-01-12 NOTE — Progress Notes (Signed)
Orthopedic Tech Progress Note Patient Details:  Tammy Mcpherson September 29, 1956 [id number]  Ortho Devices Type of Ortho Device: Darco shoe Ortho Device/Splint Location: LLE Ortho Device/Splint Interventions: Ordered      [name] Tyrone Balash 01/12/2023, 7:10 AM

## 2023-01-13 LAB — TYPE AND SCREEN
ABO/RH(D): O POS
Antibody Screen: POSITIVE
Donor AG Type: NEGATIVE
Donor AG Type: NEGATIVE
Donor AG Type: NEGATIVE
Donor AG Type: NEGATIVE
Unit division: 0
Unit division: 0
Unit division: 0

## 2023-01-13 LAB — BPAM RBC
Blood Product Expiration Date: 202404302359
Blood Product Expiration Date: 202405102359
Unit Type and Rh: 5100
Unit Type and Rh: 5100
Unit Type and Rh: 5100
Unit Type and Rh: 5100

## 2023-01-14 NOTE — Anesthesia Postprocedure Evaluation (Signed)
Anesthesia Post Note  Patient: Tammy Mcpherson  Procedure(s) Performed: LEFT COMMON FEMORAL ENDARTERECTOMY WITH VEIN PATCH ANGIOPLASTY (Left) TRANSMETATARSAL AMPUTATION LEFT (Left: Toe) VEIN HARVEST LEFT GREATER SAPHENOUS     Patient location during evaluation: PACU Anesthesia Type: General Level of consciousness: sedated and patient cooperative Pain management: pain level controlled Vital Signs Assessment: post-procedure vital signs reviewed and stable Respiratory status: spontaneous breathing Cardiovascular status: stable Anesthetic complications: no   No notable events documented.  Last Vitals:  Vitals:   01/12/23 0723 01/12/23 1116  BP: (!) 140/66 139/65  Pulse: (!) 102 99  Resp: 15 20  Temp: 36.6 C 36.6 C  SpO2: 96% 92%    Last Pain:  Vitals:   01/12/23 1140  TempSrc:   PainSc: 2                  Lewie Loron

## 2023-01-15 ENCOUNTER — Encounter: Payer: Self-pay | Admitting: *Deleted

## 2023-01-15 ENCOUNTER — Telehealth: Payer: Self-pay

## 2023-01-15 NOTE — Transitions of Care (Post Inpatient/ED Visit) (Signed)
   01/15/2023  Name: Tammy Mcpherson MRN: [id number] DOB: [birth year]  Today's TOC FU Call Status: Today's TOC FU Call Status:: Unsuccessul Call (1st Attempt) Unsuccessful Call (1st Attempt) Date: 01/15/23  Attempted to reach the patient regarding the most recent Inpatient/ED visit.  Follow Up Plan: Additional outreach attempts will be made to reach the patient to complete the Transitions of Care (Post Inpatient/ED visit) call.     Antionette Fairy, RN,BSN,CCM [facility] Care Management Care Management Community Coordinator Direct Phone: [phone] Toll Free: [phone] Fax: [phone]

## 2023-01-16 ENCOUNTER — Other Ambulatory Visit: Payer: Self-pay | Admitting: Physician Assistant

## 2023-01-16 ENCOUNTER — Telehealth: Payer: Self-pay

## 2023-01-16 ENCOUNTER — Telehealth: Payer: Self-pay | Admitting: Physician Assistant

## 2023-01-16 DIAGNOSIS — E1140 Type 2 diabetes mellitus with diabetic neuropathy, unspecified: Secondary | ICD-10-CM

## 2023-01-16 MED ORDER — GABAPENTIN 300 MG PO CAPS
300.0000 mg | ORAL_CAPSULE | Freq: Three times a day (TID) | ORAL | 0 refills | Status: AC
Start: 2023-01-16 — End: 2023-04-16

## 2023-01-16 NOTE — Telephone Encounter (Signed)
Contacted Tammy Mcpherson to schedule their annual wellness visit. Appointment made for 01/23/2023.  Gabriel Cirri [facility] AWV TEAM Direct Dial [phone]

## 2023-01-16 NOTE — Telephone Encounter (Signed)
Please see message and advise on prescription Gabapentin. Pt said she is suppose to be taking 3 times a day. Pt would like Rx updated.

## 2023-01-16 NOTE — Transitions of Care (Post Inpatient/ED Visit) (Signed)
   01/16/2023  Name: Tammy Mcpherson MRN: [id number] DOB: 1956/09/01  Today's TOC FU Call Status: Today's TOC FU Call Status:: Unsuccessful Call (2nd Attempt) Unsuccessful Call (2nd Attempt) Date: 01/16/23  Attempted to reach the patient regarding the most recent Inpatient/ED visit.  Follow Up Plan: Additional outreach attempts will be made to reach the patient to complete the Transitions of Care (Post Inpatient/ED visit) call.    Antionette Fairy, RN,BSN,CCM [facility] Care Management Care Management Community Coordinator Direct Phone: [phone] Toll Free: [phone] Fax: [phone]

## 2023-01-16 NOTE — Transitions of Care (Post Inpatient/ED Visit) (Signed)
   01/16/2023  Name: ZIXI FLEISCHAUER MRN: [id number] DOB: Jun 29, 1956  Today's TOC FU Call Status: Today's TOC FU Call Status:: Unsuccessful Call (3rd Attempt) Unsuccessful Call (3rd Attempt) Date: 01/16/23  Attempted to reach the patient regarding the most recent Inpatient/ED visit.  Follow Up Plan: No further outreach attempts will be made at this time. We have been unable to contact the patient.    Antionette Fairy, RN,BSN,CCM [facility] Care Management Care Management Community Coordinator Direct Phone: [phone] Toll Free: [phone] Fax: [phone]

## 2023-01-17 ENCOUNTER — Encounter: Payer: Self-pay | Admitting: Vascular Surgery

## 2023-01-17 ENCOUNTER — Telehealth: Payer: Self-pay | Admitting: *Deleted

## 2023-01-17 ENCOUNTER — Encounter: Payer: Self-pay | Admitting: Internal Medicine

## 2023-01-17 ENCOUNTER — Ambulatory Visit (INDEPENDENT_AMBULATORY_CARE_PROVIDER_SITE_OTHER): Payer: PPO | Admitting: Internal Medicine

## 2023-01-17 ENCOUNTER — Telehealth: Payer: Self-pay | Admitting: Physician Assistant

## 2023-01-17 VITALS — BP 106/60 | HR 103 | Temp 98.0°F | Ht 65.0 in

## 2023-01-17 DIAGNOSIS — Z9229 Personal history of other drug therapy: Secondary | ICD-10-CM | POA: Diagnosis not present

## 2023-01-17 DIAGNOSIS — R310 Gross hematuria: Secondary | ICD-10-CM

## 2023-01-17 DIAGNOSIS — R300 Dysuria: Secondary | ICD-10-CM

## 2023-01-17 DIAGNOSIS — R319 Hematuria, unspecified: Secondary | ICD-10-CM

## 2023-01-17 DIAGNOSIS — N3001 Acute cystitis with hematuria: Secondary | ICD-10-CM

## 2023-01-17 LAB — POCT URINALYSIS DIPSTICK
Glucose, UA: NEGATIVE
Nitrite, UA: POSITIVE
Protein, UA: POSITIVE — AB
Spec Grav, UA: 1.015 (ref 1.010–1.025)
Urobilinogen, UA: 2 E.U./dL — AB
pH, UA: 5.5 (ref 5.0–8.0)

## 2023-01-17 MED ORDER — CIPROFLOXACIN HCL 500 MG PO TABS
500.0000 mg | ORAL_TABLET | Freq: Two times a day (BID) | ORAL | 0 refills | Status: AC
Start: 2023-01-17 — End: 2023-01-27

## 2023-01-17 NOTE — Telephone Encounter (Signed)
FYI, see Triage note. 

## 2023-01-17 NOTE — Telephone Encounter (Signed)
Spoke to Tammy Mcpherson she said pt is status post op of amputation of foot and Endarterectomy. Pt is having hematura, bright red blood and passing dime size clots and is on blood thinners needs to be seen today. Told her Lelon Mast schedule is full today. Did they not offer an appt with another provider? Debbie said no. Told her will transfer back to front to get schedule. Debbie verbalized understanding.

## 2023-01-17 NOTE — Telephone Encounter (Signed)
Patient Name: Tammy Mcpherson [facility] Gender: Female DOB: Mar 20, 1956 Age: 67 Y 4 M 18 D Return Phone Number: [phone] (Primary), [phone] (Secondary) Address: City/ State/ Zip: [place] [address] Client [facility] [employer] education [employer] [facility] Provider Bufford Buttner, [place]- [place] Contact Type Call Who Is Calling Patient / Member / Family / Caregiver Call Type Triage / Clinical Caller Name Jocey Brackenridge Relationship To Patient Spouse Return Phone Number [phone] (Primary) Chief Complaint Urine, Blood In Reason for Call Symptomatic / Request for Health Information Initial Comment Caller states his wife recently had an angioplasty and a partial amputation of her foot. This morning she woke up and had blood in her urine. GOTO Facility Not Listed Area UC Translation No Nurse Assessment Nurse: Clarita Leber, RN, Gavin Pound Date/Time (Eastern Time): 01/17/2023 10:51:56 AM Confirm and document reason for call. If symptomatic, describe symptoms. ---The caller states that his wife had an angioplasty on the 11th and partial amputation of the foot at the same time. At 2 am she started having hematuria. Is on Plavix and baby ASA. Does the patient have any new or worsening symptoms? ---Yes Will a triage be completed? ---Yes Related visit to physician within the last 2 weeks? ---Yes Does the PT have any chronic conditions? (i.e. diabetes, asthma, this includes High risk factors for pregnancy, etc.) ---Yes List chronic conditions. ---COPD, diabetes, and heart disease Angioplasty 4/11 Is this a behavioral health or substance abuse call? ---No Guidelines Guideline Title Affirmed Question Affirmed Notes Nurse Date/Time (Eastern Time) Urine - Blood In Taking Coumadin (warfarin) or other strong blood thinner, or known bleeding Womble, RN, Gavin Pound 01/17/2023 10:54:15 AM PLEASE NOTE: All timestamps contained within this report are  represented as [place] Standard Time. CONFIDENTIALTY NOTICE: This fax transmission is intended only for the addressee. It contains information that is legally privileged, confidential or otherwise protected from use or disclosure. If you are not the intended recipient, you are strictly prohibited from reviewing, disclosing, copying using or disseminating any of this information or taking any action in reliance on or regarding this information. If you have received this fax in error, please notify us immediately by telephone so that we can arrange for its return to [place]. Phone: [phone], Toll-Free: [phone], Fax: [phone] Page: 2 of 2 Call Id: [id number] Guidelines Guideline Title Affirmed Question Affirmed Notes Nurse Date/Time Lamount Cohen Time) disorder (e.g., thrombocytopenia) Disp. Time Lamount Cohen Time) Disposition Final User 01/17/2023 11:13:36 AM See HCP within 4 Hours (or PCP triage) Yes Clarita Leber, RN, Gavin Pound Final Disposition 01/17/2023 11:13:36 AM See HCP within 4 Hours (or PCP triage) Yes Clarita Leber, RN, Jetty Duhamel Disagree/Comply Comply Caller Understands Yes PreDisposition Call Doctor Care Advice Given Per Guideline SEE HCP (OR PCP TRIAGE) WITHIN 4 HOURS: Comments User: Alita Chyle, RN Date/Time Lamount Cohen Time): 01/17/2023 11:08:35 AM This nurse followed the directives and called the backline. The first time there was no answer. The second time this nurse reached staff and waited several minutes. The provider's nurse states that the provider has not openings. Then we got disconnected. User: Alita Chyle, RN Date/Time Lamount Cohen Time): 01/17/2023 11:16:34 AM This nurse spoke with third person at [facility] and she looked and saw that there was no availability to see the caller. This nurse asked the caller if he had contacted his wife's surgeon and he states that he had and the surgeon wanted to run out a UTI and this nurse advised him to schedule an arrival time at a local  UC. This nurse also informed him that it would be good if he knew the fax number to the surgeon's office so that they could send results to the surgeon and also that he can call back if he has any further needs and he verbalized understanding. Referrals GO TO FACILITY OTHER - SPECIFY

## 2023-01-17 NOTE — Telephone Encounter (Signed)
Pt called and states she is was urinated blood this morning. She states she is on the way to PCP office and would call back with an update. States she may need to come in the office to be seen.

## 2023-01-17 NOTE — Telephone Encounter (Signed)
Access nurse Eunice Blase called back wanting to speak with clinical re blood thinners and or for patient to be seen today.   Called transferred to [facility].

## 2023-01-17 NOTE — Progress Notes (Signed)
[facility]: [phone]   Routine Medical Office Visit  Patient:  Tammy Mcpherson      Age: 67 y.o.       Sex:  female  Date:   01/17/2023 PCP:    Tammy Motto, PA   Today's Healthcare Provider: Lula Olszewski, MD   Assessment and Plan:   Gross hematuria -     POCT urinalysis dipstick -     Urine Culture; Future -     CBC; Future -     Iron, TIBC and Ferritin Panel; Future -     Comprehensive metabolic panel; Future -     Urine Microscopic -     Ciprofloxacin HCl; Take 1 tablet (500 mg total) by mouth 2 (two) times daily for 10 days.  Dispense: 20 tablet; Refill: 0  Acute cystitis with hematuria -     POCT urinalysis dipstick -     Urine Culture; Future -     CBC; Future -     Iron, TIBC and Ferritin Panel; Future -     Comprehensive metabolic panel; Future -     Urine Microscopic -     Ciprofloxacin HCl; Take 1 tablet (500 mg total) by mouth 2 (two) times daily for 10 days.  Dispense: 20 tablet; Refill: 0  Hx of long term use of blood thinners   I considered artificial intelligence input: Considering the patient's complex medical history and current medications, if her renal function is adequate, Nitrofurantoin 100 mg twice daily for 5 days would be an appropriate choice for empiric treatment of her UTI, due to its effectiveness and safety profile in treating uncomplicated UTIs  . If there is concern about her renal function or potential for drug interactions, Fosfomycin trometamol 3g as a single dose could be considered as an alternative, offering a broad spectrum of activity and convenience of a single-dose regimen. It is imperative to review her renal function before initiating therapy and to adjust the treatment based on culture results and antibiotic sensitivity.   However, I wanted a stronger more broad spectrum antibiotic(s) for heavy bleeding not able to stop aspirin/Plavix... To hopefully prevent emergency room visit, so I prescribed cipro  after discussion of risks and benefits   We discussed risks and benefits of continued aspirin/Plavix in setting of acute heavy hematuria, and collaboratively agreed to continue aspirin and Plavix with the understanding that bleeding risks seemed less than clot restenosis risks  I gave very extensive instruction(s) on how to manage symptom(s) and when to go to emergency room:  Situations that should prompt an emergency room visit include: Signs of sepsis, such as fever, chills, rapid heart rate, or confusion. Inability to urinate or severe pain, indicating potential urinary retention or severe infection. Significant bleeding or clots in the urine causing obstruction. Regarding your medications, aspirin and Plavix (clopidogrel) are anticoagulants that increase the risk of bleeding. Given your severe bloody urine, we did a review of the risk-benefit ratio of continuing these medications, especially in the context of her UTI and potential for urinary tract trauma in the context of recent stenting, and we agreed the risks of bleeding are less than the risks of stent restenosis with discontinuing these medications  Therefore you also need to go to emergency room if you become anemic: Now that you have been diagnosed with worsening anemia, it's important to be aware of the following symptoms that may indicate a need for immediate medical attention:  Extreme fatigue or weakness Shortness  of breath Rapid or irregular heartbeat Chest pain Dizziness or lightheadedness Pale or yellowish skin Cold hands and feet Headaches  Emergency symptoms specific to anemia include: A feeling that you're going to pass out Low blood pressure Low blood oxygen saturation, which may be measured with a pulse oximeter at home3 Loss of consciousness Please advise the patient to seek emergency care if they experience any of these symptoms, or any other symptoms that cause concern.  It's always better to be safe and get  checked out by a healthcare professional if you are unsure.  When to recheck the anemia depends on how you are feeling... but if you feel fine and dont see any significant bleeding, then I'd suggest doing it in about a month.  In the meantime be sure to consistently check toilet to assess your bleeding and discuss whether to take blood thinners with me.   [x]    I always recommend close follow up (within 1-2 weeks) with your Primary Care Provider (PCP) for any acute problems.  If you are not doing well:  Return to the office sooner.  If your condition begins worsening or become severe or you can't get a sooner appointment :  go to the emergency room.  Medical Decision Making: 1 or more chronic illnesses with exacerbation,  progression, or side effects of treatment 1 undiagnosed new problem with uncertain prognosis 1 acute complicated injury     Ordering of each unique test; Prescription drug management       Clinical Presentation:   67 y.o. female here today for Hematuria (Started this morning.) and Burning with urination  Patient complains of sudden onset of severe bloody urine for the last 12 hours.  Has been having some urethral itching that feels like a prior urinary tract infection and she has had a wick urinary catheter all week since she had angioplasty left femoral insertion done 6 days ago but she seems to have it according to today's urinalysis.  Only lower urinary tract symptoms are itching but this is similar to prior urinary tract infection (UTI) for her.   Nitrite, UA Positive   Leukocytes, UA Large (3+) (A) Negative  Appearance    Odor    She does not know if a Foley was inserted during the angioplasty but it is suspected to happen.  She does not have any pelvic pain or burning with urination she does not have any pain over the kidneys she is producing lots of urine output and not feeling like she is having difficulty peeing she is drinking lots of water.  No antibiotic  alls   Reviewed:  Active Ambulatory Problems    Diagnosis Date Noted   Acute combined systolic and diastolic heart failure 04/30/2016   Chronic obstructive pulmonary disease 04/30/2016   Essential hypertension 04/30/2016   Tobacco abuse 04/30/2016   Nonischemic cardiomyopathy 05/02/2016   CAD-Ca++ coronaries on CTA 05/02/2016   Anemia, iron deficiency 05/12/2016   Aortic atherosclerosis 01/01/2017   Gastric ulcer 01/01/2017   Hyperlipidemia 01/01/2017   Dyspnea on effort 01/01/2017   Vitamin D deficiency 01/15/2017   PAD (peripheral artery disease) 01/15/2017   Gastric AVM    AVM (arteriovenous malformation) of small bowel, acquired    Lesion of liver    Diarrhea 02/07/2018   Prediabetes 03/01/2018   Need for hepatitis C screening test 11/20/2018   Neuropathy due to type 2 diabetes mellitus 04/22/2019   Ganglion cyst of volar aspect of right wrist 05/23/2019   Antiplatelet or  antithrombotic long-term use 11/03/2019   Type 2 diabetes mellitus 11/12/2019   Type 2 diabetes mellitus with hyperglycemia, without long-term current use of insulin 12/23/2019   Diabetes mellitus 12/23/2019   Neutrophilia 03/23/2020   Thrombocytosis 03/23/2020   Vitamin B12 deficiency 03/23/2020   Abdominal pain 05/12/2016   LLQ abdominal pain 05/12/2016   Adrenal hyperplasia 08/18/2020   Emphysema of lung 02/21/2021   Lower limb ischemia 05/17/2021   GERD (gastroesophageal reflux disease) 09/13/2021   GAD (generalized anxiety disorder) 02/09/2022   Peptic ulcer disease 02/09/2022   Upper GI bleed 10/26/2022   Severe anemia 10/26/2022   Acute blood loss anemia 10/26/2022   Critical limb ischemia of left lower extremity with ulceration of lower leg 12/14/2022   Atherosclerosis 01/11/2023   Critical limb ischemia of left lower extremity 01/11/2023   Resolved Ambulatory Problems    Diagnosis Date Noted   Acute respiratory failure with hypoxia 04/30/2016   Anemia 04/30/2016   Elevated troponin  05/02/2016   Calculus of gallbladder without cholecystitis without obstruction 06/22/2016   Gallbladder mass    Breast cancer metastasized to axillary lymph node, left 04/09/2018   Past Medical History:  Diagnosis Date   Anxiety    Arthritis    Cardiomyopathy    COPD (chronic obstructive pulmonary disease)    Coronary artery calcification seen on CT scan    Critical limb ischemia of both lower extremities    Headache    History of bronchitis    History of GI bleed    History of hiatal hernia    Iron deficiency anemia    Peripheral vascular disease    Pollen allergy    PSVT (paroxysmal supraventricular tachycardia)    PVC's (premature ventricular contractions)     Outpatient Medications Prior to Visit  Medication Sig   Acetaminophen 500 MG capsule Take 500-1,000 mg by mouth every 6 (six) hours as needed for pain (or headaches).   albuterol (PROVENTIL) (2.5 MG/3ML) 0.083% nebulizer solution Take 3 mLs (2.5 mg total) by nebulization every 4 (four) hours as needed for wheezing or shortness of breath.   albuterol (VENTOLIN HFA) 108 (90 Base) MCG/ACT inhaler INHALE TWO PUFFS INTO THE LUNGS EVERY SIX HOURS AS NEEDED FOR WHEEZING OR SHORTNESS OF BREATH   ALPRAZolam (XANAX) 0.5 MG tablet TAKE ONE TABLET (0.5MG  TOTAL) BY MOUTH DAILY AS NEEDED FOR ANXIETY   aspirin 81 MG chewable tablet Chew 1 tablet (81 mg total) by mouth every morning.   atorvastatin (LIPITOR) 80 MG tablet Take 1 tablet (80 mg total) by mouth daily. (Patient taking differently: Take 80 mg by mouth at bedtime.)   blood glucose meter kit and supplies Dispense based on patient and insurance preference. Use up to four times daily as directed. (FOR ICD-10 E10.9, E11.9).   Cholecalciferol (VITAMIN D) 50 MCG (2000 UT) CAPS Take 1 capsule (2,000 Units total) by mouth daily.   clopidogrel (PLAVIX) 75 MG tablet Take 1 tablet (75 mg total) by mouth daily. TAKE ONE TABLET (75MG  TOTAL) BY MOUTH DAILY WITH BREAKFAST   Continuous Blood  Gluc Receiver (FREESTYLE LIBRE 2 READER) DEVI Check blood glucose up to 4 times daily   Continuous Blood Gluc Sensor (FREESTYLE LIBRE 2 SENSOR) MISC Apply one sensor to the upper arm avery 14 days. (Patient taking differently: Inject 1 patch into the skin See admin instructions. Apply one sensor into the upper arm every 14 days)   cyanocobalamin (VITAMIN B12) 1000 MCG/ML injection INJECT 1 ML INTO THE MUSCLE EVERY 30 DAYS. (  Patient taking differently: Inject 1,000 mcg into the skin every 30 (thirty) days.)   cyclobenzaprine (FLEXERIL) 5 MG tablet Take 1 tablet (5 mg total) by mouth 3 (three) times daily as needed for muscle spasms.   dicyclomine (BENTYL) 10 MG capsule Take 1 capsule (10 mg total) by mouth 2 (two) times daily as needed for spasms.   FLUoxetine (PROZAC) 40 MG capsule Take 1 capsule (40 mg total) by mouth daily.   gabapentin (NEURONTIN) 300 MG capsule Take 1 capsule (300 mg total) by mouth 3 (three) times daily.   glucose blood (ONETOUCH ULTRA) test strip Use as instructed   glucose monitoring kit (FREESTYLE) monitoring kit Use to check blood sugar BID as directed   Lancets (ONETOUCH DELICA PLUS LANCET33G) MISC 1 each by Does not apply route 2 (two) times a day.   metFORMIN (GLUCOPHAGE-XR) 500 MG 24 hr tablet Take 1 tablet (500 mg total) by mouth daily with breakfast. (Patient taking differently: Take 500 mg by mouth daily as needed (high blood sugar).)   Misc. Devices MISC Please provide supplies (needle, syringe, alcohol swabs) needed for patient to self-administer B-12 injections monthly.   oxyCODONE-acetaminophen (PERCOCET/ROXICET) 5-325 MG tablet Take 1 tablet by mouth every 6 (six) hours as needed for moderate pain.   pantoprazole (PROTONIX) 40 MG tablet TAKE ONE (1) TABLET 30 MINS BEFORE YOUR FIRST MEAL.   sacubitril-valsartan (ENTRESTO) 49-51 MG Take 1 tablet by mouth 2 (two) times daily.   traMADol (ULTRAM) 50 MG tablet Take 1 tablet (50 mg total) by mouth every 6 (six) hours as  needed.   Tuberculin-Allergy Syringes (ULTICARE TUBERCULIN SAFETY SYR) 25G X 1" 1 ML MISC USE TO ADMINISTER INJECTABLE VITAMIN B12.   iron polysaccharides (NIFEREX) 150 MG capsule Take 1 capsule (150 mg total) by mouth daily.   No facility-administered medications prior to visit.    HPI  Updated and modified:  No problems updated.           Clinical Data Analysis:   Physical Exam  BP 106/60 (BP Location: Right Arm, Patient Position: Sitting)   Pulse (!) 103   Temp 98 F (36.7 C) (Temporal)   Ht 5\' 5"  (1.651 m)   SpO2 96%   BMI 20.91 kg/m  Wt Readings from Last 10 Encounters:  01/11/23 125 lb 10.6 oz (57 kg)  01/02/23 124 lb (56.2 kg)  12/17/22 124 lb 9 oz (56.5 kg)  12/13/22 134 lb (60.8 kg)  12/09/22 132 lb 15 oz (60.3 kg)  12/05/22 134 lb (60.8 kg)  11/06/22 133 lb 15.9 oz (60.8 kg)  11/06/22 134 lb (60.8 kg)  10/27/22 144 lb 13.5 oz (65.7 kg)  10/25/22 136 lb 8 oz (61.9 kg)   Vital signs reviewed.  Nursing notes reviewed. Weight trend reviewed. Abnormalities and Problem-Specific physical exam findings:  in wheelchair   General Appearance:  No acute distress appreciable.   Well-groomed, healthy-appearing female.  Well proportioned with no abnormal fat distribution.  Good muscle tone. Skin: Clear and well-hydrated. Pulmonary:  Normal work of breathing at rest, no respiratory distress apparent. SpO2: 96 %  Neurological:  Awake, alert, oriented, and engaged.  No obvious focal neurological deficits or cognitive impairments.  Sensorium seems unclouded. Gait is smooth and coordinated.  Speech is clear and coherent with logical content. Psychiatric:  Appropriate mood, pleasant and cooperative demeanor, cheerful and engaged during the exam   Additional Results Reviewed:     Results for orders placed or performed in visit on 01/17/23  Urine Microscopic  Result Value Ref Range   WBC, UA 11-20/hpf (A) 0-2/hpf   RBC / HPF TNTC(>50/hpf) (A) 0-2/hpf  POCT Urinalysis  Dipstick  Result Value Ref Range   Color, UA dark red    Clarity, UA clear    Glucose, UA Negative Negative   Bilirubin, UA 1+    Ketones, UA 1+    Spec Grav, UA 1.015 1.010 - 1.025   Blood, UA 3+    pH, UA 5.5 5.0 - 8.0   Protein, UA Positive (A) Negative   Urobilinogen, UA 2.0 (A) 0.2 or 1.0 E.U./dL   Nitrite, UA Positive    Leukocytes, UA Large (3+) (A) Negative   Appearance     Odor      Recent Results (from the past 2160 hour(s))  CBC with Differential/Platelet     Status: Abnormal   Collection Time: 10/25/22  2:00 PM  Result Value Ref Range   WBC 22.1 Repeated and verified X2. (HH) 4.0 - 10.5 K/uL   RBC 2.14 (L) 3.87 - 5.11 Mil/uL   Hemoglobin 5.3 Repeated and verified X2. (LL) 12.0 - 15.0 g/dL   HCT 16.1 Repeated and verified X2. (LL) 36.0 - 46.0 %   MCV 82.1 78.0 - 100.0 fl   MCHC 30.0 30.0 - 36.0 g/dL   RDW 09.6 (H) 04.5 - 40.9 %   Platelets 634.0 (H) 150.0 - 400.0 K/uL   Neutrophils Relative % 82.6 (H) 43.0 - 77.0 %   Lymphocytes Relative 6.4 (L) 12.0 - 46.0 %   Monocytes Relative 10.6 3.0 - 12.0 %   Eosinophils Relative 0.1 0.0 - 5.0 %   Basophils Relative 0.3 0.0 - 3.0 %   Neutro Abs 18.3 (H) 1.4 - 7.7 K/uL   Lymphs Abs 1.4 0.7 - 4.0 K/uL   Monocytes Absolute 2.3 (H) 0.1 - 1.0 K/uL   Eosinophils Absolute 0.0 0.0 - 0.7 K/uL   Basophils Absolute 0.1 0.0 - 0.1 K/uL  Comprehensive metabolic panel     Status: Abnormal   Collection Time: 10/25/22  2:00 PM  Result Value Ref Range   Sodium 136 135 - 145 mEq/L   Potassium 4.0 3.5 - 5.1 mEq/L   Chloride 99 96 - 112 mEq/L   CO2 24 19 - 32 mEq/L   Glucose, Bld 194 (H) 70 - 99 mg/dL   BUN 13 6 - 23 mg/dL   Creatinine, Ser 8.11 0.40 - 1.20 mg/dL   Total Bilirubin 0.2 0.2 - 1.2 mg/dL   Alkaline Phosphatase 95 39 - 117 U/L   AST 8 0 - 37 U/L   ALT 7 0 - 35 U/L   Total Protein 6.5 6.0 - 8.3 g/dL   Albumin 3.7 3.5 - 5.2 g/dL   GFR 91.47 >82.95 mL/min    Comment: Calculated using the CKD-EPI Creatinine Equation (2021)    Calcium 8.8 8.4 - 10.5 mg/dL  Lipid panel     Status: Abnormal   Collection Time: 10/25/22  2:00 PM  Result Value Ref Range   Cholesterol 125 0 - 200 mg/dL    Comment: ATP III Classification       Desirable:  < 200 mg/dL               Borderline High:  200 - 239 mg/dL          High:  > = 621 mg/dL   Triglycerides 308.6 (H) 0.0 - 149.0 mg/dL    Comment: Normal:  <578 mg/dLBorderline High:  150 - 199 mg/dL  HDL 26.80 (L) >39.00 mg/dL   VLDL 16.1 (H) 0.0 - 09.6 mg/dL   Total CHOL/HDL Ratio 5     Comment:                Men          Women1/2 Average Risk     3.4          3.3Average Risk          5.0          4.42X Average Risk          9.6          7.13X Average Risk          15.0          11.0                       NonHDL 97.74     Comment: NOTE:  Non-HDL goal should be 30 mg/dL higher than patient's LDL goal (i.e. LDL goal of < 70 mg/dL, would have non-HDL goal of < 100 mg/dL)  Hemoglobin E4V     Status: Abnormal   Collection Time: 10/25/22  2:00 PM  Result Value Ref Range   Hgb A1c MFr Bld 7.3 (H) 4.6 - 6.5 %    Comment: Glycemic Control Guidelines for People with Diabetes:Non Diabetic:  <6%Goal of Therapy: <7%Additional Action Suggested:  >8%   LDL cholesterol, direct     Status: None   Collection Time: 10/25/22  2:00 PM  Result Value Ref Range   Direct LDL 64.0 mg/dL    Comment: Optimal:  <409 mg/dLNear or Above Optimal:  100-129 mg/dLBorderline High:  130-159 mg/dLHigh:  160-189 mg/dLVery High:  >190 mg/dL  CBC with Differential     Status: Abnormal   Collection Time: 10/26/22 12:50 PM  Result Value Ref Range   WBC 19.8 (H) 4.0 - 10.5 K/uL   RBC 2.23 (L) 3.87 - 5.11 MIL/uL   Hemoglobin 5.4 (LL) 12.0 - 15.0 g/dL    Comment: REPEATED TO VERIFY THIS CRITICAL RESULT HAS VERIFIED AND BEEN CALLED TO A BRADSHAW RN BY KIRSTENE FORSYTH ON 01 25 2024 AT 1344, AND HAS BEEN READ BACK.  CORRECTED ON 01/25 AT 1500: PREVIOUSLY REPORTED AS 5.4 REPEATED TO VERIFY THIS CRITICAL RESULT HAS  VERIFIED AND BEEN CALLED TO A BRADSHAW RN BY KIRSTENE FORSYTH ON 01 25 2024 AT 1344, AND HAS BEEN READ BACK.  THIS CRITICAL RESULT HAS VERIFIED AND BEEN  CALLED TO D BRADSHAW BY KIRSTENE FORSYTH ON 01 25 2024 AT 1346, AND HAS BEEN READ BACK.     HCT 18.8 (L) 36.0 - 46.0 %   MCV 84.3 80.0 - 100.0 fL   MCH 24.2 (L) 26.0 - 34.0 pg   MCHC 28.7 (L) 30.0 - 36.0 g/dL   RDW 81.1 (H) 91.4 - 78.2 %   Platelets 639 (H) 150 - 400 K/uL   nRBC 0.3 (H) 0.0 - 0.2 %   Neutrophils Relative % 78 %   Neutro Abs 15.5 (H) 1.7 - 7.7 K/uL   Lymphocytes Relative 10 %   Lymphs Abs 1.9 0.7 - 4.0 K/uL   Monocytes Relative 10 %   Monocytes Absolute 2.0 (H) 0.1 - 1.0 K/uL   Eosinophils Relative 1 %   Eosinophils Absolute 0.1 0.0 - 0.5 K/uL   Basophils Relative 0 %   Basophils Absolute 0.1 0.0 - 0.1 K/uL   Immature Granulocytes 1 %   Abs  Immature Granulocytes 0.20 (H) 0.00 - 0.07 K/uL    Comment: Performed at [facility], [address], [place], [place] [address]  Comprehensive metabolic panel     Status: Abnormal   Collection Time: 10/26/22 12:50 PM  Result Value Ref Range   Sodium 134 (L) 135 - 145 mmol/L   Potassium 3.0 (L) 3.5 - 5.1 mmol/L   Chloride 99 98 - 111 mmol/L   CO2 23 22 - 32 mmol/L   Glucose, Bld 197 (H) 70 - 99 mg/dL    Comment: Glucose reference range applies only to samples taken after fasting for at least 8 hours.   BUN 11 8 - 23 mg/dL   Creatinine, Ser 4.69 0.44 - 1.00 mg/dL   Calcium 8.5 (L) 8.9 - 10.3 mg/dL   Total Protein 6.9 6.5 - 8.1 g/dL   Albumin 3.2 (L) 3.5 - 5.0 g/dL   AST 15 15 - 41 U/L   ALT 11 0 - 44 U/L   Alkaline Phosphatase 97 38 - 126 U/L   Total Bilirubin 0.4 0.3 - 1.2 mg/dL   GFR, Estimated >62 >95 mL/min    Comment: (NOTE) Calculated using the CKD-EPI Creatinine Equation (2021)    Anion gap 12 5 - 15    Comment: Performed at [facility], [address] [place], [place] [address]  Vitamin B12     Status: None   Collection Time: 10/26/22 12:50 PM  Result Value  Ref Range   Vitamin B-12 469 180 - 914 pg/mL    Comment: (NOTE) This assay is not validated for testing neonatal or myeloproliferative syndrome specimens for Vitamin B12 levels. Performed at [facility], [address] [place], [place] [address]   Folate     Status: None   Collection Time: 10/26/22 12:50 PM  Result Value Ref Range   Folate 7.7 >5.9 ng/mL    Comment: Performed at [facility], [address], [place], [place] [address]  Iron and TIBC     Status: Abnormal   Collection Time: 10/26/22 12:50 PM  Result Value Ref Range   Iron 12 (L) 28 - 170 ug/dL   TIBC 366 (H) 440 - 347 ug/dL   Saturation Ratios 2 (L) 10.4 - 31.8 %   UIBC 531 ug/dL    Comment: Performed at [facility], [address] [place], [place] [address]  Ferritin     Status: Abnormal   Collection Time: 10/26/22 12:50 PM  Result Value Ref Range   Ferritin 8 (L) 11 - 307 ng/mL    Comment: Performed at [facility], [address] [place], [place] [address]  Reticulocytes     Status: Abnormal   Collection Time: 10/26/22 12:50 PM  Result Value Ref Range   Retic Ct Pct 3.0 0.4 - 3.1 %    Comment: RESULTS CONFIRMED BY MANUAL DILUTION   RBC. 2.20 (L) 3.87 - 5.11 MIL/uL   Retic Count, Absolute 67.0 19.0 - 186.0 K/uL   Immature Retic Fract 16.0 (H) 2.3 - 15.9 %    Comment: Performed at [facility], [address], [place], [place] [address]  Type and screen [facility]     Status: None   Collection Time: 10/26/22  1:01 PM  Result Value Ref Range   ABO/RH(D) O POS    Antibody Screen POS    Sample Expiration 10/29/2022,2359    Antibody Identification ANTI FYA (Duffy a)    PT AG Type NEGATIVE FOR DUFFY A ANTIGEN    Unit Number  [id number]    Blood Component Type RED CELLS,LR    Unit division 00    Status of Unit ISSUED,FINAL    Donor AG Type NEGATIVE FOR DUFFY A ANTIGEN    Transfusion Status OK TO TRANSFUSE    Crossmatch Result COMPATIBLE    Unit Number [id number]    Blood Component Type RED  CELLS,LR    Unit division 00    Status of Unit ISSUED,FINAL    Donor AG Type NEGATIVE FOR DUFFY A ANTIGEN    Transfusion Status OK TO TRANSFUSE    Crossmatch Result COMPATIBLE    Unit Number [id number]    Blood Component Type RED CELLS,LR    Unit division 00    Status of Unit REL FROM [facility]    Transfusion Status OK TO TRANSFUSE    Crossmatch Result      COMPATIBLE Performed at [facility], [address], [place], [place] [address]    Unit Number [id number]    Blood Component Type RED CELLS,LR    Unit division 00    Status of Unit REL FROM [facility]    Transfusion Status OK TO TRANSFUSE    Crossmatch Result COMPATIBLE   BPAM RBC     Status: None   Collection Time: 10/26/22  1:01 PM  Result Value Ref Range   ISSUE DATE / TIME 016010932355    Blood Product Unit Number [id number]    PRODUCT CODE [id number]    Unit Type and Rh 5100    Blood Product Expiration Date 176160737106    ISSUE DATE / TIME [id number]    Blood Product Unit Number [id number]    PRODUCT CODE [id number]    Unit Type and Rh 5100    Blood Product Expiration Date 810175102585    Blood Product Unit Number [id number]    Unit Type and Rh 5100    Blood Product Expiration Date 431540086761    Blood Product Unit Number [id number]    Unit Type and Rh 5100    Blood Product Expiration Date 099833825053   Prepare RBC (crossmatch)     Status: None   Collection Time: 10/26/22  1:01 PM  Result Value Ref Range   Order Confirmation      ORDER PROCESSED BY BLOOD BANK Performed at [facility], [address], [place], [place] [address]   MRSA Next Gen by PCR, Nasal     Status: None   Collection Time: 10/26/22  1:48 PM   Specimen: Nasal Mucosa; Nasal Swab  Result Value Ref Range   MRSA by PCR Next Gen NOT DETECTED NOT DETECTED    Comment: (NOTE) The GeneXpert MRSA Assay (FDA approved for NASAL specimens only), is one component of a comprehensive MRSA colonization surveillance program. It is not intended  to diagnose MRSA infection nor to guide or monitor treatment for MRSA infections. Test performance is not FDA approved in patients less than 9 years old. Performed at [facility], [address], [place], [place] [address]   Prepare RBC (crossmatch)     Status: None   Collection Time: 10/26/22  1:55 PM  Result Value Ref Range   Order Confirmation      ORDER PROCESSED BY BLOOD BANK Performed at [facility], [address] [place], [place] [address]   Urinalysis, Routine w reflex microscopic -Urine, Clean Catch     Status: Abnormal   Collection Time: 10/26/22  3:40 PM  Result Value Ref Range   Color, Urine STRAW (A) YELLOW   APPearance CLEAR CLEAR  Specific Gravity, Urine 1.004 (L) 1.005 - 1.030   pH 6.0 5.0 - 8.0   Glucose, UA NEGATIVE NEGATIVE mg/dL   Hgb urine dipstick SMALL (A) NEGATIVE   Bilirubin Urine NEGATIVE NEGATIVE   Ketones, ur NEGATIVE NEGATIVE mg/dL   Protein, ur NEGATIVE NEGATIVE mg/dL   Nitrite NEGATIVE NEGATIVE   Leukocytes,Ua TRACE (A) NEGATIVE   RBC / HPF 0-5 0 - 5 RBC/hpf   WBC, UA 0-5 0 - 5 WBC/hpf   Bacteria, UA NONE SEEN NONE SEEN   Squamous Epithelial / HPF 0-5 0 - 5 /HPF    Comment: Performed at [facility], [address], [place], [place] [address]  Glucose, capillary     Status: Abnormal   Collection Time: 10/26/22  5:25 PM  Result Value Ref Range   Glucose-Capillary 122 (H) 70 - 99 mg/dL    Comment: Glucose reference range applies only to samples taken after fasting for at least 8 hours.  Glucose, capillary     Status: Abnormal   Collection Time: 10/26/22  7:41 PM  Result Value Ref Range   Glucose-Capillary 118 (H) 70 - 99 mg/dL    Comment: Glucose reference range applies only to samples taken after fasting for at least 8 hours.  Glucose, capillary     Status: Abnormal   Collection Time: 10/26/22 11:48 PM  Result Value Ref Range   Glucose-Capillary 128 (H) 70 - 99 mg/dL    Comment: Glucose reference range applies only to samples taken after  fasting for at least 8 hours.  Glucose, capillary     Status: Abnormal   Collection Time: 10/27/22  3:34 AM  Result Value Ref Range   Glucose-Capillary 122 (H) 70 - 99 mg/dL    Comment: Glucose reference range applies only to samples taken after fasting for at least 8 hours.  HIV Antibody (routine testing w rflx)     Status: None   Collection Time: 10/27/22  7:09 AM  Result Value Ref Range   HIV Screen 4th Generation wRfx Non Reactive Non Reactive    Comment: Performed at [facility], [address] [place], [place] [address]  CBC     Status: Abnormal   Collection Time: 10/27/22  7:09 AM  Result Value Ref Range   WBC 21.7 (H) 4.0 - 10.5 K/uL   RBC 3.24 (L) 3.87 - 5.11 MIL/uL   Hemoglobin 8.9 (L) 12.0 - 15.0 g/dL    Comment: REPEATED TO VERIFY POST TRANSFUSION SPECIMEN    HCT 28.4 (L) 36.0 - 46.0 %   MCV 87.7 80.0 - 100.0 fL   MCH 27.5 26.0 - 34.0 pg   MCHC 31.3 30.0 - 36.0 g/dL   RDW 78.2 (H) 95.6 - 21.3 %   Platelets 477 (H) 150 - 400 K/uL   nRBC 0.1 0.0 - 0.2 %    Comment: Performed at [facility], [address], [place], [place] [address]  Comprehensive metabolic panel     Status: Abnormal   Collection Time: 10/27/22  7:09 AM  Result Value Ref Range   Sodium 138 135 - 145 mmol/L   Potassium 4.1 3.5 - 5.1 mmol/L    Comment: DELTA CHECK NOTED   Chloride 106 98 - 111 mmol/L   CO2 25 22 - 32 mmol/L   Glucose, Bld 135 (H) 70 - 99 mg/dL    Comment: Glucose reference range applies only to samples taken after fasting for at least 8 hours.   BUN 8 8 - 23 mg/dL  Creatinine, Ser 0.58 0.44 - 1.00 mg/dL   Calcium 7.5 (L) 8.9 - 10.3 mg/dL   Total Protein 6.4 (L) 6.5 - 8.1 g/dL   Albumin 2.9 (L) 3.5 - 5.0 g/dL   AST 11 (L) 15 - 41 U/L   ALT 11 0 - 44 U/L   Alkaline Phosphatase 93 38 - 126 U/L   Total Bilirubin 0.6 0.3 - 1.2 mg/dL   GFR, Estimated >16 >10 mL/min    Comment: (NOTE) Calculated using the CKD-EPI Creatinine Equation (2021)    Anion gap 7 5 - 15     Comment: Performed at [facility], [address], [place], [place] [address]  Magnesium     Status: None   Collection Time: 10/27/22  7:09 AM  Result Value Ref Range   Magnesium 1.9 1.7 - 2.4 mg/dL    Comment: Performed at [facility], [address], [place], [place] [address]  Glucose, capillary     Status: Abnormal   Collection Time: 10/27/22  7:36 AM  Result Value Ref Range   Glucose-Capillary 136 (H) 70 - 99 mg/dL    Comment: Glucose reference range applies only to samples taken after fasting for at least 8 hours.  Surgical pathology     Status: None   Collection Time: 10/27/22 11:37 AM  Result Value Ref Range   SURGICAL PATHOLOGY      SURGICAL PATHOLOGY CASE: [id number] PATIENT: Meryn Hruska Surgical Pathology Report     Clinical History: Acute blood loss anemia, melena, history of iron deficiency anemia (crm)     FINAL MICROSCOPIC DIAGNOSIS:  A. STOMACH, BIOPSY: Gastric mucosa with focal erosion, hyperemia and reactive changes. No Helicobacter pylori identified.   GROSS DESCRIPTION:  Received in formalin is a tan, soft tissue fragment that is submitted in toto.  Size: 0.3 x 0.2 x 0.2 cm cm, 1 block submitted. Renette Butters, 10/27/2022)    Final Diagnosis performed by Jimmy Picket, MD.   Electronically signed 10/30/2022 Technical component performed at [facility], [address], [place], [place] [address].  Professional component performed at [employer], [address], [place], [place] [address].  Immunohistochemistry Technical component (if applicable) was performed at [facility] [address], [place], [place] [address].   IMMUNOHISTOCHEMISTRY DISCLAIMER (if applicable): Some of these immunohistochemical stains may have been developed and the performance characteristics determine by [facility]. Some may not have been cleared or approved by the U.S. Food and  Drug Administration. The FDA has determined that such clearance or approval is not necessary. This test is used for clinical purposes. It should not be regarded as investigational or for research. This laboratory is certified under the Clinical Laboratory Improvement Amendments of 1988 (CLIA-88) as qualified to perform high complexity clinical laboratory testing.  The controls stained appropriately.   Glucose, capillary     Status: Abnormal   Collection Time: 10/27/22 12:00 PM  Result Value Ref Range   Glucose-Capillary 205 (H) 70 - 99 mg/dL    Comment: Glucose reference range applies only to samples taken after fasting for at least 8 hours.  Glucose, capillary     Status: Abnormal   Collection Time: 10/27/22  4:36 PM  Result Value Ref Range   Glucose-Capillary 128 (H) 70 - 99 mg/dL    Comment: Glucose reference range applies only to samples taken after fasting for at least 8 hours.  Glucose, capillary     Status: Abnormal   Collection Time:  10/27/22 10:23 PM  Result Value Ref Range   Glucose-Capillary 141 (H) 70 - 99 mg/dL    Comment: Glucose reference range applies only to samples taken after fasting for at least 8 hours.  Glucose, capillary     Status: Abnormal   Collection Time: 10/28/22  1:47 AM  Result Value Ref Range   Glucose-Capillary 140 (H) 70 - 99 mg/dL    Comment: Glucose reference range applies only to samples taken after fasting for at least 8 hours.  CBC     Status: Abnormal   Collection Time: 10/28/22  3:17 AM  Result Value Ref Range   WBC 21.0 (H) 4.0 - 10.5 K/uL   RBC 2.99 (L) 3.87 - 5.11 MIL/uL   Hemoglobin 8.1 (L) 12.0 - 15.0 g/dL   HCT 27.2 (L) 53.6 - 64.4 %   MCV 88.3 80.0 - 100.0 fL   MCH 27.1 26.0 - 34.0 pg   MCHC 30.7 30.0 - 36.0 g/dL   RDW 03.4 (H) 74.2 - 59.5 %   Platelets 429 (H) 150 - 400 K/uL   nRBC 0.3 (H) 0.0 - 0.2 %    Comment: Performed at [facility], [address] [place], [place] [address]  Basic metabolic panel     Status: Abnormal    Collection Time: 10/28/22  3:17 AM  Result Value Ref Range   Sodium 136 135 - 145 mmol/L   Potassium 3.4 (L) 3.5 - 5.1 mmol/L    Comment: DELTA CHECK NOTED   Chloride 102 98 - 111 mmol/L   CO2 26 22 - 32 mmol/L   Glucose, Bld 117 (H) 70 - 99 mg/dL    Comment: Glucose reference range applies only to samples taken after fasting for at least 8 hours.   BUN 7 (L) 8 - 23 mg/dL   Creatinine, Ser 6.43 0.44 - 1.00 mg/dL   Calcium 7.7 (L) 8.9 - 10.3 mg/dL   GFR, Estimated >32 >95 mL/min    Comment: (NOTE) Calculated using the CKD-EPI Creatinine Equation (2021)    Anion gap 8 5 - 15    Comment: Performed at [facility], [address], [place], [place] [address]  Magnesium     Status: None   Collection Time: 10/28/22  3:17 AM  Result Value Ref Range   Magnesium 1.7 1.7 - 2.4 mg/dL    Comment: Performed at [facility], [address] [place], [place] [address]  Procalcitonin - Baseline     Status: None   Collection Time: 10/28/22  3:17 AM  Result Value Ref Range   Procalcitonin 0.83 ng/mL    Comment:        Interpretation: PCT > 0.5 ng/mL and <= 2 ng/mL: Systemic infection (sepsis) is possible, but other conditions are known to elevate PCT as well. (NOTE)       Sepsis PCT Algorithm           Lower Respiratory Tract                                      Infection PCT Algorithm    ----------------------------     ----------------------------         PCT < 0.25 ng/mL                PCT < 0.10 ng/mL          Strongly encourage  Strongly discourage   discontinuation of antibiotics    initiation of antibiotics    ----------------------------     -----------------------------       PCT 0.25 - 0.50 ng/mL            PCT 0.10 - 0.25 ng/mL               OR       >80% decrease in PCT            Discourage initiation of                                            antibiotics      Encourage discontinuation           of antibiotics    ----------------------------      -----------------------------         PCT >= 0.50 ng/mL              PCT 0.26 - 0.50 ng/mL                AND       <80% decrease in PCT             Encourage initiation of                                             antibiotics       Encourage continuation           of antibiotics    ----------------------------     -----------------------------        PCT >= 0.50 ng/mL                  PCT > 0.50 ng/mL               AND         increase in PCT                  Strongly encourage                                      initiation of antibiotics    Strongly encourage escalation           of antibiotics                                     -----------------------------                                           PCT <= 0.25 ng/mL                                                 OR                                        >  80% decrease in PCT                                      Discontinue / Do not initiate                                             antibiotics  Performed at [facility], [address], [place], [place] [address]   Glucose, capillary     Status: Abnormal   Collection Time: 10/28/22  5:32 AM  Result Value Ref Range   Glucose-Capillary 162 (H) 70 - 99 mg/dL    Comment: Glucose reference range applies only to samples taken after fasting for at least 8 hours.  Culture, blood (Routine X 2) w Reflex to ID Panel     Status: Abnormal   Collection Time: 10/28/22  7:16 AM   Specimen: Right Antecubital; Blood  Result Value Ref Range   Specimen Description      RIGHT ANTECUBITAL BOTTLES DRAWN AEROBIC AND ANAEROBIC BLOOD Performed at [facility], [address], [place], [place] [address]    Special Requests      Blood Culture results may not be optimal due to an excessive volume of blood received in culture bottles Performed at [facility], [address] [place], [place] [address]    Culture  Setup Time      GRAM POSITIVE RODS Gram Stain Report Called to,Read Back By  and Verified With: J MUSE @ 1142 BY STEPHTR 10/29/22 Performed at [facility], [address], [place], [place] [address]    Culture (A)     BACILLUS SPECIES Standardized susceptibility testing for this organism is not available. Performed at [facility], [address], [place], [place] [address]    Report Status 10/30/2022 FINAL   Culture, blood (Routine X 2) w Reflex to ID Panel     Status: None   Collection Time: 10/28/22  7:16 AM   Specimen: Left Antecubital; Blood  Result Value Ref Range   Specimen Description      LEFT ANTECUBITAL BOTTLES DRAWN AEROBIC AND ANAEROBIC Performed at [facility], [address], [place], [place] [address]    Special Requests      Blood Culture adequate volume Performed at [facility], [address], [place], [place] [address]    Culture      NO GROWTH 5 DAYS Performed at [facility], [address], [place], [place] [address]    Report Status 11/02/2022 FINAL   Glucose, capillary     Status: Abnormal   Collection Time: 10/28/22  7:52 AM  Result Value Ref Range   Glucose-Capillary 150 (H) 70 - 99 mg/dL    Comment: Glucose reference range applies only to samples taken after fasting for at least 8 hours.  Glucose, capillary     Status: Abnormal   Collection Time: 10/28/22 11:24 AM  Result Value Ref Range   Glucose-Capillary 174 (H) 70 - 99 mg/dL    Comment: Glucose reference range applies only to samples taken after fasting for at least 8 hours.  Glucose, capillary     Status: Abnormal   Collection Time: 10/28/22  4:52 PM  Result Value Ref Range   Glucose-Capillary 142 (H) 70 - 99 mg/dL    Comment:  Glucose reference range applies only to samples taken after fasting for at least 8 hours.  Glucose, capillary     Status: Abnormal   Collection Time: 10/28/22  8:37 PM  Result Value Ref Range   Glucose-Capillary 142 (H) 70 - 99 mg/dL    Comment: Glucose reference range applies only to samples taken after fasting for at least 8  hours.  Glucose, capillary     Status: Abnormal   Collection Time: 10/28/22 11:36 PM  Result Value Ref Range   Glucose-Capillary 110 (H) 70 - 99 mg/dL    Comment: Glucose reference range applies only to samples taken after fasting for at least 8 hours.  Glucose, capillary     Status: Abnormal   Collection Time: 10/29/22  3:52 AM  Result Value Ref Range   Glucose-Capillary 139 (H) 70 - 99 mg/dL    Comment: Glucose reference range applies only to samples taken after fasting for at least 8 hours.  CBC     Status: Abnormal   Collection Time: 10/29/22  4:05 AM  Result Value Ref Range   WBC 22.2 (H) 4.0 - 10.5 K/uL   RBC 2.99 (L) 3.87 - 5.11 MIL/uL   Hemoglobin 8.0 (L) 12.0 - 15.0 g/dL   HCT 16.1 (L) 09.6 - 04.5 %   MCV 89.6 80.0 - 100.0 fL   MCH 26.8 26.0 - 34.0 pg   MCHC 29.9 (L) 30.0 - 36.0 g/dL   RDW 40.9 (H) 81.1 - 91.4 %   Platelets 447 (H) 150 - 400 K/uL   nRBC 0.1 0.0 - 0.2 %    Comment: Performed at [facility], [address], [place], [place] [address]  Basic metabolic panel     Status: Abnormal   Collection Time: 10/29/22  4:05 AM  Result Value Ref Range   Sodium 135 135 - 145 mmol/L   Potassium 3.2 (L) 3.5 - 5.1 mmol/L   Chloride 99 98 - 111 mmol/L   CO2 25 22 - 32 mmol/L   Glucose, Bld 118 (H) 70 - 99 mg/dL    Comment: Glucose reference range applies only to samples taken after fasting for at least 8 hours.   BUN 7 (L) 8 - 23 mg/dL   Creatinine, Ser 6.21 0.44 - 1.00 mg/dL   Calcium 8.1 (L) 8.9 - 10.3 mg/dL   GFR, Estimated >30 >86 mL/min    Comment: (NOTE) Calculated using the CKD-EPI Creatinine Equation (2021)    Anion gap 11 5 - 15    Comment: Performed at [facility], [address] [place], [place] [address]  Magnesium     Status: None   Collection Time: 10/29/22  4:05 AM  Result Value Ref Range   Magnesium 1.8 1.7 - 2.4 mg/dL    Comment: Performed at [facility], [address], [place], [place] [address]  Procalcitonin - Baseline     Status: None    Collection Time: 10/29/22  6:50 AM  Result Value Ref Range   Procalcitonin 0.75 ng/mL    Comment:        Interpretation: PCT > 0.5 ng/mL and <= 2 ng/mL: Systemic infection (sepsis) is possible, but other conditions are known to elevate PCT as well. (NOTE)       Sepsis PCT Algorithm           Lower Respiratory Tract  Infection PCT Algorithm    ----------------------------     ----------------------------         PCT < 0.25 ng/mL                PCT < 0.10 ng/mL          Strongly encourage             Strongly discourage   discontinuation of antibiotics    initiation of antibiotics    ----------------------------     -----------------------------       PCT 0.25 - 0.50 ng/mL            PCT 0.10 - 0.25 ng/mL               OR       >80% decrease in PCT            Discourage initiation of                                            antibiotics      Encourage discontinuation           of antibiotics    ----------------------------     -----------------------------         PCT >= 0.50 ng/mL              PCT 0.26 - 0.50 ng/mL                AND       <80% decrease in PCT             Encourage initiation of                                             antibiotics       Encourage continuation           of antibiotics    ----------------------------     -----------------------------        PCT >= 0.50 ng/mL                  PCT > 0.50 ng/mL               AND         increase in PCT                  Strongly encourage                                      initiation of antibiotics    Strongly encourage escalation           of antibiotics                                     -----------------------------                                           PCT <= 0.25 ng/mL  OR                                        > 80% decrease in PCT                                      Discontinue / Do not initiate                                              antibiotics  Performed at [facility], [address] [place], [place] [address]   Glucose, capillary     Status: Abnormal   Collection Time: 10/29/22  7:46 AM  Result Value Ref Range   Glucose-Capillary 128 (H) 70 - 99 mg/dL    Comment: Glucose reference range applies only to samples taken after fasting for at least 8 hours.  CBC with Differential     Status: Abnormal   Collection Time: 11/06/22  6:21 PM  Result Value Ref Range   WBC 14.8 (H) 4.0 - 10.5 K/uL   RBC 3.60 (L) 3.87 - 5.11 MIL/uL   Hemoglobin 9.3 (L) 12.0 - 15.0 g/dL   HCT 13.0 (L) 86.5 - 78.4 %   MCV 86.1 80.0 - 100.0 fL   MCH 25.8 (L) 26.0 - 34.0 pg   MCHC 30.0 30.0 - 36.0 g/dL   RDW 69.6 (H) 29.5 - 28.4 %   Platelets 793 (H) 150 - 400 K/uL   nRBC 0.0 0.0 - 0.2 %   Neutrophils Relative % 77 %   Neutro Abs 11.4 (H) 1.7 - 7.7 K/uL   Lymphocytes Relative 12 %   Lymphs Abs 1.8 0.7 - 4.0 K/uL   Monocytes Relative 9 %   Monocytes Absolute 1.4 (H) 0.1 - 1.0 K/uL   Eosinophils Relative 1 %   Eosinophils Absolute 0.1 0.0 - 0.5 K/uL   Basophils Relative 0 %   Basophils Absolute 0.1 0.0 - 0.1 K/uL   Immature Granulocytes 1 %   Abs Immature Granulocytes 0.10 (H) 0.00 - 0.07 K/uL    Comment: Performed at [facility], [address] [place], [place] [address]  Basic metabolic panel     Status: Abnormal   Collection Time: 11/06/22  6:21 PM  Result Value Ref Range   Sodium 138 135 - 145 mmol/L   Potassium 3.2 (L) 3.5 - 5.1 mmol/L   Chloride 100 98 - 111 mmol/L   CO2 26 22 - 32 mmol/L   Glucose, Bld 160 (H) 70 - 99 mg/dL    Comment: Glucose reference range applies only to samples taken after fasting for at least 8 hours.   BUN 14 8 - 23 mg/dL   Creatinine, Ser 0.10 0.44 - 1.00 mg/dL   Calcium 9.1 8.9 - 27.2 mg/dL   GFR, Estimated >53 >66 mL/min    Comment: (NOTE) Calculated using the CKD-EPI Creatinine Equation (2021)    Anion gap 12 5 - 15    Comment: Performed at [facility], [address] [place], [place] [address]  VAS [place] ABI WITH/WO TBI     Status: None   Collection Time: 12/05/22  2:35 PM  Result Value Ref Range   Right  ABI 0.22    Left ABI absent   I-STAT, chem 8     Status: Abnormal   Collection Time: 12/08/22 10:20 AM  Result Value Ref Range   Sodium 133 (L) 135 - 145 mmol/L   Potassium 3.3 (L) 3.5 - 5.1 mmol/L   Chloride 95 (L) 98 - 111 mmol/L   BUN 7 (L) 8 - 23 mg/dL   Creatinine, Ser 1.61 0.44 - 1.00 mg/dL   Glucose, Bld 096 (H) 70 - 99 mg/dL    Comment: Glucose reference range applies only to samples taken after fasting for at least 8 hours.   Calcium, Ion 1.03 (L) 1.15 - 1.40 mmol/L   TCO2 25 22 - 32 mmol/L   Hemoglobin 5.8 (LL) 12.0 - 15.0 g/dL   HCT 04.5 (L) 40.9 - 81.1 %   Comment NOTIFIED PHYSICIAN   CBC     Status: Abnormal   Collection Time: 12/08/22 10:29 AM  Result Value Ref Range   WBC 15.2 (H) 4.0 - 10.5 K/uL   RBC 2.21 (L) 3.87 - 5.11 MIL/uL   Hemoglobin 4.4 (LL) 12.0 - 15.0 g/dL    Comment: REPEATED TO VERIFY Reticulocyte Hemoglobin testing may be clinically indicated, consider ordering this additional test [id number] THIS CRITICAL RESULT HAS VERIFIED AND BEEN CALLED TO CASSANDRA KHOURI BY SHERRY GALLOWAY ON 03 08 2024 AT 1136, AND HAS BEEN READ BACK.     HCT 16.5 (L) 36.0 - 46.0 %   MCV 74.7 (L) 80.0 - 100.0 fL   MCH 19.9 (L) 26.0 - 34.0 pg   MCHC 26.7 (L) 30.0 - 36.0 g/dL   RDW 62.1 (H) 30.8 - 65.7 %   Platelets 824 (H) 150 - 400 K/uL   nRBC 0.4 (H) 0.0 - 0.2 %    Comment: Performed at [facility], [address] [place], [place] [address]  Basic metabolic panel     Status: Abnormal   Collection Time: 12/08/22 10:35 AM  Result Value Ref Range   Sodium 135 135 - 145 mmol/L   Potassium 3.3 (L) 3.5 - 5.1 mmol/L   Chloride 96 (L) 98 - 111 mmol/L   CO2 23 22 - 32 mmol/L   Glucose, Bld 191 (H) 70 - 99 mg/dL    Comment: Glucose reference range applies only to samples taken after fasting for at least 8 hours.    BUN 7 (L) 8 - 23 mg/dL   Creatinine, Ser 2.95 0.44 - 1.00 mg/dL   Calcium 8.6 (L) 8.9 - 10.3 mg/dL   GFR, Estimated >28 >41 mL/min    Comment: (NOTE) Calculated using the CKD-EPI Creatinine Equation (2021)    Anion gap 16 (H) 5 - 15    Comment: Performed at [facility], [address], [place], [place] [address]  Type and screen [facility]     Status: None   Collection Time: 12/08/22 11:25 AM  Result Value Ref Range   ABO/RH(D) O POS    Antibody Screen POS    Sample Expiration 12/11/2022,2359    Antibody Identification ANTI FYA (Duffy a)    Unit Number [id number]    Blood Component Type RED CELLS,LR    Unit division 00    Status of Unit ISSUED,FINAL    Transfusion Status OK TO TRANSFUSE    Crossmatch Result COMPATIBLE    Donor AG Type      NEGATIVE FOR DUFFY A ANTIGEN Performed at [facility], [address], [place],  [place] [address]    Unit Number [id number]    Blood Component Type RED CELLS,LR    Unit division 00    Status of Unit ISSUED,FINAL    Transfusion Status OK TO TRANSFUSE    Crossmatch Result COMPATIBLE    Donor AG Type NEGATIVE FOR DUFFY A ANTIGEN    Unit Number [id number]    Blood Component Type RED CELLS,LR    Unit division 00    Status of Unit ISSUED,FINAL    Donor AG Type NEGATIVE FOR DUFFY A ANTIGEN    Transfusion Status OK TO TRANSFUSE    Crossmatch Result COMPATIBLE   Protime-INR     Status: None   Collection Time: 12/08/22 11:25 AM  Result Value Ref Range   Prothrombin Time 15.2 11.4 - 15.2 seconds   INR 1.2 0.8 - 1.2    Comment: (NOTE) INR goal varies based on device and disease states. Performed at [facility], [address], [place], [place] [address]   APTT     Status: None   Collection Time: 12/08/22 11:25 AM  Result Value Ref Range   aPTT 34 24 - 36 seconds    Comment: Performed at [facility], [address], [place], [place] [address]  BPAM RBC     Status: None   Collection  Time: 12/08/22 11:25 AM  Result Value Ref Range   ISSUE DATE / TIME 027253664403    Blood Product Unit Number [id number]    PRODUCT CODE [id number]    Unit Type and Rh 5100    Blood Product Expiration Date 841660630160    ISSUE DATE / TIME 109323557322    Blood Product Unit Number [id number]    PRODUCT CODE [id number]    Unit Type and Rh 5100    Blood Product Expiration Date 607371062694    ISSUE DATE / TIME 854627035009    Blood Product Unit Number [id number]    PRODUCT CODE [id number]    Unit Type and Rh 5100    Blood Product Expiration Date 751025852778   POC occult blood, ED     Status: None   Collection Time: 12/08/22 11:56 AM  Result Value Ref Range   Fecal Occult Bld NEGATIVE NEGATIVE  Prepare RBC (crossmatch)     Status: None   Collection Time: 12/08/22 12:00 PM  Result Value Ref Range   Order Confirmation      ORDER PROCESSED BY BLOOD BANK Performed at [facility], [address] [place], [place] [address]   Lactic acid, plasma     Status: Abnormal   Collection Time: 12/08/22 12:11 PM  Result Value Ref Range   Lactic Acid, Venous 3.8 (HH) 0.5 - 1.9 mmol/L    Comment: CRITICAL RESULT CALLED TO, READ BACK BY AND VERIFIED WITH Nicholes Rough, RN 1301 12/08/22 L. KLAR Performed at [facility], [address] [address], [place], [place] [address]   Vitamin B12     Status: None   Collection Time: 12/08/22  3:23 PM  Result Value Ref Range   Vitamin B-12 588 180 - 914 pg/mL    Comment: (NOTE) This assay is not validated for testing neonatal or myeloproliferative syndrome specimens for Vitamin B12 levels. Performed at [facility], [address], [place], [place] [address]   Reticulocytes     Status: Abnormal   Collection Time: 12/08/22  3:23 PM  Result Value Ref Range   Retic Ct Pct 1.7 0.4 - 3.1 %   RBC.  2.03 (L) 3.87 - 5.11 MIL/uL   Retic Count, Absolute 33.7 19.0 - 186.0 K/uL   Immature Retic Fract 39.4 (H) 2.3 - 15.9 %    Comment: Performed at [facility], [address] [place], [place] [address]  Folate     Status: None   Collection Time: 12/08/22  3:23 PM  Result Value Ref Range   Folate 8.2 >5.9 ng/mL    Comment: Performed at [facility], [address] [place], [place] [address]  Iron and TIBC     Status: Abnormal   Collection Time: 12/08/22  3:25 PM  Result Value Ref Range   Iron 14 (L) 28 - 170 ug/dL   TIBC 425 956 - 387 ug/dL   Saturation Ratios 4 (L) 10.4 - 31.8 %   UIBC 388 ug/dL    Comment: Performed at [facility], [address] [place], [place] [address]  Ferritin     Status: None   Collection Time: 12/08/22  3:25 PM  Result Value Ref Range   Ferritin 11 11 - 307 ng/mL    Comment: Performed at [facility], [address], [place], [place] [address]  Hemoglobin and hematocrit, blood     Status: Abnormal   Collection Time: 12/08/22  3:25 PM  Result Value Ref Range   Hemoglobin 4.0 (LL) 12.0 - 15.0 g/dL    Comment: REPEATED TO VERIFY THIS CRITICAL RESULT HAS VERIFIED AND BEEN CALLED TO C. KHOURI, RN BY SHAY EKDAHL ON 03 08 2024 AT 1700, AND HAS BEEN READ BACK.     HCT 15.4 (L) 36.0 - 46.0 %    Comment: Performed at [facility], [address], [place], [place] [address]  Prepare RBC (crossmatch)     Status: None   Collection Time: 12/08/22  4:11 PM  Result Value Ref Range   Order Confirmation      ORDER PROCESSED BY BLOOD BANK Performed at [facility], [address] [place], [place] [address]   CBC     Status: Abnormal   Collection Time: 12/09/22  4:56 AM  Result Value Ref Range   WBC 15.0 (H) 4.0 - 10.5 K/uL   RBC 3.72 (L) 3.87 - 5.11 MIL/uL   Hemoglobin 9.4 (L) 12.0 - 15.0 g/dL    Comment: REPEATED TO VERIFY POST TRANSFUSION SPECIMEN    HCT 28.5 (L) 36.0 - 46.0 %   MCV 76.6 (L) 80.0 - 100.0 fL   MCH 25.3 (L) 26.0 - 34.0 pg   MCHC 33.0 30.0 - 36.0 g/dL   RDW 60.1 (H) 09.3 - 23.5 %   Platelets 672 (H) 150 - 400 K/uL   nRBC 0.0 0.0 - 0.2 %    Comment:  Performed at [facility], [address] [place], [place] [address]  Comprehensive metabolic panel     Status: Abnormal   Collection Time: 12/09/22  4:56 AM  Result Value Ref Range   Sodium 135 135 - 145 mmol/L   Potassium 3.2 (L) 3.5 - 5.1 mmol/L   Chloride 101 98 - 111 mmol/L   CO2 25 22 - 32 mmol/L   Glucose, Bld 96 70 - 99 mg/dL    Comment: Glucose reference range applies only to samples taken after fasting for at least 8 hours.   BUN 6 (L) 8 - 23 mg/dL   Creatinine, Ser 0.25 0.44 - 1.00 mg/dL   Calcium 8.1 (L) 8.9 - 10.3  mg/dL   Total Protein 6.1 (L) 6.5 - 8.1 g/dL   Albumin 2.3 (L) 3.5 - 5.0 g/dL   AST 18 15 - 41 U/L   ALT 11 0 - 44 U/L   Alkaline Phosphatase 98 38 - 126 U/L   Total Bilirubin 1.8 (H) 0.3 - 1.2 mg/dL   GFR, Estimated >13 >08 mL/min    Comment: (NOTE) Calculated using the CKD-EPI Creatinine Equation (2021)    Anion gap 9 5 - 15    Comment: Performed at [facility], [address], [place], [place] [address]  Magnesium     Status: None   Collection Time: 12/09/22  4:56 AM  Result Value Ref Range   Magnesium 2.0 1.7 - 2.4 mg/dL    Comment: Performed at [facility], [address], [place], [place] [address]  Glucose, capillary     Status: Abnormal   Collection Time: 12/09/22 11:27 AM  Result Value Ref Range   Glucose-Capillary 113 (H) 70 - 99 mg/dL    Comment: Glucose reference range applies only to samples taken after fasting for at least 8 hours.  Glucose, capillary     Status: Abnormal   Collection Time: 12/09/22 12:20 PM  Result Value Ref Range   Glucose-Capillary 106 (H) 70 - 99 mg/dL    Comment: Glucose reference range applies only to samples taken after fasting for at least 8 hours.  CBC     Status: Abnormal   Collection Time: 12/10/22  4:34 AM  Result Value Ref Range   WBC 24.4 (H) 4.0 - 10.5 K/uL   RBC 3.71 (L) 3.87 - 5.11 MIL/uL   Hemoglobin 9.0 (L) 12.0 - 15.0 g/dL   HCT 52.8 (L) 41.3 - 24.4 %   MCV 79.0 (L) 80.0 -  100.0 fL   MCH 24.3 (L) 26.0 - 34.0 pg   MCHC 30.7 30.0 - 36.0 g/dL   RDW 01.0 (H) 27.2 - 53.6 %   Platelets 669 (H) 150 - 400 K/uL   nRBC 0.1 0.0 - 0.2 %    Comment: Performed at [facility], [address], [place], [place] [address]  Potassium     Status: Abnormal   Collection Time: 12/10/22 10:29 AM  Result Value Ref Range   Potassium 3.3 (L) 3.5 - 5.1 mmol/L    Comment: Performed at [facility], [address] [place], [place] [address]  CBC with Differential     Status: Abnormal   Collection Time: 12/13/22  5:55 PM  Result Value Ref Range   WBC 14.4 (H) 4.0 - 10.5 K/uL   RBC 4.42 3.87 - 5.11 MIL/uL   Hemoglobin 10.6 (L) 12.0 - 15.0 g/dL   HCT 95.6 38.7 - 56.4 %   MCV 83.0 80.0 - 100.0 fL   MCH 24.0 (L) 26.0 - 34.0 pg   MCHC 28.9 (L) 30.0 - 36.0 g/dL   RDW 33.2 (H) 95.1 - 88.4 %   Platelets 803 (H) 150 - 400 K/uL   nRBC 0.0 0.0 - 0.2 %   Neutrophils Relative % 77 %   Neutro Abs 11.2 (H) 1.7 - 7.7 K/uL   Lymphocytes Relative 9 %   Lymphs Abs 1.3 0.7 - 4.0 K/uL   Monocytes Relative 11 %   Monocytes Absolute 1.6 (H) 0.1 - 1.0 K/uL   Eosinophils Relative 1 %   Eosinophils Absolute 0.1 0.0 - 0.5 K/uL   Basophils Relative 0 %   Basophils Absolute 0.1  0.0 - 0.1 K/uL   Immature Granulocytes 2 %   Abs Immature Granulocytes 0.23 (H) 0.00 - 0.07 K/uL    Comment: Performed at [facility], [address] [place], [place] [address]  Comprehensive metabolic panel     Status: Abnormal   Collection Time: 12/13/22  5:55 PM  Result Value Ref Range   Sodium 137 135 - 145 mmol/L   Potassium 3.1 (L) 3.5 - 5.1 mmol/L   Chloride 101 98 - 111 mmol/L   CO2 24 22 - 32 mmol/L   Glucose, Bld 153 (H) 70 - 99 mg/dL    Comment: Glucose reference range applies only to samples taken after fasting for at least 8 hours.   BUN 11 8 - 23 mg/dL   Creatinine, Ser 6.04 (H) 0.44 - 1.00 mg/dL   Calcium 9.6 8.9 - 54.0 mg/dL   Total Protein 7.4 6.5 - 8.1 g/dL   Albumin 2.8 (L) 3.5 -  5.0 g/dL   AST 12 (L) 15 - 41 U/L   ALT 10 0 - 44 U/L   Alkaline Phosphatase 125 38 - 126 U/L   Total Bilirubin 0.2 (L) 0.3 - 1.2 mg/dL   GFR, Estimated 55 (L) >60 mL/min    Comment: (NOTE) Calculated using the CKD-EPI Creatinine Equation (2021)    Anion gap 12 5 - 15    Comment: Performed at [facility], [address], [place], [place] [address]  Lactic acid, plasma     Status: None   Collection Time: 12/13/22  5:58 PM  Result Value Ref Range   Lactic Acid, Venous 1.6 0.5 - 1.9 mmol/L    Comment: Performed at [facility], [address] [place], [place] [address]  Blood culture (routine x 2)     Status: None   Collection Time: 12/13/22  5:58 PM   Specimen: BLOOD LEFT FOREARM  Result Value Ref Range   Specimen Description BLOOD LEFT FOREARM    Special Requests      BOTTLES DRAWN AEROBIC AND ANAEROBIC Blood Culture results may not be optimal due to an excessive volume of blood received in culture bottles   Culture      NO GROWTH 5 DAYS Performed at [facility], [address], [place], [place] [address]    Report Status 12/18/2022 FINAL   CBC     Status: Abnormal   Collection Time: 12/14/22  8:16 AM  Result Value Ref Range   WBC 14.5 (H) 4.0 - 10.5 K/uL   RBC 4.06 3.87 - 5.11 MIL/uL   Hemoglobin 10.1 (L) 12.0 - 15.0 g/dL   HCT 30.8 (L) 65.7 - 84.6 %   MCV 84.2 80.0 - 100.0 fL   MCH 24.9 (L) 26.0 - 34.0 pg   MCHC 29.5 (L) 30.0 - 36.0 g/dL   RDW 96.2 (H) 95.2 - 84.1 %   Platelets 653 (H) 150 - 400 K/uL   nRBC 0.0 0.0 - 0.2 %    Comment: Performed at [facility], [address], [place], [place] [address]  Creatinine, serum     Status: None   Collection Time: 12/14/22  8:16 AM  Result Value Ref Range   Creatinine, Ser 0.74 0.44 - 1.00 mg/dL   GFR, Estimated >10 >27 mL/min    Comment: (NOTE) Calculated using the CKD-EPI Creatinine Equation (2021) Performed at [facility], [address], [place], [place] [address]   CBC     Status:  Abnormal   Collection Time: 12/15/22  8:17 AM  Result Value Ref Range   WBC 15.0 (H) 4.0 - 10.5 K/uL   RBC 4.07 3.87 - 5.11 MIL/uL   Hemoglobin 10.3 (L) 12.0 - 15.0 g/dL   HCT 60.4 (L) 54.0 - 98.1 %   MCV 82.6 80.0 - 100.0 fL   MCH 25.3 (L) 26.0 - 34.0 pg   MCHC 30.7 30.0 - 36.0 g/dL   RDW 19.1 (H) 47.8 - 29.5 %   Platelets 607 (H) 150 - 400 K/uL   nRBC 0.0 0.0 - 0.2 %    Comment: Performed at [facility], [address] [place], [place], [place] [address]  Basic metabolic panel     Status: Abnormal   Collection Time: 12/15/22  8:17 AM  Result Value Ref Range   Sodium 135 135 - 145 mmol/L   Potassium 3.4 (L) 3.5 - 5.1 mmol/L   Chloride 97 (L) 98 - 111 mmol/L   CO2 26 22 - 32 mmol/L   Glucose, Bld 98 70 - 99 mg/dL    Comment: Glucose reference range applies only to samples taken after fasting for at least 8 hours.   BUN 10 8 - 23 mg/dL   Creatinine, Ser 8.65 0.44 - 1.00 mg/dL   Calcium 9.0 8.9 - 78.4 mg/dL   GFR, Estimated >69 >62 mL/min    Comment: (NOTE) Calculated using the CKD-EPI Creatinine Equation (2021)    Anion gap 12 5 - 15    Comment: Performed at [facility], [address] [place], [place] [address]  Glucose, capillary     Status: Abnormal   Collection Time: 12/15/22  8:22 AM  Result Value Ref Range   Glucose-Capillary 113 (H) 70 - 99 mg/dL    Comment: Glucose reference range applies only to samples taken after fasting for at least 8 hours.  Glucose, capillary     Status: None   Collection Time: 12/15/22 11:13 AM  Result Value Ref Range   Glucose-Capillary 91 70 - 99 mg/dL    Comment: Glucose reference range applies only to samples taken after fasting for at least 8 hours.  Glucose, capillary     Status: None   Collection Time: 12/15/22  6:38 PM  Result Value Ref Range   Glucose-Capillary 95 70 - 99 mg/dL    Comment: Glucose reference range applies only to samples taken after fasting for at least 8 hours.  Glucose, capillary     Status: Abnormal    Collection Time: 12/15/22  8:44 PM  Result Value Ref Range   Glucose-Capillary 101 (H) 70 - 99 mg/dL    Comment: Glucose reference range applies only to samples taken after fasting for at least 8 hours.  CBC     Status: Abnormal   Collection Time: 12/16/22  1:34 AM  Result Value Ref Range   WBC 13.0 (H) 4.0 - 10.5 K/uL   RBC 3.67 (L) 3.87 - 5.11 MIL/uL   Hemoglobin 9.2 (L) 12.0 - 15.0 g/dL   HCT 13.2 (L) 44.0 - 10.2 %   MCV 82.3 80.0 - 100.0 fL   MCH 25.1 (L) 26.0 - 34.0 pg   MCHC 30.5 30.0 - 36.0 g/dL   RDW 72.5 (H) 36.6 - 44.0 %   Platelets 495 (H) 150 - 400 K/uL   nRBC 0.0 0.0 - 0.2 %    Comment: Performed at [facility], [address] [place], [place] [address]  Basic metabolic panel  Status: Abnormal   Collection Time: 12/16/22  1:34 AM  Result Value Ref Range   Sodium 134 (L) 135 - 145 mmol/L   Potassium 3.2 (L) 3.5 - 5.1 mmol/L   Chloride 98 98 - 111 mmol/L   CO2 24 22 - 32 mmol/L   Glucose, Bld 96 70 - 99 mg/dL    Comment: Glucose reference range applies only to samples taken after fasting for at least 8 hours.   BUN 9 8 - 23 mg/dL   Creatinine, Ser 4.09 0.44 - 1.00 mg/dL   Calcium 8.6 (L) 8.9 - 10.3 mg/dL   GFR, Estimated >81 >19 mL/min    Comment: (NOTE) Calculated using the CKD-EPI Creatinine Equation (2021)    Anion gap 12 5 - 15    Comment: Performed at [facility], [address] [place], [place] [address]  Lipid panel     Status: Abnormal   Collection Time: 12/16/22  1:34 AM  Result Value Ref Range   Cholesterol 79 0 - 200 mg/dL   Triglycerides 956 <213 mg/dL   HDL 24 (L) >08 mg/dL   Total CHOL/HDL Ratio 3.3 RATIO   VLDL 26 0 - 40 mg/dL   LDL Cholesterol 29 0 - 99 mg/dL    Comment:        Total Cholesterol/HDL:CHD Risk Coronary Heart Disease Risk Table                     Men   Women  1/2 Average Risk   3.4   3.3  Average Risk       5.0   4.4  2 X Average Risk   9.6   7.1  3 X Average Risk  23.4   11.0        Use the calculated  Patient Ratio above and the CHD Risk Table to determine the patient's CHD Risk.        ATP III CLASSIFICATION (LDL):  <100     mg/dL   Optimal  657-846  mg/dL   Near or Above                    Optimal  130-159  mg/dL   Borderline  962-952  mg/dL   High  >841     mg/dL   Very High Performed at [facility], [address] [place], [place] [address]   Glucose, capillary     Status: Abnormal   Collection Time: 12/16/22  6:34 AM  Result Value Ref Range   Glucose-Capillary 127 (H) 70 - 99 mg/dL    Comment: Glucose reference range applies only to samples taken after fasting for at least 8 hours.  Glucose, capillary     Status: Abnormal   Collection Time: 12/16/22 11:21 AM  Result Value Ref Range   Glucose-Capillary 106 (H) 70 - 99 mg/dL    Comment: Glucose reference range applies only to samples taken after fasting for at least 8 hours.  Glucose, capillary     Status: Abnormal   Collection Time: 12/16/22  4:35 PM  Result Value Ref Range   Glucose-Capillary 116 (H) 70 - 99 mg/dL    Comment: Glucose reference range applies only to samples taken after fasting for at least 8 hours.  Glucose, capillary     Status: None   Collection Time: 12/16/22  8:44 PM  Result Value Ref Range   Glucose-Capillary 84 70 - 99 mg/dL    Comment: Glucose  reference range applies only to samples taken after fasting for at least 8 hours.  CBC     Status: Abnormal   Collection Time: 12/17/22  1:24 AM  Result Value Ref Range   WBC 13.8 (H) 4.0 - 10.5 K/uL   RBC 3.74 (L) 3.87 - 5.11 MIL/uL   Hemoglobin 9.1 (L) 12.0 - 15.0 g/dL   HCT 16.1 (L) 09.6 - 04.5 %   MCV 85.6 80.0 - 100.0 fL   MCH 24.3 (L) 26.0 - 34.0 pg   MCHC 28.4 (L) 30.0 - 36.0 g/dL   RDW 40.9 (H) 81.1 - 91.4 %   Platelets 474 (H) 150 - 400 K/uL   nRBC 0.0 0.0 - 0.2 %    Comment: Performed at [facility], [address] [place], [place] [address]  Basic metabolic panel     Status: None   Collection Time: 12/17/22  1:24 AM   Result Value Ref Range   Sodium 138 135 - 145 mmol/L   Potassium 4.9 3.5 - 5.1 mmol/L   Chloride 103 98 - 111 mmol/L   CO2 27 22 - 32 mmol/L   Glucose, Bld 85 70 - 99 mg/dL    Comment: Glucose reference range applies only to samples taken after fasting for at least 8 hours.   BUN 11 8 - 23 mg/dL   Creatinine, Ser 6.21 0.44 - 1.00 mg/dL   Calcium 9.2 8.9 - 30.8 mg/dL   GFR, Estimated >65 >78 mL/min    Comment: (NOTE) Calculated using the CKD-EPI Creatinine Equation (2021)    Anion gap 8 5 - 15    Comment: Performed at [facility], [address] [place], [place] [address]  Glucose, capillary     Status: None   Collection Time: 12/17/22  7:30 AM  Result Value Ref Range   Glucose-Capillary 89 70 - 99 mg/dL    Comment: Glucose reference range applies only to samples taken after fasting for at least 8 hours.  Glucose, capillary     Status: None   Collection Time: 12/17/22 11:58 AM  Result Value Ref Range   Glucose-Capillary 89 70 - 99 mg/dL    Comment: Glucose reference range applies only to samples taken after fasting for at least 8 hours.  Glucose, capillary     Status: Abnormal   Collection Time: 12/17/22  5:11 PM  Result Value Ref Range   Glucose-Capillary 142 (H) 70 - 99 mg/dL    Comment: Glucose reference range applies only to samples taken after fasting for at least 8 hours.  Glucose, capillary     Status: None   Collection Time: 12/17/22  9:25 PM  Result Value Ref Range   Glucose-Capillary 98 70 - 99 mg/dL    Comment: Glucose reference range applies only to samples taken after fasting for at least 8 hours.  Basic metabolic panel     Status: Abnormal   Collection Time: 12/18/22  1:20 AM  Result Value Ref Range   Sodium 130 (L) 135 - 145 mmol/L   Potassium 4.8 3.5 - 5.1 mmol/L    Comment: HEMOLYSIS AT THIS LEVEL MAY AFFECT RESULT   Chloride 97 (L) 98 - 111 mmol/L   CO2 21 (L) 22 - 32 mmol/L   Glucose, Bld 132 (H) 70 - 99 mg/dL    Comment: Glucose reference  range applies only to samples taken after fasting for at least 8 hours.   BUN 13 8 - 23 mg/dL   Creatinine,  Ser 0.55 0.44 - 1.00 mg/dL   Calcium 8.7 (L) 8.9 - 10.3 mg/dL   GFR, Estimated >19 >62 mL/min    Comment: (NOTE) Calculated using the CKD-EPI Creatinine Equation (2021)    Anion gap 12 5 - 15    Comment: Performed at [facility], [address] [place], [place] [address]  Glucose, capillary     Status: Abnormal   Collection Time: 12/18/22  6:04 AM  Result Value Ref Range   Glucose-Capillary 123 (H) 70 - 99 mg/dL    Comment: Glucose reference range applies only to samples taken after fasting for at least 8 hours.  POCT Activated clotting time     Status: None   Collection Time: 12/18/22  9:44 AM  Result Value Ref Range   Activated Clotting Time 255 seconds    Comment: Reference range 74-137 seconds for patients not on anticoagulant therapy.  POCT Activated clotting time     Status: None   Collection Time: 12/18/22 10:36 AM  Result Value Ref Range   Activated Clotting Time 212 seconds    Comment: Reference range 74-137 seconds for patients not on anticoagulant therapy.  Glucose, capillary     Status: None   Collection Time: 12/18/22 11:52 AM  Result Value Ref Range   Glucose-Capillary 93 70 - 99 mg/dL    Comment: Glucose reference range applies only to samples taken after fasting for at least 8 hours.  POCT Activated clotting time     Status: None   Collection Time: 12/18/22 11:54 AM  Result Value Ref Range   Activated Clotting Time 185 seconds    Comment: Reference range 74-137 seconds for patients not on anticoagulant therapy.  POCT Activated clotting time     Status: None   Collection Time: 12/18/22 12:41 PM  Result Value Ref Range   Activated Clotting Time 174 seconds    Comment: Reference range 74-137 seconds for patients not on anticoagulant therapy.  CBC     Status: Abnormal   Collection Time: 12/18/22  2:16 PM  Result Value Ref Range   WBC 9.6 4.0 - 10.5  K/uL   RBC 3.90 3.87 - 5.11 MIL/uL   Hemoglobin 9.9 (L) 12.0 - 15.0 g/dL   HCT 89.2 (L) 11.9 - 41.7 %   MCV 84.1 80.0 - 100.0 fL   MCH 25.4 (L) 26.0 - 34.0 pg   MCHC 30.2 30.0 - 36.0 g/dL   RDW 40.8 (H) 14.4 - 81.8 %   Platelets 426 (H) 150 - 400 K/uL   nRBC 0.0 0.0 - 0.2 %    Comment: Performed at [facility], [address], [place], [place] [address]  Creatinine, serum     Status: None   Collection Time: 12/18/22  2:16 PM  Result Value Ref Range   Creatinine, Ser 0.51 0.44 - 1.00 mg/dL   GFR, Estimated >97 >02 mL/min    Comment: (NOTE) Calculated using the CKD-EPI Creatinine Equation (2021) Performed at [facility], [address], [place], [place] [address]   Glucose, capillary     Status: None   Collection Time: 12/18/22  4:32 PM  Result Value Ref Range   Glucose-Capillary 91 70 - 99 mg/dL    Comment: Glucose reference range applies only to samples taken after fasting for at least 8 hours.   Comment 1 Notify RN    Comment 2 Document in Chart   Glucose, capillary     Status: None   Collection Time:  12/18/22  8:59 PM  Result Value Ref Range   Glucose-Capillary 96 70 - 99 mg/dL    Comment: Glucose reference range applies only to samples taken after fasting for at least 8 hours.  Lipid panel     Status: Abnormal   Collection Time: 12/19/22  1:03 AM  Result Value Ref Range   Cholesterol 89 0 - 200 mg/dL   Triglycerides 161 <096 mg/dL   HDL 28 (L) >04 mg/dL   Total CHOL/HDL Ratio 3.2 RATIO   VLDL 23 0 - 40 mg/dL   LDL Cholesterol 38 0 - 99 mg/dL    Comment:        Total Cholesterol/HDL:CHD Risk Coronary Heart Disease Risk Table                     Men   Women  1/2 Average Risk   3.4   3.3  Average Risk       5.0   4.4  2 X Average Risk   9.6   7.1  3 X Average Risk  23.4   11.0        Use the calculated Patient Ratio above and the CHD Risk Table to determine the patient's CHD Risk.        ATP III CLASSIFICATION (LDL):  <100     mg/dL   Optimal   540-981  mg/dL   Near or Above                    Optimal  130-159  mg/dL   Borderline  191-478  mg/dL   High  >295     mg/dL   Very High Performed at [facility], [address], [place], [place] [address]   Basic metabolic panel     Status: Abnormal   Collection Time: 12/19/22  1:03 AM  Result Value Ref Range   Sodium 131 (L) 135 - 145 mmol/L   Potassium 4.0 3.5 - 5.1 mmol/L   Chloride 97 (L) 98 - 111 mmol/L   CO2 24 22 - 32 mmol/L   Glucose, Bld 78 70 - 99 mg/dL    Comment: Glucose reference range applies only to samples taken after fasting for at least 8 hours.   BUN 8 8 - 23 mg/dL   Creatinine, Ser 8.65 0.44 - 1.00 mg/dL   Calcium 8.7 (L) 8.9 - 10.3 mg/dL   GFR, Estimated >78 >46 mL/min    Comment: (NOTE) Calculated using the CKD-EPI Creatinine Equation (2021)    Anion gap 10 5 - 15    Comment: Performed at [facility], [address], [place], [place] [address]  Glucose, capillary     Status: None   Collection Time: 12/19/22  5:53 AM  Result Value Ref Range   Glucose-Capillary 85 70 - 99 mg/dL    Comment: Glucose reference range applies only to samples taken after fasting for at least 8 hours.   Comment 1 Notify RN    Comment 2 Document in Chart   VAS [place] ABI WITH/WO TBI     Status: None   Collection Time: 12/27/22  2:47 PM  Result Value Ref Range   Right ABI 0.86    Left ABI 0.66   Surgical pcr screen     Status: Abnormal   Collection Time: 01/11/23  8:42 AM   Specimen: Nasal Mucosa; Nasal Swab  Result Value Ref Range   MRSA, PCR NEGATIVE NEGATIVE   Staphylococcus  aureus POSITIVE (A) NEGATIVE    Comment: (NOTE) The Xpert SA Assay (FDA approved for NASAL specimens in patients 57 years of age and older), is one component of a comprehensive surveillance program. It is not intended to diagnose infection nor to guide or monitor treatment. Performed at [facility], [address] [place], [place] [address]   Glucose, capillary     Status:  Abnormal   Collection Time: 01/11/23  8:59 AM  Result Value Ref Range   Glucose-Capillary 211 (H) 70 - 99 mg/dL    Comment: Glucose reference range applies only to samples taken after fasting for at least 8 hours.  Type and screen     Status: None   Collection Time: 01/11/23  9:20 AM  Result Value Ref Range   ABO/RH(D) O POS    Antibody Screen POS    Sample Expiration 01/14/2023,2359    Antibody Identification ANTI FYA (Duffy a)    Unit Number [id number]    Blood Component Type RED CELLS,LR    Unit division 00    Status of Unit ISSUED,FINAL    Donor AG Type NEGATIVE FOR DUFFY A ANTIGEN    Transfusion Status OK TO TRANSFUSE    Crossmatch Result COMPATIBLE    Unit Number [id number]    Blood Component Type RBC LR PHER2    Unit division 00    Status of Unit REL FROM [facility]    Donor AG Type NEGATIVE FOR DUFFY A ANTIGEN    Transfusion Status OK TO TRANSFUSE    Crossmatch Result COMPATIBLE    Unit Number [id number]    Blood Component Type RBC LR PHER1    Unit division 00    Status of Unit REL FROM [facility]    Donor AG Type NEGATIVE FOR DUFFY A ANTIGEN    Transfusion Status OK TO TRANSFUSE    Crossmatch Result COMPATIBLE    Unit Number [id number]    Blood Component Type RED CELLS,LR    Unit division 00    Status of Unit REL FROM [facility]    Donor AG Type NEGATIVE FOR DUFFY A ANTIGEN    Transfusion Status OK TO TRANSFUSE    Crossmatch Result COMPATIBLE    Unit Number [id number]    Blood Component Type RED CELLS,LR    Unit division 00    Status of Unit REL FROM [facility]    Transfusion Status OK TO TRANSFUSE    Crossmatch Result COMPATIBLE    Unit Number [id number]    Blood Component Type RED CELLS,LR    Unit division 00    Status of Unit REL FROM [facility]    Transfusion Status OK TO TRANSFUSE    Crossmatch Result COMPATIBLE   BPAM RBC     Status: None   Collection Time: 01/11/23  9:20 AM  Result Value Ref Range   ISSUE DATE / TIME [id number]    Blood Product Unit  Number [id number]    PRODUCT CODE [id number]    Unit Type and Rh 5100    Blood Product Expiration Date 193790240973    ISSUE DATE / TIME 532992426834    Blood Product Unit Number [id number]    PRODUCT CODE [id number]    Unit Type and Rh 5100    Blood Product Expiration Date 144818563149    Blood Product Unit Number [id number]    PRODUCT CODE [id number]    Unit Type and Rh 5100    Blood Product Expiration Date 676720947096    Blood Product Unit Number [id number]  PRODUCT CODE [id number]    Unit Type and Rh 5100    Blood Product Expiration Date 161096045409    Blood Product Unit Number [id number]    PRODUCT CODE E0382V00    Unit Type and Rh 5100    Blood Product Expiration Date 130865784696    Blood Product Unit Number [id number]    PRODUCT CODE E0382V00    Unit Type and Rh 5100    Blood Product Expiration Date 027253664403   APTT     Status: None   Collection Time: 01/11/23  9:33 AM  Result Value Ref Range   aPTT 29 24 - 36 seconds    Comment: Performed at [facility], [address] [place], [place] [address]  CBC     Status: Abnormal   Collection Time: 01/11/23  9:33 AM  Result Value Ref Range   WBC 28.0 (H) 4.0 - 10.5 K/uL   RBC 2.78 (L) 3.87 - 5.11 MIL/uL   Hemoglobin 7.8 (L) 12.0 - 15.0 g/dL   HCT 95.6 (L) 38.7 - 56.4 %   MCV 86.3 80.0 - 100.0 fL   MCH 28.1 26.0 - 34.0 pg   MCHC 32.5 30.0 - 36.0 g/dL   RDW 33.2 (H) 95.1 - 88.4 %   Platelets 676 (H) 150 - 400 K/uL   nRBC 0.0 0.0 - 0.2 %    Comment: Performed at [facility], [address] [place], [place] [address]  Comprehensive metabolic panel     Status: Abnormal   Collection Time: 01/11/23  9:33 AM  Result Value Ref Range   Sodium 130 (L) 135 - 145 mmol/L   Potassium 3.3 (L) 3.5 - 5.1 mmol/L   Chloride 91 (L) 98 - 111 mmol/L   CO2 21 (L) 22 - 32 mmol/L   Glucose, Bld 201 (H) 70 - 99 mg/dL    Comment: Glucose reference range applies only to samples taken after fasting for at least  8 hours.   BUN 26 (H) 8 - 23 mg/dL   Creatinine, Ser 3.01 (H) 0.44 - 1.00 mg/dL   Calcium 9.4 8.9 - 60.1 mg/dL   Total Protein 7.0 6.5 - 8.1 g/dL   Albumin 2.6 (L) 3.5 - 5.0 g/dL   AST 19 15 - 41 U/L   ALT 10 0 - 44 U/L   Alkaline Phosphatase 115 38 - 126 U/L   Total Bilirubin 0.5 0.3 - 1.2 mg/dL   GFR, Estimated 26 (L) >60 mL/min    Comment: (NOTE) Calculated using the CKD-EPI Creatinine Equation (2021)    Anion gap 18 (H) 5 - 15    Comment: Performed at [facility], [address] [place], [place] [address]  Protime-INR     Status: None   Collection Time: 01/11/23  9:33 AM  Result Value Ref Range   Prothrombin Time 13.5 11.4 - 15.2 seconds   INR 1.0 0.8 - 1.2    Comment: (NOTE) INR goal varies based on device and disease states. Performed at [facility], [address], [place], [place] [address]   I-STAT, [place] 8     Status: Abnormal   Collection Time: 01/11/23 11:25 AM  Result Value Ref Range   Sodium 128 (L) 135 - 145 mmol/L   Potassium 2.9 (L) 3.5 - 5.1 mmol/L   Chloride 92 (L) 98 - 111 mmol/L   BUN 24 (H) 8 - 23 mg/dL   Creatinine, Ser 2.02 (H) 0.44 - 1.00 mg/dL  Glucose, Bld 183 (H) 70 - 99 mg/dL    Comment: Glucose reference range applies only to samples taken after fasting for at least 8 hours.   Calcium, Ion 1.23 1.15 - 1.40 mmol/L   TCO2 25 22 - 32 mmol/L   Hemoglobin 9.2 (L) 12.0 - 15.0 g/dL   HCT 16.1 (L) 09.6 - 04.5 %  Glucose, capillary     Status: Abnormal   Collection Time: 01/11/23 12:28 PM  Result Value Ref Range   Glucose-Capillary 189 (H) 70 - 99 mg/dL    Comment: Glucose reference range applies only to samples taken after fasting for at least 8 hours.  I-STAT 7, (LYTES, BLD GAS, ICA, H+H)     Status: Abnormal   Collection Time: 01/11/23 12:29 PM  Result Value Ref Range   pH, Arterial 7.387 7.35 - 7.45   pCO2 arterial 39.8 32 - 48 mmHg   pO2, Arterial 247 (H) 83 - 108 mmHg   Bicarbonate 24.0 20.0 - 28.0 mmol/L   TCO2 25 22 - 32  mmol/L   O2 Saturation 100 %   Acid-base deficit 1.0 0.0 - 2.0 mmol/L   Sodium 127 (L) 135 - 145 mmol/L   Potassium 3.2 (L) 3.5 - 5.1 mmol/L   Calcium, Ion 1.19 1.15 - 1.40 mmol/L   HCT 26.0 (L) 36.0 - 46.0 %   Hemoglobin 8.8 (L) 12.0 - 15.0 g/dL   Sample type ARTERIAL   POCT Activated clotting time     Status: None   Collection Time: 01/11/23 12:57 PM  Result Value Ref Range   Activated Clotting Time 217 seconds    Comment: Reference range 74-137 seconds for patients not on anticoagulant therapy.  POCT Activated clotting time     Status: None   Collection Time: 01/11/23  1:11 PM  Result Value Ref Range   Activated Clotting Time 277 seconds    Comment: Reference range 74-137 seconds for patients not on anticoagulant therapy.  Glucose, capillary     Status: Abnormal   Collection Time: 01/11/23  2:12 PM  Result Value Ref Range   Glucose-Capillary 133 (H) 70 - 99 mg/dL    Comment: Glucose reference range applies only to samples taken after fasting for at least 8 hours.  Prepare RBC (crossmatch)     Status: None   Collection Time: 01/11/23  4:11 PM  Result Value Ref Range   Order Confirmation      ORDER PROCESSED BY BLOOD BANK Performed at [facility], [address] [place], [place] [address]   Glucose, capillary     Status: Abnormal   Collection Time: 01/11/23  4:53 PM  Result Value Ref Range   Glucose-Capillary 133 (H) 70 - 99 mg/dL    Comment: Glucose reference range applies only to samples taken after fasting for at least 8 hours.  Glucose, capillary     Status: Abnormal   Collection Time: 01/11/23  9:11 PM  Result Value Ref Range   Glucose-Capillary 248 (H) 70 - 99 mg/dL    Comment: Glucose reference range applies only to samples taken after fasting for at least 8 hours.   Comment 1 Notify RN    Comment 2 Document in Chart   CBC     Status: Abnormal   Collection Time: 01/12/23  1:51 AM  Result Value Ref Range   WBC 19.5 (H) 4.0 - 10.5 K/uL   RBC 3.59 (L) 3.87  - 5.11 MIL/uL   Hemoglobin 10.3 (L) 12.0 - 15.0  g/dL    Comment: REPEATED TO VERIFY POST TRANSFUSION SPECIMEN    HCT 30.5 (L) 36.0 - 46.0 %   MCV 85.0 80.0 - 100.0 fL   MCH 28.7 26.0 - 34.0 pg   MCHC 33.8 30.0 - 36.0 g/dL   RDW 16.1 (H) 09.6 - 04.5 %   Platelets 439 (H) 150 - 400 K/uL   nRBC 0.0 0.0 - 0.2 %    Comment: Performed at [facility], [address] [place], [place] [address]  Basic metabolic panel     Status: Abnormal   Collection Time: 01/12/23  1:51 AM  Result Value Ref Range   Sodium 129 (L) 135 - 145 mmol/L   Potassium 4.1 3.5 - 5.1 mmol/L   Chloride 96 (L) 98 - 111 mmol/L   CO2 23 22 - 32 mmol/L   Glucose, Bld 174 (H) 70 - 99 mg/dL    Comment: Glucose reference range applies only to samples taken after fasting for at least 8 hours.   BUN 23 8 - 23 mg/dL   Creatinine, Ser 1.91 (H) 0.44 - 1.00 mg/dL   Calcium 8.3 (L) 8.9 - 10.3 mg/dL   GFR, Estimated 38 (L) >60 mL/min    Comment: (NOTE) Calculated using the CKD-EPI Creatinine Equation (2021)    Anion gap 10 5 - 15    Comment: Performed at [facility], [address] [place], [place] [address]  Lipid panel     Status: Abnormal   Collection Time: 01/12/23  1:51 AM  Result Value Ref Range   Cholesterol 89 0 - 200 mg/dL   Triglycerides 562 <130 mg/dL   HDL 33 (L) >86 mg/dL   Total CHOL/HDL Ratio 2.7 RATIO   VLDL 22 0 - 40 mg/dL   LDL Cholesterol 34 0 - 99 mg/dL    Comment:        Total Cholesterol/HDL:CHD Risk Coronary Heart Disease Risk Table                     Men   Women  1/2 Average Risk   3.4   3.3  Average Risk       5.0   4.4  2 X Average Risk   9.6   7.1  3 X Average Risk  23.4   11.0        Use the calculated Patient Ratio above and the CHD Risk Table to determine the patient's CHD Risk.        ATP III CLASSIFICATION (LDL):  <100     mg/dL   Optimal  578-469  mg/dL   Near or Above                    Optimal  130-159  mg/dL   Borderline  629-528  mg/dL   High  >413      mg/dL   Very High Performed at [facility], [address] [place], [place] [address]   Glucose, capillary     Status: Abnormal   Collection Time: 01/12/23  6:17 AM  Result Value Ref Range   Glucose-Capillary 224 (H) 70 - 99 mg/dL    Comment: Glucose reference range applies only to samples taken after fasting for at least 8 hours.   Comment 1 Notify RN    Comment 2 Document in Chart   Glucose, capillary     Status: Abnormal   Collection Time: 01/12/23 11:20 AM  Result Value Ref  Range   Glucose-Capillary 168 (H) 70 - 99 mg/dL    Comment: Glucose reference range applies only to samples taken after fasting for at least 8 hours.  POCT Urinalysis Dipstick     Status: Abnormal   Collection Time: 01/17/23  5:05 PM  Result Value Ref Range   Color, UA dark red    Clarity, UA clear    Glucose, UA Negative Negative   Bilirubin, UA 1+    Ketones, UA 1+    Spec Grav, UA 1.015 1.010 - 1.025   Blood, UA 3+    pH, UA 5.5 5.0 - 8.0   Protein, UA Positive (A) Negative   Urobilinogen, UA 2.0 (A) 0.2 or 1.0 E.U./dL   Nitrite, UA Positive    Leukocytes, UA Large (3+) (A) Negative   Appearance     Odor    Urine Microscopic     Status: Abnormal   Collection Time: 01/17/23  5:14 PM  Result Value Ref Range   WBC, UA 11-20/hpf (A) 0-2/hpf   RBC / HPF TNTC(>50/hpf) (A) 0-2/hpf    No image results found.   CT ANGIO AO+BIFEM W & OR WO CONTRAST  Result Date: 01/03/2023 CLINICAL DATA:  PAD with gangrene. EXAM: CT ANGIOGRAPHY OF ABDOMINAL AORTA WITH ILIOFEMORAL RUNOFF TECHNIQUE: Multidetector CT imaging of the abdomen, pelvis and lower extremities was performed using the standard protocol during bolus administration of intravenous contrast. Multiplanar CT image reconstructions and MIPs were obtained to evaluate the vascular anatomy. RADIATION DOSE REDUCTION: This exam was performed according to the departmental dose-optimization program which includes automated exposure control, adjustment of the mA  and/or kV according to patient size and/or use of iterative reconstruction technique. CONTRAST:  OMNIPAQUE IOHEXOL 350 MG/ML SOLN COMPARISON:  CT abdomen pelvis-12/08/2022; 06/17/2020 CTA runoff-04/03/2017 FINDINGS: VASCULAR Aorta: There is a moderate amount of slightly irregular mixed calcified and noncalcified atherosclerotic plaque throughout the normal caliber abdominal aorta, not resulting in a hemodynamically significant stenosis. No evidence of abdominal aortic dissection or perivascular stranding. 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 SMA: Widely patent without hemodynamically significant narrowing. As discussed previously, there is retrograde supply to the celiac arterial vascular distribution via the GDA and hypertrophied pancreaticoduodenal arteries. Otherwise, conventional branching pattern. The distal tributaries of the SMA appear widely patent without discrete intraluminal filling defect to suggest distal embolism. Renals: Solitary bilaterally; there is a moderate amount of eccentric calcified and noncalcified atherosclerotic plaque involving the origin of the left renal artery, approaching 50% luminal narrowing. Minimal amount of right-sided atherosclerotic plaque, not resulting in a hemodynamically significant stenosis. No vessel irregularity to suggest FMD. IMA: Remains patent. _________________________________________________________ RIGHT Lower Extremity Inflow: Redemonstrated moderate amount of mixed calcified and noncalcified atherosclerotic plaque throughout the right common, external and internal iliac arteries, not definitely resulting in a hemodynamically significant narrowing, though the common and external iliac arteries are again noted to be atretic. Outflow: Circumferential noncalcified atherosclerotic  plaque involving the distal aspect of the right common femoral artery approaches 50% luminal narrowing (image 126, series 5). There is a minimal amount predominantly noncalcified atherosclerotic plaque involving the proximal aspect of the right superficial femoral artery, not resulting in hemodynamically significant stenosis, though the uncovered portion of the right superficial femoral artery is again noted to be diffusely atretic. The patient has undergone overlapping stent placement of the distal aspect of the right  superficial femoral artery extending to involve the right above knee popliteal artery. The overlapping stents appear widely patent without evidence of in stent stenosis. The right deep femoral artery remains patent though is again noted to be atretic. The right below knee popliteal artery is of normal caliber and widely patent without hemodynamically significant narrowing. Runoff: Three-vessel runoff to the right lower leg and foot. The right-sided dorsalis pedis artery is patent to the level of the forefoot. No discrete intraluminal filling defects to suggest distal embolism. _________________________________________________________ LEFT Lower Extremity Inflow: There is a moderate amount of irregular mixed calcified and noncalcified atherosclerotic plaque throughout the left common, external and internal iliac arteries, not definitely resulting in hemodynamically significant narrowing, though the common and external iliac arteries are again noted to be atretic. Outflow: Moderate amount of atherosclerotic plaque within the left common femoral artery, not resulting in hemodynamically significant stenosis. Post long segment overlapping stenting of the left superficial femoral artery. The stents appear widely patent without definitive evidence of in stent stenosis. The left deep femoral artery remains patent though is again noted to be diffusely atretic. The left above and below-knee popliteal arteries  are of normal caliber and widely patent without a hemodynamically significant narrowing. Runoff: Three-vessel runoff to the left lower leg and foot. The left-sided dorsalis pedis artery is patent to the level of the forefoot. Veins: The IVC and pelvic venous systems appear patent on this arterial phase examination. Review of the MIP images confirms the above findings. _________________________________________________________ NON-VASCULAR Lower chest: Limited visualization of the lower thorax demonstrates minimal dependent subpleural ground-glass atelectasis. Normal heart size.  No pericardial effusion. Hepatobiliary: Normal hepatic contour. No discrete hyperenhancing hepatic lesions. Postcholecystectomy. No intra or extrahepatic biliary duct dilatation. No ascites. Pancreas: Normal appearance of the pancreas. Spleen: Normal appearance of the spleen. Adrenals/Urinary Tract: There is symmetric enhancement of the bilateral kidneys. Mild contour irregularity involving the superior interpolar aspect of the left kidney without discrete lesion. No evidence of nephrolithiasis on this postcontrast examination. No urinary obstruction or perinephric stranding. Thickening of the bilateral adrenal glands without discrete nodule. Diffuse thickening of the urinary bladder wall. Stomach/Bowel: Scattered colonic diverticulosis without evidence superimposed acute diverticulitis. Moderate colonic stool burden without evidence of enteric obstruction. Normal appearance of the terminal ileum and the retrocecal appendix. Nonspecific mild gaseous distention of a loop of small bowel within the right mid abdomen without evidence of enteric obstruction. No significant hiatal hernia. No pneumoperitoneum, pneumatosis or portal venous gas. Lymphatic: No bulky retroperitoneal, mesenteric, pelvic or inguinal lymphadenopathy. Reproductive: Post hysterectomy. No discrete adnexal lesions. No free fluid the pelvic cul-de-sac. Other: Regional soft  tissues appear normal. Musculoskeletal: Severe right-sided hallux valgus deformity. Air within the distal phalanx of the right great toe worrisome for osteomyelitis (image 404, series 5). Post amputation of the right first and second digits as well as the left second digit. Acute angulation and impaction involving the superior aspect of the right femoral head/neck junction (coronal image 81, series 13), is unchanged compared to abdominal CT performed 12/08/2022 though new compared to abdominal CT performed 06/17/2020 and is favored to represent the sequela of remote injury/fracture. Mild-to-moderate multilevel lumbar spine DDD. IMPRESSION: VASCULAR IMPRESSION: 1. Moderate amount of slightly irregular mixed calcified and noncalcified atherosclerotic plaque throughout the normal caliber abdominal aorta, not resulting in a hemodynamically significant stenosis. Aortic Atherosclerosis (ICD10-I70.0). 2. 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 3. Suspected 50% luminal narrowing of the left renal artery without associated asymmetric renal atrophy or delayed renal enhancement. Left lower extremity vascular Impression: 1. Moderate amount of irregular mixed calcified and noncalcified atherosclerotic plaque involving the right common and external iliac arteries as well as the proximal and mid aspects of the right superficial femoral artery, not resulting in a hemodynamically significant narrowing though these vessels are again noted to be diffusely atretic. 2. Post long segment overlapping stenting of the left superficial femoral artery, without evidence of a residual or recurrent hemodynamically significant narrowing. 3. Three-vessel runoff to the left lower leg and foot. The left-sided dorsalis pedis artery is patent to the level of the forefoot. Right  lower extremity vascular Impression: 1. Moderate amount of irregular mixed calcified and noncalcified atherosclerotic plaque involving the left common and external arteries, not resulting in a hemodynamically significant narrowing, though these vessels are again noted to be diffusely atretic. 2. Post overlapping stent placement of the distal aspect of the right superficial femoral artery extending to involve the right above knee popliteal artery, without evidence of a residual or recurrent hemodynamically significant narrowing. 3. Three-vessel runoff to the right lower leg and foot. The right-sided dorsalis pedis artery is patent to the level of the forefoot. No discrete intraluminal filling defects to suggest distal embolism. NONVASCULAR IMPRESSION: 1. Air within the distal phalanx of the right great toe worrisome for osteomyelitis. Further evaluation with MRI could as indicated. 2. Post amputation of the right first and second digits as well as the left second digit. 3. Acute angulation and impaction involving the superior aspect of the right femoral head/neck junction, unchanged compared to abdominal CT performed 12/08/2022 though new compared to abdominal CT performed 06/17/2020 favored to represent the sequela remote though age-indeterminate fracture. Clinical correlation is advised. Electronically Signed   By: Simonne Come M.D.   On: 01/03/2023 17:05   VAS [place] LOWER EXTREMITY ARTERIAL DUPLEX  Result Date: 12/28/2022 LOWER EXTREMITY ARTERIAL DUPLEX STUDY Patient Name:  KEDRA MCGLADE Knodel  Date of Exam:   12/27/2022 Medical Rec #: [id number]      Accession #:    [id number] Date of Birth: 1955/12/05     Patient Gender: F Patient Age:   35 years Exam Location:  [facility] Procedure:      VAS [place] LOWER EXTREMITY ARTERIAL DUPLEX Referring Phys: Heath Lark --------------------------------------------------------------------------------  Indications: Follow up right sfa stent(12/18/22) and left fem-pop  stent              (12/15/22).  Vascular Interventions: 12/15/22 abdominal aortogram w/ lower extremity and left                         fem-pop stenting                         12/18/22 abdominal aortogram w/ lower extremity and right                         SFA stenting                         05/19/2021 Right Popliteal stenting and angioplasty                         (5x138mm [place]) Anterior tibial angioplasty (3x50mm  [place]). Current ABI:            RT 0.86 LT 0.66 Comparison Study: 09/07/21 Performing Technologist: [place] Ponder RVS  Examination Guidelines: A complete evaluation includes B-mode imaging, spectral Doppler, color Doppler, and power Doppler as needed of all accessible portions of each vessel. Bilateral testing is considered an integral part of a complete examination. Limited examinations for reoccurring indications may be performed as noted.  +-----------+--------+-----+---------------+--------+--------+ RIGHT      PSV cm/sRatioStenosis       WaveformComments +-----------+--------+-----+---------------+--------+--------+ CFA Prox   184          30-49% stenosisbiphasic         +-----------+--------+-----+---------------+--------+--------+ DFA        210          50-74% stenosisbiphasic         +-----------+--------+-----+---------------+--------+--------+ SFA Prox   379          50-74% stenosisbiphasic         +-----------+--------+-----+---------------+--------+--------+ POP Prox   78                          biphasic         +-----------+--------+-----+---------------+--------+--------+ TP Trunk   79                          biphasic         +-----------+--------+-----+---------------+--------+--------+ ATA Distal 87                          biphasic         +-----------+--------+-----+---------------+--------+--------+ PTA Distal 59                          biphasic          +-----------+--------+-----+---------------+--------+--------+ PERO Distal53                          biphasic         +-----------+--------+-----+---------------+--------+--------+  Right Stent(s): +---------------+--------+--------+--------+--------+ SFA stent      PSV cm/sStenosisWaveformComments +---------------+--------+--------+--------+--------+ Prox to Stent  125             biphasic         +---------------+--------+--------+--------+--------+ Proximal Stent 128             biphasic         +---------------+--------+--------+--------+--------+ Mid Stent      63              biphasic         +---------------+--------+--------+--------+--------+ Distal Stent   67              biphasic         +---------------+--------+--------+--------+--------+ Distal to Stent63              biphasic         +---------------+--------+--------+--------+--------+    +-----------+--------+-----+---------------+----------+--------+ LEFT       PSV cm/sRatioStenosis       Waveform  Comments +-----------+--------+-----+---------------+----------+--------+ CFA Mid    653          75-99% stenosisbiphasic           +-----------+--------+-----+---------------+----------+--------+ DFA        65                          monophasic         +-----------+--------+-----+---------------+----------+--------+  POP Distal 84                          monophasic         +-----------+--------+-----+---------------+----------+--------+ TP Trunk   81                          monophasic         +-----------+--------+-----+---------------+----------+--------+ ATA Distal 51                          monophasic         +-----------+--------+-----+---------------+----------+--------+ PTA Distal 58                          monophasic         +-----------+--------+-----+---------------+----------+--------+ PERO Distal38                          monophasic          +-----------+--------+-----+---------------+----------+--------+  Left Stent(s): +---------------+--------+--------+----------+--------+ fem- pop stent PSV cm/sStenosisWaveform  Comments +---------------+--------+--------+----------+--------+ Prox to Stent  63              monophasic         +---------------+--------+--------+----------+--------+ Proximal Stent 52              monophasic         +---------------+--------+--------+----------+--------+ Mid Stent      54              monophasic         +---------------+--------+--------+----------+--------+ Distal Stent   45              monophasic         +---------------+--------+--------+----------+--------+ Distal to Stent57              monophasic         +---------------+--------+--------+----------+--------+    Summary: Right: 30-49% stenosis noted in the common femoral artery. 50-74% stenosis noted in the deep femoral artery. 50-74% stenosis noted in the proximal superficial femoral artery. Patent stent with no evidence of stenosis in the superficial femoral artery artery. Left: 75-99% stenosis noted in the common femoral artery. Patent stent with no evidence of stenosis in the femoral- popliteal artery.  See table(s) above for measurements and observations. Electronically signed by Waverly Ferrari MD on 12/28/2022 at 8:32:56 AM.    Final    VAS [place] ABI WITH/WO TBI  Result Date: 12/28/2022  LOWER EXTREMITY DOPPLER STUDY Patient Name:  ASHLIN KREPS  Date of Exam:   12/27/2022 Medical Rec #: [id number]      Accession #:    [id number] Date of Birth: 09/13/1956     Patient Gender: F Patient Age:   64 years Exam Location:  [facility] Procedure:      VAS [place] ABI WITH/WO TBI Referring Phys: Heath Lark --------------------------------------------------------------------------------  Indications: Ulceration, and peripheral artery disease. post op bilateral stent High Risk Factors: Hypertension,  hyperlipidemia, Diabetes.  Vascular Interventions: 12/15/22 abdominal aortogram w/ lower extremity and left                         SFA stenting                         12/18/22 abdominal aortogram w/ lower extremity  and right                         SFA stenting                         06/01/2021 Right great toe and second toe amputation.                         05/19/2021 Right popliteal angioplasty and stenting. Comparison Study: 12/05/22 prior Rt 0.2 Lt absent Performing Technologist: Argentina Ponder RVS  Examination Guidelines: A complete evaluation includes at minimum, Doppler waveform signals and systolic blood pressure reading at the level of bilateral brachial, anterior tibial, and posterior tibial arteries, when vessel segments are accessible. Bilateral testing is considered an integral part of a complete examination. Photoelectric Plethysmograph (PPG) waveforms and toe systolic pressure readings are included as required and additional duplex testing as needed. Limited examinations for reoccurring indications may be performed as noted.  ABI Findings: +---------+------------------+-----+--------+---------+ Right    Rt Pressure (mmHg)IndexWaveformComment   +---------+------------------+-----+--------+---------+ Brachial 143                                      +---------+------------------+-----+--------+---------+ PTA      134               0.86 biphasic          +---------+------------------+-----+--------+---------+ DP       129               0.83 biphasic          +---------+------------------+-----+--------+---------+ Great Toe                               amputated +---------+------------------+-----+--------+---------+ +---------+------------------+-----+----------+-------+ Left     Lt Pressure (mmHg)IndexWaveform  Comment +---------+------------------+-----+----------+-------+ Brachial 155                                       +---------+------------------+-----+----------+-------+ PTA      103               0.66 monophasic        +---------+------------------+-----+----------+-------+ DP       88                0.57 biphasic          +---------+------------------+-----+----------+-------+ Great Toe                       Absent            +---------+------------------+-----+----------+-------+ +-------+-----------+-----------+------------+------------+ ABI/TBIToday's ABIToday's TBIPrevious ABIPrevious TBI +-------+-----------+-----------+------------+------------+ Right  0.86       amputated  0.22        amputated    +-------+-----------+-----------+------------+------------+ Left   0.66       absent     absent      absent       +-------+-----------+-----------+------------+------------+ Bilateral ABIs appear increased compared to prior study on 12/05/22.  Summary: Right: Resting right ankle-brachial index indicates mild right lower extremity arterial disease. Left: Resting left ankle-brachial index indicates moderate left lower extremity arterial disease. *See table(s) above for measurements and observations.  Electronically signed by Waverly Ferrari MD on 12/28/2022  at 8:26:44 AM.    Final    PERIPHERAL VASCULAR CATHETERIZATION  Result Date: 12/19/2022 Table formatting from the original result was not included. PATIENT: CHANDRIA ROOKSTOOL      MRN: [id number] DOB: [birth year]    DATE OF PROCEDURE: 12/18/2022 INDICATIONS:  KALEIA LONGHI is a 67 y.o. female who had presented with critical limb ischemia bilaterally.  She underwent left lower extremity intervention last week and presents now for intervention on the right for disease in the superficial femoral artery PROCEDURE:  Ultrasound-guided access to the left common femoral artery Selective catheterization of the right superficial femoral artery (third order catheterization) Right lower extremity arteriogram Angioplasty and stenting of the right  superficial femoral artery Drug-coated balloon angioplasty of in stent stenosis of previous right femoral stent. SURGEON: Di Kindle. Edilia Bo, MD, FACS ANESTHESIA: Local EBL: Minimal TECHNIQUE: The patient was brought to the peripheral vascular lab and was not sedated as she was already somewhat sedated.  The patient's heart rate, blood pressure, and oxygen saturation were monitored by the nurse continuously during the procedure. Both groins were prepped and draped in the usual sterile fashion.  Under ultrasound guidance, after the skin was anesthetized, I cannulated the left common femoral artery with a micropuncture needle and a micropuncture sheath was introduced over a wire.  This was exchanged for a 5 [place] sheath over a Bentson wire.  By ultrasound the femoral artery was patent. A real-time image was obtained and sent to the server. I advanced the Glidewire advantage into the infrarenal aorta and the Omni catheter was placed at the bifurcation and then positioned into the right common iliac artery.  The wire was advanced down into the external iliac artery.  I then exchanged the 5 French sheath for a 6 French 45 cm catapulted sheath.  This was initially positioned in the common femoral artery.  Right lower extremity arteriograms obtained demonstrating the areas of concern in the superficial femoral artery.  I then advanced the wire into the superficial femoral artery and advanced the sheath using the dilator into the superficial femoral artery.  The patient was heparinized.  ACT was monitored throughout the procedure. There were 2 tandem stenoses in the superficial femoral artery above the previous stent.  The proximal stenosis was about 50% and the more distal stenosis was a focal 90% stenosis.  I elected to address this with drug-coated balloon angioplasty.  A 5 mm x 60 mm Zilver stent was selected and positioned across the 2 stenoses and deployed without difficulty.  Postdilatation was done with a 4 x  60 balloon.  There was an excellent result.  I then addressed the in stent stenoses within the previous placed stent with a Impact drug-coated balloon.  This was a 4 mm x 120 mm balloon.  This was inflated to nominal pressure for 3 minutes.  Completion film showed excellent result.  However I was not happy with the area between the 2 stents and elected to address this with a second stent.  And an oval 5 x 40 stent was selected and deployed overlapping both previous stents without difficulty.  Postdilatation was done with the 4 mm x 60 mm balloon which was inflated to nominal pressure for 1 minute.  Completion film showed an excellent result.  We then did runoff which showed no disease distally. At the completion of the procedure the sheath was retracted into the external iliac artery on the left and removed over a wire.  A Perclose device was placed  twice but at both times the suture broke and was not successful and we therefore replaced this with a 7 [place] sheath.  This will be removed manually.  Patient tolerated the procedure well was transferred to the holding area in stable condition. FINDINGS: There is mild disease in the right common femoral artery.  The deep femoral artery is patent.  The superficial femoral artery is patent proximally but then had 2 tandem stenoses in the proximal to mid thigh.  The proximal stenosis was about 50% and there was a focal 90% stenosis distal to that.  These were addressed with drug-coated balloon angioplasty as described above.  ` The in-stent stenoses were addressed with a 4 x 120 Impact balloon as described above.  There was no residual stenosis.  The area between the 2 is stents was addressed with no residual stenosis. Distally, the popliteal artery, anterior tibial, posterior tibial, peroneal and tibial peroneal trunk arteries were patent. Waverly Ferrari, MD, FACS [facility]   PERIPHERAL VASCULAR CATHETERIZATION  Result Date:  12/15/2022 DATE OF SERVICE: 12/15/2022  PATIENT:  [name]  67 y.o. 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 Lenell Antu, MD  ASSISTANT: none  ANESTHESIA:   local and IV sedation  ESTIMATED BLOOD LOSS: minimal  LOCAL MEDICATIONS USED:  LIDOCAINE  COUNTS: confirmed correct.  PATIENT DISPOSITION:  PACU - hemodynamically stable.  Delay start of Pharmacological VTE agent (>24hrs) due to surgical blood loss or risk of bleeding: no  INDICATION FOR PROCEDURE: ARIEANNA PRESSEY is a 67 y.o. female with bilateral lower extremity gangrene. After careful discussion of risks, benefits, and alternatives the patient was offered angiography. The patient understood and wished to proceed.  OPERATIVE FINDINGS: Globally diminutive arteries Healthy femoropopliteal vessels measuring ~34mm Tibial vessels measuring <18mm  Terminal aorta and iliac arteries: Patent  Right lower extremity: Common femoral artery: patent Profunda femoris artery: patent Superficial femoral artery: diffuse disease; multifocal severe stenosis greatest 70%; existing femoropopliteal stenting patent but with instent stenosis ~ 40%. Popliteal artery: patent above, behind and below knee Anterior tibial artery: patent Tibioperoneal trunk: patent Peroneal artery: patent Posterior tibial artery: patent Pedal circulation: disadvantaged  Left lower extremity: Common femoral artery: patent Profunda femoris artery: patent Superficial femoral artery: diffuse disease; proximal significant stenosis ~60%. Chronic total occlusion about [place]  [place]al about 30mm with severe disease in reconstituted above knee popliteal artery. Popliteal artery: Remainder of popliteal artery without significant stenosis. Anterior tibial artery: patent Tibioperoneal trunk: patent Peroneal artery: patent Posterior tibial artery: patent Pedal circulation: disadvantaged  GLASS score. FP 3. IP 0. Stage II.  WIfI score. 2 / 3 /1. Stage IV.  DESCRIPTION OF PROCEDURE: After identification of the patient in the pre-operative holding area, the patient was transferred to the operating room. The patient was positioned supine on the operating room table.  Anesthesia was induced. The groins was prepped and draped in standard fashion. A surgical pause was performed confirming correct patient, procedure, and operative location.  The right groin was anesthetized with subcutaneous injection of 1% lidocaine. Using ultrasound guidance, the right common femoral artery was accessed with micropuncture technique.  Fluoroscopy was used to confirm cannulation over the femoral head. The 18F sheath was upsized to 115F.  A Benson wire was advanced into the distal aorta. Over the wire an omni flush catheter was advanced to the level of L2. Aortogram was performed - see above for details.  The left common iliac artery was selected with an omniflush catheter and glidewire advantage guidewire. The wire was advanced into the common femoral artery. Over the wire the omni flush catheter was advanced into the external iliac artery. Selective angiography was performed - see above for details.  The decision was made to intervene. The patient was heparinized with 6000 units of heparin. The 115F sheath was exchanged for a 15F x 45cm sheath. Selective angiography of the left lower extremity was performed prior to intervention.  The lesions were treated with: Left femoropopliteal angioplasty and stenting (5x169mm, 5x153mm, 5x105mm Zilver)  Completion angiography revealed: Resolution of L SFA / popliteal stenosis and  occlusion.  A perclose device was used to close the arteriotomy. Hemostasis was adequate upon completion.  Conscious sedation was administered with the use of IV fentanyl and midazolam under continuous physician and nurse monitoring.  Heart rate, blood pressure, and oxygen saturation were continuously monitored.  Total sedation time was 47 minutes  Upon completion of the case instrument and sharps counts were confirmed correct. The patient was transferred to the PACU in good condition. I was present for all portions of the procedure.  PLAN: ASA 81mg  PO QD. Plavix 75mg  PO QD. High intensity statin therapy. Plan angiography Monday with focus on R leg.  Rande Brunt. Lenell Antu, MD [facility] Phone Number: [phone] 12/15/2022 10:51 AM    DG Foot Complete Left  Result Date: 12/13/2022 CLINICAL DATA:  Dry gangrene. EXAM: LEFT FOOT - COMPLETE 3+ VIEW COMPARISON:  Foot x-rays 11/06/2022 FINDINGS: Patient is status post second and fifth toe amputation the level of the MTP joints. No evidence for an acute fracture or dislocation. No bony lysis to suggest osteomyelitis. IMPRESSION: Stable.  No new or progressive interval findings. Electronically Signed   By: Kennith Center M.D.   On: 12/13/2022 19:03   CT ABDOMEN PELVIS W CONTRAST  Result Date: 12/08/2022 CLINICAL DATA:  Acute generalized abdominal pain. EXAM: CT ABDOMEN AND PELVIS WITH CONTRAST TECHNIQUE: Multidetector CT imaging of the abdomen and pelvis was performed using the standard protocol following bolus administration of intravenous contrast. RADIATION DOSE REDUCTION: This exam was performed according to the departmental dose-optimization program which includes automated exposure control, adjustment of the mA and/or kV according to patient size and/or use of iterative reconstruction technique. CONTRAST:  75mL OMNIPAQUE IOHEXOL 350 MG/ML SOLN COMPARISON:  June 17, 2020. FINDINGS: Lower chest: No acute abnormality.  Hepatobiliary: No focal liver abnormality is seen. Status post cholecystectomy. No biliary dilatation. Pancreas: Unremarkable. No pancreatic ductal dilatation or surrounding inflammatory changes. Spleen: Normal in size without focal abnormality. Adrenals/Urinary Tract: Adrenal glands are unremarkable. Kidneys are normal, without renal calculi, focal lesion, or hydronephrosis. Bladder is unremarkable. Stomach/Bowel: Stomach is within normal limits. Appendix appears normal. No evidence of bowel wall thickening, distention, or inflammatory changes. Vascular/Lymphatic: Aortic atherosclerosis. No enlarged abdominal or pelvic lymph nodes. Reproductive: Status post hysterectomy. No adnexal masses. Other: No abdominal wall hernia or abnormality. No abdominopelvic ascites. Musculoskeletal: No acute or significant osseous findings. IMPRESSION: No acute abnormality seen in the abdomen or pelvis. Aortic Atherosclerosis (ICD10-I70.0). Electronically Signed   By: Lupita Raider M.D.   On: 12/08/2022  13:33   VAS [place] ABI WITH/WO TBI  Result Date: 12/05/2022  LOWER EXTREMITY DOPPLER STUDY Patient Name:  MIXTLI RENO  Date of Exam:   12/05/2022 Medical Rec #: [id number]      Accession #:    [id number] Date of Birth: 11-Aug-1956     Patient Gender: F Patient Age:   46 years Exam Location:  [facility] Procedure:      VAS [place] ABI WITH/WO TBI Referring Phys: --------------------------------------------------------------------------------  Indications: Ulceration, and peripheral artery disease. Ulceration of the toes              of the left foot. Patient reports ulcers have been present              approximately 10 days. High Risk Factors: Current smoker.  Vascular Interventions: 06/01/2021                         Right great toe and second toe amputation.                         05/19/2021 Right popliteal angioplasty and stenting. Performing Technologist: Dorthula Matas RVS, RCS  Examination Guidelines: A complete  evaluation includes at minimum, Doppler waveform signals and systolic blood pressure reading at the level of bilateral brachial, anterior tibial, and posterior tibial arteries, when vessel segments are accessible. Bilateral testing is considered an integral part of a complete examination. Photoelectric Plethysmograph (PPG) waveforms and toe systolic pressure readings are included as required and additional duplex testing as needed. Limited examinations for reoccurring indications may be performed as noted.  ABI Findings: +---------+------------------+-----+-------------------+----------+ Right    Rt Pressure (mmHg)IndexWaveform           Comment    +---------+------------------+-----+-------------------+----------+ PTA      33                0.22 dampened monophasic           +---------+------------------+-----+-------------------+----------+ DP       30                0.20 dampened monophasic           +---------+------------------+-----+-------------------+----------+ Great Toe                                          amputation +---------+------------------+-----+-------------------+----------+ +---------+------------------+-----+--------+-------+ Left     Lt Pressure (mmHg)IndexWaveformComment +---------+------------------+-----+--------+-------+ Brachial 151                                    +---------+------------------+-----+--------+-------+ PTA                             absent          +---------+------------------+-----+--------+-------+ DP                              absent          +---------+------------------+-----+--------+-------+ Great Toe                       Absent          +---------+------------------+-----+--------+-------+ +-------+-----------+-----------+------------+------------+ ABI/TBIToday's ABIToday's TBIPrevious ABIPrevious TBI +-------+-----------+-----------+------------+------------+ Right  0.22       amputation  0.90        amputation   +-------+-----------+-----------+------------+------------+ Left   absent     absent     0.97                     +-------+-----------+-----------+------------+------------+  Bilateral ABIs appear decreased compared to prior study on 09/06/2021.  Summary: Right: Resting right ankle-brachial index indicates critical limb ischemia. Left: No Doppler flow detected in the posterior tibial or dorsalis pedis artery.  *See table(s) above for measurements and observations.  Electronically signed by Coral Else MD on 12/05/2022 at 10:16:39 PM.    Final    MR FOOT LEFT WO CONTRAST  Result Date: 11/06/2022 CLINICAL DATA:  Evaluate for left foot osteomyelitis EXAM: MRI OF THE LEFT FOOT WITHOUT CONTRAST TECHNIQUE: Multiplanar, multisequence MR imaging of the left foot was performed. No intravenous contrast was administered. COMPARISON:  Radiograph dated November 06, 2022 FINDINGS: Bones/Joint/Cartilage Marked hallux valgus deformity. Prior amputation at the second and fifth metatarsophalangeal joints. Mild degenerative changes at the first metatarsophalangeal and interphalangeal joints. Ligaments Lisfranc and collateral ligaments are intact. Muscles and Tendons Plantar muscles and flexor and extensor tendons within normal limits. No evidence of tendon tear. Soft tissues Subcutaneous soft tissue edema about the dorsum of the foot without evidence of fluid collection or abscess. IMPRESSION: 1. Subcutaneous soft tissue edema about the dorsum of the foot without evidence of fluid collection or abscess. 2. No evidence of osteomyelitis. 3. Marked hallux valgus deformity. 4. Prior amputation at the second and fifth metatarsophalangeal joints. Electronically Signed   By: Larose Hires D.O.   On: 11/06/2022 22:02   DG Foot Complete Left  Result Date: 11/06/2022 CLINICAL DATA:  Left foot pain. EXAM: LEFT FOOT - COMPLETE 3+ VIEW COMPARISON:  Radiographs 03/28/2019 FINDINGS: Prior resection of the second  and fifth toes. Hallux valgus deformity with moderate osteoarthritis of the first metatarsal phalangeal joint. No fracture. No erosion, periostitis or bony destruction. There is a plantar calcaneal spur. Slight soft tissue edema over the dorsum of the foot. No soft tissue gas or radiopaque foreign body. IMPRESSION: 1. No acute findings.  Prior resection of the second and fifth toes. 2. 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 3. Mild soft tissue edema over the dorsum of the foot. Electronically Signed   By: Narda Rutherford M.D.   On: 11/06/2022 17:15   DG CHEST PORT 1 VIEW  Result Date: 10/29/2022 CLINICAL DATA:  Hypoxemia. EXAM: PORTABLE CHEST 1 VIEW COMPARISON:  10/26/2022 FINDINGS: The heart size and mediastinal contours are within normal limits. Increased pleural-parenchymal opacity is seen in the lateral left lung base, which may be due to atelectasis or infiltrate. Right lung remains clear. IMPRESSION: Increased left basilar atelectasis versus infiltrate. Electronically Signed   By: Danae Orleans M.D.   On: 10/29/2022 09:55   DG Chest Port 1 View  Result Date: 10/26/2022 CLINICAL DATA:  SOB EXAM: PORTABLE CHEST 1 VIEW COMPARISON:  10/24/20 CXR FINDINGS: No pleural effusion. No pneumothorax. Normal cardiac and mediastinal contours. Bibasilar atelectasis. Visualized upper abdomen is unremarkable. No displaced rib fractures. IMPRESSION: Bibasilar atelectasis. Electronically Signed   By: Lorenza Cambridge M.D.   On: 10/26/2022 14:19     --------------------------------    Signed: Lula Olszewski, MD 01/18/2023 12:47 PM

## 2023-01-17 NOTE — Telephone Encounter (Signed)
Noted  

## 2023-01-17 NOTE — Telephone Encounter (Signed)
Received Phone call from pts spouse . States pt recently had surgery and is now having blood in urine . Pt sent to triage nurse .

## 2023-01-17 NOTE — Telephone Encounter (Signed)
Called patient back - seeing Dr Jon Billings at [phone] this afternoon -   Patient was offered sooner apts at different md office patient declined - wants to be seen at the [facility] location-

## 2023-01-17 NOTE — Patient Instructions (Addendum)
It was a pleasure seeing you today!  Your health and satisfaction are my top priorities. If you believe your experience today was worthy of a 5-star rating, I would be grateful for your feedback! Lula Olszewski, MD   Situations that should prompt an emergency room visit include: Signs of sepsis, such as fever, chills, rapid heart rate, or confusion. Inability to urinate or severe pain, indicating potential urinary retention or severe infection. Significant bleeding or clots in the urine causing obstruction. Regarding your medications, aspirin and Plavix (clopidogrel) are anticoagulants that increase the risk of bleeding. Given your severe bloody urine, we did a review of the risk-benefit ratio of continuing these medications, especially in the context of her UTI and potential for urinary tract trauma in the context of recent stenting, and we agreed the risks of bleeding are less than the risks of stent restenosis with discontinuing these medications  Therefore you also need to go to emergency room if you become anemic: Now that you have been diagnosed with worsening anemia, it's important to be aware of the following symptoms that may indicate a need for immediate medical attention:  Extreme fatigue or weakness Shortness of breath Rapid or irregular heartbeat Chest pain Dizziness or lightheadedness Pale or yellowish skin Cold hands and feet Headaches  Emergency symptoms specific to anemia include: A feeling that you're going to pass out Low blood pressure Low blood oxygen saturation, which may be measured with a pulse oximeter at home3 Loss of consciousness Please advise the patient to seek emergency care if they experience any of these symptoms, or any other symptoms that cause concern.  It's always better to be safe and get checked out by a healthcare professional if you are unsure.    When to recheck the anemia depends on how you are feeling... but if you feel fine and dont see  any significant bleeding, then I'd suggest doing it in about a month.  In the meantime be sure to consistently check toilet to assess your bleeding and discuss whether to take blood thinners with me.       [x]    I always recommend close follow up (within 1-2 weeks) with your Primary Care Provider (PCP) for any acute problems.  If you are not doing well:  Return to the office sooner.  If your condition begins worsening or become severe or you can't get a sooner appointment :  go to the emergency room.  [x]    Please carefully review your clinical instructions and notes on MyChart (they will be finished later)   [x]    X-rays can be obtained at the  [facility] office. You can walk in M-F between 8:30 am- noon or 1pm-5pm. Tell them you are there for X-rays ordered by me. They will send me the results, then I will let you know the results with instructions. Address: [address].  The Xray department is located in the basement.      [x]    Labs that cannot be completed today should be scheduled at checkout or call [phone] to schedule.  Today's draft of the physician documented plan for today's visit: (final revisions will be visible on MyChart chart later)  Gross hematuria -     Urine Culture; Future -     CBC; Future -     Iron, TIBC and Ferritin Panel; Future -     Comprehensive metabolic panel; Future -     Urine Microscopic  Hematuria, unspecified type -  POCT urinalysis dipstick  Burning with urination -     POCT urinalysis dipstick  Acute cystitis with hematuria -     Urine Culture; Future -     CBC; Future -     Iron, TIBC and Ferritin Panel; Future -     Comprehensive metabolic panel; Future -     Urine Microscopic -     Ciprofloxacin HCl; Take 1 tablet (500 mg total) by mouth 2 (two) times daily for 10 days.  Dispense: 20 tablet; Refill: 0     QUESTIONS & CONCERNS: CLINICAL: please contact us via phone [phone] OR MyChart messaging  LAB &  IMAGING:   We will call you if the results are significantly abnormal or you don't use MyChart.  Most normal results will be posted to MyChart immediately and have a clinical review message by Dr. Jon Billings posted within 2-3 business days.   If you have not heard from [place] regarding the results in 2 weeks OR if you need priority reporting, please contact this office. MYCHART:  The fastest way to get your results and easiest way to stay in touch with [place] is by activating your My Chart account. Instructions are located on the last page of this paperwork.  BILLING: xray and lab orders are billed from separate companies and questions./concerns should be directed to the invoicing company.  For visit charges please discuss with our administrative services. COMPLAINTS:  please let Dr. Jon Billings know or see the [facility] Administrator - Edwena Felty or [facility], by asking at the front desk: we want you to be satisfied with every experience and we would be grateful for the opportunity to address any problems.

## 2023-01-17 NOTE — Telephone Encounter (Signed)
Husband called stating at 2:30 am patient had urinated and had blood present, then again at 8:00 am with this time the blood appearing  to be "worse". I spoke to [facility] PA and was instructed to have patient  contact PCP to have a urinalysis screening for a UTI. If urinalysis is negative for UTI it will be discussed with Dr Lenell Antu on the possibility of taking patient off Plavix.  Mr Nest voiced understanding of the instructions.

## 2023-01-17 NOTE — Telephone Encounter (Signed)
Debbie, Chief Technology Officer, states patient needs to be seen within 4 hours. Informed me that Reygan informed her there are no openings with PCP. I informed Eunice Blase that there are no more openings for the day. Debbie verbalized understanding and told me she would tell pt to go to UC/ED.

## 2023-01-18 ENCOUNTER — Encounter: Payer: Self-pay | Admitting: Internal Medicine

## 2023-01-18 LAB — URINALYSIS, MICROSCOPIC ONLY

## 2023-01-19 LAB — URINE CULTURE
MICRO NUMBER:: 14837419
SPECIMEN QUALITY:: ADEQUATE

## 2023-01-21 ENCOUNTER — Encounter: Payer: Self-pay | Admitting: Physician Assistant

## 2023-01-21 DIAGNOSIS — F411 Generalized anxiety disorder: Secondary | ICD-10-CM

## 2023-01-22 MED ORDER — ALPRAZOLAM 0.5 MG PO TABS: ORAL_TABLET | ORAL | 2 refills | Status: DC

## 2023-01-22 NOTE — Telephone Encounter (Signed)
Pt requesting refill for Alprazolam 0.5 mg. Last OV 01/16/2023.

## 2023-01-23 ENCOUNTER — Ambulatory Visit (INDEPENDENT_AMBULATORY_CARE_PROVIDER_SITE_OTHER): Payer: PPO

## 2023-01-23 VITALS — Wt 125.0 lb

## 2023-01-23 DIAGNOSIS — Z0000 Encounter for general adult medical examination without abnormal findings: Secondary | ICD-10-CM | POA: Diagnosis not present

## 2023-01-23 NOTE — Progress Notes (Signed)
I connected with  Tammy Mcpherson on 01/23/23 by a audio enabled telemedicine application and verified that I am speaking with the correct person using two identifiers.along with Husband wade   Patient Location: Home  Provider Location: Office/Clinic  I discussed the limitations of evaluation and management by telemedicine. The patient expressed understanding and agreed to proceed.    Patient Medicare AWV questionnaire was completed by the patient on 01/22/23; I have confirmed that all information answered by patient is correct and no changes since this date.     Subjective:   Tammy Mcpherson is a 67 y.o. female who presents for an Initial Medicare Annual Wellness Visit.  Review of Systems     Cardiac Risk Factors include: advanced age (>75men, >78 women);hypertension;dyslipidemia;diabetes mellitus     Objective:    Today's Vitals   01/23/23 1558  Weight: 125 lb (56.7 kg)   Body mass index is 20.8 kg/m.     12/13/2022    5:25 PM 12/08/2022   11:10 AM 12/08/2022   10:18 AM 11/06/2022    4:38 PM 10/26/2022    3:49 PM 08/17/2022    3:03 PM 08/10/2022    2:20 PM  Advanced Directives  Does Patient Have a Medical Advance Directive? No No No No No No No  Type of Advance Directive      Living will;Healthcare Power of Attorney   Does patient want to make changes to medical advance directive?      No - Patient declined No - Patient declined  Copy of Healthcare Power of Attorney in Chart?      No - copy requested   Would patient like information on creating a medical advance directive? No - Patient declined  No - Patient declined No - Patient declined No - Patient declined No - Patient declined No - Patient declined    Current Medications (verified) Outpatient Encounter Medications as of 01/23/2023  Medication Sig   Acetaminophen 500 MG capsule Take 500-1,000 mg by mouth every 6 (six) hours as needed for pain (or headaches).   albuterol (PROVENTIL) (2.5 MG/3ML) 0.083% nebulizer solution Take  3 mLs (2.5 mg total) by nebulization every 4 (four) hours as needed for wheezing or shortness of breath.   albuterol (VENTOLIN HFA) 108 (90 Base) MCG/ACT inhaler INHALE TWO PUFFS INTO THE LUNGS EVERY SIX HOURS AS NEEDED FOR WHEEZING OR SHORTNESS OF BREATH   ALPRAZolam (XANAX) 0.5 MG tablet TAKE ONE TABLET (0.5MG  TOTAL) BY MOUTH DAILY AS NEEDED FOR ANXIETY   aspirin 81 MG chewable tablet Chew 1 tablet (81 mg total) by mouth every morning.   atorvastatin (LIPITOR) 80 MG tablet Take 1 tablet (80 mg total) by mouth daily. (Patient taking differently: Take 80 mg by mouth at bedtime.)   blood glucose meter kit and supplies Dispense based on patient and insurance preference. Use up to four times daily as directed. (FOR ICD-10 E10.9, E11.9).   Cholecalciferol (VITAMIN D) 50 MCG (2000 UT) CAPS Take 1 capsule (2,000 Units total) by mouth daily.   ciprofloxacin (CIPRO) 500 MG tablet Take 1 tablet (500 mg total) by mouth 2 (two) times daily for 10 days.   clopidogrel (PLAVIX) 75 MG tablet Take 1 tablet (75 mg total) by mouth daily. TAKE ONE TABLET (75MG  TOTAL) BY MOUTH DAILY WITH BREAKFAST   Continuous Blood Gluc Receiver (FREESTYLE LIBRE 2 READER) DEVI Check blood glucose up to 4 times daily   Continuous Blood Gluc Sensor (FREESTYLE LIBRE 2 SENSOR) MISC Apply one sensor to  the upper arm avery 14 days. (Patient taking differently: Inject 1 patch into the skin See admin instructions. Apply one sensor into the upper arm every 14 days)   cyanocobalamin (VITAMIN B12) 1000 MCG/ML injection INJECT 1 ML INTO THE MUSCLE EVERY 30 DAYS. (Patient taking differently: Inject 1,000 mcg into the skin every 30 (thirty) days.)   cyclobenzaprine (FLEXERIL) 5 MG tablet Take 1 tablet (5 mg total) by mouth 3 (three) times daily as needed for muscle spasms.   dicyclomine (BENTYL) 10 MG capsule Take 1 capsule (10 mg total) by mouth 2 (two) times daily as needed for spasms.   FLUoxetine (PROZAC) 40 MG capsule Take 1 capsule (40 mg total)  by mouth daily.   gabapentin (NEURONTIN) 300 MG capsule Take 1 capsule (300 mg total) by mouth 3 (three) times daily.   glucose blood (ONETOUCH ULTRA) test strip Use as instructed   glucose monitoring kit (FREESTYLE) monitoring kit Use to check blood sugar BID as directed   Lancets (ONETOUCH DELICA PLUS LANCET33G) MISC 1 each by Does not apply route 2 (two) times a day.   metFORMIN (GLUCOPHAGE-XR) 500 MG 24 hr tablet Take 1 tablet (500 mg total) by mouth daily with breakfast. (Patient taking differently: Take 500 mg by mouth daily as needed (high blood sugar).)   Misc. Devices MISC Please provide supplies (needle, syringe, alcohol swabs) needed for patient to self-administer B-12 injections monthly.   oxyCODONE-acetaminophen (PERCOCET/ROXICET) 5-325 MG tablet Take 1 tablet by mouth every 6 (six) hours as needed for moderate pain.   pantoprazole (PROTONIX) 40 MG tablet TAKE ONE (1) TABLET 30 MINS BEFORE YOUR FIRST MEAL.   sacubitril-valsartan (ENTRESTO) 49-51 MG Take 1 tablet by mouth 2 (two) times daily.   Tuberculin-Allergy Syringes (ULTICARE TUBERCULIN SAFETY SYR) 25G X 1" 1 ML MISC USE TO ADMINISTER INJECTABLE VITAMIN B12.   [DISCONTINUED] iron polysaccharides (NIFEREX) 150 MG capsule Take 1 capsule (150 mg total) by mouth daily.   [DISCONTINUED] traMADol (ULTRAM) 50 MG tablet Take 1 tablet (50 mg total) by mouth every 6 (six) hours as needed.   No facility-administered encounter medications on file as of 01/23/2023.    Allergies (verified) Bee venom, Codeine, Penicillins, Bupropion, and Sudafed [pseudoephedrine hcl]   History: Past Medical History:  Diagnosis Date   Anemia 04/30/2016   Anxiety    Arthritis    Cardiomyopathy    a. EF 40-45% by echo in 04/2016 b. Improved to 60-65% by repeat imaging in 2018   COPD (chronic obstructive pulmonary disease)    Coronary artery calcification seen on CT scan    Critical limb ischemia of both lower extremities    Essential hypertension     GERD (gastroesophageal reflux disease)    Headache    History of bronchitis    History of GI bleed    History of hiatal hernia    Hyperlipidemia    Iron deficiency anemia    Peripheral vascular disease    Pollen allergy    PSVT (paroxysmal supraventricular tachycardia)    PVC's (premature ventricular contractions)    Type 2 diabetes mellitus    Vitamin B12 deficiency    Past Surgical History:  Procedure Laterality Date   ABDOMINAL AORTOGRAM W/LOWER EXTREMITY Bilateral 05/19/2021   Procedure: ABDOMINAL AORTOGRAM W/LOWER EXTREMITY;  Surgeon: [name], MD;  Location: [facility];  Service: Cardiovascular;  Laterality: Bilateral;   ABDOMINAL AORTOGRAM W/LOWER EXTREMITY N/A 12/15/2022   Procedure: ABDOMINAL AORTOGRAM W/LOWER EXTREMITY;  Surgeon: [name], MD;  Location: [facility];  Service: Cardiovascular;  Laterality: N/A;   ABDOMINAL AORTOGRAM W/LOWER EXTREMITY N/A 12/18/2022   Procedure: ABDOMINAL AORTOGRAM W/LOWER EXTREMITY;  Surgeon: Chuck Hint, MD;  Location: [facility] INVASIVE CV LAB;  Service: Cardiovascular;  Laterality: N/A;   ABDOMINAL HYSTERECTOMY     polyps  about age 89   AIKEN OSTEOTOMY Right 07/10/2013   Procedure: Quintella Reichert OSTEOTOMY RIGHT FOOT;  Surgeon: Dallas Schimke, DPM;  Location: [facility];  Service: Orthopedics;  Laterality: Right;   AMPUTATION Left 05/09/2017   Procedure: AMPUTATION 5TH TOE LEFT FOOT;  Surgeon: Ferman Hamming, DPM;  Location: [facility];  Service: Podiatry;  Laterality: Left;   AMPUTATION Left 06/13/2017   Procedure: AMPUTATION 2ND TOE LEFT FOOT;  Surgeon: Ferman Hamming, DPM;  Location: [facility];  Service: Podiatry;  Laterality: Left;   AMPUTATION Right 06/01/2021   Procedure: Right great toe and Second Toe Amputation;  Surgeon: [name], MD;  Location: [facility] OR;  Service: Vascular;  Laterality: Right;   BIOPSY  10/27/2022   Procedure: BIOPSY;  Surgeon: Corbin Ade, MD;  [facility]: [facility];  Service:  Endoscopy;;   BUNIONECTOMY Right 07/10/2013   Procedure: Ernesta Amble RIGHT FOOT;  Surgeon: Dallas Schimke, DPM;  Location: [facility];  Service: Orthopedics;  Laterality: Right;   CHOLECYSTECTOMY N/A 08/22/2017   Procedure: LAPAROSCOPIC CHOLECYSTECTOMY;  Surgeon: Lucretia Roers, MD;  Location: [facility];  Service: General;  Laterality: N/A;   COLONOSCOPY N/A 07/07/2016   Dr. Darrick Penna; redundant left colon, diverticulosis at hepatic flexure, non-bleeding internal hemorrhoids   ENDARTERECTOMY FEMORAL Left 01/11/2023   Procedure: LEFT COMMON FEMORAL ENDARTERECTOMY WITH VEIN PATCH ANGIOPLASTY;  Surgeon: [name], MD;  Location: [facility] OR;  Service: Vascular;  Laterality: Left;   ENTEROSCOPY N/A 10/27/2022   Procedure: ENTEROSCOPY;  Surgeon: Corbin Ade, MD;  [facility];  Service: Endoscopy;  Laterality: N/A;   ENTEROSCOPY N/A 12/09/2022   Procedure: ENTEROSCOPY;  Surgeon: Benancio Deeds, MD;  Location: [facility] ENDOSCOPY;  Service: Gastroenterology;  Laterality: N/A;   ESOPHAGOGASTRODUODENOSCOPY N/A 07/07/2016   Dr. Darrick Penna: many non-bleeding cratered gastric ulcers without stigmata of bleeding in gastric antrum. four 2-3 mm angioectasias without bleeding in duodenal bulb and second portion of duodenum s/p APC. Chroni gastritis on path.    ESOPHAGOGASTRODUODENOSCOPY N/A 08/03/2017   Dr. Darrick Penna: erosive gastritis, AVMs. Found a single non-bleeding angioectasia in stomach, s/p APC therapy. Four non-bleeding angioectasias in duodenum s/p APC. Non-bleeding erosive gastropathy   HOT HEMOSTASIS  10/27/2022   Procedure: HOT HEMOSTASIS (ARGON PLASMA COAGULATION/BICAP);  Surgeon: Corbin Ade, MD;  [facility];  Service: Endoscopy;;   HOT HEMOSTASIS N/A 12/09/2022   Procedure: HOT HEMOSTASIS (ARGON PLASMA COAGULATION/BICAP);  Surgeon: Benancio Deeds, MD;  Location: [facility] ENDOSCOPY;  Service: Gastroenterology;  Laterality: N/A;   IR ANGIOGRAM EXTREMITY LEFT  04/24/2017    IR FEM POP ART ATHERECT INC PTA MOD SED  04/24/2017   IR INFUSION THROMBOL ARTERIAL INITIAL (MS)  04/24/2017   IR RADIOLOGIST EVAL & MGMT  12/05/2016   IR US GUIDE VASC ACCESS RIGHT  04/24/2017   LIVER BIOPSY N/A 08/22/2017   Procedure: LIVER BIOPSY;  Surgeon: Lucretia Roers, MD;  Location: [facility];  Service: General;  Laterality: N/A;   METATARSAL HEAD EXCISION Right 07/10/2013   Procedure: METATARSAL HEAD RESECTION OF DIGITS 2 AND 3 RIGHT FOOT;  Surgeon: Dallas Schimke, DPM;  Location: [facility];  Service: Orthopedics;  Laterality: Right;  PERIPHERAL VASCULAR INTERVENTION Right 05/19/2021   Procedure: PERIPHERAL VASCULAR INTERVENTION;  Surgeon: [name], MD;  [facility];  Service: Cardiovascular;  Laterality: Right;   PERIPHERAL VASCULAR INTERVENTION Left 12/15/2022   Procedure: PERIPHERAL VASCULAR INTERVENTION;  Surgeon: [name], MD;  Location: [facility];  Service: Cardiovascular;  Laterality: Left;  SFA   PERIPHERAL VASCULAR INTERVENTION Right 12/18/2022   Procedure: PERIPHERAL VASCULAR INTERVENTION;  Surgeon: Chuck Hint, MD;  [facility]: [facility] INVASIVE CV LAB;  Service: Cardiovascular;  Laterality: Right;   PROXIMAL INTERPHALANGEAL FUSION (PIP) Right 07/10/2013   Procedure: ARTHRODESIS PIPJ  2ND DIGIT RIGHT FOOT;  Surgeon: Dallas Schimke, DPM;  Location: [facility];  Service: Orthopedics;  Laterality: Right;   SUBMUCOSAL TATTOO INJECTION  12/09/2022   Procedure: SUBMUCOSAL TATTOO INJECTION;  Surgeon: Benancio Deeds, MD;  Location: [facility] ENDOSCOPY;  Service: Gastroenterology;;   TRANSMETATARSAL AMPUTATION Left 01/11/2023   Procedure: TRANSMETATARSAL AMPUTATION LEFT;  Surgeon: [name], MD;  Location: [facility] OR;  Service: Vascular;  Laterality: Left;   VEIN HARVEST  01/11/2023   Procedure: VEIN HARVEST LEFT GREATER SAPHENOUS;  Surgeon: [name], MD;  Location: [facility] OR;  Service: Vascular;;   Family History  Adopted: Yes   Problem Relation Age of Onset   Hypertension Father    Coronary artery disease Sister    Ulcers Maternal Grandmother    Colon cancer Neg Hx        unknown, was adopted   Social History   Socioeconomic History   Marital status: Married    Spouse name: [name]   Number of children: 1   Years of education: 12   Highest education level: Not on file  Occupational History   Occupation: disabled    Comment: heart  Tobacco Use   Smoking status: Light Smoker    Packs/day: 0.50    Years: 44.00    Additional pack years: 0.00    Total pack years: 22.00    Types: Cigarettes    Start date: 05/10/1975    Passive exposure: Never   Smokeless tobacco: Never   Tobacco comments:    2 cigarettes per day 12/10/2019  Vaping Use   Vaping Use: Former  Substance and Sexual Activity   Alcohol use: Not Currently    Alcohol/week: 2.0 standard drinks of alcohol    Types: 2 Glasses of wine per week   Drug use: No   Sexual activity: Yes    Birth control/protection: Surgical  Other Topics Concern   Not on file  Social History Narrative   Disabled   Lives with husband [name]   Two dogs   Social Determinants of Health   Financial Resource Strain: Low Risk  (01/22/2023)   Overall Financial Resource Strain (CARDIA)    Difficulty of Paying Living Expenses: Not hard at all  Food Insecurity: No Food Insecurity (01/22/2023)   Hunger Vital Sign    Worried About Running Out of Food in the Last Year: Never true    Ran Out of Food in the Last Year: Never true  Transportation Needs: No Transportation Needs (01/22/2023)   PRAPARE - Administrator, Civil Service (Medical): No    Lack of Transportation (Non-Medical): No  Physical Activity: Patient Declined (01/22/2023)   Exercise Vital Sign    Days of Exercise per Week: Patient declined    Minutes of Exercise per Session: Patient declined  Stress: No Stress Concern Present (01/22/2023)   [employer] of Occupational  Health - Occupational Stress  Questionnaire    Feeling of Stress : Not at all  Social Connections: Moderately Isolated (01/22/2023)   Social Connection and Isolation Panel [NHANES]    Frequency of Communication with Friends and Family: More than three times a week    Frequency of Social Gatherings with Friends and Family: Once a week    Attends Religious Services: Never    Database administrator or Organizations: No    Attends Engineer, structural: Never    Marital Status: Married    Tobacco Counseling Ready to quit: Not Answered Counseling given: Not Answered Tobacco comments: 2 cigarettes per day 12/10/2019   Clinical Intake:  Pre-visit preparation completed: Yes  Pain : No/denies pain     BMI - recorded: 20.8 Nutritional Status: BMI of 19-24  Normal Nutritional Risks: None Diabetes: Yes CBG done?: Yes (122 per pt) CBG resulted in Enter/ Edit results?: No Did pt. bring in CBG monitor from home?: No  How often do you need to have someone help you when you read instructions, pamphlets, or other written materials from your doctor or pharmacy?: 2 - Rarely  Diabetic?Nutrition Risk Assessment:  Has the patient had any N/V/D within the last 2 months?  No  Does the patient have any non-healing wounds?  No  Has the patient had any unintentional weight loss or weight gain?  No   Diabetes:  Is the patient diabetic?  Yes  If diabetic, was a CBG obtained today?  Yes  Did the patient bring in their glucometer from home?  No  How often do you monitor your CBG's? daily.   Financial Strains and Diabetes Management:  Are you having any financial strains with the device, your supplies or your medication? No .  Does the patient want to be seen by Chronic Care Management for management of their diabetes?  No  Would the patient like to be referred to a Nutritionist or for Diabetic Management?  No   Diabetic Exams:  Diabetic Eye Exam: Overdue for diabetic eye exam. Pt has been advised about the  importance in completing this exam. Patient advised to call and schedule an eye exam. Diabetic Foot Exam: Overdue, Pt has been advised about the importance in completing this exam. Pt is scheduled for diabetic foot exam on next appt .  Interpreter Needed?: No  Information entered by :: Lanier Ensign, LPN   Activities of Daily Living    01/22/2023    1:52 PM 01/11/2023    9:40 AM  In your present state of health, do you have any difficulty performing the following activities:  Hearing? 0   Vision? 0   Difficulty concentrating or making decisions? 0   Walking or climbing stairs? 0   Dressing or bathing? 0   Doing errands, shopping? 0 0  Preparing Food and eating ? N   Using the Toilet? N   In the past six months, have you accidently leaked urine? N   Do you have problems with loss of bowel control? N   Managing your Medications? N   Managing your Finances? N   Housekeeping or managing your Housekeeping? N     Patient Care Team: Jarold Motto, [place] as PCP - General (Physician Assistant) Jonelle Sidle, MD as PCP - Cardiology (Cardiology) West Bali, MD (Inactive) as Consulting Physician (Gastroenterology) Jonelle Sidle, MD as Consulting Physician (Cardiology) Coral Ceo, NP as Nurse Practitioner (Pulmonary Disease) Doreatha Massed, MD as Consulting Physician (Hematology)  Roma Kayser, MD as Consulting Physician (Endocrinology)  Indicate any recent Medical Services you may have received from other than [facility] providers in the past year (date may be approximate).     Assessment:   This is a routine wellness examination for [place].  Hearing/Vision screen Hearing Screening - Comments:: Pt denies any hearing issues  Vision Screening - Comments:: My eye dr in [place] for annual eye exams   Dietary issues and exercise activities discussed: Current Exercise Habits: The patient does not participate in regular exercise at present (will start PT May 2nd  2024)   Goals Addressed             This Visit's Progress    Patient Stated       Getting back to walking without walker        Depression Screen    01/23/2023    4:08 PM 12/13/2022    1:59 PM 10/25/2022    1:38 PM 04/26/2022    2:06 PM 02/09/2022   11:47 AM 08/12/2021    2:06 PM 04/26/2021   10:34 AM  PHQ 2/9 Scores  PHQ - 2 Score 0 2 0 1 0 1 0  PHQ- 9 Score 0 9 2 4 2 5      Fall Risk    01/22/2023    1:52 PM 12/13/2022    2:00 PM 04/26/2022    2:06 PM 02/09/2022   11:47 AM 08/12/2021    2:06 PM  Fall Risk   Falls in the past year? 0 1 1 1  0  Number falls in past yr: 0 1 0 0 0  Injury with Fall? 0 0 1 1 0  Risk for fall due to : Impaired vision;Impaired mobility;Impaired balance/gait History of fall(s);Impaired balance/gait;Impaired mobility History of fall(s) History of fall(s) No Fall Risks  Risk for fall due to: Comment related to foot surgery      Follow up Falls prevention discussed Falls evaluation completed Falls evaluation completed Falls evaluation completed Falls evaluation completed    FALL RISK PREVENTION PERTAINING TO THE HOME:  Any stairs in or around the home? No  If so, are there any without handrails? No  Home free of loose throw rugs in walkways, pet beds, electrical cords, etc? Yes  Adequate lighting in your home to reduce risk of falls? Yes   ASSISTIVE DEVICES UTILIZED TO PREVENT FALLS:  Life alert? No  Use of a cane, walker or w/c? Yes  Grab bars in the bathroom? No  Shower chair or bench in shower? Yes  Elevated toilet seat or a handicapped toilet? Yes   TIMED UP AND GO:  Was the test performed? No .    Cognitive Function:Declined         10/27/2019    1:04 PM  6CIT Screen  What Year? 0 points  What month? 0 points  What time? 0 points  Count back from 20 0 points  Months in reverse 0 points  Repeat phrase 0 points  Total Score 0 points    Immunizations Immunization History  Administered Date(s) Administered    Influenza,inj,Quad PF,6+ Mos 07/04/2017, 07/10/2018, 07/02/2019, 07/07/2020, 07/13/2021   Influenza-Unspecified 07/14/2022   Moderna SARS-COV2 Booster Vaccination 02/15/2021   Moderna Sars-Covid-2 Vaccination 12/17/2019, 01/12/2020, 08/04/2020   PNEUMOCOCCAL CONJUGATE-20 10/25/2022   Pneumococcal Polysaccharide-23 11/01/2018    TDAP status: Due, Education has been provided regarding the importance of this vaccine. Advised may receive this vaccine at local pharmacy or Health Dept. Aware to provide a copy  of the vaccination record if obtained from local pharmacy or Health Dept. Verbalized acceptance and understanding.  Flu Vaccine status: Up to date  Pneumococcal vaccine status: Up to date  Covid-19 vaccine status: Completed vaccines  Qualifies for Shingles Vaccine? Yes   Zostavax completed No   Shingrix Completed?: No.    Education has been provided regarding the importance of this vaccine. Patient has been advised to call insurance company to determine out of pocket expense if they have not yet received this vaccine. Advised may also receive vaccine at local pharmacy or Health Dept. Verbalized acceptance and understanding.  Screening Tests Health Maintenance  Topic Date Due   FOOT EXAM  Never done   OPHTHALMOLOGY EXAM  Never done   DTaP/Tdap/Td (1 - Tdap) Never done   Lung Cancer Screening  02/11/2023   COVID-19 Vaccine (4 - 2023-24 season) 10/26/2023 (Originally 06/02/2022)   Zoster Vaccines- Shingrix (1 of 2) 10/26/2023 (Originally 08/31/1975)   MAMMOGRAM  01/23/2024 (Originally 05/19/2022)   DEXA SCAN  01/23/2024 (Originally 08/31/2022)   HEMOGLOBIN A1C  04/25/2023   Diabetic kidney evaluation - Urine ACR  04/27/2023   INFLUENZA VACCINE  05/03/2023   Diabetic kidney evaluation - eGFR measurement  01/12/2024   Medicare Annual Wellness (AWV)  01/23/2024   COLONOSCOPY (Pts 45-9yrs Insurance coverage will need to be confirmed)  07/07/2026   Pneumonia Vaccine 9+ Years old  Completed    Hepatitis C Screening  Completed   HIV Screening  Completed   HPV VACCINES  Aged Out    Health Maintenance  Health Maintenance Due  Topic Date Due   FOOT EXAM  Never done   OPHTHALMOLOGY EXAM  Never done   DTaP/Tdap/Td (1 - Tdap) Never done   Lung Cancer Screening  02/11/2023    Colorectal cancer screening: Type of screening: Colonoscopy. Completed 07/07/16. Repeat every 10 years  Mammogram status: Completed 05/19/20. Repeat every year     Additional Screening:  Hepatitis C Screening:  Completed 01/21/19  Vision Screening: Recommended annual ophthalmology exams for early detection of glaucoma and other disorders of the eye. Is the patient up to date with their annual eye exam?  No  Who is the provider or what is the name of the office in which the patient attends annual eye exams? [facility]  If pt is not established with a provider, would they like to be referred to a provider to establish care? No .   Dental Screening: Recommended annual dental exams for proper oral hygiene  Community Resource Referral / Chronic Care Management: CRR required this visit?  No   CCM required this visit?  No      Plan:     I have personally reviewed and noted the following in the patient's chart:   Medical and social history Use of alcohol, tobacco or illicit drugs  Current medications and supplements including opioid prescriptions. Patient is currently taking opioid prescriptions. Information provided to patient regarding non-opioid alternatives. Patient advised to discuss non-opioid treatment plan with their provider. Functional ability and status Nutritional status Physical activity Advanced directives List of other physicians Hospitalizations, surgeries, and ER visits in previous 12 months Vitals Screenings to include cognitive, depression, and falls Referrals and appointments  In addition, I have reviewed and discussed with patient certain preventive protocols, quality  metrics, and best practice recommendations. A written personalized care plan for preventive services as well as general preventive health recommendations were provided to patient.     Marzella Schlein, LPN  01/23/2023   Nurse Notes: pt declined cognition testing at this time pt was in pain and has just had a procedure.

## 2023-01-23 NOTE — Patient Instructions (Signed)
Tammy Mcpherson , Thank you for taking time to come for your Medicare Wellness Visit. I appreciate your ongoing commitment to your health goals. Please review the following plan we discussed and let me know if I can assist you in the future.   These are the goals we discussed:  Goals      Quit smoking / using tobacco     Stop smoking     Weight (lb) < 200 lb (90.7 kg)     Eat healthier         This is a list of the screening recommended for you and due dates:  Health Maintenance  Topic Date Due   Complete foot exam   Never done   Eye exam for diabetics  Never done   DTaP/Tdap/Td vaccine (1 - Tdap) Never done   Mammogram  05/19/2022   DEXA scan (bone density measurement)  Never done   Screening for Lung Cancer  02/11/2023   COVID-19 Vaccine (1) 10/26/2023*   Zoster (Shingles) Vaccine (1 of 2) 10/26/2023*   Hemoglobin A1C  04/25/2023   Yearly kidney health urinalysis for diabetes  04/27/2023   Flu Shot  05/03/2023   Yearly kidney function blood test for diabetes  01/12/2024   Medicare Annual Wellness Visit  01/23/2024   Colon Cancer Screening  07/07/2026   Pneumonia Vaccine  Completed   Hepatitis C Screening: USPSTF Recommendation to screen - Ages 18-79 yo.  Completed   HIV Screening  Completed   HPV Vaccine  Aged Out  *Topic was postponed. The date shown is not the original due date.    Advanced directives: Please bring a copy of your health care power of attorney and living will to the office at your convenience.  Conditions/risks identified: get back to walking without a walker   Next appointment: Follow up in one year for your annual wellness visit    Preventive Care 65 Years and Older, Female Preventive care refers to lifestyle choices and visits with your health care provider that can promote health and wellness. What does preventive care include? A yearly physical exam. This is also called an annual well check. Dental exams once or twice a year. Routine eye exams.  Ask your health care provider how often you should have your eyes checked. Personal lifestyle choices, including: Daily care of your teeth and gums. Regular physical activity. Eating a healthy diet. Avoiding tobacco and drug use. Limiting alcohol use. Practicing safe sex. Taking low-dose aspirin every day. Taking vitamin and mineral supplements as recommended by your health care provider. What happens during an annual well check? The services and screenings done by your health care provider during your annual well check will depend on your age, overall health, lifestyle risk factors, and family history of disease. Counseling  Your health care provider may ask you questions about your: Alcohol use. Tobacco use. Drug use. Emotional well-being. Home and relationship well-being. Sexual activity. Eating habits. History of falls. Memory and ability to understand (cognition). Work and work Astronomer. Reproductive health. Screening  You may have the following tests or measurements: Height, weight, and BMI. Blood pressure. Lipid and cholesterol levels. These may be checked every 5 years, or more frequently if you are over 24 years old. Skin check. Lung cancer screening. You may have this screening every year starting at age 8 if you have a 30-pack-year history of smoking and currently smoke or have quit within the past 15 years. Fecal occult blood test (FOBT) of the stool. You  may have this test every year starting at age 70. Flexible sigmoidoscopy or colonoscopy. You may have a sigmoidoscopy every 5 years or a colonoscopy every 10 years starting at age 21. Hepatitis C blood test. Hepatitis B blood test. Sexually transmitted disease (STD) testing. Diabetes screening. This is done by checking your blood sugar (glucose) after you have not eaten for a while (fasting). You may have this done every 1-3 years. Bone density scan. This is done to screen for osteoporosis. You may have this  done starting at age 84. Mammogram. This may be done every 1-2 years. Talk to your health care provider about how often you should have regular mammograms. Talk with your health care provider about your test results, treatment options, and if necessary, the need for more tests. Vaccines  Your health care provider may recommend certain vaccines, such as: Influenza vaccine. This is recommended every year. Tetanus, diphtheria, and acellular pertussis (Tdap, Td) vaccine. You may need a Td booster every 10 years. Zoster vaccine. You may need this after age 38. Pneumococcal 13-valent conjugate (PCV13) vaccine. One dose is recommended after age 24. Pneumococcal polysaccharide (PPSV23) vaccine. One dose is recommended after age 50. Talk to your health care provider about which screenings and vaccines you need and how often you need them. This information is not intended to replace advice given to you by your health care provider. Make sure you discuss any questions you have with your health care provider. Document Released: 10/15/2015 Document Revised: 06/07/2016 Document Reviewed: 07/20/2015 Elsevier Interactive Patient Education  2017 [employer].  Fall Prevention in the Home Falls can cause injuries. They can happen to people of all ages. There are many things you can do to make your home safe and to help prevent falls. What can I do on the outside of my home? Regularly fix the edges of walkways and driveways and fix any cracks. Remove anything that might make you trip as you walk through a door, such as a raised step or threshold. Trim any bushes or trees on the path to your home. Use bright outdoor lighting. Clear any walking paths of anything that might make someone trip, such as rocks or tools. Regularly check to see if handrails are loose or broken. Make sure that both sides of any steps have handrails. Any raised decks and porches should have guardrails on the edges. Have any leaves,  snow, or ice cleared regularly. Use sand or salt on walking paths during winter. Clean up any spills in your garage right away. This includes oil or grease spills. What can I do in the bathroom? Use night lights. Install grab bars by the toilet and in the tub and shower. Do not use towel bars as grab bars. Use non-skid mats or decals in the tub or shower. If you need to sit down in the shower, use a plastic, non-slip stool. Keep the floor dry. Clean up any water that spills on the floor as soon as it happens. Remove soap buildup in the tub or shower regularly. Attach bath mats securely with double-sided non-slip rug tape. Do not have throw rugs and other things on the floor that can make you trip. What can I do in the bedroom? Use night lights. Make sure that you have a light by your bed that is easy to reach. Do not use any sheets or blankets that are too big for your bed. They should not hang down onto the floor. Have a firm chair that has side  arms. You can use this for support while you get dressed. Do not have throw rugs and other things on the floor that can make you trip. What can I do in the kitchen? Clean up any spills right away. Avoid walking on wet floors. Keep items that you use a lot in easy-to-reach places. If you need to reach something above you, use a strong step stool that has a grab bar. Keep electrical cords out of the way. Do not use floor polish or wax that makes floors slippery. If you must use wax, use non-skid floor wax. Do not have throw rugs and other things on the floor that can make you trip. What can I do with my stairs? Do not leave any items on the stairs. Make sure that there are handrails on both sides of the stairs and use them. Fix handrails that are broken or loose. Make sure that handrails are as long as the stairways. Check any carpeting to make sure that it is firmly attached to the stairs. Fix any carpet that is loose or worn. Avoid having throw  rugs at the top or bottom of the stairs. If you do have throw rugs, attach them to the floor with carpet tape. Make sure that you have a light switch at the top of the stairs and the bottom of the stairs. If you do not have them, ask someone to add them for you. What else can I do to help prevent falls? Wear shoes that: Do not have high heels. Have rubber bottoms. Are comfortable and fit you well. Are closed at the toe. Do not wear sandals. If you use a stepladder: Make sure that it is fully opened. Do not climb a closed stepladder. Make sure that both sides of the stepladder are locked into place. Ask someone to hold it for you, if possible. Clearly mark and make sure that you can see: Any grab bars or handrails. First and last steps. Where the edge of each step is. Use tools that help you move around (mobility aids) if they are needed. These include: Canes. Walkers. Scooters. Crutches. Turn on the lights when you go into a dark area. Replace any light bulbs as soon as they burn out. Set up your furniture so you have a clear path. Avoid moving your furniture around. If any of your floors are uneven, fix them. If there are any pets around you, be aware of where they are. Review your medicines with your doctor. Some medicines can make you feel dizzy. This can increase your chance of falling. Ask your doctor what other things that you can do to help prevent falls. This information is not intended to replace advice given to you by your health care provider. Make sure you discuss any questions you have with your health care provider. Document Released: 07/15/2009 Document Revised: 02/24/2016 Document Reviewed: 10/23/2014 Elsevier Interactive Patient Education  2017 [employer].

## 2023-01-30 ENCOUNTER — Other Ambulatory Visit: Payer: Self-pay | Admitting: Physician Assistant

## 2023-01-30 DIAGNOSIS — M549 Dorsalgia, unspecified: Secondary | ICD-10-CM

## 2023-02-01 ENCOUNTER — Ambulatory Visit (HOSPITAL_COMMUNITY): Payer: PPO | Admitting: Physical Therapy

## 2023-02-06 ENCOUNTER — Other Ambulatory Visit: Payer: Self-pay

## 2023-02-06 ENCOUNTER — Encounter (HOSPITAL_COMMUNITY): Payer: Self-pay | Admitting: Vascular Surgery

## 2023-02-06 ENCOUNTER — Ambulatory Visit (INDEPENDENT_AMBULATORY_CARE_PROVIDER_SITE_OTHER): Payer: PPO | Admitting: Physician Assistant

## 2023-02-06 VITALS — BP 110/73 | HR 111 | Temp 97.8°F

## 2023-02-06 DIAGNOSIS — T8189XA Other complications of procedures, not elsewhere classified, initial encounter: Secondary | ICD-10-CM

## 2023-02-06 DIAGNOSIS — I739 Peripheral vascular disease, unspecified: Secondary | ICD-10-CM

## 2023-02-06 NOTE — Progress Notes (Signed)
Spoke with pt's husband, Tammy Mcpherson for pre-op call. Pt was sleeping. She was here for surgery and admission in April. Tammy Mcpherson states nothing has changed with her medical history. Pt is to continue both Aspirin and Plavix prior to surgery. Tammy Mcpherson states that pt was told this today by Dr. Ophelia Charter PA.  Pt is diabetic. Last A1C was 7.3 on 10/25/22. He states that her fasting blood sugar is usually between 130-140. Instructed him to have pt not to take her Metformin in the AM. Instructed him to have pt check her blood sugar in the AM and every 2 hours until they leave for the hospital.  If blood sugar is 70 or below, treat with 1/2 cup of clear juice (apple or cranberry) and recheck blood sugar 15 minutes after drinking juice. If blood sugar continues to be 70 or below, call the Short Stay department and ask to speak to a nurse.  Shower instructions given to [employer].

## 2023-02-06 NOTE — Progress Notes (Signed)
POST OPERATIVE OFFICE NOTE    CC:  F/u for surgery  HPI: Tammy Mcpherson is a 67 y.o. female who is s/p left greater saphenous vein harvest, left common femoral endarterectomy with saphenous vein patch angioplasty, and left transmetatarsal amputation on 01/11/2023 by Dr. Lenell Antu.  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  Prior to the surgery she underwent left femoropopliteal angioplasty and stenting on 12/15/2022 by Dr. Lenell Antu.  She also underwent right SFA angioplasty and stenting with DCBA of previous right femoral stent on 12/18/2022 by Dr. Edilia Bo.  She has a history of right popliteal stenting and right anterior tibial angioplasty on 05/19/2021 by Dr. Lenell Antu.  She then had a right great toe and second toe amputation on 06/01/2021 by Dr. Lenell Antu.  Pt returns today for follow up.  She has felt okay since surgery.  She is trying to keep her left TMA clean daily.  She denies any drainage from this incision site or bleeding.  She denies any pain in either of her feet.  She is walking a little bit with a walker.  She states about 3 days ago her left groin incision opened up.  Before the incision opened up, it had a "knot" under the skin.  Since incision has opened up and has had mild serous drainage.  She denies any bleeding, redness, or pus.  It is tender to touch. She denies any fevers   Allergies  Allergen Reactions   Bee Venom Anaphylaxis and Swelling   Codeine Anaphylaxis and Other (See Comments)    Tongue swelled   Penicillins Anaphylaxis, Swelling and Rash    Has patient had a PCN reaction causing immediate rash, facial/tongue/throat swelling, SOB or lightheadedness with hypotension: No, delayed Has patient had a PCN reaction causing severe rash involving mucus membranes or skin necrosis: No Has patient had a PCN reaction that required hospitalization No Has patient had a PCN reaction occurring within the last 10 years: No If  all of the above answers are "NO", then may proceed with Cephalosporin use.    Bupropion Other (See Comments)    "Made my skin crawl"   Sudafed [Pseudoephedrine Hcl] Rash    Current Outpatient Medications  Medication Sig Dispense Refill   Acetaminophen 500 MG capsule Take 500-1,000 mg by mouth every 6 (six) hours as needed for pain (or headaches).     albuterol (PROVENTIL) (2.5 MG/3ML) 0.083% nebulizer solution Take 3 mLs (2.5 mg total) by nebulization every 4 (four) hours as needed for wheezing or shortness of breath. 75 mL 5   albuterol (VENTOLIN HFA) 108 (90 Base) MCG/ACT inhaler INHALE TWO PUFFS INTO THE LUNGS EVERY SIX HOURS AS NEEDED FOR WHEEZING OR SHORTNESS OF BREATH 8.5 g 5   ALPRAZolam (XANAX) 0.5 MG tablet TAKE ONE TABLET (0.5MG  TOTAL) BY MOUTH DAILY AS NEEDED FOR ANXIETY 30 tablet 2   aspirin 81 MG chewable tablet Chew 1 tablet (81 mg total) by mouth every morning. 30 tablet 0   atorvastatin (LIPITOR) 80 MG tablet Take 1 tablet (80 mg total) by mouth daily. (Patient taking differently: Take 80 mg by mouth at bedtime.) 90 tablet 3   blood glucose meter kit and supplies Dispense based on patient and insurance preference. Use up to four times daily as directed. (FOR ICD-10 E10.9, E11.9). 1 each 0   Cholecalciferol (VITAMIN D) 50 MCG (2000 UT) CAPS Take 1 capsule (2,000 Units total) by mouth daily. 90 capsule 4   clopidogrel (PLAVIX)  75 MG tablet Take 1 tablet (75 mg total) by mouth daily. TAKE ONE TABLET (75MG  TOTAL) BY MOUTH DAILY WITH BREAKFAST 90 tablet 1   Continuous Blood Gluc Receiver (FREESTYLE LIBRE 2 READER) DEVI Check blood glucose up to 4 times daily 1 each 0   Continuous Blood Gluc Sensor (FREESTYLE LIBRE 2 SENSOR) MISC Apply one sensor to the upper arm avery 14 days. (Patient taking differently: Inject 1 patch into the skin See admin instructions. Apply one sensor into the upper arm every 14 days) 6 each 1   cyanocobalamin (VITAMIN B12) 1000 MCG/ML injection INJECT 1 ML INTO  THE MUSCLE EVERY 30 DAYS. (Patient taking differently: Inject 1,000 mcg into the skin every 30 (thirty) days.) 1 mL 11   cyclobenzaprine (FLEXERIL) 5 MG tablet TAKE ONE TABLET (5MG  TOTAL) BY MOUTH THREE TIMES DAILY AS NEEDED FOR MUSCLE SPASMS 45 tablet 1   dicyclomine (BENTYL) 10 MG capsule Take 1 capsule (10 mg total) by mouth 2 (two) times daily as needed for spasms. 45 capsule 1   FLUoxetine (PROZAC) 40 MG capsule Take 1 capsule (40 mg total) by mouth daily. (Patient taking differently: Take 40 mg by mouth every evening.) 90 capsule 3   gabapentin (NEURONTIN) 300 MG capsule Take 1 capsule (300 mg total) by mouth 3 (three) times daily. 270 capsule 0   glucose blood (ONETOUCH ULTRA) test strip Use as instructed 100 each 12   glucose monitoring kit (FREESTYLE) monitoring kit Use to check blood sugar BID as directed 1 each 0   Lancets (ONETOUCH DELICA PLUS LANCET33G) MISC 1 each by Does not apply route 2 (two) times a day. 100 each 3   metFORMIN (GLUCOPHAGE-XR) 500 MG 24 hr tablet Take 1 tablet (500 mg total) by mouth daily with breakfast. (Patient taking differently: Take 500 mg by mouth daily as needed (high blood sugar).) 90 tablet 1   Misc. Devices MISC Please provide supplies (needle, syringe, alcohol swabs) needed for patient to self-administer B-12 injections monthly. 1 each 12   oxyCODONE-acetaminophen (PERCOCET/ROXICET) 5-325 MG tablet Take 1 tablet by mouth every 6 (six) hours as needed for moderate pain. 30 tablet 0   pantoprazole (PROTONIX) 40 MG tablet TAKE ONE (1) TABLET 30 MINS BEFORE YOUR FIRST MEAL. 90 tablet 3   sacubitril-valsartan (ENTRESTO) 49-51 MG Take 1 tablet by mouth 2 (two) times daily. 60 tablet 6   Tuberculin-Allergy Syringes (ULTICARE TUBERCULIN SAFETY SYR) 25G X 1" 1 ML MISC USE TO ADMINISTER INJECTABLE VITAMIN B12. 1 each 11   No current facility-administered medications for this visit.     ROS:  See HPI  Physical Exam:  Incision: Dehisced left groin incision with  significant fat necrosis tissue.  No significant erythema, tender to palpation Extremities: Brisk left AT/PT/peroneal Doppler signals.  Left TMA healing appropriately with staples Neuro: Intact motor and sensation of left lower extremity      Assessment/Plan:  This is a 67 y.o. female who is s/p: Left common femoral endarterectomy with greater saphenous vein patch and left TMA on 01/11/2023  -The patient notes that after surgery she had a palpable knot in her left groin.  About 3 days ago she thinks that her left groin incision "opened up".  And has had some serous drainage since then and has been tender to touch.  She denies any fevers or pus -She has brisk left AT/PT Doppler signals -She has been keeping her left TMA clean.  She washes it daily with soap and water and Betadine.  On exam today the TMA is healing appropriately with some superficial scabbing.  No signs of infection or drainage.  The staples will stay in for now -Her left groin incision is dehisced with significant fat necrosis.  Dr. Chestine Spore also evaluated the patient today.  She will require left groin washout and debridement with possible wound VAC placement tomorrow in the OR with Dr. Chestine Spore.   Loel Dubonnet, PA-C [facility] [phone]   Clinic MD:  Chestine Spore

## 2023-02-07 ENCOUNTER — Inpatient Hospital Stay (HOSPITAL_COMMUNITY)
Admission: RE | Admit: 2023-02-07 | Discharge: 2023-02-12 | DRG: 903 | Disposition: A | Discharge status: Home Health | Payer: PPO | Attending: Vascular Surgery | Admitting: Vascular Surgery

## 2023-02-07 ENCOUNTER — Ambulatory Visit (HOSPITAL_COMMUNITY): Payer: PPO | Admitting: Anesthesiology

## 2023-02-07 ENCOUNTER — Encounter (HOSPITAL_COMMUNITY)
Admission: RE | Disposition: A | Discharge status: Home Health | Payer: Self-pay | Source: Home / Self Care | Attending: Vascular Surgery

## 2023-02-07 ENCOUNTER — Other Ambulatory Visit: Payer: Self-pay

## 2023-02-07 ENCOUNTER — Encounter (HOSPITAL_COMMUNITY): Payer: Self-pay | Admitting: Vascular Surgery

## 2023-02-07 DIAGNOSIS — Z7902 Long term (current) use of antithrombotics/antiplatelets: Secondary | ICD-10-CM

## 2023-02-07 DIAGNOSIS — Z7982 Long term (current) use of aspirin: Secondary | ICD-10-CM | POA: Diagnosis not present

## 2023-02-07 DIAGNOSIS — Z79899 Other long term (current) drug therapy: Secondary | ICD-10-CM

## 2023-02-07 DIAGNOSIS — E1151 Type 2 diabetes mellitus with diabetic peripheral angiopathy without gangrene: Secondary | ICD-10-CM

## 2023-02-07 DIAGNOSIS — L7632 Postprocedural hematoma of skin and subcutaneous tissue following other procedure: Secondary | ICD-10-CM | POA: Diagnosis not present

## 2023-02-07 DIAGNOSIS — I70202 Unspecified atherosclerosis of native arteries of extremities, left leg: Secondary | ICD-10-CM | POA: Diagnosis present

## 2023-02-07 DIAGNOSIS — I2510 Atherosclerotic heart disease of native coronary artery without angina pectoris: Secondary | ICD-10-CM | POA: Diagnosis not present

## 2023-02-07 DIAGNOSIS — Z885 Allergy status to narcotic agent status: Secondary | ICD-10-CM | POA: Diagnosis not present

## 2023-02-07 DIAGNOSIS — I110 Hypertensive heart disease with heart failure: Secondary | ICD-10-CM | POA: Diagnosis not present

## 2023-02-07 DIAGNOSIS — I509 Heart failure, unspecified: Secondary | ICD-10-CM

## 2023-02-07 DIAGNOSIS — T8131XA Disruption of external operation (surgical) wound, not elsewhere classified, initial encounter: Principal | ICD-10-CM | POA: Diagnosis present

## 2023-02-07 DIAGNOSIS — E876 Hypokalemia: Secondary | ICD-10-CM | POA: Diagnosis present

## 2023-02-07 DIAGNOSIS — I9789 Other postprocedural complications and disorders of the circulatory system, not elsewhere classified: Secondary | ICD-10-CM | POA: Diagnosis not present

## 2023-02-07 DIAGNOSIS — Y838 Other surgical procedures as the cause of abnormal reaction of the patient, or of later complication, without mention of misadventure at the time of the procedure: Secondary | ICD-10-CM | POA: Diagnosis present

## 2023-02-07 DIAGNOSIS — Z91030 Bee allergy status: Secondary | ICD-10-CM | POA: Diagnosis not present

## 2023-02-07 DIAGNOSIS — T8189XA Other complications of procedures, not elsewhere classified, initial encounter: Secondary | ICD-10-CM | POA: Diagnosis present

## 2023-02-07 DIAGNOSIS — E119 Type 2 diabetes mellitus without complications: Secondary | ICD-10-CM | POA: Diagnosis present

## 2023-02-07 DIAGNOSIS — Z888 Allergy status to other drugs, medicaments and biological substances status: Secondary | ICD-10-CM | POA: Diagnosis not present

## 2023-02-07 DIAGNOSIS — F17210 Nicotine dependence, cigarettes, uncomplicated: Secondary | ICD-10-CM | POA: Diagnosis not present

## 2023-02-07 DIAGNOSIS — Z7984 Long term (current) use of oral hypoglycemic drugs: Secondary | ICD-10-CM

## 2023-02-07 DIAGNOSIS — Z880 Allergy status to penicillin: Secondary | ICD-10-CM | POA: Diagnosis not present

## 2023-02-07 HISTORY — PX: APPLICATION OF WOUND VAC: SHX5189

## 2023-02-07 HISTORY — PX: INCISION AND DRAINAGE OF WOUND: SHX1803

## 2023-02-07 LAB — POCT I-STAT, CHEM 8
BUN: 10 mg/dL (ref 8–23)
Calcium, Ion: 1.04 mmol/L — ABNORMAL LOW (ref 1.15–1.40)
Chloride: 97 mmol/L — ABNORMAL LOW (ref 98–111)
Creatinine, Ser: 0.6 mg/dL (ref 0.44–1.00)
Glucose, Bld: 107 mg/dL — ABNORMAL HIGH (ref 70–99)
HCT: 35 % — ABNORMAL LOW (ref 36.0–46.0)
Hemoglobin: 11.9 g/dL — ABNORMAL LOW (ref 12.0–15.0)
Potassium: 2.9 mmol/L — ABNORMAL LOW (ref 3.5–5.1)
Sodium: 136 mmol/L (ref 135–145)
TCO2: 30 mmol/L (ref 22–32)

## 2023-02-07 LAB — CBC
HCT: 32.5 % — ABNORMAL LOW (ref 36.0–46.0)
Hemoglobin: 10.6 g/dL — ABNORMAL LOW (ref 12.0–15.0)
MCH: 29.3 pg (ref 26.0–34.0)
MCHC: 32.6 g/dL (ref 30.0–36.0)
MCV: 89.8 fL (ref 80.0–100.0)
Platelets: 569 10*3/uL — ABNORMAL HIGH (ref 150–400)
RBC: 3.62 MIL/uL — ABNORMAL LOW (ref 3.87–5.11)
RDW: 17.7 % — ABNORMAL HIGH (ref 11.5–15.5)
WBC: 9.1 10*3/uL (ref 4.0–10.5)
nRBC: 0 % (ref 0.0–0.2)

## 2023-02-07 LAB — GLUCOSE, CAPILLARY
Glucose-Capillary: 127 mg/dL — ABNORMAL HIGH (ref 70–99)
Glucose-Capillary: 118 mg/dL — ABNORMAL HIGH (ref 70–99)
Glucose-Capillary: 213 mg/dL — ABNORMAL HIGH (ref 70–99)
Glucose-Capillary: 147 mg/dL — ABNORMAL HIGH (ref 70–99)

## 2023-02-07 LAB — CREATININE, SERUM
Creatinine, Ser: 0.62 mg/dL (ref 0.44–1.00)
GFR, Estimated: >60 mL/min (ref >60)

## 2023-02-07 LAB — AEROBIC/ANAEROBIC CULTURE W GRAM STAIN (SURGICAL/DEEP WOUND)

## 2023-02-07 SURGERY — IRRIGATION AND DEBRIDEMENT WOUND
Anesthesia: General | Site: Groin | Laterality: Left

## 2023-02-07 MED ORDER — FENTANYL CITRATE (PF) 250 MCG/5ML IJ SOLN
INTRAMUSCULAR | Status: AC
  Filled 2023-02-07: qty 5

## 2023-02-07 MED ORDER — DEXAMETHASONE SODIUM PHOSPHATE 10 MG/ML IJ SOLN
INTRAMUSCULAR | Status: AC
  Filled 2023-02-07: qty 1

## 2023-02-07 MED ORDER — ALPRAZOLAM 0.5 MG PO TABS: 0.5000 mg | ORAL_TABLET | Freq: Every evening | ORAL | Status: DC | PRN

## 2023-02-07 MED ORDER — ONDANSETRON HCL 4 MG/2ML IJ SOLN
INTRAMUSCULAR | Status: AC
  Filled 2023-02-07: qty 2

## 2023-02-07 MED ORDER — LIDOCAINE 2% (20 MG/ML) 5 ML SYRINGE
INTRAMUSCULAR | Status: AC
  Filled 2023-02-07: qty 5

## 2023-02-07 MED ORDER — ORAL CARE MOUTH RINSE: 15.0000 mL | Freq: Once | OROMUCOSAL | Status: AC

## 2023-02-07 MED ORDER — DICYCLOMINE HCL 10 MG PO CAPS
10.0000 mg | ORAL_CAPSULE | Freq: Two times a day (BID) | ORAL | Status: DC | PRN
  Filled 2023-02-07: qty 1

## 2023-02-07 MED ORDER — DEXAMETHASONE SODIUM PHOSPHATE 4 MG/ML IJ SOLN
INTRAMUSCULAR | Status: DC | PRN
  Administered 2023-02-07: 4 mg via INTRAVENOUS

## 2023-02-07 MED ORDER — GABAPENTIN 300 MG PO CAPS
300.0000 mg | ORAL_CAPSULE | Freq: Three times a day (TID) | ORAL | Status: DC
  Administered 2023-02-07 – 2023-02-12 (×15): 300 mg via ORAL
  Filled 2023-02-07 (×15): qty 1

## 2023-02-07 MED ORDER — PHENYLEPHRINE HCL-NACL 20-0.9 MG/250ML-% IV SOLN
INTRAVENOUS | Status: DC | PRN
  Administered 2023-02-07: 100 ug/min via INTRAVENOUS

## 2023-02-07 MED ORDER — MIDAZOLAM HCL 2 MG/2ML IJ SOLN
INTRAMUSCULAR | Status: AC
  Filled 2023-02-07: qty 2

## 2023-02-07 MED ORDER — HYDRALAZINE HCL 20 MG/ML IJ SOLN: 5.0000 mg | INTRAMUSCULAR | Status: DC | PRN

## 2023-02-07 MED ORDER — ACETAMINOPHEN 10 MG/ML IV SOLN: 1000.0000 mg | Freq: Once | INTRAVENOUS | Status: DC | PRN

## 2023-02-07 MED ORDER — ALUM & MAG HYDROXIDE-SIMETH 200-200-20 MG/5ML PO SUSP: 15.0000 mL | ORAL | Status: DC | PRN

## 2023-02-07 MED ORDER — CHLORHEXIDINE GLUCONATE 4 % EX SOLN: 60.0000 mL | Freq: Once | CUTANEOUS | Status: DC

## 2023-02-07 MED ORDER — HEPARIN SODIUM (PORCINE) 5000 UNIT/ML IJ SOLN
5000.0000 [IU] | Freq: Three times a day (TID) | INTRAMUSCULAR | Status: DC
  Administered 2023-02-08 – 2023-02-12 (×13): 5000 [IU] via SUBCUTANEOUS
  Filled 2023-02-07 (×13): qty 1

## 2023-02-07 MED ORDER — ONDANSETRON HCL 4 MG/2ML IJ SOLN: 4.0000 mg | Freq: Once | INTRAMUSCULAR | Status: DC | PRN

## 2023-02-07 MED ORDER — PROPOFOL 10 MG/ML IV BOLUS
INTRAVENOUS | Status: DC | PRN
  Administered 2023-02-07: 100 mg via INTRAVENOUS

## 2023-02-07 MED ORDER — METOPROLOL TARTRATE 5 MG/5ML IV SOLN: 2.0000 mg | INTRAVENOUS | Status: DC | PRN

## 2023-02-07 MED ORDER — ALBUTEROL SULFATE (2.5 MG/3ML) 0.083% IN NEBU: 2.5000 mg | INHALATION_SOLUTION | RESPIRATORY_TRACT | Status: DC | PRN

## 2023-02-07 MED ORDER — CYCLOBENZAPRINE HCL 10 MG PO TABS: 5.0000 mg | ORAL_TABLET | Freq: Three times a day (TID) | ORAL | Status: DC | PRN

## 2023-02-07 MED ORDER — AMISULPRIDE (ANTIEMETIC) 5 MG/2ML IV SOLN: 10.0000 mg | Freq: Once | INTRAVENOUS | Status: DC | PRN

## 2023-02-07 MED ORDER — OXYCODONE-ACETAMINOPHEN 5-325 MG PO TABS
1.0000 | ORAL_TABLET | ORAL | Status: DC | PRN
  Administered 2023-02-07 – 2023-02-10 (×12): 2 via ORAL
  Administered 2023-02-10: 1 via ORAL
  Administered 2023-02-10: 2 via ORAL
  Administered 2023-02-11 (×3): 1 via ORAL
  Filled 2023-02-07 (×2): qty 1
  Filled 2023-02-07 (×7): qty 2
  Filled 2023-02-07: qty 1
  Filled 2023-02-07 (×4): qty 2
  Filled 2023-02-07: qty 1
  Filled 2023-02-07 (×2): qty 2

## 2023-02-07 MED ORDER — POTASSIUM CHLORIDE CRYS ER 20 MEQ PO TBCR
20.0000 meq | EXTENDED_RELEASE_TABLET | Freq: Once | ORAL | Status: AC
  Administered 2023-02-07: 40 meq via ORAL
  Filled 2023-02-07: qty 2

## 2023-02-07 MED ORDER — LIDOCAINE 2% (20 MG/ML) 5 ML SYRINGE
INTRAMUSCULAR | Status: DC | PRN
  Administered 2023-02-07: 60 mg via INTRAVENOUS

## 2023-02-07 MED ORDER — LABETALOL HCL 5 MG/ML IV SOLN: 10.0000 mg | INTRAVENOUS | Status: DC | PRN

## 2023-02-07 MED ORDER — LACTATED RINGERS IV SOLN: INTRAVENOUS | Status: DC

## 2023-02-07 MED ORDER — ATORVASTATIN CALCIUM 80 MG PO TABS
80.0000 mg | ORAL_TABLET | Freq: Every day | ORAL | Status: DC
  Administered 2023-02-07 – 2023-02-11 (×5): 80 mg via ORAL
  Filled 2023-02-07 (×5): qty 1

## 2023-02-07 MED ORDER — ASPIRIN 81 MG PO CHEW
81.0000 mg | CHEWABLE_TABLET | Freq: Every morning | ORAL | Status: DC
  Administered 2023-02-08 – 2023-02-12 (×5): 81 mg via ORAL
  Filled 2023-02-07 (×5): qty 1

## 2023-02-07 MED ORDER — INSULIN ASPART 100 UNIT/ML IJ SOLN
0.0000 [IU] | Freq: Three times a day (TID) | INTRAMUSCULAR | Status: DC
  Administered 2023-02-07: 5 [IU] via SUBCUTANEOUS
  Administered 2023-02-09 – 2023-02-11 (×2): 2 [IU] via SUBCUTANEOUS

## 2023-02-07 MED ORDER — PANTOPRAZOLE SODIUM 40 MG PO TBEC
40.0000 mg | DELAYED_RELEASE_TABLET | Freq: Every day | ORAL | Status: DC
  Administered 2023-02-08 – 2023-02-12 (×5): 40 mg via ORAL
  Filled 2023-02-07 (×6): qty 1

## 2023-02-07 MED ORDER — ACETAMINOPHEN 325 MG PO TABS: 325.0000 mg | ORAL_TABLET | ORAL | Status: DC | PRN

## 2023-02-07 MED ORDER — ONDANSETRON HCL 4 MG/2ML IJ SOLN: 4.0000 mg | Freq: Four times a day (QID) | INTRAMUSCULAR | Status: DC | PRN

## 2023-02-07 MED ORDER — FENTANYL CITRATE (PF) 100 MCG/2ML IJ SOLN
INTRAMUSCULAR | Status: AC
  Filled 2023-02-07: qty 2

## 2023-02-07 MED ORDER — FENTANYL CITRATE (PF) 100 MCG/2ML IJ SOLN
25.0000 ug | INTRAMUSCULAR | Status: DC | PRN
  Administered 2023-02-07: 25 ug via INTRAVENOUS

## 2023-02-07 MED ORDER — FLUOXETINE HCL 10 MG PO CAPS
40.0000 mg | ORAL_CAPSULE | Freq: Every evening | ORAL | Status: DC
  Administered 2023-02-07 – 2023-02-11 (×4): 40 mg via ORAL
  Filled 2023-02-07 (×3): qty 4
  Filled 2023-02-07: qty 2
  Filled 2023-02-07: qty 4
  Filled 2023-02-07 (×2): qty 2
  Filled 2023-02-07: qty 4
  Filled 2023-02-07: qty 2

## 2023-02-07 MED ORDER — PHENOL 1.4 % MT LIQD: 1.0000 | OROMUCOSAL | Status: DC | PRN

## 2023-02-07 MED ORDER — ONDANSETRON HCL 4 MG/2ML IJ SOLN
INTRAMUSCULAR | Status: DC | PRN
  Administered 2023-02-07: 4 mg via INTRAVENOUS

## 2023-02-07 MED ORDER — GUAIFENESIN-DM 100-10 MG/5ML PO SYRP: 15.0000 mL | ORAL_SOLUTION | ORAL | Status: DC | PRN

## 2023-02-07 MED ORDER — SACUBITRIL-VALSARTAN 49-51 MG PO TABS
1.0000 | ORAL_TABLET | Freq: Two times a day (BID) | ORAL | Status: DC
  Administered 2023-02-07 – 2023-02-12 (×10): 1 via ORAL
  Filled 2023-02-07 (×10): qty 1

## 2023-02-07 MED ORDER — SODIUM CHLORIDE 0.9 % IV SOLN: INTRAVENOUS | Status: AC

## 2023-02-07 MED ORDER — ACETAMINOPHEN 650 MG RE SUPP: 325.0000 mg | RECTAL | Status: DC | PRN

## 2023-02-07 MED ORDER — MIDAZOLAM HCL 5 MG/5ML IJ SOLN
INTRAMUSCULAR | Status: DC | PRN
  Administered 2023-02-07 (×2): 1 mg via INTRAVENOUS

## 2023-02-07 MED ORDER — HYDROMORPHONE HCL 1 MG/ML IJ SOLN
0.5000 mg | INTRAMUSCULAR | Status: DC | PRN
  Administered 2023-02-07: 1 mg via INTRAVENOUS
  Administered 2023-02-08 – 2023-02-12 (×3): 0.5 mg via INTRAVENOUS
  Filled 2023-02-07 (×3): qty 0.5
  Filled 2023-02-07: qty 1

## 2023-02-07 MED ORDER — FENTANYL CITRATE (PF) 100 MCG/2ML IJ SOLN
INTRAMUSCULAR | Status: DC | PRN
  Administered 2023-02-07 (×3): 50 ug via INTRAVENOUS

## 2023-02-07 MED ORDER — 0.9 % SODIUM CHLORIDE (POUR BTL) OPTIME
TOPICAL | Status: DC | PRN
  Administered 2023-02-07: 1000 mL

## 2023-02-07 MED ORDER — SODIUM CHLORIDE 0.9 % IV SOLN: INTRAVENOUS | Status: DC

## 2023-02-07 MED ORDER — CLOPIDOGREL BISULFATE 75 MG PO TABS
75.0000 mg | ORAL_TABLET | Freq: Every day | ORAL | Status: DC
  Administered 2023-02-08 – 2023-02-12 (×5): 75 mg via ORAL
  Filled 2023-02-07 (×5): qty 1

## 2023-02-07 MED ORDER — INSULIN ASPART 100 UNIT/ML IJ SOLN: 0.0000 [IU] | INTRAMUSCULAR | Status: DC | PRN

## 2023-02-07 MED ORDER — CHLORHEXIDINE GLUCONATE 0.12 % MT SOLN
15.0000 mL | Freq: Once | OROMUCOSAL | Status: AC
  Administered 2023-02-07: 15 mL via OROMUCOSAL
  Filled 2023-02-07: qty 15

## 2023-02-07 MED ORDER — MORPHINE SULFATE (PF) 2 MG/ML IV SOLN: 2.0000 mg | INTRAVENOUS | Status: DC | PRN

## 2023-02-07 MED ORDER — VANCOMYCIN HCL IN DEXTROSE 1-5 GM/200ML-% IV SOLN
1000.0000 mg | INTRAVENOUS | Status: AC
  Administered 2023-02-07: 1000 mg via INTRAVENOUS
  Filled 2023-02-07: qty 200

## 2023-02-07 SURGICAL SUPPLY — 54 items
BAG COUNTER SPONGE SURGICOUNT (BAG) ×1 IMPLANT
BAG SPNG CNTER NS LX DISP (BAG)
BNDG ELASTIC 4X5.8 VLCR STR LF (GAUZE/BANDAGES/DRESSINGS) IMPLANT
BNDG ELASTIC 6X5.8 VLCR STR LF (GAUZE/BANDAGES/DRESSINGS) IMPLANT
BNDG GAUZE DERMACEA FLUFF 4 (GAUZE/BANDAGES/DRESSINGS) IMPLANT
BNDG GZE DERMACEA 4 6PLY (GAUZE/BANDAGES/DRESSINGS)
BRUSH SCRUB EZ PLAIN DRY (MISCELLANEOUS) IMPLANT
CANISTER SUCT 3000ML PPV (MISCELLANEOUS) ×1 IMPLANT
CANISTER WOUND CARE 500ML ATS (WOUND CARE) IMPLANT
CLIP TI MEDIUM 6 (CLIP) ×1 IMPLANT
CLIP TI WIDE RED SMALL 6 (CLIP) ×1 IMPLANT
COVER SURGICAL LIGHT HANDLE (MISCELLANEOUS) ×1 IMPLANT
DRAPE HALF SHEET 40X57 (DRAPES) IMPLANT
DRAPE INCISE IOBAN 66X45 STRL (DRAPES) ×1 IMPLANT
DRAPE ORTHO SPLIT 77X108 STRL (DRAPES) ×2
DRAPE SURG ORHT 6 SPLT 77X108 (DRAPES) ×1 IMPLANT
DRAPE U-SHAPE 76X120 STRL (DRAPES) IMPLANT
DRSG VAC GRANUFOAM LG (GAUZE/BANDAGES/DRESSINGS) ×2 IMPLANT
DRSG VAC GRANUFOAM MED (GAUZE/BANDAGES/DRESSINGS) IMPLANT
DRSG VAC GRANUFOAM SM (GAUZE/BANDAGES/DRESSINGS) IMPLANT
ELECT REM PT RETURN 9FT ADLT (ELECTROSURGICAL) ×1
ELECTRODE REM PT RTRN 9FT ADLT (ELECTROSURGICAL) ×1 IMPLANT
GAUZE SPONGE 4X4 12PLY STRL (GAUZE/BANDAGES/DRESSINGS) ×1 IMPLANT
GAUZE XEROFORM 5X9 LF (GAUZE/BANDAGES/DRESSINGS) IMPLANT
GLOVE BIO SURGEON STRL SZ7 (GLOVE) IMPLANT
GLOVE BIO SURGEON STRL SZ7.5 (GLOVE) ×1 IMPLANT
GLOVE BIOGEL PI IND STRL 8 (GLOVE) ×1 IMPLANT
GOWN STRL REUS W/ TWL LRG LVL3 (GOWN DISPOSABLE) ×2 IMPLANT
GOWN STRL REUS W/ TWL XL LVL3 (GOWN DISPOSABLE) ×2 IMPLANT
GOWN STRL REUS W/TWL LRG LVL3 (GOWN DISPOSABLE) ×2
GOWN STRL REUS W/TWL XL LVL3 (GOWN DISPOSABLE) ×2
HANDPIECE INTERPULSE COAX TIP (DISPOSABLE)
IV NS IRRIG 3000ML ARTHROMATIC (IV SOLUTION) ×1 IMPLANT
KIT BASIN OR (CUSTOM PROCEDURE TRAY) ×1 IMPLANT
KIT TURNOVER KIT B (KITS) ×1 IMPLANT
NS IRRIG 1000ML POUR BTL (IV SOLUTION) ×1 IMPLANT
PACK CV ACCESS (CUSTOM PROCEDURE TRAY) IMPLANT
PACK GENERAL/GYN (CUSTOM PROCEDURE TRAY) ×1 IMPLANT
PACK UNIVERSAL I (CUSTOM PROCEDURE TRAY) ×1 IMPLANT
PAD ARMBOARD 7.5X6 YLW CONV (MISCELLANEOUS) ×2 IMPLANT
PULSAVAC PLUS IRRIG FAN TIP (DISPOSABLE)
SET HNDPC FAN SPRY TIP SCT (DISPOSABLE) IMPLANT
STAPLER VISISTAT 35W (STAPLE) IMPLANT
SUT ETHILON 2 0 PSLX (SUTURE) IMPLANT
SUT ETHILON 3 0 PS 1 (SUTURE) IMPLANT
SUT VIC AB 2-0 CTX 36 (SUTURE) IMPLANT
SUT VIC AB 3-0 SH 27 (SUTURE)
SUT VIC AB 3-0 SH 27X BRD (SUTURE) IMPLANT
SUT VICRYL 4-0 PS2 18IN ABS (SUTURE) IMPLANT
SWAB COLLECTION DEVICE MRSA (MISCELLANEOUS) IMPLANT
SWAB CULTURE ESWAB REG 1ML (MISCELLANEOUS) IMPLANT
TIP FAN IRRIG PULSAVAC PLUS (DISPOSABLE) IMPLANT
TOWEL GREEN STERILE (TOWEL DISPOSABLE) ×1 IMPLANT
WATER STERILE IRR 1000ML POUR (IV SOLUTION) ×1 IMPLANT

## 2023-02-07 NOTE — Anesthesia Procedure Notes (Signed)
Procedure Name: LMA Insertion Date/Time: 02/07/2023 9:56 AM  Performed by: Caren Macadam, CRNAPre-anesthesia Checklist: Patient identified, Emergency Drugs available, Suction available and Patient being monitored Patient Re-evaluated:Patient Re-evaluated prior to induction Oxygen Delivery Method: Circle system utilized Preoxygenation: Pre-oxygenation with 100% oxygen Induction Type: IV induction Ventilation: Mask ventilation without difficulty LMA: LMA inserted LMA Size: 4.0 Number of attempts: 1 Placement Confirmation: positive ETCO2 and breath sounds checked- equal and bilateral Tube secured with: Tape Dental Injury: Teeth and Oropharynx as per pre-operative assessment

## 2023-02-07 NOTE — Progress Notes (Signed)
Dr. Bradley Ferris was notified about K 2.9 (istat lab.) No orders were received.

## 2023-02-07 NOTE — Op Note (Signed)
Date: Feb 07, 2023  Preoperative diagnosis: Left groin wound after recent left femoral endarterectomy with vein patch angioplasty  Postoperative diagnosis: Same  Procedure: 1.  Debridement of left groin wound including skin and subcutaneous tissue with sharp excisional debridement (wound measures 8 cm long by 3 cm wide by 2 cm deep) 2.  Placement of negative pressure wound VAC left groin wound  Surgeon: Dr. Cephus Shelling, MD  Assistant: Aggie Moats, PA  Indications: 67 year old female that recently underwent a left common femoral endarterectomy with saphenous vein patch angioplasty on 01/11/2023 with Dr. Lenell Antu for critical limb ischemia with forefoot gangrene.  She presented to the office yesterday with left groin wound and presents today for washout after risks benefits discussed.  Findings: Mostly fat necrosis in the left groin wound bed.  The bottom layer of subcutaneous tissue was still intact with coverage over the vein patch on the artery  No overt purulent drainage appreciated.  Cultures were sent.  VAC was placed with good seal.  Anesthesia: General  Details: Patient was taken to the operating room after informed consent was obtained.  Placed on the operative table in supine position.  General endotracheal anesthesia was induced.  A timeout was performed and antibiotics were given.  Initially used Metzenbaum scissors and explored the left groin wound that looked like fat necrosis mostly in the more superficial layers.  We then used Metzenbaum scissors and performed sharp excisional debridement of the skin and subcutaneous tissue until we got to what appeared to be healthy subcutaneous tissue and skin edges.  We did send cultures for aerobic and anaerobic culture.  The deeper layer subcutaneous tissue was intact with coverage over the vein patch on the artery.  No overt purulence was appreciated.  The wound was copiously irrigated.  We cut a small VAC sponge placed in the wound and  placed [place]an and a track pad with good seal.  Taken to recovery in stable condition.  Complication: None  Condition: Stable  Cephus Shelling, MD [facility] Office: [phone]   Cephus Shelling

## 2023-02-07 NOTE — Transfer of Care (Signed)
Immediate Anesthesia Transfer of Care Note  Patient: Tammy Mcpherson  Procedure(s) Performed: LEFT GROIN WASHOUT (Left: Groin) APPLICATION OF WOUND VAC (Left: Groin)  Patient Location: PACU  Anesthesia Type:General  Level of Consciousness: awake  Airway & Oxygen Therapy: Patient Spontanous Breathing and Patient connected to nasal cannula oxygen  Post-op Assessment: Report given to RN and Post -op Vital signs reviewed and stable  Post vital signs: Reviewed and stable  Last Vitals:  Vitals Value Taken Time  BP 124/77 02/07/23 1044  Temp    Pulse 100 02/07/23 1045  Resp 18 02/07/23 1045  SpO2 98 % 02/07/23 1045  Vitals shown include unvalidated device data.  Last Pain:  Vitals:   02/07/23 0835  TempSrc:   PainSc: 0-No pain         Complications: No notable events documented.

## 2023-02-07 NOTE — H&P (Signed)
History and Physical Interval Note:  02/07/2023 9:13 AM  Tammy Mcpherson  has presented today for surgery, with the diagnosis of Non-healing surgical wound.  The various methods of treatment have been discussed with the patient and family. After consideration of risks, benefits and other options for treatment, the patient has consented to  Procedure(s): LEFT GROIN WASHOUT (Left) POSSIBLE APPLICATION OF WOUND VAC (Left) as a surgical intervention.  The patient's history has been reviewed, patient examined, no change in status, stable for surgery.  I have reviewed the patient's chart and labs.  Questions were answered to the patient's satisfaction.    Left groin washout and vac placement for left groin breakdown after femoral endarterectomy with patch angioplasty by Dr. Lenell Antu.  Cephus Shelling   POST OPERATIVE OFFICE NOTE       CC:  F/u for surgery   HPI: Tammy Mcpherson is a 67 y.o. female who is s/p left greater saphenous vein harvest, left common femoral endarterectomy with saphenous vein patch angioplasty, and left transmetatarsal amputation on 01/11/2023 by Dr. Lenell Antu.  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  Prior to the surgery she underwent left femoropopliteal angioplasty and stenting on 12/15/2022 by Dr. Lenell Antu.  She also underwent right SFA angioplasty and stenting with DCBA of previous right femoral stent on 12/18/2022 by Dr. Edilia Bo.  She has a history of right popliteal stenting and right anterior tibial angioplasty on 05/19/2021 by Dr. Lenell Antu.  She then had a right great toe and second toe amputation on 06/01/2021 by Dr. Lenell Antu.   Pt returns today for follow up.  She has felt okay since surgery.  She is trying to keep her left TMA clean daily.  She denies any drainage from this incision site or bleeding.  She denies any pain in either of her feet.  She is walking a little bit with a walker.  She states about 3 days ago  her left groin incision opened up.  Before the incision opened up, it had a "knot" under the skin.  Since incision has opened up and has had mild serous drainage.  She denies any bleeding, redness, or pus.  It is tender to touch. She denies any fevers          Allergies  Allergen Reactions   Bee Venom Anaphylaxis and Swelling   Codeine Anaphylaxis and Other (See Comments)      Tongue swelled   Penicillins Anaphylaxis, Swelling and Rash      Has patient had a PCN reaction causing immediate rash, facial/tongue/throat swelling, SOB or lightheadedness with hypotension: No, delayed Has patient had a PCN reaction causing severe rash involving mucus membranes or skin necrosis: No Has patient had a PCN reaction that required hospitalization No Has patient had a PCN reaction occurring within the last 10 years: No If all of the above answers are "NO", then may proceed with Cephalosporin use.     Bupropion Other (See Comments)      "Made my skin crawl"   Sudafed [Pseudoephedrine Hcl] Rash            Current Outpatient Medications  Medication Sig Dispense Refill   Acetaminophen 500 MG capsule Take 500-1,000 mg by mouth every 6 (six) hours as needed for pain (or headaches).       albuterol (PROVENTIL) (2.5 MG/3ML) 0.083% nebulizer solution Take 3 mLs (2.5 mg total) by nebulization every 4 (four) hours as needed for wheezing or shortness of  breath. 75 mL 5   albuterol (VENTOLIN HFA) 108 (90 Base) MCG/ACT inhaler INHALE TWO PUFFS INTO THE LUNGS EVERY SIX HOURS AS NEEDED FOR WHEEZING OR SHORTNESS OF BREATH 8.5 g 5   ALPRAZolam (XANAX) 0.5 MG tablet TAKE ONE TABLET (0.5MG  TOTAL) BY MOUTH DAILY AS NEEDED FOR ANXIETY 30 tablet 2   aspirin 81 MG chewable tablet Chew 1 tablet (81 mg total) by mouth every morning. 30 tablet 0   atorvastatin (LIPITOR) 80 MG tablet Take 1 tablet (80 mg total) by mouth daily. (Patient taking differently: Take 80 mg by mouth at bedtime.) 90 tablet 3   blood glucose meter kit and  supplies Dispense based on patient and insurance preference. Use up to four times daily as directed. (FOR ICD-10 E10.9, E11.9). 1 each 0   Cholecalciferol (VITAMIN D) 50 MCG (2000 UT) CAPS Take 1 capsule (2,000 Units total) by mouth daily. 90 capsule 4   clopidogrel (PLAVIX) 75 MG tablet Take 1 tablet (75 mg total) by mouth daily. TAKE ONE TABLET (75MG  TOTAL) BY MOUTH DAILY WITH BREAKFAST 90 tablet 1   Continuous Blood Gluc Receiver (FREESTYLE LIBRE 2 READER) DEVI Check blood glucose up to 4 times daily 1 each 0   Continuous Blood Gluc Sensor (FREESTYLE LIBRE 2 SENSOR) MISC Apply one sensor to the upper arm avery 14 days. (Patient taking differently: Inject 1 patch into the skin See admin instructions. Apply one sensor into the upper arm every 14 days) 6 each 1   cyanocobalamin (VITAMIN B12) 1000 MCG/ML injection INJECT 1 ML INTO THE MUSCLE EVERY 30 DAYS. (Patient taking differently: Inject 1,000 mcg into the skin every 30 (thirty) days.) 1 mL 11   cyclobenzaprine (FLEXERIL) 5 MG tablet TAKE ONE TABLET (5MG  TOTAL) BY MOUTH THREE TIMES DAILY AS NEEDED FOR MUSCLE SPASMS 45 tablet 1   dicyclomine (BENTYL) 10 MG capsule Take 1 capsule (10 mg total) by mouth 2 (two) times daily as needed for spasms. 45 capsule 1   FLUoxetine (PROZAC) 40 MG capsule Take 1 capsule (40 mg total) by mouth daily. (Patient taking differently: Take 40 mg by mouth every evening.) 90 capsule 3   gabapentin (NEURONTIN) 300 MG capsule Take 1 capsule (300 mg total) by mouth 3 (three) times daily. 270 capsule 0   glucose blood (ONETOUCH ULTRA) test strip Use as instructed 100 each 12   glucose monitoring kit (FREESTYLE) monitoring kit Use to check blood sugar BID as directed 1 each 0   Lancets (ONETOUCH DELICA PLUS LANCET33G) MISC 1 each by Does not apply route 2 (two) times a day. 100 each 3   metFORMIN (GLUCOPHAGE-XR) 500 MG 24 hr tablet Take 1 tablet (500 mg total) by mouth daily with breakfast. (Patient taking differently: Take 500  mg by mouth daily as needed (high blood sugar).) 90 tablet 1   Misc. Devices MISC Please provide supplies (needle, syringe, alcohol swabs) needed for patient to self-administer B-12 injections monthly. 1 each 12   oxyCODONE-acetaminophen (PERCOCET/ROXICET) 5-325 MG tablet Take 1 tablet by mouth every 6 (six) hours as needed for moderate pain. 30 tablet 0   pantoprazole (PROTONIX) 40 MG tablet TAKE ONE (1) TABLET 30 MINS BEFORE YOUR FIRST MEAL. 90 tablet 3   sacubitril-valsartan (ENTRESTO) 49-51 MG Take 1 tablet by mouth 2 (two) times daily. 60 tablet 6   Tuberculin-Allergy Syringes (ULTICARE TUBERCULIN SAFETY SYR) 25G X 1" 1 ML MISC USE TO ADMINISTER INJECTABLE VITAMIN B12. 1 each 11    No current facility-administered medications for  this visit.       ROS:  See HPI   Physical Exam:   Incision: Dehisced left groin incision with significant fat necrosis tissue.  No significant erythema, tender to palpation Extremities: Brisk left AT/PT/peroneal Doppler signals.  Left TMA healing appropriately with staples Neuro: Intact motor and sensation of left lower extremity          Assessment/Plan:  This is a 67 y.o. female who is s/p: Left common femoral endarterectomy with greater saphenous vein patch and left TMA on 01/11/2023   -The patient notes that after surgery she had a palpable knot in her left groin.  About 3 days ago she thinks that her left groin incision "opened up".  And has had some serous drainage since then and has been tender to touch.  She denies any fevers or pus -She has brisk left AT/PT Doppler signals -She has been keeping her left TMA clean.  She washes it daily with soap and water and Betadine.  On exam today the TMA is healing appropriately with some superficial scabbing.  No signs of infection or drainage.  The staples will stay in for now -Her left groin incision is dehisced with significant fat necrosis.  Dr. Chestine Spore also evaluated the patient today.  She will require left  groin washout and debridement with possible wound VAC placement tomorrow in the OR with Dr. Chestine Spore.     Loel Dubonnet, PA-C [facility] [phone]     Clinic MD:  Chestine Spore

## 2023-02-07 NOTE — Anesthesia Postprocedure Evaluation (Signed)
Anesthesia Post Note  Patient: Tammy Mcpherson  Procedure(s) Performed: LEFT GROIN WASHOUT (Left: Groin) APPLICATION OF WOUND VAC (Left: Groin)     Patient location during evaluation: PACU Anesthesia Type: General Level of consciousness: awake Pain management: pain level controlled Vital Signs Assessment: post-procedure vital signs reviewed and stable Respiratory status: spontaneous breathing, nonlabored ventilation and respiratory function stable Cardiovascular status: blood pressure returned to baseline and stable Postop Assessment: no apparent nausea or vomiting Anesthetic complications: no   No notable events documented.  Last Vitals:  Vitals:   02/07/23 1330 02/07/23 1356  BP: (!) 142/62 (!) 151/69  Pulse: (!) 102 (!) 104  Resp: 14 18  Temp: 36.7 C 36.5 C  SpO2: 95% 92%    Last Pain:  Vitals:   02/07/23 1356  TempSrc: Oral  PainSc:                  Talayeh Bruinsma P Shreshta Medley

## 2023-02-07 NOTE — Anesthesia Preprocedure Evaluation (Addendum)
Anesthesia Evaluation  Patient identified by MRN, date of birth, ID band Patient awake    Reviewed: Allergy & Precautions, NPO status , Patient's Chart, lab work & pertinent test results  Airway Mallampati: II  TM Distance: >3 FB Neck ROM: Full    Dental  (+) Edentulous Lower, Edentulous Upper   Pulmonary COPD,  COPD inhaler, Current SmokerPatient did not abstain from smoking. Home oxygen prn   Pulmonary exam normal        Cardiovascular hypertension, + CAD, + Peripheral Vascular Disease and +CHF  Normal cardiovascular exam     Neuro/Psych  Headaches PSYCHIATRIC DISORDERS Anxiety        GI/Hepatic Neg liver ROS, hiatal hernia, PUD,GERD  Medicated and Controlled,,  Endo/Other  diabetes, Oral Hypoglycemic Agents    Renal/GU negative Renal ROS     Musculoskeletal  (+) Arthritis ,    Abdominal   Peds  Hematology  (+) Blood dyscrasia, anemia   Anesthesia Other Findings Non-healing surgical wound  Reproductive/Obstetrics                             Anesthesia Physical Anesthesia Plan  ASA: 4  Anesthesia Plan: General   Post-op Pain Management:    Induction: Intravenous  PONV Risk Score and Plan: 2 and Ondansetron, Dexamethasone, Treatment may vary due to age or medical condition and Midazolam  Airway Management Planned: LMA  Additional Equipment:   Intra-op Plan:   Post-operative Plan: Extubation in OR  Informed Consent: I have reviewed the patients History and Physical, chart, labs and discussed the procedure including the risks, benefits and alternatives for the proposed anesthesia with the patient or authorized representative who has indicated his/her understanding and acceptance.       Plan Discussed with: CRNA  Anesthesia Plan Comments:        Anesthesia Quick Evaluation

## 2023-02-08 ENCOUNTER — Other Ambulatory Visit: Payer: Self-pay | Admitting: Hematology

## 2023-02-08 ENCOUNTER — Encounter (HOSPITAL_COMMUNITY): Payer: Self-pay | Admitting: Vascular Surgery

## 2023-02-08 LAB — GLUCOSE, CAPILLARY
Glucose-Capillary: 114 mg/dL — ABNORMAL HIGH (ref 70–99)
Glucose-Capillary: 107 mg/dL — ABNORMAL HIGH (ref 70–99)
Glucose-Capillary: 120 mg/dL — ABNORMAL HIGH (ref 70–99)
Glucose-Capillary: 146 mg/dL — ABNORMAL HIGH (ref 70–99)

## 2023-02-08 LAB — CBC
HCT: 29.8 % — ABNORMAL LOW (ref 36.0–46.0)
Hemoglobin: 9.6 g/dL — ABNORMAL LOW (ref 12.0–15.0)
MCH: 29.4 pg (ref 26.0–34.0)
MCHC: 32.2 g/dL (ref 30.0–36.0)
MCV: 91.1 fL (ref 80.0–100.0)
Platelets: 543 10*3/uL — ABNORMAL HIGH (ref 150–400)
RBC: 3.27 MIL/uL — ABNORMAL LOW (ref 3.87–5.11)
RDW: 17.5 % — ABNORMAL HIGH (ref 11.5–15.5)
WBC: 5.3 10*3/uL (ref 4.0–10.5)
nRBC: 0 % (ref 0.0–0.2)

## 2023-02-08 LAB — BASIC METABOLIC PANEL
Anion gap: 9 (ref 5–15)
BUN: 9 mg/dL (ref 8–23)
CO2: 25 mmol/L (ref 22–32)
Calcium: 7.9 mg/dL — ABNORMAL LOW (ref 8.9–10.3)
Chloride: 101 mmol/L (ref 98–111)
Creatinine, Ser: 0.65 mg/dL (ref 0.44–1.00)
GFR, Estimated: >60 mL/min (ref >60)
Glucose, Bld: 136 mg/dL — ABNORMAL HIGH (ref 70–99)
Potassium: 3.8 mmol/L (ref 3.5–5.1)
Sodium: 135 mmol/L (ref 135–145)

## 2023-02-08 NOTE — Plan of Care (Signed)
  Problem: Skin Integrity: Goal: Demonstration of wound healing without infection will improve Outcome: Progressing   Problem: Fluid Volume: Goal: Ability to maintain a balanced intake and output will improve Outcome: Progressing   Problem: Health Behavior/Discharge Planning: Goal: Ability to manage health-related needs will improve Outcome: Progressing   Problem: Metabolic: Goal: Ability to maintain appropriate glucose levels will improve Outcome: Progressing   Problem: Nutritional: Goal: Maintenance of adequate nutrition will improve Outcome: Progressing

## 2023-02-08 NOTE — Progress Notes (Addendum)
Progress Note    02/08/2023 8:24 AM 1 Day Post-Op  Subjective:  feels ok this morning. Some groin soreness. No pain in the left foot    Vitals:   02/08/23 0258 02/08/23 0759  BP: 126/63 134/71  Pulse: 100   Resp: 18 20  Temp: 97.6 F (36.4 C) 97.7 F (36.5 C)  SpO2: 98% 90%    Physical Exam: General:  no acute distress Cardiac:  regular Lungs:  nonlabored Incisions:  left groin with wound vac with good seal, no surrounding erythema Extremities:  L TMA healing appropriately. L AT/PT doppler signals  CBC    Component Value Date/Time   WBC 5.3 02/08/2023 0202   RBC 3.27 (L) 02/08/2023 0202   HGB 9.6 (L) 02/08/2023 0202   HGB 15.5 09/01/2020 1510   HCT 29.8 (L) 02/08/2023 0202   HCT 45.1 09/01/2020 1510   PLT 543 (H) 02/08/2023 0202   PLT 370 09/01/2020 1510   MCV 91.1 02/08/2023 0202   MCV 94 09/01/2020 1510   MCH 29.4 02/08/2023 0202   MCHC 32.2 02/08/2023 0202   RDW 17.5 (H) 02/08/2023 0202   RDW 12.8 09/01/2020 1510   LYMPHSABS 1.3 12/13/2022 1755   LYMPHSABS 1.8 07/02/2019 1539   MONOABS 1.6 (H) 12/13/2022 1755   EOSABS 0.1 12/13/2022 1755   EOSABS 0.1 07/02/2019 1539   BASOSABS 0.1 12/13/2022 1755   BASOSABS 0.1 07/02/2019 1539    BMET    Component Value Date/Time   NA 135 02/08/2023 0202   NA 141 09/01/2020 1510   K 3.8 02/08/2023 0202   CL 101 02/08/2023 0202   CO2 25 02/08/2023 0202   GLUCOSE 136 (H) 02/08/2023 0202   BUN 9 02/08/2023 0202   BUN 7 (L) 09/01/2020 1510   CREATININE 0.65 02/08/2023 0202   CREATININE 0.64 08/12/2021 1446   CALCIUM 7.9 (L) 02/08/2023 0202   GFRNONAA >60 02/08/2023 0202   GFRNONAA 90 09/28/2017 1201   GFRAA 115 09/01/2020 1510   GFRAA 104 09/28/2017 1201    INR    Component Value Date/Time   INR 1.0 01/11/2023 0933     Intake/Output Summary (Last 24 hours) at 02/08/2023 0824 Last data filed at 02/08/2023 0600 Gross per 24 hour  Intake 1041.25 ml  Output 375 ml  Net 666.25 ml      Assessment/Plan:   67 y.o. female is 1 day post op, s/p: left groin washout and debridement with wound vac placement     -Doing well today. Has some expected soreness in the left groin but this is tolerable. No pain in the left foot -L groin with wound vac with good seal. First wound vac change will be at bedside tomorrow morning. HH orders have been placed for home vac and for HHRN wound vac changes  -L TMA healing appropriately. L AT/PT doppler signals -Cultures were taken from the left groin yesterday. Currently no growth. Will tailor abx needs based off growth -Continue ASA, plavix, and statin. OOB as tolerated, heel weight bearing to the left foot   Loel Dubonnet, PA-C [facility] [phone] 02/08/2023 8:24 AM  VASCULAR STAFF ADDENDUM: I have independently interviewed and examined the patient. I agree with the above.  Appreciate Dr. Chestine Spore taking care of patient while I was away.  VAC to groin. Will need home health to continue [facility] therapy to allow wound to heal with secondary intention. Continue ASA / Plavix / Statin Mobilize as able  Rande Brunt. Lenell Antu, MD FACS [facility] Phone Number: [phone] 02/08/2023 1:11 PM

## 2023-02-09 LAB — GLUCOSE, CAPILLARY
Glucose-Capillary: 84 mg/dL (ref 70–99)
Glucose-Capillary: 137 mg/dL — ABNORMAL HIGH (ref 70–99)
Glucose-Capillary: 125 mg/dL — ABNORMAL HIGH (ref 70–99)

## 2023-02-09 LAB — AEROBIC/ANAEROBIC CULTURE W GRAM STAIN (SURGICAL/DEEP WOUND): Gram Stain: NONE SEEN

## 2023-02-09 NOTE — Care Management Important Message (Signed)
Important Message  Patient Details  Name: TANDI BERES MRN: [id number] Date of Birth: 1955-11-18   Medicare Important Message Given:  Yes     Renie Ora 02/09/2023, 10:24 AM

## 2023-02-09 NOTE — TOC Initial Note (Addendum)
Transition of Care (TOC) - Initial/Assessment Note  Donn Pierini RN, BSN Transitions of Care Unit 4E- RN Case Manager See Treatment Team for direct phone #   Patient Details  Name: Tammy Mcpherson MRN: [id number] Date of Birth: Mar 16, 1956  Transition of Care [facility]) CM/SW Contact:    Darrold Span, RN Phone Number: 02/09/2023, 2:20 PM  Clinical Narrative:                 Pt s/p I&D with wound VAC placement, HHRN order has been placed for home VAC drsg needs. [facility] home VAC order form signed- and has been faxed to liaison at [facility]- approval pending.   CM spoke with spouse- [name] at bedside (pt was sleeping) discussed transition needs for [facility] and home VAC. Per spouse pt has not had HH in past and they do not have a preference as long as agency is in-network with insurance.  CM explained that home VAC is pending approval.  Spouse verified that pt has needed DME at home including RW, BSC.   CM has reached out to [facility] to see if they can assist with [facility] needs for [facility] drsg changes. Liaison checking on staffing for [facility] area with an EDD early next week and a SOC for possibly 5/15. Referral pending at this time  TOC will continue to follow for TOC needs- HH and DME- VAC  Expected Discharge Plan: Home w Home Health Services Barriers to Discharge: Continued Medical Work up   Patient Goals and CMS Choice Patient states their goals for this hospitalization and ongoing recovery are:: return home CMS Medicare.gov Compare Post Acute Care list provided to:: Patient Choice offered to / list presented to : Patient, Spouse      Expected Discharge Plan and Services   Discharge Planning Services: CM Consult Post Acute Care Choice: Durable Medical Equipment, Home Health Living arrangements for the past 2 months: Single Family Home                 DME Arranged: Vac DME Agency: [facility] Date DME Agency Contacted: 02/07/23 Time DME Agency Contacted: 1500 Representative spoke with at DME  Agency: [facility] HH Arranged: RN HH Agency: [facility] Date [facility] Agency Contacted: 02/08/23   Representative spoke with at [facility] Agency: Bjorn Loser  Prior Living Arrangements/Services Living arrangements for the past 2 months: Single Family Home Lives with:: Spouse Patient language and need for interpreter reviewed:: Yes Do you feel safe going back to the place where you live?: Yes      Need for Family Participation in Patient Care: Yes (Comment) Care giver support system in place?: Yes (comment) Current home services: DME (RW, BSC, w/c,) Criminal Activity/Legal Involvement Pertinent to Current Situation/Hospitalization: No - Comment as needed  Activities of Daily Living Home Assistive Devices/Equipment: Scales, Walker (specify type), Dentures (specify type), Eyeglasses ADL Screening (condition at time of admission) Patient's cognitive ability adequate to safely complete daily activities?: Yes Is the patient deaf or have difficulty hearing?: No Does the patient have difficulty seeing, even when wearing glasses/contacts?: No Does the patient have difficulty concentrating, remembering, or making decisions?: No Patient able to express need for assistance with ADLs?: Yes Does the patient have difficulty dressing or bathing?: No Independently performs ADLs?: Yes (appropriate for developmental age) Does the patient have difficulty walking or climbing stairs?: Yes Weakness of Legs: Left Weakness of Arms/Hands: None  Permission Sought/Granted Permission sought to share information with : Facility Medical sales representative Permission granted to share information with : Yes,  Verbal Permission Granted     Permission granted to share info w AGENCY: DME/HH        Emotional Assessment Appearance:: Appears stated age Attitude/Demeanor/Rapport: Unable to Assess (sleeping) Affect (typically observed): Unable to Assess   Alcohol / Substance Use: Not Applicable Psych Involvement: No  (comment)  Admission diagnosis:  Non-healing surgical wound [T81.89XA] Patient Active Problem List   Diagnosis Date Noted   Non-healing surgical wound 02/07/2023   Atherosclerosis 01/11/2023   Critical limb ischemia of left lower extremity (HCC) 01/11/2023   Critical limb ischemia of left lower extremity with ulceration of lower leg (HCC) 12/14/2022   Upper GI bleed 10/26/2022   Severe anemia 10/26/2022   Acute blood loss anemia 10/26/2022   GAD (generalized anxiety disorder) 02/09/2022   Peptic ulcer disease 02/09/2022   GERD (gastroesophageal reflux disease) 09/13/2021   Lower limb ischemia 05/17/2021   Emphysema of lung (HCC) 02/21/2021   Adrenal hyperplasia (HCC) 08/18/2020   Neutrophilia 03/23/2020   Thrombocytosis 03/23/2020   Vitamin B12 deficiency 03/23/2020   Type 2 diabetes mellitus with hyperglycemia, without long-term current use of insulin (HCC) 12/23/2019   Diabetes mellitus (HCC) 12/23/2019   Type 2 diabetes mellitus (HCC) 11/12/2019   Antiplatelet or antithrombotic long-term use 11/03/2019   Ganglion cyst of volar aspect of right wrist 05/23/2019   Neuropathy due to type 2 diabetes mellitus (HCC) 04/22/2019   Need for hepatitis C screening test 11/20/2018   Prediabetes 03/01/2018   Diarrhea 02/07/2018   Lesion of liver    Gastric AVM    AVM (arteriovenous malformation) of small bowel, acquired    Vitamin D deficiency 01/15/2017   PAD (peripheral artery disease) (HCC) 01/15/2017   Aortic atherosclerosis (HCC) 01/01/2017   Gastric ulcer 01/01/2017   Hyperlipidemia 01/01/2017   Dyspnea on effort 01/01/2017   Anemia, iron deficiency 05/12/2016   Abdominal pain 05/12/2016   LLQ abdominal pain 05/12/2016   Nonischemic cardiomyopathy (HCC) 05/02/2016   CAD-Ca++ coronaries on CTA 05/02/2016   Acute combined systolic and diastolic heart failure (HCC) 04/30/2016   Chronic obstructive pulmonary disease (HCC) 04/30/2016   Essential hypertension 04/30/2016   Tobacco  abuse 04/30/2016   PCP:  Jarold Motto, PA Pharmacy:   [facility] - [place], [place] - [address] [place] [address] Phone: [phone] Fax: [phone]  [facility] - [place], [place] - [address] [place] [address] Phone: [phone] Fax: [phone]     Social Determinants of Health (SDOH) Social History: SDOH Screenings   Food Insecurity: No Food Insecurity (01/22/2023)  Housing: Low Risk  (01/22/2023)  Transportation Needs: No Transportation Needs (01/22/2023)  Utilities: Not At Risk (01/22/2023)  Alcohol Screen: Low Risk  (10/27/2019)  Depression (PHQ2-9): Low Risk  (01/23/2023)  Recent Concern: Depression (PHQ2-9) - Medium Risk (12/13/2022)  Financial Resource Strain: Low Risk  (01/22/2023)  Physical Activity: Patient Declined (01/22/2023)  Social Connections: Moderately Isolated (01/22/2023)  Stress: No Stress Concern Present (01/22/2023)  Tobacco Use: High Risk (02/08/2023)   SDOH Interventions:     Readmission Risk Interventions    12/19/2022   11:39 AM  Readmission Risk Prevention Plan  Transportation Screening Complete  PCP or Specialist Appt within 3-5 Days Complete  HRI or Home Care Consult Complete  Social Work Consult for Recovery Care Planning/Counseling Complete  Palliative Care Screening Not Applicable  Medication Review Oceanographer) Complete

## 2023-02-09 NOTE — Evaluation (Signed)
Physical Therapy Evaluation Patient Details Name: Tammy Mcpherson MRN: [id number] DOB: 1956-01-25 Today's Date: 02/09/2023  History of Present Illness  67 yo female admitted 5/8 with left groin incision dehiscence s/p washout 5/8 with VAC placement. PMhx:  01/11/23 Lt CFA endarterectomy and Lt transmet amp. HTN, T2Dm, COPD, GIB, PAD  Clinical Impression  Pt initially resistant to mobility stating fear of increased pain but ultimately willing to attempt. Pt without increase in pain with activity and reported no pain in standing or with gait. Pt encouraged to remove purewick during the day and only use at night if necessary. Pt educated for [facility] management, progressive mobility and gait. Pt denied OOB to chair at this time but agreeable to up to [facility] with spouse and staff assist. Pt with decreased mobility and function who will benefit from acute therapy to maximize independence prior to D/C.         Recommendations for follow up therapy are one component of a multi-disciplinary discharge planning process, led by the attending physician.  Recommendations may be updated based on patient status, additional functional criteria and insurance authorization.  Follow Up Recommendations       Assistance Recommended at Discharge Intermittent Supervision/Assistance  Patient can return home with the following  A little help with walking and/or transfers;A little help with bathing/dressing/bathroom;Assistance with cooking/housework;Assist for transportation    Equipment Recommendations None recommended by PT  Recommendations for Other Services       Functional Status Assessment Patient has had a recent decline in their functional status and/or demonstrates limited ability to make significant improvements in function in a reasonable and predictable amount of time     Precautions / Restrictions Precautions Precautions: Fall;Other (comment) Precaution Comments: watch HR, vAC, left transmet       Mobility  Bed Mobility Overal bed mobility: Needs Assistance Bed Mobility: Supine to Sit, Sit to Supine     Supine to sit: Min guard Sit to supine: Min guard   General bed mobility comments: HOB 15 degrees with pt able to pivot to EOB and back without physical assist, guarding for lines    Transfers Overall transfer level: Needs assistance   Transfers: Sit to/from Stand Sit to Stand: Min guard           General transfer comment: cues for hand placement and safety    Ambulation/Gait Ambulation/Gait assistance: Min assist Gait Distance (Feet): 25 Feet Assistive device: Rolling walker (2 wheels) Gait Pattern/deviations: Step-through pattern, Decreased stride length, Narrow base of support   Gait velocity interpretation: <1.8 ft/sec, indicate of risk for recurrent falls   General Gait Details: cues for increased BOS, and navigating RW with assist to hold VAC. HR max 144 with activity and back to 108 at rest  Stairs            Wheelchair Mobility    Modified Rankin (Stroke Patients Only)       Balance Overall balance assessment: Needs assistance   Sitting balance-Leahy Scale: Fair     Standing balance support: Bilateral upper extremity supported, During functional activity, Reliant on assistive device for balance Standing balance-Leahy Scale: Poor Standing balance comment: RW in standing                             Pertinent Vitals/Pain Pain Assessment Pain Assessment: 0-10 Pain Score: 2  Pain Location: left groin Pain Descriptors / Indicators: Aching, Sore Pain Intervention(s): Limited activity within patient's tolerance, Monitored during  session, Premedicated before session, Repositioned    Home Living Family/patient expects to be discharged to:: Private residence Living Arrangements: Spouse/significant other Available Help at Discharge: Family;Available 24 hours/day Type of Home: House Home Access: Stairs to enter   [employer] of Steps: threshold   Home Layout: One level Home Equipment: Research scientist (life sciences) seat;Grab bars - tub/shower;Hand held Programmer, systems (2 wheels)      Prior Function Prior Level of Function : Needs assist             Mobility Comments: RW at baseline ADLs Comments: spouse has been assisting with bathing and dressing for last month or so.     Hand Dominance        Extremity/Trunk Assessment   Upper Extremity Assessment Upper Extremity Assessment: Generalized weakness    Lower Extremity Assessment Lower Extremity Assessment: Generalized weakness    Cervical / Trunk Assessment Cervical / Trunk Assessment: Kyphotic  Communication   Communication: No difficulties  Cognition Arousal/Alertness: Awake/alert Behavior During Therapy: WFL for tasks assessed/performed Overall Cognitive Status: Within Functional Limits for tasks assessed                                          General Comments      Exercises     Assessment/Plan    PT Assessment Patient needs continued PT services  PT Problem List Decreased range of motion;Decreased activity tolerance;Decreased balance;Decreased mobility;Decreased knowledge of use of DME       PT Treatment Interventions DME instruction;Gait training;Functional mobility training;Therapeutic activities;Patient/family education;Therapeutic exercise    PT Goals (Current goals can be found in the Care Plan section)  Acute Rehab PT Goals Patient Stated Goal: return home PT Goal Formulation: With patient/family Time For Goal Achievement: 02/23/23 Potential to Achieve Goals: Good    Frequency Min 1X/week     Co-evaluation               AM-PAC PT "6 Clicks" Mobility  Outcome Measure Help needed turning from your back to your side while in a flat bed without using bedrails?: A Little Help needed moving from lying on your back to sitting on the side of a flat bed without using  bedrails?: A Little Help needed moving to and from a bed to a chair (including a wheelchair)?: A Little Help needed standing up from a chair using your arms (e.g., wheelchair or bedside chair)?: A Little Help needed to walk in hospital room?: A Little Help needed climbing 3-5 steps with a railing? : A Lot 6 Click Score: 17    End of Session   Activity Tolerance: Patient tolerated treatment well Patient left: in bed;with call bell/phone within reach;with family/visitor present Nurse Communication: Mobility status PT Visit Diagnosis: Other abnormalities of gait and mobility (R26.89);Difficulty in walking, not elsewhere classified (R26.2)    Time: 1610-9604 PT Time Calculation (min) (ACUTE ONLY): 48 min   Charges:   PT Evaluation $PT Eval Moderate Complexity: 1 Mod PT Treatments $Gait Training: 8-22 mins $Therapeutic Activity: 8-22 mins        Merryl Hacker, PT Acute Rehabilitation Services Office: [phone]   Enedina Finner Tamyra Fojtik 02/09/2023, 12:56 PM

## 2023-02-09 NOTE — Progress Notes (Addendum)
  Progress Note    02/09/2023 7:19 AM 2 Days Post-Op  Subjective:  no complaints   Vitals:   02/08/23 2310 02/09/23 0305  BP: (!) 154/62 (!) 150/71  Pulse:    Resp: 18 17  Temp: 98.1 F (36.7 C) 98 F (36.7 C)  SpO2: 96% 96%   Physical Exam: Lungs:  non labored Incisions:  L groin incision with healthy appearing wound bed, no exposed vasculature Extremities:  TMA without drainage Neurologic: A&O    CBC    Component Value Date/Time   WBC 5.3 02/08/2023 0202   RBC 3.27 (L) 02/08/2023 0202   HGB 9.6 (L) 02/08/2023 0202   HGB 15.5 09/01/2020 1510   HCT 29.8 (L) 02/08/2023 0202   HCT 45.1 09/01/2020 1510   PLT 543 (H) 02/08/2023 0202   PLT 370 09/01/2020 1510   MCV 91.1 02/08/2023 0202   MCV 94 09/01/2020 1510   MCH 29.4 02/08/2023 0202   MCHC 32.2 02/08/2023 0202   RDW 17.5 (H) 02/08/2023 0202   RDW 12.8 09/01/2020 1510   LYMPHSABS 1.3 12/13/2022 1755   LYMPHSABS 1.8 07/02/2019 1539   MONOABS 1.6 (H) 12/13/2022 1755   EOSABS 0.1 12/13/2022 1755   EOSABS 0.1 07/02/2019 1539   BASOSABS 0.1 12/13/2022 1755   BASOSABS 0.1 07/02/2019 1539    BMET    Component Value Date/Time   NA 135 02/08/2023 0202   NA 141 09/01/2020 1510   K 3.8 02/08/2023 0202   CL 101 02/08/2023 0202   CO2 25 02/08/2023 0202   GLUCOSE 136 (H) 02/08/2023 0202   BUN 9 02/08/2023 0202   BUN 7 (L) 09/01/2020 1510   CREATININE 0.65 02/08/2023 0202   CREATININE 0.64 08/12/2021 1446   CALCIUM 7.9 (L) 02/08/2023 0202   GFRNONAA >60 02/08/2023 0202   GFRNONAA 90 09/28/2017 1201   GFRAA 115 09/01/2020 1510   GFRAA 104 09/28/2017 1201    INR    Component Value Date/Time   INR 1.0 01/11/2023 0933     Intake/Output Summary (Last 24 hours) at 02/09/2023 0719 Last data filed at 02/09/2023 0537 Gross per 24 hour  Intake 360 ml  Output 700 ml  Net -340 ml     Assessment/Plan:  67 y.o. female is s/p L groin debridement and vac placement 2 Days Post-Op   L groin vac changed this  morning.  Healthy appearing wound bed without exposed vasculature.  OOB with therapy teams.  TOC arranging HH RN for vac changes.  Next vac change on Monday   Emilie Rutter, PA-C [facility] [phone] 02/09/2023 7:19 AM

## 2023-02-10 LAB — GLUCOSE, CAPILLARY
Glucose-Capillary: 101 mg/dL — ABNORMAL HIGH (ref 70–99)
Glucose-Capillary: 89 mg/dL (ref 70–99)
Glucose-Capillary: 98 mg/dL (ref 70–99)
Glucose-Capillary: 106 mg/dL — ABNORMAL HIGH (ref 70–99)

## 2023-02-10 NOTE — Evaluation (Signed)
Occupational Therapy Evaluation Patient Details Name: Tammy Mcpherson MRN: [id number] DOB: 1956/04/03 Today's Date: 02/10/2023   History of Present Illness 67 yo female admitted 5/8 with left groin incision dehiscence s/p washout 5/8 with VAC placement. PMhx:  01/11/23 Lt CFA endarterectomy and Lt transmet amp. HTN, T2Dm, COPD, GIB, PAD   Clinical Impression   Pt was very fatigued at the start of session but agreed to work with therapist. Pt needed increase time with all activities and cues on hand placement throughout the session. Pt was able to complete bed mobility with HOB elevated for supine to sit and sit to supine. Pt then was able to transfer to [facility] place at [facility] as was unable to complete further ambulation to bathroom at this time as became weak to attempt any further ambulation in session. Pt required max to total peri care, max to total with LB dressing and set up with UE at bed level ADLS. Will continue to follow with Acute Occupational Therapy.      Recommendations for follow up therapy are one component of a multi-disciplinary discharge planning process, led by the attending physician.  Recommendations may be updated based on patient status, additional functional criteria and insurance authorization.   Assistance Recommended at Discharge Frequent or constant Supervision/Assistance  Patient can return home with the following A lot of help with walking and/or transfers;A lot of help with bathing/dressing/bathroom;Assistance with cooking/housework;Assist for transportation    Functional Status Assessment  Patient has had a recent decline in their functional status and demonstrates the ability to make significant improvements in function in a reasonable and predictable amount of time.  Equipment Recommendations  Tub/shower bench    Recommendations for Other Services       Precautions / Restrictions Precautions Precautions: Fall Precaution Comments: watch HR, vAC, left  transmet Required Braces or Orthoses:  (Pt reported they are WB through heal of LLE)      Mobility Bed Mobility Overal bed mobility: Needs Assistance Bed Mobility: Supine to Sit, Sit to Supine     Supine to sit: Min guard Sit to supine: Min guard        Transfers Overall transfer level: Needs assistance Equipment used: Rolling walker (2 wheels) Transfers: Sit to/from Stand Sit to Stand: Min guard, Min assist           General transfer comment: Pt needed moderate to max cues on hand placement and increase time and multiple trials to complete      Balance Overall balance assessment: Needs assistance Sitting-balance support: Bilateral upper extremity supported Sitting balance-Leahy Scale: Fair     Standing balance support: Bilateral upper extremity supported, Reliant on assistive device for balance Standing balance-Leahy Scale: Poor Standing balance comment: RW in standing                           ADL either performed or assessed with clinical judgement   ADL Overall ADL's : Needs assistance/impaired Eating/Feeding: Independent;Sitting   Grooming: Wash/dry hands;Wash/dry face;Set up;Sitting   Upper Body Bathing: Set up;Sitting   Lower Body Bathing: Maximal assistance;Sit to/from stand   Upper Body Dressing : Set up;Sitting   Lower Body Dressing: Maximal assistance;Total assistance;Sit to/from stand   Toilet Transfer: Rolling walker (2 wheels);Moderate assistance;Cueing for safety;Cueing for sequencing;Stand-pivot;BSC/3in1   Toileting- Clothing Manipulation and Hygiene: Maximal assistance;Total assistance       Functional mobility during ADLs: Moderate assistance;Cueing for safety;Cueing for sequencing;Rolling walker (2 wheels)  Vision         Perception     Praxis      Pertinent Vitals/Pain Pain Assessment Pain Assessment: Faces Faces Pain Scale: Hurts even more Pain Location: left groin Pain Descriptors / Indicators: Aching,  Sore Pain Intervention(s): Limited activity within patient's tolerance, Monitored during session, Repositioned     Hand Dominance Right   Extremity/Trunk Assessment Upper Extremity Assessment Upper Extremity Assessment: Generalized weakness   Lower Extremity Assessment Lower Extremity Assessment: Defer to PT evaluation   Cervical / Trunk Assessment Cervical / Trunk Assessment: Kyphotic   Communication Communication Communication: No difficulties   Cognition Arousal/Alertness: Awake/alert Behavior During Therapy: WFL for tasks assessed/performed Overall Cognitive Status: Within Functional Limits for tasks assessed                                 General Comments: Pt was very fatigued and feel asleep at the end of session     General Comments       Exercises     Shoulder Instructions      Home Living Family/patient expects to be discharged to:: Private residence Living Arrangements: Spouse/significant other Available Help at Discharge: Family;Available 24 hours/day Type of Home: House Home Access: Stairs to enter [employer] of Steps: threshold   Home Layout: One level     Bathroom Shower/Tub: Tub/shower unit;Curtain   Firefighter: Standard Bathroom Accessibility: Yes   Home Equipment: Research scientist (life sciences) seat;Grab bars - tub/shower;Hand held Programmer, systems (2 wheels)   Additional Comments: 2L O2 PRN at baseline at home but hus band states she never uses it. They check her O2 and she never needs it.      Prior Functioning/Environment Prior Level of Function : Needs assist             Mobility Comments: RW at baseline ADLs Comments: spouse has been assisting with bathing and dressing for last month or so.        OT Problem List: Decreased strength;Decreased range of motion;Decreased activity tolerance;Impaired balance (sitting and/or standing);Decreased safety awareness;Decreased knowledge of use of  DME or AE;Cardiopulmonary status limiting activity;Pain      OT Treatment/Interventions: Self-care/ADL training;DME and/or AE instruction;Therapeutic activities;Patient/family education;Balance training    OT Goals(Current goals can be found in the care plan section) Acute Rehab OT Goals Patient Stated Goal: To go home OT Goal Formulation: With patient Time For Goal Achievement: 02/24/23 Potential to Achieve Goals: Good  OT Frequency: Min 2X/week    Co-evaluation              AM-PAC OT "6 Clicks" Daily Activity     Outcome Measure Help from another person eating meals?: None Help from another person taking care of personal grooming?: None Help from another person toileting, which includes using toliet, bedpan, or urinal?: A Lot Help from another person bathing (including washing, rinsing, drying)?: A Lot Help from another person to put on and taking off regular upper body clothing?: A Little Help from another person to put on and taking off regular lower body clothing?: A Lot 6 Click Score: 17   End of Session Equipment Utilized During Treatment: Gait belt;Rolling walker (2 wheels) Nurse Communication: Mobility status (pain meds)  Activity Tolerance: Patient limited by fatigue Patient left: in bed;with call bell/phone within reach;with family/visitor present  OT Visit Diagnosis: Unsteadiness on feet (R26.81);Other abnormalities of gait and mobility (R26.89);Muscle weakness (generalized) (M62.81);Pain Pain - Right/Left: Left  Pain - part of body: Leg                Time: 1402-1505 OT Time Calculation (min): 63 min Charges:  OT General Charges $OT Visit: 1 Visit OT Evaluation $OT Eval Low Complexity: 1 Low OT Treatments $Self Care/Home Management : 38-52 mins  Alphia Moh OTR/L  Acute Rehab Services  [phone] office number    Alphia Moh 02/10/2023, 3:17 PM

## 2023-02-10 NOTE — Progress Notes (Addendum)
  Progress Note    02/10/2023 8:16 AM 3 Days Post-Op  Subjective:  no complaints   Vitals:   02/10/23 0315 02/10/23 0736  BP: 136/76 127/71  Pulse: 98 (!) 113  Resp: 16 17  Temp: 98 F (36.7 C) 97.6 F (36.4 C)  SpO2: 95% 97%   Physical Exam: Lungs:  non labored Incisions:  L groin vac with good seal; L TMA with large dry eschar, no purulence or erythema Extremities:  feet warm Neurologic: A&O  CBC    Component Value Date/Time   WBC 5.3 02/08/2023 0202   RBC 3.27 (L) 02/08/2023 0202   HGB 9.6 (L) 02/08/2023 0202   HGB 15.5 09/01/2020 1510   HCT 29.8 (L) 02/08/2023 0202   HCT 45.1 09/01/2020 1510   PLT 543 (H) 02/08/2023 0202   PLT 370 09/01/2020 1510   MCV 91.1 02/08/2023 0202   MCV 94 09/01/2020 1510   MCH 29.4 02/08/2023 0202   MCHC 32.2 02/08/2023 0202   RDW 17.5 (H) 02/08/2023 0202   RDW 12.8 09/01/2020 1510   LYMPHSABS 1.3 12/13/2022 1755   LYMPHSABS 1.8 07/02/2019 1539   MONOABS 1.6 (H) 12/13/2022 1755   EOSABS 0.1 12/13/2022 1755   EOSABS 0.1 07/02/2019 1539   BASOSABS 0.1 12/13/2022 1755   BASOSABS 0.1 07/02/2019 1539    BMET    Component Value Date/Time   NA 135 02/08/2023 0202   NA 141 09/01/2020 1510   K 3.8 02/08/2023 0202   CL 101 02/08/2023 0202   CO2 25 02/08/2023 0202   GLUCOSE 136 (H) 02/08/2023 0202   BUN 9 02/08/2023 0202   BUN 7 (L) 09/01/2020 1510   CREATININE 0.65 02/08/2023 0202   CREATININE 0.64 08/12/2021 1446   CALCIUM 7.9 (L) 02/08/2023 0202   GFRNONAA >60 02/08/2023 0202   GFRNONAA 90 09/28/2017 1201   GFRAA 115 09/01/2020 1510   GFRAA 104 09/28/2017 1201    INR    Component Value Date/Time   INR 1.0 01/11/2023 0933     Intake/Output Summary (Last 24 hours) at 02/10/2023 0816 Last data filed at 02/10/2023 8299 Gross per 24 hour  Intake 120 ml  Output 1120 ml  Net -1000 ml     Assessment/Plan:  67 y.o. female is s/p L groin debridement and vac placement 3 Days Post-Op   Vac changed yesterday.  Next  change on Monday.  TOC working on [facility] RN.  OOB with therapy.  L TMA stable, no sign of infection   Emilie Rutter, PA-C [facility] [phone] 02/10/2023 8:16 AM   I have interviewed and examined patient with PA and agree with assessment and plan above.   Ishmail Mcmanamon C. Randie Heinz, MD [facility] [place] Office: [phone] Pager: [phone]

## 2023-02-10 NOTE — Plan of Care (Signed)
  Problem: Activity: Goal: Ability to tolerate increased activity will improve Outcome: Progressing   Problem: Fluid Volume: Goal: Ability to maintain a balanced intake and output will improve Outcome: Progressing   Problem: Metabolic: Goal: Ability to maintain appropriate glucose levels will improve Outcome: Progressing   Problem: Nutritional: Goal: Maintenance of adequate nutrition will improve Outcome: Progressing   Problem: Education: Goal: Knowledge of General Education information will improve Description: Including pain rating scale, medication(s)/side effects and non-pharmacologic comfort measures Outcome: Progressing

## 2023-02-10 NOTE — Progress Notes (Signed)
   02/10/23 1136  Assess: MEWS Score  Temp 98 F (36.7 C)  BP 96/65  MAP (mmHg) 74  Pulse Rate (!) 110  ECG Heart Rate (!) 112  Resp 19  SpO2 92 %  O2 Device Nasal Cannula  Assess: MEWS Score  MEWS Temp 0  MEWS Systolic 1  MEWS Pulse 2  MEWS RR 0  MEWS LOC 0  MEWS Score 3  MEWS Score Color Yellow  Assess: if the MEWS score is Yellow or Red  Were vital signs taken at a resting state? No  Focused Assessment No change from prior assessment  Does the patient meet 2 or more of the SIRS criteria? No  MEWS guidelines implemented  Yes, yellow  Treat  MEWS Interventions Considered administering scheduled or prn medications/treatments as ordered  Take Vital Signs  Increase Vital Sign Frequency  Yellow: Q2hr x1, continue Q4hrs until patient remains green for 12hrs  Escalate  MEWS: Escalate Yellow: Discuss with charge nurse and consider notifying provider and/or RRT  Notify: Charge Nurse/RN  Name of Charge Nurse/RN Notified Jen  Assess: SIRS CRITERIA  SIRS Temperature  0  SIRS Pulse 1  SIRS Respirations  0  SIRS WBC 0  SIRS Score Sum  1

## 2023-02-10 NOTE — Progress Notes (Signed)
   02/10/23 0736  Assess: MEWS Score  Temp 97.6 F (36.4 C)  BP 127/71  MAP (mmHg) 87  Pulse Rate (!) 113  ECG Heart Rate (!) 113  Resp 17  SpO2 97 %  O2 Device Room Air  Assess: MEWS Score  MEWS Temp 0  MEWS Systolic 0  MEWS Pulse 2  MEWS RR 0  MEWS LOC 0  MEWS Score 2  MEWS Score Color Yellow  Assess: if the MEWS score is Yellow or Red  Were vital signs taken at a resting state? No  Focused Assessment No change from prior assessment  Does the patient meet 2 or more of the SIRS criteria? No  MEWS guidelines implemented  No, vital signs rechecked  Assess: SIRS CRITERIA  SIRS Temperature  0  SIRS Pulse 1  SIRS Respirations  0  SIRS WBC 0  SIRS Score Sum  1

## 2023-02-11 LAB — GLUCOSE, CAPILLARY
Glucose-Capillary: 103 mg/dL — ABNORMAL HIGH (ref 70–99)
Glucose-Capillary: 125 mg/dL — ABNORMAL HIGH (ref 70–99)
Glucose-Capillary: 104 mg/dL — ABNORMAL HIGH (ref 70–99)
Glucose-Capillary: 85 mg/dL (ref 70–99)

## 2023-02-11 LAB — AEROBIC/ANAEROBIC CULTURE W GRAM STAIN (SURGICAL/DEEP WOUND)

## 2023-02-11 MED ORDER — CEFADROXIL 500 MG PO CAPS
500.0000 mg | ORAL_CAPSULE | Freq: Two times a day (BID) | ORAL | Status: DC
  Administered 2023-02-11 – 2023-02-12 (×3): 500 mg via ORAL
  Filled 2023-02-11 (×4): qty 1

## 2023-02-11 NOTE — Plan of Care (Signed)
  Problem: Pain Managment: Goal: General experience of comfort will improve Outcome: Progressing   Problem: Safety: Goal: Ability to remain free from injury will improve Outcome: Progressing   Problem: Skin Integrity: Goal: Risk for impaired skin integrity will decrease Outcome: Progressing   

## 2023-02-11 NOTE — Progress Notes (Addendum)
  Progress Note    02/11/2023 8:43 AM 4 Days Post-Op  Subjective: No complaints   Vitals:   02/11/23 0529 02/11/23 0838  BP: (!) 146/66   Pulse:    Resp: 19   Temp: 98.1 F (36.7 C)   SpO2: 90% 97%   Physical Exam: Lungs: Nonlabored Incisions: Left TMA with dry eschar; left groin with VAC in place good seal Extremities: Feet symmetrically warm Neurologic: A&O  CBC    Component Value Date/Time   WBC 5.3 02/08/2023 0202   RBC 3.27 (L) 02/08/2023 0202   HGB 9.6 (L) 02/08/2023 0202   HGB 15.5 09/01/2020 1510   HCT 29.8 (L) 02/08/2023 0202   HCT 45.1 09/01/2020 1510   PLT 543 (H) 02/08/2023 0202   PLT 370 09/01/2020 1510   MCV 91.1 02/08/2023 0202   MCV 94 09/01/2020 1510   MCH 29.4 02/08/2023 0202   MCHC 32.2 02/08/2023 0202   RDW 17.5 (H) 02/08/2023 0202   RDW 12.8 09/01/2020 1510   LYMPHSABS 1.3 12/13/2022 1755   LYMPHSABS 1.8 07/02/2019 1539   MONOABS 1.6 (H) 12/13/2022 1755   EOSABS 0.1 12/13/2022 1755   EOSABS 0.1 07/02/2019 1539   BASOSABS 0.1 12/13/2022 1755   BASOSABS 0.1 07/02/2019 1539    BMET    Component Value Date/Time   NA 135 02/08/2023 0202   NA 141 09/01/2020 1510   K 3.8 02/08/2023 0202   CL 101 02/08/2023 0202   CO2 25 02/08/2023 0202   GLUCOSE 136 (H) 02/08/2023 0202   BUN 9 02/08/2023 0202   BUN 7 (L) 09/01/2020 1510   CREATININE 0.65 02/08/2023 0202   CREATININE 0.64 08/12/2021 1446   CALCIUM 7.9 (L) 02/08/2023 0202   GFRNONAA >60 02/08/2023 0202   GFRNONAA 90 09/28/2017 1201   GFRAA 115 09/01/2020 1510   GFRAA 104 09/28/2017 1201    INR    Component Value Date/Time   INR 1.0 01/11/2023 0933     Intake/Output Summary (Last 24 hours) at 02/11/2023 0843 Last data filed at 02/11/2023 0500 Gross per 24 hour  Intake 720 ml  Output 800 ml  Net -80 ml     Assessment/Plan:  67 y.o. female is s/p left groin debridement and VAC placement 4 Days Post-Op   Next VAC change tomorrow.  Supplies ordered to the bedside.  RN made  aware.  Out of bed with [employer] and therapy teams.  Hopefully home early this coming week after home health RN arranged for VAC changes.   Emilie Rutter, PA-C [facility] [phone] 02/11/2023 8:43 AM   I have independently interviewed and examined patient and agree with PA assessment and plan above.   Paloma Grange C. Randie Heinz, MD [facility] [place] Office: [phone] Pager: [phone]

## 2023-02-12 LAB — GLUCOSE, CAPILLARY
Glucose-Capillary: 74 mg/dL (ref 70–99)
Glucose-Capillary: 92 mg/dL (ref 70–99)

## 2023-02-12 LAB — AEROBIC/ANAEROBIC CULTURE W GRAM STAIN (SURGICAL/DEEP WOUND)

## 2023-02-12 MED ORDER — SULFAMETHOXAZOLE-TRIMETHOPRIM 800-160 MG PO TABS: 1.0000 | ORAL_TABLET | Freq: Two times a day (BID) | ORAL | 0 refills | Status: DC

## 2023-02-12 MED ORDER — OXYCODONE-ACETAMINOPHEN 5-325 MG PO TABS: 1.0000 | ORAL_TABLET | Freq: Four times a day (QID) | ORAL | 0 refills | Status: DC | PRN

## 2023-02-12 NOTE — TOC Transition Note (Signed)
Transition of Care (TOC) - CM/SW Discharge Note Donn Pierini RN, BSN Transitions of Care Unit 4E- RN Case Manager See Treatment Team for direct phone #   Patient Details  Name: Tammy Mcpherson MRN: [id number] Date of Birth: 12-29-1955  Transition of Care [facility]) CM/SW Contact:  Darrold Span, RN Phone Number: 02/12/2023, 3:25 PM   Clinical Narrative:    Pt stable for transition home today, Spouse at bedside and will transport home.   CM has received notice this am from [facility] that pt has been approved for home wound VAC- email has been sent with POD and CM delivered home vAC to the bedside- signed POD has been faxed back to [facility] liaison and copy placed in chart.   CM spoke with [facility] liaison this am as well to see if they could service for needed HHRN/PT/OT.  Per Iantha Fallen they do not have the staffing to service at this time- unable to accept referral.  [facility] called- they also do not have the needed staffing in pt's area for [facility] needs [facility] called- referral has been accepted with a start of care for Wed 5/15 to do first wound VAC change at home.   CM spoke with pt and spouse at bedside to update on [facility] arrangements- discussed as well therapy needs- pt was doing outpt PT/OT- spouse voiced that he has already called outpt to let them know to put services on hold for now- pt agreeable to have [facility] do PT/OT w/ HH for now then when able will transition back to outpt.   Attending provider has updated HH orders to add PT/OT.   Bedside RN to change pt over to home [facility] and educate on use w/ spouse.   No further TOC needs noted.    Final next level of care: Home w Home Health Services Barriers to Discharge: Barriers Resolved   Patient Goals and CMS Choice CMS Medicare.gov Compare Post Acute Care list provided to:: Patient Choice offered to / list presented to : Patient, Spouse  Discharge Placement                 Home w/ [facility]        Discharge Plan and  Services Additional resources added to the After Visit Summary for     Discharge Planning Services: CM Consult Post Acute Care Choice: Durable Medical Equipment, Home Health          DME Arranged: Vac DME Agency: [facility] Date DME Agency Contacted: 02/07/23 Time DME Agency Contacted: 1500 Representative spoke with at DME Agency: [place] HH Arranged: RN, PT, OT [facility] Agency: [facility] Date [facility] Agency Contacted: 02/12/23 Time HH Agency Contacted: 1400 Representative spoke with at [facility] Agency: Kandee Keen  Social Determinants of Health (SDOH) Interventions SDOH Screenings   Food Insecurity: No Food Insecurity (01/22/2023)  Housing: Low Risk  (01/22/2023)  Transportation Needs: No Transportation Needs (01/22/2023)  Utilities: Not At Risk (01/22/2023)  Alcohol Screen: Low Risk  (10/27/2019)  Depression (PHQ2-9): Low Risk  (01/23/2023)  Recent Concern: Depression (PHQ2-9) - Medium Risk (12/13/2022)  Financial Resource Strain: Low Risk  (01/22/2023)  Physical Activity: Patient Declined (01/22/2023)  Social Connections: Moderately Isolated (01/22/2023)  Stress: No Stress Concern Present (01/22/2023)  Tobacco Use: High Risk (02/08/2023)     Readmission Risk Interventions    02/12/2023    3:25 PM 12/19/2022   11:39 AM  Readmission Risk Prevention Plan  Transportation Screening Complete Complete  PCP or Specialist Appt within 3-5 Days Complete Complete  HRI or Home Care Consult Complete Complete  Social Work Consult for Recovery Care Planning/Counseling Complete Complete  Palliative Care Screening Not Applicable Not Applicable  Medication Review Oceanographer) Complete Complete

## 2023-02-12 NOTE — Progress Notes (Signed)
Physical Therapy Treatment Patient Details Name: Tammy Mcpherson MRN: [id number] DOB: 11-Jan-1956 Today's Date: 02/12/2023   History of Present Illness 67 yo female admitted 5/8 with left groin incision dehiscence s/p washout 5/8 with VAC placement. PMhx:  01/11/23 Lt CFA endarterectomy and Lt transmet amp. HTN, T2Dm, COPD, GIB, PAD    PT Comments    Pt received in sidelying at EOB, agreeable to limited therapy session at bedside for instruction on BLE HEP handout and safety/fall risk prevention. Pt/spouse given gait belt and instructed on its use as well as benefits of gradually increased mobility OOB, monitoring of VS with pt personal pulse ox, and supine/seated/standing BLE exercises with guarding for all standing tasks for safety. Handout given to reinforce therex, pt defers to attempt standing/gait due to fatigue after recently getting up to toilet. Pt continues to benefit from PT services to progress toward functional mobility goals.   Recommendations for follow up therapy are one component of a multi-disciplinary discharge planning process, led by the attending physician.  Recommendations may be updated based on patient status, additional functional criteria and insurance authorization.  Follow Up Recommendations       Assistance Recommended at Discharge Intermittent Supervision/Assistance  Patient can return home with the following A little help with walking and/or transfers;A little help with bathing/dressing/bathroom;Assistance with cooking/housework;Assist for transportation   Equipment Recommendations  None recommended by PT    Recommendations for Other Services       Precautions / Restrictions Precautions Precautions: Fall Precaution Comments: watch HR, wound vac, left transmet 01/2023 Required Braces or Orthoses:  (Pt reported they are WB through heel of LLE with darco shoe) Restrictions Other Position/Activity Restrictions: pt states WB through heel only with darco shoe  (shoe in room, pt defers standing trial during session)     Mobility  Bed Mobility Overal bed mobility: Needs Assistance Bed Mobility: Sidelying to Sit   Sidelying to sit: Min guard, HOB elevated       General bed mobility comments: Pt received sidelying at EOB with feet on floor. HOB 15 degrees with pt able to use bed rail to assist to elevate trunk and PTA placed pillow under LUE/elbow for better support/comfort. Pt defers return to supine or to chair, wanting to remain sitting EOB.    Transfers                   General transfer comment: pt/sig other refusing, pt fatigued; PTA reviewed safe transfer technique for pt/spouse when she does feel up to getting OOB later and gait belt given for pt/spouse safety.    Ambulation/Gait                   Stairs             Wheelchair Mobility    Modified Rankin (Stroke Patients Only)       Balance Overall balance assessment: Needs assistance Sitting-balance support: Bilateral upper extremity supported Sitting balance-Leahy Scale: Fair         Standing balance comment: pt defers but reports she uses RW for support generally                            Cognition Arousal/Alertness: Awake/alert Behavior During Therapy: WFL for tasks assessed/performed Overall Cognitive Status: Within Functional Limits for tasks assessed  General Comments: Significant other present and supports pt in deferring OOB/standing trials due to pt fatigue as plan for DC today. Pt reports she recently transferred from [facility] back to EOB and is in too much pain/fatigue to attempt.        Exercises Other Exercises Other Exercises: Pt given HEP handout including seated LE exercises: ankle pumps, LAQ, knee flexion as well as supine LE: quad sets, hip abd (within pain tolerance), SLR (within pain tolerance, can try to less painful leg to start), heel slides as able, recommend x10  reps TID as well as chair push-ups, pt/spouse receptive and also reviewed a couple standing exercises she can try with gait belt on and spouse standing by to guard if able: mini squats, hip flexion as pain/stability allows Other Exercises: reviewed safe progression of mobility within tolerance, monitoring of VS (pt has pulse ox) especially HR and when to rest for safety (HR >120, lightheadedness, etc).    General Comments General comments (skin integrity, edema, etc.): Reviewed safety with mobility, use of RW, heel WB with darco shoe recommendations, pt receptive but defers OOB mobility due to fatigue and impending DC.      Pertinent Vitals/Pain Pain Assessment Pain Assessment: Faces Faces Pain Scale: Hurts even more Pain Location: left groin Pain Descriptors / Indicators: Aching, Sore Pain Intervention(s): Monitored during session, Limited activity within patient's tolerance, Other (comment), Patient requesting pain meds-RN notified (pt defers ice pack or OOB transfers, pt repositioned for comfort while sitting EOB with pillow to support LUE (pt propping on L elbow for prolonged period of time))    Home Living                          Prior Function            PT Goals (current goals can now be found in the care plan section) Acute Rehab PT Goals Patient Stated Goal: return home PT Goal Formulation: With patient/family Time For Goal Achievement: 02/23/23 Progress towards PT goals: Progressing toward goals    Frequency    Min 1X/week      PT Plan Current plan remains appropriate    Co-evaluation              AM-PAC PT "6 Clicks" Mobility   Outcome Measure  Help needed turning from your back to your side while in a flat bed without using bedrails?: A Little Help needed moving from lying on your back to sitting on the side of a flat bed without using bedrails?: A Little Help needed moving to and from a bed to a chair (including a wheelchair)?: A Little  (this and items below estimated per pt/nursing report from recent mobility, pt defers to attempt during session) Help needed standing up from a chair using your arms (e.g., wheelchair or bedside chair)?: A Little Help needed to walk in hospital room?: A Little Help needed climbing 3-5 steps with a railing? : Total 6 Click Score: 16    End of Session Equipment Utilized During Treatment: Gait belt (pt given gait belt for home) Activity Tolerance: Patient limited by fatigue;Patient limited by pain Patient left: in bed;with call bell/phone within reach;with family/visitor present (pt sitting EOB, spouse in room) Nurse Communication: Mobility status;Patient requests pain meds PT Visit Diagnosis: Other abnormalities of gait and mobility (R26.89);Difficulty in walking, not elsewhere classified (R26.2)     Time: 8119-1478 PT Time Calculation (min) (ACUTE ONLY): 20 min  Charges:  $Therapeutic Activity:  8-22 mins                     Florina Ou., PTA Acute Rehabilitation Services Secure Chat Preferred 9a-5:30pm Office: [phone]    Dorathy Kinsman [facility] 02/12/2023, 2:53 PM

## 2023-02-12 NOTE — Progress Notes (Addendum)
  Progress Note    02/12/2023 8:06 AM 5 Days Post-Op  Subjective:  no complaints   Vitals:   02/11/23 2356 02/12/23 0330  BP: (!) 144/73   Pulse:  (!) 101  Resp: 17   Temp: 97.8 F (36.6 C) 97.9 F (36.6 C)  SpO2: 96% 93%   Physical Exam: Lungs:  non labored Incisions:  L groin wound bed granulating; no purulence, no exposed vasculature Extremities:  L TMA dry eschar Neurologic: A&O    CBC    Component Value Date/Time   WBC 5.3 02/08/2023 0202   RBC 3.27 (L) 02/08/2023 0202   HGB 9.6 (L) 02/08/2023 0202   HGB 15.5 09/01/2020 1510   HCT 29.8 (L) 02/08/2023 0202   HCT 45.1 09/01/2020 1510   PLT 543 (H) 02/08/2023 0202   PLT 370 09/01/2020 1510   MCV 91.1 02/08/2023 0202   MCV 94 09/01/2020 1510   MCH 29.4 02/08/2023 0202   MCHC 32.2 02/08/2023 0202   RDW 17.5 (H) 02/08/2023 0202   RDW 12.8 09/01/2020 1510   LYMPHSABS 1.3 12/13/2022 1755   LYMPHSABS 1.8 07/02/2019 1539   MONOABS 1.6 (H) 12/13/2022 1755   EOSABS 0.1 12/13/2022 1755   EOSABS 0.1 07/02/2019 1539   BASOSABS 0.1 12/13/2022 1755   BASOSABS 0.1 07/02/2019 1539    BMET    Component Value Date/Time   NA 135 02/08/2023 0202   NA 141 09/01/2020 1510   K 3.8 02/08/2023 0202   CL 101 02/08/2023 0202   CO2 25 02/08/2023 0202   GLUCOSE 136 (H) 02/08/2023 0202   BUN 9 02/08/2023 0202   BUN 7 (L) 09/01/2020 1510   CREATININE 0.65 02/08/2023 0202   CREATININE 0.64 08/12/2021 1446   CALCIUM 7.9 (L) 02/08/2023 0202   GFRNONAA >60 02/08/2023 0202   GFRNONAA 90 09/28/2017 1201   GFRAA 115 09/01/2020 1510   GFRAA 104 09/28/2017 1201    INR    Component Value Date/Time   INR 1.0 01/11/2023 0933     Intake/Output Summary (Last 24 hours) at 02/12/2023 0806 Last data filed at 02/11/2023 2021 Gross per 24 hour  Intake 720 ml  Output 650 ml  Net 70 ml     Assessment/Plan:  67 y.o. female is s/p  L groin debridement and vac placement  5 Days Post-Op   Vac changed at the bedside today.  Wound  bed is granulating.  No exposed vasculature.  Seems to be improving.  Ok for discharge when [facility] RN arranged.  OOB with therapy.   Emilie Rutter, PA-C [facility] [phone] 02/12/2023 8:06 AM  VASCULAR STAFF ADDENDUM: I have independently interviewed and examined the patient. I agree with the above.  Home today. Plan oral antibiotics x 7 days. Follow up with me or PA in 1-2 weeks for wound check.  Rande Brunt. Lenell Antu, MD [facility] Phone Number: [phone] 02/12/2023 9:37 AM

## 2023-02-12 NOTE — Plan of Care (Signed)
DISCHARGE NOTE HOME VARENYA VANHOESEN to be discharged home per MD order. Discussed prescriptions and follow up appointments with the patient. Medication list explained in detail. Patient verbalized understanding.  IV catheter discontinued intact. Site without signs and symptoms of complications. Dressing and pressure applied. Pt denies pain at the site currently. No complaints noted.  An After Visit Summary (AVS) was printed and given to the patient. Patient escorted via wheelchair, and discharged home via private auto.  Arlice Colt, RN

## 2023-02-13 ENCOUNTER — Telehealth: Payer: Self-pay

## 2023-02-13 NOTE — Transitions of Care (Post Inpatient/ED Visit) (Unsigned)
   02/13/2023  Name: Tammy Mcpherson MRN: [id number] DOB: 1956-08-03  Today's TOC FU Call Status: Today's TOC FU Call Status:: Unsuccessul Call (1st Attempt) Unsuccessful Call (1st Attempt) Date: 02/13/23  Attempted to reach the patient regarding the most recent Inpatient/ED visit.  Follow Up Plan: Additional outreach attempts will be made to reach the patient to complete the Transitions of Care (Post Inpatient/ED visit) call.   Signature  Agnes Lawrence, CMA (AAMA)  [facility]- [facility] [phone]

## 2023-02-13 NOTE — Discharge Summary (Signed)
Discharge Summary  [name] [id number] 67 y.o. Sep 12, 1956  Admit date: 02/07/2023  Discharge date and time: 02/12/2023  4:04 PM   Admitting Physician: [name], MD   Discharge Physician: Same  Admission Diagnoses: Non-healing surgical wound [T81.89XA]  Discharge Diagnoses: Same  Admission Condition: fair  Discharged Condition: good  Indication for Admission: Postoperative wound care  Hospital Course: [name] is a 67 year old female who underwent left common femoral endarterectomy with saphenous vein patch angioplasty with TMA with Dr. Lenell Antu on 01/11/2023.  Unfortunately she developed wound dehiscence of the left groin requiring return to the operating room.  She underwent left groin debridement with wound VAC placement by Dr. Chestine Spore on 02/07/2023.  She tolerated the procedure well and was admitted to the hospital postoperatively.  Much of her hospital stay consisted of wound care with wound VAC changes 3 days a week.  Over the course of her hospital stay her left groin wound has improved significantly.  The wound appears to be much more shallow and is granulating.  There is no exposure of the vasculature.  TOC was consulted for arranging home health for wound VAC changes with an RN as well as PT/OT.  She will return to office in 2 weeks for wound VAC change.  She will continue to cleanse her left TMA with soap and water and to pat dry.  Home health RN has been arranged for 2-3 times per week VAC changes.  She was prescribed 2-3 more days of narcotic pain medication.  She was discharged home in stable condition.  It should also be noted that she was prescribed 7 days of Bactrim.  Consults: None  Treatments: surgery: Left groin surgical wound debridement with wound VAC placement  Discharge Exam: See progress note 02/12/23 Vitals:   02/12/23 0330 02/12/23 0813  BP:  (!) 164/73  Pulse: (!) 101 (!) 108  Resp:  16  Temp: 97.9 F (36.6 C) 97.9 F (36.6 C)   SpO2: 93% 92%     Disposition: Discharge disposition: 01-Home or Self Care       Patient Instructions:  Allergies as of 02/12/2023       Reactions   Bee Venom Anaphylaxis, Swelling   Codeine Anaphylaxis, Other (See Comments)   Tongue swelled   Penicillins Anaphylaxis, Swelling, Rash   Tolerated Cefdinir Jan 2024  Has patient had a PCN reaction causing immediate rash, facial/tongue/throat swelling, SOB or lightheadedness with hypotension: No, delayed Has patient had a PCN reaction causing severe rash involving mucus membranes or skin necrosis: No Has patient had a PCN reaction that required hospitalization No Has patient had a PCN reaction occurring within the last 10 years: No If all of the above answers are "NO", then may proceed with Cephalosporin use.   Bupropion Other (See Comments)   "Made my skin crawl"   Sudafed [pseudoephedrine Hcl] Rash        Medication List     TAKE these medications    Acetaminophen 500 MG capsule Take 500-1,000 mg by mouth every 6 (six) hours as needed for pain (or headaches).   albuterol (2.5 MG/3ML) 0.083% nebulizer solution Commonly known as: PROVENTIL Take 3 mLs (2.5 mg total) by nebulization every 4 (four) hours as needed for wheezing or shortness of breath.   albuterol 108 (90 Base) MCG/ACT inhaler Commonly known as: VENTOLIN HFA INHALE TWO PUFFS INTO THE LUNGS EVERY SIX HOURS AS NEEDED FOR WHEEZING OR SHORTNESS OF BREATH   ALPRAZolam 0.5 MG tablet  Commonly known as: XANAX TAKE ONE TABLET (0.5MG  TOTAL) BY MOUTH DAILY AS NEEDED FOR ANXIETY   aspirin 81 MG chewable tablet Chew 1 tablet (81 mg total) by mouth every morning.   atorvastatin 80 MG tablet Commonly known as: LIPITOR Take 1 tablet (80 mg total) by mouth daily. What changed: when to take this   blood glucose meter kit and supplies Dispense based on patient and insurance preference. Use up to four times daily as directed. (FOR ICD-10 E10.9, E11.9).   clopidogrel  75 MG tablet Commonly known as: PLAVIX Take 1 tablet (75 mg total) by mouth daily. TAKE ONE TABLET (75MG  TOTAL) BY MOUTH DAILY WITH BREAKFAST   cyanocobalamin 1000 MCG/ML injection Commonly known as: VITAMIN B12 INJECT 1 ML INTO THE MUSCLE EVERY 30 DAYS. What changed: See the new instructions.   cyclobenzaprine 5 MG tablet Commonly known as: FLEXERIL TAKE ONE TABLET (5MG  TOTAL) BY MOUTH THREE TIMES DAILY AS NEEDED FOR MUSCLE SPASMS   dicyclomine 10 MG capsule Commonly known as: BENTYL Take 1 capsule (10 mg total) by mouth 2 (two) times daily as needed for spasms.   Entresto 49-51 MG Generic drug: sacubitril-valsartan Take 1 tablet by mouth 2 (two) times daily.   FLUoxetine 40 MG capsule Commonly known as: PROZAC Take 1 capsule (40 mg total) by mouth daily. What changed: when to take this   FreeStyle Libre 2 Reader [facility] Check blood glucose up to 4 times daily   FreeStyle Libre 2 Sensor Misc Apply one sensor to the upper arm avery 14 days. What changed:  how much to take how to take this when to take this additional instructions   gabapentin 300 MG capsule Commonly known as: NEURONTIN Take 1 capsule (300 mg total) by mouth 3 (three) times daily.   glucose blood test strip Commonly known as: OneTouch Ultra Use as instructed   glucose monitoring kit monitoring kit Use to check blood sugar BID as directed   metFORMIN 500 MG 24 hr tablet Commonly known as: GLUCOPHAGE-XR Take 1 tablet (500 mg total) by mouth daily with breakfast. What changed:  when to take this reasons to take this   Misc. Devices Misc Please provide supplies (needle, syringe, alcohol swabs) needed for patient to self-administer B-12 injections monthly.   OneTouch Delica Plus Lancet33G Misc 1 each by Does not apply route 2 (two) times a day.   oxyCODONE-acetaminophen 5-325 MG tablet Commonly known as: PERCOCET/ROXICET Take 1 tablet by mouth every 6 (six) hours as needed for moderate pain.    pantoprazole 40 MG tablet Commonly known as: PROTONIX TAKE ONE (1) TABLET 30 MINS BEFORE YOUR FIRST MEAL.   sulfamethoxazole-trimethoprim 800-160 MG tablet Commonly known as: BACTRIM DS Take 1 tablet by mouth 2 (two) times daily.   UltiCare Tuberculin Safety Syr 25G X 1" 1 ML Misc Generic drug: Tuberculin-Allergy Syringes USE TO ADMINISTER INJECTABLE VITAMIN B12.   Vitamin D 50 MCG (2000 UT) Caps Take 1 capsule (2,000 Units total) by mouth daily.       Activity: activity as tolerated Diet: regular diet Wound Care:  Wound VAC to left groin to be changed 2-3 times a week; continue to keep left TMA dry and can cleanse with soap and water daily  Follow-up with [facility] in 2 weeks.  Signed: Emilie Rutter, PA-C 02/13/2023 8:20 AM [facility] Office: [phone]

## 2023-02-14 ENCOUNTER — Telehealth: Payer: Self-pay

## 2023-02-14 NOTE — Transitions of Care (Post Inpatient/ED Visit) (Signed)
02/14/2023  Name: Tammy Mcpherson MRN: [id number] DOB: 1956/08/30  Today's TOC FU Call Status: Today's TOC FU Call Status:: Successful TOC FU Call Competed TOC FU Call Complete Date: 02/14/23  Transition Care Management Follow-up Telephone Call Date of Discharge: 02/12/23 Discharge Facility: [facility]) Type of Discharge: Inpatient Admission Primary Inpatient Discharge Diagnosis:: "non-healing surgical wound" How have you been since you were released from the hospital?: Better (Spoke w/ spouse-states pt sleep at present but has been doing well since coming home. Denies any acute issues or concerns. States pt eating about 2x/day. Pain is controlled with regimen. She is taking abx and no issues with wound VAC.) Any questions or concerns?: No  Items Reviewed: Did you receive and understand the discharge instructions provided?: Yes Any new allergies since your discharge?: No Dietary orders reviewed?: Yes Type of Diet Ordered:: low salt/heart healthy/carb modified Do you have support at home?: Yes People in Home: spouse Name of Support/Comfort Primary Source: [name]  Medications Reviewed Today: Medications Reviewed Today     Reviewed by Charlyn Minerva, RN (Registered Nurse) on 02/14/23 at 1057  Med List Status: <None>   Medication Order Taking? Sig Documenting Provider Last Dose Status Informant  Acetaminophen 500 MG capsule [id number] No Take 500-1,000 mg by mouth every 6 (six) hours as needed for pain (or headaches). [provider] 02/06/2023 Active Spouse/Significant Other  albuterol (PROVENTIL) (2.5 MG/3ML) 0.083% nebulizer solution [id number] No Take 3 mLs (2.5 mg total) by nebulization every 4 (four) hours as needed for wheezing or shortness of breath. Janeece Agee, NP More than a month Active Spouse/Significant Other  albuterol (VENTOLIN HFA) 108 (90 Base) MCG/ACT inhaler [id number] No INHALE TWO PUFFS INTO THE LUNGS EVERY SIX HOURS AS NEEDED FOR WHEEZING OR  SHORTNESS OF Elie Confer, NP Past Month Active Spouse/Significant Other  ALPRAZolam (XANAX) 0.5 MG tablet [id number] No TAKE ONE TABLET (0.5MG  TOTAL) BY MOUTH DAILY AS NEEDED FOR ANXIETY Jarold Motto, PA 02/05/2023 Active Spouse/Significant Other  aspirin 81 MG chewable tablet [id number] No Chew 1 tablet (81 mg total) by mouth every morning. Maurilio Lovely D, DO 02/07/2023 0530 Active Spouse/Significant Other  atorvastatin (LIPITOR) 80 MG tablet [id number] No Take 1 tablet (80 mg total) by mouth daily.  Patient taking differently: Take 80 mg by mouth at bedtime.   Jonelle Sidle, MD 02/06/2023 Active Spouse/Significant Other  blood glucose meter kit and supplies [id number] No Dispense based on patient and insurance preference. Use up to four times daily as directed. (FOR ICD-10 E10.9, E11.9). Janeece Agee, NP Taking Active Spouse/Significant Other  Cholecalciferol (VITAMIN D) 50 MCG (2000 UT) CAPS [id number] No Take 1 capsule (2,000 Units total) by mouth daily. Doreatha Massed, MD 02/06/2023 Active Spouse/Significant Other  clopidogrel (PLAVIX) 75 MG tablet [id number] No Take 1 tablet (75 mg total) by mouth daily. TAKE ONE TABLET (75MG  TOTAL) BY MOUTH DAILY WITH BREAKFAST Maurilio Lovely D, DO 02/07/2023 0530 Active Spouse/Significant Other           Med Note Halford Decamp   Tue Feb 06, 2023  2:17 PM)    Continuous Blood Gluc Receiver (FREESTYLE LIBRE 2 READER) DEVI [id number] No Check blood glucose up to 4 times daily [place], [place], [place] Taking Active Spouse/Significant Other  Continuous Blood Gluc Sensor (FREESTYLE LIBRE 2 SENSOR) MISC [id number] No Apply one sensor to the upper arm avery 14 days.  Patient taking differently: Inject 1 patch into the skin See admin instructions. Apply one sensor into the upper arm every  14 days   Janeece Agee, NP Taking Active Spouse/Significant Other           Med Note Antony Madura, Arn Medal   Fri Dec 08, 2022  6:49 PM) The patient does not have a sensor in at  this time  cyanocobalamin (VITAMIN B12) 1000 MCG/ML injection [id number]  INJECT 1 ML INTO THE MUSCLE EVERY 30 DAYS. Doreatha Massed, MD  Active   cyclobenzaprine (FLEXERIL) 5 MG tablet [id number] No TAKE ONE TABLET (5MG  TOTAL) BY MOUTH THREE TIMES DAILY AS NEEDED FOR MUSCLE SPASMS Jarold Motto, [place] 02/05/2023 Active Spouse/Significant Other  dicyclomine (BENTYL) 10 MG capsule [id number] No Take 1 capsule (10 mg total) by mouth 2 (two) times daily as needed for spasms. Aida Raider, NP 02/04/2023 Active Spouse/Significant Other  FLUoxetine (PROZAC) 40 MG capsule [id number] No Take 1 capsule (40 mg total) by mouth daily.  Patient taking differently: Take 40 mg by mouth every evening.   Janeece Agee, NP 02/06/2023 Active Spouse/Significant Other  gabapentin (NEURONTIN) 300 MG capsule [id number] No Take 1 capsule (300 mg total) by mouth 3 (three) times daily. Jarold Motto, [place] 02/07/2023 0530 Active Spouse/Significant Other  glucose blood (ONETOUCH ULTRA) test strip [id number] No Use as instructed Overton Mam, DO Taking Active Spouse/Significant Other  glucose monitoring kit (FREESTYLE) monitoring kit [id number] No Use to check blood sugar BID as directed Overton Mam, DO Taking Active Spouse/Significant Other  Lancets Letta Pate DELICA PLUS [place]) MISC [id number] No 1 each by Does not apply route 2 (two) times a day. Overton Mam, DO Taking Active Spouse/Significant Other  metFORMIN (GLUCOPHAGE-XR) 500 MG 24 hr tablet [id number] No Take 1 tablet (500 mg total) by mouth daily with breakfast.  Patient taking differently: Take 500 mg by mouth daily as needed (high blood sugar).   Ghimire, Werner Lean, MD Past Month Active Spouse/Significant Other  Misc. Devices MISC [id number] No Please provide supplies (needle, syringe, alcohol swabs) needed for patient to self-administer B-12 injections monthly. Doreatha Massed, MD Taking Active Spouse/Significant Other  oxyCODONE-acetaminophen  (PERCOCET/ROXICET) 5-325 MG tablet [id number]  Take 1 tablet by mouth every 6 (six) hours as needed for moderate pain. Emilie Rutter, PA-C  Active   pantoprazole (PROTONIX) 40 MG tablet [id number] No TAKE ONE (1) TABLET 30 MINS BEFORE YOUR FIRST MEAL. Aida Raider, NP 02/07/2023 0530 Active Spouse/Significant Other  sacubitril-valsartan (ENTRESTO) 49-51 MG [id number] No Take 1 tablet by mouth 2 (two) times daily. Jonelle Sidle, MD 02/06/2023 Active Spouse/Significant Other  sulfamethoxazole-trimethoprim (BACTRIM DS) 800-160 MG tablet [id number]  Take 1 tablet by mouth 2 (two) times daily. Emilie Rutter, PA-C  Active   Tuberculin-Allergy Syringes Stann Ore TUBERCULIN SAFETY SYR) 25G X 1" 1 ML MISC [id number]  USE TO ADMINISTER INJECTABLE VITAMIN B12. Doreatha Massed, MD  Active             Home Care and Equipment/Supplies: Were Home Health Services Ordered?: Yes [facility] of [facility] Health Agency:: [facility] Has Agency set up a time to come to your home?: Yes First Home Health Visit Date: 02/14/23 (spouse states HHRN coming today around 3-3:30pm) Any new equipment or medical supplies ordered?: Yes Name of Medical supply agency?: [facility] Were you able to get the equipment/medical supplies?: Yes Do you have any questions related to the use of the equipment/supplies?: No  Functional Questionnaire: Do you need assistance with bathing/showering or dressing?: Yes Do you need assistance with meal preparation?: Yes Do you need assistance with eating?: No Do you have difficulty maintaining continence:  No Do you need assistance with getting out of bed/getting out of a chair/moving?: Yes Do you have difficulty managing or taking your medications?: No  Follow up appointments reviewed: PCP Follow-up appointment confirmed?: No (Spouse declined making PCP appt during call-states he wants to discuss with pt first-will call office later to make an appt) MD Provider Line Number:[phone]  Given: No Specialist Hospital Follow-up appointment confirmed?: Yes Date of Specialist follow-up appointment?: 02/27/23 Follow-Up [facility] Do you need transportation to your follow-up appointment?: No (spouse confirms he is able to take pt to appts) Do you understand care options if your condition(s) worsen?: Yes-patient verbalized understanding  SDOH Interventions Today    Flowsheet Row Most Recent Value  SDOH Interventions   Food Insecurity Interventions Intervention Not Indicated  Transportation Interventions Intervention Not Indicated      TOC Interventions Today    Flowsheet Row Most Recent Value  TOC Interventions   TOC Interventions Discussed/Reviewed TOC Interventions Discussed, Post discharge activity limitations per provider, S/S of infection, Post op wound/incision care  [spouse reports wound VAC is working well and no issues]      Interventions Today    Flowsheet Row Most Recent Value  General Interventions   General Interventions Discussed/Reviewed General Interventions Discussed, Doctor Visits, Durable Medical Equipment (DME), Referral to Nurse  [follow up appt scheduled with RN care coordinator]  Doctor Visits Discussed/Reviewed Doctor Visits Discussed, Specialist, PCP  Durable Medical Equipment (DME) Other  [wound VAC]  PCP/Specialist Visits Compliance with follow-up visit  Education Interventions   Education Provided Provided Education  Provided Verbal Education On Nutrition, When to see the doctor, Medication, Other  Nutrition Interventions   Nutrition Discussed/Reviewed Nutrition Discussed, Adding fruits and vegetables, Decreasing salt, Increaing proteins  Pharmacy Interventions   Pharmacy Dicussed/Reviewed Pharmacy Topics Discussed, Medications and their functions  Safety Interventions   Safety Discussed/Reviewed Safety Discussed       .Antionette Fairy, RN,BSN,CCM [facility] Care Management Care Management  Community Coordinator Direct Phone: [phone] Toll Free: [phone] Fax: [phone]

## 2023-02-14 NOTE — Transitions of Care (Post Inpatient/ED Visit) (Signed)
   02/14/2023  Name: Tammy Mcpherson MRN: [id number] DOB: 01/06/1956  Today's TOC FU Call Status: Today's TOC FU Call Status:: Unsuccessful Call (2nd Attempt) Unsuccessful Call (1st Attempt) Date: 02/13/23 Unsuccessful Call (2nd Attempt) Date: 02/14/23  Attempted to reach the patient regarding the most recent Inpatient/ED visit.  Follow Up Plan: No further outreach attempts will be made at this time. We have been unable to contact the patient.  TOC was already completed today by office staff. Signature Agnes Lawrence, CMA (AAMA)  [facility] [phone]

## 2023-02-19 ENCOUNTER — Encounter: Payer: Self-pay | Admitting: Vascular Surgery

## 2023-02-19 ENCOUNTER — Other Ambulatory Visit: Payer: Self-pay | Admitting: Physician Assistant

## 2023-02-19 ENCOUNTER — Ambulatory Visit: Payer: Self-pay

## 2023-02-19 NOTE — Patient Instructions (Addendum)
Visit Information  Thank you for taking time to visit with me today. Please don't hesitate to contact me if I can be of assistance to you.   Following are the goals we discussed today:   Goals Addressed             This Visit's Progress    Diabetes management and healing of  left groin wound       Care Coordination Interventions: Evaluation of current treatment plan related to left groin wound and patient's adherence to plan as established by provider Provided education to patient re: s/s of infection Reviewed scheduled/upcoming provider appointments including Vascular appointment 02/27/23 Discussed plans with patient for ongoing care management follow up and provided patient with direct contact information for care management team Provided education to patient about basic DM disease process Blood sugars running  Discussed wound vac and care.        Our next appointment is by telephone on 03/12/23 at 1200 pm  Please call the care guide team at [phone] if you need to cancel or reschedule your appointment.   If you are experiencing a Mental Health or Behavioral Health Crisis or need someone to talk to, please call the Suicide and Crisis Lifeline: 988   Patient verbalizes understanding of instructions and care plan provided today and agrees to view in MyChart. Active MyChart status and patient understanding of how to access instructions and care plan via MyChart confirmed with patient.     The patient has been provided with contact information for the care management team and has been advised to call with any health related questions or concerns.   Bary Leriche, RN, MSN [facility] Care Management Care Management Coordinator Direct Line [phone]

## 2023-02-19 NOTE — Patient Outreach (Addendum)
  Care Coordination   Initial Visit Note   02/19/2023 Name: Tammy Mcpherson MRN: [id number] DOB: 10/11/1955  Tammy Mcpherson is a 67 y.o. year old female who sees [place], [place], [place] for primary care. I  spoke with spouse [name] by phone today. Patient doing better and left groin wound healing.  Active with home health for wound vac changes, PT/OT  What matters to the patients health and wellness today?  Healing of left groin wound.    Goals Addressed             This Visit's Progress    Diabetes management and healing of  left groin wound       Care Coordination Interventions: Evaluation of current treatment plan related to left groin wound and patient's adherence to plan as established by provider Provided education to patient re: s/s of infection Reviewed scheduled/upcoming provider appointments including Vascular appointment 02/27/23 Discussed plans with patient for ongoing care management follow up and provided patient with direct contact information for care management team Provided education to patient about basic DM disease process Blood sugars running  Discussed wound vac and care.        SDOH assessments and interventions completed:  Yes  SDOH Interventions Today    Flowsheet Row Most Recent Value  SDOH Interventions   Housing Interventions Intervention Not Indicated  Transportation Interventions Intervention Not Indicated        Care Coordination Interventions:  Yes, provided   Follow up plan: Follow up call scheduled for June 10th    Encounter Outcome:  Pt. Visit Completed   Bary Leriche, RN, MSN [facility] Care Management Care Management Coordinator Direct Line [phone]

## 2023-02-20 ENCOUNTER — Other Ambulatory Visit (INDEPENDENT_AMBULATORY_CARE_PROVIDER_SITE_OTHER): Payer: PPO | Admitting: Vascular Surgery

## 2023-02-20 MED ORDER — OXYCODONE HCL 5 MG PO TABS: 5.0000 mg | ORAL_TABLET | Freq: Three times a day (TID) | ORAL | 0 refills | Status: DC | PRN

## 2023-02-20 NOTE — Progress Notes (Signed)
Refilled Rx for oxycodone for ongoing wound care with wound vac.  Rande Brunt. Lenell Antu, MD [facility] Phone Number: [phone] 02/20/2023 8:35 AM

## 2023-02-21 ENCOUNTER — Ambulatory Visit: Payer: PPO | Admitting: Physician Assistant

## 2023-02-22 ENCOUNTER — Ambulatory Visit (HOSPITAL_COMMUNITY): Payer: PPO

## 2023-02-22 NOTE — Progress Notes (Signed)
POST OPERATIVE OFFICE NOTE    CC:  F/u for surgery  HPI:  This is a 67 y.o. female who is s/p debridement of left groin wound and placement of wound vac on 02/07/2023 by Dr. Chestine Spore. She has hx of left greater saphenous vein harvest, left common femoral endarterectomy with saphenous vein patch angioplasty, and left transmetatarsal amputation on 01/11/2023 by Dr. Lenell Antu.  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  Prior to the surgery she underwent left femoropopliteal angioplasty and stenting on 12/15/2022 by Dr. Lenell Antu.  She also underwent right SFA angioplasty and stenting with DCBA of previous right femoral stent on 12/18/2022 by Dr. Edilia Bo.  She has a history of right popliteal stenting and right anterior tibial angioplasty on 05/19/2021 by Dr. Lenell Antu.  She then had a right great toe and second toe amputation on 06/01/2021 by Dr. Lenell Antu.   Pt returns today for follow up.  Pt states she is doing well.  She still has the wound vac.  She states she feels it is getting better.  She is taking her asa/statin/plavix   Allergies  Allergen Reactions   Bee Venom Anaphylaxis and Swelling   Codeine Anaphylaxis and Other (See Comments)    Tongue swelled   Penicillins Anaphylaxis, Swelling and Rash    Tolerated Cefdinir Jan 2024  Has patient had a PCN reaction causing immediate rash, facial/tongue/throat swelling, SOB or lightheadedness with hypotension: No, delayed Has patient had a PCN reaction causing severe rash involving mucus membranes or skin necrosis: No Has patient had a PCN reaction that required hospitalization No Has patient had a PCN reaction occurring within the last 10 years: No If all of the above answers are "NO", then may proceed with Cephalosporin use.    Bupropion Other (See Comments)    "Made my skin crawl"   Sudafed [Pseudoephedrine Hcl] Rash    Current Outpatient Medications  Medication Sig Dispense Refill    Acetaminophen 500 MG capsule Take 500-1,000 mg by mouth every 6 (six) hours as needed for pain (or headaches).     albuterol (PROVENTIL) (2.5 MG/3ML) 0.083% nebulizer solution Take 3 mLs (2.5 mg total) by nebulization every 4 (four) hours as needed for wheezing or shortness of breath. 75 mL 5   albuterol (VENTOLIN HFA) 108 (90 Base) MCG/ACT inhaler INHALE TWO PUFFS INTO THE LUNGS EVERY SIX HOURS AS NEEDED FOR WHEEZING OR SHORTNESS OF BREATH 8.5 g 5   ALPRAZolam (XANAX) 0.5 MG tablet TAKE ONE TABLET (0.5MG  TOTAL) BY MOUTH DAILY AS NEEDED FOR ANXIETY 30 tablet 2   aspirin 81 MG chewable tablet Chew 1 tablet (81 mg total) by mouth every morning. 30 tablet 0   atorvastatin (LIPITOR) 80 MG tablet Take 1 tablet (80 mg total) by mouth daily. (Patient taking differently: Take 80 mg by mouth at bedtime.) 90 tablet 3   blood glucose meter kit and supplies Dispense based on patient and insurance preference. Use up to four times daily as directed. (FOR ICD-10 E10.9, E11.9). 1 each 0   Cholecalciferol (VITAMIN D) 50 MCG (2000 UT) CAPS Take 1 capsule (2,000 Units total) by mouth daily. 90 capsule 4   clopidogrel (PLAVIX) 75 MG tablet Take 1 tablet (75 mg total) by mouth daily. TAKE ONE TABLET (75MG  TOTAL) BY MOUTH DAILY WITH BREAKFAST 90 tablet 1   Continuous Blood Gluc Receiver (FREESTYLE LIBRE 2 READER) DEVI Check blood glucose up to 4 times daily 1 each 0   Continuous Blood  Gluc Sensor (FREESTYLE LIBRE 2 SENSOR) MISC Apply one sensor to the upper arm avery 14 days. (Patient taking differently: Inject 1 patch into the skin See admin instructions. Apply one sensor into the upper arm every 14 days) 6 each 1   cyanocobalamin (VITAMIN B12) 1000 MCG/ML injection INJECT 1 ML INTO THE MUSCLE EVERY 30 DAYS. 6 mL 0   cyclobenzaprine (FLEXERIL) 5 MG tablet TAKE ONE TABLET (5MG  TOTAL) BY MOUTH THREE TIMES DAILY AS NEEDED FOR MUSCLE SPASMS 45 tablet 1   dicyclomine (BENTYL) 10 MG capsule Take 1 capsule (10 mg total) by mouth 2  (two) times daily as needed for spasms. 45 capsule 1   FLUoxetine (PROZAC) 40 MG capsule Take 1 capsule (40 mg total) by mouth daily. (Patient taking differently: Take 40 mg by mouth every evening.) 90 capsule 3   gabapentin (NEURONTIN) 300 MG capsule Take 1 capsule (300 mg total) by mouth 3 (three) times daily. 270 capsule 0   glucose blood (ONETOUCH ULTRA) test strip Use as instructed 100 each 12   glucose monitoring kit (FREESTYLE) monitoring kit Use to check blood sugar BID as directed 1 each 0   Lancets (ONETOUCH DELICA PLUS LANCET33G) MISC 1 each by Does not apply route 2 (two) times a day. 100 each 3   metFORMIN (GLUCOPHAGE-XR) 500 MG 24 hr tablet Take 1 tablet (500 mg total) by mouth daily with breakfast. (Patient taking differently: Take 500 mg by mouth daily as needed (high blood sugar).) 90 tablet 1   Misc. Devices MISC Please provide supplies (needle, syringe, alcohol swabs) needed for patient to self-administer B-12 injections monthly. 1 each 12   oxyCODONE (OXY IR/ROXICODONE) 5 MG immediate release tablet Take 1 tablet (5 mg total) by mouth every 8 (eight) hours as needed for severe pain. 30 tablet 0   oxyCODONE-acetaminophen (PERCOCET/ROXICET) 5-325 MG tablet Take 1 tablet by mouth every 6 (six) hours as needed for moderate pain. 20 tablet 0   pantoprazole (PROTONIX) 40 MG tablet TAKE ONE (1) TABLET 30 MINS BEFORE YOUR FIRST MEAL. 90 tablet 3   sacubitril-valsartan (ENTRESTO) 49-51 MG Take 1 tablet by mouth 2 (two) times daily. 60 tablet 6   sulfamethoxazole-trimethoprim (BACTRIM DS) 800-160 MG tablet Take 1 tablet by mouth 2 (two) times daily. 14 tablet 0   Tuberculin-Allergy Syringes ([employer] TUBERCULIN SAFETY SYR) 25G X 1" 1 ML MISC USE TO ADMINISTER INJECTABLE VITAMIN B12. 6 each 0   No current facility-administered medications for this visit.     ROS:  See HPI  Physical Exam:  Today's Vitals   02/27/23 1420 02/27/23 1423  BP: 121/70   Pulse: (!) 128   Resp: 16   Temp:  98.3 F (36.8 C)   TempSrc: Temporal   SpO2: 95%   Weight: 130 lb (59 kg)   Height: 5\' 3"  (1.6 m)   PainSc: 8  8   PainLoc: Groin    Body mass index is 23.03 kg/m.   Incision:    Extremities:  left TMA has healed and staples are in place     Assessment/Plan:  This is a 67 y.o. female who is s/p: debridement of left groin wound and placement of wound vac on 02/07/2023 by Dr. Chestine Spore.  She has hx of left greater saphenous vein harvest, left common femoral endarterectomy with saphenous vein patch angioplasty, and left transmetatarsal amputation on 01/11/2023 by Dr. Lenell Antu.  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  Prior to  the surgery she underwent left femoropopliteal angioplasty and stenting on 12/15/2022 by Dr. Lenell Antu.  She also underwent right SFA angioplasty and stenting with DCBA of previous right femoral stent on 12/18/2022 by Dr. Edilia Bo.  She has a history of right popliteal stenting and right anterior tibial angioplasty on 05/19/2021 by Dr. Lenell Antu.  She then had a right great toe and second toe amputation on 06/01/2021 by Dr. Lenell Antu.   -pt doing well.  Her left groin wound is granulating in nicely.  She is switched to wet to dry dressings today.  I did show her husband how to do the dressing changes.  They were given some supplies today.  It is ok for her to shower.  Dr. Lenell Antu removed the staples from her TMA today.   -she has +doppler flow in both feet.   -she will f/u in 3 months with Dr. Lenell Antu with [facility].  They will call sooner if any issues befrore then.     Doreatha Massed, [facility] [phone]   Clinic MD:  Lenell Antu

## 2023-02-27 ENCOUNTER — Encounter: Payer: Self-pay | Admitting: Physician Assistant

## 2023-02-27 ENCOUNTER — Ambulatory Visit (HOSPITAL_COMMUNITY): Payer: PPO

## 2023-02-27 ENCOUNTER — Ambulatory Visit (INDEPENDENT_AMBULATORY_CARE_PROVIDER_SITE_OTHER): Payer: PPO | Admitting: Physician Assistant

## 2023-02-27 VITALS — BP 121/70 | HR 128 | Temp 98.3°F | Resp 16 | Ht 63.0 in | Wt 130.0 lb

## 2023-02-27 DIAGNOSIS — I739 Peripheral vascular disease, unspecified: Secondary | ICD-10-CM

## 2023-02-27 DIAGNOSIS — T8132XD Disruption of internal operation (surgical) wound, not elsewhere classified, subsequent encounter: Secondary | ICD-10-CM

## 2023-03-02 ENCOUNTER — Telehealth: Payer: Self-pay | Admitting: Physician Assistant

## 2023-03-02 ENCOUNTER — Telehealth: Payer: Self-pay

## 2023-03-02 NOTE — Telephone Encounter (Signed)
Karen Kitchens, RN with [facility] called requesting verbal orders for wound care.  Reviewed pt's chart, returned call for clarification, no answer, lf vm.  Bobbie returned call.  Called [place], no answer, lf vm detailing wound care orders.

## 2023-03-02 NOTE — Telephone Encounter (Signed)
Please see message and advise of orders.

## 2023-03-02 NOTE — Telephone Encounter (Signed)
Home Health Verbal Orders  Agency:  [facility]  Caller: Yvonne Kendall, Nurse  [phone]  Requesting OT/ PT/ Skilled nursing/ Social Work/ Speech:  Skilled nursing  Reason for Request:  Pt's wound vac discontinued on 5/29; now doing wet to dry dressing-- new orders to check on patient's wound dressing  Frequency:  Once a week --didn't list how many weeks for  Caller states they need VO for discontiuation of wound vac on 5/29.

## 2023-03-05 ENCOUNTER — Other Ambulatory Visit: Payer: Self-pay

## 2023-03-05 DIAGNOSIS — I739 Peripheral vascular disease, unspecified: Secondary | ICD-10-CM

## 2023-03-05 NOTE — Telephone Encounter (Signed)
Erroneous Encounter

## 2023-03-05 NOTE — Telephone Encounter (Signed)
Left message on voicemail to call office.  

## 2023-03-06 NOTE — Telephone Encounter (Signed)
Spoke to Octa, told her calling back about orders for pt. Told her okay for wound care. Yvonne Kendall said she spoke to someone on Friday from our office. Bobbi asked if we wanted to continue Home Health care also? Told her yes. She asked for how long? Told her what ever they recommend. Bobbi verbalized understanding.

## 2023-03-08 ENCOUNTER — Encounter: Payer: Self-pay | Admitting: Vascular Surgery

## 2023-03-08 ENCOUNTER — Other Ambulatory Visit: Payer: Self-pay | Admitting: Vascular Surgery

## 2023-03-12 ENCOUNTER — Other Ambulatory Visit: Payer: Self-pay | Admitting: Physician Assistant

## 2023-03-12 MED ORDER — OXYCODONE-ACETAMINOPHEN 5-325 MG PO TABS: 1.0000 | ORAL_TABLET | Freq: Four times a day (QID) | ORAL | 0 refills | Status: DC | PRN

## 2023-03-12 NOTE — Progress Notes (Signed)
Pt sent request for pain medication.  PDMP reviewed.    Will send Percocet 5/325 one q6h prn pain #10 no refill.    If pt needs further pain medication, will need referral to pain management.    Doreatha Massed, [facility] 03/12/2023 9:57 AM

## 2023-03-14 ENCOUNTER — Telehealth: Payer: Self-pay | Admitting: Physician Assistant

## 2023-03-14 NOTE — Telephone Encounter (Signed)
Spoke to pt's husband, told him received message from [place] with [facility] who was there today to see pt that she fell yesterday  and  hit her head on the box spring of the bed. Mr. Acord said yes. Told him discussed message with Lelon Mast and her recommendation is that pt go to the ER to be evaluated due to head injury and on blood thinner. Mr An said she is fine. Told him you can not be sure without and evaluation to make sure she does not have a bleed. Asked him if pt is having any headache, dizziness or blurred vision? Mr. Rozak said no. Told him Lelon Mast is recommending ER for evaluation. Mr. Hamidi verbalized understanding.

## 2023-03-14 NOTE — Telephone Encounter (Signed)
Patient fell backwards while trying to get to restroom yesterday. States she hit back of head the box spring of the bed. Currently has no symptoms and doesn't appear to have any significant injuries. Loraine Leriche states he advised patient to use her walker.   For additional info, Loraine Leriche can be reached @ [phone].

## 2023-03-23 ENCOUNTER — Ambulatory Visit: Payer: Self-pay

## 2023-03-23 ENCOUNTER — Encounter: Payer: Self-pay | Admitting: Vascular Surgery

## 2023-03-23 NOTE — Patient Instructions (Signed)
Visit Information  Thank you for taking time to visit with me today. Please don't hesitate to contact me if I can be of assistance to you.   Following are the goals we discussed today:   Goals Addressed             This Visit's Progress    Diabetes management and healing of  left groin wound       Care Coordination Interventions: Evaluation of current treatment plan related to left groin wound and patient's adherence to plan as established by provider Discussed plans with patient for ongoing care management follow up and provided patient with direct contact information for care management team Provided education to patient about basic DM disease process Blood sugars running 100-120 Patient left groin wound just about healed using gauze for protection and comfort.  Home health nurse last visit probably next week.  PT continues.  Patient able to complete most tasks independently.  Discussed pacing self with activity.         Our next appointment is by telephone on 04/20/23 at 1145 am  Please call the care guide team at [phone] if you need to cancel or reschedule your appointment.   If you are experiencing a Mental Health or Behavioral Health Crisis or need someone to talk to, please call the Suicide and Crisis Lifeline: 988   Patient verbalizes understanding of instructions and care plan provided today and agrees to view in MyChart. Active MyChart status and patient understanding of how to access instructions and care plan via MyChart confirmed with patient.     The patient has been provided with contact information for the care management team and has been advised to call with any health related questions or concerns.   Bary Leriche, RN, MSN [facility] Care Management Care Management Coordinator Direct Line [phone]

## 2023-03-23 NOTE — Patient Outreach (Signed)
  Care Coordination   Follow Up Visit Note   03/23/2023 Name: Tammy Mcpherson MRN: [id number] DOB: October 25, 1955  Tammy Mcpherson is a 67 y.o. year old female who sees [facility], [place], [place] for primary care. I  spoke with spouse [name] by phone today. Patient doing well.   What matters to the patients health and wellness today?  Getting stronger    Goals Addressed             This Visit's Progress    Diabetes management and healing of  left groin wound       Care Coordination Interventions: Evaluation of current treatment plan related to left groin wound and patient's adherence to plan as established by provider Discussed plans with patient for ongoing care management follow up and provided patient with direct contact information for care management team Provided education to patient about basic DM disease process Blood sugars running 100-120 Patient left groin wound just about healed using gauze for protection and comfort.  Home health nurse last visit probably next week.  PT continues.  Patient able to complete most tasks independently.  Discussed pacing self with activity.         SDOH assessments and interventions completed:  Yes     Care Coordination Interventions:  Yes, provided   Follow up plan: Follow up call scheduled for July    Encounter Outcome:  Pt. Visit Completed   Bary Leriche, RN, MSN [facility] Care Management Care Management Coordinator Direct Line [phone]

## 2023-03-24 ENCOUNTER — Other Ambulatory Visit: Payer: Self-pay | Admitting: Physician Assistant

## 2023-04-06 ENCOUNTER — Other Ambulatory Visit: Payer: Self-pay | Admitting: Physician Assistant

## 2023-04-06 ENCOUNTER — Encounter: Payer: Self-pay | Admitting: Physician Assistant

## 2023-04-09 MED ORDER — ONETOUCH DELICA PLUS LANCET30G MISC: 3 refills | Status: DC

## 2023-04-09 MED ORDER — ONETOUCH VERIO VI STRP: ORAL_STRIP | 12 refills | Status: DC

## 2023-04-10 ENCOUNTER — Ambulatory Visit: Payer: PPO | Admitting: Gastroenterology

## 2023-04-10 ENCOUNTER — Inpatient Hospital Stay: Payer: PPO

## 2023-04-12 ENCOUNTER — Ambulatory Visit (HOSPITAL_COMMUNITY): Payer: PPO

## 2023-04-17 ENCOUNTER — Inpatient Hospital Stay: Payer: PPO | Admitting: Physician Assistant

## 2023-04-18 ENCOUNTER — Other Ambulatory Visit: Payer: Self-pay

## 2023-05-03 DEATH — deceased

## 2023-05-15 ENCOUNTER — Ambulatory Visit: Payer: PPO | Admitting: Cardiology

## 2023-06-05 ENCOUNTER — Encounter (HOSPITAL_COMMUNITY): Payer: PPO

## 2023-06-05 ENCOUNTER — Ambulatory Visit: Payer: PPO | Admitting: Vascular Surgery
